# Patient Record
Sex: Female | Born: 1965 | Race: Black or African American | Hispanic: No | Marital: Married | Smoking: Former smoker
Health system: Southern US, Community
[De-identification: ages and names within clinical notes are randomized; demographics above are authoritative.]

## PROBLEM LIST (undated history)

## (undated) DIAGNOSIS — K219 Gastro-esophageal reflux disease without esophagitis: Secondary | ICD-10-CM

## (undated) DIAGNOSIS — H548 Legal blindness, as defined in USA: Secondary | ICD-10-CM

## (undated) DIAGNOSIS — C801 Malignant (primary) neoplasm, unspecified: Secondary | ICD-10-CM

## (undated) DIAGNOSIS — Q211 Atrial septal defect, unspecified: Secondary | ICD-10-CM

## (undated) DIAGNOSIS — M5126 Other intervertebral disc displacement, lumbar region: Secondary | ICD-10-CM

## (undated) DIAGNOSIS — M5430 Sciatica, unspecified side: Secondary | ICD-10-CM

## (undated) DIAGNOSIS — R112 Nausea with vomiting, unspecified: Secondary | ICD-10-CM

## (undated) DIAGNOSIS — F419 Anxiety disorder, unspecified: Secondary | ICD-10-CM

## (undated) DIAGNOSIS — G8929 Other chronic pain: Secondary | ICD-10-CM

## (undated) DIAGNOSIS — G43909 Migraine, unspecified, not intractable, without status migrainosus: Secondary | ICD-10-CM

## (undated) DIAGNOSIS — H269 Unspecified cataract: Secondary | ICD-10-CM

## (undated) DIAGNOSIS — R011 Cardiac murmur, unspecified: Secondary | ICD-10-CM

## (undated) DIAGNOSIS — E785 Hyperlipidemia, unspecified: Secondary | ICD-10-CM

## (undated) DIAGNOSIS — Z8719 Personal history of other diseases of the digestive system: Secondary | ICD-10-CM

## (undated) DIAGNOSIS — R519 Headache, unspecified: Secondary | ICD-10-CM

## (undated) DIAGNOSIS — R0600 Dyspnea, unspecified: Secondary | ICD-10-CM

## (undated) DIAGNOSIS — M1990 Unspecified osteoarthritis, unspecified site: Secondary | ICD-10-CM

## (undated) DIAGNOSIS — N189 Chronic kidney disease, unspecified: Secondary | ICD-10-CM

## (undated) DIAGNOSIS — Z9889 Other specified postprocedural states: Secondary | ICD-10-CM

## (undated) DIAGNOSIS — R51 Headache: Secondary | ICD-10-CM

## (undated) HISTORY — PX: CARDIAC CATHETERIZATION: SHX172

## (undated) HISTORY — PX: CATARACT EXTRACTION: SUR2

## (undated) HISTORY — PX: TRANSTHORACIC ECHOCARDIOGRAM: SHX275

## (undated) HISTORY — PX: BUNIONECTOMY: SHX129

## (undated) HISTORY — PX: CLEFT PALATE REPAIR: SUR1165

## (undated) HISTORY — DX: Unspecified cataract: H26.9

## (undated) HISTORY — PX: BREAST SURGERY: SHX581

## (undated) HISTORY — PX: TUBAL LIGATION: SHX77

## (undated) HISTORY — PX: CARDIAC SURGERY: SHX584

## (undated) HISTORY — PX: ABLATION: SHX5711

## (undated) SURGERY — Surgical Case
Anesthesia: *Unknown

---

## 2005-11-24 ENCOUNTER — Other Ambulatory Visit: Admission: RE | Admit: 2005-11-24 | Discharge: 2005-11-24 | Payer: Self-pay | Admitting: Family Medicine

## 2006-10-11 ENCOUNTER — Encounter: Admission: RE | Admit: 2006-10-11 | Discharge: 2006-10-11 | Payer: Self-pay | Admitting: Emergency Medicine

## 2007-12-05 ENCOUNTER — Encounter: Admission: RE | Admit: 2007-12-05 | Discharge: 2007-12-05 | Payer: Self-pay | Admitting: Orthopedic Surgery

## 2009-06-04 ENCOUNTER — Emergency Department (HOSPITAL_COMMUNITY): Admission: EM | Admit: 2009-06-04 | Discharge: 2009-06-04 | Payer: Self-pay | Admitting: Emergency Medicine

## 2010-06-27 ENCOUNTER — Encounter: Payer: Self-pay | Admitting: Emergency Medicine

## 2010-09-06 LAB — COMPREHENSIVE METABOLIC PANEL
ALT: 28 U/L (ref 0–35)
AST: 22 U/L (ref 0–37)
Albumin: 3.9 g/dL (ref 3.5–5.2)
CO2: 29 mEq/L (ref 19–32)
Calcium: 8.5 mg/dL (ref 8.4–10.5)
Chloride: 102 mEq/L (ref 96–112)
GFR calc Af Amer: >60 mL/min (ref >60)
GFR calc non Af Amer: >60 mL/min (ref >60)
Sodium: 138 mEq/L (ref 135–145)
Total Bilirubin: 0.6 mg/dL (ref 0.3–1.2)

## 2010-09-06 LAB — PROTIME-INR: Prothrombin Time: 12.5 seconds (ref 11.6–15.2)

## 2010-09-06 LAB — CBC
HCT: 37.4 % (ref 36.0–46.0)
MCV: 94.6 fL (ref 78.0–100.0)
RBC: 3.96 MIL/uL (ref 3.87–5.11)
WBC: 6.4 10*3/uL (ref 4.0–10.5)

## 2010-09-06 LAB — DIFFERENTIAL
Eosinophils Absolute: 0.1 10*3/uL (ref 0.0–0.7)
Eosinophils Relative: 1 % (ref 0–5)
Lymphs Abs: 2.3 10*3/uL (ref 0.7–4.0)
Monocytes Absolute: 0.3 10*3/uL (ref 0.1–1.0)

## 2011-01-21 ENCOUNTER — Emergency Department (HOSPITAL_COMMUNITY)
Admission: EM | Admit: 2011-01-21 | Discharge: 2011-01-21 | Disposition: A | Discharge status: Home / Self Care | Payer: Self-pay | Attending: Emergency Medicine | Admitting: Emergency Medicine

## 2011-01-21 DIAGNOSIS — H6690 Otitis media, unspecified, unspecified ear: Secondary | ICD-10-CM | POA: Insufficient documentation

## 2011-01-21 DIAGNOSIS — H9209 Otalgia, unspecified ear: Secondary | ICD-10-CM | POA: Insufficient documentation

## 2011-01-21 DIAGNOSIS — H9210 Otorrhea, unspecified ear: Secondary | ICD-10-CM | POA: Insufficient documentation

## 2011-01-21 DIAGNOSIS — H60399 Other infective otitis externa, unspecified ear: Secondary | ICD-10-CM | POA: Insufficient documentation

## 2012-06-05 ENCOUNTER — Other Ambulatory Visit: Payer: Self-pay | Admitting: *Deleted

## 2012-06-05 DIAGNOSIS — Z1231 Encounter for screening mammogram for malignant neoplasm of breast: Secondary | ICD-10-CM

## 2012-06-18 ENCOUNTER — Ambulatory Visit
Admission: RE | Admit: 2012-06-18 | Discharge: 2012-06-18 | Disposition: A | Discharge status: Home / Self Care | Payer: BC Managed Care – PPO | Source: Ambulatory Visit | Attending: *Deleted | Admitting: *Deleted

## 2012-06-18 DIAGNOSIS — Z1231 Encounter for screening mammogram for malignant neoplasm of breast: Secondary | ICD-10-CM

## 2012-10-01 ENCOUNTER — Other Ambulatory Visit: Payer: Self-pay | Admitting: Occupational Medicine

## 2012-10-01 ENCOUNTER — Ambulatory Visit: Payer: Self-pay

## 2012-10-01 DIAGNOSIS — M549 Dorsalgia, unspecified: Secondary | ICD-10-CM

## 2014-07-06 ENCOUNTER — Emergency Department (HOSPITAL_COMMUNITY): Payer: PRIVATE HEALTH INSURANCE

## 2014-07-06 ENCOUNTER — Encounter (HOSPITAL_COMMUNITY): Payer: Self-pay

## 2014-07-06 ENCOUNTER — Emergency Department (HOSPITAL_COMMUNITY)
Admission: EM | Admit: 2014-07-06 | Discharge: 2014-07-06 | Disposition: A | Discharge status: Home / Self Care | Payer: PRIVATE HEALTH INSURANCE | Attending: Emergency Medicine | Admitting: Emergency Medicine

## 2014-07-06 DIAGNOSIS — Y998 Other external cause status: Secondary | ICD-10-CM | POA: Insufficient documentation

## 2014-07-06 DIAGNOSIS — W1839XA Other fall on same level, initial encounter: Secondary | ICD-10-CM | POA: Diagnosis not present

## 2014-07-06 DIAGNOSIS — Y9289 Other specified places as the place of occurrence of the external cause: Secondary | ICD-10-CM | POA: Diagnosis not present

## 2014-07-06 DIAGNOSIS — Y9389 Activity, other specified: Secondary | ICD-10-CM | POA: Diagnosis not present

## 2014-07-06 DIAGNOSIS — Z720 Tobacco use: Secondary | ICD-10-CM | POA: Insufficient documentation

## 2014-07-06 DIAGNOSIS — S8992XA Unspecified injury of left lower leg, initial encounter: Secondary | ICD-10-CM | POA: Diagnosis not present

## 2014-07-06 MED ORDER — HYDROCODONE-ACETAMINOPHEN 5-325 MG PO TABS: 1.0000 | ORAL_TABLET | Freq: Four times a day (QID) | ORAL | Status: DC | PRN

## 2014-07-06 MED ORDER — NAPROXEN 500 MG PO TABS: 500.0000 mg | ORAL_TABLET | Freq: Two times a day (BID) | ORAL | Status: DC

## 2014-07-06 MED ORDER — ONDANSETRON HCL 4 MG PO TABS: 4.0000 mg | ORAL_TABLET | Freq: Four times a day (QID) | ORAL | Status: DC

## 2014-07-06 NOTE — ED Provider Notes (Signed)
CSN: [id number]     Arrival date & time 07/06/14  6010 History   First MD Initiated Contact with Patient 07/06/14 1007     Chief Complaint  Patient presents with  . Knee Pain     (Consider location/radiation/quality/duration/timing/severity/associated sxs/prior Treatment) HPI Comments: 49 y/o F presenting to the ED for evaluation of left knee pain. She slipped on a patch of ice in her driveway on Tuesday and felt a "pop" in the knee which caused her to fall to the ground. She did not hit her head and denies LOC. She was able to ambulate immediately following the fall with pain mainly in the medial aspect of the L knee. She has tried ice and Ibuprofen at home with minimal relief of her symptoms. Pt denies numbness, tingling, pain in the ankle/hip.    Patient is a 49 y.o. female presenting with knee pain. The history is provided by the patient.  Knee Pain   History reviewed. No pertinent past medical history. Past Surgical History  Procedure Laterality Date  . Breast surgery    . Cataract extraction    . Bunionectomy    . Cleft palate repair    . Cardiac surgery    . Ablation     History reviewed. No pertinent family history. History  Substance Use Topics  . Smoking status: Current Every Day Smoker  . Smokeless tobacco: Not on file  . Alcohol Use: No   OB History    No data available     Review of Systems    Allergies  Tramadol and Zithromax  Home Medications   Prior to Admission medications   Not on File   BP 132/75 mmHg  Pulse 104  Temp(Src) 98.3 F (36.8 C) (Oral)  Resp 20  SpO2 99% Physical Exam  ED Course  Procedures (including critical care time) Labs Review Labs Reviewed - No data to display  Imaging Review Dg Knee Complete 4 Views Left  07/06/2014   CLINICAL DATA:  Pain in left knee 6 days after fall; pt states that she fell due to ice on the ground, fell onto her left knee and felt the knee popped; no previous injury; pain on both lateral and  medial sides of the knee, pain radiates to hip and ankle sometime but not much per pt  EXAM: LEFT KNEE - COMPLETE 4+ VIEW  COMPARISON:  None.  FINDINGS: No fracture or dislocation.  There are no significant arthropathic changes.  No joint effusion.  Soft tissues are unremarkable.  IMPRESSION: Negative.   Electronically Signed   By: Lajean Manes M.D.   On: 07/06/2014 10:53     EKG Interpretation None      MDM   Final diagnoses:  Knee injury, left, initial encounter    4:55 PM BP 132/75 mmHg  Pulse 101  Temp(Src) 98.3 F (36.8 C) (Oral)  Resp 20  SpO2 100%  Patient X-Ray negative for obvious fracture or dislocation. Pain managed in ED. Pt advised to follow up with orthopedics if symptoms persist for possibility of missed fracture diagnosis. Patient given brace while in ED, conservative therapy recommended and discussed. Patient will be dc home & is agreeable with above plan.     Margarita Mail, PA-C 07/06/14 [place], MD 07/22/14 [place], MD 07/22/14 [phone]

## 2014-07-06 NOTE — Discharge Instructions (Signed)

## 2014-07-06 NOTE — ED Notes (Signed)
Per pt, was walking on Tuesday.  Felt pop in left knee.  Fell landing on same knee.  Has used ice and ibuprofen since.  Knee continues to hurt with ambulation and any other movement.

## 2014-07-07 ENCOUNTER — Encounter (HOSPITAL_BASED_OUTPATIENT_CLINIC_OR_DEPARTMENT_OTHER): Payer: Self-pay | Admitting: Emergency Medicine

## 2014-10-16 ENCOUNTER — Encounter (HOSPITAL_COMMUNITY): Payer: Self-pay | Admitting: Emergency Medicine

## 2014-10-16 ENCOUNTER — Emergency Department (INDEPENDENT_AMBULATORY_CARE_PROVIDER_SITE_OTHER)
Admission: EM | Admit: 2014-10-16 | Discharge: 2014-10-16 | Disposition: A | Discharge status: Home / Self Care | Payer: PRIVATE HEALTH INSURANCE | Source: Home / Self Care | Attending: Family Medicine | Admitting: Family Medicine

## 2014-10-16 DIAGNOSIS — F322 Major depressive disorder, single episode, severe without psychotic features: Secondary | ICD-10-CM

## 2014-10-16 MED ORDER — SERTRALINE HCL 25 MG PO TABS: 25.0000 mg | ORAL_TABLET | Freq: Every day | ORAL | Status: DC

## 2014-10-16 NOTE — ED Provider Notes (Signed)
Brandy [name] is a 49 y.o. female who presents to Urgent Care today for depression and anxiety. Patient has had worsening depression and anxiety over the past few months. She is reached a breaking point. She notes anxiety and feeling down and flat and worthless. She additionally notes low energy. This has never happened to her before. She denies any family history of bipolar disorder. She denies any SI or HI. She does not have a true primary care provider. No fevers or chills vomiting or diarrhea.   History reviewed. No pertinent past medical history. Past Surgical History  Procedure Laterality Date  . Breast surgery    . Cataract extraction    . Bunionectomy    . Cleft palate repair    . Cardiac surgery    . Ablation     History  Substance Use Topics  . Smoking status: Current Every Day Smoker  . Smokeless tobacco: Not on file  . Alcohol Use: No   ROS as above Medications: No current facility-administered medications for this encounter.   Current Outpatient Prescriptions  Medication Sig Dispense Refill  . HYDROcodone-acetaminophen (NORCO) 5-325 MG per tablet Take 1-2 tablets by mouth every 6 (six) hours as needed for moderate pain. 20 tablet 0  . naproxen (NAPROSYN) 500 MG tablet Take 1 tablet (500 mg total) by mouth 2 (two) times daily with a meal. 30 tablet 0  . ondansetron (ZOFRAN) 4 MG tablet Take 1 tablet (4 mg total) by mouth every 6 (six) hours. 12 tablet 0  . sertraline (ZOLOFT) 25 MG tablet Take 1 tablet (25 mg total) by mouth daily. 30 tablet 0   Allergies  Allergen Reactions  . Tramadol Other (See Comments)    Altered mental status  . Zithromax [Azithromycin]      Exam:  BP 136/76 mmHg  Pulse 66  Temp(Src) 97.6 F (36.4 C) (Oral)  Resp 16  SpO2 100% Gen: Well NAD Psych: Tearful affect. Normal thought process and speech. No SI or HI. No delusions or hallucinations expressed. PHQ 9 24. No suicidal ideation.  No results found for this or any previous  visit (from the past 24 hour(s)). No results found.  Assessment and Plan: 49 y.o. female with depression and anxiety. I scheduled patient with psychiatry for an appointment on June 7. In the meantime we'll start Zoloft 25 mg and increase to 50 mg. Follow-up with me in one week. Discussed crisis line.  Discussed warning signs or symptoms. Please see discharge instructions. Patient expresses understanding.     Gregor Hams, MD 10/16/14 [phone]

## 2014-10-16 NOTE — Discharge Instructions (Signed)
Thank you for coming in today. Start taking Zoloft 1 tablet daily. After few days increase to 2 tablets daily. Return on Wednesday Thursday or Friday for recheck. Come back sooner or go to [facility] behavioral health on [address] or call [phone] if you get worse.    Major Depressive Disorder Major depressive disorder is a mental illness. It also may be called clinical depression or unipolar depression. Major depressive disorder usually causes feelings of sadness, hopelessness, or helplessness. Some people with this disorder do not feel particularly sad but lose interest in doing things they used to enjoy (anhedonia). Major depressive disorder also can cause physical symptoms. It can interfere with work, school, relationships, and other normal everyday activities. The disorder varies in severity but is longer lasting and more serious than the sadness we all feel from time to time in our lives. Major depressive disorder often is triggered by stressful life events or major life changes. Examples of these triggers include divorce, loss of your job or home, a move, and the death of a family member or close friend. Sometimes this disorder occurs for no obvious reason at all. People who have family members with major depressive disorder or bipolar disorder are at higher risk for developing this disorder, with or without life stressors. Major depressive disorder can occur at any age. It may occur just once in your life (single episode major depressive disorder). It may occur multiple times (recurrent major depressive disorder). SYMPTOMS People with major depressive disorder have either anhedonia or depressed mood on nearly a daily basis for at least 2 weeks or longer. Symptoms of depressed mood include:  Feelings of sadness (blue or down in the dumps) or emptiness.  Feelings of hopelessness or helplessness.  Tearfulness or episodes of crying (may be observed by others).  Irritability  (children and adolescents). In addition to depressed mood or anhedonia or both, people with this disorder have at least four of the following symptoms:  Difficulty sleeping or sleeping too much.   Significant change (increase or decrease) in appetite or weight.   Lack of energy or motivation.  Feelings of guilt and worthlessness.   Difficulty concentrating, remembering, or making decisions.  Unusually slow movement (psychomotor retardation) or restlessness (as observed by others).   Recurrent wishes for death, recurrent thoughts of self-harm (suicide), or a suicide attempt. People with major depressive disorder commonly have persistent negative thoughts about themselves, other people, and the world. People with severe major depressive disorder may experiencedistorted beliefs or perceptions about the world (psychotic delusions). They also may see or hear things that are not real (psychotic hallucinations). DIAGNOSIS Major depressive disorder is diagnosed through an assessment by your health care provider. Your health care provider will ask aboutaspects of your daily life, such as mood,sleep, and appetite, to see if you have the diagnostic symptoms of major depressive disorder. Your health care provider may ask about your medical history and use of alcohol or drugs, including prescription medicines. Your health care provider also may do a physical exam and blood work. This is because certain medical conditions and the use of certain substances can cause major depressive disorder-like symptoms (secondary depression). Your health care provider also may refer you to a mental health specialist for further evaluation and treatment. TREATMENT It is important to recognize the symptoms of major depressive disorder and seek treatment. The following treatments can be prescribed for this disorder:   Medicine. Antidepressant medicines usually are prescribed. Antidepressant medicines are thought to  correct chemical imbalances in the brain that are commonly associated with major depressive disorder. Other types of medicine may be added if the symptoms do not respond to antidepressant medicines alone or if psychotic delusions or hallucinations occur.  Talk therapy. Talk therapy can be helpful in treating major depressive disorder by providing support, education, and guidance. Certain types of talk therapy also can help with negative thinking (cognitive behavioral therapy) and with relationship issues that trigger this disorder (interpersonal therapy). A mental health specialist can help determine which treatment is best for you. Most people with major depressive disorder do well with a combination of medicine and talk therapy. Treatments involving electrical stimulation of the brain can be used in situations with extremely severe symptoms or when medicine and talk therapy do not work over time. These treatments include electroconvulsive therapy, transcranial magnetic stimulation, and vagal nerve stimulation. Document Released: 09/17/2012 Document Revised: 10/07/2013 Document Reviewed: 09/17/2012 [facility] Patient Information 2015 [place], [place]. This information is not intended to replace advice given to you by your health care provider. Make sure you discuss any questions you have with your health care provider.

## 2014-10-16 NOTE — ED Notes (Signed)
C/o depression States she has been depress for months No tx tried Patient did cry trying to explain problems

## 2014-10-22 ENCOUNTER — Encounter (HOSPITAL_COMMUNITY): Payer: Self-pay

## 2014-10-22 ENCOUNTER — Emergency Department (INDEPENDENT_AMBULATORY_CARE_PROVIDER_SITE_OTHER)
Admission: EM | Admit: 2014-10-22 | Discharge: 2014-10-22 | Disposition: A | Discharge status: Home / Self Care | Payer: PRIVATE HEALTH INSURANCE | Source: Home / Self Care | Attending: Family Medicine | Admitting: Family Medicine

## 2014-10-22 DIAGNOSIS — F322 Major depressive disorder, single episode, severe without psychotic features: Secondary | ICD-10-CM | POA: Diagnosis not present

## 2014-10-22 MED ORDER — SERTRALINE HCL 100 MG PO TABS: 100.0000 mg | ORAL_TABLET | Freq: Every day | ORAL | Status: DC
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
## 2014-10-22 NOTE — Discharge Instructions (Signed)
Thank you for coming in today. Start taking 100mg  zoloft daily.  Follow up with your doctor or myself on or before Wednesday May 25th.  Return sooner if needed.  Go to the ER or call 911 if you feel like hurting yourself or others.    Major Depressive Disorder Major depressive disorder is a mental illness. It also may be called clinical depression or unipolar depression. Major depressive disorder usually causes feelings of sadness, hopelessness, or helplessness. Some people with this disorder do not feel particularly sad but lose interest in doing things they used to enjoy (anhedonia). Major depressive disorder also can cause physical symptoms. It can interfere with work, school, relationships, and other normal everyday activities. The disorder varies in severity but is longer lasting and more serious than the sadness we all feel from time to time in our lives. Major depressive disorder often is triggered by stressful life events or major life changes. Examples of these triggers include divorce, loss of your job or home, a move, and the death of a family member or close friend. Sometimes this disorder occurs for no obvious reason at all. People who have family members with major depressive disorder or bipolar disorder are at higher risk for developing this disorder, with or without life stressors. Major depressive disorder can occur at any age. It may occur just once in your life (single episode major depressive disorder). It may occur multiple times (recurrent major depressive disorder). SYMPTOMS People with major depressive disorder have either anhedonia or depressed mood on nearly a daily basis for at least 2 weeks or longer. Symptoms of depressed mood include:  Feelings of sadness (blue or down in the dumps) or emptiness.  Feelings of hopelessness or helplessness.  Tearfulness or episodes of crying (may be observed by others).  Irritability (children and adolescents). In addition to depressed  mood or anhedonia or both, people with this disorder have at least four of the following symptoms:  Difficulty sleeping or sleeping too much.   Significant change (increase or decrease) in appetite or weight.   Lack of energy or motivation.  Feelings of guilt and worthlessness.   Difficulty concentrating, remembering, or making decisions.  Unusually slow movement (psychomotor retardation) or restlessness (as observed by others).   Recurrent wishes for death, recurrent thoughts of self-harm (suicide), or a suicide attempt. People with major depressive disorder commonly have persistent negative thoughts about themselves, other people, and the world. People with severe major depressive disorder may experiencedistorted beliefs or perceptions about the world (psychotic delusions). They also may see or hear things that are not real (psychotic hallucinations). DIAGNOSIS Major depressive disorder is diagnosed through an assessment by your health care provider. Your health care provider will ask aboutaspects of your daily life, such as mood,sleep, and appetite, to see if you have the diagnostic symptoms of major depressive disorder. Your health care provider may ask about your medical history and use of alcohol or drugs, including prescription medicines. Your health care provider also may do a physical exam and blood work. This is because certain medical conditions and the use of certain substances can cause major depressive disorder-like symptoms (secondary depression). Your health care provider also may refer you to a mental health specialist for further evaluation and treatment. TREATMENT It is important to recognize the symptoms of major depressive disorder and seek treatment. The following treatments can be prescribed for this disorder:   Medicine. Antidepressant medicines usually are prescribed. Antidepressant medicines are thought to correct chemical imbalances in the  brain that are  commonly associated with major depressive disorder. Other types of medicine may be added if the symptoms do not respond to antidepressant medicines alone or if psychotic delusions or hallucinations occur.  Talk therapy. Talk therapy can be helpful in treating major depressive disorder by providing support, education, and guidance. Certain types of talk therapy also can help with negative thinking (cognitive behavioral therapy) and with relationship issues that trigger this disorder (interpersonal therapy). A mental health specialist can help determine which treatment is best for you. Most people with major depressive disorder do well with a combination of medicine and talk therapy. Treatments involving electrical stimulation of the brain can be used in situations with extremely severe symptoms or when medicine and talk therapy do not work over time. These treatments include electroconvulsive therapy, transcranial magnetic stimulation, and vagal nerve stimulation. Document Released: 09/17/2012 Document Revised: 10/07/2013 Document Reviewed: 09/17/2012 [facility] Patient Information 2015 [place], [place]. This information is not intended to replace advice given to you by your health care provider. Make sure you discuss any questions you have with your health care provider.

## 2014-10-31 ENCOUNTER — Telehealth (HOSPITAL_COMMUNITY): Payer: Self-pay | Admitting: Family Medicine

## 2014-10-31 NOTE — ED Notes (Signed)
Short Term Disability Form filled out.  Left at front desk.  I left a message for the patient asking her to pick up the form.   Gregor Hams, MD 10/31/14 [phone]

## 2014-11-11 ENCOUNTER — Telehealth (HOSPITAL_COMMUNITY): Payer: Self-pay

## 2014-11-11 ENCOUNTER — Ambulatory Visit (HOSPITAL_COMMUNITY): Payer: Self-pay | Admitting: Psychiatry

## 2014-11-11 NOTE — Telephone Encounter (Signed)
Patient came in late, was not able to be seen.  Told patient it would be August before she can get in.  She stated "she was not waiting til august to be seen" and left.

## 2014-11-20 ENCOUNTER — Encounter (HOSPITAL_COMMUNITY): Payer: Self-pay | Admitting: Emergency Medicine

## 2014-11-20 ENCOUNTER — Emergency Department (INDEPENDENT_AMBULATORY_CARE_PROVIDER_SITE_OTHER)
Admission: EM | Admit: 2014-11-20 | Discharge: 2014-11-20 | Disposition: A | Discharge status: Home / Self Care | Payer: Self-pay | Source: Home / Self Care

## 2014-11-20 DIAGNOSIS — F329 Major depressive disorder, single episode, unspecified: Secondary | ICD-10-CM

## 2014-11-20 DIAGNOSIS — F32A Depression, unspecified: Secondary | ICD-10-CM

## 2014-11-20 NOTE — ED Notes (Signed)
Pt is here to follow up on her depression and talk about depression medications

## 2014-11-20 NOTE — ED Provider Notes (Signed)
CSN: [id number]     Arrival date & time 11/20/14  1737 History   None    Chief Complaint  Patient presents with  . Follow-up   (Consider location/radiation/quality/duration/timing/severity/associated sxs/prior Treatment) HPI Comments: Patient presents today to obtain office notes from Dr. Clovis Riley 2 past office visit. Her employer did not approve FMLA because of notes. Her depression is better, but she would like to come off Zoloft and do "something else". She has no suicidal tendencies or reports no harm to others. She was late to Psych appt and they would not see her. She is going to find somewhere else to go.   The history is provided by the patient.    History reviewed. No pertinent past medical history. Past Surgical History  Procedure Laterality Date  . Breast surgery    . Cataract extraction    . Bunionectomy    . Cleft palate repair    . Cardiac surgery    . Ablation     History reviewed. No pertinent family history. History  Substance Use Topics  . Smoking status: Current Every Day Smoker  . Smokeless tobacco: Not on file  . Alcohol Use: No   OB History    No data available     Review of Systems  All other systems reviewed and are negative.   Allergies  Tramadol and Zithromax  Home Medications   Prior to Admission medications   Medication Sig Start Date End Date Taking? Authorizing Provider  sertraline (ZOLOFT) 100 MG tablet Take 1 tablet (100 mg total) by mouth daily. Patient taking differently: Take 100 mg by mouth 2 (two) times daily.  10/22/14   Gregor Hams, MD   BP 123/80 mmHg  Pulse 99  Temp(Src) 98.1 F (36.7 C) (Oral)  Resp 16  SpO2 97% Physical Exam  Constitutional: She appears well-developed and well-nourished.  Smiling today  Psychiatric: Her behavior is normal.  Nursing note and vitals reviewed.   ED Course  Procedures (including critical care time) Labs Review Labs Reviewed - No data to display  Imaging Review No results  found.   MDM   1. Depression    Improved yet wants to try something different. Explained she will need a regular PCP or psych to do this as she must be tapered off Zoloft and this needs to be followed. Otherwise I did provide her with Dr. Clovis Riley' last office notes. In my basic assesment she appears happy and not a threat to herself or others.     Bjorn Pippin, PA-C 11/20/14 [phone]

## 2014-11-20 NOTE — Discharge Instructions (Signed)

## 2015-01-07 DIAGNOSIS — F419 Anxiety disorder, unspecified: Secondary | ICD-10-CM | POA: Insufficient documentation

## 2015-02-13 ENCOUNTER — Encounter (HOSPITAL_COMMUNITY): Payer: Self-pay | Admitting: Emergency Medicine

## 2015-02-13 ENCOUNTER — Emergency Department (INDEPENDENT_AMBULATORY_CARE_PROVIDER_SITE_OTHER)
Admission: EM | Admit: 2015-02-13 | Discharge: 2015-02-13 | Disposition: A | Discharge status: Home / Self Care | Payer: Self-pay | Source: Home / Self Care | Attending: Family Medicine | Admitting: Family Medicine

## 2015-02-13 DIAGNOSIS — M62838 Other muscle spasm: Secondary | ICD-10-CM

## 2015-02-13 DIAGNOSIS — M5431 Sciatica, right side: Secondary | ICD-10-CM

## 2015-02-13 MED ORDER — DEXAMETHASONE 4 MG PO TABS
ORAL_TABLET | ORAL | Status: AC
  Filled 2015-02-13: qty 2

## 2015-02-13 MED ORDER — DICLOFENAC SODIUM 75 MG PO TBEC: 75.0000 mg | DELAYED_RELEASE_TABLET | Freq: Two times a day (BID) | ORAL | Status: DC

## 2015-02-13 MED ORDER — METHOCARBAMOL 500 MG PO TABS: 500.0000 mg | ORAL_TABLET | Freq: Four times a day (QID) | ORAL | Status: DC | PRN

## 2015-02-13 MED ORDER — DEXAMETHASONE 2 MG PO TABS
ORAL_TABLET | ORAL | Status: AC
  Filled 2015-02-13: qty 1

## 2015-02-13 MED ORDER — DEXAMETHASONE 4 MG PO TABS
10.0000 mg | ORAL_TABLET | Freq: Once | ORAL | Status: AC
  Administered 2015-02-13: 10 mg via ORAL

## 2015-02-13 MED ORDER — METHYLPREDNISOLONE 4 MG PO TBPK: ORAL_TABLET | ORAL | Status: DC

## 2015-02-13 NOTE — ED Notes (Signed)
C/o sciatic pain flare up on right side onset 2-3 weeks after sustaining multiple falls Pain increases w/activity Alert... No acute distress.

## 2015-02-13 NOTE — Discharge Instructions (Signed)
You have sciatica You were given a dose of steroid to help with your symptoms Please continue the steroids and start the exercises Please follow up with orthopedic surgery as needed Please start the muscle relaxer and voltaren for additional relief.   Sciatica with Rehab The sciatic nerve runs from the back down the leg and is responsible for sensation and control of the muscles in the back (posterior) side of the thigh, lower leg, and foot. Sciatica is a condition that is characterized by inflammation of this nerve.  SYMPTOMS   Signs of nerve damage, including numbness and/or weakness along the posterior side of the lower extremity.  Pain in the back of the thigh that may also travel down the leg.  Pain that worsens when sitting for long periods of time.  Occasionally, pain in the back or buttock. CAUSES  Inflammation of the sciatic nerve is the cause of sciatica. The inflammation is due to something irritating the nerve. Common sources of irritation include:  Sitting for long periods of time.  Direct trauma to the nerve.  Arthritis of the spine.  Herniated or ruptured disk.  Slipping of the vertebrae (spondylolisthesis).  Pressure from soft tissues, such as muscles or ligament-like tissue (fascia). RISK INCREASES WITH:  Sports that place pressure or stress on the spine (football or weightlifting).  Poor strength and flexibility.  Failure to warm up properly before activity.  Family history of low back pain or disk disorders.  Previous back injury or surgery.  Poor body mechanics, especially when lifting, or poor posture. PREVENTION   Warm up and stretch properly before activity.  Maintain physical fitness:  Strength, flexibility, and endurance.  Cardiovascular fitness.  Learn and use proper technique, especially with posture and lifting. When possible, have coach correct improper technique.  Avoid activities that place stress on the spine. PROGNOSIS If  treated properly, then sciatica usually resolves within 6 weeks. However, occasionally surgery is necessary.  RELATED COMPLICATIONS   Permanent nerve damage, including pain, numbness, tingle, or weakness.  Chronic back pain.  Risks of surgery: infection, bleeding, nerve damage, or damage to surrounding tissues. TREATMENT Treatment initially involves resting from any activities that aggravate your symptoms. The use of ice and medication may help reduce pain and inflammation. The use of strengthening and stretching exercises may help reduce pain with activity. These exercises may be performed at home or with referral to a therapist. A therapist may recommend further treatments, such as transcutaneous electronic nerve stimulation (TENS) or ultrasound. Your caregiver may recommend corticosteroid injections to help reduce inflammation of the sciatic nerve. If symptoms persist despite non-surgical (conservative) treatment, then surgery may be recommended. MEDICATION  If pain medication is necessary, then nonsteroidal anti-inflammatory medications, such as aspirin and ibuprofen, or other minor pain relievers, such as acetaminophen, are often recommended.  Do not take pain medication for 7 days before surgery.  Prescription pain relievers may be given if deemed necessary by your caregiver. Use only as directed and only as much as you need.  Ointments applied to the skin may be helpful.  Corticosteroid injections may be given by your caregiver. These injections should be reserved for the most serious cases, because they may only be given a certain number of times. HEAT AND COLD  Cold treatment (icing) relieves pain and reduces inflammation. Cold treatment should be applied for 10 to 15 minutes every 2 to 3 hours for inflammation and pain and immediately after any activity that aggravates your symptoms. Use ice packs or massage  the area with a piece of ice (ice massage).  Heat treatment may be used  prior to performing the stretching and strengthening activities prescribed by your caregiver, physical therapist, or athletic trainer. Use a heat pack or soak the injury in warm water. SEEK MEDICAL CARE IF:  Treatment seems to offer no benefit, or the condition worsens.  Any medications produce adverse side effects. EXERCISES  RANGE OF MOTION (ROM) AND STRETCHING EXERCISES - Sciatica Most people with sciatic will find that their symptoms worsen with either excessive bending forward (flexion) or arching at the low back (extension). The exercises which will help resolve your symptoms will focus on the opposite motion. Your physician, physical therapist or athletic trainer will help you determine which exercises will be most helpful to resolve your low back pain. Do not complete any exercises without first consulting with your clinician. Discontinue any exercises which worsen your symptoms until you speak to your clinician. If you have pain, numbness or tingling which travels down into your buttocks, leg or foot, the goal of the therapy is for these symptoms to move closer to your back and eventually resolve. Occasionally, these leg symptoms will get better, but your low back pain may worsen; this is typically an indication of progress in your rehabilitation. Be certain to be very alert to any changes in your symptoms and the activities in which you participated in the 24 hours prior to the change. Sharing this information with your clinician will allow him/her to most efficiently treat your condition. These exercises may help you when beginning to rehabilitate your injury. Your symptoms may resolve with or without further involvement from your physician, physical therapist or athletic trainer. While completing these exercises, remember:   Restoring tissue flexibility helps normal motion to return to the joints. This allows healthier, less painful movement and activity.  An effective stretch should be held  for at least 30 seconds.  A stretch should never be painful. You should only feel a gentle lengthening or release in the stretched tissue. FLEXION RANGE OF MOTION AND STRETCHING EXERCISES: STRETCH - Flexion, Single Knee to Chest   Lie on a firm bed or floor with both legs extended in front of you.  Keeping one leg in contact with the floor, bring your opposite knee to your chest. Hold your leg in place by either grabbing behind your thigh or at your knee.  Pull until you feel a gentle stretch in your low back. Hold __________ seconds.  Slowly release your grasp and repeat the exercise with the opposite side. Repeat __________ times. Complete this exercise __________ times per day.  STRETCH - Flexion, Double Knee to Chest  Lie on a firm bed or floor with both legs extended in front of you.  Keeping one leg in contact with the floor, bring your opposite knee to your chest.  Tense your stomach muscles to support your back and then lift your other knee to your chest. Hold your legs in place by either grabbing behind your thighs or at your knees.  Pull both knees toward your chest until you feel a gentle stretch in your low back. Hold __________ seconds.  Tense your stomach muscles and slowly return one leg at a time to the floor. Repeat __________ times. Complete this exercise __________ times per day.  STRETCH - Low Trunk Rotation   Lie on a firm bed or floor. Keeping your legs in front of you, bend your knees so they are both pointed toward the ceiling  and your feet are flat on the floor.  Extend your arms out to the side. This will stabilize your upper body by keeping your shoulders in contact with the floor.  Gently and slowly drop both knees together to one side until you feel a gentle stretch in your low back. Hold for __________ seconds.  Tense your stomach muscles to support your low back as you bring your knees back to the starting position. Repeat the exercise to the other  side. Repeat __________ times. Complete this exercise __________ times per day  EXTENSION RANGE OF MOTION AND FLEXIBILITY EXERCISES: STRETCH - Extension, Prone on Elbows  Lie on your stomach on the floor, a bed will be too soft. Place your palms about shoulder width apart and at the height of your head.  Place your elbows under your shoulders. If this is too painful, stack pillows under your chest.  Allow your body to relax so that your hips drop lower and make contact more completely with the floor.  Hold this position for __________ seconds.  Slowly return to lying flat on the floor. Repeat __________ times. Complete this exercise __________ times per day.  RANGE OF MOTION - Extension, Prone Press Ups  Lie on your stomach on the floor, a bed will be too soft. Place your palms about shoulder width apart and at the height of your head.  Keeping your back as relaxed as possible, slowly straighten your elbows while keeping your hips on the floor. You may adjust the placement of your hands to maximize your comfort. As you gain motion, your hands will come more underneath your shoulders.  Hold this position __________ seconds.  Slowly return to lying flat on the floor. Repeat __________ times. Complete this exercise __________ times per day.  STRENGTHENING EXERCISES - Sciatica  These exercises may help you when beginning to rehabilitate your injury. These exercises should be done near your "sweet spot." This is the neutral, low-back arch, somewhere between fully rounded and fully arched, that is your least painful position. When performed in this safe range of motion, these exercises can be used for people who have either a flexion or extension based injury. These exercises may resolve your symptoms with or without further involvement from your physician, physical therapist or athletic trainer. While completing these exercises, remember:   Muscles can gain both the endurance and the strength  needed for everyday activities through controlled exercises.  Complete these exercises as instructed by your physician, physical therapist or athletic trainer. Progress with the resistance and repetition exercises only as your caregiver advises.  You may experience muscle soreness or fatigue, but the pain or discomfort you are trying to eliminate should never worsen during these exercises. If this pain does worsen, stop and make certain you are following the directions exactly. If the pain is still present after adjustments, discontinue the exercise until you can discuss the trouble with your clinician. STRENGTHENING - Deep Abdominals, Pelvic Tilt   Lie on a firm bed or floor. Keeping your legs in front of you, bend your knees so they are both pointed toward the ceiling and your feet are flat on the floor.  Tense your lower abdominal muscles to press your low back into the floor. This motion will rotate your pelvis so that your tail bone is scooping upwards rather than pointing at your feet or into the floor.  With a gentle tension and even breathing, hold this position for __________ seconds. Repeat __________ times. Complete this exercise  __________ times per day.  STRENGTHENING - Abdominals, Crunches   Lie on a firm bed or floor. Keeping your legs in front of you, bend your knees so they are both pointed toward the ceiling and your feet are flat on the floor. Cross your arms over your chest.  Slightly tip your chin down without bending your neck.  Tense your abdominals and slowly lift your trunk high enough to just clear your shoulder blades. Lifting higher can put excessive stress on the low back and does not further strengthen your abdominal muscles.  Control your return to the starting position. Repeat __________ times. Complete this exercise __________ times per day.  STRENGTHENING - Quadruped, Opposite UE/LE Lift  Assume a hands and knees position on a firm surface. Keep your hands  under your shoulders and your knees under your hips. You may place padding under your knees for comfort.  Find your neutral spine and gently tense your abdominal muscles so that you can maintain this position. Your shoulders and hips should form a rectangle that is parallel with the floor and is not twisted.  Keeping your trunk steady, lift your right hand no higher than your shoulder and then your left leg no higher than your hip. Make sure you are not holding your breath. Hold this position __________ seconds.  Continuing to keep your abdominal muscles tense and your back steady, slowly return to your starting position. Repeat with the opposite arm and leg. Repeat __________ times. Complete this exercise __________ times per day.  STRENGTHENING - Abdominals and Quadriceps, Straight Leg Raise   Lie on a firm bed or floor with both legs extended in front of you.  Keeping one leg in contact with the floor, bend the other knee so that your foot can rest flat on the floor.  Find your neutral spine, and tense your abdominal muscles to maintain your spinal position throughout the exercise.  Slowly lift your straight leg off the floor about 6 inches for a count of 15, making sure to not hold your breath.  Still keeping your neutral spine, slowly lower your leg all the way to the floor. Repeat this exercise with each leg __________ times. Complete this exercise __________ times per day. POSTURE AND BODY MECHANICS CONSIDERATIONS - Sciatica Keeping correct posture when sitting, standing or completing your activities will reduce the stress put on different body tissues, allowing injured tissues a chance to heal and limiting painful experiences. The following are general guidelines for improved posture. Your physician or physical therapist will provide you with any instructions specific to your needs. While reading these guidelines, remember:  The exercises prescribed by your provider will help you have  the flexibility and strength to maintain correct postures.  The correct posture provides the optimal environment for your joints to work. All of your joints have less wear and tear when properly supported by a spine with good posture. This means you will experience a healthier, less painful body.  Correct posture must be practiced with all of your activities, especially prolonged sitting and standing. Correct posture is as important when doing repetitive low-stress activities (typing) as it is when doing a single heavy-load activity (lifting). RESTING POSITIONS Consider which positions are most painful for you when choosing a resting position. If you have pain with flexion-based activities (sitting, bending, stooping, squatting), choose a position that allows you to rest in a less flexed posture. You would want to avoid curling into a fetal position on your side. If your  pain worsens with extension-based activities (prolonged standing, working overhead), avoid resting in an extended position such as sleeping on your stomach. Most people will find more comfort when they rest with their spine in a more neutral position, neither too rounded nor too arched. Lying on a non-sagging bed on your side with a pillow between your knees, or on your back with a pillow under your knees will often provide some relief. Keep in mind, being in any one position for a prolonged period of time, no matter how correct your posture, can still lead to stiffness. PROPER SITTING POSTURE In order to minimize stress and discomfort on your spine, you must sit with correct posture Sitting with good posture should be effortless for a healthy body. Returning to good posture is a gradual process. Many people can work toward this most comfortably by using various supports until they have the flexibility and strength to maintain this posture on their own. When sitting with proper posture, your ears will fall over your shoulders and your  shoulders will fall over your hips. You should use the back of the chair to support your upper back. Your low back will be in a neutral position, just slightly arched. You may place a small pillow or folded towel at the base of your low back for support.  When working at a desk, create an environment that supports good, upright posture. Without extra support, muscles fatigue and lead to excessive strain on joints and other tissues. Keep these recommendations in mind: CHAIR:   A chair should be able to slide under your desk when your back makes contact with the back of the chair. This allows you to work closely.  The chair's height should allow your eyes to be level with the upper part of your monitor and your hands to be slightly lower than your elbows. BODY POSITION  Your feet should make contact with the floor. If this is not possible, use a foot rest.  Keep your ears over your shoulders. This will reduce stress on your neck and low back. INCORRECT SITTING POSTURES   If you are feeling tired and unable to assume a healthy sitting posture, do not slouch or slump. This puts excessive strain on your back tissues, causing more damage and pain. Healthier options include:  Using more support, like a lumbar pillow.  Switching tasks to something that requires you to be upright or walking.  Talking a brief walk.  Lying down to rest in a neutral-spine position. PROLONGED STANDING WHILE SLIGHTLY LEANING FORWARD  When completing a task that requires you to lean forward while standing in one place for a long time, place either foot up on a stationary 2-4 inch high object to help maintain the best posture. When both feet are on the ground, the low back tends to lose its slight inward curve. If this curve flattens (or becomes too large), then the back and your other joints will experience too much stress, fatigue more quickly and can cause pain.  CORRECT STANDING POSTURES Proper standing posture should  be assumed with all daily activities, even if they only take a few moments, like when brushing your teeth. As in sitting, your ears should fall over your shoulders and your shoulders should fall over your hips. You should keep a slight tension in your abdominal muscles to brace your spine. Your tailbone should point down to the ground, not behind your body, resulting in an over-extended swayback posture.  INCORRECT STANDING POSTURES  Common  incorrect standing postures include a forward head, locked knees and/or an excessive swayback. WALKING Walk with an upright posture. Your ears, shoulders and hips should all line-up. PROLONGED ACTIVITY IN A FLEXED POSITION When completing a task that requires you to bend forward at your waist or lean over a low surface, try to find a way to stabilize 3 of 4 of your limbs. You can place a hand or elbow on your thigh or rest a knee on the surface you are reaching across. This will provide you more stability so that your muscles do not fatigue as quickly. By keeping your knees relaxed, or slightly bent, you will also reduce stress across your low back. CORRECT LIFTING TECHNIQUES DO :   Assume a wide stance. This will provide you more stability and the opportunity to get as close as possible to the object which you are lifting.  Tense your abdominals to brace your spine; then bend at the knees and hips. Keeping your back locked in a neutral-spine position, lift using your leg muscles. Lift with your legs, keeping your back straight.  Test the weight of unknown objects before attempting to lift them.  Try to keep your elbows locked down at your sides in order get the best strength from your shoulders when carrying an object.  Always ask for help when lifting heavy or awkward objects. INCORRECT LIFTING TECHNIQUES DO NOT:   Lock your knees when lifting, even if it is a small object.  Bend and twist. Pivot at your feet or move your feet when needing to change  directions.  Assume that you cannot safely pick up a paperclip without proper posture. Document Released: 05/23/2005 Document Revised: 10/07/2013 Document Reviewed: 09/04/2008 [facility] Patient Information 2015 [place], [place]. This information is not intended to replace advice given to you by your health care provider. Make sure you discuss any questions you have with your health care provider.

## 2015-02-13 NOTE — ED Provider Notes (Signed)
CSN: [id number]     Arrival date & time 02/13/15  1543 History   First MD Initiated Contact with Patient 02/13/15 1610     Chief Complaint  Patient presents with  . Back Pain   (Consider location/radiation/quality/duration/timing/severity/associated sxs/prior Treatment) HPI  Right lower back pain. Started approximately 3 weeks ago as more of a dull ache after patient tried cleaning out her chest freezer and had to throw away a large amount of bad food. She states that she had a hard time lifting the bag of food and had the chest. Overall her. She is used extra strength Tylenol without significant improvement. There some radiation down the right leg but has been fairly manageable until today when patient states that she was picking up a heavy child/family member when she felt her lower back become acutely more painful with radiation down the right leg. Denies any saddle anesthesia, loss of bowel or bladder function, fevers, nausea, vomiting, dull pain, constipation. Pain is constant and worse with certain movements. Heat without improvement.  History reviewed. No pertinent past medical history. Past Surgical History  Procedure Laterality Date  . Breast surgery    . Cataract extraction    . Bunionectomy    . Cleft palate repair    . Cardiac surgery    . Ablation     No family history on file. Social History  Substance Use Topics  . Smoking status: Current Every Day Smoker  . Smokeless tobacco: None  . Alcohol Use: No   OB History    No data available     Review of Systems Per HPI with all other pertinent systems negative.   Allergies  Tramadol and Zithromax  Home Medications   Prior to Admission medications   Medication Sig Start Date End Date Taking? Authorizing Provider  diclofenac (VOLTAREN) 75 MG EC tablet Take 1 tablet (75 mg total) by mouth 2 (two) times daily. 02/13/15   Waldemar Dickens, MD  methocarbamol (ROBAXIN) 500 MG tablet Take 1-2 tablets (500-1,000 mg total) by  mouth every 6 (six) hours as needed for muscle spasms. 02/13/15   Waldemar Dickens, MD  methylPREDNISolone (MEDROL DOSEPAK) 4 MG TBPK tablet Take as prescribed 02/13/15   Waldemar Dickens, MD  sertraline (ZOLOFT) 100 MG tablet Take 1 tablet (100 mg total) by mouth daily. Patient taking differently: Take 100 mg by mouth 2 (two) times daily.  10/22/14   Gregor Hams, MD   Meds Ordered and Administered this Visit   Medications  dexamethasone (DECADRON) tablet 10 mg (not administered)    BP 124/80 mmHg  Pulse 89  Temp(Src) 98.1 F (36.7 C) (Oral)  Resp 16  SpO2 97% No data found.   Physical Exam Physical Exam  Constitutional: oriented to person, place, and time. appears well-developed and well-nourished. No distress.  HENT:  Head: Normocephalic and atraumatic.  Eyes: EOMI. PERRL.  Neck: Normal range of motion.  Cardiovascular: RRR, no m/r/g, 2+ distal pulses,  Pulmonary/Chest: Effort normal and breath sounds normal. No respiratory distress.  Abdominal: Soft. Bowel sounds are normal. NonTTP, no distension.  Musculoskeletal: Patient with limited range of motion of back due to complaints of pain. Mild soft tissue tenderness in the right lower act to upper gluteal region.  Neurological: alert and oriented to person, place, and time.  Skin: Skin is warm. No rash noted. non diaphoretic.  Psychiatric: normal mood and affect. behavior is normal. Judgment and thought content normal.   ED Course  Procedures (including critical  care time)  Labs Review Labs Reviewed - No data to display  Imaging Review No results found.   Visual Acuity Review  Right Eye Distance:   Left Eye Distance:   Bilateral Distance:    Right Eye Near:   Left Eye Near:    Bilateral Near:         MDM   1. Sciatica, right   2. Muscle spasm    Decadron 10 mg by mouth given in clinic. Start Voltaren after finishing steroids. Medrol Dosepak prescribed. Robaxin for additional muscle strain relief. Robaxin for  muscle strain. Start sciatica exercises. Go to ED if gets worse.    Waldemar Dickens, MD 02/13/15 [phone]

## 2015-02-22 ENCOUNTER — Encounter (HOSPITAL_COMMUNITY): Payer: Self-pay | Admitting: Emergency Medicine

## 2015-02-22 ENCOUNTER — Emergency Department (HOSPITAL_COMMUNITY)
Admission: EM | Admit: 2015-02-22 | Discharge: 2015-02-22 | Disposition: A | Discharge status: Home / Self Care | Payer: Self-pay | Attending: Emergency Medicine | Admitting: Emergency Medicine

## 2015-02-22 DIAGNOSIS — Z79899 Other long term (current) drug therapy: Secondary | ICD-10-CM | POA: Insufficient documentation

## 2015-02-22 DIAGNOSIS — H9190 Unspecified hearing loss, unspecified ear: Secondary | ICD-10-CM | POA: Insufficient documentation

## 2015-02-22 DIAGNOSIS — R42 Dizziness and giddiness: Secondary | ICD-10-CM

## 2015-02-22 DIAGNOSIS — N390 Urinary tract infection, site not specified: Secondary | ICD-10-CM

## 2015-02-22 DIAGNOSIS — H5441 Blindness, right eye, normal vision left eye: Secondary | ICD-10-CM | POA: Insufficient documentation

## 2015-02-22 DIAGNOSIS — Z720 Tobacco use: Secondary | ICD-10-CM | POA: Insufficient documentation

## 2015-02-22 LAB — CBC
HEMATOCRIT: 40.5 % (ref 36.0–46.0)
HEMOGLOBIN: 13.3 g/dL (ref 12.0–15.0)
MCH: 31.2 pg (ref 26.0–34.0)
MCHC: 32.8 g/dL (ref 30.0–36.0)
MCV: 95.1 fL (ref 78.0–100.0)
Platelets: 307 10*3/uL (ref 150–400)
RBC: 4.26 MIL/uL (ref 3.87–5.11)
RDW: 14.6 % (ref 11.5–15.5)
WBC: 8.3 10*3/uL (ref 4.0–10.5)

## 2015-02-22 LAB — CBG MONITORING, ED: GLUCOSE-CAPILLARY: 107 mg/dL — AB (ref 65–99)

## 2015-02-22 LAB — COMPREHENSIVE METABOLIC PANEL
ALBUMIN: 4 g/dL (ref 3.5–5.0)
ALT: 94 U/L — ABNORMAL HIGH (ref 14–54)
ANION GAP: 7 (ref 5–15)
AST: 49 U/L — ABNORMAL HIGH (ref 15–41)
Alkaline Phosphatase: 77 U/L (ref 38–126)
BILIRUBIN TOTAL: 0.6 mg/dL (ref 0.3–1.2)
BUN: 18 mg/dL (ref 6–20)
CO2: 28 mmol/L (ref 22–32)
Calcium: 9 mg/dL (ref 8.9–10.3)
Chloride: 102 mmol/L (ref 101–111)
Creatinine, Ser: 0.91 mg/dL (ref 0.44–1.00)
GFR calc Af Amer: >60 mL/min (ref >60)
GFR calc non Af Amer: >60 mL/min (ref >60)
GLUCOSE: 104 mg/dL — AB (ref 65–99)
POTASSIUM: 4.6 mmol/L (ref 3.5–5.1)
SODIUM: 137 mmol/L (ref 135–145)
TOTAL PROTEIN: 7.6 g/dL (ref 6.5–8.1)

## 2015-02-22 LAB — URINE MICROSCOPIC-ADD ON

## 2015-02-22 LAB — URINALYSIS, ROUTINE W REFLEX MICROSCOPIC
Bilirubin Urine: NEGATIVE
Glucose, UA: NEGATIVE mg/dL
Ketones, ur: NEGATIVE mg/dL
NITRITE: NEGATIVE
Protein, ur: NEGATIVE mg/dL
SPECIFIC GRAVITY, URINE: 1.023 (ref 1.005–1.030)
UROBILINOGEN UA: 1 mg/dL (ref 0.0–1.0)
pH: 6 (ref 5.0–8.0)

## 2015-02-22 LAB — LIPASE, BLOOD: Lipase: 33 U/L (ref 22–51)

## 2015-02-22 LAB — TROPONIN I

## 2015-02-22 MED ORDER — CEPHALEXIN 500 MG PO CAPS: 500.0000 mg | ORAL_CAPSULE | Freq: Four times a day (QID) | ORAL | Status: DC

## 2015-02-22 MED ORDER — SODIUM CHLORIDE 0.9 % IV BOLUS (SEPSIS)
1000.0000 mL | Freq: Once | INTRAVENOUS | Status: AC
  Administered 2015-02-22: 1000 mL via INTRAVENOUS

## 2015-02-22 MED ORDER — MECLIZINE HCL 25 MG PO TABS
25.0000 mg | ORAL_TABLET | Freq: Once | ORAL | Status: AC
  Administered 2015-02-22: 25 mg via ORAL
  Filled 2015-02-22: qty 1

## 2015-02-22 MED ORDER — ONDANSETRON HCL 4 MG/2ML IJ SOLN
4.0000 mg | Freq: Once | INTRAMUSCULAR | Status: AC
  Administered 2015-02-22: 4 mg via INTRAVENOUS
  Filled 2015-02-22: qty 2

## 2015-02-22 MED ORDER — MECLIZINE HCL 50 MG PO TABS: 50.0000 mg | ORAL_TABLET | Freq: Three times a day (TID) | ORAL | Status: DC | PRN

## 2015-02-22 MED ORDER — DEXTROSE 5 % IV SOLN
1.0000 g | Freq: Once | INTRAVENOUS | Status: AC
  Administered 2015-02-22: 1 g via INTRAVENOUS
  Filled 2015-02-22: qty 10

## 2015-02-22 NOTE — Discharge Instructions (Signed)
Please take the medications exactly as prescribed including the antibiotics 4 times a day. This is very important as you have a urinary tract infection. He should be seen immediately by your doctor if your symptoms worsen, if you cannot be seen return to the emergency department.  Meclizine for dizziness  Drink plenty of fluids  Please obtain all of your results from medical records or have your doctors office obtain the results - share them with your doctor - you should be seen at your doctors office in the next 2 days. Call today to arrange your follow up. Take the medications as prescribed. Please review all of the medicines and only take them if you do not have an allergy to them. Please be aware that if you are taking birth control pills, taking other prescriptions, ESPECIALLY ANTIBIOTICS may make the birth control ineffective - if this is the case, either do not engage in sexual activity or use alternative methods of birth control such as condoms until you have finished the medicine and your family doctor says it is OK to restart them. If you are on a blood thinner such as COUMADIN, be aware that any other medicine that you take may cause the coumadin to either work too much, or not enough - you should have your coumadin level rechecked in next 7 days if this is the case.  ?  It is also a possibility that you have an allergic reaction to any of the medicines that you have been prescribed - Everybody reacts differently to medications and while MOST people have no trouble with most medicines, you may have a reaction such as nausea, vomiting, rash, swelling, shortness of breath. If this is the case, please stop taking the medicine immediately and contact your physician.  ?  You should return to the ER if you develop severe or worsening symptoms.

## 2015-02-22 NOTE — ED Notes (Addendum)
Pt from home c/o nausea, vomiting, and dizziness x 2 days. 4 episodes of vomiting in last 24 hours.Pt denies headache currently. Pt difficult to obtain information from. Pt seems annoyed at this RN asking questions. This RN offered a wheelchair from lobby to acute room however, patient states " I'm walking now, just let me get where I'm going". Pt ambulates with steady gait.

## 2015-02-22 NOTE — ED Notes (Signed)
While doing vitals pt stated she felt light headed

## 2015-02-22 NOTE — ED Provider Notes (Signed)
CSN: [id number]     Arrival date & time 02/22/15  2016 History   First MD Initiated Contact with Patient 02/22/15 2018     Chief Complaint  Patient presents with  . Emesis  . 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  She reports this gets worse with standing, walking, turning her head. This gets better when she was perfectly still but she has a constant feeling of pressure in her epigastrium. She has vomited multiple times. She has not had any medication prior to arrival. She was recently seen for sciatic pain and given Decadron which improved her pain and she no longer has sciatica.  Patient is a 49 y.o. female presenting with vomiting and dizziness. The history is provided by the patient.  Emesis Dizziness Associated symptoms: vomiting     History reviewed. No pertinent past medical history. Past Surgical History  Procedure Laterality Date  . Breast surgery    . Cataract extraction    . Bunionectomy    . Cleft palate repair    . Cardiac surgery    . Ablation     No family history on file. Social History  Substance Use Topics  . Smoking status: Current Every Day Smoker    Types: Cigarettes  . Smokeless tobacco: None  . Alcohol Use: No   OB History    No data available     Review of Systems  Gastrointestinal: Positive for vomiting.  Neurological: Positive for dizziness.  All other systems reviewed and are negative.     Allergies  Tramadol and Zithromax  Home Medications   Prior to Admission medications   Medication Sig Start Date End Date Taking? Authorizing Provider  amitriptyline (ELAVIL) 75 MG tablet TK 1 T PO  QD 01/27/15  Yes Historical Provider, MD  diclofenac (VOLTAREN) 75 MG EC tablet Take  1 tablet (75 mg total) by mouth 2 (two) times daily. 02/13/15  Yes Waldemar Dickens, MD  HYDROcodone-acetaminophen ([place]) 7.5-325 MG per tablet TK 1 T PO Q 6 H 01/23/15  Yes Historical Provider, MD  methocarbamol (ROBAXIN) 500 MG tablet Take 1-2 tablets (500-1,000 mg total) by mouth every 6 (six) hours as needed for muscle spasms. 02/13/15  Yes Waldemar Dickens, MD  ondansetron (ZOFRAN-ODT) 8 MG disintegrating tablet DIS ONE T PO  Q 8 H PRN 01/23/15  Yes Historical Provider, MD  PARoxetine (PAXIL) 40 MG tablet TK 1 T PO QD 01/23/15  Yes Historical Provider, MD  cephALEXin (KEFLEX) 500 MG capsule Take 1 capsule (500 mg total) by mouth 4 (four) times daily. 02/22/15   Noemi Chapel, MD  meclizine (ANTIVERT) 50 MG tablet Take 1 tablet (50 mg total) by mouth 3 (three) times daily as needed. 02/22/15   Noemi Chapel, MD  methylPREDNISolone (MEDROL DOSEPAK) 4 MG TBPK tablet Take as prescribed Patient not taking: Reported on 02/22/2015 02/13/15   Waldemar Dickens, MD  sertraline (ZOLOFT) 100 MG tablet Take 1 tablet (100 mg total) by mouth daily. Patient not taking: Reported on 02/22/2015 10/22/14   Gregor Hams, MD   BP 137/75 mmHg  Pulse 84  Temp(Src) 97.5 F (36.4 C) (Oral)  Resp 20  SpO2 100% Physical Exam  Constitutional: She appears well-developed and well-nourished. No  distress.  HENT:  Head: Normocephalic and atraumatic.  Mouth/Throat: Oropharynx is clear and moist. No oropharyngeal exudate.  Eyes: EOM are normal. Right eye exhibits no discharge. Left eye exhibits no discharge. No scleral icterus.  Right eye cornea is hazy, left eye has normal-appearing pupil and visual acuity  Neck: Normal range of motion. Neck supple. No JVD present. No thyromegaly present.  Cardiovascular: Normal rate, regular rhythm, normal heart sounds and intact distal pulses.  Exam reveals no gallop and no friction rub.   No murmur heard. Pulmonary/Chest: Effort normal and breath sounds normal. No respiratory distress. She has no  wheezes. She has no rales.  Abdominal: Soft. Bowel sounds are normal. She exhibits no distension and no mass. There is no tenderness.  Musculoskeletal: Normal range of motion. She exhibits no edema or tenderness.  Lymphadenopathy:    She has no cervical adenopathy.  Neurological: She is alert. Coordination normal.  Skin: Skin is warm and dry. No rash noted. No erythema.  Psychiatric: She has a normal mood and affect. Her behavior is normal.  Nursing note and vitals reviewed.   ED Course  Procedures (including critical care time) Labs Review Labs Reviewed  COMPREHENSIVE METABOLIC PANEL - Abnormal; Notable for the following:    Glucose, Bld 104 (*)    AST 49 (*)    ALT 94 (*)    All other components within normal limits  URINALYSIS, ROUTINE W REFLEX MICROSCOPIC (NOT AT [facility]) - Abnormal; Notable for the following:    APPearance TURBID (*)    Hgb urine dipstick MODERATE (*)    Leukocytes, UA MODERATE (*)    All other components within normal limits  URINE MICROSCOPIC-ADD ON - Abnormal; Notable for the following:    Squamous Epithelial / LPF MANY (*)    Bacteria, UA MANY (*)    All other components within normal limits  CBG MONITORING, ED - Abnormal; Notable for the following:    Glucose-Capillary 107 (*)    All other components within normal limits  LIPASE, BLOOD  CBC  TROPONIN I    Imaging Review No results found. I have personally reviewed and evaluated these images and lab results as part of my medical decision-making.   EKG Interpretation   Date/Time:  Sunday February 22 2015 20:26:14 EDT Ventricular Rate:  88 PR Interval:  141 QRS Duration: 78 QT Interval:  388 QTC Calculation: 469 R Axis:   81 Text Interpretation:  Sinus rhythm ST elev, probable normal early repol  pattern no reciprocal changes ECG OTHERWISE WITHIN NORMAL LIMITS Since  last tracing rate faster Confirmed by MILLER  MD, BRIAN ([id number]) on  02/22/2015 8:35:29 PM      MDM   Final diagnoses:   UTI (lower urinary tract infection)  Vertigo    The patient has a normal neurologic exam, she has no inducible nystagmus but is very uncomfortable with any position changes. She is more comfortable sitting up with her head turned to the left. She has no cardiac findings on exam but does have an abnormal EKG with some nonspecific ST segment changes. We'll obtain a troponin, give her Zofran, meclizine.  Labs show UTI - meds improved sx, given Rocephin - VS normal, can go home - pt agreeable.  Meds given in ED:  Medications  sodium chloride 0.9 % bolus 1,000 mL (0 mLs Intravenous Stopped 02/22/15 2236)  ondansetron (ZOFRAN) injection 4 mg (4 mg Intravenous Given 02/22/15 2106)  meclizine (ANTIVERT) tablet 25 mg (25 mg Oral Given 02/22/15 2106)  cefTRIAXone (ROCEPHIN) 1 g in dextrose 5 % 50 mL IVPB (0 g Intravenous Stopped 02/22/15 2236)    New Prescriptions   CEPHALEXIN (KEFLEX) 500 MG CAPSULE    Take 1 capsule (500 mg total) by mouth 4 (four) times daily.   MECLIZINE (ANTIVERT) 50 MG TABLET    Take 1 tablet (50 mg total) by mouth 3 (three) times daily as needed.      Noemi Chapel, MD 02/22/15 [phone]

## 2015-02-22 NOTE — ED Notes (Addendum)
Pt is aware of urine sample 

## 2015-06-08 ENCOUNTER — Encounter (HOSPITAL_COMMUNITY): Payer: Self-pay

## 2015-06-08 ENCOUNTER — Emergency Department (HOSPITAL_COMMUNITY)
Admission: EM | Admit: 2015-06-08 | Discharge: 2015-06-08 | Disposition: A | Discharge status: Home / Self Care | Payer: Self-pay | Attending: Emergency Medicine | Admitting: Emergency Medicine

## 2015-06-08 DIAGNOSIS — Z791 Long term (current) use of non-steroidal anti-inflammatories (NSAID): Secondary | ICD-10-CM | POA: Insufficient documentation

## 2015-06-08 DIAGNOSIS — M5442 Lumbago with sciatica, left side: Secondary | ICD-10-CM | POA: Insufficient documentation

## 2015-06-08 DIAGNOSIS — Z79899 Other long term (current) drug therapy: Secondary | ICD-10-CM | POA: Insufficient documentation

## 2015-06-08 DIAGNOSIS — Z792 Long term (current) use of antibiotics: Secondary | ICD-10-CM | POA: Insufficient documentation

## 2015-06-08 DIAGNOSIS — F17210 Nicotine dependence, cigarettes, uncomplicated: Secondary | ICD-10-CM | POA: Insufficient documentation

## 2015-06-08 DIAGNOSIS — M79605 Pain in left leg: Secondary | ICD-10-CM | POA: Insufficient documentation

## 2015-06-08 MED ORDER — METHYLPREDNISOLONE 4 MG PO TBPK
ORAL_TABLET | ORAL | Status: DC
Start: 2015-06-08 — End: 2016-05-17

## 2015-06-08 MED ORDER — METHOCARBAMOL 500 MG PO TABS: 500.0000 mg | ORAL_TABLET | Freq: Two times a day (BID) | ORAL | Status: DC

## 2015-06-08 MED ORDER — HYDROCODONE-ACETAMINOPHEN 5-325 MG PO TABS
1.0000 | ORAL_TABLET | Freq: Once | ORAL | Status: AC
  Administered 2015-06-08: 1 via ORAL
  Filled 2015-06-08: qty 1

## 2015-06-08 MED ORDER — KETOROLAC TROMETHAMINE 30 MG/ML IJ SOLN
30.0000 mg | Freq: Once | INTRAMUSCULAR | Status: AC
  Administered 2015-06-08: 30 mg via INTRAMUSCULAR
  Filled 2015-06-08: qty 1

## 2015-06-08 NOTE — ED Provider Notes (Signed)
CSN: [id number]     Arrival date & time 06/08/15  1048 History   First MD Initiated Contact with Patient 06/08/15 1142     Chief Complaint  Patient presents with  . Back Pain     (Consider location/radiation/quality/duration/timing/severity/associated sxs/prior Treatment) Patient is a 50 y.o. female presenting with back pain. The history is provided by the patient and medical records. No language interpreter was used.  Back Pain Associated symptoms: no chest pain and no fever    Back Pain: Patient presents for presents evaluation of low back problems.  Symptoms have been present for 4 weeks and are described as c/w prior sciatica/lbp flares - left sided throbbing, stabbing pain with shoots of sharp pain down left LE to calf.  Initial inciting event: none.Treatments so far initiated by patient: Tylenol with no relief. Previous workup: Followed by PCP. Previous treatments: Patient states improvement with Toradol shot and prednisone taper in past. Last steroids were in September 2016. Denies saddle anesthesia, fever, b/b incontinence. No sxs out of the ordinary from previous.   History reviewed. No pertinent past medical history. Past Surgical History  Procedure Laterality Date  . Breast surgery    . Cataract extraction    . Bunionectomy    . Cleft palate repair    . Cardiac surgery    . Ablation     No family history on file. Social History  Substance Use Topics  . Smoking status: Current Every Day Smoker    Types: Cigarettes  . Smokeless tobacco: None  . Alcohol Use: No   OB History    No data available     Review of Systems  Constitutional: Negative for fever and chills.  HENT: Negative for congestion.   Respiratory: Negative for shortness of breath.   Cardiovascular: Negative for chest pain.  Musculoskeletal: Positive for back pain and arthralgias. Negative for joint swelling, neck pain and neck stiffness.  Skin: Negative for rash.      Allergies  Tramadol and  Zithromax  Home Medications   Prior to Admission medications   Medication Sig Start Date End Date Taking? Authorizing Provider  amitriptyline (ELAVIL) 75 MG tablet TK 1 T PO  QD 01/27/15   Historical Provider, MD  cephALEXin (KEFLEX) 500 MG capsule Take 1 capsule (500 mg total) by mouth 4 (four) times daily. 02/22/15   Noemi Chapel, MD  diclofenac (VOLTAREN) 75 MG EC tablet Take 1 tablet (75 mg total) by mouth 2 (two) times daily. 02/13/15   Waldemar Dickens, MD  HYDROcodone-acetaminophen ([place]) 7.5-325 MG per tablet TK 1 T PO Q 6 H 01/23/15   Historical Provider, MD  meclizine (ANTIVERT) 50 MG tablet Take 1 tablet (50 mg total) by mouth 3 (three) times daily as needed. 02/22/15   Noemi Chapel, MD  methocarbamol (ROBAXIN) 500 MG tablet Take 1 tablet (500 mg total) by mouth 2 (two) times daily. 06/08/15   Jaime Pilcher Ward, PA-C  methylPREDNISolone (MEDROL DOSEPAK) 4 MG TBPK tablet Take as prescribed. 06/08/15   Ozella Almond Ward, PA-C  ondansetron (ZOFRAN-ODT) 8 MG disintegrating tablet DIS ONE T PO  Q 8 H PRN 01/23/15   Historical Provider, MD  PARoxetine (PAXIL) 40 MG tablet TK 1 T PO QD 01/23/15   Historical Provider, MD  sertraline (ZOLOFT) 100 MG tablet Take 1 tablet (100 mg total) by mouth daily. Patient not taking: Reported on 02/22/2015 10/22/14   Gregor Hams, MD   BP 147/81 mmHg  Pulse 116  Temp(Src) 98.3 F (  36.8 C) (Oral)  Resp 18  SpO2 99% Physical Exam  Constitutional: She is oriented to person, place, and time. She appears well-developed and well-nourished. No distress.  Alert and in no acute distress  HENT:  Head: Normocephalic and atraumatic.  Neck:  Full ROM without pain No midline tenderness No tenderness of paraspinal musculature  Cardiovascular: Normal rate, regular rhythm and normal heart sounds.  Exam reveals no gallop and no friction rub.   No murmur heard. Pulmonary/Chest: Effort normal and breath sounds normal. No respiratory distress. She has no wheezes. She has no  rales.  Abdominal: Soft. Bowel sounds are normal. She exhibits no mass. There is no rebound and no guarding.  Abdomen soft, non-tender, non-distended Bowel sounds positive in all four quadrants  Musculoskeletal: She exhibits no edema.       Back:  Patient is able to ambulate without difficulty.  No noted deformities or signs of inflammation. Curvature of cervical, thoracic, and lumbar spine within normal limits. No midline tenderness; tenderness to palpation as depicted in image. Decreased ROM secondary to pain. Increased pain with lumbar flexion and extension. Straight leg raises are positive left for radicular symptoms.  5/5 muscle strength of bilateral LE's   Neurological: She is alert and oriented to person, place, and time. She has normal reflexes.  Bilateral lower extremities neurovascularly intact.  Skin: Skin is warm and dry. No rash noted. No erythema.  Psychiatric: She has a normal mood and affect. Her behavior is normal. Judgment and thought content normal.  Nursing note and vitals reviewed.   ED Course  Procedures (including critical care time) Labs Review Labs Reviewed - No data to display  Imaging Review No results found. I have personally reviewed and evaluated these images and lab results as part of my medical decision-making.   EKG Interpretation None      MDM   Final diagnoses:  Left-sided low back pain with left-sided sciatica   [facility] presents with low back pain c/w prior sciatica flares.   Therapeutics: Toradol 30mg  IM, norco 5 mg  A&P: Low back pain with sciatica   - Prednisone, robaxin  - Symptomatic care  - PCP follow up  [facility] Ward, PA-C 06/08/15 [facility] [place], MD 06/08/15 2318

## 2015-06-08 NOTE — ED Notes (Signed)
Pt presents with c/o back pain that she reports has been going on for approx 4 weeks. Pt reports that her sciatica is acting up, hx of same.

## 2015-06-08 NOTE — Discharge Instructions (Signed)
Back Pain:  Your back pain should be treated with medicines such as ibuprofen or aleve and this back pain should get better over the next 2 weeks.  However if you develop severe or worsening pain, low back pain with fever, numbness, weakness or inability to walk or urinate, you should return to the ER immediately.  Please follow up with your doctor this week for a recheck if still having symptoms.  Low back pain is discomfort in the lower back that may be due to injuries to muscles and ligaments around the spine. Occasionally, it may be caused by a a problem to a part of the spine called a disc. The pain may last several days or a week;  However, most patients get completely well in 4 weeks.  COLD THERAPY DIRECTIONS:  Ice or gel packs can be used to reduce both pain and swelling. Ice is the most helpful within the first 24 to 48 hours after an injury or flareup from overusing a muscle or joint.  Ice is effective, has very few side effects, and is safe for most people to use.   If you expose your skin to cold temperatures for too long or without the proper protection, you can damage your skin or nerves. Watch for signs of skin damage due to cold.   HOME CARE INSTRUCTIONS  Follow these tips to use ice and cold packs safely.  Place a dry or damp towel between the ice and skin. A damp towel will cool the skin more quickly, so you may need to shorten the time that the ice is used.  For a more rapid response, add gentle compression to the ice.  Ice for no more than 10 to 20 minutes at a time. The bonier the area you are icing, the less time it will take to get the benefits of ice.  Check your skin after 5 minutes to make sure there are no signs of a poor response to cold or skin damage.  Rest 20 minutes or more in between uses.  Once your skin is numb, you can end your treatment. You can test numbness by very lightly touching your skin. The touch should be so light that you do not see the skin dimple  from the pressure of your fingertip. When using ice, most people will feel these normal sensations in this order: cold, burning, aching, and numbness.  Do not use ice on someone who cannot communicate their responses to pain, such as small children or people with dementia.

## 2015-08-17 ENCOUNTER — Emergency Department (INDEPENDENT_AMBULATORY_CARE_PROVIDER_SITE_OTHER)
Admission: EM | Admit: 2015-08-17 | Discharge: 2015-08-17 | Disposition: A | Discharge status: Home / Self Care | Payer: Self-pay | Source: Home / Self Care | Attending: Emergency Medicine | Admitting: Emergency Medicine

## 2015-08-17 ENCOUNTER — Encounter (HOSPITAL_COMMUNITY): Payer: Self-pay | Admitting: *Deleted

## 2015-08-17 DIAGNOSIS — H66012 Acute suppurative otitis media with spontaneous rupture of ear drum, left ear: Secondary | ICD-10-CM

## 2015-08-17 MED ORDER — IBUPROFEN 800 MG PO TABS
800.0000 mg | ORAL_TABLET | Freq: Once | ORAL | Status: DC
Start: 2015-08-17 — End: 2015-08-17

## 2015-08-17 MED ORDER — AMOXICILLIN-POT CLAVULANATE 875-125 MG PO TABS: 1.0000 | ORAL_TABLET | Freq: Two times a day (BID) | ORAL | Status: DC

## 2015-08-17 MED ORDER — OXYCODONE-ACETAMINOPHEN 5-325 MG PO TABS: 1.0000 | ORAL_TABLET | ORAL | Status: DC | PRN

## 2015-08-17 MED ORDER — IBUPROFEN 800 MG PO TABS
ORAL_TABLET | ORAL | Status: AC
  Filled 2015-08-17: qty 1

## 2015-08-17 NOTE — ED Notes (Signed)
Pt  Reports  Symptoms  Of  l  Earache    Which  Started  Today          she  Reports  The  Symptoms  Not  releived  Bi  ibuprophen       She  Reports a  History    Of  Ear infections  In the  Past

## 2015-08-17 NOTE — Discharge Instructions (Signed)
Otitis Media, Adult Otitis media is redness, soreness, and puffiness (swelling) in the space just behind your eardrum (middle ear). It may be caused by allergies or infection. It often happens along with a cold. HOME CARE  Take your medicine as told. Finish it even if you start to feel better.  Only take over-the-counter or prescription medicines for pain, discomfort, or fever as told by your doctor.  Follow up with your doctor as told. GET HELP IF:  You have otitis media only in one ear, or bleeding from your nose, or both.  You notice a lump on your neck.  You are not getting better in 3-5 days.  You feel worse instead of better. GET HELP RIGHT AWAY IF:   You have pain that is not helped with medicine.  You have puffiness, redness, or pain around your ear.  You get a stiff neck.  You cannot move part of your face (paralysis).  You notice that the bone behind your ear hurts when you touch it. MAKE SURE YOU:   Understand these instructions.  Will watch your condition.  Will get help right away if you are not doing well or get worse.   This information is not intended to replace advice given to you by your health care provider. Make sure you discuss any questions you have with your health care provider.   Document Released: 11/09/2007 Document Revised: 06/13/2014 Document Reviewed: 12/18/2012 Elsevier Interactive Patient Education 2016 [employer] Eardrum Perforation The eardrum is a thin, round tissue inside the ear. It allows you to hear. The eardrum can get torn (perforated). Eardrums often heal on their own. There is often little or no long-term hearing loss. HOME CARE  Keep your ear dry while it heals. Do not let your head go under water. Do not swim or dive until your doctor says it is okay.  Before you take a bath or shower, do one of these things to keep water out of your ear:  Put a waterproof earplug in your ear.  Put petroleum jelly all over a cotton  ball. Put the cotton ball in your ear.  Take medicines only as told by your doctor.  Avoid blowing your nose if you can. If you blow your nose, do it gently.  Continue your normal activities after your eardrum heals. Your doctor will tell you when your eardrum has healed.  Talk to your doctor before you fly on an airplane.  Keep all doctor follow-up visits as told by your doctor. This is important. GET HELP IF:  You have a fever. GET HELP RIGHT AWAY IF:  You have blood or yellowish-white fluid (pus) coming from your ear.  You feel dizzy or off balance.  You feel sick to your stomach (nauseous), or you throw up (vomit).  You have more pain.   This information is not intended to replace advice given to you by your health care provider. Make sure you discuss any questions you have with your health care provider.   Document Released: 11/10/2009 Document Revised: 06/13/2014 Document Reviewed: 12/30/2013 Elsevier Interactive Patient Education [employer].

## 2015-08-17 NOTE — ED Provider Notes (Signed)
CSN: [id number]     Arrival date & time 08/17/15  63 History   First MD Initiated Contact with Patient 08/17/15 1742     Chief Complaint  Patient presents with  . Otalgia   (Consider location/radiation/quality/duration/timing/severity/associated sxs/prior Treatment) HPI History obtained from patient:   LOCATION:left ear SEVERITY:8 DURATION:1000 CONTEXT:sudden onset this morning.  QUALITY:like similar ear aches MODIFYING FACTORS:none ASSOCIATED SYMPTOMS:while sitting in exam states she thinks her ear drum popped.  TIMING:constant OCCUPATION:  History reviewed. No pertinent past medical history. Past Surgical History  Procedure Laterality Date  . Breast surgery    . Cataract extraction    . Bunionectomy    . Cleft palate repair    . Cardiac surgery    . Ablation     History reviewed. No pertinent family history. Social History  Substance Use Topics  . Smoking status: Current Every Day Smoker    Types: Cigarettes  . Smokeless tobacco: None  . Alcohol Use: No   OB History    No data available     Review of Systems Left ear pain Allergies  Tramadol and Zithromax  Home Medications   Prior to Admission medications   Medication Sig Start Date End Date Taking? Authorizing Provider  amitriptyline (ELAVIL) 75 MG tablet TK 1 T PO  QD 01/27/15   Historical Provider, MD  amoxicillin-clavulanate (AUGMENTIN) 875-125 MG tablet Take 1 tablet by mouth every 12 (twelve) hours. 08/17/15   Konrad Felix, PA  cephALEXin (KEFLEX) 500 MG capsule Take 1 capsule (500 mg total) by mouth 4 (four) times daily. 02/22/15   Noemi Chapel, MD  diclofenac (VOLTAREN) 75 MG EC tablet Take 1 tablet (75 mg total) by mouth 2 (two) times daily. 02/13/15   Waldemar Dickens, MD  HYDROcodone-acetaminophen (NORCO) 7.5-325 MG per tablet TK 1 T PO Q 6 H 01/23/15   Historical Provider, MD  meclizine (ANTIVERT) 50 MG tablet Take 1 tablet (50 mg total) by mouth 3 (three) times daily as needed. 02/22/15   Noemi Chapel, MD  methocarbamol (ROBAXIN) 500 MG tablet Take 1 tablet (500 mg total) by mouth 2 (two) times daily. 06/08/15   Jaime Pilcher Ward, PA-C  methylPREDNISolone (MEDROL DOSEPAK) 4 MG TBPK tablet Take as prescribed. 06/08/15   Ozella Almond Ward, PA-C  ondansetron (ZOFRAN-ODT) 8 MG disintegrating tablet DIS ONE T PO  Q 8 H PRN 01/23/15   Historical Provider, MD  oxyCODONE-acetaminophen (PERCOCET/ROXICET) 5-325 MG tablet Take 1 tablet by mouth every 4 (four) hours as needed for severe pain. 08/17/15   Konrad Felix, PA  PARoxetine (PAXIL) 40 MG tablet TK 1 T PO QD 01/23/15   Historical Provider, MD  sertraline (ZOLOFT) 100 MG tablet Take 1 tablet (100 mg total) by mouth daily. Patient not taking: Reported on 02/22/2015 10/22/14   Gregor Hams, MD   Meds Ordered and Administered this Visit   Medications  ibuprofen (ADVIL,MOTRIN) tablet 800 mg (not administered)    BP 130/80 mmHg  Pulse 78  Temp(Src) 98.6 F (37 C) (Oral)  Resp 18  SpO2 100% No data found.   Physical Exam NURSES NOTES AND VITAL SIGNS REVIEWED. CONSTITUTIONAL: Well developed, well nourished,  acute distress HEENT: normocephalic, atraumatic,Left TM is perforated, red, bulging with poor light reflex and no motion, No bloody drainage in canal.  right TM is  normal EYES: Conjunctiva normal NECK:normal ROM, supple, no adenopathy PULMONARY:No respiratory distress, normal effort, Lungs: CTAb/l, no wheezes, or increased work of breathing CARDIOVASCULAR: RRR, no murmur  ABDOMEN: soft, ND, NT, +'ve BS MUSCULOSKELETAL: Normal ROM of all extremities,  SKIN: warm and dry without rash PSYCHIATRIC: Mood and affect, behavior are normal  ED Course  Procedures (including critical care time)  Labs Review Labs Reviewed - No data to display  Imaging Review No results found.   Visual Acuity Review  Right Eye Distance:   Left Eye Distance:   Bilateral Distance:    Right Eye Near:   Left Eye Near:    Bilateral Near:         RX augmentin, percocet MDM   1. Acute suppurative otitis media of left ear with spontaneous rupture of tympanic membrane, recurrence not specified     Patient is reassured that there are no issues that require transfer to higher level of care at this time or additional tests. Patient is advised to continue home symptomatic treatment. Patient is advised that if there are new or worsening symptoms to attend the emergency department, contact primary care provider, or return to UC. Instructions of care provided discharged home in stable condition. Return to work/school note provided.   THIS NOTE WAS GENERATED USING A VOICE RECOGNITION SOFTWARE PROGRAM. ALL REASONABLE EFFORTS  WERE MADE TO PROOFREAD THIS DOCUMENT FOR ACCURACY.  I have verbally reviewed the discharge instructions with the patient. A printed AVS was given to the patient.  All questions were answered prior to discharge.      Konrad Felix, [place] 08/17/15 1827

## 2016-02-27 ENCOUNTER — Other Ambulatory Visit: Payer: Self-pay | Admitting: Specialist

## 2016-02-27 DIAGNOSIS — M545 Low back pain: Secondary | ICD-10-CM

## 2016-03-04 ENCOUNTER — Other Ambulatory Visit: Payer: Self-pay | Admitting: Physician Assistant

## 2016-03-04 DIAGNOSIS — N63 Unspecified lump in unspecified breast: Secondary | ICD-10-CM

## 2016-03-06 ENCOUNTER — Ambulatory Visit
Admission: RE | Admit: 2016-03-06 | Discharge: 2016-03-06 | Disposition: A | Discharge status: Home / Self Care | Payer: 59 | Source: Ambulatory Visit | Attending: Specialist | Admitting: Specialist

## 2016-03-06 DIAGNOSIS — M545 Low back pain: Secondary | ICD-10-CM

## 2016-03-14 ENCOUNTER — Ambulatory Visit (INDEPENDENT_AMBULATORY_CARE_PROVIDER_SITE_OTHER): Payer: PRIVATE HEALTH INSURANCE | Admitting: Specialist

## 2016-03-14 DIAGNOSIS — M545 Low back pain: Secondary | ICD-10-CM

## 2016-03-29 ENCOUNTER — Ambulatory Visit (INDEPENDENT_AMBULATORY_CARE_PROVIDER_SITE_OTHER): Payer: PRIVATE HEALTH INSURANCE | Admitting: Physical Medicine and Rehabilitation

## 2016-03-29 ENCOUNTER — Encounter (INDEPENDENT_AMBULATORY_CARE_PROVIDER_SITE_OTHER): Payer: Self-pay | Admitting: Physical Medicine and Rehabilitation

## 2016-03-29 VITALS — BP 118/80 | HR 100 | Temp 98.0°F

## 2016-03-29 DIAGNOSIS — M5416 Radiculopathy, lumbar region: Secondary | ICD-10-CM | POA: Diagnosis not present

## 2016-03-29 MED ORDER — LIDOCAINE HCL (PF) 1 % IJ SOLN
0.3300 mL | Freq: Once | INTRAMUSCULAR | Status: AC
  Administered 2016-03-29: 0.3 mL

## 2016-03-29 MED ORDER — BUPIVACAINE HCL 0.25 % IJ SOLN
2.0000 mL | Freq: Once | INTRAMUSCULAR | Status: AC
  Administered 2016-03-29: 2 mL

## 2016-03-29 MED ORDER — METHYLPREDNISOLONE ACETATE 80 MG/ML IJ SUSP
80.0000 mg | Freq: Once | INTRAMUSCULAR | Status: AC
  Administered 2016-03-29: 80 mg

## 2016-03-29 NOTE — Procedures (Signed)
Lumbosacral Transforaminal Epidural Steroid Injection - Infraneural Approach with Fluoroscopic Guidance  Patient: Brandy [name]      Date of Birth: 20-Sep-1965 MRN: [id number] PCP: [place]      Visit Date: 03/29/2016   Universal Protocol:    Date/Time: 10/24/172:00 PM  Consent Given By: the patient  Position: PRONE   Additional Comments: Vital signs were monitored before and after the procedure. Patient was prepped and draped in the usual sterile fashion. The correct patient, procedure, and site was verified.   Injection Procedure Details:  Procedure Site One Meds Administered:  Meds ordered this encounter  Medications  . lidocaine (PF) (XYLOCAINE) 1 % injection 0.3 mL  . bupivacaine (MARCAINE) 0.25 % (with pres) injection 2 mL  . methylPREDNISolone acetate (DEPO-MEDROL) injection 80 mg     Laterality: Left  Location/Site:  Lumbar Spine - L4-5  Needle size: 22 G  Needle type: Spinal  Needle Placement: Transforaminal  Findings:  -Contrast Used: 2 mL iohexol 180 mg iodine/mL   -Comments: Excellent flow of contrast along the nerve and into the epidural space.  Procedure Details: After squaring off the end-plates of the desired vertebral level to get a true AP view, the C-arm was obliqued to the painful side so that the superior articulating process is positioned about 1/3 the length of the inferior endplate.  The needle was aimed toward the junction of the superior articular process and the transverse process of the inferior vertebrae. The needle's initial entry is in the lower third of the foramen through Kambin's triangle. The soft tissues overlying this target were infiltrated with 2-3 ml. of 1% Lidocaine without Epinephrine.  The spinal needle was then inserted and advanced toward the target using a "trajectory" view along the fluoroscope beam.  Under AP and lateral visualization, the needle was advanced so it did not puncture dura and did not  traverse medially beyond the 6 o'clock position of the pedicle. Bi-planar projections were used to confirm position. Aspiration was confirmed to be negative for CSF and/or blood. A 1-2 ml. volume of Isovue-250 was injected and flow of contrast was noted at each level. Radiographs were obtained for documentation purposes.   After attaining the desired flow of contrast documented above, a 0.5 to 1.0 ml test dose of 0.25% Marcaine was injected into each respective transforaminal space.  The patient was observed for 90 seconds post injection.  After no sensory deficits were reported, and normal lower extremity motor function was noted,   the above injectate was administered so that equal amounts of the injectate were placed at each foramen (level) into the transforaminal epidural space.   Additional Comments:  The patient tolerated the procedure well Dressing: Band-Aid    Post-procedure details: Patient was observed during the procedure. Post-procedure instructions were reviewed.  Patient left the clinic in stable condition.

## 2016-03-29 NOTE — Progress Notes (Signed)
Office Visit Note  Patient: Brandy [name]           Date of Birth: [birth year]           MRN: [id number] Visit Date: 03/29/2016              Requested by: Georga Hacking [facility] [address] [place], [place] [address] PCP: [place]   Assessment & Plan: Visit Diagnoses:  1. Lumbar radiculopathy     Follow-Up Instructions: f/u Nitka as scheduled  Orders:  Orders Placed This Encounter  Procedures  . Epidural Steroid injection    Meds ordered this encounter  Medications  . lidocaine (PF) (XYLOCAINE) 1 % injection 0.3 mL  . bupivacaine (MARCAINE) 0.25 % (with pres) injection 2 mL  . methylPREDNISolone acetate (DEPO-MEDROL) injection 80 mg      Procedures: Lumbosacral Transforaminal Epidural Steroid Injection - Infraneural Approach with Fluoroscopic Guidance  Patient: Brandy [name]      Date of Birth: 1965/07/02 MRN: [id number] PCP: [place]      Visit Date: 03/29/2016   Universal Protocol:    Date/Time: 10/24/172:00 PM  Consent Given By: the patient  Position: PRONE   Additional Comments: Vital signs were monitored before and after the procedure. Patient was prepped and draped in the usual sterile fashion. The correct patient, procedure, and site was verified.   Injection Procedure Details:  Procedure Site One Meds Administered:  Meds ordered this encounter  Medications  . lidocaine (PF) (XYLOCAINE) 1 % injection 0.3 mL  . bupivacaine (MARCAINE) 0.25 % (with pres) injection 2 mL  . methylPREDNISolone acetate (DEPO-MEDROL) injection 80 mg     Laterality: Left  Location/Site:  Lumbar Spine - L4-5  Needle size: 22 G  Needle type: Spinal  Needle Placement: Transforaminal  Findings:  -Contrast Used: 2 mL iohexol 180 mg iodine/mL   -Comments: Excellent flow of contrast along the nerve and into the epidural space.  Procedure Details: After squaring off the end-plates of the desired  vertebral level to get a true AP view, the C-arm was obliqued to the painful side so that the superior articulating process is positioned about 1/3 the length of the inferior endplate.  The needle was aimed toward the junction of the superior articular process and the transverse process of the inferior vertebrae. The needle's initial entry is in the lower third of the foramen through Kambin's triangle. The soft tissues overlying this target were infiltrated with 2-3 ml. of 1% Lidocaine without Epinephrine.  The spinal needle was then inserted and advanced toward the target using a "trajectory" view along the fluoroscope beam.  Under AP and lateral visualization, the needle was advanced so it did not puncture dura and did not traverse medially beyond the 6 o'clock position of the pedicle. Bi-planar projections were used to confirm position. Aspiration was confirmed to be negative for CSF and/or blood. A 1-2 ml. volume of Isovue-250 was injected and flow of contrast was noted at each level. Radiographs were obtained for documentation purposes.   After attaining the desired flow of contrast documented above, a 0.5 to 1.0 ml test dose of 0.25% Marcaine was injected into each respective transforaminal space.  The patient was observed for 90 seconds post injection.  After no sensory deficits were reported, and normal lower extremity motor function was noted,   the above injectate was administered so that equal amounts of the injectate were placed at each foramen (level) into the transforaminal  epidural space.   Additional Comments:  The patient tolerated the procedure well Dressing: Band-Aid    Post-procedure details: Patient was observed during the procedure. Post-procedure instructions were reviewed.  Patient left the clinic in stable condition.   Clinical Data: No additional findings.   Subjective: Chief Complaint  Patient presents with  . Lower Back - Pain    Pain for several years but  worse recently. Both sides but left side worse. Down both legs to calf. Occ tingling. Denies numbness or weakness. Pain worse with walking and standing.  No blood thinners or dye allergy and has driver with her today    Review of Systems   Objective: Vital Signs: BP 118/80 (BP Location: Right Arm, Patient Position: Sitting, Cuff Size: Normal)   Pulse 100   Temp 98 F (36.7 C) (Oral)   SpO2 98%    Physical Exam  Ortho Exam General appearance: NAD, conversant  Psych: Appropriate affect, alert and oriented to person, place and time  Eyes: anicteric sclerae, moist conjunctivae; no lid-lag; PERRLA Lungs: normal respiratory effort and no intercostal retractions, no wheezing CVA: normal pulses Extremities: No peripheral edema  Skin: Normal temperature, turgor and texture; no rash, ulcers or subcutaneous nodules MSK:/Neuro:   On manual muscle testing there is 5/5 strength in all muscle groups of the lower extremities bilaterally without deficits. There is no clonus bilaterally.    Specialty Comments:  No specialty comments available. Imaging: No results found.   PMFS History: There are no active problems to display for this patient.  History reviewed. No pertinent past medical history.  History reviewed. No pertinent family history. Past Surgical History:  Procedure Laterality Date  . ABLATION    . BREAST SURGERY    . BUNIONECTOMY    . CARDIAC SURGERY    . CATARACT EXTRACTION    . CLEFT PALATE REPAIR     Social History   Occupational History  . Not on file.   Social History Main Topics  . Smoking status: Current Every Day Smoker    Types: Cigarettes  . Smokeless tobacco: Never Used  . Alcohol use No  . Drug use: No  . Sexual activity: Not on file

## 2016-03-31 ENCOUNTER — Encounter: Payer: Self-pay | Admitting: Physician Assistant

## 2016-04-15 ENCOUNTER — Ambulatory Visit (INDEPENDENT_AMBULATORY_CARE_PROVIDER_SITE_OTHER): Payer: PRIVATE HEALTH INSURANCE | Admitting: Specialist

## 2016-04-21 ENCOUNTER — Ambulatory Visit (INDEPENDENT_AMBULATORY_CARE_PROVIDER_SITE_OTHER): Payer: PRIVATE HEALTH INSURANCE | Admitting: Specialist

## 2016-04-21 ENCOUNTER — Encounter (INDEPENDENT_AMBULATORY_CARE_PROVIDER_SITE_OTHER): Payer: Self-pay | Admitting: Specialist

## 2016-04-21 VITALS — BP 134/76 | HR 92 | Ht 67.0 in | Wt 188.0 lb

## 2016-04-21 DIAGNOSIS — M48062 Spinal stenosis, lumbar region with neurogenic claudication: Secondary | ICD-10-CM

## 2016-04-21 DIAGNOSIS — M5116 Intervertebral disc disorders with radiculopathy, lumbar region: Secondary | ICD-10-CM

## 2016-04-21 MED ORDER — TRAMADOL HCL 50 MG PO TABS: 50.0000 mg | ORAL_TABLET | Freq: Four times a day (QID) | ORAL | 1 refills | Status: DC | PRN

## 2016-04-21 MED ORDER — HYDROCODONE-ACETAMINOPHEN 7.5-325 MG PO TABS: 1.0000 | ORAL_TABLET | Freq: Four times a day (QID) | ORAL | 0 refills | Status: DC | PRN

## 2016-04-21 NOTE — Progress Notes (Signed)
Office Visit Note   Patient: Brandy [name]           Date of Birth: 19-Aug-1965           MRN: [id number] Visit Date: 04/21/2016              Requested by: Georga Hacking [facility] [address] [place], [place] [address] PCP: [place]   Assessment & Plan: Visit Diagnoses:  1. Spinal stenosis of lumbar region with neurogenic claudication   2. Lumbar disc herniation with radiculopathy     Plan: Avoid bending, stooping and avoid lifting weights greater than 10 lbs. Avoid prolong standing and walking. Surgery scheduling secretary Kandice Hams, will call you in the next week to schedule for surgery.  Surgery recommended is a lumbar laminectomy central and left  L5-S1  Take hydrocodone for for pain. Risk of surgery includes risk of infection 1 in 300 patients, bleeding 1 in 1000 chance you would need a transfusion.   Risk to the nerves is one in 10,000.  Expect improved walking and standing tolerance. Expect relief of leg pain but numbness may persist depending on the length and degree of pressure that has been present.    Follow-Up Instructions: No Follow-up on file.   Orders:  No orders of the defined types were placed in this encounter.  Meds ordered this encounter  Medications  . HYDROcodone-acetaminophen (NORCO) 7.5-325 MG tablet    Sig: Take 1 tablet by mouth every 6 (six) hours as needed for moderate pain. Take in the evening when not working or driving.    Dispense:  40 tablet    Refill:  0  . traMADol (ULTRAM) 50 MG tablet    Sig: Take 1 tablet (50 mg total) by mouth every 6 (six) hours as needed for moderate pain.    Dispense:  40 tablet    Refill:  1    Take tramadol during the day time.      Procedures: No procedures performed   Clinical Data: No additional findings.   Subjective: Chief Complaint  Patient presents with  . Lower Back - Follow-up, Pain    Patient returns today to follow up with transforaminal esi  injection done with Dr. Ernestina Patches on 03/29/2016. States the injection was no help at all. Still low back pain radiating into bilateral legs.Mostly lower back pain, having spasms in her back and left calf.Arches of the feet with drawing up of the toes, lasting 2 days. She is staying hydrated and having enough potassium. She is avoiding salt not good for her and there is a family history of HTN. Complains of the left shoulder being painful and neck stiffness and pain with turning her head to the left side. Has some pain with walking, and the left leg wanted to give out. C/O pain in her tailbone, feels like bone on bone. Bending increased her pain, occasionally the pain will radiate up to the T-L junction.     Review of Systems  HENT: Negative.   Eyes: Positive for visual disturbance.  Respiratory: Negative.   Cardiovascular: Negative.   Gastrointestinal: Negative.   Endocrine: Positive for cold intolerance and heat intolerance. Negative for polyuria.  Genitourinary: Negative for difficulty urinating, dyspareunia, dysuria, enuresis and flank pain.  Musculoskeletal: Positive for back pain and gait problem.  Skin: Negative.   Allergic/Immunologic: Negative for environmental allergies, food allergies and immunocompromised state.  Neurological: Positive for weakness, light-headedness and headaches. Negative for dizziness, tremors, seizures,  syncope, speech difficulty and numbness.  Hematological: Negative.   Psychiatric/Behavioral: Negative.      Objective: Vital Signs: BP 134/76   Pulse 92   Ht 5\' 7"  (1.702 m)   Wt 188 lb (85.3 kg)   BMI 29.44 kg/m   Physical Exam  Constitutional: She is oriented to person, place, and time. She appears well-developed and well-nourished.  HENT:  Head: Normocephalic and atraumatic.  Eyes: EOM are normal. Scleral icterus is present.  Neck: Normal range of motion. Neck supple. No JVD present.  Pulmonary/Chest: Effort normal and breath sounds normal.    Abdominal: Soft. Bowel sounds are normal.  Musculoskeletal: She exhibits tenderness. She exhibits no edema.  Neurological: She is oriented to person, place, and time.  Skin: Skin is warm and dry. No rash noted. No erythema. No pallor.  Psychiatric: She has a normal mood and affect. Her behavior is normal. Judgment and thought content normal.    Back Exam   Tenderness  The patient is experiencing tenderness in the lumbar.  Range of Motion  Extension: abnormal  Flexion: abnormal   Muscle Strength  Right Quadriceps:  5/5  Left Quadriceps:  5/5  Right Hamstrings:  5/5  Left Hamstrings:  5/5   Tests  Straight leg raise right: negative Straight leg raise left: positive at 80 deg  Reflexes  Patellar: Hyporeflexic Achilles: Hyporeflexic Babinski's sign: normal   Other  Toe Walk: abnormal Heel Walk: normal Sensation: normal Gait: normal       Specialty Comments:  No specialty comments available.  Imaging: No results found.   PMFS History: There are no active problems to display for this patient.  No past medical history on file.  No family history on file.  Past Surgical History:  Procedure Laterality Date  . ABLATION    . BREAST SURGERY    . BUNIONECTOMY    . CARDIAC SURGERY    . CATARACT EXTRACTION    . CLEFT PALATE REPAIR     Social History   Occupational History  . Not on file.   Social History Main Topics  . Smoking status: Current Every Day Smoker    Types: Cigarettes  . Smokeless tobacco: Never Used  . Alcohol use No  . Drug use: No  . Sexual activity: Not on file

## 2016-04-21 NOTE — Patient Instructions (Signed)
Avoid bending, stooping and avoid lifting weights greater than 10 lbs. Avoid prolong standing and walking. Surgery scheduling secretary Kandice Hams, will call you in the next week to schedule for surgery.  Surgery recommended is a lumbar laminectomy central and left  L5-S1  Take hydrocodone for for pain. Risk of surgery includes risk of infection 1 in 200 patients, bleeding 1/2% chance you would need a transfusion.   Risk to the nerves is one in 10,000.  Expect improved walking and standing tolerance. Expect relief of leg pain but numbness may persist depending on the length and degree of pressure that has been present.

## 2016-05-16 ENCOUNTER — Telehealth (INDEPENDENT_AMBULATORY_CARE_PROVIDER_SITE_OTHER): Payer: Self-pay | Admitting: Specialist

## 2016-05-16 ENCOUNTER — Telehealth (INDEPENDENT_AMBULATORY_CARE_PROVIDER_SITE_OTHER): Payer: Self-pay | Admitting: *Deleted

## 2016-05-16 ENCOUNTER — Other Ambulatory Visit (INDEPENDENT_AMBULATORY_CARE_PROVIDER_SITE_OTHER): Payer: Self-pay | Admitting: Specialist

## 2016-05-16 NOTE — Telephone Encounter (Signed)
Noted. Message sent to Dr. Louanne Skye waiting on reply. Thanks.

## 2016-05-16 NOTE — Telephone Encounter (Signed)
Pt called back bc she needs to come out of work today and would like a call back about this note after its finished

## 2016-05-16 NOTE — Telephone Encounter (Signed)
Patient says she needs to come out of work, she is requesting a note from dr.nitka [phone]

## 2016-05-16 NOTE — Telephone Encounter (Signed)
Letter printed and signed for the patient to pick up,or we can send.jen

## 2016-05-16 NOTE — Telephone Encounter (Signed)
Please advise on out of work note.  Thanks.

## 2016-05-17 NOTE — Telephone Encounter (Signed)
I called and advised patient that her letter was ready for pick up.

## 2016-05-18 ENCOUNTER — Telehealth (INDEPENDENT_AMBULATORY_CARE_PROVIDER_SITE_OTHER): Payer: Self-pay | Admitting: Specialist

## 2016-05-18 NOTE — Telephone Encounter (Signed)
See message below, please advise  Thanks. Nira Conn

## 2016-05-18 NOTE — Telephone Encounter (Signed)
Noted, Thanks

## 2016-05-18 NOTE — Telephone Encounter (Signed)
I called and spoke with Brandy [name], she is experiencing some hip discomfort and it is on the same left side as her pain in her thigh and calf, worse with standing and walking. The pain in the arm is mostly shoulder pain that is worse with overhead use of her arm. No numbness or paresthesias in the arms. I explained to her that we did not Note any groin pain at her recent appointment. If she is still having pain in the shoulder I can look at the shoulder on the morning of the surgery 05-23-2016 and see if maybe a cortisone injection performed while she was under anesthesia would be a consideration. She admitts that some of this may relate to stress. I'm inclined to think that stress may influence some of the pain but likely she has a bursitis and tendonitis of the shoulder. Recommend avoiding overhead use of the shoulder and overhead lifting. Recommended tylenol 2 tablets up to tid. Reassured her of the findings and how they relate to her condition.

## 2016-05-19 ENCOUNTER — Telehealth (INDEPENDENT_AMBULATORY_CARE_PROVIDER_SITE_OTHER): Payer: Self-pay

## 2016-05-19 NOTE — Telephone Encounter (Signed)
Brandy [name] from [facility] needs orders for SU. Pre-Admission  appt is tomorrow. Needs orders in Epic. Needs this done ASAP please. Thank you.

## 2016-05-19 NOTE — Pre-Procedure Instructions (Signed)
[name]  05/19/2016      [facility] [place], [place] - [address] AT [address] [place] [address] Phone: [phone] Fax: [phone]    Your procedure is scheduled on December 18  Report to Edie at [place].M.  Call this number if you have problems the morning of surgery:  [phone]   Remember:  Do not eat food or drink liquids after midnight.   Take these medicines the morning of surgery with A SIP OF WATER acetaminophen (TYLENOL if needed, HYDROcodone-acetaminophen (NORCO) if neede,d  methocarbamol (ROBAXIN), ondansetron (ZOFRAN-ODT), sertraline (ZOLOFT)  7 days prior to surgery STOP taking any Aspirin, Aleve, Naproxen, Ibuprofen, Motrin, Advil, Goody's, BC's, all herbal medications, fish oil, and all vitamins    Do not wear jewelry, make-up or nail polish.  Do not wear lotions, powders, or perfumes, or deoderant.  Do not shave 48 hours prior to surgery.    Do not bring valuables to the hospital.  [facility] is not responsible for any belongings or valuables.  Contacts, dentures or bridgework may not be worn into surgery.  Leave your suitcase in the car.  After surgery it may be brought to your room.  For patients admitted to the hospital, discharge time will be determined by your treatment team.  Patients discharged the day of surgery will not be allowed to drive home.    Special instructions:   [facility]- Preparing For Surgery  Before surgery, you can play an important role. Because skin is not sterile, your skin needs to be as free of germs as possible. You can reduce the number of germs on your skin by washing with CHG (chlorahexidine gluconate) Soap before surgery.  CHG is an antiseptic cleaner which kills germs and bonds with the skin to continue killing germs even after washing.  Please do not use if you have an allergy to CHG or antibacterial soaps.  If your skin becomes reddened/irritated stop using the CHG.  Do not shave (including legs and underarms) for at least 48 hours prior to first CHG shower. It is OK to shave your face.  Please follow these instructions carefully.   1. Shower the NIGHT BEFORE SURGERY and the MORNING OF SURGERY with CHG.   2. If you chose to wash your hair, wash your hair first as usual with your normal shampoo.  3. After you shampoo, rinse your hair and body thoroughly to remove the shampoo.  4. Use CHG as you would any other liquid soap. You can apply CHG directly to the skin and wash gently with a scrungie or a clean washcloth.   5. Apply the CHG Soap to your body ONLY FROM THE NECK DOWN.  Do not use on open wounds or open sores. Avoid contact with your eyes, ears, mouth and genitals (private parts). Wash genitals (private parts) with your normal soap.  6. Wash thoroughly, paying special attention to the area where your surgery will be performed.  7. Thoroughly rinse your body with warm water from the neck down.  8. DO NOT shower/wash with your normal soap after using and rinsing off the CHG Soap.  9. Pat yourself dry with a CLEAN TOWEL.   10. Wear CLEAN PAJAMAS   11. Place CLEAN SHEETS on your bed the night of your first shower and DO NOT SLEEP WITH PETS.    Day of Surgery: Do not apply  any deodorants/lotions. Please wear clean clothes to the hospital/surgery center.      Please read over the following fact sheets that you were given.

## 2016-05-19 NOTE — Telephone Encounter (Signed)
Spoke with Benjiman Core he completed orders.

## 2016-05-20 ENCOUNTER — Other Ambulatory Visit (INDEPENDENT_AMBULATORY_CARE_PROVIDER_SITE_OTHER): Payer: Self-pay | Admitting: Specialist

## 2016-05-20 ENCOUNTER — Other Ambulatory Visit: Payer: Self-pay

## 2016-05-20 ENCOUNTER — Encounter (HOSPITAL_COMMUNITY)
Admission: RE | Admit: 2016-05-20 | Discharge: 2016-05-20 | Disposition: A | Discharge status: Home / Self Care | Payer: 59 | Source: Ambulatory Visit | Attending: Specialist | Admitting: Specialist

## 2016-05-20 ENCOUNTER — Encounter (HOSPITAL_COMMUNITY): Payer: Self-pay | Admitting: *Deleted

## 2016-05-20 ENCOUNTER — Telehealth (INDEPENDENT_AMBULATORY_CARE_PROVIDER_SITE_OTHER): Payer: Self-pay

## 2016-05-20 DIAGNOSIS — K219 Gastro-esophageal reflux disease without esophagitis: Secondary | ICD-10-CM | POA: Diagnosis not present

## 2016-05-20 DIAGNOSIS — Z01812 Encounter for preprocedural laboratory examination: Secondary | ICD-10-CM

## 2016-05-20 DIAGNOSIS — M4807 Spinal stenosis, lumbosacral region: Secondary | ICD-10-CM | POA: Diagnosis not present

## 2016-05-20 DIAGNOSIS — M48062 Spinal stenosis, lumbar region with neurogenic claudication: Secondary | ICD-10-CM

## 2016-05-20 DIAGNOSIS — F17210 Nicotine dependence, cigarettes, uncomplicated: Secondary | ICD-10-CM

## 2016-05-20 DIAGNOSIS — G8918 Other acute postprocedural pain: Secondary | ICD-10-CM | POA: Diagnosis not present

## 2016-05-20 DIAGNOSIS — H5461 Unqualified visual loss, right eye, normal vision left eye: Secondary | ICD-10-CM | POA: Diagnosis not present

## 2016-05-20 DIAGNOSIS — Z9889 Other specified postprocedural states: Secondary | ICD-10-CM | POA: Diagnosis not present

## 2016-05-20 DIAGNOSIS — M1990 Unspecified osteoarthritis, unspecified site: Secondary | ICD-10-CM | POA: Diagnosis not present

## 2016-05-20 DIAGNOSIS — M7592 Shoulder lesion, unspecified, left shoulder: Secondary | ICD-10-CM | POA: Diagnosis present

## 2016-05-20 DIAGNOSIS — Q211 Atrial septal defect: Secondary | ICD-10-CM | POA: Diagnosis not present

## 2016-05-20 HISTORY — DX: Atrial septal defect: Q21.1

## 2016-05-20 HISTORY — DX: Cardiac murmur, unspecified: R01.1

## 2016-05-20 HISTORY — DX: Headache, unspecified: R51.9

## 2016-05-20 HISTORY — DX: Unspecified osteoarthritis, unspecified site: M19.90

## 2016-05-20 HISTORY — DX: Headache: R51

## 2016-05-20 HISTORY — DX: Gastro-esophageal reflux disease without esophagitis: K21.9

## 2016-05-20 HISTORY — DX: Legal blindness, as defined in USA: H54.8

## 2016-05-20 HISTORY — DX: Atrial septal defect, unspecified: Q21.10

## 2016-05-20 LAB — URINALYSIS, ROUTINE W REFLEX MICROSCOPIC
BILIRUBIN URINE: NEGATIVE
GLUCOSE, UA: NEGATIVE mg/dL
Ketones, ur: NEGATIVE mg/dL
NITRITE: NEGATIVE
PROTEIN: NEGATIVE mg/dL
Specific Gravity, Urine: 1.014 (ref 1.005–1.030)
pH: 5 (ref 5.0–8.0)

## 2016-05-20 LAB — CBC
HCT: 40.7 % (ref 36.0–46.0)
Hemoglobin: 13.3 g/dL (ref 12.0–15.0)
MCH: 30.5 pg (ref 26.0–34.0)
MCHC: 32.7 g/dL (ref 30.0–36.0)
MCV: 93.3 fL (ref 78.0–100.0)
Platelets: 348 10*3/uL (ref 150–400)
RBC: 4.36 MIL/uL (ref 3.87–5.11)
RDW: 13.9 % (ref 11.5–15.5)
WBC: 6.6 10*3/uL (ref 4.0–10.5)

## 2016-05-20 LAB — COMPREHENSIVE METABOLIC PANEL
ALK PHOS: 68 U/L (ref 38–126)
ALT: 56 U/L — AB (ref 14–54)
AST: 41 U/L (ref 15–41)
Albumin: 3.8 g/dL (ref 3.5–5.0)
Anion gap: 10 (ref 5–15)
BILIRUBIN TOTAL: 0.5 mg/dL (ref 0.3–1.2)
BUN: 10 mg/dL (ref 6–20)
CO2: 29 mmol/L (ref 22–32)
Calcium: 9.8 mg/dL (ref 8.9–10.3)
Chloride: 101 mmol/L (ref 101–111)
Creatinine, Ser: 0.91 mg/dL (ref 0.44–1.00)
Glucose, Bld: 98 mg/dL (ref 65–99)
Potassium: 3.5 mmol/L (ref 3.5–5.1)
Sodium: 140 mmol/L (ref 135–145)
Total Protein: 7.1 g/dL (ref 6.5–8.1)

## 2016-05-20 LAB — SURGICAL PCR SCREEN
MRSA, PCR: NEGATIVE
Staphylococcus aureus: NEGATIVE

## 2016-05-20 LAB — APTT: aPTT: 33 seconds (ref 24–36)

## 2016-05-20 LAB — PROTIME-INR
INR: 0.99
Prothrombin Time: 13.1 seconds (ref 11.4–15.2)

## 2016-05-20 NOTE — Progress Notes (Signed)
Message left with Sherrie at Dr. Otho Ket office regarding pt u/a.

## 2016-05-20 NOTE — Telephone Encounter (Signed)
Nurse from short stay left message asking you to review U/A to make sure okay for surgery Monday.

## 2016-05-20 NOTE — Progress Notes (Signed)
Lab reviewed, Hgb pos in urine with microhematuria, trace leukocytes, neg for WBC creatinine and BUN normal no anemia, so not really a contraindication to surgery. May need a renal u/s if follow up UA confirms persistent hematuria.

## 2016-05-20 NOTE — Progress Notes (Addendum)
PCP is [facility] States she saw a heart Dr - Dr Aline Brochure from [place] last saw in 2006 States she had a hole in her heart repaired. Denies any chest pain. Denies ever having a card cath or echo. States she has a stress test in 2006 that was normal. Request sent for records to [facility]. Ebony Hail, [place] called and informed of pt history.

## 2016-05-20 NOTE — Telephone Encounter (Signed)
UA reviewed and noted, will need follow up UA to check for persisting microhematuria, kidney function is normal, no anemia, not a contraindication for surgery but does need follow up. jen

## 2016-05-20 NOTE — Progress Notes (Signed)
Anesthesia Note: Patient is a 50 year old scheduled for left L5-S1 lateral recess decompression with central and right decompression via left side on 05/23/16 (first case) on Dr. Louanne Skye.  History includes smoking, arthritis, GERD, murmur (for ASD, now s/p closure with Amplatzer device repair 10/05/04 Dr. Myriam Jacobson at [facility]), headache, legally blind right eye, cleft lip and palate repair. 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 She saw patient recently and signed a note of medical clearance.   BP (!) 151/78   Pulse 88   Temp 36.7 C (Oral)   Resp 18   Ht 5\' 7"  (1.702 m)   Wt 188 lb 12.8 oz (85.6 kg)   SpO2 100%   BMI 29.57 kg/m  On exam, she is a pleasant black female in NAD. Wears glasses. Evidence of cleft lip repair. History of cleft palate repair (states still with small opening below right nares region). Heart RRR. No murmur noted. Lungs clear. No carotid bruits. Poor dentition. Has some broken teeth. Denied loose teeth. No ankle edema. She denied chest pain, SOB, edema, arrhythmia, CHF, MI. She is able to clean her house without CV symptoms. Activity is limited due to back pain. 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 Louanne Skye took her out due to severe low back pain.  05/20/16 EKG: NSR.  12/15/05 Echo [facility]): Interpretation: Normal left ventricular function with mild LVH. LVEF > 55%. Diastolic function class: Relaxation abnormality grade 1 corresponds to E/A reversal. Trivial MR. Trivial PR. Trivial TR. No valvular stenosis.  10/05/04 Cardiac cath [facility]): Left main is listed as normal on diagnostic summary, but < 25% on the report. LAD, LCX, and RCA systems documented as normal. No pulmonary hypertension. 18 -mm secundum ASD. Left dominance. Normal LA size. Mildly enlarged RV. Successful ASD occluder deployment. (By notes, follow-up echo showed enlarged RV, but proper positioning of the ASD  device.)  10/01/04 Cardiac MRI (PRE-ASD closure; [facility]): Summary: 1. The left ventricle is normal in size and wall thickness. Global and regional left ventricular systolic function is normal. Visual EF is 60%. 2. The right ventricle is moderately enlarged with normal systolic function. The right atrium is moderately enlarged. 3. Mild to moderate tricuspid regurgitation is present. 4. The anterior leaflet of the mitral valve appears mildly thickened without clinically significant mitral regurgitation. 5. A trivial pericardial effusion is present. 6. Ostium secundum ASD is identified and measures 2.4 x 1.5 cm. The flow across the ASD is calculated as 101 cm/s peak velocity and 6.7 L/m. 7. The Qp/Qs measures 1.9. Normal wall thickness. Normal cavity size. No AI. No AS. RV moderately enlarged cavity. Mild thickening of MV with trivial MR. No MS. Normal left atrium. Moderately enlarged right atrium. Trivial pericardial effusion. Fully mobile pulmonic valve. No PI or PS. Severe calcification of TV with mil TR. No TS.   Preoperative labs noted.  Patient with history of ASD repair. No issues since. She has medical clearance. Discussed with Dr. Therisa Doyne. If no acute changes then it is anticipated that she can proceed as planned.  George Hugh [facility] Short Stay Center/Anesthesiology Phone [phone] 05/20/2016 5:34 PM

## 2016-05-22 NOTE — Anesthesia Preprocedure Evaluation (Signed)
Anesthesia Evaluation  Patient identified by MRN, date of birth, ID band Patient awake    Reviewed: Allergy & Precautions, NPO status , Patient's Chart, lab work & pertinent test results  Airway Mallampati: II  TM Distance: >3 FB Neck ROM: Full    Dental no notable dental hx.    Pulmonary Current Smoker,    Pulmonary exam normal breath sounds clear to auscultation       Cardiovascular + CAD  Normal cardiovascular exam+ Valvular Problems/Murmurs  Rhythm:Regular Rate:Normal  Non obstructive left main CAD   Neuro/Psych  Headaches, negative psych ROS   GI/Hepatic Neg liver ROS, GERD  ,  Endo/Other  negative endocrine ROS  Renal/GU negative Renal ROS     Musculoskeletal  (+) Arthritis ,   Abdominal   Peds  Hematology negative hematology ROS (+)   Anesthesia Other Findings   Reproductive/Obstetrics negative OB ROS                             Anesthesia Physical Anesthesia Plan  ASA: II  Anesthesia Plan: General   Post-op Pain Management:    Induction: Intravenous  Airway Management Planned: Oral ETT  Additional Equipment:   Intra-op Plan:   Post-operative Plan: Extubation in OR  Informed Consent: I have reviewed the patients History and Physical, chart, labs and discussed the procedure including the risks, benefits and alternatives for the proposed anesthesia with the patient or authorized representative who has indicated his/her understanding and acceptance.   Dental advisory given  Plan Discussed with: CRNA  Anesthesia Plan Comments:         Anesthesia Quick Evaluation

## 2016-05-23 ENCOUNTER — Encounter (HOSPITAL_COMMUNITY): Payer: Self-pay | Admitting: *Deleted

## 2016-05-23 ENCOUNTER — Encounter (HOSPITAL_COMMUNITY)
Admission: AD | Disposition: A | Discharge status: Home / Self Care | Payer: Self-pay | Source: Ambulatory Visit | Attending: Specialist

## 2016-05-23 ENCOUNTER — Ambulatory Visit (HOSPITAL_COMMUNITY): Payer: 59

## 2016-05-23 ENCOUNTER — Ambulatory Visit (HOSPITAL_COMMUNITY): Payer: 59 | Admitting: Vascular Surgery

## 2016-05-23 ENCOUNTER — Ambulatory Visit (HOSPITAL_COMMUNITY): Payer: 59 | Admitting: Anesthesiology

## 2016-05-23 ENCOUNTER — Observation Stay (HOSPITAL_COMMUNITY)
Admission: AD | Admit: 2016-05-23 | Discharge: 2016-05-24 | Disposition: A | Discharge status: Home / Self Care | Payer: 59 | Source: Ambulatory Visit | Attending: Specialist | Admitting: Specialist

## 2016-05-23 DIAGNOSIS — Q211 Atrial septal defect: Secondary | ICD-10-CM | POA: Insufficient documentation

## 2016-05-23 DIAGNOSIS — H5461 Unqualified visual loss, right eye, normal vision left eye: Secondary | ICD-10-CM | POA: Insufficient documentation

## 2016-05-23 DIAGNOSIS — M7592 Shoulder lesion, unspecified, left shoulder: Secondary | ICD-10-CM | POA: Insufficient documentation

## 2016-05-23 DIAGNOSIS — G8918 Other acute postprocedural pain: Secondary | ICD-10-CM | POA: Insufficient documentation

## 2016-05-23 DIAGNOSIS — M1990 Unspecified osteoarthritis, unspecified site: Secondary | ICD-10-CM | POA: Insufficient documentation

## 2016-05-23 DIAGNOSIS — M778 Other enthesopathies, not elsewhere classified: Secondary | ICD-10-CM | POA: Diagnosis present

## 2016-05-23 DIAGNOSIS — M7582 Other shoulder lesions, left shoulder: Secondary | ICD-10-CM | POA: Diagnosis present

## 2016-05-23 DIAGNOSIS — M48062 Spinal stenosis, lumbar region with neurogenic claudication: Principal | ICD-10-CM

## 2016-05-23 DIAGNOSIS — M4807 Spinal stenosis, lumbosacral region: Secondary | ICD-10-CM | POA: Insufficient documentation

## 2016-05-23 DIAGNOSIS — K219 Gastro-esophageal reflux disease without esophagitis: Secondary | ICD-10-CM | POA: Insufficient documentation

## 2016-05-23 DIAGNOSIS — Z9889 Other specified postprocedural states: Secondary | ICD-10-CM | POA: Insufficient documentation

## 2016-05-23 DIAGNOSIS — Z419 Encounter for procedure for purposes other than remedying health state, unspecified: Secondary | ICD-10-CM

## 2016-05-23 DIAGNOSIS — M961 Postlaminectomy syndrome, not elsewhere classified: Secondary | ICD-10-CM | POA: Diagnosis present

## 2016-05-23 HISTORY — PX: SHOULDER INJECTION: SHX5048

## 2016-05-23 HISTORY — PX: LUMBAR LAMINECTOMY/DECOMPRESSION MICRODISCECTOMY: SHX5026

## 2016-05-23 SURGERY — LUMBAR LAMINECTOMY/DECOMPRESSION MICRODISCECTOMY
Anesthesia: General | Site: Spine Lumbar | Laterality: Left

## 2016-05-23 MED ORDER — MORPHINE SULFATE (PF) 2 MG/ML IV SOLN
1.0000 mg | INTRAVENOUS | Status: DC | PRN
  Administered 2016-05-24: 1 mg via INTRAVENOUS
  Filled 2016-05-23 (×2): qty 1

## 2016-05-23 MED ORDER — ONDANSETRON HCL 4 MG/2ML IJ SOLN
4.0000 mg | INTRAMUSCULAR | Status: DC | PRN
  Administered 2016-05-23: 4 mg via INTRAVENOUS
  Filled 2016-05-23: qty 2

## 2016-05-23 MED ORDER — METHOCARBAMOL 500 MG PO TABS
ORAL_TABLET | ORAL | Status: AC
  Filled 2016-05-23: qty 1

## 2016-05-23 MED ORDER — METHYLPREDNISOLONE ACETATE 40 MG/ML IJ SUSP
INTRAMUSCULAR | Status: AC
  Filled 2016-05-23: qty 1

## 2016-05-23 MED ORDER — CEFAZOLIN SODIUM-DEXTROSE 2-4 GM/100ML-% IV SOLN
2.0000 g | INTRAVENOUS | Status: AC
  Administered 2016-05-23: 2 g via INTRAVENOUS
  Filled 2016-05-23: qty 100

## 2016-05-23 MED ORDER — MIDAZOLAM HCL 2 MG/2ML IJ SOLN
INTRAMUSCULAR | Status: DC | PRN
  Administered 2016-05-23: 2 mg via INTRAVENOUS

## 2016-05-23 MED ORDER — METHYLPREDNISOLONE ACETATE 40 MG/ML IJ SUSP
INTRAMUSCULAR | Status: DC | PRN
  Administered 2016-05-23: 40 mg via INTRA_ARTICULAR

## 2016-05-23 MED ORDER — PHENYLEPHRINE 40 MCG/ML (10ML) SYRINGE FOR IV PUSH (FOR BLOOD PRESSURE SUPPORT)
PREFILLED_SYRINGE | INTRAVENOUS | Status: AC
  Filled 2016-05-23: qty 10

## 2016-05-23 MED ORDER — METHOCARBAMOL 500 MG PO TABS: 500.0000 mg | ORAL_TABLET | Freq: Three times a day (TID) | ORAL | 0 refills | Status: DC

## 2016-05-23 MED ORDER — SODIUM CHLORIDE 0.9% FLUSH: 3.0000 mL | INTRAVENOUS | Status: DC | PRN

## 2016-05-23 MED ORDER — SODIUM CHLORIDE 0.9 % IV SOLN
INTRAVENOUS | Status: DC | PRN
  Administered 2016-05-23: 40 ug/min via INTRAVENOUS

## 2016-05-23 MED ORDER — SUCCINYLCHOLINE CHLORIDE 200 MG/10ML IV SOSY
PREFILLED_SYRINGE | INTRAVENOUS | Status: AC
  Filled 2016-05-23: qty 10

## 2016-05-23 MED ORDER — PHENOL 1.4 % MT LIQD: 1.0000 | OROMUCOSAL | Status: DC | PRN

## 2016-05-23 MED ORDER — EPHEDRINE SULFATE 50 MG/ML IJ SOLN
INTRAMUSCULAR | Status: DC | PRN
  Administered 2016-05-23: 5 mg via INTRAVENOUS

## 2016-05-23 MED ORDER — PROMETHAZINE HCL 25 MG/ML IJ SOLN: 6.2500 mg | INTRAMUSCULAR | Status: DC | PRN

## 2016-05-23 MED ORDER — PHENYLEPHRINE HCL 10 MG/ML IJ SOLN
INTRAMUSCULAR | Status: DC | PRN
  Administered 2016-05-23 (×3): 40 ug via INTRAVENOUS
  Administered 2016-05-23 (×2): 80 ug via INTRAVENOUS

## 2016-05-23 MED ORDER — CEFAZOLIN IN D5W 1 GM/50ML IV SOLN
1.0000 g | Freq: Three times a day (TID) | INTRAVENOUS | Status: AC
  Administered 2016-05-23 (×2): 1 g via INTRAVENOUS
  Filled 2016-05-23 (×2): qty 50

## 2016-05-23 MED ORDER — HYDROMORPHONE HCL 1 MG/ML IJ SOLN
0.2500 mg | INTRAMUSCULAR | Status: DC | PRN
  Administered 2016-05-23 (×2): 0.5 mg via INTRAVENOUS

## 2016-05-23 MED ORDER — ONDANSETRON 8 MG PO TBDP
8.0000 mg | ORAL_TABLET | Freq: Three times a day (TID) | ORAL | Status: DC | PRN
  Filled 2016-05-23: qty 1

## 2016-05-23 MED ORDER — MECLIZINE HCL 25 MG PO TABS
50.0000 mg | ORAL_TABLET | Freq: Three times a day (TID) | ORAL | Status: DC | PRN
  Filled 2016-05-23: qty 2

## 2016-05-23 MED ORDER — BISACODYL 5 MG PO TBEC
5.0000 mg | DELAYED_RELEASE_TABLET | Freq: Every day | ORAL | Status: DC | PRN
  Administered 2016-05-24: 5 mg via ORAL
  Filled 2016-05-23: qty 1

## 2016-05-23 MED ORDER — SODIUM CHLORIDE 0.9 % IV SOLN: INTRAVENOUS | Status: DC

## 2016-05-23 MED ORDER — BUPIVACAINE LIPOSOME 1.3 % IJ SUSP
INTRAMUSCULAR | Status: DC | PRN
Start: 2016-05-23 — End: 2016-05-23
  Administered 2016-05-23: 20 mL

## 2016-05-23 MED ORDER — PANTOPRAZOLE SODIUM 40 MG PO TBEC
40.0000 mg | DELAYED_RELEASE_TABLET | Freq: Every day | ORAL | Status: DC
  Administered 2016-05-23: 40 mg via ORAL
  Filled 2016-05-23: qty 1

## 2016-05-23 MED ORDER — ROCURONIUM BROMIDE 100 MG/10ML IV SOLN
INTRAVENOUS | Status: DC | PRN
  Administered 2016-05-23: 10 mg via INTRAVENOUS
  Administered 2016-05-23 (×2): 50 mg via INTRAVENOUS
  Administered 2016-05-23: 10 mg via INTRAVENOUS

## 2016-05-23 MED ORDER — PANTOPRAZOLE SODIUM 40 MG IV SOLR: 40.0000 mg | Freq: Every day | INTRAVENOUS | Status: DC

## 2016-05-23 MED ORDER — BUPIVACAINE HCL 0.5 % IJ SOLN
INTRAMUSCULAR | Status: DC | PRN
  Administered 2016-05-23: 25 mL via INTRA_ARTICULAR

## 2016-05-23 MED ORDER — FENTANYL CITRATE (PF) 100 MCG/2ML IJ SOLN
INTRAMUSCULAR | Status: DC | PRN
  Administered 2016-05-23 (×4): 50 ug via INTRAVENOUS

## 2016-05-23 MED ORDER — KETOROLAC TROMETHAMINE 30 MG/ML IJ SOLN
30.0000 mg | Freq: Once | INTRAMUSCULAR | Status: AC
  Administered 2016-05-23: 30 mg via INTRAVENOUS

## 2016-05-23 MED ORDER — MEPERIDINE HCL 25 MG/ML IJ SOLN: 6.2500 mg | INTRAMUSCULAR | Status: DC | PRN

## 2016-05-23 MED ORDER — DOCUSATE SODIUM 100 MG PO CAPS
100.0000 mg | ORAL_CAPSULE | Freq: Two times a day (BID) | ORAL | Status: DC
  Administered 2016-05-24: 100 mg via ORAL
  Filled 2016-05-23 (×2): qty 1

## 2016-05-23 MED ORDER — THROMBIN 20000 UNITS EX SOLR
CUTANEOUS | Status: DC | PRN
  Administered 2016-05-23: 20 mL via TOPICAL

## 2016-05-23 MED ORDER — KETOROLAC TROMETHAMINE 30 MG/ML IJ SOLN
INTRAMUSCULAR | Status: AC
Start: 2016-05-23 — End: 2016-05-23
  Filled 2016-05-23: qty 1

## 2016-05-23 MED ORDER — 0.9 % SODIUM CHLORIDE (POUR BTL) OPTIME
TOPICAL | Status: DC | PRN
  Administered 2016-05-23: 1000 mL

## 2016-05-23 MED ORDER — FENTANYL CITRATE (PF) 100 MCG/2ML IJ SOLN
INTRAMUSCULAR | Status: AC
  Filled 2016-05-23: qty 4

## 2016-05-23 MED ORDER — THROMBIN 20000 UNITS EX SOLR
CUTANEOUS | Status: AC
  Filled 2016-05-23: qty 20000

## 2016-05-23 MED ORDER — HYDROMORPHONE HCL 1 MG/ML IJ SOLN
INTRAMUSCULAR | Status: AC
  Filled 2016-05-23: qty 0.5

## 2016-05-23 MED ORDER — ACETAMINOPHEN 325 MG PO TABS: 650.0000 mg | ORAL_TABLET | ORAL | Status: DC | PRN

## 2016-05-23 MED ORDER — ONDANSETRON HCL 4 MG/2ML IJ SOLN
INTRAMUSCULAR | Status: AC
  Filled 2016-05-23: qty 2

## 2016-05-23 MED ORDER — LIDOCAINE 2% (20 MG/ML) 5 ML SYRINGE
INTRAMUSCULAR | Status: AC
  Filled 2016-05-23: qty 5

## 2016-05-23 MED ORDER — HYDROCODONE-ACETAMINOPHEN 7.5-325 MG PO TABS: 1.0000 | ORAL_TABLET | ORAL | 0 refills | Status: DC | PRN

## 2016-05-23 MED ORDER — AMITRIPTYLINE HCL 25 MG PO TABS
75.0000 mg | ORAL_TABLET | Freq: Every day | ORAL | Status: DC
  Administered 2016-05-23: 75 mg via ORAL
  Filled 2016-05-23: qty 1

## 2016-05-23 MED ORDER — LIDOCAINE 2% (20 MG/ML) 5 ML SYRINGE
INTRAMUSCULAR | Status: DC | PRN
  Administered 2016-05-23: 100 mg via INTRAVENOUS

## 2016-05-23 MED ORDER — OXYCODONE-ACETAMINOPHEN 5-325 MG PO TABS
1.0000 | ORAL_TABLET | ORAL | Status: DC | PRN
  Administered 2016-05-23 – 2016-05-24 (×3): 2 via ORAL
  Administered 2016-05-24: 1 via ORAL
  Filled 2016-05-23 (×2): qty 2
  Filled 2016-05-23: qty 1
  Filled 2016-05-23: qty 2

## 2016-05-23 MED ORDER — PROPOFOL 10 MG/ML IV BOLUS
INTRAVENOUS | Status: AC
  Filled 2016-05-23: qty 40

## 2016-05-23 MED ORDER — ALUM & MAG HYDROXIDE-SIMETH 200-200-20 MG/5ML PO SUSP: 30.0000 mL | Freq: Four times a day (QID) | ORAL | Status: DC | PRN

## 2016-05-23 MED ORDER — ONDANSETRON HCL 4 MG/2ML IJ SOLN
INTRAMUSCULAR | Status: DC | PRN
  Administered 2016-05-23: 4 mg via INTRAVENOUS

## 2016-05-23 MED ORDER — HYDROCODONE-ACETAMINOPHEN 5-325 MG PO TABS
1.0000 | ORAL_TABLET | ORAL | Status: DC | PRN
  Administered 2016-05-23 – 2016-05-24 (×2): 2 via ORAL
  Filled 2016-05-23 (×3): qty 2

## 2016-05-23 MED ORDER — FLEET ENEMA 7-19 GM/118ML RE ENEM: 1.0000 | ENEMA | Freq: Once | RECTAL | Status: DC | PRN

## 2016-05-23 MED ORDER — SODIUM CHLORIDE 0.9% FLUSH
3.0000 mL | Freq: Two times a day (BID) | INTRAVENOUS | Status: DC
  Administered 2016-05-23: 3 mL via INTRAVENOUS

## 2016-05-23 MED ORDER — DEXAMETHASONE SODIUM PHOSPHATE 10 MG/ML IJ SOLN
INTRAMUSCULAR | Status: DC | PRN
  Administered 2016-05-23: 10 mg via INTRAVENOUS

## 2016-05-23 MED ORDER — ZOLPIDEM TARTRATE 5 MG PO TABS: 5.0000 mg | ORAL_TABLET | Freq: Every evening | ORAL | Status: DC | PRN

## 2016-05-23 MED ORDER — METHOCARBAMOL 500 MG PO TABS
500.0000 mg | ORAL_TABLET | Freq: Four times a day (QID) | ORAL | Status: DC | PRN
  Administered 2016-05-23 – 2016-05-24 (×3): 500 mg via ORAL
  Filled 2016-05-23 (×2): qty 1

## 2016-05-23 MED ORDER — BUPIVACAINE LIPOSOME 1.3 % IJ SUSP
20.0000 mL | INTRAMUSCULAR | Status: DC
  Filled 2016-05-23: qty 20

## 2016-05-23 MED ORDER — SODIUM CHLORIDE 0.9 % IV SOLN: 250.0000 mL | INTRAVENOUS | Status: DC

## 2016-05-23 MED ORDER — LACTATED RINGERS IV SOLN
INTRAVENOUS | Status: DC | PRN
  Administered 2016-05-23 (×3): via INTRAVENOUS

## 2016-05-23 MED ORDER — SUGAMMADEX SODIUM 200 MG/2ML IV SOLN
INTRAVENOUS | Status: DC | PRN
  Administered 2016-05-23 (×2): 170 mg via INTRAVENOUS

## 2016-05-23 MED ORDER — MIDAZOLAM HCL 2 MG/2ML IJ SOLN
INTRAMUSCULAR | Status: AC
  Filled 2016-05-23: qty 2

## 2016-05-23 MED ORDER — MENTHOL 3 MG MT LOZG: 1.0000 | LOZENGE | OROMUCOSAL | Status: DC | PRN

## 2016-05-23 MED ORDER — ACETAMINOPHEN 650 MG RE SUPP: 650.0000 mg | RECTAL | Status: DC | PRN

## 2016-05-23 MED ORDER — BUPIVACAINE HCL (PF) 0.5 % IJ SOLN
INTRAMUSCULAR | Status: AC
  Filled 2016-05-23: qty 30

## 2016-05-23 MED ORDER — ROCURONIUM BROMIDE 50 MG/5ML IV SOSY
PREFILLED_SYRINGE | INTRAVENOUS | Status: AC
  Filled 2016-05-23: qty 5

## 2016-05-23 MED ORDER — ARTIFICIAL TEARS OP OINT
TOPICAL_OINTMENT | OPHTHALMIC | Status: DC | PRN
  Administered 2016-05-23: 1 via OPHTHALMIC

## 2016-05-23 MED ORDER — SUGAMMADEX SODIUM 200 MG/2ML IV SOLN
INTRAVENOUS | Status: AC
  Filled 2016-05-23: qty 2

## 2016-05-23 MED ORDER — EPHEDRINE 5 MG/ML INJ
INTRAVENOUS | Status: AC
  Filled 2016-05-23: qty 10

## 2016-05-23 MED ORDER — DEXAMETHASONE SODIUM PHOSPHATE 10 MG/ML IJ SOLN
INTRAMUSCULAR | Status: AC
  Filled 2016-05-23: qty 1

## 2016-05-23 MED ORDER — PROPOFOL 10 MG/ML IV BOLUS
INTRAVENOUS | Status: DC | PRN
  Administered 2016-05-23: 150 mg via INTRAVENOUS
  Administered 2016-05-23: 50 mg via INTRAVENOUS

## 2016-05-23 MED ORDER — POLYETHYLENE GLYCOL 3350 17 G PO PACK
17.0000 g | PACK | Freq: Every day | ORAL | Status: DC | PRN
  Filled 2016-05-23: qty 1

## 2016-05-23 MED ORDER — CHLORHEXIDINE GLUCONATE 4 % EX LIQD: 60.0000 mL | Freq: Once | CUTANEOUS | Status: DC

## 2016-05-23 MED ORDER — METHOCARBAMOL 1000 MG/10ML IJ SOLN
500.0000 mg | Freq: Four times a day (QID) | INTRAVENOUS | Status: DC | PRN
  Filled 2016-05-23: qty 5

## 2016-05-23 SURGICAL SUPPLY — 52 items
BUR RND FLUTED 2.5 (BURR) IMPLANT
BUR SABER RD CUTTING 3.0 (BURR) ×8 IMPLANT
BUR SABER RD CUTTING 3.0MM (BURR) ×2
CANISTER SUCTION 2500CC (MISCELLANEOUS) ×10 IMPLANT
COVER SURGICAL LIGHT HANDLE (MISCELLANEOUS) ×10 IMPLANT
DECANTER SPIKE VIAL GLASS SM (MISCELLANEOUS) ×5 IMPLANT
DERMABOND ADVANCED (GAUZE/BANDAGES/DRESSINGS) ×4
DERMABOND ADVANCED .7 DNX12 (GAUZE/BANDAGES/DRESSINGS) ×6 IMPLANT
DRAPE INCISE IOBAN 66X45 STRL (DRAPES) IMPLANT
DRAPE MICROSCOPE LEICA (MISCELLANEOUS) ×10 IMPLANT
DRAPE PROXIMA HALF (DRAPES) IMPLANT
DRAPE SURG 17X23 STRL (DRAPES) ×40 IMPLANT
DRSG MEPILEX BORDER 4X4 (GAUZE/BANDAGES/DRESSINGS) ×10 IMPLANT
DRSG MEPILEX BORDER 4X8 (GAUZE/BANDAGES/DRESSINGS) IMPLANT
DURAPREP 26ML APPLICATOR (WOUND CARE) ×10 IMPLANT
ELECT CAUTERY BLADE 6.4 (BLADE) ×10 IMPLANT
ELECT REM PT RETURN 9FT ADLT (ELECTROSURGICAL) ×10
ELECTRODE REM PT RTRN 9FT ADLT (ELECTROSURGICAL) ×6 IMPLANT
EVACUATOR 1/8 PVC DRAIN (DRAIN) IMPLANT
GLOVE BIOGEL PI IND STRL 8 (GLOVE) ×6 IMPLANT
GLOVE BIOGEL PI INDICATOR 8 (GLOVE) ×4
GLOVE ECLIPSE 9.0 STRL (GLOVE) ×10 IMPLANT
GLOVE ORTHO TXT STRL SZ7.5 (GLOVE) ×5 IMPLANT
GLOVE SURG 8.5 LATEX PF (GLOVE) ×10 IMPLANT
GOWN STRL REUS W/ TWL LRG LVL3 (GOWN DISPOSABLE) ×6 IMPLANT
GOWN STRL REUS W/TWL 2XL LVL3 (GOWN DISPOSABLE) ×20 IMPLANT
GOWN STRL REUS W/TWL LRG LVL3 (GOWN DISPOSABLE) ×4
KIT BASIN OR (CUSTOM PROCEDURE TRAY) ×10 IMPLANT
KIT ROOM TURNOVER OR (KITS) ×5 IMPLANT
NEEDLE 22X1 1/2 (OR ONLY) (NEEDLE) ×5 IMPLANT
NEEDLE SPNL 18GX3.5 QUINCKE PK (NEEDLE) ×10 IMPLANT
NS IRRIG 1000ML POUR BTL (IV SOLUTION) ×10 IMPLANT
PACK LAMINECTOMY ORTHO (CUSTOM PROCEDURE TRAY) ×10 IMPLANT
PAD ARMBOARD 7.5X6 YLW CONV (MISCELLANEOUS) ×10 IMPLANT
PATTIES SURGICAL .5 X.5 (GAUZE/BANDAGES/DRESSINGS) IMPLANT
PATTIES SURGICAL .75X.75 (GAUZE/BANDAGES/DRESSINGS) ×5 IMPLANT
PATTIES SURGICAL 1X1 (DISPOSABLE) IMPLANT
SPONGE LAP 4X18 X RAY DECT (DISPOSABLE) IMPLANT
SPONGE SURGIFOAM ABS GEL 100 (HEMOSTASIS) ×10 IMPLANT
SUT VIC AB 0 CT1 27 (SUTURE) ×4
SUT VIC AB 0 CT1 27XBRD ANBCTR (SUTURE) ×6 IMPLANT
SUT VIC AB 1 CT1 27 (SUTURE) ×4
SUT VIC AB 1 CT1 27XBRD ANBCTR (SUTURE) ×6 IMPLANT
SUT VIC AB 2-0 CT1 27 (SUTURE) ×4
SUT VIC AB 2-0 CT1 TAPERPNT 27 (SUTURE) ×6 IMPLANT
SUT VIC AB 3-0 X1 27 (SUTURE) ×10 IMPLANT
SUT VICRYL 0 UR6 27IN ABS (SUTURE) ×5 IMPLANT
SYR CONTROL 10ML LL (SYRINGE) ×10 IMPLANT
TOWEL OR 17X24 6PK STRL BLUE (TOWEL DISPOSABLE) ×10 IMPLANT
TOWEL OR 17X26 10 PK STRL BLUE (TOWEL DISPOSABLE) ×10 IMPLANT
TRAY FOLEY CATH 16FRSI W/METER (SET/KITS/TRAYS/PACK) ×5 IMPLANT
WATER STERILE IRR 1000ML POUR (IV SOLUTION) IMPLANT

## 2016-05-23 NOTE — Anesthesia Postprocedure Evaluation (Signed)
Anesthesia Post Note  Patient: Adan Deckard  Procedure(s) Performed: Procedure(s) (LRB): LEFT L4-L5 LATERAL RECESS DECOMPRESSION WITH CENTRAL AND RIGHT DECOMPRESSION VIA LEFT SIDE (Left) SHOULDER INJECTION (Left) LUMBAR LAMINECTOMY/DECOMPRESSION MICRODISCECTOMY Lumbar five - Sacral One 1 LEVEL (Left)  Patient location during evaluation: PACU Anesthesia Type: General Level of consciousness: sedated and patient cooperative Pain management: pain level controlled Vital Signs Assessment: post-procedure vital signs reviewed and stable Respiratory status: spontaneous breathing Cardiovascular status: stable Anesthetic complications: no       Last Vitals:  Vitals:   05/23/16 1330 05/23/16 1350  BP: 128/66 134/61  Pulse: 81 85  Resp: 13 16  Temp: 36.6 C 36.3 C    Last Pain:  Vitals:   05/23/16 1350  TempSrc: Oral  PainSc:                  Nolon Nations

## 2016-05-23 NOTE — Discharge Instructions (Signed)
° ° °  No lifting greater than 10 lbs. °Avoid bending, stooping and twisting. °Walk in house for first week them may start to get out slowly increasing distance up to one quarter mile by 3 weeks post op. °Keep incision dry for 3 days, may use tegaderm or similar water impervious dressing. ° °

## 2016-05-23 NOTE — H&P (Signed)
Brandy [name] is an 50 y.o. female.   Chief Complaint: low back pain and left le radiculopathy  HPI:  Patient with hx of L5-S1 stenosis and above complaint presents today.  Progressively worsening symptoms.  Failed conservative treatment.    Past Medical History:  Diagnosis Date  . Arthritis   . ASD (atrial septal defect)    s/p closure with Amplatzer device 10/05/04 (Dr. Myriam Jacobson, [facility]) 10/05/04  . GERD (gastroesophageal reflux disease)   . Headache   . Heart murmur    no longer heard  . Legally blind in right eye, as defined in [place]     Past Surgical History:  Procedure Laterality Date  . ABLATION    . BREAST SURGERY    . BUNIONECTOMY    . CARDIAC CATHETERIZATION     10/05/04 [facility]): LM < 25%, otherwise normal coronaries. No pulmonary HTN, Mildly enlarged RV. Secundum ASD s/p closure.  Marland Kitchen CARDIAC SURGERY    . CATARACT EXTRACTION    . CLEFT PALATE REPAIR     s/p cleft lip and palate repair  . TRANSTHORACIC ECHOCARDIOGRAM     12/15/05 [facility]): Mild LVH, EF > [id number], grade 1 diastolic dysfunction, Trivial MR/PR/TR.    Family History  Problem Relation Age of Onset  . Diabetes Mother   . High blood pressure Mother   . Cancer Father    Social History:  reports that she has been smoking Cigarettes.  She has been smoking about 0.50 packs per day. She has never used smokeless tobacco. She reports that she does not drink alcohol or use drugs.  Allergies:  Allergies  Allergen Reactions  . Zithromax [Azithromycin] Shortness Of Breath and Itching    TOTAL BODY ITCHING [EVEN SOLES OF FEET] WHEEZING   . Tramadol Other (See Comments)    Has taken recently without any side effects.    Medications Prior to Admission  Medication Sig Dispense Refill  . acetaminophen (TYLENOL) 500 MG tablet Take 1,000 mg by mouth every 6 (six) hours as needed (for pain.).    Marland Kitchen amitriptyline (ELAVIL) 75 MG tablet TAKE 1 TABLET (75 MG) BY MOUTH AT BEDTIME  6  . diclofenac (VOLTAREN) 75 MG EC  tablet Take 1 tablet (75 mg total) by mouth 2 (two) times daily. 60 tablet 0  . HYDROcodone-acetaminophen (NORCO) 7.5-325 MG tablet Take 1 tablet by mouth every 6 (six) hours as needed for moderate pain. Take in the evening when not working or driving. 40 tablet 0  . methocarbamol (ROBAXIN) 500 MG tablet Take 1 tablet (500 mg total) by mouth 2 (two) times daily. (Patient taking differently: Take 750 mg by mouth 2 (two) times daily as needed for muscle spasms. ) 20 tablet 0  . traMADol (ULTRAM) 50 MG tablet Take 1 tablet (50 mg total) by mouth every 6 (six) hours as needed for moderate pain. 40 tablet 1  . meclizine (ANTIVERT) 50 MG tablet Take 1 tablet (50 mg total) by mouth 3 (three) times daily as needed. (Patient taking differently: Take 50 mg by mouth 3 (three) times daily as needed (for vertigo/dizziness). ) 15 tablet 0  . ondansetron (ZOFRAN-ODT) 8 MG disintegrating tablet DISSOLVE 1 TABLET (8 MG) SUBLINGUAL EVERY 8 HOURS AS NEEDED FOR NAUSEA/VOMITING.  0  . sertraline (ZOLOFT) 100 MG tablet Take 1 tablet (100 mg total) by mouth daily. (Patient not taking: Reported on 04/21/2016) 30 tablet 0    No results found for this or any previous visit (from the past 48 hour(s)).  No results found.  Review of Systems  Constitutional: Negative.   HENT: Negative.   Respiratory: Negative.   Cardiovascular: Negative.   Gastrointestinal: Negative.   Genitourinary: Negative.   Musculoskeletal: Positive for back pain.  Skin: Negative.   Neurological: Positive for tingling.  Endo/Heme/Allergies: Negative.   Psychiatric/Behavioral: Negative.     Blood pressure 118/65, pulse 79, temperature 97.8 F (36.6 C), temperature source Oral, resp. rate 18, weight 188 lb (85.3 kg), SpO2 98 %. Physical Exam  Constitutional: She is oriented to person, place, and time. She appears well-developed. No distress.  Eyes: EOM are normal. Pupils are equal, round, and reactive to light.  Neck: Normal range of motion.   Respiratory: No respiratory distress.  GI: She exhibits no distension.  Musculoskeletal: She exhibits tenderness.  Neurological: She is alert and oriented to person, place, and time.  Skin: Skin is warm and dry.  Psychiatric: She has a normal mood and affect.     Assessment/Plan  L5-S1 stenosis.   Will proceed with LEFT L5-S1 LATERAL RECESS DECOMPRESSION WITH CENTRAL AND RIGHT DECOMPRESSION VIA LEFT SIDE as scheduled.  Surgical procedure along with risks and complications discussed.  All questions answered and wishes to proceed.   Benjiman Core, PA-C 05/23/2016, 7:10 AM Patient examined and lab reviewed with Ricard Dillon, PA-C.

## 2016-05-23 NOTE — Progress Notes (Signed)
Brandy [name] is complaining of persistent left shoulder pain with lying on the left side and with over head use of the left arm, lifting and while clothing. Her exam shows postive impingement left shoulder. No significant weakness with ROM of the neck pain free and without stiffness. Findings are consistent with an impingement of the left shoulder with bursitis and tendonitis. Plan to perform a subacromial space injection with marcaine and depomedrol 5:1 cc while she is  Under anesthesia.

## 2016-05-23 NOTE — Anesthesia Procedure Notes (Signed)
Procedure Name: Intubation Date/Time: 05/23/2016 7:39 AM Performed by: Trixie Deis A Pre-anesthesia Checklist: Patient identified, Emergency Drugs available, Suction available and Patient being monitored Patient Re-evaluated:Patient Re-evaluated prior to inductionOxygen Delivery Method: Circle System Utilized Preoxygenation: Pre-oxygenation with 100% oxygen Intubation Type: IV induction Ventilation: Mask ventilation without difficulty Laryngoscope Size: Mac and 3 Grade View: Grade I Tube type: Oral Tube size: 7.5 mm Number of attempts: 1 Airway Equipment and Method: Stylet and Oral airway Placement Confirmation: ETT inserted through vocal cords under direct vision,  positive ETCO2 and breath sounds checked- equal and bilateral Secured at: 22 cm Tube secured with: Tape Dental Injury: Teeth and Oropharynx as per pre-operative assessment

## 2016-05-23 NOTE — Brief Op Note (Addendum)
05/23/2016  7:43 AM  PATIENT:  Brandy [name]  50 y.o. female  PRE-OPERATIVE DIAGNOSIS:  left and right L4-5 lateral recess stenosis and left greater then right L5-S1 lumbar spinal stenosis. Left shoulder rotator cuff tendonitis.   POST-OPERATIVE DIAGNOSIS:  Same.  PROCEDURE:  Procedure(s): LEFT L5-S1 LATERAL RECESS DECOMPRESSION WITH CENTRAL AND RIGHT DECOMPRESSION VIA LEFT SIDE (N/A) Initially Left shoulder subacromial injection with marcaine and depomedrol.Incorrect Left L4-5 lateral recess decompression with central decompression and right sided decompression from the left side.  SURGEON:  Surgeon(s) and Role:    * [name], MD - Primary  PHYSICIAN ASSISTANT: Benjiman Core, PA-C  ANESTHESIA:   general, supplemented with local anesthesia marcaine 0.5% 5CC left shoulder and marcaine 0.5% 1:1 exparel 1.3% 25CC Dr. Lissa Hoard  EBL: <50cc  BLOOD ADMINISTERED:none   COMPLICATION: Incorrect left and central L4-5 level performed, then correct left and central with right side via the left L5-S1 level was carried out after discussion with this patient's spouse Mr. Cathlean Sauer. Mr. Davis Gourd was made aware of the incorrect level surgery and agreed with performing the correct level surgery.   DRAINS:Foley placed intraoperatively, discontinued at the end of the case.  LOCAL MEDICATIONS USED:  MARCAINE0.5% 5 CC left should, Marcaine 0.5% 1:1 exparel 1.3% 25 CC lumbar incision,  Amount: 30 ml  SPECIMEN:  No Specimen  DISPOSITION OF SPECIMEN:  N/A  COUNTS:  YES  DICTATION: .Dragon Dictation  PLAN OF CARE: Admit for overnight observation  PATIENT DISPOSITION:  PACU - hemodynamically stable.   Delay start of Pharmacological VTE agent (>24hrs) due to surgical blood loss or risk of bleeding: yes

## 2016-05-23 NOTE — Transfer of Care (Signed)
Immediate Anesthesia Transfer of Care Note  Patient: Brandy [name]  Procedure(s) Performed: Procedure(s) with comments: LEFT L4-L5 LATERAL RECESS DECOMPRESSION WITH CENTRAL AND RIGHT DECOMPRESSION VIA LEFT SIDE (Left) SHOULDER INJECTION (Left) - band-aid per pa-c LUMBAR LAMINECTOMY/DECOMPRESSION MICRODISCECTOMY Lumbar five - Sacral One 1 LEVEL (Left)  Patient Location: PACU  Anesthesia Type:General  Level of Consciousness: awake, alert  and oriented  Airway & Oxygen Therapy: Patient Spontanous Breathing and Patient connected to nasal cannula oxygen  Post-op Assessment: Report given to RN, Post -op Vital signs reviewed and stable and Patient moving all extremities  Post vital signs: Reviewed and stable  Last Vitals:  Vitals:   05/23/16 0633  BP: 118/65  Pulse: 79  Resp: 18  Temp: 36.6 C    Last Pain:  Vitals:   05/23/16 0633  TempSrc: Oral      Patients Stated Pain Goal: 2 ([id number])  Complications: No apparent anesthesia complications

## 2016-05-23 NOTE — Op Note (Addendum)
05/23/2016  9:16 AM  PATIENT:  Brandy [name]  50 y.o. female  MRN: [id number]  OPERATIVE REPORT  PRE-OPERATIVE DIAGNOSIS:Left greater then right L5-S1 lumbar spinal stenosis. Left and right L4-5 lateral recess stenosis and left greater then right L5-S1 lumbar spinal stenosis. Left shoulder rotator cuff tendonitis.  POST-OPERATIVE DIAGNOSIS:Left greater then right L5-S1 lumbar spinal stenosis. Left and right L4-5 lateral recess stenosis and left greater than right L5-S1 lumbar spinal stenosis.Left shoulder rotator cuff tenodonitis.  PROCEDURE:  Procedure(s): LEFT L5-S1 LATERAL RECESS DECOMPRESSION WITH CENTRAL AND RIGHT DECOMPRESSION VIA LEFT SIDE SHOULDER INJECTION Initially Left shoulder subacromial injection with marcaine and depomedrol. Incorrect Left L4-5 lateral recess decompression with central decompression and right sided decompression from the left side.     SURGEON:  [name], MD     ASSISTANT:  Benjiman Core, PA-C  (Present throughout the entire procedure and necessary for completion of procedure in a timely manner)     ANESTHESIA:  General,supplemented with local anesthesia marcaine 0.5% 5CC left shoulder and marcaine 0.5% 1:1 exparel 1.3% 25CC Dr. Lissa Hoard  EBL: [id number]    COMPLICATIONS:  Incorrect left and central L4-5 level performed, then correct left and central with right side via the left L5-S1 level was carried out after discussion with this patient's spouse Mr. Cathlean Sauer. Mr. Davis Gourd was made aware of the incorrect level surgery and agreed with performing the correct level surgery.   DRAINS: Foley placed intraoperatively, discontinued at the end of the case.      PROCEDURE:The patient was met in the holding area, and the appropriate left Lumbar level L4-5 identified and marked with "x" and my initials. The appropriate left shoulder marked with "x" and my initials.The patient was then transported to OR and was placed under general  anesthesia without difficulty. The patient received appropriate preoperative antibiotic prophylaxis.The patient after intubation atraumatically was transferred to the operating room table, prone position, Wilson frame, sliding OR table. All pressure points were well padded. The arms in 90-90 well-padded at the elbows.The left shoulder identified and prepped with iodoform and using gloves a 22G needle inserted sterilely via a posterolateral subacromial approach and 1 1/2 inch needle place, a total of 4 cc of marcaine 0.5% and 1 cc of Depomedrol 21m/cc injected into the left shoulder subacromial bursa and subacromial space.  Standard prep with DuraPrep solution lower dorsal spine to the mid sacral segment. Draped in the usual manner iodine Vi-Drape was used. Time-out procedure was called and correct.The skin at the expected L4-5 level was then infiltrated with  local anesthesia marcaine 0.5% 1:1 exparel 1.3% total 10CC . An incision approximately an inch inch and a half in length was then made through skin and subcutaneous layers in line with the left side of the expected midline just superior to the spinal needle entry point. An incision made into the left lumbosacral fascia approximately an inch in length .  Cobb elevator was then introduced into the incision site and used to carefully form subperiosteal movement of the left paralumbar muscles off of the posterior lamina of the expected L4-5 level. The depth measured off of the elevator at about 50 mm and 50 mm retractors and placed on the scaffolding for the Boss Mccollough retractor and guided down to and docking on the posterior aspect of the lamina at the expected L4-5 level. This was sterilely attached to the articulating arm and it's up right which had been attached the OR table sterilely. Lateral radiograph identified a  Penfield #4 at the appropriate level L4-5. The operating room microscope sterilely draped brought into the field. Under the operating room  microscope, the left L4-5 interspace carefully debrided the small amount of muscle attachment here and high-speed bur used to drill the medial aspect of the inferior articular process of L4 approximately 10%.Using the 3 mm Kerrison to debrided the ligamentum flavum attachment. Foraminotomy was then performed over the L5 nerve root. The medial 10% superior articular process of L5 and then resected using an osteotome and 2 mm Kerrison. This allowed for identification of the thecal sac. Penfield 4 was then used to carefully mobilize the thecal sac medially and the L5 nerve root identified within the lateral recess flattened over the posterior aspect of the L4-5 disc. The ligamentum flavum then removed off the ventral aspect of the inferior left L4 lamina. Carefully the lateral aspect of the L5 nerve root was identified and a Penfield 4 was used to mobilize the nerve medially such that the left lateral recess and left L5 nerve root was visible with microscope. Using a Penfield 4 for dissection the medial facet bone attached ligamentum flavum was then removed. Then the thecal sac was retracted using a Derricho retractor. Further foraminotomies was performed over the leftL4 nerve root the nerve root was noted to be decompressed. The L5 nerve root able to be retracted along the medial aspect of the L5 pedicle. Ligamentum flavum was further debrided superiorly to the level L4-5 disc. Had a moderate amount of further resection of the L5 lamina inferiorly was performed.  Ligamentum flavum was debrided and lateral recess along the medial aspect L4-5 facet no further decompression was necessary. Ball tip nerve probe was then able to carefully palpate the neuroforamen for L4 and L5 finding these to be well decompressed. Attention then turned to the central and right L4-5 level which was easily visualized with the microscope. Soft tissues debrided about the posterior aspect of the left medial L4-5  Interspace laminotomy.  High-speed bur and then used to carefully drill ventral left spinous process left side L4 lamina. The superior margin of the L4 lamina then carefully debrided with 3 mm kerrison then used to enter the spinal canal over the superior aspect of the thecal sac resecting bone over the superior aspect and freeing up the attachment of ligamentum flavum here. Ligamentum flavum then debrided with the 2 mm and 3 mm Kerrisons we decompressed the central L4-5 level no significant bleeding encountered.  Irrigation carried careful examination demonstrated no active bleeding present. Retractors were then carefully removed Bleeding was then controlled using thrombin-soaked Gelfoam small cottonoids. Small amount of bleeding within the soft tissue mass the laminotomy area was controlled using bipolar electrocautery. Irrigation was carried out using copious amounts of irrigant solution. All Gelfoam were then removed. No significant active bleeding present at the time of removal. All instruments sponge counts were correct traction system was then carefully removed carefully rotating retractors with this withdrawal and only bipolar electrocautery of any small bleeders. Lumbodorsal fascia was then carefully approximated with interrupted 0 Vicryl sutures, UR 6 needle deep subcutaneous layers were approximated with interrupted 0 Vicryl sutures on UR 6 the appear subcutaneous layers approximated with interrupted 2-0 Vicryl sutures and the skin closed with a running subcutaneous stitch of 4-0 Vicryl. Dermabond was applied allowed to dry and then Mepilex bandage applied. While this patient was undergoing closure of her incision site I had the left the room to perform paperwork and filling out the patient's documentation. At  that time I realized that patient had been permitted for a left sided L5-S1 lateral recess decompression with central and right-sided decompression via the left L5-S1 level.  Review the patient's documentation office  note indicated that this was correct. I spoke with Mr. Cathlean Sauer patient's spouse as she was still under general anesthetic. Incision had been closed and Dermabond applied which was dried with a bandage in place. With discussion with Mr. Cathlean Sauer I informed him of the incorrect surgery level performed and indicated that I would go ahead and perform surgery at the correct level. He indicated that he wished to for [place] to do so. Surgery then was performed of the correct left L5-S1 level.   Patient was present on the OR table in the prone position on the Wilson frame sliding OR table. Discussion regarding current antibiotics noted it did not need to be repeated until another 2 hours. Sterile OR instrumentation was obtained and the patient's lumbar area reprepped performed with iodoform after removal of the Dermabond from the skin incision site using a combination of alcohol and acetone and Hollister adhesive remover. A Foley catheter was placed as determined to be appropriate anesthesia. This was later removed at the end of the procedure. Foley catheter was placed in sterile condition the patient was in a prone position. Standard prep with DuraPrep solution and draped in the usual manner iodine Vi-Drape was placed. Standard timeout protocol was carried out and correct. The incision then made in the midline through the old incision scar dividing the subcutaneous sutures and removing sutures with hemostat. The incision was carried inferiorly and additional quarter inch. All subcutaneous sutures and sutures of the deep fascial layer were removed. Irrigation was then carried out and cerebellar retractor placed. Cobb elevators then used to elevate the left paralumbar muscles off of the L5 and S1 levels. Sharp dissection was used to expose the left L5-S1 level and boss McCollough retractors inserted using 60 mm retractor and 2 medial posts. A 3 mm round cutting bur then used to thin the left medial take  another process of L5 and the inferior lamina of L5. 3 and 4 mm Kerrisons used to resect the thinned portion of the lamina and medial inferior articular process bone leaving the ligamentum flavum intact. The operating room microscope sterilely draped and brought into the field. The laminotomy on the left side at L5-S1 was carried superiorly to the attachment of the ligamentum flavum to the inferior and deep aspect of the left L5 lamina. Freeing the left ligamentum flavum off the L5 lamina. Pituitary rongeur is used to thin the superficial portions of ligamentum flavum and then a millimeter Kerrison used to resect the mental flava loss of the left aspect of the inferior articular process and supine of L5 and superior articular process of S1. Ligamentum flavum was then resected along the left side. Osteotome was used to resect a small portion of the medial aspect of the inferior articular process of L5 and the the superior articular process of S1. Lateral recess was decompressed and the thecal sac then retracted medially with a Derricho retractor. Penfield 4 was then placed and intraoperative lateral radiograph demonstrated the L4 at the left L5-S1 level. Just at the level of the S1 pedicle. Decompression of the left lateral recess at L5-S1 was carried out laterally using Kerrisons and a high-speed bur in order to thin the facet to allow resection then to the medial border of the S1 pedicle and the entry point of the  S1 nerve root. Ligamentum flavum then was reflected off the medial aspect of the L5-S1 facet was resected superiorly until a probe could be passed out the L5 and S1 neuroforamen demonstrating no compression in these areas. High-speed bur was then used to drill the ventral and medial aspect of the left L5 lamina and the base of the spinous process inferiorly. A D'Errico retractor used to retract the thecal sac by resecting the ligamentum flavum centrally for the ventral aspect of the L5 lamina centrally  and to the right side. The ligamentum flavum attachment to the superior aspect of S1 lamina was similarly resected the midline and then to the right side compressing the central portions of the canal of the ligamentum flavum here. Continued resection of ligamentum flavum was carried out to the right side and the medial aspect of the L5-S1 facet turning and rotating the table to allow for adequate exposure of the area using the operating room microscope. With this then the central and right side of the canal had been decompressed to compressed as well as left side a Woodson nerve probe was able to be passed out both S1 neuroforamen and both L5 neuroforamen demonstrating their patency. Irrigation was carried out there is no sign of further compression occurring here. There was no active bleeding present and used the L4-5 or L5-S1 level.Retractors were then carefully removed Bleeding was then controlled using thrombin-soaked Gelfoam small cottonoids. Small amount of bleeding within the soft tissue mass the laminotomy area was controlled using bipolar electrocautery. Irrigation was carried out using copious amounts of irrigant solution. All Gelfoam were then removed. No significant active bleeding present at the time of removal. All instruments sponge counts were then McCollough retraction system was then carefully removed carefully rotating retractors with this withdrawal and only bipolar electrocautery of any small bleeders. Lumbodorsal fascia was then carefully approximated with interrupted 0 Vicryl sutures, UR 6 needle deep subcutaneous layers were approximated with interrupted 0 Vicryl sutures on UR 6 the appear subcutaneous layers approximated with interrupted 2-0 Vicryl sutures and the skin closed with a running subcutaneous stitch of 4-0 Vicryl. Dermabond was applied allowed to dry and then Mepilex bandage applied. Foley catheter was then removed after the patient was returned to a supine position. Patient was  then reactivated extubated and returned to recovery room in satisfactory condition all instrument and sponge counts were correct.   Benjiman Core, PA-C perform the duties of assistant surgeon during this case. he was present from the beginning of the case to the end of the case assisting in transfer the patient from his stretcher to the OR table and back to the stretcher at the end of the case. He preformed the left sided shoulder subacromial space injection. He assisted in careful retraction and suction of the laminectomy site delicate neural structures operating under the operating room microscope. He performed closure of the incision from the fascia to the skin applying the dressing.        [name]  05/23/2016, 9:16 AM

## 2016-05-23 NOTE — Interval H&P Note (Signed)
History and Physical Interval Note:  05/23/2016 7:25 AM  Brandy [name]  has presented today for surgery, with the diagnosis of left and right L5-S1 lateral recess stenosis  The various methods of treatment have been discussed with the patient and family. After consideration of risks, benefits and other options for treatment, the patient has consented to  Procedure(s): LEFT L5-S1 LATERAL RECESS DECOMPRESSION WITH CENTRAL AND RIGHT DECOMPRESSION VIA LEFT SIDE (N/A) as a surgical intervention .  The patient's history has been reviewed, patient examined, no change in status, stable for surgery.  I have reviewed the patient's chart and labs.  Questions were answered to the patient's satisfaction.     [name]

## 2016-05-24 ENCOUNTER — Encounter (HOSPITAL_COMMUNITY): Payer: Self-pay | Admitting: Specialist

## 2016-05-24 DIAGNOSIS — M48062 Spinal stenosis, lumbar region with neurogenic claudication: Secondary | ICD-10-CM | POA: Diagnosis not present

## 2016-05-24 DIAGNOSIS — M7582 Other shoulder lesions, left shoulder: Secondary | ICD-10-CM | POA: Diagnosis not present

## 2016-05-24 LAB — CBC
HCT: 34.9 % — ABNORMAL LOW (ref 36.0–46.0)
HEMOGLOBIN: 11.4 g/dL — AB (ref 12.0–15.0)
MCH: 31.3 pg (ref 26.0–34.0)
MCHC: 32.7 g/dL (ref 30.0–36.0)
MCV: 95.9 fL (ref 78.0–100.0)
Platelets: 303 10*3/uL (ref 150–400)
RBC: 3.64 MIL/uL — AB (ref 3.87–5.11)
RDW: 14.6 % (ref 11.5–15.5)
WBC: 15.6 10*3/uL — ABNORMAL HIGH (ref 4.0–10.5)

## 2016-05-24 LAB — BASIC METABOLIC PANEL
Anion gap: 8 (ref 5–15)
BUN: 12 mg/dL (ref 6–20)
CHLORIDE: 103 mmol/L (ref 101–111)
CO2: 28 mmol/L (ref 22–32)
Calcium: 8.8 mg/dL — ABNORMAL LOW (ref 8.9–10.3)
Creatinine, Ser: 0.98 mg/dL (ref 0.44–1.00)
GFR calc Af Amer: >60 mL/min (ref >60)
GFR calc non Af Amer: >60 mL/min (ref >60)
Glucose, Bld: 119 mg/dL — ABNORMAL HIGH (ref 65–99)
POTASSIUM: 4.5 mmol/L (ref 3.5–5.1)
SODIUM: 139 mmol/L (ref 135–145)

## 2016-05-24 NOTE — Evaluation (Signed)
Occupational Therapy Evaluation/Discharge Patient Details Name: Brandy [name] MRN: [id number] DOB: 1965-09-10 Today's Date: 05/24/2016    History of Present Illness Admitted with back pain/sciatica; now s/p L4-5 lateral recess decompression, L5S1 laminectomy, L shoulder injection;  has a past medical history of Arthritis; ASD (atrial septal defect); Heart murmur; and Legally blind in right eye;    Clinical Impression   PTA, pt wad independent with ADL and functional mobility and was working as a Marine scientist. Pt currently requires supervision with LB ADL and tub transfers. All pt and family education complete concerning safe use of DME for tub transfers, back precautions, dressing techniques, fall prevention, and log roll for bed mobility. Pt demonstrates a good understanding of precautions. Pt ready for D/C home with 24 hour assistance/supervision from an OT standpoint. She reports understanding and no further questions/concerns. No further acute OT needs identified. OT will sign off.    Follow Up Recommendations  No OT follow up;Supervision/Assistance - 24 hour    Equipment Recommendations  None recommended by OT       Precautions / Restrictions Precautions Precautions: Back Precaution Booklet Issued: No Precaution Comments: Handout in room; reviewed  Restrictions Weight Bearing Restrictions: No      Mobility Bed Mobility Overal bed mobility: Modified Independent             General bed mobility comments: Correct log roll technique  Transfers Overall transfer level: Needs assistance Equipment used: Rolling walker (2 wheeled) Transfers: Sit to/from Stand Sit to Stand: Supervision         General transfer comment: Cues needed only for hand placement    Balance Overall balance assessment: No apparent balance deficits (not formally assessed)                                          ADL Overall ADL's : Needs assistance/impaired     Grooming:  Supervision/safety;Standing   Upper Body Bathing: Set up;Sitting   Lower Body Bathing: Supervison/ safety;Sit to/from stand   Upper Body Dressing : Set up;Sitting   Lower Body Dressing: Supervision/safety;Sit to/from stand   Toilet Transfer: Supervision/safety;Ambulation;RW   Toileting- Clothing Manipulation and Hygiene: Supervision/safety;Sit to/from stand   Tub/ Banker: Tub transfer;3 in 1;Min guard;Rolling walker   Functional mobility during ADLs: Rolling walker;Supervision/safety General ADL Comments: Educated pt on back precautions during ADL, grooming techniques, dressing techniques, bathing techniques, and safe tub transfers with 3-in-1.      Vision Vision Assessment?: No apparent visual deficits   Perception     Praxis      Pertinent Vitals/Pain Pain Assessment: 0-10 Pain Score: 4  Pain Location: back Pain Descriptors / Indicators: Operative site guarding Pain Intervention(s): Limited activity within patient's tolerance;Monitored during session;Premedicated before session;Repositioned     Hand Dominance Right   Extremity/Trunk Assessment Upper Extremity Assessment Upper Extremity Assessment: Overall WFL for tasks assessed;LUE deficits/detail LUE Deficits / Details: Pt with WFL AROM but painful throughout movement. Difficulty with reaching at times.   Lower Extremity Assessment Lower Extremity Assessment: Defer to PT evaluation       Communication Communication Communication: No difficulties   Cognition Arousal/Alertness: Awake/alert Behavior During Therapy: WFL for tasks assessed/performed Overall Cognitive Status: Within Functional Limits for tasks assessed                     General Comments       Exercises  Shoulder Instructions      Home Living Family/patient expects to be discharged to:: Private residence Living Arrangements: Spouse/significant other Available Help at Discharge: Family Type of Home: House Home  Access: Stairs to enter [employer] of Steps: 5 Entrance Stairs-Rails: Right;Left Home Layout: Two level;Able to live on main level with bedroom/bathroom     Bathroom Shower/Tub: Tub/shower unit Shower/tub characteristics: Architectural technologist: Standard Bathroom Accessibility: Yes How Accessible: Accessible via walker Home Equipment: [place] - 2 wheels;Bedside commode;Wheelchair - manual;Grab bars - tub/shower          Prior Functioning/Environment Level of Independence: Independent        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        OT Problem List: Decreased strength;Decreased range of motion;Decreased activity tolerance;Impaired balance (sitting and/or standing);Decreased safety awareness;Decreased knowledge of use of DME or AE;Decreased knowledge of precautions   OT Treatment/Interventions:      OT Goals(Current goals can be found in the care plan section) Acute Rehab OT Goals Patient Stated Goal: back to work OT Goal Formulation: With patient/family Time For Goal Achievement: 05/31/16 Potential to Achieve Goals: Good  OT Frequency:     Barriers to D/C:            Co-evaluation              End of Session Equipment Utilized During Treatment: Gait belt;Rolling walker  Activity Tolerance: Patient tolerated treatment well Patient left: in chair;with family/visitor present;with call bell/phone within reach   Time: 1142-1205 OT Time Calculation (min): 23 min Charges:  OT General Charges $OT Visit: 1 Procedure OT Evaluation $OT Eval Moderate Complexity: 1 Procedure OT Treatments $Self Care/Home Management : 8-22 mins G-Codes: OT G-codes **NOT FOR INPATIENT CLASS** Functional Assessment Tool Used: clinical judgement Functional Limitation: Self care Self Care Current Status [id number]): At least 1 percent but less than 20 percent impaired, limited or restricted Self Care Goal Status [id number]): At least 1 percent but less than 20 percent impaired, limited or  restricted Self Care Discharge Status [phone]): At least 1 percent but less than 20 percent impaired, limited or restricted  Norman Herrlich, OTR/L [phone] 05/24/2016, 2:27 PM

## 2016-05-24 NOTE — Progress Notes (Signed)
Pt ready for discharge. Education/instructions reviewed and all questions/concerns addressed, IV removed and belongings gathered. Pt transported out via wheelchair to husband's vehicle.

## 2016-05-24 NOTE — Progress Notes (Signed)
     Subjective: 1 Day Post-Op Procedure(s) (LRB): LEFT L4-L5 LATERAL RECESS DECOMPRESSION WITH CENTRAL AND RIGHT DECOMPRESSION VIA LEFT SIDE (Left) SHOULDER INJECTION (Left) LUMBAR LAMINECTOMY/DECOMPRESSION MICRODISCECTOMY Lumbar five - Sacral One 1 LEVEL (Left) Awake,alert and out of bed standing at the bathroom door. I'm ready to go. No leg numbness or weakness. PT/OT recommend for eventual Out pt PT.  Patient reports pain as moderate.    Objective:   VITALS:  Temp:  [97.4 F (36.3 C)-98.6 F (37 C)] 98.3 F (36.8 C) (12/19 0649) Pulse Rate:  [78-107] 98 (12/19 0649) Resp:  [13-17] 16 (12/19 0649) BP: (120-134)/(53-69) 131/69 (12/19 0649) SpO2:  [96 %-100 %] 97 % (12/19 0649)  Neurologically intact ABD soft Neurovascular intact Sensation intact distally Intact pulses distally Dorsiflexion/Plantar flexion intact Incision: no drainage   LABS  Recent Labs  05/24/16 0527  HGB 11.4*  WBC 15.6*  PLT 303    Recent Labs  05/24/16 0527  NA 139  K 4.5  CL 103  CO2 28  BUN 12  CREATININE 0.98  GLUCOSE 119*   No results for input(s): LABPT, INR in the last 72 hours.   Assessment/Plan: 1 Day Post-Op Procedure(s) (LRB): LEFT L4-L5 LATERAL RECESS DECOMPRESSION WITH CENTRAL AND RIGHT DECOMPRESSION VIA LEFT SIDE (Left) SHOULDER INJECTION (Left) LUMBAR LAMINECTOMY/DECOMPRESSION MICRODISCECTOMY Lumbar five - Sacral One 1 LEVEL (Left)  Advance diet Up with therapy Discharge home with home health  [place] to discharge home today.  Brandy [name] 05/24/2016, 1:11 PM Patient ID: Brandy [name], female   DOB: 07-Aug-1965, 50 y.o.   MRN: [id number]

## 2016-05-24 NOTE — Progress Notes (Signed)
     Subjective: 1 Day Post-Op Procedure(s) (LRB): LEFT L4-L5 LATERAL RECESS DECOMPRESSION WITH CENTRAL AND RIGHT DECOMPRESSION VIA LEFT SIDE (Left) SHOULDER INJECTION (Left) LUMBAR LAMINECTOMY/DECOMPRESSION MICRODISCECTOMY Lumbar five - Sacral One 1 LEVEL (Left)Awake, alert and oriented x 4, saw patient last evening and discuss wrong level surgery with her and she understands that I performed surgery at the L4-5 level and then did surgery at the planned level L5-S1 after discussion with her spouse. She understands the event. Today she has not been seen by PT as yet, complains of some mild discomfort left lateral ankle, S1 pattern. Has been up and to the bathroom. Tolerating po diet. No numbness or weakness.  Patient reports pain as moderate.    Objective:   VITALS:  Temp:  [97 F (36.1 C)-98.6 F (37 C)] 98.3 F (36.8 C) (12/19 0649) Pulse Rate:  [78-107] 98 (12/19 0649) Resp:  [10-25] 16 (12/19 0649) BP: (120-144)/(53-78) 131/69 (12/19 0649) SpO2:  [96 %-100 %] 97 % (12/19 0649)  Neurologically intact ABD soft Neurovascular intact Sensation intact distally Intact pulses distally Dorsiflexion/Plantar flexion intact Incision: scant drainage and new dressing applied. IV still in place. Lab reviewed and stable.   LABS  Recent Labs  05/24/16 0527  HGB 11.4*  WBC 15.6*  PLT 303    Recent Labs  05/24/16 0527  NA 139  K 4.5  CL 103  CO2 28  BUN 12  CREATININE 0.98  GLUCOSE 119*   No results for input(s): LABPT, INR in the last 72 hours.   Assessment/Plan: 1 Day Post-Op Procedure(s) (LRB): LEFT L4-L5 LATERAL RECESS DECOMPRESSION WITH CENTRAL AND RIGHT DECOMPRESSION VIA LEFT SIDE (Left) SHOULDER INJECTION (Left) LUMBAR LAMINECTOMY/DECOMPRESSION MICRODISCECTOMY Lumbar five - Sacral One 1 LEVEL (Left)  Advance diet Up with therapy Discharge home with home health  I will return after she has therapy and evaluate her situation, should be  Able to be discharged  today post therapy. Will check her at about noon.  Brandy [name] 05/24/2016, 10:19 AM Patient ID: Brandy [name], female   DOB: May 08, 1966, 50 y.o.   MRN: [id number]

## 2016-05-24 NOTE — Evaluation (Signed)
Physical Therapy Evaluation Patient Details Name: Brandy [name] MRN: [id number] DOB: 08/26/1965 Today's Date: 05/24/2016   History of Present Illness  Admitted with back pain/sciatica; now s/p L4-5 lateral recess decompression, L5S1 laminectomy, L shoulder injection;  has a past medical history of Arthritis; ASD (atrial septal defect); Heart murmur; and Legally blind in right eye;   Clinical Impression   Patient evaluated by Physical Therapy with no further acute PT needs identified. All education has been completed and the patient has no further questions. Overall managing quite well; OK for dc home from PT standpoint;  See below for any follow-up Physical Therapy or equipment needs. PT is signing off. Thank you for this referral.     Follow Up Recommendations Outpatient PT  The potential need for Outpatient PT can be addressed at Ortho follow-up appointments.     Equipment Recommendations  None recommended by PT    Recommendations for Other Services       Precautions / Restrictions Precautions Precautions: Back      Mobility  Bed Mobility Overal bed mobility: Modified Independent             General bed mobility comments: Correct log roll technique  Transfers Overall transfer level: Needs assistance Equipment used: Rolling walker (2 wheeled) Transfers: Sit to/from Stand Sit to Stand: Supervision         General transfer comment: Cues needed only for hand placement  Ambulation/Gait Ambulation/Gait assistance: Supervision;Modified independent (Device/Increase time) Ambulation Distance (Feet): 550 Feet Assistive device: Rolling walker (2 wheeled) Gait Pattern/deviations: Step-through pattern     General Gait Details: Cues to self-monitor for activity tolerance  Stairs Stairs: Yes Stairs assistance: Modified independent (Device/Increase time) Stair Management: One rail Left;Step to pattern;Sideways Number of Stairs: 5 General stair comments: no  difficulty  Wheelchair Mobility    Modified Rankin (Stroke Patients Only)       Balance                                             Pertinent Vitals/Pain Pain Assessment: 0-10 Pain Score: 3  Pain Location: back Pain Descriptors / Indicators: Operative site guarding Pain Intervention(s): Premedicated before session    Home Living Family/patient expects to be discharged to:: Private residence Living Arrangements: Spouse/significant other Available Help at Discharge: Family Type of Home: House Home Access: Stairs to enter Entrance Stairs-Rails: Psychiatric nurse of Steps: 5 Home Layout: Two level;Able to live on main level with bedroom/bathroom Home Equipment: Gilford Rile - 2 wheels;Bedside commode;Wheelchair - manual      Prior Function Level of Independence: Independent         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        Extremity/Trunk Assessment   Upper Extremity Assessment Upper Extremity Assessment: Overall WFL for tasks assessed    Lower Extremity Assessment Lower Extremity Assessment: Overall WFL for tasks assessed;Generalized weakness (slow moving secondary to anticipation of pain)       Communication   Communication: No difficulties  Cognition Arousal/Alertness: Awake/alert Behavior During Therapy: WFL for tasks assessed/performed Overall Cognitive Status: Within Functional Limits for tasks assessed                      General Comments General comments (skin integrity, edema, etc.): Reviewed precatuions    Exercises     Assessment/Plan    PT  Assessment All further PT needs can be met in the next venue of care  PT Problem List Decreased strength;Pain;Decreased mobility          PT Treatment Interventions      PT Goals (Current goals can be found in the Care Plan section)  Acute Rehab PT Goals Patient Stated Goal: back to work PT Goal Formulation: All assessment and education complete, DC  therapy    Frequency     Barriers to discharge        Co-evaluation               End of Session   Activity Tolerance: Patient tolerated treatment well Patient left: in chair;with call bell/phone within reach Nurse Communication: Mobility status         Time: 3267-1245 PT Time Calculation (min) (ACUTE ONLY): 23 min   Charges:   PT Evaluation $PT Eval Low Complexity: 1 Procedure PT Treatments $Gait Training: 8-22 mins   PT G CodesColletta Maryland 05/24/2016, 11:26 AM  Roney Marion, [place] Pager [phone] Office [phone]

## 2016-06-01 NOTE — Progress Notes (Signed)
Physical Therapy Note  (Late entry for G Code correction)   2016-06-20 1300  PT G-Codes **NOT FOR INPATIENT CLASS**  Functional Assessment Tool Used Clinical Judgement  Functional Limitation Mobility: Walking and moving around  Mobility: Walking and Moving Around Current Status [id number]) CI  Mobility: Walking and Moving Around Goal Status [id number]) CH  Mobility: Walking and Moving Around Discharge Status [id number]) CI   Roney Marion, [place] Pager [phone] Office [phone]

## 2016-06-02 NOTE — Discharge Summary (Signed)
Physician Discharge Summary      Patient ID: Brandy [name] MRN: [id number] DOB/AGE: [birth year] 52 y.o.  Admit date: 05/23/2016 Discharge date: 05/24/2016  Admission Diagnoses:  Principal Problem:   Spinal stenosis, lumbar region with neurogenic claudication Active Problems:   Left shoulder tendonitis   Post-op pain   Discharge Diagnoses:  Same  Past Medical History:  Diagnosis Date  . Arthritis   . ASD (atrial septal defect)    s/p closure with Amplatzer device 10/05/04 (Dr. Myriam Jacobson, [facility]) 10/05/04  . GERD (gastroesophageal reflux disease)   . Headache   . Heart murmur    no longer heard  . Legally blind in right eye, as defined in [place]     Surgeries: Procedure(s): LEFT L4-L5 LATERAL RECESS DECOMPRESSION WITH CENTRAL AND RIGHT DECOMPRESSION VIA LEFT SIDE SHOULDER INJECTION LUMBAR LAMINECTOMY/DECOMPRESSION MICRODISCECTOMY Lumbar five - Sacral One 1 LEVEL on 05/23/2016   Consultants:   Discharged Condition: Improved  Hospital Course: Brandy [name] is an 50 y.o. female who was admitted 05/23/2016 with a chief complaint of No chief complaint on file. , and found to have a diagnosis of Spinal stenosis, lumbar region with neurogenic claudication.  She was brought to the operating room on 05/23/2016 and underwent the above named procedures.    She was given perioperative antibiotics:  Anti-infectives    Start     Dose/Rate Route Frequency Ordered Stop   05/23/16 1530  ceFAZolin (ANCEF) IVPB 1 g/50 mL premix     1 g 100 mL/hr over 30 Minutes Intravenous Every 8 hours 05/23/16 1347 05/23/16 2214   05/23/16 0532  ceFAZolin (ANCEF) IVPB 2g/100 mL premix     2 g 200 mL/hr over 30 Minutes Intravenous On call to O.R. 05/23/16 0532 05/23/16 0732    As noted in this patients Op note she underwent surgery at both the left L4-5 and L5-S1 level. The surgery at the L4-5 level was unplanned and the decision to proceed with performing the planned surgery at the left  L5-S1 level was discussed with her spouse, Mr. Cathlean Sauer while she was under anesthesia. Following the surgery she was recovered uneventfully in the PACU and transferred to [facility] floor [place], There she recovered. Foley catheter was placed at the end of the procedure. This was discontinued and on POD#1 she was awake alert and oriented x4. The surgery performed at the unplanned level was discussed with Brandy [name] and she is aware that this was an error In her surgery. I expect no long term consequence of the surgery. On POD#1 the surgery on the unplanned L4-5 level was again discussed. The dressing changed and incision showed no sign of drainage erythrema of fluctuance. There was no sign of infection. A new dressing was applied and after PT the patient reevaluated. Her LE motor was normal, she had some complaints of low back and left buttock pain, improved with oral narcotics. Her vital signs remained normal and she was tolerating po nourishment and narcotic for pain relief. She was voiding without difficulty. She was discharged home on POD#1.  She was given sequential compression devices and early ambulation for DVT prophylaxis.  She benefited maximally from their hospital stay and there were no complications.    Recent vital signs:  Vitals:   05/24/16 0001 05/24/16 0649  BP: (!) 121/53 131/69  Pulse: (!) 107 98  Resp: 16 16  Temp: 98.6 F (37 C) 98.3 F (36.8 C)    Recent laboratory studies:  Results for orders placed  or performed during the hospital encounter of 05/23/16  CBC  Result Value Ref Range   WBC 15.6 (H) 4.0 - 10.5 K/uL   RBC 3.64 (L) 3.87 - 5.11 MIL/uL   Hemoglobin 11.4 (L) 12.0 - 15.0 g/dL   HCT 34.9 (L) 36.0 - 46.0 %   MCV 95.9 78.0 - 100.0 fL   MCH 31.3 26.0 - 34.0 pg   MCHC 32.7 30.0 - 36.0 g/dL   RDW 14.6 11.5 - 15.5 %   Platelets 303 150 - 400 K/uL  Basic Metabolic Panel  Result Value Ref Range   Sodium 139 135 - 145 mmol/L   Potassium 4.5 3.5 -  5.1 mmol/L   Chloride 103 101 - 111 mmol/L   CO2 28 22 - 32 mmol/L   Glucose, Bld 119 (H) 65 - 99 mg/dL   BUN 12 6 - 20 mg/dL   Creatinine, Ser 0.98 0.44 - 1.00 mg/dL   Calcium 8.8 (L) 8.9 - 10.3 mg/dL   GFR calc non Af Amer >60 >60 mL/min   GFR calc Af Amer >60 >60 mL/min   Anion gap 8 5 - 15    Discharge Medications:   Allergies as of 05/24/2016      Reactions   Zithromax [azithromycin] Shortness Of Breath, Itching   TOTAL BODY ITCHING [EVEN SOLES OF FEET] WHEEZING   Tramadol Other (See Comments)   Has taken recently without any side effects.      Medication List    STOP taking these medications   ondansetron 8 MG disintegrating tablet Commonly known as:  ZOFRAN-ODT   traMADol 50 MG tablet Commonly known as:  ULTRAM     TAKE these medications   acetaminophen 500 MG tablet Commonly known as:  TYLENOL Take 1,000 mg by mouth every 6 (six) hours as needed (for pain.).   amitriptyline 75 MG tablet Commonly known as:  ELAVIL TAKE 1 TABLET (75 MG) BY MOUTH AT BEDTIME   diclofenac 75 MG EC tablet Commonly known as:  VOLTAREN Take 1 tablet (75 mg total) by mouth 2 (two) times daily.   HYDROcodone-acetaminophen 7.5-325 MG tablet Commonly known as:  NORCO Take 1-2 tablets by mouth every 4 (four) hours as needed for moderate pain. Take in the evening when not working or driving. What changed:  how much to take  when to take this   meclizine 50 MG tablet Commonly known as:  ANTIVERT Take 1 tablet (50 mg total) by mouth 3 (three) times daily as needed. What changed:  reasons to take this   methocarbamol 500 MG tablet Commonly known as:  ROBAXIN Take 1 tablet (500 mg total) by mouth 3 (three) times daily. What changed:  when to take this   sertraline 100 MG tablet Commonly known as:  ZOLOFT Take 1 tablet (100 mg total) by mouth daily.       Diagnostic Studies: Dg Lumbar Spine 2-3 Views  Result Date: 05/23/2016 CLINICAL DATA:  Intraoperative localization  for L5-S1 decompression. EXAM: LUMBAR SPINE - 2-3 VIEW COMPARISON:  Lumbar spine MRI on 03/06/2016 FINDINGS: Intraoperative crosstable lateral 1 shows a probe overlying the posterior interspinous space at L4-5. Intraoperative cross-table lateral 2 shows posterior retractors with a probe overlying the spinal canal at the level L4-5. IMPRESSION: Intraoperative localization of L4-5. These results were called by telephone at the time of interpretation on 05/23/2016 at 10:38 am to the [place], who verbally acknowledged these results and confirmed that Dr. Louanne Skye was aware these results. Electronically Signed  By: Earle Gell M.D.   On: 05/23/2016 10:39   Dg Lumbar Spine 1 View  Result Date: 05/23/2016 CLINICAL DATA:  Left L4-5 decompression. EXAM: LUMBAR SPINE - 1 VIEW COMPARISON:  05/23/2016 intraoperative lumbar spine radiographs . FINDINGS: There is a posterior approach surgical marking device with the tip overlying the spinal canal at the level of the L5-S1 disc. Additional surgical instruments overlie the posterior lower back soft tissues at the L4-S1 levels. IMPRESSION: Posterior approach surgical marking device position as described. Electronically Signed   By: Ilona Sorrel M.D.   On: 05/23/2016 10:57    Disposition: 01-Home or Self Care  Discharge Instructions    Call MD / Call 911    Complete by:  As directed    If you experience chest pain or shortness of breath, CALL 911 and be transported to the hospital emergency room.  If you develope a fever above 101 F, pus (white drainage) or increased drainage or redness at the wound, or calf pain, call your surgeon's office.   Constipation Prevention    Complete by:  As directed    Drink plenty of fluids.  Prune juice may be helpful.  You may use a stool softener, such as Colace (over the counter) 100 mg twice a day.  Use MiraLax (over the counter) for constipation as needed.   Diet - low sodium heart healthy    Complete by:  As directed     Discharge instructions    Complete by:  As directed    No lifting greater than 10 lbs. Avoid bending, stooping and twisting. Walk in house for first week them may start to get out slowly increasing distance up to one quarter mile by 3 weeks post op. Keep incision dry for 3 days, may use tegaderm or similar water impervious dressing.   Driving restrictions    Complete by:  As directed    No driving for 3 weeks   Increase activity slowly as tolerated    Complete by:  As directed    Lifting restrictions    Complete by:  As directed    No lifting for 8 weeks      Follow-up Information    [name], MD In 2 weeks.   Specialty:  Orthopedic Surgery Why:  For wound re-check Contact information: [place] [address] [phone]            Signed: [name] 06/02/2016, 10:02 AM

## 2016-06-03 ENCOUNTER — Other Ambulatory Visit: Payer: Self-pay | Admitting: Physician Assistant

## 2016-06-03 DIAGNOSIS — N63 Unspecified lump in unspecified breast: Secondary | ICD-10-CM

## 2016-06-09 ENCOUNTER — Encounter (INDEPENDENT_AMBULATORY_CARE_PROVIDER_SITE_OTHER): Payer: Self-pay | Admitting: Specialist

## 2016-06-09 ENCOUNTER — Ambulatory Visit (INDEPENDENT_AMBULATORY_CARE_PROVIDER_SITE_OTHER): Payer: Self-pay | Admitting: Specialist

## 2016-06-09 ENCOUNTER — Ambulatory Visit (INDEPENDENT_AMBULATORY_CARE_PROVIDER_SITE_OTHER): Payer: PRIVATE HEALTH INSURANCE

## 2016-06-09 VITALS — BP 129/77 | HR 85 | Ht 67.0 in | Wt 188.0 lb

## 2016-06-09 DIAGNOSIS — M48062 Spinal stenosis, lumbar region with neurogenic claudication: Secondary | ICD-10-CM | POA: Diagnosis not present

## 2016-06-09 DIAGNOSIS — M5116 Intervertebral disc disorders with radiculopathy, lumbar region: Secondary | ICD-10-CM

## 2016-06-09 MED ORDER — OXYCODONE-ACETAMINOPHEN 5-325 MG PO TABS: 1.0000 | ORAL_TABLET | Freq: Four times a day (QID) | ORAL | 0 refills | Status: DC | PRN

## 2016-06-09 MED ORDER — METHOCARBAMOL 500 MG PO TABS: 500.0000 mg | ORAL_TABLET | Freq: Three times a day (TID) | ORAL | 0 refills | Status: DC | PRN

## 2016-06-09 NOTE — Patient Instructions (Signed)
You  should avoid twisting, bending, lifting.  No strenuous activity.

## 2016-06-09 NOTE — Progress Notes (Signed)
Post-Op Visit Note   Patient: Brandy [name]           Date of Birth: [birth year]           MRN: [id number] Visit Date: 06/09/2016 PCP: [place]:  Chief Complaint:  [name] is 2 weeks and 3 days out from LEFT L5-S1 LATERAL RECESS DECOMPRESSION WITH CENTRAL AND RIGHT DECOMPRESSION VIA LEFT SIDE SHOULDER INJECTIONInitially Left shoulder subacromial injection with marcaine and depomedrol. Incorrect Left L4-5 lateral recess decompression with central decompression and right sided decompression from the left side.  She states that she is still having pain in the same place. Preoperative pain is better. Needs  refill of pain medication and muscle relaxer.   Visit Diagnoses:  1. Spinal stenosis of lumbar region with neurogenic claudication   2. Lumbar disc herniation with radiculopathy     Plan: Advised patient that she should avoid bending, twisting, lifting. Gradually increase walking distances over the next few weeks. No aggressive activity. Refilled Percocet and Robaxin. Follow-up 4 weeks for recheck.  Follow-Up Instructions: Return in about 4 weeks (around 07/07/2016).   Orders:  Orders Placed This Encounter  Procedures  . XR Lumbar Spine 2-3 Views   Meds ordered this encounter  Medications  . oxyCODONE-acetaminophen (ROXICET) 5-325 MG tablet    Sig: Take 1 tablet by mouth every 6 (six) hours as needed for severe pain.    Dispense:  60 tablet    Refill:  0  . methocarbamol (ROBAXIN) 500 MG tablet    Sig: Take 1 tablet (500 mg total) by mouth every 8 (eight) hours as needed for muscle spasms.    Dispense:  60 tablet    Refill:  0     PMFS History: Patient Active Problem List   Diagnosis Date Noted  . 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 Left shoulder tendonitis 05/23/2016    Class: Acute  . Post-op pain 05/23/2016   Past Medical History:  Diagnosis Date  .  Arthritis   . ASD (atrial septal defect)    s/p closure with Amplatzer device 10/05/04 (Dr. Myriam [facility]son, [facility]) 10/05/04  . GERD (gastroesophageal reflux disease)   . Headache   . Heart murmur    no longer heard  . Legally blind in right eye, as defined in [place]     Family History  Problem Relation Age of Onset  . Diabetes Mother   . High blood pressure Mother   . Cancer Father     Past Surgical History:  Procedure Laterality Date  . ABLATION    . BREAST SURGERY    . BUNIONECTOMY    . CARDIAC CATHETERIZATION     10/05/04 [facility]): LM < 25%, otherwise normal coronaries. No pulmonary HTN, Mildly enlarged RV. Secundum ASD s/p closure.  Marland Kitchen CARDIAC SURGERY    . CATARACT EXTRACTION    . CLEFT PALATE REPAIR     s/p cleft lip and palate repair  . LUMBAR LAMINECTOMY/DECOMPRESSION MICRODISCECTOMY Left 05/23/2016   Procedure: LEFT L4-L5 LATERAL RECESS DECOMPRESSION WITH CENTRAL AND RIGHT DECOMPRESSION VIA LEFT SIDE;  Surgeon: [name], MD;  Location: [place];  Service: Orthopedics;  Laterality: Left;  . LUMBAR LAMINECTOMY/DECOMPRESSION MICRODISCECTOMY Left 05/23/2016   Procedure: LUMBAR LAMINECTOMY/DECOMPRESSION MICRODISCECTOMY Lumbar five - Sacral One 1 LEVEL;  Surgeon: [name], MD;  Location: [place];  Service: Orthopedics;  Laterality: Left;  . SHOULDER INJECTION Left  05/23/2016   Procedure: SHOULDER INJECTION;  Surgeon: [name], MD;  Location: [place];  Service: Orthopedics;  Laterality: Left;  band-aid per pa-c  . TRANSTHORACIC ECHOCARDIOGRAM     12/15/05 [facility]): Mild LVH, EF > [id number], grade 1 diastolic dysfunction, Trivial MR/PR/TR.   Social History   Occupational History  . Not on file.   Social History Main Topics  . Smoking status: Current Every Day Smoker    Packs/day: 0.50    Types: Cigarettes  . Smokeless tobacco: Never Used  . Alcohol use No  . Drug use: No  . Sexual activity: Not on file

## 2016-06-14 ENCOUNTER — Other Ambulatory Visit: Payer: 59

## 2016-06-24 ENCOUNTER — Inpatient Hospital Stay: Admission: RE | Admit: 2016-06-24 | Payer: 59 | Source: Ambulatory Visit

## 2016-07-18 ENCOUNTER — Encounter (INDEPENDENT_AMBULATORY_CARE_PROVIDER_SITE_OTHER): Payer: Self-pay | Admitting: Specialist

## 2016-07-18 ENCOUNTER — Ambulatory Visit (INDEPENDENT_AMBULATORY_CARE_PROVIDER_SITE_OTHER): Payer: Self-pay

## 2016-07-18 ENCOUNTER — Encounter (INDEPENDENT_AMBULATORY_CARE_PROVIDER_SITE_OTHER): Payer: Self-pay

## 2016-07-18 ENCOUNTER — Ambulatory Visit (INDEPENDENT_AMBULATORY_CARE_PROVIDER_SITE_OTHER): Payer: Self-pay | Admitting: Specialist

## 2016-07-18 VITALS — BP 117/69 | HR 101 | Ht 67.0 in | Wt 188.0 lb

## 2016-07-18 DIAGNOSIS — G8929 Other chronic pain: Secondary | ICD-10-CM

## 2016-07-18 DIAGNOSIS — M25512 Pain in left shoulder: Secondary | ICD-10-CM

## 2016-07-18 DIAGNOSIS — M545 Low back pain: Secondary | ICD-10-CM

## 2016-07-18 MED ORDER — METHYLPREDNISOLONE 2 MG PO TABS: ORAL_TABLET | ORAL | 0 refills | Status: DC

## 2016-07-18 MED ORDER — HYDROCODONE-ACETAMINOPHEN 5-325 MG PO TABS: 1.0000 | ORAL_TABLET | Freq: Four times a day (QID) | ORAL | 0 refills | Status: DC | PRN

## 2016-07-18 NOTE — Progress Notes (Signed)
Office Visit Note   Patient: Brandy [name]           Date of Birth: 29-Apr-1966           MRN: [id number] Visit Date: 07/18/2016              Requested by: Georga Hacking Ur[facility] [address] [place], [place] [address] PCP: [place]   Assessment & Plan: Visit Diagnoses:  1. Chronic midline low back pain, with sciatica presence unspecified   2. Chronic left shoulder pain     Plan: For ongoing shoulder pain with failed conservative treatment we'll schedule MRI to rule out rotator cuff tear. Follow-up the office after completion to discuss results for back pain patient was given a Medrol Dosepak 6 day taper to be taken as directed and also did refill Norco. We'll see how her back is doing when she returns to review left shoulder MRI. Still avoid twisting and bending heavy lifting. Given a note to continue out of work until at least 12 weeks postop.  Follow-Up Instructions: No Follow-up on file.   Orders:  Orders Placed This Encounter  Procedures  . XR Lumbar Spine 2-3 Views  . XR Shoulder Left  . MR Shoulder Left w/o contrast   No orders of the defined types were placed in this encounter.     Procedures: No procedures performed   Clinical Data: No additional findings.   Subjective: Chief Complaint  Patient presents with  . Lower Back - Routine Post Op    Brandy [name] is 8 weeks post op of Left L4-L5 Lateral recess decompression with central and right decompression, lumbar laminectomy /decompression microdiscectomy L5-S1 on hte left.  She states that she feels like she is bak to where she started at.  She has not had any new injury to er back.   She had an injection at the time of surgery in her Left shoulder.  She states that it has worn off now and the pain is returning.  She is not complaining of any lower extremity radicular symptoms who states that is much better since her surgery. Complain of more pain in the low back with  movement. No injury. Left shoulder continues to be bothered with overhead reaching and going behind her back. She's been treating this conservatively and we have previously performed a subacromial Marcaine/Depo-Medrol injection while under anesthesia for her back surgery.  Review of Systems  Constitutional: Negative.   HENT: Negative.   Respiratory: Negative.   Musculoskeletal: Positive for back pain.  Hematological: Negative.   Psychiatric/Behavioral: Negative.      Objective: Vital Signs: BP 117/69 (BP Location: Left Arm, Patient Position: Sitting)   Pulse (!) 101   Ht 5\' 7"  (1.702 m)   Wt 188 lb (85.3 kg)   BMI 29.44 kg/m   Physical Exam  Constitutional: She is oriented to person, place, and time. No distress.  HENT:  Head: Normocephalic and atraumatic.  Eyes: Pupils are equal, round, and reactive to light.  Neck: Normal range of motion.  Pulmonary/Chest: No respiratory distress.  Abdominal: She exhibits no distension.  Musculoskeletal:  Exam left shoulder she has some limitation in range of motion with flexion/abduction. Passive flexion to about 130 140 with marked discomfort. Positive impingement test. Negative drop arm but she does have pain and weakness with supraspinatus resistance. Lumbar paraspinal tenderness. Negative logroll. Negative straight leg raise.  mild tenderness over the left hip greater trochanter bursa. Neurovascularly intact. No  focal motor deficits.  Neurological: She is alert and oriented to person, place, and time.  Skin: Skin is warm and dry.  Psychiatric: She has a normal mood and affect.    Ortho Exam  Specialty Comments:  No specialty comments available.  Imaging: No results found.   PMFS History: Patient Active Problem List   Diagnosis Date Noted  . 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 Left shoulder tendonitis 05/23/2016    Class: Acute  . Post-op pain 05/23/2016   Past Medical  History:  Diagnosis Date  . Arthritis   . ASD (atrial septal defect)    s/p closure with Amplatzer device 10/05/04 (Dr. Myriam Jacobson, [facility]) 10/05/04  . GERD (gastroesophageal reflux disease)   . Headache   . Heart murmur    no longer heard  . Legally blind in right eye, as defined in [place]     Family History  Problem Relation Age of Onset  . Diabetes Mother   . High blood pressure Mother   . Cancer Father     Past Surgical History:  Procedure Laterality Date  . ABLATION    . BREAST SURGERY    . BUNIONECTOMY    . CARDIAC CATHETERIZATION     10/05/04 [facility]): LM < 25%, otherwise normal coronaries. No pulmonary HTN, Mildly enlarged RV. Secundum ASD s/p closure.  Marland Kitchen CARDIAC SURGERY    . CATARACT EXTRACTION    . CLEFT PALATE REPAIR     s/p cleft lip and palate repair  . LUMBAR LAMINECTOMY/DECOMPRESSION MICRODISCECTOMY Left 05/23/2016   Procedure: LEFT L4-L5 LATERAL RECESS DECOMPRESSION WITH CENTRAL AND RIGHT DECOMPRESSION VIA LEFT SIDE;  Surgeon: [name], MD;  Location: [place];  Service: Orthopedics;  Laterality: Left;  . LUMBAR LAMINECTOMY/DECOMPRESSION MICRODISCECTOMY Left 05/23/2016   Procedure: LUMBAR LAMINECTOMY/DECOMPRESSION MICRODISCECTOMY Lumbar five - Sacral One 1 LEVEL;  Surgeon: [name], MD;  Location: [place];  Service: Orthopedics;  Laterality: Left;  . SHOULDER INJECTION Left 05/23/2016   Procedure: SHOULDER INJECTION;  Surgeon: [name], MD;  Location: [place];  Service: Orthopedics;  Laterality: Left;  band-aid per pa-c  . TRANSTHORACIC ECHOCARDIOGRAM     12/15/05 [facility]): Mild LVH, EF > XX123456, grade 1 diastolic dysfunction, Trivial MR/PR/TR.   Social History   Occupational History  . Not on file.   Social History Main Topics  . Smoking status: Current Every Day Smoker    Packs/day: 0.50    Types: Cigarettes  . Smokeless tobacco: Never Used  . Alcohol use No  . Drug use: No  . Sexual activity: Not on file

## 2016-07-26 ENCOUNTER — Ambulatory Visit
Admission: RE | Admit: 2016-07-26 | Discharge: 2016-07-26 | Disposition: A | Discharge status: Home / Self Care | Payer: 59 | Source: Ambulatory Visit | Attending: Surgery | Admitting: Surgery

## 2016-07-26 DIAGNOSIS — M25512 Pain in left shoulder: Principal | ICD-10-CM

## 2016-07-26 DIAGNOSIS — G8929 Other chronic pain: Secondary | ICD-10-CM

## 2016-08-08 ENCOUNTER — Encounter (INDEPENDENT_AMBULATORY_CARE_PROVIDER_SITE_OTHER): Payer: Self-pay | Admitting: Orthopedic Surgery

## 2016-08-08 ENCOUNTER — Ambulatory Visit (INDEPENDENT_AMBULATORY_CARE_PROVIDER_SITE_OTHER): Payer: PRIVATE HEALTH INSURANCE | Admitting: Orthopedic Surgery

## 2016-08-08 DIAGNOSIS — M25512 Pain in left shoulder: Secondary | ICD-10-CM

## 2016-08-08 NOTE — Progress Notes (Signed)
Office Visit Note   Patient: Brandy [name]           Date of Birth: 06-Feb-1966           MRN: [id number] Visit Date: 08/08/2016 Requested by: Georga Hacking Urgent [address] [place], [place] [address] PCP: [place]  Subjective: Chief Complaint  Patient presents with  . Left Shoulder - Pain, Follow-up    HPI: Brandy [name] is a 51 year old female with left shoulder pain.  She's here to review the MRI scan.  I did review the MRI scan with her and it does show some bursitis but no rotator cuff tear no labral pathology.  She still is having some pain which she localizes to the deltoid region.  She is a Marine scientist at [employer].  She has had an injection which helped her previously.              ROS: All systems reviewed are negative as they relate to the chief complaint within the history of present illness.  Patient denies  fevers or chills.   Assessment & Plan: Visit Diagnoses:  1. Left shoulder pain, unspecified chronicity     Plan: Impression is left shoulder bursitis with normal MRI scan which does not show any definitively operative pathology.  Plan is injection today.  Below shoulder level rotator cuff strengthening.  Follow-up with me as needed  Follow-Up Instructions: No Follow-up on file.   Orders:  No orders of the defined types were placed in this encounter.  No orders of the defined types were placed in this encounter.     Procedures: Large Joint Inj Date/Time: 08/10/2016 8:23 AM Performed by: Meredith Pel Authorized by: Meredith Pel   Consent Given by:  Patient Site marked: the procedure site was marked   Timeout: prior to procedure the correct patient, procedure, and site was verified   Indications:  Pain and diagnostic evaluation Location:  Shoulder Site:  L subacromial bursa Prep: patient was prepped and draped in usual sterile fashion   Needle Size:  18 G Needle Length:  1.5 inches Approach:  Posterior Ultrasound  Guidance: No   Fluoroscopic Guidance: No   Arthrogram: No   Medications:  5 mL lidocaine 1 %; 9 mL bupivacaine 0.5 %; 40 mg methylPREDNISolone acetate 40 MG/ML Aspiration Attempted: No   Patient tolerance:  Patient tolerated the procedure well with no immediate complications     Clinical Data: No additional findings.  Objective: Vital Signs: There were no vitals taken for this visit.  Physical Exam:   Constitutional: Patient appears well-developed HEENT:  Head: Normocephalic Eyes:EOM are normal Neck: Normal range of motion Cardiovascular: Normal rate Pulmonary/chest: Effort normal Neurologic: Patient is alert Skin: Skin is warm Psychiatric: Patient has normal mood and affect    Ortho Exam: Left shoulder exam demonstrates full active and passive range of motion with positive impingement signs no definitive acromioclavicular joint tenderness left versus right.  Not much in the way of asymmetric pain with crossarm adduction.  No other masses lymph adenopathy or skin changes noted in the left shoulder girdle region.  Neck range of motion is full.  Motor sensory function to the hand is intact.  O'Brien's testing negative impingement signs positive on the left no restriction of external rotation at 15 of abduction and left versus right  Specialty Comments:  No specialty comments available.  Imaging: No results found.   PMFS History: Patient Active Problem List   Diagnosis Date Noted  .  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 Left shoulder tendonitis 05/23/2016    Class: Acute  . Post-op pain 05/23/2016   Past Medical History:  Diagnosis Date  . Arthritis   . ASD (atrial septal defect)    s/p closure with Amplatzer device 10/05/04 (Dr. Myriam Jacobson, [facility]) 10/05/04  . GERD (gastroesophageal reflux disease)   . Headache   . Heart murmur    no longer heard  . Legally blind in right eye, as defined in [place]       Family History  Problem Relation Age of Onset  . Diabetes Mother   . High blood pressure Mother   . Cancer Father     Past Surgical History:  Procedure Laterality Date  . ABLATION    . BREAST SURGERY    . BUNIONECTOMY    . CARDIAC CATHETERIZATION     10/05/04 [facility]): LM < 25%, otherwise normal coronaries. No pulmonary HTN, Mildly enlarged RV. Secundum ASD s/p closure.  Marland Kitchen CARDIAC SURGERY    . CATARACT EXTRACTION    . CLEFT PALATE REPAIR     s/p cleft lip and palate repair  . LUMBAR LAMINECTOMY/DECOMPRESSION MICRODISCECTOMY Left 05/23/2016   Procedure: LEFT L4-L5 LATERAL RECESS DECOMPRESSION WITH CENTRAL AND RIGHT DECOMPRESSION VIA LEFT SIDE;  Surgeon: [name], MD;  Location: [place];  Service: Orthopedics;  Laterality: Left;  . LUMBAR LAMINECTOMY/DECOMPRESSION MICRODISCECTOMY Left 05/23/2016   Procedure: LUMBAR LAMINECTOMY/DECOMPRESSION MICRODISCECTOMY Lumbar five - Sacral One 1 LEVEL;  Surgeon: [name], MD;  Location: [place];  Service: Orthopedics;  Laterality: Left;  . SHOULDER INJECTION Left 05/23/2016   Procedure: SHOULDER INJECTION;  Surgeon: [name], MD;  Location: [place];  Service: Orthopedics;  Laterality: Left;  band-aid per pa-c  . TRANSTHORACIC ECHOCARDIOGRAM     12/15/05 [name]): Mild LVH, EF > XX123456, grade 1 diastolic dysfunction, Trivial MR/PR/TR.   Social History   Occupational History  . Not on file.   Social History Main Topics  . Smoking status: Current Every Day Smoker    Packs/day: 0.50    Types: Cigarettes  . Smokeless tobacco: Never Used  . Alcohol use No  . Drug use: No  . Sexual activity: Not on file

## 2016-08-10 DIAGNOSIS — M25512 Pain in left shoulder: Secondary | ICD-10-CM

## 2016-08-10 MED ORDER — BUPIVACAINE HCL 0.5 % IJ SOLN
9.0000 mL | INTRAMUSCULAR | Status: AC | PRN
  Administered 2016-08-10: 9 mL via INTRA_ARTICULAR

## 2016-08-10 MED ORDER — METHYLPREDNISOLONE ACETATE 40 MG/ML IJ SUSP
40.0000 mg | INTRAMUSCULAR | Status: AC | PRN
  Administered 2016-08-10: 40 mg via INTRA_ARTICULAR

## 2016-08-10 MED ORDER — LIDOCAINE HCL 1 % IJ SOLN
5.0000 mL | INTRAMUSCULAR | Status: AC | PRN
  Administered 2016-08-10: 5 mL

## 2016-08-25 ENCOUNTER — Encounter (INDEPENDENT_AMBULATORY_CARE_PROVIDER_SITE_OTHER): Payer: Self-pay | Admitting: Specialist

## 2016-08-25 ENCOUNTER — Encounter (INDEPENDENT_AMBULATORY_CARE_PROVIDER_SITE_OTHER): Payer: Self-pay

## 2016-08-25 ENCOUNTER — Ambulatory Visit (INDEPENDENT_AMBULATORY_CARE_PROVIDER_SITE_OTHER): Payer: Self-pay | Admitting: Specialist

## 2016-08-25 ENCOUNTER — Ambulatory Visit (INDEPENDENT_AMBULATORY_CARE_PROVIDER_SITE_OTHER): Payer: PRIVATE HEALTH INSURANCE

## 2016-08-25 VITALS — BP 129/72 | HR 102 | Ht 67.0 in | Wt 188.0 lb

## 2016-08-25 DIAGNOSIS — M5442 Lumbago with sciatica, left side: Secondary | ICD-10-CM

## 2016-08-25 DIAGNOSIS — G8929 Other chronic pain: Secondary | ICD-10-CM

## 2016-08-25 NOTE — Progress Notes (Signed)
Office Visit Note   Patient: Brandy [name]           Date of Birth: [birth year]           MRN: [id number] Visit Date: 08/25/2016              Requested by: Georga Hacking Urgent [place], [place] [address] PCP: [place]   Assessment & Plan: Visit Diagnoses:  1. Chronic bilateral low back pain with left-sided sciatica     Plan: Patient follow up in 4 weeks for recheck. If she still continues to be symptomatic we did discuss possibly repeating her MRI lumbar spine. Gradually increase walking distances. Nothing too aggressive. All questions answered.  Follow-Up Instructions: Return in about 4 weeks (around 09/22/2016).   Orders:  Orders Placed This Encounter  Procedures  . XR Lumbar Spine 2-3 Views   No orders of the defined types were placed in this encounter.     Procedures: No procedures performed   Clinical Data: No additional findings.   Subjective: Chief Complaint  Patient presents with  . Lower Back - Pain, Follow-up  . Left Shoulder - Follow-up    Ms. [name] is here for follow up on her back and left shoulder.  She states that back is still hurting her and she occasionally has pain down her legs.  She is still taking the pain medication.  She did she Dr. Marlou Sa for her shoulder but feels like it did no good.  Still quite of central low back pain. Occasional radiation down her left leg although she does states that her left leg is improved from preop. She continues to have left shoulder pain with overhead activity and reaching behind. She did not have any relief with recent shoulder injection done by Dr. Marlou Sa.  Review of Systems  Constitutional: Negative.   Respiratory: Positive for wheezing.   Genitourinary: Negative.   Musculoskeletal: Positive for back pain.  Psychiatric/Behavioral: Negative.      Objective: Vital Signs: BP 129/72 (BP Location: Left Arm, Patient Position: Sitting)   Pulse (!) 102   Ht  5\' 7"  (1.702 m)   Wt 188 lb (85.3 kg)   BMI 29.44 kg/m   Physical Exam  Constitutional: No distress.  HENT:  Head: Normocephalic and atraumatic.  Musculoskeletal:  Negative logroll bilateral hips. Negative straight leg raise.Marland Kitchen Neurovascularly intact. Tender over the left hip greater trochanter bursa.  Neurological: She is alert.  Skin: Skin is warm and dry.    Ortho Exam  Specialty Comments:  No specialty comments available.  Imaging: Xr Lumbar Spine 2-3 Views  Result Date: 08/25/2016 X-rays lumbar spine shows multilevel spondylosis. No change from previous x-ray.    PMFS History: Patient Active Problem List   Diagnosis Date Noted  . 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 Left shoulder tendonitis 05/23/2016    Class: Acute  . Post-op pain 05/23/2016   Past Medical History:  Diagnosis Date  . Arthritis   . ASD (atrial septal defect)    s/p closure with Amplatzer device 10/05/04 (Dr. Myriam Jacobson, [facility]) 10/05/04  . GERD (gastroesophageal reflux disease)   . Headache   . Heart murmur    no longer heard  . Legally blind in right eye, as defined in [place]     Family History  Problem Relation Age of Onset  . Diabetes Mother   . High blood pressure Mother   .  Cancer Father     Past Surgical History:  Procedure Laterality Date  . ABLATION    . BREAST SURGERY    . BUNIONECTOMY    . CARDIAC CATHETERIZATION     10/05/04 [facility]): LM < 25%, otherwise normal coronaries. No pulmonary HTN, Mildly enlarged RV. Secundum ASD s/p closure.  Marland Kitchen CARDIAC SURGERY    . CATARACT EXTRACTION    . CLEFT PALATE REPAIR     s/p cleft lip and palate repair  . LUMBAR LAMINECTOMY/DECOMPRESSION MICRODISCECTOMY Left 05/23/2016   Procedure: LEFT L4-L5 LATERAL RECESS DECOMPRESSION WITH CENTRAL AND RIGHT DECOMPRESSION VIA LEFT SIDE;  Surgeon: [name], MD;  Location: [place];  Service: Orthopedics;  Laterality: Left;  . LUMBAR  LAMINECTOMY/DECOMPRESSION MICRODISCECTOMY Left 05/23/2016   Procedure: LUMBAR LAMINECTOMY/DECOMPRESSION MICRODISCECTOMY Lumbar five - Sacral One 1 LEVEL;  Surgeon: [name], MD;  Location: [place];  Service: Orthopedics;  Laterality: Left;  . SHOULDER INJECTION Left 05/23/2016   Procedure: SHOULDER INJECTION;  Surgeon: [name], MD;  Location: [place];  Service: Orthopedics;  Laterality: Left;  band-aid per pa-c  . TRANSTHORACIC ECHOCARDIOGRAM     12/15/05 [facility]): Mild LVH, EF > 25%, grade 1 diastolic dysfunction, Trivial MR/PR/TR.   Social History   Occupational History  . Not on file.   Social History Main Topics  . Smoking status: Current Every Day Smoker    Packs/day: 0.50    Types: Cigarettes  . Smokeless tobacco: Never Used  . Alcohol use No  . Drug use: No  . Sexual activity: Not on file

## 2016-09-09 ENCOUNTER — Telehealth (INDEPENDENT_AMBULATORY_CARE_PROVIDER_SITE_OTHER): Payer: Self-pay | Admitting: Specialist

## 2016-09-09 NOTE — Telephone Encounter (Signed)
Patient called needing Rx refilled for pain medicine and muscle relaxer. Patient said the pain medicine does not need to be as strong as vicodin. Patient also need a refill on her muscle relaxer.

## 2016-09-09 NOTE — Telephone Encounter (Signed)
Patient called needing Rx refilled for pain medicine and muscle relaxer. Patient said the pain medicine does not need to be as strong as vicodin. Patient also need a refill on her muscle relaxer. The number to contact patient is [phone]

## 2016-09-12 ENCOUNTER — Other Ambulatory Visit (INDEPENDENT_AMBULATORY_CARE_PROVIDER_SITE_OTHER): Payer: Self-pay | Admitting: Specialist

## 2016-09-12 MED ORDER — METHOCARBAMOL 500 MG PO TABS: 500.0000 mg | ORAL_TABLET | Freq: Three times a day (TID) | ORAL | 0 refills | Status: DC

## 2016-09-12 MED ORDER — TRAMADOL-ACETAMINOPHEN 37.5-325 MG PO TABS: 1.0000 | ORAL_TABLET | Freq: Four times a day (QID) | ORAL | 0 refills | Status: DC | PRN

## 2016-09-14 ENCOUNTER — Other Ambulatory Visit (INDEPENDENT_AMBULATORY_CARE_PROVIDER_SITE_OTHER): Payer: Self-pay | Admitting: Specialist

## 2016-09-14 NOTE — Telephone Encounter (Signed)
She was seen with her husband and rx for muscle relaxer and for discomfort prescribed. She is well within the post operative period, tramadol and methocarbamo l. jen

## 2016-10-05 ENCOUNTER — Ambulatory Visit (INDEPENDENT_AMBULATORY_CARE_PROVIDER_SITE_OTHER): Payer: PRIVATE HEALTH INSURANCE | Admitting: Specialist

## 2016-10-05 ENCOUNTER — Encounter (INDEPENDENT_AMBULATORY_CARE_PROVIDER_SITE_OTHER): Payer: Self-pay | Admitting: Specialist

## 2016-10-05 VITALS — BP 133/81 | HR 94 | Ht 67.0 in | Wt 188.0 lb

## 2016-10-05 DIAGNOSIS — M5136 Other intervertebral disc degeneration, lumbar region: Secondary | ICD-10-CM

## 2016-10-05 DIAGNOSIS — M7542 Impingement syndrome of left shoulder: Secondary | ICD-10-CM | POA: Diagnosis not present

## 2016-10-05 DIAGNOSIS — M47816 Spondylosis without myelopathy or radiculopathy, lumbar region: Secondary | ICD-10-CM | POA: Diagnosis not present

## 2016-10-05 DIAGNOSIS — M7062 Trochanteric bursitis, left hip: Secondary | ICD-10-CM | POA: Diagnosis not present

## 2016-10-05 MED ORDER — HYDROCODONE-ACETAMINOPHEN 5-325 MG PO TABS: 1.0000 | ORAL_TABLET | Freq: Four times a day (QID) | ORAL | 0 refills | Status: DC | PRN

## 2016-10-05 MED ORDER — NAPROXEN 500 MG PO TABS: 500.0000 mg | ORAL_TABLET | Freq: Two times a day (BID) | ORAL | 3 refills | Status: DC

## 2016-10-05 NOTE — Progress Notes (Addendum)
Office Visit Note   Patient: Brandy [name]           Date of Birth: February 22, 1966           MRN: [id number] Visit Date: 10/05/2016              Requested by: [facility], [place] [address] PCP: [place]   Assessment & Plan: Visit Diagnoses:  1. Degenerative disc disease, lumbar   2. Spondylosis of lumbar spine   3. Impingement syndrome of left shoulder   4. Trochanteric bursitis, left hip   51 year old female with lumbar spinal stenosis, now 5 months following decompressive laminectomies bilat at L4-5 and left L5-S1 for subarticular spinal stenosis. Reportedly did well until 2 weeks ago when following walking had increasing symptoms of left buttock and left leg posterior pain, pain pattern is both mechanical discogenic and also with sciatica implying recurring symptoms into her left leg. No significant therapy, clinically no focal neurodeficit or neurotension sign. Will try PT, Also complaints of persistent left shoulder pain,difficulty raising and lying on the left shoulder, had intraop injection with good relief 05/2016. MRI with bursitis and Type II acromion process. Dr. Marlou Sa injected again with no improvement. Clinically impingement with bursitis and tendonitis no RCT, will hold on third injection and go with PT for ROM, stretching and strengthening, iontophoresis. Cinically left hip trochanteric bursitis will also recommend PT with ober stretching exercises of the left leg.  Plan: Avoid frequent bending and stooping  No lifting greater than 10 lbs. May use ice or moist heat for pain. Weight loss is of benefit. Handicap license is approved. Start naprosyn Therapy for the left shoulder and left hip and lumbar spine.  Follow-Up Instructions: Return in about 4 weeks (around 11/02/2016).   Orders:  Orders Placed This Encounter  Procedures  . Ambulatory referral to Physical Therapy   Meds ordered this encounter    Medications  . HYDROcodone-acetaminophen (NORCO/VICODIN) 5-325 MG tablet    Sig: Take 1 tablet by mouth every 6 (six) hours as needed for moderate pain.    Dispense:  50 tablet    Refill:  0      Procedures: No procedures performed   Clinical Data: No additional findings.   Subjective: Chief Complaint  Patient presents with  . Lower Back - Follow-up    51 year old female with lumbar laminectomies in 05/2016 for subarticular lateral recess stenosis bilat L4-5 and Left L5-S1. She notes intitial improvement in pain pattern post op but Over the last month there has been a worsening of pain in the back and into the lower extremities, left leg greater than the right. She has been having sciatica for up to 18 years, awakening at night sometimes. Feels like the old chronic pain is recurring with a vengenance. [facility] for primary care concerns Dr. Jerilee Field. Has been taking ultracet and arthritis strength tylenol for pain. Bowel and bladder function is normal. Taking the ultracet as often as 4-6 times per day. Weight is gaining, she is putting on more weight. Walking is beginning to be associated with pain. She walked a mile with her son a couple weeks ago and afterwards the pain came on, the next day with pain also into the left leg, like pain she had previous to her surgery. Bending and stooping hurting, pain with prolong sitting. Trouble with lifting, able to do 6 lb weights but is  limited with lifting with the left shoulder.     Review of Systems  Constitutional: Negative.   HENT: Negative.   Eyes: Negative.   Respiratory: Negative.   Cardiovascular: Negative.   Gastrointestinal: Negative.   Endocrine: Negative.   Genitourinary: Negative.   Musculoskeletal: Negative.   Skin: Negative.   Allergic/Immunologic: Negative.   Neurological: Negative.   Hematological: Negative.   Psychiatric/Behavioral: Negative.      Objective: Vital Signs: BP 133/81  (BP Location: Right Arm, Patient Position: Sitting)   Pulse 94   Ht 5\' 7"  (1.702 m)   Wt 188 lb (85.3 kg)   BMI 29.44 kg/m   Physical Exam  Constitutional: She is oriented to person, place, and time. She appears well-developed and well-nourished.  HENT:  Head: Normocephalic and atraumatic.  Eyes: EOM are normal. Pupils are equal, round, and reactive to light.  Neck: Normal range of motion. Neck supple.  Pulmonary/Chest: Effort normal and breath sounds normal.  Abdominal: Soft. Bowel sounds are normal.  Neurological: She is alert and oriented to person, place, and time.  Skin: Skin is warm and dry.  Psychiatric: She has a normal mood and affect. Her behavior is normal. Judgment and thought content normal.    Left Hip Exam   Tenderness  The patient is experiencing tenderness in the greater trochanter.  Range of Motion  Extension: normal  Flexion: normal  Internal Rotation: normal  External Rotation: normal  Abduction: abnormal  Adduction: abnormal   Muscle Strength  Abduction: 4/5  Adduction: 5/5  Flexion: 5/5   Tests  Ober: positive  Other  Sensation: normal Pulse: present   Back Exam   Tenderness  The patient is experiencing tenderness in the lumbar.  Range of Motion  Extension: abnormal  Flexion: abnormal  Lateral Bend Right: abnormal  Lateral Bend Left: abnormal  Rotation Right: abnormal   Muscle Strength  Right Quadriceps:  5/5  Left Quadriceps:  5/5  Right Hamstrings:  5/5  Left Hamstrings:  5/5   Tests  Straight leg raise right: negative Straight leg raise left: negative  Reflexes  Patellar: 1/4 Achilles: 1/4 Babinski's sign: normal   Other  Toe Walk: normal Heel Walk: normal Sensation: decreased Gait: normal  Erythema: no back redness Scars: absent   Left Shoulder Exam  Left shoulder exam is normal.  Tenderness  The patient is experiencing tenderness in the acromion.  Muscle Strength  Abduction: 4/5  Internal Rotation:  4/5  External Rotation: 4/5  Supraspinatus: 4/5  Subscapularis: 4/5  Biceps: 4/5   Tests  Apprehension: positive Impingement: positive  Other  Erythema: absent Scars: absent Sensation: normal Pulse: present       Specialty Comments:  No specialty comments available.  Imaging: No results found.   PMFS History: Patient Active Problem List   Diagnosis Date Noted  . Spinal stenosis, 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    Priority: High    Class: Chronic  . Left shoulder tendonitis 05/23/2016    Priority: High    Class: Acute  . Post-op pain 05/23/2016   Past Medical History:  Diagnosis Date  . Arthritis   . ASD (atrial septal defect)    s/p closure with Amplatzer device 10/05/04 (Dr. Myriam Jacobson, [facility]) 10/05/04  . GERD (gastroesophageal reflux disease)   . Headache   . Heart murmur    no longer heard  . Legally blind in right eye, as defined in [place]     Family History  Problem Relation Age of  Onset  . Diabetes Mother   . High blood pressure Mother   . Cancer Father     Past Surgical History:  Procedure Laterality Date  . ABLATION    . BREAST SURGERY    . BUNIONECTOMY    . CARDIAC CATHETERIZATION     10/05/04 [facility]): LM < 25%, otherwise normal coronaries. No pulmonary HTN, Mildly enlarged RV. Secundum ASD s/p closure.  Marland Kitchen CARDIAC SURGERY    . CATARACT EXTRACTION    . CLEFT PALATE REPAIR     s/p cleft lip and palate repair  . LUMBAR LAMINECTOMY/DECOMPRESSION MICRODISCECTOMY Left 05/23/2016   Procedure: LEFT L4-L5 LATERAL RECESS DECOMPRESSION WITH CENTRAL AND RIGHT DECOMPRESSION VIA LEFT SIDE;  Surgeon: [name], MD;  Location: [place];  Service: Orthopedics;  Laterality: Left;  . LUMBAR LAMINECTOMY/DECOMPRESSION MICRODISCECTOMY Left 05/23/2016   Procedure: LUMBAR LAMINECTOMY/DECOMPRESSION MICRODISCECTOMY Lumbar five - Sacral One 1 LEVEL;  Surgeon: [name], MD;  Location: [place];  Service: Orthopedics;  Laterality: Left;  .  SHOULDER INJECTION Left 05/23/2016   Procedure: SHOULDER INJECTION;  Surgeon: [name], MD;  Location: [place];  Service: Orthopedics;  Laterality: Left;  band-aid per pa-c  . TRANSTHORACIC ECHOCARDIOGRAM     12/15/05 [facility]): Mild LVH, EF > 42%, grade 1 diastolic dysfunction, Trivial MR/PR/TR.   Social History   Occupational History  . Not on file.   Social History Main Topics  . Smoking status: Current Every Day Smoker    Packs/day: 0.50    Types: Cigarettes  . Smokeless tobacco: Never Used  . Alcohol use No  . Drug use: No  . Sexual activity: Not on file

## 2016-10-05 NOTE — Patient Instructions (Addendum)
Avoid frequent bending and stooping  No lifting greater than 10 lbs. May use ice or moist heat for pain. Weight loss is of benefit. Handicap license is approved. Start naprosyn Therapy for the left shoulder and left hip and lumbar spine.

## 2016-11-14 ENCOUNTER — Telehealth (INDEPENDENT_AMBULATORY_CARE_PROVIDER_SITE_OTHER): Payer: Self-pay | Admitting: Specialist

## 2016-11-14 ENCOUNTER — Other Ambulatory Visit (INDEPENDENT_AMBULATORY_CARE_PROVIDER_SITE_OTHER): Payer: Self-pay | Admitting: Radiology

## 2016-11-14 DIAGNOSIS — M7542 Impingement syndrome of left shoulder: Secondary | ICD-10-CM

## 2016-11-14 DIAGNOSIS — M47816 Spondylosis without myelopathy or radiculopathy, lumbar region: Secondary | ICD-10-CM

## 2016-11-14 DIAGNOSIS — M7062 Trochanteric bursitis, left hip: Secondary | ICD-10-CM

## 2016-11-14 MED ORDER — HYDROCODONE-ACETAMINOPHEN 5-325 MG PO TABS: 1.0000 | ORAL_TABLET | Freq: Four times a day (QID) | ORAL | 0 refills | Status: DC | PRN

## 2016-11-14 NOTE — Telephone Encounter (Signed)
Patient called asked if she can get a referral sent back over for (PT) and also need something else prescribed for pain. The Naproxen is making her stomach upset causing acid reflux. Patient said she is almost out of vicodin. The number to contact patient is  [phone]

## 2016-11-14 NOTE — Telephone Encounter (Signed)
Patient called asked if she can get a referral sent back over for (PT) and also need something else prescribed for pain. The Naproxen is making her stomach upset causing acid reflux. Patient said she is almost out of vicodin.

## 2016-11-15 NOTE — Telephone Encounter (Signed)
Patient is aware that her rx is ready for pick up at the front desk 

## 2016-11-16 ENCOUNTER — Telehealth (INDEPENDENT_AMBULATORY_CARE_PROVIDER_SITE_OTHER): Payer: Self-pay | Admitting: Radiology

## 2016-11-16 NOTE — Telephone Encounter (Signed)
Patient called and LM triage asking if we have gotten the precert for her medication.  The pharmacy was supposed to fax [place] something. Pls call pt to advise.

## 2016-11-23 NOTE — Telephone Encounter (Signed)
Rx given on 6/11 jen

## 2016-12-29 ENCOUNTER — Ambulatory Visit (INDEPENDENT_AMBULATORY_CARE_PROVIDER_SITE_OTHER): Payer: PRIVATE HEALTH INSURANCE | Admitting: Specialist

## 2016-12-29 ENCOUNTER — Encounter (INDEPENDENT_AMBULATORY_CARE_PROVIDER_SITE_OTHER): Payer: Self-pay | Admitting: Specialist

## 2016-12-29 ENCOUNTER — Ambulatory Visit (INDEPENDENT_AMBULATORY_CARE_PROVIDER_SITE_OTHER): Payer: Self-pay

## 2016-12-29 VITALS — BP 135/87 | HR 88 | Wt 203.0 lb

## 2016-12-29 DIAGNOSIS — M5416 Radiculopathy, lumbar region: Secondary | ICD-10-CM

## 2016-12-29 DIAGNOSIS — M2550 Pain in unspecified joint: Secondary | ICD-10-CM | POA: Diagnosis not present

## 2016-12-29 LAB — CBC WITH DIFFERENTIAL/PLATELET
BASOS ABS: 0 {cells}/uL (ref 0–200)
Basophils Relative: 0 %
Eosinophils Absolute: 144 cells/uL (ref 15–500)
Eosinophils Relative: 2 %
HCT: 40.6 % (ref 35.0–45.0)
Hemoglobin: 13.2 g/dL (ref 11.7–15.5)
LYMPHS PCT: 58 %
Lymphs Abs: 4176 cells/uL — ABNORMAL HIGH (ref 850–3900)
MCH: 31.3 pg (ref 27.0–33.0)
MCHC: 32.5 g/dL (ref 32.0–36.0)
MCV: 96.2 fL (ref 80.0–100.0)
MPV: 9.5 fL (ref 7.5–12.5)
Monocytes Absolute: 288 cells/uL (ref 200–950)
Monocytes Relative: 4 %
NEUTROS PCT: 36 %
Neutro Abs: 2592 cells/uL (ref 1500–7800)
Platelets: 348 10*3/uL (ref 140–400)
RBC: 4.22 MIL/uL (ref 3.80–5.10)
RDW: 14 % (ref 11.0–15.0)
WBC: 7.2 10*3/uL (ref 3.8–10.8)

## 2016-12-30 LAB — SEDIMENTATION RATE: Sed Rate: 6 mm/hr (ref 0–30)

## 2016-12-30 LAB — URIC ACID: Uric Acid, Serum: 6.5 mg/dL (ref 2.5–7.0)

## 2016-12-30 LAB — ANA: Anti Nuclear Antibody(ANA): NEGATIVE

## 2016-12-30 LAB — RHEUMATOID FACTOR

## 2017-01-03 ENCOUNTER — Telehealth (INDEPENDENT_AMBULATORY_CARE_PROVIDER_SITE_OTHER): Payer: Self-pay | Admitting: Specialist

## 2017-01-03 NOTE — Telephone Encounter (Signed)
Patient called advised she is having her MRI 01/13/17. Patient said she will need an appointment for her MRI review after that.  Patient also advised she is having a lot of pressure in her lower back. Patient said it feels like there is compression in her back. The number to contact patient is [phone]

## 2017-01-04 NOTE — Telephone Encounter (Signed)
I put patient on a call list to sched appt if something opens up sooner

## 2017-01-12 ENCOUNTER — Ambulatory Visit (INDEPENDENT_AMBULATORY_CARE_PROVIDER_SITE_OTHER): Payer: PRIVATE HEALTH INSURANCE | Admitting: Specialist

## 2017-01-13 ENCOUNTER — Ambulatory Visit
Admission: RE | Admit: 2017-01-13 | Discharge: 2017-01-13 | Disposition: A | Discharge status: Home / Self Care | Payer: 59 | Source: Ambulatory Visit | Attending: Surgery | Admitting: Surgery

## 2017-01-13 DIAGNOSIS — M5416 Radiculopathy, lumbar region: Secondary | ICD-10-CM

## 2017-01-13 MED ORDER — GADOBENATE DIMEGLUMINE 529 MG/ML IV SOLN
20.0000 mL | Freq: Once | INTRAVENOUS | Status: AC | PRN
  Administered 2017-01-13: 17 mL via INTRAVENOUS

## 2017-01-17 ENCOUNTER — Ambulatory Visit (INDEPENDENT_AMBULATORY_CARE_PROVIDER_SITE_OTHER): Payer: PRIVATE HEALTH INSURANCE | Admitting: Surgery

## 2017-01-17 ENCOUNTER — Encounter (INDEPENDENT_AMBULATORY_CARE_PROVIDER_SITE_OTHER): Payer: Self-pay | Admitting: Surgery

## 2017-01-17 DIAGNOSIS — M5136 Other intervertebral disc degeneration, lumbar region: Secondary | ICD-10-CM

## 2017-01-17 MED ORDER — HYDROCODONE-ACETAMINOPHEN 5-325 MG PO TABS: 1.0000 | ORAL_TABLET | Freq: Two times a day (BID) | ORAL | 0 refills | Status: DC | PRN

## 2017-01-31 ENCOUNTER — Ambulatory Visit (INDEPENDENT_AMBULATORY_CARE_PROVIDER_SITE_OTHER): Payer: PRIVATE HEALTH INSURANCE | Admitting: Specialist

## 2017-01-31 ENCOUNTER — Encounter (INDEPENDENT_AMBULATORY_CARE_PROVIDER_SITE_OTHER): Payer: Self-pay | Admitting: Specialist

## 2017-01-31 VITALS — BP 133/77 | HR 88 | Ht 67.0 in | Wt 203.0 lb

## 2017-01-31 DIAGNOSIS — M961 Postlaminectomy syndrome, not elsewhere classified: Secondary | ICD-10-CM

## 2017-01-31 NOTE — Patient Instructions (Signed)
Avoid frequent bending and stooping  No lifting greater than 10 lbs. May use ice or moist heat for pain. Weight loss is of benefit. Handicap license is approved. I am referring you to Dr. Ernestina Patches for consideration of a indwelling spinal cord stimulator. Need to avoid use of narcotics as much as posssible. I think the only further surgical solution for your spine would be 4 level lumbar fusion with likelihood that you would be  Permanently unable to return to work.

## 2017-01-31 NOTE — Progress Notes (Signed)
Office Visit Note   Patient: Brandy [name]           Date of Birth: 03/02/1966           MRN: [id number] Visit Date: 01/31/2017              Requested by: [facility] [address] [place], [place] [address] PCP: [facility]   Assessment & Plan: Visit Diagnoses:  1. Lumbar post-laminectomy syndrome     Plan: Avoid frequent bending and stooping  No lifting greater than 10 lbs. May use ice or moist heat for pain. Weight loss is of benefit. Handicap license is approved. I am referring you to Dr. Ernestina Patches for consideration of a indwelling spinal cord stimulator. Need to avoid use of narcotics as much as posssible. I think the only further surgical solution for your spine would be 4 level lumbar fusion with likelihood that you would be  Permanently unable to return to work.  Follow-Up Instructions: Return in about 4 weeks (around 02/28/2017).   Orders:  No orders of the defined types were placed in this encounter.  No orders of the defined types were placed in this encounter.     Procedures: No procedures performed   Clinical Data: No additional findings.   Subjective: Chief Complaint  Patient presents with  . Lower Back - Follow-up    HPI  Review of Systems   Objective: Vital Signs: BP 133/77 (BP Location: Left Arm, Patient Position: Sitting)   Pulse 88   Ht 5\' 7"  (1.702 m)   Wt 203 lb (92.1 kg)   BMI 31.79 kg/m   Physical Exam  Ortho Exam  Specialty Comments:  No specialty comments available.  Imaging: No results found.   PMFS History: Patient Active Problem List   Diagnosis Date Noted  . Spinal stenosis, 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    Priority: High    Class: Chronic  . Left shoulder tendonitis 05/23/2016    Priority: High    Class: Acute  . Post-op pain 05/23/2016   Past Medical History:  Diagnosis Date  . Arthritis   . ASD (atrial septal defect)    s/p closure  with Amplatzer device 10/05/04 (Dr. Myriam Jacobson, [facility]) 10/05/04  . GERD (gastroesophageal reflux disease)   . Headache   . Heart murmur    no longer heard  . Legally blind in right eye, as defined in [place]     Family History  Problem Relation Age of Onset  . Diabetes Mother   . High blood pressure Mother   . Cancer Father     Past Surgical History:  Procedure Laterality Date  . ABLATION    . BREAST SURGERY    . BUNIONECTOMY    . CARDIAC CATHETERIZATION     10/05/04 [facility]): LM < 25%, otherwise normal coronaries. No pulmonary HTN, Mildly enlarged RV. Secundum ASD s/p closure.  Marland Kitchen CARDIAC SURGERY    . CATARACT EXTRACTION    . CLEFT PALATE REPAIR     s/p cleft lip and palate repair  . LUMBAR LAMINECTOMY/DECOMPRESSION MICRODISCECTOMY Left 05/23/2016   Procedure: LEFT L4-L5 LATERAL RECESS DECOMPRESSION WITH CENTRAL AND RIGHT DECOMPRESSION VIA LEFT SIDE;  Surgeon: [name], MD;  Location: [place];  Service: Orthopedics;  Laterality: Left;  . LUMBAR LAMINECTOMY/DECOMPRESSION MICRODISCECTOMY Left 05/23/2016   Procedure: LUMBAR LAMINECTOMY/DECOMPRESSION MICRODISCECTOMY Lumbar five - Sacral One 1 LEVEL;  Surgeon: [name], MD;  Location: [place];  Service: Orthopedics;  Laterality: Left;  . SHOULDER INJECTION Left 05/23/2016   Procedure: SHOULDER INJECTION;  Surgeon: [name], MD;  Location: [place];  Service: Orthopedics;  Laterality: Left;  band-aid per pa-c  . TRANSTHORACIC ECHOCARDIOGRAM     12/15/05 [facility]): Mild LVH, EF > 11%, grade 1 diastolic dysfunction, Trivial MR/PR/TR.   Social History   Occupational History  . Not on file.   Social History Main Topics  . Smoking status: Current Every Day Smoker    Packs/day: 0.50    Types: Cigarettes  . Smokeless tobacco: Never Used  . Alcohol use No  . Drug use: No  . Sexual activity: Not on file

## 2017-01-31 NOTE — Progress Notes (Signed)
Office Visit Note   Patient: Brandy [name]           Date of Birth: [birth year]           MRN: [id number] Visit Date: 12/29/2016              Requested by: [facility] [address] [place], [place] [address] PCP: [facility]   Assessment & Plan: Visit Diagnoses:  1. Radiculopathy, lumbar region   2. Polyarthralgia     Plan: With patient's ongoing lumbar spine issues we will repeat MRI to see if there are any progressive changes. With question of polyarthralgia we'll also get a CBC and arthritis panel. Follow-up in the office after completion to discuss results.  Follow-Up Instructions: Return in about 2 weeks (around 01/12/2017) for Review lumbar MRI and labs.   Orders:  Orders Placed This Encounter  Procedures  . MR Lumbar Spine W Wo Contrast  . CBC with Differential  . Antinuclear Antib (ANA)  . Rheumatoid Factor  . Uric acid  . Sed Rate (ESR)   No orders of the defined types were placed in this encounter.     Procedures: No procedures performed   Clinical Data: No additional findings.   Subjective: Chief Complaint  Patient presents with  . Lower Back - Pain  . Left Shoulder - Pain    HPI Patient seems to complain of low back and bilateral leg pain.  Patient is status post left L4-5 lateral recess decompression with central and right decompression via left side. Left lumbar laminectomy/decompression microdiscectomy L5-S1. At the time of her surgery patient also had left shoulder injected. Continues to have pain in the left shoulder. Has also had some soreness in her knees at times.     . Review of Systems No cardiac pulmonary GI GU issues.  Objective: Vital Signs: BP 135/87   Pulse 88   Wt 203 lb (92.1 kg)   BMI 31.79 kg/m   Physical Exam  Constitutional: She is oriented to person, place, and time. She appears well-developed. No distress.  Neck: Normal range of motion.  Pulmonary/Chest: No respiratory  distress.  Musculoskeletal:  Exam left shoulder she does have some pain with impingement testing. Negative drop arm. Negative logroll bilateral hips. Bilateral knees are mildly tender. Lumbar paraspinal tenderness. Calves nontender.  Neurological: She is alert and oriented to person, place, and time.  Skin: Skin is warm and dry.  Psychiatric: She has a normal mood and affect.    Ortho Exam  Specialty Comments:  No specialty comments available.  Imaging: No results found.   PMFS History: Patient Active Problem List   Diagnosis Date Noted  . 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 Left shoulder tendonitis 05/23/2016    Class: Acute  . Post-op pain 05/23/2016   Past Medical History:  Diagnosis Date  . Arthritis   . ASD (atrial septal defect)    s/p closure with Amplatzer device 10/05/04 (Dr. Myriam Jacobson, [facility]) 10/05/04  . GERD (gastroesophageal reflux disease)   . Headache   . Heart murmur    no longer heard  . Legally blind in right eye, as defined in [place]     Family History  Problem Relation Age of Onset  . Diabetes Mother   . High blood pressure Mother   . Cancer Father     Past Surgical History:  Procedure Laterality Date  . ABLATION    .  BREAST SURGERY    . BUNIONECTOMY    . CARDIAC CATHETERIZATION     10/05/04 [facility]): LM < 25%, otherwise normal coronaries. No pulmonary HTN, Mildly enlarged RV. Secundum ASD s/p closure.  Marland Kitchen CARDIAC SURGERY    . CATARACT EXTRACTION    . CLEFT PALATE REPAIR     s/p cleft lip and palate repair  . LUMBAR LAMINECTOMY/DECOMPRESSION MICRODISCECTOMY Left 05/23/2016   Procedure: LEFT L4-L5 LATERAL RECESS DECOMPRESSION WITH CENTRAL AND RIGHT DECOMPRESSION VIA LEFT SIDE;  Surgeon: [name], MD;  Location: [place];  Service: Orthopedics;  Laterality: Left;  . LUMBAR LAMINECTOMY/DECOMPRESSION MICRODISCECTOMY Left 05/23/2016   Procedure: LUMBAR LAMINECTOMY/DECOMPRESSION  MICRODISCECTOMY Lumbar five - Sacral One 1 LEVEL;  Surgeon: [name], MD;  Location: [place];  Service: Orthopedics;  Laterality: Left;  . SHOULDER INJECTION Left 05/23/2016   Procedure: SHOULDER INJECTION;  Surgeon: [name], MD;  Location: [place];  Service: Orthopedics;  Laterality: Left;  band-aid per pa-c  . TRANSTHORACIC ECHOCARDIOGRAM     12/15/05 [facility]): Mild LVH, EF > 60%, grade 1 diastolic dysfunction, Trivial MR/PR/TR.   Social History   Occupational History  . Not on file.   Social History Main Topics  . Smoking status: Current Every Day Smoker    Packs/day: 0.50    Types: Cigarettes  . Smokeless tobacco: Never Used  . Alcohol use No  . Drug use: No  . Sexual activity: Not on file

## 2017-01-31 NOTE — Progress Notes (Signed)
Office Visit Note   Patient: Brandy [name]           Date of Birth: 07-08-1965           MRN: [id number] Visit Date: 01/17/2017              Requested by: [facility] [address] [place],  [id number] PCP: [facility]   Assessment & Plan: Visit Diagnoses:  1. Degenerative disc disease, lumbar     Plan: Today I did review lumbar MRI results with patient. Recommend follow-up with Dr. Louanne Skye in a couple of weeks for him also to review the study and to discuss further treatment options. Patient voices understanding. All questions answered.  Follow-Up Instructions: Return in about 2 weeks (around 01/31/2017) for Dr Louanne Skye 830 appointment.   Orders:  No orders of the defined types were placed in this encounter.  Meds ordered this encounter  Medications  . HYDROcodone-acetaminophen (NORCO/VICODIN) 5-325 MG tablet    Sig: Take 1 tablet by mouth every 12 (twelve) hours as needed for moderate pain.    Dispense:  50 tablet    Refill:  0      Procedures: No procedures performed   Clinical Data: No additional findings.   Subjective: Chief Complaint  Patient presents with  . Lower Back - Pain    HPI Patient returns for review of lumbar spine MRI that was performed 01/13/2017. Report read status post left L4-5 and bilateral L5-S1 laminectomies. Grade 1 L2-3 anterolisthesis, grade 1 L4-5 retrolisthesis, grade 1 L5 retrolisthesis. No canal stenosis. Mild left L4-5 and L5-S1 neural foraminal narrowing. L4-5 small left subarticular disc protrusion and annular fissure persists. L3-4 small broad-based right extraforaminal disc protrusion unchanged. Moderate to severe facet arthropathy without canal stenosis or neural foraminal narrowing. L2-3 unroofing of the disc without disc bulge. 4 mm right facet synovial cyst decreased from 9 mm, decreased fluid component. Moderate to severe right, moderate left facet arthropathy and ligamentum flavum  redundancy without canal stenosis or neural foraminal narrowing. States that her lumbar symptoms are unchanged. She is a bilateral leg pain that burns down into her thighs and calves. Patient is currently out of work. Review of Systems No current cardiac pulmonary GI GU issues.  Objective: Vital Signs: There were no vitals taken for this visit.  Physical Exam  Constitutional: She is oriented to person, place, and time. No distress.  HENT:  Head: Normocephalic and atraumatic.  Neck: Normal range of motion.  Pulmonary/Chest: No respiratory distress.  Musculoskeletal:  Gait is somewhat antalgic. Pain with lumbar flexion and extension. Lumbar paraspinal tenderness. She has trace bilateral hip flexor weakness with resistance. Positive Left greater than right straight leg raise test. Negative logroll bilateral hips. Bilateral calves are nontender.  Neurological: She is alert and oriented to person, place, and time.  Skin: Skin is warm and dry.  Psychiatric: She has a normal mood and affect.    Ortho Exam  Specialty Comments:  No specialty comments available.  Imaging: No results found.   PMFS History: Patient Active Problem List   Diagnosis Date Noted  . 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 Left shoulder tendonitis 05/23/2016    Class: Acute  . Post-op pain 05/23/2016   Past Medical History:  Diagnosis Date  . Arthritis   . ASD (atrial septal defect)    s/p closure with Amplatzer device 10/05/04 (Dr. Myriam Jacobson, [facility]) 10/05/04  .  GERD (gastroesophageal reflux disease)   . Headache   . Heart murmur    no longer heard  . Legally blind in right eye, as defined in [place]     Family History  Problem Relation Age of Onset  . Diabetes Mother   . High blood pressure Mother   . Cancer Father     Past Surgical History:  Procedure Laterality Date  . ABLATION    . BREAST SURGERY    . BUNIONECTOMY    . CARDIAC  CATHETERIZATION     10/05/04 [facility]): LM < 25%, otherwise normal coronaries. No pulmonary HTN, Mildly enlarged RV. Secundum ASD s/p closure.  Marland Kitchen CARDIAC SURGERY    . CATARACT EXTRACTION    . CLEFT PALATE REPAIR     s/p cleft lip and palate repair  . LUMBAR LAMINECTOMY/DECOMPRESSION MICRODISCECTOMY Left 05/23/2016   Procedure: LEFT L4-L5 LATERAL RECESS DECOMPRESSION WITH CENTRAL AND RIGHT DECOMPRESSION VIA LEFT SIDE;  Surgeon: [name], MD;  Location: [place];  Service: Orthopedics;  Laterality: Left;  . LUMBAR LAMINECTOMY/DECOMPRESSION MICRODISCECTOMY Left 05/23/2016   Procedure: LUMBAR LAMINECTOMY/DECOMPRESSION MICRODISCECTOMY Lumbar five - Sacral One 1 LEVEL;  Surgeon: [name], MD;  Location: [place];  Service: Orthopedics;  Laterality: Left;  . SHOULDER INJECTION Left 05/23/2016   Procedure: SHOULDER INJECTION;  Surgeon: [name], MD;  Location: [place];  Service: Orthopedics;  Laterality: Left;  band-aid per pa-c  . TRANSTHORACIC ECHOCARDIOGRAM     12/15/05 [facility]): Mild LVH, EF > 62%, grade 1 diastolic dysfunction, Trivial MR/PR/TR.   Social History   Occupational History  . Not on file.   Social History Main Topics  . Smoking status: Current Every Day Smoker    Packs/day: 0.50    Types: Cigarettes  . Smokeless tobacco: Never Used  . Alcohol use No  . Drug use: No  . Sexual activity: Not on file

## 2017-02-01 ENCOUNTER — Ambulatory Visit (INDEPENDENT_AMBULATORY_CARE_PROVIDER_SITE_OTHER): Payer: PRIVATE HEALTH INSURANCE | Admitting: Specialist

## 2017-02-16 ENCOUNTER — Ambulatory Visit (INDEPENDENT_AMBULATORY_CARE_PROVIDER_SITE_OTHER): Payer: PRIVATE HEALTH INSURANCE | Admitting: Physical Medicine and Rehabilitation

## 2017-02-16 ENCOUNTER — Encounter (INDEPENDENT_AMBULATORY_CARE_PROVIDER_SITE_OTHER): Payer: Self-pay | Admitting: Physical Medicine and Rehabilitation

## 2017-02-16 VITALS — BP 134/77 | HR 92

## 2017-02-16 DIAGNOSIS — G894 Chronic pain syndrome: Secondary | ICD-10-CM

## 2017-02-16 DIAGNOSIS — M5416 Radiculopathy, lumbar region: Secondary | ICD-10-CM | POA: Diagnosis not present

## 2017-02-16 DIAGNOSIS — M961 Postlaminectomy syndrome, not elsewhere classified: Secondary | ICD-10-CM

## 2017-02-16 NOTE — Progress Notes (Signed)
Lauree Yurick - 51 y.o. female MRN [id number]  Date of birth: October 01, 1965  Office Visit Note: Visit Date: 02/16/2017 PCP: [facility] Referred by: [facility]  Subjective: Chief Complaint  Patient presents with  . Lower Back - Pain  . Left Leg - Pain  . Right Leg - Pain   HPI: Mrs. Brandy [name] is a pleasant 51 year old female with chronic worsening severe recalcitrant lower back pain with bilateral radicular leg pain and burning pain. She comes in today at the request of Dr. Louanne Skye for evaluation for possible spinal cord stimulator trial and ultimate placement. I have actually seen her on one or 2 occasions for epidural injections which were not very successful. She went on to have laminectomy decompression at L4-5 and L5-S1. This was completed in December 2017. She reports that for 2 weeks she had really good relief of symptoms in his symptoms returned after that. She has had physical therapy and medication management including opioids. She is currently taking muscle relaxers as well as hydrocodone. She has had injections. She does have new MRI which is reviewed below. This shows degenerative facet arthropathy above the fusion but without specific nerve compression. Dr. Louanne Skye feels any attempt at surgery would be a very large fusion surgery at multiple levels. She actually has some understanding of spinal cord stimulators. She reports that her husband actually did have a trial and implantation but it didn't seem to work out too well for him as he had multiple problems with how to control the device and issues with that. She is unsure which company of stimulator he had.    Review of Systems  Constitutional: Negative for chills, fever, malaise/fatigue and weight loss.  HENT: Negative for hearing loss and sinus pain.   Eyes: Negative for blurred vision, double vision and photophobia.  Respiratory: Negative for cough and shortness of breath.   Cardiovascular:  Negative for chest pain, palpitations and leg swelling.  Gastrointestinal: Negative for abdominal pain, nausea and vomiting.  Genitourinary: Negative for flank pain.  Musculoskeletal: Positive for back pain and joint pain. Negative for myalgias.       Bilateral leg pain  Skin: Negative for itching and rash.  Neurological: Negative for tremors, focal weakness and weakness.  Endo/Heme/Allergies: Negative.   Psychiatric/Behavioral: Negative for depression.  All other systems reviewed and are negative.  Otherwise per HPI.  Assessment & Plan: Visit Diagnoses:  1. Post laminectomy syndrome   2. Lumbar radiculopathy   3. Chronic pain syndrome     Plan: Findings:  Chronic worsening severe recalcitrant low back pain with bilateral radicular leg pain and mostly in L5 and S1 distribution. This is despite L4-5 and L5-S1 laminectomy decompression. Fairly new MRI was reviewed with the patient and she is been followed by Dr. Louanne Skye. She has failed care with opioid management as well as therapy and time. I do think she would be a good candidate for spinal cord stimulator trial. We'll go ahead and get psychological evaluation started with Dr. Ilean Skill. After that we would seek approval for stimulator trial. This does represent essentially last resort procedure for the patient verses longstem multilevel fusion.    Meds & Orders: No orders of the defined types were placed in this encounter.   Orders Placed This Encounter  Procedures  . Ambulatory referral to Neuropsychology    Follow-up: Return if symptoms worsen or fail to improve.   Procedures: No procedures performed  No notes on file   Clinical  History: MRI LUMBAR SPINE WITHOUT AND WITH CONTRAST  TECHNIQUE: Multiplanar and multiecho pulse sequences of the lumbar spine were obtained without and with intravenous contrast.  CONTRAST:  43mL MULTIHANCE GADOBENATE DIMEGLUMINE 529 MG/ML IV SOLN  COMPARISON:  MRI of the lumbar spine  March 06, 2016 and lumbar spine radiographs August 25, 2016  FINDINGS: SEGMENTATION: For the purposes of this report, the last well-formed intervertebral disc will be reported as L5-S1.  ALIGNMENT: Straightened lumbar lordosis. Minimal grade 1 L2-3 anterolisthesis. Minimal grade 1 L4-5 retrolisthesis. Grade 1 L5-S1 retrolisthesis.  VERTEBRAE:Vertebral bodies are intact. Moderate to severe L5-S1 disc height loss, progressed from prior MRI. Mild desiccation lower lumbar discs. Mild chronic discogenic endplate changes X4-J2. Similar low T1, bright STIR signal single segment upper coccyx, without definite enhancement though, not tailored for evaluation. No abnormal or acute lumbar spine bone marrow signal or enhancement. No abnormal disc enhancement.  CONUS MEDULLARIS: Conus medullaris terminates at L1-2 and demonstrates normal morphology and signal characteristics. Cauda equina is normal. No abnormal cord, leptomeningeal or epidural enhancement.  PARASPINAL AND SOFT TISSUES: Included prevertebral and paraspinal soft tissues are nonacute. Moderate LEFT sacral paraspinal denervation/atrophy.  DISC LEVELS:  T12-L1, L1-2: No disc bulge, canal stenosis nor neural foraminal narrowing. Mild to moderate facet arthropathy.  L2-3: Anterolisthesis. Unroofing of the disc without disc bulge. 4 mm RIGHT facet synovial cyst decreased from 9 mm, decreased fluid component. Moderate to severe RIGHT, moderate LEFT facet arthropathy and ligamentum flavum redundancy without canal stenosis or neural foraminal narrowing.  L3-4: Small broad-based RIGHT extraforaminal disc protrusion unchanged. Moderate to severe facet arthropathy without canal stenosis or neural foraminal narrowing.  L4-5: Retrolisthesis. LEFT hemilaminectomy. Homogeneous enhancing granulation tissue within the surgical bed, no fluid collection. Small LEFT subarticular disc protrusion and annular fissure persists. No  canal stenosis. Mild LEFT neural foraminal narrowing.  L5-S1: Bilateral L5-S1 laminectomies. Homogeneous enhancing granulation tissue within the surgical bed, no focal fluid collection. Mild facet arthropathy. No canal stenosis. Mild LEFT neural foraminal narrowing.  IMPRESSION: 1. Status post LEFT L4-5 and bilateral L5-S1 laminectomies. 2. Grade 1 L2-3 anterolisthesis, grade 1 L4-5 retrolisthesis, grade 1 L5-S1 retrolisthesis. 3. No canal stenosis. Mild LEFT L4-5 and L5-S1 neural foraminal narrowing. 4. Similar abnormal upper coccyx bone marrow signal, nonspecific and incompletely characterized.   Electronically Signed   By: Elon Alas M.D.   On: 01/14/2017 00:23  She reports that she has been smoking Cigarettes.  She has been smoking about 0.50 packs per day. She has never used smokeless tobacco.   Recent Labs  12/29/16 1439  LABURIC 6.5    Objective:  VS:  HT:    WT:   BMI:     BP:134/77  HR:92bpm  TEMP: ( )  RESP:  Physical Exam  Constitutional: She is oriented to person, place, and time. She appears well-developed and well-nourished. No distress.  HENT:  Head: Normocephalic and atraumatic.  Nose: Nose normal.  Mouth/Throat: Oropharynx is clear and moist.  Eyes: Pupils are equal, round, and reactive to light. Conjunctivae are normal.  exophthalmos  disconjugate gaze patient is wearing glasses  Neck: Normal range of motion. Neck supple.  Cardiovascular: Regular rhythm and intact distal pulses.   Pulmonary/Chest: Effort normal. No respiratory distress.  Abdominal: She exhibits no distension. There is no guarding.  Musculoskeletal:  Patient ambulates without aid. She has good distal strength bilaterally she is tight hamstrings bilaterally. Equivocal slump test bilaterally. She has no pain with hip rotation. She has good distal strength with intact  reflexes at the gastrocnemius bilaterally someone less reflexes at the patella bilaterally.  Neurological: She  is alert and oriented to person, place, and time. She exhibits normal muscle tone. Coordination normal.  Skin: Skin is warm. No rash noted. No erythema.  Psychiatric: She has a normal mood and affect. Her behavior is normal.  Nursing note and vitals reviewed.   Ortho Exam Imaging: No results found.  Past Medical/Family/Surgical/Social History: Medications & Allergies reviewed per EMR Patient Active Problem List   Diagnosis Date Noted  . 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 Left shoulder tendonitis 05/23/2016    Class: Acute  . Post-op pain 05/23/2016   Past Medical History:  Diagnosis Date  . Arthritis   . ASD (atrial septal defect)    s/p closure with Amplatzer device 10/05/04 (Dr. Myriam Jacobson, [facility]) 10/05/04  . GERD (gastroesophageal reflux disease)   . Headache   . Heart murmur    no longer heard  . Legally blind in right eye, as defined in [place]    Family History  Problem Relation Age of Onset  . Diabetes Mother   . High blood pressure Mother   . Cancer Father    Past Surgical History:  Procedure Laterality Date  . ABLATION    . BREAST SURGERY    . BUNIONECTOMY    . CARDIAC CATHETERIZATION     10/05/04 [facility]): LM < 25%, otherwise normal coronaries. No pulmonary HTN, Mildly enlarged RV. Secundum ASD s/p closure.  Marland Kitchen CARDIAC SURGERY    . CATARACT EXTRACTION    . CLEFT PALATE REPAIR     s/p cleft lip and palate repair  . LUMBAR LAMINECTOMY/DECOMPRESSION MICRODISCECTOMY Left 05/23/2016   Procedure: LEFT L4-L5 LATERAL RECESS DECOMPRESSION WITH CENTRAL AND RIGHT DECOMPRESSION VIA LEFT SIDE;  Surgeon: [name], MD;  Location: [place];  Service: Orthopedics;  Laterality: Left;  . LUMBAR LAMINECTOMY/DECOMPRESSION MICRODISCECTOMY Left 05/23/2016   Procedure: LUMBAR LAMINECTOMY/DECOMPRESSION MICRODISCECTOMY Lumbar five - Sacral One 1 LEVEL;  Surgeon: [name], MD;  Location: [place];  Service: Orthopedics;   Laterality: Left;  . SHOULDER INJECTION Left 05/23/2016   Procedure: SHOULDER INJECTION;  Surgeon: [name], MD;  Location: [place];  Service: Orthopedics;  Laterality: Left;  band-aid per pa-c  . TRANSTHORACIC ECHOCARDIOGRAM     12/15/05 [facility]): Mild LVH, EF > 24%, grade 1 diastolic dysfunction, Trivial MR/PR/TR.   Social History   Occupational History  . Not on file.   Social History Main Topics  . Smoking status: Current Every Day Smoker    Packs/day: 0.50    Types: Cigarettes  . Smokeless tobacco: Never Used  . Alcohol use No  . Drug use: No  . Sexual activity: Not on file

## 2017-02-16 NOTE — Progress Notes (Deleted)
Lower back pain with bilateral leg pain and burning. Pain radiates to behind the knee on both sides. Pain is constant.

## 2017-03-02 ENCOUNTER — Other Ambulatory Visit (INDEPENDENT_AMBULATORY_CARE_PROVIDER_SITE_OTHER): Payer: Self-pay

## 2017-03-02 ENCOUNTER — Telehealth (INDEPENDENT_AMBULATORY_CARE_PROVIDER_SITE_OTHER): Payer: Self-pay | Admitting: Specialist

## 2017-03-02 ENCOUNTER — Ambulatory Visit (INDEPENDENT_AMBULATORY_CARE_PROVIDER_SITE_OTHER): Payer: PRIVATE HEALTH INSURANCE | Admitting: Surgery

## 2017-03-02 MED ORDER — TRAMADOL HCL 50 MG PO TABS: 50.0000 mg | ORAL_TABLET | Freq: Two times a day (BID) | ORAL | 0 refills | Status: DC | PRN

## 2017-03-02 NOTE — Telephone Encounter (Signed)
Patient called asked if she can get something mild for pain. The number to contact patient is [phone]

## 2017-03-02 NOTE — Telephone Encounter (Signed)
Patient called asked if she can get something mild for pain. The number to contact patient is [phone]

## 2017-03-06 ENCOUNTER — Other Ambulatory Visit (INDEPENDENT_AMBULATORY_CARE_PROVIDER_SITE_OTHER): Payer: Self-pay | Admitting: Specialist

## 2017-03-06 MED ORDER — TRAMADOL HCL 50 MG PO TABS: 50.0000 mg | ORAL_TABLET | Freq: Two times a day (BID) | ORAL | 0 refills | Status: DC | PRN

## 2017-03-06 NOTE — Telephone Encounter (Signed)
Rx for tramadol printed and signed and may be sent to her pharmacy.

## 2017-03-07 NOTE — Telephone Encounter (Signed)
I called pt to confirm her pharmacy and she advised that Jeneen Rinks had given her an rx last week when her husband was in the office seeing Jeneen Rinks.

## 2017-03-09 ENCOUNTER — Ambulatory Visit (INDEPENDENT_AMBULATORY_CARE_PROVIDER_SITE_OTHER): Payer: PRIVATE HEALTH INSURANCE | Admitting: Surgery

## 2017-04-14 ENCOUNTER — Other Ambulatory Visit (INDEPENDENT_AMBULATORY_CARE_PROVIDER_SITE_OTHER): Payer: Self-pay | Admitting: Specialist

## 2017-04-14 NOTE — Telephone Encounter (Signed)
[name],Brandy [name] Aug 16, 1965   Going out of town next week need med refill.Pt stated tramadol is not working.pt would like to discuss other medications for her pain.

## 2017-04-18 NOTE — Telephone Encounter (Signed)
I will not prescribe a stronger narcotic unless there is a problem that is surgically correctable and we can expect a  Resolution of the pain and need for pain medication. Current findings do not warrant stronger narcotic. jen

## 2017-04-18 NOTE — Telephone Encounter (Signed)
Going out of town next week need med refill.Pt stated tramadol is not working.pt would like to discuss other medications for her pain.

## 2017-04-20 MED ORDER — TRAMADOL HCL 50 MG PO TABS: 50.0000 mg | ORAL_TABLET | Freq: Two times a day (BID) | ORAL | 0 refills | Status: DC | PRN

## 2017-04-21 ENCOUNTER — Encounter: Payer: 59 | Attending: Psychology | Admitting: Psychology

## 2017-04-21 NOTE — Telephone Encounter (Signed)
Called to [facility] 

## 2017-06-06 HISTORY — PX: EYE SURGERY: SHX253

## 2017-06-19 ENCOUNTER — Encounter: Payer: Self-pay | Admitting: Physician Assistant

## 2017-06-27 ENCOUNTER — Other Ambulatory Visit (INDEPENDENT_AMBULATORY_CARE_PROVIDER_SITE_OTHER): Payer: BLUE CROSS/BLUE SHIELD

## 2017-06-27 ENCOUNTER — Ambulatory Visit: Payer: BLUE CROSS/BLUE SHIELD | Admitting: Physician Assistant

## 2017-06-27 ENCOUNTER — Encounter: Payer: Self-pay | Admitting: Physician Assistant

## 2017-06-27 ENCOUNTER — Encounter (INDEPENDENT_AMBULATORY_CARE_PROVIDER_SITE_OTHER): Payer: Self-pay

## 2017-06-27 VITALS — BP 116/78 | HR 64 | Ht 67.0 in | Wt 205.0 lb

## 2017-06-27 DIAGNOSIS — R945 Abnormal results of liver function studies: Secondary | ICD-10-CM | POA: Diagnosis not present

## 2017-06-27 DIAGNOSIS — R7989 Other specified abnormal findings of blood chemistry: Secondary | ICD-10-CM

## 2017-06-27 DIAGNOSIS — Z1211 Encounter for screening for malignant neoplasm of colon: Secondary | ICD-10-CM

## 2017-06-27 LAB — PROTIME-INR
INR: 0.9 ratio (ref 0.8–1.0)
Prothrombin Time: 10.2 s (ref 9.6–13.1)

## 2017-06-27 LAB — FERRITIN: Ferritin: 46.6 ng/mL (ref 10.0–291.0)

## 2017-06-27 LAB — SEDIMENTATION RATE: SED RATE: 9 mm/h (ref 0–30)

## 2017-06-27 NOTE — Progress Notes (Signed)
Subjective:    Patient ID: Murvin Donning, female    DOB: 11/07/1965, 52 y.o.   MRN: [id number]  HPI Analisia is a pleasant 52 year old African-American female, new to GI today referred by [facility] urgent care/ Scott Long   PA-C for evaluation of elevated liver function tests. Patient has not had any prior GI evaluation, and says she has never been told that she had elevated liver tests in the past or any liver issues. She does have history of spinal stenosis for which she is currently disabled/retired, she does require chronic narcotics. She also has history of an atrial septal defect, and is legally blind in the right eye. Patient had labs done in January 2019 with finding of an AST 38, ALT of 47. CBC was unremarkable as well remainder of chemistries. I reviewed her previous labs and in December 2017 ALT 56 AST of 41, in 2015 ALT 94 AST of 49 and LFTs in 2010 were within normal limits. She's not had any abdominal imaging. Patient has no prior history of hepatitis, she had previously been employed as a Marine scientist and believes that she was immunized for hepatitis B. There is no family history of liver disease that she is aware of, she does have family history of autoimmune disease with lupus in a sister, and aunt and a cousin. Patient does not drink alcohol. She has no complaints of abdominal discomfort, no nausea or vomiting. Appetite has been fine, weight has been stable, no issues with fluid retention. Patient has not had any prior colon cancer screening.  Review of Systems;Pertinent positive and negative review of systems were noted in the above HPI section.  All other review of systems was otherwise negative.  Outpatient Encounter Medications as of 06/27/2017  Medication Sig  . acetaminophen (TYLENOL) 500 MG tablet Take 1,000 mg by mouth every 6 (six) hours as needed (for pain.).  Marland Kitchen amitriptyline (ELAVIL) 75 MG tablet TAKE 1 TABLET (75 MG) BY MOUTH AT BEDTIME  . HYDROcodone-acetaminophen  (NORCO) 7.5-325 MG tablet Take 1 tablet by mouth 6 (six) times daily.  . meclizine (ANTIVERT) 50 MG tablet Take 1 tablet (50 mg total) by mouth 3 (three) times daily as needed.  . methocarbamol (ROBAXIN) 500 MG tablet Take 1 tablet (500 mg total) by mouth 3 (three) times daily.  . naproxen (NAPROSYN) 500 MG tablet Take 1 tablet (500 mg total) by mouth 2 (two) times daily with a meal.  . traMADol-acetaminophen (ULTRACET) 37.5-325 MG tablet Take 1 tablet by mouth every 6 (six) hours as needed.  . [DISCONTINUED] HYDROcodone-acetaminophen (NORCO/VICODIN) 5-325 MG tablet Take 1 tablet by mouth every 6 (six) hours as needed for moderate pain.  . [DISCONTINUED] HYDROcodone-acetaminophen (NORCO/VICODIN) 5-325 MG tablet Take 1 tablet by mouth every 12 (twelve) hours as needed for moderate pain.  . [DISCONTINUED] traMADol (ULTRAM) 50 MG tablet Take 1 tablet (50 mg total) every 12 (twelve) hours as needed by mouth.   No facility-administered encounter medications on file as of 06/27/2017.    Allergies  Allergen Reactions  . Zithromax [Azithromycin] Shortness Of Breath and Itching    TOTAL BODY ITCHING [EVEN SOLES OF FEET] WHEEZING   . Tramadol Other (See Comments)    Has taken recently without any side effects.   Patient Active Problem List   Diagnosis Date Noted  . 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 Left shoulder tendonitis 05/23/2016    Class: Acute  . Post-op pain 05/23/2016  Social History   Socioeconomic History  . Marital status: Married    Spouse name: Not on file  . Number of children: 3  . Years of education: Not on file  . Highest education level: Not on file  Social Needs  . Financial resource strain: Not on file  . Food insecurity - worry: Not on file  . Food insecurity - inability: Not on file  . Transportation needs - medical: Not on file  . Transportation needs - non-medical: Not on file  Occupational History  .  Not on file  Tobacco Use  . Smoking status: Current Every Day Smoker    Packs/day: 0.50    Types: Cigarettes  . Smokeless tobacco: Never Used  Substance and Sexual Activity  . Alcohol use: No  . Drug use: No  . Sexual activity: Not on file  Other Topics Concern  . Not on file  Social History Narrative  . Not on file    Ms. [name]'s family history includes Cancer in her father; Diabetes in her mother; High blood pressure in her mother.      Objective:    Vitals:   06/27/17 0939  BP: 116/78  Pulse: 64    Physical Exam; well-developed African-American female in no acute distress, pleasant blood pressure 116/78 pulse 64, Height 5 foot 7, weight 205, BMI 32.1. HEENT; nontraumatic normocephalic, Patient has blindness in the right eye, Cardiovascular; regular rate and rhythm with S1-S2 no murmur or gallop, Pulmonary ;clear bilaterally, Abdomen ;soft, nontender nondistended bowel sounds are active there is no palpable mass or hepatosplenomegaly, no fluid wave it'll exam not done, Extremities ;no clubbing cyanosis or edema skin warm and dry, Neuropsych ;mood and affect appropriate        Assessment & Plan:   #69 52 year old African-American female with mild transaminitis. By review of prior labs she has had persistent mild transaminitis since at least 2015. Etiology is not clear. Will rule out nonalcoholic fatty liver disease/[place], versus other underlying chronic mild hepatitis/hepatopathy. Will rule out autoimmune disease , infectious etiologies and genetic/inheritable forms of liver disease  #2 colon cancer screening-patient has not had prior colonoscopy. She's average risk. She is reluctant to proceed with colonoscopy, but agreeable to proceed with Cologuard stool DNA testing #3 spinal stenosis #4 chronic narcotic use #5 likely blindness right eye   #6 history of atrial septal defect  Plan Will schedule for upper abdominal ultrasound Check viral hepatitis serologies,  ferritin, ANA, AMA, anti-smooth muscle antibody, ceruloplasmin, alpha 1 antitrypsin level. We will order colon guard stool DNA test. Will schedule for follow-up office visit with me in one month. Patient will also  be established with Dr. Silverio Decamp.   Edmund Holcomb Genia Harold PA-C 06/27/2017   Cc:  Nicki Reaper Long PA-C

## 2017-06-27 NOTE — Patient Instructions (Signed)
Please go to the basement level to have your labs drawn.  You have been scheduled for an abdominal ultrasound at [facility] (1st floor of hospital) on Thursday 06-29-2017 at 10:30 am. Please arrive at 10:15 am to your appointment for registration. Make certain not to have anything to eat or drink 6 hours prior to your appointment. Should you need to reschedule your appointment, please contact radiology at [phone]. This test typically takes about 30 minutes to perform.  If you are age 38 or younger, your body mass index should be between 19-25. Your Body mass index is 32.11 kg/m. If this is out of the aformentioned range listed, please consider follow up with your Primary Care Provider.   Your provider has ordered Cologuard testing as an option for colon cancer screening. This is performed by [employer] and may be out of network with your insurance. PRIOR to completing the test, it is YOUR responsibility to contact your insurance about covered benefits for this test. Your out of pocket expense could be anywhere from $0.00 to $649.00.   When you call to check coverage with your insurer, please provide the following information:   -The ONLY provider of Cologuard is [place] code for Cologuard is [phone].  [employer] NPI # [id number]  -[employer] Tax ID # [id number]   We have already sent your demographic and insurance information to [employer] (phone number [phone]) and they should contact you within the next week regarding your test. If you have not heard from them within the next week, please call our office at [phone].

## 2017-06-29 ENCOUNTER — Ambulatory Visit (HOSPITAL_COMMUNITY): Payer: BLUE CROSS/BLUE SHIELD

## 2017-06-30 ENCOUNTER — Ambulatory Visit (HOSPITAL_COMMUNITY)
Admission: RE | Admit: 2017-06-30 | Discharge: 2017-06-30 | Disposition: A | Discharge status: Home / Self Care | Payer: BLUE CROSS/BLUE SHIELD | Source: Ambulatory Visit | Attending: Physician Assistant | Admitting: Physician Assistant

## 2017-06-30 DIAGNOSIS — R945 Abnormal results of liver function studies: Secondary | ICD-10-CM | POA: Insufficient documentation

## 2017-06-30 DIAGNOSIS — Z1211 Encounter for screening for malignant neoplasm of colon: Secondary | ICD-10-CM | POA: Diagnosis present

## 2017-06-30 DIAGNOSIS — R7989 Other specified abnormal findings of blood chemistry: Secondary | ICD-10-CM

## 2017-06-30 LAB — HEPATITIS B SURFACE ANTIBODY,QUALITATIVE: HEP B S AB: NONREACTIVE

## 2017-06-30 LAB — HEPATITIS A ANTIBODY, TOTAL: Hepatitis A AB,Total: NONREACTIVE

## 2017-06-30 LAB — HEPATITIS C ANTIBODY
Hepatitis C Ab: NONREACTIVE
SIGNAL TO CUT-OFF: 0.02 (ref <1.00)

## 2017-06-30 LAB — MITOCHONDRIAL ANTIBODIES

## 2017-06-30 LAB — CERULOPLASMIN: CERULOPLASMIN: 30 mg/dL (ref 18–53)

## 2017-06-30 LAB — ANTI-SMOOTH MUSCLE ANTIBODY, IGG

## 2017-06-30 LAB — HEPATITIS B SURFACE ANTIGEN: Hepatitis B Surface Ag: NONREACTIVE

## 2017-06-30 LAB — ALPHA-1-ANTITRYPSIN: A1 ANTITRYPSIN SER: 124 mg/dL (ref 83–199)

## 2017-06-30 LAB — ANA: Anti Nuclear Antibody(ANA): NEGATIVE

## 2017-06-30 NOTE — Progress Notes (Signed)
Reviewed and agree with documentation and assessment and plan. K. Veena Oluwaseun Cremer , MD   

## 2017-07-10 ENCOUNTER — Ambulatory Visit: Payer: BLUE CROSS/BLUE SHIELD | Admitting: Skilled Nursing Facility1

## 2017-07-17 ENCOUNTER — Other Ambulatory Visit: Payer: Self-pay

## 2017-07-17 LAB — COLOGUARD: COLOGUARD: POSITIVE

## 2017-07-20 ENCOUNTER — Other Ambulatory Visit: Payer: Self-pay

## 2017-07-20 ENCOUNTER — Encounter: Payer: Self-pay | Admitting: Gastroenterology

## 2017-07-20 ENCOUNTER — Ambulatory Visit (AMBULATORY_SURGERY_CENTER): Payer: Self-pay | Admitting: *Deleted

## 2017-07-20 VITALS — Ht 67.0 in | Wt 206.8 lb

## 2017-07-20 DIAGNOSIS — R195 Other fecal abnormalities: Secondary | ICD-10-CM

## 2017-07-20 NOTE — Progress Notes (Signed)
No egg or soy allergy known to patient  No issues with past sedation with any surgeries  or procedures, no intubation problems  No diet pills per patient No home 02 use per patient  No blood thinners per patient  Pt denies issues with constipation  No A fib or A flutter  EMMI video sent to pt's e mail colo

## 2017-07-28 ENCOUNTER — Ambulatory Visit (AMBULATORY_SURGERY_CENTER): Payer: BLUE CROSS/BLUE SHIELD | Admitting: Gastroenterology

## 2017-07-28 ENCOUNTER — Encounter: Payer: Self-pay | Admitting: Gastroenterology

## 2017-07-28 ENCOUNTER — Other Ambulatory Visit: Payer: Self-pay

## 2017-07-28 VITALS — BP 159/92 | HR 87 | Temp 98.4°F | Resp 23 | Ht 67.0 in | Wt 206.0 lb

## 2017-07-28 DIAGNOSIS — Z538 Procedure and treatment not carried out for other reasons: Secondary | ICD-10-CM | POA: Diagnosis not present

## 2017-07-28 DIAGNOSIS — R195 Other fecal abnormalities: Secondary | ICD-10-CM

## 2017-07-28 MED ORDER — FLEET ENEMA 7-19 GM/118ML RE ENEM
1.0000 | ENEMA | Freq: Once | RECTAL | Status: AC
  Administered 2017-07-28: 1 via RECTAL

## 2017-07-28 MED ORDER — SODIUM CHLORIDE 0.9 % IV SOLN: 500.0000 mL | Freq: Once | INTRAVENOUS | Status: DC

## 2017-07-28 NOTE — Patient Instructions (Signed)
YOU HAD AN ENDOSCOPIC PROCEDURE TODAY AT THE [facility]:   Refer to the procedure report that was given to you for any specific questions about what was found during the examination.  If the procedure report does not answer your questions, please call your gastroenterologist to clarify.  If you requested that your care partner not be given the details of your procedure findings, then the procedure report has been included in a sealed envelope for you to review at your convenience later.  YOU SHOULD EXPECT: Some feelings of bloating in the abdomen. Passage of more gas than usual.  Walking can help get rid of the air that was put into your GI tract during the procedure and reduce the bloating. If you had a lower endoscopy (such as a colonoscopy or flexible sigmoidoscopy) you may notice spotting of blood in your stool or on the toilet paper. If you underwent a bowel prep for your procedure, you may not have a normal bowel movement for a few days.  Please Note:  You might notice some irritation and congestion in your nose or some drainage.  This is from the oxygen used during your procedure.  There is no need for concern and it should clear up in a day or so.  SYMPTOMS TO REPORT IMMEDIATELY:   Following lower endoscopy (colonoscopy or flexible sigmoidoscopy):  Excessive amounts of blood in the stool  Significant tenderness or worsening of abdominal pains  Swelling of the abdomen that is new, acute  Fever of 100F or higher  For urgent or emergent issues, a gastroenterologist can be reached at any hour by calling [phone].   DIET:  We do recommend a small meal at first, but then you may proceed to your regular diet.  Drink plenty of fluids but you should avoid alcoholic beverages for 24 hours.  ACTIVITY:  You should plan to take it easy for the rest of today and you should NOT DRIVE or use heavy machinery until tomorrow (because of the sedation medicines used during the test).     FOLLOW UP: Our staff will call the number listed on your records the next business day following your procedure to check on you and address any questions or concerns that you may have regarding the information given to you following your procedure. If we do not reach you, we will leave a message.  However, if you are feeling well and you are not experiencing any problems, there is no need to return our call.  We will assume that you have returned to your regular daily activities without incident.  If any biopsies were taken you will be contacted by phone or by letter within the next 1-3 weeks.  Please call us at [phone] if you have not heard about the biopsies in 3 weeks.    SIGNATURES/CONFIDENTIALITY: You and/or your care partner have signed paperwork which will be entered into your electronic medical record.  These signatures attest to the fact that that the information above on your After Visit Summary has been reviewed and is understood.  Full responsibility of the confidentiality of this discharge information lies with you and/or your care-partner. 

## 2017-07-28 NOTE — Progress Notes (Signed)
Pt's states no medical or surgical changes since previsit or office visit. 

## 2017-07-28 NOTE — Op Note (Signed)
[facility] Patient Name: Brandy [name] Procedure Date: 07/28/2017 8:36 AM MRN: [id number] Endoscopist: Mauri Pole , MD Age: 52 Referring MD:  Date of Birth: 10/18/1965 Gender: Female Account #: [id number] Procedure:                Colonoscopy Indications:              Positive Cologuard test Medicines:                Monitored Anesthesia Care Procedure:                Pre-Anesthesia Assessment:                           - Prior to the procedure, a History and Physical                            was performed, and patient medications and                            allergies were reviewed. The patient's tolerance of                            previous anesthesia was also reviewed. The risks                            and benefits of the procedure and the sedation                            options and risks were discussed with the patient.                            All questions were answered, and informed consent                            was obtained. Prior Anticoagulants: The patient has                            taken no previous anticoagulant or antiplatelet                            agents. ASA Grade Assessment: II - A patient with                            mild systemic disease. After reviewing the risks                            and benefits, the patient was deemed in                            satisfactory condition to undergo the procedure.                           After obtaining informed consent, the colonoscope  was passed under direct vision. Throughout the                            procedure, the patient's blood pressure, pulse, and                            oxygen saturations were monitored continuously. The                            Model PCF-H190DL [id number]) scope was introduced                            through the anus and advanced to the the cecum,                            identified by appendiceal  orifice and ileocecal                            valve. The colonoscopy was technically difficult                            and complex due to poor bowel prep with stool                            present. Successful completion of the procedure was                            aided by lavage. The patient tolerated the                            procedure well. The quality of the bowel                            preparation was poor. The ileocecal valve,                            appendiceal orifice, and rectum were photographed. Scope In: 8:44:48 AM Scope Out: 9:02:01 AM Scope Withdrawal Time: 0 hours 5 minutes 59 seconds  Total Procedure Duration: 0 hours 17 minutes 13 seconds  Findings:                 The perianal exam findings include weak anal                            sphincter with decreased tone.                           A large amount of semi-solid stool was found in the                            entire colon, making visualization difficult.                           Retroflexion not performed due to poor prep and  narrow rectal vault Complications:            No immediate complications. Estimated Blood Loss:     Estimated blood loss: none. Impression:               - Preparation of the colon was poor.                           - Weak anal sphincter with decreased tone found on                            perianal exam.                           - Stool in the entire examined colon.                           - No specimens collected. Recommendation:           - Patient has a contact number available for                            emergencies. The signs and symptoms of potential                            delayed complications were discussed with the                            patient. Return to normal activities tomorrow.                            Written discharge instructions were provided to the                            patient.                            - Resume previous diet.                           - Continue present medications.                           - Repeat colonoscopy at the next available                            appointment because the bowel preparation was poor.                           - For future colonoscopy the patient will require                            an extended preparation. If there are any                            questions, please contact the gastroenterologist. Mauri Pole, MD 07/28/2017 9:08:29 AM This report has been signed electronically.

## 2017-07-28 NOTE — Progress Notes (Signed)
To PACU, VSS. Report to [email or web address] 

## 2017-07-31 ENCOUNTER — Telehealth: Payer: Self-pay | Admitting: *Deleted

## 2017-07-31 NOTE — Telephone Encounter (Signed)
Left message on f/u call 

## 2017-07-31 NOTE — Telephone Encounter (Signed)
  Follow up Call-  Call back number 07/28/2017  Post procedure Call Back phone  # [phone]  Permission to leave phone message Yes  Some recent data might be hidden     No answer at # given.  LM on VM.

## 2017-08-02 ENCOUNTER — Other Ambulatory Visit: Payer: Self-pay

## 2017-08-02 ENCOUNTER — Ambulatory Visit (AMBULATORY_SURGERY_CENTER): Payer: Self-pay | Admitting: *Deleted

## 2017-08-02 VITALS — Ht 67.0 in | Wt 204.0 lb

## 2017-08-02 DIAGNOSIS — J0140 Acute pansinusitis, unspecified: Secondary | ICD-10-CM

## 2017-08-02 DIAGNOSIS — M545 Low back pain, unspecified: Secondary | ICD-10-CM | POA: Insufficient documentation

## 2017-08-02 DIAGNOSIS — G43909 Migraine, unspecified, not intractable, without status migrainosus: Secondary | ICD-10-CM | POA: Insufficient documentation

## 2017-08-02 DIAGNOSIS — G629 Polyneuropathy, unspecified: Secondary | ICD-10-CM | POA: Insufficient documentation

## 2017-08-02 DIAGNOSIS — G4700 Insomnia, unspecified: Secondary | ICD-10-CM | POA: Insufficient documentation

## 2017-08-02 DIAGNOSIS — R195 Other fecal abnormalities: Secondary | ICD-10-CM

## 2017-08-02 HISTORY — DX: Acute pansinusitis, unspecified: J01.40

## 2017-08-02 MED ORDER — NA SULFATE-K SULFATE-MG SULF 17.5-3.13-1.6 GM/177ML PO SOLN: ORAL | 0 refills | Status: DC

## 2017-08-02 NOTE — Progress Notes (Signed)
Patient denies any allergies to eggs or soy. Patient denies any problems with anesthesia/sedation. Patient denies any oxygen use at home. Patient denies taking any diet/weight loss medications or blood thinners. EMMI education declined by patient. Suprep sample given to pt.  Encouraged patient to follow the instructions!

## 2017-08-15 ENCOUNTER — Encounter: Payer: Self-pay | Admitting: Gastroenterology

## 2017-08-15 ENCOUNTER — Ambulatory Visit (AMBULATORY_SURGERY_CENTER): Payer: BLUE CROSS/BLUE SHIELD | Admitting: Gastroenterology

## 2017-08-15 VITALS — BP 116/80 | HR 85 | Temp 97.3°F | Resp 10 | Ht 67.0 in | Wt 204.0 lb

## 2017-08-15 DIAGNOSIS — R195 Other fecal abnormalities: Secondary | ICD-10-CM | POA: Diagnosis present

## 2017-08-15 DIAGNOSIS — D125 Benign neoplasm of sigmoid colon: Secondary | ICD-10-CM | POA: Diagnosis not present

## 2017-08-15 MED ORDER — SODIUM CHLORIDE 0.9 % IV SOLN: 500.0000 mL | INTRAVENOUS | Status: DC

## 2017-08-15 NOTE — Op Note (Signed)
[facility] Patient Name: Brandy [name] Procedure Date: 08/15/2017 3:33 PM MRN: [id number] Endoscopist: Mauri Pole , MD Age: 52 Referring MD:  Date of Birth: 03/31/1966 Gender: Female Account #: [id number] Procedure:                Colonoscopy Indications:              Positive Cologuard test Medicines:                Monitored Anesthesia Care Procedure:                Pre-Anesthesia Assessment:                           - Prior to the procedure, a History and Physical                            was performed, and patient medications and                            allergies were reviewed. The patient's tolerance of                            previous anesthesia was also reviewed. The risks                            and benefits of the procedure and the sedation                            options and risks were discussed with the patient.                            All questions were answered, and informed consent                            was obtained. Prior Anticoagulants: The patient has                            taken no previous anticoagulant or antiplatelet                            agents. ASA Grade Assessment: III - A patient with                            severe systemic disease. After reviewing the risks                            and benefits, the patient was deemed in                            satisfactory condition to undergo the procedure.                           After obtaining informed consent, the colonoscope  was passed under direct vision. Throughout the                            procedure, the patient's blood pressure, pulse, and                            oxygen saturations were monitored continuously. The                            Colonoscope was introduced through the anus and                            advanced to the the cecum, identified by                            appendiceal orifice and ileocecal  valve. The                            colonoscopy was performed without difficulty. The                            patient tolerated the procedure well. The quality                            of the bowel preparation was excellent. The                            ileocecal valve, appendiceal orifice, and rectum                            were photographed. Scope In: 3:44:54 PM Scope Out: 4:00:24 PM Scope Withdrawal Time: 0 hours 10 minutes 11 seconds  Total Procedure Duration: 0 hours 15 minutes 30 seconds  Findings:                 The perianal and digital rectal examinations were                            normal.                           A 2 mm polyp was found in the sigmoid colon. The                            polyp was sessile. The polyp was removed with a                            cold biopsy forceps. Resection and retrieval were                            complete.                           Scattered small and large-mouthed diverticula were  found in the sigmoid colon, descending colon,                            transverse colon and ascending colon.                           Non-bleeding internal hemorrhoids were found during                            retroflexion. The hemorrhoids were small. Complications:            No immediate complications. Estimated Blood Loss:     Estimated blood loss was minimal. Impression:               - One 2 mm polyp in the sigmoid colon, removed with                            a cold biopsy forceps. Resected and retrieved.                           - Moderate diverticulosis in the sigmoid colon, in                            the descending colon, in the transverse colon and                            in the ascending colon.                           - Non-bleeding internal hemorrhoids. Recommendation:           - Patient has a contact number available for                            emergencies. The signs and symptoms of  potential                            delayed complications were discussed with the                            patient. Return to normal activities tomorrow.                            Written discharge instructions were provided to the                            patient.                           - Resume previous diet.                           - Continue present medications.                           - Await pathology results.                           -  Repeat colonoscopy in 5-10 years for surveillance                            based on pathology results. Mauri Pole, MD 08/15/2017 4:08:08 PM This report has been signed electronically.

## 2017-08-15 NOTE — Progress Notes (Signed)
To Pacu, VSS. Report to [email or web address] 

## 2017-08-15 NOTE — Patient Instructions (Signed)
YOU HAD AN ENDOSCOPIC PROCEDURE TODAY AT THE [facility]:   Refer to the procedure report that was given to you for any specific questions about what was found during the examination.  If the procedure report does not answer your questions, please call your gastroenterologist to clarify.  If you requested that your care partner not be given the details of your procedure findings, then the procedure report has been included in a sealed envelope for you to review at your convenience later.  YOU SHOULD EXPECT: Some feelings of bloating in the abdomen. Passage of more gas than usual.  Walking can help get rid of the air that was put into your GI tract during the procedure and reduce the bloating. If you had a lower endoscopy (such as a colonoscopy or flexible sigmoidoscopy) you may notice spotting of blood in your stool or on the toilet paper. If you underwent a bowel prep for your procedure, you may not have a normal bowel movement for a few days.  Please Note:  You might notice some irritation and congestion in your nose or some drainage.  This is from the oxygen used during your procedure.  There is no need for concern and it should clear up in a day or so.  SYMPTOMS TO REPORT IMMEDIATELY:   Following lower endoscopy (colonoscopy or flexible sigmoidoscopy):  Excessive amounts of blood in the stool  Significant tenderness or worsening of abdominal pains  Swelling of the abdomen that is new, acute  Fever of 100F or higher   For urgent or emergent issues, a gastroenterologist can be reached at any hour by calling [phone].   DIET:  We do recommend a small meal at first, but then you may proceed to your regular diet.  Drink plenty of fluids but you should avoid alcoholic beverages for 24 hours.  ACTIVITY:  You should plan to take it easy for the rest of today and you should NOT DRIVE or use heavy machinery until tomorrow (because of the sedation medicines used during the test).     FOLLOW UP: Our staff will call the number listed on your records the next business day following your procedure to check on you and address any questions or concerns that you may have regarding the information given to you following your procedure. If we do not reach you, we will leave a message.  However, if you are feeling well and you are not experiencing any problems, there is no need to return our call.  We will assume that you have returned to your regular daily activities without incident.  If any biopsies were taken you will be contacted by phone or by letter within the next 1-3 weeks.  Please call us at [phone] if you have not heard about the biopsies in 3 weeks.    SIGNATURES/CONFIDENTIALITY: You and/or your care partner have signed paperwork which will be entered into your electronic medical record.  These signatures attest to the fact that that the information above on your After Visit Summary has been reviewed and is understood.  Full responsibility of the confidentiality of this discharge information lies with you and/or your care-partner.  Read all handouts given to you by your recovery room nurse. 

## 2017-08-15 NOTE — Progress Notes (Signed)
Pt's states no medical or surgical changes since previsit or office visit. 

## 2017-08-15 NOTE — Progress Notes (Signed)
Called to room to assist during endoscopic procedure.  Patient ID and intended procedure confirmed with present staff. Received instructions for my participation in the procedure from the performing physician.  

## 2017-08-16 ENCOUNTER — Telehealth: Payer: Self-pay | Admitting: *Deleted

## 2017-08-16 ENCOUNTER — Telehealth: Payer: Self-pay

## 2017-08-16 NOTE — Telephone Encounter (Signed)
  Follow up Call-  Call back number 08/15/2017 07/28/2017  Post procedure Call Back phone  # [phone]  Permission to leave phone message Yes Yes  Some recent data might be hidden     Patient questions:  Do you have a fever, pain , or abdominal swelling? No. Pain Score  0 *  Have you tolerated food without any problems? Yes.    Have you been able to return to your normal activities? Yes.    Do you have any questions about your discharge instructions: Diet   No. Medications  No. Follow up visit  No.  Do you have questions or concerns about your Care? No.  Actions: * If pain score is 4 or above: No action needed, pain <4.

## 2017-08-16 NOTE — Telephone Encounter (Signed)
  Follow up Call-  Call back number 08/15/2017 07/28/2017  Post procedure Call Back phone  # [phone]  Permission to leave phone message Yes Yes  Some recent data might be hidden     Left message

## 2017-08-27 ENCOUNTER — Encounter: Payer: Self-pay | Admitting: Gastroenterology

## 2018-05-06 ENCOUNTER — Encounter (HOSPITAL_COMMUNITY): Payer: Self-pay

## 2018-05-06 ENCOUNTER — Ambulatory Visit (HOSPITAL_COMMUNITY)
Admission: EM | Admit: 2018-05-06 | Discharge: 2018-05-06 | Disposition: A | Discharge status: Home / Self Care | Payer: BLUE CROSS/BLUE SHIELD | Attending: Family Medicine | Admitting: Family Medicine

## 2018-05-06 DIAGNOSIS — H6612 Chronic tubotympanic suppurative otitis media, left ear: Secondary | ICD-10-CM | POA: Diagnosis not present

## 2018-05-06 MED ORDER — SULFAMETHOXAZOLE-TRIMETHOPRIM 800-160 MG PO TABS: 1.0000 | ORAL_TABLET | Freq: Two times a day (BID) | ORAL | 0 refills | Status: AC

## 2018-05-06 MED ORDER — PREDNISONE 50 MG PO TABS: ORAL_TABLET | ORAL | 0 refills | Status: DC

## 2018-05-06 NOTE — ED Provider Notes (Signed)
[place]    CSN: [id number] Arrival date & time: 05/06/18  1602     History   Chief Complaint Chief Complaint  Patient presents with  . Otalgia    Left    HPI Brandy [name] is a 52 y.o. female.   This is an established urgent care 52 year old woman with left ear pain.  He is had several days of increasing sinus congestion and pressure on that left ear, and then felt a pop yesterday with subsequent bloody purulent drainage.  Patient was born with a cleft palate.  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  She smokes and has had quite a bit of coughing and congestion as well.  She was with her grandchildren recently and they both had colds.     Past Medical History:  Diagnosis Date  . Arthritis   . ASD (atrial septal defect)    s/p closure with Amplatzer device 10/05/04 (Dr. Myriam Jacobson, [facility]) 10/05/04  . Cataract   . GERD (gastroesophageal reflux disease)   . Headache   . Heart murmur    no longer heard  . Legally blind in right eye, as defined in [place]     Patient Active Problem List   Diagnosis Date Noted  . Acute pansinusitis 08/02/2017  . Insomnia 08/02/2017  . Low back pain 08/02/2017  . Migraine 08/02/2017  . Neuropathy 08/02/2017  . 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 Left shoulder tendonitis 05/23/2016    Class: Acute  . Post-op pain 05/23/2016  . Anxiety disorder 01/07/2015    Past Surgical History:  Procedure Laterality Date  . ABLATION    . BREAST SURGERY    . BUNIONECTOMY    . CARDIAC CATHETERIZATION     10/05/04 [facility]): LM < 25%, otherwise normal coronaries. No pulmonary HTN, Mildly enlarged RV. Secundum ASD s/p closure.  Marland Kitchen CARDIAC SURGERY    . CATARACT EXTRACTION    . CLEFT PALATE REPAIR     s/p cleft lip and palate repair  . LUMBAR LAMINECTOMY/DECOMPRESSION MICRODISCECTOMY Left 05/23/2016   Procedure: LEFT L4-L5 LATERAL RECESS  DECOMPRESSION WITH CENTRAL AND RIGHT DECOMPRESSION VIA LEFT SIDE;  Surgeon: [name], MD;  Location: [place];  Service: Orthopedics;  Laterality: Left;  . LUMBAR LAMINECTOMY/DECOMPRESSION MICRODISCECTOMY Left 05/23/2016   Procedure: LUMBAR LAMINECTOMY/DECOMPRESSION MICRODISCECTOMY Lumbar five - Sacral One 1 LEVEL;  Surgeon: [name], MD;  Location: [place];  Service: Orthopedics;  Laterality: Left;  . SHOULDER INJECTION Left 05/23/2016   Procedure: SHOULDER INJECTION;  Surgeon: [name], MD;  Location: [place];  Service: Orthopedics;  Laterality: Left;  band-aid per pa-c  . TRANSTHORACIC ECHOCARDIOGRAM     12/15/05 [facility]): Mild LVH, EF > 46%, grade 1 diastolic dysfunction, Trivial MR/PR/TR.    OB History   None      Home Medications    Prior to Admission medications   Medication Sig Start Date End Date Taking? Authorizing Provider  amitriptyline (ELAVIL) 75 MG tablet TAKE 1 TABLET (75 MG) BY MOUTH AT BEDTIME 01/27/15   [provider]  baclofen (LIORESAL) 10 MG tablet TK 1 T PO TID PRN 08/04/17   [provider]  gabapentin (NEURONTIN) 300 MG capsule gabapentin 300 mg capsule    [provider]  oxyCODONE (OXY IR/ROXICODONE) 5 MG immediate release tablet TK 1 T PO TID PRN 07/12/17   [provider]  predniSONE (DELTASONE) 50 MG tablet 1 daily with food 05/06/18   Robyn Haber, MD  sulfamethoxazole-trimethoprim (BACTRIM DS,SEPTRA DS) 800-160 MG tablet Take 1 tablet by mouth 2 (two) times daily for 7 days. 05/06/18 05/13/18  Robyn Haber, MD    Family History Family History  Problem Relation Age of Onset  . Diabetes Mother   . High blood pressure Mother   . Cancer Father        Lung  . Colon cancer Neg Hx   . Colon polyps Neg Hx   . Esophageal cancer Neg Hx   . Rectal cancer Neg Hx   . Stomach cancer Neg Hx     Social History Social History   Tobacco Use  . Smoking status: Current Every Day Smoker    Packs/day: 0.50    Types:  Cigarettes  . Smokeless tobacco: Never Used  Substance Use Topics  . Alcohol use: No  . Drug use: No     Allergies   Zithromax [azithromycin]; Psyllium; and Tramadol   Review of Systems Review of Systems  HENT: Positive for ear discharge and ear pain.   Respiratory: Positive for cough.      Physical Exam Triage Vital Signs ED Triage Vitals  Enc Vitals Group     BP 05/06/18 1654 128/68     Pulse Rate 05/06/18 1654 (!) 103     Resp 05/06/18 1654 20     Temp 05/06/18 1654 97.7 F (36.5 C)     Temp Source 05/06/18 1654 Oral     SpO2 05/06/18 1654 100 %     Weight --      Height --      Head Circumference --      Peak Flow --      Pain Score 05/06/18 1653 7     Pain Loc --      Pain Edu? --      Excl. in [place]? --    No data found.  Updated Vital Signs BP 128/68 (BP Location: Right Arm)   Pulse (!) 103   Temp 97.7 F (36.5 C) (Oral)   Resp 20   SpO2 100%    Physical Exam  Constitutional: She is oriented to person, place, and time. She appears well-developed and well-nourished.  HENT:  Drainage from left ear with obvious scarring on the left TM, some scarring on the right TM with dry canal on that side.  Eyes:  Right exotropia, left exophthalmos, scar right cornea  Neck: Normal range of motion. Neck supple.  Pulmonary/Chest: Effort normal. She has wheezes.  Musculoskeletal: Normal range of motion.  Neurological: She is alert and oriented to person, place, and time.  Skin: Skin is warm and dry.  Nursing note and vitals reviewed.    UC Treatments / Results  Labs (all labs ordered are listed, but only abnormal results are displayed) Labs Reviewed - No data to display  EKG None  Radiology No results found.  Procedures Procedures (including critical care time)  Medications Ordered in UC Medications - No data to display  Initial Impression / Assessment and Plan / UC Course  I have reviewed the triage vital signs and the nursing notes.  Pertinent  labs & imaging results that were available during my care of the patient were reviewed by me and considered in my medical decision making (see chart for details).    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 Provider   sulfamethoxazole-trimethoprim (BACTRIM DS,SEPTRA DS) 800-160 MG tablet Take 1 tablet by mouth 2 (two) times daily for 7 days. 14 tablet Robyn Haber, MD   predniSONE (DELTASONE) 50 MG tablet 1 daily with food 3 tablet Robyn Haber, MD     Controlled Substance Prescriptions [place] Controlled Substance Registry consulted? Not Applicable   Robyn Haber, MD 05/06/18 [phone]

## 2018-05-06 NOTE — ED Triage Notes (Signed)
Pt presents with left ear pain; possible ear infection.  Pt states she gets ear infections frequently.

## 2018-08-02 ENCOUNTER — Other Ambulatory Visit: Payer: Self-pay | Admitting: Student

## 2018-08-02 ENCOUNTER — Other Ambulatory Visit (HOSPITAL_COMMUNITY): Payer: Self-pay | Admitting: Student

## 2018-08-02 DIAGNOSIS — M5442 Lumbago with sciatica, left side: Principal | ICD-10-CM

## 2018-08-02 DIAGNOSIS — G8929 Other chronic pain: Secondary | ICD-10-CM

## 2018-08-02 DIAGNOSIS — M5441 Lumbago with sciatica, right side: Principal | ICD-10-CM

## 2018-08-22 ENCOUNTER — Ambulatory Visit (HOSPITAL_COMMUNITY)
Admission: RE | Admit: 2018-08-22 | Discharge: 2018-08-22 | Disposition: A | Discharge status: Home / Self Care | Payer: BLUE CROSS/BLUE SHIELD | Source: Ambulatory Visit | Attending: Student | Admitting: Student

## 2018-08-22 ENCOUNTER — Other Ambulatory Visit: Payer: Self-pay

## 2018-08-22 DIAGNOSIS — M5441 Lumbago with sciatica, right side: Secondary | ICD-10-CM | POA: Insufficient documentation

## 2018-08-22 DIAGNOSIS — G8929 Other chronic pain: Secondary | ICD-10-CM | POA: Insufficient documentation

## 2018-08-22 DIAGNOSIS — M5442 Lumbago with sciatica, left side: Secondary | ICD-10-CM | POA: Diagnosis not present

## 2018-09-05 ENCOUNTER — Other Ambulatory Visit: Payer: Self-pay | Admitting: Student

## 2018-09-05 DIAGNOSIS — M5416 Radiculopathy, lumbar region: Secondary | ICD-10-CM

## 2018-11-20 ENCOUNTER — Other Ambulatory Visit: Payer: Self-pay

## 2018-11-20 ENCOUNTER — Ambulatory Visit
Admission: RE | Admit: 2018-11-20 | Discharge: 2018-11-20 | Disposition: A | Discharge status: Home / Self Care | Payer: BC Managed Care – PPO | Source: Ambulatory Visit | Attending: Student | Admitting: Student

## 2018-11-20 DIAGNOSIS — M5416 Radiculopathy, lumbar region: Secondary | ICD-10-CM

## 2018-11-20 MED ORDER — METHYLPREDNISOLONE ACETATE 40 MG/ML INJ SUSP (RADIOLOG
120.0000 mg | Freq: Once | INTRAMUSCULAR | Status: AC
  Administered 2018-11-20: 08:00:00 120 mg via EPIDURAL

## 2018-11-20 MED ORDER — IOPAMIDOL (ISOVUE-M 200) INJECTION 41%
1.0000 mL | Freq: Once | INTRAMUSCULAR | Status: AC
  Administered 2018-11-20: 08:00:00 1 mL via EPIDURAL

## 2018-11-20 NOTE — Discharge Instructions (Signed)

## 2019-01-22 ENCOUNTER — Other Ambulatory Visit: Payer: Self-pay

## 2019-01-22 DIAGNOSIS — Z20822 Contact with and (suspected) exposure to covid-19: Secondary | ICD-10-CM

## 2019-01-23 LAB — NOVEL CORONAVIRUS, NAA: SARS-CoV-2, NAA: NOT DETECTED

## 2019-01-24 DIAGNOSIS — R29898 Other symptoms and signs involving the musculoskeletal system: Secondary | ICD-10-CM | POA: Insufficient documentation

## 2019-02-14 DIAGNOSIS — H43812 Vitreous degeneration, left eye: Secondary | ICD-10-CM | POA: Insufficient documentation

## 2019-02-14 DIAGNOSIS — H43392 Other vitreous opacities, left eye: Secondary | ICD-10-CM | POA: Insufficient documentation

## 2019-02-14 DIAGNOSIS — H0520 Unspecified exophthalmos: Secondary | ICD-10-CM | POA: Insufficient documentation

## 2019-02-14 DIAGNOSIS — H31002 Unspecified chorioretinal scars, left eye: Secondary | ICD-10-CM | POA: Insufficient documentation

## 2019-02-18 ENCOUNTER — Telehealth: Payer: Self-pay | Admitting: Hematology

## 2019-02-18 ENCOUNTER — Emergency Department (HOSPITAL_COMMUNITY): Payer: BC Managed Care – PPO

## 2019-02-18 ENCOUNTER — Encounter (HOSPITAL_COMMUNITY): Payer: Self-pay

## 2019-02-18 ENCOUNTER — Emergency Department (HOSPITAL_COMMUNITY)
Admission: EM | Admit: 2019-02-18 | Discharge: 2019-02-18 | Disposition: A | Discharge status: Home / Self Care | Payer: BC Managed Care – PPO | Attending: Emergency Medicine | Admitting: Emergency Medicine

## 2019-02-18 ENCOUNTER — Other Ambulatory Visit: Payer: Self-pay

## 2019-02-18 DIAGNOSIS — Z79899 Other long term (current) drug therapy: Secondary | ICD-10-CM | POA: Diagnosis not present

## 2019-02-18 DIAGNOSIS — N6310 Unspecified lump in the right breast, unspecified quadrant: Secondary | ICD-10-CM | POA: Insufficient documentation

## 2019-02-18 DIAGNOSIS — F17210 Nicotine dependence, cigarettes, uncomplicated: Secondary | ICD-10-CM | POA: Insufficient documentation

## 2019-02-18 DIAGNOSIS — R918 Other nonspecific abnormal finding of lung field: Secondary | ICD-10-CM | POA: Diagnosis not present

## 2019-02-18 DIAGNOSIS — R109 Unspecified abdominal pain: Secondary | ICD-10-CM

## 2019-02-18 DIAGNOSIS — R1011 Right upper quadrant pain: Secondary | ICD-10-CM | POA: Diagnosis not present

## 2019-02-18 HISTORY — DX: Other intervertebral disc displacement, lumbar region: M51.26

## 2019-02-18 HISTORY — DX: Sciatica, unspecified side: M54.30

## 2019-02-18 LAB — URINALYSIS, ROUTINE W REFLEX MICROSCOPIC
Bilirubin Urine: NEGATIVE
Glucose, UA: NEGATIVE mg/dL
Ketones, ur: NEGATIVE mg/dL
Nitrite: NEGATIVE
Protein, ur: NEGATIVE mg/dL
Specific Gravity, Urine: 1.018 (ref 1.005–1.030)
pH: 5 (ref 5.0–8.0)

## 2019-02-18 LAB — CBC WITH DIFFERENTIAL/PLATELET
Abs Immature Granulocytes: 0.03 10*3/uL (ref 0.00–0.07)
Basophils Absolute: 0 10*3/uL (ref 0.0–0.1)
Basophils Relative: 0 %
Eosinophils Absolute: 0.2 10*3/uL (ref 0.0–0.5)
Eosinophils Relative: 2 %
HCT: 38.8 % (ref 36.0–46.0)
Hemoglobin: 12.1 g/dL (ref 12.0–15.0)
Immature Granulocytes: 0 %
Lymphocytes Relative: 29 %
Lymphs Abs: 2.6 10*3/uL (ref 0.7–4.0)
MCH: 30.7 pg (ref 26.0–34.0)
MCHC: 31.2 g/dL (ref 30.0–36.0)
MCV: 98.5 fL (ref 80.0–100.0)
Monocytes Absolute: 0.6 10*3/uL (ref 0.1–1.0)
Monocytes Relative: 7 %
Neutro Abs: 5.6 10*3/uL (ref 1.7–7.7)
Neutrophils Relative %: 62 %
Platelets: 377 10*3/uL (ref 150–400)
RBC: 3.94 MIL/uL (ref 3.87–5.11)
RDW: 14.6 % (ref 11.5–15.5)
WBC: 9.1 10*3/uL (ref 4.0–10.5)
nRBC: 0 % (ref 0.0–0.2)

## 2019-02-18 LAB — COMPREHENSIVE METABOLIC PANEL
ALT: 53 U/L — ABNORMAL HIGH (ref 0–44)
AST: 49 U/L — ABNORMAL HIGH (ref 15–41)
Albumin: 3.6 g/dL (ref 3.5–5.0)
Alkaline Phosphatase: 99 U/L (ref 38–126)
Anion gap: 8 (ref 5–15)
BUN: 8 mg/dL (ref 6–20)
CO2: 28 mmol/L (ref 22–32)
Calcium: 8.7 mg/dL — ABNORMAL LOW (ref 8.9–10.3)
Chloride: 104 mmol/L (ref 98–111)
Creatinine, Ser: 0.92 mg/dL (ref 0.44–1.00)
GFR calc Af Amer: >60 mL/min (ref >60)
GFR calc non Af Amer: >60 mL/min (ref >60)
Glucose, Bld: 121 mg/dL — ABNORMAL HIGH (ref 70–99)
Potassium: 3.9 mmol/L (ref 3.5–5.1)
Sodium: 140 mmol/L (ref 135–145)
Total Bilirubin: 0.4 mg/dL (ref 0.3–1.2)
Total Protein: 7.2 g/dL (ref 6.5–8.1)

## 2019-02-18 LAB — LIPASE, BLOOD: Lipase: 46 U/L (ref 11–51)

## 2019-02-18 MED ORDER — HYDROCODONE-ACETAMINOPHEN 5-325 MG PO TABS
1.0000 | ORAL_TABLET | Freq: Once | ORAL | Status: AC
  Administered 2019-02-18: 1 via ORAL
  Filled 2019-02-18: qty 1

## 2019-02-18 MED ORDER — HYDROMORPHONE HCL 1 MG/ML IJ SOLN
1.0000 mg | Freq: Once | INTRAMUSCULAR | Status: AC
  Administered 2019-02-18: 1 mg via INTRAVENOUS
  Filled 2019-02-18: qty 1

## 2019-02-18 MED ORDER — TRAMADOL HCL 50 MG PO TABS: 50.0000 mg | ORAL_TABLET | Freq: Once | ORAL | Status: DC

## 2019-02-18 MED ORDER — METOCLOPRAMIDE HCL 5 MG/ML IJ SOLN
10.0000 mg | Freq: Once | INTRAMUSCULAR | Status: AC
  Administered 2019-02-18: 10 mg via INTRAVENOUS
  Filled 2019-02-18: qty 2

## 2019-02-18 MED ORDER — CYCLOBENZAPRINE HCL 10 MG PO TABS: 10.0000 mg | ORAL_TABLET | Freq: Two times a day (BID) | ORAL | 0 refills | Status: DC | PRN

## 2019-02-18 NOTE — ED Notes (Signed)
Ultrasound called to make this nurse aware she is in another department and the oncoming day shift ultrasound tech will do this patients [place].

## 2019-02-18 NOTE — ED Notes (Signed)
Patient ambulated to restroom to provide UA with no assistance needed.

## 2019-02-18 NOTE — ED Triage Notes (Signed)
Patient arrives by [facility] with complaints right flank pain. Patient states she was turning in bed and experienced the pain while turning. Slight nausea-no emesis. Pain on palpation and pain increases with deep breath. Denies urinary symptoms.

## 2019-02-18 NOTE — ED Notes (Signed)
This RN encouraged patient to void. Pure wick in place. Repositioned patient.

## 2019-02-18 NOTE — ED Notes (Signed)
Ultrasound at bedside

## 2019-02-18 NOTE — ED Notes (Signed)
Significant other at bedside.

## 2019-02-18 NOTE — ED Provider Notes (Signed)
[facility] DEPT Provider Note   CSN: [id number] Arrival date & time: 02/18/19  [id number]     History   Chief Complaint Chief Complaint  Patient presents with  . Flank Pain    HPI Brandy [name] is a 53 y.o. female.     53 year old female presents to the emergency department for evaluation of right flank pain and right upper quadrant pain.  It began this evening while she was turning in the bed.  Pain is constant and continues to be aggravated with movement as well as deep breathing.  She has not had any improvement to her symptoms, but did not take any pain medication prior to arrival.  Does have chronic back pain with radiculopathy, but states that this pain feels different.  Had nausea with some small amount of emesis prior to arrival.  No fevers, urinary symptoms, bowel changes.  No hx of abdominal surgeries.  The history is provided by the patient. No language interpreter was used.  Flank Pain Associated symptoms include abdominal pain.    Past Medical History:  Diagnosis Date  . Arthritis   . ASD (atrial septal defect)    s/p closure with Amplatzer device 10/05/04 (Dr. Myriam Jacobson, [facility]) 10/05/04  . Cataract   . GERD (gastroesophageal reflux disease)   . Headache   . Heart murmur    no longer heard  . Legally blind in right eye, as defined in [place]   . Lumbar herniated disc   . Sciatica     Patient Active Problem List   Diagnosis Date Noted  . Acute pansinusitis 08/02/2017  . Insomnia 08/02/2017  . Low back pain 08/02/2017  . Migraine 08/02/2017  . Neuropathy 08/02/2017  . 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 Left shoulder tendonitis 05/23/2016    Class: Acute  . Post-op pain 05/23/2016  . Anxiety disorder 01/07/2015    Past Surgical History:  Procedure Laterality Date  . ABLATION    . BREAST SURGERY    . BUNIONECTOMY    . CARDIAC CATHETERIZATION     10/05/04  [facility]): LM < 25%, otherwise normal coronaries. No pulmonary HTN, Mildly enlarged RV. Secundum ASD s/p closure.  Marland Kitchen CARDIAC SURGERY    . CATARACT EXTRACTION    . CLEFT PALATE REPAIR     s/p cleft lip and palate repair  . LUMBAR LAMINECTOMY/DECOMPRESSION MICRODISCECTOMY Left 05/23/2016   Procedure: LEFT L4-L5 LATERAL RECESS DECOMPRESSION WITH CENTRAL AND RIGHT DECOMPRESSION VIA LEFT SIDE;  Surgeon: [name], MD;  Location: [place];  Service: Orthopedics;  Laterality: Left;  . LUMBAR LAMINECTOMY/DECOMPRESSION MICRODISCECTOMY Left 05/23/2016   Procedure: LUMBAR LAMINECTOMY/DECOMPRESSION MICRODISCECTOMY Lumbar five - Sacral One 1 LEVEL;  Surgeon: [name], MD;  Location: [place];  Service: Orthopedics;  Laterality: Left;  . SHOULDER INJECTION Left 05/23/2016   Procedure: SHOULDER INJECTION;  Surgeon: [name], MD;  Location: [place];  Service: Orthopedics;  Laterality: Left;  band-aid per pa-c  . TRANSTHORACIC ECHOCARDIOGRAM     12/15/05 [facility]): Mild LVH, EF > [id number], grade 1 diastolic dysfunction, Trivial MR/PR/TR.     OB History   No obstetric history on file.      Home Medications    Prior to Admission medications   Medication Sig Start Date End Date Taking? Authorizing Provider  amitriptyline (ELAVIL) 75 MG tablet TAKE 1 TABLET (75 MG) BY MOUTH AT BEDTIME 01/27/15   [provider]  baclofen (LIORESAL)  10 MG tablet TK 1 T PO TID PRN 08/04/17   [provider]  gabapentin (NEURONTIN) 300 MG capsule gabapentin 300 mg capsule    [provider]  oxyCODONE (OXY IR/ROXICODONE) 5 MG immediate release tablet TK 1 T PO TID PRN 07/12/17   [provider]  predniSONE (DELTASONE) 50 MG tablet 1 daily with food 05/06/18   Robyn Haber, MD    Family History Family History  Problem Relation Age of Onset  . Diabetes Mother   . High blood pressure Mother   . Cancer Father        Lung  . Colon cancer Neg Hx   . Colon polyps Neg Hx   . Esophageal cancer Neg  Hx   . Rectal cancer Neg Hx   . Stomach cancer Neg Hx     Social History Social History   Tobacco Use  . Smoking status: Current Every Day Smoker    Packs/day: 0.50    Types: Cigarettes  . Smokeless tobacco: Never Used  Substance Use Topics  . Alcohol use: No  . Drug use: No     Allergies   Zithromax [azithromycin] and Psyllium   Review of Systems Review of Systems  Gastrointestinal: Positive for abdominal pain.  Genitourinary: Positive for flank pain.  Ten systems reviewed and are negative for acute change, except as noted in the HPI.    Physical Exam Updated Vital Signs BP (!) 143/80   Pulse 96   Temp 98.3 F (36.8 C) (Oral)   Resp 16   Ht 5\' 7"  (1.702 m)   Wt 99.8 kg   SpO2 99% Comment: Simultaneous filing. User may not have seen previous data.  BMI 34.46 kg/m   Physical Exam Vitals signs and nursing note reviewed.  Constitutional:      General: She is not in acute distress.    Appearance: She is well-developed. She is not diaphoretic.     Comments: Patient in NAD  HENT:     Head: Normocephalic and atraumatic.  Eyes:     General: No scleral icterus.    Conjunctiva/sclera: Conjunctivae normal.  Neck:     Musculoskeletal: Normal range of motion.  Cardiovascular:     Rate and Rhythm: Normal rate and regular rhythm.     Pulses: Normal pulses.  Pulmonary:     Effort: Pulmonary effort is normal. No respiratory distress.     Comments: Respirations even and unlabored Abdominal:     Palpations: Abdomen is soft.     Tenderness: There is abdominal tenderness.     Comments: Abdomen soft, obese with focal RUQ TTP. Negative Murphy's sign. No peritoneal signs.  Musculoskeletal: Normal range of motion.  Skin:    General: Skin is warm and dry.     Coloration: Skin is not pale.     Findings: No erythema or rash.  Neurological:     Mental Status: She is alert and oriented to person, place, and time.     Coordination: Coordination normal.  Psychiatric:         Behavior: Behavior normal.      ED Treatments / Results  Labs (all labs ordered are listed, but only abnormal results are displayed) Labs Reviewed  COMPREHENSIVE METABOLIC PANEL - Abnormal; Notable for the following components:      Result Value   Glucose, Bld 121 (*)    Calcium 8.7 (*)    AST 49 (*)    ALT 53 (*)    All other components within  normal limits  CBC WITH DIFFERENTIAL/PLATELET  LIPASE, BLOOD  URINALYSIS, ROUTINE W REFLEX MICROSCOPIC    EKG None  Radiology No results found.  Procedures Procedures (including critical care time)  Medications Ordered in ED Medications  metoCLOPramide (REGLAN) injection 10 mg (10 mg Intravenous Given 02/18/19 0556)  HYDROmorphone (DILAUDID) injection 1 mg (1 mg Intravenous Given 02/18/19 0558)     Initial Impression / Assessment and Plan / ED Course  I have reviewed the triage vital signs and the nursing notes.  Pertinent labs & imaging results that were available during my care of the patient were reviewed by me and considered in my medical decision making (see chart for details).        53 year old female presents to the emergency department for evaluation of right flank and right upper quadrant abdominal pain.  Noted to have focal tenderness on exam with negative Murphy sign.  Her LFTs are mildly elevated, but with no change to her alkaline phosphatase or total bilirubin.  No fever or leukocytosis.  Pending ultrasound to assess gallbladder.  Urinalysis also ordered to rule out infection.  Kidney stone remains on the differential, but felt less likely.  If no concern for biliary colic, hematuria, or UTI symptoms likely secondary to musculoskeletal etiology.  Patient signed out to [place] Robinson, PA-C at shift change pending remaining work-up.   Final Clinical Impressions(s) / ED Diagnoses   Final diagnoses:  Right upper quadrant abdominal pain    ED Discharge Orders    None       Antonietta Breach, PA-C 02/18/19 [id number]     Veryl Speak, MD 02/18/19 [phone]

## 2019-02-18 NOTE — Discharge Instructions (Signed)
Can take the Flexeril at bedtime as needed for pain.  Continue taking her prescribed pain medications as directed. Call your primary care provider today to discuss your CT findings and need for oncology referral. You have been provided a referral to the [facility]. Return to the emergency department for new or concerning symptoms.

## 2019-02-18 NOTE — ED Provider Notes (Addendum)
Care assumed at shift change from [place], [place], pending RUQ and U/A. See her note for full HPI and workup. Briefly, pt presenting with right flank pain after rolling/twisting in bed.  Pain is reproducible with movement and palpation.  Labs thus far unremarkable.  She had some right upper quadrant tenderness on exam and therefore ultrasound was ordered.  Plan to follow-up ultrasound and urine results.  If UA with blood, consider CT scan to look for stone.  If all negative, recommend discharge with symptomatic management for likely musculoskeletal pain.  Physical Exam  BP 112/66   Pulse (!) 103   Temp 98.3 F (36.8 C) (Oral)   Resp 17   Ht 5\' 7"  ([id number] m)   Wt 99.8 kg   SpO2 100%   BMI 34.46 kg/m   Physical Exam Vitals signs and nursing note reviewed.  Constitutional:      General: She is not in acute distress.    Appearance: She is well-developed.  HENT:     Head: Normocephalic and atraumatic.  Eyes:     Conjunctiva/sclera: Conjunctivae normal.  Cardiovascular:     Rate and Rhythm: Normal rate.  Pulmonary:     Effort: Pulmonary effort is normal.  Abdominal:     Palpations: Abdomen is soft.  Skin:    General: Skin is warm.  Neurological:     Mental Status: She is alert.  Psychiatric:        Behavior: Behavior normal.    Results for orders placed or performed during the hospital encounter of 02/18/19  Urinalysis, Routine w reflex microscopic  Result Value Ref Range   Color, Urine YELLOW YELLOW   APPearance CLOUDY (A) CLEAR   Specific Gravity, Urine 1.018 1.005 - 1.030   pH 5.0 5.0 - 8.0   Glucose, UA NEGATIVE NEGATIVE mg/dL   Hgb urine dipstick SMALL (A) NEGATIVE   Bilirubin Urine NEGATIVE NEGATIVE   Ketones, ur NEGATIVE NEGATIVE mg/dL   Protein, ur NEGATIVE NEGATIVE mg/dL   Nitrite NEGATIVE NEGATIVE   Leukocytes,Ua TRACE (A) NEGATIVE   RBC / HPF 0-5 0 - 5 RBC/hpf   WBC, UA 0-5 0 - 5 WBC/hpf   Bacteria, UA FEW (A) NONE SEEN   Squamous Epithelial / LPF 11-20 0 - 5    Mucus PRESENT   CBC with Differential  Result Value Ref Range   WBC 9.1 4.0 - 10.5 K/uL   RBC 3.94 3.87 - 5.11 MIL/uL   Hemoglobin 12.1 12.0 - 15.0 g/dL   HCT 38.8 36.0 - 46.0 %   MCV 98.5 80.0 - 100.0 fL   MCH 30.7 26.0 - 34.0 pg   MCHC 31.2 30.0 - 36.0 g/dL   RDW 14.6 11.5 - 15.5 %   Platelets 377 150 - 400 K/uL   nRBC 0.0 0.0 - 0.2 %   Neutrophils Relative % 62 %   Neutro Abs 5.6 1.7 - 7.7 K/uL   Lymphocytes Relative 29 %   Lymphs Abs 2.6 0.7 - 4.0 K/uL   Monocytes Relative 7 %   Monocytes Absolute 0.6 0.1 - 1.0 K/uL   Eosinophils Relative 2 %   Eosinophils Absolute 0.2 0.0 - 0.5 K/uL   Basophils Relative 0 %   Basophils Absolute 0.0 0.0 - 0.1 K/uL   Immature Granulocytes 0 %   Abs Immature Granulocytes 0.03 0.00 - 0.07 K/uL  Comprehensive metabolic panel  Result Value Ref Range   Sodium 140 135 - 145 mmol/L   Potassium 3.9 3.5 - 5.1 mmol/L  Chloride 104 98 - 111 mmol/L   CO2 28 22 - 32 mmol/L   Glucose, Bld 121 (H) 70 - 99 mg/dL   BUN 8 6 - 20 mg/dL   Creatinine, Ser 0.92 0.44 - 1.00 mg/dL   Calcium 8.7 (L) 8.9 - 10.3 mg/dL   Total Protein 7.2 6.5 - 8.1 g/dL   Albumin 3.6 3.5 - 5.0 g/dL   AST 49 (H) 15 - 41 U/L   ALT 53 (H) 0 - 44 U/L   Alkaline Phosphatase 99 38 - 126 U/L   Total Bilirubin 0.4 0.3 - 1.2 mg/dL   GFR calc non Af Amer >60 >60 mL/min   GFR calc Af Amer >60 >60 mL/min   Anion gap 8 5 - 15  Lipase, blood  Result Value Ref Range   Lipase 46 11 - 51 U/L   US Abdomen Limited  Result Date: 02/18/2019 CLINICAL DATA:  Acute right upper quadrant abdominal pain. EXAM: ULTRASOUND ABDOMEN LIMITED RIGHT UPPER QUADRANT COMPARISON:  Ultrasound of June 30, 2017. FINDINGS: Gallbladder: No gallstones or wall thickening visualized. No sonographic Murphy sign noted by sonographer. Common bile duct: Diameter: 3 mm which is within normal limits. Liver: No focal lesion identified. Increased echogenicity of hepatic parenchyma is noted. Portal vein is patent on color  Doppler imaging with normal direction of blood flow towards the liver. Other: None. IMPRESSION: Probable hepatic steatosis. No other abnormality seen in the right upper quadrant of the abdomen. Electronically Signed   By: Marijo Conception M.D.   On: 02/18/2019 07:26   Ct Renal Stone Study  Result Date: 02/18/2019 CLINICAL DATA:  Right flank pain and right upper quadrant pain beginning last night. EXAM: CT ABDOMEN AND PELVIS WITHOUT CONTRAST TECHNIQUE: Multidetector CT imaging of the abdomen and pelvis was performed following the standard protocol without IV contrast. COMPARISON:  None. FINDINGS: Lower chest: Multiple small pulmonary nodules are seen in both lung bases, largest measuring 10 mm, consistent with pulmonary metastases. Mild infiltrate or atelectasis also seen in both lung bases. No evidence of pleural effusion. A spiculated soft tissue mass is seen in the inferior right breast measuring 5.0 cm, highly suspicious for primary breast carcinoma. Hepatobiliary: No mass visualized on this unenhanced exam. Gallbladder is unremarkable. No evidence of biliary ductal dilatation. Pancreas: No mass or inflammatory process visualized on this unenhanced exam. Spleen:  Within normal limits in size. Adrenals/Urinary tract: Normal appearance of both adrenal glands. No evidence of urolithiasis or hydronephrosis. Unremarkable unopacified urinary bladder. Stomach/Bowel: No evidence of obstruction, inflammatory process, or abnormal fluid collections. Vascular/Lymphatic: No pathologically enlarged lymph nodes identified. No evidence of abdominal aortic aneurysm. Reproductive: Left-sided uterine fibroid seen measuring 4.5 cm. Adnexal regions are unremarkable. Other:  None. Musculoskeletal:  No suspicious bone lesions identified. IMPRESSION: 1. 5cm spiculated soft tissue mass in inferior right breast, highly suspicious for primary breast carcinoma. 2. No acute findings or metastatic disease within the abdomen or pelvis. 3.  Multiple small pulmonary nodules in both lung bases, consistent with pulmonary metastases. 4. 4.5 cm uterine fibroid. Electronically Signed   By: Marlaine Hind M.D.   On: 02/18/2019 13:43    ED Course/Procedures   Clinical Course as of Feb 17 1425  Mon Feb 18, 2019  1134 UA with small amount of blood.  She states her pain has improved h.  HAd shared decision making with patient regarding CT stone study versus discharge with symptomatic management.  She elects for CT.  Orders placed.   [JR]  1400 Discussed CT findings with oncologist on-call Dr. Irene Limbo to close the loop.  Appreciate consult.  He discussed anticipated work-up eluding diagnostic mammogram and CT imaging.  He recommends patient contact her PCP for referral due to insurance constraints frequently require primary referral.  We will also provide referral from the ED if her insurance allows this referral.   [JR]  1419 Discussed results and oncology consult with patient.  She verbalized understanding and understands the need for close outpatient follow-up.  She is instructed to call her PCP today as well as to discuss with her insurance company their requirements for referrals to oncology.   [JR]    Clinical Course User Index [JR] Robinson, [place] N, PA-C    Procedures  MDM  Work-up is overall unremarkable for acute cause of patient's symptoms, likely musculoskeletal in nature per previous provider.  Will discharge with symptomatic management.   I discussed incidental findings of likely primary breast cancer with metastasis to the lungs with patient as well as with on-call Oncologist.  Appreciate consult.  Patient was instructed of anticipated outpatient work-up per Dr.Kale.  She was instructed to call her PCP for referral though referral provided for insurance allows the referral.  Answered all questions to the best my ability.  Patient is agreeable to plan and safe for discharge.       Robinson, [place] N, PA-C 02/18/19 1428     Robinson, [place] N, [place] 02/18/19 1428    Veryl Speak, MD 02/18/19 [id number]

## 2019-02-18 NOTE — Telephone Encounter (Signed)
I cld Ms. [place] to schedule an appt for her to see Dr. Irene Limbo, but wasn't able to reach her. I lft a vm for her to return my call.

## 2019-02-18 NOTE — ED Notes (Signed)
Patient states she will not be able to get a urine sample until she gets something for pain

## 2019-02-18 NOTE — ED Notes (Signed)
External urinary cath in place due to patient stating he inability to ambulate to move at this time.

## 2019-02-18 NOTE — ED Notes (Signed)
Patient states she awoke this am with right mid back pain with some radiation to back-states pain severe with coughing, deep breath and moving.

## 2019-02-20 ENCOUNTER — Telehealth: Payer: Self-pay | Admitting: Hematology

## 2019-02-20 NOTE — Telephone Encounter (Signed)
I cld and lft Brandy [name] a vm to schedule an appt w/Dr. Irene Limbo

## 2019-02-22 ENCOUNTER — Other Ambulatory Visit: Payer: Self-pay

## 2019-02-22 ENCOUNTER — Encounter: Payer: Self-pay | Admitting: Hematology

## 2019-02-22 ENCOUNTER — Inpatient Hospital Stay: Payer: BC Managed Care – PPO

## 2019-02-22 ENCOUNTER — Telehealth: Payer: Self-pay | Admitting: Hematology

## 2019-02-22 ENCOUNTER — Inpatient Hospital Stay: Payer: BC Managed Care – PPO | Attending: Hematology | Admitting: Hematology

## 2019-02-22 VITALS — BP 129/70 | HR 106 | Temp 98.5°F | Resp 18 | Ht 67.0 in | Wt 223.4 lb

## 2019-02-22 DIAGNOSIS — Z6834 Body mass index (BMI) 34.0-34.9, adult: Secondary | ICD-10-CM | POA: Diagnosis not present

## 2019-02-22 DIAGNOSIS — C50911 Malignant neoplasm of unspecified site of right female breast: Secondary | ICD-10-CM

## 2019-02-22 DIAGNOSIS — R7989 Other specified abnormal findings of blood chemistry: Secondary | ICD-10-CM

## 2019-02-22 DIAGNOSIS — F17210 Nicotine dependence, cigarettes, uncomplicated: Secondary | ICD-10-CM | POA: Diagnosis not present

## 2019-02-22 DIAGNOSIS — Z881 Allergy status to other antibiotic agents status: Secondary | ICD-10-CM

## 2019-02-22 DIAGNOSIS — R600 Localized edema: Secondary | ICD-10-CM

## 2019-02-22 DIAGNOSIS — Z8774 Personal history of (corrected) congenital malformations of heart and circulatory system: Secondary | ICD-10-CM

## 2019-02-22 DIAGNOSIS — R1011 Right upper quadrant pain: Secondary | ICD-10-CM

## 2019-02-22 DIAGNOSIS — R918 Other nonspecific abnormal finding of lung field: Secondary | ICD-10-CM | POA: Diagnosis not present

## 2019-02-22 DIAGNOSIS — N6310 Unspecified lump in the right breast, unspecified quadrant: Secondary | ICD-10-CM | POA: Diagnosis not present

## 2019-02-22 DIAGNOSIS — M25551 Pain in right hip: Secondary | ICD-10-CM | POA: Diagnosis not present

## 2019-02-22 DIAGNOSIS — Z833 Family history of diabetes mellitus: Secondary | ICD-10-CM

## 2019-02-22 DIAGNOSIS — M25552 Pain in left hip: Secondary | ICD-10-CM | POA: Diagnosis not present

## 2019-02-22 DIAGNOSIS — R300 Dysuria: Secondary | ICD-10-CM

## 2019-02-22 DIAGNOSIS — Z8249 Family history of ischemic heart disease and other diseases of the circulatory system: Secondary | ICD-10-CM

## 2019-02-22 DIAGNOSIS — Z801 Family history of malignant neoplasm of trachea, bronchus and lung: Secondary | ICD-10-CM

## 2019-02-22 DIAGNOSIS — R945 Abnormal results of liver function studies: Secondary | ICD-10-CM

## 2019-02-22 DIAGNOSIS — Z79899 Other long term (current) drug therapy: Secondary | ICD-10-CM | POA: Diagnosis not present

## 2019-02-22 DIAGNOSIS — C7800 Secondary malignant neoplasm of unspecified lung: Secondary | ICD-10-CM

## 2019-02-22 LAB — URINALYSIS, COMPLETE (UACMP) WITH MICROSCOPIC
Bacteria, UA: NONE SEEN
Bilirubin Urine: NEGATIVE
Glucose, UA: NEGATIVE mg/dL
Ketones, ur: NEGATIVE mg/dL
Nitrite: NEGATIVE
Protein, ur: NEGATIVE mg/dL
Specific Gravity, Urine: 1.01 (ref 1.005–1.030)
pH: 5 (ref 5.0–8.0)

## 2019-02-22 LAB — CMP (CANCER CENTER ONLY)
ALT: 39 U/L (ref 0–44)
AST: 39 U/L (ref 15–41)
Albumin: 3.9 g/dL (ref 3.5–5.0)
Alkaline Phosphatase: 118 U/L (ref 38–126)
Anion gap: 11 (ref 5–15)
BUN: 8 mg/dL (ref 6–20)
CO2: 28 mmol/L (ref 22–32)
Calcium: 9.5 mg/dL (ref 8.9–10.3)
Chloride: 103 mmol/L (ref 98–111)
Creatinine: 1.06 mg/dL — ABNORMAL HIGH (ref 0.44–1.00)
GFR, Est AFR Am: >60 mL/min (ref >60)
GFR, Estimated: 59 mL/min — ABNORMAL LOW (ref >60)
Glucose, Bld: 91 mg/dL (ref 70–99)
Potassium: 3.9 mmol/L (ref 3.5–5.1)
Sodium: 142 mmol/L (ref 135–145)
Total Bilirubin: 0.4 mg/dL (ref 0.3–1.2)
Total Protein: 7.4 g/dL (ref 6.5–8.1)

## 2019-02-22 NOTE — Progress Notes (Signed)
HEMATOLOGY/ONCOLOGY CONSULTATION NOTE  Date of Service: 02/22/2019  Patient Care Team: [facility], [facility] as PCP - General Burr Medico  CHIEF COMPLAINTS/PURPOSE OF CONSULTATION:  Suspected breast cancer  HISTORY OF PRESENTING ILLNESS:   Chirsty [name] is a wonderful 53 y.o. female who has been referred to [place] by [facility], [facility] for evaluation and management of her suspected breast cancer.   The pt reports that she was having increasing low back pain on her right side that would not resolve, so she went to the ED. This pain has not resolved. She notes pain in her hips and knees.   Of note prior to the patient's visit today, pt has had an abdominal limited right upper ultrasound completed on 02/18/2019 with results revealing "Probable hepatic steatosis. No other abnormality seen in the right upper quadrant of the abdomen."  Pt had abdomen and pelvis CT completed on 02/18/2019 with results revealing "5cm spiculated soft tissue mass in inferior right breast, highly suspicious for primary breast carcinoma. No acute findings or metastatic disease within the abdomen or pelvis. Multiple small pulmonary nodules in both lung bases, consistent with pulmonary metastases. 4.5 cm uterine fibroid."  Most recent lab results (02/18/2019) of CBC is as follows: all values are WNL except for glucose at 121, calcium at 8.7, AST at 49, and ALT at 53.  On review of systems, pt reports pedal edema and denies chest pain, shortness of breath, weight loss, and any other symptoms.  Her last known mammography was on 06/18/2012.   On Social Hx the pt reports that she is a smoker and she smokes about half a pack per day. She does not drink alcohol. She has three children: age 30, 50, and 70.  On Family Hx the pt reports lung cancer. Maternal Cousin passed from stomach cancer at the age of 16.     MEDICAL HISTORY:  Past Medical History:  Diagnosis Date  . Arthritis   . ASD (atrial  septal defect)    s/p closure with Amplatzer device 10/05/04 (Dr. Myriam Jacobson, [facility]) 10/05/04  . Cataract   . GERD (gastroesophageal reflux disease)   . Headache   . Heart murmur    no longer heard  . Legally blind in right eye, as defined in [place]   . Lumbar herniated disc   . Sciatica   Uterine Fibroids  SURGICAL HISTORY: Past Surgical History:  Procedure Laterality Date  . ABLATION    . BREAST SURGERY    . BUNIONECTOMY    . CARDIAC CATHETERIZATION     10/05/04 [facility]): LM < 25%, otherwise normal coronaries. No pulmonary HTN, Mildly enlarged RV. Secundum ASD s/p closure.  Marland Kitchen CARDIAC SURGERY    . CATARACT EXTRACTION    . CLEFT PALATE REPAIR     s/p cleft lip and palate repair  . LUMBAR LAMINECTOMY/DECOMPRESSION MICRODISCECTOMY Left 05/23/2016   Procedure: LEFT L4-L5 LATERAL RECESS DECOMPRESSION WITH CENTRAL AND RIGHT DECOMPRESSION VIA LEFT SIDE;  Surgeon: [name], MD;  Location: [place];  Service: Orthopedics;  Laterality: Left;  . LUMBAR LAMINECTOMY/DECOMPRESSION MICRODISCECTOMY Left 05/23/2016   Procedure: LUMBAR LAMINECTOMY/DECOMPRESSION MICRODISCECTOMY Lumbar five - Sacral One 1 LEVEL;  Surgeon: [name], MD;  Location: [place];  Service: Orthopedics;  Laterality: Left;  . SHOULDER INJECTION Left 05/23/2016   Procedure: SHOULDER INJECTION;  Surgeon: [name], MD;  Location: [place];  Service: Orthopedics;  Laterality: Left;  band-aid per pa-c  . TRANSTHORACIC ECHOCARDIOGRAM  12/15/05 [facility]): Mild LVH, EF > [id number], grade 1 diastolic dysfunction, Trivial MR/PR/TR.  Endometrial ablation 2003 Breast Reduction, bilateral 2011  SOCIAL HISTORY: Social History   Socioeconomic History  . Marital status: Married    Spouse name: Not on file  . Number of children: 3  . Years of education: Not on file  . Highest education level: Not on file  Occupational History  . Not on file  Social Needs  . Financial resource strain: Not on file  . Food insecurity    Worry: Not on  file    Inability: Not on file  . Transportation needs    Medical: Not on file    Non-medical: Not on file  Tobacco Use  . Smoking status: Current Every Day Smoker    Packs/day: 0.50    Types: Cigarettes  . Smokeless tobacco: Never Used  Substance and Sexual Activity  . Alcohol use: No  . Drug use: No  . Sexual activity: Not on file  Lifestyle  . Physical activity    Days per week: Not on file    Minutes per session: Not on file  . Stress: Not on file  Relationships  . Social Herbalist on phone: Not on file    Gets together: Not on file    Attends religious service: Not on file    Active member of club or organization: Not on file    Attends meetings of clubs or organizations: Not on file    Relationship status: Not on file  . Intimate partner violence    Fear of current or ex partner: Not on file    Emotionally abused: Not on file    Physically abused: Not on file    Forced sexual activity: Not on file  Other Topics Concern  . Not on file  Social History Narrative  . Not on file    FAMILY HISTORY: Family History  Problem Relation Age of Onset  . Diabetes Mother   . High blood pressure Mother   . Cancer Father        Lung  . Colon cancer Neg Hx   . Colon polyps Neg Hx   . Esophageal cancer Neg Hx   . Rectal cancer Neg Hx   . Stomach cancer Neg Hx     ALLERGIES:  is allergic to zithromax [azithromycin] and psyllium.  MEDICATIONS:  Current Outpatient Medications  Medication Sig Dispense Refill  . ALPRAZolam (XANAX) 1 MG tablet Take 1 mg by mouth daily.    Marland Kitchen amitriptyline (ELAVIL) 75 MG tablet Take 75 mg by mouth at bedtime.   6  . cyclobenzaprine (FLEXERIL) 10 MG tablet Take 1 tablet (10 mg total) by mouth 2 (two) times daily as needed for muscle spasms. 14 tablet 0  . HYDROcodone-Acetaminophen 7.5-300 MG TABS Take 1 tablet by mouth 3 (three) times daily as needed for pain.    Marland Kitchen PARoxetine (PAXIL) 10 MG tablet Take 10 mg by mouth daily.      Current Facility-Administered Medications  Medication Dose Route Frequency Provider Last Rate Last Dose  . 0.9 %  sodium chloride infusion  500 mL Intravenous Continuous Nandigam, Venia Minks, MD        REVIEW OF SYSTEMS:    10 Point review of Systems was done is negative except as noted above.  PHYSICAL EXAMINATION: ECOG PERFORMANCE STATUS: 1 - Symptomatic but completely ambulatory  Vitals:   02/22/19 1310  BP: 129/70  Pulse: (!) 106  Resp:  18  Temp: 98.5 F (36.9 C)  SpO2: 98%   Filed Weights   02/22/19 1310  Weight: 223 lb 6.4 oz (101.3 kg)   Body mass index is 34.99 kg/m.  GENERAL:alert, in no acute distress and comfortable SKIN: no acute rashes, no significant lesions EYES: conjunctiva are pink and non-injected, sclera anicteric OROPHARYNX: MMM, no exudates, no oropharyngeal erythema or ulceration NECK: supple, no JVD LYMPH:  no palpable lymphadenopathy in the cervical or inguinal regions. 2 palpable lymph nodes in the left axilla.  LUNGS: clear to auscultation b/l with normal respiratory effort HEART: regular rate & rhythm ABDOMEN:  normoactive bowel sounds, not distended. Abdominal tenderness in the LLQ Extremity: no pedal edema PSYCH: alert & oriented x 3 with fluent speech NEURO: no focal motor/sensory deficits BREAST: right breast tenderness to palpation in the LOQ. 4-5cm right breast mass inferior aspect of the breast  LABORATORY DATA:  I have reviewed the data as listed  . CBC Latest Ref Rng & Units 02/18/2019 12/29/2016 05/24/2016  WBC 4.0 - 10.5 K/uL 9.1 7.2 15.6(H)  Hemoglobin 12.0 - 15.0 g/dL 12.1 13.2 11.4(L)  Hematocrit 36.0 - 46.0 % 38.8 40.6 34.9(L)  Platelets 150 - 400 K/uL 377 348 303    . CMP Latest Ref Rng & Units 02/22/2019 02/18/2019 05/24/2016  Glucose 70 - 99 mg/dL 91 121(H) 119(H)  BUN 6 - 20 mg/dL 8 8 12   Creatinine 0.44 - 1.00 mg/dL 1.06(H) 0.92 0.98  Sodium 135 - 145 mmol/L 142 140 139  Potassium 3.5 - 5.1 mmol/L 3.9 3.9 4.5   Chloride 98 - 111 mmol/L 103 104 103  CO2 22 - 32 mmol/L 28 28 28   Calcium 8.9 - 10.3 mg/dL 9.5 8.7(L) 8.8(L)  Total Protein 6.5 - 8.1 g/dL 7.4 7.2 -  Total Bilirubin 0.3 - 1.2 mg/dL 0.4 0.4 -  Alkaline Phos 38 - 126 U/L 118 99 -  AST 15 - 41 U/L 39 49(H) -  ALT 0 - 44 U/L 39 53(H) -     RADIOGRAPHIC STUDIES: I have personally reviewed the radiological images as listed and agreed with the findings in the report. US Abdomen Limited  Result Date: 02/18/2019 CLINICAL DATA:  Acute right upper quadrant abdominal pain. EXAM: ULTRASOUND ABDOMEN LIMITED RIGHT UPPER QUADRANT COMPARISON:  Ultrasound of June 30, 2017. FINDINGS: Gallbladder: No gallstones or wall thickening visualized. No sonographic Murphy sign noted by sonographer. Common bile duct: Diameter: 3 mm which is within normal limits. Liver: No focal lesion identified. Increased echogenicity of hepatic parenchyma is noted. Portal vein is patent on color Doppler imaging with normal direction of blood flow towards the liver. Other: None. IMPRESSION: Probable hepatic steatosis. No other abnormality seen in the right upper quadrant of the abdomen. Electronically Signed   By: Marijo Conception M.D.   On: 02/18/2019 07:26   Ct Renal Stone Study  Result Date: 02/18/2019 CLINICAL DATA:  Right flank pain and right upper quadrant pain beginning last night. EXAM: CT ABDOMEN AND PELVIS WITHOUT CONTRAST TECHNIQUE: Multidetector CT imaging of the abdomen and pelvis was performed following the standard protocol without IV contrast. COMPARISON:  None. FINDINGS: Lower chest: Multiple small pulmonary nodules are seen in both lung bases, largest measuring 10 mm, consistent with pulmonary metastases. Mild infiltrate or atelectasis also seen in both lung bases. No evidence of pleural effusion. A spiculated soft tissue mass is seen in the inferior right breast measuring 5.0 cm, highly suspicious for primary breast carcinoma. Hepatobiliary: No mass visualized on this  unenhanced exam. Gallbladder is unremarkable. No evidence of biliary ductal dilatation. Pancreas: No mass or inflammatory process visualized on this unenhanced exam. Spleen:  Within normal limits in size. Adrenals/Urinary tract: Normal appearance of both adrenal glands. No evidence of urolithiasis or hydronephrosis. Unremarkable unopacified urinary bladder. Stomach/Bowel: No evidence of obstruction, inflammatory process, or abnormal fluid collections. Vascular/Lymphatic: No pathologically enlarged lymph nodes identified. No evidence of abdominal aortic aneurysm. Reproductive: Left-sided uterine fibroid seen measuring 4.5 cm. Adnexal regions are unremarkable. Other:  None. Musculoskeletal:  No suspicious bone lesions identified. IMPRESSION: 1. 5cm spiculated soft tissue mass in inferior right breast, highly suspicious for primary breast carcinoma. 2. No acute findings or metastatic disease within the abdomen or pelvis. 3. Multiple small pulmonary nodules in both lung bases, consistent with pulmonary metastases. 4. 4.5 cm uterine fibroid. Electronically Signed   By: Marlaine Hind M.D.   On: 02/18/2019 13:43    ASSESSMENT & PLAN:  Brandy [name] is a 53 y.o. female with:  1. Suspected metastatic breast cancer 2. Likely Pulmonary metastases PLAN: -Discussed patient's most recent labs from 02/18/2019, all values are WNL except for glucose at 121, calcium at 8.7, AST at 49, and ALT at 53. -discussed her recent CT renal protocol and findings noted on that. - Order for C/A/P CT, Mammography with Ultrasound and Biopsy, Bone Scan, and Brain MRI to further diagnose  FOLLOW UP: -Labs today -CT neck/chest/abd/pelvis w/ contrast in 3-4 days -Bone scan in 3-4 days -MRI brain in 3-4 days -Mammogram and [place] axillary ASAP in 3-4 days -RTC with Dr Irene Limbo in 2 weeks   All of the patients questions were answered with apparent satisfaction. The patient knows to call the clinic with any problems, questions or concerns.   I spent 40 minutes counseling the patient face to face. The total time spent in the appointment was 60 minutes and more than 50% was on counseling and direct patient cares.    Sullivan Lone MD MS AAHIVMS [facility] Hematology/Oncology Physician [facility]  (Office):       [phone] (Work cell):  [phone] (Fax):           [phone]  02/22/2019 12:43 PM  I, Jacqualyn Posey, am acting as a Education administrator for Dr. Sullivan Lone.   .I have reviewed the above documentation for accuracy and completeness, and I agree with the above. Brunetta Genera MD

## 2019-02-22 NOTE — Telephone Encounter (Signed)
Gave avs and calendar also contrast

## 2019-02-23 LAB — CANCER ANTIGEN 15-3: CA 15-3: 25.9 U/mL — ABNORMAL HIGH (ref 0.0–25.0)

## 2019-02-23 LAB — CANCER ANTIGEN 27.29: CA 27.29: 42.6 U/mL — ABNORMAL HIGH (ref 0.0–38.6)

## 2019-02-23 LAB — URINE CULTURE: Culture: <10000 — AB

## 2019-02-26 LAB — VITAMIN D 25 HYDROXY (VIT D DEFICIENCY, FRACTURES): Vit D, 25-Hydroxy: 23.4 ng/mL — ABNORMAL LOW (ref 30.0–100.0)

## 2019-02-28 ENCOUNTER — Encounter (HOSPITAL_COMMUNITY): Payer: Self-pay

## 2019-02-28 ENCOUNTER — Ambulatory Visit (HOSPITAL_COMMUNITY)
Admission: RE | Admit: 2019-02-28 | Discharge: 2019-02-28 | Disposition: A | Discharge status: Home / Self Care | Payer: BC Managed Care – PPO | Source: Ambulatory Visit | Attending: Hematology | Admitting: Hematology

## 2019-02-28 ENCOUNTER — Other Ambulatory Visit: Payer: Self-pay

## 2019-02-28 DIAGNOSIS — C50911 Malignant neoplasm of unspecified site of right female breast: Secondary | ICD-10-CM | POA: Diagnosis not present

## 2019-02-28 DIAGNOSIS — C7800 Secondary malignant neoplasm of unspecified lung: Secondary | ICD-10-CM | POA: Insufficient documentation

## 2019-02-28 DIAGNOSIS — R945 Abnormal results of liver function studies: Secondary | ICD-10-CM | POA: Insufficient documentation

## 2019-02-28 DIAGNOSIS — R7989 Other specified abnormal findings of blood chemistry: Secondary | ICD-10-CM

## 2019-02-28 MED ORDER — IOHEXOL 300 MG/ML  SOLN
100.0000 mL | Freq: Once | INTRAMUSCULAR | Status: AC | PRN
  Administered 2019-02-28: 100 mL via INTRAVENOUS

## 2019-02-28 MED ORDER — SODIUM CHLORIDE (PF) 0.9 % IJ SOLN
INTRAMUSCULAR | Status: AC
  Filled 2019-02-28: qty 50

## 2019-03-04 ENCOUNTER — Other Ambulatory Visit: Payer: Self-pay

## 2019-03-04 ENCOUNTER — Ambulatory Visit (HOSPITAL_COMMUNITY)
Admission: RE | Admit: 2019-03-04 | Discharge: 2019-03-04 | Disposition: A | Discharge status: Home / Self Care | Payer: BC Managed Care – PPO | Source: Ambulatory Visit | Attending: Hematology | Admitting: Hematology

## 2019-03-04 DIAGNOSIS — C50911 Malignant neoplasm of unspecified site of right female breast: Secondary | ICD-10-CM | POA: Insufficient documentation

## 2019-03-04 DIAGNOSIS — C7800 Secondary malignant neoplasm of unspecified lung: Secondary | ICD-10-CM | POA: Insufficient documentation

## 2019-03-04 MED ORDER — GADOBUTROL 1 MMOL/ML IV SOLN
10.0000 mL | Freq: Once | INTRAVENOUS | Status: AC | PRN
  Administered 2019-03-04: 09:00:00 10 mL via INTRAVENOUS

## 2019-03-07 ENCOUNTER — Other Ambulatory Visit: Payer: Self-pay | Admitting: Radiology

## 2019-03-07 NOTE — Progress Notes (Signed)
HEMATOLOGY/ONCOLOGY CLINIC NOTE  Date of Service: 03/07/2019  Patient Care Team: [facility] as PCP - General Everardo Beals, NP as Nurse Practitioner [facility]  CHIEF COMPLAINTS/PURPOSE OF CONSULTATION:  Suspected breast cancer   HISTORY OF PRESENTING ILLNESS:  Brandy [name] is a wonderful 53 y.o. female who has been referred to [place] by [facility] for evaluation and management of her suspected breast cancer.   The pt reports that she was having increasing low back pain on her right side that would not resolve, so she went to the ED. This pain has not resolved. She notes pain in her hips and knees.   Of note prior to the patient's visit today, pt has had an abdominal limited right upper ultrasound completed on 02/18/2019 with results revealing "Probable hepatic steatosis. No other abnormality seen in the right upper quadrant of the abdomen."  Pt had abdomen and pelvis CT completed on 02/18/2019 with results revealing "5cm spiculated soft tissue mass in inferior right breast, highly suspicious for primary breast carcinoma. No acute findings or metastatic disease within the abdomen or pelvis. Multiple small pulmonary nodules in both lung bases, consistent with pulmonary metastases. 4.5 cm uterine fibroid."  Most recent lab results (02/18/2019) of CBC is as follows: all values are WNL except for glucose at 121, calcium at 8.7, AST at 49, and ALT at 53.  On review of systems, pt reports pedal edema and denies chest pain, shortness of breath, weight loss, and any other symptoms.  Her last known mammography was on 06/18/2012.   On Social Hx the pt reports that she is a smoker and she smokes about half a pack per day. She does not drink alcohol. She has three children: age 86, 14, and 28.  On Family Hx the pt reports lung cancer. Maternal Cousin passed from stomach cancer at the age of 52.    INTERVAL HISTORY: Brandy [name] is a 53 y.o.  female here for evaluation and management of likely metastatic breast cancer. The patient's last visit with [place] was on 02/22/2019. The pt reports that she is doing well overall.  The pt reports she had the biopsyyesterday --results not available at this time  Of note since the patient's last visit, pt has had neck CT completed on 02/28/2019 with results revealing "negative for mass or adenopathy in the neck."  Pt had C/A/P CT completed on 02/28/2019 with results revealing "Irregular solid 5.0 cm right breast mass, suspicious for primary right breast malignancy. Innumerable solid pulmonary nodules scattered throughout both lungs, compatible with pulmonary metastases. No evidence of metastatic disease in the abdomen, pelvis or skeleton. Mildly enlarged and probably myomatous uterus. Simple 1.4 cm left adnexal cyst requires no follow-up. This recommendation follows ACR consensus guidelines: White Paper of the ACR Incidental Findings Committee II on Adnexal Findings. J Am Coll Radiol [phone]. Aortic Atherosclerosis (ICD10-I70.0)."  Pt had head MRI completed on 03/04/2019 with results revealing "Negative for metastatic disease.  No acute abnormality in the brain."   Lab results today (01/23/19) of CBC w/diff and CMP reviewed   MEDICAL HISTORY:  Past Medical History:  Diagnosis Date   Arthritis    ASD (atrial septal defect)    s/p closure with Amplatzer device 10/05/04 (Dr. Myriam Jacobson, [facility]) 10/05/04   Cataract    GERD (gastroesophageal reflux disease)    Headache    Heart murmur    no longer heard   Legally blind in right eye, as defined  in [place]    Lumbar herniated disc    Sciatica   Uterine Fibroids  SURGICAL HISTORY: Past Surgical History:  Procedure Laterality Date   ABLATION     BREAST SURGERY Bilateral 2011   Breast Reduction Surgery   BUNIONECTOMY     CARDIAC CATHETERIZATION     10/05/04 [facility]): LM < 25%, otherwise normal coronaries. No pulmonary HTN,  Mildly enlarged RV. Secundum ASD s/p closure.   CARDIAC SURGERY     CATARACT EXTRACTION     CLEFT PALATE REPAIR     s/p cleft lip and palate repair   LUMBAR LAMINECTOMY/DECOMPRESSION MICRODISCECTOMY Left 05/23/2016   Procedure: LEFT L4-L5 LATERAL RECESS DECOMPRESSION WITH CENTRAL AND RIGHT DECOMPRESSION VIA LEFT SIDE;  Surgeon: [name], MD;  Location: [place];  Service: Orthopedics;  Laterality: Left;   LUMBAR LAMINECTOMY/DECOMPRESSION MICRODISCECTOMY Left 05/23/2016   Procedure: LUMBAR LAMINECTOMY/DECOMPRESSION MICRODISCECTOMY Lumbar five - Sacral One 1 LEVEL;  Surgeon: [name], MD;  Location: [place];  Service: Orthopedics;  Laterality: Left;   SHOULDER INJECTION Left 05/23/2016   Procedure: SHOULDER INJECTION;  Surgeon: [name], MD;  Location: [place];  Service: Orthopedics;  Laterality: Left;  band-aid per pa-c   TRANSTHORACIC ECHOCARDIOGRAM     12/15/05 [facility]): Mild LVH, EF > [id number], grade 1 diastolic dysfunction, Trivial MR/PR/TR.  Endometrial ablation 2003 Breast Reduction, bilateral 2011  SOCIAL HISTORY: Social History   Socioeconomic History   Marital status: Married    Spouse name: Not on file   Number of children: 3   Years of education: Not on file   Highest education level: Not on file  Occupational History   Not on file  Social Needs   Financial resource strain: Not on file   Food insecurity    Worry: Not on file    Inability: Not on file   Transportation needs    Medical: Not on file    Non-medical: Not on file  Tobacco Use   Smoking status: Current Every Day Smoker    Packs/day: 0.50    Types: Cigarettes   Smokeless tobacco: Never Used  Substance and Sexual Activity   Alcohol use: No   Drug use: No   Sexual activity: Not on file  Lifestyle   Physical activity    Days per week: Not on file    Minutes per session: Not on file   Stress: Not on file  Relationships   Social connections    Talks on phone: Not on file    Gets  together: Not on file    Attends religious service: Not on file    Active member of club or organization: Not on file    Attends meetings of clubs or organizations: Not on file    Relationship status: Not on file   Intimate partner violence    Fear of current or ex partner: Not on file    Emotionally abused: Not on file    Physically abused: Not on file    Forced sexual activity: Not on file  Other Topics Concern   Not on file  Social History Narrative   Not on file    FAMILY HISTORY: Family History  Problem Relation Age of Onset   Diabetes Mother    High blood pressure Mother    Cancer Father        Lung   Colon cancer Neg Hx    Colon polyps Neg Hx    Esophageal cancer Neg Hx    Rectal  cancer Neg Hx    Stomach cancer Neg Hx     ALLERGIES:  is allergic to zithromax [azithromycin] and psyllium.  MEDICATIONS:  Current Outpatient Medications  Medication Sig Dispense Refill   ALPRAZolam (XANAX) 1 MG tablet Take 1 mg by mouth daily.     amitriptyline (ELAVIL) 75 MG tablet Take 75 mg by mouth at bedtime.   6   cyclobenzaprine (FLEXERIL) 10 MG tablet Take 1 tablet (10 mg total) by mouth 2 (two) times daily as needed for muscle spasms. 14 tablet 0   HYDROcodone-Acetaminophen 7.5-300 MG TABS Take 1 tablet by mouth 3 (three) times daily as needed for pain.     PARoxetine (PAXIL) 10 MG tablet Take 10 mg by mouth daily.     Current Facility-Administered Medications  Medication Dose Route Frequency Provider Last Rate Last Dose   0.9 %  sodium chloride infusion  500 mL Intravenous Continuous Nandigam, Venia Minks, MD        REVIEW OF SYSTEMS:   A 10+ POINT REVIEW OF SYSTEMS WAS OBTAINED including neurology, dermatology, psychiatry, cardiac, respiratory, lymph, extremities, GI, GU, Musculoskeletal, constitutional, breasts, reproductive, HEENT.  All pertinent positives are noted in the HPI.  All others are negative.    PHYSICAL EXAMINATION: ECOG FS:2 - Symptomatic,  <50% confined to bed  There were no vitals filed for this visit. Wt Readings from Last 3 Encounters:  02/22/19 223 lb 6.4 oz (101.3 kg)  02/18/19 220 lb (99.8 kg)  08/15/17 204 lb (92.5 kg)   There is no height or weight on file to calculate BMI.    GENERAL:alert, in no acute distress and comfortable SKIN: no acute rashes, no significant lesions EYES: conjunctiva are pink and non-injected, sclera anicteric OROPHARYNX: MMM, no exudates, no oropharyngeal erythema or ulceration NECK: supple, no JVD LYMPH:  no palpable lymphadenopathy in the cervical, axillary or inguinal regions LUNGS: clear to auscultation b/l with normal respiratory effort HEART: regular rate & rhythm ABDOMEN:  normoactive bowel sounds , non tender, not distended. Extremity: no pedal edema PSYCH: alert & oriented x 3 with fluent speech NEURO: no focal motor/sensory deficits   LABORATORY DATA:  I have reviewed the data as listed  . CBC Latest Ref Rng & Units 02/18/2019 12/29/2016 05/24/2016  WBC 4.0 - 10.5 K/uL 9.1 7.2 15.6(H)  Hemoglobin 12.0 - 15.0 g/dL 12.1 13.2 11.4(L)  Hematocrit 36.0 - 46.0 % 38.8 40.6 34.9(L)  Platelets 150 - 400 K/uL 377 348 303    . CMP Latest Ref Rng & Units 02/22/2019 02/18/2019 05/24/2016  Glucose 70 - 99 mg/dL 91 121(H) 119(H)  BUN 6 - 20 mg/dL 8 8 12   Creatinine 0.44 - 1.00 mg/dL 1.06(H) 0.92 0.98  Sodium 135 - 145 mmol/L 142 140 139  Potassium 3.5 - 5.1 mmol/L 3.9 3.9 4.5  Chloride 98 - 111 mmol/L 103 104 103  CO2 22 - 32 mmol/L 28 28 28   Calcium 8.9 - 10.3 mg/dL 9.5 8.7(L) 8.8(L)  Total Protein 6.5 - 8.1 g/dL 7.4 7.2 -  Total Bilirubin 0.3 - 1.2 mg/dL 0.4 0.4 -  Alkaline Phos 38 - 126 U/L 118 99 -  AST 15 - 41 U/L 39 49(H) -  ALT 0 - 44 U/L 39 53(H) -     RADIOGRAPHIC STUDIES: I have personally reviewed the radiological images as listed and agreed with the findings in the report. Ct Soft Tissue Neck W Contrast  Result Date: 02/28/2019 CLINICAL DATA:  Stage IV breast  cancer.  Staging. EXAM:  CT NECK WITH CONTRAST TECHNIQUE: Multidetector CT imaging of the neck was performed using the standard protocol following the bolus administration of intravenous contrast. CONTRAST:  132mL OMNIPAQUE IOHEXOL 300 MG/ML  SOLN COMPARISON:  None. FINDINGS: Pharynx and larynx: Negative for pharyngeal mass. Airway intact. Prominent fat at the base of the epiglottis in the pre epiglottic space with fatty density. Vocal cords normal. Salivary glands: No inflammation, mass, or stone. Thyroid: Negative Lymph nodes: No enlarged or pathologic lymph nodes in the neck. Vascular: Normal vascular enhancement. Limited intracranial: Negative Visualized orbits: Right eye prosthesis. Left ocular surgery and lens replacement. Mastoids and visualized paranasal sinuses: Mild mucosal edema left maxillary sinus. Skeleton: Cervical spondylosis.  No acute skeletal abnormality. Upper chest: Reported separately Other: Extensive dermal calcification IMPRESSION: Negative for mass or adenopathy in the neck. Electronically Signed   By: Franchot Gallo M.D.   On: 02/28/2019 12:55   Ct Chest W Contrast  Result Date: 02/28/2019 CLINICAL DATA:  Clinical stage IV right breast cancer. Initial staging. EXAM: CT CHEST, ABDOMEN, AND PELVIS WITH CONTRAST TECHNIQUE: Multidetector CT imaging of the chest, abdomen and pelvis was performed following the standard protocol during bolus administration of intravenous contrast. CONTRAST:  158mL OMNIPAQUE IOHEXOL 300 MG/ML  SOLN COMPARISON:  02/18/2019 unenhanced CT abdomen/pelvis. FINDINGS: CT CHEST FINDINGS Cardiovascular: Normal heart size. No significant pericardial effusion/thickening. Atrial septal closure device in place. Atherosclerotic nonaneurysmal thoracic aorta. Top-normal caliber main pulmonary artery (3.0 cm diameter). No central pulmonary emboli. Mediastinum/Nodes: No discrete thyroid nodules. Unremarkable esophagus. No pathologically enlarged axillary, mediastinal or hilar  lymph nodes. Lungs/Pleura: No pneumothorax. No pleural effusion. No acute consolidative airspace disease or lung masses. There are innumerable (> 30) solid pulmonary nodules scattered throughout both lungs involving all lung lobes. Representative apical right upper lobe 1.6 cm (series 5/image 20), paramediastinal 1.4 cm right upper lobe (series 5/image 39) and left lower lobe 0.9 cm (series 5/image 74) solid pulmonary nodules. Musculoskeletal: No aggressive appearing focal osseous lesions. Mild thoracic spondylosis. Irregular solid 5.0 x 3.8 cm right breast mass (series 3/image 26). CT ABDOMEN PELVIS FINDINGS Hepatobiliary: Normal liver with no liver mass. Normal gallbladder with no radiopaque cholelithiasis. No biliary ductal dilatation. Pancreas: Normal, with no mass or duct dilation. Spleen: Normal size. No mass. Adrenals/Urinary Tract: Normal adrenals. Normal kidneys with no hydronephrosis and no renal mass. Normal bladder. Stomach/Bowel: Normal non-distended stomach. Normal caliber small bowel with no small bowel wall thickening. Normal appendix. Normal large bowel with no diverticulosis, large bowel wall thickening or pericolonic fat stranding. Vascular/Lymphatic: Atherosclerotic nonaneurysmal abdominal aorta. Patent portal, splenic, hepatic and renal veins. No pathologically enlarged lymph nodes in the abdomen or pelvis. Reproductive: Mildly enlarged uterus with probable 4.4 cm left uterine body fibroid. Simple 1.4 cm left adnexal cyst (series 3/image 104). No additional adnexal lesions. Other: No pneumoperitoneum, ascites or focal fluid collection. Musculoskeletal: No aggressive appearing focal osseous lesions. Moderate lumbar spondylosis. IMPRESSION: 1. Irregular solid 5.0 cm right breast mass, suspicious for primary right breast malignancy. 2. Innumerable solid pulmonary nodules scattered throughout both lungs, compatible with pulmonary metastases. 3. No evidence of metastatic disease in the abdomen,  pelvis or skeleton. 4. Mildly enlarged and probably myomatous uterus. Simple 1.4 cm left adnexal cyst requires no follow-up. This recommendation follows ACR consensus guidelines: White Paper of the ACR Incidental Findings Committee II on Adnexal Findings. J Am Coll Radiol 2013:10:675-681. 5.  Aortic Atherosclerosis (ICD10-I70.0). Electronically Signed   By: Ilona Sorrel M.D.   On: 02/28/2019 09:27   Mr Brain  W Wo Contrast  Result Date: 03/04/2019 CLINICAL DATA:  Breast cancer staging for metastatic disease. EXAM: MRI HEAD WITHOUT AND WITH CONTRAST TECHNIQUE: Multiplanar, multiecho pulse sequences of the brain and surrounding structures were obtained without and with intravenous contrast. CONTRAST:  55mL GADAVIST GADOBUTROL 1 MMOL/ML IV SOLN COMPARISON:  CT head 06/04/2009 FINDINGS: Brain: Ventricle size and cerebral volume normal. Negative for acute infarct, hemorrhage, mass. No edema No enhancing lesions postcontrast infusion. Vascular: Normal arterial flow voids. Skull and upper cervical spine: No focal skull or bony lesion. Sinuses/Orbits: Right eye prosthesis.  No orbital mass. Mild mucosal edema left maxillary sinus. Other: None IMPRESSION: Negative for metastatic disease.  No acute abnormality in the brain. Electronically Signed   By: Franchot Gallo M.D.   On: 03/04/2019 11:28   Ct Abdomen Pelvis W Contrast  Result Date: 02/28/2019 CLINICAL DATA:  Clinical stage IV right breast cancer. Initial staging. EXAM: CT CHEST, ABDOMEN, AND PELVIS WITH CONTRAST TECHNIQUE: Multidetector CT imaging of the chest, abdomen and pelvis was performed following the standard protocol during bolus administration of intravenous contrast. CONTRAST:  16mL OMNIPAQUE IOHEXOL 300 MG/ML  SOLN COMPARISON:  02/18/2019 unenhanced CT abdomen/pelvis. FINDINGS: CT CHEST FINDINGS Cardiovascular: Normal heart size. No significant pericardial effusion/thickening. Atrial septal closure device in place. Atherosclerotic nonaneurysmal  thoracic aorta. Top-normal caliber main pulmonary artery (3.0 cm diameter). No central pulmonary emboli. Mediastinum/Nodes: No discrete thyroid nodules. Unremarkable esophagus. No pathologically enlarged axillary, mediastinal or hilar lymph nodes. Lungs/Pleura: No pneumothorax. No pleural effusion. No acute consolidative airspace disease or lung masses. There are innumerable (> 30) solid pulmonary nodules scattered throughout both lungs involving all lung lobes. Representative apical right upper lobe 1.6 cm (series 5/image 20), paramediastinal 1.4 cm right upper lobe (series 5/image 39) and left lower lobe 0.9 cm (series 5/image 74) solid pulmonary nodules. Musculoskeletal: No aggressive appearing focal osseous lesions. Mild thoracic spondylosis. Irregular solid 5.0 x 3.8 cm right breast mass (series 3/image 26). CT ABDOMEN PELVIS FINDINGS Hepatobiliary: Normal liver with no liver mass. Normal gallbladder with no radiopaque cholelithiasis. No biliary ductal dilatation. Pancreas: Normal, with no mass or duct dilation. Spleen: Normal size. No mass. Adrenals/Urinary Tract: Normal adrenals. Normal kidneys with no hydronephrosis and no renal mass. Normal bladder. Stomach/Bowel: Normal non-distended stomach. Normal caliber small bowel with no small bowel wall thickening. Normal appendix. Normal large bowel with no diverticulosis, large bowel wall thickening or pericolonic fat stranding. Vascular/Lymphatic: Atherosclerotic nonaneurysmal abdominal aorta. Patent portal, splenic, hepatic and renal veins. No pathologically enlarged lymph nodes in the abdomen or pelvis. Reproductive: Mildly enlarged uterus with probable 4.4 cm left uterine body fibroid. Simple 1.4 cm left adnexal cyst (series 3/image 104). No additional adnexal lesions. Other: No pneumoperitoneum, ascites or focal fluid collection. Musculoskeletal: No aggressive appearing focal osseous lesions. Moderate lumbar spondylosis. IMPRESSION: 1. Irregular solid 5.0 cm  right breast mass, suspicious for primary right breast malignancy. 2. Innumerable solid pulmonary nodules scattered throughout both lungs, compatible with pulmonary metastases. 3. No evidence of metastatic disease in the abdomen, pelvis or skeleton. 4. Mildly enlarged and probably myomatous uterus. Simple 1.4 cm left adnexal cyst requires no follow-up. This recommendation follows ACR consensus guidelines: White Paper of the ACR Incidental Findings Committee II on Adnexal Findings. J Am Coll Radiol 2013:10:675-681. 5.  Aortic Atherosclerosis (ICD10-I70.0). Electronically Signed   By: Ilona Sorrel M.D.   On: 02/28/2019 09:27   US Abdomen Limited  Result Date: 02/18/2019 CLINICAL DATA:  Acute right upper quadrant abdominal pain. EXAM: ULTRASOUND ABDOMEN LIMITED  RIGHT UPPER QUADRANT COMPARISON:  Ultrasound of June 30, 2017. FINDINGS: Gallbladder: No gallstones or wall thickening visualized. No sonographic Murphy sign noted by sonographer. Common bile duct: Diameter: 3 mm which is within normal limits. Liver: No focal lesion identified. Increased echogenicity of hepatic parenchyma is noted. Portal vein is patent on color Doppler imaging with normal direction of blood flow towards the liver. Other: None. IMPRESSION: Probable hepatic steatosis. No other abnormality seen in the right upper quadrant of the abdomen. Electronically Signed   By: Marijo Conception M.D.   On: 02/18/2019 07:26   Ct Renal Stone Study  Result Date: 02/18/2019 CLINICAL DATA:  Right flank pain and right upper quadrant pain beginning last night. EXAM: CT ABDOMEN AND PELVIS WITHOUT CONTRAST TECHNIQUE: Multidetector CT imaging of the abdomen and pelvis was performed following the standard protocol without IV contrast. COMPARISON:  None. FINDINGS: Lower chest: Multiple small pulmonary nodules are seen in both lung bases, largest measuring 10 mm, consistent with pulmonary metastases. Mild infiltrate or atelectasis also seen in both lung bases. No  evidence of pleural effusion. A spiculated soft tissue mass is seen in the inferior right breast measuring 5.0 cm, highly suspicious for primary breast carcinoma. Hepatobiliary: No mass visualized on this unenhanced exam. Gallbladder is unremarkable. No evidence of biliary ductal dilatation. Pancreas: No mass or inflammatory process visualized on this unenhanced exam. Spleen:  Within normal limits in size. Adrenals/Urinary tract: Normal appearance of both adrenal glands. No evidence of urolithiasis or hydronephrosis. Unremarkable unopacified urinary bladder. Stomach/Bowel: No evidence of obstruction, inflammatory process, or abnormal fluid collections. Vascular/Lymphatic: No pathologically enlarged lymph nodes identified. No evidence of abdominal aortic aneurysm. Reproductive: Left-sided uterine fibroid seen measuring 4.5 cm. Adnexal regions are unremarkable. Other:  None. Musculoskeletal:  No suspicious bone lesions identified. IMPRESSION: 1. 5cm spiculated soft tissue mass in inferior right breast, highly suspicious for primary breast carcinoma. 2. No acute findings or metastatic disease within the abdomen or pelvis. 3. Multiple small pulmonary nodules in both lung bases, consistent with pulmonary metastases. 4. 4.5 cm uterine fibroid. Electronically Signed   By: Marlaine Hind M.D.   On: 02/18/2019 13:43    ASSESSMENT & PLAN:   Brandy [name] is a 53 y.o. female with:  1. Suspected metastatic breast cancer  02/28/2019 neck CTwith results revealing "negative for mass or adenopathy in the neck."  03/04/2019 head MRI with results revealing "Negative for metastatic disease.  No acute abnormality in the brain."  2. Likely Pulmonary metastases  02/18/2019 chest and abdomen with results revealing "5cm spiculated soft tissue mass in inferior right breast, highly suspicious for primary breast carcinoma. No acute findings or metastatic disease within the abdomen or pelvis. Multiple small pulmonary nodules  in both lung bases, consistent with pulmonary metastases. 4.5 cm uterine fibroid."  02/28/2019 C/A/P CT with results revealing "Irregular solid 5.0 cm right breast mass, suspicious for primary right breast malignancy. Innumerable solid pulmonary nodules scattered throughout both lungs, compatible with pulmonary metastases. No evidence of metastatic disease in the abdomen, pelvis or skeleton. Mildly enlarged and probably myomatous uterus. Simple 1.4 cm left adnexal cyst requires no follow-up. This recommendation follows ACR consensus guidelines: White Paper of the ACR Incidental Findings Committee II on Adnexal Findings. J Am Coll Radiol [phone]. Aortic Atherosclerosis (ICD10-I70.0)."   PLAN: -Discussed pt labwork today, 01/23/19; -Discussed 02/28/2019 neck CTwith results revealing "negative for mass or adenopathy in the neck." -Discussed 02/28/2019 C/A/P CT with results revealing "Irregular solid 5.0 cm right breast mass, suspicious for  primary right breast malignancy. Innumerable solid pulmonary nodules scattered throughout both lungs, compatible with pulmonary metastases. No evidence of metastatic disease in the abdomen, pelvis or skeleton. Mildly enlarged and probably myomatous uterus. Simple 1.4 cm left adnexal cyst requires no follow-up. This recommendation follows ACR consensus guidelines: White Paper of the ACR Incidental Findings Committee II on Adnexal Findings. J Am Coll Radiol [phone]. Aortic Atherosclerosis (ICD10-I70.0)." -Discussed 03/04/2019 head MRI with results revealing "Negative for metastatic disease.  No acute abnormality in the brain."  -patient has had a breast mass Biopsy yesterday -- results will take 3-5 days to become available.   FOLLOW UP: Labs today Whole body bone scan in 3-4 days  RTC with Dr Irene Limbo with labs in 1 week  The total time spent in the appt was 25 minutes and more than 50% was on counseling and direct patient cares.  All of the patient's  questions were answered with apparent satisfaction. The patient knows to call the clinic with any problems, questions or concerns.     Sullivan Lone MD MS AAHIVMS [facility] Hematology/Oncology Physician [facility]  (Office):       [phone] (Work cell):  [phone] (Fax):           [phone]  03/07/2019 9:47 PM  I, Jacqualyn Posey, am acting as a Education administrator for Dr. Sullivan Lone.   .I have reviewed the above documentation for accuracy and completeness, and I agree with the above. Brunetta Genera MD

## 2019-03-08 ENCOUNTER — Telehealth: Payer: Self-pay | Admitting: Hematology

## 2019-03-08 ENCOUNTER — Other Ambulatory Visit: Payer: Self-pay

## 2019-03-08 ENCOUNTER — Inpatient Hospital Stay: Payer: BC Managed Care – PPO | Attending: Hematology | Admitting: Hematology

## 2019-03-08 ENCOUNTER — Telehealth: Payer: Self-pay | Admitting: *Deleted

## 2019-03-08 ENCOUNTER — Inpatient Hospital Stay: Payer: BC Managed Care – PPO

## 2019-03-08 VITALS — BP 190/101 | HR 103 | Temp 98.5°F | Resp 19 | Ht 67.0 in | Wt 228.8 lb

## 2019-03-08 DIAGNOSIS — R1011 Right upper quadrant pain: Secondary | ICD-10-CM | POA: Insufficient documentation

## 2019-03-08 DIAGNOSIS — Z8774 Personal history of (corrected) congenital malformations of heart and circulatory system: Secondary | ICD-10-CM | POA: Insufficient documentation

## 2019-03-08 DIAGNOSIS — I700 Atherosclerosis of aorta: Secondary | ICD-10-CM | POA: Insufficient documentation

## 2019-03-08 DIAGNOSIS — M25551 Pain in right hip: Secondary | ICD-10-CM | POA: Insufficient documentation

## 2019-03-08 DIAGNOSIS — F17210 Nicotine dependence, cigarettes, uncomplicated: Secondary | ICD-10-CM | POA: Insufficient documentation

## 2019-03-08 DIAGNOSIS — D259 Leiomyoma of uterus, unspecified: Secondary | ICD-10-CM | POA: Diagnosis not present

## 2019-03-08 DIAGNOSIS — C7800 Secondary malignant neoplasm of unspecified lung: Secondary | ICD-10-CM

## 2019-03-08 DIAGNOSIS — C50911 Malignant neoplasm of unspecified site of right female breast: Secondary | ICD-10-CM

## 2019-03-08 DIAGNOSIS — Z881 Allergy status to other antibiotic agents status: Secondary | ICD-10-CM | POA: Diagnosis not present

## 2019-03-08 DIAGNOSIS — C7951 Secondary malignant neoplasm of bone: Secondary | ICD-10-CM | POA: Diagnosis present

## 2019-03-08 DIAGNOSIS — Z833 Family history of diabetes mellitus: Secondary | ICD-10-CM | POA: Diagnosis not present

## 2019-03-08 DIAGNOSIS — Z801 Family history of malignant neoplasm of trachea, bronchus and lung: Secondary | ICD-10-CM | POA: Insufficient documentation

## 2019-03-08 DIAGNOSIS — Z79899 Other long term (current) drug therapy: Secondary | ICD-10-CM | POA: Insufficient documentation

## 2019-03-08 DIAGNOSIS — M25552 Pain in left hip: Secondary | ICD-10-CM | POA: Diagnosis not present

## 2019-03-08 DIAGNOSIS — R918 Other nonspecific abnormal finding of lung field: Secondary | ICD-10-CM | POA: Diagnosis present

## 2019-03-08 DIAGNOSIS — M47814 Spondylosis without myelopathy or radiculopathy, thoracic region: Secondary | ICD-10-CM | POA: Diagnosis not present

## 2019-03-08 DIAGNOSIS — N852 Hypertrophy of uterus: Secondary | ICD-10-CM | POA: Insufficient documentation

## 2019-03-08 DIAGNOSIS — C50511 Malignant neoplasm of lower-outer quadrant of right female breast: Secondary | ICD-10-CM | POA: Insufficient documentation

## 2019-03-08 DIAGNOSIS — Z8249 Family history of ischemic heart disease and other diseases of the circulatory system: Secondary | ICD-10-CM | POA: Diagnosis not present

## 2019-03-08 DIAGNOSIS — R600 Localized edema: Secondary | ICD-10-CM | POA: Insufficient documentation

## 2019-03-08 MED ORDER — ERGOCALCIFEROL 1.25 MG (50000 UT) PO CAPS: 50000.0000 [IU] | ORAL_CAPSULE | ORAL | 3 refills | Status: DC

## 2019-03-08 MED ORDER — HYDROCODONE-ACETAMINOPHEN 7.5-300 MG PO TABS: 1.0000 | ORAL_TABLET | Freq: Three times a day (TID) | ORAL | 0 refills | Status: DC | PRN

## 2019-03-08 NOTE — Telephone Encounter (Signed)
Scheduled per 10/02 los, patient received after visit summary and checked patient in for labs.

## 2019-03-08 NOTE — Telephone Encounter (Signed)
Signed orders for breast biopsy requested  - faxed to [facility] [phone]. Fax confirmation received

## 2019-03-09 LAB — FOLLICLE STIMULATING HORMONE: FSH: 65.5 m[IU]/mL

## 2019-03-09 LAB — LUTEINIZING HORMONE: LH: 46.6 m[IU]/mL

## 2019-03-09 LAB — PROGESTERONE: Progesterone: <0.1 ng/mL

## 2019-03-13 LAB — ESTROGENS, TOTAL: Estrogen: 82 pg/mL

## 2019-03-15 ENCOUNTER — Ambulatory Visit: Payer: BC Managed Care – PPO | Admitting: Hematology

## 2019-03-18 ENCOUNTER — Telehealth: Payer: Self-pay | Admitting: Hematology

## 2019-03-18 ENCOUNTER — Encounter: Payer: Self-pay | Admitting: Hematology

## 2019-03-18 ENCOUNTER — Inpatient Hospital Stay: Payer: BC Managed Care – PPO | Admitting: Hematology

## 2019-03-18 NOTE — Telephone Encounter (Signed)
Returned patient's phone call regarding rescheduling 10/12 missed appointment, per patient's request appointment has moved to 10/16.

## 2019-03-19 ENCOUNTER — Encounter
Admission: RE | Admit: 2019-03-19 | Discharge: 2019-03-19 | Disposition: A | Discharge status: Home / Self Care | Payer: BC Managed Care – PPO | Source: Ambulatory Visit | Attending: Hematology | Admitting: Hematology

## 2019-03-19 ENCOUNTER — Other Ambulatory Visit: Payer: Self-pay

## 2019-03-19 DIAGNOSIS — C7800 Secondary malignant neoplasm of unspecified lung: Secondary | ICD-10-CM | POA: Insufficient documentation

## 2019-03-19 DIAGNOSIS — C50911 Malignant neoplasm of unspecified site of right female breast: Secondary | ICD-10-CM

## 2019-03-19 MED ORDER — TECHNETIUM TC 99M MEDRONATE IV KIT
22.1400 | PACK | Freq: Once | INTRAVENOUS | Status: AC | PRN
  Administered 2019-03-19: 10:00:00 22.14 via INTRAVENOUS

## 2019-03-21 NOTE — Progress Notes (Signed)
HEMATOLOGY/ONCOLOGY CLINIC NOTE  Date of Service: 03/22/2019  Patient Care Team: [facility] as PCP - General Everardo Beals, NP as Nurse Practitioner [facility]  CHIEF COMPLAINTS/PURPOSE OF CONSULTATION:  mx of newly diagnosed metastatic breast cancer   HISTORY OF PRESENTING ILLNESS:  Brandy [name] is a wonderful 53 y.o. female who has been referred to [place] by [facility] for evaluation and management of her suspected breast cancer.   The pt reports that she was having increasing low back pain on her right side that would not resolve, so she went to the ED. This pain has not resolved. She notes pain in her hips and knees.   Of note prior to the patient's visit today, pt has had an abdominal limited right upper ultrasound completed on 02/18/2019 with results revealing "Probable hepatic steatosis. No other abnormality seen in the right upper quadrant of the abdomen."  Pt had abdomen and pelvis CT completed on 02/18/2019 with results revealing "5cm spiculated soft tissue mass in inferior right breast, highly suspicious for primary breast carcinoma. No acute findings or metastatic disease within the abdomen or pelvis. Multiple small pulmonary nodules in both lung bases, consistent with pulmonary metastases. 4.5 cm uterine fibroid."  Most recent lab results (02/18/2019) of CBC is as follows: all values are WNL except for glucose at 121, calcium at 8.7, AST at 49, and ALT at 53.  On review of systems, pt reports pedal edema and denies chest pain, shortness of breath, weight loss, and any other symptoms.  Her last known mammography was on 06/18/2012.   On Social Hx the pt reports that she is a smoker and she smokes about half a pack per day. She does not drink alcohol. She has three children: age 24, 66, and 59.  On Family Hx the pt reports lung cancer. Maternal Cousin passed from stomach cancer at the age of 62.    INTERVAL HISTORY:   Brandy [name] is a 53 y.o. female here for evaluation and management of likely metastatic breast cancer. The patient's last visit with [place] was on 03/08/2019. The pt reports that she is doing well overall. The pt is on the phone with her mother Ivor Costa and sister Gabriel Cirri.   The pt reports that she is post menopausal, with last period was over 17 years ago. She has not been on any hormonal therapy.  She is taking Vitamin D  She uses the [employer] on [address] results today (02/22/2019) of CBC w/diff and CMP is as follows: all values are WNL except for Creatinine at 1.06, GFR, Est Non Af Am at 58 02/22/2019 CA 27.29 at 42.6.  Of note since the patient's last visit, pt has had Ultrasound guided biopsy with clip placement of mass in the right breast lower outer quadrant  completed on 03/07/2019 with results revealing "Ultrasound guided biopsy of the 7 mm mass in the right breast at 7 o'clock posterior depth 5.5 cm from nipple was successful with no apparent post procedure complications.   Of note since the patient's last visit, pt has had [place] completed on 03/19/2019 with results revealing "1. No scintigraphic evidence skeletal metastasis. 2. 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   On review of systems, pt reports burning sensation in right breast around areola   and denies back pain, abdominal pain and any other symptoms.    MEDICAL HISTORY:  Past Medical History:  Diagnosis Date  . Arthritis   . ASD (atrial septal defect)    s/p closure with Amplatzer device 10/05/04 (Dr. Myriam Jacobson, [facility]) 10/05/04  . Cataract   . GERD (gastroesophageal reflux disease)   . Headache   . Heart murmur    no longer heard  . Legally blind in right eye, as defined in [place]   . Lumbar herniated disc   . Sciatica   Uterine Fibroids  SURGICAL HISTORY: Past Surgical History:  Procedure Laterality Date   . ABLATION    . BREAST SURGERY Bilateral 2011   Breast Reduction Surgery  . BUNIONECTOMY    . CARDIAC CATHETERIZATION     10/05/04 [facility]): LM < 25%, otherwise normal coronaries. No pulmonary HTN, Mildly enlarged RV. Secundum ASD s/p closure.  Marland Kitchen CARDIAC SURGERY    . CATARACT EXTRACTION    . CLEFT PALATE REPAIR     s/p cleft lip and palate repair  . LUMBAR LAMINECTOMY/DECOMPRESSION MICRODISCECTOMY Left 05/23/2016   Procedure: LEFT L4-L5 LATERAL RECESS DECOMPRESSION WITH CENTRAL AND RIGHT DECOMPRESSION VIA LEFT SIDE;  Surgeon: [name], MD;  Location: [place];  Service: Orthopedics;  Laterality: Left;  . LUMBAR LAMINECTOMY/DECOMPRESSION MICRODISCECTOMY Left 05/23/2016   Procedure: LUMBAR LAMINECTOMY/DECOMPRESSION MICRODISCECTOMY Lumbar five - Sacral One 1 LEVEL;  Surgeon: [name], MD;  Location: [place];  Service: Orthopedics;  Laterality: Left;  . SHOULDER INJECTION Left 05/23/2016   Procedure: SHOULDER INJECTION;  Surgeon: [name], MD;  Location: [place];  Service: Orthopedics;  Laterality: Left;  band-aid per pa-c  . TRANSTHORACIC ECHOCARDIOGRAM     12/15/05 [facility]): Mild LVH, EF > 59%, grade 1 diastolic dysfunction, Trivial MR/PR/TR.  Endometrial ablation 2003 Breast Reduction, bilateral 2011  SOCIAL HISTORY: Social History   Socioeconomic History  . Marital status: Married    Spouse name: Not on file  . Number of children: 3  . Years of education: Not on file  . Highest education level: Not on file  Occupational History  . Not on file  Social Needs  . Financial resource strain: Not on file  . Food insecurity    Worry: Not on file    Inability: Not on file  . Transportation needs    Medical: Not on file    Non-medical: Not on file  Tobacco Use  . Smoking status: Current Every Day Smoker    Packs/day: 0.50    Types: Cigarettes  . Smokeless tobacco: Never Used  Substance and Sexual Activity  . Alcohol use: No  . Drug use: No  . Sexual activity: Not on file   Lifestyle  . Physical activity    Days per week: Not on file    Minutes per session: Not on file  . Stress: Not on file  Relationships  . Social Herbalist on phone: Not on file    Gets together: Not on file    Attends religious service: Not on file    Active member of club or organization: Not on file    Attends meetings of clubs or organizations: Not on file    Relationship status: Not on file  . Intimate partner violence    Fear of current or ex partner: Not on file    Emotionally abused: Not on file    Physically abused: Not on file    Forced sexual activity: Not on file  Other Topics Concern  . Not on file  Social History Narrative  .  Not on file    FAMILY HISTORY: Family History  Problem Relation Age of Onset  . Diabetes Mother   . High blood pressure Mother   . Cancer Father        Lung  . Colon cancer Neg Hx   . Colon polyps Neg Hx   . Esophageal cancer Neg Hx   . Rectal cancer Neg Hx   . Stomach cancer Neg Hx     ALLERGIES:  is allergic to zithromax [azithromycin] and psyllium.  MEDICATIONS:  Current Outpatient Medications  Medication Sig Dispense Refill  . ALPRAZolam (XANAX) 1 MG tablet Take 1 mg by mouth daily.    Marland Kitchen amitriptyline (ELAVIL) 75 MG tablet Take 75 mg by mouth at bedtime.   6  . cyclobenzaprine (FLEXERIL) 10 MG tablet Take 1 tablet (10 mg total) by mouth 2 (two) times daily as needed for muscle spasms. 14 tablet 0  . ergocalciferol (VITAMIN D2) 1.25 MG (50000 UT) capsule Take 1 capsule (50,000 Units total) by mouth once a week. 12 capsule 3  . HYDROcodone-Acetaminophen 7.5-300 MG TABS Take 1 tablet by mouth 3 (three) times daily as needed. 60 tablet 0  . PARoxetine (PAXIL) 10 MG tablet Take 10 mg by mouth daily.     Current Facility-Administered Medications  Medication Dose Route Frequency Provider Last Rate Last Dose  . 0.9 %  sodium chloride infusion  500 mL Intravenous Continuous Nandigam, Venia Minks, MD        REVIEW OF  SYSTEMS:   A 10+ POINT REVIEW OF SYSTEMS WAS OBTAINED including neurology, dermatology, psychiatry, cardiac, respiratory, lymph, extremities, GI, GU, Musculoskeletal, constitutional, breasts, reproductive, HEENT.  All pertinent positives are noted in the HPI.  All others are negative.    PHYSICAL EXAMINATION: ECOG FS:1 - Symptomatic but completely ambulatory  There were no vitals filed for this visit. Wt Readings from Last 3 Encounters:  03/08/19 228 lb 12.8 oz (103.8 kg)  02/22/19 223 lb 6.4 oz (101.3 kg)  02/18/19 220 lb (99.8 kg)   There is no height or weight on file to calculate BMI.    GENERAL:alert, in no acute distress and comfortable SKIN: no acute rashes, no significant lesions EYES: conjunctiva are pink and non-injected, sclera anicteric OROPHARYNX: MMM, no exudates, no oropharyngeal erythema or ulceration NECK: supple, no JVD LYMPH:  no palpable lymphadenopathy in the cervical, axillary or inguinal regions LUNGS: clear to auscultation b/l with normal respiratory effort HEART: regular rate & rhythm ABDOMEN:  normoactive bowel sounds , non tender, not distended. Extremity: no pedal edema PSYCH: alert & oriented x 3 with fluent speech NEURO: no focal motor/sensory deficits    LABORATORY DATA:  I have reviewed the data as listed  . CBC Latest Ref Rng & Units 02/18/2019 12/29/2016 05/24/2016  WBC 4.0 - 10.5 K/uL 9.1 7.2 15.6(H)  Hemoglobin 12.0 - 15.0 g/dL 12.1 13.2 11.4(L)  Hematocrit 36.0 - 46.0 % 38.8 40.6 34.9(L)  Platelets 150 - 400 K/uL 377 348 303    . CMP Latest Ref Rng & Units 02/22/2019 02/18/2019 05/24/2016  Glucose 70 - 99 mg/dL 91 121(H) 119(H)  BUN 6 - 20 mg/dL _0 Creatinine 0.44 - 1.00 mg/dL 1.06(H) 0.92 0.98  Sodium 135 - 145 mmol/L 142 140 139  Potassium 3.5 - 5.1 mmol/L 3.9 3.9 4.5  Chloride 98 - 111 mmol/L 103 104 103  CO2 22 - 32 mmol/L _1 Calcium 8.9 - 10.3 mg/dL 9.5 8.7(L) 8.8(L)  Total Protein  6.5 - 8.1 g/dL 7.4 7.2 -  Total  Bilirubin 0.3 - 1.2 mg/dL 0.4 0.4 -  Alkaline Phos 38 - 126 U/L 118 99 -  AST 15 - 41 U/L 39 49(H) -  ALT 0 - 44 U/L 39 53(H) -   Component     Latest Ref Rng & Units 03/08/2019 03/22/2019  FSH     mIU/mL 65.5   LH     mIU/mL 46.6   Estrogen     pg/mL 82   Progesterone     ng/mL <0.1   Estradiol     pg/mL  <5.0      RADIOGRAPHIC STUDIES: I have personally reviewed the radiological images as listed and agreed with the findings in the report. Ct Soft Tissue Neck W Contrast  Result Date: 02/28/2019 CLINICAL DATA:  Stage IV breast cancer.  Staging. EXAM: CT NECK WITH CONTRAST TECHNIQUE: Multidetector CT imaging of the neck was performed using the standard protocol following the bolus administration of intravenous contrast. CONTRAST:  13m OMNIPAQUE IOHEXOL 300 MG/ML  SOLN COMPARISON:  None. FINDINGS: Pharynx and larynx: Negative for pharyngeal mass. Airway intact. Prominent fat at the base of the epiglottis in the pre epiglottic space with fatty density. Vocal cords normal. Salivary glands: No inflammation, mass, or stone. Thyroid: Negative Lymph nodes: No enlarged or pathologic lymph nodes in the neck. Vascular: Normal vascular enhancement. Limited intracranial: Negative Visualized orbits: Right eye prosthesis. Left ocular surgery and lens replacement. Mastoids and visualized paranasal sinuses: Mild mucosal edema left maxillary sinus. Skeleton: Cervical spondylosis.  No acute skeletal abnormality. Upper chest: Reported separately Other: Extensive dermal calcification IMPRESSION: Negative for mass or adenopathy in the neck. Electronically Signed   By: CFranchot GalloM.D.   On: 02/28/2019 12:55   Ct Chest W Contrast  Result Date: 02/28/2019 CLINICAL DATA:  Clinical stage IV right breast cancer. Initial staging. EXAM: CT CHEST, ABDOMEN, AND PELVIS WITH CONTRAST TECHNIQUE: Multidetector CT imaging of the chest, abdomen and pelvis was performed following the standard protocol during bolus  administration of intravenous contrast. CONTRAST:  1079mOMNIPAQUE IOHEXOL 300 MG/ML  SOLN COMPARISON:  02/18/2019 unenhanced CT abdomen/pelvis. FINDINGS: CT CHEST FINDINGS Cardiovascular: Normal heart size. No significant pericardial effusion/thickening. Atrial septal closure device in place. Atherosclerotic nonaneurysmal thoracic aorta. Top-normal caliber main pulmonary artery (3.0 cm diameter). No central pulmonary emboli. Mediastinum/Nodes: No discrete thyroid nodules. Unremarkable esophagus. No pathologically enlarged axillary, mediastinal or hilar lymph nodes. Lungs/Pleura: No pneumothorax. No pleural effusion. No acute consolidative airspace disease or lung masses. There are innumerable (> 30) solid pulmonary nodules scattered throughout both lungs involving all lung lobes. Representative apical right upper lobe 1.6 cm (series 5/image 20), paramediastinal 1.4 cm right upper lobe (series 5/image 39) and left lower lobe 0.9 cm (series 5/image 74) solid pulmonary nodules. Musculoskeletal: No aggressive appearing focal osseous lesions. Mild thoracic spondylosis. Irregular solid 5.0 x 3.8 cm right breast mass (series 3/image 26). CT ABDOMEN PELVIS FINDINGS Hepatobiliary: Normal liver with no liver mass. Normal gallbladder with no radiopaque cholelithiasis. No biliary ductal dilatation. Pancreas: Normal, with no mass or duct dilation. Spleen: Normal size. No mass. Adrenals/Urinary Tract: Normal adrenals. Normal kidneys with no hydronephrosis and no renal mass. Normal bladder. Stomach/Bowel: Normal non-distended stomach. Normal caliber small bowel with no small bowel wall thickening. Normal appendix. Normal large bowel with no diverticulosis, large bowel wall thickening or pericolonic fat stranding. Vascular/Lymphatic: Atherosclerotic nonaneurysmal abdominal aorta. Patent portal, splenic, hepatic and renal veins. No pathologically enlarged lymph nodes in the  abdomen or pelvis. Reproductive: Mildly enlarged uterus  with probable 4.4 cm left uterine body fibroid. Simple 1.4 cm left adnexal cyst (series 3/image 104). No additional adnexal lesions. Other: No pneumoperitoneum, ascites or focal fluid collection. Musculoskeletal: No aggressive appearing focal osseous lesions. Moderate lumbar spondylosis. IMPRESSION: 1. Irregular solid 5.0 cm right breast mass, suspicious for primary right breast malignancy. 2. Innumerable solid pulmonary nodules scattered throughout both lungs, compatible with pulmonary metastases. 3. No evidence of metastatic disease in the abdomen, pelvis or skeleton. 4. Mildly enlarged and probably myomatous uterus. Simple 1.4 cm left adnexal cyst requires no follow-up. This recommendation follows ACR consensus guidelines: White Paper of the ACR Incidental Findings Committee II on Adnexal Findings. J Am Coll Radiol 2013:10:675-681. 5.  Aortic Atherosclerosis (ICD10-I70.0). Electronically Signed   By: Ilona Sorrel M.D.   On: 02/28/2019 09:27   Mr Jeri Cos DH Contrast  Result Date: 03/04/2019 CLINICAL DATA:  Breast cancer staging for metastatic disease. EXAM: MRI HEAD WITHOUT AND WITH CONTRAST TECHNIQUE: Multiplanar, multiecho pulse sequences of the brain and surrounding structures were obtained without and with intravenous contrast. CONTRAST:  20m GADAVIST GADOBUTROL 1 MMOL/ML IV SOLN COMPARISON:  CT head 06/04/2009 FINDINGS: Brain: Ventricle size and cerebral volume normal. Negative for acute infarct, hemorrhage, mass. No edema No enhancing lesions postcontrast infusion. Vascular: Normal arterial flow voids. Skull and upper cervical spine: No focal skull or bony lesion. Sinuses/Orbits: Right eye prosthesis.  No orbital mass. Mild mucosal edema left maxillary sinus. Other: None IMPRESSION: Negative for metastatic disease.  No acute abnormality in the brain. Electronically Signed   By: CFranchot GalloM.D.   On: 03/04/2019 11:28   Nm Bone Scan Whole Body  Result Date: 03/20/2019 CLINICAL DATA:  Breast  cancer, initial staging, question bone metastasis. EXAM: NUCLEAR MEDICINE WHOLE BODY BONE SCAN TECHNIQUE: Whole body anterior and posterior images were obtained approximately 3 hours after intravenous injection of radiopharmaceutical. RADIOPHARMACEUTICALS:  22.4 mCi Technetium-937mDP IV COMPARISON:  CT 02/28/2019 FINDINGS: Fairly intense uptake posteriorly in the lumbar spine from L2-L4. This corresponds to a bony bridging over the posterior elements/facet joints on comparison CT. Findings consistent with degenerative bone disease. No additional foci of uptake to suggest skeletal metastasis. IMPRESSION: 1. No scintigraphic evidence skeletal metastasis. 2. 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 Electronically Signed   By: StSuzy Bouchard.D.   On: 03/20/2019 08:40   Ct Abdomen Pelvis W Contrast  Result Date: 02/28/2019 CLINICAL DATA:  Clinical stage IV right breast cancer. Initial staging. EXAM: CT CHEST, ABDOMEN, AND PELVIS WITH CONTRAST TECHNIQUE: Multidetector CT imaging of the chest, abdomen and pelvis was performed following the standard protocol during bolus administration of intravenous contrast. CONTRAST:  10037mMNIPAQUE IOHEXOL 300 MG/ML  SOLN COMPARISON:  02/18/2019 unenhanced CT abdomen/pelvis. FINDINGS: CT CHEST FINDINGS Cardiovascular: Normal heart size. No significant pericardial effusion/thickening. Atrial septal closure device in place. Atherosclerotic nonaneurysmal thoracic aorta. Top-normal caliber main pulmonary artery (3.0 cm diameter). No central pulmonary emboli. Mediastinum/Nodes: No discrete thyroid nodules. Unremarkable esophagus. No pathologically enlarged axillary, mediastinal or hilar lymph nodes. Lungs/Pleura: No pneumothorax. No pleural effusion. No acute consolidative airspace disease or lung masses. There are innumerable (> 30) solid pulmonary nodules scattered throughout both lungs involving all lung lobes. Representative apical  right upper lobe 1.6 cm (series 5/image 20), paramediastinal 1.4 cm right upper lobe (series 5/image 39) and left lower lobe 0.9 cm (series 5/image 74) solid pulmonary nodules. Musculoskeletal: No aggressive appearing focal  osseous lesions. Mild thoracic spondylosis. Irregular solid 5.0 x 3.8 cm right breast mass (series 3/image 26). CT ABDOMEN PELVIS FINDINGS Hepatobiliary: Normal liver with no liver mass. Normal gallbladder with no radiopaque cholelithiasis. No biliary ductal dilatation. Pancreas: Normal, with no mass or duct dilation. Spleen: Normal size. No mass. Adrenals/Urinary Tract: Normal adrenals. Normal kidneys with no hydronephrosis and no renal mass. Normal bladder. Stomach/Bowel: Normal non-distended stomach. Normal caliber small bowel with no small bowel wall thickening. Normal appendix. Normal large bowel with no diverticulosis, large bowel wall thickening or pericolonic fat stranding. Vascular/Lymphatic: Atherosclerotic nonaneurysmal abdominal aorta. Patent portal, splenic, hepatic and renal veins. No pathologically enlarged lymph nodes in the abdomen or pelvis. Reproductive: Mildly enlarged uterus with probable 4.4 cm left uterine body fibroid. Simple 1.4 cm left adnexal cyst (series 3/image 104). No additional adnexal lesions. Other: No pneumoperitoneum, ascites or focal fluid collection. Musculoskeletal: No aggressive appearing focal osseous lesions. Moderate lumbar spondylosis. IMPRESSION: 1. Irregular solid 5.0 cm right breast mass, suspicious for primary right breast malignancy. 2. Innumerable solid pulmonary nodules scattered throughout both lungs, compatible with pulmonary metastases. 3. No evidence of metastatic disease in the abdomen, pelvis or skeleton. 4. Mildly enlarged and probably myomatous uterus. Simple 1.4 cm left adnexal cyst requires no follow-up. This recommendation follows ACR consensus guidelines: White Paper of the ACR Incidental Findings Committee II on Adnexal Findings. J  Am Coll Radiol 2013:10:675-681. 5.  Aortic Atherosclerosis (ICD10-I70.0). Electronically Signed   By: Ilona Sorrel M.D.   On: 02/28/2019 09:27    ASSESSMENT & PLAN:   Anda Sobotta is a 53 y.o. female with:  1. Newly diagnosed metastatic breast cancer ER+/PRneg/Her2 neg  02/28/2019 neck CTwith results revealing "negative for mass or adenopathy in the neck."  03/04/2019 head MRI with results revealing "Negative for metastatic disease.  No acute abnormality in the brain."  2. Likely Pulmonary metastases  02/18/2019 chest and abdomen with results revealing "5cm spiculated soft tissue mass in inferior right breast, highly suspicious for primary breast carcinoma. No acute findings or metastatic disease within the abdomen or pelvis. Multiple small pulmonary nodules in both lung bases, consistent with pulmonary metastases. 4.5 cm uterine fibroid."  02/28/2019 C/A/P CT with results revealing "Irregular solid 5.0 cm right breast mass, suspicious for primary right breast malignancy. Innumerable solid pulmonary nodules scattered throughout both lungs, compatible with pulmonary metastases. No evidence of metastatic disease in the abdomen, pelvis or skeleton. Mildly enlarged and probably myomatous uterus. Simple 1.4 cm left adnexal cyst requires no follow-up. This recommendation follows ACR consensus guidelines: White Paper of the ACR Incidental Findings Committee II on Adnexal Findings. J Am Coll Radiol [phone]. Aortic Atherosclerosis (ICD10-I70.0)."   PLAN:  -Discussed pt labwork today, 02/22/2019; all values are WNL except for Creatinine at 1.06, GFR, Est Non Af Am at 58 09/18/2020CA 27.29 at 42.6  -Discussed that pathology confirmed Invasive ductal carcenoma of the breast. No enlarged lymphs but there are nodules on the lung which are consistent with pulmonary mets -Biopsy not recommend at this time due to the small size of nodules. Suggested to monitor the nodules in the lungs with rx.   -Advised that treatment will be to treat and control stage 4 disease. Hormonal treatment advised.   -Advised that the whole body CT scan did not show any spreading to the bone.  -Advised that she has Estrogen positive receptors, progesterone and HER2 receptors negative. Explained that these receptors show what causes the cancer cells to grow and spread. Advised that there are  medication to block the receptors.  -Discussed the burning sensation in right breast . Previous surgery can cause changes in nervers and sensation.  -Advised that cymbalta and neurontin can be prescribed for nerve pain, pt wants to try neurontin first.  -Recommended sports bra to support breast  -Will schedule bone density test to make sure to optimize bone health -Discussed stage 4 hormone positive cancer. Review active issues No pre   -Advised that [place], LH and progesterone  levels are post menopausal. Estradiol <5 consistent with menopause -Advised that since there are no major organs involved there is no need for Chemotherapy -Discussed that treatment will be with the use of oral medication Aromatase inhibitor (Letrozole) + CDK4/6 inhibitor (Verzenio).   -Will see if pharmacist is available to day to speak with pt today. -Discussed the option for genetic testing and its benefits. Pt would like to schedule genetic testing. (will give referal on next f/u) -Advised that initial labs every couple of weeks for the first few weeks to make sure she is tolerating treatment. Then every two months    FOLLOW UP: Lab today DEXA scan in 2 weeks RTC with Dr Irene Limbo in 3 weeks with labs   The total time spent in the appt was 45 minutes and more than 50% was on counseling and direct patient cares.  All of the patient's questions were answered with apparent satisfaction. The patient knows to call the clinic with any problems, questions or concerns.      Sullivan Lone MD MS AAHIVMS [facility] Hematology/Oncology Physician [facility]  (Office):       [phone] (Work cell):  [phone] (Fax):           [phone]  03/21/2019 9:14 PM  I, Scot Dock, am acting as a scribe for Dr. Sullivan Lone.   .I have reviewed the above documentation for accuracy and completeness, and I agree with the above. Brunetta Genera MD

## 2019-03-22 ENCOUNTER — Inpatient Hospital Stay (HOSPITAL_BASED_OUTPATIENT_CLINIC_OR_DEPARTMENT_OTHER): Payer: BC Managed Care – PPO | Admitting: Hematology

## 2019-03-22 ENCOUNTER — Telehealth: Payer: Self-pay | Admitting: Hematology

## 2019-03-22 ENCOUNTER — Other Ambulatory Visit: Payer: Self-pay

## 2019-03-22 ENCOUNTER — Inpatient Hospital Stay: Payer: BC Managed Care – PPO

## 2019-03-22 VITALS — BP 145/70 | HR 113 | Temp 98.3°F | Resp 18 | Ht 67.0 in | Wt 223.5 lb

## 2019-03-22 DIAGNOSIS — C50911 Malignant neoplasm of unspecified site of right female breast: Secondary | ICD-10-CM

## 2019-03-22 DIAGNOSIS — C50511 Malignant neoplasm of lower-outer quadrant of right female breast: Secondary | ICD-10-CM | POA: Diagnosis not present

## 2019-03-22 DIAGNOSIS — C7800 Secondary malignant neoplasm of unspecified lung: Secondary | ICD-10-CM

## 2019-03-22 MED ORDER — LETROZOLE 2.5 MG PO TABS: 2.5000 mg | ORAL_TABLET | Freq: Every day | ORAL | 5 refills | Status: DC

## 2019-03-22 MED ORDER — ABEMACICLIB 100 MG PO TABS: 100.0000 mg | ORAL_TABLET | Freq: Two times a day (BID) | ORAL | 2 refills | Status: DC

## 2019-03-22 NOTE — Telephone Encounter (Signed)
Scheduled per 10/16 los, patient received after visit summary and calender. Checked patient in for labs.

## 2019-03-23 LAB — ESTRADIOL: Estradiol: <5 pg/mL

## 2019-03-25 ENCOUNTER — Telehealth: Payer: Self-pay | Admitting: Pharmacist

## 2019-03-25 ENCOUNTER — Telehealth: Payer: Self-pay

## 2019-03-25 NOTE — Telephone Encounter (Signed)
Oral Oncology Pharmacist Encounter  Received new prescription for Verzenio (abemaciclib) for the treatment of HR-positive, HER2-negative metastatic breast cancer in conjunction with letrozole, planned duration until disease progression or unacceptable toxicity.  Patient newly diagnosed with breast cancer. Surgical pathology from 03/07/19 showed ER positivity (95%), PR negative, HER2+ negative with a Ki67 of 25%. CT Chest from 02/28/19 showed innumerable solid pulmonary nodules scattered throughout both lungs, compatible with pulmonary metastases.   Plan is for patient to start Verzenio (abemaciclib) 100 mg (1 tablet) by mouth 2 times daily in combination with letrozole 2.5 mg by mouth daily. Patient's Verzenio dose may increase to 150 mg by mouth 2 times daily based on toleration.   CBC w/ diff from 02/18/19 and CMP from 02/22/19 assessed:  CMP stable - Scr slightly increased from 0.92 to 1.06 (CrCl ~97 mL/min) - no dose adjustments necessary.   CBC stable  Current medication list in Epic reviewed, no DDIs with Verzenio identified.  Prescription has been e-scribed to the [facility] for benefits analysis and approval.  Oral Oncology Clinic will continue to follow for insurance authorization, copayment issues, initial counseling and start date.  Leron Croak, PharmD PGY2 Hematology/Oncology Pharmacy Resident 03/25/2019 12:26 PM Oral Oncology Clinic [phone]

## 2019-03-25 NOTE — Telephone Encounter (Signed)
Oral Oncology Patient Advocate Encounter  Received notification from [facility] that prior authorization for Verzenio is required.  PA submitted on CoverMyMeds Key [id number] Status is pending  Oral Oncology Clinic will continue to follow.  [place] Patient [place] Phone [phone] Fax [phone] 03/25/2019    1:58 PM

## 2019-03-25 NOTE — Telephone Encounter (Signed)
Oral Oncology Patient Advocate Encounter  Prior Authorization for Brandy [name] has been approved.    PA# [id number] Effective dates: 03/25/19 through 03/23/20  Oral Oncology Clinic will continue to follow.   [place] Patient Brandy [name] Phone [phone] Fax [phone] 03/25/2019    3:53 PM

## 2019-03-26 ENCOUNTER — Other Ambulatory Visit: Payer: Self-pay | Admitting: Hematology

## 2019-03-26 MED ORDER — ONDANSETRON HCL 8 MG PO TABS: 8.0000 mg | ORAL_TABLET | Freq: Three times a day (TID) | ORAL | 3 refills | Status: DC | PRN

## 2019-03-26 NOTE — Telephone Encounter (Signed)
Oral Chemotherapy Pharmacist Encounter  I spoke with patient for overview of: Verzenio for the treatment of HER2-negative, hormone-receptor positive metastatic breast cancer, in combination with letrozole, planned duration until disease progression or unacceptable toxicity.   Counseled patient on administration, dosing, side effects, monitoring, drug-food interactions, safe handling, storage, and disposal.  Patient will take Verzenio 143m tablets, 1 tablet by mouth twice daily without regard to food.  Patient knows to avoid grapefruit and grapefruit juice.  Patient is taking letrozole 2.5 mg once daily.  Verzenio start date: 03/26/19  Adverse effects include but are not limited to: diarrhea, fatigue, nausea, abdominal pain, hair loss, decreased blood counts, and increased liver function tests, and joint pains. Severe, life-threatening, and/or fatal interstitial lung disease (ILD) and/or pneumonitis may occur with CDK 4/6 inhibitors.  Have sent message to MD regarding antiemetic prescription, so patient can have an anti-emetic on hand if nausea develops.   Patient will obtain anti diarrheal and alert the office of 4 or more loose stools above baseline.  Reviewed with patient importance of keeping a medication schedule and plan for any missed doses.  Medication reconciliation performed and medication/allergy list updated.  Insurance authorization for [employer] been obtained. Test claim at the pharmacy revealed copayment $0 for 1st fill of Verzenio. [facility]  Patient informed the pharmacy will reach out 5-7 days prior to needing next fill of Verzenio to coordinate continued medication acquisition to prevent break in therapy.  All questions answered.  Ms. WDavis Gourdvoiced understanding and appreciation.   Patient knows to call the office with questions or concerns.  RLeron Croak PharmD PGY2 Hematology/Oncology  Pharmacy Resident 03/26/2019 12:41 PM Oral Oncology Clinic [phone]

## 2019-04-01 ENCOUNTER — Encounter: Payer: Self-pay | Admitting: Hematology

## 2019-04-01 ENCOUNTER — Telehealth: Payer: Self-pay | Admitting: Hematology

## 2019-04-01 NOTE — Telephone Encounter (Signed)
[place] CME. appointments moved from 11/6 to 11/10. Left message. Schedule mailed.

## 2019-04-09 ENCOUNTER — Ambulatory Visit
Admission: RE | Admit: 2019-04-09 | Discharge: 2019-04-09 | Disposition: A | Discharge status: Home / Self Care | Payer: BC Managed Care – PPO | Source: Ambulatory Visit | Attending: Hematology | Admitting: Hematology

## 2019-04-09 DIAGNOSIS — C50911 Malignant neoplasm of unspecified site of right female breast: Secondary | ICD-10-CM | POA: Diagnosis present

## 2019-04-09 DIAGNOSIS — C7800 Secondary malignant neoplasm of unspecified lung: Secondary | ICD-10-CM | POA: Diagnosis present

## 2019-04-12 ENCOUNTER — Ambulatory Visit: Payer: BC Managed Care – PPO | Admitting: Hematology

## 2019-04-12 ENCOUNTER — Other Ambulatory Visit: Payer: BC Managed Care – PPO

## 2019-04-16 ENCOUNTER — Telehealth: Payer: Self-pay | Admitting: Hematology

## 2019-04-16 ENCOUNTER — Inpatient Hospital Stay (HOSPITAL_BASED_OUTPATIENT_CLINIC_OR_DEPARTMENT_OTHER): Payer: BC Managed Care – PPO | Admitting: Hematology

## 2019-04-16 ENCOUNTER — Inpatient Hospital Stay: Payer: BC Managed Care – PPO | Attending: Hematology

## 2019-04-16 ENCOUNTER — Other Ambulatory Visit: Payer: Self-pay

## 2019-04-16 VITALS — BP 166/82 | HR 98 | Temp 98.7°F | Resp 18 | Ht 67.0 in | Wt 222.5 lb

## 2019-04-16 DIAGNOSIS — Z6834 Body mass index (BMI) 34.0-34.9, adult: Secondary | ICD-10-CM | POA: Diagnosis not present

## 2019-04-16 DIAGNOSIS — Z79899 Other long term (current) drug therapy: Secondary | ICD-10-CM | POA: Insufficient documentation

## 2019-04-16 DIAGNOSIS — C7800 Secondary malignant neoplasm of unspecified lung: Secondary | ICD-10-CM | POA: Diagnosis not present

## 2019-04-16 DIAGNOSIS — C50911 Malignant neoplasm of unspecified site of right female breast: Secondary | ICD-10-CM

## 2019-04-16 DIAGNOSIS — C7801 Secondary malignant neoplasm of right lung: Secondary | ICD-10-CM | POA: Insufficient documentation

## 2019-04-16 DIAGNOSIS — N838 Other noninflammatory disorders of ovary, fallopian tube and broad ligament: Secondary | ICD-10-CM | POA: Diagnosis not present

## 2019-04-16 DIAGNOSIS — C7802 Secondary malignant neoplasm of left lung: Secondary | ICD-10-CM | POA: Diagnosis present

## 2019-04-16 DIAGNOSIS — F17210 Nicotine dependence, cigarettes, uncomplicated: Secondary | ICD-10-CM | POA: Insufficient documentation

## 2019-04-16 DIAGNOSIS — Z170 Estrogen receptor positive status [ER+]: Secondary | ICD-10-CM | POA: Diagnosis not present

## 2019-04-16 DIAGNOSIS — D709 Neutropenia, unspecified: Secondary | ICD-10-CM | POA: Insufficient documentation

## 2019-04-16 DIAGNOSIS — I700 Atherosclerosis of aorta: Secondary | ICD-10-CM | POA: Insufficient documentation

## 2019-04-16 LAB — CBC WITH DIFFERENTIAL/PLATELET
Abs Immature Granulocytes: 0.01 10*3/uL (ref 0.00–0.07)
Basophils Absolute: 0 10*3/uL (ref 0.0–0.1)
Basophils Relative: 1 %
Eosinophils Absolute: 0.2 10*3/uL (ref 0.0–0.5)
Eosinophils Relative: 4 %
HCT: 35.6 % — ABNORMAL LOW (ref 36.0–46.0)
Hemoglobin: 11.6 g/dL — ABNORMAL LOW (ref 12.0–15.0)
Immature Granulocytes: 0 %
Lymphocytes Relative: 63 %
Lymphs Abs: 2.5 10*3/uL (ref 0.7–4.0)
MCH: 31.4 pg (ref 26.0–34.0)
MCHC: 32.6 g/dL (ref 30.0–36.0)
MCV: 96.2 fL (ref 80.0–100.0)
Monocytes Absolute: 0.2 10*3/uL (ref 0.1–1.0)
Monocytes Relative: 4 %
Neutro Abs: 1.1 10*3/uL — ABNORMAL LOW (ref 1.7–7.7)
Neutrophils Relative %: 28 %
Platelets: 240 10*3/uL (ref 150–400)
RBC: 3.7 MIL/uL — ABNORMAL LOW (ref 3.87–5.11)
RDW: 13.9 % (ref 11.5–15.5)
WBC: 4 10*3/uL (ref 4.0–10.5)
nRBC: 0 % (ref 0.0–0.2)

## 2019-04-16 LAB — CMP (CANCER CENTER ONLY)
ALT: 95 U/L — ABNORMAL HIGH (ref 0–44)
AST: 57 U/L — ABNORMAL HIGH (ref 15–41)
Albumin: 3.9 g/dL (ref 3.5–5.0)
Alkaline Phosphatase: 129 U/L — ABNORMAL HIGH (ref 38–126)
Anion gap: 8 (ref 5–15)
BUN: 8 mg/dL (ref 6–20)
CO2: 30 mmol/L (ref 22–32)
Calcium: 9.3 mg/dL (ref 8.9–10.3)
Chloride: 104 mmol/L (ref 98–111)
Creatinine: 1.21 mg/dL — ABNORMAL HIGH (ref 0.44–1.00)
GFR, Est AFR Am: 59 mL/min — ABNORMAL LOW (ref >60)
GFR, Estimated: 51 mL/min — ABNORMAL LOW (ref >60)
Glucose, Bld: 101 mg/dL — ABNORMAL HIGH (ref 70–99)
Potassium: 4.2 mmol/L (ref 3.5–5.1)
Sodium: 142 mmol/L (ref 135–145)
Total Bilirubin: 0.5 mg/dL (ref 0.3–1.2)
Total Protein: 7.6 g/dL (ref 6.5–8.1)

## 2019-04-16 LAB — VITAMIN D 25 HYDROXY (VIT D DEFICIENCY, FRACTURES): Vit D, 25-Hydroxy: 27.77 ng/mL — ABNORMAL LOW (ref 30–100)

## 2019-04-16 NOTE — Progress Notes (Signed)
HEMATOLOGY/ONCOLOGY CLINIC NOTE  Date of Service: 04/16/2019  Patient Care Team: [facility] as PCP - General Everardo Beals, NP as Nurse Practitioner [facility]  CHIEF COMPLAINTS/PURPOSE OF CONSULTATION:  mx of newly diagnosed metastatic breast cancer   HISTORY OF PRESENTING ILLNESS:  Brandy [name] is a wonderful 53 y.o. female who has been referred to [place] by [facility] for evaluation and management of her suspected breast cancer.   The pt reports that she was having increasing low back pain on her right side that would not resolve, so she went to the ED. This pain has not resolved. She notes pain in her hips and knees.   Of note prior to the patient's visit today, pt has had an abdominal limited right upper ultrasound completed on 02/18/2019 with results revealing "Probable hepatic steatosis. No other abnormality seen in the right upper quadrant of the abdomen."  Pt had abdomen and pelvis CT completed on 02/18/2019 with results revealing "5cm spiculated soft tissue mass in inferior right breast, highly suspicious for primary breast carcinoma. No acute findings or metastatic disease within the abdomen or pelvis. Multiple small pulmonary nodules in both lung bases, consistent with pulmonary metastases. 4.5 cm uterine fibroid."  Most recent lab results (02/18/2019) of CBC is as follows: all values are WNL except for glucose at 121, calcium at 8.7, AST at 49, and ALT at 53.  On review of systems, pt reports pedal edema and denies chest pain, shortness of breath, weight loss, and any other symptoms.  Her last known mammography was on 06/18/2012.   On Social Hx the pt reports that she is a smoker and she smokes about half a pack per day. She does not drink alcohol. She has three children: age 54, 77, and 43.  On Family Hx the pt reports lung cancer. Maternal Cousin passed from stomach cancer at the age of 50.    INTERVAL  HISTORY:  Brandy [name] is a 53 y.o. female here for evaluation and management of likely metastatic breast cancer. The patient's last visit with [place] was on 03/22/2019. The pt reports that she is doing well overall.  The pt reports that she has had increased dry coughing, with no phlegm and SOB. She doesn't experience it everyday but when she does it is very bothersome. She has been trying to use [employer] to help suppress her cough but it returns as soon as she finishes the candy. Pt denies any current acid reflux symptoms. She used Nyquil and Mucinex a few weeks ago which did temporarily improve her symptoms. Her coughing and SOB is not worsened by movement and is not worse in the morning. She is having soft stools that are not runny due to Verzenio. She is currently moving her bowels 1-2 times per day. Pt did notice severe stomach cramping on Friday but attributes it to eating a large meal after not eating for the majority of the day. Pt has been eating well and is trying to remain as active as possible. Pt is no longer taking Chantix but was advised that increased coughing is normal when quitting smoking. She continues to cut down on her cigarette smoking, although complete cessation as not been reached. Pt denies drinking any alcohol as she does not like the taste. Pt continues take Ergocalciferol once a week. She is no longer taking Paxil but is still taking Elavil at night. She is trying to cut down on Xanax. She does have a burning  sensation in her right breast occasionally but takes Neurontin as needed when that occurs. Pt is interested in going to church and helping with the outreach programs but knows that chemotherapy does impair her immune system.   Of note since the patient's last visit, pt has had Bone Density ([id number]) completed on 04/09/2019 with results revealing "The BMD measured at Femur Neck Right is 1.153 g/cm2 with a T-score of 0.8. This patient is considered normal according to  [facility]) criteria. Lumbar spine was not utilized due to advanced degenerative changes. The scan quality is good. Femur Neck Right 04/09/2019 54.7 Normal 0.8 1.153 g/cm2. Left Forearm Radius 33% 04/09/2019 54.7 Normal 0.8 0.949 g/cm2."  Lab results today (04/16/19) of CBC w/diff and CMP is as follows: all values are WNL except for RBC at 3.70, Hgb at 11.6, HCT at 35.6, Neutro Abs at 1.1K, WBC Morphology shows "Variant Lymph", Glucose at 101, Creatinine at 1.21, AST at 57, ALT at 95, Alkaline Phosphatase at 129, GFR Est Afr Am at 59. 04/16/2019 Vitamin D 25-Hydroxy is in progress   On review of systems, pt reports dry cough, SOB, earache, burning sensation in right breast, soft stools and denies fevers, runny nose, chest pain, chest tightness, wheezing, nausea, headaches, skin rashes, acid reflux symptoms, dysuria, urinary issues, diarrhea and any other symptoms.    MEDICAL HISTORY:  Past Medical History:  Diagnosis Date   Arthritis    ASD (atrial septal defect)    s/p closure with Amplatzer device 10/05/04 (Dr. Myriam Jacobson[facility]) 10/05/04   Cataract    GERD (gastroesophageal reflux disease)    Headache    Heart murmur    no longer heard   Legally blind in right eye, as defined in [place]    Lumbar herniated disc    Sciatica   Uterine Fibroids  SURGICAL HISTORY: Past Surgical History:  Procedure Laterality Date   ABLATION     BREAST SURGERY Bilateral 2011   Breast Reduction Surgery   BUNIONECTOMY     CARDIAC CATHETERIZATION     10/05/04 [facility]): LM < 25%, otherwise normal coronaries. No pulmonary HTN, Mildly enlarged RV. Secundum ASD s/p closure.   CARDIAC SURGERY     CATARACT EXTRACTION     CLEFT PALATE REPAIR     s/p cleft lip and palate repair   LUMBAR LAMINECTOMY/DECOMPRESSION MICRODISCECTOMY Left 05/23/2016   Procedure: LEFT L4-L5 LATERAL RECESS DECOMPRESSION WITH CENTRAL AND RIGHT DECOMPRESSION VIA LEFT SIDE;  Surgeon: [name], MD;   Location: [place];  Service: Orthopedics;  Laterality: Left;   LUMBAR LAMINECTOMY/DECOMPRESSION MICRODISCECTOMY Left 05/23/2016   Procedure: LUMBAR LAMINECTOMY/DECOMPRESSION MICRODISCECTOMY Lumbar five - Sacral One 1 LEVEL;  Surgeon: [name], MD;  Location: [place];  Service: Orthopedics;  Laterality: Left;   SHOULDER INJECTION Left 05/23/2016   Procedure: SHOULDER INJECTION;  Surgeon: [name], MD;  Location: [place];  Service: Orthopedics;  Laterality: Left;  band-aid per pa-c   TRANSTHORACIC ECHOCARDIOGRAM     12/15/05 [facility]): Mild LVH, EF > 57%, grade 1 diastolic dysfunction, Trivial MR/PR/TR.  Endometrial ablation 2003 Breast Reduction, bilateral 2011  SOCIAL HISTORY: Social History   Socioeconomic History   Marital status: Married    Spouse name: Not on file   Number of children: 3   Years of education: Not on file   Highest education level: Not on file  Occupational History   Not on file  Social Needs   Financial resource strain: Not on file  Food insecurity    Worry: Not on file    Inability: Not on file   Transportation needs    Medical: Not on file    Non-medical: Not on file  Tobacco Use   Smoking status: Current Every Day Smoker    Packs/day: 0.50    Types: Cigarettes   Smokeless tobacco: Never Used  Substance and Sexual Activity   Alcohol use: No   Drug use: No   Sexual activity: Not on file  Lifestyle   Physical activity    Days per week: Not on file    Minutes per session: Not on file   Stress: Not on file  Relationships   Social connections    Talks on phone: Not on file    Gets together: Not on file    Attends religious service: Not on file    Active member of club or organization: Not on file    Attends meetings of clubs or organizations: Not on file    Relationship status: Not on file   Intimate partner violence    Fear of current or ex partner: Not on file    Emotionally abused: Not on file    Physically abused: Not  on file    Forced sexual activity: Not on file  Other Topics Concern   Not on file  Social History Narrative   Not on file    FAMILY HISTORY: Family History  Problem Relation Age of Onset   Diabetes Mother    High blood pressure Mother    Cancer Father        Lung   Colon cancer Neg Hx    Colon polyps Neg Hx    Esophageal cancer Neg Hx    Rectal cancer Neg Hx    Stomach cancer Neg Hx     ALLERGIES:  is allergic to zithromax [azithromycin] and psyllium.  MEDICATIONS:  Current Outpatient Medications  Medication Sig Dispense Refill   abemaciclib (VERZENIO) 100 MG tablet Take 1 tablet (100 mg total) by mouth 2 (two) times daily. Swallow tablets whole. Do not chew, crush, or split tablets before swallowing. 60 tablet 2   ALPRAZolam (XANAX) 1 MG tablet Take 1 mg by mouth daily.     amitriptyline (ELAVIL) 75 MG tablet Take 75 mg by mouth at bedtime.   6   cyclobenzaprine (FLEXERIL) 10 MG tablet Take 1 tablet (10 mg total) by mouth 2 (two) times daily as needed for muscle spasms. 14 tablet 0   ergocalciferol (VITAMIN D2) 1.25 MG (50000 UT) capsule Take 1 capsule (50,000 Units total) by mouth once a week. 12 capsule 3   HYDROcodone-Acetaminophen 7.5-300 MG TABS Take 1 tablet by mouth 3 (three) times daily as needed. 60 tablet 0   letrozole (FEMARA) 2.5 MG tablet Take 1 tablet (2.5 mg total) by mouth daily. 30 tablet 5   ondansetron (ZOFRAN) 8 MG tablet Take 1 tablet (8 mg total) by mouth every 8 (eight) hours as needed for nausea or vomiting. 30 tablet 3   PARoxetine (PAXIL) 10 MG tablet Take 10 mg by mouth daily.     Current Facility-Administered Medications  Medication Dose Route Frequency Provider Last Rate Last Dose   0.9 %  sodium chloride infusion  500 mL Intravenous Continuous Nandigam, Venia Minks, MD        REVIEW OF SYSTEMS:   A 10+ POINT REVIEW OF SYSTEMS WAS OBTAINED including neurology, dermatology, psychiatry, cardiac, respiratory, lymph, extremities,  GI, GU, Musculoskeletal, constitutional, breasts, reproductive, HEENT.  All pertinent positives are noted in the HPI.  All others are negative.    PHYSICAL EXAMINATION: ECOG FS:1 - Symptomatic but completely ambulatory  Vitals:   04/16/19 1457  BP: (!) 166/82  Pulse: 98  Resp: 18  Temp: 98.7 F (37.1 C)  SpO2: 100%   Wt Readings from Last 3 Encounters:  04/16/19 222 lb 8 oz (100.9 kg)  03/22/19 223 lb 8 oz (101.4 kg)  03/08/19 228 lb 12.8 oz (103.8 kg)   Body mass index is 34.85 kg/m.     GENERAL:alert, in no acute distress and comfortable SKIN: no acute rashes, no significant lesions EYES: conjunctiva are pink and non-injected, sclera anicteric OROPHARYNX: MMM, no exudates, no oropharyngeal erythema or ulceration NECK: supple, no JVD LYMPH:  no palpable lymphadenopathy in the cervical, axillary or inguinal regions LUNGS: clear to auscultation b/l with normal respiratory effort HEART: regular rate & rhythm ABDOMEN:  normoactive bowel sounds , non tender, not distended. No palpable hepatosplenomegaly.  Extremity: no pedal edema PSYCH: alert & oriented x 3 with fluent speech NEURO: no focal motor/sensory deficits  LABORATORY DATA:  I have reviewed the data as listed  . CBC Latest Ref Rng & Units 04/16/2019 02/18/2019 12/29/2016  WBC 4.0 - 10.5 K/uL 4.0 9.1 7.2  Hemoglobin 12.0 - 15.0 g/dL 11.6(L) 12.1 13.2  Hematocrit 36.0 - 46.0 % 35.6(L) 38.8 40.6  Platelets 150 - 400 K/uL 240 377 348    . CMP Latest Ref Rng & Units 04/16/2019 02/22/2019 02/18/2019  Glucose 70 - 99 mg/dL 101(H) 91 121(H)  BUN 6 - 20 mg/dL _0 Creatinine 0.44 - 1.00 mg/dL 1.21(H) 1.06(H) 0.92  Sodium 135 - 145 mmol/L 142 142 140  Potassium 3.5 - 5.1 mmol/L 4.2 3.9 3.9  Chloride 98 - 111 mmol/L 104 103 104  CO2 22 - 32 mmol/L _1 Calcium 8.9 - 10.3 mg/dL 9.3 9.5 8.7(L)  Total Protein 6.5 - 8.1 g/dL 7.6 7.4 7.2  Total Bilirubin 0.3 - 1.2 mg/dL 0.5 0.4 0.4  Alkaline Phos 38 - 126 U/L 129(H)  118 99  AST 15 - 41 U/L 57(H) 39 49(H)  ALT 0 - 44 U/L 95(H) 39 53(H)   Component     Latest Ref Rng & Units 03/08/2019 03/22/2019  FSH     mIU/mL 65.5   LH     mIU/mL 46.6   Estrogen     pg/mL 82   Progesterone     ng/mL <0.1   Estradiol     pg/mL  <5.0      RADIOGRAPHIC STUDIES: I have personally reviewed the radiological images as listed and agreed with the findings in the report. Nm Bone Scan Whole Body  Result Date: 03/20/2019 CLINICAL DATA:  Breast cancer, initial staging, question bone metastasis. EXAM: NUCLEAR MEDICINE WHOLE BODY BONE SCAN TECHNIQUE: Whole body anterior and posterior images were obtained approximately 3 hours after intravenous injection of radiopharmaceutical. RADIOPHARMACEUTICALS:  22.4 mCi Technetium-3mMDP IV COMPARISON:  CT 02/28/2019 FINDINGS: Fairly intense uptake posteriorly in the lumbar spine from L2-L4. This corresponds to a bony bridging over the posterior elements/facet joints on comparison CT. Findings consistent with degenerative bone disease. No additional foci of uptake to suggest skeletal metastasis. IMPRESSION: 1. No scintigraphic evidence skeletal metastasis. 2. 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 Electronically Signed   By: SSuzy BouchardM.D.   On: 03/20/2019 08:40   Dg Bone Density  Result Date:  04/09/2019 EXAM: DUAL X-RAY ABSORPTIOMETRY (DXA) FOR BONE MINERAL DENSITY IMPRESSION: Patient:             Brandy [name], Brandy [name] Birth Date:          [birth year]    [age] Height / Weight:     67.0 in.    222.0 lbs. Sex / Ethnic:        Female    Black Facility ID: Referring Physician: Dr. Irene Limbo Measured:            04/09/2019 9:31:23 AM (14.10) Analyzed:            04/09/2019 9:31:54 AM (14.10) Left Forearm Bone Density BMD     Young-Adult Age-Matched Region       (g/cm2) T-score     Z-score     WHO Classification Radius UD    0.582   2.7         2.4          - Ulna UD      0.420   -           -             - Radius 33%   0.949   0.8         0.6         Normal Ulna 33%     1.009   -           -            - Both UD      0.517   -           -            - Both 33%     0.975   -           -            - Radius Total 0.767   1.5         1.2          - Ulna Total   0.723   -           -            - Both Total   0.750   -           -            - Patient:             Brandy [name], Brandy [name] Birth Date:          April 04, 1966    [age] Height / Weight:     67.0 in.    222.0 lbs. Sex / Ethnic:        Female    Black Facility ID: Referring Physician: Dr. Irene Limbo Measured:            04/09/2019 9:27:06 AM (14.10) Analyzed:            04/09/2019 9:28:20 AM (14.10) DualFemur Bone Density BMD     Young-Adult Age-Matched Region       (g/cm2) T-score     Z-score     WHO Classification Neck Left       1.193   1.1         1.2         Normal Right      1.153   0.8         0.9  Normal Mean       1.173   1.0         1.1         Normal Difference 0.040   0.3         0.3          - Total Left       1.207   1.6         1.2         Normal Right      1.228   1.7         1.4         Normal Mean       1.217   1.7         1.3         Normal Difference 0.021   0.2         0.2          - Hip Axis Length Comparison (mm) HAL chart results unavailable Technologist:VLM Your patient Vida Nicol completed a BMD test on 04/09/2019 using the [place] (analysis version: 14.10) manufactured by [employer]. The following summarizes the results of our evaluation. PATIENT BIOGRAPHICAL: Name: Brandy [name], Brandy [name] Patient ID: [id number] Birth Date: August 05, 1965 Height: 67.0 in. Gender: Female Exam Date: 04/09/2019 Weight: 222.0 lbs. Indications: High Risk Meds, History of Breast Cancer, History of Spinal Surgery, Tobacco User Fractures: Treatments: Letrozole, Vitamin D ASSESSMENT: The BMD measured at Femur Neck Right is 1.153 g/cm2 with a T-score of 0.8. This patient is considered normal according to [facility])  criteria. Lumbar spine was not utilized due to advanced degenerative changes. The scan quality is good. Site Region Measured Measured WHO Young Adult BMD Date       Age      Classification T-score DualFemur Neck Right 04/09/2019 54.7 Normal 0.8 1.153 g/cm2 Left Forearm Radius 33% 04/09/2019 54.7 Normal 0.8 0.949 g/cm2 World Health Organization [facility]) criteria for post-menopausal, Caucasian Women: Normal:       T-score at or above -1 SD Osteopenia:   T-score between -1 and -2.5 SD Osteoporosis: T-score at or below -2.5 SD RECOMMENDATIONS: 1. All patients should optimize calcium and vitamin D intake. 2. Consider FDA-approved medical therapies in postmenopausal women and men aged 35 years and older, based on the following: a. A hip or vertebral(clinical or morphometric) fracture b. T-score < -2.5 at the femoral neck or spine after appropriate evaluation to exclude secondary causes c. Low bone mass (T-score between -1.0 and -2.5 at the femoral neck or spine) and a 10-year probability of a hip fracture > 3% or a 10-year probability of a major osteoporosis-related fracture > 20% based on the [place]-adapted WHO algorithm d. Clinician judgment and/or patient preferences may indicate treatment for people with 10-year fracture probabilities above or below these levels FOLLOW-UP: People with diagnosed cases of osteoporosis or at high risk for fracture should have regular bone mineral density tests. For patients eligible for Medicare, routine testing is allowed once every 2 years. The testing frequency can be increased to one year for patients who have rapidly progressing disease, those who are receiving or discontinuing medical therapy to restore bone mass, or have additional risk factors. I have reviewed this report, and agree with the above findings. [facility] Electronically Signed   By: Lowella Grip III M.D.   On: 04/09/2019 09:33    ASSESSMENT & PLAN:   Brandy [name] is a 53 y.o. female with:  1.  Newly diagnosed metastatic breast cancer ER+/PRneg/Her2 neg  02/28/2019 neck CT with results revealing "negative for mass or adenopathy in the neck."  03/04/2019 head MRI with results revealing "Negative for metastatic disease.  No acute abnormality in the brain."  2. Likely Pulmonary metastases  02/18/2019 chest and abdomen with results revealing "5cm spiculated soft tissue mass in inferior right breast, highly suspicious for primary breast carcinoma. No acute findings or metastatic disease within the abdomen or pelvis. Multiple small pulmonary nodules in both lung bases, consistent with pulmonary metastases. 4.5 cm uterine fibroid."  02/28/2019 C/A/P CT with results revealing "Irregular solid 5.0 cm right breast mass, suspicious for primary right breast malignancy. Innumerable solid pulmonary nodules scattered throughout both lungs, compatible with pulmonary metastases. No evidence of metastatic disease in the abdomen, pelvis or skeleton. Mildly enlarged and probably myomatous uterus. Simple 1.4 cm left adnexal cyst requires no follow-up. This recommendation follows ACR consensus guidelines: White Paper of the ACR Incidental Findings Committee II on Adnexal Findings. J Am Coll Radiol [phone]. Aortic Atherosclerosis (ICD10-I70.0)."  NUCLEAR MEDICINE WHOLE BODY BONE SCAN completed on 03/19/2019 with results revealing "1. No scintigraphic evidence skeletal metastasis. 2. Degenerative bone disease in the posterior elements of the upper and mid lumbar spine."   PLAN: -Discussed pt labwork today, 04/16/19; mild neutropenia, PLTs nml, liver enzymes are up and down -Discussed 04/16/2019 Vitamin D 25-Hydroxy is in progress  -Discussed 04/09/2019 Bone Density ([id number]) which revealed "The BMD measured at Femur Neck Right is 1.153 g/cm2 with a T-score of 0.8. This patient is considered normal according to [facility]) criteria. Lumbar spine was not utilized due to advanced  degenerative changes. The scan quality is good. Femur Neck Right 04/09/2019 54.7 Normal 0.8 1.153 g/cm2. Left Forearm Radius 33% 04/09/2019 54.7 Normal 0.8 0.949 g/cm2." -If WBC continues to decline, will consider adjusting Verzenio dose  -Will rpt Bone Density Study every 1-2 years  -Recommended that the pt continue to eat well and drink at least 48-64 oz of water each day  -Recommended pt increase intake of fresh fruits and veggies, minimize processed foods, sodas, red meat -Recommend steam inhaler for pt's cough, could Rx Tessalon Perles if needed -Discussed proper pandemic safety precautions  -No unreasonable for pt to take OTC multivitamin daily  -Advised pt to avoid OTC pain medication -Continue Ergocalciferol weekly  -Continue Neurontin as needed -The pt has no prohibitive toxicities from continuing 2.5 mg Letrozole and 100 mg Verzenio BID at this time  -Advised that we will see back every 3 of weeks to make sure she is tolerating treatment. Will move to every two months if next labs stable.  -Will see back in 3 weeks with labs    FOLLOW UP: RTC with Dr Irene Limbo with labs in about 3 weeks   The total time spent in the appt was 40 minutes and more than 50% was on counseling and direct patient cares.  All of the patient's questions were answered with apparent satisfaction. The patient knows to call the clinic with any problems, questions or concerns.   Sullivan Lone MD [place] AAHIVMS [facility] Hematology/Oncology Physician [facility]  (Office):       [phone] (Work cell):  [phone] (Fax):           [phone]  04/16/2019 4:34 PM  I, Yevette Edwards, am acting as a scribe for Dr. Sullivan Lone.   .I have reviewed the above documentation for accuracy and completeness, and I agree with the above. Suzan Slick  Juleen China MD

## 2019-04-16 NOTE — Telephone Encounter (Signed)
Gave avs and calendar ° °

## 2019-04-20 ENCOUNTER — Encounter: Payer: Self-pay | Admitting: Hematology

## 2019-04-22 ENCOUNTER — Other Ambulatory Visit: Payer: Self-pay | Admitting: Hematology

## 2019-04-22 ENCOUNTER — Encounter: Payer: Self-pay | Admitting: Hematology

## 2019-04-22 DIAGNOSIS — C50911 Malignant neoplasm of unspecified site of right female breast: Secondary | ICD-10-CM

## 2019-04-22 DIAGNOSIS — C7800 Secondary malignant neoplasm of unspecified lung: Secondary | ICD-10-CM

## 2019-04-22 MED ORDER — BENZONATATE 100 MG PO CAPS: 100.0000 mg | ORAL_CAPSULE | Freq: Three times a day (TID) | ORAL | 0 refills | Status: DC | PRN

## 2019-04-22 NOTE — Progress Notes (Signed)
Tessalon perles prescription

## 2019-05-01 ENCOUNTER — Telehealth: Payer: Self-pay | Admitting: Pharmacist

## 2019-05-01 NOTE — Telephone Encounter (Signed)
Oral Chemotherapy Pharmacist Encounter   Spoke with patient today to follow up regarding patient's oral chemotherapy medication: Verzenio (abemaciclib)  Original Start date of oral chemotherapy: 03/26/19  Pt reports 0 tablets/doses of Verzenio missed in the last month.   Pt reports the following side effects: patient experienced some diarrhea during the beginning of taking Verzenio, but was able to treat it with loperamide and has had no issues since then. Patient denies any other issues experienced in the last month.   Pertinent labs reviewed: CBC w/ Diff and CMP from 04/16/19 reviewed - noted increase in Scr to 1.21 and slightly elevated LFTs, but no dose adjustments are needed at this time.  Patient knows to call the office with questions or concerns.  Leron Croak, PharmD, [place] PGY2 Hematology/Oncology Pharmacy Resident 05/01/2019 4:22 PM Oral Oncology Clinic [phone]

## 2019-05-06 NOTE — Progress Notes (Signed)
HEMATOLOGY/ONCOLOGY CLINIC NOTE  Date of Service: 05/07/2019  Patient Care Team: [facility] as PCP - General Everardo Beals, NP as Nurse Practitioner [facility]  CHIEF COMPLAINTS/PURPOSE OF CONSULTATION:  mx of newly diagnosed metastatic breast cancer   HISTORY OF PRESENTING ILLNESS:  Brandy [name] is a wonderful 53 y.o. female who has been referred to [place] by [facility] for evaluation and management of her suspected breast cancer.   The pt reports that she was having increasing low back pain on her right side that would not resolve, so she went to the ED. This pain has not resolved. She notes pain in her hips and knees.   Of note prior to the patient's visit today, pt has had an abdominal limited right upper ultrasound completed on 02/18/2019 with results revealing "Probable hepatic steatosis. No other abnormality seen in the right upper quadrant of the abdomen."  Pt had abdomen and pelvis CT completed on 02/18/2019 with results revealing "5cm spiculated soft tissue mass in inferior right breast, highly suspicious for primary breast carcinoma. No acute findings or metastatic disease within the abdomen or pelvis. Multiple small pulmonary nodules in both lung bases, consistent with pulmonary metastases. 4.5 cm uterine fibroid."  Most recent lab results (02/18/2019) of CBC is as follows: all values are WNL except for glucose at 121, calcium at 8.7, AST at 49, and ALT at 53.  On review of systems, pt reports pedal edema and denies chest pain, shortness of breath, weight loss, and any other symptoms.  Her last known mammography was on 06/18/2012.   On Social Hx the pt reports that she is a smoker and she smokes about half a pack per day. She does not drink alcohol. She has three children: age 55, 31, and 80.  On Family Hx the pt reports lung cancer. Maternal Cousin passed from stomach cancer at the age of 34.    INTERVAL  HISTORY:  Brandy [name] is a 53 y.o. female here for evaluation and management of likely metastatic breast cancer. We are joined today by her husband, via phone. The patient's last visit with [place] was on 04/16/2019. The pt reports that she is doing well overall.  The pt reports that she experienced severe diarrhea from Thursday to Sunday, even after taking Imodium. Her husband also had diarrhea on Thursday but pt notes that they ate different food and when he stopped having diarrhea she continued for several days. Pt denies any bloody stools or mucous in her stools. She took Xanax to help with her panic attacks on Friday and Saturday. She is currently experiencing a headache and nausea. Pt feels like she has not changed her habits much within the past week but notes that she has been getting sick whether she takes Verzino or not. She is not sure if she has been drinking enough water and notes that she could be dehydrated. Pt does not currently drink any alcohol. Pt notes that she has low appetite and usually eats once per day. Last year they found that she had Diverticulosis during a Colonoscopy.   Her cough has improved and she continues to take Vitamin D. Pt has discontinued taking Paxil and only had to take Xanax during her attack last week. She also had to take Zofran once to assist with her nausea. Pt had her annual flu shot on 04/20/2019. She believes that she last had her pneumonia vaccines about 5-6 years ago at [employer] where  she used to work. Pt has been dealing with frustration and stress due to family tension.   Lab results today (05/07/19) of CBC w/diff and CMP is as follows: all values are WNL except for RBC at 3.65, Hgb at 11.5, HCT at 34.3, Neutro Abs at 1.1K, Glucose at 129, Creatinine at 1.14, AST at 72, ALT at 79, Alkaline Phosphatase at 133.  On review of systems, pt reports nausea, headache, diarrhea, stress, left flank pain and denies abdominal pain, body aches/pain,  change in urine color, dysuria, difficulty urinating, bloody stools/mucous in stools and any other symptoms.    MEDICAL HISTORY:  Past Medical History:  Diagnosis Date   Arthritis    ASD (atrial septal defect)    s/p closure with Amplatzer device 10/05/04 (Dr. Myriam Jacobson, [facility]) 10/05/04   Cataract    GERD (gastroesophageal reflux disease)    Headache    Heart murmur    no longer heard   Legally blind in right eye, as defined in [place]    Lumbar herniated disc    Sciatica   Uterine Fibroids  SURGICAL HISTORY: Past Surgical History:  Procedure Laterality Date   ABLATION     BREAST SURGERY Bilateral 2011   Breast Reduction Surgery   BUNIONECTOMY     CARDIAC CATHETERIZATION     10/05/04 [facility]): LM < 25%, otherwise normal coronaries. No pulmonary HTN, Mildly enlarged RV. Secundum ASD s/p closure.   CARDIAC SURGERY     CATARACT EXTRACTION     CLEFT PALATE REPAIR     s/p cleft lip and palate repair   LUMBAR LAMINECTOMY/DECOMPRESSION MICRODISCECTOMY Left 05/23/2016   Procedure: LEFT L4-L5 LATERAL RECESS DECOMPRESSION WITH CENTRAL AND RIGHT DECOMPRESSION VIA LEFT SIDE;  Surgeon: [name], MD;  Location: [place];  Service: Orthopedics;  Laterality: Left;   LUMBAR LAMINECTOMY/DECOMPRESSION MICRODISCECTOMY Left 05/23/2016   Procedure: LUMBAR LAMINECTOMY/DECOMPRESSION MICRODISCECTOMY Lumbar five - Sacral One 1 LEVEL;  Surgeon: [name], MD;  Location: [place];  Service: Orthopedics;  Laterality: Left;   SHOULDER INJECTION Left 05/23/2016   Procedure: SHOULDER INJECTION;  Surgeon: [name], MD;  Location: [place];  Service: Orthopedics;  Laterality: Left;  band-aid per pa-c   TRANSTHORACIC ECHOCARDIOGRAM     12/15/05 [facility]): Mild LVH, EF > 29%, grade 1 diastolic dysfunction, Trivial MR/PR/TR.  Endometrial ablation 2003 Breast Reduction, bilateral 2011  SOCIAL HISTORY: Social History   Socioeconomic History   Marital status: Married    Spouse name: Not  on file   Number of children: 3   Years of education: Not on file   Highest education level: Not on file  Occupational History   Not on file  Social Needs   Financial resource strain: Not on file   Food insecurity    Worry: Not on file    Inability: Not on file   Transportation needs    Medical: Not on file    Non-medical: Not on file  Tobacco Use   Smoking status: Current Every Day Smoker    Packs/day: 0.50    Types: Cigarettes   Smokeless tobacco: Never Used  Substance and Sexual Activity   Alcohol use: No   Drug use: No   Sexual activity: Not on file  Lifestyle   Physical activity    Days per week: Not on file    Minutes per session: Not on file   Stress: Not on file  Relationships   Social connections    Talks on phone: Not on  file    Gets together: Not on file    Attends religious service: Not on file    Active member of club or organization: Not on file    Attends meetings of clubs or organizations: Not on file    Relationship status: Not on file   Intimate partner violence    Fear of current or ex partner: Not on file    Emotionally abused: Not on file    Physically abused: Not on file    Forced sexual activity: Not on file  Other Topics Concern   Not on file  Social History Narrative   Not on file    FAMILY HISTORY: Family History  Problem Relation Age of Onset   Diabetes Mother    High blood pressure Mother    Cancer Father        Lung   Colon cancer Neg Hx    Colon polyps Neg Hx    Esophageal cancer Neg Hx    Rectal cancer Neg Hx    Stomach cancer Neg Hx     ALLERGIES:  is allergic to zithromax [azithromycin] and psyllium.  MEDICATIONS:  Current Outpatient Medications  Medication Sig Dispense Refill   abemaciclib (VERZENIO) 100 MG tablet Take 1 tablet (100 mg total) by mouth 2 (two) times daily. Swallow tablets whole. Do not chew, crush, or split tablets before swallowing. 60 tablet 2   ALPRAZolam (XANAX) 1 MG  tablet Take 1 mg by mouth daily.     amitriptyline (ELAVIL) 75 MG tablet Take 75 mg by mouth at bedtime.   6   benzonatate (TESSALON) 100 MG capsule Take 1 capsule (100 mg total) by mouth 3 (three) times daily as needed for cough. 60 capsule 0   cyclobenzaprine (FLEXERIL) 10 MG tablet Take 1 tablet (10 mg total) by mouth 2 (two) times daily as needed for muscle spasms. 14 tablet 0   ergocalciferol (VITAMIN D2) 1.25 MG (50000 UT) capsule Take 1 capsule (50,000 Units total) by mouth once a week. 12 capsule 3   HYDROcodone-Acetaminophen 7.5-300 MG TABS Take 1 tablet by mouth 3 (three) times daily as needed. 60 tablet 0   letrozole (FEMARA) 2.5 MG tablet Take 1 tablet (2.5 mg total) by mouth daily. 30 tablet 5   ondansetron (ZOFRAN) 8 MG tablet Take 1 tablet (8 mg total) by mouth every 8 (eight) hours as needed for nausea or vomiting. 30 tablet 3   PARoxetine (PAXIL) 10 MG tablet Take 10 mg by mouth daily.     Current Facility-Administered Medications  Medication Dose Route Frequency Provider Last Rate Last Dose   0.9 %  sodium chloride infusion  500 mL Intravenous Continuous Nandigam, Venia Minks, MD        REVIEW OF SYSTEMS:   A 10+ POINT REVIEW OF SYSTEMS WAS OBTAINED including neurology, dermatology, psychiatry, cardiac, respiratory, lymph, extremities, GI, GU, Musculoskeletal, constitutional, breasts, reproductive, HEENT.  All pertinent positives are noted in the HPI.  All others are negative.    PHYSICAL EXAMINATION: ECOG FS:1 - Symptomatic but completely ambulatory  Vitals:   05/07/19 1014  BP: (!) 169/78  Pulse: 98  Resp: 18  Temp: 97.8 F (36.6 C)  SpO2: 98%   Wt Readings from Last 3 Encounters:  05/07/19 223 lb 4.8 oz (101.3 kg)  04/16/19 222 lb 8 oz (100.9 kg)  03/22/19 223 lb 8 oz (101.4 kg)   Body mass index is 34.97 kg/m.    GENERAL:alert, in no acute distress and comfortable SKIN:  no acute rashes, no significant lesions EYES: conjunctiva are pink and  non-injected, sclera anicteric OROPHARYNX: MMM, no exudates, no oropharyngeal erythema or ulceration NECK: supple, no JVD LYMPH:  no palpable lymphadenopathy in the cervical, axillary or inguinal regions LUNGS: clear to auscultation b/l with normal respiratory effort HEART: regular rate & rhythm ABDOMEN:  normoactive bowel sounds, not distended. No palpable hepatosplenomegaly. Tenderness to palpation of the left flank. Extremity: no pedal edema PSYCH: alert & oriented x 3 with fluent speech NEURO: no focal motor/sensory deficits  LABORATORY DATA:  I have reviewed the data as listed  . CBC Latest Ref Rng & Units 05/07/2019 04/16/2019 02/18/2019  WBC 4.0 - 10.5 K/uL 4.8 4.0 9.1  Hemoglobin 12.0 - 15.0 g/dL 11.5(L) 11.6(L) 12.1  Hematocrit 36.0 - 46.0 % 34.3(L) 35.6(L) 38.8  Platelets 150 - 400 K/uL 339 240 377    . CMP Latest Ref Rng & Units 05/07/2019 04/16/2019 02/22/2019  Glucose 70 - 99 mg/dL 129(H) 101(H) 91  BUN 6 - 20 mg/dL 9 8 8   Creatinine 0.44 - 1.00 mg/dL 1.14(H) 1.21(H) 1.06(H)  Sodium 135 - 145 mmol/L 142 142 142  Potassium 3.5 - 5.1 mmol/L 3.8 4.2 3.9  Chloride 98 - 111 mmol/L 104 104 103  CO2 22 - 32 mmol/L 25 30 28   Calcium 8.9 - 10.3 mg/dL 9.4 9.3 9.5  Total Protein 6.5 - 8.1 g/dL 7.5 7.6 7.4  Total Bilirubin 0.3 - 1.2 mg/dL 0.3 0.5 0.4  Alkaline Phos 38 - 126 U/L 133(H) 129(H) 118  AST 15 - 41 U/L 72(H) 57(H) 39  ALT 0 - 44 U/L 79(H) 95(H) 39   Component     Latest Ref Rng & Units 03/08/2019 03/22/2019  FSH     mIU/mL 65.5   LH     mIU/mL 46.6   Estrogen     pg/mL 82   Progesterone     ng/mL <0.1   Estradiol     pg/mL  <5.0      RADIOGRAPHIC STUDIES: I have personally reviewed the radiological images as listed and agreed with the findings in the report. Dg Bone Density  Result Date: 04/09/2019 EXAM: DUAL X-RAY ABSORPTIOMETRY (DXA) FOR BONE MINERAL DENSITY IMPRESSION: Patient:             Stephnie, Parlier Birth Date:          17-Oct-1965    [age] Height / Weight:     67.0 in.    222.0 lbs. Sex / Ethnic:        Female    Black Facility ID: Referring Physician: Dr. Irene Limbo Measured:            04/09/2019 9:31:23 AM (14.10) Analyzed:            04/09/2019 9:31:54 AM (14.10) Left Forearm Bone Density BMD     Young-Adult Age-Matched Region       (g/cm2) T-score     Z-score     WHO Classification Radius UD    0.582   2.7         2.4          - Ulna UD      0.420   -           -            - Radius 33%   0.949   0.8         0.6         Normal Ulna  33%     1.009   -           -            - Both UD      0.517   -           -            - Both 33%     0.975   -           -            - Radius Total 0.767   1.5         1.2          - Ulna Total   0.723   -           -            - Both Total   0.750   -           -            - Patient:             Brandy [name], [place]oston Birth Date:          1966-02-26    [age] Height / Weight:     67.0 in.    222.0 lbs. Sex / Ethnic:        Female    Black Facility ID: Referring Physician: Dr. Irene Limbo Measured:            04/09/2019 9:27:06 AM (14.10) Analyzed:            04/09/2019 9:28:20 AM (14.10) DualFemur Bone Density BMD     Young-Adult Age-Matched Region       (g/cm2) T-score     Z-score     WHO Classification Neck Left       1.193   1.1         1.2         Normal Right      1.153   0.8         0.9         Normal Mean       1.173   1.0         1.1         Normal Difference 0.040   0.3         0.3          - Total Left       1.207   1.6         1.2         Normal Right      1.228   1.7         1.4         Normal Mean       1.217   1.7         1.3         Normal Difference 0.021   0.2         0.2          - Hip Axis Length Comparison (mm) HAL chart results unavailable Technologist:VLM Your patient Kimberlly Norgard completed a BMD test on 04/09/2019 using the [place] (analysis version: 14.10) manufactured by [employer]. The following summarizes the results of our evaluation. PATIENT BIOGRAPHICAL: Name:  Brandy [name], Brandy [name] Patient ID: [id number] Birth Date: [birth year] Height: 67.0 in. Gender: Female Exam Date: 04/09/2019 Weight: 222.0  lbs. Indications: High Risk Meds, History of Breast Cancer, History of Spinal Surgery, Tobacco User Fractures: Treatments: Letrozole, Vitamin D ASSESSMENT: The BMD measured at Femur Neck Right is 1.153 g/cm2 with a T-score of 0.8. This patient is considered normal according to [facility]) criteria. Lumbar spine was not utilized due to advanced degenerative changes. The scan quality is good. Site Region Measured Measured WHO Young Adult BMD Date       Age      Classification T-score DualFemur Neck Right 04/09/2019 54.7 Normal 0.8 1.153 g/cm2 Left Forearm Radius 33% 04/09/2019 54.7 Normal 0.8 0.949 g/cm2 World Health Organization [facility]) criteria for post-menopausal, Caucasian Women: Normal:       T-score at or above -1 SD Osteopenia:   T-score between -1 and -2.5 SD Osteoporosis: T-score at or below -2.5 SD RECOMMENDATIONS: 1. All patients should optimize calcium and vitamin D intake. 2. Consider FDA-approved medical therapies in postmenopausal women and men aged 50 years and older, based on the following: a. A hip or vertebral(clinical or morphometric) fracture b. T-score < -2.5 at the femoral neck or spine after appropriate evaluation to exclude secondary causes c. Low bone mass (T-score between -1.0 and -2.5 at the femoral neck or spine) and a 10-year probability of a hip fracture > 3% or a 10-year probability of a major osteoporosis-related fracture > 20% based on the [place]-adapted WHO algorithm d. Clinician judgment and/or patient preferences may indicate treatment for people with 10-year fracture probabilities above or below these levels FOLLOW-UP: People with diagnosed cases of osteoporosis or at high risk for fracture should have regular bone mineral density tests. For patients eligible for Medicare, routine testing is allowed once every 2 years. The testing  frequency can be increased to one year for patients who have rapidly progressing disease, those who are receiving or discontinuing medical therapy to restore bone mass, or have additional risk factors. I have reviewed this report, and agree with the above findings. [facility] Electronically Signed   By: Lowella Grip III M.D.   On: 04/09/2019 09:33    ASSESSMENT & PLAN:   Icelynn Onken is a 53 y.o. female with:  1. Newly diagnosed metastatic breast cancer ER+/PRneg/Her2 neg  02/28/2019 neck CT with results revealing "negative for mass or adenopathy in the neck."  03/04/2019 head MRI with results revealing "Negative for metastatic disease.  No acute abnormality in the brain."  2. Likely Pulmonary metastases  02/18/2019 chest and abdomen with results revealing "5cm spiculated soft tissue mass in inferior right breast, highly suspicious for primary breast carcinoma. No acute findings or metastatic disease within the abdomen or pelvis. Multiple small pulmonary nodules in both lung bases, consistent with pulmonary metastases. 4.5 cm uterine fibroid."  02/28/2019 C/A/P CT with results revealing "Irregular solid 5.0 cm right breast mass, suspicious for primary right breast malignancy. Innumerable solid pulmonary nodules scattered throughout both lungs, compatible with pulmonary metastases. No evidence of metastatic disease in the abdomen, pelvis or skeleton. Mildly enlarged and probably myomatous uterus. Simple 1.4 cm left adnexal cyst requires no follow-up. This recommendation follows ACR consensus guidelines: White Paper of the ACR Incidental Findings Committee II on Adnexal Findings. J Am Coll Radiol [phone]. Aortic Atherosclerosis (ICD10-I70.0)."  NUCLEAR MEDICINE WHOLE BODY BONE SCAN completed on 03/19/2019 with results revealing "1. No scintigraphic evidence skeletal metastasis. 2. Degenerative bone disease in the posterior elements of the upper and mid lumbar spine."    04/09/2019 Bone Density ([id number]) which revealed "The BMD measured at Femur  Neck Right is 1.153 g/cm2 with a T-score of 0.8. This patient is considered normal according to [place] [facility]) criteria. Lumbar spine was not utilized due to advanced degenerative changes. The scan quality is good. Femur Neck Right 04/09/2019 54.7 Normal 0.8 1.153 g/cm2. Left Forearm Radius 33% 04/09/2019 54.7 Normal 0.8 0.949 g/cm2."  3. Abnormal Liver function test -- previous? Fatty liver PLAN: -Discussed pt labwork today, 05/07/19; blood counts look normal, liver enzymes are elevated -Discussed that 04/16/2019 Vitamin D levels were low at 27.77 -Continue Ergocalciferol weekly  -Continue Neurontin as needed -Will rpt Bone Density Study every 1-2 years   -Advised pt to avoid OTC pain medication -Recommended that the pt continue to eat well, drink at least 48-64 oz of water each day, and walk 20-30 minutes each day.  -Advised pt that Verzino could worsen diarrhea triggered by other sources -Advised pt to hold the Verzino for 3-5 days, until her stomach feels better -The pt has no prohibitive toxicities from continuing 2.5 mg Letrozole at this time   -Will continue to watch her liver enzymes -Advised pt that elevated liver enzymes could be caused by Fatty Liver, Hepatitis or medications -Pt has had her annual flu vaccine -Will give pt Prevnar with next visit, Pneumovax 3 months after  -Will see back in 3 weeks with labs   FOLLOW UP: RTC with Dr Irene Limbo with labs in 3 weeks  The total time spent in the appt was 25 minutes and more than 50% was on counseling and direct patient cares.  All of the patient's questions were answered with apparent satisfaction. The patient knows to call the clinic with any problems, questions or concerns.   Sullivan Lone MD [place] AAHIVMS [facility] Hematology/Oncology Physician [facility]  (Office):       [phone] (Work cell):  [phone] (Fax):            [phone]  05/07/2019 10:16 AM  I, Yevette Edwards, am acting as a scribe for Dr. Sullivan Lone.   .I have reviewed the above documentation for accuracy and completeness, and I agree with the above. Brunetta Genera MD

## 2019-05-07 ENCOUNTER — Inpatient Hospital Stay (HOSPITAL_BASED_OUTPATIENT_CLINIC_OR_DEPARTMENT_OTHER): Payer: BC Managed Care – PPO | Admitting: Hematology

## 2019-05-07 ENCOUNTER — Telehealth: Payer: Self-pay | Admitting: Hematology

## 2019-05-07 ENCOUNTER — Inpatient Hospital Stay: Payer: BC Managed Care – PPO | Attending: Hematology

## 2019-05-07 ENCOUNTER — Other Ambulatory Visit: Payer: Self-pay

## 2019-05-07 VITALS — BP 169/78 | HR 98 | Temp 97.8°F | Resp 18 | Ht 67.0 in | Wt 223.3 lb

## 2019-05-07 DIAGNOSIS — D702 Other drug-induced agranulocytosis: Secondary | ICD-10-CM

## 2019-05-07 DIAGNOSIS — Z79899 Other long term (current) drug therapy: Secondary | ICD-10-CM | POA: Diagnosis not present

## 2019-05-07 DIAGNOSIS — Z833 Family history of diabetes mellitus: Secondary | ICD-10-CM | POA: Insufficient documentation

## 2019-05-07 DIAGNOSIS — R7989 Other specified abnormal findings of blood chemistry: Secondary | ICD-10-CM

## 2019-05-07 DIAGNOSIS — C7800 Secondary malignant neoplasm of unspecified lung: Secondary | ICD-10-CM

## 2019-05-07 DIAGNOSIS — Z8249 Family history of ischemic heart disease and other diseases of the circulatory system: Secondary | ICD-10-CM | POA: Insufficient documentation

## 2019-05-07 DIAGNOSIS — R945 Abnormal results of liver function studies: Secondary | ICD-10-CM | POA: Diagnosis not present

## 2019-05-07 DIAGNOSIS — R748 Abnormal levels of other serum enzymes: Secondary | ICD-10-CM | POA: Diagnosis not present

## 2019-05-07 DIAGNOSIS — R918 Other nonspecific abnormal finding of lung field: Secondary | ICD-10-CM | POA: Insufficient documentation

## 2019-05-07 DIAGNOSIS — C50911 Malignant neoplasm of unspecified site of right female breast: Secondary | ICD-10-CM

## 2019-05-07 DIAGNOSIS — I700 Atherosclerosis of aorta: Secondary | ICD-10-CM | POA: Diagnosis not present

## 2019-05-07 DIAGNOSIS — F17210 Nicotine dependence, cigarettes, uncomplicated: Secondary | ICD-10-CM | POA: Diagnosis not present

## 2019-05-07 DIAGNOSIS — Z801 Family history of malignant neoplasm of trachea, bronchus and lung: Secondary | ICD-10-CM | POA: Diagnosis not present

## 2019-05-07 LAB — CBC WITH DIFFERENTIAL/PLATELET
Abs Immature Granulocytes: 0.01 10*3/uL (ref 0.00–0.07)
Basophils Absolute: 0 10*3/uL (ref 0.0–0.1)
Basophils Relative: 1 %
Eosinophils Absolute: 0.1 10*3/uL (ref 0.0–0.5)
Eosinophils Relative: 1 %
HCT: 34.3 % — ABNORMAL LOW (ref 36.0–46.0)
Hemoglobin: 11.5 g/dL — ABNORMAL LOW (ref 12.0–15.0)
Immature Granulocytes: 0 %
Lymphocytes Relative: 70 %
Lymphs Abs: 3.4 10*3/uL (ref 0.7–4.0)
MCH: 31.5 pg (ref 26.0–34.0)
MCHC: 33.5 g/dL (ref 30.0–36.0)
MCV: 94 fL (ref 80.0–100.0)
Monocytes Absolute: 0.3 10*3/uL (ref 0.1–1.0)
Monocytes Relative: 6 %
Neutro Abs: 1.1 10*3/uL — ABNORMAL LOW (ref 1.7–7.7)
Neutrophils Relative %: 22 %
Platelets: 339 10*3/uL (ref 150–400)
RBC: 3.65 MIL/uL — ABNORMAL LOW (ref 3.87–5.11)
RDW: 14.5 % (ref 11.5–15.5)
WBC: 4.8 10*3/uL (ref 4.0–10.5)
nRBC: 0 % (ref 0.0–0.2)

## 2019-05-07 LAB — CMP (CANCER CENTER ONLY)
ALT: 79 U/L — ABNORMAL HIGH (ref 0–44)
AST: 72 U/L — ABNORMAL HIGH (ref 15–41)
Albumin: 3.6 g/dL (ref 3.5–5.0)
Alkaline Phosphatase: 133 U/L — ABNORMAL HIGH (ref 38–126)
Anion gap: 13 (ref 5–15)
BUN: 9 mg/dL (ref 6–20)
CO2: 25 mmol/L (ref 22–32)
Calcium: 9.4 mg/dL (ref 8.9–10.3)
Chloride: 104 mmol/L (ref 98–111)
Creatinine: 1.14 mg/dL — ABNORMAL HIGH (ref 0.44–1.00)
GFR, Est AFR Am: >60 mL/min (ref >60)
GFR, Estimated: 54 mL/min — ABNORMAL LOW (ref >60)
Glucose, Bld: 129 mg/dL — ABNORMAL HIGH (ref 70–99)
Potassium: 3.8 mmol/L (ref 3.5–5.1)
Sodium: 142 mmol/L (ref 135–145)
Total Bilirubin: 0.3 mg/dL (ref 0.3–1.2)
Total Protein: 7.5 g/dL (ref 6.5–8.1)

## 2019-05-07 NOTE — Telephone Encounter (Signed)
Gave avs and calendar ° °

## 2019-05-23 ENCOUNTER — Encounter: Payer: Self-pay | Admitting: Hematology

## 2019-05-28 ENCOUNTER — Other Ambulatory Visit: Payer: Self-pay | Admitting: *Deleted

## 2019-05-28 DIAGNOSIS — C50911 Malignant neoplasm of unspecified site of right female breast: Secondary | ICD-10-CM

## 2019-05-29 ENCOUNTER — Other Ambulatory Visit: Payer: Self-pay

## 2019-05-29 ENCOUNTER — Encounter: Payer: Self-pay | Admitting: Hematology

## 2019-05-29 ENCOUNTER — Inpatient Hospital Stay (HOSPITAL_BASED_OUTPATIENT_CLINIC_OR_DEPARTMENT_OTHER): Payer: BC Managed Care – PPO | Admitting: Hematology

## 2019-05-29 ENCOUNTER — Inpatient Hospital Stay: Payer: BC Managed Care – PPO

## 2019-05-29 ENCOUNTER — Telehealth: Payer: Self-pay | Admitting: Hematology

## 2019-05-29 VITALS — BP 150/65 | HR 102 | Temp 98.0°F | Resp 18 | Ht 67.0 in | Wt 227.2 lb

## 2019-05-29 DIAGNOSIS — C50911 Malignant neoplasm of unspecified site of right female breast: Secondary | ICD-10-CM | POA: Diagnosis not present

## 2019-05-29 DIAGNOSIS — R945 Abnormal results of liver function studies: Secondary | ICD-10-CM

## 2019-05-29 DIAGNOSIS — Z23 Encounter for immunization: Secondary | ICD-10-CM | POA: Diagnosis not present

## 2019-05-29 DIAGNOSIS — R7989 Other specified abnormal findings of blood chemistry: Secondary | ICD-10-CM

## 2019-05-29 LAB — CMP (CANCER CENTER ONLY)
ALT: 83 U/L — ABNORMAL HIGH (ref 0–44)
AST: 71 U/L — ABNORMAL HIGH (ref 15–41)
Albumin: 3.6 g/dL (ref 3.5–5.0)
Alkaline Phosphatase: 148 U/L — ABNORMAL HIGH (ref 38–126)
Anion gap: 12 (ref 5–15)
BUN: 16 mg/dL (ref 6–20)
CO2: 26 mmol/L (ref 22–32)
Calcium: 9.2 mg/dL (ref 8.9–10.3)
Chloride: 103 mmol/L (ref 98–111)
Creatinine: 1.24 mg/dL — ABNORMAL HIGH (ref 0.44–1.00)
GFR, Est AFR Am: 57 mL/min — ABNORMAL LOW (ref >60)
GFR, Estimated: 49 mL/min — ABNORMAL LOW (ref >60)
Glucose, Bld: 110 mg/dL — ABNORMAL HIGH (ref 70–99)
Potassium: 4 mmol/L (ref 3.5–5.1)
Sodium: 141 mmol/L (ref 135–145)
Total Bilirubin: 0.4 mg/dL (ref 0.3–1.2)
Total Protein: 7.4 g/dL (ref 6.5–8.1)

## 2019-05-29 LAB — CBC WITH DIFFERENTIAL (CANCER CENTER ONLY)
Abs Immature Granulocytes: 0.01 10*3/uL (ref 0.00–0.07)
Basophils Absolute: 0.1 10*3/uL (ref 0.0–0.1)
Basophils Relative: 1 %
Eosinophils Absolute: 0.1 10*3/uL (ref 0.0–0.5)
Eosinophils Relative: 3 %
HCT: 34.6 % — ABNORMAL LOW (ref 36.0–46.0)
Hemoglobin: 11.4 g/dL — ABNORMAL LOW (ref 12.0–15.0)
Immature Granulocytes: 0 %
Lymphocytes Relative: 61 %
Lymphs Abs: 3 10*3/uL (ref 0.7–4.0)
MCH: 32 pg (ref 26.0–34.0)
MCHC: 32.9 g/dL (ref 30.0–36.0)
MCV: 97.2 fL (ref 80.0–100.0)
Monocytes Absolute: 0.3 10*3/uL (ref 0.1–1.0)
Monocytes Relative: 6 %
Neutro Abs: 1.4 10*3/uL — ABNORMAL LOW (ref 1.7–7.7)
Neutrophils Relative %: 29 %
Platelet Count: 271 10*3/uL (ref 150–400)
RBC: 3.56 MIL/uL — ABNORMAL LOW (ref 3.87–5.11)
RDW: 15.9 % — ABNORMAL HIGH (ref 11.5–15.5)
WBC Count: 4.9 10*3/uL (ref 4.0–10.5)
nRBC: 0 % (ref 0.0–0.2)

## 2019-05-29 MED ORDER — HYDROCODONE-ACETAMINOPHEN 7.5-300 MG PO TABS: 1.0000 | ORAL_TABLET | Freq: Three times a day (TID) | ORAL | 0 refills | Status: DC | PRN

## 2019-05-29 MED ORDER — PNEUMOCOCCAL 13-VAL CONJ VACC IM SUSP
0.5000 mL | Freq: Once | INTRAMUSCULAR | Status: AC
  Administered 2019-05-29: 10:00:00 0.5 mL via INTRAMUSCULAR
  Filled 2019-05-29: qty 0.5

## 2019-05-29 NOTE — Telephone Encounter (Signed)
Scheduled per los. Patient declined printout  

## 2019-05-29 NOTE — Progress Notes (Signed)
HEMATOLOGY/ONCOLOGY CLINIC NOTE  Date of Service: 05/29/2019  Patient Care Team: [facility] as PCP - General Everardo Beals, NP as Nurse Practitioner [facility]  CHIEF COMPLAINTS/PURPOSE OF CONSULTATION:  mx of recenty diagnosed metastatic breast cancer   HISTORY OF PRESENTING ILLNESS:  Brandy [name] is a wonderful 53 y.o. female who has been referred to [place] by [facility] for evaluation and management of her suspected breast cancer.   The pt reports that she was having increasing low back pain on her right side that would not resolve, so she went to the ED. This pain has not resolved. She notes pain in her hips and knees.   Of note prior to the patient's visit today, pt has had an abdominal limited right upper ultrasound completed on 02/18/2019 with results revealing "Probable hepatic steatosis. No other abnormality seen in the right upper quadrant of the abdomen."  Pt had abdomen and pelvis CT completed on 02/18/2019 with results revealing "5cm spiculated soft tissue mass in inferior right breast, highly suspicious for primary breast carcinoma. No acute findings or metastatic disease within the abdomen or pelvis. Multiple small pulmonary nodules in both lung bases, consistent with pulmonary metastases. 4.5 cm uterine fibroid."  Most recent lab results (02/18/2019) of CBC is as follows: all values are WNL except for glucose at 121, calcium at 8.7, AST at 49, and ALT at 53.  On review of systems, pt reports pedal edema and denies chest pain, shortness of breath, weight loss, and any other symptoms.  Her last known mammography was on 06/18/2012.   On Social Hx the pt reports that she is a smoker and she smokes about half a pack per day. She does not drink alcohol. She has three children: age 16, 26, and 36.  On Family Hx the pt reports lung cancer. Maternal Cousin passed from stomach cancer at the age of 60.    INTERVAL  HISTORY:  Brandy [name] is a 54 y.o. female here for evaluation and management of likely metastatic breast cancer. We are joined today by her husband. The patient's last visit with [place] was on 05/07/2019. The pt reports that she is doing well overall.  The pt reports she has been tolerating the Verzenio.   She is taking imodium at least twice a week for diarrhea . It starts off soft and then progresses into loose stools. She experiences abdominal pain when she has episodes of diarrhea.   She is experiencing lightheadedness and dizziness when she stands up.  She has always experienced body aches, but she has noticed that it has worsened in her lower back and sides. Due to the pain, she is now using a walking stick/cane.    She has increased joint pain in her knees, elbow, and back pain. She is taking the vitamin D that was prescribed and she is taking a multivitamin  She also has some breast tenderness on the right side.   She has not smoked cigarettes in two months.    Lab results today (05/29/19) of CBC w/diff and CMP is as follows: all values are WNL except for RBC at 3.56, Hemoglobin at 11.4, hematocrit at 34.6, RDW at 15.9, Neutro Abs at 1.4, Glucose, Bld at 110, Cratinine at 1.24, AST at 71, ALT at 83, Alkaline Phosphatase at 148, GFR Est Non AF Am at 49, GFR Est AFR Am at 57.  On review of systems, pt reports hot flashes, muscle pain,  and denies abdominal  pain, new lumps or bumps, leg swelling and any other symptoms.   MEDICAL HISTORY:  Past Medical History:  Diagnosis Date  . Arthritis   . ASD (atrial septal defect)    s/p closure with Amplatzer device 10/05/04 (Dr. Myriam Jacobson, [facility]) 10/05/04  . Cataract   . GERD (gastroesophageal reflux disease)   . Headache   . Heart murmur    no longer heard  . Legally blind in right eye, as defined in [place]   . Lumbar herniated disc   . Sciatica   Uterine Fibroids  SURGICAL HISTORY: Past Surgical History:  Procedure  Laterality Date  . ABLATION    . BREAST SURGERY Bilateral 2011   Breast Reduction Surgery  . BUNIONECTOMY    . CARDIAC CATHETERIZATION     10/05/04 [facility]): LM < 25%, otherwise normal coronaries. No pulmonary HTN, Mildly enlarged RV. Secundum ASD s/p closure.  Marland Kitchen CARDIAC SURGERY    . CATARACT EXTRACTION    . CLEFT PALATE REPAIR     s/p cleft lip and palate repair  . LUMBAR LAMINECTOMY/DECOMPRESSION MICRODISCECTOMY Left 05/23/2016   Procedure: LEFT L4-L5 LATERAL RECESS DECOMPRESSION WITH CENTRAL AND RIGHT DECOMPRESSION VIA LEFT SIDE;  Surgeon: [name], MD;  Location: [place];  Service: Orthopedics;  Laterality: Left;  . LUMBAR LAMINECTOMY/DECOMPRESSION MICRODISCECTOMY Left 05/23/2016   Procedure: LUMBAR LAMINECTOMY/DECOMPRESSION MICRODISCECTOMY Lumbar five - Sacral One 1 LEVEL;  Surgeon: [name], MD;  Location: [place];  Service: Orthopedics;  Laterality: Left;  . SHOULDER INJECTION Left 05/23/2016   Procedure: SHOULDER INJECTION;  Surgeon: [name], MD;  Location: [place];  Service: Orthopedics;  Laterality: Left;  band-aid per pa-c  . TRANSTHORACIC ECHOCARDIOGRAM     12/15/05 [facility]): Mild LVH, EF > 63%, grade 1 diastolic dysfunction, Trivial MR/PR/TR.  Endometrial ablation 2003 Breast Reduction, bilateral 2011  SOCIAL HISTORY: Social History   Socioeconomic History  . Marital status: Married    Spouse name: Not on file  . Number of children: 3  . Years of education: Not on file  . Highest education level: Not on file  Occupational History  . Not on file  Tobacco Use  . Smoking status: Current Every Day Smoker    Packs/day: 0.50    Types: Cigarettes  . Smokeless tobacco: Never Used  Substance and Sexual Activity  . Alcohol use: No  . Drug use: No  . Sexual activity: Not on file  Other Topics Concern  . Not on file  Social History Narrative  . Not on file   Social Determinants of Health   Financial Resource Strain:   . Difficulty of Paying Living Expenses: Not  on file  Food Insecurity:   . Worried About Charity fundraiser in the Last Year: Not on file  . Ran Out of Food in the Last Year: Not on file  Transportation Needs:   . Lack of Transportation (Medical): Not on file  . Lack of Transportation (Non-Medical): Not on file  Physical Activity:   . Days of Exercise per Week: Not on file  . Minutes of Exercise per Session: Not on file  Stress:   . Feeling of Stress : Not on file  Social Connections:   . Frequency of Communication with Friends and Family: Not on file  . Frequency of Social Gatherings with Friends and Family: Not on file  . Attends Religious Services: Not on file  . Active Member of Clubs or Organizations: Not on file  . Attends  Club or Organization Meetings: Not on file  . Marital Status: Not on file  Intimate Partner Violence:   . Fear of Current or Ex-Partner: Not on file  . Emotionally Abused: Not on file  . Physically Abused: Not on file  . Sexually Abused: Not on file    FAMILY HISTORY: Family History  Problem Relation Age of Onset  . Diabetes Mother   . High blood pressure Mother   . Cancer Father        Lung  . Colon cancer Neg Hx   . Colon polyps Neg Hx   . Esophageal cancer Neg Hx   . Rectal cancer Neg Hx   . Stomach cancer Neg Hx     ALLERGIES:  is allergic to zithromax [azithromycin] and psyllium.  MEDICATIONS:  Current Outpatient Medications  Medication Sig Dispense Refill  . abemaciclib (VERZENIO) 100 MG tablet Take 1 tablet (100 mg total) by mouth 2 (two) times daily. Swallow tablets whole. Do not chew, crush, or split tablets before swallowing. 60 tablet 2  . ALPRAZolam (XANAX) 1 MG tablet Take 1 mg by mouth daily.    Marland Kitchen amitriptyline (ELAVIL) 75 MG tablet Take 75 mg by mouth at bedtime.   6  . benzonatate (TESSALON) 100 MG capsule Take 1 capsule (100 mg total) by mouth 3 (three) times daily as needed for cough. 60 capsule 0  . cyclobenzaprine (FLEXERIL) 10 MG tablet Take 1 tablet (10 mg total)  by mouth 2 (two) times daily as needed for muscle spasms. 14 tablet 0  . ergocalciferol (VITAMIN D2) 1.25 MG (50000 UT) capsule Take 1 capsule (50,000 Units total) by mouth once a week. 12 capsule 3  . HYDROcodone-Acetaminophen 7.5-300 MG TABS Take 1 tablet by mouth 3 (three) times daily as needed. 60 tablet 0  . letrozole (FEMARA) 2.5 MG tablet Take 1 tablet (2.5 mg total) by mouth daily. 30 tablet 5  . ondansetron (ZOFRAN) 8 MG tablet Take 1 tablet (8 mg total) by mouth every 8 (eight) hours as needed for nausea or vomiting. 30 tablet 3  . PARoxetine (PAXIL) 10 MG tablet Take 10 mg by mouth daily.     Current Facility-Administered Medications  Medication Dose Route Frequency Provider Last Rate Last Admin  . 0.9 %  sodium chloride infusion  500 mL Intravenous Continuous Nandigam, Venia Minks, MD        REVIEW OF SYSTEMS:   A 10+ POINT REVIEW OF SYSTEMS WAS OBTAINED including neurology, dermatology, psychiatry, cardiac, respiratory, lymph, extremities, GI, GU, Musculoskeletal, constitutional, breasts, reproductive, HEENT.  All pertinent positives are noted in the HPI.  All others are negative.     PHYSICAL EXAMINATION: ECOG FS:1 - Symptomatic but completely ambulatory  Vitals:   05/29/19 0902  BP: (!) 150/65  Pulse: (!) 102  Resp: 18  Temp: 98 F (36.7 C)  SpO2: 100%   Wt Readings from Last 3 Encounters:  05/29/19 227 lb 3.2 oz (103.1 kg)  05/07/19 223 lb 4.8 oz (101.3 kg)  04/16/19 222 lb 8 oz (100.9 kg)   Body mass index is 35.58 kg/m.    GENERAL:alert, in no acute distress and comfortable SKIN: no acute rashes, no significant lesions EYES: conjunctiva are pink and non-injected, sclera anicteric OROPHARYNX: MMM, no exudates, no oropharyngeal erythema or ulceration NECK: supple, no JVD LYMPH:  no palpable lymphadenopathy in the cervical, axillary or inguinal regions LUNGS: clear to auscultation b/l with normal respiratory effort HEART: regular rate & rhythm ABDOMEN:   normoactive bowel  sounds , non tender, not distended. Extremity: no pedal edema PSYCH: alert & oriented x 3 with fluent speech NEURO: no focal motor/sensory deficits   LABORATORY DATA:  I have reviewed the data as listed  . CBC Latest Ref Rng & Units 05/29/2019 05/07/2019 04/16/2019  WBC 4.0 - 10.5 K/uL 4.9 4.8 4.0  Hemoglobin 12.0 - 15.0 g/dL 11.4(L) 11.5(L) 11.6(L)  Hematocrit 36.0 - 46.0 % 34.6(L) 34.3(L) 35.6(L)  Platelets 150 - 400 K/uL 271 339 240    . CMP Latest Ref Rng & Units 05/29/2019 05/07/2019 04/16/2019  Glucose 70 - 99 mg/dL 110(H) 129(H) 101(H)  BUN 6 - 20 mg/dL 16 9 8   Creatinine 0.44 - 1.00 mg/dL 1.24(H) 1.14(H) 1.21(H)  Sodium 135 - 145 mmol/L 141 142 142  Potassium 3.5 - 5.1 mmol/L 4.0 3.8 4.2  Chloride 98 - 111 mmol/L 103 104 104  CO2 22 - 32 mmol/L 26 25 30   Calcium 8.9 - 10.3 mg/dL 9.2 9.4 9.3  Total Protein 6.5 - 8.1 g/dL 7.4 7.5 7.6  Total Bilirubin 0.3 - 1.2 mg/dL 0.4 0.3 0.5  Alkaline Phos 38 - 126 U/L 148(H) 133(H) 129(H)  AST 15 - 41 U/L 71(H) 72(H) 57(H)  ALT 0 - 44 U/L 83(H) 79(H) 95(H)   Component     Latest Ref Rng & Units 03/08/2019 03/22/2019  FSH     mIU/mL 65.5   LH     mIU/mL 46.6   Estrogen     pg/mL 82   Progesterone     ng/mL <0.1   Estradiol     pg/mL  <5.0      RADIOGRAPHIC STUDIES: I have personally reviewed the radiological images as listed and agreed with the findings in the report. No results found.  ASSESSMENT & PLAN:   Brandy [name] is a 53 y.o. female with:  1. Newly diagnosed metastatic breast cancer ER+/PRneg/Her2 neg  02/28/2019 neck CT with results revealing "negative for mass or adenopathy in the neck."  03/04/2019 head MRI with results revealing "Negative for metastatic disease.  No acute abnormality in the brain."  2. Likely Pulmonary metastases  02/18/2019 chest and abdomen with results revealing "5cm spiculated soft tissue mass in inferior right breast, highly suspicious for primary breast  carcinoma. No acute findings or metastatic disease within the abdomen or pelvis. Multiple small pulmonary nodules in both lung bases, consistent with pulmonary metastases. 4.5 cm uterine fibroid."  02/28/2019 C/A/P CT with results revealing "Irregular solid 5.0 cm right breast mass, suspicious for primary right breast malignancy. Innumerable solid pulmonary nodules scattered throughout both lungs, compatible with pulmonary metastases. No evidence of metastatic disease in the abdomen, pelvis or skeleton. Mildly enlarged and probably myomatous uterus. Simple 1.4 cm left adnexal cyst requires no follow-up. This recommendation follows ACR consensus guidelines: White Paper of the ACR Incidental Findings Committee II on Adnexal Findings. J Am Coll Radiol [phone]. Aortic Atherosclerosis (ICD10-I70.0)."  NUCLEAR MEDICINE WHOLE BODY BONE SCAN completed on 03/19/2019 with results revealing "1. No scintigraphic evidence skeletal metastasis. 2. Degenerative bone disease in the posterior elements of the upper and mid lumbar spine."   04/09/2019 Bone Density ([id number]) which revealed "The BMD measured at Femur Neck Right is 1.153 g/cm2 with a T-score of 0.8. This patient is considered normal according to [facility]) criteria. Lumbar spine was not utilized due to advanced degenerative changes. The scan quality is good. Femur Neck Right 04/09/2019 54.7 Normal 0.8 1.153 g/cm2. Left Forearm Radius 33% 04/09/2019 54.7 Normal 0.8 0.949  g/cm2."  3. Abnormal Liver function test -- previous? Fatty liver  PLAN: -Discussed pt labwork today, 05/29/19;  all values are WNL except for RBC at 3.56, Hemoglobin at 11.4, hematocrit at 34.6, RDW at 15.9, Neutro Abs at 1.4, Glucose, Bld at 110, Creatiinine at 1.24, AST at 71, ALT at 83, Alkaline Phosphatase at 148, GFR Est Non AF Am at 49, GFR Est AFR Am at 57. -Discussed that Verzenio can cause hot flashes -Advised that staying active will help with her  joint pain.  -Advised that tiredness, fatigue, and bowel changes can all result from quitting nicotine. -05/29/19 wbc at 4.9 -05/29/19 Potassium at 4.o -05/29/19 Creatinine at 1.24  -Recommended to limit alcohol consumption for liver concerns due to fatty liver and to stay close to recommended body weight to help with joint pain. -Recommended steam for help loosen mucus and secretions and recommended the use of a humidifier for mouth dryness at night.  -Suggested to eat more leafy greens and eating less processed foods.  -Will refill Hydrocodone    -Will see back in one month and repeat scans in 2-3 months. -Injection appointment today for Prevnar   -Will continue letrozole and will continue the current dosage of Verzenio -Grade one diarrhea related to Verzenio, continue imodium prn -Will continue calcium and vitiamin D replacement as needed   FOLLOW UP: Return to clinic with Dr. Irene Limbo with labs in 4 weeks Pneumovax in 2 months Prevnar today  The total time spent in the appt was 25 minutes and more than 50% was on counseling and direct patient cares.  All of the patient's questions were answered with apparent satisfaction. The patient knows to call the clinic with any problems, questions or concerns.   Sullivan Lone MD MS AAHIVMS [facility] Hematology/Oncology Physician [facility]  (Office):       [phone] (Work cell):  [phone] (Fax):           [phone]  05/29/2019 5:44 AM  I, Scot Dock, am acting as a scribe for Dr. Sullivan Lone.   .I have reviewed the above documentation for accuracy and completeness, and I agree with the above. Brunetta Genera MD

## 2019-05-29 NOTE — Patient Instructions (Signed)
Pneumococcal Conjugate Vaccine (PCV13): What You Need to Know 1. Why get vaccinated? Pneumococcal conjugate vaccine (PCV13) can prevent pneumococcal disease. Pneumococcal disease refers to any illness caused by pneumococcal bacteria. These bacteria can cause many types of illnesses, including pneumonia, which is an infection of the lungs. Pneumococcal bacteria are one of the most common causes of pneumonia. Besides pneumonia, pneumococcal bacteria can also cause:  Ear infections  Sinus infections  Meningitis (infection of the tissue covering the brain and spinal cord)  Bacteremia (bloodstream infection) Anyone can get pneumococcal disease, but children under 2 years of age, people with certain medical conditions, adults 65 years or older, and cigarette smokers are at the highest risk. Most pneumococcal infections are mild. However, some can result in long-term problems, such as brain damage or hearing loss. Meningitis, bacteremia, and pneumonia caused by pneumococcal disease can be fatal. 2. PCV13 PCV13 protects against 13 types of bacteria that cause pneumococcal disease. Infants and young children usually need 4 doses of pneumococcal conjugate vaccine, at 2, 4, 6, and 12-15 months of age. In some cases, a child might need fewer than 4 doses to complete PCV13 vaccination. A dose of PCV23 vaccine is also recommended for anyone 2 years or older with certain medical conditions if they did not already receive PCV13. This vaccine may be given to adults 65 years or older based on discussions between the patient and health care provider. 3. Talk with your health care provider Tell your vaccine provider if the person getting the vaccine:  Has had an allergic reaction after a previous dose of PCV13, to an earlier pneumococcal conjugate vaccine known as PCV7, or to any vaccine containing diphtheria toxoid (for example, DTaP), or has any severe, life-threatening allergies.  In some cases, your health  care provider may decide to postpone PCV13 vaccination to a future visit. People with minor illnesses, such as a cold, may be vaccinated. People who are moderately or severely ill should usually wait until they recover before getting PCV13. Your health care provider can give you more information. 4. Risks of a vaccine reaction  Redness, swelling, pain, or tenderness where the shot is given, and fever, loss of appetite, fussiness (irritability), feeling tired, headache, and chills can happen after PCV13. Young children may be at increased risk for seizures caused by fever after PCV13 if it is administered at the same time as inactivated influenza vaccine. Ask your health care provider for more information. People sometimes faint after medical procedures, including vaccination. Tell your provider if you feel dizzy or have vision changes or ringing in the ears. As with any medicine, there is a very remote chance of a vaccine causing a severe allergic reaction, other serious injury, or death. 5. What if there is a serious problem? An allergic reaction could occur after the vaccinated person leaves the clinic. If you see signs of a severe allergic reaction (hives, swelling of the face and throat, difficulty breathing, a fast heartbeat, dizziness, or weakness), call 9-1-1 and get the person to the nearest hospital. For other signs that concern you, call your health care provider. Adverse reactions should be reported to the Vaccine Adverse Event Reporting System (VAERS). Your health care provider will usually file this report, or you can do it yourself. Visit the VAERS website at www.vaers.hhs.gov or call 1-800-822-7967. VAERS is only for reporting reactions, and VAERS staff do not give medical advice. 6. The National Vaccine Injury Compensation Program The National Vaccine Injury Compensation Program (VICP) is a federal program   that was created to compensate people who may have been injured by certain  vaccines. Visit the VICP website at GoldCloset.com.ee or call 502-091-5138 to learn about the program and about filing a claim. There is a time limit to file a claim for compensation. 7. How can I learn more?  Ask your health care provider.  Call your local or state health department.  Contact the Centers for Disease Control and Prevention (CDC): ? Call 249 778 4202 (1-800-CDC-INFO) or ? Visit CDC's website at http://hunter.com/ Vaccine Information Statement PCV13 Vaccine (04/04/2018) This information is not intended to replace advice given to you by your health care provider. Make sure you discuss any questions you have with your health care provider. Document Released: 03/20/2006 Document Revised: 09/11/2018 Document Reviewed: 01/02/2018 Elsevier Patient Education  2020 [employer].

## 2019-06-05 ENCOUNTER — Telehealth: Payer: Self-pay | Admitting: *Deleted

## 2019-06-05 ENCOUNTER — Encounter: Payer: Self-pay | Admitting: Hematology

## 2019-06-05 ENCOUNTER — Other Ambulatory Visit: Payer: Self-pay | Admitting: Medical

## 2019-06-05 DIAGNOSIS — R05 Cough: Secondary | ICD-10-CM

## 2019-06-05 DIAGNOSIS — R059 Cough, unspecified: Secondary | ICD-10-CM

## 2019-06-05 NOTE — Telephone Encounter (Signed)
Contacted patient in response to MyChart question. Patient states coughing kept her awake last night. States she is having coughing spells off and on. States she is coughing up "a lot of phlegm" at times. Nyquil provides some relief.  Asked if she had contacted PCP, she stated she wanted to call Dr.Kale's office first because her PCP office always tells her to call oncologist if symptoms are related to lungs.  Information given to Sandi Mealy, PA in [facility]. Appt made for [facility] 12/31 at 1:30pm with CXR prior to appt. Contacted patient with times of appts. She verbalized understanding.

## 2019-06-06 ENCOUNTER — Inpatient Hospital Stay (HOSPITAL_BASED_OUTPATIENT_CLINIC_OR_DEPARTMENT_OTHER): Payer: BC Managed Care – PPO | Admitting: Medical

## 2019-06-06 ENCOUNTER — Ambulatory Visit (HOSPITAL_COMMUNITY)
Admission: RE | Admit: 2019-06-06 | Discharge: 2019-06-06 | Disposition: A | Discharge status: Home / Self Care | Payer: BC Managed Care – PPO | Source: Ambulatory Visit | Attending: Medical | Admitting: Medical

## 2019-06-06 ENCOUNTER — Other Ambulatory Visit: Payer: Self-pay

## 2019-06-06 VITALS — BP 166/78 | HR 109 | Temp 98.0°F | Resp 20 | Ht 67.0 in | Wt 226.7 lb

## 2019-06-06 DIAGNOSIS — C50919 Malignant neoplasm of unspecified site of unspecified female breast: Secondary | ICD-10-CM

## 2019-06-06 DIAGNOSIS — R05 Cough: Secondary | ICD-10-CM | POA: Insufficient documentation

## 2019-06-06 DIAGNOSIS — R059 Cough, unspecified: Secondary | ICD-10-CM

## 2019-06-06 DIAGNOSIS — C50911 Malignant neoplasm of unspecified site of right female breast: Secondary | ICD-10-CM | POA: Diagnosis not present

## 2019-06-06 MED ORDER — HYDROCOD POLST-CPM POLST ER 10-8 MG/5ML PO SUER: 5.0000 mL | Freq: Every evening | ORAL | 0 refills | Status: DC | PRN

## 2019-06-06 MED ORDER — SULFAMETHOXAZOLE-TRIMETHOPRIM 800-160 MG PO TABS: 1.0000 | ORAL_TABLET | Freq: Two times a day (BID) | ORAL | 0 refills | Status: DC

## 2019-06-06 MED ORDER — ALBUTEROL SULFATE HFA 108 (90 BASE) MCG/ACT IN AERS: 2.0000 | INHALATION_SPRAY | Freq: Four times a day (QID) | RESPIRATORY_TRACT | 5 refills | Status: DC | PRN

## 2019-06-06 MED ORDER — FLOVENT HFA 110 MCG/ACT IN AERO: 2.0000 | INHALATION_SPRAY | Freq: Two times a day (BID) | RESPIRATORY_TRACT | 5 refills | Status: DC

## 2019-06-06 NOTE — Progress Notes (Signed)
These results were reviewed with Dr. Benay Spice and with the patient.

## 2019-06-06 NOTE — Patient Instructions (Signed)

## 2019-06-08 NOTE — Progress Notes (Signed)
[facility] Progress Note   Brandy [name] [id number] 11-08-1965 54 y.o.  Brandy [name] is managed by Dr. Sullivan Lone  Actively treated with chemotherapy/immunotherapy/hormonal therapy: yes  Current therapy: Verzenio and letrozole  Next scheduled appointment with provider: 06/26/2019  Assessment: Plan:    Cough - Plan: omeprazole (PRILOSEC) 10 MG capsule, sulfamethoxazole-trimethoprim (BACTRIM DS) 800-160 MG tablet, chlorpheniramine-HYDROcodone (TUSSIONEX) 10-8 MG/5ML SUER, albuterol (VENTOLIN HFA) 108 (90 Base) MCG/ACT inhaler, fluticasone (FLOVENT HFA) 110 MCG/ACT inhaler  Metastatic breast cancer (HCC)   Cough: Chest x-ray was completed and returned showing: FINDINGS: No consolidative process, pneumothorax or effusion is identified. Multiple small pulmonary nodules seen on prior CT scan are difficult to visualize on this study.  Heart size is normal.  Atrial septal closure device is noted.  IMPRESSION: No acute disease.  Multiple small pulmonary nodule seen on prior CT scan are difficult to visualize on this study.  She was given a prescription for Bactrim DS p.o. twice daily x7 days, Tussionex, and albuterol inhaler and a Flovent inhaler.  She will also continue on Prilosec 10 mg once daily.   Metastatic breast cancer: The patient continues to be followed by Dr. Sullivan Lone and is being treated with Verzenio and letrozole.  She will return as scheduled on 06/26/2019.   Please see After Visit Summary for patient specific instructions.  Future Appointments  Date Time Provider [place]  06/26/2019  8:45 AM [facility] ONLY [facility] None  06/26/2019  9:20 AM Brunetta Genera, MD [facility] None  07/29/2019  9:30 AM [facility] [place] FLUSH [facility] None    No orders of the defined types were placed in this encounter.      Subjective:   Patient ID:  Brandy [name] is a 54 y.o. (DOB 02-Mar-1966) female.  Chief Complaint:    Chief Complaint  Patient presents with  . Cough    HPI Brandy [name]  Is a 54 y.o. female with a diagnosis of a metastatic breast cancer. The patient continues to be followed by Dr. Sullivan Lone and is being treated with Verzenio and letrozole.  She presents to the clinic today with increasing cough over the last few days.  Her cough is waking her up at night.  She has been using Tessalon Perles and NyQuil without good relief.  She has been producing thick mucus and notes that her throat is irritated.  She describes the mucus as being yellow to gray and thick.  She reports having some postnasal drainage.  She denies nausea, vomiting, diarrhea, fevers, chills, sweats, chest pains, or shortness of breath.  Medications: I have reviewed the patient's current medications.  Allergies:  Allergies  Allergen Reactions  . Zithromax [Azithromycin] Shortness Of Breath and Itching    TOTAL BODY ITCHING [EVEN SOLES OF FEET] WHEEZING   . Psyllium Nausea And Vomiting    Past Medical History:  Diagnosis Date  . Arthritis   . ASD (atrial septal defect)    s/p closure with Amplatzer device 10/05/04 (Dr. Myriam Jacobson, [facility]) 10/05/04  . Cataract   . GERD (gastroesophageal reflux disease)   . Headache   . Heart murmur    no longer heard  . Legally blind in right eye, as defined in [place]   . Lumbar herniated disc   . Sciatica     Past Surgical History:  Procedure Laterality Date  . ABLATION    . BREAST SURGERY Bilateral 2011   Breast Reduction Surgery  . BUNIONECTOMY    . CARDIAC  CATHETERIZATION     10/05/04 [facility]): LM < 25%, otherwise normal coronaries. No pulmonary HTN, Mildly enlarged RV. Secundum ASD s/p closure.  Marland Kitchen CARDIAC SURGERY    . CATARACT EXTRACTION    . CLEFT PALATE REPAIR     s/p cleft lip and palate repair  . LUMBAR LAMINECTOMY/DECOMPRESSION MICRODISCECTOMY Left 05/23/2016   Procedure: LEFT L4-L5 LATERAL RECESS DECOMPRESSION WITH CENTRAL AND RIGHT DECOMPRESSION VIA LEFT  SIDE;  Surgeon: [name], MD;  Location: [place];  Service: Orthopedics;  Laterality: Left;  . LUMBAR LAMINECTOMY/DECOMPRESSION MICRODISCECTOMY Left 05/23/2016   Procedure: LUMBAR LAMINECTOMY/DECOMPRESSION MICRODISCECTOMY Lumbar five - Sacral One 1 LEVEL;  Surgeon: [name], MD;  Location: [place];  Service: Orthopedics;  Laterality: Left;  . SHOULDER INJECTION Left 05/23/2016   Procedure: SHOULDER INJECTION;  Surgeon: [name], MD;  Location: [place];  Service: Orthopedics;  Laterality: Left;  band-aid per pa-c  . TRANSTHORACIC ECHOCARDIOGRAM     12/15/05 [facility]): Mild LVH, EF > [id number], grade 1 diastolic dysfunction, Trivial MR/PR/TR.    Family History  Problem Relation Age of Onset  . Diabetes Mother   . High blood pressure Mother   . Cancer Father        Lung  . Colon cancer Neg Hx   . Colon polyps Neg Hx   . Esophageal cancer Neg Hx   . Rectal cancer Neg Hx   . Stomach cancer Neg Hx     Social History   Socioeconomic History  . Marital status: Married    Spouse name: Not on file  . Number of children: 3  . Years of education: Not on file  . Highest education level: Not on file  Occupational History  . Not on file  Tobacco Use  . Smoking status: Current Every Day Smoker    Packs/day: 0.50    Types: Cigarettes  . Smokeless tobacco: Never Used  Substance and Sexual Activity  . Alcohol use: No  . Drug use: No  . Sexual activity: Not on file  Other Topics Concern  . Not on file  Social History Narrative  . Not on file   Social Determinants of Health   Financial Resource Strain:   . Difficulty of Paying Living Expenses: Not on file  Food Insecurity:   . Worried About Charity fundraiser in the Last Year: Not on file  . Ran Out of Food in the Last Year: Not on file  Transportation Needs:   . Lack of Transportation (Medical): Not on file  . Lack of Transportation (Non-Medical): Not on file  Physical Activity:   . Days of Exercise per Week: Not on file  .  Minutes of Exercise per Session: Not on file  Stress:   . Feeling of Stress : Not on file  Social Connections:   . Frequency of Communication with Friends and Family: Not on file  . Frequency of Social Gatherings with Friends and Family: Not on file  . Attends Religious Services: Not on file  . Active Member of Clubs or Organizations: Not on file  . Attends Archivist Meetings: Not on file  . Marital Status: Not on file  Intimate Partner Violence:   . Fear of Current or Ex-Partner: Not on file  . Emotionally Abused: Not on file  . Physically Abused: Not on file  . Sexually Abused: Not on file    Past Medical History, Surgical history, Social history, and Family history were reviewed and updated as appropriate.  Please see review of systems for further details on the patient's review from today.   Review of Systems:  Review of Systems  Constitutional: Negative for chills, diaphoresis and fever.  HENT: Positive for congestion, postnasal drip and sore throat. Negative for trouble swallowing.   Respiratory: Positive for cough. Negative for choking, chest tightness, shortness of breath, wheezing and stridor.   Cardiovascular: Negative for chest pain and palpitations.  Psychiatric/Behavioral: Positive for sleep disturbance.    Objective:   Physical Exam:  BP (!) 166/78 (BP Location: Right Arm, Patient Position: Sitting) Comment: Liza RN was notified  Pulse (!) 109 Comment: Audiological scientist was notified  Temp 98 F (36.7 C) (Temporal)   Resp 20   Ht 5\' 7"  (1.702 m)   Wt 226 lb 11.2 oz (102.8 kg)   SpO2 97%   BMI 35.51 kg/m  ECOG: 0  Physical Exam Constitutional:      General: She is not in acute distress.    Appearance: She is not ill-appearing, toxic-appearing or diaphoretic.  Cardiovascular:     Rate and Rhythm: Normal rate and regular rhythm.     Heart sounds: No murmur. No gallop.   Pulmonary:     Effort: Pulmonary effort is normal. No respiratory distress.      Breath sounds: Normal breath sounds. No wheezing, rhonchi or rales.  Skin:    General: Skin is warm and dry.  Neurological:     Gait: Gait normal.  Psychiatric:        Mood and Affect: Mood normal.        Behavior: Behavior normal.        Thought Content: Thought content normal.        Judgment: Judgment normal.     Lab Review:     Component Value Date/Time   NA 141 05/29/2019 0823   K 4.0 05/29/2019 0823   CL 103 05/29/2019 0823   CO2 26 05/29/2019 0823   GLUCOSE 110 (H) 05/29/2019 0823   BUN 16 05/29/2019 0823   CREATININE 1.24 (H) 05/29/2019 0823   CALCIUM 9.2 05/29/2019 0823   PROT 7.4 05/29/2019 0823   ALBUMIN 3.6 05/29/2019 0823   AST 71 (H) 05/29/2019 0823   ALT 83 (H) 05/29/2019 0823   ALKPHOS 148 (H) 05/29/2019 0823   BILITOT 0.4 05/29/2019 0823   GFRNONAA 49 (L) 05/29/2019 0823   GFRAA 57 (L) 05/29/2019 0823       Component Value Date/Time   WBC 4.9 05/29/2019 0823   WBC 4.8 05/07/2019 0902   RBC 3.56 (L) 05/29/2019 0823   HGB 11.4 (L) 05/29/2019 0823   HCT 34.6 (L) 05/29/2019 0823   PLT 271 05/29/2019 0823   MCV 97.2 05/29/2019 0823   MCH 32.0 05/29/2019 0823   MCHC 32.9 05/29/2019 0823   RDW 15.9 (H) 05/29/2019 0823   LYMPHSABS 3.0 05/29/2019 0823   MONOABS 0.3 05/29/2019 0823   EOSABS 0.1 05/29/2019 0823   BASOSABS 0.1 05/29/2019 0823   -------------------------------  Imaging from last 24 hours (if applicable):  Radiology interpretation: DG Chest 2 View  Result Date: 06/06/2019 CLINICAL DATA:  History of metastatic breast cancer. Cough for 1 week. The patient is currently undergoing chemotherapy. EXAM: CHEST - 2 VIEW COMPARISON:  CT chest, abdomen and pelvis 02/28/2019. FINDINGS: No consolidative process, pneumothorax or effusion is identified. Multiple small pulmonary nodules seen on prior CT scan are difficult to visualize on this study. Heart size is normal. Atrial septal closure device is noted. IMPRESSION: No acute  disease. Multiple small  pulmonary nodule seen on prior CT scan are difficult to visualize on this study. Electronically Signed   By: Inge Rise M.D.   On: 06/06/2019 13:52

## 2019-06-25 ENCOUNTER — Other Ambulatory Visit: Payer: Self-pay | Admitting: Hematology

## 2019-06-25 NOTE — Progress Notes (Signed)
HEMATOLOGY/ONCOLOGY CLINIC NOTE  Date of Service: 06/26/2019  Patient Care Team: [facility] as PCP - General Everardo Beals, NP as Nurse Practitioner [facility]  CHIEF COMPLAINTS/PURPOSE OF CONSULTATION:  -f/u for metastatic breast cancer   HISTORY OF PRESENTING ILLNESS:  Brandy [name] is a wonderful 54 y.o. female who has been referred to [place] by [facility] for evaluation and management of her suspected breast cancer.   The pt reports that she was having increasing low back pain on her right side that would not resolve, so she went to the ED. This pain has not resolved. She notes pain in her hips and knees.   Of note prior to the patient's visit today, pt has had an abdominal limited right upper ultrasound completed on 02/18/2019 with results revealing "Probable hepatic steatosis. No other abnormality seen in the right upper quadrant of the abdomen."  Pt had abdomen and pelvis CT completed on 02/18/2019 with results revealing "5cm spiculated soft tissue mass in inferior right breast, highly suspicious for primary breast carcinoma. No acute findings or metastatic disease within the abdomen or pelvis. Multiple small pulmonary nodules in both lung bases, consistent with pulmonary metastases. 4.5 cm uterine fibroid."  Most recent lab results (02/18/2019) of CBC is as follows: all values are WNL except for glucose at 121, calcium at 8.7, AST at 49, and ALT at 53.  On review of systems, pt reports pedal edema and denies chest pain, shortness of breath, weight loss, and any other symptoms.  Her last known mammography was on 06/18/2012.   On Social Hx the pt reports that she is a smoker and she smokes about half a pack per day. She does not drink alcohol. She has three children: age 75, 16, and 55.  On Family Hx the pt reports lung cancer. Maternal Cousin passed from stomach cancer at the age of 35.    INTERVAL HISTORY:  Brandy [name]  is a 54 y.o. female here for evaluation and management of likely metastatic breast cancer. The patient's last visit with [place] was on 05/29/2019. The pt reports that she is doing well overall.  The pt reports that she has seen Sandi Mealy PA-C in the interim for cough with thick sputum. She was given antibiotics and inhaler. Her cough has since disappeared.   Pt was having headaches with nausea last week and vomited after eating. She has a history of migraines but they have not been frequent recently. Her last migraine lasted about 5 days. Pt had diarrhea on Sunday. She is using Imodium as needed and only has diarrhea about once per month at this time. Pt believes that the lump in her right breast has decreased in size. She denies any pain or discomfort in her breast. She has been tired the last few weeks and has not had the energy to workout.   Lab results today (06/26/19) of CBC w/diff and CMP is as follows: all values are WNL except for RBC at 3.53, Hgb at 11.5, HCT at 35.2, RDW at 16.2, Neutro Abs at 1.4K, Glucose at 130, Creatinine at 1.20, AST at 97, ALT at 151, ALP at 162, GFR Est Af Am at 51.  06/26/2019 Vitamin D 25-hydroxy at 30.51  On review of systems, pt reports nausea, vomiting, migraines, improving diarrhea, fatigue and denies leg swelling, abdominal pain, cough, breast pain and any other symptoms.   MEDICAL HISTORY:  Past Medical History:  Diagnosis Date  . Arthritis   .  ASD (atrial septal defect)    s/p closure with Amplatzer device 10/05/04 (Dr. Myriam Jacobson, [facility]) 10/05/04  . Cataract   . GERD (gastroesophageal reflux disease)   . Headache   . Heart murmur    no longer heard  . Legally blind in right eye, as defined in [place]   . Lumbar herniated disc   . Sciatica   Uterine Fibroids  SURGICAL HISTORY: Past Surgical History:  Procedure Laterality Date  . ABLATION    . BREAST SURGERY Bilateral 2011   Breast Reduction Surgery  . BUNIONECTOMY    . CARDIAC CATHETERIZATION      10/05/04 [facility]): LM < 25%, otherwise normal coronaries. No pulmonary HTN, Mildly enlarged RV. Secundum ASD s/p closure.  Marland Kitchen CARDIAC SURGERY    . CATARACT EXTRACTION    . CLEFT PALATE REPAIR     s/p cleft lip and palate repair  . LUMBAR LAMINECTOMY/DECOMPRESSION MICRODISCECTOMY Left 05/23/2016   Procedure: LEFT L4-L5 LATERAL RECESS DECOMPRESSION WITH CENTRAL AND RIGHT DECOMPRESSION VIA LEFT SIDE;  Surgeon: [name], MD;  Location: [place];  Service: Orthopedics;  Laterality: Left;  . LUMBAR LAMINECTOMY/DECOMPRESSION MICRODISCECTOMY Left 05/23/2016   Procedure: LUMBAR LAMINECTOMY/DECOMPRESSION MICRODISCECTOMY Lumbar five - Sacral One 1 LEVEL;  Surgeon: [name], MD;  Location: [place];  Service: Orthopedics;  Laterality: Left;  . SHOULDER INJECTION Left 05/23/2016   Procedure: SHOULDER INJECTION;  Surgeon: [name], MD;  Location: [place];  Service: Orthopedics;  Laterality: Left;  band-aid per pa-c  . TRANSTHORACIC ECHOCARDIOGRAM     12/15/05 [facility]): Mild LVH, EF > 40%, grade 1 diastolic dysfunction, Trivial MR/PR/TR.  Endometrial ablation 2003 Breast Reduction, bilateral 2011  SOCIAL HISTORY: Social History   Socioeconomic History  . Marital status: Married    Spouse name: Not on file  . Number of children: 3  . Years of education: Not on file  . Highest education level: Not on file  Occupational History  . Not on file  Tobacco Use  . Smoking status: Current Every Day Smoker    Packs/day: 0.50    Types: Cigarettes  . Smokeless tobacco: Never Used  Substance and Sexual Activity  . Alcohol use: No  . Drug use: No  . Sexual activity: Not on file  Other Topics Concern  . Not on file  Social History Narrative  . Not on file   Social Determinants of Health   Financial Resource Strain:   . Difficulty of Paying Living Expenses: Not on file  Food Insecurity:   . Worried About Charity fundraiser in the Last Year: Not on file  . Ran Out of Food in the Last Year: Not on  file  Transportation Needs:   . Lack of Transportation (Medical): Not on file  . Lack of Transportation (Non-Medical): Not on file  Physical Activity:   . Days of Exercise per Week: Not on file  . Minutes of Exercise per Session: Not on file  Stress:   . Feeling of Stress : Not on file  Social Connections:   . Frequency of Communication with Friends and Family: Not on file  . Frequency of Social Gatherings with Friends and Family: Not on file  . Attends Religious Services: Not on file  . Active Member of Clubs or Organizations: Not on file  . Attends Archivist Meetings: Not on file  . Marital Status: Not on file  Intimate Partner Violence:   . Fear of Current or Ex-Partner: Not on  file  . Emotionally Abused: Not on file  . Physically Abused: Not on file  . Sexually Abused: Not on file    FAMILY HISTORY: Family History  Problem Relation Age of Onset  . Diabetes Mother   . High blood pressure Mother   . Cancer Father        Lung  . Colon cancer Neg Hx   . Colon polyps Neg Hx   . Esophageal cancer Neg Hx   . Rectal cancer Neg Hx   . Stomach cancer Neg Hx     ALLERGIES:  is allergic to zithromax [azithromycin] and psyllium.  MEDICATIONS:  Current Outpatient Medications  Medication Sig Dispense Refill  . albuterol (VENTOLIN HFA) 108 (90 Base) MCG/ACT inhaler Inhale 2 puffs into the lungs every 6 (six) hours as needed for wheezing or shortness of breath. 18 g 5  . ALPRAZolam (XANAX) 1 MG tablet Take 1 mg by mouth daily.    Marland Kitchen amitriptyline (ELAVIL) 75 MG tablet Take 75 mg by mouth at bedtime.   6  . benzonatate (TESSALON) 100 MG capsule Take 1 capsule (100 mg total) by mouth 3 (three) times daily as needed for cough. 60 capsule 0  . chlorpheniramine-HYDROcodone (TUSSIONEX) 10-8 MG/5ML SUER Take 5 mLs by mouth at bedtime as needed for cough. 140 mL 0  . cyclobenzaprine (FLEXERIL) 10 MG tablet Take 1 tablet (10 mg total) by mouth 2 (two) times daily as needed for  muscle spasms. 14 tablet 0  . ergocalciferol (VITAMIN D2) 1.25 MG (50000 UT) capsule Take 1 capsule (50,000 Units total) by mouth once a week. 12 capsule 3  . fluticasone (FLOVENT HFA) 110 MCG/ACT inhaler Inhale 2 puffs into the lungs 2 (two) times daily. 1 Inhaler 5  . HYDROcodone-Acetaminophen 7.5-300 MG TABS Take 1 tablet by mouth 3 (three) times daily as needed. 60 tablet 0  . letrozole (FEMARA) 2.5 MG tablet Take 1 tablet (2.5 mg total) by mouth daily. 30 tablet 5  . omeprazole (PRILOSEC) 10 MG capsule Take by mouth.    . ondansetron (ZOFRAN) 8 MG tablet Take 1 tablet (8 mg total) by mouth every 8 (eight) hours as needed for nausea or vomiting. 30 tablet 3  . VERZENIO 100 MG tablet TAKE 1 TABLET (100 MG TOTAL) BY MOUTH 2 (TWO) TIMES DAILY. SWALLOW TABLETS WHOLE. DO NOT CHEW, CRUSH, OR SPLIT TABLETS BEFORE SWALLOWING. 28 tablet 2  . sulfamethoxazole-trimethoprim (BACTRIM DS) 800-160 MG tablet Take 1 tablet by mouth 2 (two) times daily. 14 tablet 0   Current Facility-Administered Medications  Medication Dose Route Frequency Provider Last Rate Last Admin  . 0.9 %  sodium chloride infusion  500 mL Intravenous Continuous Nandigam, Venia Minks, MD        REVIEW OF SYSTEMS:   A 10+ POINT REVIEW OF SYSTEMS WAS OBTAINED including neurology, dermatology, psychiatry, cardiac, respiratory, lymph, extremities, GI, GU, Musculoskeletal, constitutional, breasts, reproductive, HEENT.  All pertinent positives are noted in the HPI.  All others are negative.    PHYSICAL EXAMINATION: ECOG FS:1 - Symptomatic but completely ambulatory  Vitals:   06/26/19 0922  BP: (!) 153/73  Pulse: (!) 101  Resp: 17  Temp: 97.7 F (36.5 C)  SpO2: 98%   Wt Readings from Last 3 Encounters:  06/26/19 227 lb 12.8 oz (103.3 kg)  06/06/19 226 lb 11.2 oz (102.8 kg)  05/29/19 227 lb 3.2 oz (103.1 kg)   Body mass index is 35.68 kg/m.    GENERAL:alert, in no acute distress and  comfortable SKIN: no acute rashes, no  significant lesions EYES: conjunctiva are pink and non-injected, sclera anicteric OROPHARYNX: MMM, no exudates, no oropharyngeal erythema or ulceration NECK: supple, no JVD LYMPH:  no palpable lymphadenopathy in the cervical, axillary or inguinal regions LUNGS: clear to auscultation b/l with normal respiratory effort HEART: regular rate & rhythm ABDOMEN:  normoactive bowel sounds , non tender, not distended. No palpable hepatosplenomegaly.  Extremity: no pedal edema PSYCH: alert & oriented x 3 with fluent speech NEURO: no focal motor/sensory deficits  LABORATORY DATA:  I have reviewed the data as listed  . CBC Latest Ref Rng & Units 06/26/2019 05/29/2019 05/07/2019  WBC 4.0 - 10.5 K/uL 4.9 4.9 4.8  Hemoglobin 12.0 - 15.0 g/dL 11.5(L) 11.4(L) 11.5(L)  Hematocrit 36.0 - 46.0 % 35.2(L) 34.6(L) 34.3(L)  Platelets 150 - 400 K/uL 266 271 339    . CMP Latest Ref Rng & Units 06/26/2019 05/29/2019 05/07/2019  Glucose 70 - 99 mg/dL 130(H) 110(H) 129(H)  BUN 6 - 20 mg/dL 10 16 9   Creatinine 0.44 - 1.00 mg/dL 1.20(H) 1.24(H) 1.14(H)  Sodium 135 - 145 mmol/L 139 141 142  Potassium 3.5 - 5.1 mmol/L 4.3 4.0 3.8  Chloride 98 - 111 mmol/L 102 103 104  CO2 22 - 32 mmol/L 27 26 25   Calcium 8.9 - 10.3 mg/dL 9.1 9.2 9.4  Total Protein 6.5 - 8.1 g/dL 7.7 7.4 7.5  Total Bilirubin 0.3 - 1.2 mg/dL 0.5 0.4 0.3  Alkaline Phos 38 - 126 U/L 162(H) 148(H) 133(H)  AST 15 - 41 U/L 97(H) 71(H) 72(H)  ALT 0 - 44 U/L 151(H) 83(H) 79(H)   Component     Latest Ref Rng & Units 03/08/2019 03/22/2019  FSH     mIU/mL 65.5   LH     mIU/mL 46.6   Estrogen     pg/mL 82   Progesterone     ng/mL <0.1   Estradiol     pg/mL  <5.0      RADIOGRAPHIC STUDIES: I have personally reviewed the radiological images as listed and agreed with the findings in the report. DG Chest 2 View  Result Date: 06/06/2019 CLINICAL DATA:  History of metastatic breast cancer. Cough for 1 week. The patient is currently undergoing  chemotherapy. EXAM: CHEST - 2 VIEW COMPARISON:  CT chest, abdomen and pelvis 02/28/2019. FINDINGS: No consolidative process, pneumothorax or effusion is identified. Multiple small pulmonary nodules seen on prior CT scan are difficult to visualize on this study. Heart size is normal. Atrial septal closure device is noted. IMPRESSION: No acute disease. Multiple small pulmonary nodule seen on prior CT scan are difficult to visualize on this study. Electronically Signed   By: Inge Rise M.D.   On: 06/06/2019 13:52    ASSESSMENT & PLAN:   Brandy [name] is a 54 y.o. female with:  1. Metastatic breast cancer ER+/PRneg/Her2 neg  02/28/2019 neck CT with results revealing "negative for mass or adenopathy in the neck."  03/04/2019 head MRI with results revealing "Negative for metastatic disease.  No acute abnormality in the brain."  2. Likely Pulmonary metastases  02/18/2019 chest and abdomen with results revealing "5cm spiculated soft tissue mass in inferior right breast, highly suspicious for primary breast carcinoma. No acute findings or metastatic disease within the abdomen or pelvis. Multiple small pulmonary nodules in both lung bases, consistent with pulmonary metastases. 4.5 cm uterine fibroid."  02/28/2019 C/A/P CT with results revealing "Irregular solid 5.0 cm right breast mass, suspicious for primary right  breast malignancy. Innumerable solid pulmonary nodules scattered throughout both lungs, compatible with pulmonary metastases. No evidence of metastatic disease in the abdomen, pelvis or skeleton. Mildly enlarged and probably myomatous uterus. Simple 1.4 cm left adnexal cyst requires no follow-up. This recommendation follows ACR consensus guidelines: White Paper of the ACR Incidental Findings Committee II on Adnexal Findings. J Am Coll Radiol [phone]. Aortic Atherosclerosis (ICD10-I70.0)."  NUCLEAR MEDICINE WHOLE BODY BONE SCAN completed on 03/19/2019 with results revealing "1.  No scintigraphic evidence skeletal metastasis. 2. Degenerative bone disease in the posterior elements of the upper and mid lumbar spine."   04/09/2019 Bone Density ([id number]) which revealed "The BMD measured at Femur Neck Right is 1.153 g/cm2 with a T-score of 0.8. This patient is considered normal according to [facility]) criteria. Lumbar spine was not utilized due to advanced degenerative changes. The scan quality is good. Femur Neck Right 04/09/2019 54.7 Normal 0.8 1.153 g/cm2. Left Forearm Radius 33% 04/09/2019 54.7 Normal 0.8 0.949 g/cm2."  3. Abnormal Liver function test -- previous? Fatty liver  PLAN: -Discussed pt labwork today, 06/26/19; blood counts look good, stable mild neutropenia, elevated liver enzymes -Discussed 06/26/2019 Vitamin D 25-hydroxy is WNL at 30.51 -Continue Imodium prn -Continue Calcium and VitaminD replacement as needed  -Advised pt that Letrozole and Verzenio can cause liver enzyme abnormalities  -Will consider holding Verzenio and continuing Letrozole, or switching both medications depending on Liver US and subsequent labs -Will get Abd [place] in 10 days to assess liver function abnormalities   -Will get labs in 10 days -Will see back in 2 weeks via phone   FOLLOW UP: Labs and [place] abd in 10 days Phone visit in 14 days   The total time spent in the appt was 20 minutes and more than 50% was on counseling and direct patient cares.  All of the patient's questions were answered with apparent satisfaction. The patient knows to call the clinic with any problems, questions or concerns.    Sullivan Lone MD [place] AAHIVMS [facility] Hematology/Oncology Physician [facility]  (Office):       [phone] (Work cell):  [phone] (Fax):           [phone]  06/26/2019 1:52 PM  I, Yevette Edwards, am acting as a scribe for Dr. Sullivan Lone.   .I have reviewed the above documentation for accuracy and completeness, and I agree with the  above. Brunetta Genera MD

## 2019-06-26 ENCOUNTER — Inpatient Hospital Stay: Payer: BC Managed Care – PPO

## 2019-06-26 ENCOUNTER — Other Ambulatory Visit: Payer: Self-pay

## 2019-06-26 ENCOUNTER — Inpatient Hospital Stay: Payer: BC Managed Care – PPO | Attending: Hematology | Admitting: Hematology

## 2019-06-26 VITALS — BP 153/73 | HR 101 | Temp 97.7°F | Resp 17 | Ht 67.0 in | Wt 227.8 lb

## 2019-06-26 DIAGNOSIS — R911 Solitary pulmonary nodule: Secondary | ICD-10-CM | POA: Diagnosis not present

## 2019-06-26 DIAGNOSIS — C50919 Malignant neoplasm of unspecified site of unspecified female breast: Secondary | ICD-10-CM

## 2019-06-26 DIAGNOSIS — R945 Abnormal results of liver function studies: Secondary | ICD-10-CM

## 2019-06-26 DIAGNOSIS — R112 Nausea with vomiting, unspecified: Secondary | ICD-10-CM | POA: Insufficient documentation

## 2019-06-26 DIAGNOSIS — R519 Headache, unspecified: Secondary | ICD-10-CM | POA: Insufficient documentation

## 2019-06-26 DIAGNOSIS — F17210 Nicotine dependence, cigarettes, uncomplicated: Secondary | ICD-10-CM | POA: Insufficient documentation

## 2019-06-26 DIAGNOSIS — M545 Low back pain: Secondary | ICD-10-CM | POA: Diagnosis not present

## 2019-06-26 DIAGNOSIS — M25551 Pain in right hip: Secondary | ICD-10-CM | POA: Insufficient documentation

## 2019-06-26 DIAGNOSIS — M25552 Pain in left hip: Secondary | ICD-10-CM | POA: Diagnosis not present

## 2019-06-26 DIAGNOSIS — C50911 Malignant neoplasm of unspecified site of right female breast: Secondary | ICD-10-CM | POA: Insufficient documentation

## 2019-06-26 DIAGNOSIS — D702 Other drug-induced agranulocytosis: Secondary | ICD-10-CM | POA: Diagnosis not present

## 2019-06-26 DIAGNOSIS — Z79899 Other long term (current) drug therapy: Secondary | ICD-10-CM | POA: Diagnosis not present

## 2019-06-26 DIAGNOSIS — Z801 Family history of malignant neoplasm of trachea, bronchus and lung: Secondary | ICD-10-CM | POA: Insufficient documentation

## 2019-06-26 DIAGNOSIS — R7989 Other specified abnormal findings of blood chemistry: Secondary | ICD-10-CM

## 2019-06-26 DIAGNOSIS — Z833 Family history of diabetes mellitus: Secondary | ICD-10-CM | POA: Insufficient documentation

## 2019-06-26 DIAGNOSIS — R197 Diarrhea, unspecified: Secondary | ICD-10-CM | POA: Insufficient documentation

## 2019-06-26 DIAGNOSIS — Z8249 Family history of ischemic heart disease and other diseases of the circulatory system: Secondary | ICD-10-CM | POA: Insufficient documentation

## 2019-06-26 LAB — CMP (CANCER CENTER ONLY)
ALT: 151 U/L — ABNORMAL HIGH (ref 0–44)
AST: 97 U/L — ABNORMAL HIGH (ref 15–41)
Albumin: 3.5 g/dL (ref 3.5–5.0)
Alkaline Phosphatase: 162 U/L — ABNORMAL HIGH (ref 38–126)
Anion gap: 10 (ref 5–15)
BUN: 10 mg/dL (ref 6–20)
CO2: 27 mmol/L (ref 22–32)
Calcium: 9.1 mg/dL (ref 8.9–10.3)
Chloride: 102 mmol/L (ref 98–111)
Creatinine: 1.2 mg/dL — ABNORMAL HIGH (ref 0.44–1.00)
GFR, Est AFR Am: 59 mL/min — ABNORMAL LOW (ref >60)
GFR, Estimated: 51 mL/min — ABNORMAL LOW (ref >60)
Glucose, Bld: 130 mg/dL — ABNORMAL HIGH (ref 70–99)
Potassium: 4.3 mmol/L (ref 3.5–5.1)
Sodium: 139 mmol/L (ref 135–145)
Total Bilirubin: 0.5 mg/dL (ref 0.3–1.2)
Total Protein: 7.7 g/dL (ref 6.5–8.1)

## 2019-06-26 LAB — CBC WITH DIFFERENTIAL/PLATELET
Abs Immature Granulocytes: 0.01 10*3/uL (ref 0.00–0.07)
Basophils Absolute: 0 10*3/uL (ref 0.0–0.1)
Basophils Relative: 1 %
Eosinophils Absolute: 0.1 10*3/uL (ref 0.0–0.5)
Eosinophils Relative: 2 %
HCT: 35.2 % — ABNORMAL LOW (ref 36.0–46.0)
Hemoglobin: 11.5 g/dL — ABNORMAL LOW (ref 12.0–15.0)
Immature Granulocytes: 0 %
Lymphocytes Relative: 63 %
Lymphs Abs: 3.1 10*3/uL (ref 0.7–4.0)
MCH: 32.6 pg (ref 26.0–34.0)
MCHC: 32.7 g/dL (ref 30.0–36.0)
MCV: 99.7 fL (ref 80.0–100.0)
Monocytes Absolute: 0.2 10*3/uL (ref 0.1–1.0)
Monocytes Relative: 5 %
Neutro Abs: 1.4 10*3/uL — ABNORMAL LOW (ref 1.7–7.7)
Neutrophils Relative %: 29 %
Platelets: 266 10*3/uL (ref 150–400)
RBC: 3.53 MIL/uL — ABNORMAL LOW (ref 3.87–5.11)
RDW: 16.2 % — ABNORMAL HIGH (ref 11.5–15.5)
WBC: 4.9 10*3/uL (ref 4.0–10.5)
nRBC: 0 % (ref 0.0–0.2)

## 2019-06-26 LAB — VITAMIN D 25 HYDROXY (VIT D DEFICIENCY, FRACTURES): Vit D, 25-Hydroxy: 30.51 ng/mL (ref 30–100)

## 2019-06-27 ENCOUNTER — Telehealth: Payer: Self-pay | Admitting: Hematology

## 2019-06-27 NOTE — Telephone Encounter (Signed)
Scheduled appt per 1/20 los.  Spoke with pt and she is aware of her appt date and time

## 2019-06-29 ENCOUNTER — Ambulatory Visit: Payer: BC Managed Care – PPO | Attending: Internal Medicine

## 2019-06-29 DIAGNOSIS — Z23 Encounter for immunization: Secondary | ICD-10-CM | POA: Insufficient documentation

## 2019-07-01 ENCOUNTER — Encounter: Payer: Self-pay | Admitting: Hematology

## 2019-07-03 ENCOUNTER — Other Ambulatory Visit: Payer: Self-pay

## 2019-07-03 ENCOUNTER — Ambulatory Visit (HOSPITAL_COMMUNITY)
Admission: RE | Admit: 2019-07-03 | Discharge: 2019-07-03 | Disposition: A | Discharge status: Home / Self Care | Payer: BC Managed Care – PPO | Source: Ambulatory Visit | Attending: Hematology | Admitting: Hematology

## 2019-07-03 DIAGNOSIS — R945 Abnormal results of liver function studies: Secondary | ICD-10-CM | POA: Diagnosis present

## 2019-07-03 DIAGNOSIS — R7989 Other specified abnormal findings of blood chemistry: Secondary | ICD-10-CM

## 2019-07-03 DIAGNOSIS — C50919 Malignant neoplasm of unspecified site of unspecified female breast: Secondary | ICD-10-CM | POA: Diagnosis not present

## 2019-07-05 ENCOUNTER — Other Ambulatory Visit: Payer: Self-pay

## 2019-07-05 ENCOUNTER — Inpatient Hospital Stay: Payer: BC Managed Care – PPO

## 2019-07-05 DIAGNOSIS — C50911 Malignant neoplasm of unspecified site of right female breast: Secondary | ICD-10-CM | POA: Diagnosis not present

## 2019-07-05 DIAGNOSIS — C50919 Malignant neoplasm of unspecified site of unspecified female breast: Secondary | ICD-10-CM

## 2019-07-05 DIAGNOSIS — R945 Abnormal results of liver function studies: Secondary | ICD-10-CM

## 2019-07-05 DIAGNOSIS — R7989 Other specified abnormal findings of blood chemistry: Secondary | ICD-10-CM

## 2019-07-05 LAB — CMP (CANCER CENTER ONLY)
ALT: 93 U/L — ABNORMAL HIGH (ref 0–44)
AST: 54 U/L — ABNORMAL HIGH (ref 15–41)
Albumin: 3.6 g/dL (ref 3.5–5.0)
Alkaline Phosphatase: 149 U/L — ABNORMAL HIGH (ref 38–126)
Anion gap: 9 (ref 5–15)
BUN: 14 mg/dL (ref 6–20)
CO2: 28 mmol/L (ref 22–32)
Calcium: 9 mg/dL (ref 8.9–10.3)
Chloride: 102 mmol/L (ref 98–111)
Creatinine: 1.34 mg/dL — ABNORMAL HIGH (ref 0.44–1.00)
GFR, Est AFR Am: 52 mL/min — ABNORMAL LOW (ref >60)
GFR, Estimated: 45 mL/min — ABNORMAL LOW (ref >60)
Glucose, Bld: 120 mg/dL — ABNORMAL HIGH (ref 70–99)
Potassium: 3.9 mmol/L (ref 3.5–5.1)
Sodium: 139 mmol/L (ref 135–145)
Total Bilirubin: 0.7 mg/dL (ref 0.3–1.2)
Total Protein: 7.5 g/dL (ref 6.5–8.1)

## 2019-07-05 LAB — GAMMA GT: GGT: 340 U/L — ABNORMAL HIGH (ref 7–50)

## 2019-07-10 ENCOUNTER — Inpatient Hospital Stay: Payer: BC Managed Care – PPO | Attending: Hematology | Admitting: Hematology

## 2019-07-10 DIAGNOSIS — R197 Diarrhea, unspecified: Secondary | ICD-10-CM | POA: Diagnosis not present

## 2019-07-10 DIAGNOSIS — R05 Cough: Secondary | ICD-10-CM | POA: Diagnosis not present

## 2019-07-10 DIAGNOSIS — Z8249 Family history of ischemic heart disease and other diseases of the circulatory system: Secondary | ICD-10-CM | POA: Insufficient documentation

## 2019-07-10 DIAGNOSIS — Z79811 Long term (current) use of aromatase inhibitors: Secondary | ICD-10-CM | POA: Insufficient documentation

## 2019-07-10 DIAGNOSIS — I700 Atherosclerosis of aorta: Secondary | ICD-10-CM | POA: Insufficient documentation

## 2019-07-10 DIAGNOSIS — Z801 Family history of malignant neoplasm of trachea, bronchus and lung: Secondary | ICD-10-CM | POA: Diagnosis not present

## 2019-07-10 DIAGNOSIS — Z23 Encounter for immunization: Secondary | ICD-10-CM | POA: Insufficient documentation

## 2019-07-10 DIAGNOSIS — G479 Sleep disorder, unspecified: Secondary | ICD-10-CM | POA: Insufficient documentation

## 2019-07-10 DIAGNOSIS — N6310 Unspecified lump in the right breast, unspecified quadrant: Secondary | ICD-10-CM | POA: Insufficient documentation

## 2019-07-10 DIAGNOSIS — C7802 Secondary malignant neoplasm of left lung: Secondary | ICD-10-CM | POA: Diagnosis present

## 2019-07-10 DIAGNOSIS — Z79899 Other long term (current) drug therapy: Secondary | ICD-10-CM | POA: Insufficient documentation

## 2019-07-10 DIAGNOSIS — Z833 Family history of diabetes mellitus: Secondary | ICD-10-CM | POA: Diagnosis not present

## 2019-07-10 DIAGNOSIS — M25552 Pain in left hip: Secondary | ICD-10-CM | POA: Diagnosis not present

## 2019-07-10 DIAGNOSIS — C50919 Malignant neoplasm of unspecified site of unspecified female breast: Secondary | ICD-10-CM

## 2019-07-10 DIAGNOSIS — Z881 Allergy status to other antibiotic agents status: Secondary | ICD-10-CM | POA: Diagnosis not present

## 2019-07-10 DIAGNOSIS — R0602 Shortness of breath: Secondary | ICD-10-CM | POA: Insufficient documentation

## 2019-07-10 DIAGNOSIS — C7801 Secondary malignant neoplasm of right lung: Secondary | ICD-10-CM | POA: Insufficient documentation

## 2019-07-10 DIAGNOSIS — F17210 Nicotine dependence, cigarettes, uncomplicated: Secondary | ICD-10-CM | POA: Diagnosis not present

## 2019-07-10 DIAGNOSIS — Z8774 Personal history of (corrected) congenital malformations of heart and circulatory system: Secondary | ICD-10-CM | POA: Insufficient documentation

## 2019-07-10 DIAGNOSIS — R945 Abnormal results of liver function studies: Secondary | ICD-10-CM

## 2019-07-10 DIAGNOSIS — M25473 Effusion, unspecified ankle: Secondary | ICD-10-CM | POA: Diagnosis not present

## 2019-07-10 DIAGNOSIS — K760 Fatty (change of) liver, not elsewhere classified: Secondary | ICD-10-CM | POA: Diagnosis not present

## 2019-07-10 DIAGNOSIS — R600 Localized edema: Secondary | ICD-10-CM | POA: Insufficient documentation

## 2019-07-10 DIAGNOSIS — M25551 Pain in right hip: Secondary | ICD-10-CM | POA: Diagnosis not present

## 2019-07-10 DIAGNOSIS — R7989 Other specified abnormal findings of blood chemistry: Secondary | ICD-10-CM

## 2019-07-10 NOTE — Progress Notes (Signed)
HEMATOLOGY/ONCOLOGY CLINIC NOTE  Date of Service: 07/10/2019  Patient Care Team: [facility] as PCP - General Everardo Beals, NP as Nurse Practitioner [facility]  CHIEF COMPLAINTS/PURPOSE OF CONSULTATION:  -f/u for metastatic breast cancer   HISTORY OF PRESENTING ILLNESS:  Brandy [name] is a wonderful 54 y.o. female who has been referred to [place] by [facility] for evaluation and management of her suspected breast cancer.   The pt reports that she was having increasing low back pain on her right side that would not resolve, so she went to the ED. This pain has not resolved. She notes pain in her hips and knees.   Of note prior to the patient's visit today, pt has had an abdominal limited right upper ultrasound completed on 02/18/2019 with results revealing "Probable hepatic steatosis. No other abnormality seen in the right upper quadrant of the abdomen."  Pt had abdomen and pelvis CT completed on 02/18/2019 with results revealing "5cm spiculated soft tissue mass in inferior right breast, highly suspicious for primary breast carcinoma. No acute findings or metastatic disease within the abdomen or pelvis. Multiple small pulmonary nodules in both lung bases, consistent with pulmonary metastases. 4.5 cm uterine fibroid."  Most recent lab results (02/18/2019) of CBC is as follows: all values are WNL except for glucose at 121, calcium at 8.7, AST at 49, and ALT at 53.  On review of systems, pt reports pedal edema and denies chest pain, shortness of breath, weight loss, and any other symptoms.  Her last known mammography was on 06/18/2012.   On Social Hx the pt reports that she is a smoker and she smokes about half a pack per day. She does not drink alcohol. She has three children: age 76, 7, and 58.  On Family Hx the pt reports lung cancer. Maternal Cousin passed from stomach cancer at the age of 17.    INTERVAL HISTORY:  I connected with   Murvin Donning on 07/10/19 by telephone and verified that I am speaking with the correct person using two identifiers.   I discussed the limitations of evaluation and management by telemedicine. The patient expressed understanding and agreed to proceed.  Other persons participating in the visit and their role in the encounter:     -Yevette Edwards, Medical Scribe  Patient's location: Home Provider's location: [place] [facility] is a 54 y.o. female here for evaluation and management of likely metastatic breast cancer. The patient's last visit with [place] was on 06/26/2019. The pt reports that she is doing well overall.  The pt reports that two days last week she had diarrhea even after taking Imodium. Pt has continued her smoking cessation for about 4 months. She has noticed some increase SOB when climbing stairs. Pt is taking Flovent regularly and is using her rescue inhaler about twice per week. She still has a cough but it appears to be due to sinuses. She has been taking OTC antihistamines to treat. Pt is having some difficulty sleeping but notes that her sleep patterns have been off for some time. She has also noticed some ankle swelling but wears compression socks and elevates her feet. Pt held Verzinio for five days after our last visit.    Of note since the patient's last visit, pt has had [place] Abd ([id number]) completed on 07/03/2019 with results revealing "1. No acute findings.  Normal gallbladder.  No bile duct dilation. 2. Significant increased liver parenchymal echogenicity consistent  with extensive hepatic steatosis."  Lab results (07/05/19) of CMP is as follows: all values are WNL except for Glucose at 120, Creatinine at 1.34, AST at 54, ALT at 93, ALP at 149, GFR Est Af Am at 52.  On review of systems, pt reports diarrhea, improving cough, SOB, ankle swelling, sleeplessness and denies breast discomfort, and any other symptoms.    MEDICAL HISTORY:  Past Medical  History:  Diagnosis Date  . Arthritis   . ASD (atrial septal defect)    s/p closure with Amplatzer device 10/05/04 (Dr. Myriam Jacobson, [facility]) 10/05/04  . Cataract   . GERD (gastroesophageal reflux disease)   . Headache   . Heart murmur    no longer heard  . Legally blind in right eye, as defined in [place]   . Lumbar herniated disc   . Sciatica   Uterine Fibroids  SURGICAL HISTORY: Past Surgical History:  Procedure Laterality Date  . ABLATION    . BREAST SURGERY Bilateral 2011   Breast Reduction Surgery  . BUNIONECTOMY    . CARDIAC CATHETERIZATION     10/05/04 [facility]): LM < 25%, otherwise normal coronaries. No pulmonary HTN, Mildly enlarged RV. Secundum ASD s/p closure.  Marland Kitchen CARDIAC SURGERY    . CATARACT EXTRACTION    . CLEFT PALATE REPAIR     s/p cleft lip and palate repair  . LUMBAR LAMINECTOMY/DECOMPRESSION MICRODISCECTOMY Left 05/23/2016   Procedure: LEFT L4-L5 LATERAL RECESS DECOMPRESSION WITH CENTRAL AND RIGHT DECOMPRESSION VIA LEFT SIDE;  Surgeon: [name], MD;  Location: [place];  Service: Orthopedics;  Laterality: Left;  . LUMBAR LAMINECTOMY/DECOMPRESSION MICRODISCECTOMY Left 05/23/2016   Procedure: LUMBAR LAMINECTOMY/DECOMPRESSION MICRODISCECTOMY Lumbar five - Sacral One 1 LEVEL;  Surgeon: [name], MD;  Location: [place];  Service: Orthopedics;  Laterality: Left;  . SHOULDER INJECTION Left 05/23/2016   Procedure: SHOULDER INJECTION;  Surgeon: [name], MD;  Location: [place];  Service: Orthopedics;  Laterality: Left;  band-aid per pa-c  . TRANSTHORACIC ECHOCARDIOGRAM     12/15/05 [facility]): Mild LVH, EF > 44%, grade 1 diastolic dysfunction, Trivial MR/PR/TR.  Endometrial ablation 2003 Breast Reduction, bilateral 2011  SOCIAL HISTORY: Social History   Socioeconomic History  . Marital status: Married    Spouse name: Not on file  . Number of children: 3  . Years of education: Not on file  . Highest education level: Not on file  Occupational History  . Not on  file  Tobacco Use  . Smoking status: Current Every Day Smoker    Packs/day: 0.50    Types: Cigarettes  . Smokeless tobacco: Never Used  Substance and Sexual Activity  . Alcohol use: No  . Drug use: No  . Sexual activity: Not on file  Other Topics Concern  . Not on file  Social History Narrative  . Not on file   Social Determinants of Health   Financial Resource Strain:   . Difficulty of Paying Living Expenses: Not on file  Food Insecurity:   . Worried About Charity fundraiser in the Last Year: Not on file  . Ran Out of Food in the Last Year: Not on file  Transportation Needs:   . Lack of Transportation (Medical): Not on file  . Lack of Transportation (Non-Medical): Not on file  Physical Activity:   . Days of Exercise per Week: Not on file  . Minutes of Exercise per Session: Not on file  Stress:   . Feeling of Stress : Not on  file  Social Connections:   . Frequency of Communication with Friends and Family: Not on file  . Frequency of Social Gatherings with Friends and Family: Not on file  . Attends Religious Services: Not on file  . Active Member of Clubs or Organizations: Not on file  . Attends Archivist Meetings: Not on file  . Marital Status: Not on file  Intimate Partner Violence:   . Fear of Current or Ex-Partner: Not on file  . Emotionally Abused: Not on file  . Physically Abused: Not on file  . Sexually Abused: Not on file    FAMILY HISTORY: Family History  Problem Relation Age of Onset  . Diabetes Mother   . High blood pressure Mother   . Cancer Father        Lung  . Colon cancer Neg Hx   . Colon polyps Neg Hx   . Esophageal cancer Neg Hx   . Rectal cancer Neg Hx   . Stomach cancer Neg Hx     ALLERGIES:  is allergic to zithromax [azithromycin] and psyllium.  MEDICATIONS:  Current Outpatient Medications  Medication Sig Dispense Refill  . albuterol (VENTOLIN HFA) 108 (90 Base) MCG/ACT inhaler Inhale 2 puffs into the lungs every 6 (six)  hours as needed for wheezing or shortness of breath. 18 g 5  . ALPRAZolam (XANAX) 1 MG tablet Take 1 mg by mouth daily.    Marland Kitchen amitriptyline (ELAVIL) 75 MG tablet Take 75 mg by mouth at bedtime.   6  . benzonatate (TESSALON) 100 MG capsule Take 1 capsule (100 mg total) by mouth 3 (three) times daily as needed for cough. 60 capsule 0  . chlorpheniramine-HYDROcodone (TUSSIONEX) 10-8 MG/5ML SUER Take 5 mLs by mouth at bedtime as needed for cough. 140 mL 0  . cyclobenzaprine (FLEXERIL) 10 MG tablet Take 1 tablet (10 mg total) by mouth 2 (two) times daily as needed for muscle spasms. 14 tablet 0  . ergocalciferol (VITAMIN D2) 1.25 MG (50000 UT) capsule Take 1 capsule (50,000 Units total) by mouth once a week. 12 capsule 3  . fluticasone (FLOVENT HFA) 110 MCG/ACT inhaler Inhale 2 puffs into the lungs 2 (two) times daily. 1 Inhaler 5  . HYDROcodone-Acetaminophen 7.5-300 MG TABS Take 1 tablet by mouth 3 (three) times daily as needed. 60 tablet 0  . letrozole (FEMARA) 2.5 MG tablet Take 1 tablet (2.5 mg total) by mouth daily. 30 tablet 5  . omeprazole (PRILOSEC) 10 MG capsule Take by mouth.    . ondansetron (ZOFRAN) 8 MG tablet Take 1 tablet (8 mg total) by mouth every 8 (eight) hours as needed for nausea or vomiting. 30 tablet 3  . sulfamethoxazole-trimethoprim (BACTRIM DS) 800-160 MG tablet Take 1 tablet by mouth 2 (two) times daily. 14 tablet 0  . VERZENIO 100 MG tablet TAKE 1 TABLET (100 MG TOTAL) BY MOUTH 2 (TWO) TIMES DAILY. SWALLOW TABLETS WHOLE. DO NOT CHEW, CRUSH, OR SPLIT TABLETS BEFORE SWALLOWING. 28 tablet 2   Current Facility-Administered Medications  Medication Dose Route Frequency Provider Last Rate Last Admin  . 0.9 %  sodium chloride infusion  500 mL Intravenous Continuous Nandigam, Venia Minks, MD        REVIEW OF SYSTEMS:   A 10+ POINT REVIEW OF SYSTEMS WAS OBTAINED including neurology, dermatology, psychiatry, cardiac, respiratory, lymph, extremities, GI, GU, Musculoskeletal,  constitutional, breasts, reproductive, HEENT.  All pertinent positives are noted in the HPI.  All others are negative.    PHYSICAL EXAMINATION: ECOG  FS:1 - Symptomatic but completely ambulatory  There were no vitals filed for this visit. Wt Readings from Last 3 Encounters:  06/26/19 227 lb 12.8 oz (103.3 kg)  06/06/19 226 lb 11.2 oz (102.8 kg)  05/29/19 227 lb 3.2 oz (103.1 kg)   There is no height or weight on file to calculate BMI.    Telehealth  LABORATORY DATA:  I have reviewed the data as listed  . CBC Latest Ref Rng & Units 06/26/2019 05/29/2019 05/07/2019  WBC 4.0 - 10.5 K/uL 4.9 4.9 4.8  Hemoglobin 12.0 - 15.0 g/dL 11.5(L) 11.4(L) 11.5(L)  Hematocrit 36.0 - 46.0 % 35.2(L) 34.6(L) 34.3(L)  Platelets 150 - 400 K/uL 266 271 339    . CMP Latest Ref Rng & Units 07/05/2019 06/26/2019 05/29/2019  Glucose 70 - 99 mg/dL 120(H) 130(H) 110(H)  BUN 6 - 20 mg/dL 14 10 16   Creatinine 0.44 - 1.00 mg/dL 1.34(H) 1.20(H) 1.24(H)  Sodium 135 - 145 mmol/L 139 139 141  Potassium 3.5 - 5.1 mmol/L 3.9 4.3 4.0  Chloride 98 - 111 mmol/L 102 102 103  CO2 22 - 32 mmol/L 28 27 26   Calcium 8.9 - 10.3 mg/dL 9.0 9.1 9.2  Total Protein 6.5 - 8.1 g/dL 7.5 7.7 7.4  Total Bilirubin 0.3 - 1.2 mg/dL 0.7 0.5 0.4  Alkaline Phos 38 - 126 U/L 149(H) 162(H) 148(H)  AST 15 - 41 U/L 54(H) 97(H) 71(H)  ALT 0 - 44 U/L 93(H) 151(H) 83(H)   Component     Latest Ref Rng & Units 03/08/2019 03/22/2019  FSH     mIU/mL 65.5   LH     mIU/mL 46.6   Estrogen     pg/mL 82   Progesterone     ng/mL <0.1   Estradiol     pg/mL  <5.0      RADIOGRAPHIC STUDIES: I have personally reviewed the radiological images as listed and agreed with the findings in the report. US Abdomen Limited  Result Date: 07/03/2019 CLINICAL DATA:  Abnormal liver function tests. History of breast carcinoma. EXAM: ULTRASOUND ABDOMEN LIMITED RIGHT UPPER QUADRANT COMPARISON:  None. FINDINGS: Gallbladder: No gallstones or wall thickening  visualized. No sonographic Murphy sign noted by sonographer. Common bile duct: Diameter: 4 mm Liver: Increased liver parenchymal echogenicity with decreased through transmission of the sound beam. No liver mass or focal lesion. Normal directional flow in the portal and hepatic veins. Other: None. IMPRESSION: 1. No acute findings.  Normal gallbladder.  No bile duct dilation. 2. Significant increased liver parenchymal echogenicity consistent with extensive hepatic steatosis. Electronically Signed   By: Lajean Manes M.D.   On: 07/03/2019 14:51    ASSESSMENT & PLAN:   Shanel Prazak is a 54 y.o. female with:  1. Metastatic breast cancer ER+/PRneg/Her2 neg  02/28/2019 neck CT with results revealing "negative for mass or adenopathy in the neck."  03/04/2019 head MRI with results revealing "Negative for metastatic disease.  No acute abnormality in the brain."  2. Likely Pulmonary metastases  02/18/2019 chest and abdomen with results revealing "5cm spiculated soft tissue mass in inferior right breast, highly suspicious for primary breast carcinoma. No acute findings or metastatic disease within the abdomen or pelvis. Multiple small pulmonary nodules in both lung bases, consistent with pulmonary metastases. 4.5 cm uterine fibroid."  02/28/2019 C/A/P CT with results revealing "Irregular solid 5.0 cm right breast mass, suspicious for primary right breast malignancy. Innumerable solid pulmonary nodules scattered throughout both lungs, compatible with pulmonary metastases. No evidence of  metastatic disease in the abdomen, pelvis or skeleton. Mildly enlarged and probably myomatous uterus. Simple 1.4 cm left adnexal cyst requires no follow-up. This recommendation follows ACR consensus guidelines: White Paper of the ACR Incidental Findings Committee II on Adnexal Findings. J Am Coll Radiol [id number]. Aortic Atherosclerosis (ICD10-I70.0)."  NUCLEAR MEDICINE WHOLE BODY BONE SCAN completed on 03/19/2019  with results revealing "1. No scintigraphic evidence skeletal metastasis. 2. Degenerative bone disease in the posterior elements of the upper and mid lumbar spine."   04/09/2019 Bone Density ([id number]) which revealed "The BMD measured at Femur Neck Right is 1.153 g/cm2 with a T-score of 0.8. This patient is considered normal according to [facility]) criteria. Lumbar spine was not utilized due to advanced degenerative changes. The scan quality is good. Femur Neck Right 04/09/2019 54.7 Normal 0.8 1.153 g/cm2. Left Forearm Radius 33% 04/09/2019 54.7 Normal 0.8 0.949 g/cm2."  3. Abnormal Liver function test -- previous? Fatty liver  PLAN: -Discussed pt labwork, 07/05/19; liver enzymes have improved significantly -Discussed 07/03/2019 [place] Abd ([id number]) "1. No acute findings. Normal gallbladder. No bile duct dilation. 2. Significant increased liver parenchymal echogenicity consistent with extensive hepatic steatosis." -Pt appears to be having baseline inflammation from fatty liver - not necessarily due to medication -Will continue watching liver function with labs -Recommend pt continue to wear compression socks and elevate legs  -Advised pt to continue Verzino as prescribed and letrozole -Continue Imodium prn -Continue Calcium and Vitamin D replacement as needed  -Will see back in 1 month with labs   FOLLOW UP: RTC with Dr Irene Limbo with labs in 1 month   The total time spent in the appt was 15 minutes and more than 50% was on counseling and direct patient cares.  All of the patient's questions were answered with apparent satisfaction. The patient knows to call the clinic with any problems, questions or concerns.    Sullivan Lone MD [place] AAHIVMS [facility] Hematology/Oncology Physician [facility]  (Office):       [phone] (Work cell):  [phone] (Fax):           [phone]  07/10/2019 3:37 PM  I, Yevette Edwards, am acting as a scribe for Dr. Sullivan Lone.   .I have reviewed the above documentation for accuracy and completeness, and I agree with the above. Brunetta Genera MD

## 2019-07-15 ENCOUNTER — Telehealth: Payer: Self-pay | Admitting: Hematology

## 2019-07-15 NOTE — Telephone Encounter (Signed)
Scheduled per 02/03 los, patient has been called and voicemail was left. °

## 2019-07-27 ENCOUNTER — Ambulatory Visit: Payer: BC Managed Care – PPO

## 2019-07-29 ENCOUNTER — Other Ambulatory Visit: Payer: Self-pay

## 2019-07-29 ENCOUNTER — Inpatient Hospital Stay: Payer: BC Managed Care – PPO

## 2019-07-29 ENCOUNTER — Other Ambulatory Visit: Payer: Self-pay | Admitting: Hematology

## 2019-07-29 DIAGNOSIS — Z23 Encounter for immunization: Secondary | ICD-10-CM

## 2019-07-29 DIAGNOSIS — C7802 Secondary malignant neoplasm of left lung: Secondary | ICD-10-CM | POA: Diagnosis not present

## 2019-07-29 MED ORDER — PNEUMOCOCCAL VAC POLYVALENT 25 MCG/0.5ML IJ INJ
0.5000 mL | INJECTION | Freq: Once | INTRAMUSCULAR | Status: AC
  Administered 2019-07-29: 0.5 mL via INTRAMUSCULAR
  Filled 2019-07-29: qty 0.5

## 2019-07-29 NOTE — Patient Instructions (Signed)
Pneumococcal Vaccine, Polyvalent solution for injection What is this medicine? PNEUMOCOCCAL VACCINE, POLYVALENT (NEU mo KOK al vak SEEN, pol ee VEY luhnt) is a vaccine to prevent pneumococcus bacteria infection. These bacteria are a major cause of ear infections, Strep throat infections, and serious pneumonia, meningitis, or blood infections worldwide. These vaccines help the body to produce antibodies (protective substances) that help your body defend against these bacteria. This vaccine is recommended for people 2 years of age and older with health problems. It is also recommended for all adults over 50 years old. This vaccine will not treat an infection. This medicine may be used for other purposes; ask your health care provider or pharmacist if you have questions. COMMON BRAND NAME(S): Pneumovax 23 What should I tell my health care provider before I take this medicine? They need to know if you have any of these conditions:  bleeding problems  bone marrow or organ transplant  cancer, Hodgkin's disease  fever  infection  immune system problems  low platelet count in the blood  seizures  an unusual or allergic reaction to pneumococcal vaccine, diphtheria toxoid, other vaccines, latex, other medicines, foods, dyes, or preservatives  pregnant or trying to get pregnant  breast-feeding How should I use this medicine? This vaccine is for injection into a muscle or under the skin. It is given by a health care professional. A copy of Vaccine Information Statements will be given before each vaccination. Read this sheet carefully each time. The sheet may change frequently. Talk to your pediatrician regarding the use of this medicine in children. While this drug may be prescribed for children as young as 2 years of age for selected conditions, precautions do apply. Overdosage: If you think you have taken too much of this medicine contact a poison control center or emergency room at  once. NOTE: This medicine is only for you. Do not share this medicine with others. What if I miss a dose? It is important not to miss your dose. Call your doctor or health care professional if you are unable to keep an appointment. What may interact with this medicine?  medicines for cancer chemotherapy  medicines that suppress your immune function  medicines that treat or prevent blood clots like warfarin, enoxaparin, and dalteparin  steroid medicines like prednisone or cortisone This list may not describe all possible interactions. Give your health care provider a list of all the medicines, herbs, non-prescription drugs, or dietary supplements you use. Also tell them if you smoke, drink alcohol, or use illegal drugs. Some items may interact with your medicine. What should I watch for while using this medicine? Mild fever and pain should go away in 3 days or less. Report any unusual symptoms to your doctor or health care professional. What side effects may I notice from receiving this medicine? Side effects that you should report to your doctor or health care professional as soon as possible:  allergic reactions like skin rash, itching or hives, swelling of the face, lips, or tongue  breathing problems  confused  fever over 102 degrees F  pain, tingling, numbness in the hands or feet  seizures  unusual bleeding or bruising  unusual muscle weakness Side effects that usually do not require medical attention (report to your doctor or health care professional if they continue or are bothersome):  aches and pains  diarrhea  fever of 102 degrees F or less  headache  irritable  loss of appetite  pain, tender at site where injected    trouble sleeping This list may not describe all possible side effects. Call your doctor for medical advice about side effects. You may report side effects to FDA at 1-800-FDA-1088. Where should I keep my medicine? This does not apply. This  vaccine is given in a clinic, pharmacy, doctor's office, or other health care setting and will not be stored at home. NOTE: This sheet is a summary. It may not cover all possible information. If you have questions about this medicine, talk to your doctor, pharmacist, or health care provider.  2020 Elsevier/Gold Standard (2007-12-28 14:32:37)  

## 2019-08-03 ENCOUNTER — Ambulatory Visit: Payer: BC Managed Care – PPO | Attending: Internal Medicine

## 2019-08-03 DIAGNOSIS — Z23 Encounter for immunization: Secondary | ICD-10-CM | POA: Insufficient documentation

## 2019-08-03 NOTE — Progress Notes (Signed)
   Covid-19 Vaccination Clinic  Name:  Brandy [name]    MRN: [id number] DOB: 04/19/1966  08/03/2019  Brandy [name] was observed post Covid-19 immunization for 15 minutes without incidence. She was provided with Vaccine Information Sheet and instruction to access the V-Safe system.   Brandy [name] was instructed to call 911 with any severe reactions post vaccine: Marland Kitchen Difficulty breathing  . Swelling of your face and throat  . A fast heartbeat  . A bad rash all over your body  . Dizziness and weakness    Immunizations Administered    Name Date Dose VIS Date Route   Moderna COVID-19 Vaccine 08/03/2019 10:04 AM 0.5 mL 05/07/2019 Intramuscular   Manufacturer: Moderna   Lot: [id number]   [id number]

## 2019-08-04 ENCOUNTER — Other Ambulatory Visit: Payer: Self-pay

## 2019-08-04 ENCOUNTER — Encounter (HOSPITAL_COMMUNITY): Payer: Self-pay

## 2019-08-04 ENCOUNTER — Encounter: Payer: Self-pay | Admitting: Family Medicine

## 2019-08-04 ENCOUNTER — Observation Stay (HOSPITAL_COMMUNITY)
Admission: EM | Admit: 2019-08-04 | Discharge: 2019-08-05 | Disposition: A | Discharge status: Home / Self Care | Payer: BC Managed Care – PPO | Attending: Internal Medicine | Admitting: Internal Medicine

## 2019-08-04 ENCOUNTER — Emergency Department (HOSPITAL_COMMUNITY): Payer: BC Managed Care – PPO

## 2019-08-04 DIAGNOSIS — R000 Tachycardia, unspecified: Secondary | ICD-10-CM | POA: Insufficient documentation

## 2019-08-04 DIAGNOSIS — K219 Gastro-esophageal reflux disease without esophagitis: Secondary | ICD-10-CM | POA: Insufficient documentation

## 2019-08-04 DIAGNOSIS — Z7951 Long term (current) use of inhaled steroids: Secondary | ICD-10-CM | POA: Insufficient documentation

## 2019-08-04 DIAGNOSIS — F419 Anxiety disorder, unspecified: Secondary | ICD-10-CM | POA: Diagnosis not present

## 2019-08-04 DIAGNOSIS — J189 Pneumonia, unspecified organism: Principal | ICD-10-CM | POA: Insufficient documentation

## 2019-08-04 DIAGNOSIS — Z8774 Personal history of (corrected) congenital malformations of heart and circulatory system: Secondary | ICD-10-CM | POA: Diagnosis not present

## 2019-08-04 DIAGNOSIS — H5440 Blindness, one eye, unspecified eye: Secondary | ICD-10-CM | POA: Insufficient documentation

## 2019-08-04 DIAGNOSIS — Z23 Encounter for immunization: Secondary | ICD-10-CM | POA: Diagnosis not present

## 2019-08-04 DIAGNOSIS — Z79811 Long term (current) use of aromatase inhibitors: Secondary | ICD-10-CM | POA: Diagnosis not present

## 2019-08-04 DIAGNOSIS — Z20822 Contact with and (suspected) exposure to covid-19: Secondary | ICD-10-CM | POA: Diagnosis not present

## 2019-08-04 DIAGNOSIS — Z79899 Other long term (current) drug therapy: Secondary | ICD-10-CM | POA: Insufficient documentation

## 2019-08-04 DIAGNOSIS — N182 Chronic kidney disease, stage 2 (mild): Secondary | ICD-10-CM | POA: Diagnosis not present

## 2019-08-04 DIAGNOSIS — C50919 Malignant neoplasm of unspecified site of unspecified female breast: Secondary | ICD-10-CM | POA: Insufficient documentation

## 2019-08-04 DIAGNOSIS — R7401 Elevation of levels of liver transaminase levels: Secondary | ICD-10-CM | POA: Insufficient documentation

## 2019-08-04 DIAGNOSIS — J984 Other disorders of lung: Secondary | ICD-10-CM | POA: Diagnosis present

## 2019-08-04 LAB — CBC WITH DIFFERENTIAL/PLATELET
Abs Immature Granulocytes: 0.01 10*3/uL (ref 0.00–0.07)
Basophils Absolute: 0 10*3/uL (ref 0.0–0.1)
Basophils Relative: 1 %
Eosinophils Absolute: 0 10*3/uL (ref 0.0–0.5)
Eosinophils Relative: 1 %
HCT: 36.7 % (ref 36.0–46.0)
Hemoglobin: 11.7 g/dL — ABNORMAL LOW (ref 12.0–15.0)
Immature Granulocytes: 0 %
Lymphocytes Relative: 35 %
Lymphs Abs: 1.7 10*3/uL (ref 0.7–4.0)
MCH: 33.4 pg (ref 26.0–34.0)
MCHC: 31.9 g/dL (ref 30.0–36.0)
MCV: 104.9 fL — ABNORMAL HIGH (ref 80.0–100.0)
Monocytes Absolute: 0.3 10*3/uL (ref 0.1–1.0)
Monocytes Relative: 6 %
Neutro Abs: 2.8 10*3/uL (ref 1.7–7.7)
Neutrophils Relative %: 57 %
Platelets: 273 10*3/uL (ref 150–400)
RBC: 3.5 MIL/uL — ABNORMAL LOW (ref 3.87–5.11)
RDW: 15.6 % — ABNORMAL HIGH (ref 11.5–15.5)
WBC: 4.9 10*3/uL (ref 4.0–10.5)
nRBC: 0 % (ref 0.0–0.2)

## 2019-08-04 LAB — COMPREHENSIVE METABOLIC PANEL
ALT: 123 U/L — ABNORMAL HIGH (ref 0–44)
AST: 72 U/L — ABNORMAL HIGH (ref 15–41)
Albumin: 3.9 g/dL (ref 3.5–5.0)
Alkaline Phosphatase: 171 U/L — ABNORMAL HIGH (ref 38–126)
Anion gap: 12 (ref 5–15)
BUN: 10 mg/dL (ref 6–20)
CO2: 29 mmol/L (ref 22–32)
Calcium: 9 mg/dL (ref 8.9–10.3)
Chloride: 94 mmol/L — ABNORMAL LOW (ref 98–111)
Creatinine, Ser: 1.28 mg/dL — ABNORMAL HIGH (ref 0.44–1.00)
GFR calc Af Amer: 54 mL/min — ABNORMAL LOW (ref >60)
GFR calc non Af Amer: 47 mL/min — ABNORMAL LOW (ref >60)
Glucose, Bld: 99 mg/dL (ref 70–99)
Potassium: 4 mmol/L (ref 3.5–5.1)
Sodium: 135 mmol/L (ref 135–145)
Total Bilirubin: 0.9 mg/dL (ref 0.3–1.2)
Total Protein: 8.3 g/dL — ABNORMAL HIGH (ref 6.5–8.1)

## 2019-08-04 LAB — FIBRINOGEN: Fibrinogen: 449 mg/dL (ref 210–475)

## 2019-08-04 LAB — TROPONIN I (HIGH SENSITIVITY)
Troponin I (High Sensitivity): 4 ng/L (ref <18)
Troponin I (High Sensitivity): 5 ng/L (ref <18)

## 2019-08-04 LAB — PROCALCITONIN: Procalcitonin: 0.12 ng/mL

## 2019-08-04 LAB — I-STAT BETA HCG BLOOD, ED (MC, WL, AP ONLY): I-stat hCG, quantitative: <5 m[IU]/mL (ref <5)

## 2019-08-04 LAB — RESPIRATORY PANEL BY RT PCR (FLU A&B, COVID)
Influenza A by PCR: NEGATIVE
Influenza B by PCR: NEGATIVE
SARS Coronavirus 2 by RT PCR: NEGATIVE

## 2019-08-04 LAB — LACTIC ACID, PLASMA: Lactic Acid, Venous: 1.3 mmol/L (ref 0.5–1.9)

## 2019-08-04 LAB — C-REACTIVE PROTEIN: CRP: 7.7 mg/dL — ABNORMAL HIGH (ref <1.0)

## 2019-08-04 LAB — POC SARS CORONAVIRUS 2 AG -  ED: SARS Coronavirus 2 Ag: NEGATIVE

## 2019-08-04 LAB — LACTATE DEHYDROGENASE: LDH: 222 U/L — ABNORMAL HIGH (ref 98–192)

## 2019-08-04 LAB — TRIGLYCERIDES: Triglycerides: 301 mg/dL — ABNORMAL HIGH (ref <150)

## 2019-08-04 LAB — D-DIMER, QUANTITATIVE: D-Dimer, Quant: 1.11 ug/mL-FEU — ABNORMAL HIGH (ref 0.00–0.50)

## 2019-08-04 LAB — FERRITIN: Ferritin: 163 ng/mL (ref 11–307)

## 2019-08-04 MED ORDER — ABEMACICLIB 100 MG PO TABS: 100.0000 mg | ORAL_TABLET | Freq: Two times a day (BID) | ORAL | Status: DC

## 2019-08-04 MED ORDER — CIPROFLOXACIN IN D5W 400 MG/200ML IV SOLN
400.0000 mg | Freq: Two times a day (BID) | INTRAVENOUS | Status: DC
  Administered 2019-08-04 – 2019-08-05 (×2): 400 mg via INTRAVENOUS
  Filled 2019-08-04 (×2): qty 200

## 2019-08-04 MED ORDER — BUDESONIDE 0.25 MG/2ML IN SUSP
0.2500 mg | Freq: Two times a day (BID) | RESPIRATORY_TRACT | Status: DC
  Administered 2019-08-04 – 2019-08-05 (×2): 0.25 mg via RESPIRATORY_TRACT
  Filled 2019-08-04 (×2): qty 2

## 2019-08-04 MED ORDER — ALPRAZOLAM 1 MG PO TABS: 1.0000 mg | ORAL_TABLET | Freq: Every day | ORAL | Status: DC | PRN

## 2019-08-04 MED ORDER — BENZONATATE 100 MG PO CAPS
100.0000 mg | ORAL_CAPSULE | Freq: Once | ORAL | Status: AC
  Administered 2019-08-04: 20:00:00 100 mg via ORAL
  Filled 2019-08-04: qty 1

## 2019-08-04 MED ORDER — ACETAMINOPHEN 325 MG PO TABS
650.0000 mg | ORAL_TABLET | Freq: Four times a day (QID) | ORAL | Status: DC | PRN
  Administered 2019-08-04 – 2019-08-05 (×2): 650 mg via ORAL
  Filled 2019-08-04 (×2): qty 2

## 2019-08-04 MED ORDER — ENOXAPARIN SODIUM 40 MG/0.4ML ~~LOC~~ SOLN: 40.0000 mg | SUBCUTANEOUS | Status: DC

## 2019-08-04 MED ORDER — POLYETHYLENE GLYCOL 3350 17 G PO PACK: 17.0000 g | PACK | Freq: Every day | ORAL | Status: DC | PRN

## 2019-08-04 MED ORDER — LETROZOLE 2.5 MG PO TABS
2.5000 mg | ORAL_TABLET | Freq: Every day | ORAL | Status: DC
  Administered 2019-08-05: 2.5 mg via ORAL
  Filled 2019-08-04: qty 1

## 2019-08-04 MED ORDER — ACETAMINOPHEN 500 MG PO TABS
1000.0000 mg | ORAL_TABLET | Freq: Once | ORAL | Status: AC
  Administered 2019-08-04: 1000 mg via ORAL
  Filled 2019-08-04: qty 2

## 2019-08-04 MED ORDER — SODIUM CHLORIDE (PF) 0.9 % IJ SOLN
INTRAMUSCULAR | Status: AC
  Filled 2019-08-04: qty 50

## 2019-08-04 MED ORDER — PANTOPRAZOLE SODIUM 40 MG PO TBEC
40.0000 mg | DELAYED_RELEASE_TABLET | Freq: Every day | ORAL | Status: DC
  Administered 2019-08-04 – 2019-08-05 (×2): 40 mg via ORAL
  Filled 2019-08-04 (×2): qty 1

## 2019-08-04 MED ORDER — ONDANSETRON HCL 4 MG/2ML IJ SOLN: 4.0000 mg | Freq: Four times a day (QID) | INTRAMUSCULAR | Status: DC | PRN

## 2019-08-04 MED ORDER — IPRATROPIUM-ALBUTEROL 0.5-2.5 (3) MG/3ML IN SOLN
3.0000 mL | RESPIRATORY_TRACT | Status: DC | PRN
  Administered 2019-08-04 – 2019-08-05 (×2): 3 mL via RESPIRATORY_TRACT
  Filled 2019-08-04 (×2): qty 3

## 2019-08-04 MED ORDER — SODIUM CHLORIDE 0.9 % IV SOLN
1.0000 g | Freq: Once | INTRAVENOUS | Status: AC
  Administered 2019-08-04: 18:00:00 1 g via INTRAVENOUS
  Filled 2019-08-04: qty 10

## 2019-08-04 MED ORDER — SODIUM CHLORIDE 0.9 % IV BOLUS
1000.0000 mL | Freq: Once | INTRAVENOUS | Status: AC
  Administered 2019-08-04: 1000 mL via INTRAVENOUS

## 2019-08-04 MED ORDER — FLUTICASONE PROPIONATE HFA 110 MCG/ACT IN AERO: 2.0000 | INHALATION_SPRAY | Freq: Two times a day (BID) | RESPIRATORY_TRACT | Status: DC

## 2019-08-04 MED ORDER — POTASSIUM CHLORIDE IN NACL 20-0.9 MEQ/L-% IV SOLN
INTRAVENOUS | Status: DC
  Filled 2019-08-04 (×2): qty 1000

## 2019-08-04 MED ORDER — IOHEXOL 350 MG/ML SOLN
80.0000 mL | Freq: Once | INTRAVENOUS | Status: AC | PRN
  Administered 2019-08-04: 17:00:00 80 mL via INTRAVENOUS

## 2019-08-04 MED ORDER — ONDANSETRON HCL 4 MG PO TABS: 4.0000 mg | ORAL_TABLET | Freq: Four times a day (QID) | ORAL | Status: DC | PRN

## 2019-08-04 MED ORDER — SODIUM CHLORIDE 0.9 % IV BOLUS
1000.0000 mL | Freq: Once | INTRAVENOUS | Status: AC
  Administered 2019-08-04: 18:00:00 1000 mL via INTRAVENOUS

## 2019-08-04 MED ORDER — ACETAMINOPHEN 650 MG RE SUPP: 650.0000 mg | Freq: Four times a day (QID) | RECTAL | Status: DC | PRN

## 2019-08-04 MED ORDER — DOXYCYCLINE HYCLATE 100 MG PO TABS
100.0000 mg | ORAL_TABLET | Freq: Once | ORAL | Status: AC
  Administered 2019-08-04: 18:00:00 100 mg via ORAL
  Filled 2019-08-04: qty 1

## 2019-08-04 MED ORDER — BENZONATATE 100 MG PO CAPS
100.0000 mg | ORAL_CAPSULE | Freq: Three times a day (TID) | ORAL | Status: DC
  Administered 2019-08-05 (×2): 100 mg via ORAL
  Filled 2019-08-04 (×2): qty 1

## 2019-08-04 NOTE — ED Provider Notes (Signed)
[facility] DEPT Provider Note   CSN: [id number] Arrival date & time: 08/04/19  1227     History Chief Complaint  Patient presents with  . Fever  . Cough  . Generalized Body Aches  . Headache    Brandy [name] is a 54 y.o. female.  Patient with history of breast cancer on oral chemotherapy sent from urgent care with a 5-day history of body aches, fever, cough, headache, shortness of breath.  Symptoms started about 5 days ago with body aches, headache, chills, subjective fever and shortness of breath with nonproductive cough.  Patient states she occasionally has coughed up some blood streaks.  Denies chest pain.  He had a fever at home as well as chills and body aches and headache.  She did receive her second Covid shot yesterday was feeling ill prior to this.  Her son has been sick as well.  Denies any recent out of the country travel.  She was referred from urgent care given her history immunosuppression and ongoing fever and tachycardia.  Her last dose of antipyretics was yesterday. No nausea, vomiting, diarrhea.  The history is provided by the patient.  Fever Associated symptoms: congestion, cough, headaches, myalgias, rhinorrhea and sore throat   Associated symptoms: no chest pain, no dysuria, no nausea, no rash and no vomiting   Cough Associated symptoms: fever, headaches, myalgias, rhinorrhea, shortness of breath and sore throat   Associated symptoms: no chest pain and no rash   Headache Associated symptoms: congestion, cough, fatigue, fever, myalgias, sore throat and weakness   Associated symptoms: no abdominal pain, no dizziness, no nausea and no vomiting        Past Medical History:  Diagnosis Date  . Arthritis   . ASD (atrial septal defect)    s/p closure with Amplatzer device 10/05/04 (Dr. Myriam Jacobson, [facility]) 10/05/04  . Cataract   . GERD (gastroesophageal reflux disease)   . Headache   . Heart murmur    no longer heard  .  Legally blind in right eye, as defined in [place]   . Lumbar herniated disc   . Sciatica     Patient Active Problem List   Diagnosis Date Noted  . Acute pansinusitis 08/02/2017  . Insomnia 08/02/2017  . Low back pain 08/02/2017  . Migraine 08/02/2017  . Neuropathy 08/02/2017  . 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 Left shoulder tendonitis 05/23/2016    Class: Acute  . Post-op pain 05/23/2016  . Anxiety disorder 01/07/2015    Past Surgical History:  Procedure Laterality Date  . ABLATION    . BREAST SURGERY Bilateral 2011   Breast Reduction Surgery  . BUNIONECTOMY    . CARDIAC CATHETERIZATION     10/05/04 [facility]): LM < 25%, otherwise normal coronaries. No pulmonary HTN, Mildly enlarged RV. Secundum ASD s/p closure.  Marland Kitchen CARDIAC SURGERY    . CATARACT EXTRACTION    . CLEFT PALATE REPAIR     s/p cleft lip and palate repair  . LUMBAR LAMINECTOMY/DECOMPRESSION MICRODISCECTOMY Left 05/23/2016   Procedure: LEFT L4-L5 LATERAL RECESS DECOMPRESSION WITH CENTRAL AND RIGHT DECOMPRESSION VIA LEFT SIDE;  Surgeon: [name], MD;  Location: [facility];  Service: Orthopedics;  Laterality: Left;  . LUMBAR LAMINECTOMY/DECOMPRESSION MICRODISCECTOMY Left 05/23/2016   Procedure: LUMBAR LAMINECTOMY/DECOMPRESSION MICRODISCECTOMY Lumbar five - Sacral One 1 LEVEL;  Surgeon: [name], MD;  Location: [place];  Service: Orthopedics;  Laterality: Left;  . SHOULDER  INJECTION Left 05/23/2016   Procedure: SHOULDER INJECTION;  Surgeon: [name], MD;  Location: [place];  Service: Orthopedics;  Laterality: Left;  band-aid per pa-c  . TRANSTHORACIC ECHOCARDIOGRAM     12/15/05 [facility]): Mild LVH, EF > [id number], grade 1 diastolic dysfunction, Trivial MR/PR/TR.     OB History   No obstetric history on file.     Family History  Problem Relation Age of Onset  . Diabetes Mother   . High blood pressure Mother   . Cancer Father        Lung  . Colon cancer Neg Hx   .  Colon polyps Neg Hx   . Esophageal cancer Neg Hx   . Rectal cancer Neg Hx   . Stomach cancer Neg Hx     Social History   Tobacco Use  . Smoking status: Current Every Day Smoker    Packs/day: 0.50    Types: Cigarettes  . Smokeless tobacco: Never Used  Substance Use Topics  . Alcohol use: No  . Drug use: No    Home Medications Prior to Admission medications   Medication Sig Start Date End Date Taking? Authorizing Provider  albuterol (VENTOLIN HFA) 108 (90 Base) MCG/ACT inhaler Inhale 2 puffs into the lungs every 6 (six) hours as needed for wheezing or shortness of breath. 06/06/19   Tanner, Lyndon Code., PA-C  ALPRAZolam [name]) 1 MG tablet Take 1 mg by mouth daily. 02/14/19   [provider]  amitriptyline (ELAVIL) 75 MG tablet Take 75 mg by mouth at bedtime.  01/27/15   [provider]  benzonatate (TESSALON) 100 MG capsule Take 1 capsule (100 mg total) by mouth 3 (three) times daily as needed for cough. 04/22/19   Brunetta Genera, MD  chlorpheniramine-HYDROcodone (TUSSIONEX) 10-8 MG/5ML SUER Take 5 mLs by mouth at bedtime as needed for cough. 06/06/19   Tanner, Lyndon Code., PA-C  cyclobenzaprine (FLEXERIL) 10 MG tablet Take 1 tablet (10 mg total) by mouth 2 (two) times daily as needed for muscle spasms. 02/18/19   Robinson, [place] N, PA-C  ergocalciferol (VITAMIN D2) 1.25 MG (50000 UT) capsule Take 1 capsule (50,000 Units total) by mouth once a week. 03/08/19   Brunetta Genera, MD  fluticasone (FLOVENT HFA) 110 MCG/ACT inhaler Inhale 2 puffs into the lungs 2 (two) times daily. 06/06/19   Tanner, Lyndon Code., PA-C  HYDROcodone-Acetaminophen 7.5-300 MG TABS Take 1 tablet by mouth 3 (three) times daily as needed. 05/29/19   Brunetta Genera, MD  letrozole [facility]) 2.5 MG tablet Take 1 tablet (2.5 mg total) by mouth daily. 03/22/19   Brunetta Genera, MD  omeprazole (PRILOSEC) 10 MG capsule Take by mouth.    [provider]  ondansetron (ZOFRAN) 8 MG tablet Take  1 tablet (8 mg total) by mouth every 8 (eight) hours as needed for nausea or vomiting. 03/26/19   Brunetta Genera, MD  sulfamethoxazole-trimethoprim (BACTRIM DS) 800-160 MG tablet Take 1 tablet by mouth 2 (two) times daily. 06/06/19   Tanner, Lucianne Lei E., PA-C  VERZENIO 100 MG tablet TAKE 1 TABLET (100 MG TOTAL) BY MOUTH 2 (TWO) TIMES DAILY. SWALLOW TABLETS WHOLE. DO NOT CHEW, CRUSH, OR SPLIT TABLETS BEFORE SWALLOWING. 06/25/19   Brunetta Genera, MD    Allergies    Zithromax [azithromycin] and Psyllium  Review of Systems   Review of Systems  Constitutional: Positive for activity change, appetite change, fatigue and fever.  HENT: Positive for congestion, rhinorrhea and sore throat.  Eyes: Negative for visual disturbance.  Respiratory: Positive for cough and shortness of breath.   Cardiovascular: Negative for chest pain.  Gastrointestinal: Negative for abdominal pain, nausea and vomiting.  Genitourinary: Negative for dysuria and hematuria.  Musculoskeletal: Positive for arthralgias and myalgias.  Skin: Negative for rash.  Neurological: Positive for weakness and headaches. Negative for dizziness and light-headedness.    all other systems are negative except as noted in the HPI and PMH.   Physical Exam Updated Vital Signs BP (!) 166/80 (BP Location: Right Arm)   Pulse (!) 125   Temp (!) 102.3 F (39.1 C) (Oral)   Resp 20   Ht 5\' 7"  (1.702 m)   Wt 103 kg   SpO2 95%   BMI 35.55 kg/m   Physical Exam Vitals and nursing note reviewed.  Constitutional:      General: She is not in acute distress.    Appearance: She is well-developed.     Comments: Ill-appearing but nontoxic, speaking in full sentences  HENT:     Head: Normocephalic and atraumatic.     Mouth/Throat:     Pharynx: No oropharyngeal exudate.  Eyes:     Conjunctiva/sclera: Conjunctivae normal.     Pupils: Pupils are equal, round, and reactive to light.     Comments: Right eye surgically absent  Neck:      Comments: No meningismus. Cardiovascular:     Rate and Rhythm: Regular rhythm. Tachycardia present.     Heart sounds: Normal heart sounds. No murmur.  Pulmonary:     Effort: Pulmonary effort is normal. No respiratory distress.     Breath sounds: Normal breath sounds. No wheezing.  Abdominal:     Palpations: Abdomen is soft.     Tenderness: There is no abdominal tenderness. There is no guarding or rebound.  Musculoskeletal:        General: No tenderness. Normal range of motion.     Cervical back: Normal range of motion and neck supple.  Skin:    General: Skin is warm.     Capillary Refill: Capillary refill takes less than 2 seconds.  Neurological:     General: No focal deficit present.     Mental Status: She is alert and oriented to person, place, and time. Mental status is at baseline.     Cranial Nerves: No cranial nerve deficit.     Motor: No abnormal muscle tone.     Coordination: Coordination normal.     Comments:  5/5 strength throughout. CN 2-12 intact.Equal grip strength.   Psychiatric:        Behavior: Behavior normal.     ED Results / Procedures / Treatments   Labs (all labs ordered are listed, but only abnormal results are displayed) Labs Reviewed  CBC WITH DIFFERENTIAL/PLATELET - Abnormal; Notable for the following components:      Result Value   RBC 3.50 (*)    Hemoglobin 11.7 (*)    MCV 104.9 (*)    RDW 15.6 (*)    All other components within normal limits  COMPREHENSIVE METABOLIC PANEL - Abnormal; Notable for the following components:   Chloride 94 (*)    Creatinine, Ser 1.28 (*)    Total Protein 8.3 (*)    AST 72 (*)    ALT 123 (*)    Alkaline Phosphatase 171 (*)    GFR calc non Af Amer 47 (*)    GFR calc Af Amer 54 (*)    All other components within normal limits  D-DIMER, QUANTITATIVE (NOT AT [facility]) - Abnormal; Notable for the following components:   D-Dimer, Quant 1.11 (*)    All other components within normal limits  LACTATE DEHYDROGENASE -  Abnormal; Notable for the following components:   LDH 222 (*)    All other components within normal limits  TRIGLYCERIDES - Abnormal; Notable for the following components:   Triglycerides 301 (*)    All other components within normal limits  C-REACTIVE PROTEIN - Abnormal; Notable for the following components:   CRP 7.7 (*)    All other components within normal limits  CULTURE, BLOOD (ROUTINE X 2)  CULTURE, BLOOD (ROUTINE X 2)  RESPIRATORY PANEL BY RT PCR (FLU A&B, COVID)  LACTIC ACID, PLASMA  PROCALCITONIN  FERRITIN  FIBRINOGEN  LACTIC ACID, PLASMA  POC SARS CORONAVIRUS 2 AG -  ED  I-STAT BETA HCG BLOOD, ED ([facility], [facility], [facility] ONLY)  TROPONIN I (HIGH SENSITIVITY)  TROPONIN I (HIGH SENSITIVITY)    EKG EKG Interpretation  Date/Time:  Sunday August 04 2019 12:46:13 EST Ventricular Rate:  125 PR Interval:    QRS Duration: 79 QT Interval:  305 QTC Calculation: 440 R Axis:   80 Text Interpretation: Rate faster Nonspecific ST abnormality Sinus tachycardia Ventricular premature complex Confirmed by Ezequiel Essex [phone]) on 08/04/2019 1:06:31 PM   Radiology CT Angio Chest PE W and/or Wo Contrast  Result Date: 08/04/2019 CLINICAL DATA:  PE suspected EXAM: CT ANGIOGRAPHY CHEST WITH CONTRAST TECHNIQUE: Multidetector CT imaging of the chest was performed using the standard protocol during bolus administration of intravenous contrast. Multiplanar CT image reconstructions and MIPs were obtained to evaluate the vascular anatomy. CONTRAST:  22mL OMNIPAQUE IOHEXOL 350 MG/ML SOLN COMPARISON:  02/28/2019 FINDINGS: Cardiovascular: Mild atheromatous plaque in the thoracic aorta. Signs of atrial septal closure. Heart size remains stable. No pericardial effusion. Study is motion limited with respect to lower lobe branches due to respiratory motion. No lobar or central pulmonary embolus. Study is also limited secondary to bolus timing. Mediastinum/Nodes: Normal thoracic inlet. No signs of axillary  lymphadenopathy. No mediastinal adenopathy. No signs of hilar lymphadenopathy. Esophagus is grossly normal. Lungs/Pleura: Signs of basilar atelectasis. Pulmonary nodule in the right upper lobe (image 22, series 6) this measures 9 x 9 mm on today's study, previously 15 mm in greatest axial dimension. Similar decrease in size of numerous pulmonary nodules that were seen on the prior examination with a centrally near complete resolution of left upper lobe nodules since the previous exam. Subtle scattered ground-glass attenuation is nonspecific, no consolidation. No signs of pleural effusion. Airways are patent. Upper Abdomen: Low-attenuation in hepatic parenchyma likely reflects steatosis. Lobular hepatic contours. No acute upper abdominal process. Musculoskeletal: Right breast mass is less discrete with signs of spiculation extending into breast fat. Area measures 3.0 x 2.1 cm, previously 4.0 x 3.2 cm. No signs of bone lesion or acute bone finding. Review of the MIP images confirms the above findings. IMPRESSION: 1. No lobar or central pulmonary embolus detected. Exam is limited secondary to respiratory motion. 2. Mild ground-glass attenuation may represent mild pneumonitis or areas of air trapping. 3. Signs of atrial septal closure. 4. Decrease in size and number of bilateral pulmonary nodules, marked response noted on today's exam with the only nodule remaining near a cm in the right upper lobe and the smaller nodules that were present on the previous examination throughout the chest no longer measurable though the lower lobes are limited by respiratory motion. 5. Decreased size of right breast mass. 6.  Probable hepatic steatosis. Aortic Atherosclerosis (ICD10-I70.0). Electronically Signed   By: Zetta Bills M.D.   On: 08/04/2019 17:34   DG Chest Portable 1 View  Result Date: 08/04/2019 CLINICAL DATA:  Shortness of breath, body aches EXAM: PORTABLE CHEST 1 VIEW COMPARISON:  06/06/2019 FINDINGS: The heart size  and mediastinal contours are within normal limits. Both lungs are clear. The visualized skeletal structures are unremarkable. IMPRESSION: No active disease. Electronically Signed   By: Kathreen Devoid   On: 08/04/2019 13:17    Procedures Procedures (including critical care time)  Medications Ordered in ED Medications - No data to display  ED Course  I have reviewed the triage vital signs and the nursing notes.  Pertinent labs & imaging results that were available during my care of the patient were reviewed by me and considered in my medical decision making (see chart for details).    MDM Rules/Calculators/A&P                     5 days of body aches, fever, headache, cough and shortness of breath.  Concern for Covid infection versus other viral infection versus pneumonia. No hypoxia or increased work of breathing   Chest x-ray is negative with infiltrate.  She is not wheezing.  Patient given antipyretics as well as IV fluid.  Nonischemic.  Troponin negative.  Initial Covid antigen is negative.  Patient persistently tachycardic.  She is ambulatory without desaturation.  Suspect this is a viral syndrome.  Her Covid testing is negative influenza testing is negative.  Imaging does not show any evidence of pulmonary embolism or pneumonia.  She does have groundglass opacities which may be areas of pneumonitis.  Will start antibiotics.  Persistent tachycardic and tachypnea. Patient prefers admission while cultures pending which is not unreasonable given her immunocompromised state.  D/w Dr. Claria Dice.     Anecia Serven was evaluated in Emergency Department on 08/04/2019 for the symptoms described in the history of present illness. She was evaluated in the context of the global COVID-19 pandemic, which necessitated consideration that the patient might be at risk for infection with the SARS-CoV-2 virus that causes COVID-19. Institutional protocols and algorithms that pertain to the evaluation  of patients at risk for COVID-19 are in a state of rapid change based on information released by regulatory bodies including the CDC and federal and state organizations. These policies and algorithms were followed during the patient's care in the ED.  Final Clinical Impression(s) / ED Diagnoses Final diagnoses:  Community acquired pneumonia, unspecified laterality  Tachycardia    Rx / DC Orders ED Discharge Orders    None       Hadyn Azer, Annie Main, MD 08/04/19 2343

## 2019-08-04 NOTE — ED Triage Notes (Signed)
Pt c/o body aches, fever, cough, headache x5 days. Pt denies SOB, denies chest pain. Pt last took tylenol for fever yesterday. Pt states she received her second COVID vaccine shot yesterday.

## 2019-08-04 NOTE — H&P (Addendum)
PCP:   [facility]   Chief Complaint: Cough   HPI: This is a 54 year old female who was diagnosed with breast cancer in September 2021.  She is on oral chemotherapy daily.  She takes letrozole daily and Verzenio twice daily.  She has never been on active chemotherapy. Since Tuesday the patient reports she has been getting significant chills and intractable coughing spells that would not let up.  Her entire body began to hurt including her head and diaphragm.  She has SOB with the coughing spell. Her cough is nonproductive. 1 day ago she had blood tinged sputum, this has not recurred. She states she has not been having fevers before today. She reports occasional wheezing. Patient coughs after eating or drinking. She reports having heartburn for which she takes OTC nexium. It corrects her GERD symptoms. She received her second covid shot yesterday but the symptoms were going on from Tuesday. She was doing okay but decided to go to urgent care today.  At the urgent care she was febrile to 103.  She was redirected to the ER.  In the ER she was tachycardic up to 120 and tachypneic.  She does not have oxygen need.  Her CT chest pneumonitis but no active pneumonia.  It is negative for pulmonary embolism.  History provided by the patient who is alert oriented.   Review of Systems:  The patient denies anorexia, fever, weight loss, cough, shortness of breath, vision loss, decreased hearing, hoarseness, chest pain, syncope, dyspnea on exertion, peripheral edema, balance deficits, hemoptysis, abdominal pain, melena, hematochezia, severe indigestion/heartburn, hematuria, incontinence, genital sores, muscle weakness, suspicious skin lesions, transient blindness, difficulty walking, depression, unusual weight change, abnormal bleeding, enlarged lymph nodes, angioedema, and breast masses.  Past Medical History: Past Medical History:  Diagnosis Date  . Arthritis   . ASD (atrial septal defect)     s/p closure with Amplatzer device 10/05/04 (Dr. Myriam Jacobson, [facility]) 10/05/04  . Cataract   . GERD (gastroesophageal reflux disease)   . Headache   . Heart murmur    no longer heard  . Legally blind in right eye, as defined in [place]   . Lumbar herniated disc   . Sciatica    Past Surgical History:  Procedure Laterality Date  . ABLATION    . BREAST SURGERY Bilateral 2011   Breast Reduction Surgery  . BUNIONECTOMY    . CARDIAC CATHETERIZATION     10/05/04 [facility]): LM < 25%, otherwise normal coronaries. No pulmonary HTN, Mildly enlarged RV. Secundum ASD s/p closure.  Marland Kitchen CARDIAC SURGERY    . CATARACT EXTRACTION    . CLEFT PALATE REPAIR     s/p cleft lip and palate repair  . LUMBAR LAMINECTOMY/DECOMPRESSION MICRODISCECTOMY Left 05/23/2016   Procedure: LEFT L4-L5 LATERAL RECESS DECOMPRESSION WITH CENTRAL AND RIGHT DECOMPRESSION VIA LEFT SIDE;  Surgeon: [name], MD;  Location: [place];  Service: Orthopedics;  Laterality: Left;  . LUMBAR LAMINECTOMY/DECOMPRESSION MICRODISCECTOMY Left 05/23/2016   Procedure: LUMBAR LAMINECTOMY/DECOMPRESSION MICRODISCECTOMY Lumbar five - Sacral One 1 LEVEL;  Surgeon: [name], MD;  Location: [place];  Service: Orthopedics;  Laterality: Left;  . SHOULDER INJECTION Left 05/23/2016   Procedure: SHOULDER INJECTION;  Surgeon: [name], MD;  Location: [place];  Service: Orthopedics;  Laterality: Left;  band-aid per pa-c  . TRANSTHORACIC ECHOCARDIOGRAM     12/15/05 [facility]): Mild LVH, EF > [id number], grade 1 diastolic dysfunction, Trivial MR/PR/TR.    Medications: Prior to Admission medications  Medication Sig Start Date End Date Taking? Authorizing Provider  albuterol (VENTOLIN HFA) 108 (90 Base) MCG/ACT inhaler Inhale 2 puffs into the lungs every 6 (six) hours as needed for wheezing or shortness of breath. 06/06/19   Tanner, Lyndon Code., PA-C  ALPRAZolam [name]) 1 MG tablet Take 1 mg by mouth daily. 02/14/19   [provider]  amitriptyline (ELAVIL) 75 MG  tablet Take 75 mg by mouth at bedtime.  01/27/15   [provider]  amoxicillin-clavulanate (AUGMENTIN) 875-125 MG tablet Take 1 tablet by mouth every 12 (twelve) hours. Take 1 tablet by mouth every 12 hours with meals.    [provider]  benzonatate (TESSALON) 100 MG capsule Take 1 capsule (100 mg total) by mouth 3 (three) times daily as needed for cough. 04/22/19   Brunetta Genera, MD  chlorpheniramine-HYDROcodone (TUSSIONEX) 10-8 MG/5ML SUER Take 5 mLs by mouth at bedtime as needed for cough. 06/06/19   Tanner, Lyndon Code., PA-C  cyclobenzaprine (FLEXERIL) 10 MG tablet Take 1 tablet (10 mg total) by mouth 2 (two) times daily as needed for muscle spasms. 02/18/19   Robinson, [place] N, PA-C  ergocalciferol (VITAMIN D2) 1.25 MG (50000 UT) capsule Take 1 capsule (50,000 Units total) by mouth once a week. 03/08/19   Brunetta Genera, MD  fluticasone (FLOVENT HFA) 110 MCG/ACT inhaler Inhale 2 puffs into the lungs 2 (two) times daily. 06/06/19   Tanner, Lyndon Code., PA-C  HYDROcodone-Acetaminophen 7.5-300 MG TABS Take 1 tablet by mouth 3 (three) times daily as needed. 05/29/19   Brunetta Genera, MD  letrozole [facility]) 2.5 MG tablet Take 1 tablet (2.5 mg total) by mouth daily. 03/22/19   Brunetta Genera, MD  omeprazole (PRILOSEC) 10 MG capsule Take by mouth.    [provider]  ondansetron (ZOFRAN) 8 MG tablet Take 1 tablet (8 mg total) by mouth every 8 (eight) hours as needed for nausea or vomiting. 03/26/19   Brunetta Genera, MD  sulfamethoxazole-trimethoprim (BACTRIM DS) 800-160 MG tablet Take 1 tablet by mouth 2 (two) times daily. 06/06/19   Tanner, Lucianne Lei E., PA-C  VERZENIO 100 MG tablet TAKE 1 TABLET (100 MG TOTAL) BY MOUTH 2 (TWO) TIMES DAILY. SWALLOW TABLETS WHOLE. DO NOT CHEW, CRUSH, OR SPLIT TABLETS BEFORE SWALLOWING. Patient taking differently: Take 100 mg by mouth 2 (two) times daily.  06/25/19   Brunetta Genera, MD    Allergies:   Allergies  Allergen  Reactions  . Zithromax [Azithromycin] Shortness Of Breath and Itching    TOTAL BODY ITCHING [EVEN SOLES OF FEET] WHEEZING   . Psyllium Nausea And Vomiting    Social History:  reports that she quit smoking cigarettes. She had been smoking about 0.50 packs per day. She has never used smokeless tobacco. She reports that she does not drink alcohol or use drugs.  Family History: Family History  Problem Relation Age of Onset  . Diabetes Mother   . High blood pressure Mother   . Cancer Father        Lung  . Colon cancer Neg Hx   . Colon polyps Neg Hx   . Esophageal cancer Neg Hx   . Rectal cancer Neg Hx   . Stomach cancer Neg Hx     Physical Exam: Vitals:   08/04/19 1615 08/04/19 1630 08/04/19 1645 08/04/19 1700  BP:    109/88  Pulse: (!) 113 (!) 114 (!) 113 (!) 114  Resp: (!) 23 (!) 31 (!) 22 19  Temp:  TempSrc:      SpO2: 96% 95% 96% 96%  Weight:      Height:        General:  Alert and oriented times three, well developed and nourished, no acute distress Eyes: PERRLA left eye, right eye opaque, pink conjunctiva left eye, no scleral icterus ENT: Moist oral mucosa, neck supple, no thyromegaly Lungs: clear to ascultation, no wheeze, no crackles, no use of accessory muscles Cardiovascular: regular rate and rhythm, no regurgitation, no gallops, no murmurs. No carotid bruits, no JVD Abdomen: soft, positive BS, non-tender, non-distended, no organomegaly, not an acute abdomen GU: not examined Neuro: CN II - XII grossly intact, sensation intact Musculoskeletal: strength 5/5 all extremities, no clubbing, cyanosis or edema Skin: no rash, no subcutaneous crepitation, no decubitus Psych: appropriate patient   Labs on Admission:  Recent Labs    08/04/19 1407  NA 135  K 4.0  CL 94*  CO2 29  GLUCOSE 99  BUN 10  CREATININE 1.28*  CALCIUM 9.0   Recent Labs    08/04/19 1407  AST 72*  ALT 123*  ALKPHOS 171*  BILITOT 0.9  PROT 8.3*  ALBUMIN 3.9   No results for  input(s): LIPASE, AMYLASE in the last 72 hours. Recent Labs    08/04/19 1407  WBC 4.9  NEUTROABS 2.8  HGB 11.7*  HCT 36.7  MCV 104.9*  PLT 273   No results for input(s): CKTOTAL, CKMB, CKMBINDEX, TROPONINI in the last 72 hours. Invalid input(s): POCBNP Recent Labs    08/04/19 1407  DDIMER 1.11*   No results for input(s): HGBA1C in the last 72 hours. Recent Labs    08/04/19 1407  TRIG 301*   No results for input(s): TSH, T4TOTAL, T3FREE, THYROIDAB in the last 72 hours.  Invalid input(s): FREET3 Recent Labs    08/04/19 1306  FERRITIN 163    Micro Results: Recent Results (from the past 240 hour(s))  Respiratory Panel by RT PCR (Flu A&B, Covid) - Nasopharyngeal Swab     Status: None   Collection Time: 08/04/19  3:35 PM   Specimen: Nasopharyngeal Swab  Result Value Ref Range Status   SARS Coronavirus 2 by RT PCR NEGATIVE NEGATIVE Final    Comment: (NOTE) SARS-CoV-2 target nucleic acids are NOT DETECTED. The SARS-CoV-2 RNA is generally detectable in upper respiratoy specimens during the acute phase of infection. The lowest concentration of SARS-CoV-2 viral copies this assay can detect is 131 copies/mL. A negative result does not preclude SARS-Cov-2 infection and should not be used as the sole basis for treatment or other patient management decisions. A negative result may occur with  improper specimen collection/handling, submission of specimen other than nasopharyngeal swab, presence of viral mutation(s) within the areas targeted by this assay, and inadequate number of viral copies (<131 copies/mL). A negative result must be combined with clinical observations, patient history, and epidemiological information. The expected result is Negative. Fact Sheet for Patients:  PinkCheek.be Fact Sheet for Healthcare Providers:  GravelBags.it This test is not yet ap proved or cleared by the [place] FDA and  has  been authorized for detection and/or diagnosis of SARS-CoV-2 by FDA under an Emergency Use Authorization (EUA). This EUA will remain  in effect (meaning this test can be used) for the duration of the COVID-19 declaration under Section 564(b)(1) of the Act, 21 U.S.C. section 360bbb-3(b)(1), unless the authorization is terminated or revoked sooner.    Influenza A by PCR NEGATIVE NEGATIVE Final   Influenza B by PCR  NEGATIVE NEGATIVE Final    Comment: (NOTE) The Xpert Xpress SARS-CoV-2/FLU/RSV assay is intended as an aid in  the diagnosis of influenza from Nasopharyngeal swab specimens and  should not be used as a sole basis for treatment. Nasal washings and  aspirates are unacceptable for Xpert Xpress SARS-CoV-2/FLU/RSV  testing. Fact Sheet for Patients: PinkCheek.be Fact Sheet for Healthcare Providers: GravelBags.it This test is not yet approved or cleared by the [place] FDA and  has been authorized for detection and/or diagnosis of SARS-CoV-2 by  FDA under an Emergency Use Authorization (EUA). This EUA will remain  in effect (meaning this test can be used) for the duration of the  Covid-19 declaration under Section 564(b)(1) of the Act, 21  U.S.C. section 360bbb-3(b)(1), unless the authorization is  terminated or revoked. Performed at [facility], [place] [address] [place], [place] [address]      Radiological Exams on Admission: CT Angio Chest PE W and/or Wo Contrast  Result Date: 08/04/2019 CLINICAL DATA:  PE suspected EXAM: CT ANGIOGRAPHY CHEST WITH CONTRAST TECHNIQUE: Multidetector CT imaging of the chest was performed using the standard protocol during bolus administration of intravenous contrast. Multiplanar CT image reconstructions and MIPs were obtained to evaluate the vascular anatomy. CONTRAST:  51mL OMNIPAQUE IOHEXOL 350 MG/ML SOLN COMPARISON:  02/28/2019 FINDINGS: Cardiovascular: Mild  atheromatous plaque in the thoracic aorta. Signs of atrial septal closure. Heart size remains stable. No pericardial effusion. Study is motion limited with respect to lower lobe branches due to respiratory motion. No lobar or central pulmonary embolus. Study is also limited secondary to bolus timing. Mediastinum/Nodes: Normal thoracic inlet. No signs of axillary lymphadenopathy. No mediastinal adenopathy. No signs of hilar lymphadenopathy. Esophagus is grossly normal. Lungs/Pleura: Signs of basilar atelectasis. Pulmonary nodule in the right upper lobe (image 22, series 6) this measures 9 x 9 mm on today's study, previously 15 mm in greatest axial dimension. Similar decrease in size of numerous pulmonary nodules that were seen on the prior examination with a centrally near complete resolution of left upper lobe nodules since the previous exam. Subtle scattered ground-glass attenuation is nonspecific, no consolidation. No signs of pleural effusion. Airways are patent. Upper Abdomen: Low-attenuation in hepatic parenchyma likely reflects steatosis. Lobular hepatic contours. No acute upper abdominal process. Musculoskeletal: Right breast mass is less discrete with signs of spiculation extending into breast fat. Area measures 3.0 x 2.1 cm, previously 4.0 x 3.2 cm. No signs of bone lesion or acute bone finding. Review of the MIP images confirms the above findings. IMPRESSION: 1. No lobar or central pulmonary embolus detected. Exam is limited secondary to respiratory motion. 2. Mild ground-glass attenuation may represent mild pneumonitis or areas of air trapping. 3. Signs of atrial septal closure. 4. Decrease in size and number of bilateral pulmonary nodules, marked response noted on today's exam with the only nodule remaining near a cm in the right upper lobe and the smaller nodules that were present on the previous examination throughout the chest no longer measurable though the lower lobes are limited by respiratory  motion. 5. Decreased size of right breast mass. 6. Probable hepatic steatosis. Aortic Atherosclerosis (ICD10-I70.0). Electronically Signed   By: Zetta Bills M.D.   On: 08/04/2019 17:34   DG Chest Portable 1 View  Result Date: 08/04/2019 CLINICAL DATA:  Shortness of breath, body aches EXAM: PORTABLE CHEST 1 VIEW COMPARISON:  06/06/2019 FINDINGS: The heart size and mediastinal contours are within normal limits. Both lungs are clear. The visualized skeletal structures  are unremarkable. IMPRESSION: No active disease. Electronically Signed   By: Kathreen Devoid   On: 08/04/2019 13:17    Assessment/Plan Present on Admission: . Pneumonitis -Bring in for overnight observation admit telemetry -Blood cultures and urine cultures collected -We will treat with empiric antibiotics even though not indicated for pneumonitis.  Will have speech evaluation to rule out aspiration as patient states she coughs after eating..  We will continue patient's Nexium -Tessalon Perles scheduled ordered, first dose now -Oxygen continue nebulizers as needed  . Breast cancer ([place]) -Stable, patient's home meds continued  . Anxiety disorder -Stable, home meds resumed  Chronic kidney disease Transaminitis -Essentially chronic, stable at baseline  Elany Felix 08/04/2019, 6:59 PM

## 2019-08-04 NOTE — Progress Notes (Signed)
Patient arrived to unit at 2130, able to ambulate from the stretcher to the bed with steady gait, A&Ox4, skin intact, vss, lungs diminished but clear, frequent dry, non-productive cough noted, becomes short of breath with exertion, RT notified for breathing TX, oriented to room, admission completed, evening medications given, ST on the monitor, call bell in reach, will continue to monitor.

## 2019-08-04 NOTE — Progress Notes (Signed)
MEDICATION RELATED CONSULT NOTE - INITIAL   Pharmacy Consult for abemaciclib [facility]) tablet 100 mg  Indication: oral chemo  Allergies  Allergen Reactions  . Zithromax [Azithromycin] Shortness Of Breath and Itching    TOTAL BODY ITCHING [EVEN SOLES OF FEET] WHEEZING   . Psyllium Nausea And Vomiting    Patient Measurements: Height: 5\' 7"  (170.2 cm) Weight: 227 lb (103 kg) IBW/kg (Calculated) : 61.6 Adjusted Body Weight:   Vital Signs: Temp: 98.8 F (37.1 C) (02/28 2143) Temp Source: Oral (02/28 2143) BP: 140/87 (02/28 2143) Pulse Rate: 107 (02/28 2143) Intake/Output from previous day: No intake/output data recorded. Intake/Output from this shift: No intake/output data recorded.  Labs: Recent Labs    08/04/19 1407  WBC 4.9  HGB 11.7*  HCT 36.7  PLT 273  CREATININE 1.28*  ALBUMIN 3.9  PROT 8.3*  AST 72*  ALT 123*  ALKPHOS 171*  BILITOT 0.9   Estimated Creatinine Clearance: 61.3 mL/min (A) (by C-G formula based on SCr of 1.28 mg/dL (H)).   Microbiology: Recent Results (from the past 720 hour(s))  Respiratory Panel by RT PCR (Flu A&B, Covid) - Nasopharyngeal Swab     Status: None   Collection Time: 08/04/19  3:35 PM   Specimen: Nasopharyngeal Swab  Result Value Ref Range Status   SARS Coronavirus 2 by RT PCR NEGATIVE NEGATIVE Final    Comment: (NOTE) SARS-CoV-2 target nucleic acids are NOT DETECTED. The SARS-CoV-2 RNA is generally detectable in upper respiratoy specimens during the acute phase of infection. The lowest concentration of SARS-CoV-2 viral copies this assay can detect is 131 copies/mL. A negative result does not preclude SARS-Cov-2 infection and should not be used as the sole basis for treatment or other patient management decisions. A negative result may occur with  improper specimen collection/handling, submission of specimen other than nasopharyngeal swab, presence of viral mutation(s) within the areas targeted by this assay, and  inadequate number of viral copies (<131 copies/mL). A negative result must be combined with clinical observations, patient history, and epidemiological information. The expected result is Negative. Fact Sheet for Patients:  PinkCheek.be Fact Sheet for Healthcare Providers:  GravelBags.it This test is not yet ap proved or cleared by the [place] FDA and  has been authorized for detection and/or diagnosis of SARS-CoV-2 by FDA under an Emergency Use Authorization (EUA). This EUA will remain  in effect (meaning this test can be used) for the duration of the COVID-19 declaration under Section 564(b)(1) of the Act, 21 U.S.C. section 360bbb-3(b)(1), unless the authorization is terminated or revoked sooner.    Influenza A by PCR NEGATIVE NEGATIVE Final   Influenza B by PCR NEGATIVE NEGATIVE Final    Comment: (NOTE) The Xpert Xpress SARS-CoV-2/FLU/RSV assay is intended as an aid in  the diagnosis of influenza from Nasopharyngeal swab specimens and  should not be used as a sole basis for treatment. Nasal washings and  aspirates are unacceptable for Xpert Xpress SARS-CoV-2/FLU/RSV  testing. Fact Sheet for Patients: PinkCheek.be Fact Sheet for Healthcare Providers: GravelBags.it This test is not yet approved or cleared by the [place] FDA and  has been authorized for detection and/or diagnosis of SARS-CoV-2 by  FDA under an Emergency Use Authorization (EUA). This EUA will remain  in effect (meaning this test can be used) for the duration of the  Covid-19 declaration under Section 564(b)(1) of the Act, 21  U.S.C. section 360bbb-3(b)(1), unless the authorization is  terminated or revoked. Performed at [facility], [place]  [address] [place], [place] [address]     Medical History: Past Medical History:  Diagnosis Date  . Arthritis   . ASD (atrial  septal defect)    s/p closure with Amplatzer device 10/05/04 (Dr. Myriam Jacobson, [facility]) 10/05/04  . Cataract   . GERD (gastroesophageal reflux disease)   . Headache   . Heart murmur    no longer heard  . Legally blind in right eye, as defined in [place]   . Lumbar herniated disc   . Sciatica     Medications:  Facility-Administered Medications Prior to Admission  Medication Dose Route Frequency Provider Last Rate Last Admin  . 0.9 %  sodium chloride infusion  500 mL Intravenous Continuous Nandigam, Kavitha V, MD       Medications Prior to Admission  Medication Sig Dispense Refill Last Dose  . albuterol (VENTOLIN HFA) 108 (90 Base) MCG/ACT inhaler Inhale 2 puffs into the lungs every 6 (six) hours as needed for wheezing or shortness of breath. 18 g 5   . ALPRAZolam (XANAX) 1 MG tablet Take 1 mg by mouth daily.     Marland Kitchen amitriptyline (ELAVIL) 75 MG tablet Take 75 mg by mouth at bedtime.   6   . amoxicillin-clavulanate (AUGMENTIN) 875-125 MG tablet Take 1 tablet by mouth every 12 (twelve) hours. Take 1 tablet by mouth every 12 hours with meals.     . benzonatate (TESSALON) 100 MG capsule Take 1 capsule (100 mg total) by mouth 3 (three) times daily as needed for cough. 60 capsule 0   . chlorpheniramine-HYDROcodone (TUSSIONEX) 10-8 MG/5ML SUER Take 5 mLs by mouth at bedtime as needed for cough. 140 mL 0   . cyclobenzaprine (FLEXERIL) 10 MG tablet Take 1 tablet (10 mg total) by mouth 2 (two) times daily as needed for muscle spasms. 14 tablet 0   . ergocalciferol (VITAMIN D2) 1.25 MG (50000 UT) capsule Take 1 capsule (50,000 Units total) by mouth once a week. 12 capsule 3   . fluticasone (FLOVENT HFA) 110 MCG/ACT inhaler Inhale 2 puffs into the lungs 2 (two) times daily. 1 Inhaler 5   . HYDROcodone-Acetaminophen 7.5-300 MG TABS Take 1 tablet by mouth 3 (three) times daily as needed. 60 tablet 0   . letrozole (FEMARA) 2.5 MG tablet Take 1 tablet (2.5 mg total) by mouth daily. 30 tablet 5   . omeprazole  (PRILOSEC) 10 MG capsule Take by mouth.     . ondansetron (ZOFRAN) 8 MG tablet Take 1 tablet (8 mg total) by mouth every 8 (eight) hours as needed for nausea or vomiting. 30 tablet 3   . sulfamethoxazole-trimethoprim (BACTRIM DS) 800-160 MG tablet Take 1 tablet by mouth 2 (two) times daily. 14 tablet 0   . VERZENIO 100 MG tablet TAKE 1 TABLET (100 MG TOTAL) BY MOUTH 2 (TWO) TIMES DAILY. SWALLOW TABLETS WHOLE. DO NOT CHEW, CRUSH, OR SPLIT TABLETS BEFORE SWALLOWING. (Patient taking differently: Take 100 mg by mouth 2 (two) times daily. ) 28 tablet 2    Scheduled:  . benzonatate  100 mg Oral Q8H  . budesonide (PULMICORT) nebulizer solution  0.25 mg Nebulization BID  . enoxaparin (LOVENOX) injection  40 mg Subcutaneous Q24H  . letrozole  2.5 mg Oral Daily  . pantoprazole  40 mg Oral Daily  . sodium chloride (PF)        Assessment: Patient taking abemaciclib (VERZENIO) tablet 100 mg as oral chemo prior to admission.  Patient with active infection with iv antibiotics.  Goal of Therapy:  Safe and effective use of abemaciclib  Plan:  Hold abemaciclib at this time per policy  Nani Skillern Crowford 08/04/2019,10:34 PM

## 2019-08-04 NOTE — Plan of Care (Signed)

## 2019-08-04 NOTE — ED Notes (Signed)
Patient ambulated in the room and her oxygen sat was 95-96 % on room air. Patient tolerated po fluids ok. No nausea or vomiting.

## 2019-08-05 ENCOUNTER — Other Ambulatory Visit (HOSPITAL_COMMUNITY): Payer: Self-pay

## 2019-08-05 ENCOUNTER — Encounter: Payer: Self-pay | Admitting: Hematology

## 2019-08-05 DIAGNOSIS — C50919 Malignant neoplasm of unspecified site of unspecified female breast: Secondary | ICD-10-CM

## 2019-08-05 DIAGNOSIS — R000 Tachycardia, unspecified: Secondary | ICD-10-CM

## 2019-08-05 DIAGNOSIS — J189 Pneumonia, unspecified organism: Secondary | ICD-10-CM

## 2019-08-05 DIAGNOSIS — R1310 Dysphagia, unspecified: Secondary | ICD-10-CM

## 2019-08-05 LAB — CBC
HCT: 30 % — ABNORMAL LOW (ref 36.0–46.0)
Hemoglobin: 9.9 g/dL — ABNORMAL LOW (ref 12.0–15.0)
MCH: 33.8 pg (ref 26.0–34.0)
MCHC: 33 g/dL (ref 30.0–36.0)
MCV: 102.4 fL — ABNORMAL HIGH (ref 80.0–100.0)
Platelets: 244 10*3/uL (ref 150–400)
RBC: 2.93 MIL/uL — ABNORMAL LOW (ref 3.87–5.11)
RDW: 15.6 % — ABNORMAL HIGH (ref 11.5–15.5)
WBC: 3.4 10*3/uL — ABNORMAL LOW (ref 4.0–10.5)
nRBC: 0 % (ref 0.0–0.2)

## 2019-08-05 LAB — BASIC METABOLIC PANEL
Anion gap: 8 (ref 5–15)
BUN: 10 mg/dL (ref 6–20)
CO2: 28 mmol/L (ref 22–32)
Calcium: 8.6 mg/dL — ABNORMAL LOW (ref 8.9–10.3)
Chloride: 103 mmol/L (ref 98–111)
Creatinine, Ser: 1.2 mg/dL — ABNORMAL HIGH (ref 0.44–1.00)
GFR calc Af Amer: 59 mL/min — ABNORMAL LOW (ref >60)
GFR calc non Af Amer: 51 mL/min — ABNORMAL LOW (ref >60)
Glucose, Bld: 136 mg/dL — ABNORMAL HIGH (ref 70–99)
Potassium: 3.8 mmol/L (ref 3.5–5.1)
Sodium: 139 mmol/L (ref 135–145)

## 2019-08-05 MED ORDER — MUCINEX 600 MG PO TB12: 600.0000 mg | ORAL_TABLET | Freq: Two times a day (BID) | ORAL | 2 refills | Status: AC

## 2019-08-05 MED ORDER — GUAIFENESIN-DM 100-10 MG/5ML PO SYRP
5.0000 mL | ORAL_SOLUTION | ORAL | Status: DC | PRN
  Administered 2019-08-05: 5 mL via ORAL
  Filled 2019-08-05: qty 10

## 2019-08-05 MED ORDER — GUAIFENESIN-DM 100-10 MG/5ML PO SYRP: 10.0000 mL | ORAL_SOLUTION | ORAL | 0 refills | Status: DC | PRN

## 2019-08-05 MED ORDER — SALINE SPRAY 0.65 % NA SOLN
1.0000 | NASAL | Status: DC | PRN
  Administered 2019-08-05: 1 via NASAL
  Filled 2019-08-05: qty 44

## 2019-08-05 MED ORDER — PHENOL 1.4 % MT LIQD
1.0000 | OROMUCOSAL | Status: DC | PRN
  Filled 2019-08-05: qty 177

## 2019-08-05 MED ORDER — HYDROCODONE-HOMATROPINE 5-1.5 MG/5ML PO SYRP: 5.0000 mL | ORAL_SOLUTION | Freq: Four times a day (QID) | ORAL | 0 refills | Status: DC | PRN

## 2019-08-05 NOTE — Progress Notes (Signed)
PT Cancellation Note / SCREEN  Patient Details Name: Brandy [name] MRN: [id number] DOB: 10-07-1965   Cancelled Treatment:    Reason Eval/Treat Not Completed: PT screened, no needs identified, will sign off Pt states she has been up in her room and able to ambulate to bathroom and wash up on her own.  Pt denies PT needs at this time.   Avalene Sealy,KATHrine E 08/05/2019, 2:02 PM Arlyce Dice, DPT Acute Rehabilitation Services Office: [phone]

## 2019-08-05 NOTE — Evaluation (Signed)
Clinical/Bedside Swallow Evaluation Patient Details  Name: Brandy [name] MRN: [id number] Date of Birth: 06-11-1965  Today's Date: 08/05/2019 Time: SLP Start Time (ACUTE ONLY): 1520 SLP Stop Time (ACUTE ONLY): 1535 SLP Time Calculation (min) (ACUTE ONLY): 15 min  Past Medical History:  Past Medical History:  Diagnosis Date  . Arthritis   . ASD (atrial septal defect)    s/p closure with Amplatzer device 10/05/04 (Dr. Myriam Jacobson, [facility]) 10/05/04  . Cataract   . GERD (gastroesophageal reflux disease)   . Headache   . Heart murmur    no longer heard  . Legally blind in right eye, as defined in [place]   . Lumbar herniated disc   . Sciatica    Past Surgical History:  Past Surgical History:  Procedure Laterality Date  . ABLATION    . BREAST SURGERY Bilateral 2011   Breast Reduction Surgery  . BUNIONECTOMY    . CARDIAC CATHETERIZATION     10/05/04 [facility]): LM < 25%, otherwise normal coronaries. No pulmonary HTN, Mildly enlarged RV. Secundum ASD s/p closure.  Marland Kitchen CARDIAC SURGERY    . CATARACT EXTRACTION    . CLEFT PALATE REPAIR     s/p cleft lip and palate repair  . LUMBAR LAMINECTOMY/DECOMPRESSION MICRODISCECTOMY Left 05/23/2016   Procedure: LEFT L4-L5 LATERAL RECESS DECOMPRESSION WITH CENTRAL AND RIGHT DECOMPRESSION VIA LEFT SIDE;  Surgeon: [name], MD;  Location: [place];  Service: Orthopedics;  Laterality: Left;  . LUMBAR LAMINECTOMY/DECOMPRESSION MICRODISCECTOMY Left 05/23/2016   Procedure: LUMBAR LAMINECTOMY/DECOMPRESSION MICRODISCECTOMY Lumbar five - Sacral One 1 LEVEL;  Surgeon: [name], MD;  Location: [place];  Service: Orthopedics;  Laterality: Left;  . SHOULDER INJECTION Left 05/23/2016   Procedure: SHOULDER INJECTION;  Surgeon: [name], MD;  Location: [place];  Service: Orthopedics;  Laterality: Left;  band-aid per pa-c  . TRANSTHORACIC ECHOCARDIOGRAM     12/15/05 [facility]): Mild LVH, EF > [id number], grade 1 diastolic dysfunction, Trivial MR/PR/TR.   HPI:  54 yo  female adm to [facility] with pneumonitis.  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  Pt complained of dysphagia - coughing/choking on liquids recently - over the last 2 weeks to one month.  MD ordered swallow evaluation.   Assessment / Plan / Recommendation Clinical Impression  Patient presents with clinical indications concerning for possible pharyngeal dysphagia.  She has h/o cleft lip/palate s/p repair however experiences nasal loss of air with attempts to contain air orally.  SlP questions if this leak may contribute to residuals in pharynx and/or aspiration.  Excessive coughing noted post-swallow of solids - after which pt states she has trouble with crumbly foods.    MBS unable to be completed today therefore pt tentatively scheduled as OP for Friday March 5th at 1300.  Order obtained and sent to rehab secretary for placement and pt/spouse advised.  Advised the pt consume cohesive foods if decreases coughing.  Pt informed using teach back.  SLP also advised if PPI is ordered for pt to take it. SLP Visit Diagnosis: Dysphagia, unspecified (R13.10);Dysphagia, pharyngeal phase (R13.13)    Aspiration Risk  Mild aspiration risk    Diet Recommendation Regular;Thin liquid   Liquid Administration via: Cup;Straw Medication Administration: Whole meds with liquid Supervision: Patient able to self feed Compensations: Slow rate;Small sips/bites Postural Changes: Seated upright at 90 degrees;Remain upright for at least 30 minutes after  po intake    Other  Recommendations Oral Care Recommendations: Oral care BID   Follow up Recommendations (OP MBS)      Frequency and Duration      n/a      Prognosis   n/a     Swallow Study   General Date of Onset: 08/05/19 HPI: 54 yo female adm to [facility] with pneumonitis.  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  Pt complained of dysphagia - coughing/choking on liquids recently - over the last 2 weeks to one month.  MD ordered swallow evaluation. Type of Study: Bedside Swallow Evaluation Previous Swallow Assessment: none in system Diet Prior to this Study: Regular;Thin liquids Temperature Spikes Noted: No Respiratory Status: Room air History of Recent Intubation: No Behavior/Cognition: Alert;Cooperative;Pleasant mood Oral Cavity Assessment: Other (comment)(note pt has cleft palate, reports she must use caution with secretions, etc) Oral Care Completed by SLP: No Oral Cavity - Dentition: Adequate natural dentition;Other (Comment)(some dentition missing) Vision: Functional for self-feeding Self-Feeding Abilities: Able to feed self Patient Positioning: Upright in bed Baseline Vocal Quality: Normal Volitional Cough: Strong Volitional Swallow: Able to elicit    Oral/Motor/Sensory Function Overall Oral Motor/Sensory Function: Moderate impairment Facial ROM: Reduced right Facial Symmetry: Abnormal symmetry right Facial Strength: Within Functional Limits Facial Sensation: Reduced right;Other (Comment)(h/o cleft palate and labia) Lingual ROM: Within Functional Limits Lingual Symmetry: Within Functional Limits Lingual Strength: Within Functional Limits Lingual Sensation: Within Functional Limits Velum: (pt is hypernasal, loss of air nasally with plosive sounds and attempts to contain air orally) Mandible: Within Functional Limits   Ice Chips Ice chips: Not tested   Thin Liquid Thin Liquid: Within functional limits Presentation: Cup Other Comments: pt able to swallow 3 ounces of water sequentially without cough immediate post-swallow    Nectar Thick Nectar Thick Liquid: Not tested   Honey Thick Honey Thick Liquid: Not tested   Puree Puree: Within functional  limits Presentation: Self Fed;Spoon   Solid     Solid: Impaired Presentation: Self Fed Pharyngeal Phase Impairments: Cough - Immediate Other Comments: pt reports frequent coughing/choking with particulate matter      Macario Golds 08/05/2019,4:13 PM  Kathleen Lime, MS [place] Office [phone]

## 2019-08-05 NOTE — Discharge Summary (Signed)
Physician Discharge Summary  [place] [id number] DOB: Oct 16, 1965 DOA: 08/04/2019  PCP: [facility]  Admit date: 08/04/2019 Discharge date: 08/05/2019  Admitted From: Home Disposition: Home Recommendations for Outpatient Follow-up:  1. Follow up with PCP in 1-2 weeks 2. Please obtain BMP/CBC in one week  Home Health: None Equipment/Devices none Discharge Condition: Stable and improved CODE STATUS: Full code Diet recommendation: Cardiac diet Brief/Interim Summary:54 year old female who was diagnosed with breast cancer in September 2021.  She is on oral chemotherapy daily.  She takes letrozole daily and Verzenio twice daily.  She has never been on active chemotherapy. Since Tuesday the patient reports she has been getting significant chills and intractable coughing spells that would not let up.  Her entire body began to hurt including her head and diaphragm.  She has SOB with the coughing spell. Her cough is nonproductive. 1 day ago she had blood tinged sputum, this has not recurred. She states she has not been having fevers before today. She reports occasional wheezing. Patient coughs after eating or drinking. She reports having heartburn for which she takes OTC nexium. It corrects her GERD symptoms. She received her second covid shot yesterday but the symptoms were going on from Tuesday. She was doing okay but decided to go to urgent care today.  At the urgent care she was febrile to 103.  She was redirected to the ER.  In the ER she was tachycardic up to 120 and tachypneic.  She does not have oxygen need.  Her CT chest pneumonitis but no active pneumonia.  It is negative for pulmonary embolism.  Discharge Diagnoses:  Active Problems:   Anxiety disorder   Pneumonitis   Breast cancer ([place])   #1 pneumonitis-CT findings consistent with groundglass appearance likely pneumonitis.  Covid negative.  Patient started on chemotherapy recently for breast cancer.  She  reports she coughs when she drinks and eats.  Speech therapy was consulted. I have given her a prescription for Hycodan cough syrup.  She also received her first dose of Covid vaccine. Follow up as an out patient for MBS to rule out pharyngeal dysphagia with history of cervical spondylosis and cleft palate repair  Which has been arranged prior to discharge.  #2 history of breast cancer follow-up with oncology  #3 CKD stage 2 stable  Estimated body mass index is 35.55 kg/m as calculated from the following:   Height as of this encounter: 5\' 7"  (1.702 m).   Weight as of this encounter: 103 kg.  Discharge Instructions   Allergies as of 08/05/2019      Reactions   Zithromax [azithromycin] Shortness Of Breath, Itching   TOTAL BODY ITCHING [EVEN SOLES OF FEET] WHEEZING   Psyllium Nausea And Vomiting      Medication List    STOP taking these medications   sulfamethoxazole-trimethoprim 800-160 MG tablet Commonly known as: BACTRIM DS     TAKE these medications   albuterol 108 (90 Base) MCG/ACT inhaler Commonly known as: VENTOLIN HFA Inhale 2 puffs into the lungs every 6 (six) hours as needed for wheezing or shortness of breath.   ALPRAZolam 1 MG tablet Commonly known as: XANAX Take 1 mg by mouth daily as needed for anxiety.   amitriptyline 75 MG tablet Commonly known as: ELAVIL Take 75 mg by mouth at bedtime.   benzonatate 100 MG capsule Commonly known as: TESSALON Take 1 capsule (100 mg total) by mouth 3 (three) times daily as needed for cough.   chlorpheniramine-HYDROcodone 10-8  MG/5ML Suer Commonly known as: TUSSIONEX Take 5 mLs by mouth at bedtime as needed for cough.   cyclobenzaprine 10 MG tablet Commonly known as: FLEXERIL Take 1 tablet (10 mg total) by mouth 2 (two) times daily as needed for muscle spasms.   ergocalciferol 1.25 MG (50000 UT) capsule Commonly known as: VITAMIN D2 Take 1 capsule (50,000 Units total) by mouth once a week.   Flovent HFA 110 MCG/ACT  inhaler Generic drug: fluticasone Inhale 2 puffs into the lungs 2 (two) times daily.   guaiFENesin-dextromethorphan 100-10 MG/5ML syrup Commonly known as: ROBITUSSIN DM Take 10 mLs by mouth every 4 (four) hours as needed for cough.   HYDROcodone-Acetaminophen 7.5-300 MG Tabs Take 1 tablet by mouth 3 (three) times daily as needed.   letrozole 2.5 MG tablet Commonly known as: FEMARA Take 1 tablet (2.5 mg total) by mouth daily.   Mucinex 600 MG 12 hr tablet Generic drug: guaiFENesin Take 1 tablet (600 mg total) by mouth 2 (two) times daily.   omeprazole 10 MG capsule Commonly known as: PRILOSEC Take 10 mg by mouth daily as needed (heartburn).   ondansetron 8 MG tablet Commonly known as: ZOFRAN Take 1 tablet (8 mg total) by mouth every 8 (eight) hours as needed for nausea or vomiting.   Verzenio 100 MG tablet Generic drug: abemaciclib TAKE 1 TABLET (100 MG TOTAL) BY MOUTH 2 (TWO) TIMES DAILY. SWALLOW TABLETS WHOLE. DO NOT CHEW, CRUSH, OR SPLIT TABLETS BEFORE SWALLOWING. What changed: See the new instructions.      Follow-up [facility] Follow up.   Contact information: [address] [place] [address] [phone]          Allergies  Allergen Reactions  . Zithromax [Azithromycin] Shortness Of Breath and Itching    TOTAL BODY ITCHING [EVEN SOLES OF FEET] WHEEZING   . Psyllium Nausea And Vomiting    Consultations: Speech therapy   Procedures/Studies: CT Angio Chest PE W and/or Wo Contrast  Result Date: 08/04/2019 CLINICAL DATA:  PE suspected EXAM: CT ANGIOGRAPHY CHEST WITH CONTRAST TECHNIQUE: Multidetector CT imaging of the chest was performed using the standard protocol during bolus administration of intravenous contrast. Multiplanar CT image reconstructions and MIPs were obtained to evaluate the vascular anatomy. CONTRAST:  71mL OMNIPAQUE IOHEXOL 350 MG/ML SOLN COMPARISON:  02/28/2019 FINDINGS: Cardiovascular: Mild  atheromatous plaque in the thoracic aorta. Signs of atrial septal closure. Heart size remains stable. No pericardial effusion. Study is motion limited with respect to lower lobe branches due to respiratory motion. No lobar or central pulmonary embolus. Study is also limited secondary to bolus timing. Mediastinum/Nodes: Normal thoracic inlet. No signs of axillary lymphadenopathy. No mediastinal adenopathy. No signs of hilar lymphadenopathy. Esophagus is grossly normal. Lungs/Pleura: Signs of basilar atelectasis. Pulmonary nodule in the right upper lobe (image 22, series 6) this measures 9 x 9 mm on today's study, previously 15 mm in greatest axial dimension. Similar decrease in size of numerous pulmonary nodules that were seen on the prior examination with a centrally near complete resolution of left upper lobe nodules since the previous exam. Subtle scattered ground-glass attenuation is nonspecific, no consolidation. No signs of pleural effusion. Airways are patent. Upper Abdomen: Low-attenuation in hepatic parenchyma likely reflects steatosis. Lobular hepatic contours. No acute upper abdominal process. Musculoskeletal: Right breast mass is less discrete with signs of spiculation extending into breast fat. Area measures 3.0 x 2.1 cm, previously 4.0 x 3.2 cm. No signs of bone lesion or  acute bone finding. Review of the MIP images confirms the above findings. IMPRESSION: 1. No lobar or central pulmonary embolus detected. Exam is limited secondary to respiratory motion. 2. Mild ground-glass attenuation may represent mild pneumonitis or areas of air trapping. 3. Signs of atrial septal closure. 4. Decrease in size and number of bilateral pulmonary nodules, marked response noted on today's exam with the only nodule remaining near a cm in the right upper lobe and the smaller nodules that were present on the previous examination throughout the chest no longer measurable though the lower lobes are limited by respiratory  motion. 5. Decreased size of right breast mass. 6. Probable hepatic steatosis. Aortic Atherosclerosis (ICD10-I70.0). Electronically Signed   By: Zetta Bills M.D.   On: 08/04/2019 17:34   DG Chest Portable 1 View  Result Date: 08/04/2019 CLINICAL DATA:  Shortness of breath, body aches EXAM: PORTABLE CHEST 1 VIEW COMPARISON:  06/06/2019 FINDINGS: The heart size and mediastinal contours are within normal limits. Both lungs are clear. The visualized skeletal structures are unremarkable. IMPRESSION: No active disease. Electronically Signed   By: Kathreen Devoid   On: 08/04/2019 13:17    (Echo, Carotid, EGD, Colonoscopy, ERCP)    Subjective: C/o cough with eating and drinking  Discharge Exam: Vitals:   08/05/19 0632 08/05/19 0812  BP: (!) 146/64   Pulse: (!) 101   Resp: 20   Temp: 98.2 F (36.8 C)   SpO2: 98% 93%   Vitals:   08/04/19 2237 08/05/19 0527 08/05/19 0632 08/05/19 0812  BP:   (!) 146/64   Pulse:   (!) 101   Resp:   20   Temp:   98.2 F (36.8 C)   TempSrc:   Oral   SpO2: 100% 100% 98% 93%  Weight:      Height:        General: Pt is alert, awake, not in acute distress Cardiovascular: RRR, S1/S2 +, no rubs, no gallops Respiratory: few scattered wheezes  bilaterally, no wheezing, no rhonchi Abdominal: Soft, NT, ND, bowel sounds + Extremities: no edema, no cyanosis    The results of significant diagnostics from this hospitalization (including imaging, microbiology, ancillary and laboratory) are listed below for reference.     Microbiology: Recent Results (from the past 240 hour(s))  Blood Culture (routine x 2)     Status: None (Preliminary result)   Collection Time: 08/04/19  2:07 PM   Specimen: BLOOD  Result Value Ref Range Status   Specimen Description   Final    BLOOD LEFT ANTECUBITAL Performed at [facility] [address] [place], [place] [address]    Special Requests   Final    BOTTLES DRAWN AEROBIC AND ANAEROBIC Blood Culture  results may not be optimal due to an excessive volume of blood received in culture bottles Performed at [facility] [address], [place], [place] [address]    Culture   Final    NO GROWTH < 24 HOURS Performed at [facility] [address], [place], [place] [address]    Report Status PENDING  Incomplete  Blood Culture (routine x 2)     Status: None (Preliminary result)   Collection Time: 08/04/19  2:10 PM   Specimen: BLOOD LEFT HAND  Result Value Ref Range Status   Specimen Description BLOOD LEFT HAND  Final   Special Requests   Final    BOTTLES DRAWN AEROBIC AND ANAEROBIC Blood Culture adequate volume   Culture  Final    NO GROWTH < 24 HOURS Performed at [facility], [place] [address], [place], [place] [address]    Report Status PENDING  Incomplete  Respiratory Panel by RT PCR (Flu A&B, Covid) - Nasopharyngeal Swab     Status: None   Collection Time: 08/04/19  3:35 PM   Specimen: Nasopharyngeal Swab  Result Value Ref Range Status   SARS Coronavirus 2 by RT PCR NEGATIVE NEGATIVE Final    Comment: (NOTE) SARS-CoV-2 target nucleic acids are NOT DETECTED. The SARS-CoV-2 RNA is generally detectable in upper respiratoy specimens during the acute phase of infection. The lowest concentration of SARS-CoV-2 viral copies this assay can detect is 131 copies/mL. A negative result does not preclude SARS-Cov-2 infection and should not be used as the sole basis for treatment or other patient management decisions. A negative result may occur with  improper specimen collection/handling, submission of specimen other than nasopharyngeal swab, presence of viral mutation(s) within the areas targeted by this assay, and inadequate number of viral copies (<131 copies/mL). A negative result must be combined with clinical observations, patient history, and epidemiological information. The expected result is Negative. Fact Sheet for Patients:   PinkCheek.be Fact Sheet for Healthcare Providers:  GravelBags.it This test is not yet ap proved or cleared by the [place] FDA and  has been authorized for detection and/or diagnosis of SARS-CoV-2 by FDA under an Emergency Use Authorization (EUA). This EUA will remain  in effect (meaning this test can be used) for the duration of the COVID-19 declaration under Section 564(b)(1) of the Act, 21 U.S.C. section 360bbb-3(b)(1), unless the authorization is terminated or revoked sooner.    Influenza A by PCR NEGATIVE NEGATIVE Final   Influenza B by PCR NEGATIVE NEGATIVE Final    Comment: (NOTE) The Xpert Xpress SARS-CoV-2/FLU/RSV assay is intended as an aid in  the diagnosis of influenza from Nasopharyngeal swab specimens and  should not be used as a sole basis for treatment. Nasal washings and  aspirates are unacceptable for Xpert Xpress SARS-CoV-2/FLU/RSV  testing. Fact Sheet for Patients: PinkCheek.be Fact Sheet for Healthcare Providers: GravelBags.it This test is not yet approved or cleared by the [place] FDA and  has been authorized for detection and/or diagnosis of SARS-CoV-2 by  FDA under an Emergency Use Authorization (EUA). This EUA will remain  in effect (meaning this test can be used) for the duration of the  Covid-19 declaration under Section 564(b)(1) of the Act, 21  U.S.C. section 360bbb-3(b)(1), unless the authorization is  terminated or revoked. Performed at [facility], [place] [address], [place], [place] [address]      Labs: BNP (last 3 results) No results for input(s): BNP in the last 8760 hours. Basic Metabolic Panel: Recent Labs  Lab 08/04/19 1407 08/05/19 0520  NA 135 139  K 4.0 3.8  CL 94* 103  CO2 29 28  GLUCOSE 99 136*  BUN 10 10  CREATININE 1.28* 1.20*  CALCIUM 9.0 8.6*   Liver Function Tests: Recent  Labs  Lab 08/04/19 1407  AST 72*  ALT 123*  ALKPHOS 171*  BILITOT 0.9  PROT 8.3*  ALBUMIN 3.9   No results for input(s): LIPASE, AMYLASE in the last 168 hours. No results for input(s): AMMONIA in the last 168 hours. CBC: Recent Labs  Lab 08/04/19 1407 08/05/19 0520  WBC 4.9 3.4*  NEUTROABS 2.8  --   HGB 11.7* 9.9*  HCT 36.7 30.0*  MCV 104.9* 102.4*  PLT 273  244   Cardiac Enzymes: No results for input(s): CKTOTAL, CKMB, CKMBINDEX, TROPONINI in the last 168 hours. BNP: Invalid input(s): POCBNP CBG: No results for input(s): GLUCAP in the last 168 hours. D-Dimer Recent Labs    08/04/19 1407  DDIMER 1.11*   Hgb A1c No results for input(s): HGBA1C in the last 72 hours. Lipid Profile Recent Labs    08/04/19 1407  TRIG 301*   Thyroid function studies No results for input(s): TSH, T4TOTAL, T3FREE, THYROIDAB in the last 72 hours.  Invalid input(s): FREET3 Anemia work up Recent Labs    08/04/19 1306  FERRITIN 163   Urinalysis    Component Value Date/Time   COLORURINE YELLOW 02/22/2019 1430   APPEARANCEUR HAZY (A) 02/22/2019 1430   LABSPEC 1.010 02/22/2019 1430   PHURINE 5.0 02/22/2019 1430   GLUCOSEU NEGATIVE 02/22/2019 1430   HGBUR SMALL (A) 02/22/2019 1430   BILIRUBINUR NEGATIVE 02/22/2019 1430   KETONESUR NEGATIVE 02/22/2019 1430   PROTEINUR NEGATIVE 02/22/2019 1430   UROBILINOGEN 1.0 02/22/2015 2105   NITRITE NEGATIVE 02/22/2019 1430   LEUKOCYTESUR TRACE (A) 02/22/2019 1430   Sepsis Labs Invalid input(s): PROCALCITONIN,  WBC,  LACTICIDVEN Microbiology Recent Results (from the past 240 hour(s))  Blood Culture (routine x 2)     Status: None (Preliminary result)   Collection Time: 08/04/19  2:07 PM   Specimen: BLOOD  Result Value Ref Range Status   Specimen Description   Final    BLOOD LEFT ANTECUBITAL Performed at [facility], [place] [address] [place], [place] [address]    Special Requests   Final    BOTTLES DRAWN AEROBIC  AND ANAEROBIC Blood Culture results may not be optimal due to an excessive volume of blood received in culture bottles Performed at [facility] [address] [place], [place] [address]    Culture   Final    NO GROWTH < 24 HOURS Performed at [facility] [address], [place], [place] [address]    Report Status PENDING  Incomplete  Blood Culture (routine x 2)     Status: None (Preliminary result)   Collection Time: 08/04/19  2:10 PM   Specimen: BLOOD LEFT HAND  Result Value Ref Range Status   Specimen Description BLOOD LEFT HAND  Final   Special Requests   Final    BOTTLES DRAWN AEROBIC AND ANAEROBIC Blood Culture adequate volume   Culture   Final    NO GROWTH < 24 HOURS Performed at [facility], [place] [address], [place], [place] [address]    Report Status PENDING  Incomplete  Respiratory Panel by RT PCR (Flu A&B, Covid) - Nasopharyngeal Swab     Status: None   Collection Time: 08/04/19  3:35 PM   Specimen: Nasopharyngeal Swab  Result Value Ref Range Status   SARS Coronavirus 2 by RT PCR NEGATIVE NEGATIVE Final    Comment: (NOTE) SARS-CoV-2 target nucleic acids are NOT DETECTED. The SARS-CoV-2 RNA is generally detectable in upper respiratoy specimens during the acute phase of infection. The lowest concentration of SARS-CoV-2 viral copies this assay can detect is 131 copies/mL. A negative result does not preclude SARS-Cov-2 infection and should not be used as the sole basis for treatment or other patient management decisions. A negative result may occur with  improper specimen collection/handling, submission of specimen other than nasopharyngeal swab, presence of viral mutation(s) within the areas targeted by this assay, and inadequate number of viral copies (<131 copies/mL). A negative result  must be combined with clinical observations, patient history, and epidemiological information. The expected result is Negative. Fact Sheet for  Patients:  PinkCheek.be Fact Sheet for Healthcare Providers:  GravelBags.it This test is not yet ap proved or cleared by the [place] FDA and  has been authorized for detection and/or diagnosis of SARS-CoV-2 by FDA under an Emergency Use Authorization (EUA). This EUA will remain  in effect (meaning this test can be used) for the duration of the COVID-19 declaration under Section 564(b)(1) of the Act, 21 U.S.C. section 360bbb-3(b)(1), unless the authorization is terminated or revoked sooner.    Influenza A by PCR NEGATIVE NEGATIVE Final   Influenza B by PCR NEGATIVE NEGATIVE Final    Comment: (NOTE) The Xpert Xpress SARS-CoV-2/FLU/RSV assay is intended as an aid in  the diagnosis of influenza from Nasopharyngeal swab specimens and  should not be used as a sole basis for treatment. Nasal washings and  aspirates are unacceptable for Xpert Xpress SARS-CoV-2/FLU/RSV  testing. Fact Sheet for Patients: PinkCheek.be Fact Sheet for Healthcare Providers: GravelBags.it This test is not yet approved or cleared by the [place] FDA and  has been authorized for detection and/or diagnosis of SARS-CoV-2 by  FDA under an Emergency Use Authorization (EUA). This EUA will remain  in effect (meaning this test can be used) for the duration of the  Covid-19 declaration under Section 564(b)(1) of the Act, 21  U.S.C. section 360bbb-3(b)(1), unless the authorization is  terminated or revoked. Performed at [facility], [place] [address], [place], [place] [address]      Time coordinating discharge:  37 minutes  SIGNED:   Georgette Shell, MD  [facility] 08/05/2019, 2:12 PM Pager   If 7PM-7AM, please contact night-coverage [email or web address] Password [email or web address]

## 2019-08-05 NOTE — Plan of Care (Signed)
Discharge instructions reviewed with patient, questions answered, verbalized understanding.  Patient transported to main entrance via wheelchair to be taken home by spouse.

## 2019-08-08 ENCOUNTER — Inpatient Hospital Stay: Payer: BC Managed Care – PPO

## 2019-08-08 ENCOUNTER — Inpatient Hospital Stay: Payer: BC Managed Care – PPO | Admitting: Hematology

## 2019-08-09 ENCOUNTER — Other Ambulatory Visit: Payer: Self-pay

## 2019-08-09 ENCOUNTER — Ambulatory Visit (HOSPITAL_COMMUNITY)
Admit: 2019-08-09 | Discharge: 2019-08-09 | Disposition: A | Discharge status: Home / Self Care | Payer: BC Managed Care – PPO | Attending: Internal Medicine | Admitting: Internal Medicine

## 2019-08-09 ENCOUNTER — Ambulatory Visit (HOSPITAL_COMMUNITY)
Admission: RE | Admit: 2019-08-09 | Discharge: 2019-08-09 | Disposition: A | Discharge status: Home / Self Care | Payer: BC Managed Care – PPO | Source: Ambulatory Visit | Attending: Internal Medicine | Admitting: Internal Medicine

## 2019-08-09 DIAGNOSIS — R1310 Dysphagia, unspecified: Secondary | ICD-10-CM | POA: Insufficient documentation

## 2019-08-09 LAB — CULTURE, BLOOD (ROUTINE X 2)
Culture: NO GROWTH
Culture: NO GROWTH
Special Requests: ADEQUATE

## 2019-08-13 ENCOUNTER — Telehealth: Payer: Self-pay | Admitting: Hematology

## 2019-08-13 NOTE — Telephone Encounter (Signed)
Rescheduled per 3/9 sch msg, pt req. Called and spoke with pt, confirmed 3/16 appt

## 2019-08-19 ENCOUNTER — Other Ambulatory Visit: Payer: Self-pay | Admitting: Hematology

## 2019-08-20 ENCOUNTER — Other Ambulatory Visit: Payer: Self-pay

## 2019-08-20 ENCOUNTER — Encounter: Payer: Self-pay | Admitting: Hematology

## 2019-08-20 ENCOUNTER — Inpatient Hospital Stay (HOSPITAL_BASED_OUTPATIENT_CLINIC_OR_DEPARTMENT_OTHER): Payer: BC Managed Care – PPO | Admitting: Hematology

## 2019-08-20 ENCOUNTER — Inpatient Hospital Stay: Payer: BC Managed Care – PPO | Attending: Hematology

## 2019-08-20 ENCOUNTER — Other Ambulatory Visit: Payer: Self-pay | Admitting: Hematology

## 2019-08-20 VITALS — BP 140/81 | HR 118 | Temp 97.9°F | Resp 18 | Ht 67.0 in | Wt 226.9 lb

## 2019-08-20 DIAGNOSIS — Z801 Family history of malignant neoplasm of trachea, bronchus and lung: Secondary | ICD-10-CM | POA: Insufficient documentation

## 2019-08-20 DIAGNOSIS — Z833 Family history of diabetes mellitus: Secondary | ICD-10-CM | POA: Insufficient documentation

## 2019-08-20 DIAGNOSIS — M25562 Pain in left knee: Secondary | ICD-10-CM | POA: Diagnosis not present

## 2019-08-20 DIAGNOSIS — C50911 Malignant neoplasm of unspecified site of right female breast: Secondary | ICD-10-CM | POA: Diagnosis not present

## 2019-08-20 DIAGNOSIS — C7801 Secondary malignant neoplasm of right lung: Secondary | ICD-10-CM | POA: Insufficient documentation

## 2019-08-20 DIAGNOSIS — Z8249 Family history of ischemic heart disease and other diseases of the circulatory system: Secondary | ICD-10-CM | POA: Diagnosis not present

## 2019-08-20 DIAGNOSIS — M25552 Pain in left hip: Secondary | ICD-10-CM | POA: Insufficient documentation

## 2019-08-20 DIAGNOSIS — R7989 Other specified abnormal findings of blood chemistry: Secondary | ICD-10-CM

## 2019-08-20 DIAGNOSIS — Z7289 Other problems related to lifestyle: Secondary | ICD-10-CM | POA: Diagnosis not present

## 2019-08-20 DIAGNOSIS — C7802 Secondary malignant neoplasm of left lung: Secondary | ICD-10-CM | POA: Diagnosis present

## 2019-08-20 DIAGNOSIS — M545 Low back pain: Secondary | ICD-10-CM | POA: Insufficient documentation

## 2019-08-20 DIAGNOSIS — Z800 Family history of malignant neoplasm of digestive organs: Secondary | ICD-10-CM | POA: Insufficient documentation

## 2019-08-20 DIAGNOSIS — M25561 Pain in right knee: Secondary | ICD-10-CM | POA: Diagnosis not present

## 2019-08-20 DIAGNOSIS — R945 Abnormal results of liver function studies: Secondary | ICD-10-CM

## 2019-08-20 DIAGNOSIS — K760 Fatty (change of) liver, not elsewhere classified: Secondary | ICD-10-CM | POA: Diagnosis not present

## 2019-08-20 DIAGNOSIS — C50919 Malignant neoplasm of unspecified site of unspecified female breast: Secondary | ICD-10-CM

## 2019-08-20 DIAGNOSIS — I700 Atherosclerosis of aorta: Secondary | ICD-10-CM | POA: Diagnosis not present

## 2019-08-20 DIAGNOSIS — M25551 Pain in right hip: Secondary | ICD-10-CM | POA: Insufficient documentation

## 2019-08-20 DIAGNOSIS — F17210 Nicotine dependence, cigarettes, uncomplicated: Secondary | ICD-10-CM | POA: Diagnosis not present

## 2019-08-20 LAB — CBC WITH DIFFERENTIAL/PLATELET
Abs Immature Granulocytes: 0.01 10*3/uL (ref 0.00–0.07)
Basophils Absolute: 0.1 10*3/uL (ref 0.0–0.1)
Basophils Relative: 1 %
Eosinophils Absolute: 0.1 10*3/uL (ref 0.0–0.5)
Eosinophils Relative: 1 %
HCT: 35.7 % — ABNORMAL LOW (ref 36.0–46.0)
Hemoglobin: 11.8 g/dL — ABNORMAL LOW (ref 12.0–15.0)
Immature Granulocytes: 0 %
Lymphocytes Relative: 54 %
Lymphs Abs: 2.7 10*3/uL (ref 0.7–4.0)
MCH: 33.8 pg (ref 26.0–34.0)
MCHC: 33.1 g/dL (ref 30.0–36.0)
MCV: 102.3 fL — ABNORMAL HIGH (ref 80.0–100.0)
Monocytes Absolute: 0.2 10*3/uL (ref 0.1–1.0)
Monocytes Relative: 5 %
Neutro Abs: 2 10*3/uL (ref 1.7–7.7)
Neutrophils Relative %: 39 %
Platelets: 291 10*3/uL (ref 150–400)
RBC: 3.49 MIL/uL — ABNORMAL LOW (ref 3.87–5.11)
RDW: 14.6 % (ref 11.5–15.5)
WBC: 5 10*3/uL (ref 4.0–10.5)
nRBC: 0 % (ref 0.0–0.2)

## 2019-08-20 LAB — CMP (CANCER CENTER ONLY)
ALT: 84 U/L — ABNORMAL HIGH (ref 0–44)
AST: 60 U/L — ABNORMAL HIGH (ref 15–41)
Albumin: 3.6 g/dL (ref 3.5–5.0)
Alkaline Phosphatase: 164 U/L — ABNORMAL HIGH (ref 38–126)
Anion gap: 11 (ref 5–15)
BUN: 12 mg/dL (ref 6–20)
CO2: 29 mmol/L (ref 22–32)
Calcium: 9.5 mg/dL (ref 8.9–10.3)
Chloride: 104 mmol/L (ref 98–111)
Creatinine: 1.35 mg/dL — ABNORMAL HIGH (ref 0.44–1.00)
GFR, Est AFR Am: 51 mL/min — ABNORMAL LOW (ref >60)
GFR, Estimated: 44 mL/min — ABNORMAL LOW (ref >60)
Glucose, Bld: 120 mg/dL — ABNORMAL HIGH (ref 70–99)
Potassium: 4.1 mmol/L (ref 3.5–5.1)
Sodium: 144 mmol/L (ref 135–145)
Total Bilirubin: 0.5 mg/dL (ref 0.3–1.2)
Total Protein: 8 g/dL (ref 6.5–8.1)

## 2019-08-20 LAB — MAGNESIUM: Magnesium: 1.3 mg/dL — CL (ref 1.7–2.4)

## 2019-08-20 NOTE — Progress Notes (Signed)
HEMATOLOGY/ONCOLOGY CLINIC NOTE  Date of Service: 08/20/2019  Patient Care Team: [facility] as PCP - General Everardo Beals, NP as Nurse Practitioner [facility]  CHIEF COMPLAINTS/PURPOSE OF CONSULTATION:  -f/u for metastatic breast cancer   HISTORY OF PRESENTING ILLNESS:  Brandy [name] is a wonderful 54 y.o. female who has been referred to [place] by [facility], [facility] for evaluation and management of her suspected breast cancer.   The pt reports that she was having increasing low back pain on her right side that would not resolve, so she went to the ED. This pain has not resolved. She notes pain in her hips and knees.   Of note prior to the patient's visit today, pt has had an abdominal limited right upper ultrasound completed on 02/18/2019 with results revealing "Probable hepatic steatosis. No other abnormality seen in the right upper quadrant of the abdomen."  Pt had abdomen and pelvis CT completed on 02/18/2019 with results revealing "5cm spiculated soft tissue mass in inferior right breast, highly suspicious for primary breast carcinoma. No acute findings or metastatic disease within the abdomen or pelvis. Multiple small pulmonary nodules in both lung bases, consistent with pulmonary metastases. 4.5 cm uterine fibroid."  Most recent lab results (02/18/2019) of CBC is as follows: all values are WNL except for glucose at 121, calcium at 8.7, AST at 49, and ALT at 53.  On review of systems, pt reports pedal edema and denies chest pain, shortness of breath, weight loss, and any other symptoms.  Her last known mammography was on 06/18/2012.   On Social Hx the pt reports that she is a smoker and she smokes about half a pack per day. She does not drink alcohol. She has three children: age 15, 58, and 29.  On Family Hx the pt reports lung cancer. Maternal Cousin passed from stomach cancer at the age of 40.    INTERVAL HISTORY:  Brandy [name]  is a 54 y.o. female here for evaluation and management of likely metastatic breast cancer. The patient's last visit with [place] was on 02/03/201. The pt reports that she is doing well overall.  The pt reports that her cough got worse over the course of a week while she was also having fevers. During this time pt also received her second COVID19 vaccine. When she went to Urgent Care her heart rate was elevated and her fever was at 103F. Her cough was so bad at this time that she couldn't talk and was SOB.  She was then referred to the ED for treatment and noted that Robotussin PM helped the most. Since discharge she has been taking hydrocodone cough syrup 5 mL at most once per day, but is only using it as needed. She is bringing up phlegm when she coughs and is also taking Mucinex. When she is able to drink more water the phlegm is thinner. She is no longer having fevers. She has continued using Flovent but has not used Albuterol for some time. While in the hospital she was given nebulizer treatments, which improved her cough.   Pt had not had a bowel movement over the last two weeks, but finally was able to go today. She denies any other concerns with Brandy [name] or Brandy [name] at this time. Pt has been having difficulty sleeping throughout the night due to increased stress and anxiety. She has recently seen a lot of death in her family and is worried about her health since her diagnosis. She  has been taking Elavil around 7 pm but is often unable to go to sleep until 3 am. Pt has chronic SOB when going up the stairs and chronic back pain.   Of note since the patient's last visit, pt has had CT Angio Chest ([id number]) completed on 08/04/2019 with results revealing "1. No lobar or central pulmonary embolus detected. Exam is limited secondary to respiratory motion. 2. Mild ground-glass attenuation may represent mild pneumonitis or areas of air trapping. 3. Signs of atrial septal closure. 4. Decrease in size and number  of bilateral pulmonary nodules, marked response noted on today's exam with the only nodule remaining near a cm in the right upper lobe and the smaller nodules that were present on the previous examination throughout the chest no longer measurable though the lower lobes are limited by respiratory motion. 5. Decreased size of right breast mass. 6. Probable hepatic steatosis."  Lab results today (08/20/19) of CBC w/diff and CMP is as follows: all values are WNL except for RBC at 3.49, Hgb at 11.8, HCT at 35.7, MCV at 102.3, Glucose at 120, Creatinine at 1.35, AST at 60, ALT at 84, Alkaline Phosphatase at 164, GFR Est Af Am at 51. 08/20/2019 Magnesium at 1.3 08/20/2019 CA 15.3 is in progress 08/20/2019 CA 27.29 is in progress  On review of systems, pt reports SOB, back pain, productive cough, constipation, stress, anxiety and denies fevers and any other symptoms.   MEDICAL HISTORY:  Past Medical History:  Diagnosis Date  . Arthritis   . ASD (atrial septal defect)    s/p closure with Amplatzer device 10/05/04 (Dr. Myriam Jacobson, [facility]) 10/05/04  . Cataract   . GERD (gastroesophageal reflux disease)   . Headache   . Heart murmur    no longer heard  . Legally blind in right eye, as defined in [place]   . Lumbar herniated disc   . Sciatica   Uterine Fibroids  SURGICAL HISTORY: Past Surgical History:  Procedure Laterality Date  . ABLATION    . BREAST SURGERY Bilateral 2011   Breast Reduction Surgery  . BUNIONECTOMY    . CARDIAC CATHETERIZATION     10/05/04 [facility]): LM < 25%, otherwise normal coronaries. No pulmonary HTN, Mildly enlarged RV. Secundum ASD s/p closure.  Marland Kitchen CARDIAC SURGERY    . CATARACT EXTRACTION    . CLEFT PALATE REPAIR     s/p cleft lip and palate repair  . LUMBAR LAMINECTOMY/DECOMPRESSION MICRODISCECTOMY Left 05/23/2016   Procedure: LEFT L4-L5 LATERAL RECESS DECOMPRESSION WITH CENTRAL AND RIGHT DECOMPRESSION VIA LEFT SIDE;  Surgeon: [name], MD;  Location: [place];   Service: Orthopedics;  Laterality: Left;  . LUMBAR LAMINECTOMY/DECOMPRESSION MICRODISCECTOMY Left 05/23/2016   Procedure: LUMBAR LAMINECTOMY/DECOMPRESSION MICRODISCECTOMY Lumbar five - Sacral One 1 LEVEL;  Surgeon: [name], MD;  Location: [place];  Service: Orthopedics;  Laterality: Left;  . SHOULDER INJECTION Left 05/23/2016   Procedure: SHOULDER INJECTION;  Surgeon: [name], MD;  Location: [place];  Service: Orthopedics;  Laterality: Left;  band-aid per pa-c  . TRANSTHORACIC ECHOCARDIOGRAM     12/15/05 [facility]): Mild LVH, EF > 46%, grade 1 diastolic dysfunction, Trivial MR/PR/TR.  Endometrial ablation 2003 Breast Reduction, bilateral 2011  SOCIAL HISTORY: Social History   Socioeconomic History  . Marital status: Married    Spouse name: Not on file  . Number of children: 3  . Years of education: Not on file  . Highest education level: Not on file  Occupational History  .  Not on file  Tobacco Use  . Smoking status: Current Every Day Smoker    Packs/day: 0.50    Types: Cigarettes  . Smokeless tobacco: Never Used  Substance and Sexual Activity  . Alcohol use: No  . Drug use: No  . Sexual activity: Not on file  Other Topics Concern  . Not on file  Social History Narrative  . Not on file   Social Determinants of Health   Financial Resource Strain:   . Difficulty of Paying Living Expenses:   Food Insecurity:   . Worried About Charity fundraiser in the Last Year:   . Arboriculturist in the Last Year:   Transportation Needs:   . Film/video editor (Medical):   Marland Kitchen Lack of Transportation (Non-Medical):   Physical Activity:   . Days of Exercise per Week:   . Minutes of Exercise per Session:   Stress:   . Feeling of Stress :   Social Connections:   . Frequency of Communication with Friends and Family:   . Frequency of Social Gatherings with Friends and Family:   . Attends Religious Services:   . Active Member of Clubs or Organizations:   . Attends Theatre manager Meetings:   Marland Kitchen Marital Status:   Intimate Partner Violence:   . Fear of Current or Ex-Partner:   . Emotionally Abused:   Marland Kitchen Physically Abused:   . Sexually Abused:     FAMILY HISTORY: Family History  Problem Relation Age of Onset  . Diabetes Mother   . High blood pressure Mother   . Cancer Father        Lung  . Colon cancer Neg Hx   . Colon polyps Neg Hx   . Esophageal cancer Neg Hx   . Rectal cancer Neg Hx   . Stomach cancer Neg Hx     ALLERGIES:  is allergic to zithromax [azithromycin] and psyllium.  MEDICATIONS:  Current Outpatient Medications  Medication Sig Dispense Refill  . albuterol (VENTOLIN HFA) 108 (90 Base) MCG/ACT inhaler Inhale 2 puffs into the lungs every 6 (six) hours as needed for wheezing or shortness of breath. 18 g 5  . ALPRAZolam (XANAX) 1 MG tablet Take 1 mg by mouth daily as needed for anxiety.     Marland Kitchen amitriptyline (ELAVIL) 75 MG tablet Take 75 mg by mouth at bedtime.   6  . benzonatate (TESSALON) 100 MG capsule Take 1 capsule (100 mg total) by mouth 3 (three) times daily as needed for cough. 60 capsule 0  . chlorpheniramine-HYDROcodone (TUSSIONEX) 10-8 MG/5ML SUER Take 5 mLs by mouth at bedtime as needed for cough. 140 mL 0  . cyclobenzaprine (FLEXERIL) 10 MG tablet Take 1 tablet (10 mg total) by mouth 2 (two) times daily as needed for muscle spasms. 14 tablet 0  . ergocalciferol (VITAMIN D2) 1.25 MG (50000 UT) capsule Take 1 capsule (50,000 Units total) by mouth once a week. 12 capsule 3  . fluticasone (FLOVENT HFA) 110 MCG/ACT inhaler Inhale 2 puffs into the lungs 2 (two) times daily. 1 Inhaler 5  . guaiFENesin (MUCINEX) 600 MG 12 hr tablet Take 1 tablet (600 mg total) by mouth 2 (two) times daily. 60 tablet 2  . guaiFENesin-dextromethorphan (ROBITUSSIN DM) 100-10 MG/5ML syrup Take 10 mLs by mouth every 4 (four) hours as needed for cough. 118 mL 0  . HYDROcodone-Acetaminophen 7.5-300 MG TABS Take 1 tablet by mouth 3 (three) times daily as  needed. 60 tablet 0  .  HYDROcodone-homatropine (HYCODAN) 5-1.5 MG/5ML syrup Take 5 mLs by mouth every 6 (six) hours as needed for cough. 120 mL 0  . letrozole (FEMARA) 2.5 MG tablet Take 1 tablet (2.5 mg total) by mouth daily. 30 tablet 5  . omeprazole (PRILOSEC) 10 MG capsule Take 10 mg by mouth daily as needed (heartburn).     . ondansetron (ZOFRAN) 8 MG tablet Take 1 tablet (8 mg total) by mouth every 8 (eight) hours as needed for nausea or vomiting. 30 tablet 3  . Brandy [name] 100 MG tablet TAKE 1 TABLET BY MOUTH 2 TIMES DAILY. SWALLOW TABLETS WHOLE. DO NOT CHEW, CRUSH, OR SPLIT TABLETS BEFORE SWALLOWING. 28 tablet 2   Current Facility-Administered Medications  Medication Dose Route Frequency Provider Last Rate Last Admin  . 0.9 %  sodium chloride infusion  500 mL Intravenous Continuous Nandigam, Venia Minks, MD        REVIEW OF SYSTEMS:   A 10+ POINT REVIEW OF SYSTEMS WAS OBTAINED including neurology, dermatology, psychiatry, cardiac, respiratory, lymph, extremities, GI, GU, Musculoskeletal, constitutional, breasts, reproductive, HEENT.  All pertinent positives are noted in the HPI.  All others are negative.    PHYSICAL EXAMINATION: ECOG FS:1 - Symptomatic but completely ambulatory  Vitals:   08/20/19 1442  BP: 140/81  Pulse: (!) 118  Resp: 18  Temp: 97.9 F (36.6 C)  SpO2: 100%   Wt Readings from Last 3 Encounters:  08/20/19 226 lb 14.4 oz (102.9 kg)  08/04/19 227 lb (103 kg)  06/26/19 227 lb 12.8 oz (103.3 kg)   Body mass index is 35.54 kg/m.    GENERAL:alert, in no acute distress and comfortable SKIN: no acute rashes, no significant lesions EYES: conjunctiva are pink and non-injected, sclera anicteric OROPHARYNX: MMM, no exudates, no oropharyngeal erythema or ulceration NECK: supple, no JVD LYMPH:  no palpable lymphadenopathy in the cervical, axillary or inguinal regions LUNGS: clear to auscultation b/l with normal respiratory effort HEART: regular rate & rhythm ABDOMEN:   normoactive bowel sounds , non tender, not distended. No palpable hepatosplenomegaly.  Extremity: no pedal edema PSYCH: alert & oriented x 3 with fluent speech NEURO: no focal motor/sensory deficits  LABORATORY DATA:  I have reviewed the data as listed  . CBC Latest Ref Rng & Units 08/20/2019 08/05/2019 08/04/2019  WBC 4.0 - 10.5 K/uL 5.0 3.4(L) 4.9  Hemoglobin 12.0 - 15.0 g/dL 11.8(L) 9.9(L) 11.7(L)  Hematocrit 36.0 - 46.0 % 35.7(L) 30.0(L) 36.7  Platelets 150 - 400 K/uL 291 244 273    . CMP Latest Ref Rng & Units 08/20/2019 08/05/2019 08/04/2019  Glucose 70 - 99 mg/dL 120(H) 136(H) 99  BUN 6 - 20 mg/dL _0 Creatinine 0.44 - 1.00 mg/dL 1.35(H) 1.20(H) 1.28(H)  Sodium 135 - 145 mmol/L 144 139 135  Potassium 3.5 - 5.1 mmol/L 4.1 3.8 4.0  Chloride 98 - 111 mmol/L 104 103 94(L)  CO2 22 - 32 mmol/L _1 Calcium 8.9 - 10.3 mg/dL 9.5 8.6(L) 9.0  Total Protein 6.5 - 8.1 g/dL 8.0 - 8.3(H)  Total Bilirubin 0.3 - 1.2 mg/dL 0.5 - 0.9  Alkaline Phos 38 - 126 U/L 164(H) - 171(H)  AST 15 - 41 U/L 60(H) - 72(H)  ALT 0 - 44 U/L 84(H) - 123(H)   Component     Latest Ref Rng & Units 03/08/2019 03/22/2019  FSH     mIU/mL 65.5   LH     mIU/mL 46.6   Estrogen     pg/mL 82  Progesterone     ng/mL <0.1   Estradiol     pg/mL  <5.0      RADIOGRAPHIC STUDIES: I have personally reviewed the radiological images as listed and agreed with the findings in the report. CT Angio Chest PE W and/or Wo Contrast  Result Date: 08/04/2019 CLINICAL DATA:  PE suspected EXAM: CT ANGIOGRAPHY CHEST WITH CONTRAST TECHNIQUE: Multidetector CT imaging of the chest was performed using the standard protocol during bolus administration of intravenous contrast. Multiplanar CT image reconstructions and MIPs were obtained to evaluate the vascular anatomy. CONTRAST:  68m OMNIPAQUE IOHEXOL 350 MG/ML SOLN COMPARISON:  02/28/2019 FINDINGS: Cardiovascular: Mild atheromatous plaque in the thoracic aorta. Signs of atrial  septal closure. Heart size remains stable. No pericardial effusion. Study is motion limited with respect to lower lobe branches due to respiratory motion. No lobar or central pulmonary embolus. Study is also limited secondary to bolus timing. Mediastinum/Nodes: Normal thoracic inlet. No signs of axillary lymphadenopathy. No mediastinal adenopathy. No signs of hilar lymphadenopathy. Esophagus is grossly normal. Lungs/Pleura: Signs of basilar atelectasis. Pulmonary nodule in the right upper lobe (image 22, series 6) this measures 9 x 9 mm on today's study, previously 15 mm in greatest axial dimension. Similar decrease in size of numerous pulmonary nodules that were seen on the prior examination with a centrally near complete resolution of left upper lobe nodules since the previous exam. Subtle scattered ground-glass attenuation is nonspecific, no consolidation. No signs of pleural effusion. Airways are patent. Upper Abdomen: Low-attenuation in hepatic parenchyma likely reflects steatosis. Lobular hepatic contours. No acute upper abdominal process. Musculoskeletal: Right breast mass is less discrete with signs of spiculation extending into breast fat. Area measures 3.0 x 2.1 cm, previously 4.0 x 3.2 cm. No signs of bone lesion or acute bone finding. Review of the MIP images confirms the above findings. IMPRESSION: 1. No lobar or central pulmonary embolus detected. Exam is limited secondary to respiratory motion. 2. Mild ground-glass attenuation may represent mild pneumonitis or areas of air trapping. 3. Signs of atrial septal closure. 4. Decrease in size and number of bilateral pulmonary nodules, marked response noted on today's exam with the only nodule remaining near a cm in the right upper lobe and the smaller nodules that were present on the previous examination throughout the chest no longer measurable though the lower lobes are limited by respiratory motion. 5. Decreased size of right breast mass. 6. Probable  hepatic steatosis. Aortic Atherosclerosis (ICD10-I70.0). Electronically Signed   By: GZetta BillsM.D.   On: 08/04/2019 17:34   DG Chest Portable 1 View  Result Date: 08/04/2019 CLINICAL DATA:  Shortness of breath, body aches EXAM: PORTABLE CHEST 1 VIEW COMPARISON:  06/06/2019 FINDINGS: The heart size and mediastinal contours are within normal limits. Both lungs are clear. The visualized skeletal structures are unremarkable. IMPRESSION: No active disease. Electronically Signed   By: HKathreen Devoid  On: 08/04/2019 13:17   DG SCarlena HurlSPEECH PATH  Result Date: 08/09/2019 Objective Swallowing Evaluation: Type of Study: MBS-Modified Barium Swallow Study  Patient Details Name: EShaquilla KehresMRN: [id number] of Birth: [id number]'s Date: 08/09/2019 Time: SLP Start Time (ACUTE ONLY): 1340 -SLP Stop Time (ACUTE ONLY): 1430 SLP Time Calculation (min) (ACUTE ONLY): 50 min Past Medical History: Past Medical History: Diagnosis Date . Arthritis  . ASD (atrial septal defect)   s/p closure with Amplatzer device [facility] . Cataract  . GERD (gastroesophageal reflux disease)  . Headache  .  Heart murmur   no longer heard . Legally blind in right eye, as defined in [place]  . Lumbar herniated disc  . Sciatica  Past Surgical History: Past Surgical History: Procedure Laterality Date . ABLATION   . BREAST SURGERY Bilateral 2011  Breast Reduction Surgery . BUNIONECTOMY   . CARDIAC CATHETERIZATION    10/05/04 [facility]): LM < 25%, otherwise normal coronaries. No pulmonary HTN, Mildly enlarged RV. Secundum ASD s/p closure. Marland Kitchen CARDIAC SURGERY   . CATARACT EXTRACTION   . CLEFT PALATE REPAIR    s/p cleft lip and palate repair . LUMBAR LAMINECTOMY/DECOMPRESSION MICRODISCECTOMY Left 05/23/2016  Procedure: LEFT L4-L5 LATERAL RECESS DECOMPRESSION WITH CENTRAL AND RIGHT DECOMPRESSION VIA LEFT SIDE;  Surgeon: [name], MD;  Location: [place];  Service: Orthopedics;  Laterality: Left; . LUMBAR  LAMINECTOMY/DECOMPRESSION MICRODISCECTOMY Left 05/23/2016  Procedure: LUMBAR LAMINECTOMY/DECOMPRESSION MICRODISCECTOMY Lumbar five - Sacral One 1 LEVEL;  Surgeon: [name], MD;  Location: [place];  Service: Orthopedics;  Laterality: Left; . SHOULDER INJECTION Left 05/23/2016  Procedure: SHOULDER INJECTION;  Surgeon: [name], MD;  Location: [place];  Service: Orthopedics;  Laterality: Left;  band-aid per pa-c . TRANSTHORACIC ECHOCARDIOGRAM    12/15/05 [facility]): Mild LVH, EF > 56%, grade 1 diastolic dysfunction, Trivial MR/PR/TR. HPI: 54 yo female adm to [facility] with pneumonitis.  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  Pt complained of dysphagia - coughing/choking on liquids recently - over the last 2 weeks to one month.  Pt's BSE in hospital revealed concerns for dysphagia.  MBS advised and set up as OP so pt could discharge home.  Pt reports taking her PPI daily BID since leaving hospital and states it has been helpful to decrease cough associated with po.  Pt does endorse h/o having "esophageal erosions" previously.   Pt continues to report congestion and dried secretions in her cleft region that she coughs and expectorates.  She also complains of halitosis regardless of time spent brushing her dentition.  Subjective: pt awake in chair Assessment / Plan / Recommendation CHL IP CLINICAL IMPRESSIONS 08/09/2019 Clinical Impression Pt presents with minimal oral variances in swallowing which may be self created to compensates for her cleft lip/palate.  No aspiration or penetration of any consistency tested was noted.  Pt with delay in oral transiting, piecemealing boluses *solids/puree* from anterior to posterior oral cavity to swallow requiring up to 3 repetitions to complete oral clearance.  Liquid swallow faciliates full clearance of anterior oral residuals.  Cues to  place solid/puree boluses to mid oral -tongue decreased delay in oral transiting thus compensating for anterior lingual delay.  Liquids prematurely spilled into pharynx with swallowing triggering at various times including at tongue base and pyriform sinus.  Pt takes small boluses and reports this to be her baseline her entire life.  She did sense pharyngeal retention after swallowing tablet but pharynx was clear.  Extension of chin upward with pill swallow precariously left airway open, although pt had airway closed before liquid/tablet reach larynx. Advised she take pills with pudding/puree if needed - start and follow with liquids to assure clearance.   Chin tuck posture tested with pt given her report of "choking" when drinking liquids at times.  Chin tuck did not result in aspiration or penetration either - tested to assure not negative responses.  Of note, pt did appear to have minimal  retrograde propulsion in esophagus without awareness.  And she did report sensation of residuals at pharynx x1 when pharynx was clear.  SLP provided pt with LPR (laryngopharyngeal reflux) screen with which she scored 30/45 - highly indicative of LPR.  Given her h/o known GERD, esophageal erosions, provided her with esophageal precautions.  All education completed using video loops and written instructions.  Thanks for this referral of this most kind patient.  Of note, pt coughed during and after MBS - no aspiration observed during MBS.   SLP Visit Diagnosis Dysphagia, oral phase (R13.11) Attention and concentration deficit following -- Frontal lobe and executive function deficit following -- Impact on safety and function Mild aspiration risk   CHL IP TREATMENT RECOMMENDATION 08/05/2019 Treatment Recommendations F/U MBS in --- days (Comment)   No flowsheet data found. CHL IP DIET RECOMMENDATION 08/09/2019 SLP Diet Recommendations Regular solids;Thin liquid Liquid Administration via Cup;Straw Medication Administration Whole meds with  liquid Compensations Slow rate;Small sips/bites Postural Changes Remain semi-upright after after feeds/meals (Comment);Seated upright at 90 degrees   CHL IP OTHER RECOMMENDATIONS 08/09/2019 Recommended Consults (No Data) Oral Care Recommendations -- Other Recommendations --   CHL IP FOLLOW UP RECOMMENDATIONS 08/05/2019 Follow up Recommendations (No Data)   No flowsheet data found.     CHL IP ORAL PHASE 08/09/2019 Oral Phase Impaired Oral - Pudding Teaspoon -- Oral - Pudding Cup -- Oral - Honey Teaspoon -- Oral - Honey Cup -- Oral - Nectar Teaspoon -- Oral - Nectar Cup Delayed oral transit Oral - Nectar Straw -- Oral - Thin Teaspoon -- Oral - Thin Cup WFL;Premature spillage Oral - Thin Straw WFL Oral - Puree Piecemeal swallowing;Delayed oral transit Oral - Mech Soft -- Oral - Regular Piecemeal swallowing;Delayed oral transit Oral - Multi-Consistency -- Oral - Pill WFL;Delayed oral transit Oral Phase - Comment pt with delay in oral transiting, piecemealing boluses *solids/puree* from anterior to posterior oral cavity to swallow requiring up to 3 repetitions to complete oral clearance, placement of  boluses to mid oral -tongue decreased delay in oral transiting thus compensation for anterior lingual delay, Liquids prematurely spilled into pharynx with swallowing triggering at various times including at tongue base and pyriform sinus  CHL IP PHARYNGEAL PHASE 08/09/2019 Pharyngeal Phase WFL;Impaired Pharyngeal- Pudding Teaspoon -- Pharyngeal -- Pharyngeal- Pudding Cup -- Pharyngeal -- Pharyngeal- Honey Teaspoon -- Pharyngeal -- Pharyngeal- Honey Cup -- Pharyngeal -- Pharyngeal- Nectar Teaspoon -- Pharyngeal -- Pharyngeal- Nectar Cup WFL Pharyngeal -- Pharyngeal- Nectar Straw -- Pharyngeal -- Pharyngeal- Thin Teaspoon -- Pharyngeal -- Pharyngeal- Thin Cup WFL Pharyngeal -- Pharyngeal- Thin Straw WFL Pharyngeal -- Pharyngeal- Puree WFL;Delayed swallow initiation-vallecula Pharyngeal -- Pharyngeal- Mechanical Soft -- Pharyngeal --  Pharyngeal- Regular WFL Pharyngeal -- Pharyngeal- Multi-consistency -- Pharyngeal -- Pharyngeal- Pill WFL Pharyngeal -- Pharyngeal Comment --  CHL IP CERVICAL ESOPHAGEAL PHASE 08/09/2019 Cervical Esophageal Phase WFL Appearance of backflow, retrograde propulsion without pt awareness, ? Consistent with dysmotility.  Tablet was not observed in esophagus after it was swallows Pudding Teaspoon -- Pudding Cup -- Honey Teaspoon -- Honey Cup -- Nectar Teaspoon -- Nectar Cup -- Nectar Straw -- Thin Teaspoon -- Thin Cup -- Thin Straw -- Puree -- Mechanical Soft -- Regular -- Multi-consistency -- Pill -- Cervical Esophageal Comment -- Kathleen Lime, MS [name] SLP Acute Rehab Services Office [phone] Macario Golds 08/09/2019, 3:11 PM               ASSESSMENT & PLAN:   Brandy [name] is a 54 y.o. female with:  1. Metastatic breast cancer ER+/PRneg/Her2 neg  02/28/2019 neck CT with results revealing "negative for mass or adenopathy in the neck."  03/04/2019 head MRI with results revealing "Negative for metastatic disease.  No acute abnormality in the brain."  2. Likely Pulmonary metastases  02/18/2019 chest and abdomen with results revealing "5cm spiculated soft tissue mass in inferior right breast, highly suspicious for primary breast carcinoma. No acute findings or metastatic disease within the abdomen or pelvis. Multiple small pulmonary nodules in both lung bases, consistent with pulmonary metastases. 4.5 cm uterine fibroid."  02/28/2019 C/A/P CT with results revealing "Irregular solid 5.0 cm right breast mass, suspicious for primary right breast malignancy. Innumerable solid pulmonary nodules scattered throughout both lungs, compatible with pulmonary metastases. No evidence of metastatic disease in the abdomen, pelvis or skeleton. Mildly enlarged and probably myomatous uterus. Simple 1.4 cm left adnexal cyst requires no follow-up. This recommendation follows ACR consensus guidelines: White Paper of the ACR  Incidental Findings Committee II on Adnexal Findings. J Am Coll Radiol [phone]. Aortic Atherosclerosis (ICD10-I70.0)."  NUCLEAR MEDICINE WHOLE BODY BONE SCAN completed on 03/19/2019 with results revealing "1. No scintigraphic evidence skeletal metastasis. 2. Degenerative bone disease in the posterior elements of the upper and mid lumbar spine."   04/09/2019 Bone Density ([id number]) which revealed "The BMD measured at Femur Neck Right is 1.153 g/cm2 with a T-score of 0.8. This patient is considered normal according to [facility]) criteria. Lumbar spine was not utilized due to advanced degenerative changes. The scan quality is good. Femur Neck Right 04/09/2019 54.7 Normal 0.8 1.153 g/cm2. Left Forearm Radius 33% 04/09/2019 54.7 Normal 0.8 0.949 g/cm2."  3. Abnormal Liver function test -- previous? Fatty liver 07/03/2019 [place] Abd ([id number]) revealed "1. No acute findings. Normal gallbladder. No bile duct dilation. 2. Significant increased liver parenchymal echogenicity consistent with extensive hepatic steatosis."  PLAN: -Discussed pt labwork today, 08/20/19; Hgb has improved, WBC and PLT are normal, liver enzymes have improved, other blood chemistries are steady -Discussed 08/20/2019 Magnesium is very low at 1.3 -Discussed 08/20/2019 CA 15.3 is in progress -Discussed 08/20/2019 CA 27.29 is in progress -Discussed 08/04/2019 CT Angio Chest ([id number]) which revealed "1. No lobar or central pulmonary embolus detected. Exam is limited secondary to respiratory motion. 2. Mild ground-glass attenuation may represent mild pneumonitis or areas of air trapping. 3. Signs of atrial septal closure. 4. Decrease in size and number of bilateral pulmonary nodules, marked response noted on today's exam with the only nodule remaining near a cm in the right upper lobe and the smaller nodules that were present on the previous examination throughout the chest no longer measurable though the  lower lobes are limited by respiratory motion. 5. Decreased size of right breast mass. 6. Probable hepatic steatosis." -Recommend pt eat dry fruit to improve Magnesium intake -Recommend pt use sterile-saline nebulizer or steam at home to open airways -Recommend pt use a hot-air humidifier at night  -Advised pt that hydrocodone cough syrup can contribute to constipation -Advised pt to use Miralax or Senna as needed for constipation -Continue Verzino and Letrozole as prescribed -Continue Calcium and Vitamin D replacement prn -Continue Imodium prn -Rx Magnesium supplement - continue tomonitor LFs -Will see back in 6 weeks with labs   FOLLOW UP: RTC with Dr Irene Limbo with labs in 6 weeks   . The total time spent in the appointment was 30 minutes and more than 50% was on counseling and direct patient cares.    All of the patient's questions  were answered with apparent satisfaction. The patient knows to call the clinic with any problems, questions or concerns.    Sullivan Lone MD [place] AAHIVMS [facility] Hematology/Oncology Physician [facility]  (Office):       [phone] (Work cell):  [phone] (Fax):           [phone]  08/20/2019 4:25 PM  I, Yevette Edwards, am acting as a scribe for Dr. Sullivan Lone.   .I have reviewed the above documentation for accuracy and completeness, and I agree with the above. Brunetta Genera MD

## 2019-08-21 LAB — CANCER ANTIGEN 27.29: CA 27.29: 59 U/mL — ABNORMAL HIGH (ref 0.0–38.6)

## 2019-08-21 LAB — CANCER ANTIGEN 15-3: CA 15-3: 37.9 U/mL — ABNORMAL HIGH (ref 0.0–25.0)

## 2019-08-21 MED ORDER — MAGNESIUM 500 MG PO TABS: 500.0000 mg | ORAL_TABLET | Freq: Two times a day (BID) | ORAL | 1 refills | Status: DC

## 2019-08-22 ENCOUNTER — Telehealth: Payer: Self-pay | Admitting: Hematology

## 2019-08-22 NOTE — Telephone Encounter (Signed)
Scheduled per 03/16 los, patient's voicemail is full.

## 2019-09-04 ENCOUNTER — Emergency Department (HOSPITAL_COMMUNITY): Payer: BC Managed Care – PPO

## 2019-09-04 ENCOUNTER — Encounter (HOSPITAL_COMMUNITY): Payer: Self-pay

## 2019-09-04 ENCOUNTER — Inpatient Hospital Stay (HOSPITAL_COMMUNITY)
Admission: EM | Admit: 2019-09-04 | Discharge: 2019-09-08 | DRG: 872 | Disposition: A | Discharge status: Home / Self Care | Payer: BC Managed Care – PPO | Attending: Internal Medicine | Admitting: Internal Medicine

## 2019-09-04 ENCOUNTER — Other Ambulatory Visit: Payer: Self-pay

## 2019-09-04 DIAGNOSIS — Z6836 Body mass index (BMI) 36.0-36.9, adult: Secondary | ICD-10-CM

## 2019-09-04 DIAGNOSIS — R112 Nausea with vomiting, unspecified: Secondary | ICD-10-CM

## 2019-09-04 DIAGNOSIS — K760 Fatty (change of) liver, not elsewhere classified: Secondary | ICD-10-CM | POA: Diagnosis present

## 2019-09-04 DIAGNOSIS — R197 Diarrhea, unspecified: Secondary | ICD-10-CM

## 2019-09-04 DIAGNOSIS — R1013 Epigastric pain: Secondary | ICD-10-CM

## 2019-09-04 DIAGNOSIS — E872 Acidosis, unspecified: Secondary | ICD-10-CM | POA: Diagnosis present

## 2019-09-04 DIAGNOSIS — R509 Fever, unspecified: Secondary | ICD-10-CM

## 2019-09-04 DIAGNOSIS — F32A Depression, unspecified: Secondary | ICD-10-CM | POA: Diagnosis present

## 2019-09-04 DIAGNOSIS — F329 Major depressive disorder, single episode, unspecified: Secondary | ICD-10-CM | POA: Diagnosis present

## 2019-09-04 DIAGNOSIS — Z87730 Personal history of (corrected) cleft lip and palate: Secondary | ICD-10-CM

## 2019-09-04 DIAGNOSIS — R7401 Elevation of levels of liver transaminase levels: Secondary | ICD-10-CM | POA: Diagnosis not present

## 2019-09-04 DIAGNOSIS — E669 Obesity, unspecified: Secondary | ICD-10-CM | POA: Diagnosis present

## 2019-09-04 DIAGNOSIS — G4700 Insomnia, unspecified: Secondary | ICD-10-CM | POA: Diagnosis present

## 2019-09-04 DIAGNOSIS — Z833 Family history of diabetes mellitus: Secondary | ICD-10-CM | POA: Diagnosis not present

## 2019-09-04 DIAGNOSIS — N183 Chronic kidney disease, stage 3 unspecified: Secondary | ICD-10-CM | POA: Diagnosis present

## 2019-09-04 DIAGNOSIS — Z79899 Other long term (current) drug therapy: Secondary | ICD-10-CM

## 2019-09-04 DIAGNOSIS — R1310 Dysphagia, unspecified: Secondary | ICD-10-CM | POA: Diagnosis present

## 2019-09-04 DIAGNOSIS — E785 Hyperlipidemia, unspecified: Secondary | ICD-10-CM | POA: Diagnosis present

## 2019-09-04 DIAGNOSIS — C50919 Malignant neoplasm of unspecified site of unspecified female breast: Secondary | ICD-10-CM | POA: Diagnosis present

## 2019-09-04 DIAGNOSIS — I10 Essential (primary) hypertension: Secondary | ICD-10-CM | POA: Diagnosis not present

## 2019-09-04 DIAGNOSIS — Z20822 Contact with and (suspected) exposure to covid-19: Secondary | ICD-10-CM | POA: Diagnosis present

## 2019-09-04 DIAGNOSIS — Z8774 Personal history of (corrected) congenital malformations of heart and circulatory system: Secondary | ICD-10-CM

## 2019-09-04 DIAGNOSIS — F419 Anxiety disorder, unspecified: Secondary | ICD-10-CM | POA: Diagnosis present

## 2019-09-04 DIAGNOSIS — N1831 Chronic kidney disease, stage 3a: Secondary | ICD-10-CM | POA: Diagnosis not present

## 2019-09-04 DIAGNOSIS — E876 Hypokalemia: Secondary | ICD-10-CM | POA: Diagnosis not present

## 2019-09-04 DIAGNOSIS — K219 Gastro-esophageal reflux disease without esophagitis: Secondary | ICD-10-CM | POA: Diagnosis present

## 2019-09-04 DIAGNOSIS — I130 Hypertensive heart and chronic kidney disease with heart failure and stage 1 through stage 4 chronic kidney disease, or unspecified chronic kidney disease: Secondary | ICD-10-CM | POA: Diagnosis present

## 2019-09-04 DIAGNOSIS — A419 Sepsis, unspecified organism: Principal | ICD-10-CM | POA: Diagnosis present

## 2019-09-04 DIAGNOSIS — H548 Legal blindness, as defined in USA: Secondary | ICD-10-CM | POA: Diagnosis present

## 2019-09-04 DIAGNOSIS — I5033 Acute on chronic diastolic (congestive) heart failure: Secondary | ICD-10-CM | POA: Diagnosis not present

## 2019-09-04 HISTORY — DX: Fatty (change of) liver, not elsewhere classified: K76.0

## 2019-09-04 LAB — RAPID URINE DRUG SCREEN, HOSP PERFORMED
Amphetamines: NOT DETECTED
Barbiturates: NOT DETECTED
Benzodiazepines: POSITIVE — AB
Cocaine: NOT DETECTED
Opiates: POSITIVE — AB
Tetrahydrocannabinol: NOT DETECTED

## 2019-09-04 LAB — COMPREHENSIVE METABOLIC PANEL
ALT: 108 U/L — ABNORMAL HIGH (ref 0–44)
AST: 120 U/L — ABNORMAL HIGH (ref 15–41)
Albumin: 3.7 g/dL (ref 3.5–5.0)
Alkaline Phosphatase: 146 U/L — ABNORMAL HIGH (ref 38–126)
Anion gap: 11 (ref 5–15)
BUN: 15 mg/dL (ref 6–20)
CO2: 25 mmol/L (ref 22–32)
Calcium: 9 mg/dL (ref 8.9–10.3)
Chloride: 104 mmol/L (ref 98–111)
Creatinine, Ser: 1.27 mg/dL — ABNORMAL HIGH (ref 0.44–1.00)
GFR calc Af Amer: 55 mL/min — ABNORMAL LOW (ref >60)
GFR calc non Af Amer: 47 mL/min — ABNORMAL LOW (ref >60)
Glucose, Bld: 128 mg/dL — ABNORMAL HIGH (ref 70–99)
Potassium: 4 mmol/L (ref 3.5–5.1)
Sodium: 140 mmol/L (ref 135–145)
Total Bilirubin: 0.9 mg/dL (ref 0.3–1.2)
Total Protein: 7.9 g/dL (ref 6.5–8.1)

## 2019-09-04 LAB — BRAIN NATRIURETIC PEPTIDE: B Natriuretic Peptide: 39.8 pg/mL (ref 0.0–100.0)

## 2019-09-04 LAB — CBC WITH DIFFERENTIAL/PLATELET
Abs Immature Granulocytes: 0.02 10*3/uL (ref 0.00–0.07)
Basophils Absolute: 0 10*3/uL (ref 0.0–0.1)
Basophils Relative: 1 %
Eosinophils Absolute: 0 10*3/uL (ref 0.0–0.5)
Eosinophils Relative: 1 %
HCT: 38.4 % (ref 36.0–46.0)
Hemoglobin: 12.3 g/dL (ref 12.0–15.0)
Immature Granulocytes: 0 %
Lymphocytes Relative: 18 %
Lymphs Abs: 1 10*3/uL (ref 0.7–4.0)
MCH: 33.3 pg (ref 26.0–34.0)
MCHC: 32 g/dL (ref 30.0–36.0)
MCV: 104.1 fL — ABNORMAL HIGH (ref 80.0–100.0)
Monocytes Absolute: 0.2 10*3/uL (ref 0.1–1.0)
Monocytes Relative: 4 %
Neutro Abs: 4.2 10*3/uL (ref 1.7–7.7)
Neutrophils Relative %: 76 %
Platelets: 280 10*3/uL (ref 150–400)
RBC: 3.69 MIL/uL — ABNORMAL LOW (ref 3.87–5.11)
RDW: 14.2 % (ref 11.5–15.5)
WBC: 5.4 10*3/uL (ref 4.0–10.5)
nRBC: 0 % (ref 0.0–0.2)

## 2019-09-04 LAB — LACTIC ACID, PLASMA
Lactic Acid, Venous: 2 mmol/L (ref 0.5–1.9)
Lactic Acid, Venous: 1.7 mmol/L (ref 0.5–1.9)
Lactic Acid, Venous: 1.7 mmol/L (ref 0.5–1.9)

## 2019-09-04 LAB — URINALYSIS, ROUTINE W REFLEX MICROSCOPIC
Bilirubin Urine: NEGATIVE
Glucose, UA: NEGATIVE mg/dL
Ketones, ur: NEGATIVE mg/dL
Nitrite: NEGATIVE
Protein, ur: NEGATIVE mg/dL
Specific Gravity, Urine: 1.041 — ABNORMAL HIGH (ref 1.005–1.030)
pH: 5 (ref 5.0–8.0)

## 2019-09-04 LAB — LIPASE, BLOOD: Lipase: 38 U/L (ref 11–51)

## 2019-09-04 LAB — POC SARS CORONAVIRUS 2 AG -  ED: SARS Coronavirus 2 Ag: NEGATIVE

## 2019-09-04 LAB — SARS CORONAVIRUS 2 (TAT 6-24 HRS): SARS Coronavirus 2: NEGATIVE

## 2019-09-04 LAB — TROPONIN I (HIGH SENSITIVITY): Troponin I (High Sensitivity): 7 ng/L (ref <18)

## 2019-09-04 MED ORDER — HYDROMORPHONE HCL 1 MG/ML IJ SOLN
0.5000 mg | Freq: Once | INTRAMUSCULAR | Status: AC
  Administered 2019-09-04: 0.5 mg via INTRAVENOUS
  Filled 2019-09-04: qty 1

## 2019-09-04 MED ORDER — PAROXETINE HCL 10 MG PO TABS
10.0000 mg | ORAL_TABLET | Freq: Every day | ORAL | Status: DC
  Administered 2019-09-05 – 2019-09-08 (×4): 10 mg via ORAL
  Filled 2019-09-04 (×4): qty 1

## 2019-09-04 MED ORDER — MORPHINE SULFATE (PF) 2 MG/ML IV SOLN
2.0000 mg | Freq: Once | INTRAVENOUS | Status: AC
  Administered 2019-09-04: 2 mg via INTRAVENOUS
  Filled 2019-09-04: qty 1

## 2019-09-04 MED ORDER — IOHEXOL 300 MG/ML  SOLN
100.0000 mL | Freq: Once | INTRAMUSCULAR | Status: AC | PRN
  Administered 2019-09-04: 100 mL via INTRAVENOUS

## 2019-09-04 MED ORDER — ALUM & MAG HYDROXIDE-SIMETH 200-200-20 MG/5ML PO SUSP
30.0000 mL | Freq: Four times a day (QID) | ORAL | Status: DC | PRN
  Administered 2019-09-04 – 2019-09-05 (×3): 30 mL via ORAL
  Filled 2019-09-04 (×3): qty 30

## 2019-09-04 MED ORDER — ENOXAPARIN SODIUM 40 MG/0.4ML ~~LOC~~ SOLN
40.0000 mg | SUBCUTANEOUS | Status: DC
  Administered 2019-09-04 – 2019-09-07 (×4): 40 mg via SUBCUTANEOUS
  Filled 2019-09-04 (×4): qty 0.4

## 2019-09-04 MED ORDER — METOPROLOL TARTRATE 5 MG/5ML IV SOLN
5.0000 mg | Freq: Four times a day (QID) | INTRAVENOUS | Status: DC
  Administered 2019-09-04 – 2019-09-07 (×11): 5 mg via INTRAVENOUS
  Filled 2019-09-04 (×11): qty 5

## 2019-09-04 MED ORDER — MORPHINE SULFATE (PF) 2 MG/ML IV SOLN
2.0000 mg | INTRAVENOUS | Status: DC | PRN
  Administered 2019-09-05: 2 mg via INTRAVENOUS
  Filled 2019-09-04: qty 1

## 2019-09-04 MED ORDER — PIPERACILLIN-TAZOBACTAM 3.375 G IVPB: 3.3750 g | Freq: Three times a day (TID) | INTRAVENOUS | Status: DC

## 2019-09-04 MED ORDER — ACETAMINOPHEN 500 MG PO TABS
1000.0000 mg | ORAL_TABLET | Freq: Once | ORAL | Status: AC
  Administered 2019-09-04: 1000 mg via ORAL
  Filled 2019-09-04: qty 2

## 2019-09-04 MED ORDER — METRONIDAZOLE IN NACL 5-0.79 MG/ML-% IV SOLN
500.0000 mg | Freq: Three times a day (TID) | INTRAVENOUS | Status: DC
  Administered 2019-09-05 (×2): 500 mg via INTRAVENOUS
  Filled 2019-09-04 (×2): qty 100

## 2019-09-04 MED ORDER — SODIUM CHLORIDE 0.9 % IV BOLUS
1000.0000 mL | Freq: Once | INTRAVENOUS | Status: AC
  Administered 2019-09-04: 1000 mL via INTRAVENOUS

## 2019-09-04 MED ORDER — SODIUM CHLORIDE 0.9% FLUSH
3.0000 mL | Freq: Two times a day (BID) | INTRAVENOUS | Status: DC
  Administered 2019-09-05 – 2019-09-08 (×5): 3 mL via INTRAVENOUS

## 2019-09-04 MED ORDER — ONDANSETRON HCL 4 MG PO TABS: 4.0000 mg | ORAL_TABLET | Freq: Four times a day (QID) | ORAL | Status: DC | PRN

## 2019-09-04 MED ORDER — ABEMACICLIB 100 MG PO TABS: 100.0000 mg | ORAL_TABLET | Freq: Two times a day (BID) | ORAL | Status: DC

## 2019-09-04 MED ORDER — PIPERACILLIN-TAZOBACTAM 3.375 G IVPB 30 MIN
3.3750 g | Freq: Once | INTRAVENOUS | Status: AC
  Administered 2019-09-04: 3.375 g via INTRAVENOUS
  Filled 2019-09-04: qty 50

## 2019-09-04 MED ORDER — METOCLOPRAMIDE HCL 5 MG/ML IJ SOLN
10.0000 mg | Freq: Once | INTRAMUSCULAR | Status: AC
  Administered 2019-09-04: 10 mg via INTRAVENOUS
  Filled 2019-09-04: qty 2

## 2019-09-04 MED ORDER — SODIUM CHLORIDE (PF) 0.9 % IJ SOLN
INTRAMUSCULAR | Status: AC
  Filled 2019-09-04: qty 50

## 2019-09-04 MED ORDER — SODIUM CHLORIDE 0.9 % IV SOLN: INTRAVENOUS | Status: DC

## 2019-09-04 MED ORDER — LETROZOLE 2.5 MG PO TABS
2.5000 mg | ORAL_TABLET | Freq: Every day | ORAL | Status: DC
  Administered 2019-09-05 – 2019-09-08 (×4): 2.5 mg via ORAL
  Filled 2019-09-04 (×4): qty 1

## 2019-09-04 MED ORDER — PANTOPRAZOLE SODIUM 40 MG PO TBEC
40.0000 mg | DELAYED_RELEASE_TABLET | Freq: Every day | ORAL | Status: DC
  Administered 2019-09-04 – 2019-09-05 (×2): 40 mg via ORAL
  Filled 2019-09-04 (×2): qty 1

## 2019-09-04 MED ORDER — ONDANSETRON HCL 4 MG/2ML IJ SOLN
4.0000 mg | Freq: Once | INTRAMUSCULAR | Status: AC
  Administered 2019-09-04: 4 mg via INTRAVENOUS
  Filled 2019-09-04: qty 2

## 2019-09-04 MED ORDER — SODIUM CHLORIDE 0.9 % IV SOLN
2.0000 g | INTRAVENOUS | Status: DC
  Administered 2019-09-04: 2 g via INTRAVENOUS
  Filled 2019-09-04: qty 2
  Filled 2019-09-04: qty 20

## 2019-09-04 MED ORDER — BACITRACIN-POLYMYXIN B 500-10000 UNIT/GM OP OINT
TOPICAL_OINTMENT | Freq: Every evening | OPHTHALMIC | Status: DC | PRN
  Administered 2019-09-05: 1 via OPHTHALMIC
  Filled 2019-09-04: qty 3.5

## 2019-09-04 MED ORDER — HYDROCODONE-ACETAMINOPHEN 5-325 MG PO TABS
1.0000 | ORAL_TABLET | Freq: Four times a day (QID) | ORAL | Status: DC | PRN
  Administered 2019-09-04 – 2019-09-05 (×2): 1 via ORAL
  Administered 2019-09-05: 2 via ORAL
  Filled 2019-09-04: qty 1
  Filled 2019-09-04: qty 2
  Filled 2019-09-04: qty 1

## 2019-09-04 MED ORDER — ONDANSETRON HCL 4 MG/2ML IJ SOLN
4.0000 mg | Freq: Four times a day (QID) | INTRAMUSCULAR | Status: DC | PRN
  Administered 2019-09-04 – 2019-09-05 (×3): 4 mg via INTRAVENOUS
  Filled 2019-09-04 (×4): qty 2

## 2019-09-04 NOTE — ED Provider Notes (Signed)
54 yo F received in signout from Dr. Tomi Bamberger.  Briefly the patient has a history of metastatic breast cancer.  On oral therapy.  Coming in with diarrhea.  Started last night.  Described as severe.  On my exam the patient looks well.  She is tachycardic into the 120s despite 2 L of IV fluids.  CT scan pending.  CT scan has returned without obvious pathology.  Patient's fever and tachycardia and profound diarrhea makes me wonder if she has infectious diarrhea.  We will send off stool studies.  With her persistent tachycardia will discuss with the hospitalist for possible admission.   Deno Etienne, DO 09/04/19 1143

## 2019-09-04 NOTE — ED Triage Notes (Signed)
Patient in from home with the complaints of upper abdominal pain & NVD since 2000, history of hiatal hernia and gerd, same episode of symptoms 2 weeks ago, takes meds for chemo.  EMS vitals 98%RA 129cbg 130hr   239ml fluid per

## 2019-09-04 NOTE — ED Notes (Signed)
Admitting provider at bedside.

## 2019-09-04 NOTE — ED Notes (Signed)
Pt assisted to [facility].  Pt became tachypneic, HR elevated to 133.  Pt assisted to perform peri care, get into new brief, back into bed.  Will continue to monitor.

## 2019-09-04 NOTE — Progress Notes (Signed)
Pharmacy Brief Note  Patient admitted for sepsis due to presumed abdominal process. Verzenio held due to active infection.   Ulice Dash, PharmD, BCPS

## 2019-09-04 NOTE — ED Notes (Signed)
Pt with c/o severe heartburn.  Pt tachypneic, rates pain 9/10.  Pt reports that this feels like her 'usual indigestion, just really bad." Admitting provider paged.

## 2019-09-04 NOTE — H&P (Addendum)
History and Physical  Brandy [name] [id number] DOB: 10/01/1965 DOA: 09/04/2019  Referring physician: Deno Etienne, ED Physician  PCP: [facility]  Outpatient Specialists: Sullivan Lone, Oncology Patient coming from: Home & is able to ambulate without assistance  Chief Complaint: N/V/D   HPI: Brandy [name] is a 54 y.o. female with medical history significant for obesity and breast cancer and was in her usual state of health until last night at approximately 7 PM when she started having severe sudden onset of diarrhea and nausea/vomiting.  Symptoms were relentless and continued through the night.  She could not take it anymore so she came into the emergency room in the early morning hours of 3/31.  Patient also reported fevers as high as 103, although in the emergency room, the highest documented temperature was 100.1  ED Course: In the emergency room, patient noted to have a normal white blood cell count and CT scan of abdomen pelvis noted no signs of colitis or obstruction.  Patient was markedly tachycardic as well as noted to have malignant elevated blood pressure with systolic as high as the [id number] to 190s.  Patient given some IV fluids, medication for pain and nausea but her symptoms still persisted.  Stool cultures have been ordered and are pending.  Hospitalist were called for further evaluation.  Review of Systems: Patient seen in the Emergency room. Pt complains of abdominal distention, nausea, vomiting and diarrhea.  She is some chronic lower back pain.  Patient tells me that she has had problems with right eye socket drainage for the last 3 weeks as well as issues of intermittent oropharyngeal dysphagia  Pt denies any headaches, vision changes, chest pain, palpitations, shortness of breath, wheeze, cough, abdominal pain, hematuria, dysuria, constipation, focal extremity numbness weakness or pain.  Review of systems are otherwise negative   Past Medical History:    Diagnosis Date  . Arthritis   . ASD (atrial septal defect)    s/p closure with Amplatzer device 10/05/04 (Dr. Myriam Jacobson, [facility]) 10/05/04  . Cataract   . GERD (gastroesophageal reflux disease)   . Headache   . Heart murmur    no longer heard  . Legally blind in right eye, as defined in [place]   . Lumbar herniated disc   . Sciatica    Past Surgical History:  Procedure Laterality Date  . ABLATION    . BREAST SURGERY Bilateral 2011   Breast Reduction Surgery  . BUNIONECTOMY    . CARDIAC CATHETERIZATION     10/05/04 [facility]): LM < 25%, otherwise normal coronaries. No pulmonary HTN, Mildly enlarged RV. Secundum ASD s/p closure.  Marland Kitchen CARDIAC SURGERY    . CATARACT EXTRACTION    . CLEFT PALATE REPAIR     s/p cleft lip and palate repair  . LUMBAR LAMINECTOMY/DECOMPRESSION MICRODISCECTOMY Left 05/23/2016   Procedure: LEFT L4-L5 LATERAL RECESS DECOMPRESSION WITH CENTRAL AND RIGHT DECOMPRESSION VIA LEFT SIDE;  Surgeon: [name], MD;  Location: [place];  Service: Orthopedics;  Laterality: Left;  . LUMBAR LAMINECTOMY/DECOMPRESSION MICRODISCECTOMY Left 05/23/2016   Procedure: LUMBAR LAMINECTOMY/DECOMPRESSION MICRODISCECTOMY Lumbar five - Sacral One 1 LEVEL;  Surgeon: [name], MD;  Location: [place];  Service: Orthopedics;  Laterality: Left;  . SHOULDER INJECTION Left 05/23/2016   Procedure: SHOULDER INJECTION;  Surgeon: [name], MD;  Location: [place];  Service: Orthopedics;  Laterality: Left;  band-aid per pa-c  . TRANSTHORACIC ECHOCARDIOGRAM     12/15/05 [facility]): Mild LVH, EF > 55%,  grade 1 diastolic dysfunction, Trivial MR/PR/TR.    Social History:  Quit smoking after diagnosed with breast cancer.  No alcohol or drug use.  Lives at home with husband.  Ambulates with assistance.   Allergies  Allergen Reactions  . Zithromax [Azithromycin] Shortness Of Breath and Itching    TOTAL BODY ITCHING [EVEN SOLES OF FEET] WHEEZING   . Psyllium Nausea And Vomiting    Family History   Problem Relation Age of Onset  . Diabetes Mother   . High blood pressure Mother   . Cancer Father        Lung  . Colon cancer Neg Hx   . Colon polyps Neg Hx   . Esophageal cancer Neg Hx   . Rectal cancer Neg Hx   . Stomach cancer Neg Hx      Prior to Admission medications   Medication Sig Start Date End Date Taking? Authorizing Provider  albuterol (VENTOLIN HFA) 108 (90 Base) MCG/ACT inhaler Inhale 2 puffs into the lungs every 6 (six) hours as needed for wheezing or shortness of breath. 06/06/19  Yes Tanner, Lyndon Code., PA-C  ALPRAZolam [name]) 1 MG tablet Take 1 mg by mouth daily as needed for anxiety.  02/14/19  Yes [provider]  amitriptyline (ELAVIL) 75 MG tablet Take 75 mg by mouth at bedtime.  01/27/15  Yes [provider]  atorvastatin (LIPITOR) 10 MG tablet Take 10 mg by mouth daily.   Yes [provider]  benzonatate (TESSALON) 100 MG capsule Take 1 capsule (100 mg total) by mouth 3 (three) times daily as needed for cough. 04/22/19  Yes Brunetta Genera, MD  ergocalciferol (VITAMIN D2) 1.25 MG (50000 UT) capsule Take 1 capsule (50,000 Units total) by mouth once a week. 03/08/19  Yes Brunetta Genera, MD  fluticasone (FLOVENT HFA) 110 MCG/ACT inhaler Inhale 2 puffs into the lungs 2 (two) times daily. 06/06/19  Yes Tanner, Lyndon Code., PA-C  guaiFENesin (MUCINEX) 600 MG 12 hr tablet Take 1 tablet (600 mg total) by mouth 2 (two) times daily. 08/05/19 08/04/20 Yes Georgette Shell, MD  guaiFENesin-dextromethorphan [facility] DM) 100-10 MG/5ML syrup Take 10 mLs by mouth every 4 (four) hours as needed for cough. 08/05/19  Yes Georgette Shell, MD  HYDROcodone-acetaminophen (NORCO/VICODIN) 5-325 MG tablet Take 1-2 tablets by mouth every 6 (six) hours as needed for moderate pain.   Yes [provider]  HYDROcodone-homatropine (HYCODAN) 5-1.5 MG/5ML syrup Take 5 mLs by mouth every 6 (six) hours as needed for cough. 08/05/19  Yes Georgette Shell, MD   letrozole [facility]) 2.5 MG tablet Take 1 tablet (2.5 mg total) by mouth daily. 03/22/19  Yes Brunetta Genera, MD  omeprazole (PRILOSEC) 10 MG capsule Take 20 mg by mouth daily as needed (heartburn).    Yes [provider]  ondansetron (ZOFRAN) 8 MG tablet Take 1 tablet (8 mg total) by mouth every 8 (eight) hours as needed for nausea or vomiting. 03/26/19  Yes Brunetta Genera, MD  PARoxetine (PAXIL) 10 MG tablet Take 10 mg by mouth daily.   Yes [provider]  VERZENIO 100 MG tablet TAKE 1 TABLET BY MOUTH 2 TIMES DAILY. SWALLOW TABLETS WHOLE. DO NOT CHEW, CRUSH, OR SPLIT TABLETS BEFORE SWALLOWING. 08/19/19  Yes Brunetta Genera, MD  Magnesium 500 MG TABS Take 1 tablet (500 mg total) by mouth in the morning and at bedtime. 08/21/19   Brunetta Genera, MD    Physical Exam: BP (!) 161/96  Pulse (!) 110   Temp 99.7 F (37.6 C) (Oral)   Resp (!) 28   SpO2 95%   General:  Alert & oriented x 3, mild distress secondary to diarrhea, nausea  Eyes: Left eye proptotic.  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 No acute discharge, but is red.  Spacer in place  ENT: Normocephalic, atraumatic, mucous membranes slightly dry  Neck: thick, narrow airway  Cardiovascular: Reg rhythm, tachycardic  Respiratory: clear to auscultation bilaterally  Abdomen: soft, non tender, distended, hypoactive bowel sounds  Skin: no skin breaks, tears, or lesions  Musculoskeletal: no clubbing, cyanosis, or edema  Psychiatric: appropriate, no evidence of psychosis  Neurologic: acute neuro deficits           Labs on Admission:  Basic Metabolic Panel: Recent Labs  Lab 09/04/19 0309  NA 140  K 4.0  CL 104  CO2 25  GLUCOSE 128*  BUN 15  CREATININE 1.27*  CALCIUM 9.0   Liver Function Tests: Recent Labs  Lab 09/04/19 0309  AST 120*  ALT 108*  ALKPHOS 146*  BILITOT 0.9  PROT 7.9  ALBUMIN 3.7   Recent Labs  Lab 09/04/19 0309  LIPASE 38   No results for input(s): AMMONIA in the last  168 hours. CBC: Recent Labs  Lab 09/04/19 0309  WBC 5.4  NEUTROABS 4.2  HGB 12.3  HCT 38.4  MCV 104.1*  PLT 280   Cardiac Enzymes: No results for input(s): CKTOTAL, CKMB, CKMBINDEX, TROPONINI in the last 168 hours.  BNP (last 3 results) Recent Labs    09/04/19 0309  BNP 39.8    ProBNP (last 3 results) No results for input(s): PROBNP in the last 8760 hours.  CBG: No results for input(s): GLUCAP in the last 168 hours.  Radiological Exams on Admission: CT ABDOMEN PELVIS W CONTRAST  Result Date: 09/04/2019 CLINICAL DATA:  Upper abdominal pain with nausea, vomiting, and diarrhea. EXAM: CT ABDOMEN AND PELVIS WITH CONTRAST TECHNIQUE: Multidetector CT imaging of the abdomen and pelvis was performed using the standard protocol following bolus administration of intravenous contrast. CONTRAST:  171mL OMNIPAQUE IOHEXOL 300 MG/ML  SOLN COMPARISON:  Abdominal ultrasound 07/03/2019 FINDINGS: Lower chest: History of breast cancer with postoperative changes seen inferiorly on the right where there was a previously a measured mass.Atrial septal occluder. Mild atelectatic type opacity at the bases. Hepatobiliary: Hepatic steatosis. No evidence of biliary obstruction or stone. Pancreas: Unremarkable. Spleen: Unremarkable. Adrenals/Urinary Tract: Negative adrenals. No hydronephrosis or stone. Unremarkable bladder. Stomach/Bowel: No obstruction or visible inflammation. Appendicolith. No appendicitis. Vascular/Lymphatic: No acute vascular abnormality. No mass or adenopathy. Reproductive:Fibroid appearance at the left body and fundus measuring 35 mm. Other: No ascites or pneumoperitoneum. Musculoskeletal: No acute abnormalities. 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 Prior low lumbar surgery. IMPRESSION: 1. No acute finding. 2. Hepatic steatosis. Electronically Signed   By: Monte Fantasia M.D.   On: 09/04/2019 07:57   DG Chest Portable 1 View  Result Date: 09/04/2019 CLINICAL DATA:   Fever and body aches EXAM: PORTABLE CHEST 1 VIEW COMPARISON:  08/04/2019 FINDINGS: Streaky density at the lung bases which is mild. Lung volumes are low. There is no edema, consolidation, effusion, or pneumothorax. Normal heart size and mediastinal contours. 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 IMPRESSION: Low volume chest with mild atelectatic opacity at the bases. Electronically Signed   By: Monte Fantasia M.D.   On: 09/04/2019 06:50    EKG: Independently reviewed.  Sinus tachycardia with right atrial enlargement, RSR' in V1 or V2,  probably normal variant, Prolonged QT interval.  This is similar to previous EKG although heart rate is now faster   Assessment/Plan Present on Admission: . Sepsis secondary to presumed infectious abdominal process/diarrhea causing N/V/D: Patient meets criteria for sepsis on admission given tachycardia & tachypnea, & lactic acidosis from GI/diarrhea source.  Blood, C.diff & stool cultures are pending.  Will start Zosyn.  Will hold on anti-diarrheal agents until C.Diff cultures back & are negative.  Could possibly indeed be C.Diff.  Received antibiotics briefly a month ago.  Is on PPI.   Marland Kitchen Anxiety disorder: prn benzo.  . Insomnia  . Breast cancer [facility]): continue daily chemo  . GERD (gastroesophageal reflux disease): holding any PPI  . Hyperlipidemia: holding statin for now, while transaminases up  . CKD (chronic kidney disease), stage III: appears to be at baseline.  . Depression: cont home meds  . Malignant hypertension: new finding.  Patient says no previous history of hypertension.  Would expect w/ N/V/D, low BP.  IV Lopressor for now.  BNP not elevated, but given transaminities, I am still suspicious of diastolic CHF.  Checking echo.  Echo in 2007 comments on diastolic CHF.  I question if a lot of her symptoms including malignant hypertension and some nausea and vomiting and tachycardia could be from withdrawals although I have reviewed her records closely and  there is no evidence of drug use, manipulative behavior.  She is on opiates, but appears to have always followed prescription counts and contract with her PCP.  Dysphagia: has had issues in past with swallowing.    . Transaminitis: Curious.  ?hepatic congestion from volume overload.  See above.  . Hepatic steatosis: noted on CT  Obesity: Meets criteria with BMI >30  R eye socket discharge: Patient had her right eye removed sometime ago.  She last saw her ophthalmologist approximately a month or so ago and at that time, a spacer was put in to replace the previous spacer which was too small.  Patient is still waiting for insurance approval for her prosthetic eye.  Patient feels like this current spacer is a little too big and seems to irritate her eye socket.  A few weeks back, she noted some drainage and redness in that eye socket.  In my exam, her eye does look irritated but there is no active drainage.  Looks to be irritant from eye spacer too large.  Have advised patient to remove spacer, keep area clean.  For now, triple antibiotic cream on eye.  Principal Problem:   Sepsis ([place]) Active Problems:   Anxiety disorder   Insomnia   Breast cancer (HCC)   Diarrhea   GERD (gastroesophageal reflux disease)   Hyperlipidemia   CKD (chronic kidney disease), stage III   Depression   Malignant hypertension   Transaminitis   Lactic acidosis   Hepatic steatosis   Nausea & vomiting Dysphagia  DVT prophylaxis: Lovenox  Code Status: Full Code as confirmed by patient   Family Communication: husband at bedside   Disposition Plan: Anticipate here in hospital for several days until sepsis, diarrhea & BP stabilized.  Consults called: None   Admission status: Given need for acute hospital services & expectation for being here past 2 midnights, will admit as inpatient     Annita Brod MD [facility] Pager [phone]  If 7PM-7AM, please contact  night-coverage [email or web address] Password [facility]  09/04/2019, 4:53 PM

## 2019-09-04 NOTE — ED Provider Notes (Signed)
Patient presents with low-grade fever, epigastric abdominal pain and lots of diarrhea with nausea vomiting.  She states when I looked at her at 630 that she "feels bad.  She states she still very nauseated despite getting Zofran.  She has been having some epigastric abdominal pain and diarrhea.  Her heart rate has been as high as 160 currently is 130 on the monitor.  Her blood pressure is 180/94.  She denies cough or chest pain.  Medical screening examination/treatment/procedure(s) were conducted as a shared visit with non-physician practitioner(s) and myself.  I personally evaluated the patient during the encounter.  EKG Interpretation  Date/Time:  Wednesday September 04 2019 04:22:23 EDT Ventricular Rate:  150 PR Interval:    QRS Duration: 86 QT Interval:  380 QTC Calculation: 601 R Axis:   77 Text Interpretation: Sinus tachycardia Consider right atrial enlargement RSR' in V1 or V2, probably normal variant Prolonged QT interval Since last tracing rate faster 04 Aug 2019 Confirmed by Rolland Porter [phone]) on 09/04/2019 4:30:13 AM   Rolland Porter, MD, Barbette Or, MD 09/04/19 [phone]

## 2019-09-04 NOTE — ED Provider Notes (Signed)
[facility] DEPT Provider Note   CSN: [id number] Arrival date & time: 09/04/19  0205     History Chief Complaint  Patient presents with  . Abdominal Pain    Brandy [name] is a 54 y.o. female.  Patient with history of migraine, GERD, ASD s/p repair (2006), recently diagnosed metastatic breast CA (currently on oral chemo), presents with severe epigastric abdominal pain that started last night around 8:00 pm. She states she had some mild, off-and-on symptoms during the day then ate hamburger helper, had a PEpsi and ate some cookies which she reports are all foods that have caused an exacerbation of her GERD symptoms. She currently takes Prilosec. Shortly after the epigastric pain, she developed nausea and had vomiting and explosive diarrhea, both non-bloody. After having multiple episodes emesis and diarrhea, with ongoing, now constant severe abdominal pain, EMS was called and she was transported to the hospital. She denies fever, SOB, cough, congestion.   The history is provided by the patient. No language interpreter was used.       Past Medical History:  Diagnosis Date  . Arthritis   . ASD (atrial septal defect)    s/p closure with Amplatzer device 10/05/04 (Dr. Myriam Jacobson, [facility]) 10/05/04  . Cataract   . GERD (gastroesophageal reflux disease)   . Headache   . Heart murmur    no longer heard  . Legally blind in right eye, as defined in [place]   . Lumbar herniated disc   . Sciatica     Patient Active Problem List   Diagnosis Date Noted  . Pneumonitis 08/04/2019  . Breast cancer ([place]) 08/04/2019  . Tachycardia   . Acute pansinusitis 08/02/2017  . Insomnia 08/02/2017  . Low back pain 08/02/2017  . Migraine 08/02/2017  . Neuropathy 08/02/2017  . 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 Left shoulder tendonitis 05/23/2016    Class: Acute  . Post-op pain 05/23/2016  . Anxiety disorder  01/07/2015    Past Surgical History:  Procedure Laterality Date  . ABLATION    . BREAST SURGERY Bilateral 2011   Breast Reduction Surgery  . BUNIONECTOMY    . CARDIAC CATHETERIZATION     10/05/04 [facility]): LM < 25%, otherwise normal coronaries. No pulmonary HTN, Mildly enlarged RV. Secundum ASD s/p closure.  Marland Kitchen CARDIAC SURGERY    . CATARACT EXTRACTION    . CLEFT PALATE REPAIR     s/p cleft lip and palate repair  . LUMBAR LAMINECTOMY/DECOMPRESSION MICRODISCECTOMY Left 05/23/2016   Procedure: LEFT L4-L5 LATERAL RECESS DECOMPRESSION WITH CENTRAL AND RIGHT DECOMPRESSION VIA LEFT SIDE;  Surgeon: [name], MD;  Location: [place];  Service: Orthopedics;  Laterality: Left;  . LUMBAR LAMINECTOMY/DECOMPRESSION MICRODISCECTOMY Left 05/23/2016   Procedure: LUMBAR LAMINECTOMY/DECOMPRESSION MICRODISCECTOMY Lumbar five - Sacral One 1 LEVEL;  Surgeon: [name], MD;  Location: [place];  Service: Orthopedics;  Laterality: Left;  . SHOULDER INJECTION Left 05/23/2016   Procedure: SHOULDER INJECTION;  Surgeon: [name], MD;  Location: [place];  Service: Orthopedics;  Laterality: Left;  band-aid per pa-c  . TRANSTHORACIC ECHOCARDIOGRAM     12/15/05 [facility]): Mild LVH, EF > [id number], grade 1 diastolic dysfunction, Trivial MR/PR/TR.     OB History   No obstetric history on file.     Family History  Problem Relation Age of Onset  . Diabetes Mother   . High blood pressure Mother   . Cancer Father  Lung  . Colon cancer Neg Hx   . Colon polyps Neg Hx   . Esophageal cancer Neg Hx   . Rectal cancer Neg Hx   . Stomach cancer Neg Hx     Social History   Tobacco Use  . Smoking status: Current Every Day Smoker    Packs/day: 0.50    Types: Cigarettes  . Smokeless tobacco: Never Used  Substance Use Topics  . Alcohol use: No  . Drug use: No    Home Medications Prior to Admission medications   Medication Sig Start Date End Date Taking? Authorizing Provider  albuterol (VENTOLIN HFA) 108 (90  Base) MCG/ACT inhaler Inhale 2 puffs into the lungs every 6 (six) hours as needed for wheezing or shortness of breath. 06/06/19   Tanner, Lyndon Code., PA-C  ALPRAZolam [name]) 1 MG tablet Take 1 mg by mouth daily as needed for anxiety.  02/14/19   [provider]  amitriptyline (ELAVIL) 75 MG tablet Take 75 mg by mouth at bedtime.  01/27/15   [provider]  benzonatate (TESSALON) 100 MG capsule Take 1 capsule (100 mg total) by mouth 3 (three) times daily as needed for cough. 04/22/19   Brunetta Genera, MD  chlorpheniramine-HYDROcodone (TUSSIONEX) 10-8 MG/5ML SUER Take 5 mLs by mouth at bedtime as needed for cough. 06/06/19   Tanner, Lyndon Code., PA-C  cyclobenzaprine (FLEXERIL) 10 MG tablet Take 1 tablet (10 mg total) by mouth 2 (two) times daily as needed for muscle spasms. 02/18/19   Robinson, [place] N, PA-C  ergocalciferol (VITAMIN D2) 1.25 MG (50000 UT) capsule Take 1 capsule (50,000 Units total) by mouth once a week. 03/08/19   Brunetta Genera, MD  fluticasone (FLOVENT HFA) 110 MCG/ACT inhaler Inhale 2 puffs into the lungs 2 (two) times daily. 06/06/19   Tanner, Lyndon Code., PA-C  guaiFENesin (MUCINEX) 600 MG 12 hr tablet Take 1 tablet (600 mg total) by mouth 2 (two) times daily. 08/05/19 08/04/20  Georgette Shell, MD  guaiFENesin-dextromethorphan (ROBITUSSIN DM) 100-10 MG/5ML syrup Take 10 mLs by mouth every 4 (four) hours as needed for cough. 08/05/19   Georgette Shell, MD  HYDROcodone-Acetaminophen 7.5-300 MG TABS Take 1 tablet by mouth 3 (three) times daily as needed. 05/29/19   Brunetta Genera, MD  HYDROcodone-homatropine [facility]) 5-1.5 MG/5ML syrup Take 5 mLs by mouth every 6 (six) hours as needed for cough. 08/05/19   Georgette Shell, MD  letrozole [facility]) 2.5 MG tablet Take 1 tablet (2.5 mg total) by mouth daily. 03/22/19   Brunetta Genera, MD  Magnesium 500 MG TABS Take 1 tablet (500 mg total) by mouth in the morning and at bedtime. 08/21/19   Brunetta Genera, MD  omeprazole (PRILOSEC) 10 MG capsule Take 10 mg by mouth daily as needed (heartburn).     [provider]  ondansetron (ZOFRAN) 8 MG tablet Take 1 tablet (8 mg total) by mouth every 8 (eight) hours as needed for nausea or vomiting. 03/26/19   Brunetta Genera, MD  VERZENIO 100 MG tablet TAKE 1 TABLET BY MOUTH 2 TIMES DAILY. SWALLOW TABLETS WHOLE. DO NOT CHEW, CRUSH, OR SPLIT TABLETS BEFORE SWALLOWING. 08/19/19   Brunetta Genera, MD    Allergies    Zithromax [azithromycin] and Psyllium  Review of Systems   Review of Systems  Constitutional: Negative for chills and fever.  HENT: Negative.   Respiratory: Negative.  Negative for shortness of breath.   Cardiovascular: Negative.  Negative for chest  pain.  Gastrointestinal: Positive for abdominal pain, diarrhea, nausea and vomiting.  Genitourinary: Negative for dysuria.  Musculoskeletal: Negative.   Skin: Negative.   Neurological: Negative.  Negative for weakness and light-headedness.    Physical Exam Updated Vital Signs BP (!) 185/96   Pulse (!) 142   Temp 100.1 F (37.8 C)   Resp (!) 30   SpO2 (!) 84%   Physical Exam Vitals and nursing note reviewed.  Constitutional:      Appearance: She is well-developed.     Comments: Uncomfortable appearing.  HENT:     Head: Normocephalic.  Cardiovascular:     Rate and Rhythm: Regular rhythm. Tachycardia present.  Pulmonary:     Effort: Pulmonary effort is normal.     Breath sounds: Normal breath sounds.  Abdominal:     General: Bowel sounds are normal. There is no distension.     Palpations: Abdomen is soft.     Tenderness: There is abdominal tenderness in the epigastric area. There is no guarding or rebound.  Musculoskeletal:        General: Normal range of motion.     Cervical back: Normal range of motion and neck supple.  Skin:    General: Skin is warm and dry.     Findings: No rash.  Neurological:     Mental Status: She is alert.     Cranial  Nerves: No cranial nerve deficit.     ED Results / Procedures / Treatments   Labs (all labs ordered are listed, but only abnormal results are displayed) Labs Reviewed  CBC WITH DIFFERENTIAL/PLATELET - Abnormal; Notable for the following components:      Result Value   RBC 3.69 (*)    MCV 104.1 (*)    All other components within normal limits  COMPREHENSIVE METABOLIC PANEL  LIPASE, BLOOD  URINALYSIS, ROUTINE W REFLEX MICROSCOPIC    EKG None  Radiology No results found.  Procedures Procedures (including critical care time)  Medications Ordered in ED Medications  acetaminophen (TYLENOL) tablet 1,000 mg (has no administration in time range)  sodium chloride 0.9 % bolus 1,000 mL (has no administration in time range)  HYDROmorphone (DILAUDID) injection 0.5 mg (0.5 mg Intravenous Given 09/04/19 0329)    ED Course  I have reviewed the triage vital signs and the nursing notes.  Pertinent labs & imaging results that were available during my care of the patient were reviewed by me and considered in my medical decision making (see chart for details).    MDM Rules/Calculators/A&P                      Patient to ED with epigastric pain, N, V, diarrhea since yesterday that she relates to h/o GERD after ingesting food/drink known to cause symptoms. No fever.   She is uncomfortable appearing, but symptoms are easily controlled with Zofran and low dose dilaudid. IVF's running. She is found to be significantly tachycardic. H/o tachycardia but documented rates significantly lower. No chest pain. SOB only with gagging prior to emesis. No hematemesis.   The patient is recently diagnosed with breast CA, on oral chemo. No history of atrial fibrillation. EKG shows sinus tachycardia. IVF's ordered.   6:20 - After she drank water with Tylenol dose, her epigastric pain returned immediately, she had an episode of diarrhea and onset of nausea with diaphoresis. HR had improved to 130 with fluids but  has increased again with onset of symptoms.   She has been seen by  Dr. Tomi Bamberger. Given recurrent pain, further diarrhea/nausea, will obtain CT abdomen for further evaluation. Do not feel there is presence of acute cardiac event, PE, dissection. Her symptoms have related to eating/drinking and are predominantly vomiting and diarrhea having had multiple episodes.   Patient care signed out to Brandy Salvage PA-C pending further hydration, re-eval of tachycardia, final labs, CT results and recheck of the patient to determine disposition.   Final Clinical Impression(s) / ED Diagnoses Final diagnoses:  None   1. Abdominal pain 2. Vomiting and diarrhea 3. Tachycardia   Rx / DC Orders ED Discharge Orders    None       Charlann Lange, PA-C 09/04/19 0710    Rolland Porter, MD 09/06/19 [id number]

## 2019-09-04 NOTE — Progress Notes (Signed)
Pharmacy Antibiotic Note  Brandy [name] is a 54 y.o. female admitted on 09/04/2019 with sepsis and diarrhea.  Pharmacy has been consulted for Zosyn dosing.  Plan:  Zosyn 3.375 g IV given once over 30 minutes, then every 8 hrs by 4-hr infusion     Temp (24hrs), Avg:99.6 F (37.6 C), Min:99.1 F (37.3 C), Max:100.1 F (37.8 C)  Recent Labs  Lab 09/04/19 0309 09/04/19 0520 09/04/19 0755 09/04/19 1634  WBC 5.4  --   --   --   CREATININE 1.27*  --   --   --   LATICACIDVEN  --  2.0* 1.7 1.7    CrCl cannot be calculated (Unknown ideal weight.).    Allergies  Allergen Reactions  . Zithromax [Azithromycin] Shortness Of Breath and Itching    TOTAL BODY ITCHING [EVEN SOLES OF FEET] WHEEZING   . Psyllium Nausea And Vomiting    Thank you for allowing pharmacy to be a part of this patient's care.  Ciena Sampley A 09/04/2019 5:10 PM

## 2019-09-05 ENCOUNTER — Inpatient Hospital Stay (HOSPITAL_COMMUNITY): Payer: BC Managed Care – PPO

## 2019-09-05 DIAGNOSIS — A419 Sepsis, unspecified organism: Principal | ICD-10-CM

## 2019-09-05 DIAGNOSIS — I5033 Acute on chronic diastolic (congestive) heart failure: Secondary | ICD-10-CM

## 2019-09-05 LAB — CBC
HCT: 33.6 % — ABNORMAL LOW (ref 36.0–46.0)
Hemoglobin: 10.6 g/dL — ABNORMAL LOW (ref 12.0–15.0)
MCH: 33.9 pg (ref 26.0–34.0)
MCHC: 31.5 g/dL (ref 30.0–36.0)
MCV: 107.3 fL — ABNORMAL HIGH (ref 80.0–100.0)
Platelets: 200 10*3/uL (ref 150–400)
RBC: 3.13 MIL/uL — ABNORMAL LOW (ref 3.87–5.11)
RDW: 14.6 % (ref 11.5–15.5)
WBC: 3.1 10*3/uL — ABNORMAL LOW (ref 4.0–10.5)
nRBC: 0 % (ref 0.0–0.2)

## 2019-09-05 LAB — ECHOCARDIOGRAM COMPLETE
Height: 67 in
Weight: 3689.62 oz

## 2019-09-05 LAB — COMPREHENSIVE METABOLIC PANEL
ALT: 61 U/L — ABNORMAL HIGH (ref 0–44)
AST: 46 U/L — ABNORMAL HIGH (ref 15–41)
Albumin: 2.8 g/dL — ABNORMAL LOW (ref 3.5–5.0)
Alkaline Phosphatase: 101 U/L (ref 38–126)
Anion gap: 7 (ref 5–15)
BUN: 12 mg/dL (ref 6–20)
CO2: 24 mmol/L (ref 22–32)
Calcium: 8 mg/dL — ABNORMAL LOW (ref 8.9–10.3)
Chloride: 106 mmol/L (ref 98–111)
Creatinine, Ser: 1.07 mg/dL — ABNORMAL HIGH (ref 0.44–1.00)
GFR calc Af Amer: >60 mL/min (ref >60)
GFR calc non Af Amer: 58 mL/min — ABNORMAL LOW (ref >60)
Glucose, Bld: 118 mg/dL — ABNORMAL HIGH (ref 70–99)
Potassium: 3.6 mmol/L (ref 3.5–5.1)
Sodium: 137 mmol/L (ref 135–145)
Total Bilirubin: 0.7 mg/dL (ref 0.3–1.2)
Total Protein: 6.3 g/dL — ABNORMAL LOW (ref 6.5–8.1)

## 2019-09-05 LAB — URINE CULTURE

## 2019-09-05 LAB — HIV ANTIBODY (ROUTINE TESTING W REFLEX): HIV Screen 4th Generation wRfx: NONREACTIVE

## 2019-09-05 LAB — LIPASE, BLOOD: Lipase: 36 U/L (ref 11–51)

## 2019-09-05 LAB — PROCALCITONIN: Procalcitonin: 0.32 ng/mL

## 2019-09-05 MED ORDER — SIMETHICONE 80 MG PO CHEW
80.0000 mg | CHEWABLE_TABLET | Freq: Once | ORAL | Status: AC
  Administered 2019-09-05: 80 mg via ORAL
  Filled 2019-09-05: qty 1

## 2019-09-05 MED ORDER — SUCRALFATE 1 GM/10ML PO SUSP
1.0000 g | Freq: Three times a day (TID) | ORAL | Status: DC
  Administered 2019-09-05 – 2019-09-08 (×8): 1 g via ORAL
  Filled 2019-09-05 (×9): qty 10

## 2019-09-05 MED ORDER — SCOPOLAMINE 1 MG/3DAYS TD PT72
1.0000 | MEDICATED_PATCH | TRANSDERMAL | Status: DC
  Administered 2019-09-05: 1.5 mg via TRANSDERMAL
  Filled 2019-09-05: qty 1

## 2019-09-05 MED ORDER — PANTOPRAZOLE SODIUM 40 MG PO TBEC
40.0000 mg | DELAYED_RELEASE_TABLET | Freq: Two times a day (BID) | ORAL | Status: DC
  Administered 2019-09-05 – 2019-09-08 (×6): 40 mg via ORAL
  Filled 2019-09-05 (×6): qty 1

## 2019-09-05 MED ORDER — SIMETHICONE 80 MG PO CHEW
80.0000 mg | CHEWABLE_TABLET | Freq: Four times a day (QID) | ORAL | Status: DC | PRN
  Administered 2019-09-05: 80 mg via ORAL
  Filled 2019-09-05: qty 1

## 2019-09-05 NOTE — Progress Notes (Signed)
MEWS score now green.  

## 2019-09-05 NOTE — Progress Notes (Signed)
  Echocardiogram 2D Echocardiogram has been performed.  Brandy [name] 09/05/2019, 3:24 PM

## 2019-09-05 NOTE — Progress Notes (Addendum)
Landis Gandy, MD paged d/t pt unable to provide a stool sample for c.diff testing. Pt concerned she has a GI bleed and is requesting to use a hemocult card. Pt aslo c/o continued indigestion.   MD returned paged and advised to remove enteric precautions since pt is not having loose stools. A hemocult can be collected if the pt is able to have a BM; pt advised not to digitally remove stool. Verbal orders given for Simethicone 80 mg PO q-6 PRN for indigestion.

## 2019-09-05 NOTE — Progress Notes (Signed)
PROGRESS NOTE    Brandy [name]  [id number] DOB: June 04, 1966 DOA: 09/04/2019 PCP: [facility]   Brief Narrative: 54 year old female with history of recently diagnosed metastatic breast cancer on oral chemo, migraine, GERD, hiatal hernia, ASD status post repair in 2006 admitted with severe abdominal pain nausea vomiting and diarrhea after eating hamburger helper and cookies and Pepsi.  She reported a fever of 103 at home however in the hospital her T-max is 100.1.  She has had no diarrhea since yesterday no vomiting since yesterday and her abdominal pain has resolved. Her main complaint today is severe GERD. She is tachycardic in the low 100s.  Looking back in her chart from previous admission and prior to that she has been tachycardic in the low 100s.  She has never had cardiac work-up in the past.  She does not check her vital signs at home.  Assessment & Plan:   Principal Problem:   Sepsis ([place]) Active Problems:   Anxiety disorder   Insomnia   Breast cancer ([place])   Diarrhea   GERD (gastroesophageal reflux disease)   Hyperlipidemia   CKD (chronic kidney disease), stage III   Depression   Malignant hypertension   Transaminitis   Lactic acidosis   Hepatic steatosis   Nausea & vomiting   Obesity (BMI 30-39.9)    #1 severe GERD patient admitted with abdominal pain nausea vomiting and diarrhea after eating hamburger helper and drinking Pepsi.  She does not have a history of peptic ulcer disease however she has a history of GERD and hiatal hernia.  Since she has not had any more episodes of diarrhea they were unable to collect her stool for C. Difficile.   She has been afebrile with a normal white count(keeping in mind she could be immunosuppressant due to chemo) and mildly elevated lactic acid. Procalcitonin 0.32 Lactic acid 1.7 from 2 Patient prefers to hold off on antibiotics and observe if possible.  Will hold her antibiotics and observe off  antibiotics. She reported her stool was dark I will check FOBT. Increase Protonix to twice a day Start simethicone and sucralfate continue Maalox Keep her on clear liquids for now. CT scan of the abdomen and pelvis done in the ER shows no acute findings other than hepatic steatosis. Urine culture multiple species present will repeat with clean-catch urine.  #2 sinus tachycardia likely exacerbated by #1.  However will check TSH and echocardiogram which already has been ordered.  #3  Metastatic breast cancer on chemotherapy oral  #4 CKD stage III stable  #5 anxiety disorder/depression continue home meds  #6 hyperlipidemia holding statins due to elevated AST ALT  #7 chronic dysphagia she has had multiple work-up as an outpatient.  #8 hypertension blood pressure 140/65 no history of hypertension prior to admission to hospital likely related to #1.  #9 right eye socket discharge I covered with dressing and spacer removed for the time being.  #10 elevated LFTs alkaline phosphatase 146, AST 120, ALT 108 on admission.  Alkaline phosphatase down to 101, AST 46, ALT 61.  ?  Due to fatty liver  Estimated body mass index is 36.12 kg/m as calculated from the following:   Height as of this encounter: 5\' 7"  (1.702 m).   Weight as of this encounter: 104.6 kg.  DVT prophylaxis: Lovenox Code Status: Full code Family Communication: Discussed with patient disposition Plan: Patient came from home Plan will be to discharge her home Barriers to discharge patient admitted with nausea vomiting and  diarrhea has not had any p.o. intake yet still on clear liquids. Echo pending PT pending  Consultants:   None  Procedures: None Antimicrobials: Anti-infectives (From admission, onward)   Start     Dose/Rate Route Frequency Ordered Stop   09/04/19 2300  piperacillin-tazobactam (ZOSYN) IVPB 3.375 g  Status:  Discontinued     3.375 g 12.5 mL/hr over 240 Minutes Intravenous Every 8 hours 09/04/19 1709  09/04/19 1917   09/04/19 2200  cefTRIAXone (ROCEPHIN) 2 g in sodium chloride 0.9 % 100 mL IVPB  Status:  Discontinued     2 g 200 mL/hr over 30 Minutes Intravenous Every 24 hours 09/04/19 1759 09/05/19 1322   09/04/19 2200  metroNIDAZOLE (FLAGYL) IVPB 500 mg  Status:  Discontinued     500 mg 100 mL/hr over 60 Minutes Intravenous Every 8 hours 09/04/19 1759 09/05/19 1322   09/04/19 1715  piperacillin-tazobactam (ZOSYN) IVPB 3.375 g     3.375 g 100 mL/hr over 30 Minutes Intravenous  Once 09/04/19 1709 09/04/19 1854     Subjective: Patient denies any vomiting since yesterday no diarrhea since yesterday however remains nauseated  Objective: Vitals:   09/05/19 0000 09/05/19 0454 09/05/19 1014 09/05/19 1236  BP: 134/69 137/70  140/65  Pulse: 99 94  97  Resp:  17    Temp: 98.3 F (36.8 C) 98.1 F (36.7 C) 98.3 F (36.8 C)   TempSrc: Oral Oral Oral   SpO2: 95% 92%    Weight:      Height:        Intake/Output Summary (Last 24 hours) at 09/05/2019 1305 Last data filed at 09/05/2019 1000 Gross per 24 hour  Intake 2377.79 ml  Output --  Net 2377.79 ml   Filed Weights   09/04/19 1757  Weight: 104.6 kg    Examination: Right thigh covered with dressing General exam: Appears calm and comfortable  Respiratory system: Clear to auscultation. Respiratory effort normal. Cardiovascular system: S1 & S2 heard, RRR. No JVD, murmurs, rubs, gallops or clicks. No pedal edema. Gastrointestinal system: Abdomen is distended, soft and nontender. No organomegaly or masses felt. Normal bowel sounds heard. Central nervous system: Alert and oriented. No focal neurological deficits. Extremities: Symmetric 5 x 5 power. Skin: No rashes, lesions or ulcers Psychiatry: Judgement and insight appear normal. Mood & affect appropriate.     Data Reviewed: I have personally reviewed following labs and imaging studies  CBC: Recent Labs  Lab 09/04/19 0309 09/05/19 0437  WBC 5.4 3.1*  NEUTROABS 4.2  --    HGB 12.3 10.6*  HCT 38.4 33.6*  MCV 104.1* 107.3*  PLT 280 [id number]   Basic Metabolic Panel: Recent Labs  Lab 09/04/19 0309 09/05/19 0437  NA 140 137  K 4.0 3.6  CL 104 106  CO2 25 24  GLUCOSE 128* 118*  BUN 15 12  CREATININE 1.27* 1.07*  CALCIUM 9.0 8.0*   GFR: Estimated Creatinine Clearance: 73.9 mL/min (A) (by C-G formula based on SCr of 1.07 mg/dL (H)). Liver Function Tests: Recent Labs  Lab 09/04/19 0309 09/05/19 0437  AST 120* 46*  ALT 108* 61*  ALKPHOS 146* 101  BILITOT 0.9 0.7  PROT 7.9 6.3*  ALBUMIN 3.7 2.8*   Recent Labs  Lab 09/04/19 0309  LIPASE 38   No results for input(s): AMMONIA in the last 168 hours. Coagulation Profile: No results for input(s): INR, PROTIME in the last 168 hours. Cardiac Enzymes: No results for input(s): CKTOTAL, CKMB, CKMBINDEX, TROPONINI in the last 168 hours.  BNP (last 3 results) No results for input(s): PROBNP in the last 8760 hours. HbA1C: No results for input(s): HGBA1C in the last 72 hours. CBG: No results for input(s): GLUCAP in the last 168 hours. Lipid Profile: No results for input(s): CHOL, HDL, LDLCALC, TRIG, CHOLHDL, LDLDIRECT in the last 72 hours. Thyroid Function Tests: No results for input(s): TSH, T4TOTAL, FREET4, T3FREE, THYROIDAB in the last 72 hours. Anemia Panel: No results for input(s): VITAMINB12, FOLATE, FERRITIN, TIBC, IRON, RETICCTPCT in the last 72 hours. Sepsis Labs: Recent Labs  Lab 09/04/19 0520 09/04/19 0755 09/04/19 1634 09/05/19 0437  PROCALCITON  --   --   --  0.32  LATICACIDVEN 2.0* 1.7 1.7  --     Recent Results (from the past 240 hour(s))  Culture, blood (routine x 2)     Status: None (Preliminary result)   Collection Time: 09/04/19  5:04 AM   Specimen: BLOOD  Result Value Ref Range Status   Specimen Description   Final    BLOOD RIGHT ARM Performed at [facility] [address] [place], [place] [address]    Special Requests   Final    BOTTLES DRAWN  AEROBIC AND ANAEROBIC Blood Culture results may not be optimal due to an inadequate volume of blood received in culture bottles Performed at [facility] [address], [place], [place] [address]    Culture   Final    NO GROWTH 1 DAY Performed at [facility], [place] [address], [place], [place] [address]    Report Status PENDING  Incomplete  Culture, blood (routine x 2)     Status: None (Preliminary result)   Collection Time: 09/04/19  5:20 AM   Specimen: BLOOD  Result Value Ref Range Status   Specimen Description   Final    BLOOD BLOOD LEFT FOREARM Performed at [facility] [address] [place], [place] [address]    Special Requests   Final    BOTTLES DRAWN AEROBIC AND ANAEROBIC Blood Culture adequate volume Performed at [facility] [address] [place], [place] [address]    Culture   Final    NO GROWTH 1 DAY Performed at [facility], [place] [address], [place], [place] [address]    Report Status PENDING  Incomplete  SARS CORONAVIRUS 2 (TAT 6-24 HRS) Nasopharyngeal Nasopharyngeal Swab     Status: None   Collection Time: 09/04/19  5:20 AM   Specimen: Nasopharyngeal Swab  Result Value Ref Range Status   SARS Coronavirus 2 NEGATIVE NEGATIVE Final    Comment: (NOTE) SARS-CoV-2 target nucleic acids are NOT DETECTED. The SARS-CoV-2 RNA is generally detectable in upper and lower respiratory specimens during the acute phase of infection. Negative results do not preclude SARS-CoV-2 infection, do not rule out co-infections with other pathogens, and should not be used as the sole basis for treatment or other patient management decisions. Negative results must be combined with clinical observations, patient history, and epidemiological information. The expected result is Negative. Fact Sheet for Patients: SugarRoll.be Fact Sheet for Healthcare  Providers: https://www.[facility].com/ This test is not yet approved or cleared by the [place] FDA and  has been authorized for detection and/or diagnosis of SARS-CoV-2 by FDA under an Emergency Use Authorization (EUA). This EUA will remain  in effect (meaning this test can be used) for the duration of the COVID-19 declaration under Section 56 4(b)(1) of the Act, 21 U.S.C. section 360bbb-3(b)(1), unless the authorization is  terminated or revoked sooner. Performed at [facility], [place] [address], [place], [place] [address]   Urine culture     Status: Abnormal   Collection Time: 09/04/19  9:53 AM   Specimen: Urine, Clean Catch  Result Value Ref Range Status   Specimen Description   Final    URINE, CLEAN CATCH Performed at [facility], [place] [address], [place], [place] [address]    Special Requests   Final    NONE Performed at [facility], [place] [address], [place], [place] [address]    Culture MULTIPLE SPECIES PRESENT, SUGGEST RECOLLECTION (A)  Final   Report Status 09/05/2019 FINAL  Final         Radiology Studies: CT ABDOMEN PELVIS W CONTRAST  Result Date: 09/04/2019 CLINICAL DATA:  Upper abdominal pain with nausea, vomiting, and diarrhea. EXAM: CT ABDOMEN AND PELVIS WITH CONTRAST TECHNIQUE: Multidetector CT imaging of the abdomen and pelvis was performed using the standard protocol following bolus administration of intravenous contrast. CONTRAST:  131mL OMNIPAQUE IOHEXOL 300 MG/ML  SOLN COMPARISON:  Abdominal ultrasound 07/03/2019 FINDINGS: Lower chest: History of breast cancer with postoperative changes seen inferiorly on the right where there was a previously a measured mass.Atrial septal occluder. Mild atelectatic type opacity at the bases. Hepatobiliary: Hepatic steatosis. No evidence of biliary obstruction or stone. Pancreas: Unremarkable. Spleen: Unremarkable. Adrenals/Urinary Tract: Negative adrenals. No  hydronephrosis or stone. Unremarkable bladder. Stomach/Bowel: No obstruction or visible inflammation. Appendicolith. No appendicitis. Vascular/Lymphatic: No acute vascular abnormality. No mass or adenopathy. Reproductive:Fibroid appearance at the left body and fundus measuring 35 mm. Other: No ascites or pneumoperitoneum. Musculoskeletal: No acute abnormalities. 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 Prior low lumbar surgery. IMPRESSION: 1. No acute finding. 2. Hepatic steatosis. Electronically Signed   By: Monte Fantasia M.D.   On: 09/04/2019 07:57   DG Chest Portable 1 View  Result Date: 09/04/2019 CLINICAL DATA:  Fever and body aches EXAM: PORTABLE CHEST 1 VIEW COMPARISON:  08/04/2019 FINDINGS: Streaky density at the lung bases which is mild. Lung volumes are low. There is no edema, consolidation, effusion, or pneumothorax. Normal heart size and mediastinal contours. 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 IMPRESSION: Low volume chest with mild atelectatic opacity at the bases. Electronically Signed   By: Monte Fantasia M.D.   On: 09/04/2019 06:50        Scheduled Meds: . enoxaparin (LOVENOX) injection  40 mg Subcutaneous Q24H  . letrozole  2.5 mg Oral Daily  . metoprolol tartrate  5 mg Intravenous Q6H  . pantoprazole  40 mg Oral Daily  . PARoxetine  10 mg Oral Daily  . sodium chloride flush  3 mL Intravenous Q12H   Continuous Infusions: . sodium chloride 125 mL/hr at 09/05/19 0600  . sodium chloride    . cefTRIAXone (ROCEPHIN)  IV 2 g (09/04/19 2120)  . metronidazole 500 mg (09/05/19 [id number])     LOS: 1 day   Time spent 42 minutes  Georgette Shell, MD 09/05/2019, 1:05 PM

## 2019-09-05 NOTE — Progress Notes (Signed)
   09/04/19 2047  Vitals  Temp 98.4 F (36.9 C)  Temp Source Oral  BP (!) 155/72  MAP (mmHg) 86  BP Location Right Arm  BP Method Automatic  Patient Position (if appropriate) Lying  Pulse Rate (!) 117  Pulse Rate Source Dinamap  Level of Consciousness  Level of Consciousness Alert  Oxygen Therapy  SpO2 99 %  O2 Device Room Air  MEWS Score  MEWS Temp 0  MEWS Systolic 0  MEWS Pulse 2  MEWS RR 0  MEWS LOC 0  MEWS Score 2  MEWS Score Color Yellow  MEWS Assessment  Is this an acute change? No  Provider Notification  Provider Name/Title M. Sharlet Salina, NP  Date Provider Notified 09/04/19  Time Provider Notified 2106  Notification Type Page  Notification Reason Other (Comment) (To make aware of yellow MEWS and page about patient request.)  Response No new orders (No new orders related to Yellow MEWS.)  Date of Provider Response  (No new orders related to yellow MEWS)  Time of Provider Response  (No new orders related to yellow MEWS)    Paged M. Sharlet Salina, NP to inform about yellow MEWS score. The yellow MEWS score is not an acute change. M. Sharlet Salina was also paged in regards to a patient's request for an ointment cream for the right eye. Ointment cream order put in by M. Sharlet Salina, NP. Patient is alert and oriented. Will continue to monitor patient.

## 2019-09-06 LAB — C DIFFICILE QUICK SCREEN W PCR REFLEX
C Diff antigen: NEGATIVE
C Diff interpretation: NOT DETECTED
C Diff toxin: NEGATIVE

## 2019-09-06 LAB — TSH: TSH: 2.083 u[IU]/mL (ref 0.350–4.500)

## 2019-09-06 LAB — OCCULT BLOOD X 1 CARD TO LAB, STOOL: Fecal Occult Bld: NEGATIVE

## 2019-09-06 MED ORDER — LOPERAMIDE HCL 2 MG PO CAPS
2.0000 mg | ORAL_CAPSULE | Freq: Four times a day (QID) | ORAL | Status: DC | PRN
  Administered 2019-09-06 (×2): 2 mg via ORAL
  Filled 2019-09-06 (×2): qty 1

## 2019-09-06 MED ORDER — ALPRAZOLAM 1 MG PO TABS
1.0000 mg | ORAL_TABLET | Freq: Every day | ORAL | Status: DC
  Administered 2019-09-06 – 2019-09-07 (×2): 1 mg via ORAL
  Filled 2019-09-06 (×2): qty 1

## 2019-09-06 MED ORDER — AMITRIPTYLINE HCL 25 MG PO TABS
25.0000 mg | ORAL_TABLET | Freq: Every day | ORAL | Status: DC
  Administered 2019-09-06 – 2019-09-07 (×2): 25 mg via ORAL
  Filled 2019-09-06 (×2): qty 1

## 2019-09-06 NOTE — Progress Notes (Signed)
PROGRESS NOTE    Brandy [name]  [id number] DOB: 1965/12/26 DOA: 09/04/2019 PCP: [facility] Brief Narrative: 54 year old female with history of recently diagnosed metastatic breast cancer on oral chemo, migraine, GERD, hiatal hernia, ASD status post repair in 2006 admitted with severe abdominal pain nausea vomiting and diarrhea after eating hamburger helper and cookies and Pepsi.  She reported a fever of 103 at home however in the hospital her T-max is 100.1.  She has had no diarrhea since yesterday no vomiting since yesterday and her abdominal pain has resolved. Her main complaint today is severe GERD. She is tachycardic in the low 100s.  Looking back in her chart from previous admission and prior to that she has been tachycardic in the low 100s.  She has never had cardiac work-up in the past.  She does not check her vital signs at home.  Assessment & Plan:   Principal Problem:   Sepsis ([place]) Active Problems:   Anxiety disorder   Insomnia   Breast cancer ([place])   Diarrhea   GERD (gastroesophageal reflux disease)   Hyperlipidemia   CKD (chronic kidney disease), stage III   Depression   Malignant hypertension   Transaminitis   Lactic acidosis   Hepatic steatosis   Nausea & vomiting   Obesity (BMI 30-39.9)    #1  SIRS sepsis ruled out -severe GERD patient admitted with abdominal pain nausea vomiting and diarrhea after eating hamburger helper and drinking Pepsi.  She does not have a history of peptic ulcer disease however she has a history of GERD and hiatal hernia.  Since she has not had any more episodes of diarrhea they were unable to collect her stool for C. Difficile.  She remains afebrile off antibiotics.  Patient has had no further diarrhea since the day of admission.  She has had no vomiting either.  However she remains nauseous. Continue Protonix twice a day with sucralfate and simethicone. Full liquid diet today.   CT scan of the abdomen and pelvis  done in the ER shows no acute findings other than hepatic steatosis. Urine culture multiple species present.  Repeat culture pending.   Blood culture pending  #2 sinus tachycardia likely exacerbated by #1.  Resolved. TSH 2.083 lipase 36 Echocardiogram  function grade 1 diastolic dysfunction ejection fraction 55 to 60%.  No regional wall motion abnormalities.  No evidence of mitral stenosis or aortic stenosis.  #3  Metastatic breast cancer on chemotherapy oral  #4 CKD stage III stable  #5 anxiety disorder/depression continue home meds  #6 hyperlipidemia holding statins due to elevated AST ALT  #7 chronic dysphagia she has had multiple work-up as an outpatient.  #8 hypertension blood pressure 140/65 no history of hypertension prior to admission to hospital likely related to #1.  #9 right eye socket discharge I covered with dressing and spacer removed for the time being.  #10 elevated LFTs alkaline phosphatase 146, AST 120, ALT 108 on admission.  Alkaline phosphatase down to 101, AST 46, ALT 61.  ?  Due to fatty liver  Estimated body mass index is 36.12 kg/m as calculated from the following:   Height as of this encounter: 5\' 7"  (1.702 m).   Weight as of this encounter: 104.6 kg.  DVT prophylaxis: Lovenox Code Status: Full code Family Communication: Discussed with patient disposition Plan: Patient came from home Plan will be to discharge her home Barriers to discharge patient admitted with nausea vomiting and diarrhea has not had any p.o. intake yet  still on clear liquids, advance to full liquids today. Consultants:   None  Procedures: None Antimicrobials:  Subjective: Resting in bed no vomiting or diarrhea however still continues have nausea   Objective: Vitals:   09/05/19 1817 09/05/19 2128 09/06/19 0033 09/06/19 0517  BP: 130/69 138/62 (!) 151/68 (!) 147/62  Pulse: 96 71 95 83  Resp:    17  Temp:  98.4 F (36.9 C)  97.8 F (36.6 C)  TempSrc:  Oral      SpO2:  95% 100% 91%  Weight:      Height:        Intake/Output Summary (Last 24 hours) at 09/06/2019 1216 Last data filed at 09/05/2019 1838 Gross per 24 hour  Intake 1328.98 ml  Output --  Net 1328.98 ml   Filed Weights   09/04/19 1757  Weight: 104.6 kg    Examination: Right eye is covered with a dressing.  General exam: Appears calm and comfortable  Respiratory system: Clear to auscultation. Respiratory effort normal. Cardiovascular system: S1 & S2 heard, RRR. No JVD, murmurs, rubs, gallops or clicks. No pedal edema. Gastrointestinal system: Abdomen is nondistended, soft and nontender. No organomegaly or masses felt. Normal bowel sounds heard. Central nervous system: Alert and oriented. No focal neurological deficits. Extremities: Symmetric 5 x 5 power. Skin: No rashes, lesions or ulcers Psychiatry: Judgement and insight appear normal. Mood & affect appropriate.     Data Reviewed: I have personally reviewed following labs and imaging studies  CBC: Recent Labs  Lab 09/04/19 0309 09/05/19 0437  WBC 5.4 3.1*  NEUTROABS 4.2  --   HGB 12.3 10.6*  HCT 38.4 33.6*  MCV 104.1* 107.3*  PLT 280 [id number]   Basic Metabolic Panel: Recent Labs  Lab 09/04/19 0309 09/05/19 0437  NA 140 137  K 4.0 3.6  CL 104 106  CO2 25 24  GLUCOSE 128* 118*  BUN 15 12  CREATININE 1.27* 1.07*  CALCIUM 9.0 8.0*   GFR: Estimated Creatinine Clearance: 73.9 mL/min (A) (by C-G formula based on SCr of 1.07 mg/dL (H)). Liver Function Tests: Recent Labs  Lab 09/04/19 0309 09/05/19 0437  AST 120* 46*  ALT 108* 61*  ALKPHOS 146* 101  BILITOT 0.9 0.7  PROT 7.9 6.3*  ALBUMIN 3.7 2.8*   Recent Labs  Lab 09/04/19 0309 09/05/19 1517  LIPASE 38 36   No results for input(s): AMMONIA in the last 168 hours. Coagulation Profile: No results for input(s): INR, PROTIME in the last 168 hours. Cardiac Enzymes: No results for input(s): CKTOTAL, CKMB, CKMBINDEX, TROPONINI in the last 168 hours. BNP  (last 3 results) No results for input(s): PROBNP in the last 8760 hours. HbA1C: No results for input(s): HGBA1C in the last 72 hours. CBG: No results for input(s): GLUCAP in the last 168 hours. Lipid Profile: No results for input(s): CHOL, HDL, LDLCALC, TRIG, CHOLHDL, LDLDIRECT in the last 72 hours. Thyroid Function Tests: Recent Labs    09/06/19 0447  TSH 2.083   Anemia Panel: No results for input(s): VITAMINB12, FOLATE, FERRITIN, TIBC, IRON, RETICCTPCT in the last 72 hours. Sepsis Labs: Recent Labs  Lab 09/04/19 0520 09/04/19 0755 09/04/19 1634 09/05/19 0437  PROCALCITON  --   --   --  0.32  LATICACIDVEN 2.0* 1.7 1.7  --     Recent Results (from the past 240 hour(s))  Culture, blood (routine x 2)     Status: None (Preliminary result)   Collection Time: 09/04/19  5:04 AM   Specimen: BLOOD  Result Value Ref Range Status   Specimen Description   Final    BLOOD RIGHT ARM Performed at [facility] [address], [place], [place] [address]    Special Requests   Final    BOTTLES DRAWN AEROBIC AND ANAEROBIC Blood Culture results may not be optimal due to an inadequate volume of blood received in culture bottles Performed at [facility] [address] [place], [place] [address]    Culture   Final    NO GROWTH 1 DAY Performed at [facility], [place] [address] [place], [place] [address]    Report Status PENDING  Incomplete  Culture, blood (routine x 2)     Status: None (Preliminary result)   Collection Time: 09/04/19  5:20 AM   Specimen: BLOOD  Result Value Ref Range Status   Specimen Description   Final    BLOOD BLOOD LEFT FOREARM Performed at [facility] [address], [place], [place] [address]    Special Requests   Final    BOTTLES DRAWN AEROBIC AND ANAEROBIC Blood Culture adequate volume Performed at [facility] [address], [place], [place] [address]    Culture   Final      NO GROWTH 1 DAY Performed at [facility], [place] [address], [place], [place] [address]    Report Status PENDING  Incomplete  SARS CORONAVIRUS 2 (TAT 6-24 HRS) Nasopharyngeal Nasopharyngeal Swab     Status: None   Collection Time: 09/04/19  5:20 AM   Specimen: Nasopharyngeal Swab  Result Value Ref Range Status   SARS Coronavirus 2 NEGATIVE NEGATIVE Final    Comment: (NOTE) SARS-CoV-2 target nucleic acids are NOT DETECTED. The SARS-CoV-2 RNA is generally detectable in upper and lower respiratory specimens during the acute phase of infection. Negative results do not preclude SARS-CoV-2 infection, do not rule out co-infections with other pathogens, and should not be used as the sole basis for treatment or other patient management decisions. Negative results must be combined with clinical observations, patient history, and epidemiological information. The expected result is Negative. Fact Sheet for Patients: SugarRoll.be Fact Sheet for Healthcare Providers: https://www.[facility] [place].com/ This test is not yet approved or cleared by the [place] FDA and  has been authorized for detection and/or diagnosis of SARS-CoV-2 by FDA under an Emergency Use Authorization (EUA). This EUA will remain  in effect (meaning this test can be used) for the duration of the COVID-19 declaration under Section 56 4(b)(1) of the Act, 21 U.S.C. section 360bbb-3(b)(1), unless the authorization is terminated or revoked sooner. Performed at [facility], [place] [address], [place], [place] [address]   Urine culture     Status: Abnormal   Collection Time: 09/04/19  9:53 AM   Specimen: Urine, Clean Catch  Result Value Ref Range Status   Specimen Description   Final    URINE, CLEAN CATCH Performed at [facility], [place] [address], [place], [place] [address]    Special Requests   Final    NONE Performed at [facility],  [place] [address], [place],  [address]    Culture MULTIPLE SPECIES PRESENT, SUGGEST RECOLLECTION (A)  Final   Report Status 09/05/2019 FINAL  Final         Radiology Studies: ECHOCARDIOGRAM COMPLETE  Result Date: 09/05/2019    ECHOCARDIOGRAM REPORT   Patient Name:   CONSWELLA GERATY Date of Exam: 09/05/2019 Medical Rec #:  [id number]  Height:       67.0 in Accession #:    [id number]        Weight:       230.6 lb Date of Birth:  April 09, 1966         BSA:          2.148 m Patient Age:    31 years          BP:           137/70 mmHg Patient Gender: F                 HR:           75 bpm. Exam Location:  Inpatient Procedure: 2D Echo, Cardiac Doppler and Color Doppler Indications:    I50.33 Acute on chronic diastolic (congestive) heart failure  History:        Patient has no prior history of Echocardiogram examinations.                 Signs/Symptoms:Murmur; Risk Factors:GERD. ASD Closure.  Sonographer:    Jonelle Sidle Dance Referring Phys: [place]  1. Technically difficult; normal LV function; grade 1 diastolic dysfunction; mild MR; s/p ASD closure; possible density projecting from device on apical views; suggest TEE to further assess.  2. Left ventricular ejection fraction, by estimation, is 55 to 60%. The left ventricle has normal function. The left ventricle has no regional wall motion abnormalities. Left ventricular diastolic parameters are consistent with Grade I diastolic dysfunction (impaired relaxation). Elevated left atrial pressure.  3. Right ventricular systolic function is normal. The right ventricular size is normal.  4. The mitral valve is normal in structure. Mild mitral valve regurgitation. No evidence of mitral stenosis.  5. The aortic valve is normal in structure. Aortic valve regurgitation is not visualized. No aortic stenosis is present. FINDINGS  Left Ventricle: Left ventricular ejection fraction, by estimation, is 55 to 60%. The left ventricle has normal  function. The left ventricle has no regional wall motion abnormalities. The left ventricular internal cavity size was normal in size. There is  no left ventricular hypertrophy. Left ventricular diastolic parameters are consistent with Grade I diastolic dysfunction (impaired relaxation). Elevated left atrial pressure. Right Ventricle: The right ventricular size is normal. Right ventricular systolic function is normal. Left Atrium: Left atrial size was normal in size. Right Atrium: Right atrial size was normal in size. Pericardium: There is no evidence of pericardial effusion. Mitral Valve: The mitral valve is normal in structure. Normal mobility of the mitral valve leaflets. Mild mitral valve regurgitation. No evidence of mitral valve stenosis. Tricuspid Valve: The tricuspid valve is normal in structure. Tricuspid valve regurgitation is mild . No evidence of tricuspid stenosis. Aortic Valve: The aortic valve is normal in structure. Aortic valve regurgitation is not visualized. No aortic stenosis is present. Pulmonic Valve: The pulmonic valve was normal in structure. Pulmonic valve regurgitation is trivial. No evidence of pulmonic stenosis. Aorta: The aortic root is normal in size and structure. Venous: The inferior vena cava was not well visualized. IAS/Shunts: No atrial level shunt detected by color flow Doppler. Additional Comments: Technically difficult; normal LV function; grade 1 diastolic dysfunction; mild MR; s/p ASD closure; possible density projecting from device on apical views; suggest TEE to further assess.  LEFT VENTRICLE PLAX 2D LVIDd:         4.60 cm  Diastology LVIDs:         3.20 cm  LV e' lateral:  6.74 cm/s LV PW:         0.80 cm  LV E/e' lateral: 16.3 LV IVS:        1.00 cm  LV e' medial:    6.42 cm/s LVOT diam:     1.70 cm  LV E/e' medial:  17.1 LV SV:         69 LV SV Index:   32 LVOT Area:     2.27 cm  RIGHT VENTRICLE             IVC RV Basal diam:  2.70 cm     IVC diam: 1.30 cm RV S prime:      10.60 cm/s TAPSE (M-mode): 1.8 cm LEFT ATRIUM             Index       RIGHT ATRIUM           Index LA diam:        2.80 cm 1.30 cm/m  RA Area:     11.40 cm LA Vol (A2C):   47.0 ml 21.88 ml/m RA Volume:   23.00 ml  10.71 ml/m LA Vol (A4C):   50.3 ml 23.41 ml/m LA Biplane Vol: 51.6 ml 24.02 ml/m  AORTIC VALVE LVOT Vmax:   129.00 cm/s LVOT Vmean:  97.300 cm/s LVOT VTI:    0.303 m  AORTA Ao Root diam: 3.30 cm Ao Asc diam:  2.50 cm MITRAL VALVE                TRICUSPID VALVE MV Area (PHT): 3.37 cm     TR Peak grad:   22.1 mmHg MV Decel Time: 225 msec     TR Vmax:        235.00 cm/s MV E velocity: 110.00 cm/s MV A velocity: 114.00 cm/s  SHUNTS MV E/A ratio:  0.96         Systemic VTI:  0.30 m                             Systemic Diam: 1.70 cm Kirk Ruths MD Electronically signed by Kirk Ruths MD Signature Date/Time: 09/05/2019/3:53:46 PM    Final         Scheduled Meds:  enoxaparin (LOVENOX) injection  40 mg Subcutaneous Q24H   letrozole  2.5 mg Oral Daily   metoprolol tartrate  5 mg Intravenous Q6H   pantoprazole  40 mg Oral BID   PARoxetine  10 mg Oral Daily   scopolamine  1 patch Transdermal Q72H   sodium chloride flush  3 mL Intravenous Q12H   sucralfate  1 g Oral TID WC & HS   Continuous Infusions:  sodium chloride 125 mL/hr at 09/05/19 0600   sodium chloride 125 mL/hr at 09/06/19 0606     LOS: 2 days     Georgette Shell, MD 09/06/2019, 12:16 PM

## 2019-09-06 NOTE — Progress Notes (Signed)
Pt requested to have fluids paused overnight.   Order d/c'd for NS @ 125 cc/hr. Please resume in AM if needed.  Patient reportedly taking in PO well.

## 2019-09-06 NOTE — Evaluation (Signed)
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 CT negative. dx with severe GERD  Clinical Impression  Patient evaluated by Physical Therapy with no further acute PT needs identified. All education has been completed and the patient has no further questions.  Pt is independent with mobility. No LOB with higher level balance activities. Pt ok to amb on unit from PT standpoint.   See below for any follow-up Physical Therapy or equipment needs. PT is signing off. Thank you for this referral.     Follow Up Recommendations No PT follow up    Equipment Recommendations  None recommended by PT    Recommendations for Other Services       Precautions / Restrictions Precautions Precautions: None Restrictions Weight Bearing Restrictions: No      Mobility  Bed Mobility Overal bed mobility: Independent                Transfers Overall transfer level: Independent                  Ambulation/Gait Ambulation/Gait assistance: Independent Gait Distance (Feet): 300 Feet         General Gait Details: hallway amb and amb in tighter spaces of room and bathroom  Stairs            Wheelchair Mobility    Modified Rankin (Stroke Patients Only)       Balance Overall balance assessment: Needs assistance   Sitting balance-Leahy Scale: Normal       Standing balance-Leahy Scale: Good Standing balance comment: good to normal             High level balance activites: Side stepping;Backward walking;Direction changes;Turns;Head turns;Sudden stops High Level Balance Comments: no  LOB with above, or 360 degree turns, picking up objects             Pertinent Vitals/Pain Pain Assessment: No/denies pain    Home Living Family/patient expects to be discharged to:: Private residence Living Arrangements: Spouse/significant other Available Help at Discharge: Family Type of Home: House Home Access: Stairs to enter   Technical brewer of Steps: 5 Home Layout: Two level;Able to live on main level with bedroom/bathroom Home Equipment: Gilford Rile - 2 wheels;Bedside commode      Prior Function Level of Independence: Independent         Comments: pt is a Multimedia programmer [employer]        Extremity/Trunk Assessment   Upper Extremity Assessment Upper Extremity Assessment: Overall WFL for tasks assessed    Lower Extremity Assessment Lower Extremity Assessment: Overall WFL for tasks assessed       Communication   Communication: No difficulties  Cognition Arousal/Alertness: Awake/alert Behavior During Therapy: WFL for tasks assessed/performed Overall Cognitive Status: Within Functional Limits for tasks assessed                                        General Comments      Exercises     Assessment/Plan    PT Assessment Patent does not need any further PT  services  PT Problem List         PT Treatment Interventions      PT Goals (Current goals can be found in the Care Plan section)  Acute Rehab PT Goals Patient Stated Goal: home tomorrow PT Goal Formulation: All assessment and education complete, DC therapy    Frequency     Barriers to discharge        Co-evaluation               AM-PAC PT "6 Clicks" Mobility  Outcome Measure Help needed turning from your back to your side while in a flat bed without using bedrails?: None Help needed moving from lying on your back to sitting on the side of a flat bed without using bedrails?: None Help needed moving to and from a bed to a chair (including a wheelchair)?:  None Help needed standing up from a chair using your arms (e.g., wheelchair or bedside chair)?: None Help needed to walk in hospital room?: None Help needed climbing 3-5 steps with a railing? : None 6 Click Score: 24    End of Session   Activity Tolerance: Patient tolerated treatment well Patient left: in bed;with call bell/phone within reach;with family/visitor present Nurse Communication: Mobility status PT Visit Diagnosis: Difficulty in walking, not elsewhere classified (R26.2)    Time: [id number] PT Time Calculation (min) (ACUTE ONLY): 22 min   Charges:   PT Evaluation $PT Eval Low Complexity: [place], PT   Acute Rehab Dept [facility]): [id number]   09/06/2019   [facility] 09/06/2019, 11:28 AM

## 2019-09-07 ENCOUNTER — Inpatient Hospital Stay (HOSPITAL_COMMUNITY): Payer: BC Managed Care – PPO

## 2019-09-07 LAB — COMPREHENSIVE METABOLIC PANEL
ALT: 52 U/L — ABNORMAL HIGH (ref 0–44)
AST: 46 U/L — ABNORMAL HIGH (ref 15–41)
Albumin: 3.1 g/dL — ABNORMAL LOW (ref 3.5–5.0)
Alkaline Phosphatase: 118 U/L (ref 38–126)
Anion gap: 10 (ref 5–15)
BUN: 6 mg/dL (ref 6–20)
CO2: 28 mmol/L (ref 22–32)
Calcium: 8.5 mg/dL — ABNORMAL LOW (ref 8.9–10.3)
Chloride: 104 mmol/L (ref 98–111)
Creatinine, Ser: 1.01 mg/dL — ABNORMAL HIGH (ref 0.44–1.00)
GFR calc Af Amer: >60 mL/min (ref >60)
GFR calc non Af Amer: >60 mL/min (ref >60)
Glucose, Bld: 104 mg/dL — ABNORMAL HIGH (ref 70–99)
Potassium: 2.9 mmol/L — ABNORMAL LOW (ref 3.5–5.1)
Sodium: 142 mmol/L (ref 135–145)
Total Bilirubin: 0.6 mg/dL (ref 0.3–1.2)
Total Protein: 6.7 g/dL (ref 6.5–8.1)

## 2019-09-07 LAB — CBC
HCT: 34.1 % — ABNORMAL LOW (ref 36.0–46.0)
Hemoglobin: 11 g/dL — ABNORMAL LOW (ref 12.0–15.0)
MCH: 33.1 pg (ref 26.0–34.0)
MCHC: 32.3 g/dL (ref 30.0–36.0)
MCV: 102.7 fL — ABNORMAL HIGH (ref 80.0–100.0)
Platelets: 252 10*3/uL (ref 150–400)
RBC: 3.32 MIL/uL — ABNORMAL LOW (ref 3.87–5.11)
RDW: 13.8 % (ref 11.5–15.5)
WBC: 6.1 10*3/uL (ref 4.0–10.5)
nRBC: 0 % (ref 0.0–0.2)

## 2019-09-07 LAB — MAGNESIUM: Magnesium: 1.2 mg/dL — ABNORMAL LOW (ref 1.7–2.4)

## 2019-09-07 LAB — URINE CULTURE: Culture: NO GROWTH

## 2019-09-07 MED ORDER — MAGNESIUM SULFATE 2 GM/50ML IV SOLN
2.0000 g | Freq: Once | INTRAVENOUS | Status: AC
  Administered 2019-09-07: 2 g via INTRAVENOUS
  Filled 2019-09-07: qty 50

## 2019-09-07 MED ORDER — ALBUTEROL SULFATE (2.5 MG/3ML) 0.083% IN NEBU: 2.5000 mg | INHALATION_SOLUTION | RESPIRATORY_TRACT | Status: DC | PRN

## 2019-09-07 MED ORDER — METOPROLOL TARTRATE 25 MG PO TABS
12.5000 mg | ORAL_TABLET | Freq: Two times a day (BID) | ORAL | Status: DC
  Administered 2019-09-07 – 2019-09-08 (×3): 12.5 mg via ORAL
  Filled 2019-09-07 (×3): qty 1

## 2019-09-07 MED ORDER — ALBUTEROL SULFATE (2.5 MG/3ML) 0.083% IN NEBU
2.5000 mg | INHALATION_SOLUTION | Freq: Four times a day (QID) | RESPIRATORY_TRACT | Status: DC
  Administered 2019-09-07: 2.5 mg via RESPIRATORY_TRACT
  Filled 2019-09-07: qty 3

## 2019-09-07 MED ORDER — POTASSIUM CHLORIDE CRYS ER 20 MEQ PO TBCR
40.0000 meq | EXTENDED_RELEASE_TABLET | ORAL | Status: AC
  Administered 2019-09-07: 40 meq via ORAL
  Filled 2019-09-07: qty 2

## 2019-09-07 MED ORDER — FUROSEMIDE 10 MG/ML IJ SOLN
40.0000 mg | Freq: Once | INTRAMUSCULAR | Status: AC
  Administered 2019-09-07: 40 mg via INTRAVENOUS
  Filled 2019-09-07: qty 4

## 2019-09-07 NOTE — Progress Notes (Addendum)
PROGRESS NOTE    Brandy [name]  [id number] DOB: 08/30/1965 DOA: 09/04/2019 PCP: [facility]  Brief Narrative:54 year old female with history ofrecently diagnosed metastaticbreast cancer on oral chemo,migraine, GERD, hiatal hernia, ASD status post repair in 2006 admitted with severe abdominal pain nausea vomiting and diarrhea after eating hamburger helper and cookies and Pepsi. She reported a fever of 103 at home however in the hospital her T-max is 100.1. She has had no diarrhea since yesterday no vomiting since yesterday and her abdominal pain has resolved. Her main complaint today is severe GERD. She is tachycardic in the low 100s. Looking back in her chart from previous admission and prior to that she has been tachycardic in the low 100s. She has never had cardiac work-up in the past. She does not check her vital signs at home.  09/07/2019-patient spiked a temp of 101 .2 overnight.  She had few bouts of diarrhea for which Imodium was given.  Stool C. difficile negative. Gi pathogen panel is pending.  She reports occasional on and off diarrhea at home.    Assessment & Plan:   Principal Problem:   Sepsis ([place]) Active Problems:   Anxiety disorder   Insomnia   Breast cancer ([place])   Diarrhea   GERD (gastroesophageal reflux disease)   Hyperlipidemia   CKD (chronic kidney disease), stage III   Depression   Malignant hypertension   Transaminitis   Lactic acidosis   Hepatic steatosis   Nausea & vomiting   Obesity (BMI 30-39.9)   #1  SIRS sepsis ruled out -patient admitted with nausea vomiting diarrhea and abdominal pain.  Meeting has resolved.  She continues to have some diarrhea.  Stool C. difficile is negative.  GI pathogen panel is pending.   Blood cultures no growth to date.   Urine culture 09/04/2019 with mixed growth repeat urine culture done results pending.   Patient was initially given antibiotics which was stopped as there were no evidence or  source of infection identified. Patient remained on full liquid diet yesterday we will advance diet to soft diet today. She spiked a temp last night so we will have to monitor her and follow-up her cultures prior to discharge. CT of the abdomen and pelvis done in the ER shows no acute findings other than hepatic steatosis. Lipase 36  Addendum 11:15 AM I was informed by the nurse the patient has a temp of 100.1.  With saturation 89% on room air.  She was placed on half a liter of oxygen.  Sats 97%.  I have ordered a chest x-ray-fluid overload noted.ivf stopped yesterday.lasix one dose given.  #2 severe GERD continue PPI.  #3 sinus tachycardia likely exacerbated by #1.  Resolved. TSH 2.083  Echocardiogram function grade 1 diastolic dysfunction ejection fraction 55 to 60%.  No regional wall motion abnormalities.  No evidence of mitral stenosis or aortic stenosis.  #4Metastatic breast cancer on chemotherapy oral  #5CKD stage III stable  #6 anxiety disorder/depression continue home meds Xanax 1 mg nightly with Elavil 25 mg nightly.  #7 hyperlipidemia holding statins due to elevated AST ALT  #8 chronic dysphagia she has had multiple work-up as an outpatient.  #9 hypertension -blood pressure remains high 157/69.  She is on as needed Lopressor.  She was not on any antihypertensives prior to admission.  I have started her on Lopressor 12.5 twice a day.  Follow-up closely.   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  Followed by an ophthalmologist.  She had a spacer put in to replace the previous spacer which was too small.  However the spacer is too big and irritating her eyes.  Patient is waiting for insurance approval for her prosthetic eye.  It appears that the current spacer is too big and is irritating her right eye.  Have advised the patient to remove the spacer and keep the area clean with triple antibiotic cream on the eye.    #11 elevated  LFTs-trending down continue to hold statin.  I have changed her from Vicodin to oxycodone.  #12 hypokalemia with hypomagnesemia K is 2.9 with magnesium 1.2 being repleted follow-up in a.m.      Estimated body mass index is 36.12 kg/m as calculated from the following:   Height as of this encounter: 5\' 7"  (1.702 m).   Weight as of this encounter: 104.6 kg.  DVT prophylaxis:Lovenox Code Status:Full code Family Communication:Discussed with patient   disposition Plan:Patient came from home Plan will be to discharge her home Barriers to discharge patient admitted with nausea vomiting and diarrhea has not had any p.o. intake yet still on clear liquids, advance to full liquids today. Consultants:  None  Procedures:None Antimicrobials:  Subjective: She is resting in bed she feels okay nausea better had some diarrhea which is getting better she was anxious to go home however she spiked a temp last night so she is disappointed she cannot go home.  Objective: Vitals:   09/06/19 1305 09/06/19 2105 09/07/19 0458 09/07/19 0656  BP: (!) 158/79 (!) 152/67 (!) 146/72 (!) 157/69  Pulse:  93 (!) 106 96  Resp:  16 18 18   Temp: 98.4 F (36.9 C) 98.3 F (36.8 C) (!) 101.1 F (38.4 C) (!) 100.8 F (38.2 C)  TempSrc: Oral Oral Oral Oral  SpO2: 96% 96% 91% 93%  Weight:      Height:       No intake or output data in the 24 hours ending 09/07/19 0859 Filed Weights   09/04/19 1757  Weight: 104.6 kg    Examination: Right thigh covered with dressing. General exam: Appears calm and comfortable  Respiratory system: Clear to auscultation. Respiratory effort normal. Cardiovascular system: S1 & S2 heard, RRR. No JVD, murmurs, rubs, gallops or clicks. No pedal edema. Gastrointestinal system: Abdomen is nondistended, soft and nontender. No organomegaly or masses felt. Normal bowel sounds heard. Central nervous system: Alert and oriented. No focal neurological deficits. Extremities:  Symmetric 5 x 5 power. Skin: No rashes, lesions or ulcers Psychiatry: Judgement and insight appear normal. Mood & affect appropriate.     Data Reviewed: I have personally reviewed following labs and imaging studies  CBC: Recent Labs  Lab 09/04/19 0309 09/05/19 0437 09/07/19 0501  WBC 5.4 3.1* 6.1  NEUTROABS 4.2  --   --   HGB 12.3 10.6* 11.0*  HCT 38.4 33.6* 34.1*  MCV 104.1* 107.3* 102.7*  PLT 280 200 [id number]   Basic Metabolic Panel: Recent Labs  Lab 09/04/19 0309 09/05/19 0437 09/07/19 0501  NA 140 137 142  K 4.0 3.6 2.9*  CL 104 106 104  CO2 25 24 28   GLUCOSE 128* 118* 104*  BUN 15 12 6   CREATININE 1.27* 1.07* 1.01*  CALCIUM 9.0 8.0* 8.5*  MG  --   --  1.2*   GFR: Estimated Creatinine Clearance: 78.3 mL/min (A) (by C-G formula based on SCr of 1.01 mg/dL (H)). Liver Function Tests: Recent Labs  Lab 09/04/19 0309 09/05/19 0437 09/07/19  0501  AST 120* 46* 46*  ALT 108* 61* 52*  ALKPHOS 146* 101 118  BILITOT 0.9 0.7 0.6  PROT 7.9 6.3* 6.7  ALBUMIN 3.7 2.8* 3.1*   Recent Labs  Lab 09/04/19 0309 09/05/19 1517  LIPASE 38 36   No results for input(s): AMMONIA in the last 168 hours. Coagulation Profile: No results for input(s): INR, PROTIME in the last 168 hours. Cardiac Enzymes: No results for input(s): CKTOTAL, CKMB, CKMBINDEX, TROPONINI in the last 168 hours. BNP (last 3 results) No results for input(s): PROBNP in the last 8760 hours. HbA1C: No results for input(s): HGBA1C in the last 72 hours. CBG: No results for input(s): GLUCAP in the last 168 hours. Lipid Profile: No results for input(s): CHOL, HDL, LDLCALC, TRIG, CHOLHDL, LDLDIRECT in the last 72 hours. Thyroid Function Tests: Recent Labs    09/06/19 0447  TSH 2.083   Anemia Panel: No results for input(s): VITAMINB12, FOLATE, FERRITIN, TIBC, IRON, RETICCTPCT in the last 72 hours. Sepsis Labs: Recent Labs  Lab 09/04/19 0520 09/04/19 0755 09/04/19 1634 09/05/19 0437  PROCALCITON  --    --   --  0.32  LATICACIDVEN 2.0* 1.7 1.7  --     Recent Results (from the past 240 hour(s))  Culture, blood (routine x 2)     Status: None (Preliminary result)   Collection Time: 09/04/19  5:04 AM   Specimen: BLOOD  Result Value Ref Range Status   Specimen Description   Final    BLOOD RIGHT ARM Performed at [facility] [address] [place], [place] [address]    Special Requests   Final    BOTTLES DRAWN AEROBIC AND ANAEROBIC Blood Culture results may not be optimal due to an inadequate volume of blood received in culture bottles Performed at [facility] [address] [place], [place] [address]    Culture   Final    NO GROWTH 2 DAYS Performed at [facility] [address] [place], [place] [address]    Report Status PENDING  Incomplete  Culture, blood (routine x 2)     Status: None (Preliminary result)   Collection Time: 09/04/19  5:20 AM   Specimen: BLOOD  Result Value Ref Range Status   Specimen Description   Final    BLOOD BLOOD LEFT FOREARM Performed at [facility] [address], [place], [place] [address]    Special Requests   Final    BOTTLES DRAWN AEROBIC AND ANAEROBIC Blood Culture adequate volume Performed at [facility] [address] [place], [place] [address]    Culture   Final    NO GROWTH 2 DAYS Performed at [facility] [address], [place], [place] [address]    Report Status PENDING  Incomplete  SARS CORONAVIRUS 2 (TAT 6-24 HRS) Nasopharyngeal Nasopharyngeal Swab     Status: None   Collection Time: 09/04/19  5:20 AM   Specimen: Nasopharyngeal Swab  Result Value Ref Range Status   SARS Coronavirus 2 NEGATIVE NEGATIVE Final    Comment: (NOTE) SARS-CoV-2 target nucleic acids are NOT DETECTED. The SARS-CoV-2 RNA is generally detectable in upper and lower respiratory specimens during the acute phase of infection. Negative results do not preclude  SARS-CoV-2 infection, do not rule out co-infections with other pathogens, and should not be used as the sole basis for treatment or other patient management decisions. Negative results must be combined with clinical observations, patient history, and epidemiological information.  The expected result is Negative. Fact Sheet for Patients: SugarRoll.be Fact Sheet for Healthcare Providers: https://www.woods-J[employer] Cleckler.com/ This test is not yet approved or cleared by the [place] FDA and  has been authorized for detection and/or diagnosis of SARS-CoV-2 by FDA under an Emergency Use Authorization (EUA). This EUA will remain  in effect (meaning this test can be used) for the duration of the COVID-19 declaration under Section 56 4(b)(1) of the Act, 21 U.S.C. section 360bbb-3(b)(1), unless the authorization is terminated or revoked sooner. Performed at [facility], [place] [address], [place], [place] [address]   Urine culture     Status: Abnormal   Collection Time: 09/04/19  9:53 AM   Specimen: Urine, Clean Catch  Result Value Ref Range Status   Specimen Description   Final    URINE, CLEAN CATCH Performed at [facility], [place] [address], [place], [place] [address]    Special Requests   Final    NONE Performed at [facility], [place] [address] [place], [place] [address]    Culture MULTIPLE SPECIES PRESENT, SUGGEST RECOLLECTION (A)  Final   Report Status 09/05/2019 FINAL  Final  C Difficile Quick Screen w PCR reflex     Status: None   Collection Time: 09/06/19  2:38 PM   Specimen: STOOL  Result Value Ref Range Status   C Diff antigen NEGATIVE NEGATIVE Final   C Diff toxin NEGATIVE NEGATIVE Final   C Diff interpretation No C. difficile detected.  Final    Comment: Performed at [facility], [place] [address] [place], [place] [address]         Radiology Studies: ECHOCARDIOGRAM  COMPLETE  Result Date: 09/05/2019    ECHOCARDIOGRAM REPORT   Patient Name:   ROBY MCKINZEY Date of Exam: 09/05/2019 Medical Rec #:  [id number]         Height:       67.0 in Accession #:    [id number]        Weight:       230.6 lb Date of Birth:  12-07-1965         BSA:          2.148 m Patient Age:    30 years          BP:           137/70 mmHg Patient Gender: F                 HR:           75 bpm. Exam Location:  Inpatient Procedure: 2D Echo, Cardiac Doppler and Color Doppler Indications:    I50.33 Acute on chronic diastolic (congestive) heart failure  History:        Patient has no prior history of Echocardiogram examinations.                 Signs/Symptoms:Murmur; Risk Factors:GERD. ASD Closure.  Sonographer:    Jonelle Sidle Dance Referring Phys: [place]  1. Technically difficult; normal LV function; grade 1 diastolic dysfunction; mild MR; s/p ASD closure; possible density projecting from device on apical views; suggest TEE to further assess.  2. Left ventricular ejection fraction, by estimation, is 55 to 60%. The left ventricle has normal function. The left ventricle has no regional wall motion abnormalities. Left ventricular diastolic parameters are consistent with Grade I diastolic dysfunction (impaired relaxation). Elevated left atrial pressure.  3. Right ventricular systolic function is normal. The right  ventricular size is normal.  4. The mitral valve is normal in structure. Mild mitral valve regurgitation. No evidence of mitral stenosis.  5. The aortic valve is normal in structure. Aortic valve regurgitation is not visualized. No aortic stenosis is present. FINDINGS  Left Ventricle: Left ventricular ejection fraction, by estimation, is 55 to 60%. The left ventricle has normal function. The left ventricle has no regional wall motion abnormalities. The left ventricular internal cavity size was normal in size. There is  no left ventricular hypertrophy. Left ventricular diastolic  parameters are consistent with Grade I diastolic dysfunction (impaired relaxation). Elevated left atrial pressure. Right Ventricle: The right ventricular size is normal. Right ventricular systolic function is normal. Left Atrium: Left atrial size was normal in size. Right Atrium: Right atrial size was normal in size. Pericardium: There is no evidence of pericardial effusion. Mitral Valve: The mitral valve is normal in structure. Normal mobility of the mitral valve leaflets. Mild mitral valve regurgitation. No evidence of mitral valve stenosis. Tricuspid Valve: The tricuspid valve is normal in structure. Tricuspid valve regurgitation is mild . No evidence of tricuspid stenosis. Aortic Valve: The aortic valve is normal in structure. Aortic valve regurgitation is not visualized. No aortic stenosis is present. Pulmonic Valve: The pulmonic valve was normal in structure. Pulmonic valve regurgitation is trivial. No evidence of pulmonic stenosis. Aorta: The aortic root is normal in size and structure. Venous: The inferior vena cava was not well visualized. IAS/Shunts: No atrial level shunt detected by color flow Doppler. Additional Comments: Technically difficult; normal LV function; grade 1 diastolic dysfunction; mild MR; s/p ASD closure; possible density projecting from device on apical views; suggest TEE to further assess.  LEFT VENTRICLE PLAX 2D LVIDd:         4.60 cm  Diastology LVIDs:         3.20 cm  LV e' lateral:   6.74 cm/s LV PW:         0.80 cm  LV E/e' lateral: 16.3 LV IVS:        1.00 cm  LV e' medial:    6.42 cm/s LVOT diam:     1.70 cm  LV E/e' medial:  17.1 LV SV:         69 LV SV Index:   32 LVOT Area:     2.27 cm  RIGHT VENTRICLE             IVC RV Basal diam:  2.70 cm     IVC diam: 1.30 cm RV S prime:     10.60 cm/s TAPSE (M-mode): 1.8 cm LEFT ATRIUM             Index       RIGHT ATRIUM           Index LA diam:        2.80 cm 1.30 cm/m  RA Area:     11.40 cm LA Vol (A2C):   47.0 ml 21.88 ml/m RA  Volume:   23.00 ml  10.71 ml/m LA Vol (A4C):   50.3 ml 23.41 ml/m LA Biplane Vol: 51.6 ml 24.02 ml/m  AORTIC VALVE LVOT Vmax:   129.00 cm/s LVOT Vmean:  97.300 cm/s LVOT VTI:    0.303 m  AORTA Ao Root diam: 3.30 cm Ao Asc diam:  2.50 cm MITRAL VALVE                TRICUSPID VALVE MV Area (PHT): 3.37 cm     TR Peak  grad:   22.1 mmHg MV Decel Time: 225 msec     TR Vmax:        235.00 cm/s MV E velocity: 110.00 cm/s MV A velocity: 114.00 cm/s  SHUNTS MV E/A ratio:  0.96         Systemic VTI:  0.30 m                             Systemic Diam: 1.70 cm Kirk Ruths MD Electronically signed by Kirk Ruths MD Signature Date/Time: 09/05/2019/3:53:46 PM    Final         Scheduled Meds: . ALPRAZolam  1 mg Oral QHS  . amitriptyline  25 mg Oral QHS  . enoxaparin (LOVENOX) injection  40 mg Subcutaneous Q24H  . letrozole  2.5 mg Oral Daily  . metoprolol tartrate  5 mg Intravenous Q6H  . pantoprazole  40 mg Oral BID  . PARoxetine  10 mg Oral Daily  . potassium chloride  40 mEq Oral Q2H  . scopolamine  1 patch Transdermal Q72H  . sodium chloride flush  3 mL Intravenous Q12H  . sucralfate  1 g Oral TID WC & HS   Continuous Infusions:   LOS: 3 days   Time spent 41 minutes  Georgette Shell, MD  09/07/2019, 8:59 AM

## 2019-09-08 LAB — CBC
HCT: 36.2 % (ref 36.0–46.0)
Hemoglobin: 11.7 g/dL — ABNORMAL LOW (ref 12.0–15.0)
MCH: 33.6 pg (ref 26.0–34.0)
MCHC: 32.3 g/dL (ref 30.0–36.0)
MCV: 104 fL — ABNORMAL HIGH (ref 80.0–100.0)
Platelets: 261 10*3/uL (ref 150–400)
RBC: 3.48 MIL/uL — ABNORMAL LOW (ref 3.87–5.11)
RDW: 14.1 % (ref 11.5–15.5)
WBC: 5.1 10*3/uL (ref 4.0–10.5)
nRBC: 0 % (ref 0.0–0.2)

## 2019-09-08 LAB — COMPREHENSIVE METABOLIC PANEL
ALT: 45 U/L — ABNORMAL HIGH (ref 0–44)
AST: 34 U/L (ref 15–41)
Albumin: 3.1 g/dL — ABNORMAL LOW (ref 3.5–5.0)
Alkaline Phosphatase: 115 U/L (ref 38–126)
Anion gap: 11 (ref 5–15)
BUN: 12 mg/dL (ref 6–20)
CO2: 28 mmol/L (ref 22–32)
Calcium: 8.5 mg/dL — ABNORMAL LOW (ref 8.9–10.3)
Chloride: 100 mmol/L (ref 98–111)
Creatinine, Ser: 1.19 mg/dL — ABNORMAL HIGH (ref 0.44–1.00)
GFR calc Af Amer: 60 mL/min — ABNORMAL LOW (ref >60)
GFR calc non Af Amer: 51 mL/min — ABNORMAL LOW (ref >60)
Glucose, Bld: 109 mg/dL — ABNORMAL HIGH (ref 70–99)
Potassium: 2.9 mmol/L — ABNORMAL LOW (ref 3.5–5.1)
Sodium: 139 mmol/L (ref 135–145)
Total Bilirubin: 0.7 mg/dL (ref 0.3–1.2)
Total Protein: 6.9 g/dL (ref 6.5–8.1)

## 2019-09-08 LAB — MAGNESIUM: Magnesium: 1.5 mg/dL — ABNORMAL LOW (ref 1.7–2.4)

## 2019-09-08 MED ORDER — POTASSIUM CHLORIDE 20 MEQ PO PACK
40.0000 meq | PACK | ORAL | Status: DC
  Administered 2019-09-08: 40 meq via ORAL
  Filled 2019-09-08: qty 2

## 2019-09-08 MED ORDER — AMITRIPTYLINE HCL 25 MG PO TABS: 75.0000 mg | ORAL_TABLET | Freq: Every day | ORAL | 0 refills | Status: DC

## 2019-09-08 NOTE — Progress Notes (Signed)
Pt discharged from the unit via wheelchair. Discharge instructions were reviewed with pt at bedside. Informed pt to f/u later in week to recheck K levels. No questions or concerns at this time.

## 2019-09-08 NOTE — Discharge Summary (Signed)
Physician Discharge Summary  [place] [id number] DOB: [birth year] DOA: 09/04/2019  PCP: [facility]  Admit date: 09/04/2019 Discharge date: 09/08/2019  Admitted From: Home Disposition: Home  Recommendations for Outpatient Follow-up:  1. Follow up with PCP in 1-2 weeks 2. Please obtain BMP/CBC in one week  Discharge Condition: Stable CODE STATUS: Full Diet recommendation: As tolerated  Brief/Interim Summary: 54 year old female with history ofrecently diagnosed metastaticbreast cancer on oral chemo,migraine, GERD, hiatal hernia, ASD status post repair in 2006 admitted with severe abdominal pain nausea vomiting and diarrhea after eating hamburger helper and cookies and Pepsi. She reported a fever of 103 at home however in the hospital her T-max is 100.1. She has had no diarrhea since yesterday no vomiting since yesterday and her abdominal pain has resolved. Her main complaint today is severe GERD. She is tachycardic in the low 100s. Looking back in her chart from previous admission and prior to that she has been tachycardic in the low 100s. She has never had cardiac work-up in the past. She does not check her vital signs at home.  Patient admitted as above with acute intractable nausea vomiting diarrhea abdominal pain concerning for infectious process as she did meet SIRS criteria but given negative GI pathogen panel thus far, negative C. difficile testing and resolution of symptoms, appears to be possibly viral in nature given its rapid resolution with supportive care alone.  This time patient is stable and agreeable for discharge home, low-grade fever yesterday likely reactive given transient episode of hypoxia again likely secondary to volume overload from IV fluids at intake with her profound diarrhea and poor p.o. intake over the previous days prior to admission.  This time patient is no longer hypoxic, ambulating without difficulty, back to baseline as she  does not have a "normal" bowel regimen she continues to have somewhat soft stools and minimal nausea but again this is not different from her baseline.  At this time patient is otherwise stable and agreeable for discharge home, close follow-up with PCP by the end of the week, will need repeat testing and lab work including basic metabolic panel to follow for potassium given it has been stable and low in the setting of diarrhea prior to admission.  Patient will continue to follow with oncology for further treatment of known metastatic breast cancer on oral chemotherapy at this time.  Discharge Diagnoses:  Principal Problem:   Sepsis ([place]) Active Problems:   Anxiety disorder   Insomnia   Breast cancer ([place])   Diarrhea   GERD (gastroesophageal reflux disease)   Hyperlipidemia   CKD (chronic kidney disease), stage III   Depression   Malignant hypertension   Transaminitis   Lactic acidosis   Hepatic steatosis   Nausea & vomiting   Obesity (BMI 30-39.9)    Discharge Instructions  Discharge Instructions    Call MD for:  difficulty breathing, headache or visual disturbances   Complete by: As directed    Call MD for:  extreme fatigue   Complete by: As directed    Call MD for:  hives   Complete by: As directed    Call MD for:  persistant dizziness or light-headedness   Complete by: As directed    Call MD for:  persistant nausea and vomiting   Complete by: As directed    Call MD for:  severe uncontrolled pain   Complete by: As directed    Call MD for:  temperature >100.4   Complete by: As directed  Diet - low sodium heart healthy   Complete by: As directed    Discharge instructions   Complete by: As directed    Follow-up with PCP, will need lab work including basic metabolic panel to follow potassium by the end of the week/April 9.   Increase activity slowly   Complete by: As directed      Allergies as of 09/08/2019      Reactions   Zithromax [azithromycin] Shortness Of  Breath, Itching   TOTAL BODY ITCHING [EVEN SOLES OF FEET] WHEEZING   Psyllium Nausea And Vomiting      Medication List    TAKE these medications   albuterol 108 (90 Base) MCG/ACT inhaler Commonly known as: VENTOLIN HFA Inhale 2 puffs into the lungs every 6 (six) hours as needed for wheezing or shortness of breath.   ALPRAZolam 1 MG tablet Commonly known as: XANAX Take 1 mg by mouth daily as needed for anxiety.   amitriptyline 25 MG tablet Commonly known as: ELAVIL Take 3 tablets (75 mg total) by mouth at bedtime. What changed: medication strength   atorvastatin 10 MG tablet Commonly known as: LIPITOR Take 10 mg by mouth daily.   benzonatate 100 MG capsule Commonly known as: TESSALON Take 1 capsule (100 mg total) by mouth 3 (three) times daily as needed for cough.   ergocalciferol 1.25 MG (50000 UT) capsule Commonly known as: VITAMIN D2 Take 1 capsule (50,000 Units total) by mouth once a week.   Flovent HFA 110 MCG/ACT inhaler Generic drug: fluticasone Inhale 2 puffs into the lungs 2 (two) times daily.   guaiFENesin-dextromethorphan 100-10 MG/5ML syrup Commonly known as: ROBITUSSIN DM Take 10 mLs by mouth every 4 (four) hours as needed for cough.   HYDROcodone-acetaminophen 5-325 MG tablet Commonly known as: NORCO/VICODIN Take 1-2 tablets by mouth every 6 (six) hours as needed for moderate pain.   HYDROcodone-homatropine 5-1.5 MG/5ML syrup Commonly known as: Hycodan Take 5 mLs by mouth every 6 (six) hours as needed for cough.   letrozole 2.5 MG tablet Commonly known as: FEMARA Take 1 tablet (2.5 mg total) by mouth daily.   Magnesium 500 MG Tabs Take 1 tablet (500 mg total) by mouth in the morning and at bedtime.   Mucinex 600 MG 12 hr tablet Generic drug: guaiFENesin Take 1 tablet (600 mg total) by mouth 2 (two) times daily.   omeprazole 10 MG capsule Commonly known as: PRILOSEC Take 20 mg by mouth daily as needed (heartburn).   ondansetron 8 MG  tablet Commonly known as: ZOFRAN Take 1 tablet (8 mg total) by mouth every 8 (eight) hours as needed for nausea or vomiting.   PARoxetine 10 MG tablet Commonly known as: PAXIL Take 10 mg by mouth daily.   Verzenio 100 MG tablet Generic drug: abemaciclib TAKE 1 TABLET BY MOUTH 2 TIMES DAILY. SWALLOW TABLETS WHOLE. DO NOT CHEW, CRUSH, OR SPLIT TABLETS BEFORE SWALLOWING.       Allergies  Allergen Reactions  . Zithromax [Azithromycin] Shortness Of Breath and Itching    TOTAL BODY ITCHING [EVEN SOLES OF FEET] WHEEZING   . Psyllium Nausea And Vomiting    Procedures/Studies: DG Chest 1 View  Result Date: 09/07/2019 CLINICAL DATA:  Tachycardia. Recently diagnosed with metastatic breast cancer on oral chemotherapy. EXAM: CHEST  1 VIEW COMPARISON:  Chest x-rays dated 09/04/2019 dated 08/04/2019. FINDINGS: Cardiomediastinal silhouette remains within normal limits in size and configuration given the semi-erect patient positioning. Probable mild central pulmonary vascular congestion. Lungs otherwise clear. No pleural  effusion or pneumothorax is seen. IMPRESSION: Probable mild central pulmonary vascular congestion suggesting mild volume overload. No evidence of pneumonia. Electronically Signed   By: Franki Cabot M.D.   On: 09/07/2019 11:16   CT ABDOMEN PELVIS W CONTRAST  Result Date: 09/04/2019 CLINICAL DATA:  Upper abdominal pain with nausea, vomiting, and diarrhea. EXAM: CT ABDOMEN AND PELVIS WITH CONTRAST TECHNIQUE: Multidetector CT imaging of the abdomen and pelvis was performed using the standard protocol following bolus administration of intravenous contrast. CONTRAST:  178mL OMNIPAQUE IOHEXOL 300 MG/ML  SOLN COMPARISON:  Abdominal ultrasound 07/03/2019 FINDINGS: Lower chest: History of breast cancer with postoperative changes seen inferiorly on the right where there was a previously a measured mass.Atrial septal occluder. Mild atelectatic type opacity at the bases. Hepatobiliary: Hepatic  steatosis. No evidence of biliary obstruction or stone. Pancreas: Unremarkable. Spleen: Unremarkable. Adrenals/Urinary Tract: Negative adrenals. No hydronephrosis or stone. Unremarkable bladder. Stomach/Bowel: No obstruction or visible inflammation. Appendicolith. No appendicitis. Vascular/Lymphatic: No acute vascular abnormality. No mass or adenopathy. Reproductive:Fibroid appearance at the left body and fundus measuring 35 mm. Other: No ascites or pneumoperitoneum. Musculoskeletal: No acute abnormalities. 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 Prior low lumbar surgery. IMPRESSION: 1. No acute finding. 2. Hepatic steatosis. Electronically Signed   By: [place] Fantasia M.D.   On: 09/04/2019 07:57   DG Chest Portable 1 View  Result Date: 09/04/2019 CLINICAL DATA:  Fever and body aches EXAM: PORTABLE CHEST 1 VIEW COMPARISON:  08/04/2019 FINDINGS: Streaky density at the lung bases which is mild. Lung volumes are low. There is no edema, consolidation, effusion, or pneumothorax. Normal heart size and mediastinal contours. 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 IMPRESSION: Low volume chest with mild atelectatic opacity at the bases. Electronically Signed   By: [place] Fantasia M.D.   On: 09/04/2019 06:50   DG SWALLOW [facility] SPEECH PATH  Result Date: 08/09/2019 Objective Swallowing Evaluation: Type of Study: MBS-Modified Barium Swallow Study  Patient Details Name: Akiesha Dykeman MRN: [id number] Date of Birth: Jan 10, 1966 Today's Date: 08/09/2019 Time: SLP Start Time (ACUTE ONLY): 1340 -SLP Stop Time (ACUTE ONLY): 1430 SLP Time Calculation (min) (ACUTE ONLY): 50 min Past Medical History: Past Medical History: Diagnosis Date . Arthritis  . ASD (atrial septal defect)   s/p closure with Amplatzer device 10/05/04 (Dr. Myriam Jacobson, [facility]) 10/05/04 . Cataract  . GERD (gastroesophageal reflux disease)  . Headache  . Heart murmur   no longer heard . Legally blind in right eye, as defined in [place]  . Lumbar  herniated disc  . Sciatica  Past Surgical History: Past Surgical History: Procedure Laterality Date . ABLATION   . BREAST SURGERY Bilateral 2011  Breast Reduction Surgery . BUNIONECTOMY   . CARDIAC CATHETERIZATION    10/05/04 [facility]): LM < 25%, otherwise normal coronaries. No pulmonary HTN, Mildly enlarged RV. Secundum ASD s/p closure. [facility] CARDIAC SURGERY   . CATARACT EXTRACTION   . CLEFT PALATE REPAIR    s/p cleft lip and palate repair . LUMBAR LAMINECTOMY/DECOMPRESSION MICRODISCECTOMY Left 05/23/2016  Procedure: LEFT L4-L5 LATERAL RECESS DECOMPRESSION WITH CENTRAL AND RIGHT DECOMPRESSION VIA LEFT SIDE;  Surgeon: [name], MD;  Location: [facility];  Service: Orthopedics;  Laterality: Left; . LUMBAR LAMINECTOMY/DECOMPRESSION MICRODISCECTOMY Left 05/23/2016  Procedure: LUMBAR LAMINECTOMY/DECOMPRESSION MICRODISCECTOMY Lumbar five - Sacral One 1 LEVEL;  Surgeon: [name], MD;  Location: [facility];  Service: Orthopedics;  Laterality: Left; . SHOULDER INJECTION Left 05/23/2016  Procedure: SHOULDER INJECTION;  Surgeon: [name], MD;  Location:  [facility];  Service: Orthopedics;  Laterality: Left;  band-aid per pa-c . TRANSTHORACIC ECHOCARDIOGRAM    12/15/05 [facility]): Mild LVH, EF > [id number], grade 1 diastolic dysfunction, Trivial MR/PR/TR. HPI: 54 yo female adm to [facility] with pneumonitis.  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  Pt complained of dysphagia - coughing/choking on liquids recently - over the last 2 weeks to one month.  Pt's BSE in hospital revealed concerns for dysphagia.  MBS advised and set up as OP so pt could discharge home.  Pt reports taking her PPI daily BID since leaving hospital and states it has been helpful to decrease cough associated with po.  Pt does endorse h/o having "esophageal erosions" previously.   Pt continues to report congestion and dried secretions in  her cleft region that she coughs and expectorates.  She also complains of halitosis regardless of time spent brushing her dentition.  Subjective: pt awake in chair Assessment / Plan / Recommendation CHL IP CLINICAL IMPRESSIONS 08/09/2019 Clinical Impression Pt presents with minimal oral variances in swallowing which may be self created to compensates for her cleft lip/palate.  No aspiration or penetration of any consistency tested was noted.  Pt with delay in oral transiting, piecemealing boluses *solids/puree* from anterior to posterior oral cavity to swallow requiring up to 3 repetitions to complete oral clearance.  Liquid swallow faciliates full clearance of anterior oral residuals.  Cues to place solid/puree boluses to mid oral -tongue decreased delay in oral transiting thus compensating for anterior lingual delay.  Liquids prematurely spilled into pharynx with swallowing triggering at various times including at tongue base and pyriform sinus.  Pt takes small boluses and reports this to be her baseline her entire life.  She did sense pharyngeal retention after swallowing tablet but pharynx was clear.  Extension of chin upward with pill swallow precariously left airway open, although pt had airway closed before liquid/tablet reach larynx. Advised she take pills with pudding/puree if needed - start and follow with liquids to assure clearance.   Chin tuck posture tested with pt given her report of "choking" when drinking liquids at times.  Chin tuck did not result in aspiration or penetration either - tested to assure not negative responses.  Of note, pt did appear to have minimal retrograde propulsion in esophagus without awareness.  And she did report sensation of residuals at pharynx x1 when pharynx was clear.  SLP provided pt with LPR (laryngopharyngeal reflux) screen with which she scored 30/45 - highly indicative of LPR.  Given her h/o known GERD, esophageal erosions, provided her with esophageal precautions.   All education completed using video loops and written instructions.  Thanks for this referral of this most kind patient.  Of note, pt coughed during and after MBS - no aspiration observed during MBS.   SLP Visit Diagnosis Dysphagia, oral phase (R13.11) Attention and concentration deficit following -- Frontal lobe and executive function deficit following -- Impact on safety and function Mild aspiration risk   CHL IP TREATMENT RECOMMENDATION 08/05/2019 Treatment Recommendations F/U MBS in --- days (Comment)   No flowsheet data found. CHL IP DIET RECOMMENDATION 08/09/2019 SLP Diet Recommendations Regular solids;Thin liquid Liquid Administration via Cup;Straw Medication Administration Whole meds with liquid Compensations Slow rate;Small sips/bites Postural Changes Remain semi-upright after after feeds/meals (Comment);Seated upright at 90 degrees   CHL IP OTHER RECOMMENDATIONS 08/09/2019 Recommended Consults (No  Data) Oral Care Recommendations -- Other Recommendations --   CHL IP FOLLOW UP RECOMMENDATIONS 08/05/2019 Follow up Recommendations (No Data)   No flowsheet data found.     CHL IP ORAL PHASE 08/09/2019 Oral Phase Impaired Oral - Pudding Teaspoon -- Oral - Pudding Cup -- Oral - Honey Teaspoon -- Oral - Honey Cup -- Oral - Nectar Teaspoon -- Oral - Nectar Cup Delayed oral transit Oral - Nectar Straw -- Oral - Thin Teaspoon -- Oral - Thin Cup WFL;Premature spillage Oral - Thin Straw WFL Oral - Puree Piecemeal swallowing;Delayed oral transit Oral - Mech Soft -- Oral - Regular Piecemeal swallowing;Delayed oral transit Oral - Multi-Consistency -- Oral - Pill WFL;Delayed oral transit Oral Phase - Comment pt with delay in oral transiting, piecemealing boluses *solids/puree* from anterior to posterior oral cavity to swallow requiring up to 3 repetitions to complete oral clearance, placement of  boluses to mid oral -tongue decreased delay in oral transiting thus compensation for anterior lingual delay, Liquids prematurely spilled  into pharynx with swallowing triggering at various times including at tongue base and pyriform sinus  CHL IP PHARYNGEAL PHASE 08/09/2019 Pharyngeal Phase WFL;Impaired Pharyngeal- Pudding Teaspoon -- Pharyngeal -- Pharyngeal- Pudding Cup -- Pharyngeal -- Pharyngeal- Honey Teaspoon -- Pharyngeal -- Pharyngeal- Honey Cup -- Pharyngeal -- Pharyngeal- Nectar Teaspoon -- Pharyngeal -- Pharyngeal- Nectar Cup WFL Pharyngeal -- Pharyngeal- Nectar Straw -- Pharyngeal -- Pharyngeal- Thin Teaspoon -- Pharyngeal -- Pharyngeal- Thin Cup WFL Pharyngeal -- Pharyngeal- Thin Straw WFL Pharyngeal -- Pharyngeal- Puree WFL;Delayed swallow initiation-vallecula Pharyngeal -- Pharyngeal- Mechanical Soft -- Pharyngeal -- Pharyngeal- Regular WFL Pharyngeal -- Pharyngeal- Multi-consistency -- Pharyngeal -- Pharyngeal- Pill WFL Pharyngeal -- Pharyngeal Comment --  CHL IP CERVICAL ESOPHAGEAL PHASE 08/09/2019 Cervical Esophageal Phase WFL Appearance of backflow, retrograde propulsion without pt awareness, ? Consistent with dysmotility.  Tablet was not observed in esophagus after it was swallows Pudding Teaspoon -- Pudding Cup -- Honey Teaspoon -- Honey Cup -- Nectar Teaspoon -- Nectar Cup -- Nectar Straw -- Thin Teaspoon -- Thin Cup -- Thin Straw -- Puree -- Mechanical Soft -- Regular -- Multi-consistency -- Pill -- Cervical Esophageal Comment -- Kathleen Lime, MS [facility] SLP Acute Rehab Services Office [phone] Macario Golds 08/09/2019, 3:11 PM              ECHOCARDIOGRAM COMPLETE  Result Date: 09/05/2019    ECHOCARDIOGRAM REPORT   Patient Name:   AMIELA RENNER Date of Exam: 09/05/2019 Medical Rec #:  [id number]         Height:       67.0 in Accession #:    [id number]        Weight:       230.6 lb Date of Birth:  [birth year]         BSA:          2.148 m Patient Age:    42 years          BP:           137/70 mmHg Patient Gender: F                 HR:           75 bpm. Exam Location:  Inpatient Procedure: 2D Echo, Cardiac Doppler and Color Doppler  Indications:    I50.33 Acute on chronic diastolic (congestive) heart failure  History:        Patient has no prior history of Echocardiogram examinations.  Signs/Symptoms:Murmur; Risk Factors:GERD. ASD Closure.  Sonographer:    Jonelle Sidle Dance Referring Phys: Crystal  1. Technically difficult; normal LV function; grade 1 diastolic dysfunction; mild MR; s/p ASD closure; possible density projecting from device on apical views; suggest TEE to further assess.  2. Left ventricular ejection fraction, by estimation, is 55 to 60%. The left ventricle has normal function. The left ventricle has no regional wall motion abnormalities. Left ventricular diastolic parameters are consistent with Grade I diastolic dysfunction (impaired relaxation). Elevated left atrial pressure.  3. Right ventricular systolic function is normal. The right ventricular size is normal.  4. The mitral valve is normal in structure. Mild mitral valve regurgitation. No evidence of mitral stenosis.  5. The aortic valve is normal in structure. Aortic valve regurgitation is not visualized. No aortic stenosis is present. FINDINGS  Left Ventricle: Left ventricular ejection fraction, by estimation, is 55 to 60%. The left ventricle has normal function. The left ventricle has no regional wall motion abnormalities. The left ventricular internal cavity size was normal in size. There is  no left ventricular hypertrophy. Left ventricular diastolic parameters are consistent with Grade I diastolic dysfunction (impaired relaxation). Elevated left atrial pressure. Right Ventricle: The right ventricular size is normal. Right ventricular systolic function is normal. Left Atrium: Left atrial size was normal in size. Right Atrium: Right atrial size was normal in size. Pericardium: There is no evidence of pericardial effusion. Mitral Valve: The mitral valve is normal in structure. Normal mobility of the mitral valve leaflets. Mild mitral  valve regurgitation. No evidence of mitral valve stenosis. Tricuspid Valve: The tricuspid valve is normal in structure. Tricuspid valve regurgitation is mild . No evidence of tricuspid stenosis. Aortic Valve: The aortic valve is normal in structure. Aortic valve regurgitation is not visualized. No aortic stenosis is present. Pulmonic Valve: The pulmonic valve was normal in structure. Pulmonic valve regurgitation is trivial. No evidence of pulmonic stenosis. Aorta: The aortic root is normal in size and structure. Venous: The inferior vena cava was not well visualized. IAS/Shunts: No atrial level shunt detected by color flow Doppler. Additional Comments: Technically difficult; normal LV function; grade 1 diastolic dysfunction; mild MR; s/p ASD closure; possible density projecting from device on apical views; suggest TEE to further assess.  LEFT VENTRICLE PLAX 2D LVIDd:         4.60 cm  Diastology LVIDs:         3.20 cm  LV e' lateral:   6.74 cm/s LV PW:         0.80 cm  LV E/e' lateral: 16.3 LV IVS:        1.00 cm  LV e' medial:    6.42 cm/s LVOT diam:     1.70 cm  LV E/e' medial:  17.1 LV SV:         69 LV SV Index:   32 LVOT Area:     2.27 cm  RIGHT VENTRICLE             IVC RV Basal diam:  2.70 cm     IVC diam: 1.30 cm RV S prime:     10.60 cm/s TAPSE (M-mode): 1.8 cm LEFT ATRIUM             Index       RIGHT ATRIUM           Index LA diam:        2.80 cm 1.30 cm/m  RA Area:  11.40 cm LA Vol (A2C):   47.0 ml 21.88 ml/m RA Volume:   23.00 ml  10.71 ml/m LA Vol (A4C):   50.3 ml 23.41 ml/m LA Biplane Vol: 51.6 ml 24.02 ml/m  AORTIC VALVE LVOT Vmax:   129.00 cm/s LVOT Vmean:  97.300 cm/s LVOT VTI:    0.303 m  AORTA Ao Root diam: 3.30 cm Ao Asc diam:  2.50 cm MITRAL VALVE                TRICUSPID VALVE MV Area (PHT): 3.37 cm     TR Peak grad:   22.1 mmHg MV Decel Time: 225 msec     TR Vmax:        235.00 cm/s MV E velocity: 110.00 cm/s MV A velocity: 114.00 cm/s  SHUNTS MV E/A ratio:  0.96         Systemic  VTI:  0.30 m                             Systemic Diam: 1.70 cm Kirk Ruths MD Electronically signed by Kirk Ruths MD Signature Date/Time: 09/05/2019/3:53:46 PM    Final      Subjective: No acute issues or events overnight, patient's remained afebrile without hypoxia, back to baseline, requesting discharge home which is certainly reasonable.  His headache, fevers, chills, nausea, vomiting, diarrhea, constipation.   Discharge Exam: Vitals:   09/07/19 2032 09/08/19 0519  BP: 139/74 139/70  Pulse: 97 92  Resp: 17 17  Temp: 98.3 F (36.8 C) 98.2 F (36.8 C)  SpO2: 97% 95%   Vitals:   09/07/19 1426 09/07/19 1715 09/07/19 2032 09/08/19 0519  BP: 137/62 128/66 139/74 139/70  Pulse: 89 85 97 92  Resp: 20 20 17 17   Temp: 97.9 F (36.6 C) 98.1 F (36.7 C) 98.3 F (36.8 C) 98.2 F (36.8 C)  TempSrc: Oral Oral Oral Oral  SpO2: 94% 97% 97% 95%  Weight:      Height:        General:  Pleasantly resting in bed, No acute distress. HEENT:  Normocephalic atraumatic.  Sclerae nonicteric, noninjected.  Extraocular movements intact bilaterally. Neck:  Without mass or deformity.  Trachea is midline. Lungs:  Clear to auscultate bilaterally without rhonchi, wheeze, or rales. Heart:  Regular rate and rhythm.  Without murmurs, rubs, or gallops. Abdomen:  Soft, nontender, nondistended.  Without guarding or rebound. Extremities: Without cyanosis, clubbing, edema, or obvious deformity. Vascular:  Dorsalis pedis and posterior tibial pulses palpable bilaterally. Skin:  Warm and dry, no erythema, no ulcerations.   The results of significant diagnostics from this hospitalization (including imaging, microbiology, ancillary and laboratory) are listed below for reference.     Microbiology: Recent Results (from the past 240 hour(s))  Culture, blood (routine x 2)     Status: None (Preliminary result)   Collection Time: 09/04/19  5:04 AM   Specimen: BLOOD  Result Value Ref Range Status   Specimen  Description   Final    BLOOD RIGHT ARM Performed at [facility] [address], [place], [place] [address]    Special Requests   Final    BOTTLES DRAWN AEROBIC AND ANAEROBIC Blood Culture results may not be optimal due to an inadequate volume of blood received in culture bottles Performed at [facility] [address], [place], [place] [address]    Culture   Final    NO GROWTH 4 DAYS Performed  at [facility], [place] [address], [place], [place] [address]    Report Status PENDING  Incomplete  Culture, blood (routine x 2)     Status: None (Preliminary result)   Collection Time: 09/04/19  5:20 AM   Specimen: BLOOD  Result Value Ref Range Status   Specimen Description   Final    BLOOD BLOOD LEFT FOREARM Performed at [facility] [address] [place], [place] [address]    Special Requests   Final    BOTTLES DRAWN AEROBIC AND ANAEROBIC Blood Culture adequate volume Performed at [facility] [address] [place], [place] [address]    Culture   Final    NO GROWTH 4 DAYS Performed at [facility], [place] [address], [place], [place] [address]    Report Status PENDING  Incomplete  SARS CORONAVIRUS 2 (TAT 6-24 HRS) Nasopharyngeal Nasopharyngeal Swab     Status: None   Collection Time: 09/04/19  5:20 AM   Specimen: Nasopharyngeal Swab  Result Value Ref Range Status   SARS Coronavirus 2 NEGATIVE NEGATIVE Final    Comment: (NOTE) SARS-CoV-2 target nucleic acids are NOT DETECTED. The SARS-CoV-2 RNA is generally detectable in upper and lower respiratory specimens during the acute phase of infection. Negative results do not preclude SARS-CoV-2 infection, do not rule out co-infections with other pathogens, and should not be used as the sole basis for treatment or other patient management decisions. Negative results must be combined with clinical observations, patient history, and epidemiological  information. The expected result is Negative. Fact Sheet for Patients: SugarRoll.be Fact Sheet for Healthcare Providers: https://www.woods-mathews.com/ This test is not yet approved or cleared by the [place] FDA and  has been authorized for detection and/or diagnosis of SARS-CoV-2 by FDA under an Emergency Use Authorization (EUA). This EUA will remain  in effect (meaning this test can be used) for the duration of the COVID-19 declaration under Section 56 4(b)(1) of the Act, 21 U.S.C. section 360bbb-3(b)(1), unless the authorization is terminated or revoked sooner. Performed at [facility], [place] [address] [place], [place] [address]   Urine culture     Status: Abnormal   Collection Time: 09/04/19  9:53 AM   Specimen: Urine, Clean Catch  Result Value Ref Range Status   Specimen Description   Final    URINE, CLEAN CATCH Performed at [facility], [place] [address] [place], [place] [address]    Special Requests   Final    NONE Performed at [facility], [place] [address], [place], [place] [address]    Culture MULTIPLE SPECIES PRESENT, SUGGEST RECOLLECTION (A)  Final   Report Status 09/05/2019 FINAL  Final  Culture, Urine     Status: None   Collection Time: 09/05/19  1:21 PM   Specimen: Urine, Clean Catch  Result Value Ref Range Status   Specimen Description   Final    URINE, CLEAN CATCH Performed at [facility], [place] [address], [place], [place] [address]    Special Requests   Final    NONE Performed at [facility], [place] [address] [place], [place] [address]    Culture   Final    NO GROWTH Performed at [facility], [place] [address] [place], [place] [address]    Report Status 09/07/2019 FINAL  Final  C Difficile Quick Screen w PCR reflex     Status: None   Collection Time: 09/06/19  2:38 PM   Specimen: STOOL  Result Value Ref Range Status   C  Diff antigen NEGATIVE NEGATIVE Final   C Diff toxin NEGATIVE NEGATIVE Final   C Diff interpretation No C. difficile detected.  Final    Comment: Performed at [facility], [place] [address], [place], [place] [address]     Labs: BNP (last 3 results) Recent Labs    09/04/19 0309  BNP [id number]   Basic Metabolic Panel: Recent Labs  Lab 09/04/19 0309 09/05/19 0437 09/07/19 0501 09/08/19 0515  NA 140 137 142 139  K 4.0 3.6 2.9* 2.9*  CL 104 106 104 100  CO2 25 24 28 28   GLUCOSE 128* 118* 104* 109*  BUN 15 12 6 12   CREATININE 1.27* 1.07* 1.01* 1.19*  CALCIUM 9.0 8.0* 8.5* 8.5*  MG  --   --  1.2* 1.5*   Liver Function Tests: Recent Labs  Lab 09/04/19 0309 09/05/19 0437 09/07/19 0501 09/08/19 0515  AST 120* 46* 46* 34  ALT 108* 61* 52* 45*  ALKPHOS 146* 101 118 115  BILITOT 0.9 0.7 0.6 0.7  PROT 7.9 6.3* 6.7 6.9  ALBUMIN 3.7 2.8* 3.1* 3.1*   Recent Labs  Lab 09/04/19 0309 09/05/19 1517  LIPASE 38 36   No results for input(s): AMMONIA in the last 168 hours. CBC: Recent Labs  Lab 09/04/19 0309 09/05/19 0437 09/07/19 0501 09/08/19 0515  WBC 5.4 3.1* 6.1 5.1  NEUTROABS 4.2  --   --   --   HGB 12.3 10.6* 11.0* 11.7*  HCT 38.4 33.6* 34.1* 36.2  MCV 104.1* 107.3* 102.7* 104.0*  PLT 280 200 252 261   Cardiac Enzymes: No results for input(s): CKTOTAL, CKMB, CKMBINDEX, TROPONINI in the last 168 hours. BNP: Invalid input(s): POCBNP CBG: No results for input(s): GLUCAP in the last 168 hours. D-Dimer No results for input(s): DDIMER in the last 72 hours. Hgb A1c No results for input(s): HGBA1C in the last 72 hours. Lipid Profile No results for input(s): CHOL, HDL, LDLCALC, TRIG, CHOLHDL, LDLDIRECT in the last 72 hours. Thyroid function studies Recent Labs    09/06/19 0447  TSH 2.083   Anemia work up No results for input(s): VITAMINB12, FOLATE, FERRITIN, TIBC, IRON, RETICCTPCT in the last 72 hours. Urinalysis    Component Value Date/Time    COLORURINE YELLOW 09/04/2019 0953   APPEARANCEUR CLEAR 09/04/2019 0953   LABSPEC 1.041 (H) 09/04/2019 0953   PHURINE 5.0 09/04/2019 0953   GLUCOSEU NEGATIVE 09/04/2019 0953   HGBUR MODERATE (A) 09/04/2019 0953   BILIRUBINUR NEGATIVE 09/04/2019 0953   KETONESUR NEGATIVE 09/04/2019 0953   PROTEINUR NEGATIVE 09/04/2019 0953   UROBILINOGEN 1.0 02/22/2015 2105   NITRITE NEGATIVE 09/04/2019 0953   LEUKOCYTESUR SMALL (A) 09/04/2019 0953   Sepsis Labs Invalid input(s): PROCALCITONIN,  WBC,  LACTICIDVEN Microbiology Recent Results (from the past 240 hour(s))  Culture, blood (routine x 2)     Status: None (Preliminary result)   Collection Time: 09/04/19  5:04 AM   Specimen: BLOOD  Result Value Ref Range Status   Specimen Description   Final    BLOOD RIGHT ARM Performed at [facility], [place] [address] [place], [place] [address]    Special Requests   Final    BOTTLES DRAWN AEROBIC AND ANAEROBIC Blood Culture results may not be optimal due to an inadequate volume of blood received in culture bottles Performed at [facility] [address] [place], [place] [address]  Culture   Final    NO GROWTH 4 DAYS Performed at [facility], [place] [address], [place], [place] [address]    Report Status PENDING  Incomplete  Culture, blood (routine x 2)     Status: None (Preliminary result)   Collection Time: 09/04/19  5:20 AM   Specimen: BLOOD  Result Value Ref Range Status   Specimen Description   Final    BLOOD BLOOD LEFT FOREARM Performed at [facility] [address] [place], [place] [address]    Special Requests   Final    BOTTLES DRAWN AEROBIC AND ANAEROBIC Blood Culture adequate volume Performed at [facility] [address], [place], [place] [address]    Culture   Final    NO GROWTH 4 DAYS Performed at [facility], [place] [address], [place], [place] [address]    Report Status PENDING   Incomplete  SARS CORONAVIRUS 2 (TAT 6-24 HRS) Nasopharyngeal Nasopharyngeal Swab     Status: None   Collection Time: 09/04/19  5:20 AM   Specimen: Nasopharyngeal Swab  Result Value Ref Range Status   SARS Coronavirus 2 NEGATIVE NEGATIVE Final    Comment: (NOTE) SARS-CoV-2 target nucleic acids are NOT DETECTED. The SARS-CoV-2 RNA is generally detectable in upper and lower respiratory specimens during the acute phase of infection. Negative results do not preclude SARS-CoV-2 infection, do not rule out co-infections with other pathogens, and should not be used as the sole basis for treatment or other patient management decisions. Negative results must be combined with clinical observations, patient history, and epidemiological information. The expected result is Negative. Fact Sheet for Patients: SugarRoll.be Fact Sheet for Healthcare Providers: https://www.woods-mathews.com/ This test is not yet approved or cleared by the [place] FDA and  has been authorized for detection and/or diagnosis of SARS-CoV-2 by FDA under an Emergency Use Authorization (EUA). This EUA will remain  in effect (meaning this test can be used) for the duration of the COVID-19 declaration under Section 56 4(b)(1) of the Act, 21 U.S.C. section 360bbb-3(b)(1), unless the authorization is terminated or revoked sooner. Performed at [facility], [place] [address], [place], [place] [address]   Urine culture     Status: Abnormal   Collection Time: 09/04/19  9:53 AM   Specimen: Urine, Clean Catch  Result Value Ref Range Status   Specimen Description   Final    URINE, CLEAN CATCH Performed at [facility] [place] [address] [place], [place] [address]    Special Requests   Final    NONE Performed at [facility], [place] [address], [place], [place] [address]    Culture MULTIPLE SPECIES PRESENT, SUGGEST RECOLLECTION (A)  Final   Report  Status 09/05/2019 FINAL  Final  Culture, Urine     Status: None   Collection Time: 09/05/19  1:21 PM   Specimen: Urine, Clean Catch  Result Value Ref Range Status   Specimen Description   Final    URINE, CLEAN CATCH Performed at [facility], [place] [address] [place], [place] [address]    Special Requests   Final    NONE Performed at [facility], [place] [address], [place], [place] [address]    Culture   Final    NO GROWTH Performed at [facility], [place] [address] [place],  [address]    Report Status 09/07/2019 FINAL  Final  C Difficile Quick Screen w PCR  reflex     Status: None   Collection Time: 09/06/19  2:38 PM   Specimen: STOOL  Result Value Ref Range Status   C Diff antigen NEGATIVE NEGATIVE Final   C Diff toxin NEGATIVE NEGATIVE Final   C Diff interpretation No C. difficile detected.  Final    Comment: Performed at [facility], [place] [address], [place], [place] [address]     Time coordinating discharge: Over 30 minutes  SIGNED:   Little Ishikawa, DO [facility] 09/08/2019, 10:56 AM Pager   If 7PM-7AM, please contact night-coverage [email or web address]

## 2019-09-08 NOTE — Plan of Care (Signed)

## 2019-09-09 ENCOUNTER — Encounter: Payer: Self-pay | Admitting: Hematology

## 2019-09-09 LAB — GI PATHOGEN PANEL BY PCR, STOOL

## 2019-09-09 LAB — CULTURE, BLOOD (ROUTINE X 2)
Culture: NO GROWTH
Culture: NO GROWTH
Special Requests: ADEQUATE

## 2019-09-16 ENCOUNTER — Other Ambulatory Visit: Payer: Self-pay | Admitting: Hematology

## 2019-10-01 ENCOUNTER — Inpatient Hospital Stay: Payer: BC Managed Care – PPO | Attending: Hematology

## 2019-10-01 ENCOUNTER — Encounter: Payer: Self-pay | Admitting: *Deleted

## 2019-10-01 ENCOUNTER — Other Ambulatory Visit: Payer: Self-pay

## 2019-10-01 ENCOUNTER — Inpatient Hospital Stay (HOSPITAL_BASED_OUTPATIENT_CLINIC_OR_DEPARTMENT_OTHER): Payer: BC Managed Care – PPO | Admitting: Hematology

## 2019-10-01 ENCOUNTER — Other Ambulatory Visit: Payer: Self-pay | Admitting: *Deleted

## 2019-10-01 ENCOUNTER — Other Ambulatory Visit: Payer: Self-pay | Admitting: Hematology

## 2019-10-01 VITALS — BP 150/87 | HR 102 | Temp 98.2°F | Resp 18 | Ht 67.0 in | Wt 231.6 lb

## 2019-10-01 DIAGNOSIS — I700 Atherosclerosis of aorta: Secondary | ICD-10-CM | POA: Insufficient documentation

## 2019-10-01 DIAGNOSIS — Z6836 Body mass index (BMI) 36.0-36.9, adult: Secondary | ICD-10-CM | POA: Insufficient documentation

## 2019-10-01 DIAGNOSIS — C50911 Malignant neoplasm of unspecified site of right female breast: Secondary | ICD-10-CM | POA: Diagnosis not present

## 2019-10-01 DIAGNOSIS — K760 Fatty (change of) liver, not elsewhere classified: Secondary | ICD-10-CM | POA: Insufficient documentation

## 2019-10-01 DIAGNOSIS — R945 Abnormal results of liver function studies: Secondary | ICD-10-CM

## 2019-10-01 DIAGNOSIS — R7989 Other specified abnormal findings of blood chemistry: Secondary | ICD-10-CM

## 2019-10-01 DIAGNOSIS — C50919 Malignant neoplasm of unspecified site of unspecified female breast: Secondary | ICD-10-CM | POA: Diagnosis not present

## 2019-10-01 DIAGNOSIS — C7802 Secondary malignant neoplasm of left lung: Secondary | ICD-10-CM | POA: Diagnosis present

## 2019-10-01 DIAGNOSIS — F17210 Nicotine dependence, cigarettes, uncomplicated: Secondary | ICD-10-CM | POA: Diagnosis not present

## 2019-10-01 DIAGNOSIS — Z833 Family history of diabetes mellitus: Secondary | ICD-10-CM | POA: Insufficient documentation

## 2019-10-01 DIAGNOSIS — Z803 Family history of malignant neoplasm of breast: Secondary | ICD-10-CM | POA: Diagnosis not present

## 2019-10-01 DIAGNOSIS — C7801 Secondary malignant neoplasm of right lung: Secondary | ICD-10-CM | POA: Diagnosis present

## 2019-10-01 DIAGNOSIS — Z170 Estrogen receptor positive status [ER+]: Secondary | ICD-10-CM | POA: Insufficient documentation

## 2019-10-01 DIAGNOSIS — Z8249 Family history of ischemic heart disease and other diseases of the circulatory system: Secondary | ICD-10-CM | POA: Insufficient documentation

## 2019-10-01 LAB — CBC WITH DIFFERENTIAL/PLATELET
Abs Immature Granulocytes: 0.03 10*3/uL (ref 0.00–0.07)
Basophils Absolute: 0 10*3/uL (ref 0.0–0.1)
Basophils Relative: 0 %
Eosinophils Absolute: 0 10*3/uL (ref 0.0–0.5)
Eosinophils Relative: 0 %
HCT: 35.4 % — ABNORMAL LOW (ref 36.0–46.0)
Hemoglobin: 11.6 g/dL — ABNORMAL LOW (ref 12.0–15.0)
Immature Granulocytes: 1 %
Lymphocytes Relative: 49 %
Lymphs Abs: 3 10*3/uL (ref 0.7–4.0)
MCH: 33.3 pg (ref 26.0–34.0)
MCHC: 32.8 g/dL (ref 30.0–36.0)
MCV: 101.7 fL — ABNORMAL HIGH (ref 80.0–100.0)
Monocytes Absolute: 0.3 10*3/uL (ref 0.1–1.0)
Monocytes Relative: 5 %
Neutro Abs: 2.7 10*3/uL (ref 1.7–7.7)
Neutrophils Relative %: 45 %
Platelets: 228 10*3/uL (ref 150–400)
RBC: 3.48 MIL/uL — ABNORMAL LOW (ref 3.87–5.11)
RDW: 13.6 % (ref 11.5–15.5)
WBC: 6 10*3/uL (ref 4.0–10.5)
nRBC: 0 % (ref 0.0–0.2)

## 2019-10-01 LAB — CMP (CANCER CENTER ONLY)
ALT: 188 U/L — ABNORMAL HIGH (ref 0–44)
AST: 150 U/L — ABNORMAL HIGH (ref 15–41)
Albumin: 3.5 g/dL (ref 3.5–5.0)
Alkaline Phosphatase: 180 U/L — ABNORMAL HIGH (ref 38–126)
Anion gap: 10 (ref 5–15)
BUN: 15 mg/dL (ref 6–20)
CO2: 29 mmol/L (ref 22–32)
Calcium: 8.5 mg/dL — ABNORMAL LOW (ref 8.9–10.3)
Chloride: 105 mmol/L (ref 98–111)
Creatinine: 1.13 mg/dL — ABNORMAL HIGH (ref 0.44–1.00)
GFR, Est AFR Am: >60 mL/min (ref >60)
GFR, Estimated: 55 mL/min — ABNORMAL LOW (ref >60)
Glucose, Bld: 96 mg/dL (ref 70–99)
Potassium: 3.6 mmol/L (ref 3.5–5.1)
Sodium: 144 mmol/L (ref 135–145)
Total Bilirubin: 0.4 mg/dL (ref 0.3–1.2)
Total Protein: 7.7 g/dL (ref 6.5–8.1)

## 2019-10-01 LAB — MAGNESIUM: Magnesium: 1.3 mg/dL — CL (ref 1.7–2.4)

## 2019-10-01 NOTE — Progress Notes (Signed)
[place] Work  Patient came to [place] Workers office requesting assistance with financial stress.  Patient stated she started receiving social security disability in the fall , and currently has [employer] through the market place.  The monthly cost and co-pays are causing increased financial burden.  CSW and patient discussed financial resources at [facility] and the community.  CSW and patient reviewed application for [employer].  Patient plans to collect required documents and contact CSW when she is ready to submit application.  CSW also encouraged patient to contact [place] financial advocate to apply for the [place].  CSW provided contact information and encouraged patient to all with questions or concerns.  Johnnye Lana, MSW, LCSW, OSW-C Clinical Social Worker [facility] [phone]

## 2019-10-01 NOTE — Progress Notes (Signed)
HEMATOLOGY/ONCOLOGY CLINIC NOTE  Date of Service: 10/01/2019  Patient Care Team: [facility] as PCP - General Everardo Beals, NP as Nurse Practitioner [facility]  CHIEF COMPLAINTS/PURPOSE OF CONSULTATION:  -f/u for metastatic breast cancer   HISTORY OF PRESENTING ILLNESS:  Brandy [name] is a wonderful 54 y.o. female who has been referred to [place] by [facility] for evaluation and management of her suspected breast cancer.   The pt reports that she was having increasing low back pain on her right side that would not resolve, so she went to the ED. This pain has not resolved. She notes pain in her hips and knees.   Of note prior to the patient's visit today, pt has had an abdominal limited right upper ultrasound completed on 02/18/2019 with results revealing "Probable hepatic steatosis. No other abnormality seen in the right upper quadrant of the abdomen."  Pt had abdomen and pelvis CT completed on 02/18/2019 with results revealing "5cm spiculated soft tissue mass in inferior right breast, highly suspicious for primary breast carcinoma. No acute findings or metastatic disease within the abdomen or pelvis. Multiple small pulmonary nodules in both lung bases, consistent with pulmonary metastases. 4.5 cm uterine fibroid."  Most recent lab results (02/18/2019) of CBC is as follows: all values are WNL except for glucose at 121, calcium at 8.7, AST at 49, and ALT at 53.  On review of systems, pt reports pedal edema and denies chest pain, shortness of breath, weight loss, and any other symptoms.  Her last known mammography was on 06/18/2012.   On Social Hx the pt reports that she is a smoker and she smokes about half a pack per day. She does not drink alcohol. She has three children: age 57, 70, and 16.  On Family Hx the pt reports lung cancer. Maternal Cousin passed from stomach cancer at the age of 2.    INTERVAL HISTORY:  Brandy [name] is a 54 y.o. female here for evaluation and management of likely metastatic breast cancer. The patient's last visit with [place] was on 08/20/2019. The pt reports that she is doing well overall.  The pt reports that she went to the ED on 09/04/2019 due to excessive vomiting and diarrhea and was found to have Norovirus. She is currently moving her bowels regularly and is no longer experiencing diarrhea or vomiting. Pt went to see her Orthopedist who would like for her to begin PT and started her on a Prednisone taper for her sciatica. She spoke to her PCP who recommended that she begin taking PO Iron and B12 supplements to see if that improves her fatigue. She saw her OBGYN yesterday who advised pt that she has hidradenitis suppurativa. She has had growths on her labia and in her underarms for some time, but notes that the number of growths has increased recently. Pt has decided not to have her lesions lanced at this time. Pt was given Doxycycline to use for 10 days and given direction to use sitz baths. She is scheduled to see her OBGYN in 1 week.   Pt has still been SOB but has been walking more and has been working with an Chiropodist. She denies any breast pain/discomfort.   Lab results today (10/01/19) of CBC w/diff and CMP is as follows: all values are WNL except for RBC at 3.48, Hgb at 11.6, HCT at 35.4, MCV at 101.7, Creatinine at 1.13, Calcium at 8.5, AST at 150, ALT  at 188, ALP at 180. 10/01/2019 Magnesium at 1.3 10/01/2019 CA 15-3 is in progress 10/01/2019 CA 27.29 is in progress   On review of systems, pt reports SOB, sore throat and denies diarrhea, vomiting, hot flashes, breast pain and any other symptoms.   MEDICAL HISTORY:  Past Medical History:  Diagnosis Date  . Arthritis   . ASD (atrial septal defect)    s/p closure with Amplatzer device 10/05/04 (Dr. Myriam Jacobson, [facility]) 10/05/04  . Cataract   . GERD (gastroesophageal reflux disease)   . Headache   . Heart  murmur    no longer heard  . Legally blind in right eye, as defined in [place]   . Lumbar herniated disc   . Sciatica   Uterine Fibroids  SURGICAL HISTORY: Past Surgical History:  Procedure Laterality Date  . ABLATION    . BREAST SURGERY Bilateral 2011   Breast Reduction Surgery  . BUNIONECTOMY    . CARDIAC CATHETERIZATION     10/05/04 [facility]): LM < 25%, otherwise normal coronaries. No pulmonary HTN, Mildly enlarged RV. Secundum ASD s/p closure.  Marland Kitchen CARDIAC SURGERY    . CATARACT EXTRACTION    . CLEFT PALATE REPAIR     s/p cleft lip and palate repair  . LUMBAR LAMINECTOMY/DECOMPRESSION MICRODISCECTOMY Left 05/23/2016   Procedure: LEFT L4-L5 LATERAL RECESS DECOMPRESSION WITH CENTRAL AND RIGHT DECOMPRESSION VIA LEFT SIDE;  Surgeon: [name], MD;  Location: [place];  Service: Orthopedics;  Laterality: Left;  . LUMBAR LAMINECTOMY/DECOMPRESSION MICRODISCECTOMY Left 05/23/2016   Procedure: LUMBAR LAMINECTOMY/DECOMPRESSION MICRODISCECTOMY Lumbar five - Sacral One 1 LEVEL;  Surgeon: [name], MD;  Location: [place];  Service: Orthopedics;  Laterality: Left;  . SHOULDER INJECTION Left 05/23/2016   Procedure: SHOULDER INJECTION;  Surgeon: [name], MD;  Location: [place];  Service: Orthopedics;  Laterality: Left;  band-aid per pa-c  . TRANSTHORACIC ECHOCARDIOGRAM     12/15/05 [facility]): Mild LVH, EF > 38%, grade 1 diastolic dysfunction, Trivial MR/PR/TR.  Endometrial ablation 2003 Breast Reduction, bilateral 2011  SOCIAL HISTORY: Social History   Socioeconomic History  . Marital status: Married    Spouse name: Not on file  . Number of children: 3  . Years of education: Not on file  . Highest education level: Not on file  Occupational History  . Not on file  Tobacco Use  . Smoking status: Current Every Day Smoker    Packs/day: 0.50    Types: Cigarettes  . Smokeless tobacco: Never Used  Substance and Sexual Activity  . Alcohol use: No  . Drug use: No  . Sexual activity: Not on file   Other Topics Concern  . Not on file  Social History Narrative  . Not on file   Social Determinants of Health   Financial Resource Strain:   . Difficulty of Paying Living Expenses:   Food Insecurity:   . Worried About Charity fundraiser in the Last Year:   . Arboriculturist in the Last Year:   Transportation Needs:   . Film/video editor (Medical):   Marland Kitchen Lack of Transportation (Non-Medical):   Physical Activity:   . Days of Exercise per Week:   . Minutes of Exercise per Session:   Stress:   . Feeling of Stress :   Social Connections:   . Frequency of Communication with Friends and Family:   . Frequency of Social Gatherings with Friends and Family:   . Attends Religious Services:   . Active  Member of Clubs or Organizations:   . Attends Archivist Meetings:   Marland Kitchen Marital Status:   Intimate Partner Violence:   . Fear of Current or Ex-Partner:   . Emotionally Abused:   Marland Kitchen Physically Abused:   . Sexually Abused:     FAMILY HISTORY: Family History  Problem Relation Age of Onset  . Diabetes Mother   . High blood pressure Mother   . Cancer Father        Lung  . Colon cancer Neg Hx   . Colon polyps Neg Hx   . Esophageal cancer Neg Hx   . Rectal cancer Neg Hx   . Stomach cancer Neg Hx     ALLERGIES:  is allergic to zithromax [azithromycin] and psyllium.  MEDICATIONS:  Current Outpatient Medications  Medication Sig Dispense Refill  . albuterol (VENTOLIN HFA) 108 (90 Base) MCG/ACT inhaler Inhale 2 puffs into the lungs every 6 (six) hours as needed for wheezing or shortness of breath. 18 g 5  . ALPRAZolam (XANAX) 1 MG tablet Take 1 mg by mouth daily as needed for anxiety.     Marland Kitchen amitriptyline (ELAVIL) 25 MG tablet Take 3 tablets (75 mg total) by mouth at bedtime. 30 tablet 0  . atorvastatin (LIPITOR) 10 MG tablet Take 10 mg by mouth daily.    . Cyanocobalamin (VITAMIN B 12 PO) Take 1 tablet by mouth daily.    . ergocalciferol (VITAMIN D2) 1.25 MG (50000 UT)  capsule Take 1 capsule (50,000 Units total) by mouth once a week. 12 capsule 3  . Ferrous Sulfate (IRON PO) Take 1 tablet by mouth daily.    . fluticasone (FLOVENT HFA) 110 MCG/ACT inhaler Inhale 2 puffs into the lungs 2 (two) times daily. 1 Inhaler 5  . guaiFENesin (MUCINEX) 600 MG 12 hr tablet Take 1 tablet (600 mg total) by mouth 2 (two) times daily. 60 tablet 2  . guaiFENesin-dextromethorphan (ROBITUSSIN DM) 100-10 MG/5ML syrup Take 10 mLs by mouth every 4 (four) hours as needed for cough. 118 mL 0  . letrozole (FEMARA) 2.5 MG tablet TAKE 1 TABLET BY MOUTH ONCE DAILY 30 tablet 5  . Magnesium 500 MG TABS Take 1 tablet (500 mg total) by mouth in the morning and at bedtime. 60 tablet 1  . omeprazole (PRILOSEC) 10 MG capsule Take 20 mg by mouth daily as needed (heartburn).     . ondansetron (ZOFRAN) 8 MG tablet Take 1 tablet (8 mg total) by mouth every 8 (eight) hours as needed for nausea or vomiting. 30 tablet 3  . PARoxetine (PAXIL) 10 MG tablet Take 10 mg by mouth daily.    Marland Kitchen VERZENIO 100 MG tablet TAKE 1 TABLET BY MOUTH 2 TIMES DAILY. SWALLOW TABLETS WHOLE. DO NOT CHEW, CRUSH, OR SPLIT TABLETS BEFORE SWALLOWING. 28 tablet 2  . benzonatate (TESSALON) 100 MG capsule Take 1 capsule (100 mg total) by mouth 3 (three) times daily as needed for cough. (Patient not taking: Reported on 10/01/2019) 60 capsule 0  . HYDROcodone-acetaminophen (NORCO/VICODIN) 5-325 MG tablet Take 1-2 tablets by mouth every 6 (six) hours as needed for moderate pain.    Marland Kitchen HYDROcodone-homatropine (HYCODAN) 5-1.5 MG/5ML syrup Take 5 mLs by mouth every 6 (six) hours as needed for cough. (Patient not taking: Reported on 10/01/2019) 120 mL 0  . methylPREDNISolone (MEDROL DOSEPAK) 4 MG TBPK tablet See admin instructions. follow package directions     Current Facility-Administered Medications  Medication Dose Route Frequency Provider Last Rate Last Admin  .  0.9 %  sodium chloride infusion  500 mL Intravenous Continuous Nandigam,  Venia Minks, MD        REVIEW OF SYSTEMS:   A 10+ POINT REVIEW OF SYSTEMS WAS OBTAINED including neurology, dermatology, psychiatry, cardiac, respiratory, lymph, extremities, GI, GU, Musculoskeletal, constitutional, breasts, reproductive, HEENT.  All pertinent positives are noted in the HPI.  All others are negative.    PHYSICAL EXAMINATION: ECOG FS:1 - Symptomatic but completely ambulatory  Vitals:   10/01/19 1029  BP: (!) 150/87  Pulse: (!) 102  Resp: 18  Temp: 98.2 F (36.8 C)  SpO2: 99%   Wt Readings from Last 3 Encounters:  10/01/19 231 lb 9.6 oz (105.1 kg)  09/04/19 230 lb 9.6 oz (104.6 kg)  08/20/19 226 lb 14.4 oz (102.9 kg)   Body mass index is 36.27 kg/m.    GENERAL:alert, in no acute distress and comfortable SKIN: no acute rashes, no significant lesions EYES: conjunctiva are pink and non-injected, sclera anicteric OROPHARYNX: MMM, no exudates, no oropharyngeal erythema or ulceration NECK: supple, no JVD LYMPH:  no palpable lymphadenopathy in the cervical, axillary or inguinal regions LUNGS: clear to auscultation b/l with normal respiratory effort HEART: regular rate & rhythm ABDOMEN:  normoactive bowel sounds , non tender, not distended. No palpable hepatosplenomegaly.  Extremity: no pedal edema PSYCH: alert & oriented x 3 with fluent speech NEURO: no focal motor/sensory deficits  LABORATORY DATA:  I have reviewed the data as listed  . CBC Latest Ref Rng & Units 10/01/2019 09/08/2019 09/07/2019  WBC 4.0 - 10.5 K/uL 6.0 5.1 6.1  Hemoglobin 12.0 - 15.0 g/dL 11.6(L) 11.7(L) 11.0(L)  Hematocrit 36.0 - 46.0 % 35.4(L) 36.2 34.1(L)  Platelets 150 - 400 K/uL 228 261 252    . CMP Latest Ref Rng & Units 10/01/2019 09/08/2019 09/07/2019  Glucose 70 - 99 mg/dL 96 109(H) 104(H)  BUN 6 - 20 mg/dL _0 Creatinine 0.44 - 1.00 mg/dL 1.13(H) 1.19(H) 1.01(H)  Sodium 135 - 145 mmol/L 144 139 142  Potassium 3.5 - 5.1 mmol/L 3.6 2.9(L) 2.9(L)  Chloride 98 - 111 mmol/L 105 100 104   CO2 22 - 32 mmol/L _1 Calcium 8.9 - 10.3 mg/dL 8.5(L) 8.5(L) 8.5(L)  Total Protein 6.5 - 8.1 g/dL 7.7 6.9 6.7  Total Bilirubin 0.3 - 1.2 mg/dL 0.4 0.7 0.6  Alkaline Phos 38 - 126 U/L 180(H) 115 118  AST 15 - 41 U/L 150(H) 34 46(H)  ALT 0 - 44 U/L 188(H) 45(H) 52(H)   Component     Latest Ref Rng & Units 03/08/2019 03/22/2019  FSH     mIU/mL 65.5   LH     mIU/mL 46.6   Estrogen     pg/mL 82   Progesterone     ng/mL <0.1   Estradiol     pg/mL  <5.0      RADIOGRAPHIC STUDIES: I have personally reviewed the radiological images as listed and agreed with the findings in the report. DG Chest 1 View  Result Date: 09/07/2019 CLINICAL DATA:  Tachycardia. Recently diagnosed with metastatic breast cancer on oral chemotherapy. EXAM: CHEST  1 VIEW COMPARISON:  Chest x-rays dated 09/04/2019 dated 08/04/2019. FINDINGS: Cardiomediastinal silhouette remains within normal limits in size and configuration given the semi-erect patient positioning. Probable mild central pulmonary vascular congestion. Lungs otherwise clear. No pleural effusion or pneumothorax is seen. IMPRESSION: Probable mild central pulmonary vascular congestion suggesting mild volume overload. No evidence of pneumonia. Electronically Signed   By:  Franki Cabot M.D.   On: 09/07/2019 11:16   CT ABDOMEN PELVIS W CONTRAST  Result Date: 09/04/2019 CLINICAL DATA:  Upper abdominal pain with nausea, vomiting, and diarrhea. EXAM: CT ABDOMEN AND PELVIS WITH CONTRAST TECHNIQUE: Multidetector CT imaging of the abdomen and pelvis was performed using the standard protocol following bolus administration of intravenous contrast. CONTRAST:  155m OMNIPAQUE IOHEXOL 300 MG/ML  SOLN COMPARISON:  Abdominal ultrasound 07/03/2019 FINDINGS: Lower chest: History of breast cancer with postoperative changes seen inferiorly on the right where there was a previously a measured mass.Atrial septal occluder. Mild atelectatic type opacity at the bases.  Hepatobiliary: Hepatic steatosis. No evidence of biliary obstruction or stone. Pancreas: Unremarkable. Spleen: Unremarkable. Adrenals/Urinary Tract: Negative adrenals. No hydronephrosis or stone. Unremarkable bladder. Stomach/Bowel: No obstruction or visible inflammation. Appendicolith. No appendicitis. Vascular/Lymphatic: No acute vascular abnormality. No mass or adenopathy. Reproductive:Fibroid appearance at the left body and fundus measuring 35 mm. Other: No ascites or pneumoperitoneum. Musculoskeletal: No acute abnormalities. 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 Prior low lumbar surgery. IMPRESSION: 1. No acute finding. 2. Hepatic steatosis. Electronically Signed   By: JMonte FantasiaM.D.   On: 09/04/2019 07:57   DG Chest Portable 1 View  Result Date: 09/04/2019 CLINICAL DATA:  Fever and body aches EXAM: PORTABLE CHEST 1 VIEW COMPARISON:  08/04/2019 FINDINGS: Streaky density at the lung bases which is mild. Lung volumes are low. There is no edema, consolidation, effusion, or pneumothorax. Normal heart size and mediastinal contours. 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 IMPRESSION: Low volume chest with mild atelectatic opacity at the bases. Electronically Signed   By: JMonte FantasiaM.D.   On: 09/04/2019 06:50   ECHOCARDIOGRAM COMPLETE  Result Date: 09/05/2019    ECHOCARDIOGRAM REPORT   Patient Name:   ELANDRA HOWZEDate of Exam: 09/05/2019 Medical Rec #:  [id number]        Height:       67.0 in Accession #:    [id number]       Weight:       230.6 lb Date of Birth:  225-Dec-1967        BSA:          2.148 m Patient Age:    89years          BP:           137/70 mmHg Patient Gender: F                 HR:           75 bpm. Exam Location:  Inpatient Procedure: 2D Echo, Cardiac Doppler and Color Doppler Indications:    I50.33 Acute on chronic diastolic (congestive) heart failure  History:        Patient has no prior history of Echocardiogram examinations.                  Signs/Symptoms:Murmur; Risk Factors:GERD. ASD Closure.  Sonographer:    TJonelle SidleDance Referring Phys: 2Horace 1. Technically difficult; normal LV function; grade 1 diastolic dysfunction; mild MR; s/p ASD closure; possible density projecting from device on apical views; suggest TEE to further assess.  2. Left ventricular ejection fraction, by estimation, is 55 to 60%. The left ventricle has normal function. The left ventricle has no regional wall motion abnormalities. Left ventricular diastolic parameters are consistent with Grade I diastolic dysfunction (impaired relaxation). Elevated left atrial pressure.  3. Right ventricular systolic function is normal.  The right ventricular size is normal.  4. The mitral valve is normal in structure. Mild mitral valve regurgitation. No evidence of mitral stenosis.  5. The aortic valve is normal in structure. Aortic valve regurgitation is not visualized. No aortic stenosis is present. FINDINGS  Left Ventricle: Left ventricular ejection fraction, by estimation, is 55 to 60%. The left ventricle has normal function. The left ventricle has no regional wall motion abnormalities. The left ventricular internal cavity size was normal in size. There is  no left ventricular hypertrophy. Left ventricular diastolic parameters are consistent with Grade I diastolic dysfunction (impaired relaxation). Elevated left atrial pressure. Right Ventricle: The right ventricular size is normal. Right ventricular systolic function is normal. Left Atrium: Left atrial size was normal in size. Right Atrium: Right atrial size was normal in size. Pericardium: There is no evidence of pericardial effusion. Mitral Valve: The mitral valve is normal in structure. Normal mobility of the mitral valve leaflets. Mild mitral valve regurgitation. No evidence of mitral valve stenosis. Tricuspid Valve: The tricuspid valve is normal in structure. Tricuspid valve regurgitation is mild . No  evidence of tricuspid stenosis. Aortic Valve: The aortic valve is normal in structure. Aortic valve regurgitation is not visualized. No aortic stenosis is present. Pulmonic Valve: The pulmonic valve was normal in structure. Pulmonic valve regurgitation is trivial. No evidence of pulmonic stenosis. Aorta: The aortic root is normal in size and structure. Venous: The inferior vena cava was not well visualized. IAS/Shunts: No atrial level shunt detected by color flow Doppler. Additional Comments: Technically difficult; normal LV function; grade 1 diastolic dysfunction; mild MR; s/p ASD closure; possible density projecting from device on apical views; suggest TEE to further assess.  LEFT VENTRICLE PLAX 2D LVIDd:         4.60 cm  Diastology LVIDs:         3.20 cm  LV e' lateral:   6.74 cm/s LV PW:         0.80 cm  LV E/e' lateral: 16.3 LV IVS:        1.00 cm  LV e' medial:    6.42 cm/s LVOT diam:     1.70 cm  LV E/e' medial:  17.1 LV SV:         69 LV SV Index:   32 LVOT Area:     2.27 cm  RIGHT VENTRICLE             IVC RV Basal diam:  2.70 cm     IVC diam: 1.30 cm RV S prime:     10.60 cm/s TAPSE (M-mode): 1.8 cm LEFT ATRIUM             Index       RIGHT ATRIUM           Index LA diam:        2.80 cm 1.30 cm/m  RA Area:     11.40 cm LA Vol (A2C):   47.0 ml 21.88 ml/m RA Volume:   23.00 ml  10.71 ml/m LA Vol (A4C):   50.3 ml 23.41 ml/m LA Biplane Vol: 51.6 ml 24.02 ml/m  AORTIC VALVE LVOT Vmax:   129.00 cm/s LVOT Vmean:  97.300 cm/s LVOT VTI:    0.303 m  AORTA Ao Root diam: 3.30 cm Ao Asc diam:  2.50 cm MITRAL VALVE                TRICUSPID VALVE MV Area (PHT): 3.37 cm     TR  Peak grad:   22.1 mmHg MV Decel Time: 225 msec     TR Vmax:        235.00 cm/s MV E velocity: 110.00 cm/s MV A velocity: 114.00 cm/s  SHUNTS MV E/A ratio:  0.96         Systemic VTI:  0.30 m                             Systemic Diam: 1.70 cm Kirk Ruths MD Electronically signed by Kirk Ruths MD Signature Date/Time: 09/05/2019/3:53:46 PM     Final     ASSESSMENT & PLAN:   Brandy [name] is a 54 y.o. female with:  1. Metastatic breast cancer ER+/PRneg/Her2 neg  02/28/2019 neck CT with results revealing "negative for mass or adenopathy in the neck."  03/04/2019 head MRI with results revealing "Negative for metastatic disease.  No acute abnormality in the brain."  2. Likely Pulmonary metastases  02/18/2019 chest and abdomen with results revealing "5cm spiculated soft tissue mass in inferior right breast, highly suspicious for primary breast carcinoma. No acute findings or metastatic disease within the abdomen or pelvis. Multiple small pulmonary nodules in both lung bases, consistent with pulmonary metastases. 4.5 cm uterine fibroid."  02/28/2019 C/A/P CT with results revealing "Irregular solid 5.0 cm right breast mass, suspicious for primary right breast malignancy. Innumerable solid pulmonary nodules scattered throughout both lungs, compatible with pulmonary metastases. No evidence of metastatic disease in the abdomen, pelvis or skeleton. Mildly enlarged and probably myomatous uterus. Simple 1.4 cm left adnexal cyst requires no follow-up. This recommendation follows ACR consensus guidelines: White Paper of the ACR Incidental Findings Committee II on Adnexal Findings. J Am Coll Radiol [phone]. Aortic Atherosclerosis (ICD10-I70.0)."  NUCLEAR MEDICINE WHOLE BODY BONE SCAN completed on 03/19/2019 with results revealing "1. No scintigraphic evidence skeletal metastasis. 2. Degenerative bone disease in the posterior elements of the upper and mid lumbar spine."   04/09/2019 Bone Density ([id number]) which revealed "The BMD measured at Femur Neck Right is 1.153 g/cm2 with a T-score of 0.8. This patient is considered normal according to [facility]) criteria. Lumbar spine was not utilized due to advanced degenerative changes. The scan quality is good. Femur Neck Right 04/09/2019 54.7 Normal 0.8 1.153 g/cm2.  Left Forearm Radius 33% 04/09/2019 54.7 Normal 0.8 0.949 g/cm2."  08/04/2019 CT Angio Chest ([id number]) revealed "1. No lobar or central pulmonary embolus detected. Exam is limited secondary to respiratory motion. 2. Mild ground-glass attenuation may represent mild pneumonitis or areas of air trapping. 3. Signs of atrial septal closure. 4. Decrease in size and number of bilateral pulmonary nodules, marked response noted on today's exam with the only nodule remaining near a cm in the right upper lobe and the smaller nodules that were present on the previous examination throughout the chest no longer measurable though the lower lobes are limited by respiratory motion. 5. Decreased size of right breast mass. 6. Probable hepatic steatosis."  3. Abnormal Liver function test -- previous? Fatty liver 07/03/2019 [place] Abd ([id number]) revealed "1. No acute findings. Normal gallbladder. No bile duct dilation. 2. Significant increased liver parenchymal echogenicity consistent with extensive hepatic steatosis."  PLAN: -Discussed pt labwork today, 10/01/19; blood counts are steady, liver enzymes are elevated again -Discussed 10/01/2019 Magnesium is low at 1.3-- increase magnesium rich foods and continue po magnesium replacement. -Discussed 10/01/2019 CA 15-3 is in progress, 08/20/2019 CA 15.3 at 37.9 -Discussed 10/01/2019 CA 27.29 is in  progress, 08/20/2019 CA 27.29 at 59.0 -Elevated enzymes could be due to recent infection or Doxycycline + Fatty Liver  -Recommend pt hold PO Iron at this time, hold Lipitor for one month, and only complete 5 days of Doxycycline -Recommend pt restart Magnesium supplement ASAP -Advised pt that there are no contraindications to her taking Vitamin B12 at this time -Recommend pt use cultured yogurt or OTC probiotics to help prevent diarrhea from Doxycycline + Letrozole + Verzenio -Pt's sore throat could be due to oral thrush from antibiotics + steroids. No clinical signs of oral  thrush. Recommend pt use salt/baking soda mouthwash. -Advised pt to continue to take safety precautions when going to public spaces -Recommend pt wear loose-fitting clothing and keep her groin area dry  -Will repeat Chest CT and Mammogram in 2-3 months  -Recommend pt f/u as scheduled with OBGYN -Continue Verzino and Letrozole as prescribed -Continue to monitor LFs -Will see back in 4 weeks with labs    FOLLOW UP: RTC with Dr Irene Limbo with labs in 4 weeks   The total time spent in the appt was 30 minutes and more than 50% was on counseling and direct patient cares.  All of the patient's questions were answered with apparent satisfaction. The patient knows to call the clinic with any problems, questions or concerns.    [place] Lone MD [place] AAHIVMS [facility] Hematology/Oncology Physician [facility]  (Office):       [phone] (Work cell):  [phone] (Fax):           [phone]  10/01/2019 12:32 PM  I, Yevette Edwards, am acting as a scribe for Dr. Sullivan Lone.   .I have reviewed the above documentation for accuracy and completeness, and I agree with the above. Brunetta Genera MD

## 2019-10-02 ENCOUNTER — Other Ambulatory Visit: Payer: Self-pay | Admitting: Hematology

## 2019-10-02 LAB — CANCER ANTIGEN 15-3: CA 15-3: 33 U/mL — ABNORMAL HIGH (ref 0.0–25.0)

## 2019-10-02 LAB — CANCER ANTIGEN 27.29: CA 27.29: 53.9 U/mL — ABNORMAL HIGH (ref 0.0–38.6)

## 2019-10-03 ENCOUNTER — Encounter: Payer: Self-pay | Admitting: *Deleted

## 2019-10-03 ENCOUNTER — Telehealth: Payer: Self-pay | Admitting: Hematology

## 2019-10-03 ENCOUNTER — Telehealth: Payer: Self-pay | Admitting: *Deleted

## 2019-10-03 NOTE — Telephone Encounter (Signed)
Contacted patient to let her know her that her magnesium is 1.3 which is a little low. Dr. Irene Limbo recommends she continue taking po Magnesium 500 mg BID and make sure to eat magnesium rich foods. Reviewed foods with [id number], cashews, peanut butter, whole wheat bread, baked potatoes, yogurt. She verbalized understanding.

## 2019-10-03 NOTE — Telephone Encounter (Signed)
Scheduled per 04/27 los, patient has been called and voicemail was left. ?

## 2019-10-08 DIAGNOSIS — C50919 Malignant neoplasm of unspecified site of unspecified female breast: Secondary | ICD-10-CM | POA: Insufficient documentation

## 2019-10-29 ENCOUNTER — Other Ambulatory Visit: Payer: Self-pay | Admitting: Hematology

## 2019-10-31 NOTE — Progress Notes (Signed)
HEMATOLOGY/ONCOLOGY CLINIC NOTE  Date of Service: 11/01/2019  Patient Care Team: [facility] as PCP - General Everardo Beals, NP as Nurse Practitioner [facility]  CHIEF COMPLAINTS/PURPOSE OF CONSULTATION:  -f/u for metastatic breast cancer   HISTORY OF PRESENTING ILLNESS:  Brandy [name] is a wonderful 54 y.o. female who has been referred to [place] by [facility]* for evaluation and management of her suspected breast cancer.   The pt reports that she was having increasing low back pain on her right side that would not resolve, so she went to the ED. This pain has not resolved. She notes pain in her hips and knees.   Of note prior to the patient's visit today, pt has had an abdominal limited right upper ultrasound completed on 02/18/2019 with results revealing "Probable hepatic steatosis. No other abnormality seen in the right upper quadrant of the abdomen."  Pt had abdomen and pelvis CT completed on 02/18/2019 with results revealing "5cm spiculated soft tissue mass in inferior right breast, highly suspicious for primary breast carcinoma. No acute findings or metastatic disease within the abdomen or pelvis. Multiple small pulmonary nodules in both lung bases, consistent with pulmonary metastases. 4.5 cm uterine fibroid."  Most recent lab results (02/18/2019) of CBC is as follows: all values are WNL except for glucose at 121, calcium at 8.7, AST at 49, and ALT at 53.  On review of systems, pt reports pedal edema and denies chest pain, shortness of breath, weight loss, and any other symptoms.  Her last known mammography was on 06/18/2012.   On Social Hx the pt reports that she is a smoker and she smokes about half a pack per day. She does not drink alcohol. She has three children: age 2, 68, and 38.  On Family Hx the pt reports lung cancer. Maternal Cousin passed from stomach cancer at the age of 60.    INTERVAL HISTORY: Brandy [name] is a 54 y.o. female here for evaluation and management of likely metastatic breast cancer. The patient's last visit with [place] was on 10/01/19. The pt reports that she is doing well overall.  The pt reports she is fine. Pt has had both doses of the COVID19 vaccine. She has been tired, had no energy and having diarrhea. Pt is having very watery stool and on average 5 times a day. She had been taking imodium twice a day and has not been happening. Pt also has been coughing a lot and none of the medications are helping. She has been using her inhaler. Usually she uses 4 puff a day and she's been having to use her rescue inhaler. Pt has been having phlegm production that is thick and clear. Her PCP said shed order her a nebulizer to help but the pt never got them. She has not been smoking. Pt has right chest wall pain. Her eyes have also been drying her eyes out and she has been using artificial tears. The patient had to take a xanax at night. She has been getting SOB very quickly especially while laying down and bending over. Pt has not changed diet and she has tried to monitor what would trigger the diarrhea.   Lab results today (11/01/19) of CBC w/diff and CMP is as follows: all values are WNL except for RBC at 3.32, Hemoglobin at 11.1, HCT at 33.9, MCV at 102.1, Neutro abs at 0.9K, Glucose at 127, Creatinine at 1.35, Albumin at 3.3, AST at 85,  ALT at 100, Alkaline Phosphatase at 168, GFR, Est Non Af Am at 44, GFR, Est AFR Am at 51 11/01/19 of Magnesium at 1.3  On review of systems, pt reports fever, chills, fatigue, diarrhea, coughing, right chest wall pain, SOB, dry eyes, phlegm production, anxiety and denies any other symptoms.   MEDICAL HISTORY:  Past Medical History:  Diagnosis Date  . Arthritis   . ASD (atrial septal defect)    s/p closure with Amplatzer device 10/05/04 (Dr. Myriam Jacobson, [facility]) 10/05/04  . Cataract   . GERD (gastroesophageal reflux disease)   . Headache   . Heart  murmur    no longer heard  . Legally blind in right eye, as defined in [place]   . Lumbar herniated disc   . Sciatica   Uterine Fibroids  SURGICAL HISTORY: Past Surgical History:  Procedure Laterality Date  . ABLATION    . BREAST SURGERY Bilateral 2011   Breast Reduction Surgery  . BUNIONECTOMY    . CARDIAC CATHETERIZATION     10/05/04 [facility]): LM < 25%, otherwise normal coronaries. No pulmonary HTN, Mildly enlarged RV. Secundum ASD s/p closure.  Marland Kitchen CARDIAC SURGERY    . CATARACT EXTRACTION    . CLEFT PALATE REPAIR     s/p cleft lip and palate repair  . LUMBAR LAMINECTOMY/DECOMPRESSION MICRODISCECTOMY Left 05/23/2016   Procedure: LEFT L4-L5 LATERAL RECESS DECOMPRESSION WITH CENTRAL AND RIGHT DECOMPRESSION VIA LEFT SIDE;  Surgeon: [name], MD;  Location: [place];  Service: Orthopedics;  Laterality: Left;  . LUMBAR LAMINECTOMY/DECOMPRESSION MICRODISCECTOMY Left 05/23/2016   Procedure: LUMBAR LAMINECTOMY/DECOMPRESSION MICRODISCECTOMY Lumbar five - Sacral One 1 LEVEL;  Surgeon: [name], MD;  Location: [place];  Service: Orthopedics;  Laterality: Left;  . SHOULDER INJECTION Left 05/23/2016   Procedure: SHOULDER INJECTION;  Surgeon: [name], MD;  Location: [place];  Service: Orthopedics;  Laterality: Left;  band-aid per pa-c  . TRANSTHORACIC ECHOCARDIOGRAM     12/15/05 [facility]): Mild LVH, EF > 16%, grade 1 diastolic dysfunction, Trivial MR/PR/TR.  Endometrial ablation 2003 Breast Reduction, bilateral 2011  SOCIAL HISTORY: Social History   Socioeconomic History  . Marital status: Married    Spouse name: Not on file  . Number of children: 3  . Years of education: Not on file  . Highest education level: Not on file  Occupational History  . Not on file  Tobacco Use  . Smoking status: Current Every Day Smoker    Packs/day: 0.50    Types: Cigarettes  . Smokeless tobacco: Never Used  Substance and Sexual Activity  . Alcohol use: No  . Drug use: No  . Sexual activity: Not on file   Other Topics Concern  . Not on file  Social History Narrative  . Not on file   Social Determinants of Health   Financial Resource Strain:   . Difficulty of Paying Living Expenses:   Food Insecurity:   . Worried About Charity fundraiser in the Last Year:   . Arboriculturist in the Last Year:   Transportation Needs:   . Film/video editor (Medical):   Marland Kitchen Lack of Transportation (Non-Medical):   Physical Activity:   . Days of Exercise per Week:   . Minutes of Exercise per Session:   Stress:   . Feeling of Stress :   Social Connections:   . Frequency of Communication with Friends and Family:   . Frequency of Social Gatherings with Friends and Family:   .  Attends Religious Services:   . Active Member of Clubs or Organizations:   . Attends Archivist Meetings:   Marland Kitchen Marital Status:   Intimate Partner Violence:   . Fear of Current or Ex-Partner:   . Emotionally Abused:   Marland Kitchen Physically Abused:   . Sexually Abused:     FAMILY HISTORY: Family History  Problem Relation Age of Onset  . Diabetes Mother   . High blood pressure Mother   . Cancer Father        Lung  . Colon cancer Neg Hx   . Colon polyps Neg Hx   . Esophageal cancer Neg Hx   . Rectal cancer Neg Hx   . Stomach cancer Neg Hx     ALLERGIES:  is allergic to zithromax [azithromycin] and psyllium.  MEDICATIONS:  Current Outpatient Medications  Medication Sig Dispense Refill  . albuterol (VENTOLIN HFA) 108 (90 Base) MCG/ACT inhaler Inhale 2 puffs into the lungs every 6 (six) hours as needed for wheezing or shortness of breath. 18 g 5  . ALPRAZolam (XANAX) 1 MG tablet Take 1 mg by mouth daily as needed for anxiety.     Marland Kitchen amitriptyline (ELAVIL) 25 MG tablet Take 3 tablets (75 mg total) by mouth at bedtime. 30 tablet 0  . amitriptyline (ELAVIL) 75 MG tablet Take 75 mg by mouth daily.    Marland Kitchen atorvastatin (LIPITOR) 10 MG tablet Take 10 mg by mouth daily.    . benzonatate (TESSALON) 100 MG capsule Take 1  capsule (100 mg total) by mouth 3 (three) times daily as needed for cough. (Patient not taking: Reported on 10/01/2019) 60 capsule 0  . Cyanocobalamin (VITAMIN B 12 PO) Take 1 tablet by mouth daily.    . ergocalciferol (VITAMIN D2) 1.25 MG (50000 UT) capsule Take 1 capsule (50,000 Units total) by mouth once a week. 12 capsule 3  . Ferrous Sulfate (IRON PO) Take 1 tablet by mouth daily.    . fluticasone (FLOVENT HFA) 110 MCG/ACT inhaler Inhale 2 puffs into the lungs 2 (two) times daily. 1 Inhaler 5  . guaiFENesin (MUCINEX) 600 MG 12 hr tablet Take 1 tablet (600 mg total) by mouth 2 (two) times daily. 60 tablet 2  . guaiFENesin-dextromethorphan (ROBITUSSIN DM) 100-10 MG/5ML syrup Take 10 mLs by mouth every 4 (four) hours as needed for cough. 118 mL 0  . HYDROcodone-acetaminophen (NORCO/VICODIN) 5-325 MG tablet Take 1-2 tablets by mouth every 6 (six) hours as needed for moderate pain.    Marland Kitchen HYDROcodone-homatropine (HYCODAN) 5-1.5 MG/5ML syrup Take 5 mLs by mouth every 6 (six) hours as needed for cough. (Patient not taking: Reported on 10/01/2019) 120 mL 0  . letrozole (FEMARA) 2.5 MG tablet TAKE 1 TABLET BY MOUTH ONCE DAILY 30 tablet 5  . Magnesium 500 MG TABS Take 1 tablet (500 mg total) by mouth in the morning and at bedtime. 60 tablet 1  . magnesium oxide (MAG-OX) 400 MG tablet Take by mouth.    . methylPREDNISolone (MEDROL DOSEPAK) 4 MG TBPK tablet See admin instructions. follow package directions    . omeprazole (PRILOSEC) 10 MG capsule Take 20 mg by mouth daily as needed (heartburn).     . ondansetron (ZOFRAN) 8 MG tablet Take 1 tablet (8 mg total) by mouth every 8 (eight) hours as needed for nausea or vomiting. 30 tablet 3  . PARoxetine (PAXIL) 10 MG tablet Take 10 mg by mouth daily.    . prednisoLONE acetate (PRED FORTE) 1 % ophthalmic suspension Place  1 drop into the left eye 4 (four) times daily.    Marland Kitchen VERZENIO 100 MG tablet TAKE 1 TABLET (100 MG TOTAL) BY MOUTH 2 (TWO) TIMES DAILY. 56 tablet 0    Current Facility-Administered Medications  Medication Dose Route Frequency Provider Last Rate Last Admin  . 0.9 %  sodium chloride infusion  500 mL Intravenous Continuous Nandigam, Venia Minks, MD        REVIEW OF SYSTEMS:   A 10+ POINT REVIEW OF SYSTEMS WAS OBTAINED including neurology, dermatology, psychiatry, cardiac, respiratory, lymph, extremities, GI, GU, Musculoskeletal, constitutional, breasts, reproductive, HEENT.  All pertinent positives are noted in the HPI.  All others are negative.   PHYSICAL EXAMINATION: ECOG FS:1 - Symptomatic but completely ambulatory  Vitals:   11/01/19 1103  BP: 136/86  Pulse: 99  Resp: 18  Temp: (!) 97.5 F (36.4 C)  SpO2: 100%   Wt Readings from Last 3 Encounters:  11/01/19 231 lb (104.8 kg)  10/01/19 231 lb 9.6 oz (105.1 kg)  09/04/19 230 lb 9.6 oz (104.6 kg)   Body mass index is 36.18 kg/m.    GENERAL:alert, in no acute distress and comfortable SKIN: no acute rashes, no significant lesions EYES: conjunctiva are pink and non-injected, sclera anicteric OROPHARYNX: MMM, no exudates, no oropharyngeal erythema or ulceration NECK: supple, no JVD LYMPH:  no palpable lymphadenopathy in the cervical, axillary or inguinal regions LUNGS: clear to auscultation b/l with normal respiratory effort HEART: regular rate & rhythm ABDOMEN:  normoactive bowel sounds , non tender, not distended. Extremity: no pedal edema PSYCH: alert & oriented x 3 with fluent speech NEURO: no focal motor/sensory deficits  LABORATORY DATA:  I have reviewed the data as listed  . CBC Latest Ref Rng & Units 11/01/2019 10/01/2019 09/08/2019  WBC 4.0 - 10.5 K/uL 4.0 6.0 5.1  Hemoglobin 12.0 - 15.0 g/dL 11.1(L) 11.6(L) 11.7(L)  Hematocrit 36.0 - 46.0 % 33.9(L) 35.4(L) 36.2  Platelets 150 - 400 K/uL 242 228 261    . CMP Latest Ref Rng & Units 11/01/2019 10/01/2019 09/08/2019  Glucose 70 - 99 mg/dL 127(H) 96 109(H)  BUN 6 - 20 mg/dL 10 15 12   Creatinine 0.44 - 1.00 mg/dL  1.35(H) 1.13(H) 1.19(H)  Sodium 135 - 145 mmol/L 140 144 139  Potassium 3.5 - 5.1 mmol/L 3.7 3.6 2.9(L)  Chloride 98 - 111 mmol/L 103 105 100  CO2 22 - 32 mmol/L 24 29 28   Calcium 8.9 - 10.3 mg/dL 9.2 8.5(L) 8.5(L)  Total Protein 6.5 - 8.1 g/dL 7.4 7.7 6.9  Total Bilirubin 0.3 - 1.2 mg/dL 0.5 0.4 0.7  Alkaline Phos 38 - 126 U/L 168(H) 180(H) 115  AST 15 - 41 U/L 85(H) 150(H) 34  ALT 0 - 44 U/L 100(H) 188(H) 45(H)   Component     Latest Ref Rng & Units 03/08/2019 03/22/2019  FSH     mIU/mL 65.5   LH     mIU/mL 46.6   Estrogen     pg/mL 82   Progesterone     ng/mL <0.1   Estradiol     pg/mL  <5.0      RADIOGRAPHIC STUDIES: I have personally reviewed the radiological images as listed and agreed with the findings in the report. No results found.  ASSESSMENT & PLAN:   MAILLE HALLIWELL is a 54 y.o. female with:  1. Metastatic breast cancer ER+/PRneg/Her2 neg  02/28/2019 neck CT with results revealing "negative for mass or adenopathy in the neck."  03/04/2019 head MRI  with results revealing "Negative for metastatic disease.  No acute abnormality in the brain."  2. Likely Pulmonary metastases  02/18/2019 chest and abdomen with results revealing "5cm spiculated soft tissue mass in inferior right breast, highly suspicious for primary breast carcinoma. No acute findings or metastatic disease within the abdomen or pelvis. Multiple small pulmonary nodules in both lung bases, consistent with pulmonary metastases. 4.5 cm uterine fibroid."  02/28/2019 C/A/P CT with results revealing "Irregular solid 5.0 cm right breast mass, suspicious for primary right breast malignancy. Innumerable solid pulmonary nodules scattered throughout both lungs, compatible with pulmonary metastases. No evidence of metastatic disease in the abdomen, pelvis or skeleton. Mildly enlarged and probably myomatous uterus. Simple 1.4 cm left adnexal cyst requires no follow-up. This recommendation follows ACR  consensus guidelines: White Paper of the ACR Incidental Findings Committee II on Adnexal Findings. J Am Coll Radiol [phone]. Aortic Atherosclerosis (ICD10-I70.0)."  NUCLEAR MEDICINE WHOLE BODY BONE SCAN completed on 03/19/2019 with results revealing "1. No scintigraphic evidence skeletal metastasis. 2. Degenerative bone disease in the posterior elements of the upper and mid lumbar spine."   04/09/2019 Bone Density ([id number]) which revealed "The BMD measured at Femur Neck Right is 1.153 g/cm2 with a T-score of 0.8. This patient is considered normal according to [facility]) criteria. Lumbar spine was not utilized due to advanced degenerative changes. The scan quality is good. Femur Neck Right 04/09/2019 54.7 Normal 0.8 1.153 g/cm2. Left Forearm Radius 33% 04/09/2019 54.7 Normal 0.8 0.949 g/cm2."  08/04/2019 CT Angio Chest ([id number]) revealed "1. No lobar or central pulmonary embolus detected. Exam is limited secondary to respiratory motion. 2. Mild ground-glass attenuation may represent mild pneumonitis or areas of air trapping. 3. Signs of atrial septal closure. 4. Decrease in size and number of bilateral pulmonary nodules, marked response noted on today's exam with the only nodule remaining near a cm in the right upper lobe and the smaller nodules that were present on the previous examination throughout the chest no longer measurable though the lower lobes are limited by respiratory motion. 5. Decreased size of right breast mass. 6. Probable hepatic steatosis."  3. Abnormal Liver function test -- previous? Fatty liver 07/03/2019 [place] Abd ([id number]) revealed "1. No acute findings. Normal gallbladder. No bile duct dilation. 2. Significant increased liver parenchymal echogenicity consistent with extensive hepatic steatosis."  PLAN: -Discussed pt labwork today, 11/01/19; of CBC w/diff and CMP is as follows: all values are WNL except for RBC at 3.32, Hemoglobin at 11.1, HCT at  33.9, MCV at 102.1, Neutro abs at 0.9K, Glucose at 127, Creatinine at 1.35, Albumin at 3.3, AST at 85, ALT at 100, Alkaline Phosphatase at 168, GFR, Est Non Af Am at 44, GFR, Est AFR Am at 51 -Discussed 11/01/19 of Magnesium at 1.3 -Elevated enzymes could be due to recent infection or Doxycycline + Fatty Liver  -Advised pt that there are no contraindications to her taking Vitamin B12 at this time -Advised pt to continue to take safety precautions when going to public spaces -Recommend pt wear loose-fitting clothing and keep her groin area dry  -Advised on diarrhea  -Advised on changing medication off of Verzenio to [place] on SOB -Advised on asthma, COPD, or mild antibiotics could cause SOB -Advised magnesium supplement can cause diarrhea -Advised eating dry fruits to help with magnesium level -Advised on getting COVID19 test  -Advised on IV magnesium  -Pt is allergic to Z-Pak -Recommends holding off on Verzenio  -Will get stool testing  -Will get  repeat Chest CT and Mammogram -Will get chest x-ray today   -Continue f/u with PCP -Continue taking magnesium supplement  -Transfer to ED for progressive SOB , severe diarrhea and failure to thrive. Concern for pneumonia -Phone visit with Dr Irene Limbo in 2 weeks  FOLLOW UP: -Transfer to ED for progressive SOB , severe diarrhea and failure to thrive. Concern for pneumonia -Phone visit with Dr Irene Limbo in 2 weeks   The total time spent in the appt was 30 minutes and more than 50% was on counseling and direct patient cares.  All of the patient's questions were answered with apparent satisfaction. The patient knows to call the clinic with any problems, questions or concerns.  Sullivan Lone MD [place] AAHIVMS [facility] Hematology/Oncology Physician [facility]  (Office):       [phone] (Work cell):  [phone] (Fax):           [phone]  11/01/2019 12:20 PM  I, Dawayne Cirri am acting as a Education administrator for Dr. Sullivan Lone.    .I have reviewed the above documentation for accuracy and completeness, and I agree with the above.  Brunetta Genera MD

## 2019-11-01 ENCOUNTER — Encounter: Payer: Self-pay | Admitting: Hematology

## 2019-11-01 ENCOUNTER — Other Ambulatory Visit: Payer: Self-pay

## 2019-11-01 ENCOUNTER — Inpatient Hospital Stay (HOSPITAL_BASED_OUTPATIENT_CLINIC_OR_DEPARTMENT_OTHER): Payer: BC Managed Care – PPO | Admitting: Hematology

## 2019-11-01 ENCOUNTER — Emergency Department (HOSPITAL_COMMUNITY): Payer: BC Managed Care – PPO

## 2019-11-01 ENCOUNTER — Emergency Department (HOSPITAL_COMMUNITY)
Admission: EM | Admit: 2019-11-01 | Discharge: 2019-11-01 | Disposition: A | Discharge status: Home / Self Care | Payer: BC Managed Care – PPO | Source: Home / Self Care | Attending: Emergency Medicine | Admitting: Emergency Medicine

## 2019-11-01 ENCOUNTER — Inpatient Hospital Stay: Payer: BC Managed Care – PPO | Attending: Hematology

## 2019-11-01 ENCOUNTER — Encounter (HOSPITAL_COMMUNITY): Payer: Self-pay | Admitting: Emergency Medicine

## 2019-11-01 VITALS — BP 136/86 | HR 99 | Temp 97.5°F | Resp 18 | Ht 67.0 in | Wt 231.0 lb

## 2019-11-01 DIAGNOSIS — R110 Nausea: Secondary | ICD-10-CM | POA: Insufficient documentation

## 2019-11-01 DIAGNOSIS — R945 Abnormal results of liver function studies: Secondary | ICD-10-CM

## 2019-11-01 DIAGNOSIS — R05 Cough: Secondary | ICD-10-CM | POA: Diagnosis not present

## 2019-11-01 DIAGNOSIS — C50919 Malignant neoplasm of unspecified site of unspecified female breast: Secondary | ICD-10-CM | POA: Diagnosis not present

## 2019-11-01 DIAGNOSIS — N183 Chronic kidney disease, stage 3 unspecified: Secondary | ICD-10-CM | POA: Insufficient documentation

## 2019-11-01 DIAGNOSIS — J441 Chronic obstructive pulmonary disease with (acute) exacerbation: Secondary | ICD-10-CM | POA: Insufficient documentation

## 2019-11-01 DIAGNOSIS — R0602 Shortness of breath: Secondary | ICD-10-CM | POA: Diagnosis not present

## 2019-11-01 DIAGNOSIS — R627 Adult failure to thrive: Secondary | ICD-10-CM

## 2019-11-01 DIAGNOSIS — Z8774 Personal history of (corrected) congenital malformations of heart and circulatory system: Secondary | ICD-10-CM | POA: Insufficient documentation

## 2019-11-01 DIAGNOSIS — F17210 Nicotine dependence, cigarettes, uncomplicated: Secondary | ICD-10-CM | POA: Insufficient documentation

## 2019-11-01 DIAGNOSIS — R509 Fever, unspecified: Secondary | ICD-10-CM | POA: Diagnosis not present

## 2019-11-01 DIAGNOSIS — M25552 Pain in left hip: Secondary | ICD-10-CM | POA: Diagnosis not present

## 2019-11-01 DIAGNOSIS — C7801 Secondary malignant neoplasm of right lung: Secondary | ICD-10-CM | POA: Insufficient documentation

## 2019-11-01 DIAGNOSIS — Z6836 Body mass index (BMI) 36.0-36.9, adult: Secondary | ICD-10-CM | POA: Insufficient documentation

## 2019-11-01 DIAGNOSIS — R6889 Other general symptoms and signs: Secondary | ICD-10-CM | POA: Insufficient documentation

## 2019-11-01 DIAGNOSIS — R197 Diarrhea, unspecified: Secondary | ICD-10-CM

## 2019-11-01 DIAGNOSIS — R5383 Other fatigue: Secondary | ICD-10-CM | POA: Insufficient documentation

## 2019-11-01 DIAGNOSIS — R7989 Other specified abnormal findings of blood chemistry: Secondary | ICD-10-CM

## 2019-11-01 DIAGNOSIS — C7802 Secondary malignant neoplasm of left lung: Secondary | ICD-10-CM | POA: Insufficient documentation

## 2019-11-01 DIAGNOSIS — R0609 Other forms of dyspnea: Secondary | ICD-10-CM

## 2019-11-01 DIAGNOSIS — E669 Obesity, unspecified: Secondary | ICD-10-CM | POA: Insufficient documentation

## 2019-11-01 DIAGNOSIS — M1990 Unspecified osteoarthritis, unspecified site: Secondary | ICD-10-CM | POA: Insufficient documentation

## 2019-11-01 DIAGNOSIS — Z79899 Other long term (current) drug therapy: Secondary | ICD-10-CM | POA: Insufficient documentation

## 2019-11-01 DIAGNOSIS — M25551 Pain in right hip: Secondary | ICD-10-CM | POA: Diagnosis not present

## 2019-11-01 DIAGNOSIS — M47816 Spondylosis without myelopathy or radiculopathy, lumbar region: Secondary | ICD-10-CM | POA: Diagnosis not present

## 2019-11-01 DIAGNOSIS — J449 Chronic obstructive pulmonary disease, unspecified: Secondary | ICD-10-CM | POA: Insufficient documentation

## 2019-11-01 DIAGNOSIS — C50911 Malignant neoplasm of unspecified site of right female breast: Secondary | ICD-10-CM | POA: Insufficient documentation

## 2019-11-01 DIAGNOSIS — K760 Fatty (change of) liver, not elsewhere classified: Secondary | ICD-10-CM | POA: Insufficient documentation

## 2019-11-01 DIAGNOSIS — I700 Atherosclerosis of aorta: Secondary | ICD-10-CM | POA: Diagnosis not present

## 2019-11-01 DIAGNOSIS — Z853 Personal history of malignant neoplasm of breast: Secondary | ICD-10-CM | POA: Insufficient documentation

## 2019-11-01 DIAGNOSIS — M5136 Other intervertebral disc degeneration, lumbar region: Secondary | ICD-10-CM | POA: Insufficient documentation

## 2019-11-01 DIAGNOSIS — Z20822 Contact with and (suspected) exposure to covid-19: Secondary | ICD-10-CM | POA: Insufficient documentation

## 2019-11-01 DIAGNOSIS — R600 Localized edema: Secondary | ICD-10-CM | POA: Insufficient documentation

## 2019-11-01 DIAGNOSIS — Z881 Allergy status to other antibiotic agents status: Secondary | ICD-10-CM | POA: Insufficient documentation

## 2019-11-01 LAB — CBC WITH DIFFERENTIAL/PLATELET
Abs Immature Granulocytes: 0.01 10*3/uL (ref 0.00–0.07)
Basophils Absolute: 0 10*3/uL (ref 0.0–0.1)
Basophils Relative: 1 %
Eosinophils Absolute: 0 10*3/uL (ref 0.0–0.5)
Eosinophils Relative: 1 %
HCT: 33.9 % — ABNORMAL LOW (ref 36.0–46.0)
Hemoglobin: 11.1 g/dL — ABNORMAL LOW (ref 12.0–15.0)
Immature Granulocytes: 0 %
Lymphocytes Relative: 69 %
Lymphs Abs: 2.8 10*3/uL (ref 0.7–4.0)
MCH: 33.4 pg (ref 26.0–34.0)
MCHC: 32.7 g/dL (ref 30.0–36.0)
MCV: 102.1 fL — ABNORMAL HIGH (ref 80.0–100.0)
Monocytes Absolute: 0.2 10*3/uL (ref 0.1–1.0)
Monocytes Relative: 5 %
Neutro Abs: 0.9 10*3/uL — ABNORMAL LOW (ref 1.7–7.7)
Neutrophils Relative %: 24 %
Platelets: 242 10*3/uL (ref 150–400)
RBC: 3.32 MIL/uL — ABNORMAL LOW (ref 3.87–5.11)
RDW: 13.9 % (ref 11.5–15.5)
WBC: 4 10*3/uL (ref 4.0–10.5)
nRBC: 0 % (ref 0.0–0.2)

## 2019-11-01 LAB — BRAIN NATRIURETIC PEPTIDE: B Natriuretic Peptide: 7.5 pg/mL (ref 0.0–100.0)

## 2019-11-01 LAB — CMP (CANCER CENTER ONLY)
ALT: 100 U/L — ABNORMAL HIGH (ref 0–44)
AST: 85 U/L — ABNORMAL HIGH (ref 15–41)
Albumin: 3.3 g/dL — ABNORMAL LOW (ref 3.5–5.0)
Alkaline Phosphatase: 168 U/L — ABNORMAL HIGH (ref 38–126)
Anion gap: 13 (ref 5–15)
BUN: 10 mg/dL (ref 6–20)
CO2: 24 mmol/L (ref 22–32)
Calcium: 9.2 mg/dL (ref 8.9–10.3)
Chloride: 103 mmol/L (ref 98–111)
Creatinine: 1.35 mg/dL — ABNORMAL HIGH (ref 0.44–1.00)
GFR, Est AFR Am: 51 mL/min — ABNORMAL LOW (ref >60)
GFR, Estimated: 44 mL/min — ABNORMAL LOW (ref >60)
Glucose, Bld: 127 mg/dL — ABNORMAL HIGH (ref 70–99)
Potassium: 3.7 mmol/L (ref 3.5–5.1)
Sodium: 140 mmol/L (ref 135–145)
Total Bilirubin: 0.5 mg/dL (ref 0.3–1.2)
Total Protein: 7.4 g/dL (ref 6.5–8.1)

## 2019-11-01 LAB — PROTIME-INR
INR: 1 (ref 0.8–1.2)
Prothrombin Time: 12.3 seconds (ref 11.4–15.2)

## 2019-11-01 LAB — TROPONIN I (HIGH SENSITIVITY)
Troponin I (High Sensitivity): 3 ng/L (ref <18)
Troponin I (High Sensitivity): 4 ng/L (ref <18)

## 2019-11-01 LAB — SARS CORONAVIRUS 2 BY RT PCR (HOSPITAL ORDER, PERFORMED IN ~~LOC~~ HOSPITAL LAB): SARS Coronavirus 2: NEGATIVE

## 2019-11-01 LAB — LACTIC ACID, PLASMA
Lactic Acid, Venous: 1.8 mmol/L (ref 0.5–1.9)
Lactic Acid, Venous: 2.1 mmol/L (ref 0.5–1.9)

## 2019-11-01 LAB — MAGNESIUM
Magnesium: 1.3 mg/dL — CL (ref 1.7–2.4)
Magnesium: 1.5 mg/dL — ABNORMAL LOW (ref 1.7–2.4)

## 2019-11-01 MED ORDER — MAGNESIUM SULFATE 2 GM/50ML IV SOLN
2.0000 g | Freq: Once | INTRAVENOUS | Status: AC
  Administered 2019-11-01: 2 g via INTRAVENOUS
  Filled 2019-11-01: qty 50

## 2019-11-01 MED ORDER — LACTATED RINGERS IV BOLUS
1000.0000 mL | Freq: Once | INTRAVENOUS | Status: AC
  Administered 2019-11-01: 1000 mL via INTRAVENOUS

## 2019-11-01 MED ORDER — IOHEXOL 350 MG/ML SOLN
100.0000 mL | Freq: Once | INTRAVENOUS | Status: AC | PRN
  Administered 2019-11-01: 100 mL via INTRAVENOUS

## 2019-11-01 MED ORDER — METHYLPREDNISOLONE SODIUM SUCC 125 MG IJ SOLR
125.0000 mg | Freq: Once | INTRAMUSCULAR | Status: AC
  Administered 2019-11-01: 125 mg via INTRAVENOUS
  Filled 2019-11-01: qty 2

## 2019-11-01 MED ORDER — DOXYCYCLINE HYCLATE 100 MG PO TABS
100.0000 mg | ORAL_TABLET | Freq: Once | ORAL | Status: AC
  Administered 2019-11-01: 100 mg via ORAL
  Filled 2019-11-01: qty 1

## 2019-11-01 MED ORDER — SODIUM CHLORIDE 0.9 % IV BOLUS: 1000.0000 mL | Freq: Once | INTRAVENOUS | Status: DC

## 2019-11-01 MED ORDER — ALBUTEROL SULFATE (2.5 MG/3ML) 0.083% IN NEBU
2.5000 mg | INHALATION_SOLUTION | Freq: Four times a day (QID) | RESPIRATORY_TRACT | 2 refills | Status: DC | PRN
Start: 2019-11-01 — End: 2019-12-25

## 2019-11-01 NOTE — Discharge Instructions (Addendum)
It was our pleasure to provide your ER care today - we hope that you feel better.  Rest. Drink plenty of fluids. Avoid any smoking. Use albuterol as need if wheezing.   Follow up with your doctor in the coming week.  Return to ER if worse, new symptoms, fevers, chest pain, increased trouble breathing, or other concern.

## 2019-11-01 NOTE — ED Provider Notes (Signed)
[place] DEPT Provider Note   CSN: [id number] Arrival date & time: 11/01/19  1340     History Chief Complaint  Patient presents with  . Shortness of Breath    Brandy [name] is a 54 y.o. female.  HPI     54 year old female comes in a chief complaint of shortness of breath.  Patient has history of GERD, ASD and breast cancer with mets to the lungs.  Patient reports that she has been having severe diarrhea for the last several weeks.  Patient is also having worsening shortness of breath.  She has shortness of breath with minimal exertion now.  Patient has no new cough.  She also denies any fevers, chills.  Diarrhea is not bloody.  Cough is not bloody.   Past Medical History:  Diagnosis Date  . Arthritis   . ASD (atrial septal defect)    s/p closure with Amplatzer device 10/05/04 (Dr. Myriam Jacobson, [facility]) 10/05/04  . Cataract   . GERD (gastroesophageal reflux disease)   . Headache   . Heart murmur    no longer heard  . Legally blind in right eye, as defined in [place]   . Lumbar herniated disc   . Sciatica     Patient Active Problem List   Diagnosis Date Noted  . Diarrhea 09/04/2019  . Hepatic steatosis 09/04/2019  . Sepsis ([place]) 09/04/2019  . Nausea & vomiting 09/04/2019  . Obesity (BMI 30-39.9) 09/04/2019  . GERD (gastroesophageal reflux disease)   . Hyperlipidemia   . CKD (chronic kidney disease), stage III   . Depression   . Malignant hypertension   . Transaminitis   . Lactic acidosis   . Pneumonitis 08/04/2019  . Breast cancer ([place]) 08/04/2019  . Tachycardia   . Acute pansinusitis 08/02/2017  . Insomnia 08/02/2017  . Low back pain 08/02/2017  . Migraine 08/02/2017  . Neuropathy 08/02/2017  . 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 Left shoulder tendonitis 05/23/2016    Class: Acute  . Anxiety disorder 01/07/2015    Past Surgical History:  Procedure Laterality  Date  . ABLATION    . BREAST SURGERY Bilateral 2011   Breast Reduction Surgery  . BUNIONECTOMY    . CARDIAC CATHETERIZATION     10/05/04 [facility]): LM < 25%, otherwise normal coronaries. No pulmonary HTN, Mildly enlarged RV. Secundum ASD s/p closure.  Marland Kitchen CARDIAC SURGERY    . CATARACT EXTRACTION    . CLEFT PALATE REPAIR     s/p cleft lip and palate repair  . LUMBAR LAMINECTOMY/DECOMPRESSION MICRODISCECTOMY Left 05/23/2016   Procedure: LEFT L4-L5 LATERAL RECESS DECOMPRESSION WITH CENTRAL AND RIGHT DECOMPRESSION VIA LEFT SIDE;  Surgeon: [name], MD;  Location: [place];  Service: Orthopedics;  Laterality: Left;  . LUMBAR LAMINECTOMY/DECOMPRESSION MICRODISCECTOMY Left 05/23/2016   Procedure: LUMBAR LAMINECTOMY/DECOMPRESSION MICRODISCECTOMY Lumbar five - Sacral One 1 LEVEL;  Surgeon: [name], MD;  Location: [place];  Service: Orthopedics;  Laterality: Left;  . SHOULDER INJECTION Left 05/23/2016   Procedure: SHOULDER INJECTION;  Surgeon: [name], MD;  Location: [place];  Service: Orthopedics;  Laterality: Left;  band-aid per pa-c  . TRANSTHORACIC ECHOCARDIOGRAM     12/15/05 [facility]): Mild LVH, EF > [id number], grade 1 diastolic dysfunction, Trivial MR/PR/TR.     OB History   No obstetric history on file.     Family History  Problem Relation Age of Onset  . Diabetes Mother   .  High blood pressure Mother   . Cancer Father        Lung  . Colon cancer Neg Hx   . Colon polyps Neg Hx   . Esophageal cancer Neg Hx   . Rectal cancer Neg Hx   . Stomach cancer Neg Hx     Social History   Tobacco Use  . Smoking status: Current Every Day Smoker    Packs/day: 0.50    Types: Cigarettes  . Smokeless tobacco: Never Used  Substance Use Topics  . Alcohol use: No  . Drug use: No    Home Medications Prior to Admission medications   Medication Sig Start Date End Date Taking? Authorizing Provider  albuterol (VENTOLIN HFA) 108 (90 Base) MCG/ACT inhaler Inhale 2 puffs into the lungs every 6 (six)  hours as needed for wheezing or shortness of breath. 06/06/19  Yes Tanner, Lyndon Code., PA-C  ALPRAZolam [name]) 1 MG tablet Take 1 mg by mouth daily as needed for anxiety.  02/14/19  Yes [provider]  amitriptyline (ELAVIL) 25 MG tablet Take 3 tablets (75 mg total) by mouth at bedtime. 09/08/19  Yes Little Ishikawa, MD  Cyanocobalamin (VITAMIN B 12 PO) Take 1 tablet by mouth daily.   Yes [provider]  ergocalciferol (VITAMIN D2) 1.25 MG (50000 UT) capsule Take 1 capsule (50,000 Units total) by mouth once a week. 03/08/19  Yes Brunetta Genera, MD  fluticasone (FLOVENT HFA) 110 MCG/ACT inhaler Inhale 2 puffs into the lungs 2 (two) times daily. 06/06/19  Yes Tanner, Lyndon Code., PA-C  guaiFENesin (MUCINEX) 600 MG 12 hr tablet Take 1 tablet (600 mg total) by mouth 2 (two) times daily. 08/05/19 08/04/20 Yes Georgette Shell, MD  guaiFENesin-dextromethorphan [facility] DM) 100-10 MG/5ML syrup Take 10 mLs by mouth every 4 (four) hours as needed for cough. 08/05/19  Yes Georgette Shell, MD  HYDROcodone-acetaminophen (NORCO/VICODIN) 5-325 MG tablet Take 1-2 tablets by mouth every 6 (six) hours as needed for moderate pain.   Yes [provider]  letrozole ([place]) 2.5 MG tablet TAKE 1 TABLET BY MOUTH ONCE DAILY Patient taking differently: Take 2.5 mg by mouth daily.  09/16/19  Yes Brunetta Genera, MD  Magnesium 500 MG TABS Take 1 tablet (500 mg total) by mouth in the morning and at bedtime. 08/21/19  Yes Brunetta Genera, MD  omeprazole (PRILOSEC) 10 MG capsule Take 20 mg by mouth daily.    Yes [provider]  ondansetron (ZOFRAN) 8 MG tablet Take 1 tablet (8 mg total) by mouth every 8 (eight) hours as needed for nausea or vomiting. 03/26/19  Yes Brunetta Genera, MD  prednisoLONE acetate (PRED FORTE) 1 % ophthalmic suspension Place 1 drop into the left eye 4 (four) times daily. 10/09/19  Yes [provider]  VERZENIO 100 MG tablet TAKE 1 TABLET (100  MG TOTAL) BY MOUTH 2 (TWO) TIMES DAILY. Patient taking differently: Take 100 mg by mouth 2 (two) times daily.  10/29/19  Yes Brunetta Genera, MD  atorvastatin (LIPITOR) 10 MG tablet Take 10 mg by mouth daily.    [provider]  benzonatate (TESSALON) 100 MG capsule Take 1 capsule (100 mg total) by mouth 3 (three) times daily as needed for cough. Patient not taking: Reported on 10/01/2019 04/22/19   Brunetta Genera, MD  Ferrous Sulfate (IRON PO) Take 1 tablet by mouth daily.    [provider]  HYDROcodone-homatropine (HYCODAN) 5-1.5 MG/5ML syrup Take 5 mLs by mouth  every 6 (six) hours as needed for cough. Patient not taking: Reported on 10/01/2019 08/05/19   Georgette Shell, MD  PARoxetine (PAXIL) 10 MG tablet Take 10 mg by mouth daily.    [provider]    Allergies    Zithromax [azithromycin] and Psyllium  Review of Systems   Review of Systems  Constitutional: Positive for activity change.  Respiratory: Positive for cough and shortness of breath.   Cardiovascular: Negative for chest pain.  Gastrointestinal: Negative for blood in stool, nausea and vomiting.  Allergic/Immunologic: Positive for immunocompromised state.  Hematological: Does not bruise/bleed easily.  All other systems reviewed and are negative.   Physical Exam Updated Vital Signs BP (!) 131/111   Pulse 84   Temp 98.2 F (36.8 C) (Oral)   Resp 14   Ht 5\' 7"  (1.702 m)   Wt 104.8 kg   SpO2 99%   BMI 36.18 kg/m   Physical Exam Vitals and nursing note reviewed.  Constitutional:      Appearance: She is well-developed.  HENT:     Head: Normocephalic and atraumatic.  Cardiovascular:     Rate and Rhythm: Normal rate.  Pulmonary:     Effort: Pulmonary effort is normal.     Breath sounds: No decreased breath sounds, wheezing, rhonchi or rales.  Abdominal:     General: Bowel sounds are normal.  Musculoskeletal:     Cervical back: Normal range of motion and neck supple.    Skin:    General: Skin is warm and dry.  Neurological:     Mental Status: She is alert and oriented to person, place, and time.     ED Results / Procedures / Treatments   Labs (all labs ordered are listed, but only abnormal results are displayed) Labs Reviewed  MAGNESIUM - Abnormal; Notable for the following components:      Result Value   Magnesium 1.5 (*)    All other components within normal limits  GASTROINTESTINAL PANEL BY PCR, STOOL (REPLACES STOOL CULTURE)  SARS CORONAVIRUS 2 BY RT PCR (HOSPITAL ORDER, [place] [facility])  PROTIME-INR  LACTIC ACID, PLASMA  BRAIN NATRIURETIC PEPTIDE  LACTIC ACID, PLASMA  TROPONIN I (HIGH SENSITIVITY)  TROPONIN I (HIGH SENSITIVITY)    EKG EKG Interpretation  Date/Time:  Friday Nov 01 2019 13:50:30 EDT Ventricular Rate:  80 PR Interval:    QRS Duration: 83 QT Interval:  399 QTC Calculation: 461 R Axis:   68 Text Interpretation: Sinus rhythm No acute changes No significant change since last tracing Confirmed by Varney Biles [phone]) on 11/01/2019 2:36:39 PM   Radiology DG Chest 2 View  Result Date: 11/01/2019 CLINICAL DATA:  Cough and short of breath.  History of cancer. EXAM: CHEST - 2 VIEW COMPARISON:  09/07/2019 FINDINGS: Heart size normal. Atrial septal defect closure device unchanged. Pulmonary vascularity normal. Lungs are clear without infiltrate effusion or mass. IMPRESSION: No active cardiopulmonary disease. Electronically Signed   By: Franchot Gallo M.D.   On: 11/01/2019 16:41   CT Angio Chest PE W and/or Wo Contrast  Result Date: 11/01/2019 CLINICAL DATA:  54 year old female with shortness of breath. History of breast and lung cancer. EXAM: CT ANGIOGRAPHY CHEST WITH CONTRAST TECHNIQUE: Multidetector CT imaging of the chest was performed using the standard protocol during bolus administration of intravenous contrast. Multiplanar CT image reconstructions and MIPs were obtained to evaluate the vascular  anatomy. CONTRAST:  146mL OMNIPAQUE IOHEXOL 350 MG/ML SOLN COMPARISON:  Chest radiograph dated 08/04/2019. FINDINGS:  Cardiovascular: There is no cardiomegaly or pericardial effusion. And atrial septal defect occlusive device noted. The thoracic aorta is unremarkable. The origins of the great vessels of the aortic arch appear patent as visualized. Mild dilatation of the main pulmonary trunk suggestive of a degree of pulmonary hypertension. Evaluation of the pulmonary arteries is somewhat limited due to respiratory motion artifact. No pulmonary artery embolus identified. Mediastinum/Nodes: There is no hilar or mediastinal adenopathy. The esophagus and thyroid gland are grossly unremarkable. No mediastinal fluid collection. Lungs/Pleura: No significant interval change in the right upper lobe nodule measuring approximately 10 x 12 mm. Additional 5 mm nodule in the right lung (49/6) is also similar to prior CT. A 4 mm left apical nodule is also stable. There is a 4 mm nodule in the right lower lobe (58/6) which may be new or more conspicuous compared to the prior CT. Diffuse patchy hazy density throughout the lungs with diffuse mosaic appearance may represent air trapping or related to underlying small airways versus small vessel disease. No focal consolidation, pleural effusion, or pneumothorax. Upper Abdomen: Probable mild fatty liver. The visualized upper abdomen is otherwise unremarkable. Musculoskeletal: Degenerative changes of the spine. No acute osseous pathology. No significant interval change in the appearance of the soft tissue mass in the right breast. Review of the MIP images confirms the above findings. IMPRESSION: 1. No CT evidence of pulmonary embolism. 2. No significant interval change in the bilateral pulmonary nodules as above. A 4 mm right lower lobe pulmonary nodule may be new or more conspicuous compared to the prior CT. Attention on follow-up imaging recommended. 3. Diffuse patchy hazy density  throughout the lungs with diffuse mosaic appearance may represent air trapping or related to underlying small airways versus small vessel disease. Electronically Signed   By: Anner Crete M.D.   On: 11/01/2019 16:49    Procedures Procedures (including critical care time)  Medications Ordered in ED Medications  magnesium sulfate IVPB 2 g 50 mL (2 g Intravenous New Bag/Given 11/01/19 1618)  lactated ringers bolus 1,000 mL (1,000 mLs Intravenous New Bag/Given 11/01/19 1617)  iohexol (OMNIPAQUE) 350 MG/ML injection 100 mL (100 mLs Intravenous Contrast Given 11/01/19 1604)    ED Course  I have reviewed the triage vital signs and the nursing notes.  Pertinent labs & imaging results that were available during my care of the patient were reviewed by me and considered in my medical decision making (see chart for details).    MDM Rules/Calculators/A&P                      SHONTERRIA BROXSON was evaluated in Emergency Department on 11/01/2019 for the symptoms described in the history of present illness. She was evaluated in the context of the global COVID-19 pandemic, which necessitated consideration that the patient might be at risk for infection with the SARS-CoV-2 virus that causes COVID-19. Institutional protocols and algorithms that pertain to the evaluation of patients at risk for COVID-19 are in a state of rapid change based on information released by regulatory bodies including the CDC and federal and state organizations. These policies and algorithms were followed during the patient's care in the ED.   54 year old comes in a chief complaint of shortness of breath.  She has known history of metastatic breast cancer with mets to the lungs.  Patient is getting oral chemo.  She also has COPD.  She reports worsening shortness of breath with exertion.  She is also having diarrhea.  She was seen at the cancer center today and Dr. Claiborne Billings advised her to come to the ER for further work-up.  CT scan  was ordered and it does not reveal any evidence of blood clot.  Patient is having some orthopnea-like symptoms but she has no known history of CHF.  BNP is ordered and pending at this time.  No signs of pitting edema.  CT scan ruled out PE or underlying pneumonia.  She does have some diffuse lung scarring, Covid test is still pending at this time.  Patient's magnesium was replenished.  Patient reassessed after CT scan.  I discussed the findings with Dr. Irene Limbo as well.  Patient does not need any emergent admission from a cancer perspective per oncology.  However if patient is having difficulty functioning at home, that it might be worthwhile admitting her for failure to thrive and COPD exacerbation.  Patient is not comfortable going home.  She is amenable to observation type stay.  Final Clinical Impression(s) / ED Diagnoses Final diagnoses:  COPD exacerbation ([place])  Hypomagnesemia  Diarrhea, unspecified type  Failure to thrive in adult    Rx / DC Orders ED Discharge Orders    None       Varney Biles, MD 11/01/19 1727

## 2019-11-01 NOTE — ED Notes (Signed)
Will start medication following pts scan.

## 2019-11-01 NOTE — ED Provider Notes (Addendum)
Signed out by Dr Kathrynn Humble to check labs and disposition, indicating CTA neg for PE.   Labs resulted. covid is negative.   Covid is negative. Brandy [name] was evaluated in Emergency Department on 11/01/2019 for the symptoms described in the history of present illness. She was evaluated in the context of the global COVID-19 pandemic, which necessitated consideration that the patient might be at risk for infection with the SARS-CoV-2 virus that causes COVID-19. Institutional protocols and algorithms that pertain to the evaluation of patients at risk for COVID-19 are in a state of rapid change based on information released by regulatory bodies including the CDC and federal and state organizations. These policies and algorithms were followed during the patient's care in the ED.  On recheck, pt has no increased wob, or dyspnea. Her room air pulse ox is 97%. No current or recent chest pain or discomfort. Pt is not coughing, and is afebrile.   Pt currently appears stable for d/c.   Rec pcp/onc f/u. Return precautions provided.       Brandy Saver, MD 11/01/19 1904

## 2019-11-01 NOTE — ED Triage Notes (Signed)
Patient arrived from cancer center with staff. Patient c/o SOB upon exertion.   Patient has history of COPD.   Patient is being treated for metastatic breast cancer.

## 2019-11-01 NOTE — ED Notes (Signed)
Per cancer center, patient is SOB upon exertion, fatigued-oncologist wants her to be evaluated in ED

## 2019-11-01 NOTE — Progress Notes (Signed)
Per Dr. Irene Limbo: patient transported to the ED due to increased SOB, Fatigue, Uncontrolled diarrhea. Bedside report given to nurse Ngozi.

## 2019-11-01 NOTE — Progress Notes (Signed)
CRITICAL VALUE STICKER  CRITICAL VALUE: Magnesium 1.3  RECEIVER (on-site recipient of call): Jobe Igo, [place] NOTIFIED: 11/01/19 @ 1142  MESSENGER (representative from lab): Verdis Frederickson  MD NOTIFIED: Dr. Fabienne Bruns  TIME OF NOTIFICATION: [id number]  RESPONSE: MD to address.

## 2019-11-03 ENCOUNTER — Encounter: Payer: Self-pay | Admitting: Hematology

## 2019-11-08 NOTE — Progress Notes (Deleted)
Did not keep appointment.

## 2019-11-11 ENCOUNTER — Ambulatory Visit: Payer: BC Managed Care – PPO | Admitting: Pain Medicine

## 2019-11-11 DIAGNOSIS — G894 Chronic pain syndrome: Secondary | ICD-10-CM | POA: Insufficient documentation

## 2019-11-11 DIAGNOSIS — M4310 Spondylolisthesis, site unspecified: Secondary | ICD-10-CM | POA: Insufficient documentation

## 2019-11-11 DIAGNOSIS — M5137 Other intervertebral disc degeneration, lumbosacral region: Secondary | ICD-10-CM | POA: Insufficient documentation

## 2019-11-11 DIAGNOSIS — M79604 Pain in right leg: Secondary | ICD-10-CM | POA: Insufficient documentation

## 2019-11-11 DIAGNOSIS — R937 Abnormal findings on diagnostic imaging of other parts of musculoskeletal system: Secondary | ICD-10-CM | POA: Insufficient documentation

## 2019-11-11 DIAGNOSIS — M792 Neuralgia and neuritis, unspecified: Secondary | ICD-10-CM | POA: Insufficient documentation

## 2019-11-11 DIAGNOSIS — M899 Disorder of bone, unspecified: Secondary | ICD-10-CM | POA: Insufficient documentation

## 2019-11-11 DIAGNOSIS — Z789 Other specified health status: Secondary | ICD-10-CM | POA: Insufficient documentation

## 2019-11-11 DIAGNOSIS — M5441 Lumbago with sciatica, right side: Secondary | ICD-10-CM | POA: Insufficient documentation

## 2019-11-11 DIAGNOSIS — M47816 Spondylosis without myelopathy or radiculopathy, lumbar region: Secondary | ICD-10-CM | POA: Insufficient documentation

## 2019-11-11 DIAGNOSIS — M7918 Myalgia, other site: Secondary | ICD-10-CM | POA: Insufficient documentation

## 2019-11-11 DIAGNOSIS — G8929 Other chronic pain: Secondary | ICD-10-CM | POA: Insufficient documentation

## 2019-11-11 DIAGNOSIS — Z79899 Other long term (current) drug therapy: Secondary | ICD-10-CM | POA: Insufficient documentation

## 2019-11-19 ENCOUNTER — Telehealth: Payer: Self-pay | Admitting: Hematology

## 2019-11-19 NOTE — Telephone Encounter (Signed)
Scheduled per providers nurse approval, patient has been called and notified of upcoming appointment.

## 2019-11-20 ENCOUNTER — Inpatient Hospital Stay: Payer: BC Managed Care – PPO | Attending: Hematology | Admitting: Hematology

## 2019-11-20 DIAGNOSIS — I700 Atherosclerosis of aorta: Secondary | ICD-10-CM | POA: Insufficient documentation

## 2019-11-20 DIAGNOSIS — Z801 Family history of malignant neoplasm of trachea, bronchus and lung: Secondary | ICD-10-CM | POA: Insufficient documentation

## 2019-11-20 DIAGNOSIS — F17210 Nicotine dependence, cigarettes, uncomplicated: Secondary | ICD-10-CM | POA: Diagnosis not present

## 2019-11-20 DIAGNOSIS — Z79899 Other long term (current) drug therapy: Secondary | ICD-10-CM | POA: Diagnosis not present

## 2019-11-20 DIAGNOSIS — C7802 Secondary malignant neoplasm of left lung: Secondary | ICD-10-CM | POA: Diagnosis present

## 2019-11-20 DIAGNOSIS — Z833 Family history of diabetes mellitus: Secondary | ICD-10-CM | POA: Insufficient documentation

## 2019-11-20 DIAGNOSIS — C50911 Malignant neoplasm of unspecified site of right female breast: Secondary | ICD-10-CM | POA: Insufficient documentation

## 2019-11-20 DIAGNOSIS — K760 Fatty (change of) liver, not elsewhere classified: Secondary | ICD-10-CM | POA: Diagnosis not present

## 2019-11-20 DIAGNOSIS — R0602 Shortness of breath: Secondary | ICD-10-CM | POA: Insufficient documentation

## 2019-11-20 DIAGNOSIS — R059 Cough, unspecified: Secondary | ICD-10-CM

## 2019-11-20 DIAGNOSIS — Z8249 Family history of ischemic heart disease and other diseases of the circulatory system: Secondary | ICD-10-CM | POA: Insufficient documentation

## 2019-11-20 DIAGNOSIS — J449 Chronic obstructive pulmonary disease, unspecified: Secondary | ICD-10-CM

## 2019-11-20 DIAGNOSIS — R05 Cough: Secondary | ICD-10-CM | POA: Diagnosis not present

## 2019-11-20 DIAGNOSIS — R945 Abnormal results of liver function studies: Secondary | ICD-10-CM | POA: Diagnosis not present

## 2019-11-20 DIAGNOSIS — C7801 Secondary malignant neoplasm of right lung: Secondary | ICD-10-CM | POA: Insufficient documentation

## 2019-11-20 NOTE — Progress Notes (Signed)
HEMATOLOGY/ONCOLOGY CLINIC NOTE  Date of Service: 11/20/2019  Patient Care Team: [facility] as PCP - General Everardo Beals, NP as Nurse Practitioner [facility]  CHIEF COMPLAINTS/PURPOSE OF CONSULTATION:  -f/u for metastatic breast cancer   HISTORY OF PRESENTING ILLNESS:  Brandy [name] is a wonderful 54 y.o. female who has been referred to [place] by [facility], [facility] for evaluation and management of her suspected breast cancer.   The pt reports that she was having increasing low back pain on her right side that would not resolve, so she went to the ED. This pain has not resolved. She notes pain in her hips and knees.   Of note prior to the patient's visit today, pt has had an abdominal limited right upper ultrasound completed on 02/18/2019 with results revealing "Probable hepatic steatosis. No other abnormality seen in the right upper quadrant of the abdomen."  Pt had abdomen and pelvis CT completed on 02/18/2019 with results revealing "5cm spiculated soft tissue mass in inferior right breast, highly suspicious for primary breast carcinoma. No acute findings or metastatic disease within the abdomen or pelvis. Multiple small pulmonary nodules in both lung bases, consistent with pulmonary metastases. 4.5 cm uterine fibroid."  Most recent lab results (02/18/2019) of CBC is as follows: all values are WNL except for glucose at 121, calcium at 8.7, AST at 49, and ALT at 53.  On review of systems, pt reports pedal edema and denies chest pain, shortness of breath, weight loss, and any other symptoms.  Her last known mammography was on 06/18/2012.   On Social Hx the pt reports that she is a smoker and she smokes about half a pack per day. She does not drink alcohol. She has three children: age 4, 12, and 32.  On Family Hx the pt reports lung cancer. Maternal Cousin passed from stomach cancer at the age of 34.    INTERVAL HISTORY:  I connected with   Brandy [name] on 11/20/19 by telephone and verified that I am speaking with the correct person using two identifiers.   I discussed the limitations of evaluation and management by telemedicine. The patient expressed understanding and agreed to proceed.  Other persons participating in the visit and their role in the encounter:      -Yevette Edwards, Medical Scribe  Patient's location: Home Provider's location: [place] [facility] [place] is a 54 y.o. female here for evaluation and management of likely metastatic breast cancer. The patient's last visit with [place] was on 11/01/2019. The pt reports that she is doing well overall.  The pt reports that she is still having soft stools twice per day. When she left the hospital she was discharged with antibiotics. Pt has continued taking Verzenio. Her productive cough is also returning despite continuing to use an Albuterol nebulizer, Tessalon Perles, and Mucinex twice per day. The coughing is worse at night and is causing SOB. Pt has never been formally diagnosed with COPD and does not have any medical oversight for the condition. Pt has not been able to walk much as she is still having hip pain. She has an upcoming appointment with an Orthopedist and pain clinic.   Of note since the patient's last visit, pt has had CT Angio Chest PE ([id number]) completed on 11/01/2019 with results revealing "1. No CT evidence of pulmonary embolism. 2. No significant interval change in the bilateral pulmonary nodules as above. A 4 mm right lower lobe  pulmonary nodule may be new or more conspicuous compared to the prior CT. Attention on follow-up imaging recommended. 3. Diffuse patchy hazy density throughout the lungs with diffuse mosaic appearance may represent air trapping or related to underlying small airways versus small vessel disease."  On review of systems, pt reports soft stools, productive cough, SOB, hip pain and denies abdominal pain,  blood/mucous in stools, abdominal cramping and any other symptoms.    MEDICAL HISTORY:  Past Medical History:  Diagnosis Date  . Arthritis   . ASD (atrial septal defect)    s/p closure with Amplatzer device 10/05/04 (Dr. Myriam Jacobson, [facility]) 10/05/04  . Cataract   . GERD (gastroesophageal reflux disease)   . Headache   . Heart murmur    no longer heard  . Legally blind in right eye, as defined in [place]   . Lumbar herniated disc   . Sciatica   Uterine Fibroids  SURGICAL HISTORY: Past Surgical History:  Procedure Laterality Date  . ABLATION    . BREAST SURGERY Bilateral 2011   Breast Reduction Surgery  . BUNIONECTOMY    . CARDIAC CATHETERIZATION     10/05/04 [facility]): LM < 25%, otherwise normal coronaries. No pulmonary HTN, Mildly enlarged RV. Secundum ASD s/p closure.  Marland Kitchen CARDIAC SURGERY    . CATARACT EXTRACTION    . CLEFT PALATE REPAIR     s/p cleft lip and palate repair  . LUMBAR LAMINECTOMY/DECOMPRESSION MICRODISCECTOMY Left 05/23/2016   Procedure: LEFT L4-L5 LATERAL RECESS DECOMPRESSION WITH CENTRAL AND RIGHT DECOMPRESSION VIA LEFT SIDE;  Surgeon: [name], MD;  Location: [place];  Service: Orthopedics;  Laterality: Left;  . LUMBAR LAMINECTOMY/DECOMPRESSION MICRODISCECTOMY Left 05/23/2016   Procedure: LUMBAR LAMINECTOMY/DECOMPRESSION MICRODISCECTOMY Lumbar five - Sacral One 1 LEVEL;  Surgeon: [name], MD;  Location: [place];  Service: Orthopedics;  Laterality: Left;  . SHOULDER INJECTION Left 05/23/2016   Procedure: SHOULDER INJECTION;  Surgeon: [name], MD;  Location: [place];  Service: Orthopedics;  Laterality: Left;  band-aid per pa-c  . TRANSTHORACIC ECHOCARDIOGRAM     12/15/05 [facility]): Mild LVH, EF > 62%, grade 1 diastolic dysfunction, Trivial MR/PR/TR.  Endometrial ablation 2003 Breast Reduction, bilateral 2011  SOCIAL HISTORY: Social History   Socioeconomic History  . Marital status: Married    Spouse name: Not on file  . Number of children: 3  . Years  of education: Not on file  . Highest education level: Not on file  Occupational History  . Not on file  Tobacco Use  . Smoking status: Current Every Day Smoker    Packs/day: 0.50    Types: Cigarettes  . Smokeless tobacco: Never Used  Vaping Use  . Vaping Use: Never used  Substance and Sexual Activity  . Alcohol use: No  . Drug use: No  . Sexual activity: Not on file  Other Topics Concern  . Not on file  Social History Narrative  . Not on file   Social Determinants of Health   Financial Resource Strain:   . Difficulty of Paying Living Expenses:   Food Insecurity:   . Worried About Charity fundraiser in the Last Year:   . Arboriculturist in the Last Year:   Transportation Needs:   . Film/video editor (Medical):   Marland Kitchen Lack of Transportation (Non-Medical):   Physical Activity:   . Days of Exercise per Week:   . Minutes of Exercise per Session:   Stress:   . Feeling of  Stress :   Social Connections:   . Frequency of Communication with Friends and Family:   . Frequency of Social Gatherings with Friends and Family:   . Attends Religious Services:   . Active Member of Clubs or Organizations:   . Attends Archivist Meetings:   Marland Kitchen Marital Status:   Intimate Partner Violence:   . Fear of Current or Ex-Partner:   . Emotionally Abused:   Marland Kitchen Physically Abused:   . Sexually Abused:     FAMILY HISTORY: Family History  Problem Relation Age of Onset  . Diabetes Mother   . High blood pressure Mother   . Cancer Father        Lung  . Colon cancer Neg Hx   . Colon polyps Neg Hx   . Esophageal cancer Neg Hx   . Rectal cancer Neg Hx   . Stomach cancer Neg Hx     ALLERGIES:  is allergic to zithromax [azithromycin] and psyllium.  MEDICATIONS:  Current Outpatient Medications  Medication Sig Dispense Refill  . albuterol (PROVENTIL) (2.5 MG/3ML) 0.083% nebulizer solution Take 3 mLs (2.5 mg total) by nebulization every 6 (six) hours as needed for wheezing or  shortness of breath. 75 mL 2  . albuterol (VENTOLIN HFA) 108 (90 Base) MCG/ACT inhaler Inhale 2 puffs into the lungs every 6 (six) hours as needed for wheezing or shortness of breath. 18 g 5  . ALPRAZolam (XANAX) 1 MG tablet Take 1 mg by mouth daily as needed for anxiety.     Marland Kitchen amitriptyline (ELAVIL) 25 MG tablet Take 3 tablets (75 mg total) by mouth at bedtime. 30 tablet 0  . atorvastatin (LIPITOR) 10 MG tablet Take 10 mg by mouth daily.    . benzonatate (TESSALON) 100 MG capsule Take 1 capsule (100 mg total) by mouth 3 (three) times daily as needed for cough. (Patient not taking: Reported on 10/01/2019) 60 capsule 0  . Cyanocobalamin (VITAMIN B 12 PO) Take 1 tablet by mouth daily.    . ergocalciferol (VITAMIN D2) 1.25 MG (50000 UT) capsule Take 1 capsule (50,000 Units total) by mouth once a week. 12 capsule 3  . Ferrous Sulfate (IRON PO) Take 1 tablet by mouth daily.    . fluticasone (FLOVENT HFA) 110 MCG/ACT inhaler Inhale 2 puffs into the lungs 2 (two) times daily. 1 Inhaler 5  . guaiFENesin (MUCINEX) 600 MG 12 hr tablet Take 1 tablet (600 mg total) by mouth 2 (two) times daily. 60 tablet 2  . guaiFENesin-dextromethorphan (ROBITUSSIN DM) 100-10 MG/5ML syrup Take 10 mLs by mouth every 4 (four) hours as needed for cough. 118 mL 0  . HYDROcodone-acetaminophen (NORCO/VICODIN) 5-325 MG tablet Take 1-2 tablets by mouth every 6 (six) hours as needed for moderate pain.    Marland Kitchen HYDROcodone-homatropine (HYCODAN) 5-1.5 MG/5ML syrup Take 5 mLs by mouth every 6 (six) hours as needed for cough. (Patient not taking: Reported on 10/01/2019) 120 mL 0  . letrozole (FEMARA) 2.5 MG tablet TAKE 1 TABLET BY MOUTH ONCE DAILY (Patient taking differently: Take 2.5 mg by mouth daily. ) 30 tablet 5  . Magnesium 500 MG TABS Take 1 tablet (500 mg total) by mouth in the morning and at bedtime. 60 tablet 1  . omeprazole (PRILOSEC) 10 MG capsule Take 20 mg by mouth daily.     . ondansetron (ZOFRAN) 8 MG tablet Take 1 tablet (8 mg  total) by mouth every 8 (eight) hours as needed for nausea or vomiting. 30 tablet 3  .  PARoxetine (PAXIL) 10 MG tablet Take 10 mg by mouth daily.    . prednisoLONE acetate (PRED FORTE) 1 % ophthalmic suspension Place 1 drop into the left eye 4 (four) times daily.    Marland Kitchen VERZENIO 100 MG tablet TAKE 1 TABLET (100 MG TOTAL) BY MOUTH 2 (TWO) TIMES DAILY. (Patient taking differently: Take 100 mg by mouth 2 (two) times daily. ) 56 tablet 0   Current Facility-Administered Medications  Medication Dose Route Frequency Provider Last Rate Last Admin  . 0.9 %  sodium chloride infusion  500 mL Intravenous Continuous Nandigam, Venia Minks, MD        REVIEW OF SYSTEMS:   A 10+ POINT REVIEW OF SYSTEMS WAS OBTAINED including neurology, dermatology, psychiatry, cardiac, respiratory, lymph, extremities, GI, GU, Musculoskeletal, constitutional, breasts, reproductive, HEENT.  All pertinent positives are noted in the HPI.  All others are negative.    PHYSICAL EXAMINATION: ECOG FS:1 - Symptomatic but completely ambulatory  There were no vitals filed for this visit. Wt Readings from Last 3 Encounters:  11/01/19 231 lb (104.8 kg)  11/01/19 231 lb (104.8 kg)  10/01/19 231 lb 9.6 oz (105.1 kg)   There is no height or weight on file to calculate BMI.    Telehealth visit   LABORATORY DATA:  I have reviewed the data as listed  . CBC Latest Ref Rng & Units 11/01/2019 10/01/2019 09/08/2019  WBC 4.0 - 10.5 K/uL 4.0 6.0 5.1  Hemoglobin 12.0 - 15.0 g/dL 11.1(L) 11.6(L) 11.7(L)  Hematocrit 36 - 46 % 33.9(L) 35.4(L) 36.2  Platelets 150 - 400 K/uL 242 228 261    . CMP Latest Ref Rng & Units 11/01/2019 10/01/2019 09/08/2019  Glucose 70 - 99 mg/dL 127(H) 96 109(H)  BUN 6 - 20 mg/dL 10 15 12   Creatinine 0.44 - 1.00 mg/dL 1.35(H) 1.13(H) 1.19(H)  Sodium 135 - 145 mmol/L 140 144 139  Potassium 3.5 - 5.1 mmol/L 3.7 3.6 2.9(L)  Chloride 98 - 111 mmol/L 103 105 100  CO2 22 - 32 mmol/L 24 29 28   Calcium 8.9 - 10.3 mg/dL 9.2  8.5(L) 8.5(L)  Total Protein 6.5 - 8.1 g/dL 7.4 7.7 6.9  Total Bilirubin 0.3 - 1.2 mg/dL 0.5 0.4 0.7  Alkaline Phos 38 - 126 U/L 168(H) 180(H) 115  AST 15 - 41 U/L 85(H) 150(H) 34  ALT 0 - 44 U/L 100(H) 188(H) 45(H)   Component     Latest Ref Rng & Units 03/08/2019 03/22/2019  FSH     mIU/mL 65.5   LH     mIU/mL 46.6   Estrogen     pg/mL 82   Progesterone     ng/mL <0.1   Estradiol     pg/mL  <5.0      RADIOGRAPHIC STUDIES: I have personally reviewed the radiological images as listed and agreed with the findings in the report. DG Chest 2 View  Result Date: 11/01/2019 CLINICAL DATA:  Cough and short of breath.  History of cancer. EXAM: CHEST - 2 VIEW COMPARISON:  09/07/2019 FINDINGS: Heart size normal. Atrial septal defect closure device unchanged. Pulmonary vascularity normal. Lungs are clear without infiltrate effusion or mass. IMPRESSION: No active cardiopulmonary disease. Electronically Signed   By: Franchot Gallo M.D.   On: 11/01/2019 16:41   CT Angio Chest PE W and/or Wo Contrast  Result Date: 11/01/2019 CLINICAL DATA:  54 year old female with shortness of breath. History of breast and lung cancer. EXAM: CT ANGIOGRAPHY CHEST WITH CONTRAST TECHNIQUE: Multidetector CT imaging of  the chest was performed using the standard protocol during bolus administration of intravenous contrast. Multiplanar CT image reconstructions and MIPs were obtained to evaluate the vascular anatomy. CONTRAST:  146m OMNIPAQUE IOHEXOL 350 MG/ML SOLN COMPARISON:  Chest radiograph dated 08/04/2019. FINDINGS: Cardiovascular: There is no cardiomegaly or pericardial effusion. And atrial septal defect occlusive device noted. The thoracic aorta is unremarkable. The origins of the great vessels of the aortic arch appear patent as visualized. Mild dilatation of the main pulmonary trunk suggestive of a degree of pulmonary hypertension. Evaluation of the pulmonary arteries is somewhat limited due to respiratory motion  artifact. No pulmonary artery embolus identified. Mediastinum/Nodes: There is no hilar or mediastinal adenopathy. The esophagus and thyroid gland are grossly unremarkable. No mediastinal fluid collection. Lungs/Pleura: No significant interval change in the right upper lobe nodule measuring approximately 10 x 12 mm. Additional 5 mm nodule in the right lung (49/6) is also similar to prior CT. A 4 mm left apical nodule is also stable. There is a 4 mm nodule in the right lower lobe (58/6) which may be new or more conspicuous compared to the prior CT. Diffuse patchy hazy density throughout the lungs with diffuse mosaic appearance may represent air trapping or related to underlying small airways versus small vessel disease. No focal consolidation, pleural effusion, or pneumothorax. Upper Abdomen: Probable mild fatty liver. The visualized upper abdomen is otherwise unremarkable. Musculoskeletal: Degenerative changes of the spine. No acute osseous pathology. No significant interval change in the appearance of the soft tissue mass in the right breast. Review of the MIP images confirms the above findings. IMPRESSION: 1. No CT evidence of pulmonary embolism. 2. No significant interval change in the bilateral pulmonary nodules as above. A 4 mm right lower lobe pulmonary nodule may be new or more conspicuous compared to the prior CT. Attention on follow-up imaging recommended. 3. Diffuse patchy hazy density throughout the lungs with diffuse mosaic appearance may represent air trapping or related to underlying small airways versus small vessel disease. Electronically Signed   By: AAnner CreteM.D.   On: 11/01/2019 16:49    ASSESSMENT & PLAN:   ESHABRIA EGLEYis a 54y.o. female with:  1. Metastatic breast cancer ER+/PRneg/Her2 neg  02/28/2019 neck CT with results revealing "negative for mass or adenopathy in the neck."  03/04/2019 head MRI with results revealing "Negative for metastatic disease.  No acute  abnormality in the brain."  2. Likely Pulmonary metastases  02/18/2019 chest and abdomen with results revealing "5cm spiculated soft tissue mass in inferior right breast, highly suspicious for primary breast carcinoma. No acute findings or metastatic disease within the abdomen or pelvis. Multiple small pulmonary nodules in both lung bases, consistent with pulmonary metastases. 4.5 cm uterine fibroid."  02/28/2019 C/A/P CT with results revealing "Irregular solid 5.0 cm right breast mass, suspicious for primary right breast malignancy. Innumerable solid pulmonary nodules scattered throughout both lungs, compatible with pulmonary metastases. No evidence of metastatic disease in the abdomen, pelvis or skeleton. Mildly enlarged and probably myomatous uterus. Simple 1.4 cm left adnexal cyst requires no follow-up. This recommendation follows ACR consensus guidelines: White Paper of the ACR Incidental Findings Committee II on Adnexal Findings. J Am Coll Radiol [id number] Aortic Atherosclerosis (ICD10-I70.0)."  NUCLEAR MEDICINE WHOLE BODY BONE SCAN completed on 03/19/2019 with results revealing "1. No scintigraphic evidence skeletal metastasis. 2. Degenerative bone disease in the posterior elements of the upper and mid lumbar spine."   04/09/2019 Bone Density ([id number] which revealed "The BMD measured  at Femur Neck Right is 1.153 g/cm2 with a T-score of 0.8. This patient is considered normal according to [facility]) criteria. Lumbar spine was not utilized due to advanced degenerative changes. The scan quality is good. Femur Neck Right 04/09/2019 54.7 Normal 0.8 1.153 g/cm2. Left Forearm Radius 33% 04/09/2019 54.7 Normal 0.8 0.949 g/cm2."  08/04/2019 CT Angio Chest ([id number]) revealed "1. No lobar or central pulmonary embolus detected. Exam is limited secondary to respiratory motion. 2. Mild ground-glass attenuation may represent mild pneumonitis or areas of air trapping. 3. Signs of  atrial septal closure. 4. Decrease in size and number of bilateral pulmonary nodules, marked response noted on today's exam with the only nodule remaining near a cm in the right upper lobe and the smaller nodules that were present on the previous examination throughout the chest no longer measurable though the lower lobes are limited by respiratory motion. 5. Decreased size of right breast mass. 6. Probable hepatic steatosis."  3. Abnormal Liver function test -- previous? Fatty liver 07/03/2019 [place] Abd ([id number]) revealed "1. No acute findings. Normal gallbladder. No bile duct dilation. 2. Significant increased liver parenchymal echogenicity consistent with extensive hepatic steatosis."  PLAN: -Discussed 11/01/2019 CT Angio Chest PE ([id number]) which revealed "1. No CT evidence of pulmonary embolism. 2. No significant interval change in the bilateral pulmonary nodules as above. A 4 mm right lower lobe pulmonary nodule may be new or more conspicuous compared to the prior CT. Attention on follow-up imaging recommended. 3. Diffuse patchy hazy density throughout the lungs with diffuse mosaic appearance may represent air trapping or related to underlying small airways versus small vessel disease." -Advised pt that Verzenio can cause dry cough and lung inflammation in a small population. Would like to determine if cough is medication related or due to lung disease.  -Recommend pt take OTC probiotics to improve loose stools  -Will get PFTs in 1 week -Advised pt to hold Verzenio at this time -Will refer pt to Pulmonology for COPD evaluation -Recommend pt f/u as scheduled with Orthopedist and Pain Management Clinic -Will see back in 4 weeks with labs   FOLLOW UP: PFTs in 1 week Referral to pulmonary for ?COPD for evaluation and optimization of treatment RTC with Dr Irene Limbo with labs in 4 weeks    The total time spent in the appt was 20 minutes and more than 50% was on counseling and direct patient  cares.  All of the patient's questions were answered with apparent satisfaction. The patient knows to call the clinic with any problems, questions or concerns.    Sullivan Lone MD [place] [facility] Hematology/Oncology Physician [facility]  (Office):       [phone] (Work cell):  [phone] (Fax):           [phone]  11/20/2019 4:52 PM  I, Yevette Edwards, am acting as a scribe for Dr. Sullivan Lone.   .I have reviewed the above documentation for accuracy and completeness, and I agree with the above. Brunetta Genera MD

## 2019-11-25 ENCOUNTER — Telehealth: Payer: Self-pay | Admitting: Hematology

## 2019-11-25 NOTE — Telephone Encounter (Signed)
Scheduled per los, patient has been called and notified. 

## 2019-11-25 NOTE — Progress Notes (Signed)
Patient: Brandy [name] [facility]  Service Category: E/M  Provider: Gaspar Cola, MD  DOB: 1965/07/02  DOS: 11/27/2019  Referring Provider: [facility]  MRN: [id number]  Setting: Ambulatory outpatient  PCP: [facility]  Type: New Patient  Specialty: Interventional Pain Management    Location: Office  Delivery: Face-to-face     Primary Reason(s) for Visit: Encounter for initial evaluation of one or more chronic problems (new to examiner) potentially causing chronic pain, and posing a threat to normal musculoskeletal function. (Level of risk: High) CC: Back Pain  HPI  Brandy [name] is a 54 y.o. year old, female patient, who comes today to see [place] for the first time for an initial evaluation of her chronic pain. 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, L5-S1); Acute pansinusitis; Anxiety disorder; Insomnia; Low back pain radiating to both legs; Migraine; Neuropathy; Pneumonitis; Breast cancer ([place]); Tachycardia; Diarrhea; GERD (gastroesophageal reflux disease); Hyperlipidemia; CKD (chronic kidney disease), stage III; Depression; Malignant hypertension; Transaminitis; Lactic acidosis; Hepatic steatosis; Sepsis ([place]); Nausea & vomiting; Obesity (BMI 30-39.9); Infiltrating ductal carcinoma of breast ([place]); Asthma; Bilateral leg weakness; Ocular proptosis; PVD (posterior vitreous detachment), left eye; Chorioretinal scar of left eye; Vitreous floaters of left eye; Chronic pain syndrome; Pharmacologic therapy; Disorder of skeletal system; Problems influencing health status; Abnormal MRI, lumbar spine (08/22/2018); Chronic low back pain (1ry area of Pain) (Bilateral) w/ sciatica (Bilateral); DDD (degenerative disc disease), lumbosacral; Grade 1 Anterolisthesis (L2-3, L3-4); Grade 1 Retrolisthesis (L4-5, L5-S1); Lumbar facet hypertrophy (Multilevel) (Bilateral); Lumbar facet syndrome (Multilevel)  (Bilateral) (R>L); Chronic lower extremity pain (2ry area of Pain) (Bilateral) (R>L); Neurogenic pain; Chronic musculoskeletal pain; Chronic hip pain (3ry area of Pain) (Bilateral) (R>L); and Chronic sacroiliac joint pain (Left) on their problem list. Today she comes in for evaluation of her Back Pain  Pain Assessment: Location: Right, Lower Back Radiating: Pain radiaties everywhere Onset: More than a month ago Duration: Chronic pain Quality: Aching, Burning, Tingling, Numbness, Dull, Constant, Throbbing, Pressure, Tightness Severity: 7 /10 (subjective, self-reported pain score)  Note: Reported level is inconsistent with clinical observations. Clinically the patient looks like a 5/10 A 5/10 is viewed as "Severe" and described as intense and extremely unpleasant. Associated with frowning face and frequent crying. Pain overwhelms the senses.  Ability to do any activity or maintain social relationships becomes significantly limited. Conversation becomes difficult. Pacing back and forth is common, as getting into a comfortable position is nearly impossible. Pain wakes you up from deep sleep. Physical signs will be obvious: pupillary dilation; increased sweating; goosebumps; brisk reflexes; cold, clammy hands and feet; nausea, vomiting or dry heaves; loss of appetite; significant sleep disturbance with inability to fall asleep or to remain asleep. When persistent, significant weight loss is observed due to the complete loss of appetite and sleep deprivation.  Blood pressure and heart rate becomes significantly elevated. Information on the proper use of the pain scale provided to the patient today. When using our objective Pain Scale, levels between 6 and 10/10 are said to belong in an emergency room, as it progressively worsens from a 6/10, described as severely limiting, requiring emergency care not usually available at an outpatient pain management facility. At a 6/10 level, communication becomes difficult and  requires great effort. Assistance to reach the emergency department may be required. Facial flushing and profuse sweating along with potentially dangerous increases in heart rate and blood pressure will be evident. Effect on ADL:  limits my daily actvities Timing: Constant Modifying factors: nothing BP: 123/70  HR: 92  Onset and Duration: Sudden and Date of onset: 20 years ago Cause of pain: Work related accident or event Severity: No change since onset, NAS-11 at its worse: 10/10, NAS-11 at its best: 5/10, NAS-11 now: 7/10 and NAS-11 on the average: 7/10 Timing: Not influenced by the time of the day, During activity or exercise, After activity or exercise and After a period of immobility Aggravating Factors: Bending, Climbing, Kneeling, Lifiting, Motion, Nerve blocks, Prolonged sitting, Prolonged standing, Squatting, Stooping , Twisting, Walking and Walking uphill Alleviating Factors: Hot packs, Lying down, Medications and Resting Associated Problems: Depression, Dizziness, Fatigue, Numbness, Spasms, Sweating, Tingling, Pain that wakes patient up and Pain that does not allow patient to sleep Quality of Pain: Aching, Burning, Constant, Disabling, Dreadful, Exhausting, Feeling of constriction, Getting longer, Nagging, Pressure-like, Sharp, Shooting, Stabbing, Tender, Throbbing, Tingling and Uncomfortable Previous Examinations or Tests: Bone scan, CT scan, MRI scan, Nerve block, Neurological evaluation, Neurosurgical evaluation, Orthopedic evaluation and Chiropractic evaluation Previous Treatments: Chiropractic manipulations, Narcotic medications, Physical Therapy, Steroid treatments by mouth, Strengthening exercises and Trigger point injections  The patient comes into the clinics today for the first time for a chronic pain management evaluation.  According to the patient the primary area of pain is that of the lower back, bilaterally. (R>L) she indicates having had 1 back surgery done approximately 4  years ago by Dr. Louanne Skye.  She also indicated having had some physical therapy for this back pain but it did not help her.  She denies any recent imaging of the back except for a possible x-ray done in 2020.  The patient indicates also having had some nerve blocks that were done at [facility] by a radiologist.  This was ordered by Estill Bamberg the [facility] PA for the orthopedic department.  The patient's secondary pain is that of the lower extremities, bilaterally, (R>L).  She denies any surgeries in the leg and does admit having had some physical therapy several years ago which did help with leg pain.  In the case of the right lower extremity the pain goes down to the area of the knee through the posterior and lateral aspect of the leg.  However she also indicates having intermittent pain below the knee that runs through the back of the leg.  In the case of the left lower extremity the pain also seems to run through the back of the leg, just below the calf area.  The patient denies any nerve conduction tests or nerve blocks in the lower extremities.  The third area pain is that of the hips, bilaterally, (R>L).  The patient denies any surgeries, nerve blocks, physical therapy, or x-rays of that area.  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  She is being treated at  [facility] oncology and she is undergoing chemotherapy for this.  Today's physical exam was positive for bilateral lumbar facet disease, bilateral hip arthralgia (R>L), left sacroiliac joint dysfunction, as well as generalized deconditioning.  Today I took the time to provide the patient with information regarding my pain practice. The patient was informed that my practice is divided into two sections: an interventional pain management section, as well as a completely separate and distinct medication management section. I explained that I  have procedure days for my interventional therapies, and evaluation days for follow-ups and medication management. Because of  the amount of documentation required during both, they are kept separated. This means that there is the possibility that she may be scheduled for a procedure on one day, and medication management the next. I have also informed her that because of staffing and facility limitations, I no longer take patients for medication management only. To illustrate the reasons for this, I gave the patient the example of surgeons, and how inappropriate it would be to refer a patient to his/her care, just to write for the post-surgical antibiotics on a surgery done by a different surgeon.   Because interventional pain management is my board-certified specialty, the patient was informed that joining my practice means that they are open to any and all interventional therapies. I made it clear that this does not mean that they will be forced to have any procedures done. What this means is that I believe interventional therapies to be essential part of the diagnosis and proper management of chronic pain conditions. Therefore, patients not interested in these interventional alternatives will be better served under the care of a different practitioner.  The patient was also made aware of my Comprehensive Pain Management Safety Guidelines where by joining my practice, they limit all of their nerve blocks and joint injections to those done by our practice, for as long as we are retained to manage their care.   Historic Controlled Substance Pharmacotherapy Review  PMP and historical list of controlled substances: Alprazolam 1 mg; hydrocodone/APAP 5/325; Hydromet 5 mg - 1.5 mg per 5 mL solution; hydrocodone/chloramphenicol ER suspension; hydrocodone/APAP 7.5/300; hydrocodone/APAP 7.5/325; oxycodone IR 10 mg Highest opioid analgesic regimen found: Oxycodone IR 10 mg 1 tablet p.o. 3 times daily (30 mg/day of  oxycodone) (45 MME)/hydrocodone/APAP 5/325 9 tablets/day (45 mg/day of hydrocodone) (45 MME/day) Most recent opioid analgesic: hydrocodone/APAP 5/325 9 tablets/day (45 mg/day of hydrocodone) (45 MME/day) (09/02/2019) Current opioid analgesics:  None Highest recorded MME/day: 45 mg/day MME/day: 0 mg/day  Medications: The patient did not bring the medication(s) to the appointment, as requested in our "New Patient Package" Pharmacodynamics: Desired effects: Analgesia: The patient reports >50% benefit. Reported improvement in function: The patient reports medication allows her to accomplish basic ADLs. Clinically meaningful improvement in function (CMIF): Sustained CMIF goals met Perceived effectiveness: Described as relatively effective, allowing for increase in activities of daily living (ADL) Undesirable effects: Side-effects or Adverse reactions: None reported Historical Monitoring: The patient  reports no history of drug use. List of all UDS Test(s): Lab Results  Component Value Date   COCAINSCRNUR NONE DETECTED 09/04/2019   THCU NONE DETECTED 09/04/2019   List of other Serum/Urine Drug Screening Test(s):  Lab Results  Component Value Date   COCAINSCRNUR NONE DETECTED 09/04/2019   THCU NONE DETECTED 09/04/2019   Historical Background Evaluation: [place] PMP: PDMP reviewed during this encounter. Six (6) year initial data search conducted.             PMP NARX Score Report:  Narcotic: 420 Sedative: 441 Stimulant: 000 [place] Department of public safety, offender search: Editor, commissioning Information) Non-contributory Risk Assessment Profile: Aberrant behavior: None observed or detected today Risk factors for fatal opioid overdose: None identified today PMP NARX Overdose Risk Score: 360 Fatal overdose hazard ratio (HR): Calculation deferred Non-fatal overdose hazard ratio (HR): Calculation deferred Risk of opioid abuse or dependence: 0.7-3.0% with doses ? 36 MME/day and 6.1-26% with doses ? 120  MME/day. Substance use disorder (SUD) risk level: See below Personal History of Substance Abuse (SUD-Substance use disorder):  Alcohol: Negative  Illegal Drugs: Negative  Rx Drugs: Negative  ORT Risk Level calculation: Low Risk  Opioid Risk Tool - 11/27/19 1010      Family History of Substance Abuse   Alcohol Negative    Illegal Drugs Negative    Rx Drugs Negative      Personal History of Substance Abuse   Alcohol Negative    Illegal Drugs Negative    Rx Drugs Negative      Age   Age between 93-45 years  No      History of Preadolescent Sexual Abuse   History of Preadolescent Sexual Abuse Negative or Female      Psychological Disease   Psychological Disease Negative    Depression Negative      Total Score   Opioid Risk Tool Scoring 0    Opioid Risk Interpretation Low Risk          ORT Scoring interpretation table:  Score <3 = Low Risk for SUD  Score between 4-7 = Moderate Risk for SUD  Score >8 = High Risk for Opioid Abuse    Pharmacologic Plan: As per protocol, I have not taken over any controlled substance management, pending the results of ordered tests and/or consults.            Initial impression: Pending review of available data and ordered tests.  Meds   Current Outpatient Medications:  .  albuterol (PROVENTIL) (2.5 MG/3ML) 0.083% nebulizer solution, Take 3 mLs (2.5 mg total) by nebulization every 6 (six) hours as needed for wheezing or shortness of breath., Disp: 75 mL, Rfl: 2 .  albuterol (VENTOLIN HFA) 108 (90 Base) MCG/ACT inhaler, Inhale 2 puffs into the lungs every 6 (six) hours as needed for wheezing or shortness of breath., Disp: 18 g, Rfl: 5 .  ALPRAZolam (XANAX) 1 MG tablet, Take 1 mg by mouth daily as needed for anxiety. , Disp: , Rfl:  .  amitriptyline (ELAVIL) 25 MG tablet, Take 3 tablets (75 mg total) by mouth at bedtime., Disp: 30 tablet, Rfl: 0 .  atorvastatin (LIPITOR) 10 MG tablet, Take 10 mg by mouth daily., Disp: , Rfl:  .  benzonatate  (TESSALON) 100 MG capsule, Take 1 capsule (100 mg total) by mouth 3 (three) times daily as needed for cough., Disp: 60 capsule, Rfl: 0 .  ergocalciferol (VITAMIN D2) 1.25 MG (50000 UT) capsule, Take 1 capsule (50,000 Units total) by mouth once a week., Disp: 12 capsule, Rfl: 3 .  fluticasone (FLOVENT HFA) 110 MCG/ACT inhaler, Inhale 2 puffs into the lungs 2 (two) times daily., Disp: 1 Inhaler, Rfl: 5 .  guaiFENesin (MUCINEX) 600 MG 12 hr tablet, Take 1 tablet (600 mg total) by mouth 2 (two) times daily., Disp: 60 tablet, Rfl: 2 .  letrozole (FEMARA) 2.5 MG tablet, TAKE 1 TABLET BY MOUTH ONCE DAILY (Patient taking differently: Take 2.5 mg by mouth daily. ), Disp: 30 tablet, Rfl: 5 .  Magnesium 500 MG TABS, Take 1 tablet (500 mg total) by mouth in the morning and at bedtime., Disp: 60 tablet, Rfl: 1 .  omeprazole (PRILOSEC) 10 MG capsule, Take 20 mg by mouth daily. , Disp: , Rfl:  .  ondansetron (ZOFRAN) 8 MG tablet, Take 1 tablet (8 mg total) by mouth every 8 (eight) hours as needed for nausea or vomiting., Disp: 30 tablet, Rfl: 3 .  prednisoLONE acetate (PRED FORTE) 1 % ophthalmic suspension, Place 1 drop into the left eye 4 (four) times daily., Disp: , Rfl:  .  VERZENIO 100 MG tablet, TAKE 1 TABLET (100 MG TOTAL) BY MOUTH 2 (TWO) TIMES DAILY. (Patient taking differently: Take 100 mg by mouth 2 (two) times daily. ), Disp: 56 tablet, Rfl: 0 .  Cyanocobalamin (VITAMIN B 12 PO), Take 1 tablet by mouth daily. (Patient not taking: Reported on 11/27/2019), Disp: , Rfl:  .  Ferrous Sulfate (IRON PO), Take 1 tablet by mouth daily. (Patient not taking: Reported on 11/27/2019), Disp: , Rfl:  .  guaiFENesin-dextromethorphan (ROBITUSSIN DM) 100-10 MG/5ML syrup, Take 10 mLs by mouth every 4 (four) hours as needed for cough. (Patient not taking: Reported on 11/27/2019), Disp: 118 mL, Rfl: 0 .  HYDROcodone-acetaminophen (NORCO/VICODIN) 5-325 MG tablet, Take 1-2 tablets by mouth every 6 (six) hours as needed for moderate  pain. (Patient not taking: Reported on 11/27/2019), Disp: , Rfl:  .  HYDROcodone-homatropine (HYCODAN) 5-1.5 MG/5ML syrup, Take 5 mLs by mouth every 6 (six) hours as needed for cough. (Patient not taking: Reported on 10/01/2019), Disp: 120 mL, Rfl: 0 .  PARoxetine (PAXIL) 10 MG tablet, Take 10 mg by mouth daily., Disp: , Rfl:   Current Facility-Administered Medications:  .  0.9 %  sodium chloride infusion, 500 mL, Intravenous, Continuous, Nandigam, Kavitha V, MD  Imaging Review  Shoulder Imaging: MR Shoulder Left w/o contrast  Narrative CLINICAL DATA:  Shoulder pain for 6 months. No known injury or prior relevant surgery.  EXAM: MRI OF THE LEFT SHOULDER WITHOUT CONTRAST  TECHNIQUE: Multiplanar, multisequence MR imaging of the shoulder was performed. No intravenous contrast was administered.  COMPARISON:  Radiographs 07/18/2016.  FINDINGS: Rotator cuff:  Intact without significant tendinosis.  Muscles: There is some T2 hyperintensity within the deltoid muscle posterolateral to the acromion. The patient denies shoulder injection, and this may reflect a muscular strain. There is no abnormal signal within the rotator cuff musculature.  Biceps long head:  Intact and normally positioned.  Acromioclavicular Joint: The acromion is type 1. There are mild acromioclavicular degenerative changes. There is a small amount of fluid in the subacromial -subdeltoid bursa.  Glenohumeral Joint: No significant shoulder joint effusion or glenohumeral arthropathy.  Labrum:  No evidence of labral tear.  Bones: No acute or significant extra-articular osseous findings.  Other: No significant soft tissue findings.  IMPRESSION: 1. Probable posterior deltoid muscle strain with adjacent subacromial -subdeltoid bursitis. 2. The rotator cuff, labrum and biceps tendon appear normal.   Electronically Signed By: Richardean Sale M.D. On: 07/26/2016 16:23  Thoracic Imaging: DG Thoracolumabar  Spine  Narrative Clinical Data: Increased activity on bone scan to the left in the region of T9-10, back pain  THORACOLUMBAR SPINE - 2 VIEW  Comparison: Nuclear medicine bone scan of today  Findings: Only mild degenerative changes noted which may account for the faint increased activity in the lower thoracic spine in the region of T9-10 on the left.  The no lytic or blastic lesion is seen and the pedicles appear normal.  IMPRESSION: The small focus of activity to the left at T9-10 on today's bone scan is most likely due to mild degenerative change.  No other bony abnormality is seen.  Provider: Pearla Dubonnet  Lumbosacral Imaging: MR LUMBAR SPINE WO CONTRAST  Narrative CLINICAL DATA:  54 year old female with bilateral low back pain, right greater than left. Prior surgery.  EXAM: MRI LUMBAR SPINE WITHOUT CONTRAST  TECHNIQUE: Multiplanar, multisequence MR imaging of the lumbar spine was performed. No intravenous contrast was administered.  COMPARISON:  Lumbar MRI 01/13/2017.  Lumbar radiographs 08/25/2016.  FINDINGS:  Segmentation: Normal on the comparison radiographs which is the same numbering system used on the 2018 MRI.  Alignment: Stable since 2018. Mild grade 1 anterolisthesis at L2-L3, and to a lesser extent L3-L4. Mild retrolisthesis at L4-L5 and L5-S1. Overall straightening of lumbar lordosis.  Vertebrae: Degenerative posterior element marrow signal changes throughout the lumbar spine with no definite marrow edema or evidence of acute osseous abnormality. Normal background bone marrow signal. Intact visible sacrum and SI joints.  Conus medullaris and cauda equina: Conus extends to the L1-L2 level. No lower spinal cord or conus signal abnormality.  Paraspinal and other soft tissues: Negative visible abdominal viscera. Mild postoperative soft tissue changes to the left of midline posteriorly at L4-L5. Stable visible uterine fundus.  Disc levels:  T11-T12:  Chronic mild disc desiccation and disc bulge. Moderate facet hypertrophy. Mild left greater than right T11 foraminal stenosis.  T12-L1: Negative disc. Moderate facet hypertrophy is chronic. No stenosis.  L1-L2: Negative disc. Moderate facet and ligament flavum hypertrophy is chronic. No stenosis.  L2-L3: Mild anterolisthesis. Negative disc. Moderate to severe facet and ligament flavum hypertrophy. No associated stenosis.  L3-L4: Subtle anterolisthesis. A broad-based right foraminal and far lateral disc protrusion on series 8, image 17 appears increased since 2018. Chronic moderate to severe facet and ligament flavum hypertrophy with mildly progressed degenerative facet joint fluid. No spinal or lateral recess stenosis. Mild to moderate right L3 foraminal/extraforaminal stenosis appears increased.  L4-L5: Chronic postoperative changes to the lamina with moderate residual facet hypertrophy. Mild retrolisthesis and disc bulge with small chronic left subarticular component with annular fissure (series 5, image 10. Left lateral recess is stable without convincing stenosis. No spinal stenosis. Mild left L4 foraminal stenosis is stable.  L5-S1: Chronic postoperative changes to the posterior elements with moderate residual facet hypertrophy greater on the right. Chronic retrolisthesis and disc space loss with circumferential disc bulge and endplate spurring eccentric to the right. Broad-based posterior component of disc. No spinal stenosis. Mild to moderate right lateral recess stenosis is chronic but appears mildly progressed since 2018 (right S1 nerve level). No foraminal stenosis.  IMPRESSION: 1. Chronic multilevel mild lumbar spondylolisthesis with chronically advanced facet arthropathy throughout the lumbar spine. Chronic postoperative changes to the lamina at L4-L5 and L5-S1. 2. Since 2018: - a chronic right foraminal disc protrusion at L3-L4 appears mildly increased along with  mild to moderate right L3 nerve level stenosis. - chronic multifactorial right lateral recess stenosis at L5-S1 appears mildly increased at the right S1 nerve level. 3. No lumbar spinal stenosis. Stable bilateral T11 and left L4 neural foraminal stenosis.   Electronically Signed By: Genevie Ann M.D. On: 08/22/2018 11:20  MR Lumbar Spine W Wo Contrast  Narrative CLINICAL DATA:  Intermittent back pain for 5 years, bilateral lower extremity burning and numbness. Follow-up radiculopathy.  EXAM: MRI LUMBAR SPINE WITHOUT AND WITH CONTRAST  TECHNIQUE: Multiplanar and multiecho pulse sequences of the lumbar spine were obtained without and with intravenous contrast.  CONTRAST:  19m MULTIHANCE GADOBENATE DIMEGLUMINE 529 MG/ML IV SOLN  COMPARISON:  MRI of the lumbar spine March 06, 2016 and lumbar spine radiographs August 25, 2016  FINDINGS: SEGMENTATION: For the purposes of this report, the last well-formed intervertebral disc will be reported as L5-S1.  ALIGNMENT: Straightened lumbar lordosis. Minimal grade 1 L2-3 anterolisthesis. Minimal grade 1 L4-5 retrolisthesis. Grade 1 L5-S1 retrolisthesis.  VERTEBRAE:Vertebral bodies are intact. Moderate to severe L5-S1 disc height loss, progressed from prior MRI. Mild desiccation lower lumbar discs. Mild chronic discogenic  endplate changes M3-N3. Similar low T1, bright STIR signal single segment upper coccyx, without definite enhancement though, not tailored for evaluation. No abnormal or acute lumbar spine bone marrow signal or enhancement. No abnormal disc enhancement.  CONUS MEDULLARIS: Conus medullaris terminates at L1-2 and demonstrates normal morphology and signal characteristics. Cauda equina is normal. No abnormal cord, leptomeningeal or epidural enhancement.  PARASPINAL AND SOFT TISSUES: Included prevertebral and paraspinal soft tissues are nonacute. Moderate LEFT sacral paraspinal denervation/atrophy.  DISC  LEVELS:  T12-L1, L1-2: No disc bulge, canal stenosis nor neural foraminal narrowing. Mild to moderate facet arthropathy.  L2-3: Anterolisthesis. Unroofing of the disc without disc bulge. 4 mm RIGHT facet synovial cyst decreased from 9 mm, decreased fluid component. Moderate to severe RIGHT, moderate LEFT facet arthropathy and ligamentum flavum redundancy without canal stenosis or neural foraminal narrowing.  L3-4: Small broad-based RIGHT extraforaminal disc protrusion unchanged. Moderate to severe facet arthropathy without canal stenosis or neural foraminal narrowing.  L4-5: Retrolisthesis. LEFT hemilaminectomy. Homogeneous enhancing granulation tissue within the surgical bed, no fluid collection. Small LEFT subarticular disc protrusion and annular fissure persists. No canal stenosis. Mild LEFT neural foraminal narrowing.  L5-S1: Bilateral L5-S1 laminectomies. Homogeneous enhancing granulation tissue within the surgical bed, no focal fluid collection. Mild facet arthropathy. No canal stenosis. Mild LEFT neural foraminal narrowing.  IMPRESSION: 1. Status post LEFT L4-5 and bilateral L5-S1 laminectomies. 2. Grade 1 L2-3 anterolisthesis, grade 1 L4-5 retrolisthesis, grade 1 L5-S1 retrolisthesis. 3. No canal stenosis. Mild LEFT L4-5 and L5-S1 neural foraminal narrowing. 4. Similar abnormal upper coccyx bone marrow signal, nonspecific and incompletely characterized.   Electronically Signed By: Elon Alas M.D. On: 01/14/2017 00:23  DG Lumbar Spine 1 View  Narrative CLINICAL DATA:  Left L4-5 decompression.  EXAM: LUMBAR SPINE - 1 VIEW  COMPARISON:  05/23/2016 intraoperative lumbar spine radiographs .  FINDINGS: There is a posterior approach surgical marking device with the tip overlying the spinal canal at the level of the L5-S1 disc. Additional surgical instruments overlie the posterior lower back soft tissues at the L4-S1 levels.  IMPRESSION: Posterior  approach surgical marking device position as described.   Electronically Signed By: Ilona Sorrel M.D. On: 05/23/2016 10:57  DG Lumbar Spine 2-3 Views  Narrative CLINICAL DATA:  Intraoperative localization for L5-S1 decompression.  EXAM: LUMBAR SPINE - 2-3 VIEW  COMPARISON:  Lumbar spine MRI on 03/06/2016  FINDINGS: Intraoperative crosstable lateral 1 shows a probe overlying the posterior interspinous space at L4-5.  Intraoperative cross-table lateral 2 shows posterior retractors with a probe overlying the spinal canal at the level L4-5.  IMPRESSION: Intraoperative localization of L4-5.  These results were called by telephone at the time of interpretation on 05/23/2016 at 10:38 am to the [place], who verbally acknowledged these results and confirmed that Dr. Louanne Skye was aware these results.   Electronically Signed By: Earle Gell M.D. On: 05/23/2016 10:39  DG Lumbar Spine Complete  Narrative *RADIOLOGY REPORT*  Clinical Data: 54 year old female with low back pain.  LUMBAR SPINE - COMPLETE 4+ VIEW  Comparison: Nuclear medicine whole body bone scan 12/05/2007. Thoracic radiographs 12/05/2007.  Findings: Normal lumbar segmentation.  Mild retrolisthesis of L5 on S1.  Mild to moderate disc space loss at that level and endplate spurring.  Trace retrolisthesis of L4 on L5.  Otherwise normal lumbar vertebral height and alignment.  The other disc spaces are relatively preserved.  No pars fracture.  Grossly intact lower thoracic levels.  Sacral ala and SI joints within normal limits.  IMPRESSION: Chronic disc and endplate degeneration at L5-S1 with mild retrolisthesis. No acute osseous abnormality in the lumbar spine.   Original Report Authenticated By: Roselyn Reef, M.D.  Knee Imaging: DG Knee Complete 4 Views Left  Narrative CLINICAL DATA:  Pain in left knee 6 days after fall; pt states that she fell due to ice on the ground, fell onto her left knee and  felt the knee popped; no previous injury; pain on both lateral and medial sides of the knee, pain radiates to hip and ankle sometime but not much per pt  EXAM: LEFT KNEE - COMPLETE 4+ VIEW  COMPARISON:  None.  FINDINGS: No fracture or dislocation.  There are no significant arthropathic changes.  No joint effusion.  Soft tissues are unremarkable.  IMPRESSION: Negative.   Electronically Signed By: Lajean Manes M.D. On: 07/06/2014 10:53  Complexity Note: Imaging results reviewed. Results shared with Brandy [name], using Layman's terms.                        ROS  Cardiovascular: Heart surgery and Heart murmur Pulmonary or Respiratory: Lung problems and Shortness of breath Neurological: No reported neurological signs or symptoms such as seizures, abnormal skin sensations, urinary and/or fecal incontinence, being born with an abnormal open spine and/or a tethered spinal cord Psychological-Psychiatric: Anxiousness and Prone to panicking Gastrointestinal: Vomiting blood (Ulcers) and Reflux or heatburn Genitourinary: Kidney disease Hematological: Brusing easily Endocrine: No reported endocrine signs or symptoms such as high or low blood sugar, rapid heart rate due to high thyroid levels, obesity or weight gain due to slow thyroid or thyroid disease Rheumatologic: No reported rheumatological signs and symptoms such as fatigue, joint pain, tenderness, swelling, redness, heat, stiffness, decreased range of motion, with or without associated rash Musculoskeletal: Negative for myasthenia gravis, muscular dystrophy, multiple sclerosis or malignant hyperthermia Work History: Disabled  Allergies  Brandy [name] is allergic to zithromax [azithromycin] and psyllium.  Laboratory Chemistry Profile   Renal Lab Results  Component Value Date   BUN 10 11/01/2019   CREATININE 1.35 (H) 11/01/2019   GFRAA 51 (L) 11/01/2019   GFRNONAA 44 (L) 11/01/2019   PROTEINUR NEGATIVE 09/04/2019      Electrolytes Lab Results  Component Value Date   NA 140 11/01/2019   K 3.7 11/01/2019   CL 103 11/01/2019   CALCIUM 9.2 11/01/2019   MG 1.5 (L) 11/01/2019     Hepatic Lab Results  Component Value Date   AST 85 (H) 11/01/2019   ALT 100 (H) 11/01/2019   ALBUMIN 3.3 (L) 11/01/2019   ALKPHOS 168 (H) 11/01/2019   LIPASE 36 09/05/2019     ID Lab Results  Component Value Date   HIV NON REACTIVE 09/05/2019   SARSCOV2NAA NEGATIVE 11/01/2019   STAPHAUREUS NEGATIVE 05/20/2016   MRSAPCR NEGATIVE 05/20/2016     Bone Lab Results  Component Value Date   VD25OH 30.51 06/26/2019     Endocrine Lab Results  Component Value Date   GLUCOSE 127 (H) 11/01/2019   GLUCOSEU NEGATIVE 09/04/2019   TSH 2.083 09/06/2019     Neuropathy Lab Results  Component Value Date   HIV NON REACTIVE 09/05/2019     CNS No results found for: COLORCSF, APPEARCSF, RBCCOUNTCSF, WBCCSF, POLYSCSF, LYMPHSCSF, EOSCSF, PROTEINCSF, GLUCCSF, JCVIRUS, CSFOLI, IGGCSF, LABACHR, ACETBL, LABACHR, ACETBL   Inflammation (CRP: Acute  ESR: Chronic) Lab Results  Component Value Date   CRP 7.7 (H) 08/04/2019   ESRSEDRATE 9 06/27/2017   LATICACIDVEN  2.1 (HH) 11/01/2019     Rheumatology Lab Results  Component Value Date   RF <14 12/29/2016   ANA NEGATIVE 06/27/2017   LABURIC 6.5 12/29/2016     Coagulation Lab Results  Component Value Date   INR 1.0 11/01/2019   LABPROT 12.3 11/01/2019   APTT 33 05/20/2016   PLT 242 11/01/2019   DDIMER 1.11 (H) 08/04/2019     Cardiovascular Lab Results  Component Value Date   BNP 7.5 11/01/2019   CKTOTAL 77 06/04/2009   CKMB 1.1 06/04/2009   TROPONINI <0.03 02/22/2015   HGB 11.1 (L) 11/01/2019   HCT 33.9 (L) 11/01/2019     Screening Lab Results  Component Value Date   SARSCOV2NAA NEGATIVE 11/01/2019   STAPHAUREUS NEGATIVE 05/20/2016   MRSAPCR NEGATIVE 05/20/2016   HIV NON REACTIVE 09/05/2019     Cancer No results found for: CEA, CA125, LABCA2    Allergens No results found for: ALMOND, APPLE, ASPARAGUS, AVOCADO, BANANA, BARLEY, BASIL, BAYLEAF, GREENBEAN, LIMABEAN, WHITEBEAN, BEEFIGE, REDBEET, BLUEBERRY, BROCCOLI, CABBAGE, MELON, CARROT, CASEIN, CASHEWNUT, CAULIFLOWER, CELERY     Note: Lab results reviewed.   [place]  Drug: Brandy [name]  reports no history of drug use. Alcohol:  reports no history of alcohol use. Tobacco:  reports that she quit smoking about 9 months ago. Her smoking use included cigarettes. She smoked 0.50 packs per day. She has never used smokeless tobacco. Medical:  has a past medical history of Arthritis, ASD (atrial septal defect), Cataract, GERD (gastroesophageal reflux disease), Headache, Heart murmur, Legally blind in right eye, as defined in [place], Lumbar herniated disc, and Sciatica. Family: family history includes Cancer in her father; Diabetes in her mother; High blood pressure in her mother.  Past Surgical History:  Procedure Laterality Date  . ABLATION    . BREAST SURGERY Bilateral 2011   Breast Reduction Surgery  . BUNIONECTOMY    . CARDIAC CATHETERIZATION     10/05/04 [facility]): LM < 25%, otherwise normal coronaries. No pulmonary HTN, Mildly enlarged RV. Secundum ASD s/p closure.  Marland Kitchen CARDIAC SURGERY    . CATARACT EXTRACTION    . CLEFT PALATE REPAIR     s/p cleft lip and palate repair  . LUMBAR LAMINECTOMY/DECOMPRESSION MICRODISCECTOMY Left 05/23/2016   Procedure: LEFT L4-L5 LATERAL RECESS DECOMPRESSION WITH CENTRAL AND RIGHT DECOMPRESSION VIA LEFT SIDE;  Surgeon: [name], MD;  Location: [place];  Service: Orthopedics;  Laterality: Left;  . LUMBAR LAMINECTOMY/DECOMPRESSION MICRODISCECTOMY Left 05/23/2016   Procedure: LUMBAR LAMINECTOMY/DECOMPRESSION MICRODISCECTOMY Lumbar five - Sacral One 1 LEVEL;  Surgeon: [name], MD;  Location: [place];  Service: Orthopedics;  Laterality: Left;  . SHOULDER INJECTION Left 05/23/2016   Procedure: SHOULDER INJECTION;  Surgeon: [name], MD;  Location: [place];  Service: Orthopedics;  Laterality: Left;  band-aid per pa-c  . TRANSTHORACIC ECHOCARDIOGRAM     12/15/05 [facility]): Mild LVH, EF > 16%, grade 1 diastolic dysfunction, Trivial MR/PR/TR.   Active Ambulatory Problems    Diagnosis Date Noted  . Spinal stenosis, 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  . Left shoulder tendonitis 05/23/2016  . 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 Acute pansinusitis 08/02/2017  . Anxiety disorder 01/07/2015  . Insomnia 08/02/2017  . Low back pain radiating to both legs 08/02/2017  . Migraine 08/02/2017  . Neuropathy 08/02/2017  . Pneumonitis 08/04/2019  . Breast cancer ([place]) 08/04/2019  . Tachycardia   . Diarrhea 09/04/2019  . GERD (gastroesophageal reflux disease)   . Hyperlipidemia   .  CKD (chronic kidney disease), stage III   . Depression   . Malignant hypertension   . Transaminitis   . Lactic acidosis   . Hepatic steatosis 09/04/2019  . Sepsis ([place]) 09/04/2019  . Nausea & vomiting 09/04/2019  . Obesity (BMI 30-39.9) 09/04/2019  . Infiltrating ductal carcinoma of breast ([place]) 10/08/2019  . Asthma 09/25/2019  . Bilateral leg weakness 01/24/2019  . Ocular proptosis 02/14/2019  . PVD (posterior vitreous detachment), left eye 02/14/2019  . Chorioretinal scar of left eye 02/14/2019  . Vitreous floaters of left eye 02/14/2019  . Chronic pain syndrome 11/11/2019  . Pharmacologic therapy 11/11/2019  . Disorder of skeletal system 11/11/2019  . Problems influencing health status 11/11/2019  . Abnormal MRI, lumbar spine (08/22/2018) 11/11/2019  . Chronic low back pain (1ry area of Pain) (Bilateral) w/ sciatica (Bilateral) 11/11/2019  . DDD (degenerative disc disease), lumbosacral 11/11/2019  . Grade 1 Anterolisthesis (L2-3, L3-4) 11/11/2019  . Grade 1 Retrolisthesis (L4-5, L5-S1) 11/11/2019  . Lumbar facet hypertrophy (Multilevel) (Bilateral) 11/11/2019  . Lumbar facet syndrome (Multilevel) (Bilateral) (R>L)  11/11/2019  . Chronic lower extremity pain (2ry area of Pain) (Bilateral) (R>L) 11/11/2019  . Neurogenic pain 11/11/2019  . Chronic musculoskeletal pain 11/11/2019  . Chronic hip pain (3ry area of Pain) (Bilateral) (R>L) 11/27/2019  . Chronic sacroiliac joint pain (Left) 11/27/2019   Resolved Ambulatory Problems    Diagnosis Date Noted  . No Resolved Ambulatory Problems   Past Medical History:  Diagnosis Date  . Arthritis   . ASD (atrial septal defect)   . Cataract   . Headache   . Heart murmur   . Legally blind in right eye, as defined in [place]   . Lumbar herniated disc   . Sciatica    Constitutional Exam  General appearance: Well nourished, well developed, and well hydrated. In no apparent acute distress Vitals:   11/27/19 0955  BP: 123/70  Pulse: 92  Temp: (!) 97.3 F (36.3 C)  SpO2: 100%  Weight: 230 lb (104.3 kg)  Height: '5\' 7"'$  (1.702 m)   BMI Assessment: Estimated body mass index is 36.02 kg/m as calculated from the following:   Height as of this encounter: '5\' 7"'$  (1.702 m).   Weight as of this encounter: 230 lb (104.3 kg).  BMI interpretation table: BMI level Category Range association with higher incidence of chronic pain  <18 kg/m2 Underweight   18.5-24.9 kg/m2 Ideal body weight   25-29.9 kg/m2 Overweight Increased incidence by 20%  30-34.9 kg/m2 Obese (Class I) Increased incidence by 68%  35-39.9 kg/m2 Severe obesity (Class II) Increased incidence by 136%  >40 kg/m2 Extreme obesity (Class III) Increased incidence by 254%   Patient's current BMI Ideal Body weight  Body mass index is 36.02 kg/m. Ideal body weight: 61.6 kg (135 lb 12.9 oz) Adjusted ideal body weight: 78.7 kg (173 lb 7.7 oz)   BMI Readings from Last 4 Encounters:  11/27/19 36.02 kg/m  11/01/19 36.18 kg/m  11/01/19 36.18 kg/m  10/01/19 36.27 kg/m   Wt Readings from Last 4 Encounters:  11/27/19 230 lb (104.3 kg)  11/01/19 231 lb (104.8 kg)  11/01/19 231 lb (104.8 kg)  10/01/19 231  lb 9.6 oz (105.1 kg)    Psych/Mental status: Alert, oriented x 3 (person, place, & time)       Eyes: The patient is wearing an eye patch over the right eye Respiratory: No evidence of acute respiratory distress  Cervical Spine Exam  Skin & Axial Inspection: No masses,  redness, edema, swelling, or associated skin lesions Alignment: Symmetrical Functional ROM: Unrestricted ROM      Stability: No instability detected Muscle Tone/Strength: Functionally intact. No obvious neuro-muscular anomalies detected. Sensory (Neurological): Unimpaired Palpation: No palpable anomalies              Upper Extremity (UE) Exam    Side: Right upper extremity  Side: Left upper extremity  Skin & Extremity Inspection: Skin color, temperature, and hair growth are WNL. No peripheral edema or cyanosis. No masses, redness, swelling, asymmetry, or associated skin lesions. No contractures.  Skin & Extremity Inspection: Skin color, temperature, and hair growth are WNL. No peripheral edema or cyanosis. No masses, redness, swelling, asymmetry, or associated skin lesions. No contractures.  Functional ROM: Unrestricted ROM          Functional ROM: Unrestricted ROM          Muscle Tone/Strength: Functionally intact. No obvious neuro-muscular anomalies detected.  Muscle Tone/Strength: Functionally intact. No obvious neuro-muscular anomalies detected.  Sensory (Neurological): Unimpaired          Sensory (Neurological): Unimpaired          Palpation: No palpable anomalies              Palpation: No palpable anomalies              Provocative Test(s):  Phalen's test: deferred Tinel's test: deferred Apley's scratch test (touch opposite shoulder):  Action 1 (Across chest): deferred Action 2 (Overhead): deferred Action 3 (LB reach): deferred   Provocative Test(s):  Phalen's test: deferred Tinel's test: deferred Apley's scratch test (touch opposite shoulder):  Action 1 (Across chest): deferred Action 2 (Overhead):  deferred Action 3 (LB reach): deferred    Thoracic Spine Area Exam  Skin & Axial Inspection: No masses, redness, or swelling Alignment: Symmetrical Functional ROM: Unrestricted ROM Stability: No instability detected Muscle Tone/Strength: Functionally intact. No obvious neuro-muscular anomalies detected. Sensory (Neurological): Unimpaired Muscle strength & Tone: No palpable anomalies  Lumbar Exam  Skin & Axial Inspection: Well healed scar from previous spine surgery detected Alignment: Symmetrical Functional ROM: Decreased ROM affecting both sides Stability: No instability detected Muscle Tone/Strength: Increased muscle tone over affected area Sensory (Neurological): Movement-associated pain Palpation: Complains of area being tender to palpation       Provocative Tests: Hyperextension/rotation test: (+) bilaterally for facet joint pain. Lumbar quadrant test (Kemp's test): (+) bilaterally for facet joint pain. Lateral bending test: (-)       Patrick's Maneuver: (+) for left-sided S-I arthralgia and for bilateral hip arthralgia (R>L) FABER* test: (+) for left-sided S-I arthralgia and for bilateral hip arthralgia (R>L) S-I anterior distraction/compression test: deferred today         S-I lateral compression test: deferred today         S-I Thigh-thrust test: deferred today         S-I Gaenslen's test: deferred today         *(Flexion, ABduction and External Rotation)  Gait & Posture Assessment  Ambulation: Patient ambulates using a cane Gait: Limited. Using assistive device to ambulate Posture: Difficulty standing up straight, due to pain   Lower Extremity Exam    Side: Right lower extremity  Side: Left lower extremity  Stability: No instability observed          Stability: No instability observed          Skin & Extremity Inspection: Skin color, temperature, and hair growth are WNL. No peripheral edema or cyanosis. No masses,  redness, swelling, asymmetry, or associated skin  lesions. No contractures.  Skin & Extremity Inspection: Skin color, temperature, and hair growth are WNL. No peripheral edema or cyanosis. No masses, redness, swelling, asymmetry, or associated skin lesions. No contractures.  Functional ROM: Diminished ROM for hip and knee joints          Functional ROM: Diminished ROM for hip and knee joints          Muscle Tone/Strength: Able to Toe-walk & Heel-walk without problems  Muscle Tone/Strength: Able to Toe-walk & Heel-walk without problems  Sensory (Neurological): Referred pain pattern        Sensory (Neurological): Referred pain pattern        DTR: Patellar: 1+: trace Achilles: 1+: trace Plantar: deferred today  DTR: Patellar: 1+: trace Achilles: 2+: normal Plantar: deferred today  Palpation: No palpable anomalies  Palpation: No palpable anomalies   Assessment  Primary Diagnosis & Pertinent Problem List: The primary encounter diagnosis was Chronic pain syndrome. 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  Visit Diagnosis (New problems to examiner): 1. Chronic pain syndrome   2. Chronic low back pain (Bilateral) w/ sciatica (Bilateral)   3. DDD (degenerative disc disease), lumbosacral   4. Lumbar facet hypertrophy (Multilevel) (Bilateral)   5. Lumbar facet syndrome (Multilevel) (Bilateral) (R>L)   6. Chronic sacroiliac joint pain (Left)   7. Failed back surgical syndrome  (L4-5, L5-S1)   8. Chronic lower extremity pain (2ry area of Pain) (Bilateral) (R>L)   9. Abnormal MRI, lumbar spine (08/22/2018)   10. Chronic hip pain (3ry area of Pain) (Bilateral) (R>L)   11. Chronic musculoskeletal pain   12. Pharmacologic therapy   13. Disorder of skeletal system   14. Problems influencing health status    Plan of Care (Initial workup plan)  Note: Brandy [name] was reminded that as per protocol, today's visit has been an evaluation only. We have not taken over the patient's controlled substance management.  Problem-specific plan: No problem-specific Assessment & Plan notes found for this encounter.   Lab Orders     Compliance Drug Analysis, Ur     Vitamin B12     Sedimentation rate     25-Hydroxy vitamin D Lcms D2+D3     C-reactive protein  Imaging Orders     DG Lumbar Spine Complete W/Bend     DG HIP UNILAT W OR W/O PELVIS 2-3 VIEWS RIGHT     DG HIP UNILAT W OR W/O PELVIS 2-3 VIEWS LEFT     DG Si Joints Referral Orders  No referral(s) requested today   Procedure Orders    No procedure(s) ordered today   Pharmacotherapy (current): Medications ordered:  No orders of the defined types were placed in this encounter.  Medications administered during this visit: Brandy [name]. [place] had no medications administered during this visit.   Pharmacological management options:  Opioid Analgesics: The patient was informed that there is no guarantee that she would be a candidate for opioid analgesics. The decision will be made following CDC guidelines. This decision will be based on  the results of diagnostic studies, as well as Ms. [name]'s risk profile.   Membrane stabilizer: To be determined at a later time  Muscle relaxant: To be determined at a later time  NSAID: To be determined at a later time  Other analgesic(s): To be determined at a later time   Interventional management options: Brandy [name] was informed that there is no guarantee that she  would be a candidate for interventional therapies. The decision will be based on the results of diagnostic studies, as well as Ms. [name]'s risk profile.  Procedure(s) under consideration:  Diagnostic bilateral lumbar facet block  Diagnostic caudal ESI + epidurogram  Diagnostic left sacroiliac joint block  Diagnostic right IA hip joint injection    Provider-requested follow-up: Return for (F2F), (2V), (s/p Tests).  Future Appointments  Date Time Provider [place]  12/20/2019 10:00 AM [facility] LAB 1 [facility] None  12/20/2019 10:40 AM Brunetta Genera, MD [facility] None  12/25/2019  9:45 AM Milinda Pointer, MD [facility] None    Note by: Gaspar Cola, MD Date: 11/27/2019; Time: 11:31 AM

## 2019-11-27 ENCOUNTER — Ambulatory Visit: Payer: BC Managed Care – PPO | Admitting: Pain Medicine

## 2019-11-27 ENCOUNTER — Ambulatory Visit
Admission: RE | Admit: 2019-11-27 | Discharge: 2019-11-27 | Disposition: A | Discharge status: Home / Self Care | Payer: BC Managed Care – PPO | Source: Ambulatory Visit | Attending: Pain Medicine | Admitting: Pain Medicine

## 2019-11-27 ENCOUNTER — Other Ambulatory Visit: Payer: Self-pay

## 2019-11-27 ENCOUNTER — Ambulatory Visit
Admission: RE | Admit: 2019-11-27 | Discharge: 2019-11-27 | Disposition: A | Discharge status: Home / Self Care | Payer: BC Managed Care – PPO | Attending: Pain Medicine | Admitting: Pain Medicine

## 2019-11-27 ENCOUNTER — Encounter: Payer: Self-pay | Admitting: Pain Medicine

## 2019-11-27 VITALS — BP 123/70 | HR 92 | Temp 97.3°F | Ht 67.0 in | Wt 230.0 lb

## 2019-11-27 DIAGNOSIS — M961 Postlaminectomy syndrome, not elsewhere classified: Secondary | ICD-10-CM | POA: Insufficient documentation

## 2019-11-27 DIAGNOSIS — G894 Chronic pain syndrome: Secondary | ICD-10-CM | POA: Diagnosis not present

## 2019-11-27 DIAGNOSIS — M25552 Pain in left hip: Secondary | ICD-10-CM | POA: Insufficient documentation

## 2019-11-27 DIAGNOSIS — M79604 Pain in right leg: Secondary | ICD-10-CM

## 2019-11-27 DIAGNOSIS — M899 Disorder of bone, unspecified: Secondary | ICD-10-CM

## 2019-11-27 DIAGNOSIS — R937 Abnormal findings on diagnostic imaging of other parts of musculoskeletal system: Secondary | ICD-10-CM

## 2019-11-27 DIAGNOSIS — Z79899 Other long term (current) drug therapy: Secondary | ICD-10-CM

## 2019-11-27 DIAGNOSIS — M5441 Lumbago with sciatica, right side: Secondary | ICD-10-CM

## 2019-11-27 DIAGNOSIS — M533 Sacrococcygeal disorders, not elsewhere classified: Secondary | ICD-10-CM

## 2019-11-27 DIAGNOSIS — G8929 Other chronic pain: Secondary | ICD-10-CM | POA: Insufficient documentation

## 2019-11-27 DIAGNOSIS — M25551 Pain in right hip: Secondary | ICD-10-CM | POA: Insufficient documentation

## 2019-11-27 DIAGNOSIS — M79605 Pain in left leg: Secondary | ICD-10-CM | POA: Insufficient documentation

## 2019-11-27 DIAGNOSIS — M7918 Myalgia, other site: Secondary | ICD-10-CM

## 2019-11-27 DIAGNOSIS — Z789 Other specified health status: Secondary | ICD-10-CM

## 2019-11-27 DIAGNOSIS — M47816 Spondylosis without myelopathy or radiculopathy, lumbar region: Secondary | ICD-10-CM

## 2019-11-27 DIAGNOSIS — M5442 Lumbago with sciatica, left side: Secondary | ICD-10-CM

## 2019-11-27 DIAGNOSIS — M5137 Other intervertebral disc degeneration, lumbosacral region: Secondary | ICD-10-CM | POA: Insufficient documentation

## 2019-11-27 NOTE — Patient Instructions (Addendum)
____________________________________________________________________________________________  General Risks and Possible Complications  Patient Responsibilities: It is important that you read this as it is part of your informed consent. It is our duty to inform you of the risks and possible complications associated with treatments offered to you. It is your responsibility as a patient to read this and to ask questions about anything that is not clear or that you believe was not covered in this document.  Patient's Rights: You have the right to refuse treatment. You also have the right to change your mind, even after initially having agreed to have the treatment done. However, under this last option, if you wait until the last second to change your mind, you may be charged for the materials used up to that point.  Introduction: Medicine is not an exact science. Everything in Medicine, including the lack of treatment(s), carries the potential for danger, harm, or loss (which is by definition: Risk). In Medicine, a complication is a secondary problem, condition, or disease that can aggravate an already existing one. All treatments carry the risk of possible complications. The fact that a side effects or complications occurs, does not imply that the treatment was conducted incorrectly. It must be clearly understood that these can happen even when everything is done following the highest safety standards.  No treatment: You can choose not to proceed with the proposed treatment alternative. The "PRO(s)" would include: avoiding the risk of complications associated with the therapy. The "CON(s)" would include: not getting any of the treatment benefits. These benefits fall under one of three categories: diagnostic; therapeutic; and/or palliative. Diagnostic benefits include: getting information which can ultimately lead to improvement of the disease or symptom(s). Therapeutic benefits are those associated with the  successful treatment of the disease. Finally, palliative benefits are those related to the decrease of the primary symptoms, without necessarily curing the condition (example: decreasing the pain from a flare-up of a chronic condition, such as incurable terminal cancer).  General Risks and Complications: These are associated to most interventional treatments. They can occur alone, or in combination. They fall under one of the following six (6) categories: no benefit or worsening of symptoms; bleeding; infection; nerve damage; allergic reactions; and/or death. 1. No benefits or worsening of symptoms: In Medicine there are no guarantees, only probabilities. No healthcare provider can ever guarantee that a medical treatment will work, they can only state the probability that it may. Furthermore, there is always the possibility that the condition may worsen, either directly, or indirectly, as a consequence of the treatment. 2. Bleeding: This is more common if the patient is taking a blood thinner, either prescription or over the counter (example: Goody Powders, Fish oil, Aspirin, Garlic, etc.), or if suffering a condition associated with impaired coagulation (example: Hemophilia, cirrhosis of the liver, low platelet counts, etc.). However, even if you do not have one on these, it can still happen. If you have any of these conditions, or take one of these drugs, make sure to notify your treating physician. 3. Infection: This is more common in patients with a compromised immune system, either due to disease (example: diabetes, cancer, human immunodeficiency virus [HIV], etc.), or due to medications or treatments (example: therapies used to treat cancer and rheumatological diseases). However, even if you do not have one on these, it can still happen. If you have any of these conditions, or take one of these drugs, make sure to notify your treating physician. 4. Nerve Damage: This is more common when the   treatment is  an invasive one, but it can also happen with the use of medications, such as those used in the treatment of cancer. The damage can occur to small secondary nerves, or to large primary ones, such as those in the spinal cord and brain. This damage may be temporary or permanent and it may lead to impairments that can range from temporary numbness to permanent paralysis and/or brain death. 5. Allergic Reactions: Any time a substance or material comes in contact with our body, there is the possibility of an allergic reaction. These can range from a mild skin rash (contact dermatitis) to a severe systemic reaction (anaphylactic reaction), which can result in death. 6. Death: In general, any medical intervention can result in death, most of the time due to an unforeseen complication. ____________________________________________________________________________________________   ______________________________________________________________________________________________  Specialty Pain Scale  Introduction:  There are significant differences in how pain is reported. The word pain usually refers to physical pain, but it is also a common synonym of suffering. The medical community uses a scale from 0 (zero) to 10 (ten) to report pain level. Zero (0) is described as "no pain", while ten (10) is described as "the worse pain you can imagine". The problem with this scale is that physical pain is reported along with suffering. Suffering refers to mental pain, or more often yet it refers to any unpleasant feeling, emotion or aversion associated with the perception of harm or threat of harm. It is the psychological component of pain.  Pain Specialists prefer to separate the two components. The pain scale used by this practice is the Verbal Numerical Rating Scale (VNRS-11). This scale is for the physical pain only. DO NOT INCLUDE how your pain psychologically affects you. This scale is for adults 69 years of age and  older. It has 11 (eleven) levels. The 1st level is 0/10. This means: "right now, I have no pain". In the context of pain management, it also means: "right now, my physical pain is under control with the current therapy".  General Information:  The scale should reflect your current level of pain. Unless you are specifically asked for the level of your worst pain, or your average pain. If you are asked for one of these two, then it should be understood that it is over the past 24 hours.  Levels 1 (one) through 5 (five) are described below, and can be treated as an outpatient. Ambulatory pain management facilities such as ours are more than adequate to treat these levels. Levels 6 (six) through 10 (ten) are also described below, however, these must be treated as a hospitalized patient. While levels 6 (six) and 7 (seven) may be evaluated at an urgent care facility, levels 8 (eight) through 10 (ten) constitute medical emergencies and as such, they belong in a hospital's emergency department. When having these levels (as described below), do not come to our office. Our facility is not equipped to manage these levels. Go directly to an urgent care facility or an emergency department to be evaluated.  Definitions:  Activities of Daily Living (ADL): Activities of daily living (ADL or ADLs) is a term used in healthcare to refer to people's daily self-care activities. Health professionals often use a person's ability or inability to perform ADLs as a measurement of their functional status, particularly in regard to people post injury, with disabilities and the elderly. There are two ADL levels: Basic and Instrumental. Basic Activities of Daily Living (BADL  or BADLs) consist of self-care  tasks that include: Bathing and showering; personal hygiene and grooming (including brushing/combing/styling hair); dressing; Toilet hygiene (getting to the toilet, cleaning oneself, and getting back up); eating and self-feeding (not  including cooking or chewing and swallowing); functional mobility, often referred to as "transferring", as measured by the ability to walk, get in and out of bed, and get into and out of a chair; the broader definition (moving from one place to another while performing activities) is useful for people with different physical abilities who are still able to get around independently. Basic ADLs include the things many people do when they get up in the morning and get ready to go out of the house: get out of bed, go to the toilet, bathe, dress, groom, and eat. On the average, loss of function typically follows a particular order. Hygiene is the first to go, followed by loss of toilet use and locomotion. The last to go is the ability to eat. When there is only one remaining area in which the person is independent, there is a 62.9% chance that it is eating and only a 3.5% chance that it is hygiene. Instrumental Activities of Daily Living (IADL or IADLs) are not necessary for fundamental functioning, but they let an individual live independently in a community. IADL consist of tasks that include: cleaning and maintaining the house; home establishment and maintenance; care of others (including selecting and supervising caregivers); care of pets; child rearing; managing money; managing financials (investments, etc.); meal preparation and cleanup; shopping for groceries and necessities; moving within the community; safety procedures and emergency responses; health management and maintenance (taking prescribed medications); and using the telephone or other form of communication.  Instructions:  Most patients tend to report their pain as a combination of two factors, their physical pain and their psychosocial pain. This last one is also known as "suffering" and it is reflection of how physical pain affects you socially and psychologically. From now on, report them separately.  From this point on, when asked to report  your pain level, report only your physical pain. Use the following table for reference.  Pain Clinic Pain Levels (0-5/10)  Pain Level Score  Description  No Pain 0   Mild pain 1 Nagging, annoying, but does not interfere with basic activities of daily living (ADL). Patients are able to eat, bathe, get dressed, toileting (being able to get on and off the toilet and perform personal hygiene functions), transfer (move in and out of bed or a chair without assistance), and maintain continence (able to control bladder and bowel functions). Blood pressure and heart rate are unaffected. A normal heart rate for a healthy adult ranges from 60 to 100 bpm (beats per minute).   Mild to moderate pain 2 Noticeable and distracting. Impossible to hide from other people. More frequent flare-ups. Still possible to adapt and function close to normal. It can be very annoying and may have occasional stronger flare-ups. With discipline, patients may get used to it and adapt.   Moderate pain 3 Interferes significantly with activities of daily living (ADL). It becomes difficult to feed, bathe, get dressed, get on and off the toilet or to perform personal hygiene functions. Difficult to get in and out of bed or a chair without assistance. Very distracting. With effort, it can be ignored when deeply involved in activities.   Moderately severe pain 4 Impossible to ignore for more than a few minutes. With effort, patients may still be able to manage work or participate in  some social activities. Very difficult to concentrate. Signs of autonomic nervous system discharge are evident: dilated pupils (mydriasis); mild sweating (diaphoresis); sleep interference. Heart rate becomes elevated (>115 bpm). Diastolic blood pressure (lower number) rises above 100 mmHg. Patients find relief in laying down and not moving.   Severe pain 5 Intense and extremely unpleasant. Associated with frowning face and frequent crying. Pain overwhelms the  senses.  Ability to do any activity or maintain social relationships becomes significantly limited. Conversation becomes difficult. Pacing back and forth is common, as getting into a comfortable position is nearly impossible. Pain wakes you up from deep sleep. Physical signs will be obvious: pupillary dilation; increased sweating; goosebumps; brisk reflexes; cold, clammy hands and feet; nausea, vomiting or dry heaves; loss of appetite; significant sleep disturbance with inability to fall asleep or to remain asleep. When persistent, significant weight loss is observed due to the complete loss of appetite and sleep deprivation.  Blood pressure and heart rate becomes significantly elevated. Caution: If elevated blood pressure triggers a pounding headache associated with blurred vision, then the patient should immediately seek attention at an urgent or emergency care unit, as these may be signs of an impending stroke.    Emergency Department Pain Levels (6-10/10)  Emergency Room Pain 6 Severely limiting. Requires emergency care and should not be seen or managed at an outpatient pain management facility. Communication becomes difficult and requires great effort. Assistance to reach the emergency department may be required. Facial flushing and profuse sweating along with potentially dangerous increases in heart rate and blood pressure will be evident.   Distressing pain 7 Self-care is very difficult. Assistance is required to transport, or use restroom. Assistance to reach the emergency department will be required. Tasks requiring coordination, such as bathing and getting dressed become very difficult.   Disabling pain 8 Self-care is no longer possible. At this level, pain is disabling. The individual is unable to do even the most "basic" activities such as walking, eating, bathing, dressing, transferring to a bed, or toileting. Fine motor skills are lost. It is difficult to think clearly.   Incapacitating pain  9 Pain becomes incapacitating. Thought processing is no longer possible. Difficult to remember your own name. Control of movement and coordination are lost.   The worst pain imaginable 10 At this level, most patients pass out from pain. When this level is reached, collapse of the autonomic nervous system occurs, leading to a sudden drop in blood pressure and heart rate. This in turn results in a temporary and dramatic drop in blood flow to the brain, leading to a loss of consciousness. Fainting is one of the body's self defense mechanisms. Passing out puts the brain in a calmed state and causes it to shut down for a while, in order to begin the healing process.    Summary: 1.   Refer to this scale when providing [place] with your pain level. 2.   Be accurate and careful when reporting your pain level. This will help with your care. 3.   Over-reporting your pain level will lead to loss of credibility. 4.   Even a level of 1/10 means that there is pain and will be treated at our facility. 5.   High, inaccurate reporting will be documented as "Symptom Exaggeration", leading to loss of credibility and suspicions of possible secondary gains such as obtaining more narcotics, or wanting to appear disabled, for fraudulent reasons. 6.   Only pain levels of 5 or below will be seen  at our facility. 7.   Pain levels of 6 and above will be sent to the Emergency Department and the appointment cancelled.  ______________________________________________________________________________________________

## 2019-11-30 LAB — COMPLIANCE DRUG ANALYSIS, UR

## 2019-12-02 LAB — 25-HYDROXY VITAMIN D LCMS D2+D3
25-Hydroxy, Vitamin D-2: 46 ng/mL
25-Hydroxy, Vitamin D-3: 4.1 ng/mL
25-Hydroxy, Vitamin D: 50 ng/mL

## 2019-12-02 LAB — C-REACTIVE PROTEIN: CRP: 16 mg/L — ABNORMAL HIGH (ref 0–10)

## 2019-12-02 LAB — VITAMIN B12: Vitamin B-12: 517 pg/mL (ref 232–1245)

## 2019-12-02 LAB — SEDIMENTATION RATE: Sed Rate: 22 mm/hr (ref 0–40)

## 2019-12-04 ENCOUNTER — Telehealth: Payer: Self-pay | Admitting: *Deleted

## 2019-12-04 NOTE — Telephone Encounter (Signed)
Contacted patient - patient scheduled for PFT at [facility] on 7/14 @ 2pm (per [facility] in Respiratory Therapy). Patient also scheduled for Covid test at [facility] on 2/12 at 10:10 am. Patient contacted with all information and verbalized understanding of times.

## 2019-12-16 ENCOUNTER — Other Ambulatory Visit (HOSPITAL_COMMUNITY)
Admission: RE | Admit: 2019-12-16 | Discharge: 2019-12-16 | Disposition: A | Discharge status: Home / Self Care | Payer: BLUE CROSS/BLUE SHIELD | Source: Ambulatory Visit | Attending: Hematology | Admitting: Hematology

## 2019-12-16 DIAGNOSIS — Z20822 Contact with and (suspected) exposure to covid-19: Secondary | ICD-10-CM | POA: Diagnosis not present

## 2019-12-16 DIAGNOSIS — Z01812 Encounter for preprocedural laboratory examination: Secondary | ICD-10-CM | POA: Insufficient documentation

## 2019-12-16 LAB — SARS CORONAVIRUS 2 (TAT 6-24 HRS): SARS Coronavirus 2: NEGATIVE

## 2019-12-17 ENCOUNTER — Encounter: Payer: Self-pay | Admitting: Hematology

## 2019-12-18 ENCOUNTER — Other Ambulatory Visit: Payer: Self-pay

## 2019-12-18 ENCOUNTER — Ambulatory Visit (HOSPITAL_COMMUNITY)
Admission: RE | Admit: 2019-12-18 | Discharge: 2019-12-18 | Disposition: A | Discharge status: Home / Self Care | Payer: BLUE CROSS/BLUE SHIELD | Source: Ambulatory Visit | Attending: Hematology | Admitting: Hematology

## 2019-12-18 DIAGNOSIS — J449 Chronic obstructive pulmonary disease, unspecified: Secondary | ICD-10-CM | POA: Insufficient documentation

## 2019-12-18 LAB — PULMONARY FUNCTION TEST
DL/VA % pred: 85 %
DL/VA: 3.58 ml/min/mmHg/L
DLCO unc % pred: 58 %
DLCO unc: 13.27 ml/min/mmHg
FEF 25-75 Post: 3.4 L/sec
FEF 25-75 Pre: 3.62 L/sec
FEF2575-%Change-Post: -5 %
FEF2575-%Pred-Post: 137 %
FEF2575-%Pred-Pre: 145 %
FEV1-%Change-Post: 0 %
FEV1-%Pred-Post: 92 %
FEV1-%Pred-Pre: 92 %
FEV1-Post: 2.29 L
FEV1-Pre: 2.3 L
FEV1FVC-%Change-Post: 3 %
FEV1FVC-%Pred-Pre: 108 %
FEV6-%Change-Post: -3 %
FEV6-%Pred-Post: 83 %
FEV6-%Pred-Pre: 86 %
FEV6-Post: 2.53 L
FEV6-Pre: 2.62 L
FEV6FVC-%Pred-Post: 102 %
FEV6FVC-%Pred-Pre: 102 %
FVC-%Change-Post: -3 %
FVC-%Pred-Post: 80 %
FVC-%Pred-Pre: 83 %
FVC-Post: 2.53 L
FVC-Pre: 2.62 L
Post FEV1/FVC ratio: 91 %
Post FEV6/FVC ratio: 100 %
Pre FEV1/FVC ratio: 88 %
Pre FEV6/FVC Ratio: 100 %
RV % pred: 76 %
RV: 1.57 L
TLC % pred: 78 %
TLC: 4.29 L

## 2019-12-18 MED ORDER — ALBUTEROL SULFATE (2.5 MG/3ML) 0.083% IN NEBU
2.5000 mg | INHALATION_SOLUTION | Freq: Once | RESPIRATORY_TRACT | Status: AC
  Administered 2019-12-18: 2.5 mg via RESPIRATORY_TRACT

## 2019-12-18 NOTE — Progress Notes (Signed)
HEMATOLOGY/ONCOLOGY CLINIC NOTE  Date of Service: 12/20/2019  Patient Care Team: [facility] as PCP - General Everardo Beals, NP as Nurse Practitioner [facility]  CHIEF COMPLAINTS/PURPOSE OF CONSULTATION:  -f/u for metastatic breast cancer   HISTORY OF PRESENTING ILLNESS:  Brandy [name] is a wonderful 54 y.o. female who has been referred to [place] by [facility]* for evaluation and management of her suspected breast cancer.   The pt reports that she was having increasing low back pain on her right side that would not resolve, so she went to the ED. This pain has not resolved. She notes pain in her hips and knees.   Of note prior to the patient's visit today, pt has had an abdominal limited right upper ultrasound completed on 02/18/2019 with results revealing "Probable hepatic steatosis. No other abnormality seen in the right upper quadrant of the abdomen."  Pt had abdomen and pelvis CT completed on 02/18/2019 with results revealing "5cm spiculated soft tissue mass in inferior right breast, highly suspicious for primary breast carcinoma. No acute findings or metastatic disease within the abdomen or pelvis. Multiple small pulmonary nodules in both lung bases, consistent with pulmonary metastases. 4.5 cm uterine fibroid."  Most recent lab results (02/18/2019) of CBC is as follows: all values are WNL except for glucose at 121, calcium at 8.7, AST at 49, and ALT at 53.  On review of systems, pt reports pedal edema and denies chest pain, shortness of breath, weight loss, and any other symptoms.  Her last known mammography was on 06/18/2012.   On Social Hx the pt reports that she is a smoker and she smokes about half a pack per day. She does not drink alcohol. She has three children: age 49, 55, and 47.  On Family Hx the pt reports lung cancer. Maternal Cousin passed from stomach cancer at the age of 70.    INTERVAL HISTORY:  Brandy [name] is a 54 y.o. female here for evaluation and management of metastatic breast cancer. The patient's last visit with [place] was on 11/20/19. The pt reports that she is doing well overall.  The pt reports she is good. Pt is seeing the pulmonologist on August 26th. She is off the verzenio. Pt is able to take her Letrozole well. Her breathing has been the same and her fatigue has been slightly better. She attributes some of the fatigue to the heat, humidity and other life stressors. Pt is going 2-3 times daily with soft stool. She is trying to eat better. Today pt has been nauseous and has a headache. She has fallen 4 times within the last several weeks due to her loosing her footing. Pt has arthritis and is in pain all the time. She had a boil on her right chest wall that she popped yesterday. Her nails have been breaking. She takes a Vitamin D, a multi vitamin and hair, skin and nail vitamin. Pt wants to go outside and be active but she has several stressors and pains causing her not to be able to.   Of note since the patient's last visit, pt has had Pulmonary Function Test completed on 12/18/19 with results revealing "The reduced lung volumes, increased FEV1/FVC ratio and diffusion defect suggest an interstitial process such as fibrosis or interstitial inflammation. Anemia cannot be excluded as a potential cause of the diffusion defect without correcting the observed diffusing capacity for hemoglobin. In view of the severity of the diffusion defect,  studies with exercise would be helpful to evaluate the presence of hypoxemia."  Lab results today (12/20/19) of CBC w/diff and CMP is as follows: all values are WNL except for RBC at 3.43, Hemoglobin at 11.4, HCT at 35.1, MCV at 102.3, Glucose at 160, Creatinine at 1.12, AST at 61, ALT at 79, Alkaline Phosphatase at 149, GFR, Est Non Af Am at 55  On review of systems, pt reports loose stool, fatigue, breathing difficulty, headache, nausea, general pain,  falls, breast pain/discomfort/burning and denies new lumps/bumps, abdominal pain and any other symptoms.   MEDICAL HISTORY:  Past Medical History:  Diagnosis Date  . Arthritis   . ASD (atrial septal defect)    s/p closure with Amplatzer device 10/05/04 (Dr. Myriam Jacobson, [facility]) 10/05/04  . Cataract   . GERD (gastroesophageal reflux disease)   . Headache   . Heart murmur    no longer heard  . Legally blind in right eye, as defined in [place]   . Lumbar herniated disc   . Sciatica   Uterine Fibroids  SURGICAL HISTORY: Past Surgical History:  Procedure Laterality Date  . ABLATION    . BREAST SURGERY Bilateral 2011   Breast Reduction Surgery  . BUNIONECTOMY    . CARDIAC CATHETERIZATION     10/05/04 [facility]): LM < 25%, otherwise normal coronaries. No pulmonary HTN, Mildly enlarged RV. Secundum ASD s/p closure.  Marland Kitchen CARDIAC SURGERY    . CATARACT EXTRACTION    . CLEFT PALATE REPAIR     s/p cleft lip and palate repair  . LUMBAR LAMINECTOMY/DECOMPRESSION MICRODISCECTOMY Left 05/23/2016   Procedure: LEFT L4-L5 LATERAL RECESS DECOMPRESSION WITH CENTRAL AND RIGHT DECOMPRESSION VIA LEFT SIDE;  Surgeon: [name], MD;  Location: [place];  Service: Orthopedics;  Laterality: Left;  . LUMBAR LAMINECTOMY/DECOMPRESSION MICRODISCECTOMY Left 05/23/2016   Procedure: LUMBAR LAMINECTOMY/DECOMPRESSION MICRODISCECTOMY Lumbar five - Sacral One 1 LEVEL;  Surgeon: [name], MD;  Location: [place];  Service: Orthopedics;  Laterality: Left;  . SHOULDER INJECTION Left 05/23/2016   Procedure: SHOULDER INJECTION;  Surgeon: [name], MD;  Location: [place];  Service: Orthopedics;  Laterality: Left;  band-aid per pa-c  . TRANSTHORACIC ECHOCARDIOGRAM     12/15/05 [facility]): Mild LVH, EF > 93%, grade 1 diastolic dysfunction, Trivial MR/PR/TR.  Endometrial ablation 2003 Breast Reduction, bilateral 2011  SOCIAL HISTORY: Social History   Socioeconomic History  . Marital status: Married    Spouse name: Not on file   . Number of children: 3  . Years of education: Not on file  . Highest education level: Not on file  Occupational History  . Not on file  Tobacco Use  . Smoking status: Former Smoker    Packs/day: 0.50    Types: Cigarettes    Quit date: 02/05/2019    Years since quitting: 0.8  . Smokeless tobacco: Never Used  Vaping Use  . Vaping Use: Never used  Substance and Sexual Activity  . Alcohol use: No  . Drug use: No  . Sexual activity: Not on file  Other Topics Concern  . Not on file  Social History Narrative  . Not on file   Social Determinants of Health   Financial Resource Strain:   . Difficulty of Paying Living Expenses:   Food Insecurity:   . Worried About Charity fundraiser in the Last Year:   . Arboriculturist in the Last Year:   Transportation Needs:   . Film/video editor (Medical):   Marland Kitchen  Lack of Transportation (Non-Medical):   Physical Activity:   . Days of Exercise per Week:   . Minutes of Exercise per Session:   Stress:   . Feeling of Stress :   Social Connections:   . Frequency of Communication with Friends and Family:   . Frequency of Social Gatherings with Friends and Family:   . Attends Religious Services:   . Active Member of Clubs or Organizations:   . Attends Archivist Meetings:   Marland Kitchen Marital Status:   Intimate Partner Violence:   . Fear of Current or Ex-Partner:   . Emotionally Abused:   Marland Kitchen Physically Abused:   . Sexually Abused:     FAMILY HISTORY: Family History  Problem Relation Age of Onset  . Diabetes Mother   . High blood pressure Mother   . Cancer Father        Lung  . Colon cancer Neg Hx   . Colon polyps Neg Hx   . Esophageal cancer Neg Hx   . Rectal cancer Neg Hx   . Stomach cancer Neg Hx     ALLERGIES:  is allergic to zithromax [azithromycin] and psyllium.  MEDICATIONS:  Current Outpatient Medications  Medication Sig Dispense Refill  . albuterol (PROVENTIL) (2.5 MG/3ML) 0.083% nebulizer solution Take 3 mLs (2.5  mg total) by nebulization every 6 (six) hours as needed for wheezing or shortness of breath. (Patient not taking: Reported on 12/20/2019) 75 mL 2  . albuterol (VENTOLIN HFA) 108 (90 Base) MCG/ACT inhaler Inhale 2 puffs into the lungs every 6 (six) hours as needed for wheezing or shortness of breath. 18 g 5  . ALPRAZolam (XANAX) 1 MG tablet Take 1 mg by mouth daily as needed for anxiety.     Marland Kitchen amitriptyline (ELAVIL) 25 MG tablet Take 3 tablets (75 mg total) by mouth at bedtime. 30 tablet 0  . atorvastatin (LIPITOR) 10 MG tablet Take 10 mg by mouth daily. (Patient not taking: Reported on 12/20/2019)    . benzonatate (TESSALON) 100 MG capsule Take 1 capsule (100 mg total) by mouth 3 (three) times daily as needed for cough. 60 capsule 0  . Cyanocobalamin (VITAMIN B 12 PO) Take 1 tablet by mouth daily. (Patient not taking: Reported on 11/27/2019)    . doxycycline (VIBRAMYCIN) 100 MG capsule doxycycline hyclate 100 mg capsule  TAKE 1 CAPSULE BY MOUTH TWICE DAILY FOR 10 DAYS AS DIRECTED    . ergocalciferol (VITAMIN D2) 1.25 MG (50000 UT) capsule Take 1 capsule (50,000 Units total) by mouth once a week. 12 capsule 3  . Ferrous Sulfate (IRON PO) Take 1 tablet by mouth daily. (Patient not taking: Reported on 11/27/2019)    . fluticasone (FLOVENT HFA) 110 MCG/ACT inhaler Inhale 2 puffs into the lungs 2 (two) times daily. 1 Inhaler 5  . guaiFENesin (MUCINEX) 600 MG 12 hr tablet Take 1 tablet (600 mg total) by mouth 2 (two) times daily. 60 tablet 2  . guaiFENesin-dextromethorphan (ROBITUSSIN DM) 100-10 MG/5ML syrup Take 10 mLs by mouth every 4 (four) hours as needed for cough. (Patient not taking: Reported on 11/27/2019) 118 mL 0  . HYDROcodone-acetaminophen (NORCO/VICODIN) 5-325 MG tablet Take 1-2 tablets by mouth every 6 (six) hours as needed for moderate pain. (Patient not taking: Reported on 11/27/2019)    . HYDROcodone-homatropine (HYCODAN) 5-1.5 MG/5ML syrup Take 5 mLs by mouth every 6 (six) hours as needed for  cough. (Patient not taking: Reported on 10/01/2019) 120 mL 0  . letrozole (FEMARA) 2.5 MG  tablet TAKE 1 TABLET BY MOUTH ONCE DAILY (Patient taking differently: Take 2.5 mg by mouth daily. ) 30 tablet 5  . Magnesium 500 MG TABS Take 1 tablet (500 mg total) by mouth in the morning and at bedtime. 60 tablet 1  . omeprazole (PRILOSEC) 10 MG capsule Take 20 mg by mouth daily.     . ondansetron (ZOFRAN) 8 MG tablet Take 1 tablet (8 mg total) by mouth every 8 (eight) hours as needed for nausea or vomiting. 30 tablet 3  . potassium chloride SA (KLOR-CON) 20 MEQ tablet potassium chloride ER 20 mEq tablet,extended release  TAKE 1 TABLET BY MOUTH EVERY DAY AS DIRECTED    . prednisoLONE acetate (PRED FORTE) 1 % ophthalmic suspension Place 1 drop into the left eye 4 (four) times daily.    Marland Kitchen VERZENIO 100 MG tablet TAKE 1 TABLET (100 MG TOTAL) BY MOUTH 2 (TWO) TIMES DAILY. (Patient taking differently: Take 100 mg by mouth 2 (two) times daily. ) 56 tablet 0   Current Facility-Administered Medications  Medication Dose Route Frequency Provider Last Rate Last Admin  . 0.9 %  sodium chloride infusion  500 mL Intravenous Continuous Nandigam, Venia Minks, MD        REVIEW OF SYSTEMS:   A 10+ POINT REVIEW OF SYSTEMS WAS OBTAINED including neurology, dermatology, psychiatry, cardiac, respiratory, lymph, extremities, GI, GU, Musculoskeletal, constitutional, breasts, reproductive, HEENT.  All pertinent positives are noted in the HPI.  All others are negative.   PHYSICAL EXAMINATION: ECOG FS:1 - Symptomatic but completely ambulatory  Vitals:   12/20/19 1218  BP: (!) 150/69  Pulse: (!) 108  Resp: 14  Temp: 97.7 F (36.5 C)  SpO2: 98%   Wt Readings from Last 3 Encounters:  12/20/19 232 lb 9.6 oz (105.5 kg)  11/27/19 230 lb (104.3 kg)  11/01/19 231 lb (104.8 kg)   Body mass index is 36.43 kg/m.    Exam given in   [place], in no acute distress and comfortable SKIN: no acute rashes, no significant  lesions EYES: conjunctiva are pink and non-injected, sclera anicteric OROPHARYNX: MMM, no exudates, no oropharyngeal erythema or ulceration NECK: supple, no JVD LYMPH:  no palpable lymphadenopathy in the cervical, axillary or inguinal regions LUNGS: clear to auscultation b/l with normal respiratory effort HEART: regular rate & rhythm ABDOMEN:  normoactive bowel sounds , non tender, not distended. Extremity: no pedal edema PSYCH: alert & oriented x 3 with fluent speech NEURO: no focal motor/sensory deficits  LABORATORY DATA:  I have reviewed the data as listed  . CBC Latest Ref Rng & Units 12/20/2019 11/01/2019 10/01/2019  WBC 4.0 - 10.5 K/uL 5.1 4.0 6.0  Hemoglobin 12.0 - 15.0 g/dL 11.4(L) 11.1(L) 11.6(L)  Hematocrit 36 - 46 % 35.1(L) 33.9(L) 35.4(L)  Platelets 150 - 400 K/uL 293 242 228    . CMP Latest Ref Rng & Units 12/20/2019 11/01/2019 10/01/2019  Glucose 70 - 99 mg/dL 160(H) 127(H) 96  BUN 6 - 20 mg/dL 13 10 15   Creatinine 0.44 - 1.00 mg/dL 1.12(H) 1.35(H) 1.13(H)  Sodium 135 - 145 mmol/L 140 140 144  Potassium 3.5 - 5.1 mmol/L 3.7 3.7 3.6  Chloride 98 - 111 mmol/L 104 103 105  CO2 22 - 32 mmol/L 24 24 29   Calcium 8.9 - 10.3 mg/dL 9.3 9.2 8.5(L)  Total Protein 6.5 - 8.1 g/dL 7.5 7.4 7.7  Total Bilirubin 0.3 - 1.2 mg/dL 0.6 0.5 0.4  Alkaline Phos 38 - 126 U/L 149(H) 168(H) 180(H)  AST  15 - 41 U/L 61(H) 85(H) 150(H)  ALT 0 - 44 U/L 79(H) 100(H) 188(H)   Component     Latest Ref Rng & Units 03/08/2019 03/22/2019  FSH     mIU/mL 65.5   LH     mIU/mL 46.6   Estrogen     pg/mL 82   Progesterone     ng/mL <0.1   Estradiol     pg/mL  <5.0        RADIOGRAPHIC STUDIES: I have personally reviewed the radiological images as listed and agreed with the findings in the report. DG Lumbar Spine Complete W/Bend  Result Date: 11/28/2019 CLINICAL DATA:  Chronic low back pain.  No reported injury. EXAM: LUMBAR SPINE - COMPLETE WITH BENDING VIEWS COMPARISON:  08/25/2016 lumbar  spine radiographs. FINDINGS: This report assumes 5 non rib-bearing lumbar vertebrae. Lumbar vertebral body heights are preserved, with no fracture. Mild-to-moderate multilevel lumbar degenerative disc disease, most prominent at L5-S1. There is 3 mm anterolisthesis at L2-3 on the neutral view, which measures 3 mm with flexion and extension. There is 3 mm anterolisthesis at L3-4 on the neutral view, which measures 4 mm with flexion and 2 mm with extension. There is 3 mm retrolisthesis at L4-5, which measures 3 mm with flexion and extension. Mild bilateral lumbar facet arthropathy, most prominent in the mid lumbar spine. No aggressive appearing focal osseous lesions. IMPRESSION: 1. Mild-to-moderate multilevel lumbar degenerative disc disease, most prominent at L5-S1. 2. Mild multilevel lumbar facet arthropathy. 3. Mild multilevel lumbar spondylolisthesis as detailed, with slight dynamic instability at L3-4. Electronically Signed   By: Ilona Sorrel M.D.   On: 11/28/2019 16:50   DG Si Joints  Result Date: 11/28/2019 CLINICAL DATA:  Chronic low back and bilateral hip pain. No reported injury. EXAM: BILATERAL SACROILIAC JOINTS - 3+ VIEW COMPARISON:  None. FINDINGS: The sacroiliac joint spaces are maintained and there is no evidence of arthropathy. Mild spondylosis noted in the visualized lower lumbar spine. No suspicious focal osseous lesions. IMPRESSION: No evidence of sacroiliac arthropathy. Mild spondylosis in the visualized lower lumbar spine. Electronically Signed   By: Ilona Sorrel M.D.   On: 11/28/2019 16:26   DG HIP UNILAT W OR W/O PELVIS 2-3 VIEWS LEFT  Result Date: 11/28/2019 CLINICAL DATA:  Chronic low back and bilateral hip pain. No reported injury. EXAM: DG HIP (WITH OR WITHOUT PELVIS) 2-3V LEFT COMPARISON:  None. FINDINGS: No left hip fracture or dislocation. No suspicious focal osseous lesions. No significant left hip arthropathy. No radiopaque foreign bodies. IMPRESSION: No significant left hip  arthropathy.  No acute osseous abnormality. Electronically Signed   By: Ilona Sorrel M.D.   On: 11/28/2019 16:24   DG HIP UNILAT W OR W/O PELVIS 2-3 VIEWS RIGHT  Result Date: 11/28/2019 CLINICAL DATA:  Chronic low back and right hip pain. No reported injury. EXAM: DG HIP (WITH OR WITHOUT PELVIS) 2-3V RIGHT COMPARISON:  None. FINDINGS: No pelvic fracture or diastasis. No right hip fracture or dislocation. No suspicious focal osseous lesions. Tiny marginal osteophytes at the inferior right humeral head and superolateral acetabular margin. Mild degenerative changes in the visualized lower lumbar spine. IMPRESSION: Minimal right hip osteoarthritis. Electronically Signed   By: Ilona Sorrel M.D.   On: 11/28/2019 16:23    ASSESSMENT & PLAN:   Brandy [name] is a 54 y.o. female with:  1. Metastatic breast cancer ER+/PRneg/Her2 neg  02/28/2019 neck CT with results revealing "negative for mass or adenopathy in the neck."  03/04/2019 head  MRI with results revealing "Negative for metastatic disease.  No acute abnormality in the brain."  2. Likely Pulmonary metastases  02/18/2019 chest and abdomen with results revealing "5cm spiculated soft tissue mass in inferior right breast, highly suspicious for primary breast carcinoma. No acute findings or metastatic disease within the abdomen or pelvis. Multiple small pulmonary nodules in both lung bases, consistent with pulmonary metastases. 4.5 cm uterine fibroid."  02/28/2019 C/A/P CT with results revealing "Irregular solid 5.0 cm right breast mass, suspicious for primary right breast malignancy. Innumerable solid pulmonary nodules scattered throughout both lungs, compatible with pulmonary metastases. No evidence of metastatic disease in the abdomen, pelvis or skeleton. Mildly enlarged and probably myomatous uterus. Simple 1.4 cm left adnexal cyst requires no follow-up. This recommendation follows ACR consensus guidelines: White Paper of the ACR Incidental  Findings Committee II on Adnexal Findings. J Am Coll Radiol [phone]. Aortic Atherosclerosis (ICD10-I70.0)."  NUCLEAR MEDICINE WHOLE BODY BONE SCAN completed on 03/19/2019 with results revealing "1. No scintigraphic evidence skeletal metastasis. 2. Degenerative bone disease in the posterior elements of the upper and mid lumbar spine."   04/09/2019 Bone Density ([id number]) which revealed "The BMD measured at Femur Neck Right is 1.153 g/cm2 with a T-score of 0.8. This patient is considered normal according to [facility]) criteria. Lumbar spine was not utilized due to advanced degenerative changes. The scan quality is good. Femur Neck Right 04/09/2019 54.7 Normal 0.8 1.153 g/cm2. Left Forearm Radius 33% 04/09/2019 54.7 Normal 0.8 0.949 g/cm2."  08/04/2019 CT Angio Chest ([id number]) revealed "1. No lobar or central pulmonary embolus detected. Exam is limited secondary to respiratory motion. 2. Mild ground-glass attenuation may represent mild pneumonitis or areas of air trapping. 3. Signs of atrial septal closure. 4. Decrease in size and number of bilateral pulmonary nodules, marked response noted on today's exam with the only nodule remaining near a cm in the right upper lobe and the smaller nodules that were present on the previous examination throughout the chest no longer measurable though the lower lobes are limited by respiratory motion. 5. Decreased size of right breast mass. 6. Probable hepatic steatosis."  3. Abnormal Liver function test -- previous? Fatty liver 07/03/2019 [place] Abd ([id number]) revealed "1. No acute findings. Normal gallbladder. No bile duct dilation. 2. Significant increased liver parenchymal echogenicity consistent with extensive hepatic steatosis."  PLAN: -Discussed pt labwork today, 12/20/19; of CBC w/diff and CMP is as follows: all values are WNL except for RBC at 3.43, Hemoglobin at 11.4, HCT at 35.1, MCV at 102.3, Glucose at 160, Creatinine at 1.12,  AST at 61, ALT at 79, Alkaline Phosphatase at 149, GFR, Est Non Af Am at 55 -Discussed 12/18/19 of PFT -Advised pt that Verzenio can cause dry cough and lung inflammation in a small population. Would like to determine if cough is medication related or due to lung disease.  -Advised pt to continue to hold Verzenio at this time -Advised continue to take Letrozole -Advised on boil on her right side - OTC topical antibiotics -Advised on pt falling- Recommends seeing outpatient Cancer rehab program  -Recommend pt take OTC probiotics to improve loose stools  -Recommends f/u with Pulmonology for COPD evaluation as scheduled (sooner if possible) -Recommends simplifying social scene as much as possible  -Recommends f/u with PCP -Recommend pt f/u as scheduled with [facility] -Will see back in 2 months with labs  FOLLOW UP: Referral to cancer rehab center RTC with Dr Irene Limbo with labs in 2 months  The total time spent in the appt was 35 minutes and more than 50% was on counseling and direct patient cares.  All of the patient's questions were answered with apparent satisfaction. The patient knows to call the clinic with any problems, questions or concerns.  Sullivan Lone MD MS AAHIVMS [facility] Hematology/Oncology Physician [facility]  (Office):       [phone] (Work cell):  [phone] (Fax):           [phone]  12/20/2019 1:43 PM  I, Dawayne Cirri am acting as a Education administrator for Dr. Sullivan Lone.   .I have reviewed the above documentation for accuracy and completeness, and I agree with the above. Brunetta Genera MD

## 2019-12-20 ENCOUNTER — Other Ambulatory Visit: Payer: Self-pay

## 2019-12-20 ENCOUNTER — Ambulatory Visit: Payer: BC Managed Care – PPO | Admitting: Hematology

## 2019-12-20 ENCOUNTER — Inpatient Hospital Stay: Payer: BLUE CROSS/BLUE SHIELD | Attending: Hematology

## 2019-12-20 ENCOUNTER — Telehealth: Payer: Self-pay | Admitting: Hematology

## 2019-12-20 ENCOUNTER — Inpatient Hospital Stay (HOSPITAL_BASED_OUTPATIENT_CLINIC_OR_DEPARTMENT_OTHER): Payer: BLUE CROSS/BLUE SHIELD | Admitting: Hematology

## 2019-12-20 ENCOUNTER — Other Ambulatory Visit: Payer: BC Managed Care – PPO

## 2019-12-20 VITALS — BP 150/69 | HR 108 | Temp 97.7°F | Resp 14 | Wt 232.6 lb

## 2019-12-20 DIAGNOSIS — Z8249 Family history of ischemic heart disease and other diseases of the circulatory system: Secondary | ICD-10-CM | POA: Insufficient documentation

## 2019-12-20 DIAGNOSIS — C50911 Malignant neoplasm of unspecified site of right female breast: Secondary | ICD-10-CM | POA: Insufficient documentation

## 2019-12-20 DIAGNOSIS — F17210 Nicotine dependence, cigarettes, uncomplicated: Secondary | ICD-10-CM | POA: Diagnosis not present

## 2019-12-20 DIAGNOSIS — Z79899 Other long term (current) drug therapy: Secondary | ICD-10-CM | POA: Diagnosis not present

## 2019-12-20 DIAGNOSIS — C7802 Secondary malignant neoplasm of left lung: Secondary | ICD-10-CM | POA: Diagnosis present

## 2019-12-20 DIAGNOSIS — R7989 Other specified abnormal findings of blood chemistry: Secondary | ICD-10-CM

## 2019-12-20 DIAGNOSIS — C7801 Secondary malignant neoplasm of right lung: Secondary | ICD-10-CM | POA: Insufficient documentation

## 2019-12-20 DIAGNOSIS — M25551 Pain in right hip: Secondary | ICD-10-CM | POA: Insufficient documentation

## 2019-12-20 DIAGNOSIS — C50919 Malignant neoplasm of unspecified site of unspecified female breast: Secondary | ICD-10-CM

## 2019-12-20 DIAGNOSIS — Z801 Family history of malignant neoplasm of trachea, bronchus and lung: Secondary | ICD-10-CM | POA: Insufficient documentation

## 2019-12-20 DIAGNOSIS — Z170 Estrogen receptor positive status [ER+]: Secondary | ICD-10-CM | POA: Diagnosis not present

## 2019-12-20 DIAGNOSIS — R5383 Other fatigue: Secondary | ICD-10-CM | POA: Insufficient documentation

## 2019-12-20 DIAGNOSIS — M25552 Pain in left hip: Secondary | ICD-10-CM | POA: Insufficient documentation

## 2019-12-20 DIAGNOSIS — G8929 Other chronic pain: Secondary | ICD-10-CM | POA: Insufficient documentation

## 2019-12-20 DIAGNOSIS — M1611 Unilateral primary osteoarthritis, right hip: Secondary | ICD-10-CM | POA: Diagnosis not present

## 2019-12-20 DIAGNOSIS — R945 Abnormal results of liver function studies: Secondary | ICD-10-CM

## 2019-12-20 DIAGNOSIS — M545 Low back pain: Secondary | ICD-10-CM | POA: Diagnosis not present

## 2019-12-20 DIAGNOSIS — Z833 Family history of diabetes mellitus: Secondary | ICD-10-CM | POA: Diagnosis not present

## 2019-12-20 LAB — CBC WITH DIFFERENTIAL (CANCER CENTER ONLY)
Abs Immature Granulocytes: 0.02 10*3/uL (ref 0.00–0.07)
Basophils Absolute: 0 10*3/uL (ref 0.0–0.1)
Basophils Relative: 1 %
Eosinophils Absolute: 0.1 10*3/uL (ref 0.0–0.5)
Eosinophils Relative: 2 %
HCT: 35.1 % — ABNORMAL LOW (ref 36.0–46.0)
Hemoglobin: 11.4 g/dL — ABNORMAL LOW (ref 12.0–15.0)
Immature Granulocytes: 0 %
Lymphocytes Relative: 42 %
Lymphs Abs: 2.1 10*3/uL (ref 0.7–4.0)
MCH: 33.2 pg (ref 26.0–34.0)
MCHC: 32.5 g/dL (ref 30.0–36.0)
MCV: 102.3 fL — ABNORMAL HIGH (ref 80.0–100.0)
Monocytes Absolute: 0.3 10*3/uL (ref 0.1–1.0)
Monocytes Relative: 7 %
Neutro Abs: 2.5 10*3/uL (ref 1.7–7.7)
Neutrophils Relative %: 48 %
Platelet Count: 293 10*3/uL (ref 150–400)
RBC: 3.43 MIL/uL — ABNORMAL LOW (ref 3.87–5.11)
RDW: 14.7 % (ref 11.5–15.5)
WBC Count: 5.1 10*3/uL (ref 4.0–10.5)
nRBC: 0 % (ref 0.0–0.2)

## 2019-12-20 LAB — CMP (CANCER CENTER ONLY)
ALT: 79 U/L — ABNORMAL HIGH (ref 0–44)
AST: 61 U/L — ABNORMAL HIGH (ref 15–41)
Albumin: 3.5 g/dL (ref 3.5–5.0)
Alkaline Phosphatase: 149 U/L — ABNORMAL HIGH (ref 38–126)
Anion gap: 12 (ref 5–15)
BUN: 13 mg/dL (ref 6–20)
CO2: 24 mmol/L (ref 22–32)
Calcium: 9.3 mg/dL (ref 8.9–10.3)
Chloride: 104 mmol/L (ref 98–111)
Creatinine: 1.12 mg/dL — ABNORMAL HIGH (ref 0.44–1.00)
GFR, Est AFR Am: >60 mL/min (ref >60)
GFR, Estimated: 55 mL/min — ABNORMAL LOW (ref >60)
Glucose, Bld: 160 mg/dL — ABNORMAL HIGH (ref 70–99)
Potassium: 3.7 mmol/L (ref 3.5–5.1)
Sodium: 140 mmol/L (ref 135–145)
Total Bilirubin: 0.6 mg/dL (ref 0.3–1.2)
Total Protein: 7.5 g/dL (ref 6.5–8.1)

## 2019-12-20 NOTE — Telephone Encounter (Signed)
Scheduled per los. Gave avs and calendar  

## 2019-12-24 NOTE — Patient Instructions (Addendum)
____________________________________________________________________________________________  Preparing for Procedure with Sedation  Procedure appointments are limited to planned procedures: . No Prescription Refills. . No disability issues will be discussed. . No medication changes will be discussed.  Instructions: . Oral Intake: Do not eat or drink anything for at least 8 hours prior to your procedure. (Exception: Blood Pressure Medication. See below.) . Transportation: Unless otherwise stated by your physician, you may drive yourself after the procedure. . Blood Pressure Medicine: Do not forget to take your blood pressure medicine with a sip of water the morning of the procedure. If your Diastolic (lower reading)is above 100 mmHg, elective cases will be cancelled/rescheduled. . Blood thinners: These will need to be stopped for procedures. Notify our staff if you are taking any blood thinners. Depending on which one you take, there will be specific instructions on how and when to stop it. . Diabetics on insulin: Notify the staff so that you can be scheduled 1st case in the morning. If your diabetes requires high dose insulin, take only  of your normal insulin dose the morning of the procedure and notify the staff that you have done so. . Preventing infections: Shower with an antibacterial soap the morning of your procedure. . Build-up your immune system: Take 1000 mg of Vitamin C with every meal (3 times a day) the day prior to your procedure. . Antibiotics: Inform the staff if you have a condition or reason that requires you to take antibiotics before dental procedures. . Pregnancy: If you are pregnant, call and cancel the procedure. . Sickness: If you have a cold, fever, or any active infections, call and cancel the procedure. . Arrival: You must be in the facility at least 30 minutes prior to your scheduled procedure. . Children: Do not bring children with you. . Dress appropriately:  Bring dark clothing that you would not mind if they get stained. . Valuables: Do not bring any jewelry or valuables.  Reasons to call and reschedule or cancel your procedure: (Following these recommendations will minimize the risk of a serious complication.) . Surgeries: Avoid having procedures within 2 weeks of any surgery. (Avoid for 2 weeks before or after any surgery). . Flu Shots: Avoid having procedures within 2 weeks of a flu shots or . (Avoid for 2 weeks before or after immunizations). . Barium: Avoid having a procedure within 7-10 days after having had a radiological study involving the use of radiological contrast. (Myelograms, Barium swallow or enema study). . Heart attacks: Avoid any elective procedures or surgeries for the initial 6 months after a "Myocardial Infarction" (Heart Attack). . Blood thinners: It is imperative that you stop these medications before procedures. Let us know if you if you take any blood thinner.  . Infection: Avoid procedures during or within two weeks of an infection (including chest colds or gastrointestinal problems). Symptoms associated with infections include: Localized redness, fever, chills, night sweats or profuse sweating, burning sensation when voiding, cough, congestion, stuffiness, runny nose, sore throat, diarrhea, nausea, vomiting, cold or Flu symptoms, recent or current infections. It is specially important if the infection is over the area that we intend to treat. . Heart and lung problems: Symptoms that may suggest an active cardiopulmonary problem include: cough, chest pain, breathing difficulties or shortness of breath, dizziness, ankle swelling, uncontrolled high or unusually low blood pressure, and/or palpitations. If you are experiencing any of these symptoms, cancel your procedure and contact your primary care physician for an evaluation.  Remember:  Regular Business hours are:    Monday to Thursday 8:00 AM to 4:00 PM  Provider's  Schedule: Milinda Pointer, MD:  Procedure days: Tuesday and Thursday 7:30 AM to 4:00 PM  Gillis Santa, MD:  Procedure days: Monday and Wednesday 7:30 AM to 4:00 PM ____________________________________________________________________________________________   Pain Management Discharge Instructions  General Discharge Instructions :  If you need to reach your doctor call: Monday-Friday 8:00 am - 4:00 pm at [phone] or toll free [phone].  After clinic hours [phone] to have operator reach doctor.  Bring all of your medication bottles to all your appointments in the pain clinic.  To cancel or reschedule your appointment with Pain Management please remember to call 24 hours in advance to avoid a fee.  Refer to the educational materials which you have been given on: General Risks, I had my Procedure. Discharge Instructions, Post Sedation.  Post Procedure Instructions:  The drugs you were given will stay in your system until tomorrow, so for the next 24 hours you should not drive, make any legal decisions or drink any alcoholic beverages.  You may eat anything you prefer, but it is better to start with liquids then soups and crackers, and gradually work up to solid foods.  Please notify your doctor immediately if you have any unusual bleeding, trouble breathing or pain that is not related to your normal pain.  Depending on the type of procedure that was done, some parts of your body may feel week and/or numb.  This usually clears up by tonight or the next day.  Walk with the use of an assistive device or accompanied by an adult for the 24 hours.  You may use ice on the affected area for the first 24 hours.  Put ice in a Ziploc bag and cover with a towel and place against area 15 minutes on 15 minutes off.  You may switch to heat after 24 hours.Facet Blocks Patient Information  Description: The facets are joints in the spine between the vertebrae.  Like any joints in the  body, facets can become irritated and painful.  Arthritis can also effect the facets.  By injecting steroids and local anesthetic in and around these joints, we can temporarily block the nerve supply to them.  Steroids act directly on irritated nerves and tissues to reduce selling and inflammation which often leads to decreased pain.  Facet blocks may be done anywhere along the spine from the neck to the low back depending upon the location of your pain.   After numbing the skin with local anesthetic (like Novocaine), a small needle is passed onto the facet joints under x-ray guidance.  You may experience a sensation of pressure while this is being done.  The entire block usually lasts about 15-25 minutes.   Conditions which may be treated by facet blocks:   Low back/buttock pain  Neck/shoulder pain  Certain types of headaches  Preparation for the injection:  1. Do not eat any solid food or dairy products within 8 hours of your appointment. 2. You may drink clear liquid up to 3 hours before appointment.  Clear liquids include water, black coffee, juice or soda.  No milk or cream please. 3. You may take your regular medication, including pain medications, with a sip of water before your appointment.  Diabetics should hold regular insulin (if taken separately) and take 1/2 normal NPH dose the morning of the procedure.  Carry some sugar containing items with you to your appointment. 4. A driver must accompany you and be prepared  to drive you home after your procedure. 5. Bring all your current medications with you. 6. An IV may be inserted and sedation may be given at the discretion of the physician. 7. A blood pressure cuff, EKG and other monitors will often be applied during the procedure.  Some patients may need to have extra oxygen administered for a short period. 8. You will be asked to provide medical information, including your allergies and medications, prior to the procedure.  We must know  immediately if you are taking blood thinners (like Coumadin/Warfarin) or if you are allergic to IV iodine contrast (dye).  We must know if you could possible be pregnant.  Possible side-effects:   Bleeding from needle site  Infection (rare, may require surgery)  Nerve injury (rare)  Numbness & tingling (temporary)  Difficulty urinating (rare, temporary)  Spinal headache (a headache worse with upright posture)  Light-headedness (temporary)  Pain at injection site (serveral days)  Decreased blood pressure (rare, temporary)  Weakness in arm/leg (temporary)  Pressure sensation in back/neck (temporary)   Call if you experience:   Fever/chills associated with headache or increased back/neck pain  Headache worsened by an upright position  New onset, weakness or numbness of an extremity below the injection site  Hives or difficulty breathing (go to the emergency room)  Inflammation or drainage at the injection site(s)  Severe back/neck pain greater than usual  New symptoms which are concerning to you  Please note:  Although the local anesthetic injected can often make your back or neck feel good for several hours after the injection, the pain will likely return. It takes 3-7 days for steroids to work.  You may not notice any pain relief for at least one week.  If effective, we will often do a series of 2-3 injections spaced 3-6 weeks apart to maximally decrease your pain.  After the initial series, you may be a candidate for a more permanent nerve block of the facets.  If you have any questions, please call #[phone]) [facility]

## 2019-12-24 NOTE — Progress Notes (Signed)
PROVIDER NOTE: Information contained herein reflects review and annotations entered in association with encounter. Interpretation of such information and data should be left to medically-trained personnel. Information provided to patient can be located elsewhere in the medical record under "Patient Instructions". Document created using STT-dictation technology, any transcriptional errors that may result from process are unintentional.    Patient: Brandy [name] [facility]  Service Category: E/M  Provider: Gaspar Cola, MD  DOB: [birth year]  DOS: 12/25/2019  Specialty: Interventional Pain Management  MRN: [id number]  Setting: Ambulatory outpatient  [facility]P: [facility]  Type: Established Patient    Referring Provider: [facility]  Location: Office  Delivery: Face-to-face     Primary Reason(s) for Visit: Encounter for evaluation before starting new chronic pain management plan of care (Level of risk: moderate) CC: Back Pain (lumbar bilateral right is worse), Hip Pain (right ), and Knee Pain (bilateral )  HPI  Brandy [name] is a 54 y.o. year old, female patient, who comes today for a follow-up evaluation to review the test results and decide on a treatment plan. 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, L5-S1); Acute pansinusitis; Anxiety disorder; Insomnia; Low back pain radiating to both legs; Migraine; Neuropathy; Pneumonitis; Breast cancer ([place]); Tachycardia; Diarrhea; GERD (gastroesophageal reflux disease); Hyperlipidemia; CKD (chronic kidney disease), stage III; Depression; Malignant hypertension; Transaminitis; Lactic acidosis; Hepatic steatosis; Sepsis ([place]); Nausea & vomiting; Obesity (BMI 30-39.9); Infiltrating ductal carcinoma of breast ([place]); Asthma; Bilateral leg weakness; Ocular proptosis; PVD (posterior vitreous detachment), left eye; Chorioretinal scar of left eye; Vitreous  floaters of left eye; Chronic pain syndrome; Pharmacologic therapy; Disorder of skeletal system; Problems influencing health status; Abnormal MRI, lumbar spine (08/22/2018); Chronic low back pain (1ry area of Pain) (Bilateral) w/ sciatica (Bilateral); DDD (degenerative disc disease), lumbosacral; Grade 1 Anterolisthesis (L2-3, L3-4); Grade 1 Retrolisthesis (L4-5, L5-S1); Lumbar facet hypertrophy (Multilevel) (Bilateral); Lumbar facet syndrome (Multilevel) (Bilateral) (R>L); Chronic lower extremity pain (2ry area of Pain) (Bilateral) (R>L); Neurogenic pain; Chronic musculoskeletal pain; Chronic hip pain (3ry area of Pain) (Bilateral) (R>L); and Chronic sacroiliac joint pain (Left) on their problem list. Her primarily concern today is the Back Pain (lumbar bilateral right is worse), Hip Pain (right ), and Knee Pain (bilateral )  Pain Assessment: Location: Lower, Left, Right Back (see visit info for additional pain sites.) Radiating: denies Onset: More than a month ago Duration: Chronic pain Quality: Constant, Discomfort, Burning, Stabbing Severity: 6 /10 (subjective, self-reported pain score)  Note: Reported level is compatible with observation.                         When using our objective Pain Scale, levels between 6 and 10/10 are said to belong in an emergency room, as it progressively worsens from a 6/10, described as severely limiting, requiring emergency care not usually available at an outpatient pain management facility. At a 6/10 level, communication becomes difficult and requires great effort. Assistance to reach the emergency department may be required. Facial flushing and profuse sweating along with potentially dangerous increases in heart rate and blood pressure will be evident. Effect on ADL: has to start and stop activities. Timing: Constant Modifying factors: nothing currently BP: 128/77  HR: 92  Brandy [name] comes in today for a follow-up visit after her initial evaluation on  11/27/2019. Today we went over the results of her tests. These were explained in "Layman's terms". During today's  appointment we went over my diagnostic impression, as well as the proposed treatment plan.  According to the patient the primary area of pain is that of the lower back, bilaterally. (R>L) she indicated having had 1 back surgery done approximately 4 years ago by Dr. Louanne Skye.  She also indicated having had some physical therapy for this back pain but it did not help her.  She denied any recent imaging of the back except for a possible x-ray done in 2020.  The patient indicated also having had some nerve blocks that were done at [facility] by a radiologist.  This was ordered by Estill Bamberg the [facility] for the orthopedic department.  The patient's secondary pain is that of the lower extremities, bilaterally, (R>L).  She denied any surgeries in the leg and does admit having had some physical therapy several years ago which did help with leg pain.  In the case of the right lower extremity the pain goes down to the area of the knee through the posterior and lateral aspect of the leg.  However she also indicated having intermittent pain below the knee that runs through the back of the leg.  In the case of the left lower extremity the pain also seems to run through the back of the leg, just below the calf area.  The patient denied any nerve conduction tests or nerve blocks in the lower extremities.  The third area pain is that of the hips, bilaterally, (R>L).  The patient denied any surgeries, nerve blocks, physical therapy, or x-rays of that area.  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  She is being treated at [facility] oncology and she is undergoing chemotherapy for this.  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  Physical exam is positive for  provocative maneuvers of the lumbar spine with exact reproduction of the patient's low back pain on hyperextension on rotation maneuvers.  The patient also has a patch on her right eye since she had it removed on March 2020 secondary to being a painful eye. The patient ambulates using a cane.  In considering the treatment plan options, Brandy [name] was reminded that I no longer take patients for medication management only. I asked her to let me know if she had no intention of taking advantage of the interventional therapies, so that we could make arrangements to provide this space to someone interested. I also made it clear that undergoing interventional therapies for the purpose of getting pain medications is very inappropriate on the part of a patient, and it will not be tolerated in this practice. This type of behavior would suggest true addiction and therefore it requires referral to an addiction specialist.   Further details on both, my assessment(s), as well as the proposed treatment plan, please see below.  Controlled Substance Pharmacotherapy Assessment REMS (Risk Evaluation and Mitigation Strategy)  Analgesic: None Highest recorded MME/day: 45 mg/day MME/day: 0 mg/day  Pill Count: None expected due to no prior prescriptions written by our practice. No notes on file Pharmacokinetics: Liberation and absorption (onset of action): WNL Distribution (time to peak effect): WNL Metabolism and excretion (duration of action): WNL         Pharmacodynamics: Desired effects: Analgesia: Brandy [name] reports >50% benefit. Functional ability: Patient reports that medication allows her to accomplish basic ADLs Clinically meaningful improvement in function (CMIF): Sustained CMIF  goals met Perceived effectiveness: Described as relatively effective, allowing for increase in activities of daily living (ADL) Undesirable effects: Side-effects or Adverse reactions: None reported Monitoring: [place] PMP:  PDMP reviewed during this encounter. Online review of the past 72-monthperiod previously conducted. Not applicable at this point since we have not taken over the patient's medication management yet. List of other Serum/Urine Drug Screening Test(s):  Lab Results  Component Value Date   COCAINSCRNUR NONE DETECTED 09/04/2019   THCU NONE DETECTED 09/04/2019   List of all UDS test(s) done:  Lab Results  Component Value Date   SUMMARY Note 11/27/2019   Last UDS on record: Summary  Date Value Ref Range Status  11/27/2019 Note  Final    Comment:    ==================================================================== Compliance Drug Analysis, Ur ==================================================================== Test                             Result       Flag       Units  Drug Present and Declared for Prescription Verification   Alprazolam                     18           EXPECTED   ng/mg creat   Alpha-hydroxyalprazolam        80           EXPECTED   ng/mg creat    Source of alprazolam is a scheduled prescription medication. Alpha-    hydroxyalprazolam is an expected metabolite of alprazolam.    Hydrocodone                    636          EXPECTED   ng/mg creat   Hydromorphone                  264          EXPECTED   ng/mg creat   Dihydrocodeine                 135          EXPECTED   ng/mg creat   Norhydrocodone                 416          EXPECTED   ng/mg creat    Sources of hydrocodone include scheduled prescription medications.    Hydromorphone, dihydrocodeine and norhydrocodone are expected    metabolites of hydrocodone. Hydromorphone and dihydrocodeine are    also available as scheduled prescription medications.    Amitriptyline                  PRESENT      EXPECTED   Nortriptyline                  PRESENT      EXPECTED    Nortriptyline is an expected metabolite of amitriptyline.    Acetaminophen                  PRESENT      EXPECTED   Guaifenesin                     PRESENT      EXPECTED    Guaifenesin may be administered as an over-the-counter or    prescription drug; it may also be present as a  breakdown product of    methocarbamol.  Drug Absent but Declared for Prescription Verification   Paroxetine                     Not Detected UNEXPECTED   Dextromethorphan               Not Detected UNEXPECTED ==================================================================== Test                      Result    Flag   Units      Ref Range   Creatinine              197              mg/dL      >=20 ==================================================================== Declared Medications:  The flagging and interpretation on this report are based on the  following declared medications.  Unexpected results may arise from  inaccuracies in the declared medications.   **Note: The testing scope of this panel includes these medications:   Alprazolam  Amitriptyline  Dextromethorphan  Guaifenesin  Hydrocodone  Paroxetine   **Note: The testing scope of this panel does not include small to  moderate amounts of these reported medications:   Acetaminophen   **Note: The testing scope of this panel does not include the  following reported medications:   Abemaciclib (Verzenio)  Albuterol  Atorvastatin  Benzonatate (Tessalon)  Cyanocobalamin  Eye Drop  Fluticasone (Flovent HFA)  Iron  Letrozole  Magnesium  Omeprazole  Ondansetron  Vitamin D2 ==================================================================== For clinical consultation, please call [phone]. ====================================================================    UDS interpretation: No unexpected findings.          Medication Assessment Form: Patient introduced to form today Treatment compliance: Treatment may start today if patient agrees with proposed plan. Evaluation of compliance is not applicable at this point Risk Assessment Profile: Aberrant behavior: See initial  evaluations. None observed or detected today Comorbid factors increasing risk of overdose: See initial evaluation. No additional risks detected today Opioid risk tool (ORT):  Opioid Risk  11/27/2019  Alcohol 0  Illegal Drugs 0  Rx Drugs 0  Alcohol 0  Illegal Drugs 0  Rx Drugs 0  Age between 16-45 years  0  History of Preadolescent Sexual Abuse 0  Psychological Disease 0  Depression 0  Opioid Risk Tool Scoring 0  Opioid Risk Interpretation Low Risk    ORT Scoring interpretation table:  Score <3 = Low Risk for SUD  Score between 4-7 = Moderate Risk for SUD  Score >8 = High Risk for Opioid Abuse   Risk of substance use disorder (SUD): Low  Pharmacologic Plan: No opioid analgesic prescribed.             Laboratory Chemistry Profile   Renal Lab Results  Component Value Date   BUN 13 12/20/2019   CREATININE 1.12 (H) 12/20/2019   GFRAA >60 12/20/2019   GFRNONAA 55 (L) 12/20/2019   PROTEINUR NEGATIVE 09/04/2019     Electrolytes Lab Results  Component Value Date   NA 140 12/20/2019   K 3.7 12/20/2019   CL 104 12/20/2019   CALCIUM 9.3 12/20/2019   MG 1.5 (L) 11/01/2019     Hepatic Lab Results  Component Value Date   AST 61 (H) 12/20/2019   ALT 79 (H) 12/20/2019   ALBUMIN 3.5 12/20/2019   ALKPHOS 149 (H) 12/20/2019   LIPASE 36 09/05/2019     ID Lab Results  Component Value Date  HIV NON REACTIVE 09/05/2019   SARSCOV2NAA NEGATIVE 12/16/2019   STAPHAUREUS NEGATIVE 05/20/2016   MRSAPCR NEGATIVE 05/20/2016     Bone Lab Results  Component Value Date   VD25OH 30.51 06/26/2019   25OHVITD1 50 11/27/2019   25OHVITD2 46 11/27/2019   25OHVITD3 4.1 11/27/2019     Endocrine Lab Results  Component Value Date   GLUCOSE 160 (H) 12/20/2019   GLUCOSEU NEGATIVE 09/04/2019   TSH 2.083 09/06/2019     Neuropathy Lab Results  Component Value Date   MBWGYKZL93 570 11/27/2019   HIV NON REACTIVE 09/05/2019     CNS No results found.   Inflammation (CRP: Acute   ESR: Chronic) Lab Results  Component Value Date   CRP 16 (H) 11/27/2019   ESRSEDRATE 22 11/27/2019   LATICACIDVEN 2.1 (HH) 11/01/2019     Rheumatology Lab Results  Component Value Date   RF <14 12/29/2016   ANA NEGATIVE 06/27/2017   LABURIC 6.5 12/29/2016     Coagulation Lab Results  Component Value Date   INR 1.0 11/01/2019   LABPROT 12.3 11/01/2019   APTT 33 05/20/2016   PLT 293 12/20/2019   DDIMER 1.11 (H) 08/04/2019     Cardiovascular Lab Results  Component Value Date   BNP 7.5 11/01/2019   CKTOTAL 77 06/04/2009   CKMB 1.1 06/04/2009   TROPONINI <0.03 02/22/2015   HGB 11.4 (L) 12/20/2019   HCT 35.1 (L) 12/20/2019     Screening Lab Results  Component Value Date   SARSCOV2NAA NEGATIVE 12/16/2019   STAPHAUREUS NEGATIVE 05/20/2016   MRSAPCR NEGATIVE 05/20/2016   HIV NON REACTIVE 09/05/2019     Cancer No results found.   Allergens No results found.     Note: Lab results reviewed and explained to patient in Layman's terms.  Recent Diagnostic Imaging Review  Shoulder Imaging: Shoulder-L MR wo contrast: Results for orders placed during the hospital encounter of 07/26/16 MR Shoulder Left w/o contrast  Narrative CLINICAL DATA:  Shoulder pain for 6 months. No known injury or prior relevant surgery.  EXAM: MRI OF THE LEFT SHOULDER WITHOUT CONTRAST  TECHNIQUE: Multiplanar, multisequence MR imaging of the shoulder was performed. No intravenous contrast was administered.  COMPARISON:  Radiographs 07/18/2016.  FINDINGS: Rotator cuff:  Intact without significant tendinosis.  Muscles: There is some T2 hyperintensity within the deltoid muscle posterolateral to the acromion. The patient denies shoulder injection, and this may reflect a muscular strain. There is no abnormal signal within the rotator cuff musculature.  Biceps long head:  Intact and normally positioned.  Acromioclavicular Joint: The acromion is type 1. There are mild acromioclavicular  degenerative changes. There is a small amount of fluid in the subacromial -subdeltoid bursa.  Glenohumeral Joint: No significant shoulder joint effusion or glenohumeral arthropathy.  Labrum:  No evidence of labral tear.  Bones: No acute or significant extra-articular osseous findings.  Other: No significant soft tissue findings.  IMPRESSION: 1. Probable posterior deltoid muscle strain with adjacent subacromial -subdeltoid bursitis. 2. The rotator cuff, labrum and biceps tendon appear normal.   Electronically Signed By: Richardean Sale M.D. On: 07/26/2016 16:23  Thoracic Imaging: Thoracic DG: Results for orders placed during the hospital encounter of 12/05/07 DG Thoracolumabar Spine  Narrative Clinical Data: Increased activity on bone scan to the left in the region of T9-10, back pain  THORACOLUMBAR SPINE - 2 VIEW  Comparison: Nuclear medicine bone scan of today  Findings: Only mild degenerative changes noted which may account for the faint increased activity in the  lower thoracic spine in the region of T9-10 on the left.  The no lytic or blastic lesion is seen and the pedicles appear normal.  IMPRESSION: The small focus of activity to the left at T9-10 on today's bone scan is most likely due to mild degenerative change.  No other bony abnormality is seen.  Provider: Pearla Dubonnet  Lumbosacral Imaging: Lumbar MR wo contrast: Results for orders placed during the hospital encounter of 08/22/18 MR LUMBAR SPINE WO CONTRAST  Narrative CLINICAL DATA:  54 year old female with bilateral low back pain, right greater than left. Prior surgery.  EXAM: MRI LUMBAR SPINE WITHOUT CONTRAST  TECHNIQUE: Multiplanar, multisequence MR imaging of the lumbar spine was performed. No intravenous contrast was administered.  COMPARISON:  Lumbar MRI 01/13/2017.  Lumbar radiographs 08/25/2016.  FINDINGS: Segmentation: Normal on the comparison radiographs which is the same numbering  system used on the 2018 MRI.  Alignment: Stable since 2018. Mild grade 1 anterolisthesis at L2-L3, and to a lesser extent L3-L4. Mild retrolisthesis at L4-L5 and L5-S1. Overall straightening of lumbar lordosis.  Vertebrae: Degenerative posterior element marrow signal changes throughout the lumbar spine with no definite marrow edema or evidence of acute osseous abnormality. Normal background bone marrow signal. Intact visible sacrum and SI joints.  Conus medullaris and cauda equina: Conus extends to the L1-L2 level. No lower spinal cord or conus signal abnormality.  Paraspinal and other soft tissues: Negative visible abdominal viscera. Mild postoperative soft tissue changes to the left of midline posteriorly at L4-L5. Stable visible uterine fundus.  Disc levels:  T11-T12: Chronic mild disc desiccation and disc bulge. Moderate facet hypertrophy. Mild left greater than right T11 foraminal stenosis.  T12-L1: Negative disc. Moderate facet hypertrophy is chronic. No stenosis.  L1-L2: Negative disc. Moderate facet and ligament flavum hypertrophy is chronic. No stenosis.  L2-L3: Mild anterolisthesis. Negative disc. Moderate to severe facet and ligament flavum hypertrophy. No associated stenosis.  L3-L4: Subtle anterolisthesis. A broad-based right foraminal and far lateral disc protrusion on series 8, image 17 appears increased since 2018. Chronic moderate to severe facet and ligament flavum hypertrophy with mildly progressed degenerative facet joint fluid. No spinal or lateral recess stenosis. Mild to moderate right L3 foraminal/extraforaminal stenosis appears increased.  L4-L5: Chronic postoperative changes to the lamina with moderate residual facet hypertrophy. Mild retrolisthesis and disc bulge with small chronic left subarticular component with annular fissure (series 5, image 10. Left lateral recess is stable without convincing stenosis. No spinal stenosis. Mild left L4  foraminal stenosis is stable.  L5-S1: Chronic postoperative changes to the posterior elements with moderate residual facet hypertrophy greater on the right. Chronic retrolisthesis and disc space loss with circumferential disc bulge and endplate spurring eccentric to the right. Broad-based posterior component of disc. No spinal stenosis. Mild to moderate right lateral recess stenosis is chronic but appears mildly progressed since 2018 (right S1 nerve level). No foraminal stenosis.  IMPRESSION: 1. Chronic multilevel mild lumbar spondylolisthesis with chronically advanced facet arthropathy throughout the lumbar spine. Chronic postoperative changes to the lamina at L4-L5 and L5-S1. 2. Since 2018: - a chronic right foraminal disc protrusion at L3-L4 appears mildly increased along with mild to moderate right L3 nerve level stenosis. - chronic multifactorial right lateral recess stenosis at L5-S1 appears mildly increased at the right S1 nerve level. 3. No lumbar spinal stenosis. Stable bilateral T11 and left L4 neural foraminal stenosis.   Electronically Signed By: Genevie Ann M.D. On: 08/22/2018 11:20  Results for orders placed during the hospital  encounter of 03/06/16 MR Lumbar Spine Wo Contrast  Narrative CLINICAL DATA:  54 year old female with progressive chronic lumbar back pain radiating to the hips and legs left greater than right. Patient denies recent injury. No prior surgery. Initial encounter.  EXAM: MRI LUMBAR SPINE WITHOUT CONTRAST  TECHNIQUE: Multiplanar, multisequence MR imaging of the lumbar spine was performed. No intravenous contrast was administered.  COMPARISON:  Lumbar radiographs 10/01/2012.  FINDINGS: Segmentation:  Normal as seen on the comparison radiographs.  Alignment: Chronic straightening of lordosis. Mild retrolisthesis of L4 on L5 and L5 on S1 is more apparent on these images mild anterolisthesis of L2 on L3 is new. But probably has  not significantly changed since 2014.  Vertebrae: There is some heterogeneity of the marrow signal in the S4 vertebral body. This could be sequelae of an acute or less likely chronic S4 sacral fracture. Otherwise no marrow edema or evidence of acute osseous abnormality.  Conus medullaris: Extends to the L1-L2 level and appears normal.  Paraspinal and other soft tissues: Negative.  Disc levels:  T11-T12: Mild disc desiccation and circumferential disc bulge. Mild facet hypertrophy. Mild T11 foraminal stenosis.  T12-L1:  Mild facet hypertrophy.  No stenosis.  L1-L2: Mild-to-moderate facet and ligament flavum hypertrophy. No stenosis.  L2-L3: Trace anterolisthesis. Severe facet and moderate to severe ligament flavum hypertrophy. Furthermore on the right there is a degenerative synovial cyst measuring 4 x 6 x 8 mm projecting anteriorly toward the right lateral recess. See series 3, image 4 and series 7, image 10). This also is in proximity to the exiting right L2 nerve. Negative disc. No spinal stenosis.  L3-L4: Right far lateral disc bulging. Moderate facet and ligament flavum hypertrophy. Mild right L3 foraminal stenosis.  L4-L5: Mild retrolisthesis. Left eccentric disc bulge and endplate spurring. Moderate facet and ligament flavum hypertrophy. Mild left lateral recess stenosis. Mild left L4 foraminal stenosis. No spinal stenosis.  L5-S1: Mild retrolisthesis with disc space loss and circumferential disc bulge with endplate spurring. Broad-based posterior component. Mild to moderate facet and ligament flavum hypertrophy. Mild bilateral lateral recess stenosis. Mild left L5 foraminal stenosis.  IMPRESSION: 1. Mild grade 1 anterolisthesis at L2-L3 which may be new since 2014. Associated facet arthropathy and a right side synovial cyst (6-8 mm) abutting the course of the descending right L3 and exiting right L2 nerves. 2. Moderate lumbar facet degeneration elsewhere. Mild  lower lumbar disc and endplate degeneration. No lumbar spinal stenosis but there is multifactorial mild right foraminal stenosis at L3-L4, mild left lateral recess and left foraminal stenosis at L4-L5 and mild bilateral lateral recess stenosis and left foraminal stenosis at L5-S1. 3. Abnormal marrow signal in the S4 sacral vertebra resembling an acute lower sacral fracture. Is there tail bone pain?   Electronically Signed By: Genevie Ann M.D. On: 03/06/2016 09:49  Lumbar MR w/wo contrast: Results for orders placed during the hospital encounter of 01/13/17 MR Lumbar Spine W Wo Contrast  Narrative CLINICAL DATA:  Intermittent back pain for 5 years, bilateral lower extremity burning and numbness. Follow-up radiculopathy.  EXAM: MRI LUMBAR SPINE WITHOUT AND WITH CONTRAST  TECHNIQUE: Multiplanar and multiecho pulse sequences of the lumbar spine were obtained without and with intravenous contrast.  CONTRAST:  22m MULTIHANCE GADOBENATE DIMEGLUMINE 529 MG/ML IV SOLN  COMPARISON:  MRI of the lumbar spine March 06, 2016 and lumbar spine radiographs August 25, 2016  FINDINGS: SEGMENTATION: For the purposes of this report, the last well-formed intervertebral disc will be reported as L5-S1.  ALIGNMENT:  Straightened lumbar lordosis. Minimal grade 1 L2-3 anterolisthesis. Minimal grade 1 L4-5 retrolisthesis. Grade 1 L5-S1 retrolisthesis.  VERTEBRAE:Vertebral bodies are intact. Moderate to severe L5-S1 disc height loss, progressed from prior MRI. Mild desiccation lower lumbar discs. Mild chronic discogenic endplate changes T0-W4. Similar low T1, bright STIR signal single segment upper coccyx, without definite enhancement though, not tailored for evaluation. No abnormal or acute lumbar spine bone marrow signal or enhancement. No abnormal disc enhancement.  CONUS MEDULLARIS: Conus medullaris terminates at L1-2 and demonstrates normal morphology and signal characteristics. Cauda equina is  normal. No abnormal cord, leptomeningeal or epidural enhancement.  PARASPINAL AND SOFT TISSUES: Included prevertebral and paraspinal soft tissues are nonacute. Moderate LEFT sacral paraspinal denervation/atrophy.  DISC LEVELS:  T12-L1, L1-2: No disc bulge, canal stenosis nor neural foraminal narrowing. Mild to moderate facet arthropathy.  L2-3: Anterolisthesis. Unroofing of the disc without disc bulge. 4 mm RIGHT facet synovial cyst decreased from 9 mm, decreased fluid component. Moderate to severe RIGHT, moderate LEFT facet arthropathy and ligamentum flavum redundancy without canal stenosis or neural foraminal narrowing.  L3-4: Small broad-based RIGHT extraforaminal disc protrusion unchanged. Moderate to severe facet arthropathy without canal stenosis or neural foraminal narrowing.  L4-5: Retrolisthesis. LEFT hemilaminectomy. Homogeneous enhancing granulation tissue within the surgical bed, no fluid collection. Small LEFT subarticular disc protrusion and annular fissure persists. No canal stenosis. Mild LEFT neural foraminal narrowing.  L5-S1: Bilateral L5-S1 laminectomies. Homogeneous enhancing granulation tissue within the surgical bed, no focal fluid collection. Mild facet arthropathy. No canal stenosis. Mild LEFT neural foraminal narrowing.  IMPRESSION: 1. Status post LEFT L4-5 and bilateral L5-S1 laminectomies. 2. Grade 1 L2-3 anterolisthesis, grade 1 L4-5 retrolisthesis, grade 1 L5-S1 retrolisthesis. 3. No canal stenosis. Mild LEFT L4-5 and L5-S1 neural foraminal narrowing. 4. Similar abnormal upper coccyx bone marrow signal, nonspecific and incompletely characterized.   Electronically Signed By: Elon Alas M.D. On: 01/14/2017 00:23  Lumbar DG 1V: Results for orders placed during the hospital encounter of 05/23/16 DG Lumbar Spine 1 View  Narrative CLINICAL DATA:  Left L4-5 decompression.  EXAM: LUMBAR SPINE - 1 VIEW  COMPARISON:  05/23/2016  intraoperative lumbar spine radiographs .  FINDINGS: There is a posterior approach surgical marking device with the tip overlying the spinal canal at the level of the L5-S1 disc. Additional surgical instruments overlie the posterior lower back soft tissues at the L4-S1 levels.  IMPRESSION: Posterior approach surgical marking device position as described.   Electronically Signed By: Ilona Sorrel M.D. On: 05/23/2016 10:57  Lumbar DG 2-3 views: Results for orders placed during the hospital encounter of 05/23/16 DG Lumbar Spine 2-3 Views  Narrative CLINICAL DATA:  Intraoperative localization for L5-S1 decompression.  EXAM: LUMBAR SPINE - 2-3 VIEW  COMPARISON:  Lumbar spine MRI on 03/06/2016  FINDINGS: Intraoperative crosstable lateral 1 shows a probe overlying the posterior interspinous space at L4-5.  Intraoperative cross-table lateral 2 shows posterior retractors with a probe overlying the spinal canal at the level L4-5.  IMPRESSION: Intraoperative localization of L4-5.  These results were called by telephone at the time of interpretation on 05/23/2016 at 10:38 am to the [place], who verbally acknowledged these results and confirmed that Dr. Louanne Skye was aware these results.   Electronically Signed By: Earle Gell M.D. On: 05/23/2016 10:39        Lumbar DG (Complete) 4+V: Results for orders placed in visit on 10/01/12 DG Lumbar Spine Complete  Narrative *RADIOLOGY REPORT*  Clinical Data: 54 year old female with low back pain.  LUMBAR SPINE - COMPLETE 4+ VIEW  Comparison: Nuclear medicine whole body bone scan 12/05/2007. Thoracic radiographs 12/05/2007.  Findings: Normal lumbar segmentation.  Mild retrolisthesis of L5 on S1.  Mild to moderate disc space loss at that level and endplate spurring.  Trace retrolisthesis of L4 on L5.  Otherwise normal lumbar vertebral height and alignment.  The other disc spaces are relatively preserved.  No pars fracture.   Grossly intact lower thoracic levels.  Sacral ala and SI joints within normal limits.  IMPRESSION: Chronic disc and endplate degeneration at L5-S1 with mild retrolisthesis. No acute osseous abnormality in the lumbar spine.   Original Report Authenticated By: Erskine Speed, M.D.  Lumbar DG Bending views: Results for orders placed during the hospital encounter of 11/27/19 DG Lumbar Spine Complete W/Bend  Narrative CLINICAL DATA:  Chronic low back pain.  No reported injury.  EXAM: LUMBAR SPINE - COMPLETE WITH BENDING VIEWS  COMPARISON:  08/25/2016 lumbar spine radiographs.  FINDINGS: This report assumes 5 non rib-bearing lumbar vertebrae.  Lumbar vertebral body heights are preserved, with no fracture.  Mild-to-moderate multilevel lumbar degenerative disc disease, most prominent at L5-S1. There is 3 mm anterolisthesis at L2-3 on the neutral view, which measures 3 mm with flexion and extension. There is 3 mm anterolisthesis at L3-4 on the neutral view, which measures 4 mm with flexion and 2 mm with extension. There is 3 mm retrolisthesis at L4-5, which measures 3 mm with flexion and extension. Mild bilateral lumbar facet arthropathy, most prominent in the mid lumbar spine. No aggressive appearing focal osseous lesions.  IMPRESSION: 1. Mild-to-moderate multilevel lumbar degenerative disc disease, most prominent at L5-S1. 2. Mild multilevel lumbar facet arthropathy. 3. Mild multilevel lumbar spondylolisthesis as detailed, with slight dynamic instability at L3-4.   Electronically Signed By: Delbert Phenix M.D. On: 11/28/2019 16:50        Sacroiliac Joint Imaging: Sacroiliac Joint DG: Results for orders placed during the hospital encounter of 11/27/19 DG Si Joints  Narrative CLINICAL DATA:  Chronic low back and bilateral hip pain. No reported injury.  EXAM: BILATERAL SACROILIAC JOINTS - 3+ VIEW  COMPARISON:  None.  FINDINGS: The sacroiliac joint spaces are maintained  and there is no evidence of arthropathy. Mild spondylosis noted in the visualized lower lumbar spine. No suspicious focal osseous lesions.  IMPRESSION: No evidence of sacroiliac arthropathy. Mild spondylosis in the visualized lower lumbar spine.   Electronically Signed By: Delbert Phenix M.D. On: 11/28/2019 16:26  Hip Imaging: Hip-R DG 2-3 views: Results for orders placed during the hospital encounter of 11/27/19 DG HIP UNILAT W OR W/O PELVIS 2-3 VIEWS RIGHT  Narrative CLINICAL DATA:  Chronic low back and right hip pain. No reported injury.  EXAM: DG HIP (WITH OR WITHOUT PELVIS) 2-3V RIGHT  COMPARISON:  None.  FINDINGS: No pelvic fracture or diastasis. No right hip fracture or dislocation. No suspicious focal osseous lesions. Tiny marginal osteophytes at the inferior right humeral head and superolateral acetabular margin. Mild degenerative changes in the visualized lower lumbar spine.  IMPRESSION: Minimal right hip osteoarthritis.   Electronically Signed By: Delbert Phenix M.D. On: 11/28/2019 16:23  Hip-L DG 2-3 views: Results for orders placed during the hospital encounter of 11/27/19 DG HIP UNILAT W OR W/O PELVIS 2-3 VIEWS LEFT  Narrative CLINICAL DATA:  Chronic low back and bilateral hip pain. No reported injury.  EXAM: DG HIP (WITH OR WITHOUT PELVIS) 2-3V LEFT  COMPARISON:  None.  FINDINGS: No left hip fracture or dislocation.  No suspicious focal osseous lesions. No significant left hip arthropathy. No radiopaque foreign bodies.  IMPRESSION: No significant left hip arthropathy.  No acute osseous abnormality.   Electronically Signed By: Ilona Sorrel M.D. On: 11/28/2019 16:24  Knee Imaging: Knee-L DG 4 views: Results for orders placed during the hospital encounter of 07/06/14 DG Knee Complete 4 Views Left  Narrative CLINICAL DATA:  Pain in left knee 6 days after fall; pt states that she fell due to ice on the ground, fell onto her left knee and  felt the knee popped; no previous injury; pain on both lateral and medial sides of the knee, pain radiates to hip and ankle sometime but not much per pt  EXAM: LEFT KNEE - COMPLETE 4+ VIEW  COMPARISON:  None.  FINDINGS: No fracture or dislocation.  There are no significant arthropathic changes.  No joint effusion.  Soft tissues are unremarkable.  IMPRESSION: Negative.   Electronically Signed By: Lajean Manes M.D. On: 07/06/2014 10:53  Complexity Note: Imaging results reviewed. Results shared with Brandy [name], using Layman's terms.                        Meds   Current Outpatient Medications:  .  albuterol (VENTOLIN HFA) 108 (90 Base) MCG/ACT inhaler, Inhale 2 puffs into the lungs every 6 (six) hours as needed for wheezing or shortness of breath., Disp: 18 g, Rfl: 5 .  ALPRAZolam (XANAX) 1 MG tablet, Take 1 mg by mouth daily as needed for anxiety. , Disp: , Rfl:  .  amitriptyline (ELAVIL) 25 MG tablet, Take 3 tablets (75 mg total) by mouth at bedtime., Disp: 30 tablet, Rfl: 0 .  benzonatate (TESSALON) 100 MG capsule, Take 1 capsule (100 mg total) by mouth 3 (three) times daily as needed for cough., Disp: 60 capsule, Rfl: 0 .  ergocalciferol (VITAMIN D2) 1.25 MG (50000 UT) capsule, Take 1 capsule (50,000 Units total) by mouth once a week., Disp: 12 capsule, Rfl: 3 .  fluticasone (FLOVENT HFA) 110 MCG/ACT inhaler, Inhale 2 puffs into the lungs 2 (two) times daily., Disp: 1 Inhaler, Rfl: 5 .  guaiFENesin (MUCINEX) 600 MG 12 hr tablet, Take 1 tablet (600 mg total) by mouth 2 (two) times daily., Disp: 60 tablet, Rfl: 2 .  letrozole (FEMARA) 2.5 MG tablet, TAKE 1 TABLET BY MOUTH ONCE DAILY (Patient taking differently: Take 2.5 mg by mouth daily. ), Disp: 30 tablet, Rfl: 5 .  Magnesium 500 MG TABS, Take 1 tablet (500 mg total) by mouth in the morning and at bedtime., Disp: 60 tablet, Rfl: 1 .  omeprazole (PRILOSEC) 10 MG capsule, Take 20 mg by mouth daily. , Disp: , Rfl:  .   ondansetron (ZOFRAN) 8 MG tablet, Take 1 tablet (8 mg total) by mouth every 8 (eight) hours as needed for nausea or vomiting., Disp: 30 tablet, Rfl: 3 .  prednisoLONE acetate (PRED FORTE) 1 % ophthalmic suspension, Place 1 drop into the left eye 4 (four) times daily., Disp: , Rfl:  .  VERZENIO 100 MG tablet, TAKE 1 TABLET (100 MG TOTAL) BY MOUTH 2 (TWO) TIMES DAILY. (Patient taking differently: Take 100 mg by mouth 2 (two) times daily. ), Disp: 56 tablet, Rfl: 0 .  albuterol (PROVENTIL) (2.5 MG/3ML) 0.083% nebulizer solution, Take 3 mLs (2.5 mg total) by nebulization every 6 (six) hours as needed for wheezing or shortness of breath. (Patient not taking: Reported on 12/20/2019), Disp: 75 mL, Rfl: 2 .  atorvastatin (LIPITOR) 10 MG tablet, Take 10 mg by mouth daily. (Patient not taking: Reported on 12/20/2019), Disp: , Rfl:  .  Cyanocobalamin (VITAMIN B 12 PO), Take 1 tablet by mouth daily. (Patient not taking: Reported on 11/27/2019), Disp: , Rfl:  .  doxycycline (VIBRAMYCIN) 100 MG capsule, doxycycline hyclate 100 mg capsule  TAKE 1 CAPSULE BY MOUTH TWICE DAILY FOR 10 DAYS AS DIRECTED, Disp: , Rfl:  .  Ferrous Sulfate (IRON PO), Take 1 tablet by mouth daily. (Patient not taking: Reported on 11/27/2019), Disp: , Rfl:  .  guaiFENesin-dextromethorphan (ROBITUSSIN DM) 100-10 MG/5ML syrup, Take 10 mLs by mouth every 4 (four) hours as needed for cough. (Patient not taking: Reported on 11/27/2019), Disp: 118 mL, Rfl: 0 .  HYDROcodone-acetaminophen (NORCO/VICODIN) 5-325 MG tablet, Take 1-2 tablets by mouth every 6 (six) hours as needed for moderate pain. (Patient not taking: Reported on 11/27/2019), Disp: , Rfl:  .  HYDROcodone-homatropine (HYCODAN) 5-1.5 MG/5ML syrup, Take 5 mLs by mouth every 6 (six) hours as needed for cough. (Patient not taking: Reported on 10/01/2019), Disp: 120 mL, Rfl: 0 .  potassium chloride SA (KLOR-CON) 20 MEQ tablet, potassium chloride ER 20 mEq tablet,extended release  TAKE 1 TABLET BY MOUTH  EVERY DAY AS DIRECTED, Disp: , Rfl:   Current Facility-Administered Medications:  .  0.9 %  sodium chloride infusion, 500 mL, Intravenous, Continuous, Nandigam, Kavitha V, MD  ROS  Constitutional: Denies any fever or chills Gastrointestinal: No reported hemesis, hematochezia, vomiting, or acute GI distress Musculoskeletal: Denies any acute onset joint swelling, redness, loss of ROM, or weakness Neurological: No reported episodes of acute onset apraxia, aphasia, dysarthria, agnosia, amnesia, paralysis, loss of coordination, or loss of consciousness  Allergies  Brandy [name] is allergic to zithromax [azithromycin] and psyllium.  [place]  Drug: Brandy [name]  reports no history of drug use. Alcohol:  reports no history of alcohol use. Tobacco:  reports that she quit smoking about 10 months ago. Her smoking use included cigarettes. She smoked 0.50 packs per day. She has never used smokeless tobacco. Medical:  has a past medical history of Arthritis, ASD (atrial septal defect), Cataract, GERD (gastroesophageal reflux disease), Headache, Heart murmur, Legally blind in right eye, as defined in [place], Lumbar herniated disc, and Sciatica. Surgical: Brandy [name]  has a past surgical history that includes Breast surgery (Bilateral, 2011); Cataract extraction; Bunionectomy; Cleft palate repair; Cardiac surgery; Ablation; transthoracic echocardiogram; Cardiac catheterization; Lumbar laminectomy/decompression microdiscectomy (Left, 05/23/2016); Lumbar laminectomy/decompression microdiscectomy (Left, 05/23/2016); and Shoulder injection (Left, 05/23/2016). Family: family history includes Cancer in her father; Diabetes in her mother; High blood pressure in her mother.  Constitutional Exam  General appearance: Well nourished, well developed, and well hydrated. In no apparent acute distress Vitals:   12/25/19 0939  BP: 128/77  Pulse: 92  Resp: 16  Temp: (!) 97.3 F (36.3 C)  TempSrc: Temporal  SpO2: 100%   Weight: 231 lb 12.8 oz (105.1 kg)  Height: '5\' 8"'$  (1.727 m)   BMI Assessment: Estimated body mass index is 35.25 kg/m as calculated from the following:   Height as of this encounter: '5\' 8"'$  (1.727 m).   Weight as of this encounter: 231 lb 12.8 oz (105.1 kg).  BMI interpretation table: BMI level Category Range association with higher incidence of chronic pain  <18 kg/m2 Underweight   18.5-24.9 kg/m2 Ideal body weight   25-29.9 kg/m2 Overweight Increased incidence by 20%  30-34.9 kg/m2 Obese (Class I) Increased incidence by 68%  35-39.9 kg/m2 Severe obesity (  Class II) Increased incidence by 136%  >40 kg/m2 Extreme obesity (Class III) Increased incidence by 254%   Patient's current BMI Ideal Body weight  Body mass index is 35.25 kg/m. Ideal body weight: 63.9 kg (140 lb 14 oz) Adjusted ideal body weight: 80.4 kg (177 lb 3.9 oz)   BMI Readings from Last 4 Encounters:  12/25/19 35.25 kg/m  12/20/19 36.43 kg/m  11/27/19 36.02 kg/m  11/01/19 36.18 kg/m   Wt Readings from Last 4 Encounters:  12/25/19 231 lb 12.8 oz (105.1 kg)  12/20/19 232 lb 9.6 oz (105.5 kg)  11/27/19 230 lb (104.3 kg)  11/01/19 231 lb (104.8 kg)   Assessment & Plan  Primary Diagnosis & Pertinent Problem List: The primary encounter diagnosis was Chronic pain syndrome. 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  Visit Diagnosis: 1. Chronic pain syndrome   2. Chronic low back pain (1ry area of Pain) (Bilateral) w/ sciatica (Bilateral)   3. Chronic lower extremity pain (2ry area of Pain) (Bilateral) (R>L)   4. Chronic hip pain (3ry area  of Pain) (Bilateral) (R>L)   5. 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 Grade 1 Anterolisthesis (L2-3, L3-4)   7. Grade 1 Retrolisthesis (L4-5, L5-S1)   8. Lumbar facet syndrome (Multilevel) (Bilateral) (R>L)    Problems updated and reviewed during this visit: No problems updated.  Plan of Care  Pharmacotherapy (Medications Ordered): No orders of the defined types were placed in this encounter.   Procedure Orders     LUMBAR FACET(MEDIAL BRANCH NERVE BLOCK) MBNB Lab Orders  No laboratory test(s) ordered today   Imaging Orders  No imaging studies ordered today   Referral Orders  No referral(s) requested today    Pharmacological management options:  Opioid Analgesics: I will not be prescribing any opioids at this time Membrane stabilizer: None prescribed at this time Muscle relaxant: None prescribed at this time NSAID: None prescribed at this time Other analgesic(s): None prescribed at this time    Interventional management options: Planned, scheduled, and/or pending:    Diagnostic bilateral lumbar facet block #1    Considering:   Diagnostic bilateral lumbar facet block  Diagnostic caudal ESI + epidurogram  Diagnostic left sacroiliac joint block  Diagnostic right IA hip joint injection    PRN Procedures:   None at this time    Provider-requested follow-up: Return for Procedure (w/ sedation): (B) L-FCT BLK #1. Recent Visits Date Type Provider Dept  11/27/19 Office Visit Milinda Pointer, MD [facility]  Showing recent visits within past 90 days and meeting all other requirements Today's Visits Date Type Provider Dept  12/25/19 Office Visit Milinda Pointer, MD [facility]  Showing today's visits and meeting all other requirements Future Appointments No visits were found meeting these conditions. Showing future appointments within next 90 days and meeting all other requirements  Primary Care Physician: [facility]  Care Note by: Gaspar Cola, MD Date: 12/25/2019; Time: 10:42 AM

## 2019-12-25 ENCOUNTER — Encounter: Payer: Self-pay | Admitting: Pain Medicine

## 2019-12-25 ENCOUNTER — Other Ambulatory Visit: Payer: Self-pay

## 2019-12-25 ENCOUNTER — Ambulatory Visit: Payer: BLUE CROSS/BLUE SHIELD | Attending: Pain Medicine | Admitting: Pain Medicine

## 2019-12-25 VITALS — BP 128/77 | HR 92 | Temp 97.3°F | Resp 16 | Ht 68.0 in | Wt 231.8 lb

## 2019-12-25 DIAGNOSIS — M79604 Pain in right leg: Secondary | ICD-10-CM

## 2019-12-25 DIAGNOSIS — M47816 Spondylosis without myelopathy or radiculopathy, lumbar region: Secondary | ICD-10-CM

## 2019-12-25 DIAGNOSIS — M79605 Pain in left leg: Secondary | ICD-10-CM | POA: Diagnosis present

## 2019-12-25 DIAGNOSIS — M25552 Pain in left hip: Secondary | ICD-10-CM

## 2019-12-25 DIAGNOSIS — M25551 Pain in right hip: Secondary | ICD-10-CM | POA: Diagnosis present

## 2019-12-25 DIAGNOSIS — M5441 Lumbago with sciatica, right side: Secondary | ICD-10-CM

## 2019-12-25 DIAGNOSIS — M961 Postlaminectomy syndrome, not elsewhere classified: Secondary | ICD-10-CM | POA: Diagnosis present

## 2019-12-25 DIAGNOSIS — G8929 Other chronic pain: Secondary | ICD-10-CM | POA: Diagnosis present

## 2019-12-25 DIAGNOSIS — G894 Chronic pain syndrome: Secondary | ICD-10-CM | POA: Diagnosis present

## 2019-12-25 DIAGNOSIS — M5442 Lumbago with sciatica, left side: Secondary | ICD-10-CM

## 2019-12-25 DIAGNOSIS — M4310 Spondylolisthesis, site unspecified: Secondary | ICD-10-CM | POA: Diagnosis present

## 2019-12-31 ENCOUNTER — Encounter: Payer: Self-pay | Admitting: Pain Medicine

## 2019-12-31 ENCOUNTER — Ambulatory Visit
Admission: RE | Admit: 2019-12-31 | Discharge: 2019-12-31 | Disposition: A | Discharge status: Home / Self Care | Payer: BLUE CROSS/BLUE SHIELD | Source: Ambulatory Visit | Attending: Pain Medicine | Admitting: Pain Medicine

## 2019-12-31 ENCOUNTER — Ambulatory Visit (HOSPITAL_BASED_OUTPATIENT_CLINIC_OR_DEPARTMENT_OTHER): Payer: BLUE CROSS/BLUE SHIELD | Admitting: Pain Medicine

## 2019-12-31 ENCOUNTER — Other Ambulatory Visit: Payer: Self-pay

## 2019-12-31 VITALS — BP 130/74 | HR 93 | Temp 97.4°F | Resp 15 | Ht 67.0 in | Wt 231.0 lb

## 2019-12-31 DIAGNOSIS — M545 Low back pain, unspecified: Secondary | ICD-10-CM

## 2019-12-31 DIAGNOSIS — M5137 Other intervertebral disc degeneration, lumbosacral region: Secondary | ICD-10-CM | POA: Insufficient documentation

## 2019-12-31 DIAGNOSIS — M4310 Spondylolisthesis, site unspecified: Secondary | ICD-10-CM

## 2019-12-31 DIAGNOSIS — G8929 Other chronic pain: Secondary | ICD-10-CM | POA: Insufficient documentation

## 2019-12-31 DIAGNOSIS — M47817 Spondylosis without myelopathy or radiculopathy, lumbosacral region: Secondary | ICD-10-CM

## 2019-12-31 DIAGNOSIS — M47816 Spondylosis without myelopathy or radiculopathy, lumbar region: Secondary | ICD-10-CM | POA: Diagnosis present

## 2019-12-31 MED ORDER — LIDOCAINE HCL 2 % IJ SOLN
INTRAMUSCULAR | Status: AC
  Filled 2019-12-31: qty 20

## 2019-12-31 MED ORDER — TRIAMCINOLONE ACETONIDE 40 MG/ML IJ SUSP
INTRAMUSCULAR | Status: AC
  Filled 2019-12-31: qty 2

## 2019-12-31 MED ORDER — MIDAZOLAM HCL 5 MG/5ML IJ SOLN
1.0000 mg | INTRAMUSCULAR | Status: DC | PRN
  Administered 2019-12-31: 2 mg via INTRAVENOUS

## 2019-12-31 MED ORDER — ROPIVACAINE HCL 2 MG/ML IJ SOLN
18.0000 mL | Freq: Once | INTRAMUSCULAR | Status: AC
  Administered 2019-12-31: 18 mL via PERINEURAL

## 2019-12-31 MED ORDER — MIDAZOLAM HCL 5 MG/5ML IJ SOLN
INTRAMUSCULAR | Status: AC
  Filled 2019-12-31: qty 5

## 2019-12-31 MED ORDER — LIDOCAINE HCL 2 % IJ SOLN
20.0000 mL | Freq: Once | INTRAMUSCULAR | Status: AC
  Administered 2019-12-31: 400 mg

## 2019-12-31 MED ORDER — LACTATED RINGERS IV SOLN
1000.0000 mL | Freq: Once | INTRAVENOUS | Status: AC
  Administered 2019-12-31: 1000 mL via INTRAVENOUS

## 2019-12-31 MED ORDER — ROPIVACAINE HCL 2 MG/ML IJ SOLN
INTRAMUSCULAR | Status: AC
  Filled 2019-12-31: qty 20

## 2019-12-31 MED ORDER — FENTANYL CITRATE (PF) 100 MCG/2ML IJ SOLN
25.0000 ug | INTRAMUSCULAR | Status: DC | PRN
  Administered 2019-12-31: 50 ug via INTRAVENOUS

## 2019-12-31 MED ORDER — FENTANYL CITRATE (PF) 100 MCG/2ML IJ SOLN
INTRAMUSCULAR | Status: AC
  Filled 2019-12-31: qty 2

## 2019-12-31 MED ORDER — TRIAMCINOLONE ACETONIDE 40 MG/ML IJ SUSP
80.0000 mg | Freq: Once | INTRAMUSCULAR | Status: AC
  Administered 2019-12-31: 80 mg

## 2019-12-31 NOTE — Progress Notes (Signed)
Safety precautions to be maintained throughout the outpatient stay will include: orient to surroundings, keep bed in low position, maintain call bell within reach at all times, provide assistance with transfer out of bed and ambulation.  

## 2019-12-31 NOTE — Progress Notes (Signed)
PROVIDER NOTE: Information contained herein reflects review and annotations entered in association with encounter. Interpretation of such information and data should be left to medically-trained personnel. Information provided to patient can be located elsewhere in the medical record under "Patient Instructions". Document created using STT-dictation technology, any transcriptional errors that may result from process are unintentional.    Patient: Brandy [name] [facility]  Service Category: Procedure  Provider: Gaspar Cola, MD  DOB: July 26, 1965  DOS: 12/31/2019  Location: [facility]  MRN: [id number]  Setting: Ambulatory - outpatient  Referring Provider: [facility]  Type: Established Patient  Specialty: Interventional Pain Management  PCP: [facility]   Primary Reason for Visit: Interventional Pain Management Treatment. CC: Back Pain (low)  Procedure:          Anesthesia, Analgesia, Anxiolysis:  Type: Lumbar Facet, Medial Branch Block(s)          Primary Purpose: Diagnostic Region: Posterolateral Lumbosacral Spine Level: L2, L3, L4, L5, & S1 Medial Branch Level(s). Injecting these levels blocks the L3-4, L4-5, and L5-S1 lumbar facet joints. Laterality: Bilateral  Type: Moderate (Conscious) Sedation combined with Local Anesthesia Indication(s): Analgesia and Anxiety Route: Intravenous (IV) IV Access: Secured Sedation: Meaningful verbal contact was maintained at all times during the procedure  Local Anesthetic: Lidocaine 1-2%  Position: Prone   Indications: 1. Spondylosis without myelopathy or radiculopathy, lumbosacral region   2. Lumbar facet syndrome (Multilevel) (Bilateral) (R>L)   3. Lumbar facet hypertrophy (Multilevel) (Bilateral)   4. Grade 1 Anterolisthesis (L2-3, L3-4)   5. Grade 1 Retrolisthesis (L4-5, L5-S1)   6. DDD (degenerative disc disease), lumbosacral   7. Chronic low back pain (Bilateral) w/o sciatica    Pain  Score: Pre-procedure: 10-Worst pain ever/10 Post-procedure: 0-No pain/10   Pre-op Assessment:  Brandy [name] is a 54 y.o. (year old), female patient, seen today for interventional treatment. She  has a past surgical history that includes Breast surgery (Bilateral, 2011); Cataract extraction; Bunionectomy; Cleft palate repair; Cardiac surgery; Ablation; transthoracic echocardiogram; Cardiac catheterization; Lumbar laminectomy/decompression microdiscectomy (Left, 05/23/2016); Lumbar laminectomy/decompression microdiscectomy (Left, 05/23/2016); and Shoulder injection (Left, 05/23/2016). Brandy [name] has a current medication list which includes the following prescription(s): albuterol, alprazolam, amitriptyline, benzonatate, ergocalciferol, flovent hfa, mucinex, letrozole, magnesium, omeprazole, ondansetron, prednisolone acetate, verzenio, and amitriptyline, and the following Facility-Administered Medications: sodium chloride, fentanyl, and midazolam. Her primarily concern today is the Back Pain (low)  Initial Vital Signs:  Pulse/HCG Rate: 93ECG Heart Rate: 97 Temp: (!) 97.3 F (36.3 C) Resp: 20 BP: (!) 131/78 SpO2: 100 %  BMI: Estimated body mass index is 36.18 kg/m as calculated from the following:   Height as of this encounter: 5\' 7"  (1.702 m).   Weight as of this encounter: 231 lb (104.8 kg).  Risk Assessment: Allergies: Reviewed. She is allergic to zithromax [azithromycin] and psyllium.  Allergy Precautions: None required Coagulopathies: Reviewed. None identified.  Blood-thinner therapy: None at this time Active Infection(s): Reviewed. None identified. Brandy [name] is afebrile  Site Confirmation: Brandy [name] was asked to confirm the procedure and laterality before marking the site Procedure checklist: Completed Consent: Before the procedure and under the influence of no sedative(s), amnesic(s), or anxiolytics, the patient was informed of the treatment options, risks and possible  complications. To fulfill our ethical and legal obligations, as recommended by the American Medical Association's Code of Ethics, I have informed the patient of my clinical impression; the nature and purpose of the treatment or procedure; the risks, benefits, and possible  complications of the intervention; the alternatives, including doing nothing; the risk(s) and benefit(s) of the alternative treatment(s) or procedure(s); and the risk(s) and benefit(s) of doing nothing. The patient was provided information about the general risks and possible complications associated with the procedure. These may include, but are not limited to: failure to achieve desired goals, infection, bleeding, organ or nerve damage, allergic reactions, paralysis, and death. In addition, the patient was informed of those risks and complications associated to Spine-related procedures, such as failure to decrease pain; infection (i.e.: Meningitis, epidural or intraspinal abscess); bleeding (i.e.: epidural hematoma, subarachnoid hemorrhage, or any other type of intraspinal or peri-dural bleeding); organ or nerve damage (i.e.: Any type of peripheral nerve, nerve root, or spinal cord injury) with subsequent damage to sensory, motor, and/or autonomic systems, resulting in permanent pain, numbness, and/or weakness of one or several areas of the body; allergic reactions; (i.e.: anaphylactic reaction); and/or death. Furthermore, the patient was informed of those risks and complications associated with the medications. These include, but are not limited to: allergic reactions (i.e.: anaphylactic or anaphylactoid reaction(s)); adrenal axis suppression; blood sugar elevation that in diabetics may result in ketoacidosis or comma; water retention that in patients with history of congestive heart failure may result in shortness of breath, pulmonary edema, and decompensation with resultant heart failure; weight gain; swelling or edema; medication-induced  neural toxicity; particulate matter embolism and blood vessel occlusion with resultant organ, and/or nervous system infarction; and/or aseptic necrosis of one or more joints. Finally, the patient was informed that Medicine is not an exact science; therefore, there is also the possibility of unforeseen or unpredictable risks and/or possible complications that may result in a catastrophic outcome. The patient indicated having understood very clearly. We have given the patient no guarantees and we have made no promises. Enough time was given to the patient to ask questions, all of which were answered to the patient's satisfaction. Brandy [name] has indicated that she wanted to continue with the procedure. Attestation: I, the ordering provider, attest that I have discussed with the patient the benefits, risks, side-effects, alternatives, likelihood of achieving goals, and potential problems during recovery for the procedure that I have provided informed consent. Date  Time: 12/31/2019  8:08 AM  Pre-Procedure Preparation:  Monitoring: As per clinic protocol. Respiration, ETCO2, SpO2, BP, heart rate and rhythm monitor placed and checked for adequate function Safety Precautions: Patient was assessed for positional comfort and pressure points before starting the procedure. Time-out: I initiated and conducted the "Time-out" before starting the procedure, as per protocol. The patient was asked to participate by confirming the accuracy of the "Time Out" information. Verification of the correct person, site, and procedure were performed and confirmed by me, the nursing staff, and the patient. "Time-out" conducted as per Joint Commission's Universal Protocol (UP.01.01.01). Time: 3244  Description of Procedure:          Laterality: Bilateral. The procedure was performed in identical fashion on both sides. Levels:  L2, L3, L4, L5, & S1 Medial Branch Level(s) Area Prepped: Posterior Lumbosacral Region DuraPrep  (Iodine Povacrylex [0.7% available iodine] and Isopropyl Alcohol, 74% w/w) Safety Precautions: Aspiration looking for blood return was conducted prior to all injections. At no point did we inject any substances, as a needle was being advanced. Before injecting, the patient was told to immediately notify me if she was experiencing any new onset of "ringing in the ears, or metallic taste in the mouth". No attempts were made at seeking any paresthesias. Safe injection  practices and needle disposal techniques used. Medications properly checked for expiration dates. SDV (single dose vial) medications used. After the completion of the procedure, all disposable equipment used was discarded in the proper designated medical waste containers. Local Anesthesia: Protocol guidelines were followed. The patient was positioned over the fluoroscopy table. The area was prepped in the usual manner. The time-out was completed. The target area was identified using fluoroscopy. A 12-in long, straight, sterile hemostat was used with fluoroscopic guidance to locate the targets for each level blocked. Once located, the skin was marked with an approved surgical skin marker. Once all sites were marked, the skin (epidermis, dermis, and hypodermis), as well as deeper tissues (fat, connective tissue and muscle) were infiltrated with a small amount of a short-acting local anesthetic, loaded on a 10cc syringe with a 25G, 1.5-in  Needle. An appropriate amount of time was allowed for local anesthetics to take effect before proceeding to the next step. Local Anesthetic: Lidocaine 2.0% The unused portion of the local anesthetic was discarded in the proper designated containers. Technical explanation of process:  L2 Medial Branch Nerve Block (MBB): The target area for the L2 medial branch is at the junction of the postero-lateral aspect of the superior articular process and the superior, posterior, and medial edge of the transverse process of L3.  Under fluoroscopic guidance, a Quincke needle was inserted until contact was made with os over the superior postero-lateral aspect of the pedicular shadow (target area). After negative aspiration for blood, 0.5 mL of the nerve block solution was injected without difficulty or complication. The needle was removed intact. L3 Medial Branch Nerve Block (MBB): The target area for the L3 medial branch is at the junction of the postero-lateral aspect of the superior articular process and the superior, posterior, and medial edge of the transverse process of L4. Under fluoroscopic guidance, a Quincke needle was inserted until contact was made with os over the superior postero-lateral aspect of the pedicular shadow (target area). After negative aspiration for blood, 0.5 mL of the nerve block solution was injected without difficulty or complication. The needle was removed intact. L4 Medial Branch Nerve Block (MBB): The target area for the L4 medial branch is at the junction of the postero-lateral aspect of the superior articular process and the superior, posterior, and medial edge of the transverse process of L5. Under fluoroscopic guidance, a Quincke needle was inserted until contact was made with os over the superior postero-lateral aspect of the pedicular shadow (target area). After negative aspiration for blood, 0.5 mL of the nerve block solution was injected without difficulty or complication. The needle was removed intact. L5 Medial Branch Nerve Block (MBB): The target area for the L5 medial branch is at the junction of the postero-lateral aspect of the superior articular process and the superior, posterior, and medial edge of the sacral ala. Under fluoroscopic guidance, a Quincke needle was inserted until contact was made with os over the superior postero-lateral aspect of the pedicular shadow (target area). After negative aspiration for blood, 0.5 mL of the nerve block solution was injected without difficulty or  complication. The needle was removed intact. S1 Medial Branch Nerve Block (MBB): The target area for the S1 medial branch is at the posterior and inferior 6 o'clock position of the L5-S1 facet joint. Under fluoroscopic guidance, the Quincke needle inserted for the L5 MBB was redirected until contact was made with os over the inferior and postero aspect of the sacrum, at the 6 o' clock position  under the L5-S1 facet joint (Target area). After negative aspiration for blood, 0.5 mL of the nerve block solution was injected without difficulty or complication. The needle was removed intact.  Nerve block solution: 0.2% PF-Ropivacaine + Triamcinolone (40 mg/mL) diluted to a final concentration of 4 mg of Triamcinolone/mL of Ropivacaine The unused portion of the solution was discarded in the proper designated containers. Procedural Needles: 22-gauge, 3.5-inch, Quincke needles used for all levels.  Once the entire procedure was completed, the treated area was cleaned, making sure to leave some of the prepping solution back to take advantage of its long term bactericidal properties.   Illustration of the posterior view of the lumbar spine and the posterior neural structures. Laminae of L2 through S1 are labeled. DPRL5, dorsal primary ramus of L5; DPRS1, dorsal primary ramus of S1; DPR3, dorsal primary ramus of L3; FJ, facet (zygapophyseal) joint L3-L4; I, inferior articular process of L4; LB1, lateral branch of dorsal primary ramus of L1; IAB, inferior articular branches from L3 medial branch (supplies L4-L5 facet joint); IBP, intermediate branch plexus; MB3, medial branch of dorsal primary ramus of L3; NR3, third lumbar nerve root; S, superior articular process of L5; SAB, superior articular branches from L4 (supplies L4-5 facet joint also); TP3, transverse process of L3.  Vitals:   12/31/19 0846 12/31/19 0856 12/31/19 0906 12/31/19 0918  BP: (!) 135/92 126/82 126/76 (!) 130/74  Pulse:      Resp: 16 13 18 15     Temp:  (!) 97.4 F (36.3 C)  (!) 97.4 F (36.3 C)  SpO2: 100% 100% 100% 100%  Weight:      Height:         Start Time: 0837 hrs. End Time: 0846 hrs.  Imaging Guidance (Spinal):          Type of Imaging Technique: Fluoroscopy Guidance (Spinal) Indication(s): Assistance in needle guidance and placement for procedures requiring needle placement in or near specific anatomical locations not easily accessible without such assistance. Exposure Time: Please see nurses notes. Contrast: None used. Fluoroscopic Guidance: I was personally present during the use of fluoroscopy. "Tunnel Vision Technique" used to obtain the best possible view of the target area. Parallax error corrected before commencing the procedure. "Direction-depth-direction" technique used to introduce the needle under continuous pulsed fluoroscopy. Once target was reached, antero-posterior, oblique, and lateral fluoroscopic projection used confirm needle placement in all planes. Images permanently stored in EMR. Interpretation: No contrast injected. I personally interpreted the imaging intraoperatively. Adequate needle placement confirmed in multiple planes. Permanent images saved into the patient's record.  Antibiotic Prophylaxis:   Anti-infectives (From admission, onward)   None     Indication(s): None identified  Post-operative Assessment:  Post-procedure Vital Signs:  Pulse/HCG Rate: 9381 Temp: (!) 97.4 F (36.3 C) Resp: 15 BP: (!) 130/74 SpO2: 100 %  EBL: None  Complications: No immediate post-treatment complications observed by team, or reported by patient.  Note: The patient tolerated the entire procedure well. A repeat set of vitals were taken after the procedure and the patient was kept under observation following institutional policy, for this type of procedure. Post-procedural neurological assessment was performed, showing return to baseline, prior to discharge. The patient was provided with post-procedure  discharge instructions, including a section on how to identify potential problems. Should any problems arise concerning this procedure, the patient was given instructions to immediately contact us, at any time, without hesitation. In any case, we plan to contact the patient by telephone for a follow-up status report  regarding this interventional procedure.  Comments:  No additional relevant information.  Plan of Care  Orders:  Orders Placed This Encounter  Procedures  . LUMBAR FACET(MEDIAL BRANCH NERVE BLOCK) MBNB    Scheduling Instructions:     Procedure: Lumbar facet block (AKA.: Lumbosacral medial branch nerve block)     Side: Bilateral     Level: L3-4, L4-5, & L5-S1 Facets (L2, L3, L4, L5, & S1 Medial Branch Nerves)     Sedation: Patient's choice.     Timeframe: Today    Order Specific Question:   Where will this procedure be performed?    Answer:   [facility]  . [facility] C-ARM 1-60 MIN NO REPORT    Intraoperative interpretation by procedural physician at [facility].    Standing Status:   Standing    Number of Occurrences:   1    Order Specific Question:   Reason for exam:    Answer:   Assistance in needle guidance and placement for procedures requiring needle placement in or near specific anatomical locations not easily accessible without such assistance.  . Informed Consent Details: Physician/Practitioner Attestation; Transcribe to consent form and obtain patient signature    Nursing Order: Transcribe to consent form and obtain patient signature. Note: Always confirm laterality of pain with Ms. [place], before procedure. Procedure: Lumbar Facet Block  under fluoroscopic guidance Indication/Reason: Low Back Pain, with our without leg pain, due to Facet Joint Arthralgia (Joint Pain) known as Lumbar Facet Syndrome, secondary to Lumbar, and/or Lumbosacral Spondylosis (Arthritis of the Spine), without myelopathy or radiculopathy (Nerve Damage). Provider  Attestation: I, [place] Dossie Arbour, MD, (Pain Management Specialist), the physician/practitioner, attest that I have discussed with the patient the benefits, risks, side effects, alternatives, likelihood of achieving goals and potential problems during recovery for the procedure that I have provided informed consent.  . Provide equipment / supplies at bedside    Equipment required: Single use, disposable, "Block Tray"    Standing Status:   Standing    Number of Occurrences:   1    Order Specific Question:   Specify    Answer:   Block Tray   Chronic Opioid Analgesic:  None Highest recorded MME/day: 45 mg/day MME/day: 0 mg/day   Medications ordered for procedure: Meds ordered this encounter  Medications  . lidocaine (XYLOCAINE) 2 % (with pres) injection 400 mg  . lactated ringers infusion 1,000 mL  . midazolam (VERSED) 5 MG/5ML injection 1-2 mg    Make sure Flumazenil is available in the pyxis when using this medication. If oversedation occurs, administer 0.2 mg IV over 15 sec. If after 45 sec no response, administer 0.2 mg again over 1 min; may repeat at 1 min intervals; not to exceed 4 doses (1 mg)  . fentaNYL (SUBLIMAZE) injection 25-50 mcg    Make sure Narcan is available in the pyxis when using this medication. In the event of respiratory depression (RR< 8/min): Titrate NARCAN (naloxone) in increments of 0.1 to 0.2 mg IV at 2-3 minute intervals, until desired degree of reversal.  . ropivacaine (PF) 2 mg/mL (0.2%) (NAROPIN) injection 18 mL  . triamcinolone acetonide (KENALOG-40) injection 80 mg   Medications administered: We administered lidocaine, lactated ringers, midazolam, fentaNYL, ropivacaine (PF) 2 mg/mL (0.2%), and triamcinolone acetonide.  See the medical record for exact dosing, route, and time of administration.  Follow-up plan:   Return in about 2 weeks (around 01/14/2020) for 20-min, F2F, PP-e/m (PM on procedure day).  Interventional management options: Planned,  scheduled, and/or pending:    Diagnostic bilateral lumbar facet block #1    Considering:   Diagnostic bilateral lumbar facet block  Diagnostic caudal ESI + epidurogram  Diagnostic left sacroiliac joint block  Diagnostic right IA hip joint injection    PRN Procedures:   None at this time    Recent Visits Date Type Provider Dept  12/25/19 Office Visit Milinda Pointer, MD [facility]  11/27/19 Office Visit Milinda Pointer, MD [facility]  Showing recent visits within past 90 days and meeting all other requirements Today's Visits Date Type Provider Dept  12/31/19 Procedure visit Milinda Pointer, MD [facility]  Showing today's visits and meeting all other requirements Future Appointments Date Type Provider Dept  01/16/20 Appointment Milinda Pointer, MD [facility]  Showing future appointments within next 90 days and meeting all other requirements  Disposition: Discharge home  Discharge (Date  Time): 12/31/2019; 0922 hrs.   Primary Care Physician: [facility] Location: [facility] Note by: Gaspar Cola, MD Date: 12/31/2019; Time: 9:58 AM  Disclaimer:  Medicine is not an Chief Strategy Officer. The only guarantee in medicine is that nothing is guaranteed. It is important to note that the decision to proceed with this intervention was based on the information collected from the patient. The Data and conclusions were drawn from the patient's questionnaire, the interview, and the physical examination. Because the information was provided in large part by the patient, it cannot be guaranteed that it has not been purposely or unconsciously manipulated. Every effort has been made to obtain as much relevant data as possible for this evaluation. It is important to note that the conclusions that lead to this procedure are derived in large part from the available data. Always take into account that the  treatment will also be dependent on availability of resources and existing treatment guidelines, considered by other Pain Management Practitioners as being common knowledge and practice, at the time of the intervention. For Medico-Legal purposes, it is also important to point out that variation in procedural techniques and pharmacological choices are the acceptable norm. The indications, contraindications, technique, and results of the above procedure should only be interpreted and judged by a Board-Certified Interventional Pain Specialist with extensive familiarity and expertise in the same exact procedure and technique.

## 2019-12-31 NOTE — Patient Instructions (Signed)

## 2020-01-01 ENCOUNTER — Telehealth: Payer: Self-pay | Admitting: *Deleted

## 2020-01-01 NOTE — Telephone Encounter (Signed)
No problems post procedure. 

## 2020-01-02 ENCOUNTER — Encounter: Payer: Self-pay | Admitting: Rehabilitation

## 2020-01-02 ENCOUNTER — Other Ambulatory Visit: Payer: Self-pay

## 2020-01-02 ENCOUNTER — Ambulatory Visit: Payer: BLUE CROSS/BLUE SHIELD | Attending: Hematology | Admitting: Rehabilitation

## 2020-01-02 ENCOUNTER — Other Ambulatory Visit: Payer: Self-pay | Admitting: Hematology

## 2020-01-02 DIAGNOSIS — M6281 Muscle weakness (generalized): Secondary | ICD-10-CM | POA: Insufficient documentation

## 2020-01-02 DIAGNOSIS — R2689 Other abnormalities of gait and mobility: Secondary | ICD-10-CM | POA: Diagnosis not present

## 2020-01-02 NOTE — Therapy (Addendum)
[place], [place], [address] Phone: [phone]   Fax:  [phone]  Physical Therapy Evaluation  Patient Details  Name: Brandy [name] MRN: [id number] Date of Birth: [birth year] Referring Provider (PT): Dr. Irene Limbo   Encounter Date: 01/02/2020   PT End of Session - 01/02/20 1003    Visit Number 1    Number of Visits 13    Date for PT Re-Evaluation 02/13/20    PT Start Time 0905    PT Stop Time 0957    PT Time Calculation (min) 52 min    Activity Tolerance Patient tolerated treatment well    Behavior During Therapy [facility] for tasks assessed/performed           Past Medical History:  Diagnosis Date  . Arthritis   . ASD (atrial septal defect)    s/p closure with Amplatzer device 10/05/04 (Dr. Myriam Jacobson, [facility]) 10/05/04  . Cataract   . GERD (gastroesophageal reflux disease)   . Headache   . Heart murmur    no longer heard  . Legally blind in right eye, as defined in [place]   . Lumbar herniated disc   . Sciatica     Past Surgical History:  Procedure Laterality Date  . ABLATION    . BREAST SURGERY Bilateral 2011   Breast Reduction Surgery  . BUNIONECTOMY    . CARDIAC CATHETERIZATION     10/05/04 [facility]): LM < 25%, otherwise normal coronaries. No pulmonary HTN, Mildly enlarged RV. Secundum ASD s/p closure.  Marland Kitchen CARDIAC SURGERY    . CATARACT EXTRACTION    . CLEFT PALATE REPAIR     s/p cleft lip and palate repair  . LUMBAR LAMINECTOMY/DECOMPRESSION MICRODISCECTOMY Left 05/23/2016   Procedure: LEFT L4-L5 LATERAL RECESS DECOMPRESSION WITH CENTRAL AND RIGHT DECOMPRESSION VIA LEFT SIDE;  Surgeon: [name], MD;  Location: [place];  Service: Orthopedics;  Laterality: Left;  . LUMBAR LAMINECTOMY/DECOMPRESSION MICRODISCECTOMY Left 05/23/2016   Procedure: LUMBAR LAMINECTOMY/DECOMPRESSION MICRODISCECTOMY Lumbar five - Sacral One 1 LEVEL;  Surgeon: [name], MD;  Location: [place];  Service:  Orthopedics;  Laterality: Left;  . SHOULDER INJECTION Left 05/23/2016   Procedure: SHOULDER INJECTION;  Surgeon: [name], MD;  Location: [place];  Service: Orthopedics;  Laterality: Left;  band-aid per pa-c  . TRANSTHORACIC ECHOCARDIOGRAM     12/15/05 [facility]): Mild LVH, EF > 30%, grade 1 diastolic dysfunction, Trivial MR/PR/TR.    There were no vitals filed for this visit.    Subjective Assessment - 01/02/20 0907    Subjective I have been falling. my ankle will give out and I have alot of pain in my Rt leg and hip.  I got a spinal block this past week and that helped.    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 Taking letrozole.  Significant back history with surgery and failure, neurogenic claudication, spondylosis, hepatic stenosis, COPD?, CKD stage III, Eye removal in 2020 due to pain levels and blindness    Limitations Walking    Patient Stated Goals I need to exercise, I want to be able to go for a walk and not worry about falling    Currently in Pain? Yes    Pain Score 4     Pain Location Back   Rt foot   Pain Orientation Right    Pain Descriptors / Indicators Pressure    Pain Type Chronic  pain    Pain Onset More than a month ago    Pain Frequency Constant    Aggravating Factors  standing, taking care of my family members, cooking    Pain Relieving Factors rest              [facility] PT Assessment - 01/02/20 0001      Assessment   Medical Diagnosis metastatic breast cancer    Referring Provider (PT) Dr. Irene Limbo    Hand Dominance Right    Prior Therapy no      Precautions   Precaution Comments fall, active cancer      Restrictions   Weight Bearing Restrictions No      Balance Screen   Has the patient fallen in the past 6 months Yes    How many times? 8   walking, down stairs   Has the patient had a decrease in activity level because of a fear of falling?  No    Is the patient  reluctant to leave their home because of a fear of falling?  Yes      [place] residence    Living Arrangements Other relatives    Type of [place] to enter    Entrance Stairs-Rails [place] Two level    Additional Comments I have been falling down my stairs on the deck and inside      Prior Function   Level of Independence Independent    Vocation On disability    Vocation Requirements nursing     Leisure walking, sewing      Cognition   Overall Cognitive Status Within Functional Limits for tasks assessed      Observation/Other Assessments   Observations completely flat feet bil, shiny skin bil feel, some edema lateral malleoli, pronation and evidence of bunion surgery bil, small muscle mass bil calves      Sensation   Additional Comments prick sensation Rt lower leg WNL, Lt lower leg unable to feel lateral ankle, and dorsum ofthe foot       Coordination   Gross Motor Movements are Fluid and Coordinated Yes      Posture/Postural Control   Posture/Postural Control Postural limitations    Postural Limitations Increased lumbar lordosis;Rounded Shoulders;Forward head;Increased thoracic kyphosis      ROM / Strength   AROM / PROM / Strength AROM;Strength      Strength   Overall Strength Comments bil heel raise around 70%    Strength Assessment Site Ankle;Hip;Knee    Right/Left Hip Right;Left    Right Hip Flexion 4+/5    Right Hip ABduction 4/5    Right Hip ADduction 4/5    Left Hip Flexion 4+/5    Left Hip ABduction 4/5    Left Hip ADduction 4/5    Right/Left Knee Right;Left    Right Knee Flexion 4/5    Right Knee Extension 4+/5    Left Knee Flexion 4/5    Left Knee Extension 4+/5    Right/Left Ankle Right;Left    Right Ankle Dorsiflexion 4/5    Right Ankle Plantar Flexion 4/5    Right Ankle Inversion 4-/5    Right Ankle Eversion 4-/5    Left Ankle Dorsiflexion 4+/5    Left Ankle Plantar  Flexion 4/5    Left Ankle Inversion 4/5    Left Ankle Eversion 4/5      Standardized Balance Assessment   Standardized  Balance Assessment Berg Balance Test;Timed Up and Go Test      Berg Balance Test   Sit to Stand Able to stand without using hands and stabilize independently    Standing Unsupported Able to stand safely 2 minutes    Sitting with Back Unsupported but Feet Supported on Floor or Stool Able to sit safely and securely 2 minutes    Stand to Sit Sits safely with minimal use of hands    Transfers Able to transfer safely, minor use of hands    Standing Unsupported with Eyes Closed Able to stand 10 seconds with supervision    Standing Unsupported with Feet Together Able to place feet together independently and stand for 1 minute with supervision   more difficulty without slight knee bend   From Standing, Reach Forward with Outstretched Arm Can reach confidently >25 cm (10")    From Standing Position, Pick up Object from Floor Able to pick up shoe safely and easily    From Standing Position, Turn to Look Behind Over each Shoulder Looks behind from both sides and weight shifts well    Turn 360 Degrees Able to turn 360 degrees safely in 4 seconds or less    Standing Unsupported, Alternately Place Feet on Step/Stool Able to complete >2 steps/needs minimal assist    Standing Unsupported, One Foot in [employer] balance while stepping or standing    Standing on One Leg Tries to lift leg/unable to hold 3 seconds but remains standing independently    Total Score 44                      Objective measurements completed on examination: See above findings.       [place] Adult PT Treatment/Exercise - 01/02/20 0001      Self-Care   Self-Care Other Self-Care Comments    Other Self-Care Comments  pt will begin heel raises at the counter and start walking with her SPC in the Lt hand in the community                  PT Education - 01/02/20 1003    Education Details  POC, heel raises, cane use    Person(s) Educated Patient    Methods Explanation;Demonstration    Comprehension Verbalized understanding               PT Long Term Goals - 01/02/20 1055      PT LONG TERM GOAL #1   Title Pt will improve BERG balance scale to at least 50/56 to demonstrate decreased fall risk    Baseline 44    Time 6    Period Weeks    Status New      PT LONG TERM GOAL #2   Title Pt will be ind with LRAD for gait stability    Time 4    Period Weeks    Status New      PT LONG TERM GOAL #3   Title pt will demonstrate correct stair ascent and descent, one foot per step, with hand rail in clinic    Time 6    Period Weeks    Status New      PT LONG TERM GOAL #4   Title Pt will improve bil ankle strength to at least 4/5    Time 6    Period Weeks    Status New      PT LONG TERM GOAL #5   Title Pt will be  ind with final HEP    Time 6    Period Weeks    Status New                  Plan - 01/02/20 1004    Clinical Impression Statement Pt presents with frequent falls with gait and stairs, bil ankle weakness Rt>Lt, decreased balance and mobility most likely from chronic LBP with severe facet and joint arthropathy and narrowing, multiple back injections, failed back surgery, and sciatic type pain leading to LE weakness and frequent falls.  Pt has a SPC which she was been using occasionally in the Rt hand.  Pt was educated to use the [facility] in the left hand at least when out in the community. Pt scored a 44/56 on the BERG balance test demonstrating a moderate fall risk.  pt will benefit from PT at this time to improve balance and mobility.    Personal Factors and Comorbidities Age;Comorbidity 3+    Comorbidities active metastatic cancer, back history, other chronic medical conditions    Examination-Activity Limitations Stand;Locomotion Level;Stairs    Examination-Participation Restrictions Cleaning;Meal Prep;Community Activity    Stability/Clinical Decision  Making Stable/Uncomplicated    Clinical Decision Making Low    Rehab Potential Good    PT Frequency 2x / week    PT Duration 6 weeks    PT Treatment/Interventions ADLs/Self Care Home Management;Stair training;Gait training;Functional mobility training;Therapeutic activities;Therapeutic exercise;Patient/family education;Neuromuscular re-education;Manual techniques    PT Next Visit Plan teach ankle 4 way band and add pic of heel raises and other strengthening PRN, bring cane for training?, start basic balance work    PT Home Exercise Plan heel raises    Consulted and Agree with Plan of Care Patient           Patient will benefit from skilled therapeutic intervention in order to improve the following deficits and impairments:  Abnormal gait, Decreased mobility, Difficulty walking, Decreased strength  Visit Diagnosis: Other abnormalities of gait and mobility  Muscle weakness (generalized)     Problem List Patient Active Problem List   Diagnosis Date Noted  . Spondylosis without myelopathy or radiculopathy, lumbosacral region 12/31/2019  . Chronic low back pain (Bilateral) w/o sciatica 12/31/2019  . Chronic hip pain (3ry area of Pain) (Bilateral) (R>L) 11/27/2019  . Chronic sacroiliac joint pain (Left) 11/27/2019  . Chronic pain syndrome 11/11/2019  . Pharmacologic therapy 11/11/2019  . Disorder of skeletal system 11/11/2019  . Problems influencing health status 11/11/2019  . Abnormal MRI, lumbar spine (08/22/2018) 11/11/2019  . Chronic low back pain (1ry area of Pain) (Bilateral) w/ sciatica (Bilateral) 11/11/2019  . DDD (degenerative disc disease), lumbosacral 11/11/2019  . Grade 1 Anterolisthesis (L2-3, L3-4) 11/11/2019  . Grade 1 Retrolisthesis (L4-5, L5-S1) 11/11/2019  . Lumbar facet hypertrophy (Multilevel) (Bilateral) 11/11/2019  . Lumbar facet syndrome (Multilevel) (Bilateral) (R>L) 11/11/2019  . Chronic lower extremity pain (2ry area of Pain) (Bilateral) (R>L) 11/11/2019   . Neurogenic pain 11/11/2019  . Chronic musculoskeletal pain 11/11/2019  . Infiltrating ductal carcinoma of breast ([place]) 10/08/2019  . Asthma 09/25/2019  . Diarrhea 09/04/2019  . Hepatic steatosis 09/04/2019  . Sepsis ([place]) 09/04/2019  . Nausea & vomiting 09/04/2019  . Obesity (BMI 30-39.9) 09/04/2019  . GERD (gastroesophageal reflux disease)   . Hyperlipidemia   . CKD (chronic kidney disease), stage III   . Depression   . Malignant hypertension   . Transaminitis   . Lactic acidosis   . Pneumonitis 08/04/2019  . Breast cancer ([place]) 08/04/2019  .  Tachycardia   . Ocular proptosis 02/14/2019  . PVD (posterior vitreous detachment), left eye 02/14/2019  . Chorioretinal scar of left eye 02/14/2019  . Vitreous floaters of left eye 02/14/2019  . Bilateral leg weakness 01/24/2019  . Acute pansinusitis 08/02/2017  . Insomnia 08/02/2017  . Low back pain radiating to both legs 08/02/2017  . Migraine 08/02/2017  . Neuropathy 08/02/2017  . 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 Left shoulder tendonitis 05/23/2016    Class: Acute  . 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 Anxiety disorder 01/07/2015    Shan Levans, PT 01/02/2020, 10:58 AM  [place], [place], [address] Phone: [phone]   Fax:  [phone]  Name: Brandy [name] MRN: [id number] Date of Birth: January 25, 1966 PHYSICAL THERAPY DISCHARGE SUMMARY  Visits from Start of Care: 1  Current functional level related to goals / functional outcomes: No visits after eval   Remaining deficits: See above   Education / Equipment: none Plan: Patient agrees to discharge.  Patient goals were not met. Patient is being discharged due to not returning since the last visit.  ?????     Shan Levans, PT

## 2020-01-08 ENCOUNTER — Ambulatory Visit: Payer: BLUE CROSS/BLUE SHIELD | Admitting: Physical Therapy

## 2020-01-09 ENCOUNTER — Ambulatory Visit: Payer: BLUE CROSS/BLUE SHIELD | Attending: Hematology

## 2020-01-09 ENCOUNTER — Other Ambulatory Visit: Payer: Self-pay

## 2020-01-09 DIAGNOSIS — R2689 Other abnormalities of gait and mobility: Secondary | ICD-10-CM

## 2020-01-09 DIAGNOSIS — M6281 Muscle weakness (generalized): Secondary | ICD-10-CM

## 2020-01-09 DIAGNOSIS — R1310 Dysphagia, unspecified: Secondary | ICD-10-CM | POA: Diagnosis present

## 2020-01-09 NOTE — Patient Instructions (Addendum)
Hamstring Stretch    Inhale and straighten spine. Exhale and lean forward toward extended leg. Hold position for _20-30__ seconds. Inhale and come back to center. Repeat with other leg extended. Repeat _2-3__ times, alternating legs. Do _2-3__ times per day.   Piriformis Stretch, Sitting    Sit, one ankle on opposite knee, same-side hand on crossed knee. Push down on knee, keeping spine straight. Lean torso forward, with flat back, until tension is felt in hamstrings and gluteals of crossed-leg side. Hold _20-30__ seconds.  Repeat _2-3__ times per session. Do _2-3__ sessions per day.   Ankle Eversion: Long-Sitting    Cancer Rehab [id number]    Loop tubing around feet just below toes, legs separated as far as tolerated, toes pointed inward. Rotate ankles, pointing toes outward. Repeat with other leg. Repeat _5-10_ times per set. Do _1_ sets per session. Do _3-4_ sessions per week.  Ankle Inversion: Long-Sitting (Single Leg)    Tubing around forefoot, on same side as anchor, rotate ankle, pointing toes inward. Repeat _5-10_ times per set. Repeat with other leg. Do _1_ sets per session. Do _3-4_ sessions per week.  Ankle Dorsiflexion: Long-Sitting (Single Leg)    Sit facing anchor, tubing around forefoot, pull toes back toward head. Repeat _5-10_ times per set. Repeat with other leg. Do _1_ sets per session. Do _3-4_ sessions per week.    With exercise band around arch of involved foot, bend foot and toes toward floor against the band's resistance. Repeat with other leg. Repeat _5-10__ times. Do _1__ times per day. Do _3-4_ sessions per week.   Heel Raise: Bilateral (Standing)    Stand near counter for fingertip support if needed. Rise on balls of feet. Repeat __10-20__ times per set. Do _1-2___ sets per session. Do __2__ sessions per day.  SINGLE LIMB STANCE    Stand at counter (corner if you have one) for minimal arm support. Raise leg. Hold _10-20__ seconds. Repeat  with other leg. Once this becomes easier, for increased challenge, close eyes with fingertips on counter for safety. _3-5__ reps per set, _2-3__ sets per day.  Tandem Stance    Stand at counter (corner if you have one). Right foot in front of left, heel touching toe both feet "straight ahead". Stand on Foot Triangle of Support with both feet. Balance in this position _10-20__ seconds. Do with left foot in front of right. Once this becomes easier, for increased challenge, close eyes with fingertips on counter for safety.  Step-Up: Forward    Move step-stool to counter or hold rail if at steps, but as little as able. Step up forward keeping opposite foot off step. Keep pelvis level and back straight. Hold the single leg stand for 3 seconds, then come back down slowly.  Do _10__ times, do _1-2_ sets, on each leg, _1-2__ times per day.    WALKING  Walking is a great form of exercise to increase your strength, endurance and overall fitness.  A walking program can help you start slowly and gradually build endurance as you go.  Everyone's ability is different, so each person's starting point will be different.  You do not have to follow them exactly.  The are just samples. You should simply find out what's right for you and stick to that program.   In the beginning, you'll start off walking 2-3 times a day for short distances.  As you get stronger, you'll be walking further at just 1-2 times per day.  A. You Can Walk  For A Certain Length Of Time Each Day    Walk 5 minutes 3 times per day.  Increase 2 minutes every 2 days (3 times per day).  Work up to 25-30 minutes (1-2 times per day).   Example:   Day 1-2 5 minutes 3 times per day   Day 7-8 12 minutes 2-3 times per day   Day 13-14 25 minutes 1-2 times per day  B. You Can Walk For a Certain Distance Each Day     Distance can be substituted for time.    Example:   3 trips to mailbox (at road)   3 trips to corner of block   3 trips  around the block  C. Go to local [employer] and use the track.    Walk for distance ____ around track  Or time ____ minutes  D. Walk ____ Jog ____ Run ___  Please only do the exercises that your therapist has initialed and dated

## 2020-01-09 NOTE — Therapy (Signed)
[place], [place], [address] Phone: [phone]   Fax:  [phone]  Physical Therapy Treatment  Patient Details  Name: Brandy [name] MRN: [id number] Date of Birth: 1965-12-15 Referring Provider (PT): Dr. Irene Limbo   Encounter Date: 01/09/2020   PT End of Session - 01/09/20 1256    Visit Number 2    Number of Visits 13    Date for PT Re-Evaluation 02/13/20    PT Start Time 1008    PT Stop Time 1102    PT Time Calculation (min) 54 min    Activity Tolerance Patient tolerated treatment well    Behavior During Therapy [facility] for tasks assessed/performed           Past Medical History:  Diagnosis Date  . Arthritis   . ASD (atrial septal defect)    s/p closure with Amplatzer device 10/05/04 (Dr. Myriam Jacobson, [facility]) 10/05/04  . Cataract   . GERD (gastroesophageal reflux disease)   . Headache   . Heart murmur    no longer heard  . Legally blind in right eye, as defined in [place]   . Lumbar herniated disc   . Sciatica     Past Surgical History:  Procedure Laterality Date  . ABLATION    . BREAST SURGERY Bilateral 2011   Breast Reduction Surgery  . BUNIONECTOMY    . CARDIAC CATHETERIZATION     10/05/04 [facility]): LM < 25%, otherwise normal coronaries. No pulmonary HTN, Mildly enlarged RV. Secundum ASD s/p closure.  Marland Kitchen CARDIAC SURGERY    . CATARACT EXTRACTION    . CLEFT PALATE REPAIR     s/p cleft lip and palate repair  . LUMBAR LAMINECTOMY/DECOMPRESSION MICRODISCECTOMY Left 05/23/2016   Procedure: LEFT L4-L5 LATERAL RECESS DECOMPRESSION WITH CENTRAL AND RIGHT DECOMPRESSION VIA LEFT SIDE;  Surgeon: [name], MD;  Location: [place];  Service: Orthopedics;  Laterality: Left;  . LUMBAR LAMINECTOMY/DECOMPRESSION MICRODISCECTOMY Left 05/23/2016   Procedure: LUMBAR LAMINECTOMY/DECOMPRESSION MICRODISCECTOMY Lumbar five - Sacral One 1 LEVEL;  Surgeon: [name], MD;  Location: [place];  Service:  Orthopedics;  Laterality: Left;  . SHOULDER INJECTION Left 05/23/2016   Procedure: SHOULDER INJECTION;  Surgeon: [name], MD;  Location: [place];  Service: Orthopedics;  Laterality: Left;  band-aid per pa-c  . TRANSTHORACIC ECHOCARDIOGRAM     12/15/05 [facility]): Mild LVH, EF > 71%, grade 1 diastolic dysfunction, Trivial MR/PR/TR.    There were no vitals filed for this visit.   Subjective Assessment - 01/09/20 1011    Subjective I had a really bad migraine yesterday and I had to cancel. I am very eager to learn exercises to help me at home.    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 Taking letrozole.  Significant back history with surgery and failure, neurogenic claudication, spondylosis, hepatic stenosis, COPD?, CKD stage III, Eye removal in 2020 due to pain levels and blindness    Patient Stated Goals I need to exercise, I want to be able to go for a walk and not worry about falling    Currently in Pain? No/denies                             [facility] Adult PT Treatment/Exercise - 01/09/20 0001      Neuro Re-ed    Neuro Re-ed Details  Added to  HEP: Heel raises x10 with cuing for equal weight bear and full ROM; bil SLS, bil tandem stance, and 6" step ups all done at back of bike for +2 HHA and returning therapist demo      Lumbar Exercises: Stretches   Single Knee to Chest Stretch Right;Left;2 reps;10 seconds    Lower Trunk Rotation 2 reps;10 seconds   bil     Lumbar Exercises: Aerobic   Stationary Bike Level 1, x7 mins with therapist monitoring pt throughout      Lumbar Exercises: Supine   Pelvic Tilt 10 reps;5 seconds    Clam 10 reps   with post pelvic tilt   Clam Limitations Pt returned therapist demo    Bent Knee Raise 10 reps   with post pelvic tilt   Bent Knee Raise Limitations Pt returned therapist demo      Ankle Exercises: Seated   Other Seated Ankle Exercises Red  theraband for bil ankle 4 ways with pt returning therapist demo and pt and therapist takiing turns holding band                  PT Education - 01/09/20 1048    Education Details Bil hip flexibility, bil ankle strength, balance activites and supine core stabs    Person(s) Educated Patient    Methods Explanation;Demonstration;Handout    Comprehension Verbalized understanding;Returned demonstration;Need further instruction               PT Long Term Goals - 01/02/20 1055      PT LONG TERM GOAL #1   Title Pt will improve BERG balance scale to at least 50/56 to demonstrate decreased fall risk    Baseline 44    Time 6    Period Weeks    Status New      PT LONG TERM GOAL #2   Title Pt will be ind with LRAD for gait stability    Time 4    Period Weeks    Status New      PT LONG TERM GOAL #3   Title pt will demonstrate correct stair ascent and descent, one foot per step, with hand rail in clinic    Time 6    Period Weeks    Status New      PT LONG TERM GOAL #4   Title Pt will improve bil ankle strength to at least 4/5    Time 6    Period Weeks    Status New      PT LONG TERM GOAL #5   Title Pt will be ind with final HEP    Time 6    Period Weeks    Status New                 Plan - 01/09/20 1300    Clinical Impression Statement First session today of beginning exercises. Pt did well with recumbent bike. Then added bil hip flexibility, balance execises and core stabilizations. She was challenged by all exercises and did well with cuing for correct technique and positioning for all. Added all to HEP today.    Personal Factors and Comorbidities Age;Comorbidity 3+    Comorbidities active metastatic cancer, back history, other chronic medical conditions    Examination-Activity Limitations Stand;Locomotion Level;Stairs    Examination-Participation Restrictions Cleaning;Meal Prep;Community Activity    Stability/Clinical Decision Making Stable/Uncomplicated      Rehab Potential Good    PT Frequency 2x / week    PT Duration 6 weeks  PT Treatment/Interventions ADLs/Self Care Home Management;Stair training;Gait training;Functional mobility training;Therapeutic activities;Therapeutic exercise;Patient/family education;Neuromuscular re-education;Manual techniques    PT Next Visit Plan Review all HEP issued today assessing techique; cont bil LE and core strength and cont balance work    PT Home Exercise Plan heel raises; bil hip flexibility, ankle strength, and core stabs    Consulted and Agree with Plan of Care Patient           Patient will benefit from skilled therapeutic intervention in order to improve the following deficits and impairments:  Abnormal gait, Decreased mobility, Difficulty walking, Decreased strength  Visit Diagnosis: Other abnormalities of gait and mobility  Muscle weakness (generalized)     Problem List Patient Active Problem List   Diagnosis Date Noted  . Spondylosis without myelopathy or radiculopathy, lumbosacral region 12/31/2019  . Chronic low back pain (Bilateral) w/o sciatica 12/31/2019  . Chronic hip pain (3ry area of Pain) (Bilateral) (R>L) 11/27/2019  . Chronic sacroiliac joint pain (Left) 11/27/2019  . Chronic pain syndrome 11/11/2019  . Pharmacologic therapy 11/11/2019  . Disorder of skeletal system 11/11/2019  . Problems influencing health status 11/11/2019  . Abnormal MRI, lumbar spine (08/22/2018) 11/11/2019  . Chronic low back pain (1ry area of Pain) (Bilateral) w/ sciatica (Bilateral) 11/11/2019  . DDD (degenerative disc disease), lumbosacral 11/11/2019  . Grade 1 Anterolisthesis (L2-3, L3-4) 11/11/2019  . Grade 1 Retrolisthesis (L4-5, L5-S1) 11/11/2019  . Lumbar facet hypertrophy (Multilevel) (Bilateral) 11/11/2019  . Lumbar facet syndrome (Multilevel) (Bilateral) (R>L) 11/11/2019  . Chronic lower extremity pain (2ry area of Pain) (Bilateral) (R>L) 11/11/2019  . Neurogenic pain 11/11/2019  .  Chronic musculoskeletal pain 11/11/2019  . Infiltrating ductal carcinoma of breast ([place]) 10/08/2019  . Asthma 09/25/2019  . Diarrhea 09/04/2019  . Hepatic steatosis 09/04/2019  . Sepsis ([place]) 09/04/2019  . Nausea & vomiting 09/04/2019  . Obesity (BMI 30-39.9) 09/04/2019  . GERD (gastroesophageal reflux disease)   . Hyperlipidemia   . CKD (chronic kidney disease), stage III   . Depression   . Malignant hypertension   . Transaminitis   . Lactic acidosis   . Pneumonitis 08/04/2019  . Breast cancer ([place]) 08/04/2019  . Tachycardia   . Ocular proptosis 02/14/2019  . PVD (posterior vitreous detachment), left eye 02/14/2019  . Chorioretinal scar of left eye 02/14/2019  . Vitreous floaters of left eye 02/14/2019  . Bilateral leg weakness 01/24/2019  . Acute pansinusitis 08/02/2017  . Insomnia 08/02/2017  . Low back pain radiating to both legs 08/02/2017  . Migraine 08/02/2017  . Neuropathy 08/02/2017  . 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 Left shoulder tendonitis 05/23/2016    Class: Acute  . 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 Anxiety disorder 01/07/2015    Otelia Limes, PTA 01/09/2020, 2:09 PM  [place], [place], [address] Phone: [phone]   Fax:  [phone]  Name: Brandy [name] MRN: [id number] Date of Birth: [birth year]

## 2020-01-14 ENCOUNTER — Encounter: Payer: BLUE CROSS/BLUE SHIELD | Admitting: Rehabilitation

## 2020-01-14 ENCOUNTER — Ambulatory Visit: Payer: BLUE CROSS/BLUE SHIELD | Admitting: Rehabilitation

## 2020-01-15 ENCOUNTER — Ambulatory Visit: Payer: BLUE CROSS/BLUE SHIELD | Admitting: Physical Therapy

## 2020-01-15 ENCOUNTER — Encounter: Payer: Self-pay | Admitting: Physical Therapy

## 2020-01-15 ENCOUNTER — Other Ambulatory Visit: Payer: Self-pay

## 2020-01-15 DIAGNOSIS — M6281 Muscle weakness (generalized): Secondary | ICD-10-CM

## 2020-01-15 DIAGNOSIS — R2689 Other abnormalities of gait and mobility: Secondary | ICD-10-CM

## 2020-01-15 NOTE — Therapy (Signed)
[place], [place], [address] Phone: [phone]   Fax:  [phone]  Physical Therapy Treatment  Patient Details  Name: Brandy [name] MRN: [id number] Date of Birth: November 29, 1965 Referring Provider (PT): Dr. Irene Limbo   Encounter Date: 01/15/2020   PT End of Session - 01/15/20 1350    Visit Number 3    Number of Visits 13    Date for PT Re-Evaluation 02/13/20    PT Start Time 1300    PT Stop Time 6962    PT Time Calculation (min) 48 min    Activity Tolerance Patient tolerated treatment well    Behavior During Therapy [facility] for tasks assessed/performed           Past Medical History:  Diagnosis Date   Arthritis    ASD (atrial septal defect)    s/p closure with Amplatzer device 10/05/04 (Dr. Myriam Jacobson, [facility]) 10/05/04   Cataract    GERD (gastroesophageal reflux disease)    Headache    Heart murmur    no longer heard   Legally blind in right eye, as defined in [place]    Lumbar herniated disc    Sciatica     Past Surgical History:  Procedure Laterality Date   ABLATION     BREAST SURGERY Bilateral 2011   Breast Reduction Surgery   BUNIONECTOMY     CARDIAC CATHETERIZATION     10/05/04 [facility]): LM < 25%, otherwise normal coronaries. No pulmonary HTN, Mildly enlarged RV. Secundum ASD s/p closure.   CARDIAC SURGERY     CATARACT EXTRACTION     CLEFT PALATE REPAIR     s/p cleft lip and palate repair   LUMBAR LAMINECTOMY/DECOMPRESSION MICRODISCECTOMY Left 05/23/2016   Procedure: LEFT L4-L5 LATERAL RECESS DECOMPRESSION WITH CENTRAL AND RIGHT DECOMPRESSION VIA LEFT SIDE;  Surgeon: [name], MD;  Location: [place];  Service: Orthopedics;  Laterality: Left;   LUMBAR LAMINECTOMY/DECOMPRESSION MICRODISCECTOMY Left 05/23/2016   Procedure: LUMBAR LAMINECTOMY/DECOMPRESSION MICRODISCECTOMY Lumbar five - Sacral One 1 LEVEL;  Surgeon: [name], MD;  Location: [place];  Service:  Orthopedics;  Laterality: Left;   SHOULDER INJECTION Left 05/23/2016   Procedure: SHOULDER INJECTION;  Surgeon: [name], MD;  Location: [place];  Service: Orthopedics;  Laterality: Left;  band-aid per pa-c   TRANSTHORACIC ECHOCARDIOGRAM     12/15/05 [facility]): Mild LVH, EF > 95%, grade 1 diastolic dysfunction, Trivial MR/PR/TR.    There were no vitals filed for this visit.   Subjective Assessment - 01/15/20 1303    Subjective Pt says she goes to [facility] to find out about  her lung issues.  She is working on eating better and wants to exercise to get stronger.    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 Taking letrozole.  Significant back history with surgery and failure, neurogenic claudication, spondylosis, hepatic stenosis, COPD?, CKD stage III, Eye removal in 2020 due to pain levels and blindness    Patient Stated Goals I need to exercise, I want to be able to go for a walk and not worry about falling    Currently in Pain? Yes    Pain Score 6     Pain Location Back    Pain Orientation Mid    Pain Descriptors / Indicators Burning    Pain Type Chronic pain    Pain Radiating Towards not at this time  Pain Onset More than a month ago    Pain Frequency Intermittent    Aggravating Factors  household chores, standing or sitting to long    Pain Relieving Factors rest off her feet    Effect of Pain on Daily Activities limits household activites                             [facility] Adult PT Treatment/Exercise - 01/15/20 0001      Exercises   Exercises Shoulder;Lumbar;Knee/Hip      Lumbar Exercises: Supine   Dead Bug 5 reps    Dead Bug Limitations alternating arm overhead and leg up with bent knee raise     Bridge 5 reps    Other Supine Lumbar Exercises lower trunk rotation       Knee/Hip Exercises: Sidelying   Hip ABduction Strengthening;Right;Left;5 reps     Hip ABduction Limitations cues to for posture and core alignment       Shoulder Exercises: Supine   Protraction AROM;Both;10 reps   with dowel    Horizontal ABduction Strengthening;Right;Left;5 reps;Theraband    Theraband Level (Shoulder Horizontal ABduction) Level 1 (Yellow)    External Rotation Strengthening;Right;Left;5 reps;Theraband    Theraband Level (Shoulder External Rotation) Level 1 (Yellow)    Flexion Strengthening;Right;Left;5 reps;Theraband    Theraband Level (Shoulder Flexion) Level 1 (Yellow)    Diagonals Strengthening;Right;Left;5 reps;Theraband    Theraband Level (Shoulder Diagonals) Level 1 (Yellow)      Shoulder Exercises: Seated   Other Seated Exercises neck and upper thoracic ROM with deep breathing                   PT Education - 01/15/20 1318    Education Details gave pt information about [facility] for classes that she might enjoy    Person(s) Educated Patient    Methods Explanation;Handout    Comprehension Verbalized understanding               PT Long Term Goals - 01/02/20 1055      PT LONG TERM GOAL #1   Title Pt will improve BERG balance scale to at least 50/56 to demonstrate decreased fall risk    Baseline 44    Time 6    Period Weeks    Status New      PT LONG TERM GOAL #2   Title Pt will be ind with LRAD for gait stability    Time 4    Period Weeks    Status New      PT LONG TERM GOAL #3   Title pt will demonstrate correct stair ascent and descent, one foot per step, with hand rail in clinic    Time 6    Period Weeks    Status New      PT LONG TERM GOAL #4   Title Pt will improve bil ankle strength to at least 4/5    Time 6    Period Weeks    Status New      PT LONG TERM GOAL #5   Title Pt will be ind with final HEP    Time 6    Period Weeks    Status New                 Plan - 01/15/20 1350    Clinical Impression Statement Focused on general core , and proximal joint exercises for posterior  body flexibilty and strength.  She enjoyed exercise, performed well with all and felt better at the end of the session. Pt happy to get information about [facility] and stated she would call to get registered for some classes.    Personal Factors and Comorbidities Age;Comorbidity 3+    Comorbidities active metastatic cancer, back history, other chronic medical conditions    Examination-Activity Limitations Stand;Locomotion Level;Stairs    Examination-Participation Restrictions Cleaning;Meal Prep;Community Activity    Stability/Clinical Decision Making Stable/Uncomplicated    Rehab Potential Good    PT Frequency 2x / week    PT Duration 6 weeks    PT Next Visit Plan continue with core and balance strength  i today assessing techique; cont bil LE and core strength and cont balance work    PT Home Exercise Plan heel raises; bil hip flexibility, ankle strength, and core stabs, supine scapular series    Consulted and Agree with Plan of Care Patient           Patient will benefit from skilled therapeutic intervention in order to improve the following deficits and impairments:  Abnormal gait, Decreased mobility, Difficulty walking, Decreased strength  Visit Diagnosis: Other abnormalities of gait and mobility  Muscle weakness (generalized)     Problem List Patient Active Problem List   Diagnosis Date Noted   Spondylosis without myelopathy or radiculopathy, lumbosacral region 12/31/2019   Chronic low back pain (Bilateral) w/o sciatica 12/31/2019   Chronic hip pain (3ry area of Pain) (Bilateral) (R>L) 11/27/2019   Chronic sacroiliac joint pain (Left) 11/27/2019   Chronic pain syndrome 11/11/2019   Pharmacologic therapy 11/11/2019   Disorder of skeletal system 11/11/2019   Problems influencing health status 11/11/2019   Abnormal MRI, lumbar spine (08/22/2018) 11/11/2019   Chronic low back pain (1ry area of Pain) (Bilateral) w/ sciatica (Bilateral) 11/11/2019   DDD  (degenerative disc disease), lumbosacral 11/11/2019   Grade 1 Anterolisthesis (L2-3, L3-4) 11/11/2019   Grade 1 Retrolisthesis (L4-5, L5-S1) 11/11/2019   Lumbar facet hypertrophy (Multilevel) (Bilateral) 11/11/2019   Lumbar facet syndrome (Multilevel) (Bilateral) (R>L) 11/11/2019   Chronic lower extremity pain (2ry area of Pain) (Bilateral) (R>L) 11/11/2019   Neurogenic pain 11/11/2019   Chronic musculoskeletal pain 11/11/2019   Infiltrating ductal carcinoma of breast ([place]) 10/08/2019   Asthma 09/25/2019   Diarrhea 09/04/2019   Hepatic steatosis 09/04/2019   Sepsis ([place]) 09/04/2019   Nausea & vomiting 09/04/2019   Obesity (BMI 30-39.9) 09/04/2019   GERD (gastroesophageal reflux disease)    Hyperlipidemia    CKD (chronic kidney disease), stage III    Depression    Malignant hypertension    Transaminitis    Lactic acidosis    Pneumonitis 08/04/2019   Breast cancer ([place]) 08/04/2019   Tachycardia    Ocular proptosis 02/14/2019   PVD (posterior vitreous detachment), left eye 02/14/2019   Chorioretinal scar of left eye 02/14/2019   Vitreous floaters of left eye 02/14/2019   Bilateral leg weakness 01/24/2019   Acute pansinusitis 08/02/2017   Insomnia 08/02/2017   Low back pain radiating to both legs 08/02/2017   Migraine 08/02/2017   Neuropathy 08/02/2017   Spinal stenosis, 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    Class: Chronic   Left shoulder tendonitis 05/23/2016    Class: Acute   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 Owens Shark PT  Norwood Levo 01/15/2020, 1:55 PM  [place], [place], [address] Phone: [phone]  Fax:  [phone]  Name: DAJUANA PALEN MRN: [id number] Date of Birth: 1966-01-25

## 2020-01-15 NOTE — Patient Instructions (Signed)

## 2020-01-16 ENCOUNTER — Ambulatory Visit: Payer: BLUE CROSS/BLUE SHIELD | Attending: Pain Medicine | Admitting: Pain Medicine

## 2020-01-16 ENCOUNTER — Encounter: Payer: Self-pay | Admitting: Pain Medicine

## 2020-01-16 ENCOUNTER — Other Ambulatory Visit: Payer: Self-pay

## 2020-01-16 VITALS — BP 120/80 | HR 91 | Temp 97.2°F | Resp 16 | Ht 67.0 in | Wt 231.0 lb

## 2020-01-16 DIAGNOSIS — M47816 Spondylosis without myelopathy or radiculopathy, lumbar region: Secondary | ICD-10-CM

## 2020-01-16 DIAGNOSIS — M545 Low back pain: Secondary | ICD-10-CM | POA: Diagnosis present

## 2020-01-16 DIAGNOSIS — M4310 Spondylolisthesis, site unspecified: Secondary | ICD-10-CM | POA: Diagnosis present

## 2020-01-16 DIAGNOSIS — G8929 Other chronic pain: Secondary | ICD-10-CM | POA: Insufficient documentation

## 2020-01-16 NOTE — Progress Notes (Signed)
PROVIDER NOTE: Information contained herein reflects review and annotations entered in association with encounter. Interpretation of such information and data should be left to medically-trained personnel. Information provided to patient can be located elsewhere in the medical record under "Patient Instructions". Document created using STT-dictation technology, any transcriptional errors that may result from process are unintentional.    Patient: Brandy [name] [facility]  Service Category: E/M  Provider: Gaspar Cola, MD  DOB: [birth year]  DOS: 01/16/2020  Specialty: Interventional Pain Management  MRN: [id number]  Setting: Ambulatory outpatient  [facility]P: [facility]  Type: Established Patient    Referring Provider: [facility]  Location: Office  Delivery: Face-to-face     HPI  Reason for encounter: [name], a 54 y.o. year old female, is here today for evaluation and management of her Lumbar facet joint syndrome [M47.816]. [name] primary complain today is Back Pain (lumbar bilateral ) Last encounter: Practice (01/01/2020). My last encounter with her was on 12/31/2019. Pertinent problems: 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, L5-S1); Low back pain radiating to both legs; Migraine; Neuropathy; Breast cancer ([place]); Infiltrating ductal carcinoma of breast ([place]); Bilateral leg weakness; Chronic pain syndrome; Abnormal MRI, lumbar spine (08/22/2018); Chronic low back pain (1ry area of Pain) (Bilateral) w/ sciatica (Bilateral); DDD (degenerative disc disease), lumbosacral; Grade 1 Anterolisthesis (L2-3, L3-4); Grade 1 Retrolisthesis (L4-5, L5-S1); Lumbar facet hypertrophy (Multilevel) (Bilateral); Lumbar facet syndrome (Multilevel) (Bilateral) (R>L); Chronic lower extremity pain (2ry area of Pain) (Bilateral) (R>L); Neurogenic pain; Chronic musculoskeletal  pain; Chronic hip pain (3ry area of Pain) (Bilateral) (R>L); Chronic sacroiliac joint pain (Left); Spondylosis without myelopathy or radiculopathy, lumbosacral region; and Chronic low back pain (Bilateral) w/o sciatica on their pertinent problem list. Pain Assessment: Severity of Chronic pain is reported as a 8 /10. Location: Back Lower, Left, Right/into both hips. Onset: More than a month ago. Quality: Discomfort, Constant, Burning (piercing). Timing: Constant. Modifying factor(s): nothing currently. Vitals:  height is _0  (1.702 m) and weight is 231 lb (104.8 kg). Her temporal temperature is 97.2 F (36.2 C) (abnormal). Her blood pressure is 120/80 and her pulse is 91. Her respiration is 16 and oxygen saturation is 100%.   The patient returns to the clinic today for postprocedure evaluation.  Upon reviewing her results, it is clear that she did experience 100% relief of the pain for the duration of the local anesthetic which actually extend that further than the 8 hours that the local anesthetic was supposed to last.  Today we have interpreted these results with the patient and we have decided to proceed with a second diagnostic injection.  If during the second diagnostic injection the patient again gets the same type of relief with the local anesthetic, then it were again confirmed that the etiology of the pain is the facet joint.  The one thing that we will be paying attention to is the duration of benefit.  If the second injection provides her with longer lasting benefit, compared to the first 1, it would suggest that a series of this injections may prove to be beneficial in reducing the patient's pain.  However, I do not have a whole lot of hope on this since imaging does show evidence of facet arthropathy, highly suggestive of mechanical problems sustaining the patient's pain.  If that is the case, the next step would be radiofrequency ablation.  Post-Procedure Evaluation  Procedure (12/31/2019):  Diagnostic bilateral lumbar facet block #1 under fluoroscopic guidance and IV sedation Pre-procedure pain level: 10/10 Post-procedure: 0/10 (100% relief)  Sedation: Sedation provided.  Effectiveness during initial hour after procedure(Ultra-Short Term Relief): 100 %.  Local anesthetic used: Long-acting (4-6 hours) Effectiveness: Defined as any analgesic benefit obtained secondary to the administration of local anesthetics. This carries significant diagnostic value as to the etiological location, or anatomical origin, of the pain. Duration of benefit is expected to coincide with the duration of the local anesthetic used.  Effectiveness during initial 4-6 hours after procedure(Short-Term Relief): 100 %.  Long-term benefit: Defined as any relief past the pharmacologic duration of the local anesthetics.  Effectiveness past the initial 6 hours after procedure(Long-Term Relief): 100 % (pain began returning on the 3rd day and now is back to full severity.).  Current benefits: Defined as benefit that persist at this time.   Analgesia:  Back to baseline Function: Somewhat improved ROM: Somewhat improved  Pharmacotherapy Assessment   Analgesic: None Highest recorded MME/day: 45 mg/day MME/day: 0 mg/day   Monitoring: [place] PMP: PDMP reviewed during this encounter.       Pharmacotherapy: No side-effects or adverse reactions reported. Compliance: No problems identified. Effectiveness: Clinically acceptable.  No notes on file  UDS:  Summary  Date Value Ref Range Status  11/27/2019 Note  Final    Comment:    ==================================================================== Compliance Drug Analysis, Ur ==================================================================== Test                             Result       Flag       Units  Drug Present and Declared for Prescription Verification   Alprazolam                     18           EXPECTED   ng/mg creat   Alpha-hydroxyalprazolam        80            EXPECTED   ng/mg creat    Source of alprazolam is a scheduled prescription medication. Alpha-    hydroxyalprazolam is an expected metabolite of alprazolam.    Hydrocodone                    636          EXPECTED   ng/mg creat   Hydromorphone                  264          EXPECTED   ng/mg creat   Dihydrocodeine                 135          EXPECTED   ng/mg creat   Norhydrocodone                 416          EXPECTED   ng/mg creat    Sources of hydrocodone include scheduled prescription medications.    Hydromorphone, dihydrocodeine and norhydrocodone are expected    metabolites of hydrocodone. Hydromorphone and dihydrocodeine are    also available as scheduled prescription medications.    Amitriptyline                  PRESENT      EXPECTED   Nortriptyline  PRESENT      EXPECTED    Nortriptyline is an expected metabolite of amitriptyline.    Acetaminophen                  PRESENT      EXPECTED   Guaifenesin                    PRESENT      EXPECTED    Guaifenesin may be administered as an over-the-counter or    prescription drug; it may also be present as a breakdown product of    methocarbamol.  Drug Absent but Declared for Prescription Verification   Paroxetine                     Not Detected UNEXPECTED   Dextromethorphan               Not Detected UNEXPECTED ==================================================================== Test                      Result    Flag   Units      Ref Range   Creatinine              197              mg/dL      >=20 ==================================================================== Declared Medications:  The flagging and interpretation on this report are based on the  following declared medications.  Unexpected results may arise from  inaccuracies in the declared medications.   **Note: The testing scope of this panel includes these medications:   Alprazolam  Amitriptyline  Dextromethorphan  Guaifenesin  Hydrocodone   Paroxetine   **Note: The testing scope of this panel does not include small to  moderate amounts of these reported medications:   Acetaminophen   **Note: The testing scope of this panel does not include the  following reported medications:   Abemaciclib (Verzenio)  Albuterol  Atorvastatin  Benzonatate (Tessalon)  Cyanocobalamin  Eye Drop  Fluticasone (Flovent HFA)  Iron  Letrozole  Magnesium  Omeprazole  Ondansetron  Vitamin D2 ==================================================================== For clinical consultation, please call [phone]. ====================================================================      ROS  Constitutional: Denies any fever or chills Gastrointestinal: No reported hemesis, hematochezia, vomiting, or acute GI distress Musculoskeletal: Denies any acute onset joint swelling, redness, loss of ROM, or weakness Neurological: No reported episodes of acute onset apraxia, aphasia, dysarthria, agnosia, amnesia, paralysis, loss of coordination, or loss of consciousness  Medication Review  ALPRAZolam, Magnesium, Vitamin D (Ergocalciferol), abemaciclib, albuterol, amitriptyline, benzonatate, fluticasone, guaiFENesin, letrozole, omeprazole, ondansetron, and prednisoLONE acetate  History Review  Allergy: [name] is allergic to zithromax [azithromycin] and psyllium. Drug: [name]  reports no history of drug use. Alcohol:  reports no history of alcohol use. Tobacco:  reports that she quit smoking about a year ago. Her smoking use included cigarettes. She smoked 0.50 packs per day. She has never used smokeless tobacco. Social: [name]  reports that she quit smoking about a year ago. Her smoking use included cigarettes. She smoked 0.50 packs per day. She has never used smokeless tobacco. She reports that she does not drink alcohol and does not use drugs. Medical:  has a past medical history of Arthritis, ASD (atrial septal defect),  Cataract, GERD (gastroesophageal reflux disease), Headache, Heart murmur, Legally blind in right eye, as defined in [place], Lumbar herniated disc, and Sciatica. Surgical: [name]  has a past surgical history that includes  Breast surgery (Bilateral, 2011); Cataract extraction; Bunionectomy; Cleft palate repair; Cardiac surgery; Ablation; transthoracic echocardiogram; Cardiac catheterization; Lumbar laminectomy/decompression microdiscectomy (Left, 05/23/2016); Lumbar laminectomy/decompression microdiscectomy (Left, 05/23/2016); and Shoulder injection (Left, 05/23/2016). Family: family history includes Cancer in her father; Diabetes in her mother; High blood pressure in her mother.  Laboratory Chemistry Profile   Renal Lab Results  Component Value Date   BUN 13 12/20/2019   CREATININE 1.12 (H) 12/20/2019   GFRAA >60 12/20/2019   GFRNONAA 55 (L) 12/20/2019     Hepatic Lab Results  Component Value Date   AST 61 (H) 12/20/2019   ALT 79 (H) 12/20/2019   ALBUMIN 3.5 12/20/2019   ALKPHOS 149 (H) 12/20/2019   LIPASE 36 09/05/2019     Electrolytes Lab Results  Component Value Date   NA 140 12/20/2019   K 3.7 12/20/2019   CL 104 12/20/2019   CALCIUM 9.3 12/20/2019   MG 1.5 (L) 11/01/2019     Bone Lab Results  Component Value Date   VD25OH 30.51 06/26/2019   25OHVITD1 50 11/27/2019   25OHVITD2 46 11/27/2019   25OHVITD3 4.1 11/27/2019     Inflammation (CRP: Acute Phase) (ESR: Chronic Phase) Lab Results  Component Value Date   CRP 16 (H) 11/27/2019   ESRSEDRATE 22 11/27/2019   LATICACIDVEN 2.1 ([place]) 11/01/2019       Note: Above Lab results reviewed.  Recent Imaging Review  DG PAIN CLINIC C-ARM 1-60 MIN NO REPORT Fluoro was used, but no Radiologist interpretation will be provided.  Please refer to "NOTES" tab for provider progress note. Note: Reviewed        Physical Exam  General appearance: Well nourished, well developed, and well hydrated. In no apparent acute  distress Mental status: Alert, oriented x 3 (person, place, & time)       Respiratory: No evidence of acute respiratory distress Eyes: PERLA Vitals: BP 120/80 (BP Location: Right Arm, Patient Position: Sitting, Cuff Size: Large)   Pulse 91   Temp (!) 97.2 F (36.2 C) (Temporal)   Resp 16   Ht '5\' 7"'$  (1.702 m)   Wt 231 lb (104.8 kg)   SpO2 100%   BMI 36.18 kg/m  BMI: Estimated body mass index is 36.18 kg/m as calculated from the following:   Height as of this encounter: '5\' 7"'$  (1.702 m).   Weight as of this encounter: 231 lb (104.8 kg). Ideal: Ideal body weight: 61.6 kg (135 lb 12.9 oz) Adjusted ideal body weight: 78.9 kg (173 lb 14.1 oz)  Assessment   Status Diagnosis  Controlled Controlled Controlled 1. Lumbar facet syndrome (Multilevel) (Bilateral) (R>L)   2. Lumbar facet hypertrophy (Multilevel) (Bilateral)   3. Grade 1 Retrolisthesis (L4-5, L5-S1)   4. Grade 1 Anterolisthesis (L2-3, L3-4)   5. Chronic low back pain (Bilateral) w/o sciatica      Updated Problems: No problems updated.  Plan of Care  Problem-specific:  No problem-specific Assessment & Plan notes found for this encounter.  [name] has a current medication list which includes the following long-term medication(s): albuterol, amitriptyline, flovent hfa, omeprazole, and amitriptyline.  Pharmacotherapy (Medications Ordered): No orders of the defined types were placed in this encounter.  Orders:  Orders Placed This Encounter  Procedures  . LUMBAR FACET(MEDIAL BRANCH NERVE BLOCK) MBNB    Standing Status:   Future    Standing Expiration Date:   02/16/2020    Scheduling Instructions:     Procedure: Lumbar facet block (AKA.: Lumbosacral medial branch nerve block)  Side: Bilateral     Level: L3-4, L4-5, & L5-S1 Facets (L2, L3, L4, L5, & S1 Medial Branch Nerves)     Sedation: Patient's choice.     Timeframe: ASAA    Order Specific Question:   Where will this procedure be performed?     Answer:   [facility]   Follow-up plan:   Return for Procedure (w/ sedation): (B) L-FCT BLK #2.      Interventional management options: Planned, scheduled, and/or pending:    Diagnostic bilateral lumbar facet block #1    Considering:   Diagnostic bilateral lumbar facet block  Diagnostic caudal ESI + epidurogram  Diagnostic left sacroiliac joint block  Diagnostic right IA hip joint injection    PRN Procedures:   None at this time     Recent Visits Date Type Provider Dept  12/31/19 Procedure visit Milinda Pointer, MD [facility]  12/25/19 Office Visit Milinda Pointer, MD [facility]  11/27/19 Office Visit Milinda Pointer, MD [facility]  Showing recent visits within past 90 days and meeting all other requirements Today's Visits Date Type Provider Dept  01/16/20 Office Visit Milinda Pointer, MD [facility]  Showing today's visits and meeting all other requirements Future Appointments No visits were found meeting these conditions. Showing future appointments within next 90 days and meeting all other requirements  I discussed the assessment and treatment plan with the patient. The patient was provided an opportunity to ask questions and all were answered. The patient agreed with the plan and demonstrated an understanding of the instructions.  Patient advised to call back or seek an in-person evaluation if the symptoms or condition worsens.  Duration of encounter: 30 minutes.  Note by: Gaspar Cola, MD Date: 01/16/2020; Time: 1:59 PM

## 2020-01-16 NOTE — Patient Instructions (Addendum)
____________________________________________________________________________________________  Preparing for Procedure with Sedation  Procedure appointments are limited to planned procedures: . No Prescription Refills. . No disability issues will be discussed. . No medication changes will be discussed.  Instructions: . Oral Intake: Do not eat or drink anything for at least 8 hours prior to your procedure. (Exception: Blood Pressure Medication. See below.) . Transportation: Unless otherwise stated by your physician, you may drive yourself after the procedure. . Blood Pressure Medicine: Do not forget to take your blood pressure medicine with a sip of water the morning of the procedure. If your Diastolic (lower reading)is above 100 mmHg, elective cases will be cancelled/rescheduled. . Blood thinners: These will need to be stopped for procedures. Notify our staff if you are taking any blood thinners. Depending on which one you take, there will be specific instructions on how and when to stop it. . Diabetics on insulin: Notify the staff so that you can be scheduled 1st case in the morning. If your diabetes requires high dose insulin, take only  of your normal insulin dose the morning of the procedure and notify the staff that you have done so. . Preventing infections: Shower with an antibacterial soap the morning of your procedure. . Build-up your immune system: Take 1000 mg of Vitamin C with every meal (3 times a day) the day prior to your procedure. . Antibiotics: Inform the staff if you have a condition or reason that requires you to take antibiotics before dental procedures. . Pregnancy: If you are pregnant, call and cancel the procedure. . Sickness: If you have a cold, fever, or any active infections, call and cancel the procedure. . Arrival: You must be in the facility at least 30 minutes prior to your scheduled procedure. . Children: Do not bring children with you. . Dress appropriately:  Bring dark clothing that you would not mind if they get stained. . Valuables: Do not bring any jewelry or valuables.  Reasons to call and reschedule or cancel your procedure: (Following these recommendations will minimize the risk of a serious complication.) . Surgeries: Avoid having procedures within 2 weeks of any surgery. (Avoid for 2 weeks before or after any surgery). . Flu Shots: Avoid having procedures within 2 weeks of a flu shots or . (Avoid for 2 weeks before or after immunizations). . Barium: Avoid having a procedure within 7-10 days after having had a radiological study involving the use of radiological contrast. (Myelograms, Barium swallow or enema study). . Heart attacks: Avoid any elective procedures or surgeries for the initial 6 months after a "Myocardial Infarction" (Heart Attack). . Blood thinners: It is imperative that you stop these medications before procedures. Let us know if you if you take any blood thinner.  . Infection: Avoid procedures during or within two weeks of an infection (including chest colds or gastrointestinal problems). Symptoms associated with infections include: Localized redness, fever, chills, night sweats or profuse sweating, burning sensation when voiding, cough, congestion, stuffiness, runny nose, sore throat, diarrhea, nausea, vomiting, cold or Flu symptoms, recent or current infections. It is specially important if the infection is over the area that we intend to treat. . Heart and lung problems: Symptoms that may suggest an active cardiopulmonary problem include: cough, chest pain, breathing difficulties or shortness of breath, dizziness, ankle swelling, uncontrolled high or unusually low blood pressure, and/or palpitations. If you are experiencing any of these symptoms, cancel your procedure and contact your primary care physician for an evaluation.  Remember:  Regular Business hours are:    Monday to Thursday 8:00 AM to 4:00 PM  Provider's  Schedule: Milinda Pointer, MD:  Procedure days: Tuesday and Thursday 7:30 AM to 4:00 PM  Gillis Santa, MD:  Procedure days: Monday and Wednesday 7:30 AM to 4:00 PM ____________________________________________________________________________________________   Turmeric Pills (OTC)  Garlic Powder Arnica Cream

## 2020-01-19 ENCOUNTER — Encounter: Payer: Self-pay | Admitting: Pain Medicine

## 2020-01-20 NOTE — Telephone Encounter (Signed)
The patient is Brandy [name] the first of September so she wants to wait on getting her procedure until then. She wants to know what she can do for her pain in the meantime.

## 2020-01-21 ENCOUNTER — Encounter: Payer: Self-pay | Admitting: Rehabilitation

## 2020-01-21 ENCOUNTER — Ambulatory Visit: Payer: BLUE CROSS/BLUE SHIELD | Admitting: Rehabilitation

## 2020-01-21 ENCOUNTER — Other Ambulatory Visit: Payer: Self-pay

## 2020-01-21 DIAGNOSIS — R2689 Other abnormalities of gait and mobility: Secondary | ICD-10-CM | POA: Diagnosis not present

## 2020-01-21 DIAGNOSIS — M6281 Muscle weakness (generalized): Secondary | ICD-10-CM

## 2020-01-21 NOTE — Therapy (Signed)
[place], [place], [address] Phone: [phone]   Fax:  [phone]  Physical Therapy Treatment  Patient Details  Name: Brandy [name] MRN: [id number] Date of Birth: 1965-11-18 Referring Provider (PT): Dr. Irene Limbo   Encounter Date: 01/21/2020   PT End of Session - 01/21/20 0835    Visit Number 4    Number of Visits 13    Date for PT Re-Evaluation 02/13/20    PT Start Time 0800    PT Stop Time 0830    PT Time Calculation (min) 30 min    Activity Tolerance Patient tolerated treatment well    Behavior During Therapy [facility] for tasks assessed/performed           Past Medical History:  Diagnosis Date  . Arthritis   . ASD (atrial septal defect)    s/p closure with Amplatzer device 10/05/04 (Dr. Myriam Jacobson, [facility]) 10/05/04  . Cataract   . GERD (gastroesophageal reflux disease)   . Headache   . Heart murmur    no longer heard  . Legally blind in right eye, as defined in [place]   . Lumbar herniated disc   . Sciatica     Past Surgical History:  Procedure Laterality Date  . ABLATION    . BREAST SURGERY Bilateral 2011   Breast Reduction Surgery  . BUNIONECTOMY    . CARDIAC CATHETERIZATION     10/05/04 [facility]): LM < 25%, otherwise normal coronaries. No pulmonary HTN, Mildly enlarged RV. Secundum ASD s/p closure.  Marland Kitchen CARDIAC SURGERY    . CATARACT EXTRACTION    . CLEFT PALATE REPAIR     s/p cleft lip and palate repair  . LUMBAR LAMINECTOMY/DECOMPRESSION MICRODISCECTOMY Left 05/23/2016   Procedure: LEFT L4-L5 LATERAL RECESS DECOMPRESSION WITH CENTRAL AND RIGHT DECOMPRESSION VIA LEFT SIDE;  Surgeon: [name], MD;  Location: [place];  Service: Orthopedics;  Laterality: Left;  . LUMBAR LAMINECTOMY/DECOMPRESSION MICRODISCECTOMY Left 05/23/2016   Procedure: LUMBAR LAMINECTOMY/DECOMPRESSION MICRODISCECTOMY Lumbar five - Sacral One 1 LEVEL;  Surgeon: [name], MD;  Location: [place];  Service:  Orthopedics;  Laterality: Left;  . SHOULDER INJECTION Left 05/23/2016   Procedure: SHOULDER INJECTION;  Surgeon: [name], MD;  Location: [place];  Service: Orthopedics;  Laterality: Left;  band-aid per pa-c  . TRANSTHORACIC ECHOCARDIOGRAM     12/15/05 [facility]): Mild LVH, EF > 76%, grade 1 diastolic dysfunction, Trivial MR/PR/TR.    There were no vitals filed for this visit.   Subjective Assessment - 01/21/20 0801    Subjective My sciatica is really bad but the pain doctor can't do anything until my new insurance approves it. I'm not sure if I have my BCBS anymore. I think it will be [employer].  It started Sunday when I was sewing. 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 Taking letrozole.  Significant back history with surgery and failure, neurogenic claudication, spondylosis, hepatic stenosis, COPD?, CKD stage III, Eye removal in 2020 due to pain levels and blindness    Patient Stated Goals I need to exercise, I want to be able to go for a walk and not worry about falling    Currently in Pain? Yes    Pain Score 8     Pain Location Back   and the calves   Pain Orientation Right;Left    Pain Descriptors /  Indicators Aching;Burning    Pain Type Chronic pain    Pain Relieving Factors sitting still; I tried ice and heat, I am going to try my TENS unit                             [facility] Adult PT Treatment/Exercise - 01/21/20 0001      Exercises   Exercises Other Exercises    Other Exercises  reviewed self care for this sciatica flare up; LTR, SKTC if able, APT/PPT, heat/ice and TENS, positioning, gentle ankle pumps      Lumbar Exercises: Stretches   Lower Trunk Rotation 10 seconds      Lumbar Exercises: Seated   Other Seated Lumbar Exercises sciatic nerve glide seated with 6 ankle pumps x 6 bil       Lumbar Exercises: Supine   Ab Set 10 reps;5  seconds    AB Set Limitations with tcs to check activation and vcs to breathe    Other Supine Lumbar Exercises pt unable to do SKTC due to pain                       PT Long Term Goals - 01/02/20 1055      PT LONG TERM GOAL #1   Title Pt will improve BERG balance scale to at least 50/56 to demonstrate decreased fall risk    Baseline 44    Time 6    Period Weeks    Status New      PT LONG TERM GOAL #2   Title Pt will be ind with LRAD for gait stability    Time 4    Period Weeks    Status New      PT LONG TERM GOAL #3   Title pt will demonstrate correct stair ascent and descent, one foot per step, with hand rail in clinic    Time 6    Period Weeks    Status New      PT LONG TERM GOAL #4   Title Pt will improve bil ankle strength to at least 4/5    Time 6    Period Weeks    Status New      PT LONG TERM GOAL #5   Title Pt will be ind with final HEP    Time 6    Period Weeks    Status New                 Plan - 01/21/20 6979    Clinical Impression Statement Pt in high levels of pain today sciatic type in bilateral LEs, buttocks, and calves needing minA for sit to stand and supine to sit today.  Attempted some very gentle lumbar ROM and sciatic stretches which were tolerated but increased nerve irratibility.  Pt was educated on using heat/ice/TENS at home as preferred, continue with these stretches as able, and focus on pain relief until next session.    PT Frequency 2x / week    PT Duration 6 weeks    PT Treatment/Interventions ADLs/Self Care Home Management;Stair training;Gait training;Functional mobility training;Therapeutic activities;Therapeutic exercise;Patient/family education;Neuromuscular re-education;Manual techniques    PT Next Visit Plan how is back? continue with core and balance    PT Home Exercise Plan heel raises; bil hip flexibility, ankle strength, and core stabs, supine scapular series    Consulted and Agree with Plan of Care Patient  Patient will benefit from skilled therapeutic intervention in order to improve the following deficits and impairments:     Visit Diagnosis: Other abnormalities of gait and mobility  Muscle weakness (generalized)     Problem List Patient Active Problem List   Diagnosis Date Noted  . Spondylosis without myelopathy or radiculopathy, lumbosacral region 12/31/2019  . Chronic low back pain (Bilateral) w/o sciatica 12/31/2019  . Chronic hip pain (3ry area of Pain) (Bilateral) (R>L) 11/27/2019  . Chronic sacroiliac joint pain (Left) 11/27/2019  . Chronic pain syndrome 11/11/2019  . Pharmacologic therapy 11/11/2019  . Disorder of skeletal system 11/11/2019  . Problems influencing health status 11/11/2019  . Abnormal MRI, lumbar spine (08/22/2018) 11/11/2019  . Chronic low back pain (1ry area of Pain) (Bilateral) w/ sciatica (Bilateral) 11/11/2019  . DDD (degenerative disc disease), lumbosacral 11/11/2019  . Grade 1 Anterolisthesis (L2-3, L3-4) 11/11/2019  . Grade 1 Retrolisthesis (L4-5, L5-S1) 11/11/2019  . Lumbar facet hypertrophy (Multilevel) (Bilateral) 11/11/2019  . Lumbar facet syndrome (Multilevel) (Bilateral) (R>L) 11/11/2019  . Chronic lower extremity pain (2ry area of Pain) (Bilateral) (R>L) 11/11/2019  . Neurogenic pain 11/11/2019  . Chronic musculoskeletal pain 11/11/2019  . Infiltrating ductal carcinoma of breast ([place]) 10/08/2019  . Asthma 09/25/2019  . Diarrhea 09/04/2019  . Hepatic steatosis 09/04/2019  . Sepsis ([place]) 09/04/2019  . Nausea & vomiting 09/04/2019  . Obesity (BMI 30-39.9) 09/04/2019  . GERD (gastroesophageal reflux disease)   . Hyperlipidemia   . CKD (chronic kidney disease), stage III   . Depression   . Malignant hypertension   . Transaminitis   . Lactic acidosis   . Pneumonitis 08/04/2019  . Breast cancer ([place]) 08/04/2019  . Tachycardia   . Ocular proptosis 02/14/2019  . PVD (posterior vitreous detachment), left eye 02/14/2019  .  Chorioretinal scar of left eye 02/14/2019  . Vitreous floaters of left eye 02/14/2019  . Bilateral leg weakness 01/24/2019  . Acute pansinusitis 08/02/2017  . Insomnia 08/02/2017  . Low back pain radiating to both legs 08/02/2017  . Migraine 08/02/2017  . Neuropathy 08/02/2017  . 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 Left shoulder tendonitis 05/23/2016    Class: Acute  . 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 Anxiety disorder 01/07/2015    Stark Bray 01/21/2020, 8:40 AM  [place], [place], [address] Phone: [phone]   Fax:  [phone]  Name: Brandy [name] MRN: [id number] Date of Birth: 1965/12/31

## 2020-01-23 ENCOUNTER — Encounter: Payer: BLUE CROSS/BLUE SHIELD | Admitting: Rehabilitation

## 2020-01-28 ENCOUNTER — Other Ambulatory Visit: Payer: Self-pay

## 2020-01-28 ENCOUNTER — Encounter: Payer: Self-pay | Admitting: Rehabilitation

## 2020-01-28 ENCOUNTER — Ambulatory Visit: Payer: BLUE CROSS/BLUE SHIELD | Admitting: Rehabilitation

## 2020-01-28 DIAGNOSIS — R2689 Other abnormalities of gait and mobility: Secondary | ICD-10-CM

## 2020-01-28 DIAGNOSIS — M6281 Muscle weakness (generalized): Secondary | ICD-10-CM

## 2020-01-28 DIAGNOSIS — R1310 Dysphagia, unspecified: Secondary | ICD-10-CM

## 2020-01-28 NOTE — Therapy (Signed)
[place], [place], [address] Phone: [phone]   Fax:  [phone]  Physical Therapy Treatment  Patient Details  Name: Brandy [name] MRN: [id number] Date of Birth: 09-22-1965 Referring Provider (PT): Dr. Irene Limbo   Encounter Date: 01/28/2020   PT End of Session - 01/28/20 0933    Visit Number 5    Number of Visits 13    Date for PT Re-Evaluation 02/13/20    PT Start Time 0856    PT Stop Time 0940    PT Time Calculation (min) 44 min    Activity Tolerance Patient tolerated treatment well    Behavior During Therapy [facility] for tasks assessed/performed           Past Medical History:  Diagnosis Date  . Arthritis   . ASD (atrial septal defect)    s/p closure with Amplatzer device 10/05/04 (Dr. Myriam Jacobson, [facility]) 10/05/04  . Cataract   . GERD (gastroesophageal reflux disease)   . Headache   . Heart murmur    no longer heard  . Legally blind in right eye, as defined in [place]   . Lumbar herniated disc   . Sciatica     Past Surgical History:  Procedure Laterality Date  . ABLATION    . BREAST SURGERY Bilateral 2011   Breast Reduction Surgery  . BUNIONECTOMY    . CARDIAC CATHETERIZATION     10/05/04 [facility]): LM < 25%, otherwise normal coronaries. No pulmonary HTN, Mildly enlarged RV. Secundum ASD s/p closure.  Marland Kitchen CARDIAC SURGERY    . CATARACT EXTRACTION    . CLEFT PALATE REPAIR     s/p cleft lip and palate repair  . LUMBAR LAMINECTOMY/DECOMPRESSION MICRODISCECTOMY Left 05/23/2016   Procedure: LEFT L4-L5 LATERAL RECESS DECOMPRESSION WITH CENTRAL AND RIGHT DECOMPRESSION VIA LEFT SIDE;  Surgeon: [name], MD;  Location: [place];  Service: Orthopedics;  Laterality: Left;  . LUMBAR LAMINECTOMY/DECOMPRESSION MICRODISCECTOMY Left 05/23/2016   Procedure: LUMBAR LAMINECTOMY/DECOMPRESSION MICRODISCECTOMY Lumbar five - Sacral One 1 LEVEL;  Surgeon: [name], MD;  Location: [place];  Service:  Orthopedics;  Laterality: Left;  . SHOULDER INJECTION Left 05/23/2016   Procedure: SHOULDER INJECTION;  Surgeon: [name], MD;  Location: [place];  Service: Orthopedics;  Laterality: Left;  band-aid per pa-c  . TRANSTHORACIC ECHOCARDIOGRAM     12/15/05 [facility]): Mild LVH, EF > 48%, grade 1 diastolic dysfunction, Trivial MR/PR/TR.    There were no vitals filed for this visit.   Subjective Assessment - 01/28/20 0857    Subjective I started to calm down on its own. 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 Taking letrozole.  Significant back history with surgery and failure, neurogenic claudication, spondylosis, hepatic stenosis, COPD?, CKD stage III, Eye removal in 2020 due to pain levels and blindness    Currently in Pain? Yes    Pain Score 4     Pain Location Back    Pain Orientation Lower    Pain Descriptors / Indicators Aching;Burning    Pain Type Chronic pain    Pain Onset More than a month ago    Pain Frequency Constant                             [facility] Adult  PT Treatment/Exercise - 01/28/20 0001      Neuro Re-ed    Neuro Re-ed Details  hallway side steps with CGA x 10 steps and heel to toe minA for LOB and use of wall on one side for LOB as well. Difficult for pt to remain upright       Exercises   Exercises Ankle      Knee/Hip Exercises: Standing   Hip Flexion Both;10 reps    Hip Flexion Limitations holding on to chair.  march motion    Hip Abduction Both;10 reps    Abduction Limitations holding onto chair with vcs for performance    Hip Extension Both;10 reps    Extension Limitations very small motion to minimize lumbar extension    Functional Squat 10 reps    Functional Squat Limitations with vcs for performance and correct form     Other Standing Knee Exercises standing on blue foam at back of bike  regular stance x 30sec, Feet together 2x10" CGA and SLS work 2 finger pressure 2x10" CGA      Knee/Hip Exercises: Seated   Long Arc Quad Both;10 reps    Long Arc Quad Weight 2 lbs.    Long [employer] Limitations seated in chair and blue foam to raise height     [facility] [facility] 6"x10      Ankle Exercises: Standing   Heel Raises 15 reps    Toe Raise 15 reps    Other Standing Ankle Exercises all holding onto the back of the chair       Ankle Exercises: Seated   BAPS Level 2    BAPS Limitations F/B, side to side, clockwise and counterclockwise x 12 each with vcs and minA for the Rt foot with full circles and inv/ev                        PT Long Term Goals - 01/02/20 1055      PT LONG TERM GOAL #1   Title Pt will improve BERG balance scale to at least 50/56 to demonstrate decreased fall risk    Baseline 44    Time 6    Period Weeks    Status New      PT LONG TERM GOAL #2   Title Pt will be ind with LRAD for gait stability    Time 4    Period Weeks    Status New      PT LONG TERM GOAL #3   Title pt will demonstrate correct stair ascent and descent, one foot per step, with hand rail in clinic    Time 6    Period Weeks    Status New      PT LONG TERM GOAL #4   Title Pt will improve bil ankle strength to at least 4/5    Time 6    Period Weeks    Status New      PT LONG TERM GOAL #5   Title Pt will be ind with final HEP    Time 6    Period Weeks    Status New                 Plan - 01/28/20 9030    Clinical Impression Statement pt arrives in much less pain and no SPC reporting that the legs no longer hurt, now just the normal LBP.  Pt was able to tolerate all TE and NME today with most difficulty on  the higher level balance activities like SL stance and tandem walking.  Very difficult to remain upright during tandem walking.    PT Frequency 2x / week    PT Duration 6 weeks    PT Treatment/Interventions ADLs/Self Care Home Management;Stair training;Gait  training;Functional mobility training;Therapeutic activities;Therapeutic exercise;Patient/family education;Neuromuscular re-education;Manual techniques    PT Next Visit Plan continue with core and balance, include tandem walking, nustep?           Patient will benefit from skilled therapeutic intervention in order to improve the following deficits and impairments:     Visit Diagnosis: Other abnormalities of gait and mobility  Muscle weakness (generalized)  Dysphagia, unspecified type     Problem List Patient Active Problem List   Diagnosis Date Noted  . Spondylosis without myelopathy or radiculopathy, lumbosacral region 12/31/2019  . Chronic low back pain (Bilateral) w/o sciatica 12/31/2019  . Chronic hip pain (3ry area of Pain) (Bilateral) (R>L) 11/27/2019  . Chronic sacroiliac joint pain (Left) 11/27/2019  . Chronic pain syndrome 11/11/2019  . Pharmacologic therapy 11/11/2019  . Disorder of skeletal system 11/11/2019  . Problems influencing health status 11/11/2019  . Abnormal MRI, lumbar spine (08/22/2018) 11/11/2019  . Chronic low back pain (1ry area of Pain) (Bilateral) w/ sciatica (Bilateral) 11/11/2019  . DDD (degenerative disc disease), lumbosacral 11/11/2019  . Grade 1 Anterolisthesis (L2-3, L3-4) 11/11/2019  . Grade 1 Retrolisthesis (L4-5, L5-S1) 11/11/2019  . Lumbar facet hypertrophy (Multilevel) (Bilateral) 11/11/2019  . Lumbar facet syndrome (Multilevel) (Bilateral) (R>L) 11/11/2019  . Chronic lower extremity pain (2ry area of Pain) (Bilateral) (R>L) 11/11/2019  . Neurogenic pain 11/11/2019  . Chronic musculoskeletal pain 11/11/2019  . Infiltrating ductal carcinoma of breast ([place]) 10/08/2019  . Asthma 09/25/2019  . Diarrhea 09/04/2019  . Hepatic steatosis 09/04/2019  . Sepsis ([place]) 09/04/2019  . Nausea & vomiting 09/04/2019  . Obesity (BMI 30-39.9) 09/04/2019  . GERD (gastroesophageal reflux disease)   . Hyperlipidemia   . CKD (chronic kidney disease),  stage III   . Depression   . Malignant hypertension   . Transaminitis   . Lactic acidosis   . Pneumonitis 08/04/2019  . Breast cancer ([place]) 08/04/2019  . Tachycardia   . Ocular proptosis 02/14/2019  . PVD (posterior vitreous detachment), left eye 02/14/2019  . Chorioretinal scar of left eye 02/14/2019  . Vitreous floaters of left eye 02/14/2019  . Bilateral leg weakness 01/24/2019  . Acute pansinusitis 08/02/2017  . Insomnia 08/02/2017  . Low back pain radiating to both legs 08/02/2017  . Migraine 08/02/2017  . Neuropathy 08/02/2017  . 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 Left shoulder tendonitis 05/23/2016    Class: Acute  . 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 Anxiety disorder 01/07/2015    Stark Bray 01/28/2020, 9:52 AM  [place], [place], [address] Phone: [phone]   Fax:  [phone]  Name: ALICHA RASPBERRY MRN: [id number] Date of Birth: 04/01/1966

## 2020-01-30 ENCOUNTER — Encounter: Payer: Self-pay | Admitting: Hematology

## 2020-01-30 ENCOUNTER — Other Ambulatory Visit: Payer: Self-pay

## 2020-01-30 ENCOUNTER — Ambulatory Visit (INDEPENDENT_AMBULATORY_CARE_PROVIDER_SITE_OTHER): Payer: BLUE CROSS/BLUE SHIELD | Admitting: Pulmonary Disease

## 2020-01-30 ENCOUNTER — Ambulatory Visit: Payer: BLUE CROSS/BLUE SHIELD | Admitting: Rehabilitation

## 2020-01-30 ENCOUNTER — Encounter: Payer: Self-pay | Admitting: Pulmonary Disease

## 2020-01-30 ENCOUNTER — Encounter: Payer: Self-pay | Admitting: Rehabilitation

## 2020-01-30 VITALS — BP 134/72 | HR 94 | Temp 96.5°F | Ht 67.0 in | Wt 228.0 lb

## 2020-01-30 DIAGNOSIS — R0602 Shortness of breath: Secondary | ICD-10-CM

## 2020-01-30 DIAGNOSIS — J984 Other disorders of lung: Secondary | ICD-10-CM

## 2020-01-30 DIAGNOSIS — R1310 Dysphagia, unspecified: Secondary | ICD-10-CM

## 2020-01-30 DIAGNOSIS — R2689 Other abnormalities of gait and mobility: Secondary | ICD-10-CM

## 2020-01-30 DIAGNOSIS — R918 Other nonspecific abnormal finding of lung field: Secondary | ICD-10-CM | POA: Diagnosis not present

## 2020-01-30 DIAGNOSIS — M6281 Muscle weakness (generalized): Secondary | ICD-10-CM

## 2020-01-30 NOTE — Progress Notes (Signed)
Subjective:   PATIENT ID: Brandy [name] GENDER: female DOB: 1966/05/17, MRN: [id number]   HPI  Chief Complaint  Patient presents with  . Consult    shortness of breath with exertion    Reason for Visit: New consult for evaluation of ?COPD  [name] is a 54 year old female former smoker with hx of breast cancer, obesity, chronic diastolic heart failure, chronic pain, anxiety and insomnia who presents for consult for COPD.  She is followed by Oncology for breast cancer, currently Verzenio and letrozole. She was started on Verzenio in Oct 2020. She developed a cough and was prescribed flovent and PRN albuterol in January. Her cough has improved after starting the nebulizer, usually needing it once a month. She is inconsistent with her Flovent use, often using it less than 3 times a week. She has never had a diagnosis of COPD or asthma. She no longer has a cough. She has shortness of breath with moderate to heavy exertion, which she expects. Denies wheezing. Her Verzenio is on hold until Pulmonary evaluation.  Social History: Quit smoking in 2020. 1/4 ppd x 25 years.   I have personally reviewed patient's past medical/family/social history, allergies, current medications.  Past Medical History:  Diagnosis Date  . Arthritis   . ASD (atrial septal defect)    s/p closure with Amplatzer device 10/05/04 (Dr. Myriam Jacobson, [facility]) 10/05/04  . Cataract   . GERD (gastroesophageal reflux disease)   . Headache   . Heart murmur    no longer heard  . Legally blind in right eye, as defined in [place]   . Lumbar herniated disc   . Sciatica      Family History  Problem Relation Age of Onset  . Diabetes Mother   . High blood pressure Mother   . Cancer Father        Lung  . Colon cancer Neg Hx   . Colon polyps Neg Hx   . Esophageal cancer Neg Hx   . Rectal cancer Neg Hx   . Stomach cancer Neg Hx      Social History   Occupational History  . Not on file  Tobacco  Use  . Smoking status: Former Smoker    Packs/day: 0.25    Years: 25.00    Pack years: 6.25    Types: Cigarettes    Quit date: 02/05/2019    Years since quitting: 0.9  . Smokeless tobacco: Never Used  Vaping Use  . Vaping Use: Never used  Substance and Sexual Activity  . Alcohol use: No  . Drug use: No  . Sexual activity: Not on file    Allergies  Allergen Reactions  . Zithromax [Azithromycin] Shortness Of Breath and Itching    TOTAL BODY ITCHING [EVEN SOLES OF FEET] WHEEZING   . Psyllium Nausea And Vomiting     Outpatient Medications Prior to Visit  Medication Sig Dispense Refill  . albuterol (PROVENTIL) (2.5 MG/3ML) 0.083% nebulizer solution Take 2.5 mg by nebulization every 6 (six) hours as needed for wheezing or shortness of breath.    Marland Kitchen albuterol (VENTOLIN HFA) 108 (90 Base) MCG/ACT inhaler Inhale 2 puffs into the lungs every 6 (six) hours as needed for wheezing or shortness of breath. 18 g 5  . ALPRAZolam (XANAX) 1 MG tablet Take 1 mg by mouth daily as needed for anxiety.     Marland Kitchen amitriptyline (ELAVIL) 75 MG tablet     . benzonatate (TESSALON) 100 MG capsule  Take 1 capsule (100 mg total) by mouth 3 (three) times daily as needed for cough. 60 capsule 0  . fluticasone (FLOVENT HFA) 110 MCG/ACT inhaler Inhale 2 puffs into the lungs 2 (two) times daily. 1 Inhaler 5  . guaiFENesin (MUCINEX) 600 MG 12 hr tablet Take 1 tablet (600 mg total) by mouth 2 (two) times daily. 60 tablet 2  . letrozole (FEMARA) 2.5 MG tablet TAKE 1 TABLET BY MOUTH ONCE DAILY (Patient taking differently: Take 2.5 mg by mouth daily. ) 30 tablet 5  . Magnesium 500 MG TABS Take 1 tablet (500 mg total) by mouth in the morning and at bedtime. 60 tablet 1  . omeprazole (PRILOSEC) 10 MG capsule Take 20 mg by mouth daily.     . ondansetron (ZOFRAN) 8 MG tablet Take 1 tablet (8 mg total) by mouth every 8 (eight) hours as needed for nausea or vomiting. 30 tablet 3  . Vitamin D, Ergocalciferol, (DRISDOL) 1.25 MG (50000  UNIT) CAPS capsule TAKE 1 CAPSULE BY MOUTH 1 TIME A WEEK 12 capsule 3  . VERZENIO 100 MG tablet TAKE 1 TABLET (100 MG TOTAL) BY MOUTH 2 (TWO) TIMES DAILY. (Patient not taking: Reported on 01/30/2020) 56 tablet 0  . prednisoLONE acetate (PRED FORTE) 1 % ophthalmic suspension Place 1 drop into the left eye 4 (four) times daily. (Patient not taking: Reported on 01/30/2020)     Facility-Administered Medications Prior to Visit  Medication Dose Route Frequency Provider Last Rate Last Admin  . 0.9 %  sodium chloride infusion  500 mL Intravenous Continuous Nandigam, Kavitha V, MD        Review of Systems  Constitutional: Negative for chills, diaphoresis, fever, malaise/fatigue and weight loss.  HENT: Negative for congestion, ear pain and sore throat.   Respiratory: Positive for shortness of breath. Negative for cough, hemoptysis, sputum production and wheezing.   Cardiovascular: Negative for chest pain, palpitations and leg swelling.  Gastrointestinal: Negative for abdominal pain, heartburn and nausea.  Genitourinary: Negative for frequency.  Musculoskeletal: Negative for joint pain and myalgias.  Skin: Negative for itching and rash.  Neurological: Negative for dizziness, weakness and headaches.  Endo/Heme/Allergies: Does not bruise/bleed easily.  Psychiatric/Behavioral: Negative for depression. The patient is not nervous/anxious.      Objective:   Vitals:   01/30/20 1336  BP: 134/72  Pulse: 94  Temp: (!) 96.5 F (35.8 C)  TempSrc: Other (Comment)  SpO2: 100%  Weight: 228 lb (103.4 kg)  Height: 5\' 7"  (1.702 m)   SpO2: 100 % (room air) O2 Device: None (Room air)  Physical Exam: General: Well-appearing, no acute distress HENT: [place], AT Eyes: EOMI, no scleral icterus, s/p right enucleation  Respiratory: Clear to auscultation bilaterally.  No crackles, wheezing or rales Cardiovascular: RRR, -M/R/G, no JVD GI: BS+, soft, nontender Extremities:-Edema,-tenderness Neuro: AAO x4, CNII-XII  grossly intact Skin: Intact, no rashes or bruising Psych: Normal mood, normal affect  Data Reviewed:  Imaging: CTA 10/30/19 - No pulmonary embolism. RUL 10x19mm, 41mm, unchanged. RLL nodule 64mm new  PFT: 12/18/19 FVC 3.13 (83%) FEV1 2.49 (92%) Ratio 88  TLC 78% DLCO 58% Interpretation: Mild restrictive defect with moderate reduction in gas exchange  Echocardiogram: 09/2019 Normal EF, grade I DD, mild MR.     Assessment & Plan:   Discussion: 54 year old female who presents with shortness of breath and ?COPD  No evidence of COPD on PFTs. However she does have air-trapping on CT. May have a component of asthma though her symptoms  are very mild with only shortness of breath with moderate exertion. She is not currently compliant with therapy. Does have some relief with albuterol.  On verzenio for her breast cancer. Medication is currently on hold for adverse effects. Not clear if this medication is related to her respiratory symptoms. The most common side effect of this medication is cough, which patient reports is resolved. Will defer to Oncology to decide on risks and benefits of resuming medication. I will however continue work-up for her shortness of breath with methacholine challenge.  Shortness of breath --No evidence of COPD on PFTs --Methacholine challenge to rule out asthma --STOP Flovent. Patient reports not really using it --PRN albuterol  Restrictive defect on PFTs --HRCT in November 2021  Pulmonary nodules: RUL 10x42mm, 41mm, unchanged. RLL nodule 49mm new --HRCT in November 2021  Health Maintenance Immunization History  Administered Date(s) Administered  . Influenza Inj Mdck Quad Pf 04/25/2019  . Influenza,inj,quad, With Preservative 04/25/2019  . Moderna SARS-COVID-2 Vaccination 06/29/2019, 08/03/2019  . Pneumococcal Conjugate-13 05/29/2019  . Pneumococcal Polysaccharide-23 07/29/2019   CT Lung Screen - not qualified  Orders Placed This Encounter  Procedures    . CT Chest High Resolution    Standing Status:   Future    Standing Expiration Date:   01/29/2021    Scheduling Instructions:     Please schedule to be done in November 2021    Order Specific Question:   Is patient pregnant?    Answer:   No    Order Specific Question:   Preferred imaging location?    Answer:   [address]    Order Specific Question:   Radiology Contrast Protocol - do NOT remove file path    Answer:   [email or web address]  . Pulmonary function test    Methacholine challenge    Standing Status:   Future    Standing Expiration Date:   01/29/2021    Order Specific Question:   Where should this test be performed?    Answer:   [facility]    Order Specific Question:   Methacholine challenge    Answer:   Yes  No orders of the defined types were placed in this encounter.   Return in about 2 months (around 03/31/2020).  Crow Wing, MD [facility] 01/30/2020 2:18 PM  Office Number [phone]

## 2020-01-30 NOTE — Therapy (Signed)
[place], [place], [address] Phone: [phone]   Fax:  [phone]  Physical Therapy Treatment  Patient Details  Name: Brandy [name] MRN: [id number] Date of Birth: 1965-12-05 Referring Provider (PT): Dr. Irene Limbo   Encounter Date: 01/30/2020   PT End of Session - 01/30/20 1054    Visit Number 6    Number of Visits 13    Date for PT Re-Evaluation 02/13/20    PT Start Time 1005    PT Stop Time 1050    PT Time Calculation (min) 45 min    Activity Tolerance Patient tolerated treatment well    Behavior During Therapy [facility] for tasks assessed/performed           Past Medical History:  Diagnosis Date  . Arthritis   . ASD (atrial septal defect)    s/p closure with Amplatzer device 10/05/04 (Dr. Myriam Jacobson, [facility]) 10/05/04  . Cataract   . GERD (gastroesophageal reflux disease)   . Headache   . Heart murmur    no longer heard  . Legally blind in right eye, as defined in [place]   . Lumbar herniated disc   . Sciatica     Past Surgical History:  Procedure Laterality Date  . ABLATION    . BREAST SURGERY Bilateral 2011   Breast Reduction Surgery  . BUNIONECTOMY    . CARDIAC CATHETERIZATION     10/05/04 [facility]): LM < 25%, otherwise normal coronaries. No pulmonary HTN, Mildly enlarged RV. Secundum ASD s/p closure.  Marland Kitchen CARDIAC SURGERY    . CATARACT EXTRACTION    . CLEFT PALATE REPAIR     s/p cleft lip and palate repair  . LUMBAR LAMINECTOMY/DECOMPRESSION MICRODISCECTOMY Left 05/23/2016   Procedure: LEFT L4-L5 LATERAL RECESS DECOMPRESSION WITH CENTRAL AND RIGHT DECOMPRESSION VIA LEFT SIDE;  Surgeon: [name], MD;  Location: [place];  Service: Orthopedics;  Laterality: Left;  . LUMBAR LAMINECTOMY/DECOMPRESSION MICRODISCECTOMY Left 05/23/2016   Procedure: LUMBAR LAMINECTOMY/DECOMPRESSION MICRODISCECTOMY Lumbar five - Sacral One 1 LEVEL;  Surgeon: [name], MD;  Location: [place];  Service:  Orthopedics;  Laterality: Left;  . SHOULDER INJECTION Left 05/23/2016   Procedure: SHOULDER INJECTION;  Surgeon: [name], MD;  Location: [place];  Service: Orthopedics;  Laterality: Left;  band-aid per pa-c  . TRANSTHORACIC ECHOCARDIOGRAM     12/15/05 [facility]): Mild LVH, EF > 73%, grade 1 diastolic dysfunction, Trivial MR/PR/TR.    There were no vitals filed for this visit.   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 Taking letrozole.  Significant back history with surgery and failure, neurogenic claudication, spondylosis, hepatic stenosis, COPD?, CKD stage III, Eye removal in 2020 due to pain levels and blindness    Patient Stated Goals I need to exercise, I want to be able to go for a walk and not worry about falling    Currently in Pain? No/denies                             [facility] Adult PT Treatment/Exercise - 01/30/20 0001      Neuro Re-ed    Neuro Re-ed Details  on mini trampoline; march x 10 , weight shifts R/L and forward back x 10 each, all with CGA and hold on  bar, in parallel bars tandem walk x 2 lengths, tandem stance bil 2x20" handhold assist as needed on bars, SL stance work on oval foam 2x20" hand hold assist as needed      Knee/Hip Exercises: Aerobic   Nustep level 2 x 59mn UE/LE      Knee/Hip Exercises: Standing   Heel Raises Both;10 reps    Hip Abduction Both;10 reps    Abduction Limitations holding onto chair with vcs for performance    Hip Extension Both;10 reps    Extension Limitations very small motion to minimize lumbar extension    Functional Squat 10 reps    Functional Squat Limitations with vcs for performance and correct form       Ankle Exercises: Seated   BAPS Level 2    BAPS Limitations F/B, side to side, clockwise and counterclockwise x 12 each with no assistance needed  today in front of the mirror improved in general with visual feedback                        PT Long Term Goals - 01/02/20 1055      PT LONG TERM GOAL #1   Title Pt will improve BERG balance scale to at least 50/56 to demonstrate decreased fall risk    Baseline 44    Time 6    Period Weeks    Status New      PT LONG TERM GOAL #2   Title Pt will be ind with LRAD for gait stability    Time 4    Period Weeks    Status New      PT LONG TERM GOAL #3   Title pt will demonstrate correct stair ascent and descent, one foot per step, with hand rail in clinic    Time 6    Period Weeks    Status New      PT LONG TERM GOAL #4   Title Pt will improve bil ankle strength to at least 4/5    Time 6    Period Weeks    Status New      PT LONG TERM GOAL #5   Title Pt will be ind with final HEP    Time 6    Period Weeks    Status New                 Plan - 01/30/20 1055    Clinical Impression Statement Pt with no pain today and able to perform balance activities and BAPs improved today.  Still with LOB in narrow BOS.    PT Frequency 2x / week    PT Duration 6 weeks    PT Treatment/Interventions ADLs/Self Care Home Management;Stair training;Gait training;Functional mobility training;Therapeutic activities;Therapeutic exercise;Patient/family education;Neuromuscular re-education;Manual techniques    PT Next Visit Plan continue with core and balance, include tandem walking, nustep?    PT Home Exercise Plan heel raises; bil hip flexibility, ankle strength, and core stabs, supine scapular series    Consulted and Agree with Plan of Care Patient           Patient will benefit from skilled therapeutic intervention in order to improve the following deficits and impairments:     Visit Diagnosis: Other abnormalities of gait and mobility  Muscle weakness (generalized)  Dysphagia, unspecified type     Problem List Patient Active Problem List   Diagnosis Date Noted   . Spondylosis without myelopathy or radiculopathy, lumbosacral region 12/31/2019  .  Chronic low back pain (Bilateral) w/o sciatica 12/31/2019  . Chronic hip pain (3ry area of Pain) (Bilateral) (R>L) 11/27/2019  . Chronic sacroiliac joint pain (Left) 11/27/2019  . Chronic pain syndrome 11/11/2019  . Pharmacologic therapy 11/11/2019  . Disorder of skeletal system 11/11/2019  . Problems influencing health status 11/11/2019  . Abnormal MRI, lumbar spine (08/22/2018) 11/11/2019  . Chronic low back pain (1ry area of Pain) (Bilateral) w/ sciatica (Bilateral) 11/11/2019  . DDD (degenerative disc disease), lumbosacral 11/11/2019  . Grade 1 Anterolisthesis (L2-3, L3-4) 11/11/2019  . Grade 1 Retrolisthesis (L4-5, L5-S1) 11/11/2019  . Lumbar facet hypertrophy (Multilevel) (Bilateral) 11/11/2019  . Lumbar facet syndrome (Multilevel) (Bilateral) (R>L) 11/11/2019  . Chronic lower extremity pain (2ry area of Pain) (Bilateral) (R>L) 11/11/2019  . Neurogenic pain 11/11/2019  . Chronic musculoskeletal pain 11/11/2019  . Infiltrating ductal carcinoma of breast ([place]) 10/08/2019  . Asthma 09/25/2019  . Diarrhea 09/04/2019  . Hepatic steatosis 09/04/2019  . Sepsis ([place]) 09/04/2019  . Nausea & vomiting 09/04/2019  . Obesity (BMI 30-39.9) 09/04/2019  . GERD (gastroesophageal reflux disease)   . Hyperlipidemia   . CKD (chronic kidney disease), stage III   . Depression   . Malignant hypertension   . Transaminitis   . Lactic acidosis   . Pneumonitis 08/04/2019  . Breast cancer ([place]) 08/04/2019  . Tachycardia   . Ocular proptosis 02/14/2019  . PVD (posterior vitreous detachment), left eye 02/14/2019  . Chorioretinal scar of left eye 02/14/2019  . Vitreous floaters of left eye 02/14/2019  . Bilateral leg weakness 01/24/2019  . Acute pansinusitis 08/02/2017  . Insomnia 08/02/2017  . Low back pain radiating to both legs 08/02/2017  . Migraine 08/02/2017  . Neuropathy 08/02/2017  . Spinal stenosis,  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    Class: Chronic  . Left shoulder tendonitis 05/23/2016    Class: Acute  . 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 Anxiety disorder 01/07/2015    Stark Bray 01/30/2020, 10:57 AM  [place], [place], [address] Phone: [phone]   Fax:  [phone]  Name: Brandy [name] MRN: [id number] Date of Birth: 1965-09-04

## 2020-01-30 NOTE — Patient Instructions (Addendum)
No evidence of COPD on PFTs. However she does have air-trapping on CT. May have a component of asthma though her symptoms are very mild with only shortness of breath with moderate exertion. Does not regularly use inhalers. Does have some relief with albuterol.  On verzenio for her breast cancer. Medication is currently on hold for adverse effects. Not clear if this medication is related to her respiratory symptoms. The most common side effect of this medication is cough, which patient reports is resolved. Will defer to Oncology to decide on risks and benefits of resuming medication. I will however continue work-up for her shortness of breath with methacholine challenge.  Shortness of breath --No evidence of COPD on PFTs --Methacholine challenge to rule out asthma --STOP Flovent. Patient reports not really using it --PRN albuterol  Restrictive defect on PFTs --HRCT in November 2021  Pulmonary nodules: RUL 10x68mm, 75mm, unchanged. RLL nodule 17mm new --HRCT in November 2021  Follow-up in 2 months with me

## 2020-02-03 ENCOUNTER — Encounter: Payer: Self-pay | Admitting: Hematology

## 2020-02-04 ENCOUNTER — Other Ambulatory Visit: Payer: Self-pay

## 2020-02-04 ENCOUNTER — Ambulatory Visit: Payer: BLUE CROSS/BLUE SHIELD | Admitting: Rehabilitation

## 2020-02-04 ENCOUNTER — Encounter: Payer: Self-pay | Admitting: Rehabilitation

## 2020-02-04 DIAGNOSIS — M6281 Muscle weakness (generalized): Secondary | ICD-10-CM

## 2020-02-04 DIAGNOSIS — R2689 Other abnormalities of gait and mobility: Secondary | ICD-10-CM | POA: Diagnosis not present

## 2020-02-04 DIAGNOSIS — R1310 Dysphagia, unspecified: Secondary | ICD-10-CM

## 2020-02-04 NOTE — Therapy (Signed)
[place], [place], [address] Phone: [phone]   Fax:  [phone]  Physical Therapy Treatment  Patient Details  Name: Brandy [name] MRN: [id number] Date of Birth: 29-Jun-1965 Referring Provider (PT): Dr. Irene Limbo   Encounter Date: 02/04/2020   PT End of Session - 02/04/20 1355    Visit Number 7    Number of Visits 13    Date for PT Re-Evaluation 02/13/20    PT Start Time 1694    PT Stop Time 1355    PT Time Calculation (min) 40 min    Activity Tolerance Patient tolerated treatment well    Behavior During Therapy [facility] for tasks assessed/performed           Past Medical History:  Diagnosis Date  . Arthritis   . ASD (atrial septal defect)    s/p closure with Amplatzer device 10/05/04 (Dr. Myriam Jacobson, [facility]) 10/05/04  . Cataract   . GERD (gastroesophageal reflux disease)   . Headache   . Heart murmur    no longer heard  . Legally blind in right eye, as defined in [place]   . Lumbar herniated disc   . Sciatica     Past Surgical History:  Procedure Laterality Date  . ABLATION    . BREAST SURGERY Bilateral 2011   Breast Reduction Surgery  . BUNIONECTOMY    . CARDIAC CATHETERIZATION     10/05/04 [facility]): LM < 25%, otherwise normal coronaries. No pulmonary HTN, Mildly enlarged RV. Secundum ASD s/p closure.  Marland Kitchen CARDIAC SURGERY    . CATARACT EXTRACTION    . CLEFT PALATE REPAIR     s/p cleft lip and palate repair  . LUMBAR LAMINECTOMY/DECOMPRESSION MICRODISCECTOMY Left 05/23/2016   Procedure: LEFT L4-L5 LATERAL RECESS DECOMPRESSION WITH CENTRAL AND RIGHT DECOMPRESSION VIA LEFT SIDE;  Surgeon: [name], MD;  Location: [place];  Service: Orthopedics;  Laterality: Left;  . LUMBAR LAMINECTOMY/DECOMPRESSION MICRODISCECTOMY Left 05/23/2016   Procedure: LUMBAR LAMINECTOMY/DECOMPRESSION MICRODISCECTOMY Lumbar five - Sacral One 1 LEVEL;  Surgeon: [name], MD;  Location: [place];  Service:  Orthopedics;  Laterality: Left;  . SHOULDER INJECTION Left 05/23/2016   Procedure: SHOULDER INJECTION;  Surgeon: [name], MD;  Location: [place];  Service: Orthopedics;  Laterality: Left;  band-aid per pa-c  . TRANSTHORACIC ECHOCARDIOGRAM     12/15/05 [facility]): Mild LVH, EF > 50%, grade 1 diastolic dysfunction, Trivial MR/PR/TR.    There were no vitals filed for this visit.   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 Taking letrozole.  Significant back history with surgery and failure, neurogenic claudication, spondylosis, hepatic stenosis, COPD?, CKD stage III, Eye removal in 2020 due to pain levels and blindness    Patient Stated Goals I need to exercise, I want to be able to go for a walk and not worry about falling    Currently in Pain? No/denies              [facility] PT Assessment - 02/04/20 0001      Strength   Right Ankle Dorsiflexion 4+/5    Right Ankle Plantar Flexion 4+/5    Right Ankle Inversion 4/5    Right Ankle Eversion 4/5    Left Ankle Dorsiflexion 4+/5    Left Ankle Plantar Flexion  4/5    Left Ankle Inversion 4/5    Left Ankle Eversion 4/5      Berg Balance Test   Sit to Stand Able to stand without using hands and stabilize independently    Standing Unsupported Able to stand safely 2 minutes    Sitting with Back Unsupported but Feet Supported on Floor or Stool Able to sit safely and securely 2 minutes    Stand to Sit Sits safely with minimal use of hands    Transfers Able to transfer safely, minor use of hands    Standing Unsupported with Eyes Closed Able to stand 10 seconds safely    Standing Unsupported with Feet Together Able to place feet together independently and stand 1 minute safely    From Standing, Reach Forward with Outstretched Arm Can reach confidently >25 cm (10")      From Standing Position, Pick up Object from Floor Able to pick up shoe safely and easily    From Standing Position, Turn to Look Behind Over each Shoulder Looks behind from both sides and weight shifts well    Turn 360 Degrees Able to turn 360 degrees safely in 4 seconds or less    Standing Unsupported, Alternately Place Feet on Step/Stool Able to stand independently and safely and complete 8 steps in 20 seconds    Standing Unsupported, One Foot in Front Needs help to step but can hold 15 seconds   able to hold 30seconds but holding on to table to move feet   Standing on One Leg Able to lift leg independently and hold equal to or more than 3 seconds   Left leg to 4", Rt 10" CGA   Total Score 51                         [facility] Adult PT Treatment/Exercise - 02/04/20 0001      High Level Balance   High Level Balance Comments Berg balance assessment x43mn and MMT of bil ankles      Knee/Hip Exercises: Standing   Heel Raises Both;10 reps    Heel Raises Limitations 2# on ankles     Hip Flexion Both;10 reps    Hip Flexion Limitations 2#    Hip Abduction Both;10 reps    Abduction Limitations holding onto counter       Knee/Hip Exercises: Seated   Long Arc Quad Both;10 reps    Long Arc Quad Weight 2 lbs.    Sit to Sand 10 reps;without UE support      Knee/Hip Exercises: Supine   Other Supine Knee/Hip Exercises bolster squeeze 5" x 10     Other Supine Knee/Hip Exercises red band hooklying abduction x 10 red       Ankle Exercises: Seated   Other Seated Ankle Exercises red theraband Rt ankle inv and eversion x 10 each PT holding band                        PT Long Term Goals - 02/04/20 1328      PT LONG TERM GOAL #1   Title Pt will improve BERG balance scale to at least 52/56 to demonstrate decreased fall risk    Baseline 44 baseline, 51 on 8/31, updated on 8/31    Time 6    Period Weeks    Status New      PT LONG TERM GOAL #2   Title Pt will be ind  with LRAD for gait stability    Baseline pt usually needs cane when the back hurts and feels ind with use    Status Achieved      PT LONG TERM GOAL #3   Title pt will demonstrate correct stair ascent and descent, one foot per step, with hand rail in clinic    Baseline unable at baseline, is now able holding on to the rail    Status Achieved      PT LONG TERM GOAL #4   Title Pt will improve bil ankle strength to at least 4/5    Status Achieved      PT LONG TERM GOAL #5   Title Pt will be ind with final HEP    Status On-going                 Plan - 02/04/20 1355    Clinical Impression Statement Goals assessed today as pt has last scheduled visit and new insurance that has yet to be verified but should be no issues with further visits.  Pt has improved her BERG score form 44/56 to /51/56 with improved stability during all stance testing.  Pt has improved ankle strength globally still weak with Rt ankle inversion and eversion.  pt would like to still improve LE strength and balance to feel more confident.    PT Frequency 2x / week    PT Duration 6 weeks    PT Treatment/Interventions ADLs/Self Care Home Management;Stair training;Gait training;Functional mobility training;Therapeutic activities;Therapeutic exercise;Patient/family education;Neuromuscular re-education;Manual techniques    PT Next Visit Plan continue with core and balance, include tandem walking, nustep    PT Home Exercise Plan heel raises; bil hip flexibility, ankle strength, and core stabs, supine scapular series    Consulted and Agree with Plan of Care Patient           Patient will benefit from skilled therapeutic intervention in order to improve the following deficits and impairments:     Visit Diagnosis: Other abnormalities of gait and mobility  Muscle weakness (generalized)  Dysphagia, unspecified type     Problem List Patient Active Problem List   Diagnosis Date Noted  . Spondylosis without  myelopathy or radiculopathy, lumbosacral region 12/31/2019  . Chronic low back pain (Bilateral) w/o sciatica 12/31/2019  . Chronic hip pain (3ry area of Pain) (Bilateral) (R>L) 11/27/2019  . Chronic sacroiliac joint pain (Left) 11/27/2019  . Chronic pain syndrome 11/11/2019  . Pharmacologic therapy 11/11/2019  . Disorder of skeletal system 11/11/2019  . Problems influencing health status 11/11/2019  . Abnormal MRI, lumbar spine (08/22/2018) 11/11/2019  . Chronic low back pain (1ry area of Pain) (Bilateral) w/ sciatica (Bilateral) 11/11/2019  . DDD (degenerative disc disease), lumbosacral 11/11/2019  . Grade 1 Anterolisthesis (L2-3, L3-4) 11/11/2019  . Grade 1 Retrolisthesis (L4-5, L5-S1) 11/11/2019  . Lumbar facet hypertrophy (Multilevel) (Bilateral) 11/11/2019  . Lumbar facet syndrome (Multilevel) (Bilateral) (R>L) 11/11/2019  . Chronic lower extremity pain (2ry area of Pain) (Bilateral) (R>L) 11/11/2019  . Neurogenic pain 11/11/2019  . Chronic musculoskeletal pain 11/11/2019  . Infiltrating ductal carcinoma of breast ([place]) 10/08/2019  . Asthma 09/25/2019  . Diarrhea 09/04/2019  . Hepatic steatosis 09/04/2019  . Sepsis ([place]) 09/04/2019  . Nausea & vomiting 09/04/2019  . Obesity (BMI 30-39.9) 09/04/2019  . GERD (gastroesophageal reflux disease)   . Hyperlipidemia   . CKD (chronic kidney disease), stage III   . Depression   . Malignant hypertension   . Transaminitis   . Lactic  acidosis   . Pneumonitis 08/04/2019  . Breast cancer ([place]) 08/04/2019  . Tachycardia   . Ocular proptosis 02/14/2019  . PVD (posterior vitreous detachment), left eye 02/14/2019  . Chorioretinal scar of left eye 02/14/2019  . Vitreous floaters of left eye 02/14/2019  . Bilateral leg weakness 01/24/2019  . Acute pansinusitis 08/02/2017  . Insomnia 08/02/2017  . Low back pain radiating to both legs 08/02/2017  . Migraine 08/02/2017  . Neuropathy 08/02/2017  . Spinal stenosis, lumbar region with  neurogenic claudication 05/23/2016    Class: Chronic  . Left shoulder tendonitis 05/23/2016    Class: Acute  . 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 Anxiety disorder 01/07/2015    Stark Bray 02/04/2020, 2:00 PM  [place], [place], [address] Phone: [phone]   Fax:  [phone]  Name: Brandy [name] MRN: [id number] Date of Birth: [birth year]

## 2020-02-11 ENCOUNTER — Encounter: Payer: Self-pay | Admitting: Pulmonary Disease

## 2020-02-14 ENCOUNTER — Other Ambulatory Visit (HOSPITAL_COMMUNITY)
Admission: RE | Admit: 2020-02-14 | Discharge: 2020-02-14 | Disposition: A | Discharge status: Home / Self Care | Payer: 59 | Source: Ambulatory Visit | Attending: Pulmonary Disease | Admitting: Pulmonary Disease

## 2020-02-14 DIAGNOSIS — Z20822 Contact with and (suspected) exposure to covid-19: Secondary | ICD-10-CM | POA: Diagnosis not present

## 2020-02-14 DIAGNOSIS — Z01812 Encounter for preprocedural laboratory examination: Secondary | ICD-10-CM | POA: Insufficient documentation

## 2020-02-14 LAB — SARS CORONAVIRUS 2 (TAT 6-24 HRS): SARS Coronavirus 2: NEGATIVE

## 2020-02-17 ENCOUNTER — Other Ambulatory Visit: Payer: Self-pay

## 2020-02-17 ENCOUNTER — Ambulatory Visit (HOSPITAL_COMMUNITY)
Admission: RE | Admit: 2020-02-17 | Discharge: 2020-02-17 | Disposition: A | Discharge status: Home / Self Care | Payer: 59 | Source: Ambulatory Visit | Attending: Pulmonary Disease | Admitting: Pulmonary Disease

## 2020-02-17 ENCOUNTER — Telehealth: Payer: Self-pay

## 2020-02-17 DIAGNOSIS — J984 Other disorders of lung: Secondary | ICD-10-CM | POA: Insufficient documentation

## 2020-02-17 LAB — PULMONARY FUNCTION TEST
FEF 25-75 Post: 3.71 L/sec
FEF 25-75 Pre: 2.93 L/sec
FEF2575-%Change-Post: 26 %
FEF2575-%Pred-Post: 149 %
FEF2575-%Pred-Pre: 118 %
FEV1-%Change-Post: 5 %
FEV1-%Pred-Post: 94 %
FEV1-%Pred-Pre: 89 %
FEV1-Post: 2.35 L
FEV1-Pre: 2.22 L
FEV1FVC-%Change-Post: 7 %
FEV1FVC-%Pred-Pre: 106 %
FEV6-%Change-Post: -1 %
FEV6-%Pred-Post: 84 %
FEV6-%Pred-Pre: 85 %
FEV6-Post: 2.57 L
FEV6-Pre: 2.6 L
FEV6FVC-%Pred-Post: 102 %
FEV6FVC-%Pred-Pre: 102 %
FVC-%Change-Post: -1 %
FVC-%Pred-Post: 81 %
FVC-%Pred-Pre: 83 %
FVC-Post: 2.57 L
FVC-Pre: 2.6 L
Post FEV1/FVC ratio: 92 %
Post FEV6/FVC ratio: 100 %
Pre FEV1/FVC ratio: 85 %
Pre FEV6/FVC Ratio: 100 %

## 2020-02-17 MED ORDER — METHACHOLINE 16 MG/ML NEB SOLN
2.0000 mL | Freq: Once | RESPIRATORY_TRACT | Status: DC
  Filled 2020-02-17: qty 2

## 2020-02-17 MED ORDER — METHACHOLINE 1 MG/ML NEB SOLN
2.0000 mL | Freq: Once | RESPIRATORY_TRACT | Status: DC
  Filled 2020-02-17: qty 2

## 2020-02-17 MED ORDER — SODIUM CHLORIDE 0.9 % IN NEBU
3.0000 mL | INHALATION_SOLUTION | Freq: Once | RESPIRATORY_TRACT | Status: AC
  Administered 2020-02-17: 3 mL via RESPIRATORY_TRACT
  Filled 2020-02-17: qty 3

## 2020-02-17 MED ORDER — METHACHOLINE 0.0625 MG/ML NEB SOLN
2.0000 mL | Freq: Once | RESPIRATORY_TRACT | Status: AC
  Administered 2020-02-17: 0.125 mg via RESPIRATORY_TRACT
  Filled 2020-02-17: qty 3

## 2020-02-17 MED ORDER — METHACHOLINE 0.25 MG/ML NEB SOLN
2.0000 mL | Freq: Once | RESPIRATORY_TRACT | Status: AC
  Administered 2020-02-17: 0.5 mg via RESPIRATORY_TRACT
  Filled 2020-02-17: qty 2

## 2020-02-17 MED ORDER — METHACHOLINE 4 MG/ML NEB SOLN
2.0000 mL | Freq: Once | RESPIRATORY_TRACT | Status: DC
  Filled 2020-02-17: qty 2

## 2020-02-17 MED ORDER — ALBUTEROL SULFATE (2.5 MG/3ML) 0.083% IN NEBU
2.5000 mg | INHALATION_SOLUTION | Freq: Once | RESPIRATORY_TRACT | Status: AC
  Administered 2020-02-17: 2.5 mg via RESPIRATORY_TRACT

## 2020-02-17 NOTE — Telephone Encounter (Signed)
I had called and left a vm last week for her to call to schedule the facets. She called back today, I couldn't get her in until 03/12/20 due to vacation. She wants to know what she can do for the pain until then?

## 2020-02-19 ENCOUNTER — Telehealth: Payer: Self-pay | Admitting: *Deleted

## 2020-02-19 NOTE — Telephone Encounter (Signed)
I have asked Dr. Dossie Arbour and here are her 2 options:   1) toradol/norflex  2) Dr. Holley Raring can do the procedure on 09/27  Called patient to offer these options. No answer. LVM.

## 2020-02-19 NOTE — Telephone Encounter (Signed)
I have sent a message back to Dr. Dossie Arbour asking if patient could have a Toradol/ Norflex injection to help until she can come in for procedure.

## 2020-02-20 NOTE — Telephone Encounter (Signed)
Called patient and she is agreeable to let Dr. Holley Raring do her procedure . Tendra to schedule for 03/02/2020 at 0920. Pre procedure instructions given.

## 2020-02-21 NOTE — Progress Notes (Signed)
HEMATOLOGY/ONCOLOGY CLINIC NOTE  Date of Service: 02/24/2020  Patient Care Team: [facility] as PCP - General Everardo Beals, NP as Nurse Practitioner [facility]  CHIEF COMPLAINTS/PURPOSE OF CONSULTATION:  -f/u for metastatic breast cancer   HISTORY OF PRESENTING ILLNESS:  Brandy [name] is a wonderful 54 y.o. female who has been referred to [place] by [facility]* for evaluation and management of her suspected breast cancer.   The pt reports that she was having increasing low back pain on her right side that would not resolve, so she went to the ED. This pain has not resolved. She notes pain in her hips and knees.   Of note prior to the patient's visit today, pt has had an abdominal limited right upper ultrasound completed on 02/18/2019 with results revealing "Probable hepatic steatosis. No other abnormality seen in the right upper quadrant of the abdomen."  Pt had abdomen and pelvis CT completed on 02/18/2019 with results revealing "5cm spiculated soft tissue mass in inferior right breast, highly suspicious for primary breast carcinoma. No acute findings or metastatic disease within the abdomen or pelvis. Multiple small pulmonary nodules in both lung bases, consistent with pulmonary metastases. 4.5 cm uterine fibroid."  Most recent lab results (02/18/2019) of CBC is as follows: all values are WNL except for glucose at 121, calcium at 8.7, AST at 49, and ALT at 53.  On review of systems, pt reports pedal edema and denies chest pain, shortness of breath, weight loss, and any other symptoms.  Her last known mammography was on 06/18/2012.   On Social Hx the pt reports that she is a smoker and she smokes about half a pack per day. She does not drink alcohol. She has three children: age 46, 65, and 23.  On Family Hx the pt reports lung cancer. Maternal Cousin passed from stomach cancer at the age of 19.    INTERVAL HISTORY:  Brandy [name] is a 54 y.o. female here for evaluation and management of metastatic breast cancer. The patient's last visit with [place] was on 12/20/2019. The pt reports that she is doing well overall.  The pt reports she has been seen by Pulmonology. Her Pulmonologist belives that pt has Bronchitis, but not COPD or Asthma. She has an upcoming CT Chest High Resolution.   Her cough has improved nearly 80% since being off of Verzenio. Pt had SOB since childhood and had an Atrial Septal Defect closure in 2006. Her SOB improved after the procedure, but she feels that it is worsening now. Pt has been experiencing intermittent left-sided chest pain. This pain has been improved with Prilosec in the past.   She has continued Letrozole as prescribed. Pt has been working on improving her diet and also continues complete smoking cessation. Pt received her first Shingles vaccine on 02/18/20. She is scheduled to receive the second in November.   Lab results today (02/24/20) of CBC w/diff and CMP is as follows: all values are WNL except for RBC at 3.74, MCV at 100.5, Glucose at 127, Creatinine at 1.16, Albumin at 3.3, AST at 108, ALT at 174, ALP at 192. 02/24/2020 CA 15-3 is in progress 02/24/2020 CA 27.29 is in progress  On review of systems, pt reports SOB, improved cough, fatigue, intermittent left-sided chest pain and denies unexpected weight loss and any other symptoms.   MEDICAL HISTORY:  Past Medical History:  Diagnosis Date  . Acute pansinusitis 08/02/2017  . Arthritis   .  ASD (atrial septal defect)    s/p closure with Amplatzer device 10/05/04 (Dr. Myriam Jacobson, [facility]) 10/05/04  . Cataract   . GERD (gastroesophageal reflux disease)   . Headache   . Heart murmur    no longer heard  . Legally blind in right eye, as defined in [place]   . Lumbar herniated disc   . Sciatica   Uterine Fibroids  SURGICAL HISTORY: Past Surgical History:  Procedure Laterality Date  . ABLATION    . BREAST SURGERY Bilateral  2011   Breast Reduction Surgery  . BUNIONECTOMY    . CARDIAC CATHETERIZATION     10/05/04 [facility]): LM < 25%, otherwise normal coronaries. No pulmonary HTN, Mildly enlarged RV. Secundum ASD s/p closure.  Marland Kitchen CARDIAC SURGERY    . CATARACT EXTRACTION    . CLEFT PALATE REPAIR     s/p cleft lip and palate repair  . LUMBAR LAMINECTOMY/DECOMPRESSION MICRODISCECTOMY Left 05/23/2016   Procedure: LEFT L4-L5 LATERAL RECESS DECOMPRESSION WITH CENTRAL AND RIGHT DECOMPRESSION VIA LEFT SIDE;  Surgeon: [name], MD;  Location: [place];  Service: Orthopedics;  Laterality: Left;  . LUMBAR LAMINECTOMY/DECOMPRESSION MICRODISCECTOMY Left 05/23/2016   Procedure: LUMBAR LAMINECTOMY/DECOMPRESSION MICRODISCECTOMY Lumbar five - Sacral One 1 LEVEL;  Surgeon: [name], MD;  Location: [place];  Service: Orthopedics;  Laterality: Left;  . SHOULDER INJECTION Left 05/23/2016   Procedure: SHOULDER INJECTION;  Surgeon: [name], MD;  Location: [place];  Service: Orthopedics;  Laterality: Left;  band-aid per pa-c  . TRANSTHORACIC ECHOCARDIOGRAM     12/15/05 [facility]): Mild LVH, EF > 16%, grade 1 diastolic dysfunction, Trivial MR/PR/TR.  Endometrial ablation 2003 Breast Reduction, bilateral 2011  SOCIAL HISTORY: Social History   Socioeconomic History  . Marital status: Married    Spouse name: Not on file  . Number of children: 3  . Years of education: Not on file  . Highest education level: Not on file  Occupational History  . Not on file  Tobacco Use  . Smoking status: Former Smoker    Packs/day: 0.25    Years: 25.00    Pack years: 6.25    Types: Cigarettes    Quit date: 02/05/2019    Years since quitting: 1.0  . Smokeless tobacco: Never Used  Vaping Use  . Vaping Use: Never used  Substance and Sexual Activity  . Alcohol use: No  . Drug use: No  . Sexual activity: Not on file  Other Topics Concern  . Not on file  Social History Narrative  . Not on file   Social Determinants of Health   Financial  Resource Strain:   . Difficulty of Paying Living Expenses: Not on file  Food Insecurity:   . Worried About Charity fundraiser in the Last Year: Not on file  . Ran Out of Food in the Last Year: Not on file  Transportation Needs:   . Lack of Transportation (Medical): Not on file  . Lack of Transportation (Non-Medical): Not on file  Physical Activity:   . Days of Exercise per Week: Not on file  . Minutes of Exercise per Session: Not on file  Stress:   . Feeling of Stress : Not on file  Social Connections:   . Frequency of Communication with Friends and Family: Not on file  . Frequency of Social Gatherings with Friends and Family: Not on file  . Attends Religious Services: Not on file  . Active Member of Clubs or Organizations: Not on file  .  Attends Archivist Meetings: Not on file  . Marital Status: Not on file  Intimate Partner Violence:   . Fear of Current or Ex-Partner: Not on file  . Emotionally Abused: Not on file  . Physically Abused: Not on file  . Sexually Abused: Not on file    FAMILY HISTORY: Family History  Problem Relation Age of Onset  . Diabetes Mother   . High blood pressure Mother   . Cancer Father        Lung  . Colon cancer Neg Hx   . Colon polyps Neg Hx   . Esophageal cancer Neg Hx   . Rectal cancer Neg Hx   . Stomach cancer Neg Hx     ALLERGIES:  is allergic to zithromax [azithromycin] and psyllium.  MEDICATIONS:  Current Outpatient Medications  Medication Sig Dispense Refill  . albuterol (PROVENTIL) (2.5 MG/3ML) 0.083% nebulizer solution Take 2.5 mg by nebulization every 6 (six) hours as needed for wheezing or shortness of breath.    Marland Kitchen albuterol (VENTOLIN HFA) 108 (90 Base) MCG/ACT inhaler Inhale 2 puffs into the lungs every 6 (six) hours as needed for wheezing or shortness of breath. (Patient not taking: Reported on 02/24/2020) 18 g 5  . ALPRAZolam (XANAX) 1 MG tablet Take 1 mg by mouth daily as needed for anxiety.     Marland Kitchen amitriptyline  (ELAVIL) 75 MG tablet     . benzonatate (TESSALON) 100 MG capsule Take 1 capsule (100 mg total) by mouth 3 (three) times daily as needed for cough. 60 capsule 0  . fluticasone (FLOVENT HFA) 110 MCG/ACT inhaler Inhale 2 puffs into the lungs 2 (two) times daily. (Patient not taking: Reported on 02/24/2020) 1 Inhaler 5  . guaiFENesin (MUCINEX) 600 MG 12 hr tablet Take 1 tablet (600 mg total) by mouth 2 (two) times daily. 60 tablet 2  . letrozole (FEMARA) 2.5 MG tablet TAKE 1 TABLET BY MOUTH ONCE DAILY (Patient taking differently: Take 2.5 mg by mouth daily. ) 30 tablet 5  . Magnesium 500 MG TABS Take 1 tablet (500 mg total) by mouth in the morning and at bedtime. 60 tablet 1  . omeprazole (PRILOSEC) 10 MG capsule Take 20 mg by mouth daily.     . ondansetron (ZOFRAN) 8 MG tablet Take 1 tablet (8 mg total) by mouth every 8 (eight) hours as needed for nausea or vomiting. 30 tablet 3  . VERZENIO 100 MG tablet TAKE 1 TABLET (100 MG TOTAL) BY MOUTH 2 (TWO) TIMES DAILY. 56 tablet 0  . Vitamin D, Ergocalciferol, (DRISDOL) 1.25 MG (50000 UNIT) CAPS capsule TAKE 1 CAPSULE BY MOUTH 1 TIME A WEEK 12 capsule 3   Current Facility-Administered Medications  Medication Dose Route Frequency Provider Last Rate Last Admin  . 0.9 %  sodium chloride infusion  500 mL Intravenous Continuous Nandigam, Kavitha V, MD      . influenza vac split quadrivalent PF (FLUARIX) injection 0.5 mL  0.5 mL Intramuscular Once Irene Limbo, Cloria Spring, MD        REVIEW OF SYSTEMS:   A 10+ POINT REVIEW OF SYSTEMS WAS OBTAINED including neurology, dermatology, psychiatry, cardiac, respiratory, lymph, extremities, GI, GU, Musculoskeletal, constitutional, breasts, reproductive, HEENT.  All pertinent positives are noted in the HPI.  All others are negative.   PHYSICAL EXAMINATION: ECOG FS:1 - Symptomatic but completely ambulatory  Vitals:   02/24/20 0948  BP: (!) 144/78  Pulse: (!) 101  Resp: 18  Temp: (!) 95.8 F (35.4 C)  SpO2:  99%   Wt  Readings from Last 3 Encounters:  02/24/20 233 lb 11.2 oz (106 kg)  01/30/20 228 lb (103.4 kg)  01/16/20 231 lb (104.8 kg)   Body mass index is 36.6 kg/m.    GENERAL:alert, in no acute distress and comfortable SKIN: no acute rashes, no significant lesions EYES: conjunctiva are pink and non-injected, sclera anicteric OROPHARYNX: MMM, no exudates, no oropharyngeal erythema or ulceration NECK: supple, no JVD LYMPH:  no palpable lymphadenopathy in the cervical, axillary or inguinal regions LUNGS: clear to auscultation b/l with normal respiratory effort HEART: regular rate & rhythm ABDOMEN:  normoactive bowel sounds , non tender, not distended. No palpable hepatosplenomegaly.  Extremity: no pedal edema PSYCH: alert & oriented x 3 with fluent speech NEURO: no focal motor/sensory deficits  LABORATORY DATA:  I have reviewed the data as listed  . CBC Latest Ref Rng & Units 02/24/2020 12/20/2019 11/01/2019  WBC 4.0 - 10.5 K/uL 6.0 5.1 4.0  Hemoglobin 12.0 - 15.0 g/dL 12.1 11.4(L) 11.1(L)  Hematocrit 36 - 46 % 37.6 35.1(L) 33.9(L)  Platelets 150 - 400 K/uL 297 293 242    . CMP Latest Ref Rng & Units 02/24/2020 12/20/2019 11/01/2019  Glucose 70 - 99 mg/dL 127(H) 160(H) 127(H)  BUN 6 - 20 mg/dL _0 Creatinine 0.44 - 1.00 mg/dL 1.16(H) 1.12(H) 1.35(H)  Sodium 135 - 145 mmol/L 140 140 140  Potassium 3.5 - 5.1 mmol/L 4.1 3.7 3.7  Chloride 98 - 111 mmol/L 102 104 103  CO2 22 - 32 mmol/L 32 24 24  Calcium 8.9 - 10.3 mg/dL 9.2 9.3 9.2  Total Protein 6.5 - 8.1 g/dL 7.3 7.5 7.4  Total Bilirubin 0.3 - 1.2 mg/dL 0.5 0.6 0.5  Alkaline Phos 38 - 126 U/L 192(H) 149(H) 168(H)  AST 15 - 41 U/L 108(H) 61(H) 85(H)  ALT 0 - 44 U/L 174(H) 79(H) 100(H)   Component     Latest Ref Rng & Units 03/08/2019 03/22/2019  FSH     mIU/mL 65.5   LH     mIU/mL 46.6   Estrogen     pg/mL 82   Progesterone     ng/mL <0.1   Estradiol     pg/mL  <5.0        RADIOGRAPHIC STUDIES: I have personally  reviewed the radiological images as listed and agreed with the findings in the report. No results found.  ASSESSMENT & PLAN:   Brandy [name] is a 54 y.o. female with:  1. Metastatic breast cancer ER+/PRneg/Her2 neg  02/28/2019 neck CT with results revealing "negative for mass or adenopathy in the neck."  03/04/2019 head MRI with results revealing "Negative for metastatic disease.  No acute abnormality in the brain."  2. Likely Pulmonary metastases  02/18/2019 chest and abdomen with results revealing "5cm spiculated soft tissue mass in inferior right breast, highly suspicious for primary breast carcinoma. No acute findings or metastatic disease within the abdomen or pelvis. Multiple small pulmonary nodules in both lung bases, consistent with pulmonary metastases. 4.5 cm uterine fibroid."  02/28/2019 C/A/P CT with results revealing "Irregular solid 5.0 cm right breast mass, suspicious for primary right breast malignancy. Innumerable solid pulmonary nodules scattered throughout both lungs, compatible with pulmonary metastases. No evidence of metastatic disease in the abdomen, pelvis or skeleton. Mildly enlarged and probably myomatous uterus. Simple 1.4 cm left adnexal cyst requires no follow-up. This recommendation follows ACR consensus guidelines: White Paper of the ACR Incidental Findings Committee II on Adnexal Findings.  J Am Coll Radiol [phone]. Aortic Atherosclerosis (ICD10-I70.0)."  NUCLEAR MEDICINE WHOLE BODY BONE SCAN completed on 03/19/2019 with results revealing "1. No scintigraphic evidence skeletal metastasis. 2. Degenerative bone disease in the posterior elements of the upper and mid lumbar spine."   04/09/2019 Bone Density ([id number]) which revealed "The BMD measured at Femur Neck Right is 1.153 g/cm2 with a T-score of 0.8. This patient is considered normal according to [facility]) criteria. Lumbar spine was not utilized due to advanced degenerative  changes. The scan quality is good. Femur Neck Right 04/09/2019 54.7 Normal 0.8 1.153 g/cm2. Left Forearm Radius 33% 04/09/2019 54.7 Normal 0.8 0.949 g/cm2."  08/04/2019 CT Angio Chest ([id number]) revealed "1. No lobar or central pulmonary embolus detected. Exam is limited secondary to respiratory motion. 2. Mild ground-glass attenuation may represent mild pneumonitis or areas of air trapping. 3. Signs of atrial septal closure. 4. Decrease in size and number of bilateral pulmonary nodules, marked response noted on today's exam with the only nodule remaining near a cm in the right upper lobe and the smaller nodules that were present on the previous examination throughout the chest no longer measurable though the lower lobes are limited by respiratory motion. 5. Decreased size of right breast mass. 6. Probable hepatic steatosis."  3. Abnormal Liver function test -- previous? Fatty liver 07/03/2019 [place] Abd ([id number]) revealed "1. No acute findings. Normal gallbladder. No bile duct dilation. 2. Significant increased liver parenchymal echogenicity consistent with extensive hepatic steatosis."  PLAN: -Discussed pt labwork today, 02/24/20; blood counts have improved, liver enzymes continue to elevate.  -Discussed 02/24/2020 CA 15-3 & CA 27.29 are in progress. -Discussed CDC guidelines regarding the COVID19 booster. Advised pt that her breast cancer and Verzenio can both impact her immune system. Due to her contact with unvaccinated family would recommend she receive at this time. Pt will seek the Moderna booster.  -Recommend pt receive the annual flu vaccine. Will give in clinic today.  -Pt is up to date with Pneumonia vaccines. Recommend pt f/u as scheduled for the second Shingles vaccine.  -Fatty liver is likely contributing to elevated liver enzymes, but would not account for continued elevation. Letrozole is the most likely cause. Recommend pt discontinue Letrozole. Will switch to Faslodex ASAP. -Will  restart pt on 100 mg Verzenio BID -Recommend pt begin Prilosec BID -Recommend pt f/u for CT Chest High Resolution as scheduled  -Will refer pt back to Dr. Mauri Pole for evaluation of abnormal LFTs -Will see back in 1 month with labs    FOLLOW UP: Flu shot today Plz schedule to start Faslodex ASAP as per orders GI referral to Dr Silverio Decamp for evaluation and Mx of abnormal LFTs RTC with Dr Irene Limbo with labs in 4 weeks   The total time spent in the appt was 30 minutes and more than 50% was on counseling and direct patient cares.  All of the patient's questions were answered with apparent satisfaction. The patient knows to call the clinic with any problems, questions or concerns.  Sullivan Lone MD [place] AAHIVMS [facility] Hematology/Oncology Physician [facility]  (Office):       [phone] (Work cell):  [phone] (Fax):           [phone]  02/24/2020 10:40 AM  I, Yevette Edwards, am acting as a scribe for Dr. Sullivan Lone.   .I have reviewed the above documentation for accuracy and completeness, and I agree with the above. Brunetta Genera MD

## 2020-02-24 ENCOUNTER — Other Ambulatory Visit: Payer: Self-pay

## 2020-02-24 ENCOUNTER — Inpatient Hospital Stay: Payer: 59

## 2020-02-24 ENCOUNTER — Telehealth: Payer: Self-pay | Admitting: Hematology

## 2020-02-24 ENCOUNTER — Inpatient Hospital Stay: Payer: 59 | Attending: Hematology | Admitting: Hematology

## 2020-02-24 VITALS — BP 144/78 | HR 101 | Temp 95.8°F | Resp 18 | Ht 67.0 in | Wt 233.7 lb

## 2020-02-24 DIAGNOSIS — K760 Fatty (change of) liver, not elsewhere classified: Secondary | ICD-10-CM | POA: Insufficient documentation

## 2020-02-24 DIAGNOSIS — C50919 Malignant neoplasm of unspecified site of unspecified female breast: Secondary | ICD-10-CM

## 2020-02-24 DIAGNOSIS — R7989 Other specified abnormal findings of blood chemistry: Secondary | ICD-10-CM

## 2020-02-24 DIAGNOSIS — R5383 Other fatigue: Secondary | ICD-10-CM

## 2020-02-24 DIAGNOSIS — C7802 Secondary malignant neoplasm of left lung: Secondary | ICD-10-CM | POA: Diagnosis present

## 2020-02-24 DIAGNOSIS — Z23 Encounter for immunization: Secondary | ICD-10-CM | POA: Insufficient documentation

## 2020-02-24 DIAGNOSIS — C7801 Secondary malignant neoplasm of right lung: Secondary | ICD-10-CM | POA: Insufficient documentation

## 2020-02-24 DIAGNOSIS — C50911 Malignant neoplasm of unspecified site of right female breast: Secondary | ICD-10-CM | POA: Diagnosis not present

## 2020-02-24 DIAGNOSIS — R945 Abnormal results of liver function studies: Secondary | ICD-10-CM | POA: Diagnosis not present

## 2020-02-24 DIAGNOSIS — Z79899 Other long term (current) drug therapy: Secondary | ICD-10-CM | POA: Diagnosis not present

## 2020-02-24 DIAGNOSIS — I700 Atherosclerosis of aorta: Secondary | ICD-10-CM | POA: Insufficient documentation

## 2020-02-24 LAB — CMP (CANCER CENTER ONLY)
ALT: 174 U/L — ABNORMAL HIGH (ref 0–44)
AST: 108 U/L — ABNORMAL HIGH (ref 15–41)
Albumin: 3.3 g/dL — ABNORMAL LOW (ref 3.5–5.0)
Alkaline Phosphatase: 192 U/L — ABNORMAL HIGH (ref 38–126)
Anion gap: 6 (ref 5–15)
BUN: 13 mg/dL (ref 6–20)
CO2: 32 mmol/L (ref 22–32)
Calcium: 9.2 mg/dL (ref 8.9–10.3)
Chloride: 102 mmol/L (ref 98–111)
Creatinine: 1.16 mg/dL — ABNORMAL HIGH (ref 0.44–1.00)
GFR, Est AFR Am: >60 mL/min (ref >60)
GFR, Estimated: 53 mL/min — ABNORMAL LOW (ref >60)
Glucose, Bld: 127 mg/dL — ABNORMAL HIGH (ref 70–99)
Potassium: 4.1 mmol/L (ref 3.5–5.1)
Sodium: 140 mmol/L (ref 135–145)
Total Bilirubin: 0.5 mg/dL (ref 0.3–1.2)
Total Protein: 7.3 g/dL (ref 6.5–8.1)

## 2020-02-24 LAB — CBC WITH DIFFERENTIAL/PLATELET
Abs Immature Granulocytes: 0.03 10*3/uL (ref 0.00–0.07)
Basophils Absolute: 0 10*3/uL (ref 0.0–0.1)
Basophils Relative: 1 %
Eosinophils Absolute: 0.1 10*3/uL (ref 0.0–0.5)
Eosinophils Relative: 2 %
HCT: 37.6 % (ref 36.0–46.0)
Hemoglobin: 12.1 g/dL (ref 12.0–15.0)
Immature Granulocytes: 1 %
Lymphocytes Relative: 45 %
Lymphs Abs: 2.8 10*3/uL (ref 0.7–4.0)
MCH: 32.4 pg (ref 26.0–34.0)
MCHC: 32.2 g/dL (ref 30.0–36.0)
MCV: 100.5 fL — ABNORMAL HIGH (ref 80.0–100.0)
Monocytes Absolute: 0.3 10*3/uL (ref 0.1–1.0)
Monocytes Relative: 5 %
Neutro Abs: 2.7 10*3/uL (ref 1.7–7.7)
Neutrophils Relative %: 46 %
Platelets: 297 10*3/uL (ref 150–400)
RBC: 3.74 MIL/uL — ABNORMAL LOW (ref 3.87–5.11)
RDW: 13 % (ref 11.5–15.5)
WBC: 6 10*3/uL (ref 4.0–10.5)
nRBC: 0 % (ref 0.0–0.2)

## 2020-02-24 MED ORDER — FULVESTRANT 250 MG/5ML IM SOLN
INTRAMUSCULAR | Status: AC
  Filled 2020-02-24: qty 10

## 2020-02-24 MED ORDER — INFLUENZA VAC SPLIT QUAD 0.5 ML IM SUSY
PREFILLED_SYRINGE | INTRAMUSCULAR | Status: AC
  Filled 2020-02-24: qty 0.5

## 2020-02-24 MED ORDER — FULVESTRANT 250 MG/5ML IM SOLN: 500.0000 mg | Freq: Once | INTRAMUSCULAR | Status: DC

## 2020-02-24 MED ORDER — INFLUENZA VAC SPLIT QUAD 0.5 ML IM SUSY
0.5000 mL | PREFILLED_SYRINGE | Freq: Once | INTRAMUSCULAR | Status: AC
  Administered 2020-02-24: 0.5 mL via INTRAMUSCULAR

## 2020-02-24 NOTE — Telephone Encounter (Signed)
Scheduled appointment per 9/20 los. Spoke with patient in person. Patient is aware of upcoming appointments. Patient declined calendar print out.

## 2020-02-24 NOTE — Progress Notes (Signed)
No Faslodex today, clarification was made that she wouldn't be getting it today. Pharmacy was aware as well. DC order.

## 2020-02-25 ENCOUNTER — Other Ambulatory Visit: Payer: Self-pay | Admitting: Hematology

## 2020-02-25 LAB — CANCER ANTIGEN 15-3: CA 15-3: 26.6 U/mL — ABNORMAL HIGH (ref 0.0–25.0)

## 2020-02-25 LAB — CANCER ANTIGEN 27.29: CA 27.29: 34.2 U/mL (ref 0.0–38.6)

## 2020-02-25 NOTE — Telephone Encounter (Signed)
Please review for refill.  

## 2020-02-27 ENCOUNTER — Telehealth: Payer: Self-pay

## 2020-02-27 NOTE — Telephone Encounter (Signed)
Oral Oncology Patient Advocate Encounter  Received notification from [employer] that prior authorization for Verzenio is required.  PA submitted on CoverMyMeds Key [id number] Status is pending  Oral Oncology Clinic will continue to follow.  [place] Patient [place] Phone [phone] Fax [phone] 02/27/2020 8:45 AM

## 2020-03-02 ENCOUNTER — Ambulatory Visit
Admission: RE | Admit: 2020-03-02 | Discharge: 2020-03-02 | Disposition: A | Discharge status: Home / Self Care | Payer: 59 | Source: Ambulatory Visit | Attending: Student in an Organized Health Care Education/Training Program | Admitting: Student in an Organized Health Care Education/Training Program

## 2020-03-02 ENCOUNTER — Ambulatory Visit (HOSPITAL_BASED_OUTPATIENT_CLINIC_OR_DEPARTMENT_OTHER): Payer: 59 | Admitting: Student in an Organized Health Care Education/Training Program

## 2020-03-02 ENCOUNTER — Other Ambulatory Visit: Payer: Self-pay

## 2020-03-02 ENCOUNTER — Encounter: Payer: Self-pay | Admitting: Student in an Organized Health Care Education/Training Program

## 2020-03-02 ENCOUNTER — Other Ambulatory Visit: Payer: Self-pay | Admitting: Pharmacist

## 2020-03-02 VITALS — BP 138/80 | HR 96 | Temp 97.4°F | Resp 16 | Ht 67.0 in | Wt 233.0 lb

## 2020-03-02 DIAGNOSIS — G894 Chronic pain syndrome: Secondary | ICD-10-CM | POA: Insufficient documentation

## 2020-03-02 DIAGNOSIS — M47816 Spondylosis without myelopathy or radiculopathy, lumbar region: Secondary | ICD-10-CM | POA: Diagnosis present

## 2020-03-02 DIAGNOSIS — M4310 Spondylolisthesis, site unspecified: Secondary | ICD-10-CM

## 2020-03-02 DIAGNOSIS — C50919 Malignant neoplasm of unspecified site of unspecified female breast: Secondary | ICD-10-CM

## 2020-03-02 MED ORDER — LIDOCAINE HCL 2 % IJ SOLN
INTRAMUSCULAR | Status: AC
  Filled 2020-03-02: qty 10

## 2020-03-02 MED ORDER — FENTANYL CITRATE (PF) 100 MCG/2ML IJ SOLN
25.0000 ug | INTRAMUSCULAR | Status: DC | PRN
  Administered 2020-03-02: 100 ug via INTRAVENOUS

## 2020-03-02 MED ORDER — ROPIVACAINE HCL 2 MG/ML IJ SOLN
9.0000 mL | Freq: Once | INTRAMUSCULAR | Status: AC
  Administered 2020-03-02: 10 mL via PERINEURAL

## 2020-03-02 MED ORDER — DEXAMETHASONE SODIUM PHOSPHATE 10 MG/ML IJ SOLN
10.0000 mg | Freq: Once | INTRAMUSCULAR | Status: AC
  Administered 2020-03-02: 10 mg

## 2020-03-02 MED ORDER — ABEMACICLIB 100 MG PO TABS: ORAL_TABLET | ORAL | 0 refills | Status: DC

## 2020-03-02 MED ORDER — LIDOCAINE HCL 2 % IJ SOLN
20.0000 mL | Freq: Once | INTRAMUSCULAR | Status: AC
  Administered 2020-03-02: 200 mg

## 2020-03-02 MED ORDER — TIZANIDINE HCL 4 MG PO TABS: 4.0000 mg | ORAL_TABLET | Freq: Two times a day (BID) | ORAL | 1 refills | Status: AC | PRN

## 2020-03-02 MED ORDER — FENTANYL CITRATE (PF) 100 MCG/2ML IJ SOLN
INTRAMUSCULAR | Status: AC
Start: 2020-03-02 — End: ?
  Filled 2020-03-02: qty 2

## 2020-03-02 MED ORDER — ROPIVACAINE HCL 2 MG/ML IJ SOLN
INTRAMUSCULAR | Status: AC
  Filled 2020-03-02: qty 20

## 2020-03-02 MED ORDER — DEXAMETHASONE SODIUM PHOSPHATE 10 MG/ML IJ SOLN
INTRAMUSCULAR | Status: AC
  Filled 2020-03-02: qty 2

## 2020-03-02 NOTE — Telephone Encounter (Signed)
Oral Oncology Patient Advocate Encounter  Prior Authorization for Melynda Keller has been approved.    PA# [id number] Effective dates: 02/28/20 through 02/27/21  Patient must use [facility] will continue to follow.   [place] Patient [place] Phone [phone] Fax [phone] 03/02/2020 9:10 AM

## 2020-03-02 NOTE — Progress Notes (Signed)
PROVIDER NOTE: Information contained herein reflects review and annotations entered in association with encounter. Interpretation of such information and data should be left to medically-trained personnel. Information provided to patient can be located elsewhere in the medical record under "Patient Instructions". Document created using STT-dictation technology, any transcriptional errors that may result from process are unintentional.    Patient: Brandy [name]  Service Category: Procedure  Provider: Gillis Santa, MD  DOB: 09/25/1965  DOS: 03/02/2020  Location: [facility]  MRN: [id number]  Setting: Ambulatory - outpatient  Referring Provider: [facility]  Type: Established Patient  Specialty: Interventional Pain Management  PCP: [facility]   Primary Reason for Visit: Interventional Pain Management Treatment. CC: Back Pain (lumbar bilateral ) and Leg Pain (bilateral, cramping pain did not release x 2 days.)  Procedure:          Anesthesia, Analgesia, Anxiolysis:  Type: Lumbar Facet, Medial Branch Block(s) #2  Primary Purpose: Therapeutic Region: Posterolateral Lumbosacral Spine Level: L2, L3, L4, L5, Medial Branch Level(s). Injecting these levels blocks the L2-3, L3-4, L4-5, lumbar facet joints. Laterality: Bilateral  Type: Moderate (Conscious) Sedation combined with Local Anesthesia Indication(s): Analgesia and Anxiety Route: Intravenous (IV) IV Access: Secured Sedation: Meaningful verbal contact was maintained at all times during the procedure  Local Anesthetic: Lidocaine 1-2%  Position: Prone   Indications: 1. Spondylosis without myelopathy or radiculopathy, lumbosacral region   2. Lumbar facet syndrome (Multilevel) (Bilateral) (R>L)   3. Lumbar facet hypertrophy (Multilevel) (Bilateral)   4. Grade 1 Anterolisthesis (L2-3, L3-4)   5. Grade 1 Retrolisthesis (L4-5, L5-S1)   6. DDD (degenerative disc disease), lumbosacral   7.  Chronic low back pain (Bilateral) w/o sciatica    Pain Score: Pre-procedure: 4 /10 Post-procedure: 0-No pain/10   Pre-op Assessment:  Brandy [name] is a 54 y.o. (year old), female patient, seen today for interventional treatment. She  has a past surgical history that includes Breast surgery (Bilateral, 2011); Cataract extraction; Bunionectomy; Cleft palate repair; Cardiac surgery; Ablation; transthoracic echocardiogram; Cardiac catheterization; Lumbar laminectomy/decompression microdiscectomy (Left, 05/23/2016); Lumbar laminectomy/decompression microdiscectomy (Left, 05/23/2016); and Shoulder injection (Left, 05/23/2016). Brandy [name] has a current medication list which includes the following prescription(s): abemaciclib, albuterol, alprazolam, amitriptyline, benzonatate, mucinex, magnesium, omeprazole, ondansetron, vitamin d (ergocalciferol), and tizanidine, and the following Facility-Administered Medications: sodium chloride and fentanyl. Her primarily concern today is the Back Pain (lumbar bilateral ) and Leg Pain (bilateral, cramping pain did not release x 2 days.)  Initial Vital Signs:  Pulse/HCG Rate: 96ECG Heart Rate: (!) 101 (st) Temp: (!) 97.3 F (36.3 C) Resp: (!) 93 BP: 117/78 SpO2: 99 %  BMI: Estimated body mass index is 36.49 kg/m as calculated from the following:   Height as of this encounter: 5\' 7"  (1.702 m).   Weight as of this encounter: 233 lb (105.7 kg).  Risk Assessment: Allergies: Reviewed. She is allergic to zithromax [azithromycin] and psyllium.  Allergy Precautions: None required Coagulopathies: Reviewed. None identified.  Blood-thinner therapy: None at this time Active Infection(s): Reviewed. None identified. Brandy [name] is afebrile  Site Confirmation: Brandy [name] was asked to confirm the procedure and laterality before marking the site Procedure checklist: Completed Consent: Before the procedure and under the influence of no sedative(s), amnesic(s),  or anxiolytics, the patient was informed of the treatment options, risks and possible complications. To fulfill our ethical and legal obligations, as recommended by the American Medical Association's Code of Ethics, I have informed the patient of my clinical impression;  the nature and purpose of the treatment or procedure; the risks, benefits, and possible complications of the intervention; the alternatives, including doing nothing; the risk(s) and benefit(s) of the alternative treatment(s) or procedure(s); and the risk(s) and benefit(s) of doing nothing. The patient was provided information about the general risks and possible complications associated with the procedure. These may include, but are not limited to: failure to achieve desired goals, infection, bleeding, organ or nerve damage, allergic reactions, paralysis, and death. In addition, the patient was informed of those risks and complications associated to Spine-related procedures, such as failure to decrease pain; infection (i.e.: Meningitis, epidural or intraspinal abscess); bleeding (i.e.: epidural hematoma, subarachnoid hemorrhage, or any other type of intraspinal or peri-dural bleeding); organ or nerve damage (i.e.: Any type of peripheral nerve, nerve root, or spinal cord injury) with subsequent damage to sensory, motor, and/or autonomic systems, resulting in permanent pain, numbness, and/or weakness of one or several areas of the body; allergic reactions; (i.e.: anaphylactic reaction); and/or death. Furthermore, the patient was informed of those risks and complications associated with the medications. These include, but are not limited to: allergic reactions (i.e.: anaphylactic or anaphylactoid reaction(s)); adrenal axis suppression; blood sugar elevation that in diabetics may result in ketoacidosis or comma; water retention that in patients with history of congestive heart failure may result in shortness of breath, pulmonary edema, and  decompensation with resultant heart failure; weight gain; swelling or edema; medication-induced neural toxicity; particulate matter embolism and blood vessel occlusion with resultant organ, and/or nervous system infarction; and/or aseptic necrosis of one or more joints. Finally, the patient was informed that Medicine is not an exact science; therefore, there is also the possibility of unforeseen or unpredictable risks and/or possible complications that may result in a catastrophic outcome. The patient indicated having understood very clearly. We have given the patient no guarantees and we have made no promises. Enough time was given to the patient to ask questions, all of which were answered to the patient's satisfaction. Brandy [name] has indicated that she wanted to continue with the procedure. Attestation: I, the ordering provider, attest that I have discussed with the patient the benefits, risks, side-effects, alternatives, likelihood of achieving goals, and potential problems during recovery for the procedure that I have provided informed consent. Date  Time: 03/02/2020  9:24 AM  Pre-Procedure Preparation:  Monitoring: As per clinic protocol. Respiration, ETCO2, SpO2, BP, heart rate and rhythm monitor placed and checked for adequate function Safety Precautions: Patient was assessed for positional comfort and pressure points before starting the procedure. Time-out: I initiated and conducted the "Time-out" before starting the procedure, as per protocol. The patient was asked to participate by confirming the accuracy of the "Time Out" information. Verification of the correct person, site, and procedure were performed and confirmed by me, the nursing staff, and the patient. "Time-out" conducted as per Joint Commission's Universal Protocol (UP.01.01.01). Time: 1000  Description of Procedure:          Laterality: Bilateral. The procedure was performed in identical fashion on both sides. Levels:  L2, L3,  L4, L5,Medial Branch Level(s) Area Prepped: Posterior Lumbosacral Region DuraPrep (Iodine Povacrylex [0.7% available iodine] and Isopropyl Alcohol, 74% w/w) Safety Precautions: Aspiration looking for blood return was conducted prior to all injections. At no point did we inject any substances, as a needle was being advanced. Before injecting, the patient was told to immediately notify me if she was experiencing any new onset of "ringing in the ears, or metallic taste in the  mouth". No attempts were made at seeking any paresthesias. Safe injection practices and needle disposal techniques used. Medications properly checked for expiration dates. SDV (single dose vial) medications used. After the completion of the procedure, all disposable equipment used was discarded in the proper designated medical waste containers. Local Anesthesia: Protocol guidelines were followed. The patient was positioned over the fluoroscopy table. The area was prepped in the usual manner. The time-out was completed. The target area was identified using fluoroscopy. A 12-in long, straight, sterile hemostat was used with fluoroscopic guidance to locate the targets for each level blocked. Once located, the skin was marked with an approved surgical skin marker. Once all sites were marked, the skin (epidermis, dermis, and hypodermis), as well as deeper tissues (fat, connective tissue and muscle) were infiltrated with a small amount of a short-acting local anesthetic, loaded on a 10cc syringe with a 25G, 1.5-in  Needle. An appropriate amount of time was allowed for local anesthetics to take effect before proceeding to the next step. Local Anesthetic: Lidocaine 2.0% The unused portion of the local anesthetic was discarded in the proper designated containers. Technical explanation of process:  L2 Medial Branch Nerve Block (MBB): The target area for the L2 medial branch is at the junction of the postero-lateral aspect of the superior articular  process and the superior, posterior, and medial edge of the transverse process of L3. Under fluoroscopic guidance, a Quincke needle was inserted until contact was made with os over the superior postero-lateral aspect of the pedicular shadow (target area). After negative aspiration for blood, 78mL of the nerve block solution was injected without difficulty or complication. The needle was removed intact. L3 Medial Branch Nerve Block (MBB): The target area for the L3 medial branch is at the junction of the postero-lateral aspect of the superior articular process and the superior, posterior, and medial edge of the transverse process of L4. Under fluoroscopic guidance, a Quincke needle was inserted until contact was made with os over the superior postero-lateral aspect of the pedicular shadow (target area). After negative aspiration for blood, 1 mL of the nerve block solution was injected without difficulty or complication. The needle was removed intact. L4 Medial Branch Nerve Block (MBB): The target area for the L4 medial branch is at the junction of the postero-lateral aspect of the superior articular process and the superior, posterior, and medial edge of the transverse process of L5. Under fluoroscopic guidance, a Quincke needle was inserted until contact was made with os over the superior postero-lateral aspect of the pedicular shadow (target area). After negative aspiration for blood, 1 mL of the nerve block solution was injected without difficulty or complication. The needle was removed intact. L5 Medial Branch Nerve Block (MBB): The target area for the L5 medial branch is at the junction of the postero-lateral aspect of the superior articular process and the superior, posterior, and medial edge of the sacral ala. Under fluoroscopic guidance, a Quincke needle was inserted until contact was made with os over the superior postero-lateral aspect of the pedicular shadow (target area). After negative aspiration for  blood, 1 mL of the nerve block solution was injected without difficulty or complication. The needle was removed intact.   Nerve block solution: 10 cc solution made of 8 cc of 0.2% ropivacaine, 2 cc of Decadron 10 mg/cc.  1 to 1.5 cc injected at each level above bilaterally.  The unused portion of the solution was discarded in the proper designated containers. Procedural Needles: 22-gauge, 3.5-inch, Quincke needles  used for all levels.  Once the entire procedure was completed, the treated area was cleaned, making sure to leave some of the prepping solution back to take advantage of its long term bactericidal properties.   Illustration of the posterior view of the lumbar spine and the posterior neural structures. Laminae of L2 through S1 are labeled. DPRL5, dorsal primary ramus of L5; DPRS1, dorsal primary ramus of S1; DPR3, dorsal primary ramus of L3; FJ, facet (zygapophyseal) joint L3-L4; I, inferior articular process of L4; LB1, lateral branch of dorsal primary ramus of L1; IAB, inferior articular branches from L3 medial branch (supplies L4-L5 facet joint); IBP, intermediate branch plexus; MB3, medial branch of dorsal primary ramus of L3; NR3, third lumbar nerve root; S, superior articular process of L5; SAB, superior articular branches from L4 (supplies L4-5 facet joint also); TP3, transverse process of L3.  Vitals:   03/02/20 1015 03/02/20 1023 03/02/20 1033 03/02/20 1043  BP: (!) 149/93 (!) 145/80 140/81 138/80  Pulse:      Resp: 13 15 16 16   Temp:  (!) 97.5 F (36.4 C)  (!) 97.4 F (36.3 C)  TempSrc:      SpO2: 97% 100% 100% 100%  Weight:      Height:         Start Time: 1000 hrs. End Time: 1014 hrs.  Imaging Guidance (Spinal):          Type of Imaging Technique: Fluoroscopy Guidance (Spinal) Indication(s): Assistance in needle guidance and placement for procedures requiring needle placement in or near specific anatomical locations not easily accessible without such  assistance. Exposure Time: Please see nurses notes. Contrast: None used. Fluoroscopic Guidance: I was personally present during the use of fluoroscopy. "Tunnel Vision Technique" used to obtain the best possible view of the target area. Parallax error corrected before commencing the procedure. "Direction-depth-direction" technique used to introduce the needle under continuous pulsed fluoroscopy. Once target was reached, antero-posterior, oblique, and lateral fluoroscopic projection used confirm needle placement in all planes. Images permanently stored in EMR. Interpretation: No contrast injected. I personally interpreted the imaging intraoperatively. Adequate needle placement confirmed in multiple planes. Permanent images saved into the patient's record.  Antibiotic Prophylaxis:   Anti-infectives (From admission, onward)   None     Indication(s): None identified  Post-operative Assessment:  Post-procedure Vital Signs:  Pulse/HCG Rate: 9690 Temp: (!) 97.4 F (36.3 C) Resp: 16 BP: 138/80 SpO2: 100 %  EBL: None  Complications: No immediate post-treatment complications observed by team, or reported by patient.  Note: The patient tolerated the entire procedure well. A repeat set of vitals were taken after the procedure and the patient was kept under observation following institutional policy, for this type of procedure. Post-procedural neurological assessment was performed, showing return to baseline, prior to discharge. The patient was provided with post-procedure discharge instructions, including a section on how to identify potential problems. Should any problems arise concerning this procedure, the patient was given instructions to immediately contact us, at any time, without hesitation. In any case, we plan to contact the patient by telephone for a follow-up status report regarding this interventional procedure.  Comments:  No additional relevant information.  Plan of Care  Orders:   Orders Placed This Encounter  Procedures  . [facility] C-ARM 1-60 MIN NO REPORT    Intraoperative interpretation by procedural physician at [facility].    Standing Status:   Standing    Number of Occurrences:   1    Order  Specific Question:   Reason for exam:    Answer:   Assistance in needle guidance and placement for procedures requiring needle placement in or near specific anatomical locations not easily accessible without such assistance.   Medications ordered for procedure: Meds ordered this encounter  Medications  . lidocaine (XYLOCAINE) 2 % (with pres) injection 400 mg  . fentaNYL (SUBLIMAZE) injection 25-50 mcg    Make sure Narcan is available in the pyxis when using this medication. In the event of respiratory depression (RR< 8/min): Titrate NARCAN (naloxone) in increments of 0.1 to 0.2 mg IV at 2-3 minute intervals, until desired degree of reversal.  . ropivacaine (PF) 2 mg/mL (0.2%) (NAROPIN) injection 9 mL  . ropivacaine (PF) 2 mg/mL (0.2%) (NAROPIN) injection 9 mL  . dexamethasone (DECADRON) injection 10 mg  . dexamethasone (DECADRON) injection 10 mg  . tiZANidine (ZANAFLEX) 4 MG tablet    Sig: Take 1 tablet (4 mg total) by mouth 2 (two) times daily as needed for muscle spasms.    Dispense:  30 tablet    Refill:  1    Do not place this medication, or any other prescription from our practice, on "Automatic Refill". Patient may have prescription filled one day early if pharmacy is closed on scheduled refill date.   Medications administered: We administered lidocaine, fentaNYL, ropivacaine (PF) 2 mg/mL (0.2%), ropivacaine (PF) 2 mg/mL (0.2%), dexamethasone, and dexamethasone.  See the medical record for exact dosing, route, and time of administration.  Follow-up plan:   Return in about 8 weeks (around 04/27/2020) for Post Procedure Evaluation, virtual.       Interventional management options:  Considering:   Lumbar RFA Sprint PNS medial  branch Diagnostic caudal ESI  Diagnostic left sacroiliac joint block  Diagnostic right IA hip joint injection    Recent Visits Date Type Provider Dept  01/16/20 Office Visit Milinda Pointer, MD [facility]  12/31/19 Procedure visit Milinda Pointer, MD [facility]  12/25/19 Office Visit Milinda Pointer, MD [facility]  Showing recent visits within past 90 days and meeting all other requirements Today's Visits Date Type Provider Dept  03/02/20 Procedure visit Gillis Santa, MD [facility]  Showing today's visits and meeting all other requirements Future Appointments Date Type Provider Dept  04/22/20 Appointment Gillis Santa, MD [facility]  Showing future appointments within next 90 days and meeting all other requirements  Disposition: Discharge home  Discharge (Date  Time): 03/02/2020; 1050 hrs.   Primary Care Physician: [facility] Location: [facility] Note by: Gillis Santa, MD Date: 03/02/2020; Time: 11:43 AM  Disclaimer:  Medicine is not an exact science. The only guarantee in medicine is that nothing is guaranteed. It is important to note that the decision to proceed with this intervention was based on the information collected from the patient. The Data and conclusions were drawn from the patient's questionnaire, the interview, and the physical examination. Because the information was provided in large part by the patient, it cannot be guaranteed that it has not been purposely or unconsciously manipulated. Every effort has been made to obtain as much relevant data as possible for this evaluation. It is important to note that the conclusions that lead to this procedure are derived in large part from the available data. Always take into account that the treatment will also be dependent on availability of resources and existing treatment guidelines, considered by other Pain  Management Practitioners as being common knowledge and  practice, at the time of the intervention. For Medico-Legal purposes, it is also important to point out that variation in procedural techniques and pharmacological choices are the acceptable norm. The indications, contraindications, technique, and results of the above procedure should only be interpreted and judged by a Board-Certified Interventional Pain Specialist with extensive familiarity and expertise in the same exact procedure and technique.

## 2020-03-02 NOTE — Patient Instructions (Signed)

## 2020-03-02 NOTE — Progress Notes (Signed)
Oral Oncology Pharmacist Encounter  Patient's insurance now requires that patient fill Verzenio through [employer]. Prescription redirected to [facility].  Leron Croak, PharmD, BCPS Hematology/Oncology Clinical Pharmacist [facility] [phone] 03/02/2020 8:56 AM

## 2020-03-02 NOTE — Progress Notes (Signed)
Safety precautions to be maintained throughout the outpatient stay will include: orient to surroundings, keep bed in low position, maintain call bell within reach at all times, provide assistance with transfer out of bed and ambulation.  

## 2020-03-03 ENCOUNTER — Telehealth: Payer: Self-pay | Admitting: *Deleted

## 2020-03-03 ENCOUNTER — Other Ambulatory Visit: Payer: Self-pay

## 2020-03-03 ENCOUNTER — Encounter: Payer: Self-pay | Admitting: Hematology

## 2020-03-03 ENCOUNTER — Telehealth: Payer: Self-pay | Admitting: Hematology

## 2020-03-03 ENCOUNTER — Inpatient Hospital Stay: Payer: 59

## 2020-03-03 VITALS — BP 141/76 | HR 100 | Resp 18

## 2020-03-03 DIAGNOSIS — C50911 Malignant neoplasm of unspecified site of right female breast: Secondary | ICD-10-CM | POA: Diagnosis not present

## 2020-03-03 DIAGNOSIS — C50919 Malignant neoplasm of unspecified site of unspecified female breast: Secondary | ICD-10-CM

## 2020-03-03 MED ORDER — FULVESTRANT 250 MG/5ML IM SOLN
INTRAMUSCULAR | Status: AC
  Filled 2020-03-03: qty 10

## 2020-03-03 MED ORDER — FULVESTRANT 250 MG/5ML IM SOLN
500.0000 mg | Freq: Once | INTRAMUSCULAR | Status: AC
  Administered 2020-03-03: 500 mg via INTRAMUSCULAR

## 2020-03-03 NOTE — Telephone Encounter (Signed)
scheduled apt per 9/28 sch msg - no answer and unable to leave message for pt . Pt to get an updated schedule next visit.

## 2020-03-03 NOTE — Telephone Encounter (Signed)
No problems post procedure. 

## 2020-03-03 NOTE — Patient Instructions (Signed)
Fulvestrant injection What is this medicine? FULVESTRANT (ful VES trant) blocks the effects of estrogen. It is used to treat breast cancer. This medicine may be used for other purposes; ask your health care provider or pharmacist if you have questions. COMMON BRAND NAME(S): FASLODEX What should I tell my health care provider before I take this medicine? They need to know if you have any of these conditions:  bleeding disorders  liver disease  low blood counts, like low white cell, platelet, or red cell counts  an unusual or allergic reaction to fulvestrant, other medicines, foods, dyes, or preservatives  pregnant or trying to get pregnant  breast-feeding How should I use this medicine? This medicine is for injection into a muscle. It is usually given by a health care professional in a hospital or clinic setting. Talk to your pediatrician regarding the use of this medicine in children. Special care may be needed. Overdosage: If you think you have taken too much of this medicine contact a poison control center or emergency room at once. NOTE: This medicine is only for you. Do not share this medicine with others. What if I miss a dose? It is important not to miss your dose. Call your doctor or health care professional if you are unable to keep an appointment. What may interact with this medicine?  medicines that treat or prevent blood clots like warfarin, enoxaparin, dalteparin, apixaban, dabigatran, and rivaroxaban This list may not describe all possible interactions. Give your health care provider a list of all the medicines, herbs, non-prescription drugs, or dietary supplements you use. Also tell them if you smoke, drink alcohol, or use illegal drugs. Some items may interact with your medicine. What should I watch for while using this medicine? Your condition will be monitored carefully while you are receiving this medicine. You will need important blood work done while you are taking  this medicine. Do not become pregnant while taking this medicine or for at least 1 year after stopping it. Women of child-bearing potential will need to have a negative pregnancy test before starting this medicine. Women should inform their doctor if they wish to become pregnant or think they might be pregnant. There is a potential for serious side effects to an unborn child. Men should inform their doctors if they wish to father a child. This medicine may lower sperm counts. Talk to your health care professional or pharmacist for more information. Do not breast-feed an infant while taking this medicine or for 1 year after the last dose. What side effects may I notice from receiving this medicine? Side effects that you should report to your doctor or health care professional as soon as possible:  allergic reactions like skin rash, itching or hives, swelling of the face, lips, or tongue  feeling faint or lightheaded, falls  pain, tingling, numbness, or weakness in the legs  signs and symptoms of infection like fever or chills; cough; flu-like symptoms; sore throat  vaginal bleeding Side effects that usually do not require medical attention (report to your doctor or health care professional if they continue or are bothersome):  aches, pains  constipation  diarrhea  headache  hot flashes  nausea, vomiting  pain at site where injected  stomach pain This list may not describe all possible side effects. Call your doctor for medical advice about side effects. You may report side effects to FDA at 1-800-FDA-1088. Where should I keep my medicine? This drug is given in a hospital or clinic and will   not be stored at home. NOTE: This sheet is a summary. It may not cover all possible information. If you have questions about this medicine, talk to your doctor, pharmacist, or health care provider.  2020 Elsevier/Gold Standard (2017-08-31 11:34:41)  

## 2020-03-06 ENCOUNTER — Encounter: Payer: Self-pay | Admitting: Hematology

## 2020-03-12 ENCOUNTER — Ambulatory Visit: Payer: 59 | Admitting: Pain Medicine

## 2020-03-23 ENCOUNTER — Inpatient Hospital Stay: Payer: 59

## 2020-03-23 ENCOUNTER — Inpatient Hospital Stay: Payer: 59 | Attending: Hematology

## 2020-03-23 ENCOUNTER — Inpatient Hospital Stay (HOSPITAL_BASED_OUTPATIENT_CLINIC_OR_DEPARTMENT_OTHER): Payer: 59 | Admitting: Hematology

## 2020-03-23 ENCOUNTER — Other Ambulatory Visit: Payer: Self-pay

## 2020-03-23 VITALS — BP 160/82 | HR 78 | Temp 96.1°F | Resp 18 | Ht 67.0 in | Wt 231.9 lb

## 2020-03-23 DIAGNOSIS — M545 Low back pain, unspecified: Secondary | ICD-10-CM | POA: Diagnosis not present

## 2020-03-23 DIAGNOSIS — C7801 Secondary malignant neoplasm of right lung: Secondary | ICD-10-CM | POA: Diagnosis not present

## 2020-03-23 DIAGNOSIS — R7989 Other specified abnormal findings of blood chemistry: Secondary | ICD-10-CM

## 2020-03-23 DIAGNOSIS — R945 Abnormal results of liver function studies: Secondary | ICD-10-CM | POA: Diagnosis not present

## 2020-03-23 DIAGNOSIS — Z833 Family history of diabetes mellitus: Secondary | ICD-10-CM | POA: Insufficient documentation

## 2020-03-23 DIAGNOSIS — Z87891 Personal history of nicotine dependence: Secondary | ICD-10-CM | POA: Insufficient documentation

## 2020-03-23 DIAGNOSIS — C50919 Malignant neoplasm of unspecified site of unspecified female breast: Secondary | ICD-10-CM | POA: Diagnosis not present

## 2020-03-23 DIAGNOSIS — M25552 Pain in left hip: Secondary | ICD-10-CM | POA: Diagnosis not present

## 2020-03-23 DIAGNOSIS — Z8249 Family history of ischemic heart disease and other diseases of the circulatory system: Secondary | ICD-10-CM | POA: Diagnosis not present

## 2020-03-23 DIAGNOSIS — C50811 Malignant neoplasm of overlapping sites of right female breast: Secondary | ICD-10-CM | POA: Diagnosis present

## 2020-03-23 DIAGNOSIS — Z801 Family history of malignant neoplasm of trachea, bronchus and lung: Secondary | ICD-10-CM | POA: Insufficient documentation

## 2020-03-23 DIAGNOSIS — D259 Leiomyoma of uterus, unspecified: Secondary | ICD-10-CM | POA: Diagnosis not present

## 2020-03-23 DIAGNOSIS — Z79899 Other long term (current) drug therapy: Secondary | ICD-10-CM | POA: Insufficient documentation

## 2020-03-23 DIAGNOSIS — M25551 Pain in right hip: Secondary | ICD-10-CM | POA: Insufficient documentation

## 2020-03-23 DIAGNOSIS — Z170 Estrogen receptor positive status [ER+]: Secondary | ICD-10-CM | POA: Diagnosis not present

## 2020-03-23 DIAGNOSIS — Z5111 Encounter for antineoplastic chemotherapy: Secondary | ICD-10-CM | POA: Diagnosis present

## 2020-03-23 DIAGNOSIS — C7802 Secondary malignant neoplasm of left lung: Secondary | ICD-10-CM | POA: Diagnosis not present

## 2020-03-23 LAB — CBC WITH DIFFERENTIAL/PLATELET
Abs Immature Granulocytes: 0 10*3/uL (ref 0.00–0.07)
Basophils Absolute: 0 10*3/uL (ref 0.0–0.1)
Basophils Relative: 1 %
Eosinophils Absolute: 0 10*3/uL (ref 0.0–0.5)
Eosinophils Relative: 1 %
HCT: 34.9 % — ABNORMAL LOW (ref 36.0–46.0)
Hemoglobin: 11.6 g/dL — ABNORMAL LOW (ref 12.0–15.0)
Immature Granulocytes: 0 %
Lymphocytes Relative: 70 %
Lymphs Abs: 2.2 10*3/uL (ref 0.7–4.0)
MCH: 33 pg (ref 26.0–34.0)
MCHC: 33.2 g/dL (ref 30.0–36.0)
MCV: 99.1 fL (ref 80.0–100.0)
Monocytes Absolute: 0.2 10*3/uL (ref 0.1–1.0)
Monocytes Relative: 6 %
Neutro Abs: 0.7 10*3/uL — ABNORMAL LOW (ref 1.7–7.7)
Neutrophils Relative %: 22 %
Platelets: 284 10*3/uL (ref 150–400)
RBC: 3.52 MIL/uL — ABNORMAL LOW (ref 3.87–5.11)
RDW: 14 % (ref 11.5–15.5)
WBC: 3.2 10*3/uL — ABNORMAL LOW (ref 4.0–10.5)
nRBC: 0 % (ref 0.0–0.2)

## 2020-03-23 LAB — CMP (CANCER CENTER ONLY)
ALT: 72 U/L — ABNORMAL HIGH (ref 0–44)
AST: 67 U/L — ABNORMAL HIGH (ref 15–41)
Albumin: 3.3 g/dL — ABNORMAL LOW (ref 3.5–5.0)
Alkaline Phosphatase: 159 U/L — ABNORMAL HIGH (ref 38–126)
Anion gap: 7 (ref 5–15)
BUN: 11 mg/dL (ref 6–20)
CO2: 29 mmol/L (ref 22–32)
Calcium: 9.5 mg/dL (ref 8.9–10.3)
Chloride: 106 mmol/L (ref 98–111)
Creatinine: 1.34 mg/dL — ABNORMAL HIGH (ref 0.44–1.00)
GFR, Estimated: 44 mL/min — ABNORMAL LOW (ref >60)
Glucose, Bld: 90 mg/dL (ref 70–99)
Potassium: 4 mmol/L (ref 3.5–5.1)
Sodium: 142 mmol/L (ref 135–145)
Total Bilirubin: 0.2 mg/dL — ABNORMAL LOW (ref 0.3–1.2)
Total Protein: 7.4 g/dL (ref 6.5–8.1)

## 2020-03-23 MED ORDER — FULVESTRANT 250 MG/5ML IM SOLN: 500.0000 mg | Freq: Once | INTRAMUSCULAR | Status: DC

## 2020-03-23 MED ORDER — HYDROCOD POLST-CPM POLST ER 10-8 MG/5ML PO SUER: 5.0000 mL | Freq: Two times a day (BID) | ORAL | 0 refills | Status: DC | PRN

## 2020-03-23 MED ORDER — FULVESTRANT 250 MG/5ML IM SOLN
INTRAMUSCULAR | Status: AC
  Filled 2020-03-23: qty 10

## 2020-03-23 MED ORDER — FULVESTRANT 250 MG/5ML IM SOLN
500.0000 mg | Freq: Once | INTRAMUSCULAR | Status: AC
  Administered 2020-03-23: 500 mg via INTRAMUSCULAR

## 2020-03-23 NOTE — Patient Instructions (Signed)
Fulvestrant injection What is this medicine? FULVESTRANT (ful VES trant) blocks the effects of estrogen. It is used to treat breast cancer. This medicine may be used for other purposes; ask your health care provider or pharmacist if you have questions. COMMON BRAND NAME(S): FASLODEX What should I tell my health care provider before I take this medicine? They need to know if you have any of these conditions:  bleeding disorders  liver disease  low blood counts, like low white cell, platelet, or red cell counts  an unusual or allergic reaction to fulvestrant, other medicines, foods, dyes, or preservatives  pregnant or trying to get pregnant  breast-feeding How should I use this medicine? This medicine is for injection into a muscle. It is usually given by a health care professional in a hospital or clinic setting. Talk to your pediatrician regarding the use of this medicine in children. Special care may be needed. Overdosage: If you think you have taken too much of this medicine contact a poison control center or emergency room at once. NOTE: This medicine is only for you. Do not share this medicine with others. What if I miss a dose? It is important not to miss your dose. Call your doctor or health care professional if you are unable to keep an appointment. What may interact with this medicine?  medicines that treat or prevent blood clots like warfarin, enoxaparin, dalteparin, apixaban, dabigatran, and rivaroxaban This list may not describe all possible interactions. Give your health care provider a list of all the medicines, herbs, non-prescription drugs, or dietary supplements you use. Also tell them if you smoke, drink alcohol, or use illegal drugs. Some items may interact with your medicine. What should I watch for while using this medicine? Your condition will be monitored carefully while you are receiving this medicine. You will need important blood work done while you are taking  this medicine. Do not become pregnant while taking this medicine or for at least 1 year after stopping it. Women of child-bearing potential will need to have a negative pregnancy test before starting this medicine. Women should inform their doctor if they wish to become pregnant or think they might be pregnant. There is a potential for serious side effects to an unborn child. Men should inform their doctors if they wish to father a child. This medicine may lower sperm counts. Talk to your health care professional or pharmacist for more information. Do not breast-feed an infant while taking this medicine or for 1 year after the last dose. What side effects may I notice from receiving this medicine? Side effects that you should report to your doctor or health care professional as soon as possible:  allergic reactions like skin rash, itching or hives, swelling of the face, lips, or tongue  feeling faint or lightheaded, falls  pain, tingling, numbness, or weakness in the legs  signs and symptoms of infection like fever or chills; cough; flu-like symptoms; sore throat  vaginal bleeding Side effects that usually do not require medical attention (report to your doctor or health care professional if they continue or are bothersome):  aches, pains  constipation  diarrhea  headache  hot flashes  nausea, vomiting  pain at site where injected  stomach pain This list may not describe all possible side effects. Call your doctor for medical advice about side effects. You may report side effects to FDA at 1-800-FDA-1088. Where should I keep my medicine? This drug is given in a hospital or clinic and will   not be stored at home. NOTE: This sheet is a summary. It may not cover all possible information. If you have questions about this medicine, talk to your doctor, pharmacist, or health care provider.  2020 Elsevier/Gold Standard (2017-08-31 11:34:41)  

## 2020-03-23 NOTE — Progress Notes (Signed)
HEMATOLOGY/ONCOLOGY CLINIC NOTE  Date of Service: 03/23/2020  Patient Care Team: [facility] as PCP - General Everardo Beals, NP as Nurse Practitioner [facility]  CHIEF COMPLAINTS/PURPOSE OF CONSULTATION:  -f/u for metastatic breast cancer   HISTORY OF PRESENTING ILLNESS:  Brandy [name] is a wonderful 54 y.o. female who has been referred to [place] by [facility] for evaluation and management of her suspected breast cancer.   The pt reports that she was having increasing low back pain on her right side that would not resolve, so she went to the ED. This pain has not resolved. She notes pain in her hips and knees.   Of note prior to the patient's visit today, pt has had an abdominal limited right upper ultrasound completed on 02/18/2019 with results revealing "Probable hepatic steatosis. No other abnormality seen in the right upper quadrant of the abdomen."  Pt had abdomen and pelvis CT completed on 02/18/2019 with results revealing "5cm spiculated soft tissue mass in inferior right breast, highly suspicious for primary breast carcinoma. No acute findings or metastatic disease within the abdomen or pelvis. Multiple small pulmonary nodules in both lung bases, consistent with pulmonary metastases. 4.5 cm uterine fibroid."  Most recent lab results (02/18/2019) of CBC is as follows: all values are WNL except for glucose at 121, calcium at 8.7, AST at 49, and ALT at 53.  On review of systems, pt reports pedal edema and denies chest pain, shortness of breath, weight loss, and any other symptoms.  Her last known mammography was on 06/18/2012.   On Social Hx the pt reports that she is a smoker and she smokes about half a pack per day. She does not drink alcohol. She has three children: age 58, 28, and 69.  On Family Hx the pt reports lung cancer. Maternal Cousin passed from stomach cancer at the age of 21.    INTERVAL HISTORY:  Brandy [name] is a 54 y.o. female here for evaluation and management of metastatic breast cancer. The patient's last visit with [place] was on 02/24/2020. The pt reports that she is doing well overall.  The pt reports that she had significant swelling at the injection site for the Faslodex injections. These areas became very sore and sensitive to touch. The soreness is improving. She has been having diarrhea for the last week. Pt has only required Imodium twice. Pt denies a large volume of stool, but is having frequent, watery stool.   Last Thursday she began to experience nasal congestion. Pt does not have a history of seasonal allergies. She is also experiencing runny nose and cough but denies fevers or chills. Pt continues Mucinex and [employer].   She is having discomfort under her right breast. Pt is using Lotrimin & [employer] but does not have a rash in the area. Pt is also having skin irritation in her bikini area that is more painful when rubbed against.   Pt has an appointment with Gastroenterology on 03/26/2020.  Lab results today (03/23/20) of CBC w/diff and CMP is as follows: all values are WNL except for WBC at 3.2K, RBC at 3.52, Hgb at 11.6, HCT at 34.9, Neutro Abs at 0.7K, Creatinine at 1.34, Albumin at 3.3, AST at 67, ALT at 72, ALP at 159, Total Bilirubin at 0.2, GFR Est At 44.  On review of systems, pt reports rhinorrhea, cough, sneezing, diarrhea, skin irritation and denies fevers, chills, night sweats, SOB, hot flash, abdominal  pain and any other symptoms.   MEDICAL HISTORY:  Past Medical History:  Diagnosis Date  . Acute pansinusitis 08/02/2017  . Arthritis   . ASD (atrial septal defect)    s/p closure with Amplatzer device 10/05/04 (Dr. Myriam Jacobson, [facility]) 10/05/04  . Cataract   . GERD (gastroesophageal reflux disease)   . Headache   . Heart murmur    no longer heard  . Legally blind in right eye, as defined in [place]   . Lumbar herniated disc   . Sciatica   Uterine  Fibroids  SURGICAL HISTORY: Past Surgical History:  Procedure Laterality Date  . ABLATION    . BREAST SURGERY Bilateral 2011   Breast Reduction Surgery  . BUNIONECTOMY    . CARDIAC CATHETERIZATION     10/05/04 [facility] LM < 25%, otherwise normal coronaries. No pulmonary HTN, Mildly enlarged RV. Secundum ASD s/p closure.  Marland Kitchen CARDIAC SURGERY    . CATARACT EXTRACTION    . CLEFT PALATE REPAIR     s/p cleft lip and palate repair  . LUMBAR LAMINECTOMY/DECOMPRESSION MICRODISCECTOMY Left 05/23/2016   Procedure: LEFT L4-L5 LATERAL RECESS DECOMPRESSION WITH CENTRAL AND RIGHT DECOMPRESSION VIA LEFT SIDE;  Surgeon: [name], MD;  Location: [place];  Service: Orthopedics;  Laterality: Left;  . LUMBAR LAMINECTOMY/DECOMPRESSION MICRODISCECTOMY Left 05/23/2016   Procedure: LUMBAR LAMINECTOMY/DECOMPRESSION MICRODISCECTOMY Lumbar five - Sacral One 1 LEVEL;  Surgeon: [name], MD;  Location: [place];  Service: Orthopedics;  Laterality: Left;  . SHOULDER INJECTION Left 05/23/2016   Procedure: SHOULDER INJECTION;  Surgeon: [name], MD;  Location: [place];  Service: Orthopedics;  Laterality: Left;  band-aid per pa-c  . TRANSTHORACIC ECHOCARDIOGRAM     12/15/05 [facility]): Mild LVH, EF > 62%, grade 1 diastolic dysfunction, Trivial MR/PR/TR.  Endometrial ablation 2003 Breast Reduction, bilateral 2011  SOCIAL HISTORY: Social History   Socioeconomic History  . Marital status: Married    Spouse name: Not on file  . Number of children: 3  . Years of education: Not on file  . Highest education level: Not on file  Occupational History  . Not on file  Tobacco Use  . Smoking status: Former Smoker    Packs/day: 0.25    Years: 25.00    Pack years: 6.25    Types: Cigarettes    Quit date: 02/05/2019    Years since quitting: 1.1  . Smokeless tobacco: Never Used  Vaping Use  . Vaping Use: Never used  Substance and Sexual Activity  . Alcohol use: No  . Drug use: No  . Sexual activity: Not on file  Other  Topics Concern  . Not on file  Social History Narrative  . Not on file   Social Determinants of Health   Financial Resource Strain:   . Difficulty of Paying Living Expenses: Not on file  Food Insecurity:   . Worried About Charity fundraiser in the Last Year: Not on file  . Ran Out of Food in the Last Year: Not on file  Transportation Needs:   . Lack of Transportation (Medical): Not on file  . Lack of Transportation (Non-Medical): Not on file  Physical Activity:   . Days of Exercise per Week: Not on file  . Minutes of Exercise per Session: Not on file  Stress:   . Feeling of Stress : Not on file  Social Connections:   . Frequency of Communication with Friends and Family: Not on file  . Frequency of Social  Gatherings with Friends and Family: Not on file  . Attends Religious Services: Not on file  . Active Member of Clubs or Organizations: Not on file  . Attends Archivist Meetings: Not on file  . Marital Status: Not on file  Intimate Partner Violence:   . Fear of Current or Ex-Partner: Not on file  . Emotionally Abused: Not on file  . Physically Abused: Not on file  . Sexually Abused: Not on file    FAMILY HISTORY: Family History  Problem Relation Age of Onset  . Diabetes Mother   . High blood pressure Mother   . Cancer Father        Lung  . Colon cancer Neg Hx   . Colon polyps Neg Hx   . Esophageal cancer Neg Hx   . Rectal cancer Neg Hx   . Stomach cancer Neg Hx     ALLERGIES:  is allergic to zithromax [azithromycin] and psyllium.  MEDICATIONS:  Current Outpatient Medications  Medication Sig Dispense Refill  . abemaciclib (VERZENIO) 100 MG tablet TAKE 1 TABLET (100 MG TOTAL) BY MOUTH 2 (TWO) TIMES DAILY. 56 tablet 0  . albuterol (PROVENTIL) (2.5 MG/3ML) 0.083% nebulizer solution Take 2.5 mg by nebulization every 6 (six) hours as needed for wheezing or shortness of breath.    . ALPRAZolam (XANAX) 1 MG tablet Take 1 mg by mouth daily as needed for  anxiety.     Marland Kitchen amitriptyline (ELAVIL) 75 MG tablet     . benzonatate (TESSALON) 100 MG capsule Take 1 capsule (100 mg total) by mouth 3 (three) times daily as needed for cough. 60 capsule 0  . guaiFENesin (MUCINEX) 600 MG 12 hr tablet Take 1 tablet (600 mg total) by mouth 2 (two) times daily. 60 tablet 2  . Magnesium 500 MG TABS Take 1 tablet (500 mg total) by mouth in the morning and at bedtime. 60 tablet 1  . omeprazole (PRILOSEC) 10 MG capsule Take 20 mg by mouth daily.     . ondansetron (ZOFRAN) 8 MG tablet Take 1 tablet (8 mg total) by mouth every 8 (eight) hours as needed for nausea or vomiting. 30 tablet 3  . tiZANidine (ZANAFLEX) 4 MG tablet Take 1 tablet (4 mg total) by mouth 2 (two) times daily as needed for muscle spasms. 30 tablet 1  . Vitamin D, Ergocalciferol, (DRISDOL) 1.25 MG (50000 UNIT) CAPS capsule TAKE 1 CAPSULE BY MOUTH 1 TIME A WEEK 12 capsule 3   Current Facility-Administered Medications  Medication Dose Route Frequency Provider Last Rate Last Admin  . 0.9 %  sodium chloride infusion  500 mL Intravenous Continuous Nandigam, Venia Minks, MD        REVIEW OF SYSTEMS:   A 10+ POINT REVIEW OF SYSTEMS WAS OBTAINED including neurology, dermatology, psychiatry, cardiac, respiratory, lymph, extremities, GI, GU, Musculoskeletal, constitutional, breasts, reproductive, HEENT.  All pertinent positives are noted in the HPI.  All others are negative.   PHYSICAL EXAMINATION: ECOG FS:1 - Symptomatic but completely ambulatory  There were no vitals filed for this visit. Wt Readings from Last 3 Encounters:  03/02/20 233 lb (105.7 kg)  02/24/20 233 lb 11.2 oz (106 kg)  01/30/20 228 lb (103.4 kg)   There is no height or weight on file to calculate BMI.    GENERAL:alert, in no acute distress and comfortable SKIN: maceration of the groin area, non-indurated EYES: conjunctiva are pink and non-injected, sclera anicteric OROPHARYNX: MMM, no exudates, no oropharyngeal erythema or  ulceration NECK:  supple, no JVD LYMPH:  no palpable lymphadenopathy in the cervical, axillary or inguinal regions LUNGS: clear to auscultation b/l with normal respiratory effort HEART: regular rate & rhythm ABDOMEN:  normoactive bowel sounds , non tender, not distended. No palpable hepatosplenomegaly.  Extremity: no pedal edema PSYCH: alert & oriented x 3 with fluent speech NEURO: no focal motor/sensory deficits  LABORATORY DATA:  I have reviewed the data as listed  . CBC Latest Ref Rng & Units 02/24/2020 12/20/2019 11/01/2019  WBC 4.0 - 10.5 K/uL 6.0 5.1 4.0  Hemoglobin 12.0 - 15.0 g/dL 12.1 11.4(L) 11.1(L)  Hematocrit 36 - 46 % 37.6 35.1(L) 33.9(L)  Platelets 150 - 400 K/uL 297 293 242    . CMP Latest Ref Rng & Units 02/24/2020 12/20/2019 11/01/2019  Glucose 70 - 99 mg/dL 127(H) 160(H) 127(H)  BUN 6 - 20 mg/dL _0 Creatinine 0.44 - 1.00 mg/dL 1.16(H) 1.12(H) 1.35(H)  Sodium 135 - 145 mmol/L 140 140 140  Potassium 3.5 - 5.1 mmol/L 4.1 3.7 3.7  Chloride 98 - 111 mmol/L 102 104 103  CO2 22 - 32 mmol/L 32 24 24  Calcium 8.9 - 10.3 mg/dL 9.2 9.3 9.2  Total Protein 6.5 - 8.1 g/dL 7.3 7.5 7.4  Total Bilirubin 0.3 - 1.2 mg/dL 0.5 0.6 0.5  Alkaline Phos 38 - 126 U/L 192(H) 149(H) 168(H)  AST 15 - 41 U/L 108(H) 61(H) 85(H)  ALT 0 - 44 U/L 174(H) 79(H) 100(H)   Component     Latest Ref Rng & Units 03/08/2019 03/22/2019  FSH     mIU/mL 65.5   LH     mIU/mL 46.6   Estrogen     pg/mL 82   Progesterone     ng/mL <0.1   Estradiol     pg/mL  <5.0        RADIOGRAPHIC STUDIES: I have personally reviewed the radiological images as listed and agreed with the findings in the report. DG PAIN CLINIC C-ARM 1-60 MIN NO REPORT  Result Date: 03/02/2020 Fluoro was used, but no Radiologist interpretation will be provided. Please refer to "NOTES" tab for provider progress note.   ASSESSMENT & PLAN:   Brandy [name] is a 54 y.o. female with:  1. Metastatic breast cancer  ER+/PRneg/Her2 neg  02/28/2019 neck CT with results revealing "negative for mass or adenopathy in the neck."  03/04/2019 head MRI with results revealing "Negative for metastatic disease.  No acute abnormality in the brain."  2. Likely Pulmonary metastases  02/18/2019 chest and abdomen with results revealing "5cm spiculated soft tissue mass in inferior right breast, highly suspicious for primary breast carcinoma. No acute findings or metastatic disease within the abdomen or pelvis. Multiple small pulmonary nodules in both lung bases, consistent with pulmonary metastases. 4.5 cm uterine fibroid."  02/28/2019 C/A/P CT with results revealing "Irregular solid 5.0 cm right breast mass, suspicious for primary right breast malignancy. Innumerable solid pulmonary nodules scattered throughout both lungs, compatible with pulmonary metastases. No evidence of metastatic disease in the abdomen, pelvis or skeleton. Mildly enlarged and probably myomatous uterus. Simple 1.4 cm left adnexal cyst requires no follow-up. This recommendation follows ACR consensus guidelines: White Paper of the ACR Incidental Findings Committee II on Adnexal Findings. J Am Coll Radiol [id number]. Aortic Atherosclerosis (ICD10-I70.0)."  NUCLEAR MEDICINE WHOLE BODY BONE SCAN completed on 03/19/2019 with results revealing "1. No scintigraphic evidence skeletal metastasis. 2. Degenerative bone disease in the posterior elements of the upper and mid lumbar spine."  04/09/2019 Bone Density ([id number]) which revealed "The BMD measured at Femur Neck Right is 1.153 g/cm2 with a T-score of 0.8. This patient is considered normal according to [facility]) criteria. Lumbar spine was not utilized due to advanced degenerative changes. The scan quality is good. Femur Neck Right 04/09/2019 54.7 Normal 0.8 1.153 g/cm2. Left Forearm Radius 33% 04/09/2019 54.7 Normal 0.8 0.949 g/cm2."  08/04/2019 CT Angio Chest ([id number]) revealed  "1. No lobar or central pulmonary embolus detected. Exam is limited secondary to respiratory motion. 2. Mild ground-glass attenuation may represent mild pneumonitis or areas of air trapping. 3. Signs of atrial septal closure. 4. Decrease in size and number of bilateral pulmonary nodules, marked response noted on today's exam with the only nodule remaining near a cm in the right upper lobe and the smaller nodules that were present on the previous examination throughout the chest no longer measurable though the lower lobes are limited by respiratory motion. 5. Decreased size of right breast mass. 6. Probable hepatic steatosis."  3. Abnormal Liver function test -- previous? Fatty liver 07/03/2019 [place] Abd ([id number]) revealed "1. No acute findings. Normal gallbladder. No bile duct dilation. 2. Significant increased liver parenchymal echogenicity consistent with extensive hepatic steatosis."  PLAN: -Discussed pt labwork today, 03/23/20; blood counts are steady, blood chemistries have improved. -Discussed 02/24/2020 CA 15-3 is slightly elevated at 26.6 & CA 27.29 is WNL at 34.2 - both are improving. -No lab or clinical evidence of Metastatic Breast Cancer progression at this time.  -Recommend pt get tested for COVID19 & Flu if her respiratory symptoms persist.  -Recommend triple antibiotic ointment and cool, dry clothing for groin area - OTC Voltaren would also be appropriate. -Will continue 100 mg Verzenio BID. Pt has no prohibitive toxicities.  -Will give Faslodex today and continue q4weeks.  -Recommend pt f/u with GI as scheduled -Rx Tussinex   FOLLOW UP: 2nd dose of Faslodex today Plz change next dose form 10/26 to 1 additional week out Then monthly x 3 doses MD visit in 5 weeks with labs  The total time spent in the appt was 30 minutes and more than 50% was on counseling and direct patient cares.  All of the patient's questions were answered with apparent satisfaction. The patient knows to  call the clinic with any problems, questions or concerns.  Sullivan Lone MD [place] AAHIVMS [facility] Hematology/Oncology Physician [facility]  (Office):       [phone] (Work cell):  [phone] (Fax):           [phone]  03/23/2020 7:32 AM  I, Yevette Edwards, am acting as a scribe for Dr. Sullivan Lone.   .I have reviewed the above documentation for accuracy and completeness, and I agree with the above. Brunetta Genera MD

## 2020-03-24 ENCOUNTER — Other Ambulatory Visit: Payer: Self-pay | Admitting: Hematology

## 2020-03-24 ENCOUNTER — Encounter: Payer: Self-pay | Admitting: Hematology

## 2020-03-24 DIAGNOSIS — C50919 Malignant neoplasm of unspecified site of unspecified female breast: Secondary | ICD-10-CM

## 2020-03-25 ENCOUNTER — Encounter: Payer: Self-pay | Admitting: *Deleted

## 2020-03-25 ENCOUNTER — Other Ambulatory Visit: Payer: Self-pay | Admitting: *Deleted

## 2020-03-25 ENCOUNTER — Other Ambulatory Visit: Payer: Self-pay | Admitting: Hematology

## 2020-03-25 DIAGNOSIS — C50919 Malignant neoplasm of unspecified site of unspecified female breast: Secondary | ICD-10-CM

## 2020-03-25 MED ORDER — LIDOCAINE 5 % EX PTCH: 1.0000 | MEDICATED_PATCH | CUTANEOUS | 0 refills | Status: DC

## 2020-03-25 NOTE — Progress Notes (Unsigned)
tgr

## 2020-03-26 ENCOUNTER — Encounter: Payer: Self-pay | Admitting: Gastroenterology

## 2020-03-26 ENCOUNTER — Ambulatory Visit (INDEPENDENT_AMBULATORY_CARE_PROVIDER_SITE_OTHER): Payer: 59 | Admitting: Gastroenterology

## 2020-03-26 VITALS — BP 144/64 | HR 70 | Ht 67.0 in | Wt 229.0 lb

## 2020-03-26 DIAGNOSIS — K760 Fatty (change of) liver, not elsewhere classified: Secondary | ICD-10-CM

## 2020-03-26 DIAGNOSIS — R7989 Other specified abnormal findings of blood chemistry: Secondary | ICD-10-CM | POA: Insufficient documentation

## 2020-03-26 NOTE — Patient Instructions (Signed)
If you are age 55 or older, your body mass index should be between 23-30. Your Body mass index is 35.87 kg/m. If this is out of the aforementioned range listed, please consider follow up with your Primary Care Provider.  If you are age 70 or younger, your body mass index should be between 19-25. Your Body mass index is 35.87 kg/m. If this is out of the aformentioned range listed, please consider follow up with your Primary Care Provider.   Follow up as needed.  Elevated LFTs likely secondary to underlying fatty liver and treatment with Verzenio.

## 2020-03-26 NOTE — Progress Notes (Signed)
03/26/2020 Brandy [name] [id number] Oct 18, 1965   HISTORY OF PRESENT ILLNESS: This is a 54 year old female who is a patient of Dr. Woodward [name].  She is known here for previous colonoscopy as well as for evaluation of elevated LFTs in early 2019.  At that time extensive serologic evaluation was negative.  Ultrasound showed probable fatty liver.  She had not been seen here since that time.  She is back here again today in regards to elevated LFTs.  She was started on Verzenio for breast cancer in September 2020.  More significant LFT elevation was noted starting in November 2020.  Labs 3 days ago show an AST of 67, ALT 172, alk phos 159, total bili 0.2.  It looks like one of the highest elevations was about 5 months ago with an AST of 150, ALT 188, alk phos 180 and total bili still remaining normal at 0.4.  She says that she knows that the elevation is from the [employer].  She is also receiving Faslodex injections, but just started those recently.  She had previously been on Femara as well, but from what I can see Femara is not linked to hepatotoxicity/elevated liver enzymes.  She denies any GI complaints.  She reports extensive injection site pain from her Faslodex and says that she does not know how long she will be able to tolerate those injections.  Past Medical History:  Diagnosis Date  . Acute pansinusitis 08/02/2017  . Arthritis   . ASD (atrial septal defect)    s/p closure with Amplatzer device 10/05/04 (Dr. Myriam Jacobson, [facility]) 10/05/04  . Cataract   . GERD (gastroesophageal reflux disease)   . Headache   . Heart murmur    no longer heard  . Legally blind in right eye, as defined in [place]   . Lumbar herniated disc   . Sciatica    Past Surgical History:  Procedure Laterality Date  . ABLATION    . BREAST SURGERY Bilateral 2011   Breast Reduction Surgery  . BUNIONECTOMY    . CARDIAC CATHETERIZATION     10/05/04 [facility]): LM < 25%, otherwise normal coronaries. No pulmonary  HTN, Mildly enlarged RV. Secundum ASD s/p closure.  Marland Kitchen CARDIAC SURGERY    . CATARACT EXTRACTION    . CLEFT PALATE REPAIR     s/p cleft lip and palate repair  . LUMBAR LAMINECTOMY/DECOMPRESSION MICRODISCECTOMY Left 05/23/2016   Procedure: LEFT L4-L5 LATERAL RECESS DECOMPRESSION WITH CENTRAL AND RIGHT DECOMPRESSION VIA LEFT SIDE;  Surgeon: [name], MD;  Location: [place];  Service: Orthopedics;  Laterality: Left;  . LUMBAR LAMINECTOMY/DECOMPRESSION MICRODISCECTOMY Left 05/23/2016   Procedure: LUMBAR LAMINECTOMY/DECOMPRESSION MICRODISCECTOMY Lumbar five - Sacral One 1 LEVEL;  Surgeon: [name], MD;  Location: [place];  Service: Orthopedics;  Laterality: Left;  . SHOULDER INJECTION Left 05/23/2016   Procedure: SHOULDER INJECTION;  Surgeon: [name], MD;  Location: [place];  Service: Orthopedics;  Laterality: Left;  band-aid per pa-c  . TRANSTHORACIC ECHOCARDIOGRAM     12/15/05 [facility]): Mild LVH, EF > 79%, grade 1 diastolic dysfunction, Trivial MR/PR/TR.    reports that she quit smoking about 13 months ago. Her smoking use included cigarettes. She has a 6.25 pack-year smoking history. She has never used smokeless tobacco. She reports that she does not drink alcohol and does not use drugs. family history includes Cancer in her father; Diabetes in her mother; High blood pressure in her mother. Allergies  Allergen Reactions  . Zithromax [Azithromycin]  Shortness Of Breath and Itching    TOTAL BODY ITCHING [EVEN SOLES OF FEET] WHEEZING   . Psyllium Nausea And Vomiting      Outpatient Encounter Medications as of 03/26/2020  Medication Sig  . albuterol (PROVENTIL) (2.5 MG/3ML) 0.083% nebulizer solution Take 2.5 mg by nebulization every 6 (six) hours as needed for wheezing or shortness of breath.  . ALPRAZolam (XANAX) 1 MG tablet Take 1 mg by mouth daily as needed for anxiety.   Marland Kitchen amitriptyline (ELAVIL) 75 MG tablet   . benzonatate (TESSALON) 100 MG capsule Take 1 capsule (100 mg total) by  mouth 3 (three) times daily as needed for cough.  Marland Kitchen guaiFENesin (MUCINEX) 600 MG 12 hr tablet Take 1 tablet (600 mg total) by mouth 2 (two) times daily.  . Magnesium 500 MG TABS Take 1 tablet (500 mg total) by mouth in the morning and at bedtime.  Marland Kitchen omeprazole (PRILOSEC) 10 MG capsule Take 20 mg by mouth daily.   . ondansetron (ZOFRAN) 8 MG tablet Take 1 tablet (8 mg total) by mouth every 8 (eight) hours as needed for nausea or vomiting.  Marland Kitchen tiZANidine (ZANAFLEX) 4 MG tablet Take 1 tablet (4 mg total) by mouth 2 (two) times daily as needed for muscle spasms.  Marland Kitchen VERZENIO 100 MG tablet TAKE 1 TABLET (100 MG TOTAL) BY MOUTH 2 (TWO) TIMES DAILY.  Marland Kitchen Vitamin D, Ergocalciferol, (DRISDOL) 1.25 MG (50000 UNIT) CAPS capsule TAKE 1 CAPSULE BY MOUTH 1 TIME A WEEK  . [DISCONTINUED] chlorpheniramine-HYDROcodone (TUSSIONEX) 10-8 MG/5ML SUER Take 5 mLs by mouth every 12 (twelve) hours as needed for cough.  . [DISCONTINUED] lidocaine (LIDODERM) 5 % Place 1 patch onto the skin daily. To area of swelling and pain over injection site.  Remove & Discard patch within 12 hours   Facility-Administered Encounter Medications as of 03/26/2020  Medication  . 0.9 %  sodium chloride infusion     REVIEW OF SYSTEMS  : All other systems reviewed and negative except where noted in the History of Present Illness.   PHYSICAL EXAM: BP (!) 144/64   Pulse 70   Ht 5' 7"  (1.702 m)   Wt 229 lb (103.9 kg)   BMI 35.87 kg/m  General: Well developed AA female in no acute distress Head: Normocephalic and atraumatic Eyes:  Sclerae anicteric, conjunctiva pink on the left.  Eye patch was covering the right eye. Ears: Normal auditory acuity Lungs: Clear throughout to auscultation; no W/R/R. Heart: Regular rate and rhythm; no M/R/G. Abdomen: Soft, non-distended.  BS present.  Non-tender. Musculoskeletal: Symmetrical with no gross deformities  Skin: No lesions on visible extremities Extremities: No edema  Neurological: Alert  oriented x 4, grossly non-focal Psychological:  Alert and cooperative. Normal mood and affect  ASSESSMENT AND PLAN: *Elevated LFTs with increase in AST, ALT, and alk phos. Total bili is normal. Was evaluated in early 2019 for elevated LFTs. Ultrasound at that time showed fatty liver and extensive serologic evaluation was negative. LFTs more so elevated recently over the past several months. Repeat CT scan does show extensive hepatic steatosis. This more significant elevation recently is likely secondary to the Verzenio that she is taking for her breast cancer. They are only mildly elevated for now and actually improved on most recent labs 3 days ago. Recommend just continuing to monitor them closely.   CC:  Llc, [employer]*

## 2020-03-26 NOTE — Progress Notes (Signed)
Reviewed and agree with documentation and assessment and plan. K. Veena Endy Easterly , MD   

## 2020-03-30 ENCOUNTER — Other Ambulatory Visit: Payer: Self-pay | Admitting: Student in an Organized Health Care Education/Training Program

## 2020-03-31 ENCOUNTER — Inpatient Hospital Stay: Payer: 59

## 2020-03-31 ENCOUNTER — Other Ambulatory Visit: Payer: Self-pay

## 2020-03-31 ENCOUNTER — Telehealth: Payer: Self-pay | Admitting: *Deleted

## 2020-03-31 ENCOUNTER — Telehealth: Payer: Self-pay

## 2020-03-31 NOTE — Telephone Encounter (Signed)
Spoke with patient regarding MyChart messages about Faslodex injections. She states the first ones are still hardened "knots" in  L/R buttock cheeks and that the skin has a "sunburned" appearance. She states she purchased some OTC creme and it is helping with the sunburned feeling and appearance. She is still concerned about these reactions as she is scheduled to receive additional injections in the future. Message routed to Dr. Irene Limbo

## 2020-03-31 NOTE — Telephone Encounter (Signed)
Patient arrived to [facility] for Faslodex injection.  Per Dr. Irene Limbo note on 03/23/2020, Faslodex given 03/23/2020 with 03/31/2020 injection moved to 1 week out.  TC to [facility], RN with Dr. Irene Limbo to confirm.  Sandi, RN will follow up on order.

## 2020-04-01 ENCOUNTER — Encounter: Payer: Self-pay | Admitting: Pulmonary Disease

## 2020-04-01 ENCOUNTER — Other Ambulatory Visit: Payer: Self-pay

## 2020-04-01 ENCOUNTER — Ambulatory Visit (INDEPENDENT_AMBULATORY_CARE_PROVIDER_SITE_OTHER): Payer: 59 | Admitting: Pulmonary Disease

## 2020-04-01 VITALS — BP 138/78 | HR 100 | Temp 97.5°F | Ht 67.0 in | Wt 227.0 lb

## 2020-04-01 DIAGNOSIS — R918 Other nonspecific abnormal finding of lung field: Secondary | ICD-10-CM

## 2020-04-01 DIAGNOSIS — J984 Other disorders of lung: Secondary | ICD-10-CM | POA: Insufficient documentation

## 2020-04-01 DIAGNOSIS — R0602 Shortness of breath: Secondary | ICD-10-CM

## 2020-04-01 NOTE — Patient Instructions (Signed)
Shortness of breath --Reviewed methacholine challenge which was negative for asthma --Encourage regular aerobic activity  Restrictive defect on PFTs --Order HRCT in November 2021  Pulmonary nodules: RUL 10x17mm, 20mm, unchanged. RLL nodule 57mm new --Order HRCT in November 2021  Follow up after CT is done.

## 2020-04-01 NOTE — Progress Notes (Signed)
Subjective:   PATIENT ID: Brandy [name] GENDER: female DOB: [birth year], MRN: [id number]   HPI  Chief Complaint  Patient presents with  . Follow-up    Reason for Visit: Follow-up for restrictive defect  [name] is a 54 year old female former smoker with hx of breast cancer, obesity, chronic diastolic heart failure, chronic pain, anxiety and insomnia who presents for follow-up  She reports shortness of breath with heavy exertion such as with walking at a brisk pace. She slows down but otherwise able to perform activities of daily living. She does not use the rescue inhaler. She has occasional non-productive cough thought to be related to her cancer treatment. Denies wheezing. She continues to follow with Oncology for breast cancer, currently Verzenio which was started on Oct 2020.   Social History: Quit smoking in 2020. 1/4 ppd x 25 years.   I have personally reviewed patient's past medical/family/social history/allergies/current medications.  Past Medical History:  Diagnosis Date  . Acute pansinusitis 08/02/2017  . Arthritis   . ASD (atrial septal defect)    s/p closure with Amplatzer device 10/05/04 (Dr. Myriam Jacobson, [facility]) 10/05/04  . Cataract   . GERD (gastroesophageal reflux disease)   . Headache   . Heart murmur    no longer heard  . Legally blind in right eye, as defined in [place]   . Lumbar herniated disc   . Sciatica      Allergies  Allergen Reactions  . Zithromax [Azithromycin] Shortness Of Breath and Itching    TOTAL BODY ITCHING [EVEN SOLES OF FEET] WHEEZING   . Psyllium Nausea And Vomiting     Outpatient Medications Prior to Visit  Medication Sig Dispense Refill  . albuterol (PROVENTIL) (2.5 MG/3ML) 0.083% nebulizer solution Take 2.5 mg by nebulization every 6 (six) hours as needed for wheezing or shortness of breath.    . ALPRAZolam (XANAX) 1 MG tablet Take 1 mg by mouth daily as needed for anxiety.     Marland Kitchen amitriptyline (ELAVIL) 75  MG tablet     . benzonatate (TESSALON) 100 MG capsule Take 1 capsule (100 mg total) by mouth 3 (three) times daily as needed for cough. 60 capsule 0  . guaiFENesin (MUCINEX) 600 MG 12 hr tablet Take 1 tablet (600 mg total) by mouth 2 (two) times daily. 60 tablet 2  . Magnesium 500 MG TABS Take 1 tablet (500 mg total) by mouth in the morning and at bedtime. 60 tablet 1  . omeprazole (PRILOSEC) 10 MG capsule Take 20 mg by mouth daily.     . ondansetron (ZOFRAN) 8 MG tablet Take 1 tablet (8 mg total) by mouth every 8 (eight) hours as needed for nausea or vomiting. 30 tablet 3  . tiZANidine (ZANAFLEX) 4 MG tablet Take 1 tablet (4 mg total) by mouth 2 (two) times daily as needed for muscle spasms. 30 tablet 1  . VERZENIO 100 MG tablet TAKE 1 TABLET (100 MG TOTAL) BY MOUTH 2 (TWO) TIMES DAILY. 56 tablet 0  . Vitamin D, Ergocalciferol, (DRISDOL) 1.25 MG (50000 UNIT) CAPS capsule TAKE 1 CAPSULE BY MOUTH 1 TIME A WEEK 12 capsule 3   Facility-Administered Medications Prior to Visit  Medication Dose Route Frequency Provider Last Rate Last Admin  . 0.9 %  sodium chloride infusion  500 mL Intravenous Continuous Nandigam, Kavitha V, MD        Review of Systems  Constitutional: Negative for chills, diaphoresis, fever, malaise/fatigue and weight loss.  HENT:  Negative for congestion.   Respiratory: Positive for cough and shortness of breath. Negative for hemoptysis, sputum production and wheezing.   Cardiovascular: Negative for chest pain, palpitations and leg swelling.    Objective:   Vitals:   04/01/20 0853  BP: 138/78  Pulse: 100  Temp: (!) 97.5 F (36.4 C)  SpO2: 97%  Weight: 227 lb (103 kg)  Height: 5\' 7"  (1.702 m)   SpO2: 97 % (RA) O2 Device: None (Room air)  Physical Exam: General: Well-appearing, no acute distress HENT: [place], AT Eyes: S/p right enucleation, left EOMI, no scleral icterus Respiratory: Clear to auscultation bilaterally.  No crackles, wheezing or rales Cardiovascular: RRR,  -M/R/G, no JVD Extremities:-Edema,-tenderness Neuro: AAO x4, CNII-XII grossly intact Skin: Intact, no rashes or bruising Psych: Normal mood, normal affect  Data Reviewed:  Imaging: CTA 10/30/19 - No pulmonary embolism. RUL 10x76mm, 53mm, unchanged. RLL nodule 14mm new  PFT: 12/18/19 FVC 3.13 (83%) FEV1 2.49 (92%) Ratio 88  TLC 78% DLCO 58% Interpretation: Mild restrictive defect with moderate reduction in gas exchange  02/17/20 Negative methacholine challenge  Echocardiogram: 09/2019 Normal EF, grade I DD, mild MR.     Assessment & Plan:   Discussion: 54 year old female with mild restrictive lung defect pulmonary nodules who presents for follow-up. She has shortness of breath that does not limit her activity. Reviewed PFTs which is negative for obstructive defect. She does not have COPD or asthma. Mild restrictive defect is present. Last CT showed RLL which is new and unchanged >1cm nodule. Will obtain HRCT to evaluate restrictive defect and lung nodules.  Shortness of breath -Reviewed methacholine challenge which was negative for asthma -Encourage regular aerobic activity  Restrictive defect on PFTs --Order HRCT in November 2021  Pulmonary nodules: RUL 10x59mm, 62mm, unchanged. RLL nodule 18mm new --Order HRCT in November 2021  Health Maintenance Immunization History  Administered Date(s) Administered  . Influenza Inj Mdck Quad Pf 04/25/2019  . Influenza,inj,Quad PF,6+ Mos 02/24/2020  . Influenza,inj,quad, With Preservative 04/25/2019  . Moderna SARS-COVID-2 Vaccination 06/29/2019, 08/03/2019  . Pneumococcal Conjugate-13 05/29/2019  . Pneumococcal Polysaccharide-23 07/29/2019   CT Lung Screen - not qualified  Orders Placed This Encounter  Procedures  . CT Chest High Resolution    Within 3 months and before next follow up    Standing Status:   Future    Standing Expiration Date:   04/01/2021    Order Specific Question:   Is patient pregnant?    Answer:   No    Order  Specific Question:   Preferred imaging location?    Answer:   [place]  No orders of the defined types were placed in this encounter.   Return for CT chest review.  I have spent a total time of 31-minutes on the day of the appointment reviewing prior documentation, coordinating care and discussing medical diagnosis and plan with the patient/family. Imaging, labs and tests included in this note have been reviewed and interpreted independently by me.  Seymour, MD [facility] 04/01/2020 9:15 AM  Office Number [phone]

## 2020-04-02 ENCOUNTER — Other Ambulatory Visit: Payer: Self-pay | Admitting: *Deleted

## 2020-04-06 ENCOUNTER — Inpatient Hospital Stay: Payer: 59 | Attending: Hematology

## 2020-04-06 ENCOUNTER — Other Ambulatory Visit: Payer: Self-pay

## 2020-04-06 VITALS — BP 149/80 | HR 97 | Temp 99.3°F | Resp 20

## 2020-04-06 DIAGNOSIS — K760 Fatty (change of) liver, not elsewhere classified: Secondary | ICD-10-CM | POA: Insufficient documentation

## 2020-04-06 DIAGNOSIS — Z79899 Other long term (current) drug therapy: Secondary | ICD-10-CM | POA: Diagnosis not present

## 2020-04-06 DIAGNOSIS — Z8249 Family history of ischemic heart disease and other diseases of the circulatory system: Secondary | ICD-10-CM | POA: Diagnosis not present

## 2020-04-06 DIAGNOSIS — Z833 Family history of diabetes mellitus: Secondary | ICD-10-CM | POA: Insufficient documentation

## 2020-04-06 DIAGNOSIS — Z87891 Personal history of nicotine dependence: Secondary | ICD-10-CM | POA: Diagnosis not present

## 2020-04-06 DIAGNOSIS — Z801 Family history of malignant neoplasm of trachea, bronchus and lung: Secondary | ICD-10-CM | POA: Insufficient documentation

## 2020-04-06 DIAGNOSIS — C50811 Malignant neoplasm of overlapping sites of right female breast: Secondary | ICD-10-CM | POA: Insufficient documentation

## 2020-04-06 DIAGNOSIS — C7802 Secondary malignant neoplasm of left lung: Secondary | ICD-10-CM | POA: Insufficient documentation

## 2020-04-06 DIAGNOSIS — M5126 Other intervertebral disc displacement, lumbar region: Secondary | ICD-10-CM | POA: Insufficient documentation

## 2020-04-06 DIAGNOSIS — R7989 Other specified abnormal findings of blood chemistry: Secondary | ICD-10-CM | POA: Diagnosis not present

## 2020-04-06 DIAGNOSIS — Z5111 Encounter for antineoplastic chemotherapy: Secondary | ICD-10-CM | POA: Insufficient documentation

## 2020-04-06 DIAGNOSIS — C7801 Secondary malignant neoplasm of right lung: Secondary | ICD-10-CM | POA: Diagnosis not present

## 2020-04-06 DIAGNOSIS — C50919 Malignant neoplasm of unspecified site of unspecified female breast: Secondary | ICD-10-CM

## 2020-04-06 MED ORDER — FULVESTRANT 250 MG/5ML IM SOLN
500.0000 mg | Freq: Once | INTRAMUSCULAR | Status: AC
  Administered 2020-04-06: 500 mg via INTRAMUSCULAR

## 2020-04-06 MED ORDER — FULVESTRANT 250 MG/5ML IM SOLN
INTRAMUSCULAR | Status: AC
  Filled 2020-04-06: qty 10

## 2020-04-06 NOTE — Patient Instructions (Signed)
Fulvestrant injection What is this medicine? FULVESTRANT (ful VES trant) blocks the effects of estrogen. It is used to treat breast cancer. This medicine may be used for other purposes; ask your health care provider or pharmacist if you have questions. COMMON BRAND NAME(S): FASLODEX What should I tell my health care provider before I take this medicine? They need to know if you have any of these conditions:  bleeding disorders  liver disease  low blood counts, like low white cell, platelet, or red cell counts  an unusual or allergic reaction to fulvestrant, other medicines, foods, dyes, or preservatives  pregnant or trying to get pregnant  breast-feeding How should I use this medicine? This medicine is for injection into a muscle. It is usually given by a health care professional in a hospital or clinic setting. Talk to your pediatrician regarding the use of this medicine in children. Special care may be needed. Overdosage: If you think you have taken too much of this medicine contact a poison control center or emergency room at once. NOTE: This medicine is only for you. Do not share this medicine with others. What if I miss a dose? It is important not to miss your dose. Call your doctor or health care professional if you are unable to keep an appointment. What may interact with this medicine?  medicines that treat or prevent blood clots like warfarin, enoxaparin, dalteparin, apixaban, dabigatran, and rivaroxaban This list may not describe all possible interactions. Give your health care provider a list of all the medicines, herbs, non-prescription drugs, or dietary supplements you use. Also tell them if you smoke, drink alcohol, or use illegal drugs. Some items may interact with your medicine. What should I watch for while using this medicine? Your condition will be monitored carefully while you are receiving this medicine. You will need important blood work done while you are taking  this medicine. Do not become pregnant while taking this medicine or for at least 1 year after stopping it. Women of child-bearing potential will need to have a negative pregnancy test before starting this medicine. Women should inform their doctor if they wish to become pregnant or think they might be pregnant. There is a potential for serious side effects to an unborn child. Men should inform their doctors if they wish to father a child. This medicine may lower sperm counts. Talk to your health care professional or pharmacist for more information. Do not breast-feed an infant while taking this medicine or for 1 year after the last dose. What side effects may I notice from receiving this medicine? Side effects that you should report to your doctor or health care professional as soon as possible:  allergic reactions like skin rash, itching or hives, swelling of the face, lips, or tongue  feeling faint or lightheaded, falls  pain, tingling, numbness, or weakness in the legs  signs and symptoms of infection like fever or chills; cough; flu-like symptoms; sore throat  vaginal bleeding Side effects that usually do not require medical attention (report to your doctor or health care professional if they continue or are bothersome):  aches, pains  constipation  diarrhea  headache  hot flashes  nausea, vomiting  pain at site where injected  stomach pain This list may not describe all possible side effects. Call your doctor for medical advice about side effects. You may report side effects to FDA at 1-800-FDA-1088. Where should I keep my medicine? This drug is given in a hospital or clinic and will   not be stored at home. NOTE: This sheet is a summary. It may not cover all possible information. If you have questions about this medicine, talk to your doctor, pharmacist, or health care provider.  2020 Elsevier/Gold Standard (2017-08-31 11:34:41)  

## 2020-04-09 ENCOUNTER — Other Ambulatory Visit: Payer: Self-pay | Admitting: Hematology

## 2020-04-09 ENCOUNTER — Encounter: Payer: Self-pay | Admitting: Hematology

## 2020-04-09 MED ORDER — HYDROCODONE-ACETAMINOPHEN 5-325 MG PO TABS
1.0000 | ORAL_TABLET | Freq: Four times a day (QID) | ORAL | 0 refills | Status: DC | PRN
Start: 2020-04-09 — End: 2020-11-12

## 2020-04-10 ENCOUNTER — Telehealth: Payer: Self-pay | Admitting: Hematology

## 2020-04-10 NOTE — Telephone Encounter (Signed)
Scheduled appt per 11/4 sch msg - unable to reach pt . Left message for patient with appt date and time

## 2020-04-13 ENCOUNTER — Other Ambulatory Visit: Payer: Self-pay

## 2020-04-15 ENCOUNTER — Inpatient Hospital Stay (HOSPITAL_BASED_OUTPATIENT_CLINIC_OR_DEPARTMENT_OTHER): Payer: 59 | Admitting: Hematology

## 2020-04-15 ENCOUNTER — Other Ambulatory Visit: Payer: Self-pay

## 2020-04-15 VITALS — BP 132/56 | HR 103 | Temp 96.7°F | Resp 18 | Ht 67.0 in | Wt 228.6 lb

## 2020-04-15 DIAGNOSIS — Z5111 Encounter for antineoplastic chemotherapy: Secondary | ICD-10-CM | POA: Diagnosis not present

## 2020-04-15 DIAGNOSIS — L0231 Cutaneous abscess of buttock: Secondary | ICD-10-CM | POA: Diagnosis not present

## 2020-04-15 DIAGNOSIS — C50919 Malignant neoplasm of unspecified site of unspecified female breast: Secondary | ICD-10-CM

## 2020-04-15 DIAGNOSIS — R945 Abnormal results of liver function studies: Secondary | ICD-10-CM

## 2020-04-15 DIAGNOSIS — R7989 Other specified abnormal findings of blood chemistry: Secondary | ICD-10-CM

## 2020-04-15 MED ORDER — CLINDAMYCIN HCL 300 MG PO CAPS: 300.0000 mg | ORAL_CAPSULE | Freq: Three times a day (TID) | ORAL | 0 refills | Status: AC

## 2020-04-15 MED ORDER — MUPIROCIN 2 % EX OINT: 1.0000 "application " | TOPICAL_OINTMENT | Freq: Two times a day (BID) | CUTANEOUS | 1 refills | Status: DC

## 2020-04-15 NOTE — Progress Notes (Signed)
HEMATOLOGY/ONCOLOGY CLINIC NOTE  Date of Service: 04/15/2020  Patient Care Team: [facility] as PCP - General Everardo Beals, NP as Nurse Practitioner [facility]  CHIEF COMPLAINTS/PURPOSE OF CONSULTATION:  -f/u for metastatic breast cancer   HISTORY OF PRESENTING ILLNESS:  Brandy [name] is a wonderful 54 y.o. female who has been referred to [place] by [facility] for evaluation and management of her suspected breast cancer.   The pt reports that she was having increasing low back pain on her right side that would not resolve, so she went to the ED. This pain has not resolved. She notes pain in her hips and knees.   Of note prior to the patient's visit today, pt has had an abdominal limited right upper ultrasound completed on 02/18/2019 with results revealing "Probable hepatic steatosis. No other abnormality seen in the right upper quadrant of the abdomen."  Pt had abdomen and pelvis CT completed on 02/18/2019 with results revealing "5cm spiculated soft tissue mass in inferior right breast, highly suspicious for primary breast carcinoma. No acute findings or metastatic disease within the abdomen or pelvis. Multiple small pulmonary nodules in both lung bases, consistent with pulmonary metastases. 4.5 cm uterine fibroid."  Most recent lab results (02/18/2019) of CBC is as follows: all values are WNL except for glucose at 121, calcium at 8.7, AST at 49, and ALT at 53.  On review of systems, pt reports pedal edema and denies chest pain, shortness of breath, weight loss, and any other symptoms.  Her last known mammography was on 06/18/2012.   On Social Hx the pt reports that she is a smoker and she smokes about half a pack per day. She does not drink alcohol. She has three children: age 49, 70, and 62.  On Family Hx the pt reports lung cancer. Maternal Cousin passed from stomach cancer at the age of 48.    INTERVAL HISTORY:  Brandy [name] is a 54 y.o. female here for evaluation and management of metastatic breast cancer. The patient's last visit with [place] was on 03/23/2020. The pt reports that she is doing well overall.  The pt reports that the last time she received her Faslodex injections her left buttock healed well, but her right buttock has continued to swell and be extremely painful. Pt notes that placing burn relief cream on the area improves the pain somewhat. This discomfort begins about two hours after each injections.   She has a CT Chest scheduled for next Tuesday. Pt continues to experience occasional coughing   On review of systems, pt reports buttock swelling/discomfort, occasional cough and denies breast pain, SOB, leg swelling, chills any other symptoms.   MEDICAL HISTORY:  Past Medical History:  Diagnosis Date  . Acute pansinusitis 08/02/2017  . Arthritis   . ASD (atrial septal defect)    s/p closure with Amplatzer device 10/05/04 (Dr. Myriam Jacobson, [facility]) 10/05/04  . Cataract   . GERD (gastroesophageal reflux disease)   . Headache   . Heart murmur    no longer heard  . Legally blind in right eye, as defined in [place]   . Lumbar herniated disc   . Sciatica   Uterine Fibroids  SURGICAL HISTORY: Past Surgical History:  Procedure Laterality Date  . ABLATION    . BREAST SURGERY Bilateral 2011   Breast Reduction Surgery  . BUNIONECTOMY    . CARDIAC CATHETERIZATION     10/05/04 [facility]): LM < 25%, otherwise  normal coronaries. No pulmonary HTN, Mildly enlarged RV. Secundum ASD s/p closure.  Marland Kitchen CARDIAC SURGERY    . CATARACT EXTRACTION    . CLEFT PALATE REPAIR     s/p cleft lip and palate repair  . LUMBAR LAMINECTOMY/DECOMPRESSION MICRODISCECTOMY Left 05/23/2016   Procedure: LEFT L4-L5 LATERAL RECESS DECOMPRESSION WITH CENTRAL AND RIGHT DECOMPRESSION VIA LEFT SIDE;  Surgeon: [name], MD;  Location: [facility];  Service: Orthopedics;  Laterality: Left;  . LUMBAR LAMINECTOMY/DECOMPRESSION  MICRODISCECTOMY Left 05/23/2016   Procedure: LUMBAR LAMINECTOMY/DECOMPRESSION MICRODISCECTOMY Lumbar five - Sacral One 1 LEVEL;  Surgeon: [name], MD;  Location: [facility];  Service: Orthopedics;  Laterality: Left;  . SHOULDER INJECTION Left 05/23/2016   Procedure: SHOULDER INJECTION;  Surgeon: [name], MD;  Location: [facility];  Service: Orthopedics;  Laterality: Left;  band-aid per pa-c  . TRANSTHORACIC ECHOCARDIOGRAM     12/15/05 [facility]): Mild LVH, EF > 62%, grade 1 diastolic dysfunction, Trivial MR/PR/TR.  Endometrial ablation 2003 Breast Reduction, bilateral 2011  SOCIAL HISTORY: Social History   Socioeconomic History  . Marital status: Married    Spouse name: Not on file  . Number of children: 3  . Years of education: Not on file  . Highest education level: Not on file  Occupational History  . Not on file  Tobacco Use  . Smoking status: Former Smoker    Packs/day: 0.25    Years: 25.00    Pack years: 6.25    Types: Cigarettes    Quit date: 02/05/2019    Years since quitting: 1.1  . Smokeless tobacco: Never Used  Vaping Use  . Vaping Use: Never used  Substance and Sexual Activity  . Alcohol use: No  . Drug use: No  . Sexual activity: Not on file  Other Topics Concern  . Not on file  Social History Narrative  . Not on file   Social Determinants of Health   Financial Resource Strain:   . Difficulty of Paying Living Expenses: Not on file  Food Insecurity:   . Worried About Charity fundraiser in the Last Year: Not on file  . Ran Out of Food in the Last Year: Not on file  Transportation Needs:   . Lack of Transportation (Medical): Not on file  . Lack of Transportation (Non-Medical): Not on file  Physical Activity:   . Days of Exercise per Week: Not on file  . Minutes of Exercise per Session: Not on file  Stress:   . Feeling of Stress : Not on file  Social Connections:   . Frequency of Communication with Friends and Family: Not on file  . Frequency of Social  Gatherings with Friends and Family: Not on file  . Attends Religious Services: Not on file  . Active Member of Clubs or Organizations: Not on file  . Attends Archivist Meetings: Not on file  . Marital Status: Not on file  Intimate Partner Violence:   . Fear of Current or Ex-Partner: Not on file  . Emotionally Abused: Not on file  . Physically Abused: Not on file  . Sexually Abused: Not on file    FAMILY HISTORY: Family History  Problem Relation Age of Onset  . Diabetes Mother   . High blood pressure Mother   . Cancer Father        Lung  . Colon cancer Neg Hx   . Colon polyps Neg Hx   . Esophageal cancer Neg Hx   . Rectal  cancer Neg Hx   . Stomach cancer Neg Hx     ALLERGIES:  is allergic to zithromax [azithromycin] and psyllium.  MEDICATIONS:  Current Outpatient Medications  Medication Sig Dispense Refill  . albuterol (PROVENTIL) (2.5 MG/3ML) 0.083% nebulizer solution Take 2.5 mg by nebulization every 6 (six) hours as needed for wheezing or shortness of breath.    . ALPRAZolam (XANAX) 1 MG tablet Take 1 mg by mouth daily as needed for anxiety.     Marland Kitchen amitriptyline (ELAVIL) 75 MG tablet     . benzonatate (TESSALON) 100 MG capsule Take 1 capsule (100 mg total) by mouth 3 (three) times daily as needed for cough. 60 capsule 0  . clindamycin (CLEOCIN) 300 MG capsule Take 1 capsule (300 mg total) by mouth 3 (three) times daily for 7 days. Plz take probiotics over the counter to reduce risk of antibiotic related diarrhea. 21 capsule 0  . guaiFENesin (MUCINEX) 600 MG 12 hr tablet Take 1 tablet (600 mg total) by mouth 2 (two) times daily. 60 tablet 2  . HYDROcodone-acetaminophen (NORCO) 5-325 MG tablet Take 1-2 tablets by mouth every 6 (six) hours as needed for moderate pain. 30 tablet 0  . Magnesium 500 MG TABS Take 1 tablet (500 mg total) by mouth in the morning and at bedtime. 60 tablet 1  . mupirocin ointment (BACTROBAN) 2 % Apply 1 application topically 2 (two) times  daily. To area over rt gluteal region 22 g 1  . omeprazole (PRILOSEC) 10 MG capsule Take 20 mg by mouth daily.     . ondansetron (ZOFRAN) 8 MG tablet Take 1 tablet (8 mg total) by mouth every 8 (eight) hours as needed for nausea or vomiting. 30 tablet 3  . VERZENIO 100 MG tablet TAKE 1 TABLET (100 MG TOTAL) BY MOUTH 2 (TWO) TIMES DAILY. 56 tablet 0  . Vitamin D, Ergocalciferol, (DRISDOL) 1.25 MG (50000 UNIT) CAPS capsule TAKE 1 CAPSULE BY MOUTH 1 TIME A WEEK 12 capsule 3   Current Facility-Administered Medications  Medication Dose Route Frequency Provider Last Rate Last Admin  . 0.9 %  sodium chloride infusion  500 mL Intravenous Continuous Nandigam, Venia Minks, MD        REVIEW OF SYSTEMS:   A 10+ POINT REVIEW OF SYSTEMS WAS OBTAINED including neurology, dermatology, psychiatry, cardiac, respiratory, lymph, extremities, GI, GU, Musculoskeletal, constitutional, breasts, reproductive, HEENT.  All pertinent positives are noted in the HPI.  All others are negative.   PHYSICAL EXAMINATION: ECOG FS:1 - Symptomatic but completely ambulatory  Vitals:   04/15/20 1347  BP: (!) 132/56  Pulse: (!) 103  Resp: 18  Temp: (!) 96.7 F (35.9 C)  SpO2: 100%   Wt Readings from Last 3 Encounters:  04/15/20 228 lb 9.6 oz (103.7 kg)  04/01/20 227 lb (103 kg)  03/26/20 229 lb (103.9 kg)   Body mass index is 35.8 kg/m.    GENERAL:alert, in no acute distress and comfortable, buttock induration,  SKIN: no acute rashes, no significant lesions, left buttock is draining purulent discharge EYES: conjunctiva are pink and non-injected, sclera anicteric OROPHARYNX: MMM, no exudates, no oropharyngeal erythema or ulceration NECK: supple, no JVD LYMPH:  no palpable lymphadenopathy in the cervical, axillary or inguinal regions LUNGS: clear to auscultation b/l with normal respiratory effort HEART: regular rate & rhythm ABDOMEN:  normoactive bowel sounds , non tender, not distended. No palpable hepatosplenomegaly.   Extremity: no pedal edema PSYCH: alert & oriented x 3 with fluent speech NEURO: no focal  motor/sensory deficits  LABORATORY DATA:  I have reviewed the data as listed  . CBC Latest Ref Rng & Units 03/23/2020 02/24/2020 12/20/2019  WBC 4.0 - 10.5 K/uL 3.2(L) 6.0 5.1  Hemoglobin 12.0 - 15.0 g/dL 11.6(L) 12.1 11.4(L)  Hematocrit 36 - 46 % 34.9(L) 37.6 35.1(L)  Platelets 150 - 400 K/uL 284 297 293    . CMP Latest Ref Rng & Units 03/23/2020 02/24/2020 12/20/2019  Glucose 70 - 99 mg/dL 90 127(H) 160(H)  BUN 6 - 20 mg/dL _0 Creatinine 0.44 - 1.00 mg/dL 1.34(H) 1.16(H) 1.12(H)  Sodium 135 - 145 mmol/L 142 140 140  Potassium 3.5 - 5.1 mmol/L 4.0 4.1 3.7  Chloride 98 - 111 mmol/L 106 102 104  CO2 22 - 32 mmol/L 29 32 24  Calcium 8.9 - 10.3 mg/dL 9.5 9.2 9.3  Total Protein 6.5 - 8.1 g/dL 7.4 7.3 7.5  Total Bilirubin 0.3 - 1.2 mg/dL 0.2(L) 0.5 0.6  Alkaline Phos 38 - 126 U/L 159(H) 192(H) 149(H)  AST 15 - 41 U/L 67(H) 108(H) 61(H)  ALT 0 - 44 U/L 72(H) 174(H) 79(H)   Component     Latest Ref Rng & Units 03/08/2019 03/22/2019  FSH     mIU/mL 65.5   LH     mIU/mL 46.6   Estrogen     pg/mL 82   Progesterone     ng/mL <0.1   Estradiol     pg/mL  <5.0        RADIOGRAPHIC STUDIES: I have personally reviewed the radiological images as listed and agreed with the findings in the report. No results found.  ASSESSMENT & PLAN:   Brandy [name] is a 54 y.o. female with:  1. Metastatic breast cancer ER+/PRneg/Her2 neg  02/28/2019 neck CT with results revealing "negative for mass or adenopathy in the neck."  03/04/2019 head MRI with results revealing "Negative for metastatic disease.  No acute abnormality in the brain."  2. Likely Pulmonary metastases  02/18/2019 chest and abdomen with results revealing "5cm spiculated soft tissue mass in inferior right breast, highly suspicious for primary breast carcinoma. No acute findings or metastatic disease within the abdomen or  pelvis. Multiple small pulmonary nodules in both lung bases, consistent with pulmonary metastases. 4.5 cm uterine fibroid."  02/28/2019 C/A/P CT with results revealing "Irregular solid 5.0 cm right breast mass, suspicious for primary right breast malignancy. Innumerable solid pulmonary nodules scattered throughout both lungs, compatible with pulmonary metastases. No evidence of metastatic disease in the abdomen, pelvis or skeleton. Mildly enlarged and probably myomatous uterus. Simple 1.4 cm left adnexal cyst requires no follow-up. This recommendation follows ACR consensus guidelines: White Paper of the ACR Incidental Findings Committee II on Adnexal Findings. J Am Coll Radiol [phone]. Aortic Atherosclerosis (ICD10-I70.0)."  NUCLEAR MEDICINE WHOLE BODY BONE SCAN completed on 03/19/2019 with results revealing "1. No scintigraphic evidence skeletal metastasis. 2. Degenerative bone disease in the posterior elements of the upper and mid lumbar spine."   04/09/2019 Bone Density ([id number]) which revealed "The BMD measured at Femur Neck Right is 1.153 g/cm2 with a T-score of 0.8. This patient is considered normal according to [facility]) criteria. Lumbar spine was not utilized due to advanced degenerative changes. The scan quality is good. Femur Neck Right 04/09/2019 54.7 Normal 0.8 1.153 g/cm2. Left Forearm Radius 33% 04/09/2019 54.7 Normal 0.8 0.949 g/cm2."  08/04/2019 CT Angio Chest ([id number]) revealed "1. No lobar or central pulmonary embolus detected. Exam is limited secondary to respiratory  motion. 2. Mild ground-glass attenuation may represent mild pneumonitis or areas of air trapping. 3. Signs of atrial septal closure. 4. Decrease in size and number of bilateral pulmonary nodules, marked response noted on today's exam with the only nodule remaining near a cm in the right upper lobe and the smaller nodules that were present on the previous examination throughout the chest no  longer measurable though the lower lobes are limited by respiratory motion. 5. Decreased size of right breast mass. 6. Probable hepatic steatosis."  3. Abnormal Liver function test -- previous? Fatty liver 07/03/2019 [place] Abd ([id number]) revealed "1. No acute findings. Normal gallbladder. No bile duct dilation. 2. Significant increased liver parenchymal echogenicity consistent with extensive hepatic steatosis."  PLAN: -No lab or clinical evidence of Metastatic Breast Cancer progression at this time. -The pt has no prohibitive toxicities from continuing 100 mg Verzenio BID. -Advised pt that it is possible that a hematoma resulted after her injection that caused an infection.  -Advised pt that it is also possible that she is having an inflammation after her injections that is also causing an infection. -Will work with our team to find the best way of administering pt's Faslodex injections to minimize reactions - pt would like to continue.  -Discussed referral to Surgery for lancing evaluation - pt declines. -Recommend warm-compress for her right buttock.  -Recommend pt f/u for CT Chest as scheduled. -Rx Clindamycin, Mupirocin -Will se back in 3 weeks with labs    FOLLOW UP: F/u as per scheduled appointments on 12/2.   The total time spent in the appt was 25 minutes and more than 50% was on counseling and direct patient cares.  All of the patient's questions were answered with apparent satisfaction. The patient knows to call the clinic with any problems, questions or concerns.   Sullivan Lone MD [place] [facility] [place] Hematology/Oncology Physician [facility]  (Office):       [phone] (Work cell):  [phone] (Fax):           [phone]  04/15/2020 2:43 PM  I, Yevette Edwards, am acting as a scribe for Dr. Sullivan Lone.   .I have reviewed the above documentation for accuracy and completeness, and I agree with the above.  Brunetta Genera MD

## 2020-04-20 ENCOUNTER — Encounter: Payer: Self-pay | Admitting: Hematology

## 2020-04-21 ENCOUNTER — Emergency Department (HOSPITAL_COMMUNITY)
Admission: EM | Admit: 2020-04-21 | Discharge: 2020-04-21 | Disposition: A | Discharge status: Home / Self Care | Payer: 59 | Attending: Emergency Medicine | Admitting: Emergency Medicine

## 2020-04-21 ENCOUNTER — Encounter: Payer: Self-pay | Admitting: Student in an Organized Health Care Education/Training Program

## 2020-04-21 ENCOUNTER — Other Ambulatory Visit: Payer: Self-pay

## 2020-04-21 ENCOUNTER — Inpatient Hospital Stay: Admission: RE | Admit: 2020-04-21 | Payer: 59 | Source: Ambulatory Visit

## 2020-04-21 ENCOUNTER — Encounter (HOSPITAL_COMMUNITY): Payer: Self-pay

## 2020-04-21 ENCOUNTER — Encounter: Payer: Self-pay | Admitting: Hematology

## 2020-04-21 ENCOUNTER — Telehealth: Payer: Self-pay | Admitting: *Deleted

## 2020-04-21 DIAGNOSIS — R000 Tachycardia, unspecified: Secondary | ICD-10-CM | POA: Insufficient documentation

## 2020-04-21 DIAGNOSIS — L0231 Cutaneous abscess of buttock: Secondary | ICD-10-CM | POA: Diagnosis present

## 2020-04-21 DIAGNOSIS — N183 Chronic kidney disease, stage 3 unspecified: Secondary | ICD-10-CM | POA: Diagnosis not present

## 2020-04-21 DIAGNOSIS — Z853 Personal history of malignant neoplasm of breast: Secondary | ICD-10-CM | POA: Diagnosis not present

## 2020-04-21 DIAGNOSIS — I129 Hypertensive chronic kidney disease with stage 1 through stage 4 chronic kidney disease, or unspecified chronic kidney disease: Secondary | ICD-10-CM | POA: Diagnosis not present

## 2020-04-21 DIAGNOSIS — Z87891 Personal history of nicotine dependence: Secondary | ICD-10-CM | POA: Diagnosis not present

## 2020-04-21 HISTORY — DX: Malignant (primary) neoplasm, unspecified: C80.1

## 2020-04-21 LAB — CBC WITH DIFFERENTIAL/PLATELET
Abs Immature Granulocytes: 0.01 10*3/uL (ref 0.00–0.07)
Basophils Absolute: 0 10*3/uL (ref 0.0–0.1)
Basophils Relative: 1 %
Eosinophils Absolute: 0.1 10*3/uL (ref 0.0–0.5)
Eosinophils Relative: 2 %
HCT: 31.4 % — ABNORMAL LOW (ref 36.0–46.0)
Hemoglobin: 10.4 g/dL — ABNORMAL LOW (ref 12.0–15.0)
Immature Granulocytes: 0 %
Lymphocytes Relative: 36 %
Lymphs Abs: 1.6 10*3/uL (ref 0.7–4.0)
MCH: 33.1 pg (ref 26.0–34.0)
MCHC: 33.1 g/dL (ref 30.0–36.0)
MCV: 100 fL (ref 80.0–100.0)
Monocytes Absolute: 0.3 10*3/uL (ref 0.1–1.0)
Monocytes Relative: 6 %
Neutro Abs: 2.5 10*3/uL (ref 1.7–7.7)
Neutrophils Relative %: 55 %
Platelets: 298 10*3/uL (ref 150–400)
RBC: 3.14 MIL/uL — ABNORMAL LOW (ref 3.87–5.11)
RDW: 16.3 % — ABNORMAL HIGH (ref 11.5–15.5)
WBC: 4.5 10*3/uL (ref 4.0–10.5)
nRBC: 0 % (ref 0.0–0.2)

## 2020-04-21 LAB — BASIC METABOLIC PANEL
Anion gap: 11 (ref 5–15)
BUN: 13 mg/dL (ref 6–20)
CO2: 23 mmol/L (ref 22–32)
Calcium: 8.6 mg/dL — ABNORMAL LOW (ref 8.9–10.3)
Chloride: 105 mmol/L (ref 98–111)
Creatinine, Ser: 1.35 mg/dL — ABNORMAL HIGH (ref 0.44–1.00)
GFR, Estimated: 46 mL/min — ABNORMAL LOW (ref >60)
Glucose, Bld: 132 mg/dL — ABNORMAL HIGH (ref 70–99)
Potassium: 3.5 mmol/L (ref 3.5–5.1)
Sodium: 139 mmol/L (ref 135–145)

## 2020-04-21 LAB — LACTIC ACID, PLASMA
Lactic Acid, Venous: 2.8 mmol/L (ref 0.5–1.9)
Lactic Acid, Venous: 2.2 mmol/L (ref 0.5–1.9)

## 2020-04-21 MED ORDER — SODIUM CHLORIDE 0.9 % IV SOLN
100.0000 mg | Freq: Once | INTRAVENOUS | Status: AC
  Administered 2020-04-21: 100 mg via INTRAVENOUS
  Filled 2020-04-21: qty 100

## 2020-04-21 MED ORDER — LIDOCAINE-EPINEPHRINE (PF) 2 %-1:200000 IJ SOLN
10.0000 mL | Freq: Once | INTRAMUSCULAR | Status: AC
  Administered 2020-04-21: 10 mL
  Filled 2020-04-21: qty 20

## 2020-04-21 MED ORDER — OXYCODONE-ACETAMINOPHEN 5-325 MG PO TABS
1.0000 | ORAL_TABLET | Freq: Once | ORAL | Status: AC
  Administered 2020-04-21: 1 via ORAL
  Filled 2020-04-21: qty 1

## 2020-04-21 MED ORDER — DOXYCYCLINE HYCLATE 100 MG PO CAPS: 100.0000 mg | ORAL_CAPSULE | Freq: Two times a day (BID) | ORAL | 0 refills | Status: AC

## 2020-04-21 MED ORDER — SODIUM CHLORIDE 0.9 % IV BOLUS
1000.0000 mL | Freq: Once | INTRAVENOUS | Status: AC
  Administered 2020-04-21: 1000 mL via INTRAVENOUS

## 2020-04-21 NOTE — ED Triage Notes (Signed)
Patient reports that she has been receiving anti hormonal injections in her buttocks due to stage IV breast cancer. Patient states she developed an abscess to the right buttock last week and was prescribed Clindamycin and an ointment. Patient states she has been having large amounts of drainage, but her physician thinks the area has tunneled.

## 2020-04-21 NOTE — Discharge Instructions (Addendum)
Please pick up antibiotics and take as prescribed  It is recommended by Dr. Irene Limbo that you hold off on your verzenio until this abscess clears   Apply warm compresses to the area. Take pain medication as needed. Please follow up with [facility] for further evaluation as recommended by Dr. Irene Limbo. Call to schedule an appointment. I have also placed an ambulatory referral to wound care.   If you are unable to see surgery in the next 2-3 days please return to the ED for wound check and for packing removal

## 2020-04-21 NOTE — ED Provider Notes (Signed)
[place] DEPT Provider Note   CSN: [id number] Arrival date & time: 04/21/20  1004     History Chief Complaint  Patient presents with  . Abscess    Brandy [name] is a 54 y.o. female with PMHx metastatic breast cancer who presents to the ED with complaint of right buttock abscess s/2 receiving fulvestrant injection. Pt reports that she began receiving this medication about 1 month ago; after the first injection she felt a "knot" to the area; she received a second injection 2 weeks ago and since then the knot has become bigger with increasing pain and redness. She was placed on clindamycin, mupirocin, and provided pain medication without relief. She called her oncologist Dr. Irene Limbo back yesterday due to area draining brown/serosangiunous fluid and was told today to come to the ED for further evaluation (see telephone note below).   Rolland Bimler, RN  04/21/20 10:05 AM Note Patient called 11/15 @ 1630 to report: Right buttock area very hard on sides. Abcess is draining a lot - brown/serosanguinous. Still taking antibiotic and using creme. She thinks it is tunneling. Low grade temp 99 at times. Pain medicine only helping some. She wanted to know if Dr. Irene Limbo thought it was time to refer to surgeon as discussed in appointment on 11/10. Dr. Irene Limbo informed.  Contacted patient with MD response: Would recommend going to ED for IV antibiotics and urgent surgical consideration today. Will need labs to see if she is neutropenic and possibly hold verzenio if ANC low and might need consideration of granix if neutropenic. Patient verbalized understanding and states she will go this morning. States area on buttocks getting harder and drainage has slowed - she thinks it may be pooling inside the wound.        The history is provided by the patient and medical records.       Past Medical History:  Diagnosis Date  . Acute pansinusitis 08/02/2017  . Arthritis    . ASD (atrial septal defect)    s/p closure with Amplatzer device 10/05/04 (Dr. Myriam Jacobson, [facility]) 10/05/04  . Cancer ([place])   . Cataract   . GERD (gastroesophageal reflux disease)   . Headache   . Heart murmur    no longer heard  . Legally blind in right eye, as defined in [place]   . Lumbar herniated disc   . Sciatica     Patient Active Problem List   Diagnosis Date Noted  . Restrictive lung disease 04/01/2020  . Pulmonary nodules 04/01/2020  . Elevated LFTs 03/26/2020  . Metastatic breast cancer ([place]) 02/24/2020  . Spondylosis without myelopathy or radiculopathy, lumbosacral region 12/31/2019  . Chronic low back pain (Bilateral) w/o sciatica 12/31/2019  . Chronic hip pain (3ry area of Pain) (Bilateral) (R>L) 11/27/2019  . Chronic sacroiliac joint pain (Left) 11/27/2019  . Chronic pain syndrome 11/11/2019  . Pharmacologic therapy 11/11/2019  . Disorder of skeletal system 11/11/2019  . Problems influencing health status 11/11/2019  . Abnormal MRI, lumbar spine (08/22/2018) 11/11/2019  . Chronic low back pain (1ry area of Pain) (Bilateral) w/ sciatica (Bilateral) 11/11/2019  . DDD (degenerative disc disease), lumbosacral 11/11/2019  . Grade 1 Anterolisthesis (L2-3, L3-4) 11/11/2019  . Grade 1 Retrolisthesis (L4-5, L5-S1) 11/11/2019  . Lumbar facet hypertrophy (Multilevel) (Bilateral) 11/11/2019  . Lumbar facet syndrome (Multilevel) (Bilateral) (R>L) 11/11/2019  . Chronic lower extremity pain (2ry area of Pain) (Bilateral) (R>L) 11/11/2019  . Neurogenic pain 11/11/2019  . Chronic musculoskeletal pain 11/11/2019  .  Infiltrating ductal carcinoma of breast ([place]) 10/08/2019  . Diarrhea 09/04/2019  . Fatty liver 09/04/2019  . Nausea & vomiting 09/04/2019  . Obesity (BMI 30-39.9) 09/04/2019  . GERD (gastroesophageal reflux disease)   . Hyperlipidemia   . CKD (chronic kidney disease), stage III ([place])   . Depression   . Malignant hypertension   . Transaminitis   . Pneumonitis  08/04/2019  . Breast cancer ([place]) 08/04/2019  . Tachycardia   . Ocular proptosis 02/14/2019  . PVD (posterior vitreous detachment), left eye 02/14/2019  . Chorioretinal scar of left eye 02/14/2019  . Vitreous floaters of left eye 02/14/2019  . Bilateral leg weakness 01/24/2019  . Insomnia 08/02/2017  . Low back pain radiating to both legs 08/02/2017  . Migraine 08/02/2017  . Neuropathy 08/02/2017  . 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 Left shoulder tendonitis 05/23/2016    Class: Acute  . 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 Anxiety disorder 01/07/2015    Past Surgical History:  Procedure Laterality Date  . ABLATION    . BREAST SURGERY Bilateral 2011   Breast Reduction Surgery  . BUNIONECTOMY    . CARDIAC CATHETERIZATION     10/05/04 [facility]): LM < 25%, otherwise normal coronaries. No pulmonary HTN, Mildly enlarged RV. Secundum ASD s/p closure.  Marland Kitchen CARDIAC SURGERY    . CATARACT EXTRACTION    . CLEFT PALATE REPAIR     s/p cleft lip and palate repair  . LUMBAR LAMINECTOMY/DECOMPRESSION MICRODISCECTOMY Left 05/23/2016   Procedure: LEFT L4-L5 LATERAL RECESS DECOMPRESSION WITH CENTRAL AND RIGHT DECOMPRESSION VIA LEFT SIDE;  Surgeon: [name], MD;  Location: [place];  Service: Orthopedics;  Laterality: Left;  . LUMBAR LAMINECTOMY/DECOMPRESSION MICRODISCECTOMY Left 05/23/2016   Procedure: LUMBAR LAMINECTOMY/DECOMPRESSION MICRODISCECTOMY Lumbar five - Sacral One 1 LEVEL;  Surgeon: [name], MD;  Location: [place];  Service: Orthopedics;  Laterality: Left;  . SHOULDER INJECTION Left 05/23/2016   Procedure: SHOULDER INJECTION;  Surgeon: [name], MD;  Location: [place];  Service: Orthopedics;  Laterality: Left;  band-aid per pa-c  . TRANSTHORACIC ECHOCARDIOGRAM     12/15/05 [facility]): Mild LVH, EF > 17%, grade 1 diastolic dysfunction, Trivial MR/PR/TR.     OB History   No obstetric history on file.      Family History  Problem Relation Age of Onset  . Diabetes Mother   . High blood pressure Mother   . Cancer Father        Lung  . Colon cancer Neg Hx   . Colon polyps Neg Hx   . Esophageal cancer Neg Hx   . Rectal cancer Neg Hx   . Stomach cancer Neg Hx     Social History   Tobacco Use  . Smoking status: Former Smoker    Packs/day: 0.25    Years: 25.00    Pack years: 6.25    Types: Cigarettes    Quit date: 02/05/2019    Years since quitting: 1.2  . Smokeless tobacco: Never Used  Vaping Use  . Vaping Use: Never used  Substance Use Topics  . Alcohol use: No  . Drug use: No    Home Medications Prior to Admission medications   Medication Sig Start Date End Date Taking? Authorizing Provider  albuterol (PROVENTIL) (2.5 MG/3ML) 0.083% nebulizer solution Take 2.5 mg by nebulization every 6 (six) hours as needed for wheezing or shortness of breath.   Yes [provider]  ALPRAZolam (XANAX) 1 MG tablet Take 1 mg by mouth at bedtime.  02/14/19  Yes [provider]  amitriptyline (ELAVIL) 75 MG tablet Take 75 mg by mouth at bedtime.  12/29/19  Yes [provider]  benzonatate (TESSALON) 200 MG capsule Take 200 mg by mouth 3 (three) times daily as needed for cough.   Yes [provider]  clindamycin (CLEOCIN) 300 MG capsule Take 1 capsule (300 mg total) by mouth 3 (three) times daily for 7 days. Plz take probiotics over the counter to reduce risk of antibiotic related diarrhea. 04/15/20 04/22/20 Yes Brunetta Genera, MD  guaiFENesin (MUCINEX) 600 MG 12 hr tablet Take 1 tablet (600 mg total) by mouth 2 (two) times daily. Patient taking differently: Take 600 mg by mouth 2 (two) times daily as needed for cough.  08/05/19 08/04/20 Yes Georgette Shell, MD  HYDROcodone-acetaminophen (NORCO) 5-325 MG tablet Take 1-2 tablets by mouth every 6 (six) hours as needed for moderate pain. 04/09/20  Yes Brunetta Genera, MD  Lidocaine HCl (BURN RELIEF) 1 %  GEL Apply 1 application topically as needed (abcess).   Yes [provider]  loperamide (IMODIUM) 2 MG capsule Take 2 mg by mouth as needed for diarrhea or loose stools.   Yes [provider]  Magnesium 500 MG TABS Take 1 tablet (500 mg total) by mouth in the morning and at bedtime. 08/21/19  Yes Brunetta Genera, MD  mupirocin ointment (BACTROBAN) 2 % Apply 1 application topically 2 (two) times daily. To area over rt gluteal region 04/15/20  Yes Brunetta Genera, MD  omeprazole (PRILOSEC) 10 MG capsule Take 10 mg by mouth daily.    Yes [provider]  ondansetron (ZOFRAN) 8 MG tablet Take 1 tablet (8 mg total) by mouth every 8 (eight) hours as needed for nausea or vomiting. 03/26/19  Yes Kale, Cloria Spring, MD  VERZENIO 100 MG tablet TAKE 1 TABLET (100 MG TOTAL) BY MOUTH 2 (TWO) TIMES DAILY. Patient taking differently: Take 100 mg by mouth 2 (two) times daily.  03/25/20  Yes Brunetta Genera, MD  Vitamin D, Ergocalciferol, (DRISDOL) 1.25 MG (50000 UNIT) CAPS capsule TAKE 1 CAPSULE BY MOUTH 1 TIME A WEEK Patient taking differently: Take 50,000 Units by mouth every 7 (seven) days.  01/02/20  Yes Brunetta Genera, MD  doxycycline (VIBRAMYCIN) 100 MG capsule Take 1 capsule (100 mg total) by mouth 2 (two) times daily for 7 days. 04/21/20 04/28/20  Eustaquio Maize, PA-C    Allergies    Zithromax [azithromycin] and Psyllium  Review of Systems   Review of Systems  Constitutional: Positive for chills and fever.  Skin:       + abscess to right buttock    Physical Exam Updated Vital Signs BP 132/69 (BP Location: Right Arm)   Pulse (!) 120   Temp 98.3 F (36.8 C) (Oral)   Resp 20   Ht 5\' 7"  (1.702 m)   Wt 102.2 kg   SpO2 98%   BMI 35.27 kg/m   Physical Exam Vitals and nursing note reviewed.  Constitutional:      Appearance: She is not ill-appearing.  HENT:     Head: Normocephalic and atraumatic.  Eyes:     Conjunctiva/sclera: Conjunctivae  normal.  Cardiovascular:     Rate and Rhythm: Regular rhythm. Tachycardia present.  Pulmonary:     Effort: Pulmonary effort is normal.     Breath sounds: Normal breath sounds. No wheezing, rhonchi or rales.  Abdominal:     Palpations: Abdomen is soft.     Tenderness: There is no abdominal tenderness.  Genitourinary:    Comments: Chaperone present. 4 x 3 cm area of induration, erythema, and increased warmth to right buttock; does not extend into gluteal cleft/rectal area Musculoskeletal:     Cervical back: Neck supple.  Skin:    General: Skin is warm and dry.  Neurological:     Mental Status: She is alert.     ED Results / Procedures / Treatments   Labs (all labs ordered are listed, but only abnormal results are displayed) Labs Reviewed  CBC WITH DIFFERENTIAL/PLATELET - Abnormal; Notable for the following components:      Result Value   RBC 3.14 (*)    Hemoglobin 10.4 (*)    HCT 31.4 (*)    RDW 16.3 (*)    All other components within normal limits  BASIC METABOLIC PANEL - Abnormal; Notable for the following components:   Glucose, Bld 132 (*)    Creatinine, Ser 1.35 (*)    Calcium 8.6 (*)    GFR, Estimated 46 (*)    All other components within normal limits  LACTIC ACID, PLASMA - Abnormal; Notable for the following components:   Lactic Acid, Venous 2.8 (*)    All other components within normal limits  LACTIC ACID, PLASMA - Abnormal; Notable for the following components:   Lactic Acid, Venous 2.2 (*)    All other components within normal limits    EKG None  Radiology No results found.  Procedures Ultrasound ED Soft Tissue  Date/Time: 04/21/2020 11:52 AM Performed by: Eustaquio Maize, PA-C Authorized by: Eustaquio Maize, PA-C   Procedure details:    Indications: localization of abscess     Transverse view:  Visualized   Longitudinal view:  Visualized   Images: archived   Location:    Location: buttocks     Side:  Right Findings:     abscess  present  .Marland KitchenIncision and Drainage  Date/Time: 04/21/2020 1:39 PM Performed by: Eustaquio Maize, PA-C Authorized by: Eustaquio Maize, PA-C   Consent:    Consent obtained:  Verbal   Consent given by:  Patient   Risks discussed:  Bleeding, incomplete drainage, pain and damage to other organs   Alternatives discussed:  No treatment Universal protocol:    Procedure explained and questions answered to patient or proxy's satisfaction: yes     Relevant documents present and verified: yes     Test results available and properly labeled: yes     Imaging studies available: yes     Required blood products, implants, devices, and special equipment available: yes     Site/side marked: yes     Immediately prior to procedure a time out was called: yes     Patient identity confirmed:  Verbally with patient Location:    Type:  Abscess Pre-procedure details:    Skin preparation:  Betadine Anesthesia (see MAR for exact dosages):    Anesthesia method:  Local infiltration   Local anesthetic:  Lidocaine 2% WITH epi Procedure type:    Complexity:  Complex Procedure details:    Incision types:  Single straight   Incision depth:  Subcutaneous   Scalpel blade:  11   Wound management:  Probed and deloculated and irrigated with saline   Drainage:  Purulent and serosanguinous   Drainage amount:  Moderate   Packing materials:  1/2 in gauze Post-procedure details:    Patient tolerance of procedure:  Tolerated well,  no immediate complications   (including critical care time)  Medications Ordered in ED Medications  sodium chloride 0.9 % bolus 1,000 mL (0 mLs Intravenous Stopped 04/21/20 1342)  doxycycline (VIBRAMYCIN) 100 mg in sodium chloride 0.9 % 250 mL IVPB (0 mg Intravenous Stopped 04/21/20 1439)  lidocaine-EPINEPHrine (XYLOCAINE W/EPI) 2 %-1:200000 (PF) injection 10 mL (10 mLs Infiltration Given 04/21/20 1214)  oxyCODONE-acetaminophen (PERCOCET/ROXICET) 5-325 MG per tablet 1 tablet (1 tablet Oral  Given 04/21/20 1213)    ED Course  I have reviewed the triage vital signs and the nursing notes.  Pertinent labs & imaging results that were available during my care of the patient were reviewed by me and considered in my medical decision making (see chart for details).  Clinical Course as of Apr 22 1447  Tue Apr 21, 2020  1124 WBC: 4.5 [MV]  1124 Neutrophils: 55 [MV]  1124 Immature Granulocytes: 0 [MV]    Clinical Course User Index [MV] Eustaquio Maize, PA-C   MDM Rules/Calculators/A&P                          54 year old female with a history of metastatic breast cancer, currently receiving fulvestrant injections IM who presents to the ED today with right-sided buttock abscess that has been present since receiving her IM injection 2 weeks ago.  Was seen by Dr. Velvet Bathe oncologist on 11/10 and started on clindamycin, mupirocin, pain medicine.  Reports that it has not been helping, abscess has been draining, called back today and was advised to come to the ED for further evaluation.  Please see telephone encounter above, appears it was recommended the patient have IV antibiotics, labs, surgical consult.  On arrival to the ED patient is afebrile, nontachypneic.  She is noted to be tachycardic at 120.  She does report fevers over the past few days, T-max 100.3 several days ago.  He appears to be uncomfortable due to buttock pain, chaperone was present during exam which does show a 4 x 3 cm area of induration, erythema, tenderness palpation along the right buttock, does not extend into the gluteal cleft and does not appear to extend into the rectum.   CBC without leukocytosis. 55% neutrophils with 0% bands; No neutropenia today BMP with creatinine 1.35; baseline  Lab Results  Component Value Date   CREATININE 1.35 (H) 04/21/2020   CREATININE 1.34 (H) 03/23/2020   CREATININE 1.16 (H) 02/24/2020   Lactic acid has returned elevated at 2.8; will provide fluids  Have discussed case with pt's  oncologist Dr. Irene Limbo; it appears he was mostly concerned that pt could be neutropenic today and wanted her to be evaluated in the ED. He recommends either I&D in the ED or surgical consultation but will leave it up to our clinical judgement. He would recommend holding pt's verzenio until the abscess improves with close outpatient follow up with surgery and wound care if I&D is to be performed in the ED. He reports if abscess is mostly indurated than he would recommend admission with IV abx for a few days. Will provide IV abx in the ED at a one time dose today and plan for ultrasound at bedside to assess for fluctuance  Ultrasound with small amount of fluid appreciated; will attempt I&D in the ED today.    Repeat lactic 2.2. Heart rate has decreased to the 90s. Will have patient increase her oral intake at home. In the setting of no WBC count and vitals reassuring I do  not feel pt needs additional fluids as she does not meet sepsis criteria.   I&D performed with moderate amount of purulent and serosangiunous fluid drained. 1/2 in iodoform gauze packed. Will discharge patient home at this time with close outpatient follow up with general surgery. Wound care referral placed as recommended by Dr. Irene Limbo. Pt instructed to return to the ED in 48 hours if unable to see surgery for packing removal. Stable for discharge home.   This note was prepared using Dragon voice recognition software and may include unintentional dictation errors due to the inherent limitations of voice recognition software.  Final Clinical Impression(s) / ED Diagnoses Final diagnoses:  Abscess of buttock, right    Rx / DC Orders ED Discharge Orders         Ordered    AMB referral to wound care center        04/21/20 1342    doxycycline (VIBRAMYCIN) 100 MG capsule  2 times daily        04/21/20 1342           Discharge Instructions     Please pick up antibiotics and take as prescribed  It is recommended by Dr. Irene Limbo that you  hold off on your verzenio until this abscess clears   Apply warm compresses to the area. Take pain medication as needed. Please follow up with [facility] for further evaluation as recommended by Dr. Irene Limbo. Call to schedule an appointment. I have also placed an ambulatory referral to wound care.   If you are unable to see surgery in the next 2-3 days please return to the ED for wound check and for packing removal        Eustaquio Maize, PA-C 04/21/20 1449    Blanchie Dessert, MD 04/23/20 1021

## 2020-04-21 NOTE — Telephone Encounter (Signed)
Patient called 11/15 @ 1630 to report: Right buttock area very hard on sides. Abcess is draining a lot - brown/serosanguinous. Still taking antibiotic and using creme. She thinks it is tunneling. Low grade temp 99 at times. Pain medicine only helping some. She wanted to know if Dr. Irene Limbo thought it was time to refer to surgeon as discussed in appointment on 11/10. Dr. Irene Limbo informed.  Contacted patient with MD response: Would recommend going to ED for IV antibiotics and urgent surgical consideration today. Will need labs to see if she is neutropenic and possibly hold verzenio if ANC low and might need consideration of granix if neutropenic. Patient verbalized understanding and states she will go this morning. States area on buttocks getting harder and drainage has slowed - she thinks it may be pooling inside the wound.

## 2020-04-22 ENCOUNTER — Ambulatory Visit
Payer: 59 | Attending: Student in an Organized Health Care Education/Training Program | Admitting: Student in an Organized Health Care Education/Training Program

## 2020-04-22 ENCOUNTER — Encounter: Payer: Self-pay | Admitting: Student in an Organized Health Care Education/Training Program

## 2020-04-22 ENCOUNTER — Other Ambulatory Visit: Payer: Self-pay | Admitting: Hematology

## 2020-04-22 DIAGNOSIS — G894 Chronic pain syndrome: Secondary | ICD-10-CM

## 2020-04-22 DIAGNOSIS — M47816 Spondylosis without myelopathy or radiculopathy, lumbar region: Secondary | ICD-10-CM

## 2020-04-22 DIAGNOSIS — M545 Low back pain, unspecified: Secondary | ICD-10-CM

## 2020-04-22 DIAGNOSIS — M47817 Spondylosis without myelopathy or radiculopathy, lumbosacral region: Secondary | ICD-10-CM

## 2020-04-22 DIAGNOSIS — C50919 Malignant neoplasm of unspecified site of unspecified female breast: Secondary | ICD-10-CM

## 2020-04-22 DIAGNOSIS — M4310 Spondylolisthesis, site unspecified: Secondary | ICD-10-CM

## 2020-04-22 DIAGNOSIS — G8929 Other chronic pain: Secondary | ICD-10-CM

## 2020-04-22 DIAGNOSIS — M5137 Other intervertebral disc degeneration, lumbosacral region: Secondary | ICD-10-CM

## 2020-04-22 NOTE — Progress Notes (Signed)
I attempted to call the patient however no response. Voicemail left instructing patient to call front desk office at [phone] to reschedule appointment. -Dr Holley Raring  Post-Procedure Evaluation  Procedure (03/30/2020):   Type: Lumbar Facet, Medial Branch Block(s) #2  Primary Purpose: Therapeutic Region: Posterolateral Lumbosacral Spine Level: L2, L3, L4, L5, Medial Branch Level(s). Injecting these levels blocks the L2-3, L3-4, L4-5, lumbar facet joints. Laterality: Bilateral  Sedation: Please see nurses note.  Effectiveness during initial hour after procedure(Ultra-Short Term Relief): 100 %   Local anesthetic used: Long-acting (4-6 hours) Effectiveness: Defined as any analgesic benefit obtained secondary to the administration of local anesthetics. This carries significant diagnostic value as to the etiological location, or anatomical origin, of the pain. Duration of benefit is expected to coincide with the duration of the local anesthetic used.  Effectiveness during initial 4-6 hours after procedure(Short-Term Relief): 100 % .  Long-term benefit: Defined as any relief past the pharmacologic duration of the local anesthetics.  Effectiveness past the initial 6 hours after procedure(Long-Term Relief): 100 %   Current benefits: Defined as benefit that persist at this time.   Analgesia:  90-100% better Function: Brandy [name] reports improvement in function ROM: Brandy [name] reports improvement in ROM

## 2020-04-28 ENCOUNTER — Ambulatory Visit: Payer: 59

## 2020-05-06 ENCOUNTER — Other Ambulatory Visit: Payer: Self-pay | Admitting: Medical Oncology

## 2020-05-06 ENCOUNTER — Other Ambulatory Visit: Payer: Self-pay | Admitting: *Deleted

## 2020-05-06 DIAGNOSIS — R945 Abnormal results of liver function studies: Secondary | ICD-10-CM

## 2020-05-06 DIAGNOSIS — C50919 Malignant neoplasm of unspecified site of unspecified female breast: Secondary | ICD-10-CM

## 2020-05-06 DIAGNOSIS — R7989 Other specified abnormal findings of blood chemistry: Secondary | ICD-10-CM

## 2020-05-07 ENCOUNTER — Inpatient Hospital Stay: Payer: 59 | Attending: Hematology

## 2020-05-07 ENCOUNTER — Inpatient Hospital Stay: Payer: 59

## 2020-05-07 ENCOUNTER — Other Ambulatory Visit: Payer: Self-pay

## 2020-05-07 ENCOUNTER — Inpatient Hospital Stay (HOSPITAL_BASED_OUTPATIENT_CLINIC_OR_DEPARTMENT_OTHER): Payer: 59 | Admitting: Hematology

## 2020-05-07 VITALS — BP 168/86 | HR 110 | Temp 96.0°F | Resp 18 | Ht 67.0 in | Wt 226.6 lb

## 2020-05-07 DIAGNOSIS — I700 Atherosclerosis of aorta: Secondary | ICD-10-CM | POA: Insufficient documentation

## 2020-05-07 DIAGNOSIS — Z801 Family history of malignant neoplasm of trachea, bronchus and lung: Secondary | ICD-10-CM | POA: Diagnosis not present

## 2020-05-07 DIAGNOSIS — C50919 Malignant neoplasm of unspecified site of unspecified female breast: Secondary | ICD-10-CM

## 2020-05-07 DIAGNOSIS — Z5111 Encounter for antineoplastic chemotherapy: Secondary | ICD-10-CM | POA: Diagnosis not present

## 2020-05-07 DIAGNOSIS — F17210 Nicotine dependence, cigarettes, uncomplicated: Secondary | ICD-10-CM | POA: Insufficient documentation

## 2020-05-07 DIAGNOSIS — Z79899 Other long term (current) drug therapy: Secondary | ICD-10-CM | POA: Insufficient documentation

## 2020-05-07 DIAGNOSIS — R7989 Other specified abnormal findings of blood chemistry: Secondary | ICD-10-CM | POA: Insufficient documentation

## 2020-05-07 DIAGNOSIS — C50811 Malignant neoplasm of overlapping sites of right female breast: Secondary | ICD-10-CM | POA: Diagnosis present

## 2020-05-07 DIAGNOSIS — R911 Solitary pulmonary nodule: Secondary | ICD-10-CM | POA: Diagnosis not present

## 2020-05-07 DIAGNOSIS — L0231 Cutaneous abscess of buttock: Secondary | ICD-10-CM

## 2020-05-07 DIAGNOSIS — Z800 Family history of malignant neoplasm of digestive organs: Secondary | ICD-10-CM | POA: Insufficient documentation

## 2020-05-07 LAB — CBC WITH DIFFERENTIAL (CANCER CENTER ONLY)
Abs Immature Granulocytes: 0 10*3/uL (ref 0.00–0.07)
Basophils Absolute: 0 10*3/uL (ref 0.0–0.1)
Basophils Relative: 1 %
Eosinophils Absolute: 0.1 10*3/uL (ref 0.0–0.5)
Eosinophils Relative: 3 %
HCT: 34.8 % — ABNORMAL LOW (ref 36.0–46.0)
Hemoglobin: 11.3 g/dL — ABNORMAL LOW (ref 12.0–15.0)
Immature Granulocytes: 0 %
Lymphocytes Relative: 63 %
Lymphs Abs: 2.8 10*3/uL (ref 0.7–4.0)
MCH: 32.5 pg (ref 26.0–34.0)
MCHC: 32.5 g/dL (ref 30.0–36.0)
MCV: 100 fL (ref 80.0–100.0)
Monocytes Absolute: 0.3 10*3/uL (ref 0.1–1.0)
Monocytes Relative: 7 %
Neutro Abs: 1.1 10*3/uL — ABNORMAL LOW (ref 1.7–7.7)
Neutrophils Relative %: 26 %
Platelet Count: 286 10*3/uL (ref 150–400)
RBC: 3.48 MIL/uL — ABNORMAL LOW (ref 3.87–5.11)
RDW: 16.4 % — ABNORMAL HIGH (ref 11.5–15.5)
WBC Count: 4.4 10*3/uL (ref 4.0–10.5)
nRBC: 0 % (ref 0.0–0.2)

## 2020-05-07 LAB — CMP (CANCER CENTER ONLY)
ALT: 56 U/L — ABNORMAL HIGH (ref 0–44)
AST: 61 U/L — ABNORMAL HIGH (ref 15–41)
Albumin: 3.3 g/dL — ABNORMAL LOW (ref 3.5–5.0)
Alkaline Phosphatase: 104 U/L (ref 38–126)
Anion gap: 9 (ref 5–15)
BUN: 11 mg/dL (ref 6–20)
CO2: 27 mmol/L (ref 22–32)
Calcium: 9.2 mg/dL (ref 8.9–10.3)
Chloride: 106 mmol/L (ref 98–111)
Creatinine: 1.27 mg/dL — ABNORMAL HIGH (ref 0.44–1.00)
GFR, Estimated: 50 mL/min — ABNORMAL LOW (ref >60)
Glucose, Bld: 116 mg/dL — ABNORMAL HIGH (ref 70–99)
Potassium: 3.9 mmol/L (ref 3.5–5.1)
Sodium: 142 mmol/L (ref 135–145)
Total Bilirubin: 0.6 mg/dL (ref 0.3–1.2)
Total Protein: 7.1 g/dL (ref 6.5–8.1)

## 2020-05-07 MED ORDER — IBUPROFEN 200 MG PO TABS
ORAL_TABLET | ORAL | Status: AC
  Filled 2020-05-07: qty 1

## 2020-05-07 MED ORDER — IBUPROFEN 200 MG PO TABS
400.0000 mg | ORAL_TABLET | Freq: Once | ORAL | Status: AC
  Administered 2020-05-07: 400 mg via ORAL

## 2020-05-07 MED ORDER — IBUPROFEN 200 MG PO TABS
ORAL_TABLET | ORAL | Status: AC
  Filled 2020-05-07: qty 2

## 2020-05-07 MED ORDER — DIPHENHYDRAMINE HCL 25 MG PO CAPS
ORAL_CAPSULE | ORAL | Status: AC
  Filled 2020-05-07: qty 1

## 2020-05-07 MED ORDER — FAMOTIDINE 20 MG PO TABS
ORAL_TABLET | ORAL | Status: AC
  Filled 2020-05-07: qty 1

## 2020-05-07 MED ORDER — DIPHENHYDRAMINE HCL 25 MG PO CAPS
25.0000 mg | ORAL_CAPSULE | Freq: Once | ORAL | Status: AC
  Administered 2020-05-07: 25 mg via ORAL

## 2020-05-07 MED ORDER — FAMOTIDINE 20 MG PO TABS
20.0000 mg | ORAL_TABLET | Freq: Once | ORAL | Status: AC
  Administered 2020-05-07: 20 mg via ORAL

## 2020-05-07 MED ORDER — FULVESTRANT 250 MG/5ML IM SOLN
250.0000 mg | Freq: Once | INTRAMUSCULAR | Status: AC
  Administered 2020-05-07: 250 mg via INTRAMUSCULAR

## 2020-05-07 NOTE — Patient Instructions (Addendum)
Fulvestrant injection What is this medicine? FULVESTRANT (ful VES trant) blocks the effects of estrogen. It is used to treat breast cancer. This medicine may be used for other purposes; ask your health care provider or pharmacist if you have questions. COMMON BRAND NAME(S): FASLODEX What should I tell my health care provider before I take this medicine? They need to know if you have any of these conditions:  bleeding disorders  liver disease  low blood counts, like low white cell, platelet, or red cell counts  an unusual or allergic reaction to fulvestrant, other medicines, foods, dyes, or preservatives  pregnant or trying to get pregnant  breast-feeding How should I use this medicine? This medicine is for injection into a muscle. It is usually given by a health care professional in a hospital or clinic setting. Talk to your pediatrician regarding the use of this medicine in children. Special care may be needed. Overdosage: If you think you have taken too much of this medicine contact a poison control center or emergency room at once. NOTE: This medicine is only for you. Do not share this medicine with others. What if I miss a dose? It is important not to miss your dose. Call your doctor or health care professional if you are unable to keep an appointment. What may interact with this medicine?  medicines that treat or prevent blood clots like warfarin, enoxaparin, dalteparin, apixaban, dabigatran, and rivaroxaban This list may not describe all possible interactions. Give your health care provider a list of all the medicines, herbs, non-prescription drugs, or dietary supplements you use. Also tell them if you smoke, drink alcohol, or use illegal drugs. Some items may interact with your medicine. What should I watch for while using this medicine? Your condition will be monitored carefully while you are receiving this medicine. You will need important blood work done while you are taking  this medicine. Do not become pregnant while taking this medicine or for at least 1 year after stopping it. Women of child-bearing potential will need to have a negative pregnancy test before starting this medicine. Women should inform their doctor if they wish to become pregnant or think they might be pregnant. There is a potential for serious side effects to an unborn child. Men should inform their doctors if they wish to father a child. This medicine may lower sperm counts. Talk to your health care professional or pharmacist for more information. Do not breast-feed an infant while taking this medicine or for 1 year after the last dose. What side effects may I notice from receiving this medicine? Side effects that you should report to your doctor or health care professional as soon as possible:  allergic reactions like skin rash, itching or hives, swelling of the face, lips, or tongue  feeling faint or lightheaded, falls  pain, tingling, numbness, or weakness in the legs  signs and symptoms of infection like fever or chills; cough; flu-like symptoms; sore throat  vaginal bleeding Side effects that usually do not require medical attention (report to your doctor or health care professional if they continue or are bothersome):  aches, pains  constipation  diarrhea  headache  hot flashes  nausea, vomiting  pain at site where injected  stomach pain This list may not describe all possible side effects. Call your doctor for medical advice about side effects. You may report side effects to FDA at 1-800-FDA-1088. Where should I keep my medicine? This drug is given in a hospital or clinic and will   not be stored at home. NOTE: This sheet is a summary. It may not cover all possible information. If you have questions about this medicine, talk to your doctor, pharmacist, or health care provider.  2020 Elsevier/Gold Standard (2017-08-31 11:34:41)  PLEASE TAKE 20 MG PEPCID (FAMOTIDINE), 400 MG  ADVIL (IBUPROFEN), AND 25 MG OF BENADRYL TOMORROW.

## 2020-05-07 NOTE — Addendum Note (Signed)
Addended by: Tora Kindred on: 05/07/2020 10:33 AM   Modules accepted: Orders

## 2020-05-07 NOTE — Progress Notes (Signed)
HEMATOLOGY/ONCOLOGY CLINIC NOTE  Date of Service: 05/07/2020  Patient Care Team: [facility] as PCP - General Everardo Beals, NP as Nurse Practitioner [facility]  CHIEF COMPLAINTS/PURPOSE OF CONSULTATION:  -f/u for metastatic breast cancer   HISTORY OF PRESENTING ILLNESS:  Brandy [name] is a wonderful 54 y.o. female who has been referred to [place] by [facility]* for evaluation and management of her suspected breast cancer.   The pt reports that she was having increasing low back pain on her right side that would not resolve, so she went to the ED. This pain has not resolved. She notes pain in her hips and knees.   Of note prior to the patient's visit today, pt has had an abdominal limited right upper ultrasound completed on 02/18/2019 with results revealing "Probable hepatic steatosis. No other abnormality seen in the right upper quadrant of the abdomen."  Pt had abdomen and pelvis CT completed on 02/18/2019 with results revealing "5cm spiculated soft tissue mass in inferior right breast, highly suspicious for primary breast carcinoma. No acute findings or metastatic disease within the abdomen or pelvis. Multiple small pulmonary nodules in both lung bases, consistent with pulmonary metastases. 4.5 cm uterine fibroid."  Most recent lab results (02/18/2019) of CBC is as follows: all values are WNL except for glucose at 121, calcium at 8.7, AST at 49, and ALT at 53.  On review of systems, pt reports pedal edema and denies chest pain, shortness of breath, weight loss, and any other symptoms.  Her last known mammography was on 06/18/2012.   On Social Hx the pt reports that she is a smoker and she smokes about half a pack per day. She does not drink alcohol. She has three children: age 77, 22, and 50.  On Family Hx the pt reports lung cancer. Maternal Cousin passed from stomach cancer at the age of 63.    INTERVAL HISTORY:  Brandy [name] is a 54 y.o. female here for evaluation and management of metastatic breast cancer. The patient's last visit with [place] was on 04/15/2020. The pt reports that she is doing well overall.  The pt reports that her right buttock abscess continues to hurt and burn. Pt had her abscess deloculated, probed, and irrigated while in the hospital. She saw Surgery three days after her hospital visit. Pt has her [facility] with [facility] tomorrow. Pt was switched from Clindamycin to Doxycycline after experiencing a yeast infection and nausea. She has completed her course of Doxycycline. Pt was given Diflucan to treat her yeast infection which has now resolved. Her wound is not draining much at this time. She has not packed her wound in nearly a week. Pt restarted Verzenio just under a week ago.   Lab results today (05/07/20) of CBC w/diff and CMP is as follows: all values are WNL except for RBC at 3.48, Hgb at 11.3, HCT at 34.8, RDW at 16.4, Neutro Abs at 1.1K, Glucose at 116, Creatinine at 1.27, Albumin at 3.3, AST at 61, ALT at 56, GFR Est at 50.  On review of systems, pt reports right buttock pain, leg swelling and denies fevers, chills, breast pain, diarrhea, purulent discharge and any other symptoms.   MEDICAL HISTORY:  Past Medical History:  Diagnosis Date  . Acute pansinusitis 08/02/2017  . Arthritis   . ASD (atrial septal defect)    s/p closure with Amplatzer device 10/05/04 (Dr. Myriam Jacobson, [facility]) 10/05/04  .  Cancer ([place])   . Cataract   . GERD (gastroesophageal reflux disease)   . Headache   . Heart murmur    no longer heard  . Legally blind in right eye, as defined in [place]   . Lumbar herniated disc   . Sciatica   Uterine Fibroids  SURGICAL HISTORY: Past Surgical History:  Procedure Laterality Date  . ABLATION    . BREAST SURGERY Bilateral 2011   Breast Reduction Surgery  . BUNIONECTOMY    . CARDIAC CATHETERIZATION     10/05/04 [facility]): LM < 25%, otherwise normal  coronaries. No pulmonary HTN, Mildly enlarged RV. Secundum ASD s/p closure.  Marland Kitchen CARDIAC SURGERY    . CATARACT EXTRACTION    . CLEFT PALATE REPAIR     s/p cleft lip and palate repair  . LUMBAR LAMINECTOMY/DECOMPRESSION MICRODISCECTOMY Left 05/23/2016   Procedure: LEFT L4-L5 LATERAL RECESS DECOMPRESSION WITH CENTRAL AND RIGHT DECOMPRESSION VIA LEFT SIDE;  Surgeon: [name], MD;  Location: [place];  Service: Orthopedics;  Laterality: Left;  . LUMBAR LAMINECTOMY/DECOMPRESSION MICRODISCECTOMY Left 05/23/2016   Procedure: LUMBAR LAMINECTOMY/DECOMPRESSION MICRODISCECTOMY Lumbar five - Sacral One 1 LEVEL;  Surgeon: [name], MD;  Location: [place];  Service: Orthopedics;  Laterality: Left;  . SHOULDER INJECTION Left 05/23/2016   Procedure: SHOULDER INJECTION;  Surgeon: [name], MD;  Location: [place];  Service: Orthopedics;  Laterality: Left;  band-aid per pa-c  . TRANSTHORACIC ECHOCARDIOGRAM     12/15/05 [facility]): Mild LVH, EF > 19%, grade 1 diastolic dysfunction, Trivial MR/PR/TR.  Endometrial ablation 2003 Breast Reduction, bilateral 2011  SOCIAL HISTORY: Social History   Socioeconomic History  . Marital status: Married    Spouse name: Not on file  . Number of children: 3  . Years of education: Not on file  . Highest education level: Not on file  Occupational History  . Not on file  Tobacco Use  . Smoking status: Former Smoker    Packs/day: 0.25    Years: 25.00    Pack years: 6.25    Types: Cigarettes    Quit date: 02/05/2019    Years since quitting: 1.2  . Smokeless tobacco: Never Used  Vaping Use  . Vaping Use: Never used  Substance and Sexual Activity  . Alcohol use: No  . Drug use: No  . Sexual activity: Not on file  Other Topics Concern  . Not on file  Social History Narrative  . Not on file   Social Determinants of Health   Financial Resource Strain:   . Difficulty of Paying Living Expenses: Not on file  Food Insecurity:   . Worried About Charity fundraiser  in the Last Year: Not on file  . Ran Out of Food in the Last Year: Not on file  Transportation Needs:   . Lack of Transportation (Medical): Not on file  . Lack of Transportation (Non-Medical): Not on file  Physical Activity:   . Days of Exercise per Week: Not on file  . Minutes of Exercise per Session: Not on file  Stress:   . Feeling of Stress : Not on file  Social Connections:   . Frequency of Communication with Friends and Family: Not on file  . Frequency of Social Gatherings with Friends and Family: Not on file  . Attends Religious Services: Not on file  . Active Member of Clubs or Organizations: Not on file  . Attends Archivist Meetings: Not on file  . Marital Status: Not on  file  Intimate Partner Violence:   . Fear of Current or Ex-Partner: Not on file  . Emotionally Abused: Not on file  . Physically Abused: Not on file  . Sexually Abused: Not on file    FAMILY HISTORY: Family History  Problem Relation Age of Onset  . Diabetes Mother   . High blood pressure Mother   . Cancer Father        Lung  . Colon cancer Neg Hx   . Colon polyps Neg Hx   . Esophageal cancer Neg Hx   . Rectal cancer Neg Hx   . Stomach cancer Neg Hx     ALLERGIES:  is allergic to zithromax [azithromycin] and psyllium.  MEDICATIONS:  Current Outpatient Medications  Medication Sig Dispense Refill  . albuterol (PROVENTIL) (2.5 MG/3ML) 0.083% nebulizer solution Take 2.5 mg by nebulization every 6 (six) hours as needed for wheezing or shortness of breath.    . ALPRAZolam (XANAX) 1 MG tablet Take 1 mg by mouth at bedtime.     Marland Kitchen amitriptyline (ELAVIL) 75 MG tablet Take 75 mg by mouth at bedtime.     . benzonatate (TESSALON) 200 MG capsule Take 200 mg by mouth 3 (three) times daily as needed for cough.    Marland Kitchen guaiFENesin (MUCINEX) 600 MG 12 hr tablet Take 1 tablet (600 mg total) by mouth 2 (two) times daily. (Patient taking differently: Take 600 mg by mouth 2 (two) times daily as needed for  cough. ) 60 tablet 2  . HYDROcodone-acetaminophen (NORCO) 5-325 MG tablet Take 1-2 tablets by mouth every 6 (six) hours as needed for moderate pain. 30 tablet 0  . Lidocaine HCl (BURN RELIEF) 1 % GEL Apply 1 application topically as needed (abcess).    Marland Kitchen loperamide (IMODIUM) 2 MG capsule Take 2 mg by mouth as needed for diarrhea or loose stools.    . Magnesium 500 MG TABS Take 1 tablet (500 mg total) by mouth in the morning and at bedtime. 60 tablet 1  . mupirocin ointment (BACTROBAN) 2 % Apply 1 application topically 2 (two) times daily. To area over rt gluteal region 22 g 1  . omeprazole (PRILOSEC) 10 MG capsule Take 10 mg by mouth daily.     . ondansetron (ZOFRAN) 8 MG tablet Take 1 tablet (8 mg total) by mouth every 8 (eight) hours as needed for nausea or vomiting. 30 tablet 3  . VERZENIO 100 MG tablet TAKE 1 TABLET (100 MG TOTAL) BY MOUTH 2 (TWO) TIMES DAILY. 56 tablet 0  . Vitamin D, Ergocalciferol, (DRISDOL) 1.25 MG (50000 UNIT) CAPS capsule TAKE 1 CAPSULE BY MOUTH 1 TIME A WEEK (Patient taking differently: Take 50,000 Units by mouth every 7 (seven) days. ) 12 capsule 3   Current Facility-Administered Medications  Medication Dose Route Frequency Provider Last Rate Last Admin  . 0.9 %  sodium chloride infusion  500 mL Intravenous Continuous Nandigam, Venia Minks, MD        REVIEW OF SYSTEMS:   A 10+ POINT REVIEW OF SYSTEMS WAS OBTAINED including neurology, dermatology, psychiatry, cardiac, respiratory, lymph, extremities, GI, GU, Musculoskeletal, constitutional, breasts, reproductive, HEENT.  All pertinent positives are noted in the HPI.  All others are negative.   PHYSICAL EXAMINATION: ECOG FS:1 - Symptomatic but completely ambulatory  Vitals:   05/07/20 0943  BP: (!) 168/86  Pulse: (!) 110  Resp: 18  Temp: (!) 96 F (35.6 C)  SpO2: 99%   Wt Readings from Last 3 Encounters:  05/07/20 226  lb 9.6 oz (102.8 kg)  04/21/20 225 lb 3.2 oz (102.2 kg)  04/15/20 228 lb 9.6 oz (103.7 kg)    Body mass index is 35.49 kg/m.    GENERAL:alert, in no acute distress and comfortable SKIN: no acute rashes, less induration & epithelization of right buttock wound EYES: conjunctiva are pink and non-injected, sclera anicteric OROPHARYNX: MMM, no exudates, no oropharyngeal erythema or ulceration NECK: supple, no JVD LYMPH:  no palpable lymphadenopathy in the cervical, axillary or inguinal regions LUNGS: clear to auscultation b/l with normal respiratory effort HEART: regular rate & rhythm ABDOMEN:  normoactive bowel sounds , non tender, not distended. No palpable hepatosplenomegaly.  Extremity: no pedal edema PSYCH: alert & oriented x 3 with fluent speech NEURO: no focal motor/sensory deficits  LABORATORY DATA:  I have reviewed the data as listed  . CBC Latest Ref Rng & Units 05/07/2020 04/21/2020 03/23/2020  WBC 4.0 - 10.5 K/uL 4.4 4.5 3.2(L)  Hemoglobin 12.0 - 15.0 g/dL 11.3(L) 10.4(L) 11.6(L)  Hematocrit 36 - 46 % 34.8(L) 31.4(L) 34.9(L)  Platelets 150 - 400 K/uL 286 298 284    . CMP Latest Ref Rng & Units 05/07/2020 04/21/2020 03/23/2020  Glucose 70 - 99 mg/dL 116(H) 132(H) 90  BUN 6 - 20 mg/dL _0 Creatinine 0.44 - 1.00 mg/dL 1.27(H) 1.35(H) 1.34(H)  Sodium 135 - 145 mmol/L 142 139 142  Potassium 3.5 - 5.1 mmol/L 3.9 3.5 4.0  Chloride 98 - 111 mmol/L 106 105 106  CO2 22 - 32 mmol/L _1 Calcium 8.9 - 10.3 mg/dL 9.2 8.6(L) 9.5  Total Protein 6.5 - 8.1 g/dL 7.1 - 7.4  Total Bilirubin 0.3 - 1.2 mg/dL 0.6 - 0.2(L)  Alkaline Phos 38 - 126 U/L 104 - 159(H)  AST 15 - 41 U/L 61(H) - 67(H)  ALT 0 - 44 U/L 56(H) - 72(H)   Component     Latest Ref Rng & Units 03/08/2019 03/22/2019  FSH     mIU/mL 65.5   LH     mIU/mL 46.6   Estrogen     pg/mL 82   Progesterone     ng/mL <0.1   Estradiol     pg/mL  <5.0        RADIOGRAPHIC STUDIES: I have personally reviewed the radiological images as listed and agreed with the findings in the report. No results  found.  ASSESSMENT & PLAN:   Brandy [name] is a 54 y.o. female with:  1. Metastatic breast cancer ER+/PRneg/Her2 neg  02/28/2019 neck CT with results revealing "negative for mass or adenopathy in the neck."  03/04/2019 head MRI with results revealing "Negative for metastatic disease.  No acute abnormality in the brain."  2. Likely Pulmonary metastases  02/18/2019 chest and abdomen with results revealing "5cm spiculated soft tissue mass in inferior right breast, highly suspicious for primary breast carcinoma. No acute findings or metastatic disease within the abdomen or pelvis. Multiple small pulmonary nodules in both lung bases, consistent with pulmonary metastases. 4.5 cm uterine fibroid."  02/28/2019 C/A/P CT with results revealing "Irregular solid 5.0 cm right breast mass, suspicious for primary right breast malignancy. Innumerable solid pulmonary nodules scattered throughout both lungs, compatible with pulmonary metastases. No evidence of metastatic disease in the abdomen, pelvis or skeleton. Mildly enlarged and probably myomatous uterus. Simple 1.4 cm left adnexal cyst requires no follow-up. This recommendation follows ACR consensus guidelines: White Paper of the ACR Incidental Findings Committee II on Adnexal Findings. J Am Coll  Radiol 2013:10:675-681. Aortic Atherosclerosis (ICD10-I70.0)."  NUCLEAR MEDICINE WHOLE BODY BONE SCAN completed on 03/19/2019 with results revealing "1. No scintigraphic evidence skeletal metastasis. 2. Degenerative bone disease in the posterior elements of the upper and mid lumbar spine."   04/09/2019 Bone Density ([id number]) which revealed "The BMD measured at Femur Neck Right is 1.153 g/cm2 with a T-score of 0.8. This patient is considered normal according to [facility]) criteria. Lumbar spine was not utilized due to advanced degenerative changes. The scan quality is good. Femur Neck Right 04/09/2019 54.7 Normal 0.8 1.153 g/cm2. Left  Forearm Radius 33% 04/09/2019 54.7 Normal 0.8 0.949 g/cm2."  08/04/2019 CT Angio Chest ([id number]) revealed "1. No lobar or central pulmonary embolus detected. Exam is limited secondary to respiratory motion. 2. Mild ground-glass attenuation may represent mild pneumonitis or areas of air trapping. 3. Signs of atrial septal closure. 4. Decrease in size and number of bilateral pulmonary nodules, marked response noted on today's exam with the only nodule remaining near a cm in the right upper lobe and the smaller nodules that were present on the previous examination throughout the chest no longer measurable though the lower lobes are limited by respiratory motion. 5. Decreased size of right breast mass. 6. Probable hepatic steatosis."  3. Abnormal Liver function test -- previous? Fatty liver 07/03/2019 [place] Abd ([id number]) revealed "1. No acute findings. Normal gallbladder. No bile duct dilation. 2. Significant increased liver parenchymal echogenicity consistent with extensive hepatic steatosis."  PLAN: -Discussed pt labwork today, 05/07/20; mild neutropenia, Hgb & PLT are okay, blood chemistries are stable, if better. -No lab or clinical evidence of Metastatic Breast Cancer progression at this time. -The pt has no prohibitive toxicities from continuing 100 mg Verzenio BID. -Will give half dose of Faslodex today - only in left buttock. The pt has no prohibitive toxicities at this time. -Will repeat labs in 2 weeks  -Will see back in 4 weeks with labs   FOLLOW UP: Labs in 2 weeks RTC with Dr Irene Limbo with labs and next dose of faslodex in 1 month   The total time spent in the appt was 20 minutes and more than 50% was on counseling and direct patient cares.  All of the patient's questions were answered with apparent satisfaction. The patient knows to call the clinic with any problems, questions or concerns.   Sullivan Lone MD [place] AAHIVMS [facility] Hematology/Oncology Physician [facility]  (Office):       [phone] (Work cell):  [phone] (Fax):           [phone]  05/07/2020 10:24 AM  I, Yevette Edwards, am acting as a scribe for Dr. Sullivan Lone.   .I have reviewed the above documentation for accuracy and completeness, and I agree with the above. Brunetta Genera MD

## 2020-05-08 ENCOUNTER — Encounter: Payer: Self-pay | Admitting: Hematology

## 2020-05-11 ENCOUNTER — Encounter: Payer: Self-pay | Admitting: Student in an Organized Health Care Education/Training Program

## 2020-05-19 ENCOUNTER — Telehealth: Payer: Self-pay | Admitting: Hematology

## 2020-05-19 NOTE — Telephone Encounter (Signed)
Scheduled per 12/02 los, patient has been called and notified.

## 2020-05-20 ENCOUNTER — Other Ambulatory Visit: Payer: Self-pay | Admitting: *Deleted

## 2020-05-20 DIAGNOSIS — C50919 Malignant neoplasm of unspecified site of unspecified female breast: Secondary | ICD-10-CM

## 2020-05-21 ENCOUNTER — Inpatient Hospital Stay: Payer: 59

## 2020-05-21 ENCOUNTER — Ambulatory Visit (INDEPENDENT_AMBULATORY_CARE_PROVIDER_SITE_OTHER)
Admission: RE | Admit: 2020-05-21 | Discharge: 2020-05-21 | Disposition: A | Discharge status: Home / Self Care | Payer: 59 | Source: Ambulatory Visit | Attending: Pulmonary Disease | Admitting: Pulmonary Disease

## 2020-05-21 ENCOUNTER — Other Ambulatory Visit: Payer: 59

## 2020-05-21 ENCOUNTER — Other Ambulatory Visit: Payer: Self-pay

## 2020-05-21 DIAGNOSIS — C50919 Malignant neoplasm of unspecified site of unspecified female breast: Secondary | ICD-10-CM

## 2020-05-21 DIAGNOSIS — J984 Other disorders of lung: Secondary | ICD-10-CM

## 2020-05-21 DIAGNOSIS — Z5111 Encounter for antineoplastic chemotherapy: Secondary | ICD-10-CM | POA: Diagnosis not present

## 2020-05-21 LAB — CBC WITH DIFFERENTIAL (CANCER CENTER ONLY)
Abs Immature Granulocytes: 0.02 10*3/uL (ref 0.00–0.07)
Basophils Absolute: 0 10*3/uL (ref 0.0–0.1)
Basophils Relative: 1 %
Eosinophils Absolute: 0.1 10*3/uL (ref 0.0–0.5)
Eosinophils Relative: 2 %
HCT: 33.1 % — ABNORMAL LOW (ref 36.0–46.0)
Hemoglobin: 11.1 g/dL — ABNORMAL LOW (ref 12.0–15.0)
Immature Granulocytes: 0 %
Lymphocytes Relative: 53 %
Lymphs Abs: 2.9 10*3/uL (ref 0.7–4.0)
MCH: 32.8 pg (ref 26.0–34.0)
MCHC: 33.5 g/dL (ref 30.0–36.0)
MCV: 97.9 fL (ref 80.0–100.0)
Monocytes Absolute: 0.4 10*3/uL (ref 0.1–1.0)
Monocytes Relative: 8 %
Neutro Abs: 2 10*3/uL (ref 1.7–7.7)
Neutrophils Relative %: 36 %
Platelet Count: 223 10*3/uL (ref 150–400)
RBC: 3.38 MIL/uL — ABNORMAL LOW (ref 3.87–5.11)
RDW: 16.3 % — ABNORMAL HIGH (ref 11.5–15.5)
WBC Count: 5.5 10*3/uL (ref 4.0–10.5)
nRBC: 0 % (ref 0.0–0.2)

## 2020-05-22 ENCOUNTER — Telehealth: Payer: Self-pay | Admitting: Pulmonary Disease

## 2020-05-22 NOTE — Telephone Encounter (Signed)
Received call report from [facility] with [facility] on patient's CT Chest done on 05/21/20. Tammy, please review the result/impression copied below:  IMPRESSION: 1. No imaging findings of interstitial lung disease. 2. 1.4 x 1.0 x 0.8 cm aggressive appearing nodule near the apex of the right upper lobe. In this location this could simply represent an area of pleuroparenchymal scarring from prior infection, however, close attention on follow-up studies is recommended. Consider one of the following in 3 months for both low-risk and high-risk individuals: (a) repeat chest CT or (b) follow-up PET-CT. This recommendation follows the consensus statement: Guidelines for Management of Incidental Pulmonary Nodules Detected on CT Images: From the Fleischner Society 2017; Radiology 2017; 284:228-243. 3. Mild air trapping indicative of small airways disease. 4. Mild dilatation of the pulmonic trunk (3.4 cm in diameter), concerning for pulmonary arterial hypertension. 5. Aortic atherosclerosis.  Please advise, thank you.

## 2020-05-22 NOTE — Telephone Encounter (Signed)
Tried to call patient to discuss results.  No answer. We will discuss this with Dr. [name] as patient will need a follow-up scan at least CT chest in 3 months.  She does have a history of breast cancer.  Will discuss if PET scan is preferred

## 2020-05-26 ENCOUNTER — Ambulatory Visit: Payer: 59

## 2020-05-26 NOTE — Telephone Encounter (Signed)
Reviewed imaging. Patient had similar lung nodule a year ago that was assumed as pulmonary mets from her breast cancer. Compared to prior imaging as far back as 02/2019, this lesion has decreased in size.  I have attempted to call patient for results. No answer. Left callback number on voicemail.  I have also reached out to her oncologist for preferred follow-up imaging.  Rodman Pickle, M.D. [facility] Pulmonary/Critical Care Medicine 05/26/2020 10:57 AM

## 2020-05-26 NOTE — Telephone Encounter (Signed)
Called and spoke with pt letting her know the info stated by Dr. Ellison and she verbalized understanding. Nothing further needed. 

## 2020-05-28 NOTE — Telephone Encounter (Signed)
Discussed patient's imaging with oncologist, Dr. Irene Limbo. Oncology will determine future imaging between treatments. No further action needed.  Rodman Pickle, M.D. [facility] Pulmonary/Critical Care Medicine 05/28/2020 9:23 AM

## 2020-06-04 ENCOUNTER — Telehealth: Payer: Self-pay | Admitting: Hematology

## 2020-06-04 NOTE — Telephone Encounter (Signed)
Called patient regarding upcoming appointments, left a voicemail. 

## 2020-06-08 ENCOUNTER — Encounter: Payer: Self-pay | Admitting: Hematology

## 2020-06-08 ENCOUNTER — Other Ambulatory Visit: Payer: Self-pay | Admitting: Hematology

## 2020-06-08 DIAGNOSIS — C50919 Malignant neoplasm of unspecified site of unspecified female breast: Secondary | ICD-10-CM

## 2020-06-08 NOTE — Progress Notes (Incomplete)
HEMATOLOGY/ONCOLOGY CLINIC NOTE  Date of Service: 06/08/2020  Patient Care Team: [facility] as PCP - General Everardo Beals, NP as Nurse Practitioner [facility]  CHIEF COMPLAINTS/PURPOSE OF CONSULTATION:  -f/u for metastatic breast cancer   HISTORY OF PRESENTING ILLNESS:  Brandy [name] is a wonderful 55 y.o. female who has been referred to [place] by [facility] for evaluation and management of her suspected breast cancer.   The pt reports that she was having increasing low back pain on her right side that would not resolve, so she went to the ED. This pain has not resolved. She notes pain in her hips and knees.   Of note prior to the patient's visit today, pt has had an abdominal limited right upper ultrasound completed on 02/18/2019 with results revealing "Probable hepatic steatosis. No other abnormality seen in the right upper quadrant of the abdomen."  Pt had abdomen and pelvis CT completed on 02/18/2019 with results revealing "5cm spiculated soft tissue mass in inferior right breast, highly suspicious for primary breast carcinoma. No acute findings or metastatic disease within the abdomen or pelvis. Multiple small pulmonary nodules in both lung bases, consistent with pulmonary metastases. 4.5 cm uterine fibroid."  Most recent lab results (02/18/2019) of CBC is as follows: all values are WNL except for glucose at 121, calcium at 8.7, AST at 49, and ALT at 53.  On review of systems, pt reports pedal edema and denies chest pain, shortness of breath, weight loss, and any other symptoms.  Her last known mammography was on 06/18/2012.   On Social Hx the pt reports that she is a smoker and she smokes about half a pack per day. She does not drink alcohol. She has three children: age 43, 58, and 12.  On Family Hx the pt reports lung cancer. Maternal Cousin passed from stomach cancer at the age of 67.    INTERVAL HISTORY: Brandy [name]  is a 55 y.o. female here for evaluation and management of metastatic breast cancer. The patient's last visit with [place] was on 05/07/2020. The pt reports that she is doing well overall.  The pt reports ***  Of note since the patient's last visit, pt has had *** completed on *** with results revealing ***.  Lab results today (06/08/20) of CBC w/diff and CMP is as follows: all values are WNL except for ***.  On review of systems, pt reports *** and denies ***and any other symptoms.   A&P: -Discussed pt labwork today, 06/08/20; *** -***  MEDICAL HISTORY:  Past Medical History:  Diagnosis Date  . Acute pansinusitis 08/02/2017  . Arthritis   . ASD (atrial septal defect)    s/p closure with Amplatzer device 10/05/04 (Dr. Myriam Jacobson, [place]) 10/05/04  . Cancer ([place])   . Cataract   . GERD (gastroesophageal reflux disease)   . Headache   . Heart murmur    no longer heard  . Legally blind in right eye, as defined in [place]   . Lumbar herniated disc   . Sciatica   Uterine Fibroids  SURGICAL HISTORY: Past Surgical History:  Procedure Laterality Date  . ABLATION    . BREAST SURGERY Bilateral 2011   Breast Reduction Surgery  . BUNIONECTOMY    . CARDIAC CATHETERIZATION     10/05/04 [facility] LM < 25%, otherwise normal coronaries. No pulmonary HTN, Mildly enlarged RV. Secundum ASD s/p closure.  Marland Kitchen CARDIAC SURGERY    . CATARACT EXTRACTION    .  CLEFT PALATE REPAIR     s/p cleft lip and palate repair  . LUMBAR LAMINECTOMY/DECOMPRESSION MICRODISCECTOMY Left 05/23/2016   Procedure: LEFT L4-L5 LATERAL RECESS DECOMPRESSION WITH CENTRAL AND RIGHT DECOMPRESSION VIA LEFT SIDE;  Surgeon: [name], MD;  Location: [place];  Service: Orthopedics;  Laterality: Left;  . LUMBAR LAMINECTOMY/DECOMPRESSION MICRODISCECTOMY Left 05/23/2016   Procedure: LUMBAR LAMINECTOMY/DECOMPRESSION MICRODISCECTOMY Lumbar five - Sacral One 1 LEVEL;  Surgeon: [name], MD;  Location: [place];  Service: Orthopedics;   Laterality: Left;  . SHOULDER INJECTION Left 05/23/2016   Procedure: SHOULDER INJECTION;  Surgeon: [name], MD;  Location: [place];  Service: Orthopedics;  Laterality: Left;  band-aid per pa-c  . TRANSTHORACIC ECHOCARDIOGRAM     12/15/05 [facility]): Mild LVH, EF > 36%, grade 1 diastolic dysfunction, Trivial MR/PR/TR.  Endometrial ablation 2003 Breast Reduction, bilateral 2011  SOCIAL HISTORY: Social History   Socioeconomic History  . Marital status: Married    Spouse name: Not on file  . Number of children: 3  . Years of education: Not on file  . Highest education level: Not on file  Occupational History  . Not on file  Tobacco Use  . Smoking status: Former Smoker    Packs/day: 0.25    Years: 25.00    Pack years: 6.25    Types: Cigarettes    Quit date: 02/05/2019    Years since quitting: 1.3  . Smokeless tobacco: Never Used  Vaping Use  . Vaping Use: Never used  Substance and Sexual Activity  . Alcohol use: No  . Drug use: No  . Sexual activity: Not on file  Other Topics Concern  . Not on file  Social History Narrative  . Not on file   Social Determinants of Health   Financial Resource Strain: Not on file  Food Insecurity: Not on file  Transportation Needs: Not on file  Physical Activity: Not on file  Stress: Not on file  Social Connections: Not on file  Intimate Partner Violence: Not on file    FAMILY HISTORY: Family History  Problem Relation Age of Onset  . Diabetes Mother   . High blood pressure Mother   . Cancer Father        Lung  . Colon cancer Neg Hx   . Colon polyps Neg Hx   . Esophageal cancer Neg Hx   . Rectal cancer Neg Hx   . Stomach cancer Neg Hx     ALLERGIES:  is allergic to zithromax [azithromycin] and psyllium.  MEDICATIONS:  Current Outpatient Medications  Medication Sig Dispense Refill  . albuterol (PROVENTIL) (2.5 MG/3ML) 0.083% nebulizer solution Take 2.5 mg by nebulization every 6 (six) hours as needed for wheezing or shortness  of breath.    . ALPRAZolam (XANAX) 1 MG tablet Take 1 mg by mouth at bedtime.     Marland Kitchen amitriptyline (ELAVIL) 75 MG tablet Take 75 mg by mouth at bedtime.     . benzonatate (TESSALON) 200 MG capsule Take 200 mg by mouth 3 (three) times daily as needed for cough.    Marland Kitchen guaiFENesin (MUCINEX) 600 MG 12 hr tablet Take 1 tablet (600 mg total) by mouth 2 (two) times daily. (Patient taking differently: Take 600 mg by mouth 2 (two) times daily as needed for cough. ) 60 tablet 2  . HYDROcodone-acetaminophen (NORCO) 5-325 MG tablet Take 1-2 tablets by mouth every 6 (six) hours as needed for moderate pain. 30 tablet 0  . Lidocaine HCl (BURN RELIEF)  1 % GEL Apply 1 application topically as needed (abcess).    Marland Kitchen loperamide (IMODIUM) 2 MG capsule Take 2 mg by mouth as needed for diarrhea or loose stools.    . Magnesium 500 MG TABS Take 1 tablet (500 mg total) by mouth in the morning and at bedtime. 60 tablet 1  . mupirocin ointment (BACTROBAN) 2 % Apply 1 application topically 2 (two) times daily. To area over rt gluteal region 22 g 1  . omeprazole (PRILOSEC) 10 MG capsule Take 10 mg by mouth daily.     . ondansetron (ZOFRAN) 8 MG tablet Take 1 tablet (8 mg total) by mouth every 8 (eight) hours as needed for nausea or vomiting. 30 tablet 3  . VERZENIO 100 MG tablet TAKE 1 TABLET (100 MG TOTAL) BY MOUTH 2 (TWO) TIMES DAILY. 56 tablet 0  . Vitamin D, Ergocalciferol, (DRISDOL) 1.25 MG (50000 UNIT) CAPS capsule TAKE 1 CAPSULE BY MOUTH 1 TIME A WEEK (Patient taking differently: Take 50,000 Units by mouth every 7 (seven) days. ) 12 capsule 3   Current Facility-Administered Medications  Medication Dose Route Frequency Provider Last Rate Last Admin  . 0.9 %  sodium chloride infusion  500 mL Intravenous Continuous Nandigam, Venia Minks, MD        REVIEW OF SYSTEMS:   A 10+ POINT REVIEW OF SYSTEMS WAS OBTAINED including neurology, dermatology, psychiatry, cardiac, respiratory, lymph, extremities, GI, GU, Musculoskeletal,  constitutional, breasts, reproductive, HEENT.  All pertinent positives are noted in the HPI.  All others are negative.    PHYSICAL EXAMINATION: ECOG FS:1 - Symptomatic but completely ambulatory  There were no vitals filed for this visit. Wt Readings from Last 3 Encounters:  05/07/20 226 lb 9.6 oz (102.8 kg)  04/21/20 225 lb 3.2 oz (102.2 kg)  04/15/20 228 lb 9.6 oz (103.7 kg)   There is no height or weight on file to calculate BMI.    *** GENERAL:alert, in no acute distress and comfortable SKIN: no acute rashes, no significant lesions EYES: conjunctiva are pink and non-injected, sclera anicteric OROPHARYNX: MMM, no exudates, no oropharyngeal erythema or ulceration NECK: supple, no JVD LYMPH:  no palpable lymphadenopathy in the cervical, axillary or inguinal regions LUNGS: clear to auscultation b/l with normal respiratory effort HEART: regular rate & rhythm ABDOMEN:  normoactive bowel sounds , non tender, not distended. No palpable hepatosplenomegaly.  Extremity: no pedal edema PSYCH: alert & oriented x 3 with fluent speech NEURO: no focal motor/sensory deficits  LABORATORY DATA:  I have reviewed the data as listed  . CBC Latest Ref Rng & Units 05/21/2020 05/07/2020 04/21/2020  WBC 4.0 - 10.5 K/uL 5.5 4.4 4.5  Hemoglobin 12.0 - 15.0 g/dL 11.1(L) 11.3(L) 10.4(L)  Hematocrit 36.0 - 46.0 % 33.1(L) 34.8(L) 31.4(L)  Platelets 150 - 400 K/uL 223 286 298    . CMP Latest Ref Rng & Units 05/07/2020 04/21/2020 03/23/2020  Glucose 70 - 99 mg/dL 116(H) 132(H) 90  BUN 6 - 20 mg/dL _0 Creatinine 0.44 - 1.00 mg/dL 1.27(H) 1.35(H) 1.34(H)  Sodium 135 - 145 mmol/L 142 139 142  Potassium 3.5 - 5.1 mmol/L 3.9 3.5 4.0  Chloride 98 - 111 mmol/L 106 105 106  CO2 22 - 32 mmol/L _1 Calcium 8.9 - 10.3 mg/dL 9.2 8.6(L) 9.5  Total Protein 6.5 - 8.1 g/dL 7.1 - 7.4  Total Bilirubin 0.3 - 1.2 mg/dL 0.6 - 0.2(L)  Alkaline Phos 38 - 126 U/L 104 - 159(H)  AST  15 - 41 U/L 61(H) - 67(H)   ALT 0 - 44 U/L 56(H) - 72(H)   Component     Latest Ref Rng & Units 03/08/2019 03/22/2019  FSH     mIU/mL 65.5   LH     mIU/mL 46.6   Estrogen     pg/mL 82   Progesterone     ng/mL <0.1   Estradiol     pg/mL  <5.0        RADIOGRAPHIC STUDIES: I have personally reviewed the radiological images as listed and agreed with the findings in the report. CT Chest High Resolution  Result Date: 05/22/2020 CLINICAL DATA:  55 year old female with history of chronic shortness of breath with exertion. Evaluate for potential interstitial lung disease. EXAM: CT CHEST WITHOUT CONTRAST TECHNIQUE: Multidetector CT imaging of the chest was performed following the standard protocol without intravenous contrast. High resolution imaging of the lungs, as well as inspiratory and expiratory imaging, was performed. COMPARISON:  Chest CT 11/01/2019. FINDINGS: Cardiovascular: Heart size is normal. There is no significant pericardial fluid, thickening or pericardial calcification. Aortic atherosclerosis. No definite coronary artery calcifications. Status post ASD closure with Amplatzer closure device in place. Mild dilatation of the pulmonic trunk (3.4 cm in diameter). Mediastinum/Nodes: No pathologically enlarged mediastinal or hilar lymph nodes. Please note that accurate exclusion of hilar adenopathy is limited on noncontrast CT scans. Esophagus is unremarkable in appearance. No axillary lymphadenopathy. Lungs/Pleura: High-resolution images demonstrate no significant regions of ground-glass attenuation, septal thickening, subpleural reticulation, parenchymal banding, traction bronchiectasis or frank honeycombing to indicate interstitial lung disease. Inspiratory and expiratory imaging demonstrates mild air trapping indicative of mild small airways disease. No acute consolidative airspace disease. No pleural effusions. A few scattered tiny 2-3 mm pulmonary nodules are noted in the lungs bilaterally. In addition,  however, there is an irregular-shaped macrolobulated nodule with spiculated margins in the apex of the right upper lobe (axial image 24 of series 4 and sagittal image 35 of series 8) measuring 1.4 x 1.0 x 0.8 cm. Upper Abdomen: Unremarkable. Musculoskeletal: There are no aggressive appearing lytic or blastic lesions noted in the visualized portions of the skeleton. IMPRESSION: 1. No imaging findings of interstitial lung disease. 2. 1.4 x 1.0 x 0.8 cm aggressive appearing nodule near the apex of the right upper lobe. In this location this could simply represent an area of pleuroparenchymal scarring from prior infection, however, close attention on follow-up studies is recommended. Consider one of the following in 3 months for both low-risk and high-risk individuals: (a) repeat chest CT or (b) follow-up PET-CT. This recommendation follows the consensus statement: Guidelines for Management of Incidental Pulmonary Nodules Detected on CT Images: From the Fleischner Society 2017; Radiology 2017; 284:228-243. 3. Mild air trapping indicative of small airways disease. 4. Mild dilatation of the pulmonic trunk (3.4 cm in diameter), concerning for pulmonary arterial hypertension. 5. Aortic atherosclerosis. These results will be called to the ordering clinician or representative by the Radiologist Assistant, and communication documented in the PACS or [employer]. Aortic Atherosclerosis (ICD10-I70.0). Electronically Signed   By: Vinnie Langton M.D.   On: 05/22/2020 09:01    ASSESSMENT & PLAN:   Brandy [name] is a 55 y.o. female with:  1. Metastatic breast cancer ER+/PRneg/Her2 neg  02/28/2019 neck CT with results revealing "negative for mass or adenopathy in the neck."  03/04/2019 head MRI with results revealing "Negative for metastatic disease.  No acute abnormality in the brain."  2. Likely Pulmonary metastases  02/18/2019 chest  and abdomen with results revealing "5cm spiculated soft tissue mass in  inferior right breast, highly suspicious for primary breast carcinoma. No acute findings or metastatic disease within the abdomen or pelvis. Multiple small pulmonary nodules in both lung bases, consistent with pulmonary metastases. 4.5 cm uterine fibroid."  02/28/2019 C/A/P CT with results revealing "Irregular solid 5.0 cm right breast mass, suspicious for primary right breast malignancy. Innumerable solid pulmonary nodules scattered throughout both lungs, compatible with pulmonary metastases. No evidence of metastatic disease in the abdomen, pelvis or skeleton. Mildly enlarged and probably myomatous uterus. Simple 1.4 cm left adnexal cyst requires no follow-up. This recommendation follows ACR consensus guidelines: White Paper of the ACR Incidental Findings Committee II on Adnexal Findings. J Am Coll Radiol [phone]. Aortic Atherosclerosis (ICD10-I70.0)."  NUCLEAR MEDICINE WHOLE BODY BONE SCAN completed on 03/19/2019 with results revealing "1. No scintigraphic evidence skeletal metastasis. 2. Degenerative bone disease in the posterior elements of the upper and mid lumbar spine."   04/09/2019 Bone Density ([id number]) which revealed "The BMD measured at Femur Neck Right is 1.153 g/cm2 with a T-score of 0.8. This patient is considered normal according to [facility]) criteria. Lumbar spine was not utilized due to advanced degenerative changes. The scan quality is good. Femur Neck Right 04/09/2019 54.7 Normal 0.8 1.153 g/cm2. Left Forearm Radius 33% 04/09/2019 54.7 Normal 0.8 0.949 g/cm2."  08/04/2019 CT Angio Chest ([id number]) revealed "1. No lobar or central pulmonary embolus detected. Exam is limited secondary to respiratory motion. 2. Mild ground-glass attenuation may represent mild pneumonitis or areas of air trapping. 3. Signs of atrial septal closure. 4. Decrease in size and number of bilateral pulmonary nodules, marked response noted on today's exam with the only nodule  remaining near a cm in the right upper lobe and the smaller nodules that were present on the previous examination throughout the chest no longer measurable though the lower lobes are limited by respiratory motion. 5. Decreased size of right breast mass. 6. Probable hepatic steatosis."  3. Abnormal Liver function test -- previous? Fatty liver 07/03/2019 [place] Abd ([id number]) revealed "1. No acute findings. Normal gallbladder. No bile duct dilation. 2. Significant increased liver parenchymal echogenicity consistent with extensive hepatic steatosis."  PLAN: *** -No lab or clinical evidence of Metastatic Breast Cancer progression at this time. -The pt has no prohibitive toxicities from continuing 100 mg Verzenio BID. -Will give half dose of Faslodex today - only in left buttock. The pt has no prohibitive toxicities at this time.   FOLLOW UP: ***   The total time spent in the appt was *** minutes and more than 50% was on counseling and direct patient cares.  All of the patient's questions were answered with apparent satisfaction. The patient knows to call the clinic with any problems, questions or concerns.   Sullivan Lone MD [place] AAHIVMS [facility] Hematology/Oncology Physician [facility]  (Office):       [phone] (Work cell):  [phone] (Fax):           [phone]  06/08/2020 1:28 PM  I, Yevette Edwards, am acting as a scribe for Dr. Sullivan Lone.   {Add Barista Statement}

## 2020-06-08 NOTE — Progress Notes (Signed)
Labs ordered.

## 2020-06-09 ENCOUNTER — Encounter: Payer: Self-pay | Admitting: Hematology

## 2020-06-09 ENCOUNTER — Inpatient Hospital Stay: Payer: 59 | Attending: Hematology

## 2020-06-09 ENCOUNTER — Ambulatory Visit: Payer: 59

## 2020-06-09 ENCOUNTER — Telehealth: Payer: Self-pay | Admitting: *Deleted

## 2020-06-09 ENCOUNTER — Inpatient Hospital Stay: Payer: 59

## 2020-06-09 ENCOUNTER — Inpatient Hospital Stay: Payer: 59 | Admitting: Hematology

## 2020-06-09 NOTE — Telephone Encounter (Signed)
Attempted to contact patient by phone to share Dr. Clyda Greener response to her MyChart question regarding Covid exposure. No answer.  LVM stating would send response by MyChart. Response sent

## 2020-06-12 ENCOUNTER — Other Ambulatory Visit: Payer: 59

## 2020-06-12 ENCOUNTER — Other Ambulatory Visit: Payer: Self-pay

## 2020-06-12 DIAGNOSIS — Z20822 Contact with and (suspected) exposure to covid-19: Secondary | ICD-10-CM

## 2020-06-16 LAB — NOVEL CORONAVIRUS, NAA: SARS-CoV-2, NAA: DETECTED — AB

## 2020-06-17 ENCOUNTER — Telehealth: Payer: Self-pay | Admitting: *Deleted

## 2020-06-17 ENCOUNTER — Telehealth: Payer: Self-pay | Admitting: Hematology

## 2020-06-17 NOTE — Telephone Encounter (Signed)
Called pt per 11/1 sch msg - no answer. Left message for patient to call back  ° °

## 2020-06-17 NOTE — Telephone Encounter (Signed)
Patient had positive Covid test on 1/7. Her next appt is 2/4 - Dr. Irene Limbo informed Contacted patient with Dr. Grier Mitts response: she should stay off venetoclax till need visit to avoid neutropenia and to allow for stable recovery from covid. Patient verbalized understanding and states she was holding medication. She states she has cold like symptoms and has not felt too bad.

## 2020-06-23 ENCOUNTER — Ambulatory Visit: Payer: 59

## 2020-07-02 ENCOUNTER — Telehealth: Payer: Self-pay | Admitting: Hematology

## 2020-07-02 NOTE — Telephone Encounter (Signed)
Scheduled appt per 1/27 sch msg - pt is aware of appts added.

## 2020-07-09 NOTE — Progress Notes (Signed)
HEMATOLOGY/ONCOLOGY CLINIC NOTE  Date of Service: 07/09/2020  Patient Care Team: [facility] as PCP - General Everardo Beals, NP as Nurse Practitioner [facility]  CHIEF COMPLAINTS/PURPOSE OF CONSULTATION:  -f/u for metastatic breast cancer   HISTORY OF PRESENTING ILLNESS:  Brandy [name] is a wonderful 55 y.o. female who has been referred to [place] by [facility], [facility] for evaluation and management of her suspected breast cancer.   The pt reports that she was having increasing low back pain on her right side that would not resolve, so she went to the ED. This pain has not resolved. She notes pain in her hips and knees.   Of note prior to the patient's visit today, pt has had an abdominal limited right upper ultrasound completed on 02/18/2019 with results revealing "Probable hepatic steatosis. No other abnormality seen in the right upper quadrant of the abdomen."  Pt had abdomen and pelvis CT completed on 02/18/2019 with results revealing "5cm spiculated soft tissue mass in inferior right breast, highly suspicious for primary breast carcinoma. No acute findings or metastatic disease within the abdomen or pelvis. Multiple small pulmonary nodules in both lung bases, consistent with pulmonary metastases. 4.5 cm uterine fibroid."  Most recent lab results (02/18/2019) of CBC is as follows: all values are WNL except for glucose at 121, calcium at 8.7, AST at 49, and ALT at 53.  On review of systems, pt reports pedal edema and denies chest pain, shortness of breath, weight loss, and any other symptoms.  Her last known mammography was on 06/18/2012.   On Social Hx the pt reports that she is a smoker and she smokes about half a pack per day. She does not drink alcohol. She has three children: age 45, 81, and 64.  On Family Hx the pt reports lung cancer. Maternal Cousin passed from stomach cancer at the age of 15.    INTERVAL HISTORY:  Brandy [name] is a 55 y.o. female here for evaluation and management of metastatic breast cancer. The patient's last visit with [place] was on 06/08/2020. The pt reports that she is doing well overall.  The pt had a COVID infection around four weeks ago. Her first appt was rescheduled. She was asked to hold the Verzenio while dealing with the infection.  The pt had a CT Chest High resolution ([id number]) on 05/21/2020, which revealed "1. No imaging findings of interstitial lung disease. 2. 1.4 x 1.0 x 0.8 cm aggressive appearing nodule near the apex ofthe right upper lobe. In this location this could simply represent an area of pleuroparenchymal scarring from prior infection, however, close attention on follow-up studies is recommended. Consider one of the following in 3 months for both low-risk and high-risk individuals: (a) repeat chest CT or (b) follow-up PET-CT. This recommendation follows the consensus statement: Guidelines for Management of Incidental Pulmonary Nodules Detected on CT Images: From the Fleischner Society 2017; Radiology 2017; 284:228-243. 3. Mild air trapping indicative of small airways disease. 4. Mild dilatation of the pulmonic trunk (3.4 cm in diameter), concerning for pulmonary arterial hypertension. 5. Aortic atherosclerosis."  The pt reports that her infection with COVID lasted two weeks. She had fevers, chills, coughing, SOB (was improved by nebulizer and did not require oxygen). The pt notes that she did not have to go to the hospital and that her family got it from her granddaughter. The pt did have her booster on 06/24/2020 after she recovered from the infection. She  is still not at her baseline breathing or health levels yet, but is improving.  The pt notes that she has fallen more recently. She landed on her back then side before hitting her head twice. This has led to her back hurting since. The pt denies dizziness, fast paced getting up, and any known causes for this. She uses her  cane and holds onto objects when getting up. The pt notes that one time it felt as though her knee was locked when she tried to get up and move. She notes she often feels like she is falling and has to catch herself. The pt reports that she feels her blood pressure rapidly drops and she gets dizzy.  The pt notes that her right breast is getting larger, more tender, and sore. There is pain inside the breast and along the lower breast. The pt notes slight tenderness in the left breast.  The pt notes frustrations with discontinuing treatment due to infections and surgeries. She is ready to get this taken care of and improved.  Lab results today 07/10/2020 of CBC w/diff and CMP is as follows: all values are WNL except for RBC of 3.71, Hgb of 11.9. CMP in progress. 07/10/2020 Cancer antigen 27.29 in progress. 07/10/2020 Cancer antigen 15-3 in progress.  On review of systems, pt reports syncope episodes, breast tenderness, back pain and denies new lumps/bumps, fevers, chills, night sweats, abdominal pain, leg swelling and any other symptoms.  MEDICAL HISTORY:  Past Medical History:  Diagnosis Date  . Acute pansinusitis 08/02/2017  . Arthritis   . ASD (atrial septal defect)    s/p closure with Amplatzer device 10/05/04 (Dr. Myriam Jacobson, [facility]) 10/05/04  . Cancer ([place])   . Cataract   . GERD (gastroesophageal reflux disease)   . Headache   . Heart murmur    no longer heard  . Legally blind in right eye, as defined in [place]   . Lumbar herniated disc   . Sciatica   Uterine Fibroids  SURGICAL HISTORY: Past Surgical History:  Procedure Laterality Date  . ABLATION    . BREAST SURGERY Bilateral 2011   Breast Reduction Surgery  . BUNIONECTOMY    . CARDIAC CATHETERIZATION     10/05/04 [facility]): LM < 25%, otherwise normal coronaries. No pulmonary HTN, Mildly enlarged RV. Secundum ASD s/p closure.  Marland Kitchen CARDIAC SURGERY    . CATARACT EXTRACTION    . CLEFT PALATE REPAIR     s/p cleft lip and palate  repair  . LUMBAR LAMINECTOMY/DECOMPRESSION MICRODISCECTOMY Left 05/23/2016   Procedure: LEFT L4-L5 LATERAL RECESS DECOMPRESSION WITH CENTRAL AND RIGHT DECOMPRESSION VIA LEFT SIDE;  Surgeon: [name], MD;  Location: [place];  Service: Orthopedics;  Laterality: Left;  . LUMBAR LAMINECTOMY/DECOMPRESSION MICRODISCECTOMY Left 05/23/2016   Procedure: LUMBAR LAMINECTOMY/DECOMPRESSION MICRODISCECTOMY Lumbar five - Sacral One 1 LEVEL;  Surgeon: [name], MD;  Location: [place];  Service: Orthopedics;  Laterality: Left;  . SHOULDER INJECTION Left 05/23/2016   Procedure: SHOULDER INJECTION;  Surgeon: [name], MD;  Location: [place];  Service: Orthopedics;  Laterality: Left;  band-aid per pa-c  . TRANSTHORACIC ECHOCARDIOGRAM     12/15/05 [facility]): Mild LVH, EF > 95%, grade 1 diastolic dysfunction, Trivial MR/PR/TR.  Endometrial ablation 2003 Breast Reduction, bilateral 2011  SOCIAL HISTORY: Social History   Socioeconomic History  . Marital status: Married    Spouse name: Not on file  . Number of children: 3  . Years of education: Not on file  .  Highest education level: Not on file  Occupational History  . Not on file  Tobacco Use  . Smoking status: Former Smoker    Packs/day: 0.25    Years: 25.00    Pack years: 6.25    Types: Cigarettes    Quit date: 02/05/2019    Years since quitting: 1.4  . Smokeless tobacco: Never Used  Vaping Use  . Vaping Use: Never used  Substance and Sexual Activity  . Alcohol use: No  . Drug use: No  . Sexual activity: Not on file  Other Topics Concern  . Not on file  Social History Narrative  . Not on file   Social Determinants of Health   Financial Resource Strain: Not on file  Food Insecurity: Not on file  Transportation Needs: Not on file  Physical Activity: Not on file  Stress: Not on file  Social Connections: Not on file  Intimate Partner Violence: Not on file    FAMILY HISTORY: Family History  Problem Relation Age of Onset  . Diabetes  Mother   . High blood pressure Mother   . Cancer Father        Lung  . Colon cancer Neg Hx   . Colon polyps Neg Hx   . Esophageal cancer Neg Hx   . Rectal cancer Neg Hx   . Stomach cancer Neg Hx     ALLERGIES:  is allergic to zithromax [azithromycin] and psyllium.  MEDICATIONS:  Current Outpatient Medications  Medication Sig Dispense Refill  . albuterol (PROVENTIL) (2.5 MG/3ML) 0.083% nebulizer solution Take 2.5 mg by nebulization every 6 (six) hours as needed for wheezing or shortness of breath.    . ALPRAZolam (XANAX) 1 MG tablet Take 1 mg by mouth at bedtime.     Marland Kitchen amitriptyline (ELAVIL) 75 MG tablet Take 75 mg by mouth at bedtime.     . benzonatate (TESSALON) 200 MG capsule Take 200 mg by mouth 3 (three) times daily as needed for cough.    Marland Kitchen guaiFENesin (MUCINEX) 600 MG 12 hr tablet Take 1 tablet (600 mg total) by mouth 2 (two) times daily. (Patient taking differently: Take 600 mg by mouth 2 (two) times daily as needed for cough. ) 60 tablet 2  . HYDROcodone-acetaminophen (NORCO) 5-325 MG tablet Take 1-2 tablets by mouth every 6 (six) hours as needed for moderate pain. 30 tablet 0  . Lidocaine HCl (BURN RELIEF) 1 % GEL Apply 1 application topically as needed (abcess).    Marland Kitchen loperamide (IMODIUM) 2 MG capsule Take 2 mg by mouth as needed for diarrhea or loose stools.    . Magnesium 500 MG TABS Take 1 tablet (500 mg total) by mouth in the morning and at bedtime. 60 tablet 1  . mupirocin ointment (BACTROBAN) 2 % Apply 1 application topically 2 (two) times daily. To area over rt gluteal region 22 g 1  . omeprazole (PRILOSEC) 10 MG capsule Take 10 mg by mouth daily.     . ondansetron (ZOFRAN) 8 MG tablet Take 1 tablet (8 mg total) by mouth every 8 (eight) hours as needed for nausea or vomiting. 30 tablet 3  . VERZENIO 100 MG tablet TAKE 1 TABLET (100 MG TOTAL) BY MOUTH 2 (TWO) TIMES DAILY. 56 tablet 0  . Vitamin D, Ergocalciferol, (DRISDOL) 1.25 MG (50000 UNIT) CAPS capsule TAKE 1 CAPSULE  BY MOUTH 1 TIME A WEEK (Patient taking differently: Take 50,000 Units by mouth every 7 (seven) days. ) 12 capsule 3   Current Facility-Administered  Medications  Medication Dose Route Frequency Provider Last Rate Last Admin  . 0.9 %  sodium chloride infusion  500 mL Intravenous Continuous Nandigam, Venia Minks, MD        REVIEW OF SYSTEMS:   10 Point review of Systems was done is negative except as noted above.  PHYSICAL EXAMINATION: ECOG FS:1 - Symptomatic but completely ambulatory  Vitals:   07/10/20 0944  BP: 140/85  Pulse: (!) 106  Resp: 18  Temp: 97.7 F (36.5 C)  SpO2: 96%   Wt Readings from Last 3 Encounters:  05/07/20 226 lb 9.6 oz (102.8 kg)  04/21/20 225 lb 3.2 oz (102.2 kg)  04/15/20 228 lb 9.6 oz (103.7 kg)   Body mass index is 35.55 kg/m.    GENERAL:alert, in no acute distress and comfortable SKIN: no acute rashes, no significant lesions EYES: conjunctiva are pink and non-injected, sclera anicteric OROPHARYNX: MMM, no exudates, no oropharyngeal erythema or ulceration NECK: supple, no JVD LYMPH:  no palpable lymphadenopathy in the cervical, axillary or inguinal regions LUNGS: clear to auscultation b/l with normal respiratory effort HEART: regular rate & rhythm ABDOMEN:  normoactive bowel sounds , non tender, not distended. Extremity: no pedal edema PSYCH: alert & oriented x 3 with fluent speech NEURO: no focal motor/sensory deficits  LABORATORY DATA:  I have reviewed the data as listed  . CBC Latest Ref Rng & Units 07/10/2020 05/21/2020 05/07/2020  WBC 4.0 - 10.5 K/uL 5.6 5.5 4.4  Hemoglobin 12.0 - 15.0 g/dL 11.9(L) 11.1(L) 11.3(L)  Hematocrit 36.0 - 46.0 % 36.7 33.1(L) 34.8(L)  Platelets 150 - 400 K/uL 286 223 286    . CMP Latest Ref Rng & Units 07/10/2020 05/07/2020 04/21/2020  Glucose 70 - 99 mg/dL 118(H) 116(H) 132(H)  BUN 6 - 20 mg/dL 12 11 13   Creatinine 0.44 - 1.00 mg/dL 1.08(H) 1.27(H) 1.35(H)  Sodium 135 - 145 mmol/L 141 142 139  Potassium 3.5 -  5.1 mmol/L 3.9 3.9 3.5  Chloride 98 - 111 mmol/L 102 106 105  CO2 22 - 32 mmol/L 30 27 23   Calcium 8.9 - 10.3 mg/dL 9.2 9.2 8.6(L)  Total Protein 6.5 - 8.1 g/dL 7.4 7.1 -  Total Bilirubin 0.3 - 1.2 mg/dL 0.5 0.6 -  Alkaline Phos 38 - 126 U/L 154(H) 104 -  AST 15 - 41 U/L 117(H) 61(H) -  ALT 0 - 44 U/L 95(H) 56(H) -   Component     Latest Ref Rng & Units 03/08/2019 03/22/2019  FSH     mIU/mL 65.5   LH     mIU/mL 46.6   Estrogen     pg/mL 82   Progesterone     ng/mL <0.1   Estradiol     pg/mL  <5.0        RADIOGRAPHIC STUDIES: I have personally reviewed the radiological images as listed and agreed with the findings in the report. No results found.  ASSESSMENT & PLAN:   Brandy [name] is a 55 y.o. female with:  1. Metastatic breast cancer ER+/PRneg/Her2 neg  02/28/2019 neck CT with results revealing "negative for mass or adenopathy in the neck."  03/04/2019 head MRI with results revealing "Negative for metastatic disease.  No acute abnormality in the brain."  2. Likely Pulmonary metastases  02/18/2019 chest and abdomen with results revealing "5cm spiculated soft tissue mass in inferior right breast, highly suspicious for primary breast carcinoma. No acute findings or metastatic disease within the abdomen or pelvis. Multiple small pulmonary nodules in both  lung bases, consistent with pulmonary metastases. 4.5 cm uterine fibroid."  02/28/2019 C/A/P CT with results revealing "Irregular solid 5.0 cm right breast mass, suspicious for primary right breast malignancy. Innumerable solid pulmonary nodules scattered throughout both lungs, compatible with pulmonary metastases. No evidence of metastatic disease in the abdomen, pelvis or skeleton. Mildly enlarged and probably myomatous uterus. Simple 1.4 cm left adnexal cyst requires no follow-up. This recommendation follows ACR consensus guidelines: White Paper of the ACR Incidental Findings Committee II on Adnexal Findings. J Am  Coll Radiol [phone]. Aortic Atherosclerosis (ICD10-I70.0)."  NUCLEAR MEDICINE WHOLE BODY BONE SCAN completed on 03/19/2019 with results revealing "1. No scintigraphic evidence skeletal metastasis. 2. Degenerative bone disease in the posterior elements of the upper and mid lumbar spine."   04/09/2019 Bone Density ([id number]) which revealed "The BMD measured at Femur Neck Right is 1.153 g/cm2 with a T-score of 0.8. This patient is considered normal according to [facility]) criteria. Lumbar spine was not utilized due to advanced degenerative changes. The scan quality is good. Femur Neck Right 04/09/2019 54.7 Normal 0.8 1.153 g/cm2. Left Forearm Radius 33% 04/09/2019 54.7 Normal 0.8 0.949 g/cm2."  08/04/2019 CT Angio Chest ([id number]) revealed "1. No lobar or central pulmonary embolus detected. Exam is limited secondary to respiratory motion. 2. Mild ground-glass attenuation may represent mild pneumonitis or areas of air trapping. 3. Signs of atrial septal closure. 4. Decrease in size and number of bilateral pulmonary nodules, marked response noted on today's exam with the only nodule remaining near a cm in the right upper lobe and the smaller nodules that were present on the previous examination throughout the chest no longer measurable though the lower lobes are limited by respiratory motion. 5. Decreased size of right breast mass. 6. Probable hepatic steatosis."  3. Abnormal Liver function test -- previous? Fatty liver 07/03/2019 [place] Abd ([id number]) revealed "1. No acute findings. Normal gallbladder. No bile duct dilation. 2. Significant increased liver parenchymal echogenicity consistent with extensive hepatic steatosis."  PLAN: -Discussed pt labwork today, 07/10/2020; blood counts stable. -Discussed pt CT Chest 05/21/2020; one new nodule found. Will have to get PET scan six weeks following COVID infection. -Recommend pt continue to use cane or walker to move  around. -Recommend pt f/u w PCP ASAP regarding syncope episodes.  -Will get repeat mammogram. The pt's last one was 03/01/2019. -Recommend high protein diet. The pt notes she will start this diet on Monday.  -Will continue Faslodex. The pt's last dose was in December. The pt is agreeable to continue this today. $RemoveBef'250mg'[id number]$  today if no reaction will increase to $RemoveBef'500mg'IDuMUhwcjC$  q4weeks from next dose -Will restart pt on 100 mg Verzenio BID. The pt has not been on it since her COVID infection. -Continue Prilosec BID -PET in 25 days, MMG in 25 days. -Will see back in 4 weeks with labs with next dose of Faslodex.   FOLLOW UP: PET CT scan in 25 days Bilateral mammogram/tomosynthesis in 25 days Please schedule next 3 cycles of every 4 weekly Faslodex with labs. MD visit in 4 weeks with her next dose of Faslodex to review her scans and determine further treatment.    The total time spent in the appointment was 30 minutes and more than 50% was on counseling and direct patient cares.  All of the patient's questions were answered with apparent satisfaction. The patient knows to call the clinic with any problems, questions or concerns.  Sullivan Lone MD MS AAHIVMS [facility] Hematology/Oncology Physician [name]  (  Office):       [phone] (Work cell):  [phone] (Fax):           [phone]  07/09/2020 12:46 PM  I, Reinaldo Raddle, am acting as scribe for Dr. Sullivan Lone, MD.    .I have reviewed the above documentation for accuracy and completeness, and I agree with the above. Brunetta Genera MD

## 2020-07-10 ENCOUNTER — Inpatient Hospital Stay: Payer: 59

## 2020-07-10 ENCOUNTER — Encounter: Payer: Self-pay | Admitting: Licensed Clinical Social Worker

## 2020-07-10 ENCOUNTER — Other Ambulatory Visit: Payer: Self-pay

## 2020-07-10 ENCOUNTER — Inpatient Hospital Stay: Payer: 59 | Attending: Hematology

## 2020-07-10 ENCOUNTER — Inpatient Hospital Stay (HOSPITAL_BASED_OUTPATIENT_CLINIC_OR_DEPARTMENT_OTHER): Payer: 59 | Admitting: Hematology

## 2020-07-10 VITALS — BP 140/85 | HR 106 | Temp 97.7°F | Resp 18 | Ht 67.0 in | Wt 227.0 lb

## 2020-07-10 VITALS — HR 96 | Resp 18

## 2020-07-10 DIAGNOSIS — Z170 Estrogen receptor positive status [ER+]: Secondary | ICD-10-CM | POA: Insufficient documentation

## 2020-07-10 DIAGNOSIS — R7989 Other specified abnormal findings of blood chemistry: Secondary | ICD-10-CM | POA: Insufficient documentation

## 2020-07-10 DIAGNOSIS — Z79899 Other long term (current) drug therapy: Secondary | ICD-10-CM | POA: Diagnosis not present

## 2020-07-10 DIAGNOSIS — R911 Solitary pulmonary nodule: Secondary | ICD-10-CM | POA: Diagnosis not present

## 2020-07-10 DIAGNOSIS — C7801 Secondary malignant neoplasm of right lung: Secondary | ICD-10-CM | POA: Insufficient documentation

## 2020-07-10 DIAGNOSIS — I700 Atherosclerosis of aorta: Secondary | ICD-10-CM | POA: Diagnosis not present

## 2020-07-10 DIAGNOSIS — Z8249 Family history of ischemic heart disease and other diseases of the circulatory system: Secondary | ICD-10-CM | POA: Insufficient documentation

## 2020-07-10 DIAGNOSIS — M549 Dorsalgia, unspecified: Secondary | ICD-10-CM | POA: Diagnosis not present

## 2020-07-10 DIAGNOSIS — Z87891 Personal history of nicotine dependence: Secondary | ICD-10-CM | POA: Diagnosis not present

## 2020-07-10 DIAGNOSIS — R945 Abnormal results of liver function studies: Secondary | ICD-10-CM

## 2020-07-10 DIAGNOSIS — Z833 Family history of diabetes mellitus: Secondary | ICD-10-CM | POA: Diagnosis not present

## 2020-07-10 DIAGNOSIS — C50919 Malignant neoplasm of unspecified site of unspecified female breast: Secondary | ICD-10-CM | POA: Diagnosis not present

## 2020-07-10 DIAGNOSIS — C7802 Secondary malignant neoplasm of left lung: Secondary | ICD-10-CM | POA: Diagnosis not present

## 2020-07-10 DIAGNOSIS — K760 Fatty (change of) liver, not elsewhere classified: Secondary | ICD-10-CM | POA: Insufficient documentation

## 2020-07-10 DIAGNOSIS — Z5111 Encounter for antineoplastic chemotherapy: Secondary | ICD-10-CM | POA: Insufficient documentation

## 2020-07-10 DIAGNOSIS — Z801 Family history of malignant neoplasm of trachea, bronchus and lung: Secondary | ICD-10-CM | POA: Insufficient documentation

## 2020-07-10 DIAGNOSIS — C50811 Malignant neoplasm of overlapping sites of right female breast: Secondary | ICD-10-CM | POA: Diagnosis present

## 2020-07-10 LAB — CMP (CANCER CENTER ONLY)
ALT: 95 U/L — ABNORMAL HIGH (ref 0–44)
AST: 117 U/L — ABNORMAL HIGH (ref 15–41)
Albumin: 3.7 g/dL (ref 3.5–5.0)
Alkaline Phosphatase: 154 U/L — ABNORMAL HIGH (ref 38–126)
Anion gap: 9 (ref 5–15)
BUN: 12 mg/dL (ref 6–20)
CO2: 30 mmol/L (ref 22–32)
Calcium: 9.2 mg/dL (ref 8.9–10.3)
Chloride: 102 mmol/L (ref 98–111)
Creatinine: 1.08 mg/dL — ABNORMAL HIGH (ref 0.44–1.00)
GFR, Estimated: >60 mL/min (ref >60)
Glucose, Bld: 118 mg/dL — ABNORMAL HIGH (ref 70–99)
Potassium: 3.9 mmol/L (ref 3.5–5.1)
Sodium: 141 mmol/L (ref 135–145)
Total Bilirubin: 0.5 mg/dL (ref 0.3–1.2)
Total Protein: 7.4 g/dL (ref 6.5–8.1)

## 2020-07-10 LAB — CBC WITH DIFFERENTIAL/PLATELET
Abs Immature Granulocytes: 0.01 10*3/uL (ref 0.00–0.07)
Basophils Absolute: 0 10*3/uL (ref 0.0–0.1)
Basophils Relative: 1 %
Eosinophils Absolute: 0.1 10*3/uL (ref 0.0–0.5)
Eosinophils Relative: 2 %
HCT: 36.7 % (ref 36.0–46.0)
Hemoglobin: 11.9 g/dL — ABNORMAL LOW (ref 12.0–15.0)
Immature Granulocytes: 0 %
Lymphocytes Relative: 55 %
Lymphs Abs: 3.1 10*3/uL (ref 0.7–4.0)
MCH: 32.1 pg (ref 26.0–34.0)
MCHC: 32.4 g/dL (ref 30.0–36.0)
MCV: 98.9 fL (ref 80.0–100.0)
Monocytes Absolute: 0.3 10*3/uL (ref 0.1–1.0)
Monocytes Relative: 6 %
Neutro Abs: 2 10*3/uL (ref 1.7–7.7)
Neutrophils Relative %: 36 %
Platelets: 286 10*3/uL (ref 150–400)
RBC: 3.71 MIL/uL — ABNORMAL LOW (ref 3.87–5.11)
RDW: 13.6 % (ref 11.5–15.5)
WBC: 5.6 10*3/uL (ref 4.0–10.5)
nRBC: 0 % (ref 0.0–0.2)

## 2020-07-10 MED ORDER — FULVESTRANT 250 MG/5ML IM SOLN
INTRAMUSCULAR | Status: AC
  Filled 2020-07-10: qty 5

## 2020-07-10 MED ORDER — FAMOTIDINE 20 MG PO TABS
ORAL_TABLET | ORAL | Status: AC
  Filled 2020-07-10: qty 1

## 2020-07-10 MED ORDER — FAMOTIDINE 20 MG PO TABS
20.0000 mg | ORAL_TABLET | Freq: Once | ORAL | Status: AC
  Administered 2020-07-10: 20 mg via ORAL

## 2020-07-10 MED ORDER — FULVESTRANT 250 MG/5ML IM SOLN
250.0000 mg | Freq: Once | INTRAMUSCULAR | Status: AC
  Administered 2020-07-10: 250 mg via INTRAMUSCULAR

## 2020-07-10 MED ORDER — DIPHENHYDRAMINE HCL 25 MG PO CAPS
25.0000 mg | ORAL_CAPSULE | Freq: Once | ORAL | Status: AC
Start: 2020-07-10 — End: 2020-07-10
  Administered 2020-07-10: 25 mg via ORAL

## 2020-07-10 MED ORDER — DIPHENHYDRAMINE HCL 25 MG PO CAPS
ORAL_CAPSULE | ORAL | Status: AC
  Filled 2020-07-10: qty 1

## 2020-07-10 MED ORDER — IBUPROFEN 200 MG PO TABS
400.0000 mg | ORAL_TABLET | Freq: Once | ORAL | Status: AC
Start: 2020-07-10 — End: 2020-07-10
  Administered 2020-07-10: 400 mg via ORAL

## 2020-07-10 MED ORDER — IBUPROFEN 200 MG PO TABS
ORAL_TABLET | ORAL | Status: AC
  Filled 2020-07-10: qty 2

## 2020-07-10 NOTE — Patient Instructions (Signed)
Fulvestrant injection What is this medicine? FULVESTRANT (ful VES trant) blocks the effects of estrogen. It is used to treat breast cancer. This medicine may be used for other purposes; ask your health care provider or pharmacist if you have questions. COMMON BRAND NAME(S): FASLODEX What should I tell my health care provider before I take this medicine? They need to know if you have any of these conditions:  bleeding disorders  liver disease  low blood counts, like low white cell, platelet, or red cell counts  an unusual or allergic reaction to fulvestrant, other medicines, foods, dyes, or preservatives  pregnant or trying to get pregnant  breast-feeding How should I use this medicine? This medicine is for injection into a muscle. It is usually given by a health care professional in a hospital or clinic setting. Talk to your pediatrician regarding the use of this medicine in children. Special care may be needed. Overdosage: If you think you have taken too much of this medicine contact a poison control center or emergency room at once. NOTE: This medicine is only for you. Do not share this medicine with others. What if I miss a dose? It is important not to miss your dose. Call your doctor or health care professional if you are unable to keep an appointment. What may interact with this medicine?  medicines that treat or prevent blood clots like warfarin, enoxaparin, dalteparin, apixaban, dabigatran, and rivaroxaban This list may not describe all possible interactions. Give your health care provider a list of all the medicines, herbs, non-prescription drugs, or dietary supplements you use. Also tell them if you smoke, drink alcohol, or use illegal drugs. Some items may interact with your medicine. What should I watch for while using this medicine? Your condition will be monitored carefully while you are receiving this medicine. You will need important blood work done while you are taking  this medicine. Do not become pregnant while taking this medicine or for at least 1 year after stopping it. Women of child-bearing potential will need to have a negative pregnancy test before starting this medicine. Women should inform their doctor if they wish to become pregnant or think they might be pregnant. There is a potential for serious side effects to an unborn child. Men should inform their doctors if they wish to father a child. This medicine may lower sperm counts. Talk to your health care professional or pharmacist for more information. Do not breast-feed an infant while taking this medicine or for 1 year after the last dose. What side effects may I notice from receiving this medicine? Side effects that you should report to your doctor or health care professional as soon as possible:  allergic reactions like skin rash, itching or hives, swelling of the face, lips, or tongue  feeling faint or lightheaded, falls  pain, tingling, numbness, or weakness in the legs  signs and symptoms of infection like fever or chills; cough; flu-like symptoms; sore throat  vaginal bleeding Side effects that usually do not require medical attention (report to your doctor or health care professional if they continue or are bothersome):  aches, pains  constipation  diarrhea  headache  hot flashes  nausea, vomiting  pain at site where injected  stomach pain This list may not describe all possible side effects. Call your doctor for medical advice about side effects. You may report side effects to FDA at 1-800-FDA-1088. Where should I keep my medicine? This drug is given in a hospital or clinic and will   not be stored at home. NOTE: This sheet is a summary. It may not cover all possible information. If you have questions about this medicine, talk to your doctor, pharmacist, or health care provider.  2021 Elsevier/Gold Standard (2017-08-31 11:34:41)  

## 2020-07-10 NOTE — Progress Notes (Signed)
Per Dr. Irene Limbo - will proceed with 250 mg Faslodex injection today

## 2020-07-10 NOTE — Progress Notes (Signed)
[place] Psychosocial Distress Screening Clinical Social Work  Clinical Social Work was referred by distress screening protocol.  The patient scored a 5 on the Psychosocial Distress Thermometer which indicates moderate distress. Clinical Social Worker contacted patient by phone to assess for distress and other psychosocial needs.   Patient reports that her adult children have moved home which has been a major adjustment. They are also having issues with the house needing repairs (they own the home) and are having trouble affording them. Patient does receive social security disability.  CSW shared information on potential avenues for assistance. Mailed information on [employer] and [employer] for potential home repairs if qualified. Provided information on breast cancer foundations and [employer] for additional support.  In addition, patient wants to lose weight but is having trouble figuring out where to start. She is considering restarting yoga through [place] and would like information from the dietitians on good recipes or advice for cancer patients. CSW sent request to [facility].   ONCBCN DISTRESS SCREENING 07/10/2020  Screening Type Initial Screening  Distress experienced in past week (1-10) 5  Practical problem type Housing  Family Problem type Children  Physical Problem type   Physician notified of physical symptoms   Referral to clinical social work Yes   Patient may contact [place] or [place] with any further questions or needs.    Jolina Symonds E Efstathios Sawin, LCSW

## 2020-07-11 LAB — CANCER ANTIGEN 15-3: CA 15-3: 29 U/mL — ABNORMAL HIGH (ref 0.0–25.0)

## 2020-07-11 LAB — CANCER ANTIGEN 27.29: CA 27.29: 39 U/mL — ABNORMAL HIGH (ref 0.0–38.6)

## 2020-07-13 ENCOUNTER — Telehealth: Payer: Self-pay

## 2020-07-13 NOTE — Telephone Encounter (Addendum)
Nutrition  Referral received from LCSW as patient interested in handouts for healthy eating and recipes for cancer patients.   Chart reviewed. Called patient and no answer. Left message with call back number.   Mailed handouts to patient and contact information included.   Annis Lagoy B. Zenia Resides, [place], [place] Registered Dietitian [phone] (mobile)

## 2020-08-04 ENCOUNTER — Other Ambulatory Visit: Payer: Self-pay | Admitting: Hematology

## 2020-08-04 DIAGNOSIS — C50919 Malignant neoplasm of unspecified site of unspecified female breast: Secondary | ICD-10-CM

## 2020-08-04 NOTE — Telephone Encounter (Signed)
Please review for refill. Thank you! 

## 2020-08-05 ENCOUNTER — Other Ambulatory Visit: Payer: Self-pay | Admitting: Hematology

## 2020-08-06 ENCOUNTER — Other Ambulatory Visit: Payer: Self-pay | Admitting: Pharmacist

## 2020-08-06 DIAGNOSIS — C50919 Malignant neoplasm of unspecified site of unspecified female breast: Secondary | ICD-10-CM

## 2020-08-06 MED ORDER — ABEMACICLIB 100 MG PO TABS: 100.0000 mg | ORAL_TABLET | Freq: Two times a day (BID) | ORAL | 0 refills | Status: DC

## 2020-08-06 NOTE — Progress Notes (Signed)
Oral Oncology Pharmacist Encounter  Prescription refill for Verzenio (abemaciclib) sent to [facility] in error. Patient's insurance now requires Verzenio to be filled through [employer]. Prescription redirected to [facility].  Leron Croak, PharmD, BCPS Hematology/Oncology Clinical Pharmacist [facility] [phone] 08/06/2020 9:31 AM

## 2020-08-06 NOTE — Progress Notes (Signed)
HEMATOLOGY/ONCOLOGY CLINIC NOTE  Date of Service: 08/06/2020  Patient Care Team: [facility] as PCP - General Everardo Beals, NP as Nurse Practitioner [facility]  CHIEF COMPLAINTS/PURPOSE OF CONSULTATION:  -f/u for metastatic breast cancer   HISTORY OF PRESENTING ILLNESS:  Brandy [name] is a wonderful 55 y.o. female who has been referred to [place] by [facility] for evaluation and management of her suspected breast cancer.   The pt reports that she was having increasing low back pain on her right side that would not resolve, so she went to the ED. This pain has not resolved. She notes pain in her hips and knees.   Of note prior to the patient's visit today, pt has had an abdominal limited right upper ultrasound completed on 02/18/2019 with results revealing "Probable hepatic steatosis. No other abnormality seen in the right upper quadrant of the abdomen."  Pt had abdomen and pelvis CT completed on 02/18/2019 with results revealing "5cm spiculated soft tissue mass in inferior right breast, highly suspicious for primary breast carcinoma. No acute findings or metastatic disease within the abdomen or pelvis. Multiple small pulmonary nodules in both lung bases, consistent with pulmonary metastases. 4.5 cm uterine fibroid."  Most recent lab results (02/18/2019) of CBC is as follows: all values are WNL except for glucose at 121, calcium at 8.7, AST at 49, and ALT at 53.  On review of systems, pt reports pedal edema and denies chest pain, shortness of breath, weight loss, and any other symptoms.  Her last known mammography was on 06/18/2012.   On Social Hx the pt reports that she is a smoker and she smokes about half a pack per day. She does not drink alcohol. She has three children: age 22, 35, and 25.  On Family Hx the pt reports lung cancer. Maternal Cousin passed from stomach cancer at the age of 87.    INTERVAL HISTORY:  Brandy [name] is a 55 y.o. female here for evaluation and management of metastatic breast cancer. The patient's last visit with [place] was on 07/10/2020. The pt reports that she is doing well overall.  The pt reports that she has been experiencing worsening diarrhea that is bothersome to her. She believes this is due to her plant base diet. The pt notes she has been trying to drink enough water and eat enough daily. This diarrhea is very bothersome and she experiences slippages where she doesn't even know she went. The pt denies seeing a GI recently. The pt notes it was slighly improved when she was off the [employer]. She notes it is to the point where the Immodium does not even work. She notes four bowel movements daily, large amount, loose stools but not watery. On the bad days, she notes it is continuous all day and around 10x the whole day. She was taking the Immodium 2x after first and 1x after each movement. The pt has not been taking the probiotics. The pt notes that she still intermittently has to eat salmon.  The pt notes that she is not feeling well today due to a migraine that started yesterday. The pt notes her breast pain has subsided. The pt notes her cough and breathing are gradually improving. The stressful environment at home is still unchanged.   Of note since the patient's last visit, pt has had NM PET Skull Base to Thigh on today, 08/07/2020. The results are in progress. The pt is scheduled for her  mammogram on 09/02/2020.   Lab results today 08/07/2020 of CBC w/diff and CMP is as follows: all values are WNL except for RBC of 3.5, Hgb of 11.6, Hct of 34.7, Neutro Abs of 1.2K, Creatinine of 1.32, ALT of 49, GFR est of 47.  On review of systems, pt reports diarrhea and denies hot flashes, blood/mucus in stools, pain while having a bowel habit, breast pain, abdominal pain, back pain, and any other symptoms.  MEDICAL HISTORY:  Past Medical History:  Diagnosis Date  . Acute pansinusitis  08/02/2017  . Arthritis   . ASD (atrial septal defect)    s/p closure with Amplatzer device 10/05/04 (Dr. Myriam Jacobson, [facility]) 10/05/04  . Cancer ([place])   . Cataract   . GERD (gastroesophageal reflux disease)   . Headache   . Heart murmur    no longer heard  . Legally blind in right eye, as defined in [place]   . Lumbar herniated disc   . Sciatica   Uterine Fibroids  SURGICAL HISTORY: Past Surgical History:  Procedure Laterality Date  . ABLATION    . BREAST SURGERY Bilateral 2011   Breast Reduction Surgery  . BUNIONECTOMY    . CARDIAC CATHETERIZATION     10/05/04 [facility]): LM < 25%, otherwise normal coronaries. No pulmonary HTN, Mildly enlarged RV. Secundum ASD s/p closure.  Marland Kitchen CARDIAC SURGERY    . CATARACT EXTRACTION    . CLEFT PALATE REPAIR     s/p cleft lip and palate repair  . LUMBAR LAMINECTOMY/DECOMPRESSION MICRODISCECTOMY Left 05/23/2016   Procedure: LEFT L4-L5 LATERAL RECESS DECOMPRESSION WITH CENTRAL AND RIGHT DECOMPRESSION VIA LEFT SIDE;  Surgeon: [name], MD;  Location: [place];  Service: Orthopedics;  Laterality: Left;  . LUMBAR LAMINECTOMY/DECOMPRESSION MICRODISCECTOMY Left 05/23/2016   Procedure: LUMBAR LAMINECTOMY/DECOMPRESSION MICRODISCECTOMY Lumbar five - Sacral One 1 LEVEL;  Surgeon: [name], MD;  Location: [place];  Service: Orthopedics;  Laterality: Left;  . SHOULDER INJECTION Left 05/23/2016   Procedure: SHOULDER INJECTION;  Surgeon: [name], MD;  Location: [place];  Service: Orthopedics;  Laterality: Left;  band-aid per pa-c  . TRANSTHORACIC ECHOCARDIOGRAM     12/15/05 [facility]): Mild LVH, EF > 64%, grade 1 diastolic dysfunction, Trivial MR/PR/TR.  Endometrial ablation 2003 Breast Reduction, bilateral 2011  SOCIAL HISTORY: Social History   Socioeconomic History  . Marital status: Married    Spouse name: Not on file  . Number of children: 3  . Years of education: Not on file  . Highest education level: Not on file  Occupational History  . Not on  file  Tobacco Use  . Smoking status: Former Smoker    Packs/day: 0.25    Years: 25.00    Pack years: 6.25    Types: Cigarettes    Quit date: 02/05/2019    Years since quitting: 1.5  . Smokeless tobacco: Never Used  Vaping Use  . Vaping Use: Never used  Substance and Sexual Activity  . Alcohol use: No  . Drug use: No  . Sexual activity: Not on file  Other Topics Concern  . Not on file  Social History Narrative  . Not on file   Social Determinants of Health   Financial Resource Strain: Not on file  Food Insecurity: Not on file  Transportation Needs: Not on file  Physical Activity: Not on file  Stress: Not on file  Social Connections: Not on file  Intimate Partner Violence: Not on file    FAMILY HISTORY: Family History  Problem Relation Age of Onset  . Diabetes Mother   . High blood pressure Mother   . Cancer Father        Lung  . Colon cancer Neg Hx   . Colon polyps Neg Hx   . Esophageal cancer Neg Hx   . Rectal cancer Neg Hx   . Stomach cancer Neg Hx     ALLERGIES:  is allergic to zithromax [azithromycin] and psyllium.  MEDICATIONS:  Current Outpatient Medications  Medication Sig Dispense Refill  . abemaciclib (VERZENIO) 100 MG tablet Take 1 tablet (100 mg total) by mouth 2 (two) times daily. Swallow tablets whole. Do not chew, crush, or split tablets before swallowing. 56 tablet 0  . albuterol (PROVENTIL) (2.5 MG/3ML) 0.083% nebulizer solution Take 2.5 mg by nebulization every 6 (six) hours as needed for wheezing or shortness of breath.    . ALPRAZolam (XANAX) 1 MG tablet Take 1 mg by mouth at bedtime.     Marland Kitchen amitriptyline (ELAVIL) 75 MG tablet Take 75 mg by mouth at bedtime.     . benzonatate (TESSALON) 200 MG capsule Take 200 mg by mouth 3 (three) times daily as needed for cough.    . fluconazole (DIFLUCAN) 150 MG tablet Take by mouth.    Marland Kitchen HYDROcodone-acetaminophen (NORCO) 5-325 MG tablet Take 1-2 tablets by mouth every 6 (six) hours as needed for moderate  pain. 30 tablet 0  . Lidocaine HCl (BURN RELIEF) 1 % GEL Apply 1 application topically as needed (abcess).    Marland Kitchen loperamide (IMODIUM) 2 MG capsule Take 2 mg by mouth as needed for diarrhea or loose stools.    . Magnesium 500 MG TABS Take 1 tablet (500 mg total) by mouth in the morning and at bedtime. 60 tablet 1  . mupirocin ointment (BACTROBAN) 2 % Apply 1 application topically 2 (two) times daily. To area over rt gluteal region 22 g 1  . omeprazole (PRILOSEC) 10 MG capsule Take 10 mg by mouth daily.     . ondansetron (ZOFRAN) 8 MG tablet Take 1 tablet (8 mg total) by mouth every 8 (eight) hours as needed for nausea or vomiting. 30 tablet 3  . Vitamin D, Ergocalciferol, (DRISDOL) 1.25 MG (50000 UNIT) CAPS capsule TAKE 1 CAPSULE BY MOUTH 1 TIME A WEEK (Patient taking differently: Take 50,000 Units by mouth every 7 (seven) days. ) 12 capsule 3   Current Facility-Administered Medications  Medication Dose Route Frequency Provider Last Rate Last Admin  . 0.9 %  sodium chloride infusion  500 mL Intravenous Continuous Nandigam, Venia Minks, MD        REVIEW OF SYSTEMS:   10 Point review of Systems was done is negative except as noted above.  PHYSICAL EXAMINATION: ECOG FS:1 - Symptomatic but completely ambulatory  Vitals:   08/07/20 1025  BP: (!) 149/75  Pulse: 87  Resp: 18  Temp: (!) 97.3 F (36.3 C)  SpO2: 98%   Wt Readings from Last 3 Encounters:  07/10/20 227 lb (103 kg)  05/07/20 226 lb 9.6 oz (102.8 kg)  04/21/20 225 lb 3.2 oz (102.2 kg)   Body mass index is 35.55 kg/m.    Exam was given in a wheelchair.  GENERAL:alert, in no acute distress and comfortable SKIN: no acute rashes, no significant lesions EYES: conjunctiva are pink and non-injected, sclera anicteric OROPHARYNX: MMM, no exudates, no oropharyngeal erythema or ulceration NECK: supple, no JVD LYMPH:  no palpable lymphadenopathy in the cervical, axillary or inguinal regions LUNGS: clear to  auscultation b/l with normal  respiratory effort HEART: regular rate & rhythm ABDOMEN:  normoactive bowel sounds , non tender, not distended. Extremity: no pedal edema PSYCH: alert & oriented x 3 with fluent speech NEURO: no focal motor/sensory deficits  LABORATORY DATA:  I have reviewed the data as listed  . CBC Latest Ref Rng & Units 08/07/2020 07/10/2020 05/21/2020  WBC 4.0 - 10.5 K/uL 4.0 5.6 5.5  Hemoglobin 12.0 - 15.0 g/dL 11.6(L) 11.9(L) 11.1(L)  Hematocrit 36.0 - 46.0 % 34.7(L) 36.7 33.1(L)  Platelets 150 - 400 K/uL 214 286 223    . CMP Latest Ref Rng & Units 08/07/2020 07/10/2020 05/07/2020  Glucose 70 - 99 mg/dL 81 118(H) 116(H)  BUN 6 - 20 mg/dL 11 12 11   Creatinine 0.44 - 1.00 mg/dL 1.32(H) 1.08(H) 1.27(H)  Sodium 135 - 145 mmol/L 141 141 142  Potassium 3.5 - 5.1 mmol/L 3.9 3.9 3.9  Chloride 98 - 111 mmol/L 105 102 106  CO2 22 - 32 mmol/L 26 30 27   Calcium 8.9 - 10.3 mg/dL 8.9 9.2 9.2  Total Protein 6.5 - 8.1 g/dL 7.4 7.4 7.1  Total Bilirubin 0.3 - 1.2 mg/dL 0.4 0.5 0.6  Alkaline Phos 38 - 126 U/L 123 154(H) 104  AST 15 - 41 U/L 38 117(H) 61(H)  ALT 0 - 44 U/L 49(H) 95(H) 56(H)   Component     Latest Ref Rng & Units 03/08/2019 03/22/2019  FSH     mIU/mL 65.5   LH     mIU/mL 46.6   Estrogen     pg/mL 82   Progesterone     ng/mL <0.1   Estradiol     pg/mL  <5.0        RADIOGRAPHIC STUDIES: I have personally reviewed the radiological images as listed and agreed with the findings in the report. No results found.  ASSESSMENT & PLAN:   TRINADY MILEWSKI is a 55 y.o. female with:  1. Metastatic breast cancer ER+/PRneg/Her2 neg  02/28/2019 neck CT with results revealing "negative for mass or adenopathy in the neck."  03/04/2019 head MRI with results revealing "Negative for metastatic disease.  No acute abnormality in the brain."  2. Likely Pulmonary metastases  02/18/2019 chest and abdomen with results revealing "5cm spiculated soft tissue mass in inferior right breast, highly  suspicious for primary breast carcinoma. No acute findings or metastatic disease within the abdomen or pelvis. Multiple small pulmonary nodules in both lung bases, consistent with pulmonary metastases. 4.5 cm uterine fibroid."  02/28/2019 C/A/P CT with results revealing "Irregular solid 5.0 cm right breast mass, suspicious for primary right breast malignancy. Innumerable solid pulmonary nodules scattered throughout both lungs, compatible with pulmonary metastases. No evidence of metastatic disease in the abdomen, pelvis or skeleton. Mildly enlarged and probably myomatous uterus. Simple 1.4 cm left adnexal cyst requires no follow-up. This recommendation follows ACR consensus guidelines: White Paper of the ACR Incidental Findings Committee II on Adnexal Findings. J Am Coll Radiol [phone]. Aortic Atherosclerosis (ICD10-I70.0)."  NUCLEAR MEDICINE WHOLE BODY BONE SCAN completed on 03/19/2019 with results revealing "1. No scintigraphic evidence skeletal metastasis. 2. Degenerative bone disease in the posterior elements of the upper and mid lumbar spine."   04/09/2019 Bone Density ([id number]) which revealed "The BMD measured at Femur Neck Right is 1.153 g/cm2 with a T-score of 0.8. This patient is considered normal according to [facility]) criteria. Lumbar spine was not utilized due to advanced degenerative changes. The scan quality is good. Femur Neck  Right 04/09/2019 54.7 Normal 0.8 1.153 g/cm2. Left Forearm Radius 33% 04/09/2019 54.7 Normal 0.8 0.949 g/cm2."  08/04/2019 CT Angio Chest ([id number]) revealed "1. No lobar or central pulmonary embolus detected. Exam is limited secondary to respiratory motion. 2. Mild ground-glass attenuation may represent mild pneumonitis or areas of air trapping. 3. Signs of atrial septal closure. 4. Decrease in size and number of bilateral pulmonary nodules, marked response noted on today's exam with the only nodule remaining near a cm in the right  upper lobe and the smaller nodules that were present on the previous examination throughout the chest no longer measurable though the lower lobes are limited by respiratory motion. 5. Decreased size of right breast mass. 6. Probable hepatic steatosis."  3. Abnormal Liver function test -- previous? Fatty liver 07/03/2019 [place] Abd ([id number]) revealed "1. No acute findings. Normal gallbladder. No bile duct dilation. 2. Significant increased liver parenchymal echogenicity consistent with extensive hepatic steatosis."  PLAN: -Discussed pt labwork today, 08/07/2020; liver function improved. Hgb stable. Other labs stable. Nuetrophils slightly lower due to [employer]. Slight dehydration. -Advise pt both of her last tumor markers from 07/10/2020 look stable. Will continue to monitor. -Recommended pt start taking the Probiotic.  -Advised we can start Lomotil prn in addition to the Immodium. Need to observe if this is able to control the diarrhea while on Verzenio still. -Advised pt it would be okay to take 1-2 Immodium first thing in morning as long as no constipation ensues. -Advised pt to contact if diarrhea or migraines worsen. The pt is aware and agreeable to this. -Recommended Calcium Polycarbophil if Lomotil does not work. Will re-discuss this at next visit. -Advised pt to eat prior to treatment today due to fasting since yesterday and PET scan. -Will continue Faslodex, but at 586m q4weeks now. -Continue 100 mg Verzenio BID. Patient notes she has had diarrhea issues even prior to [employer] She was encourage to discuss with PCP about possible GI referral - she did not want to be initiate GI referral at this time. -Continue Prilosec BID.  -Will see back in in 1 month with labs.   FOLLOW UP: Please schedule next 3 cycles of every 4 weekly Faslodex with labs. MD visit in 4 weeks with her next dose of Faslodex to review MMG   The total time spent in the appointment was 20 minutes and more than 50% was  on counseling and direct patient cares.   All of the patient's questions were answered with apparent satisfaction. The patient knows to call the clinic with any problems, questions or concerns.  GSullivan LoneMD MEnglewoodAAHIVMS SGuthrie Cortland Regional Medical CenterCBaptist Memorial Hospital - CalhounHematology/Oncology Physician CArrowhead Endoscopy And [facility] LLC (Office):       [phone](Work cell):  [phone](Fax):           [phone] 08/06/2020 9:53 AM  I, RReinaldo Raddle am acting as scribe for Dr. GSullivan Lone MD.   .I have reviewed the above documentation for accuracy and completeness, and I agree with the above. .Brunetta GeneraMD

## 2020-08-07 ENCOUNTER — Other Ambulatory Visit: Payer: Self-pay

## 2020-08-07 ENCOUNTER — Inpatient Hospital Stay: Payer: 59 | Attending: Hematology

## 2020-08-07 ENCOUNTER — Encounter (HOSPITAL_COMMUNITY)
Admission: RE | Admit: 2020-08-07 | Discharge: 2020-08-07 | Disposition: A | Discharge status: Home / Self Care | Payer: 59 | Source: Ambulatory Visit | Attending: Hematology | Admitting: Hematology

## 2020-08-07 ENCOUNTER — Inpatient Hospital Stay (HOSPITAL_BASED_OUTPATIENT_CLINIC_OR_DEPARTMENT_OTHER): Payer: 59 | Admitting: Hematology

## 2020-08-07 ENCOUNTER — Inpatient Hospital Stay: Payer: 59

## 2020-08-07 VITALS — BP 149/75 | HR 87 | Temp 97.3°F | Resp 18 | Ht 67.0 in

## 2020-08-07 DIAGNOSIS — R945 Abnormal results of liver function studies: Secondary | ICD-10-CM | POA: Diagnosis not present

## 2020-08-07 DIAGNOSIS — C50919 Malignant neoplasm of unspecified site of unspecified female breast: Secondary | ICD-10-CM

## 2020-08-07 DIAGNOSIS — R911 Solitary pulmonary nodule: Secondary | ICD-10-CM | POA: Diagnosis present

## 2020-08-07 DIAGNOSIS — R7989 Other specified abnormal findings of blood chemistry: Secondary | ICD-10-CM

## 2020-08-07 DIAGNOSIS — R197 Diarrhea, unspecified: Secondary | ICD-10-CM

## 2020-08-07 LAB — CMP (CANCER CENTER ONLY)
ALT: 49 U/L — ABNORMAL HIGH (ref 0–44)
AST: 38 U/L (ref 15–41)
Albumin: 3.6 g/dL (ref 3.5–5.0)
Alkaline Phosphatase: 123 U/L (ref 38–126)
Anion gap: 10 (ref 5–15)
BUN: 11 mg/dL (ref 6–20)
CO2: 26 mmol/L (ref 22–32)
Calcium: 8.9 mg/dL (ref 8.9–10.3)
Chloride: 105 mmol/L (ref 98–111)
Creatinine: 1.32 mg/dL — ABNORMAL HIGH (ref 0.44–1.00)
GFR, Estimated: 47 mL/min — ABNORMAL LOW (ref >60)
Glucose, Bld: 81 mg/dL (ref 70–99)
Potassium: 3.9 mmol/L (ref 3.5–5.1)
Sodium: 141 mmol/L (ref 135–145)
Total Bilirubin: 0.4 mg/dL (ref 0.3–1.2)
Total Protein: 7.4 g/dL (ref 6.5–8.1)

## 2020-08-07 LAB — CBC WITH DIFFERENTIAL/PLATELET
Abs Immature Granulocytes: 0 10*3/uL (ref 0.00–0.07)
Basophils Absolute: 0 10*3/uL (ref 0.0–0.1)
Basophils Relative: 1 %
Eosinophils Absolute: 0 10*3/uL (ref 0.0–0.5)
Eosinophils Relative: 1 %
HCT: 34.7 % — ABNORMAL LOW (ref 36.0–46.0)
Hemoglobin: 11.6 g/dL — ABNORMAL LOW (ref 12.0–15.0)
Immature Granulocytes: 0 %
Lymphocytes Relative: 64 %
Lymphs Abs: 2.6 10*3/uL (ref 0.7–4.0)
MCH: 32.9 pg (ref 26.0–34.0)
MCHC: 33.4 g/dL (ref 30.0–36.0)
MCV: 98.3 fL (ref 80.0–100.0)
Monocytes Absolute: 0.2 10*3/uL (ref 0.1–1.0)
Monocytes Relative: 4 %
Neutro Abs: 1.2 10*3/uL — ABNORMAL LOW (ref 1.7–7.7)
Neutrophils Relative %: 30 %
Platelets: 214 10*3/uL (ref 150–400)
RBC: 3.53 MIL/uL — ABNORMAL LOW (ref 3.87–5.11)
RDW: 13.6 % (ref 11.5–15.5)
WBC: 4 10*3/uL (ref 4.0–10.5)
nRBC: 0 % (ref 0.0–0.2)

## 2020-08-07 LAB — GLUCOSE, CAPILLARY: Glucose-Capillary: 91 mg/dL (ref 70–99)

## 2020-08-07 MED ORDER — DIPHENHYDRAMINE HCL 25 MG PO CAPS
ORAL_CAPSULE | ORAL | Status: AC
  Filled 2020-08-07: qty 1

## 2020-08-07 MED ORDER — IBUPROFEN 200 MG PO TABS
ORAL_TABLET | ORAL | Status: AC
  Filled 2020-08-07: qty 2

## 2020-08-07 MED ORDER — IBUPROFEN 200 MG PO TABS
400.0000 mg | ORAL_TABLET | Freq: Once | ORAL | Status: AC
  Administered 2020-08-07: 400 mg via ORAL

## 2020-08-07 MED ORDER — FLUDEOXYGLUCOSE F - 18 (FDG) INJECTION
11.8000 | Freq: Once | INTRAVENOUS | Status: AC | PRN
  Administered 2020-08-07: 11.3 via INTRAVENOUS

## 2020-08-07 MED ORDER — DIPHENHYDRAMINE HCL 25 MG PO CAPS
25.0000 mg | ORAL_CAPSULE | Freq: Once | ORAL | Status: AC
  Administered 2020-08-07: 25 mg via ORAL

## 2020-08-07 MED ORDER — FULVESTRANT 250 MG/5ML IM SOLN
INTRAMUSCULAR | Status: AC
  Filled 2020-08-07: qty 10

## 2020-08-07 MED ORDER — DIPHENOXYLATE-ATROPINE 2.5-0.025 MG PO TABS
1.0000 | ORAL_TABLET | Freq: Three times a day (TID) | ORAL | 0 refills | Status: DC | PRN
Start: 2020-08-07 — End: 2020-10-30

## 2020-08-07 MED ORDER — FAMOTIDINE 20 MG PO TABS
ORAL_TABLET | ORAL | Status: AC
  Filled 2020-08-07: qty 1

## 2020-08-07 MED ORDER — FULVESTRANT 250 MG/5ML IM SOLN
500.0000 mg | Freq: Once | INTRAMUSCULAR | Status: AC
  Administered 2020-08-07: 500 mg via INTRAMUSCULAR

## 2020-08-07 MED ORDER — FAMOTIDINE 20 MG PO TABS
20.0000 mg | ORAL_TABLET | Freq: Once | ORAL | Status: AC
  Administered 2020-08-07: 20 mg via ORAL

## 2020-08-07 NOTE — Progress Notes (Signed)
Verbal order per Dr. Irene Limbo - Madaline Brilliant to treat today with ANC 1.2

## 2020-08-07 NOTE — Patient Instructions (Signed)
Fulvestrant injection What is this medicine? FULVESTRANT (ful VES trant) blocks the effects of estrogen. It is used to treat breast cancer. This medicine may be used for other purposes; ask your health care provider or pharmacist if you have questions. COMMON BRAND NAME(S): FASLODEX What should I tell my health care provider before I take this medicine? They need to know if you have any of these conditions:  bleeding disorders  liver disease  low blood counts, like low white cell, platelet, or red cell counts  an unusual or allergic reaction to fulvestrant, other medicines, foods, dyes, or preservatives  pregnant or trying to get pregnant  breast-feeding How should I use this medicine? This medicine is for injection into a muscle. It is usually given by a health care professional in a hospital or clinic setting. Talk to your pediatrician regarding the use of this medicine in children. Special care may be needed. Overdosage: If you think you have taken too much of this medicine contact a poison control center or emergency room at once. NOTE: This medicine is only for you. Do not share this medicine with others. What if I miss a dose? It is important not to miss your dose. Call your doctor or health care professional if you are unable to keep an appointment. What may interact with this medicine?  medicines that treat or prevent blood clots like warfarin, enoxaparin, dalteparin, apixaban, dabigatran, and rivaroxaban This list may not describe all possible interactions. Give your health care provider a list of all the medicines, herbs, non-prescription drugs, or dietary supplements you use. Also tell them if you smoke, drink alcohol, or use illegal drugs. Some items may interact with your medicine. What should I watch for while using this medicine? Your condition will be monitored carefully while you are receiving this medicine. You will need important blood work done while you are taking  this medicine. Do not become pregnant while taking this medicine or for at least 1 year after stopping it. Women of child-bearing potential will need to have a negative pregnancy test before starting this medicine. Women should inform their doctor if they wish to become pregnant or think they might be pregnant. There is a potential for serious side effects to an unborn child. Men should inform their doctors if they wish to father a child. This medicine may lower sperm counts. Talk to your health care professional or pharmacist for more information. Do not breast-feed an infant while taking this medicine or for 1 year after the last dose. What side effects may I notice from receiving this medicine? Side effects that you should report to your doctor or health care professional as soon as possible:  allergic reactions like skin rash, itching or hives, swelling of the face, lips, or tongue  feeling faint or lightheaded, falls  pain, tingling, numbness, or weakness in the legs  signs and symptoms of infection like fever or chills; cough; flu-like symptoms; sore throat  vaginal bleeding Side effects that usually do not require medical attention (report to your doctor or health care professional if they continue or are bothersome):  aches, pains  constipation  diarrhea  headache  hot flashes  nausea, vomiting  pain at site where injected  stomach pain This list may not describe all possible side effects. Call your doctor for medical advice about side effects. You may report side effects to FDA at 1-800-FDA-1088. Where should I keep my medicine? This drug is given in a hospital or clinic and will   not be stored at home. NOTE: This sheet is a summary. It may not cover all possible information. If you have questions about this medicine, talk to your doctor, pharmacist, or health care provider.  2021 Elsevier/Gold Standard (2017-08-31 11:34:41)  

## 2020-08-12 ENCOUNTER — Telehealth: Payer: Self-pay | Admitting: Hematology

## 2020-08-12 NOTE — Telephone Encounter (Signed)
Scheduled per 03/04 los, patient has been called and notified.

## 2020-08-13 ENCOUNTER — Telehealth: Payer: Self-pay | Admitting: Hematology

## 2020-08-13 NOTE — Telephone Encounter (Signed)
Rescheduled appointments per los, patient is notified.

## 2020-08-14 ENCOUNTER — Inpatient Hospital Stay: Payer: 59

## 2020-08-17 ENCOUNTER — Other Ambulatory Visit: Payer: Self-pay | Admitting: *Deleted

## 2020-08-17 DIAGNOSIS — C50919 Malignant neoplasm of unspecified site of unspecified female breast: Secondary | ICD-10-CM

## 2020-08-17 MED ORDER — ABEMACICLIB 100 MG PO TABS: 100.0000 mg | ORAL_TABLET | Freq: Two times a day (BID) | ORAL | 0 refills | Status: DC

## 2020-08-17 NOTE — Telephone Encounter (Signed)
Refill request from [facility] for [employer]. Last ordered 08/06/2020. Contacted [employer] and verified last prescription sent to patient 08/13/20. Refill sent per Dr. Grier Mitts OV note 08/07/20.

## 2020-08-21 ENCOUNTER — Ambulatory Visit: Payer: 59

## 2020-08-21 ENCOUNTER — Other Ambulatory Visit: Payer: 59

## 2020-08-28 ENCOUNTER — Other Ambulatory Visit: Payer: 59

## 2020-08-28 ENCOUNTER — Ambulatory Visit: Payer: 59

## 2020-08-31 ENCOUNTER — Other Ambulatory Visit: Payer: Self-pay | Admitting: Hematology

## 2020-08-31 DIAGNOSIS — C50919 Malignant neoplasm of unspecified site of unspecified female breast: Secondary | ICD-10-CM

## 2020-09-02 ENCOUNTER — Other Ambulatory Visit: Payer: Self-pay | Admitting: Hematology

## 2020-09-02 ENCOUNTER — Ambulatory Visit
Admission: RE | Admit: 2020-09-02 | Discharge: 2020-09-02 | Disposition: A | Discharge status: Home / Self Care | Payer: 59 | Source: Ambulatory Visit | Attending: Hematology | Admitting: Hematology

## 2020-09-02 ENCOUNTER — Other Ambulatory Visit: Payer: Self-pay

## 2020-09-02 DIAGNOSIS — C50919 Malignant neoplasm of unspecified site of unspecified female breast: Secondary | ICD-10-CM

## 2020-09-03 NOTE — Progress Notes (Signed)
HEMATOLOGY/ONCOLOGY CLINIC NOTE  Date of Service: 09/04/2020  Patient Care Team: [facility] as PCP - General Everardo Beals, NP as Nurse Practitioner [facility]  CHIEF COMPLAINTS/PURPOSE OF CONSULTATION:  -f/u for metastatic breast cancer   HISTORY OF PRESENTING ILLNESS:  Brandy [name] is a wonderful 55 y.o. female who has been referred to [place] by [facility], [facility]* for evaluation and management of her suspected breast cancer.   The pt reports that she was having increasing low back pain on her right side that would not resolve, so she went to the ED. This pain has not resolved. She notes pain in her hips and knees.   Of note prior to the patient's visit today, pt has had an abdominal limited right upper ultrasound completed on 02/18/2019 with results revealing "Probable hepatic steatosis. No other abnormality seen in the right upper quadrant of the abdomen."  Pt had abdomen and pelvis CT completed on 02/18/2019 with results revealing "5cm spiculated soft tissue mass in inferior right breast, highly suspicious for primary breast carcinoma. No acute findings or metastatic disease within the abdomen or pelvis. Multiple small pulmonary nodules in both lung bases, consistent with pulmonary metastases. 4.5 cm uterine fibroid."  Most recent lab results (02/18/2019) of CBC is as follows: all values are WNL except for glucose at 121, calcium at 8.7, AST at 49, and ALT at 53.  On review of systems, pt reports pedal edema and denies chest pain, shortness of breath, weight loss, and any other symptoms.  Her last known mammography was on 06/18/2012.   On Social Hx the pt reports that she is a smoker and she smokes about half a pack per day. She does not drink alcohol. She has three children: age 55, 33, and 28.  On Family Hx the pt reports lung cancer. Maternal Cousin passed from stomach cancer at the age of 3.    INTERVAL HISTORY:  Brandy [name] is a 55 y.o. female here for evaluation and management of metastatic breast cancer. The patient's last visit with [place] was on 08/07/2020. The pt reports that she is doing well overall.  The pt reports that she had a mammogram and PET scan since her last visit. The pt notes that they had some information wrong as they did not know or remember she had a breast reduction and that scar tissue was the reason for biopsy. The pt notes it was scar tissue. The pt notes no issues tolerating the Faslodex and notes the premedication changes have helped her tolerate this even better.  The pt notes that the diarrhea is consistent but it is more manageable. She is continuing the plant based diet. The pt notes that her joints are starting to get more stiff and painful.   Lab results today 09/04/2020 of CBC w/diff and CMP is as follows: all values are WNL except for RBC of 3.61, Hgb of 11.5, HCT of 35.4, Neutro Abs of 1.2K, Creatinine of 1.18, Calcium of 8.7, AST of 50, ALT of 49, GFR est of 54.  On review of systems, pt reports continued intermittent diarrhea, weight loss and denies changes in bowel habits, abdominal pain, back pain, leg swelling and any other symptoms.  MEDICAL HISTORY:  Past Medical History:  Diagnosis Date  . Acute pansinusitis 08/02/2017  . Arthritis   . ASD (atrial septal defect)    s/p closure with Amplatzer device 10/05/04 (Dr. Myriam Jacobson, [facility]) 10/05/04  . Cancer ([place])   .  Cataract   . GERD (gastroesophageal reflux disease)   . Headache   . Heart murmur    no longer heard  . Legally blind in right eye, as defined in [place]   . Lumbar herniated disc   . Sciatica   Uterine Fibroids  SURGICAL HISTORY: Past Surgical History:  Procedure Laterality Date  . ABLATION    . BREAST SURGERY Bilateral 2011   Breast Reduction Surgery  . BUNIONECTOMY    . CARDIAC CATHETERIZATION     10/05/04 [facility]): LM < 25%, otherwise normal coronaries. No pulmonary HTN, Mildly enlarged RV.  Secundum ASD s/p closure.  Marland Kitchen CARDIAC SURGERY    . CATARACT EXTRACTION    . CLEFT PALATE REPAIR     s/p cleft lip and palate repair  . LUMBAR LAMINECTOMY/DECOMPRESSION MICRODISCECTOMY Left 05/23/2016   Procedure: LEFT L4-L5 LATERAL RECESS DECOMPRESSION WITH CENTRAL AND RIGHT DECOMPRESSION VIA LEFT SIDE;  Surgeon: [name], MD;  Location: [place];  Service: Orthopedics;  Laterality: Left;  . LUMBAR LAMINECTOMY/DECOMPRESSION MICRODISCECTOMY Left 05/23/2016   Procedure: LUMBAR LAMINECTOMY/DECOMPRESSION MICRODISCECTOMY Lumbar five - Sacral One 1 LEVEL;  Surgeon: [name], MD;  Location: [place];  Service: Orthopedics;  Laterality: Left;  . REDUCTION MAMMAPLASTY  2011  . SHOULDER INJECTION Left 05/23/2016   Procedure: SHOULDER INJECTION;  Surgeon: [name], MD;  Location: [place];  Service: Orthopedics;  Laterality: Left;  band-aid per pa-c  . TRANSTHORACIC ECHOCARDIOGRAM     12/15/05 [facility]): Mild LVH, EF > 62%, grade 1 diastolic dysfunction, Trivial MR/PR/TR.  Endometrial ablation 2003 Breast Reduction, bilateral 2011  SOCIAL HISTORY: Social History   Socioeconomic History  . Marital status: Married    Spouse name: Not on file  . Number of children: 3  . Years of education: Not on file  . Highest education level: Not on file  Occupational History  . Not on file  Tobacco Use  . Smoking status: Former Smoker    Packs/day: 0.25    Years: 25.00    Pack years: 6.25    Types: Cigarettes    Quit date: 02/05/2019    Years since quitting: 1.5  . Smokeless tobacco: Never Used  Vaping Use  . Vaping Use: Never used  Substance and Sexual Activity  . Alcohol use: No  . Drug use: No  . Sexual activity: Not on file  Other Topics Concern  . Not on file  Social History Narrative  . Not on file   Social Determinants of Health   Financial Resource Strain: Not on file  Food Insecurity: Not on file  Transportation Needs: Not on file  Physical Activity: Not on file  Stress: Not on  file  Social Connections: Not on file  Intimate Partner Violence: Not on file    FAMILY HISTORY: Family History  Problem Relation Age of Onset  . Diabetes Mother   . High blood pressure Mother   . Cancer Father        Lung  . Colon cancer Neg Hx   . Colon polyps Neg Hx   . Esophageal cancer Neg Hx   . Rectal cancer Neg Hx   . Stomach cancer Neg Hx     ALLERGIES:  is allergic to zithromax [azithromycin] and psyllium.  MEDICATIONS:  Current Outpatient Medications  Medication Sig Dispense Refill  . albuterol (PROVENTIL) (2.5 MG/3ML) 0.083% nebulizer solution Take 2.5 mg by nebulization every 6 (six) hours as needed for wheezing or shortness of breath.    Marland Kitchen  ALPRAZolam (XANAX) 1 MG tablet Take 1 mg by mouth at bedtime.    Marland Kitchen amitriptyline (ELAVIL) 75 MG tablet Take 75 mg by mouth at bedtime.     . benzonatate (TESSALON) 200 MG capsule Take 200 mg by mouth 3 (three) times daily as needed for cough.    . diphenoxylate-atropine (LOMOTIL) 2.5-0.025 MG tablet Take 1 tablet by mouth 3 (three) times daily as needed for diarrhea or loose stools. 30 tablet 0  . loperamide (IMODIUM) 2 MG capsule Take 2 mg by mouth as needed for diarrhea or loose stools.    . Magnesium 500 MG TABS Take 1 tablet (500 mg total) by mouth in the morning and at bedtime. 60 tablet 1  . omeprazole (PRILOSEC) 10 MG capsule Take 10 mg by mouth daily.    . ondansetron (ZOFRAN) 8 MG tablet Take 1 tablet (8 mg total) by mouth every 8 (eight) hours as needed for nausea or vomiting. 30 tablet 3  . VERZENIO 100 MG tablet TAKE 1 TABLET (100 MG) BY MOUTH TWICE A DAY. SWALLOW TABLETS WHOLE. DO NOT CHEW, CRUSH, OR SPLIT TABLETS 56 tablet 1  . Vitamin D, Ergocalciferol, (DRISDOL) 1.25 MG (50000 UNIT) CAPS capsule TAKE 1 CAPSULE BY MOUTH 1 TIME A WEEK (Patient taking differently: Take 50,000 Units by mouth every 7 (seven) days.) 12 capsule 3  . fluconazole (DIFLUCAN) 150 MG tablet Take by mouth.    Marland Kitchen HYDROcodone-acetaminophen (NORCO)  5-325 MG tablet Take 1-2 tablets by mouth every 6 (six) hours as needed for moderate pain. 30 tablet 0  . mupirocin ointment (BACTROBAN) 2 % Apply 1 application topically 2 (two) times daily. To area over rt gluteal region (Patient not taking: No sig reported) 22 g 1   Current Facility-Administered Medications  Medication Dose Route Frequency Provider Last Rate Last Admin  . 0.9 %  sodium chloride infusion  500 mL Intravenous Continuous Nandigam, Venia Minks, MD        REVIEW OF SYSTEMS:   10 Point review of Systems was done is negative except as noted above.  PHYSICAL EXAMINATION: ECOG FS:1 - Symptomatic but completely ambulatory  Vitals:   09/04/20 0927  BP: 138/69  Pulse: 92  Resp: 18  Temp: (!) 97.4 F (36.3 C)  SpO2: 99%   Wt Readings from Last 3 Encounters:  09/04/20 222 lb 8 oz (100.9 kg)  07/10/20 227 lb (103 kg)  05/07/20 226 lb 9.6 oz (102.8 kg)   Body mass index is 34.85 kg/m.    Exam was given in a chair.   GENERAL:alert, in no acute distress and comfortable SKIN: no acute rashes, no significant lesions EYES: conjunctiva are pink and non-injected, sclera anicteric OROPHARYNX: MMM, no exudates, no oropharyngeal erythema or ulceration NECK: supple, no JVD LYMPH:  no palpable lymphadenopathy in the cervical, axillary or inguinal regions LUNGS: clear to auscultation b/l with normal respiratory effort HEART: regular rate & rhythm ABDOMEN:  normoactive bowel sounds , non tender, not distended. Extremity: no pedal edema PSYCH: alert & oriented x 3 with fluent speech NEURO: no focal motor/sensory deficits  LABORATORY DATA:  I have reviewed the data as listed   CBC Latest Ref Rng & Units 09/04/2020 08/07/2020 07/10/2020  WBC 4.0 - 10.5 K/uL 5.0 4.0 5.6  Hemoglobin 12.0 - 15.0 g/dL 11.5(L) 11.6(L) 11.9(L)  Hematocrit 36.0 - 46.0 % 35.4(L) 34.7(L) 36.7  Platelets 150 - 400 K/uL 247 214 286     CMP Latest Ref Rng & Units 09/04/2020 08/07/2020 07/10/2020  Glucose 70 - 99  mg/dL 99 81 118(H)  BUN 6 - 20 mg/dL _0 Creatinine 0.44 - 1.00 mg/dL 1.18(H) 1.32(H) 1.08(H)  Sodium 135 - 145 mmol/L 143 141 141  Potassium 3.5 - 5.1 mmol/L 4.2 3.9 3.9  Chloride 98 - 111 mmol/L 104 105 102  CO2 22 - 32 mmol/L _1 Calcium 8.9 - 10.3 mg/dL 8.7(L) 8.9 9.2  Total Protein 6.5 - 8.1 g/dL 7.4 7.4 7.4  Total Bilirubin 0.3 - 1.2 mg/dL 0.5 0.4 0.5  Alkaline Phos 38 - 126 U/L 119 123 154(H)  AST 15 - 41 U/L 50(H) 38 117(H)  ALT 0 - 44 U/L 49(H) 49(H) 95(H)   Component     Latest Ref Rng & Units 03/08/2019 03/22/2019  FSH     mIU/mL 65.5   LH     mIU/mL 46.6   Estrogen     pg/mL 82   Progesterone     ng/mL <0.1   Estradiol     pg/mL  <5.0        RADIOGRAPHIC STUDIES: I have personally reviewed the radiological images as listed and agreed with the findings in the report. NM PET Image Initial (PI) Skull Base To Thigh  Result Date: 08/07/2020 CLINICAL DATA:  Initial treatment strategy for apical right lung nodule. History of breast cancer. EXAM: NUCLEAR MEDICINE PET SKULL BASE TO THIGH TECHNIQUE: 11.3 mCi F-18 FDG was injected intravenously. Full-ring PET imaging was performed from the skull base to thigh after the radiotracer. CT data was obtained and used for attenuation correction and anatomic localization. Fasting blood glucose: 91 mg/dl COMPARISON:  CT chest dated 05/21/2020.  CT chest dated 02/28/2019. FINDINGS: Mediastinal blood pool activity: SUV max 2.8 Liver activity: SUV max NA NECK: No hypermetabolic cervical lymphadenopathy. Incidental CT findings: none CHEST: 8 mm irregular nodule in the right lung apex (series 8/image 12), similar to the prior although motion degraded, non FDG avid (max SUV 1.3). When correlating with prior CT chest from 2020, this was the site of a prior dominant metastasis measuring 15 x 12 mm, and therefore is favored to reflect a treated lesion. Additional small metastases noted on that study have resolved in the interval on  recent CTs, noting motion degradation on the current study. No hypermetabolic thoracic lymphadenopathy. Incidental CT findings: Atherosclerotic calcifications of the aortic arch. Interatrial septal occluder device. ABDOMEN/PELVIS: No abnormal hypermetabolism in the liver, spleen, pancreas, or adrenal glands. No hypermetabolic abdominopelvic lymphadenopathy. Incidental CT findings: Mild atherosclerotic calcifications the abdominal aorta. SKELETON: No focal hypermetabolic activity to suggest skeletal metastasis. Incidental CT findings: Mild degenerative changes of the visualized thoracolumbar spine. IMPRESSION: 8 mm irregular nodule in the right lung apex is unchanged from recent CTs but improved from 2020, non FDG avid on the current PET. This favors a treated metastasis. Additional pulmonary metastases have resolved. No findings suspicious for new/progressive disease on PET. Electronically Signed   By: Julian Hy M.D.   On: 08/07/2020 17:07   US BREAST LTD UNI RIGHT INC AXILLA  Result Date: 09/02/2020 CLINICAL DATA:  55 year old female presenting for annual exam. Patient has a history of metastatic right breast cancer diagnosed in October 2022. She has undergone oral chemotherapy but no surgery at this time. EXAM: DIGITAL DIAGNOSTIC BILATERAL MAMMOGRAM WITH TOMOSYNTHESIS AND CAD; ULTRASOUND RIGHT BREAST LIMITED TECHNIQUE: Bilateral digital diagnostic mammography and breast tomosynthesis was performed. The images were evaluated with computer-aided detection.; Targeted ultrasound examination of the right breast was performed COMPARISON:  Previous exam(s). ACR Breast Density Category b: There are scattered areas of fibroglandular density. FINDINGS: Mammogram: Right breast: There has been interval decrease in size of the known malignancy in the outer right breast posterior depth. There is no discrete residual mass remaining though there is prominent architectural distortion, some of which may be post  treatment change. There remain suspicious microcalcifications spanning approximately 0.9 cm. In the upper inner aspect of the right breast there is architectural distortion which persists on additional spot imaging, possibly related to patient's reduction surgery, but appears more prominent compared to prior mammograms. Left breast: No suspicious mass, distortion, or microcalcifications are identified to suggest presence of malignancy. On physical exam at the upper inner aspect of the right breast I do not feel a fixed discrete mass to correspond to the distortion identified mammographically. Ultrasound: Targeted ultrasound performed in the right breast at 2 o'clock 6 cm from nipple demonstrating a hypoechoic area with ill-defined margins measuring approximately 1.2 x 0.7 x 2.8 cm. There is a central echogenic focus which likely represents a calcification identified mammographically at the center of the distortion. No internal vascularity. Targeted ultrasound of the right axilla demonstrates normal lymph nodes. IMPRESSION: 1. Interval significant decrease in size of the patient's known malignancy in the outer right breast. There remain suspicious microcalcifications spanning approximately 0.9 cm and architectural distortion, some of which may be treatment related. 2. Indeterminate architectural distortion in the upper inner quadrant of the right breast, possibly related to patient's reduction surgery, though appears more prominent compared to prior mammograms. RECOMMENDATION: Ultrasound-guided core needle biopsy of the hypoechoic area in the right breast at 2 o'clock 6 cm from nipple which corresponds to the distortion identified mammographically. I have discussed the findings and recommendations with the patient who agrees to proceed with biopsy. The patient will be scheduled for the biopsy appointment prior to leaving the office today. BI-RADS CATEGORY  4: Suspicious. Electronically Signed   By: Audie Pinto  M.D.   On: 09/02/2020 12:38   MM DIAG BREAST TOMO BILATERAL  Result Date: 09/02/2020 CLINICAL DATA:  55 year old female presenting for annual exam. Patient has a history of metastatic right breast cancer diagnosed in October 2022. She has undergone oral chemotherapy but no surgery at this time. EXAM: DIGITAL DIAGNOSTIC BILATERAL MAMMOGRAM WITH TOMOSYNTHESIS AND CAD; ULTRASOUND RIGHT BREAST LIMITED TECHNIQUE: Bilateral digital diagnostic mammography and breast tomosynthesis was performed. The images were evaluated with computer-aided detection.; Targeted ultrasound examination of the right breast was performed COMPARISON:  Previous exam(s). ACR Breast Density Category b: There are scattered areas of fibroglandular density. FINDINGS: Mammogram: Right breast: There has been interval decrease in size of the known malignancy in the outer right breast posterior depth. There is no discrete residual mass remaining though there is prominent architectural distortion, some of which may be post treatment change. There remain suspicious microcalcifications spanning approximately 0.9 cm. In the upper inner aspect of the right breast there is architectural distortion which persists on additional spot imaging, possibly related to patient's reduction surgery, but appears more prominent compared to prior mammograms. Left breast: No suspicious mass, distortion, or microcalcifications are identified to suggest presence of malignancy. On physical exam at the upper inner aspect of the right breast I do not feel a fixed discrete mass to correspond to the distortion identified mammographically. Ultrasound: Targeted ultrasound performed in the right breast at 2 o'clock 6 cm from nipple demonstrating a hypoechoic area with ill-defined margins measuring approximately 1.2 x 0.7 x 2.8 cm. There is  a central echogenic focus which likely represents a calcification identified mammographically at the center of the distortion. No internal  vascularity. Targeted ultrasound of the right axilla demonstrates normal lymph nodes. IMPRESSION: 1. Interval significant decrease in size of the patient's known malignancy in the outer right breast. There remain suspicious microcalcifications spanning approximately 0.9 cm and architectural distortion, some of which may be treatment related. 2. Indeterminate architectural distortion in the upper inner quadrant of the right breast, possibly related to patient's reduction surgery, though appears more prominent compared to prior mammograms. RECOMMENDATION: Ultrasound-guided core needle biopsy of the hypoechoic area in the right breast at 2 o'clock 6 cm from nipple which corresponds to the distortion identified mammographically. I have discussed the findings and recommendations with the patient who agrees to proceed with biopsy. The patient will be scheduled for the biopsy appointment prior to leaving the office today. BI-RADS CATEGORY  4: Suspicious. Electronically Signed   By: Audie Pinto M.D.   On: 09/02/2020 12:38   MM CLIP PLACEMENT RIGHT  Result Date: 09/02/2020 CLINICAL DATA:  Status post ultrasound-guided core biopsy of distortion in the upper inner quadrant of the right breast. EXAM: 3D DIAGNOSTIC RIGHT MAMMOGRAM POST ULTRASOUND BIOPSY COMPARISON:  Previous exam(s). FINDINGS: 3D Mammographic images were obtained following ultrasound guided biopsy of the right breast. The biopsy marking clip is in expected location in the upper-inner quadrant of the right breast. IMPRESSION: Appropriate positioning of the coil shaped biopsy marking clip at the site of biopsy in the upper-inner quadrant of the right breast. Final Assessment: Post Procedure Mammograms for Marker Placement Electronically Signed   By: Lillia Mountain M.D.   On: 09/02/2020 13:53   [place] RT BREAST BX W LOC DEV 1ST LESION IMG BX SPEC US GUIDE  Result Date: 09/02/2020 CLINICAL DATA:  Indeterminate distortion in the upper inner quadrant of the  right breast. Known cancer in the outer right breast. EXAM: ULTRASOUND GUIDED RIGHT BREAST CORE NEEDLE BIOPSY COMPARISON:  Previous exam(s). PROCEDURE: I met with the patient and we discussed the procedure of ultrasound-guided biopsy, including benefits and alternatives. We discussed the high likelihood of a successful procedure. We discussed the risks of the procedure, including infection, bleeding, tissue injury, clip migration, and inadequate sampling. Informed written consent was given. The usual time-out protocol was performed immediately prior to the procedure. Lesion quadrant: Upper inner quadrant Using sterile technique and 1% lidocaine and 1% lidocaine with epinephrine as local anesthetic, under direct ultrasound visualization, a 14 gauge spring-loaded device was used to perform biopsy of distortion in the upper inner quadrant of the right breast using an inferior to superior approach. At the conclusion of the procedure coil shaped tissue marker clip was deployed into the biopsy cavity. Follow up 2 view mammogram was performed and dictated separately. IMPRESSION: Ultrasound guided biopsy of the right breast. No apparent complications. Electronically Signed   By: Lillia Mountain M.D.   On: 09/02/2020 13:36   09/02/2020 Surgical Pathology    ASSESSMENT & PLAN:   Brandy [name] is a 55 y.o. female with:  1. Metastatic breast cancer ER+/PRneg/Her2 neg  02/28/2019 neck CT with results revealing "negative for mass or adenopathy in the neck."  03/04/2019 head MRI with results revealing "Negative for metastatic disease.  No acute abnormality in the brain."  2. Likely Pulmonary metastases  02/18/2019 chest and abdomen with results revealing "5cm spiculated soft tissue mass in inferior right breast, highly suspicious for primary breast carcinoma. No acute findings or metastatic disease within the  abdomen or pelvis. Multiple small pulmonary nodules in both lung bases, consistent with pulmonary  metastases. 4.5 cm uterine fibroid."  02/28/2019 C/A/P CT with results revealing "Irregular solid 5.0 cm right breast mass, suspicious for primary right breast malignancy. Innumerable solid pulmonary nodules scattered throughout both lungs, compatible with pulmonary metastases. No evidence of metastatic disease in the abdomen, pelvis or skeleton. Mildly enlarged and probably myomatous uterus. Simple 1.4 cm left adnexal cyst requires no follow-up. This recommendation follows ACR consensus guidelines: White Paper of the ACR Incidental Findings Committee II on Adnexal Findings. J Am Coll Radiol [phone]. Aortic Atherosclerosis (ICD10-I70.0)."  NUCLEAR MEDICINE WHOLE BODY BONE SCAN completed on 03/19/2019 with results revealing "1. No scintigraphic evidence skeletal metastasis. 2. Degenerative bone disease in the posterior elements of the upper and mid lumbar spine."   04/09/2019 Bone Density ([id number]) which revealed "The BMD measured at Femur Neck Right is 1.153 g/cm2 with a T-score of 0.8. This patient is considered normal according to [facility]) criteria. Lumbar spine was not utilized due to advanced degenerative changes. The scan quality is good. Femur Neck Right 04/09/2019 54.7 Normal 0.8 1.153 g/cm2. Left Forearm Radius 33% 04/09/2019 54.7 Normal 0.8 0.949 g/cm2."  08/04/2019 CT Angio Chest ([id number]) revealed "1. No lobar or central pulmonary embolus detected. Exam is limited secondary to respiratory motion. 2. Mild ground-glass attenuation may represent mild pneumonitis or areas of air trapping. 3. Signs of atrial septal closure. 4. Decrease in size and number of bilateral pulmonary nodules, marked response noted on today's exam with the only nodule remaining near a cm in the right upper lobe and the smaller nodules that were present on the previous examination throughout the chest no longer measurable though the lower lobes are limited by respiratory motion. 5. Decreased  size of right breast mass. 6. Probable hepatic steatosis."  3. Abnormal Liver function test -- previous? Fatty liver 07/03/2019 [place] Abd ([id number]) revealed "1. No acute findings. Normal gallbladder. No bile duct dilation. 2. Significant increased liver parenchymal echogenicity consistent with extensive hepatic steatosis."  PLAN: -Discussed pt labwork today, 09/04/2020; blood counts and chemistries stable. -Discussed pt PET 08/07/2020; no new signs and most lung spots have resolved. One 71mm nodule that is not lighting up. Not much burden of disease. -Discussed pt MMG 09/02/2020; one spot found but biopsy was negative and showed it was scar tissue. -Advised pt that due to low burden of disease and nothing lighting up, the pt should speak to surgeon regarding potential lumpectomy to reduce risk of reoccurrence. This would not be curative, but would remove primary tumor and help with local risks. The goal is to get patient to no evidence of disease status. -Advised pt there is no room for chemotherapy at this time. Not needed due to no measurable active disease currently. -Will send referral to [name]. Pt is unaware of who her last surgeon was. -Will continue Faslodex at $RemoveBef'500mg'[id number]$  q4weeks. -Continue 100 mg Verzenio BID. -Continue Vitamin D. -Continue Prilosec BID.  -Will see back in 8 weeks with labs.   FOLLOW UP: Please schedule next 3 cycles of every 4 weekly Faslodex with labs. MD visit in 8 weeks  Referral to [place] surgery for consideration of lumpectomy   The total time spent in the appointment was 30 minutes and more than 50% was on counseling and direct patient cares.    All of the patient's questions were answered with apparent satisfaction. The patient knows to call the clinic with any problems, questions  or concerns.  Sullivan Lone MD [place] AAHIVMS [facility] Hematology/Oncology Physician [facility]  (Office):       [phone] (Work cell):   [phone] (Fax):           [phone]  09/04/2020 10:09 AM  I, Reinaldo Raddle, am acting as scribe for Dr. Sullivan Lone, MD.   .I have reviewed the above documentation for accuracy and completeness, and I agree with the above. Brunetta Genera MD

## 2020-09-04 ENCOUNTER — Other Ambulatory Visit: Payer: Self-pay

## 2020-09-04 ENCOUNTER — Ambulatory Visit: Payer: 59

## 2020-09-04 ENCOUNTER — Ambulatory Visit: Payer: 59 | Admitting: Hematology

## 2020-09-04 ENCOUNTER — Inpatient Hospital Stay (HOSPITAL_BASED_OUTPATIENT_CLINIC_OR_DEPARTMENT_OTHER): Payer: 59 | Admitting: Hematology

## 2020-09-04 ENCOUNTER — Other Ambulatory Visit (HOSPITAL_COMMUNITY): Payer: Self-pay

## 2020-09-04 ENCOUNTER — Other Ambulatory Visit: Payer: 59

## 2020-09-04 ENCOUNTER — Inpatient Hospital Stay: Payer: 59

## 2020-09-04 ENCOUNTER — Inpatient Hospital Stay: Payer: 59 | Attending: Hematology

## 2020-09-04 VITALS — BP 138/69 | HR 92 | Temp 97.4°F | Resp 18 | Ht 67.0 in | Wt 222.5 lb

## 2020-09-04 DIAGNOSIS — Z5111 Encounter for antineoplastic chemotherapy: Secondary | ICD-10-CM | POA: Diagnosis not present

## 2020-09-04 DIAGNOSIS — Z801 Family history of malignant neoplasm of trachea, bronchus and lung: Secondary | ICD-10-CM | POA: Diagnosis not present

## 2020-09-04 DIAGNOSIS — K760 Fatty (change of) liver, not elsewhere classified: Secondary | ICD-10-CM | POA: Insufficient documentation

## 2020-09-04 DIAGNOSIS — R7989 Other specified abnormal findings of blood chemistry: Secondary | ICD-10-CM | POA: Insufficient documentation

## 2020-09-04 DIAGNOSIS — Z170 Estrogen receptor positive status [ER+]: Secondary | ICD-10-CM | POA: Diagnosis not present

## 2020-09-04 DIAGNOSIS — R945 Abnormal results of liver function studies: Secondary | ICD-10-CM | POA: Diagnosis not present

## 2020-09-04 DIAGNOSIS — C50919 Malignant neoplasm of unspecified site of unspecified female breast: Secondary | ICD-10-CM

## 2020-09-04 DIAGNOSIS — C7802 Secondary malignant neoplasm of left lung: Secondary | ICD-10-CM | POA: Diagnosis not present

## 2020-09-04 DIAGNOSIS — R197 Diarrhea, unspecified: Secondary | ICD-10-CM | POA: Diagnosis not present

## 2020-09-04 DIAGNOSIS — Z87891 Personal history of nicotine dependence: Secondary | ICD-10-CM | POA: Diagnosis not present

## 2020-09-04 DIAGNOSIS — Z833 Family history of diabetes mellitus: Secondary | ICD-10-CM | POA: Diagnosis not present

## 2020-09-04 DIAGNOSIS — Z79899 Other long term (current) drug therapy: Secondary | ICD-10-CM | POA: Diagnosis not present

## 2020-09-04 DIAGNOSIS — Z8249 Family history of ischemic heart disease and other diseases of the circulatory system: Secondary | ICD-10-CM | POA: Insufficient documentation

## 2020-09-04 DIAGNOSIS — C7801 Secondary malignant neoplasm of right lung: Secondary | ICD-10-CM | POA: Diagnosis not present

## 2020-09-04 DIAGNOSIS — C50811 Malignant neoplasm of overlapping sites of right female breast: Secondary | ICD-10-CM | POA: Diagnosis present

## 2020-09-04 DIAGNOSIS — C50911 Malignant neoplasm of unspecified site of right female breast: Secondary | ICD-10-CM

## 2020-09-04 DIAGNOSIS — I700 Atherosclerosis of aorta: Secondary | ICD-10-CM | POA: Insufficient documentation

## 2020-09-04 LAB — CBC WITH DIFFERENTIAL/PLATELET
Abs Immature Granulocytes: 0.01 10*3/uL (ref 0.00–0.07)
Basophils Absolute: 0 10*3/uL (ref 0.0–0.1)
Basophils Relative: 1 %
Eosinophils Absolute: 0.1 10*3/uL (ref 0.0–0.5)
Eosinophils Relative: 1 %
HCT: 35.4 % — ABNORMAL LOW (ref 36.0–46.0)
Hemoglobin: 11.5 g/dL — ABNORMAL LOW (ref 12.0–15.0)
Immature Granulocytes: 0 %
Lymphocytes Relative: 67 %
Lymphs Abs: 3.4 10*3/uL (ref 0.7–4.0)
MCH: 31.9 pg (ref 26.0–34.0)
MCHC: 32.5 g/dL (ref 30.0–36.0)
MCV: 98.1 fL (ref 80.0–100.0)
Monocytes Absolute: 0.3 10*3/uL (ref 0.1–1.0)
Monocytes Relative: 6 %
Neutro Abs: 1.2 10*3/uL — ABNORMAL LOW (ref 1.7–7.7)
Neutrophils Relative %: 25 %
Platelets: 247 10*3/uL (ref 150–400)
RBC: 3.61 MIL/uL — ABNORMAL LOW (ref 3.87–5.11)
RDW: 15.2 % (ref 11.5–15.5)
WBC: 5 10*3/uL (ref 4.0–10.5)
nRBC: 0 % (ref 0.0–0.2)

## 2020-09-04 LAB — CMP (CANCER CENTER ONLY)
ALT: 49 U/L — ABNORMAL HIGH (ref 0–44)
AST: 50 U/L — ABNORMAL HIGH (ref 15–41)
Albumin: 3.6 g/dL (ref 3.5–5.0)
Alkaline Phosphatase: 119 U/L (ref 38–126)
Anion gap: 10 (ref 5–15)
BUN: 11 mg/dL (ref 6–20)
CO2: 29 mmol/L (ref 22–32)
Calcium: 8.7 mg/dL — ABNORMAL LOW (ref 8.9–10.3)
Chloride: 104 mmol/L (ref 98–111)
Creatinine: 1.18 mg/dL — ABNORMAL HIGH (ref 0.44–1.00)
GFR, Estimated: 54 mL/min — ABNORMAL LOW (ref >60)
Glucose, Bld: 99 mg/dL (ref 70–99)
Potassium: 4.2 mmol/L (ref 3.5–5.1)
Sodium: 143 mmol/L (ref 135–145)
Total Bilirubin: 0.5 mg/dL (ref 0.3–1.2)
Total Protein: 7.4 g/dL (ref 6.5–8.1)

## 2020-09-04 MED ORDER — FULVESTRANT 250 MG/5ML IM SOLN
500.0000 mg | Freq: Once | INTRAMUSCULAR | Status: AC
  Administered 2020-09-04: 500 mg via INTRAMUSCULAR

## 2020-09-04 MED ORDER — FULVESTRANT 250 MG/5ML IM SOLN
INTRAMUSCULAR | Status: AC
  Filled 2020-09-04: qty 10

## 2020-09-07 ENCOUNTER — Telehealth: Payer: Self-pay | Admitting: Hematology

## 2020-09-07 NOTE — Telephone Encounter (Signed)
Added provider appointment per 4/1 los. Patient is aware of changes.

## 2020-10-02 ENCOUNTER — Other Ambulatory Visit: Payer: Self-pay

## 2020-10-02 ENCOUNTER — Inpatient Hospital Stay: Payer: 59

## 2020-10-02 VITALS — BP 130/72 | HR 99 | Resp 18

## 2020-10-02 DIAGNOSIS — C50919 Malignant neoplasm of unspecified site of unspecified female breast: Secondary | ICD-10-CM

## 2020-10-02 DIAGNOSIS — Z5111 Encounter for antineoplastic chemotherapy: Secondary | ICD-10-CM | POA: Diagnosis not present

## 2020-10-02 LAB — CBC WITH DIFFERENTIAL/PLATELET
Abs Immature Granulocytes: 0.01 10*3/uL (ref 0.00–0.07)
Basophils Absolute: 0 10*3/uL (ref 0.0–0.1)
Basophils Relative: 1 %
Eosinophils Absolute: 0.1 10*3/uL (ref 0.0–0.5)
Eosinophils Relative: 1 %
HCT: 34.8 % — ABNORMAL LOW (ref 36.0–46.0)
Hemoglobin: 11.5 g/dL — ABNORMAL LOW (ref 12.0–15.0)
Immature Granulocytes: 0 %
Lymphocytes Relative: 67 %
Lymphs Abs: 2.7 10*3/uL (ref 0.7–4.0)
MCH: 32.7 pg (ref 26.0–34.0)
MCHC: 33 g/dL (ref 30.0–36.0)
MCV: 98.9 fL (ref 80.0–100.0)
Monocytes Absolute: 0.2 10*3/uL (ref 0.1–1.0)
Monocytes Relative: 5 %
Neutro Abs: 1 10*3/uL — ABNORMAL LOW (ref 1.7–7.7)
Neutrophils Relative %: 26 %
Platelets: 278 10*3/uL (ref 150–400)
RBC: 3.52 MIL/uL — ABNORMAL LOW (ref 3.87–5.11)
RDW: 16.6 % — ABNORMAL HIGH (ref 11.5–15.5)
WBC: 4 10*3/uL (ref 4.0–10.5)
nRBC: 0 % (ref 0.0–0.2)

## 2020-10-02 LAB — CMP (CANCER CENTER ONLY)
ALT: 185 U/L — ABNORMAL HIGH (ref 0–44)
AST: 137 U/L — ABNORMAL HIGH (ref 15–41)
Albumin: 3.6 g/dL (ref 3.5–5.0)
Alkaline Phosphatase: 163 U/L — ABNORMAL HIGH (ref 38–126)
Anion gap: 10 (ref 5–15)
BUN: 14 mg/dL (ref 6–20)
CO2: 28 mmol/L (ref 22–32)
Calcium: 9.6 mg/dL (ref 8.9–10.3)
Chloride: 103 mmol/L (ref 98–111)
Creatinine: 1.21 mg/dL — ABNORMAL HIGH (ref 0.44–1.00)
GFR, Estimated: 53 mL/min — ABNORMAL LOW (ref >60)
Glucose, Bld: 95 mg/dL (ref 70–99)
Potassium: 4.1 mmol/L (ref 3.5–5.1)
Sodium: 141 mmol/L (ref 135–145)
Total Bilirubin: 0.4 mg/dL (ref 0.3–1.2)
Total Protein: 7.9 g/dL (ref 6.5–8.1)

## 2020-10-02 MED ORDER — FULVESTRANT 250 MG/5ML IM SOLN
INTRAMUSCULAR | Status: AC
  Filled 2020-10-02: qty 10

## 2020-10-02 MED ORDER — FULVESTRANT 250 MG/5ML IM SOLN
500.0000 mg | Freq: Once | INTRAMUSCULAR | Status: AC
  Administered 2020-10-02: 500 mg via INTRAMUSCULAR

## 2020-10-02 NOTE — Patient Instructions (Signed)
Fulvestrant injection What is this medicine? FULVESTRANT (ful VES trant) blocks the effects of estrogen. It is used to treat breast cancer. This medicine may be used for other purposes; ask your health care provider or pharmacist if you have questions. COMMON BRAND NAME(S): FASLODEX What should I tell my health care provider before I take this medicine? They need to know if you have any of these conditions:  bleeding disorders  liver disease  low blood counts, like low white cell, platelet, or red cell counts  an unusual or allergic reaction to fulvestrant, other medicines, foods, dyes, or preservatives  pregnant or trying to get pregnant  breast-feeding How should I use this medicine? This medicine is for injection into a muscle. It is usually given by a health care professional in a hospital or clinic setting. Talk to your pediatrician regarding the use of this medicine in children. Special care may be needed. Overdosage: If you think you have taken too much of this medicine contact a poison control center or emergency room at once. NOTE: This medicine is only for you. Do not share this medicine with others. What if I miss a dose? It is important not to miss your dose. Call your doctor or health care professional if you are unable to keep an appointment. What may interact with this medicine?  medicines that treat or prevent blood clots like warfarin, enoxaparin, dalteparin, apixaban, dabigatran, and rivaroxaban This list may not describe all possible interactions. Give your health care provider a list of all the medicines, herbs, non-prescription drugs, or dietary supplements you use. Also tell them if you smoke, drink alcohol, or use illegal drugs. Some items may interact with your medicine. What should I watch for while using this medicine? Your condition will be monitored carefully while you are receiving this medicine. You will need important blood work done while you are taking  this medicine. Do not become pregnant while taking this medicine or for at least 1 year after stopping it. Women of child-bearing potential will need to have a negative pregnancy test before starting this medicine. Women should inform their doctor if they wish to become pregnant or think they might be pregnant. There is a potential for serious side effects to an unborn child. Men should inform their doctors if they wish to father a child. This medicine may lower sperm counts. Talk to your health care professional or pharmacist for more information. Do not breast-feed an infant while taking this medicine or for 1 year after the last dose. What side effects may I notice from receiving this medicine? Side effects that you should report to your doctor or health care professional as soon as possible:  allergic reactions like skin rash, itching or hives, swelling of the face, lips, or tongue  feeling faint or lightheaded, falls  pain, tingling, numbness, or weakness in the legs  signs and symptoms of infection like fever or chills; cough; flu-like symptoms; sore throat  vaginal bleeding Side effects that usually do not require medical attention (report to your doctor or health care professional if they continue or are bothersome):  aches, pains  constipation  diarrhea  headache  hot flashes  nausea, vomiting  pain at site where injected  stomach pain This list may not describe all possible side effects. Call your doctor for medical advice about side effects. You may report side effects to FDA at 1-800-FDA-1088. Where should I keep my medicine? This drug is given in a hospital or clinic and will   not be stored at home. NOTE: This sheet is a summary. It may not cover all possible information. If you have questions about this medicine, talk to your doctor, pharmacist, or health care provider.  2021 Elsevier/Gold Standard (2017-08-31 11:34:41)  

## 2020-10-27 ENCOUNTER — Other Ambulatory Visit: Payer: Self-pay | Admitting: General Surgery

## 2020-10-27 DIAGNOSIS — C50911 Malignant neoplasm of unspecified site of right female breast: Secondary | ICD-10-CM

## 2020-10-28 ENCOUNTER — Other Ambulatory Visit: Payer: Self-pay | Admitting: General Surgery

## 2020-10-28 DIAGNOSIS — C50911 Malignant neoplasm of unspecified site of right female breast: Secondary | ICD-10-CM

## 2020-10-29 NOTE — Progress Notes (Signed)
HEMATOLOGY/ONCOLOGY CLINIC NOTE  Date of Service: 10/30/2020  Patient Care Team: [facility] as PCP - General Everardo Beals, NP as Nurse Practitioner Burr Medico  CHIEF COMPLAINTS/PURPOSE OF CONSULTATION:  -f/u for metastatic breast cancer   HISTORY OF PRESENTING ILLNESS:  Brandy [name] is a wonderful 55 y.o. female who has been referred to [place] by [facility] for evaluation and management of her suspected breast cancer.   The pt reports that she was having increasing low back pain on her right side that would not resolve, so she went to the ED. This pain has not resolved. She notes pain in her hips and knees.   Of note prior to the patient's visit today, pt has had an abdominal limited right upper ultrasound completed on 02/18/2019 with results revealing "Probable hepatic steatosis. No other abnormality seen in the right upper quadrant of the abdomen."  Pt had abdomen and pelvis CT completed on 02/18/2019 with results revealing "5cm spiculated soft tissue mass in inferior right breast, highly suspicious for primary breast carcinoma. No acute findings or metastatic disease within the abdomen or pelvis. Multiple small pulmonary nodules in both lung bases, consistent with pulmonary metastases. 4.5 cm uterine fibroid."  Most recent lab results (02/18/2019) of CBC is as follows: all values are WNL except for glucose at 121, calcium at 8.7, AST at 49, and ALT at 53.  On review of systems, pt reports pedal edema and denies chest pain, shortness of breath, weight loss, and any other symptoms.  Her last known mammography was on 06/18/2012.   On Social Hx the pt reports that she is a smoker and she smokes about half a pack per day. She does not drink alcohol. She has three children: age 62, 39, and 61.  On Family Hx the pt reports lung cancer. Maternal Cousin passed from stomach cancer at the age of 76.    INTERVAL HISTORY:  Brandy [name] is a 55 y.o. female here for evaluation and management of metastatic breast cancer. The patient's last visit with [place] was on 09/04/2020. The pt reports that she is doing well overall.  The pt reports that she had her mammogram and breast biopsy that only showed scar tissue. She is scheduled for a lumpectomy in June 20. The spot is down to 1 cm. She notes no new symptoms or concerns outside of her plant based diet. She continues to have much diarrhea. The Lomotil is helpful and much better than the imodium. The pt notes she went to a show in [place] last weekend and was getting increasingly SOB. She used her inhaler for this ad notes no persistent issues. These inhalers are expired she notes. She notes no issues with her Faslodex shots. The pt continues to tolerate this well. No medication changes were noted. Her BP and HR have been improving and decreasing a lot.  Lab results today 10/30/2020 of CBC w/diff and CMP is as follows: all values are WNL except for RBC of 3.22, Hgb of 10.9, Hct of 32.4, MCV of 100.6, RDW of 15.8, Neutro Abs of 1.4K, Creatinine of 1.30, Albumin of 3.4, AST of 64, ALT of 56, Alkaline Phosphatase of 127, GFR est of 48.  On review of systems, pt reports diarrhea and denies acute changes in breathing, infection issues, breast pain/discomfort, back pain, and any other symptoms.  MEDICAL HISTORY:  Past Medical History:  Diagnosis Date  . Acute pansinusitis 08/02/2017  . Arthritis   .  ASD (atrial septal defect)    s/p closure with Amplatzer device 10/05/04 (Dr. Myriam Jacobson, [facility]) 10/05/04  . Cancer ([place])   . Cataract   . GERD (gastroesophageal reflux disease)   . Headache   . Heart murmur    no longer heard  . Legally blind in right eye, as defined in [place]   . Lumbar herniated disc   . Sciatica   Uterine Fibroids  SURGICAL HISTORY: Past Surgical History:  Procedure Laterality Date  . ABLATION    . BREAST SURGERY Bilateral 2011   Breast Reduction  Surgery  . BUNIONECTOMY    . CARDIAC CATHETERIZATION     10/05/04 [facility]): LM < 25%, otherwise normal coronaries. No pulmonary HTN, Mildly enlarged RV. Secundum ASD s/p closure.  Marland Kitchen CARDIAC SURGERY    . CATARACT EXTRACTION    . CLEFT PALATE REPAIR     s/p cleft lip and palate repair  . LUMBAR LAMINECTOMY/DECOMPRESSION MICRODISCECTOMY Left 05/23/2016   Procedure: LEFT L4-L5 LATERAL RECESS DECOMPRESSION WITH CENTRAL AND RIGHT DECOMPRESSION VIA LEFT SIDE;  Surgeon: [name], MD;  Location: [place];  Service: Orthopedics;  Laterality: Left;  . LUMBAR LAMINECTOMY/DECOMPRESSION MICRODISCECTOMY Left 05/23/2016   Procedure: LUMBAR LAMINECTOMY/DECOMPRESSION MICRODISCECTOMY Lumbar five - Sacral One 1 LEVEL;  Surgeon: [name], MD;  Location: [place];  Service: Orthopedics;  Laterality: Left;  . REDUCTION MAMMAPLASTY  2011  . SHOULDER INJECTION Left 05/23/2016   Procedure: SHOULDER INJECTION;  Surgeon: [name], MD;  Location: [place];  Service: Orthopedics;  Laterality: Left;  band-aid per pa-c  . TRANSTHORACIC ECHOCARDIOGRAM     12/15/05 [facility]): Mild LVH, EF > 95%, grade 1 diastolic dysfunction, Trivial MR/PR/TR.  Endometrial ablation 2003 Breast Reduction, bilateral 2011  SOCIAL HISTORY: Social History   Socioeconomic History  . Marital status: Married    Spouse name: Not on file  . Number of children: 3  . Years of education: Not on file  . Highest education level: Not on file  Occupational History  . Not on file  Tobacco Use  . Smoking status: Former Smoker    Packs/day: 0.25    Years: 25.00    Pack years: 6.25    Types: Cigarettes    Quit date: 02/05/2019    Years since quitting: 1.7  . Smokeless tobacco: Never Used  Vaping Use  . Vaping Use: Never used  Substance and Sexual Activity  . Alcohol use: No  . Drug use: No  . Sexual activity: Not on file  Other Topics Concern  . Not on file  Social History Narrative  . Not on file   Social Determinants of Health    Financial Resource Strain: Not on file  Food Insecurity: Not on file  Transportation Needs: Not on file  Physical Activity: Not on file  Stress: Not on file  Social Connections: Not on file  Intimate Partner Violence: Not on file    FAMILY HISTORY: Family History  Problem Relation Age of Onset  . Diabetes Mother   . High blood pressure Mother   . Cancer Father        Lung  . Colon cancer Neg Hx   . Colon polyps Neg Hx   . Esophageal cancer Neg Hx   . Rectal cancer Neg Hx   . Stomach cancer Neg Hx     ALLERGIES:  is allergic to zithromax [azithromycin] and psyllium.  MEDICATIONS:  Current Outpatient Medications  Medication Sig Dispense Refill  . albuterol (PROVENTIL) (  2.5 MG/3ML) 0.083% nebulizer solution Take 3 mLs (2.5 mg total) by nebulization every 6 (six) hours as needed for wheezing or shortness of breath. 75 mL 0  . ALPRAZolam (XANAX) 1 MG tablet Take 1 mg by mouth at bedtime.    Marland Kitchen amitriptyline (ELAVIL) 75 MG tablet Take 75 mg by mouth at bedtime.     . benzonatate (TESSALON) 200 MG capsule Take 200 mg by mouth 3 (three) times daily as needed for cough.    . diphenoxylate-atropine (LOMOTIL) 2.5-0.025 MG tablet Take 1 tablet by mouth 3 (three) times daily as needed for diarrhea or loose stools. 30 tablet 0  . fluconazole (DIFLUCAN) 150 MG tablet Take by mouth.    Marland Kitchen HYDROcodone-acetaminophen (NORCO) 5-325 MG tablet Take 1-2 tablets by mouth every 6 (six) hours as needed for moderate pain. 30 tablet 0  . loperamide (IMODIUM) 2 MG capsule Take 2 mg by mouth as needed for diarrhea or loose stools.    . Magnesium 500 MG TABS Take 1 tablet (500 mg total) by mouth in the morning and at bedtime. 60 tablet 1  . mupirocin ointment (BACTROBAN) 2 % Apply 1 application topically 2 (two) times daily. To area over rt gluteal region (Patient not taking: No sig reported) 22 g 1  . omeprazole (PRILOSEC) 10 MG capsule Take 10 mg by mouth daily.    . ondansetron (ZOFRAN) 8 MG tablet Take  1 tablet (8 mg total) by mouth every 8 (eight) hours as needed for nausea or vomiting. 30 tablet 3  . VERZENIO 100 MG tablet TAKE 1 TABLET (100 MG) BY MOUTH TWICE A DAY. SWALLOW TABLETS WHOLE. DO NOT CHEW, CRUSH, OR SPLIT TABLETS 56 tablet 1  . Vitamin D, Ergocalciferol, (DRISDOL) 1.25 MG (50000 UNIT) CAPS capsule TAKE 1 CAPSULE BY MOUTH 1 TIME A WEEK (Patient taking differently: Take 50,000 Units by mouth every 7 (seven) days.) 12 capsule 3   Current Facility-Administered Medications  Medication Dose Route Frequency Provider Last Rate Last Admin  . 0.9 %  sodium chloride infusion  500 mL Intravenous Continuous Nandigam, Venia Minks, MD        REVIEW OF SYSTEMS:   10 Point review of Systems was done is negative except as noted above.  PHYSICAL EXAMINATION: ECOG FS:1 - Symptomatic but completely ambulatory  Vitals:   10/30/20 1254  BP: 128/75  Pulse: 84  Resp: 18  Temp: 98.2 F (36.8 C)  SpO2: 100%   Wt Readings from Last 3 Encounters:  10/30/20 219 lb 8 oz (99.6 kg)  09/04/20 222 lb 8 oz (100.9 kg)  07/10/20 227 lb (103 kg)   Body mass index is 34.38 kg/m.     GENERAL:alert, in no acute distress and comfortable SKIN: no acute rashes, no significant lesions EYES: conjunctiva are pink and non-injected, sclera anicteric OROPHARYNX: MMM, no exudates, no oropharyngeal erythema or ulceration NECK: supple, no JVD LYMPH:  no palpable lymphadenopathy in the cervical, axillary or inguinal regions LUNGS: clear to auscultation b/l with normal respiratory effort HEART: regular rate & rhythm ABDOMEN:  normoactive bowel sounds , non tender, not distended. Extremity: no pedal edema PSYCH: alert & oriented x 3 with fluent speech NEURO: no focal motor/sensory deficits  LABORATORY DATA:  I have reviewed the data as listed   CBC Latest Ref Rng & Units 10/30/2020 10/02/2020 09/04/2020  WBC 4.0 - 10.5 K/uL 4.3 4.0 5.0  Hemoglobin 12.0 - 15.0 g/dL 10.9(L) 11.5(L) 11.5(L)  Hematocrit 36.0 -  46.0 % 32.4(L) 34.8(L) 35.4(L)  Platelets 150 - 400 K/uL 250 278 247     CMP Latest Ref Rng & Units 10/30/2020 10/02/2020 09/04/2020  Glucose 70 - 99 mg/dL 98 95 99  BUN 6 - 20 mg/dL _0 Creatinine 0.44 - 1.00 mg/dL 1.30(H) 1.21(H) 1.18(H)  Sodium 135 - 145 mmol/L 142 141 143  Potassium 3.5 - 5.1 mmol/L 4.0 4.1 4.2  Chloride 98 - 111 mmol/L 105 103 104  CO2 22 - 32 mmol/L _1 Calcium 8.9 - 10.3 mg/dL 9.1 9.6 8.7(L)  Total Protein 6.5 - 8.1 g/dL 7.1 7.9 7.4  Total Bilirubin 0.3 - 1.2 mg/dL 0.5 0.4 0.5  Alkaline Phos 38 - 126 U/L 127(H) 163(H) 119  AST 15 - 41 U/L 64(H) 137(H) 50(H)  ALT 0 - 44 U/L 56(H) 185(H) 49(H)   Component     Latest Ref Rng & Units 03/08/2019 03/22/2019  FSH     mIU/mL 65.5   LH     mIU/mL 46.6   Estrogen     pg/mL 82   Progesterone     ng/mL <0.1   Estradiol     pg/mL  <5.0        RADIOGRAPHIC STUDIES: I have personally reviewed the radiological images as listed and agreed with the findings in the report. No results found. 09/02/2020 Surgical Pathology    ASSESSMENT & PLAN:   Brandy [name] is a 55 y.o. female with:  1. Metastatic breast cancer ER+/PRneg/Her2 neg  02/28/2019 neck CT with results revealing "negative for mass or adenopathy in the neck."  03/04/2019 head MRI with results revealing "Negative for metastatic disease.  No acute abnormality in the brain."  2. Likely Pulmonary metastases  02/18/2019 chest and abdomen with results revealing "5cm spiculated soft tissue mass in inferior right breast, highly suspicious for primary breast carcinoma. No acute findings or metastatic disease within the abdomen or pelvis. Multiple small pulmonary nodules in both lung bases, consistent with pulmonary metastases. 4.5 cm uterine fibroid."  02/28/2019 C/A/P CT with results revealing "Irregular solid 5.0 cm right breast mass, suspicious for primary right breast malignancy. Innumerable solid pulmonary nodules scattered throughout  both lungs, compatible with pulmonary metastases. No evidence of metastatic disease in the abdomen, pelvis or skeleton. Mildly enlarged and probably myomatous uterus. Simple 1.4 cm left adnexal cyst requires no follow-up. This recommendation follows ACR consensus guidelines: White Paper of the ACR Incidental Findings Committee II on Adnexal Findings. J Am Coll Radiol [phone]. Aortic Atherosclerosis (ICD10-I70.0)."  NUCLEAR MEDICINE WHOLE BODY BONE SCAN completed on 03/19/2019 with results revealing "1. No scintigraphic evidence skeletal metastasis. 2. Degenerative bone disease in the posterior elements of the upper and mid lumbar spine."   04/09/2019 Bone Density ([id number]) which revealed "The BMD measured at Femur Neck Right is 1.153 g/cm2 with a T-score of 0.8. This patient is considered normal according to [facility]) criteria. Lumbar spine was not utilized due to advanced degenerative changes. The scan quality is good. Femur Neck Right 04/09/2019 54.7 Normal 0.8 1.153 g/cm2. Left Forearm Radius 33% 04/09/2019 54.7 Normal 0.8 0.949 g/cm2."  08/04/2019 CT Angio Chest ([id number]) revealed "1. No lobar or central pulmonary embolus detected. Exam is limited secondary to respiratory motion. 2. Mild ground-glass attenuation may represent mild pneumonitis or areas of air trapping. 3. Signs of atrial septal closure. 4. Decrease in size and number of bilateral pulmonary nodules, marked response noted on today's exam with the only nodule remaining near a cm in the right  upper lobe and the smaller nodules that were present on the previous examination throughout the chest no longer measurable though the lower lobes are limited by respiratory motion. 5. Decreased size of right breast mass. 6. Probable hepatic steatosis."  3. Abnormal Liver function test -- previous? Fatty liver 07/03/2019 [place] Abd ([id number]) revealed "1. No acute findings. Normal gallbladder. No bile duct dilation. 2.  Significant increased liver parenchymal echogenicity consistent with extensive hepatic steatosis."  PLAN: -Discussed pt labwork today, 10/30/2020; liver enzymes stable, counts stable, other chemistries stable. -Recommended pt continue to stay active and walk around 20-30 min daily. -Recommended pt de-stress and eat healthy. Continue with a healthy weight loss regiment. -Will continue Faslodex at 53m q4weeks. -Continue 100 mg Verzenio BID. -Continue Vitamin D. -Continue Prilosec BID.  -patient has scheduled f/u for her lumpectomy with surgery -Refill Lomotil and nebulizer solution. -Will see back in 2 months with labs.   FOLLOW UP: Please schedule next 3 cycles of every 4 weekly Faslodex with labs. MD visit in 8 weeks    The total time spent in the appointment was 20 minutes and more than 50% was on counseling and direct patient cares.    All of the patient's questions were answered with apparent satisfaction. The patient knows to call the clinic with any problems, questions or concerns.  GSullivan LoneMD MCordovaAAHIVMS [facility] CenterCM[facility] Centers For HealthcareHematology/Oncology Physician [facility] (Office):       [phone](Work cell):  [phone](Fax):           [phone] 10/30/2020 2:18 PM  I, RReinaldo Raddle am acting as scribe for Dr. GSullivan Lone MD.   .I have reviewed the above documentation for accuracy and completeness, and I agree with the above..Marland Kitchen.Marland KitchenBrunetta GeneraMD

## 2020-10-30 ENCOUNTER — Ambulatory Visit: Payer: 59

## 2020-10-30 ENCOUNTER — Other Ambulatory Visit: Payer: Self-pay

## 2020-10-30 ENCOUNTER — Inpatient Hospital Stay: Payer: 59 | Attending: Hematology

## 2020-10-30 ENCOUNTER — Inpatient Hospital Stay: Payer: 59

## 2020-10-30 ENCOUNTER — Other Ambulatory Visit: Payer: 59

## 2020-10-30 ENCOUNTER — Inpatient Hospital Stay (HOSPITAL_BASED_OUTPATIENT_CLINIC_OR_DEPARTMENT_OTHER): Payer: 59 | Admitting: Hematology

## 2020-10-30 VITALS — BP 128/75 | HR 84 | Temp 98.2°F | Resp 18 | Ht 67.0 in | Wt 219.5 lb

## 2020-10-30 DIAGNOSIS — Z8249 Family history of ischemic heart disease and other diseases of the circulatory system: Secondary | ICD-10-CM | POA: Insufficient documentation

## 2020-10-30 DIAGNOSIS — Z87891 Personal history of nicotine dependence: Secondary | ICD-10-CM | POA: Diagnosis not present

## 2020-10-30 DIAGNOSIS — Z5111 Encounter for antineoplastic chemotherapy: Secondary | ICD-10-CM | POA: Insufficient documentation

## 2020-10-30 DIAGNOSIS — Z79899 Other long term (current) drug therapy: Secondary | ICD-10-CM | POA: Insufficient documentation

## 2020-10-30 DIAGNOSIS — Z833 Family history of diabetes mellitus: Secondary | ICD-10-CM | POA: Diagnosis not present

## 2020-10-30 DIAGNOSIS — C50919 Malignant neoplasm of unspecified site of unspecified female breast: Secondary | ICD-10-CM

## 2020-10-30 DIAGNOSIS — C50811 Malignant neoplasm of overlapping sites of right female breast: Secondary | ICD-10-CM | POA: Diagnosis present

## 2020-10-30 DIAGNOSIS — I700 Atherosclerosis of aorta: Secondary | ICD-10-CM | POA: Insufficient documentation

## 2020-10-30 DIAGNOSIS — R7989 Other specified abnormal findings of blood chemistry: Secondary | ICD-10-CM | POA: Diagnosis not present

## 2020-10-30 DIAGNOSIS — C7801 Secondary malignant neoplasm of right lung: Secondary | ICD-10-CM | POA: Diagnosis not present

## 2020-10-30 DIAGNOSIS — Z170 Estrogen receptor positive status [ER+]: Secondary | ICD-10-CM | POA: Insufficient documentation

## 2020-10-30 DIAGNOSIS — C7802 Secondary malignant neoplasm of left lung: Secondary | ICD-10-CM | POA: Diagnosis not present

## 2020-10-30 DIAGNOSIS — Z801 Family history of malignant neoplasm of trachea, bronchus and lung: Secondary | ICD-10-CM | POA: Insufficient documentation

## 2020-10-30 LAB — CMP (CANCER CENTER ONLY)
ALT: 56 U/L — ABNORMAL HIGH (ref 0–44)
AST: 64 U/L — ABNORMAL HIGH (ref 15–41)
Albumin: 3.4 g/dL — ABNORMAL LOW (ref 3.5–5.0)
Alkaline Phosphatase: 127 U/L — ABNORMAL HIGH (ref 38–126)
Anion gap: 9 (ref 5–15)
BUN: 13 mg/dL (ref 6–20)
CO2: 28 mmol/L (ref 22–32)
Calcium: 9.1 mg/dL (ref 8.9–10.3)
Chloride: 105 mmol/L (ref 98–111)
Creatinine: 1.3 mg/dL — ABNORMAL HIGH (ref 0.44–1.00)
GFR, Estimated: 48 mL/min — ABNORMAL LOW (ref >60)
Glucose, Bld: 98 mg/dL (ref 70–99)
Potassium: 4 mmol/L (ref 3.5–5.1)
Sodium: 142 mmol/L (ref 135–145)
Total Bilirubin: 0.5 mg/dL (ref 0.3–1.2)
Total Protein: 7.1 g/dL (ref 6.5–8.1)

## 2020-10-30 LAB — CBC WITH DIFFERENTIAL/PLATELET
Abs Immature Granulocytes: 0.01 10*3/uL (ref 0.00–0.07)
Basophils Absolute: 0 10*3/uL (ref 0.0–0.1)
Basophils Relative: 1 %
Eosinophils Absolute: 0 10*3/uL (ref 0.0–0.5)
Eosinophils Relative: 1 %
HCT: 32.4 % — ABNORMAL LOW (ref 36.0–46.0)
Hemoglobin: 10.9 g/dL — ABNORMAL LOW (ref 12.0–15.0)
Immature Granulocytes: 0 %
Lymphocytes Relative: 60 %
Lymphs Abs: 2.6 10*3/uL (ref 0.7–4.0)
MCH: 33.9 pg (ref 26.0–34.0)
MCHC: 33.6 g/dL (ref 30.0–36.0)
MCV: 100.6 fL — ABNORMAL HIGH (ref 80.0–100.0)
Monocytes Absolute: 0.3 10*3/uL (ref 0.1–1.0)
Monocytes Relative: 6 %
Neutro Abs: 1.4 10*3/uL — ABNORMAL LOW (ref 1.7–7.7)
Neutrophils Relative %: 32 %
Platelets: 250 10*3/uL (ref 150–400)
RBC: 3.22 MIL/uL — ABNORMAL LOW (ref 3.87–5.11)
RDW: 15.8 % — ABNORMAL HIGH (ref 11.5–15.5)
WBC: 4.3 10*3/uL (ref 4.0–10.5)
nRBC: 0 % (ref 0.0–0.2)

## 2020-10-30 MED ORDER — FULVESTRANT 250 MG/5ML IM SOLN
500.0000 mg | Freq: Once | INTRAMUSCULAR | Status: AC
  Administered 2020-10-30: 500 mg via INTRAMUSCULAR

## 2020-10-30 MED ORDER — FULVESTRANT 250 MG/5ML IM SOLN
INTRAMUSCULAR | Status: AC
  Filled 2020-10-30: qty 10

## 2020-10-30 MED ORDER — ALBUTEROL SULFATE (2.5 MG/3ML) 0.083% IN NEBU: 2.5000 mg | INHALATION_SOLUTION | Freq: Four times a day (QID) | RESPIRATORY_TRACT | 0 refills | Status: DC | PRN

## 2020-10-30 MED ORDER — DIPHENOXYLATE-ATROPINE 2.5-0.025 MG PO TABS: 1.0000 | ORAL_TABLET | Freq: Three times a day (TID) | ORAL | 0 refills | Status: DC | PRN

## 2020-11-03 ENCOUNTER — Telehealth: Payer: Self-pay | Admitting: Hematology

## 2020-11-03 NOTE — Telephone Encounter (Signed)
Scheduled follow-up appointments per 5/27 los. Patient is aware.

## 2020-11-05 ENCOUNTER — Encounter: Payer: Self-pay | Admitting: Hematology

## 2020-11-10 ENCOUNTER — Other Ambulatory Visit: Payer: Self-pay

## 2020-11-10 ENCOUNTER — Encounter (HOSPITAL_COMMUNITY): Payer: Self-pay

## 2020-11-10 ENCOUNTER — Ambulatory Visit (HOSPITAL_COMMUNITY)
Admission: RE | Admit: 2020-11-10 | Discharge: 2020-11-10 | Disposition: A | Discharge status: Home / Self Care | Payer: 59 | Source: Ambulatory Visit | Attending: General Surgery | Admitting: General Surgery

## 2020-11-10 DIAGNOSIS — C50911 Malignant neoplasm of unspecified site of right female breast: Secondary | ICD-10-CM | POA: Insufficient documentation

## 2020-11-15 ENCOUNTER — Other Ambulatory Visit: Payer: Self-pay | Admitting: Hematology

## 2020-11-15 DIAGNOSIS — C50919 Malignant neoplasm of unspecified site of unspecified female breast: Secondary | ICD-10-CM

## 2020-11-16 NOTE — Pre-Procedure Instructions (Addendum)
Surgical Instructions:    Your procedure is scheduled on Monday, June 20th.  Report to [facility] Main Entrance "A" at 05:30 A.M., then check in with the Admitting office.  Call this number if you have any questions prior to, or have any problems the morning of surgery:  [phone]    Remember:  Do not eat after midnight the night before your surgery.  You may drink clear liquids until 04:30 AM the morning of your surgery.   Clear liquids allowed are: Water, Non-Citrus Juices (without pulp), Carbonated Beverages, Clear Tea, Black Coffee Only, and Gatorade.  The day of surgery (if you do NOT have diabetes):  Drink ONE (1) Pre-Surgery Clear Ensure by 04:30 AM the morning of surgery   This drink was given to you during your hospital pre-op appointment visit. Nothing else to drink after completing the Pre-Surgery Clear Ensure.         If you have questions, please contact your surgeon's office.      Take these medicines the morning of surgery with A SIP OF WATER: omeprazole (PRILOSEC OTC)  VERZENIO   IF NEEDED: acetaminophen (TYLENOL)  ondansetron (ZOFRAN)  diphenoxylate-atropine (LOMOTIL)  loperamide (IMODIUM)  Carboxymethylcellul-Glycerin (LUBRICATING EYE DROPS OP) albuterol (PROVENTIL) nebulizer treatment     As of today, STOP taking any Aspirin (unless otherwise instructed by your surgeon) Aleve, Naproxen, Ibuprofen, Motrin, Advil, Goody's, BC's, all herbal medications, fish oil, and all vitamins.             Special instructions:   [facility]- Preparing For Surgery  Before surgery, you can play an important role. Because skin is not sterile, your skin needs to be as free of germs as possible. You can reduce the number of germs on your skin by washing with CHG (chlorahexidine gluconate) Soap before surgery.  CHG is an antiseptic cleaner which kills germs and bonds with the skin to continue killing germs even after washing.    Oral Hygiene is also important to  reduce your risk of infection.  Remember - BRUSH YOUR TEETH THE MORNING OF SURGERY WITH YOUR REGULAR TOOTHPASTE  Please do not use if you have an allergy to CHG or antibacterial soaps. If your skin becomes reddened/irritated stop using the CHG.  Do not shave (including legs and underarms) for at least 48 hours prior to first CHG shower. It is OK to shave your face.  Please follow these instructions carefully.   Shower the NIGHT BEFORE SURGERY and the MORNING OF SURGERY  If you chose to wash your hair, wash your hair first as usual with your normal shampoo.  After you shampoo, rinse your hair and body thoroughly to remove the shampoo.  Wash Face and genitals (private parts) with your normal soap.   Use CHG Soap as you would any other liquid soap. You can apply CHG directly to the skin and wash gently with a scrungie or a clean washcloth.   Apply the CHG Soap to your body ONLY FROM THE NECK DOWN.  Do not use on open wounds or open sores. Avoid contact with your eyes, ears, mouth and genitals (private parts). Wash Face and genitals (private parts)  with your normal soap.   Wash thoroughly, paying special attention to the area where your surgery will be performed.  Thoroughly rinse your body with warm water from the neck down.  DO NOT shower/wash with your normal soap after using and rinsing off the CHG Soap.  Pat yourself dry with a CLEAN TOWEL.  Wear CLEAN PAJAMAS to bed the night before surgery.  Place CLEAN SHEETS on your bed the night before your surgery.  DO NOT SLEEP WITH PETS.   Day of Surgery: SHOWER with CHG soap. Brush your teeth WITH YOUR REGULAR TOOTHPASTE. Wear Clean/Comfortable clothing the morning of surgery. Do not apply any deodorants/lotions.   Do not wear jewelry, make up, or nail polish. Do not shave 48 hours prior to surgery.   Do not bring valuables to the hospital. [facility] is not responsible for any belongings or valuables.  Do NOT Smoke  (Tobacco/Vaping) or drink Alcohol 24 hours prior to your procedure.  If you use a CPAP at night, you may bring all equipment for your overnight stay.   Contacts, glasses, or dentures may not be worn into surgery, please bring cases for these belongings.   For patients admitted to the hospital, discharge time will be determined by your treatment team.   Patients discharged the day of surgery will not be allowed to drive home, and someone needs to stay with them for 24 hours.    Please read over the following fact sheets that you were given.

## 2020-11-17 ENCOUNTER — Encounter (HOSPITAL_COMMUNITY): Payer: Self-pay

## 2020-11-17 ENCOUNTER — Other Ambulatory Visit: Payer: Self-pay

## 2020-11-17 ENCOUNTER — Encounter (HOSPITAL_COMMUNITY)
Admission: RE | Admit: 2020-11-17 | Discharge: 2020-11-17 | Disposition: A | Discharge status: Home / Self Care | Payer: 59 | Source: Ambulatory Visit | Attending: General Surgery | Admitting: General Surgery

## 2020-11-17 DIAGNOSIS — K219 Gastro-esophageal reflux disease without esophagitis: Secondary | ICD-10-CM | POA: Diagnosis not present

## 2020-11-17 DIAGNOSIS — C50911 Malignant neoplasm of unspecified site of right female breast: Secondary | ICD-10-CM | POA: Diagnosis not present

## 2020-11-17 DIAGNOSIS — Z79899 Other long term (current) drug therapy: Secondary | ICD-10-CM | POA: Insufficient documentation

## 2020-11-17 DIAGNOSIS — F17210 Nicotine dependence, cigarettes, uncomplicated: Secondary | ICD-10-CM | POA: Insufficient documentation

## 2020-11-17 DIAGNOSIS — Z01818 Encounter for other preprocedural examination: Secondary | ICD-10-CM | POA: Insufficient documentation

## 2020-11-17 HISTORY — DX: Personal history of other diseases of the digestive system: Z87.19

## 2020-11-17 HISTORY — DX: Other specified postprocedural states: R11.2

## 2020-11-17 HISTORY — DX: Other specified postprocedural states: Z98.890

## 2020-11-17 HISTORY — DX: Anxiety disorder, unspecified: F41.9

## 2020-11-17 HISTORY — DX: Dyspnea, unspecified: R06.00

## 2020-11-17 NOTE — Progress Notes (Signed)
PCP - Burr Medico, NP with [place] Cardiologist - Lesle Reek Pulmonology- Dr. [name] Oncology- Dr. Irene Limbo  PPM/ICD - denies  Chest x-ray - 11/01/19 EKG - 11/17/20 Stress Test - n/a ECHO - 09/05/19 Cardiac Cath - 10/05/04  Sleep Study - denies CPAP - n/a  Pt is not a diabetic.  Blood Thinner Instructions: n/a Aspirin Instructions: n/a  ERAS Protcol - yes PRE-SURGERY Ensure or G2- pre surgury ensure given  COVID TEST- n/a-ambulatory surgery.   Anesthesia review: yes, cardiac & pulmonary history, history of anesthesia complications.  Patient denies shortness of breath, fever, cough and chest pain at PAT appointment   All instructions explained to the patient, with a verbal understanding of the material. Patient agrees to go over the instructions while at home for a better understanding. Patient also instructed to self quarantine after being tested for COVID-19. The opportunity to ask questions was provided.

## 2020-11-18 NOTE — Progress Notes (Signed)
Anesthesia Chart Review:  Case: [id number] Date/Time: 11/23/20 0715   Procedure: RIGHT BREAST LUMPECTOMY WITH RADIOACTIVE SEED AND RIGHT AXILLARY SENTINEL LYMPH NODE BIOPSY (Right: Breast)   Anesthesia type: General   Pre-op diagnosis: RIGHT BREAST CANCER   Location: [place] OR ROOM 02 / [place] [facility]   Surgeons: Rolm Bookbinder, MD       DISCUSSION: Patient is a 55 year old scheduled the above procedure. RSL is scheduled for 11/20/20.    History includes smoking, murmur (for ASD, now s/p closure with Amplatzer device repair 10/05/04 Dr. 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 Last echo was done in April 2021 during an admission for during admission for N/V/D, SIRS in setting of oral chemo. LVEF 55-60%, no regional wall motion abnormalities, grade 1 diastolic dysfunction, mild MR, s/p ASD closure. It was a technically difficult study and there was question of possible density projecting from the device on apical views; however hopsitalist reviewed report and no TEE ordered. Ultimately, symptoms attributed to viral gastroenteritis, as she recovered rather quickly from her symptoms, and stool testing was negative for C. Difficile and positive for Norovirus.    She denied SOB, chest pain, cough, fever per PAT RN interview.  Reviewed history and 2021 echo report with anesthesiologist Annye Asa, MD. Anesthesia team to evaluate on the day of surgery.    VS: BP 139/81   Pulse (!) 110   Temp 36.8 C   Resp 18   SpO2 99%  HR 98 bpm  on EKG.   PROVIDERS: Burr Medico, NP is PCP  Sullivan Lone, MD is HEM-ONC. Last visit 10/30/20 with planned follow-up after lumpectomy.  Rayann Heman, MD is pulmonologist. Last visit 04/01/20.  Myriam Jacobson, MD is interventional cardiologist (for ASD closure). Last visit in 2007 ([facility]).   LABS: Labs reviewed: Acceptable for surgery. Labs from 10/30/20 noted. AST 63, ALT 56 but both down from > 100 10/02/20.  Lab Results  Component Value Date   WBC 4.3 10/30/2020   HGB 10.9 (L) 10/30/2020   HCT 32.4 (L) 10/30/2020   PLT 250 10/30/2020   GLUCOSE 98 10/30/2020   TRIG 301 (H) 08/04/2019   ALT 56 (H) 10/30/2020   AST 64 (H) 10/30/2020   NA 142 10/30/2020   K 4.0 10/30/2020   CL 105 10/30/2020   CREATININE 1.30 (H) 10/30/2020   BUN 13 10/30/2020   CO2 28 10/30/2020   TSH 2.083 09/06/2019   INR 1.0 11/01/2019     Spirometry 02/16/21: FVC 2.60 (83%), post 2.57 (81%), FEV1 2.22 (89%), post 2.35 (94%).     IMAGES: PET Scan 08/07/20: IMPRESSION: - 8 mm irregular nodule in the right lung apex is unchanged from recent CTs but improved from 2020, non FDG avid on the current PET. This favors a treated metastasis. - Additional pulmonary metastases have resolved. - No findings suspicious for new/progressive disease on PET.   CT Chest 05/21/20: IMPRESSION: 1. No imaging findings of interstitial lung disease. 2. 1.4 x 1.0 x 0.8 cm aggressive appearing nodule near the apex of the right upper lobe. In this location this  could simply represent an area of pleuroparenchymal scarring from prior infection, however, close attention on follow-up studies is recommended. Consider one of the following in 3 months for both low-risk and high-risk individuals: (a) repeat chest CT or (b) follow-up PET-CT. This recommendation follows the consensus statement: Guidelines for Management of Incidental Pulmonary Nodules Detected on CT Images: From the Fleischner Society 2017; Radiology 2017;  284:228-243. 3. Mild air trapping indicative of small airways disease. 4. Mild dilatation of the pulmonic trunk (3.4 cm in diameter), concerning for pulmonary arterial hypertension. 5. Aortic atherosclerosis.   EKG: 11/17/20: Normal sinus rhythm Normal ECG No significant change since last tracing Confirmed by [place] ([id number]) on 11/17/2020 8:46:00 PM   CV: Echo 09/05/19 (during admission for N/V/D, SIRS, + Norovirus; echo ordered/reviewed by Hospitalists, no TEE ordered): IMPRESSIONS   1. Technically difficult; normal LV function; grade 1 diastolic  dysfunction; mild MR; s/p ASD closure; possible density projecting from  device on apical views; suggest TEE to further assess.   2. Left ventricular ejection fraction, by estimation, is 55 to 60%. The  left ventricle has normal function. The left ventricle has no regional  wall motion abnormalities. Left ventricular diastolic parameters are  consistent with Grade I diastolic  dysfunction (impaired relaxation). Elevated left atrial pressure.   3. Right ventricular systolic function is normal. The right ventricular  size is normal.   4. The mitral valve is normal in structure. Mild mitral valve  regurgitation. No evidence of mitral stenosis.   5. The aortic valve is normal in structure. Aortic valve regurgitation is  not visualized. No aortic stenosis is present.  - Comparison 12/15/05 [facility]: Normal left ventricular function, mild LVH. LVEF > 55%, grade 1 DD, trivial MR/PR/TR.   Cardiac cath 10/05/04 [facility]): Left main is listed as normal on diagnostic summary, but < 25% on the report. LAD, LCX, and RCA systems documented as normal. No pulmonary hypertension. 18 -mm secundum ASD. Left dominance. Normal LA size. Mildly enlarged RV. Successful ASD occluder deployment. (By notes, follow-up echo showed enlarged RV, but proper positioning of the ASD device.)   Cardiac MRI 10/01/04 (PRE-ASD closure; [facility]):  Summary: 1. The left  ventricle is normal in size and wall thickness. Global and regional left ventricular systolic function is normal. Visual EF is 60%. 2. The right ventricle is moderately enlarged with normal systolic function. The right atrium is moderately enlarged. 3. Mild to moderate tricuspid regurgitation is present. 4. The anterior leaflet of the mitral valve appears mildly thickened without clinically significant mitral regurgitation. 5. A trivial pericardial effusion is present. 6. Ostium secundum ASD is identified and measures 2.4 x 1.5 cm. The flow across the ASD is calculated as 101 cm/s peak velocity and 6.7 L/m. 7. The Qp/Qs measures 1.9. Normal wall thickness. Normal cavity size. No AI. No AS. RV moderately enlarged cavity. Mild thickening of MV with trivial MR. No MS. Normal left atrium. Moderately enlarged right atrium. Trivial pericardial effusion. Fully mobile pulmonic valve. No PI or PS. Severe calcification of TV with mil TR. No TS.     Past Medical History:  Diagnosis Date   Acute pansinusitis 08/02/2017   Anxiety    Arthritis    ASD (atrial septal defect)    s/p closure with Amplatzer device 10/05/04 (Dr. Myriam Jacobson, [place]) 10/05/04   Cancer ([place])    Cataract    Dyspnea    GERD (gastroesophageal reflux disease)    Heart murmur    no longer heard  History of hiatal hernia    Legally blind in right eye, as defined in [place]    Lumbar herniated disc    PONV (postoperative nausea and vomiting)    Sciatica     Past Surgical History:  Procedure Laterality Date   ABLATION     BREAST SURGERY Bilateral 2011   Breast Reduction Surgery   BUNIONECTOMY     CARDIAC CATHETERIZATION     10/05/04 [facility]): LM < 25%, otherwise normal coronaries. No pulmonary HTN, Mildly enlarged RV. Secundum ASD s/p closure.   CARDIAC SURGERY     CATARACT EXTRACTION     CLEFT PALATE REPAIR     s/p cleft lip and palate repair   EYE SURGERY Right 2019   right eye removed   LUMBAR LAMINECTOMY/DECOMPRESSION  MICRODISCECTOMY Left 05/23/2016   Procedure: LEFT L4-L5 LATERAL RECESS DECOMPRESSION WITH CENTRAL AND RIGHT DECOMPRESSION VIA LEFT SIDE;  Surgeon: [name], MD;  Location: [place];  Service: Orthopedics;  Laterality: Left;   LUMBAR LAMINECTOMY/DECOMPRESSION MICRODISCECTOMY Left 05/23/2016   Procedure: LUMBAR LAMINECTOMY/DECOMPRESSION MICRODISCECTOMY Lumbar five - Sacral One 1 LEVEL;  Surgeon: [name], MD;  Location: [place];  Service: Orthopedics;  Laterality: Left;   REDUCTION MAMMAPLASTY  2011   SHOULDER INJECTION Left 05/23/2016   Procedure: SHOULDER INJECTION;  Surgeon: [name], MD;  Location: [place];  Service: Orthopedics;  Laterality: Left;  band-aid per pa-c   TRANSTHORACIC ECHOCARDIOGRAM     12/15/05 [facility]): Mild LVH, EF > 29%, grade 1 diastolic dysfunction, Trivial MR/PR/TR.   TUBAL LIGATION      MEDICATIONS:  acetaminophen (TYLENOL) 500 MG tablet   albuterol (PROVENTIL) (2.5 MG/3ML) 0.083% nebulizer solution   ALPRAZolam (XANAX) 1 MG tablet   amitriptyline (ELAVIL) 75 MG tablet   Carboxymethylcellul-Glycerin (LUBRICATING EYE DROPS OP)   Cyanocobalamin (B-12 PO)   diphenoxylate-atropine (LOMOTIL) 2.5-0.025 MG tablet   loperamide (IMODIUM) 2 MG capsule   Magnesium 500 MG TABS   Multiple Vitamin (MULTIVITAMIN WITH MINERALS) TABS tablet   omeprazole (PRILOSEC OTC) 20 MG tablet   ondansetron (ZOFRAN) 8 MG tablet   Travoprost, BAK Free, (TRAVATAN) 0.004 % SOLN ophthalmic solution   VERZENIO 100 MG tablet   Vitamin D, Ergocalciferol, (DRISDOL) 1.25 MG (50000 UNIT) CAPS capsule   White Petrolatum-Mineral Oil (LUBRICANT EYE NIGHTTIME) OINT    0.9 %  sodium chloride infusion    Myra Gianotti, PA-C Surgical Short Stay/Anesthesiology [facility] Phone [phone] [facility] Phone [phone] 11/19/2020 9:53 AM

## 2020-11-19 NOTE — Anesthesia Preprocedure Evaluation (Addendum)
Anesthesia Evaluation  Patient identified by MRN, date of birth, ID band Patient awake    Reviewed: Allergy & Precautions, NPO status , Patient's Chart, lab work & pertinent test results  History of Anesthesia Complications Negative for: history of anesthetic complications  Airway Mallampati: I  TM Distance: >3 FB Neck ROM: Full   Comment: H/o cleft lip/palate repair. In 2017 patient was grade 1 view with MAC 3, easy MV. 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)  negative genitourinary   Musculoskeletal  (+) Arthritis ,   Abdominal   Peds  Hematology  (+) anemia ,   Anesthesia Other Findings  R breast cancer w/ suspected pulmonary mets  Reproductive/Obstetrics                           Anesthesia Physical Anesthesia Plan  ASA: 3  Anesthesia Plan: General   Post-op Pain Management: GA combined w/ Regional for post-op pain   Induction: Intravenous  PONV Risk Score and Plan: 3 and Ondansetron, Dexamethasone, Midazolam and Treatment may vary due to age or medical condition  Airway Management Planned: LMA  Additional Equipment: None  Intra-op Plan:   Post-operative Plan: Extubation in OR  Informed Consent: I have reviewed the patients History and Physical, chart, labs and discussed the procedure including the risks, benefits and alternatives for the proposed anesthesia with the patient or authorized representative who has indicated his/her understanding and acceptance.     Dental advisory given  Plan  Discussed with:   Anesthesia Plan Comments: (PAT note written 11/19/2020 by Myra Gianotti, PA-C. )       Anesthesia Quick Evaluation

## 2020-11-20 ENCOUNTER — Other Ambulatory Visit: Payer: Self-pay

## 2020-11-20 ENCOUNTER — Encounter: Payer: Self-pay | Admitting: Hematology

## 2020-11-20 ENCOUNTER — Ambulatory Visit
Admission: RE | Admit: 2020-11-20 | Discharge: 2020-11-20 | Disposition: A | Discharge status: Home / Self Care | Payer: 59 | Source: Ambulatory Visit | Attending: General Surgery | Admitting: General Surgery

## 2020-11-20 DIAGNOSIS — C50911 Malignant neoplasm of unspecified site of right female breast: Secondary | ICD-10-CM

## 2020-11-23 ENCOUNTER — Ambulatory Visit (HOSPITAL_COMMUNITY)
Admission: RE | Admit: 2020-11-23 | Discharge: 2020-11-23 | Disposition: A | Discharge status: Home / Self Care | Payer: 59 | Attending: General Surgery | Admitting: General Surgery

## 2020-11-23 ENCOUNTER — Encounter (HOSPITAL_COMMUNITY)
Admission: RE | Disposition: A | Discharge status: Home / Self Care | Payer: Self-pay | Source: Home / Self Care | Attending: General Surgery

## 2020-11-23 ENCOUNTER — Encounter (HOSPITAL_COMMUNITY)
Admission: RE | Admit: 2020-11-23 | Discharge: 2020-11-23 | Disposition: A | Discharge status: Home / Self Care | Payer: 59 | Source: Ambulatory Visit | Attending: General Surgery | Admitting: General Surgery

## 2020-11-23 ENCOUNTER — Encounter (HOSPITAL_COMMUNITY): Payer: Self-pay | Admitting: General Surgery

## 2020-11-23 ENCOUNTER — Ambulatory Visit (HOSPITAL_COMMUNITY): Payer: 59 | Admitting: Physician Assistant

## 2020-11-23 ENCOUNTER — Ambulatory Visit
Admission: RE | Admit: 2020-11-23 | Discharge: 2020-11-23 | Disposition: A | Discharge status: Home / Self Care | Payer: 59 | Source: Ambulatory Visit | Attending: General Surgery | Admitting: General Surgery

## 2020-11-23 DIAGNOSIS — Z881 Allergy status to other antibiotic agents status: Secondary | ICD-10-CM | POA: Diagnosis not present

## 2020-11-23 DIAGNOSIS — Z833 Family history of diabetes mellitus: Secondary | ICD-10-CM | POA: Insufficient documentation

## 2020-11-23 DIAGNOSIS — Z8774 Personal history of (corrected) congenital malformations of heart and circulatory system: Secondary | ICD-10-CM | POA: Diagnosis not present

## 2020-11-23 DIAGNOSIS — C50911 Malignant neoplasm of unspecified site of right female breast: Secondary | ICD-10-CM | POA: Diagnosis present

## 2020-11-23 DIAGNOSIS — Z79899 Other long term (current) drug therapy: Secondary | ICD-10-CM | POA: Insufficient documentation

## 2020-11-23 DIAGNOSIS — Z801 Family history of malignant neoplasm of trachea, bronchus and lung: Secondary | ICD-10-CM | POA: Insufficient documentation

## 2020-11-23 DIAGNOSIS — Z170 Estrogen receptor positive status [ER+]: Secondary | ICD-10-CM | POA: Insufficient documentation

## 2020-11-23 DIAGNOSIS — C50411 Malignant neoplasm of upper-outer quadrant of right female breast: Secondary | ICD-10-CM | POA: Insufficient documentation

## 2020-11-23 DIAGNOSIS — Z8249 Family history of ischemic heart disease and other diseases of the circulatory system: Secondary | ICD-10-CM | POA: Diagnosis not present

## 2020-11-23 DIAGNOSIS — Z87891 Personal history of nicotine dependence: Secondary | ICD-10-CM | POA: Insufficient documentation

## 2020-11-23 HISTORY — PX: BREAST LUMPECTOMY WITH RADIOACTIVE SEED AND SENTINEL LYMPH NODE BIOPSY: SHX6550

## 2020-11-23 SURGERY — BREAST LUMPECTOMY WITH RADIOACTIVE SEED AND SENTINEL LYMPH NODE BIOPSY
Anesthesia: General | Site: Breast | Laterality: Right

## 2020-11-23 MED ORDER — BUPIVACAINE-EPINEPHRINE (PF) 0.25% -1:200000 IJ SOLN
INTRAMUSCULAR | Status: AC
  Filled 2020-11-23: qty 30

## 2020-11-23 MED ORDER — CEFAZOLIN SODIUM-DEXTROSE 2-4 GM/100ML-% IV SOLN
INTRAVENOUS | Status: AC
  Filled 2020-11-23: qty 100

## 2020-11-23 MED ORDER — ONDANSETRON HCL 4 MG/2ML IJ SOLN
INTRAMUSCULAR | Status: AC
  Filled 2020-11-23: qty 2

## 2020-11-23 MED ORDER — BUPIVACAINE HCL (PF) 0.25 % IJ SOLN
INTRAMUSCULAR | Status: DC | PRN
  Administered 2020-11-23: 30 mL

## 2020-11-23 MED ORDER — FENTANYL CITRATE (PF) 250 MCG/5ML IJ SOLN
INTRAMUSCULAR | Status: AC
  Filled 2020-11-23: qty 5

## 2020-11-23 MED ORDER — SODIUM CHLORIDE (PF) 0.9 % IJ SOLN
INTRAMUSCULAR | Status: AC
  Filled 2020-11-23: qty 10

## 2020-11-23 MED ORDER — OXYCODONE HCL 5 MG PO TABS
ORAL_TABLET | ORAL | Status: AC
  Filled 2020-11-23: qty 1

## 2020-11-23 MED ORDER — DEXAMETHASONE SODIUM PHOSPHATE 4 MG/ML IJ SOLN
INTRAMUSCULAR | Status: DC | PRN
  Administered 2020-11-23: 10 mg via INTRAVENOUS

## 2020-11-23 MED ORDER — CHLORHEXIDINE GLUCONATE 0.12 % MT SOLN
OROMUCOSAL | Status: AC
  Filled 2020-11-23: qty 15

## 2020-11-23 MED ORDER — LACTATED RINGERS IV SOLN: INTRAVENOUS | Status: DC

## 2020-11-23 MED ORDER — CHLORHEXIDINE GLUCONATE 0.12 % MT SOLN
15.0000 mL | Freq: Once | OROMUCOSAL | Status: AC
  Administered 2020-11-23: 15 mL via OROMUCOSAL

## 2020-11-23 MED ORDER — 0.9 % SODIUM CHLORIDE (POUR BTL) OPTIME
TOPICAL | Status: DC | PRN
  Administered 2020-11-23: 1000 mL

## 2020-11-23 MED ORDER — PHENYLEPHRINE 40 MCG/ML (10ML) SYRINGE FOR IV PUSH (FOR BLOOD PRESSURE SUPPORT)
PREFILLED_SYRINGE | INTRAVENOUS | Status: DC | PRN
  Administered 2020-11-23: 80 ug via INTRAVENOUS
  Administered 2020-11-23: 40 ug via INTRAVENOUS

## 2020-11-23 MED ORDER — ONDANSETRON HCL 4 MG/2ML IJ SOLN
INTRAMUSCULAR | Status: DC | PRN
  Administered 2020-11-23: 4 mg via INTRAVENOUS

## 2020-11-23 MED ORDER — BUPIVACAINE-EPINEPHRINE 0.25% -1:200000 IJ SOLN
INTRAMUSCULAR | Status: DC | PRN
  Administered 2020-11-23: 5 mL

## 2020-11-23 MED ORDER — OXYCODONE HCL 5 MG PO TABS
5.0000 mg | ORAL_TABLET | Freq: Once | ORAL | Status: AC | PRN
  Administered 2020-11-23: 5 mg via ORAL

## 2020-11-23 MED ORDER — CEFAZOLIN SODIUM-DEXTROSE 2-4 GM/100ML-% IV SOLN
2.0000 g | INTRAVENOUS | Status: AC
  Administered 2020-11-23: 2 g via INTRAVENOUS

## 2020-11-23 MED ORDER — METHYLENE BLUE 0.5 % INJ SOLN
INTRAVENOUS | Status: AC
  Filled 2020-11-23: qty 10

## 2020-11-23 MED ORDER — FENTANYL CITRATE (PF) 250 MCG/5ML IJ SOLN
INTRAMUSCULAR | Status: DC | PRN
  Administered 2020-11-23 (×2): 50 ug via INTRAVENOUS

## 2020-11-23 MED ORDER — MIDAZOLAM HCL 2 MG/2ML IJ SOLN
INTRAMUSCULAR | Status: AC
  Filled 2020-11-23: qty 2

## 2020-11-23 MED ORDER — ENSURE PRE-SURGERY PO LIQD
296.0000 mL | Freq: Once | ORAL | Status: DC
Start: 2020-11-24 — End: 2020-11-23

## 2020-11-23 MED ORDER — ONDANSETRON HCL 4 MG/2ML IJ SOLN: 4.0000 mg | Freq: Once | INTRAMUSCULAR | Status: DC | PRN

## 2020-11-23 MED ORDER — TECHNETIUM TC 99M TILMANOCEPT KIT
1.0000 | PACK | Freq: Once | INTRAVENOUS | Status: AC | PRN
  Administered 2020-11-23: 1 via INTRADERMAL

## 2020-11-23 MED ORDER — FENTANYL CITRATE (PF) 100 MCG/2ML IJ SOLN: 25.0000 ug | INTRAMUSCULAR | Status: DC | PRN

## 2020-11-23 MED ORDER — HYDROCODONE-ACETAMINOPHEN 5-325 MG PO TABS: 1.0000 | ORAL_TABLET | ORAL | 0 refills | Status: DC | PRN

## 2020-11-23 MED ORDER — PHENYLEPHRINE HCL-NACL 10-0.9 MG/250ML-% IV SOLN
INTRAVENOUS | Status: DC | PRN
  Administered 2020-11-23: 25 ug/min via INTRAVENOUS

## 2020-11-23 MED ORDER — PROPOFOL 10 MG/ML IV BOLUS
INTRAVENOUS | Status: DC | PRN
  Administered 2020-11-23: 120 mg via INTRAVENOUS
  Administered 2020-11-23: 50 mg via INTRAVENOUS
  Administered 2020-11-23: 30 mg via INTRAVENOUS

## 2020-11-23 MED ORDER — ACETAMINOPHEN 500 MG PO TABS
ORAL_TABLET | ORAL | Status: AC
  Filled 2020-11-23: qty 2

## 2020-11-23 MED ORDER — MIDAZOLAM HCL 5 MG/5ML IJ SOLN
INTRAMUSCULAR | Status: DC | PRN
  Administered 2020-11-23: 1 mg via INTRAVENOUS

## 2020-11-23 MED ORDER — ORAL CARE MOUTH RINSE: 15.0000 mL | Freq: Once | OROMUCOSAL | Status: AC

## 2020-11-23 MED ORDER — AMISULPRIDE (ANTIEMETIC) 5 MG/2ML IV SOLN
10.0000 mg | Freq: Once | INTRAVENOUS | Status: DC | PRN
Start: 2020-11-23 — End: 2020-11-23

## 2020-11-23 MED ORDER — OXYCODONE HCL 5 MG/5ML PO SOLN: 5.0000 mg | Freq: Once | ORAL | Status: AC | PRN

## 2020-11-23 MED ORDER — DEXAMETHASONE SODIUM PHOSPHATE 10 MG/ML IJ SOLN
INTRAMUSCULAR | Status: AC
  Filled 2020-11-23: qty 1

## 2020-11-23 MED ORDER — PROPOFOL 10 MG/ML IV BOLUS
INTRAVENOUS | Status: AC
  Filled 2020-11-23: qty 40

## 2020-11-23 MED ORDER — LACTATED RINGERS IV SOLN: INTRAVENOUS | Status: DC | PRN

## 2020-11-23 MED ORDER — ACETAMINOPHEN 500 MG PO TABS
1000.0000 mg | ORAL_TABLET | ORAL | Status: AC
  Administered 2020-11-23: 1000 mg via ORAL

## 2020-11-23 MED ORDER — SODIUM CHLORIDE (PF) 0.9 % IJ SOLN
INTRAVENOUS | Status: DC | PRN
  Administered 2020-11-23: 5 mL

## 2020-11-23 MED ORDER — LIDOCAINE 2% (20 MG/ML) 5 ML SYRINGE
INTRAMUSCULAR | Status: DC | PRN
  Administered 2020-11-23: 60 mg via INTRAVENOUS

## 2020-11-23 SURGICAL SUPPLY — 44 items
APPLIER CLIP 9.375 MED OPEN (MISCELLANEOUS) ×2
BINDER BREAST LRG (GAUZE/BANDAGES/DRESSINGS) IMPLANT
BINDER BREAST XLRG (GAUZE/BANDAGES/DRESSINGS) IMPLANT
BINDER BREAST XXLRG (GAUZE/BANDAGES/DRESSINGS) ×2 IMPLANT
CANISTER SUCT 3000ML PPV (MISCELLANEOUS) ×2 IMPLANT
CHLORAPREP W/TINT 26 (MISCELLANEOUS) ×2 IMPLANT
CLIP APPLIE 9.375 MED OPEN (MISCELLANEOUS) ×1 IMPLANT
CLIP VESOCCLUDE MED 6/CT (CLIP) ×2 IMPLANT
COVER PROBE W GEL 5X96 (DRAPES) ×2 IMPLANT
COVER SURGICAL LIGHT HANDLE (MISCELLANEOUS) ×2 IMPLANT
COVER WAND RF STERILE (DRAPES) ×2 IMPLANT
DERMABOND ADVANCED (GAUZE/BANDAGES/DRESSINGS) ×1
DERMABOND ADVANCED .7 DNX12 (GAUZE/BANDAGES/DRESSINGS) ×1 IMPLANT
DEVICE DUBIN SPECIMEN MAMMOGRA (MISCELLANEOUS) ×2 IMPLANT
DRAPE CHEST BREAST 15X10 FENES (DRAPES) ×2 IMPLANT
ELECT COATED BLADE 2.86 ST (ELECTRODE) ×2 IMPLANT
ELECT REM PT RETURN 9FT ADLT (ELECTROSURGICAL) ×2
ELECTRODE REM PT RTRN 9FT ADLT (ELECTROSURGICAL) ×1 IMPLANT
GLOVE BIO SURGEON STRL SZ7 (GLOVE) ×2 IMPLANT
GLOVE BIOGEL PI IND STRL 7.5 (GLOVE) ×1 IMPLANT
GLOVE BIOGEL PI INDICATOR 7.5 (GLOVE) ×1
GOWN STRL REUS W/ TWL LRG LVL3 (GOWN DISPOSABLE) ×2 IMPLANT
GOWN STRL REUS W/TWL LRG LVL3 (GOWN DISPOSABLE) ×4
ILLUMINATOR WAVEGUIDE N/F (MISCELLANEOUS) IMPLANT
KIT BASIN OR (CUSTOM PROCEDURE TRAY) ×2 IMPLANT
KIT MARKER MARGIN INK (KITS) ×2 IMPLANT
NEEDLE 18GX1X1/2 (RX/OR ONLY) (NEEDLE) ×2 IMPLANT
NEEDLE FILTER BLUNT 18X 1/2SAF (NEEDLE)
NEEDLE FILTER BLUNT 18X1 1/2 (NEEDLE) IMPLANT
NEEDLE HYPO 25GX1X1/2 BEV (NEEDLE) ×4 IMPLANT
NS IRRIG 1000ML POUR BTL (IV SOLUTION) ×2 IMPLANT
PACK GENERAL/GYN (CUSTOM PROCEDURE TRAY) ×2 IMPLANT
RETRACTOR ONETRAX LX 90X20 (MISCELLANEOUS) ×2 IMPLANT
STRIP CLOSURE SKIN 1/2X4 (GAUZE/BANDAGES/DRESSINGS) ×2 IMPLANT
SUT MNCRL AB 4-0 PS2 18 (SUTURE) ×4 IMPLANT
SUT MON AB 5-0 PS2 18 (SUTURE) ×2 IMPLANT
SUT SILK 2 0 SH (SUTURE) ×4 IMPLANT
SUT VIC AB 2-0 SH 27 (SUTURE) ×6
SUT VIC AB 2-0 SH 27XBRD (SUTURE) ×3 IMPLANT
SUT VIC AB 3-0 SH 27 (SUTURE) ×4
SUT VIC AB 3-0 SH 27X BRD (SUTURE) ×2 IMPLANT
SYR CONTROL 10ML LL (SYRINGE) ×2 IMPLANT
TOWEL GREEN STERILE (TOWEL DISPOSABLE) ×2 IMPLANT
TOWEL GREEN STERILE FF (TOWEL DISPOSABLE) ×2 IMPLANT

## 2020-11-23 NOTE — Op Note (Signed)
Preoperative diagnosis: Right breast cancer, preoperative diagnosis: Right breast cancer, possible stage IV disease Postoperative diagnosis: Same as above Procedure: 1.  Right breast radioactive seed guided lumpectomy 2.  Right axillary sentinel lymph node biopsy, deep 3.  Injection of methylene blue dye for sentinel lymph node identification Surgeon: Dr. Serita Grammes Anesthesia: General with a pectoral block Estimated blood loss: 20 cc Complications: None Drains: None Specimens: 1.  Right breast tissue marked with paint 2.  Additional posterior, inferior, medial, and lateral margins marked short superior, long lateral, double deep 3.  Right deep axillary sentinel lymph nodes with highest count of 295 Sponge and count was correct completion Decision to recovery stable condition  Indications: 30 yof with possible stage IV breast acncer.   she was evaluated previously with ct scan that showed a 5 cm spiculated mass in the right breast and multiple small pulmonary nodules concerning for mets.  she had a mm/us that shows a 4 cm lobulated mass in the right breast.  there is one satellite nodule as well near that.  [place] of her axilla is negative.  two biopsies are done with finding of IDC that is er pos, pr neg, her 2 neg with ki on low end.  she had a 1.4x1x0.8 cm nodule near apex of rul in 2021.  she has pet scan from 3/22 that shows 8 mm irregular nodule right lung that is non FDG avid.  presumed stage IV disease and treated by oncology. currently on verzenio and faslodex.  she had repeat mm 3/22 that shows the no real residual mass at tumor site but prominent architectural distortion, there are some suspicious microcalcs that measure 9 mm.  there is also right sided architectural distortion that has undergone biopsy and is concordant dense fibrosis (history of reduction).  she had [place] that shows no abnormal ax nodes. she has been referred back for surgical consideration. We discussed at tumor board  and I think reasonable to given benefit of doubt and proceed with surgical therapy as I am not convinced she has stage IV disease.  Procedure: After informed consent was obtained the patient first underwent a pectoral block.  She was also injected with Lymphoseek in the standard periareolar fashion.  She was given antibiotics.  SCDs were placed.  She was then placed under general anesthesia without complication.  She was prepped and draped in standard sterile surgical fashion.  Surgical timeout was then performed.  I injected a mixture of methylene blue and saline in the periareolar fashion as well as in the right upper outer quadrant due to her prior surgery.  I massaged this.  The seed was directly behind the nipple areolar complex.  This was difficult to identify at first due to the crossover with the Lymphoseek.  I infiltrated Marcaine and made a superior periareolar incision in order to hide the scar later.  I then dissected deep on the nipple areola was able to identify the seed.  I removed the seed the palpable mass and the surrounding tissue in order to get a clear margin.  I then marked this with paint and passed off the table.  The seed was present in the specimen.  I then removed additional margins as above as these appeared close to me.  The posterior margin is now the muscle.  I then placed a couple of clips in the cavity.  I then closed the cavity with 2-0 Vicryl.  The skin was closed with 3-0 Vicryl 5-0 Monocryl.  Glue and  Steri-Strips were later applied.  Was able to identify some radioactivity in the axilla.  I then infiltrated Marcaine and made an incision.  I carried this through the axillary fascia.  I was able to identify what appeared to be a couple of radioactive nodes.  1 of these was also blue.  I remove these and passed these off the table.  There is no real background radioactivity.  I obtained hemostasis.  I then closed the axillary fascia with 2-0 Vicryl.  The skin was closed  with 3-0 Vicryl and 4-0 Monocryl.  Glue and Steri-Strips were applied.  She tolerated this well was extubated and transferred to recovery stable.

## 2020-11-23 NOTE — Anesthesia Postprocedure Evaluation (Signed)
Anesthesia Post Note  Patient: Brandy [name]  Procedure(s) Performed: RIGHT BREAST LUMPECTOMY WITH RADIOACTIVE SEED AND RIGHT AXILLARY SENTINEL LYMPH NODE BIOPSY (Right: Breast)     Patient location during evaluation: PACU Anesthesia Type: General Level of consciousness: awake and alert Pain management: pain level controlled Vital Signs Assessment: post-procedure vital signs reviewed and stable Respiratory status: spontaneous breathing, nonlabored ventilation and respiratory function stable Cardiovascular status: blood pressure returned to baseline and stable Postop Assessment: no apparent nausea or vomiting Anesthetic complications: no   No notable events documented.  Last Vitals:  Vitals:   11/23/20 0930 11/23/20 0937  BP:  105/77  Pulse: 73 76  Resp: (!) 21 14  Temp:    SpO2: 100% 97%    Last Pain:  Vitals:   11/23/20 0937  TempSrc:   PainSc: 6                  Lidia Collum

## 2020-11-23 NOTE — Anesthesia Procedure Notes (Signed)
Procedure Name: LMA Insertion Date/Time: 11/23/2020 7:41 AM Performed by: Donnelly Angelica, RN Pre-anesthesia Checklist: Patient identified, Emergency Drugs available, Suction available and Patient being monitored Patient Re-evaluated:Patient Re-evaluated prior to induction Oxygen Delivery Method: Circle System Utilized Preoxygenation: Pre-oxygenation with 100% oxygen Induction Type: IV induction Ventilation: Mask ventilation without difficulty LMA: LMA inserted LMA Size: 4.0 Number of attempts: 1 Placement Confirmation: positive ETCO2 Tube secured with: Tape Dental Injury: Teeth and Oropharynx as per pre-operative assessment

## 2020-11-23 NOTE — Interval H&P Note (Signed)
History and Physical Interval Note:  11/23/2020 7:22 AM  Brandy [name]  has presented today for surgery, with the diagnosis of RIGHT BREAST CANCER.  The various methods of treatment have been discussed with the patient and family. After consideration of risks, benefits and other options for treatment, the patient has consented to  Procedure(s): RIGHT BREAST LUMPECTOMY WITH RADIOACTIVE SEED AND RIGHT AXILLARY SENTINEL LYMPH NODE BIOPSY (Right) as a surgical intervention.  The patient's history has been reviewed, patient examined, no change in status, stable for surgery.  I have reviewed the patient's chart and labs.  Questions were answered to the patient's satisfaction.     Rolm Bookbinder

## 2020-11-23 NOTE — Discharge Instructions (Addendum)
[facility] Office Phone Number [phone]  BREAST BIOPSY/ PARTIAL MASTECTOMY: POST OP INSTRUCTIONS Take 400 mg of ibuprofen every 8 hours or 650 mg tylenol every 6 hours for next 72 hours then as needed. Use ice several times daily also. Always review your discharge instruction sheet given to you by the facility where your surgery was performed.  IF YOU HAVE DISABILITY OR FAMILY LEAVE FORMS, YOU MUST BRING THEM TO THE OFFICE FOR PROCESSING.  DO NOT GIVE THEM TO YOUR DOCTOR.  A prescription for pain medication may be given to you upon discharge.  Take your pain medication as prescribed, if needed.  If narcotic pain medicine is not needed, then you may take acetaminophen (Tylenol), naprosyn (Alleve) or ibuprofen (Advil) as needed. Take your usually prescribed medications unless otherwise directed If you need a refill on your pain medication, please contact your pharmacy.  They will contact our office to request authorization.  Prescriptions will not be filled after 5pm or on week-ends. You should eat very light the first 24 hours after surgery, such as soup, crackers, pudding, etc.  Resume your normal diet the day after surgery. Most patients will experience some swelling and bruising in the breast.  Ice packs and a good support bra will help.  Wear the breast binder provided or a sports bra for 72 hours day and night.  After that wear a sports bra during the day until you return to the office. Swelling and bruising can take several days to resolve.  It is common to experience some constipation if taking pain medication after surgery.  Increasing fluid intake and taking a stool softener will usually help or prevent this problem from occurring.  A mild laxative (Milk of Magnesia or Miralax) should be taken according to package directions if there are no bowel movements after 48 hours. Unless discharge instructions indicate otherwise, you may remove your bandages 48 hours after surgery  and you may shower at that time.  You may have steri-strips (small skin tapes) in place directly over the incision.  These strips should be left on the skin for 7-10 days and will come off on their own.  If your surgeon used skin glue on the incision, you may shower in 24 hours.  The glue will flake off over the next 2-3 weeks.  Any sutures or staples will be removed at the office during your follow-up visit. ACTIVITIES:  You may resume regular daily activities (gradually increasing) beginning the next day.  Wearing a good support bra or sports bra minimizes pain and swelling.  You may have sexual intercourse when it is comfortable. You may drive when you no longer are taking prescription pain medication, you can comfortably wear a seatbelt, and you can safely maneuver your car and apply brakes. RETURN TO WORK:  ______________________________________________________________________________________ You should see your doctor in the office for a follow-up appointment approximately two weeks after your surgery.  Your doctor's nurse will typically make your follow-up appointment when she calls you with your pathology report.  Expect your pathology report 3-4 business days after your surgery.  You may call to check if you do not hear from us after three days. OTHER INSTRUCTIONS: _______________________________________________________________________________________________ _____________________________________________________________________________________________________________________________________ _____________________________________________________________________________________________________________________________________ _____________________________________________________________________________________________________________________________________  WHEN TO CALL DR [place]KEFIELD: Fever over 101.0 Nausea and/or vomiting. Extreme swelling or bruising. Continued bleeding from incision. Increased  pain, redness, or drainage from the incision.  The clinic staff is available to answer your questions during regular business hours.  Please don't hesitate to call   and ask to speak to one of the nurses for clinical concerns.  If you have a medical emergency, go to the nearest emergency room or call 911.  A surgeon from [facility] is always on call at the hospital.  For further questions, please visit [email or web address]  

## 2020-11-23 NOTE — Anesthesia Procedure Notes (Signed)
Anesthesia Regional Block: Pectoralis block   Pre-Anesthetic Checklist: , timeout performed,  Correct Patient, Correct Site, Correct Laterality,  Correct Procedure, Correct Position, site marked,  Risks and benefits discussed,  Surgical consent,  Pre-op evaluation,  At surgeon's request and post-op pain management  Laterality: Right  Prep: chloraprep       Needles:  Injection technique: Single-shot  Needle Type: Echogenic Stimulator Needle     Needle Length: 10cm  Needle Gauge: 20     Additional Needles:   Procedures:,,,, ultrasound used (permanent image in chart),,    Narrative:  Start time: 11/23/2020 6:55 AM End time: 11/23/2020 6:59 AM Injection made incrementally with aspirations every 5 mL.  Performed by: Personally  Anesthesiologist: Lidia Collum, MD  Additional Notes: Standard monitors applied. Skin prepped. Good needle visualization with ultrasound. Injection made in 5cc increments with no resistance to injection. Patient tolerated the procedure well.

## 2020-11-23 NOTE — H&P (Signed)
Brandy [name] is an 55 y.o. female.   Chief Complaint: breast cancer HPI:  73 yof referred by Dr Irene Limbo 2 years ago for likely stage IV breast cancer.  she went to ER with rlq pain.  she was evaluated there with ct scan that showed a 5 cm spiculated mass in the right breast and multiple small pulmonary nodules concerning for mets.  she has since undergone a mm/us that shows a 4 cm lobulated mass in the right breast.  there is one satellite nodule as well near that.  [place] of her axilla is negative.  two biopsies are done with finding of IDC that is er pos, pr neg, her 2 neg with ki on low end.  she has seen oncology and continues to undergo staging for presumed stage IV disease. she had a 1.4x1x0.8 cm nodule near apex of rul in 2021.  she has pet scan from 3/22 that shows 8 mm irregular nodule right lung that is non FDG avid.  presumed stage IV disease and treated by oncology. currently on verzenio and faslodex.  she had repeat mm 3/22 that shows the no real residual mass at tumor site but prominent architectural distortion, there are some suspicious microcalcs that measure 9 mm.  there is also right sided architectural distortion that has undergone biopsy and is concordant dense fibrosis (history of reduction).  she had [place] that shows no abnormal ax nodes. she has been referred back for surgical consideration   Past Medical History:  Diagnosis Date   Acute pansinusitis 08/02/2017   Anxiety    Arthritis    ASD (atrial septal defect)    s/p closure with Amplatzer device 10/05/04 (Dr. Myriam Jacobson, [facility]) 10/05/04   Cancer ([place])    Cataract    Dyspnea    GERD (gastroesophageal reflux disease)    Heart murmur    no longer heard   History of hiatal hernia    Legally blind in right eye, as defined in [place]    Lumbar herniated disc    PONV (postoperative nausea and vomiting)    Sciatica     Past Surgical History:  Procedure Laterality Date   ABLATION     BREAST SURGERY Bilateral 2011   Breast  Reduction Surgery   BUNIONECTOMY     CARDIAC CATHETERIZATION     10/05/04 [facility]): LM < 25%, otherwise normal coronaries. No pulmonary HTN, Mildly enlarged RV. Secundum ASD s/p closure.   CARDIAC SURGERY     CATARACT EXTRACTION     CLEFT PALATE REPAIR     s/p cleft lip and palate repair   EYE SURGERY Right 2019   right eye removed   LUMBAR LAMINECTOMY/DECOMPRESSION MICRODISCECTOMY Left 05/23/2016   Procedure: LEFT L4-L5 LATERAL RECESS DECOMPRESSION WITH CENTRAL AND RIGHT DECOMPRESSION VIA LEFT SIDE;  Surgeon: [name], MD;  Location: [place];  Service: Orthopedics;  Laterality: Left;   LUMBAR LAMINECTOMY/DECOMPRESSION MICRODISCECTOMY Left 05/23/2016   Procedure: LUMBAR LAMINECTOMY/DECOMPRESSION MICRODISCECTOMY Lumbar five - Sacral One 1 LEVEL;  Surgeon: [name], MD;  Location: [place];  Service: Orthopedics;  Laterality: Left;   REDUCTION MAMMAPLASTY  2011   SHOULDER INJECTION Left 05/23/2016   Procedure: SHOULDER INJECTION;  Surgeon: [name], MD;  Location: [place];  Service: Orthopedics;  Laterality: Left;  band-aid per pa-c   TRANSTHORACIC ECHOCARDIOGRAM     12/15/05 [facility]): Mild LVH, EF > 89%, grade 1 diastolic dysfunction, Trivial MR/PR/TR.   TUBAL LIGATION      Family  History  Problem Relation Age of Onset   Diabetes Mother    High blood pressure Mother    Cancer Father        Lung   Colon cancer Neg Hx    Colon polyps Neg Hx    Esophageal cancer Neg Hx    Rectal cancer Neg Hx    Stomach cancer Neg Hx    Social History:  reports that she quit smoking about 2 years ago. Her smoking use included cigarettes. She has a 6.25 pack-year smoking history. She has never used smokeless tobacco. She reports that she does not drink alcohol and does not use drugs.  Allergies:  Allergies  Allergen Reactions   Zithromax [Azithromycin] Shortness Of Breath and Itching    TOTAL BODY ITCHING [EVEN SOLES OF FEET] WHEEZING    Psyllium Nausea And Vomiting    Facility-Administered  Medications Prior to Admission  Medication Dose Route Frequency Provider Last Rate Last Admin   0.9 %  sodium chloride infusion  500 mL Intravenous Continuous Nandigam, Kavitha V, MD       Medications Prior to Admission  Medication Sig Dispense Refill   acetaminophen (TYLENOL) 500 MG tablet Take 500-1,000 mg by mouth every 6 (six) hours as needed for moderate pain or headache.     albuterol (PROVENTIL) (2.5 MG/3ML) 0.083% nebulizer solution Take 3 mLs (2.5 mg total) by nebulization every 6 (six) hours as needed for wheezing or shortness of breath. 75 mL 0   ALPRAZolam (XANAX) 1 MG tablet Take 1 mg by mouth at bedtime.     amitriptyline (ELAVIL) 75 MG tablet Take 75 mg by mouth at bedtime.      Carboxymethylcellul-Glycerin (LUBRICATING EYE DROPS OP) Place 1 drop into the left eye 4 (four) times daily.     Cyanocobalamin (B-12 PO) Take 1 tablet by mouth daily.     diphenoxylate-atropine (LOMOTIL) 2.5-0.025 MG tablet Take 1 tablet by mouth 3 (three) times daily as needed for diarrhea or loose stools. 30 tablet 0   loperamide (IMODIUM) 2 MG capsule Take 2-4 mg by mouth 4 (four) times daily as needed for diarrhea or loose stools.     Magnesium 500 MG TABS Take 1 tablet (500 mg total) by mouth in the morning and at bedtime. 60 tablet 1   Multiple Vitamin (MULTIVITAMIN WITH MINERALS) TABS tablet Take 1 tablet by mouth daily.     omeprazole (PRILOSEC OTC) 20 MG tablet Take 20 mg by mouth daily.     ondansetron (ZOFRAN) 8 MG tablet Take 1 tablet (8 mg total) by mouth every 8 (eight) hours as needed for nausea or vomiting. 30 tablet 3   Travoprost, BAK Free, (TRAVATAN) 0.004 % SOLN ophthalmic solution Place 1 drop into the left eye at bedtime.     VERZENIO 100 MG tablet TAKE 1 TABLET (100 MG) BY MOUTH TWICE A DAY. SWALLOW TABLETS WHOLE. DO NOT CHEW, CRUSH, OR SPLIT TABLETS 56 tablet 1   Vitamin D, Ergocalciferol, (DRISDOL) 1.25 MG (50000 UNIT) CAPS capsule TAKE 1 CAPSULE BY MOUTH 1 TIME A WEEK (Patient  taking differently: Take 50,000 Units by mouth every Monday.) 12 capsule 3   White Petrolatum-Mineral Oil (LUBRICANT EYE NIGHTTIME) OINT Place 1 drop into the left eye at bedtime.      No results found for this or any previous visit (from the past 48 hour(s)). No results found.  Review of Systems  All other systems reviewed and are negative.  Blood pressure (!) 173/75, pulse (!) 110,  temperature 97.7 F (36.5 C), temperature source Oral, resp. rate 20, height 5\' 7"  (1.702 m), weight 98 kg, SpO2 96 %. Physical Exam  General Mental Status - Alert. Orientation - Oriented X3. Breast Nipples - No Discharge. Breast Lump - No Palpable Breast Mass. Lymphatic Head & Neck General Head & Neck Lymphatics: Bilateral - Description - Normal. Axillary General Axillary Region: Bilateral - Description - Normal. Note:  no [place] adenopathy  Assessment/Plan Lumpectomy/sn biopsy STAGE IV BREAST CANCER IN FEMALE (C50.919)  she has presumed stage IV disease with lung nodule better on imaging and breast mass smaller it appears. Question now is whether to pursue local therapy. I discussed that with stage IV disease there in vast majority of studies and RCTs there is no real survival advantage. Some single center papers question this. I think overall thought is there is no advantage and about 10-20% of patients will progress and need local therapy. Right now she is better and I dont think that local therapy is necessary. however I still am concerned that she just has the one pulm nodule with nothing else, this is better, unable to get tissue. I would consider surgery with lumpectomy for her. I am going to put her on for our breast conference next week to discuss in multidisciplinary fashion and I am also going to reach out to [facility] as well.  After conference I am not convinced she has stage IV disease and think that proceeding with local therapy is reasonable  Rolm Bookbinder, MD 11/23/2020, 7:19 AM

## 2020-11-23 NOTE — Transfer of Care (Signed)
Immediate Anesthesia Transfer of Care Note  Patient: Brandy [name]  Procedure(s) Performed: RIGHT BREAST LUMPECTOMY WITH RADIOACTIVE SEED AND RIGHT AXILLARY SENTINEL LYMPH NODE BIOPSY (Right: Breast)  Patient Location: PACU  Anesthesia Type:General  Level of Consciousness: drowsy  Airway & Oxygen Therapy: Patient Spontanous Breathing and Patient connected to face mask oxygen  Post-op Assessment: Report given to RN and Post -op Vital signs reviewed and stable  Post vital signs: Reviewed and stable  Last Vitals:  Vitals Value Taken Time  BP 131/50 11/23/20 0907  Temp    Pulse 79 11/23/20 0908  Resp 10 11/23/20 0908  SpO2 100 % 11/23/20 0908  Vitals shown include unvalidated device data.  Last Pain:  Vitals:   11/23/20 0620  TempSrc:   PainSc: 0-No pain      Patients Stated Pain Goal: 5 (70/05/25 9102)  Complications: No notable events documented.

## 2020-11-24 ENCOUNTER — Encounter (HOSPITAL_COMMUNITY): Payer: Self-pay | Admitting: General Surgery

## 2020-11-25 LAB — SURGICAL PATHOLOGY

## 2020-11-27 ENCOUNTER — Other Ambulatory Visit: Payer: Self-pay

## 2020-11-27 ENCOUNTER — Inpatient Hospital Stay: Payer: 59 | Attending: Hematology

## 2020-11-27 ENCOUNTER — Inpatient Hospital Stay: Payer: 59

## 2020-11-27 ENCOUNTER — Ambulatory Visit: Payer: 59 | Admitting: Hematology

## 2020-11-27 ENCOUNTER — Ambulatory Visit: Payer: 59

## 2020-11-27 VITALS — BP 116/73 | HR 92 | Temp 98.5°F | Resp 16

## 2020-11-27 DIAGNOSIS — Z5111 Encounter for antineoplastic chemotherapy: Secondary | ICD-10-CM | POA: Insufficient documentation

## 2020-11-27 DIAGNOSIS — Z79899 Other long term (current) drug therapy: Secondary | ICD-10-CM | POA: Diagnosis not present

## 2020-11-27 DIAGNOSIS — R911 Solitary pulmonary nodule: Secondary | ICD-10-CM | POA: Diagnosis not present

## 2020-11-27 DIAGNOSIS — Z8249 Family history of ischemic heart disease and other diseases of the circulatory system: Secondary | ICD-10-CM | POA: Diagnosis not present

## 2020-11-27 DIAGNOSIS — C50811 Malignant neoplasm of overlapping sites of right female breast: Secondary | ICD-10-CM | POA: Diagnosis present

## 2020-11-27 DIAGNOSIS — Z87891 Personal history of nicotine dependence: Secondary | ICD-10-CM | POA: Insufficient documentation

## 2020-11-27 DIAGNOSIS — Z833 Family history of diabetes mellitus: Secondary | ICD-10-CM | POA: Diagnosis not present

## 2020-11-27 DIAGNOSIS — Z170 Estrogen receptor positive status [ER+]: Secondary | ICD-10-CM | POA: Insufficient documentation

## 2020-11-27 DIAGNOSIS — C50919 Malignant neoplasm of unspecified site of unspecified female breast: Secondary | ICD-10-CM

## 2020-11-27 DIAGNOSIS — Z801 Family history of malignant neoplasm of trachea, bronchus and lung: Secondary | ICD-10-CM | POA: Insufficient documentation

## 2020-11-27 LAB — CBC WITH DIFFERENTIAL/PLATELET
Abs Immature Granulocytes: 0.01 10*3/uL (ref 0.00–0.07)
Basophils Absolute: 0 10*3/uL (ref 0.0–0.1)
Basophils Relative: 1 %
Eosinophils Absolute: 0 10*3/uL (ref 0.0–0.5)
Eosinophils Relative: 1 %
HCT: 31 % — ABNORMAL LOW (ref 36.0–46.0)
Hemoglobin: 10.6 g/dL — ABNORMAL LOW (ref 12.0–15.0)
Immature Granulocytes: 0 %
Lymphocytes Relative: 51 %
Lymphs Abs: 3.2 10*3/uL (ref 0.7–4.0)
MCH: 35.2 pg — ABNORMAL HIGH (ref 26.0–34.0)
MCHC: 34.2 g/dL (ref 30.0–36.0)
MCV: 103 fL — ABNORMAL HIGH (ref 80.0–100.0)
Monocytes Absolute: 0.3 10*3/uL (ref 0.1–1.0)
Monocytes Relative: 5 %
Neutro Abs: 2.6 10*3/uL (ref 1.7–7.7)
Neutrophils Relative %: 42 %
Platelets: 264 10*3/uL (ref 150–400)
RBC: 3.01 MIL/uL — ABNORMAL LOW (ref 3.87–5.11)
RDW: 14.6 % (ref 11.5–15.5)
WBC: 6.2 10*3/uL (ref 4.0–10.5)
nRBC: 0 % (ref 0.0–0.2)

## 2020-11-27 LAB — CMP (CANCER CENTER ONLY)
ALT: 44 U/L (ref 0–44)
AST: 42 U/L — ABNORMAL HIGH (ref 15–41)
Albumin: 3.2 g/dL — ABNORMAL LOW (ref 3.5–5.0)
Alkaline Phosphatase: 152 U/L — ABNORMAL HIGH (ref 38–126)
Anion gap: 12 (ref 5–15)
BUN: 14 mg/dL (ref 6–20)
CO2: 27 mmol/L (ref 22–32)
Calcium: 8.9 mg/dL (ref 8.9–10.3)
Chloride: 102 mmol/L (ref 98–111)
Creatinine: 1.22 mg/dL — ABNORMAL HIGH (ref 0.44–1.00)
GFR, Estimated: 52 mL/min — ABNORMAL LOW (ref >60)
Glucose, Bld: 93 mg/dL (ref 70–99)
Potassium: 4.3 mmol/L (ref 3.5–5.1)
Sodium: 141 mmol/L (ref 135–145)
Total Bilirubin: 0.5 mg/dL (ref 0.3–1.2)
Total Protein: 7.4 g/dL (ref 6.5–8.1)

## 2020-11-27 MED ORDER — FULVESTRANT 250 MG/5ML IM SOLN
500.0000 mg | Freq: Once | INTRAMUSCULAR | Status: AC
  Administered 2020-11-27: 500 mg via INTRAMUSCULAR

## 2020-11-27 MED ORDER — FULVESTRANT 250 MG/5ML IM SOLN
INTRAMUSCULAR | Status: AC
  Filled 2020-11-27: qty 10

## 2020-11-27 NOTE — Patient Instructions (Signed)
Fulvestrant injection What is this medication? FULVESTRANT (ful VES trant) blocks the effects of estrogen. It is used to treat breast cancer. This medicine may be used for other purposes; ask your health care provider or pharmacist if you have questions. COMMON BRAND NAME(S): FASLODEX What should I tell my care team before I take this medication? They need to know if you have any of these conditions: bleeding disorders liver disease low blood counts, like low white cell, platelet, or red cell counts an unusual or allergic reaction to fulvestrant, other medicines, foods, dyes, or preservatives pregnant or trying to get pregnant breast-feeding How should I use this medication? This medicine is for injection into a muscle. It is usually given by a health care professional in a hospital or clinic setting. Talk to your pediatrician regarding the use of this medicine in children. Special care may be needed. Overdosage: If you think you have taken too much of this medicine contact a poison control center or emergency room at once. NOTE: This medicine is only for you. Do not share this medicine with others. What if I miss a dose? It is important not to miss your dose. Call your doctor or health care professional if you are unable to keep an appointment. What may interact with this medication? medicines that treat or prevent blood clots like warfarin, enoxaparin, dalteparin, apixaban, dabigatran, and rivaroxaban This list may not describe all possible interactions. Give your health care provider a list of all the medicines, herbs, non-prescription drugs, or dietary supplements you use. Also tell them if you smoke, drink alcohol, or use illegal drugs. Some items may interact with your medicine. What should I watch for while using this medication? Your condition will be monitored carefully while you are receiving this medicine. You will need important blood work done while you are taking this  medicine. Do not become pregnant while taking this medicine or for at least 1 year after stopping it. Women of child-bearing potential will need to have a negative pregnancy test before starting this medicine. Women should inform their doctor if they wish to become pregnant or think they might be pregnant. There is a potential for serious side effects to an unborn child. Men should inform their doctors if they wish to father a child. This medicine may lower sperm counts. Talk to your health care professional or pharmacist for more information. Do not breast-feed an infant while taking this medicine or for 1 year after the last dose. What side effects may I notice from receiving this medication? Side effects that you should report to your doctor or health care professional as soon as possible: allergic reactions like skin rash, itching or hives, swelling of the face, lips, or tongue feeling faint or lightheaded, falls pain, tingling, numbness, or weakness in the legs signs and symptoms of infection like fever or chills; cough; flu-like symptoms; sore throat vaginal bleeding Side effects that usually do not require medical attention (report to your doctor or health care professional if they continue or are bothersome): aches, pains constipation diarrhea headache hot flashes nausea, vomiting pain at site where injected stomach pain This list may not describe all possible side effects. Call your doctor for medical advice about side effects. You may report side effects to FDA at 1-800-FDA-1088. Where should I keep my medication? This drug is given in a hospital or clinic and will not be stored at home. NOTE: This sheet is a summary. It may not cover all possible information. If you have   questions about this medicine, talk to your doctor, pharmacist, or health care provider.  2022 Elsevier/Gold Standard (2017-08-31 11:34:41)  

## 2020-12-06 ENCOUNTER — Encounter: Payer: Self-pay | Admitting: Hematology

## 2020-12-06 ENCOUNTER — Emergency Department (HOSPITAL_COMMUNITY)
Admission: EM | Admit: 2020-12-06 | Discharge: 2020-12-06 | Disposition: A | Discharge status: Home / Self Care | Payer: 59 | Attending: Emergency Medicine | Admitting: Emergency Medicine

## 2020-12-06 DIAGNOSIS — N644 Mastodynia: Secondary | ICD-10-CM | POA: Diagnosis not present

## 2020-12-06 DIAGNOSIS — Z5321 Procedure and treatment not carried out due to patient leaving prior to being seen by health care provider: Secondary | ICD-10-CM | POA: Diagnosis not present

## 2020-12-06 DIAGNOSIS — M79621 Pain in right upper arm: Secondary | ICD-10-CM | POA: Insufficient documentation

## 2020-12-06 NOTE — ED Notes (Signed)
Pt states she's leaving at this time and going to her doctors office.

## 2020-12-24 ENCOUNTER — Other Ambulatory Visit: Payer: Self-pay | Admitting: Hematology

## 2020-12-24 NOTE — Progress Notes (Signed)
HEMATOLOGY/ONCOLOGY CLINIC NOTE  Date of Service: 12/24/2020  Patient Care Team: [facility] as PCP - General Everardo Beals, NP as Nurse Practitioner [facility] CHIEF COMPLAINTS/PURPOSE OF CONSULTATION:  -f/u for metastatic breast cancer   HISTORY OF PRESENTING ILLNESS:  Brandy [name] is a wonderful 55 y.o. female who has been referred to [place] by [facility] for evaluation and management of her suspected breast cancer.   The pt reports that she was having increasing low back pain on her right side that would not resolve, so she went to the ED. This pain has not resolved. She notes pain in her hips and knees.   Of note prior to the patient's visit today, pt has had an abdominal limited right upper ultrasound completed on 02/18/2019 with results revealing "Probable hepatic steatosis. No other abnormality seen in the right upper quadrant of the abdomen."  Pt had abdomen and pelvis CT completed on 02/18/2019 with results revealing "5cm spiculated soft tissue mass in inferior right breast, highly suspicious for primary breast carcinoma. No acute findings or metastatic disease within the abdomen or pelvis. Multiple small pulmonary nodules in both lung bases, consistent with pulmonary metastases. 4.5 cm uterine fibroid."  Most recent lab results (02/18/2019) of CBC is as follows: all values are WNL except for glucose at 121, calcium at 8.7, AST at 49, and ALT at 53.  On review of systems, pt reports pedal edema and denies chest pain, shortness of breath, weight loss, and any other symptoms.  Her last known mammography was on 06/18/2012.   On Social Hx the pt reports that she is a smoker and she smokes about half a pack per day. She does not drink alcohol. She has three children: age 23, 51, and 13.  On Family Hx the pt reports lung cancer. Maternal Cousin passed from stomach cancer at the age of 34.    INTERVAL HISTORY:  Brandy [name] is a 55 y.o. female here for evaluation and management of metastatic breast cancer. The patient's last visit with [place] was on 10/30/2020. The pt reports that she is doing well overall.  The pt reports surgery went well. Pathology did not show any residual invasive carcinoma. Breathing stable. No reported toxicities from faslodex or verzenio. Diarrhea improved.  Lab results today 12/25/2020 of CBC w/diff and CMP reviewed with patient.  On review of systems, pt reports she is feeling more positive overall.   MEDICAL HISTORY:  Past Medical History:  Diagnosis Date   Acute pansinusitis 08/02/2017   Anxiety    Arthritis    ASD (atrial septal defect)    s/p closure with Amplatzer device 10/05/04 (Dr. Myriam Jacobson, [place]) 10/05/04   Cancer ([place])    Cataract    Dyspnea    GERD (gastroesophageal reflux disease)    Heart murmur    no longer heard   History of hiatal hernia    Legally blind in right eye, as defined in [place]    Lumbar herniated disc    PONV (postoperative nausea and vomiting)    Sciatica   Uterine Fibroids  SURGICAL HISTORY: Past Surgical History:  Procedure Laterality Date   ABLATION     BREAST LUMPECTOMY WITH RADIOACTIVE SEED AND SENTINEL LYMPH NODE BIOPSY Right 11/23/2020   Procedure: RIGHT BREAST LUMPECTOMY WITH RADIOACTIVE SEED AND RIGHT AXILLARY SENTINEL LYMPH NODE BIOPSY;  Surgeon: Rolm Bookbinder, MD;  Location: [place];  Service: General;  Laterality: Right;   BREAST  SURGERY Bilateral 2011   Breast Reduction Surgery   BUNIONECTOMY     CARDIAC CATHETERIZATION     10/05/04 [facility]): LM < 25%, otherwise normal coronaries. No pulmonary HTN, Mildly enlarged RV. Secundum ASD s/p closure.   CARDIAC SURGERY     CATARACT EXTRACTION     CLEFT PALATE REPAIR     s/p cleft lip and palate repair   EYE SURGERY Right 2019   right eye removed   LUMBAR LAMINECTOMY/DECOMPRESSION MICRODISCECTOMY Left 05/23/2016   Procedure: LEFT L4-L5 LATERAL RECESS DECOMPRESSION  WITH CENTRAL AND RIGHT DECOMPRESSION VIA LEFT SIDE;  Surgeon: [name], MD;  Location: [place];  Service: Orthopedics;  Laterality: Left;   LUMBAR LAMINECTOMY/DECOMPRESSION MICRODISCECTOMY Left 05/23/2016   Procedure: LUMBAR LAMINECTOMY/DECOMPRESSION MICRODISCECTOMY Lumbar five - Sacral One 1 LEVEL;  Surgeon: [name], MD;  Location: [place];  Service: Orthopedics;  Laterality: Left;   REDUCTION MAMMAPLASTY  2011   SHOULDER INJECTION Left 05/23/2016   Procedure: SHOULDER INJECTION;  Surgeon: [name], MD;  Location: [place];  Service: Orthopedics;  Laterality: Left;  band-aid per pa-c   TRANSTHORACIC ECHOCARDIOGRAM     12/15/05 [facility]): Mild LVH, EF > 37%, grade 1 diastolic dysfunction, Trivial MR/PR/TR.   TUBAL LIGATION    Endometrial ablation 2003 Breast Reduction, bilateral 2011  SOCIAL HISTORY: Social History   Socioeconomic History   Marital status: Married    Spouse name: Not on file   Number of children: 3   Years of education: Not on file   Highest education level: Not on file  Occupational History   Not on file  Tobacco Use   Smoking status: Former    Packs/day: 0.25    Years: 25.00    Pack years: 6.25    Types: Cigarettes    Quit date: 02/04/2018    Years since quitting: 2.8   Smokeless tobacco: Never  Vaping Use   Vaping Use: Never used  Substance and Sexual Activity   Alcohol use: No   Drug use: No   Sexual activity: Not on file  Other Topics Concern   Not on file  Social History Narrative   Not on file   Social Determinants of Health   Financial Resource Strain: Not on file  Food Insecurity: Not on file  Transportation Needs: Not on file  Physical Activity: Not on file  Stress: Not on file  Social Connections: Not on file  Intimate Partner Violence: Not on file    FAMILY HISTORY: Family History  Problem Relation Age of Onset   Diabetes Mother    High blood pressure Mother    Cancer Father        Lung   Colon cancer Neg Hx    Colon polyps  Neg Hx    Esophageal cancer Neg Hx    Rectal cancer Neg Hx    Stomach cancer Neg Hx     ALLERGIES:  is allergic to zithromax [azithromycin] and psyllium.  MEDICATIONS:  Current Outpatient Medications  Medication Sig Dispense Refill   acetaminophen (TYLENOL) 500 MG tablet Take 500-1,000 mg by mouth every 6 (six) hours as needed for moderate pain or headache.     albuterol (PROVENTIL) (2.5 MG/3ML) 0.083% nebulizer solution Take 3 mLs (2.5 mg total) by nebulization every 6 (six) hours as needed for wheezing or shortness of breath. 75 mL 0   ALPRAZolam (XANAX) 1 MG tablet Take 1 mg by mouth at bedtime.     amitriptyline (ELAVIL) 75 MG tablet Take  75 mg by mouth at bedtime.      Carboxymethylcellul-Glycerin (LUBRICATING EYE DROPS OP) Place 1 drop into the left eye 4 (four) times daily.     Cyanocobalamin (B-12 PO) Take 1 tablet by mouth daily.     diphenoxylate-atropine (LOMOTIL) 2.5-0.025 MG tablet Take 1 tablet by mouth 3 (three) times daily as needed for diarrhea or loose stools. 30 tablet 0   HYDROcodone-acetaminophen (NORCO/VICODIN) 5-325 MG tablet Take 1 tablet by mouth every 4 (four) hours as needed for moderate pain or severe pain. 10 tablet 0   loperamide (IMODIUM) 2 MG capsule Take 2-4 mg by mouth 4 (four) times daily as needed for diarrhea or loose stools.     Magnesium 500 MG TABS Take 1 tablet (500 mg total) by mouth in the morning and at bedtime. 60 tablet 1   Multiple Vitamin (MULTIVITAMIN WITH MINERALS) TABS tablet Take 1 tablet by mouth daily.     omeprazole (PRILOSEC OTC) 20 MG tablet Take 20 mg by mouth daily.     ondansetron (ZOFRAN) 8 MG tablet TAKE 1 TABLET(8 MG) BY MOUTH EVERY 8 HOURS AS NEEDED FOR NAUSEA OR VOMITING 30 tablet 3   Travoprost, BAK Free, (TRAVATAN) 0.004 % SOLN ophthalmic solution Place 1 drop into the left eye at bedtime.     VERZENIO 100 MG tablet TAKE 1 TABLET (100 MG) BY MOUTH TWICE A DAY. SWALLOW TABLETS WHOLE. DO NOT CHEW, CRUSH, OR SPLIT TABLETS 56  tablet 1   Vitamin D, Ergocalciferol, (DRISDOL) 1.25 MG (50000 UNIT) CAPS capsule TAKE 1 CAPSULE BY MOUTH 1 TIME A WEEK (Patient taking differently: Take 50,000 Units by mouth every Monday.) 12 capsule 3   White Petrolatum-Mineral Oil (LUBRICANT EYE NIGHTTIME) OINT Place 1 drop into the left eye at bedtime.     No current facility-administered medications for this visit.    REVIEW OF SYSTEMS:   10 Point review of Systems was done is negative except as noted above.  PHYSICAL EXAMINATION: ECOG FS:1 - Symptomatic but completely ambulatory  There were no vitals filed for this visit.  Wt Readings from Last 3 Encounters:  11/23/20 216 lb (98 kg)  10/30/20 219 lb 8 oz (99.6 kg)  09/04/20 222 lb 8 oz (100.9 kg)   There is no height or weight on file to calculate BMI.     NAD GENERAL:alert, in no acute distress and comfortable SKIN: no acute rashes, no significant lesions EYES: conjunctiva are pink and non-injected, sclera anicteric OROPHARYNX: MMM, no exudates, no oropharyngeal erythema or ulceration NECK: supple, no JVD LYMPH:  no palpable lymphadenopathy in the cervical, axillary or inguinal regions LUNGS: clear to auscultation b/l with normal respiratory effort HEART: regular rate & rhythm ABDOMEN:  normoactive bowel sounds , non tender, not distended. Extremity: no pedal edema PSYCH: alert & oriented x 3 with fluent speech NEURO: no focal motor/sensory deficits   LABORATORY DATA:  I have reviewed the data as listed   CBC Latest Ref Rng & Units 12/25/2020 11/27/2020 10/30/2020  WBC 4.0 - 10.5 K/uL 4.5 6.2 4.3  Hemoglobin 12.0 - 15.0 g/dL 10.3(L) 10.6(L) 10.9(L)  Hematocrit 36.0 - 46.0 % 30.3(L) 31.0(L) 32.4(L)  Platelets 150 - 400 K/uL 271 264 250     CMP Latest Ref Rng & Units 12/25/2020 11/27/2020 10/30/2020  Glucose 70 - 99 mg/dL 130(H) 93 98  BUN 6 - 20 mg/dL _0 Creatinine 0.44 - 1.00 mg/dL 1.02(H) 1.22(H) 1.30(H)  Sodium 135 - 145 mmol/L 143 141 142  Potassium  3.5 - 5.1 mmol/L 3.4(L) 4.3 4.0  Chloride 98 - 111 mmol/L 106 102 105  CO2 22 - 32 mmol/L _0 Calcium 8.9 - 10.3 mg/dL 9.2 8.9 9.1  Total Protein 6.5 - 8.1 g/dL 7.2 7.4 7.1  Total Bilirubin 0.3 - 1.2 mg/dL 0.6 0.5 0.5  Alkaline Phos 38 - 126 U/L 159(H) 152(H) 127(H)  AST 15 - 41 U/L 100(H) 42(H) 64(H)  ALT 0 - 44 U/L 99(H) 44 56(H)   Component     Latest Ref Rng & Units 03/08/2019 03/22/2019  FSH     mIU/mL 65.5   LH     mIU/mL 46.6   Estrogen     pg/mL 82   Progesterone     ng/mL <0.1   Estradiol     pg/mL  <5.0        RADIOGRAPHIC STUDIES: I have personally reviewed the radiological images as listed and agreed with the findings in the report. No results found. 09/02/2020 Surgical Pathology    ASSESSMENT & PLAN:   Brandy [name] is a 55 y.o. female with:  1. Metastatic breast cancer ER+/PRneg/Her2 neg  02/28/2019 neck CT with results revealing "negative for mass or adenopathy in the neck."  03/04/2019 head MRI with results revealing "Negative for metastatic disease.  No acute abnormality in the brain."  2. Likely Pulmonary metastases  02/18/2019 chest and abdomen with results revealing "5cm spiculated soft tissue mass in inferior right breast, highly suspicious for primary breast carcinoma. No acute findings or metastatic disease within the abdomen or pelvis. Multiple small pulmonary nodules in both lung bases, consistent with pulmonary metastases. 4.5 cm uterine fibroid."  02/28/2019 C/A/P CT with results revealing "Irregular solid 5.0 cm right breast mass, suspicious for primary right breast malignancy. Innumerable solid pulmonary nodules scattered throughout both lungs, compatible with pulmonary metastases. No evidence of metastatic disease in the abdomen, pelvis or skeleton. Mildly enlarged and probably myomatous uterus. Simple 1.4 cm left adnexal cyst requires no follow-up. This recommendation follows ACR consensus guidelines: White Paper of the ACR  Incidental Findings Committee II on Adnexal Findings. J Am Coll Radiol [phone]. Aortic Atherosclerosis (ICD10-I70.0)."  NUCLEAR MEDICINE WHOLE BODY BONE SCAN completed on 03/19/2019 with results revealing "1. No scintigraphic evidence skeletal metastasis. 2. Degenerative bone disease in the posterior elements of the upper and mid lumbar spine."   04/09/2019 Bone Density ([id number]) which revealed "The BMD measured at Femur Neck Right is 1.153 g/cm2 with a T-score of 0.8. This patient is considered normal according to [facility]) criteria. Lumbar spine was not utilized due to advanced degenerative changes. The scan quality is good. Femur Neck Right 04/09/2019 54.7 Normal 0.8 1.153 g/cm2. Left Forearm Radius 33% 04/09/2019 54.7 Normal 0.8 0.949 g/cm2."  08/04/2019 CT Angio Chest ([id number]) revealed "1. No lobar or central pulmonary embolus detected. Exam is limited secondary to respiratory motion. 2. Mild ground-glass attenuation may represent mild pneumonitis or areas of air trapping. 3. Signs of atrial septal closure. 4. Decrease in size and number of bilateral pulmonary nodules, marked response noted on today's exam with the only nodule remaining near a cm in the right upper lobe and the smaller nodules that were present on the previous examination throughout the chest no longer measurable though the lower lobes are limited by respiratory motion. 5. Decreased size of right breast mass. 6. Probable hepatic steatosis."  3. Abnormal Liver function test -- previous? Fatty liver 07/03/2019 [place] Abd ([id number]) revealed "1. No acute  findings. Normal gallbladder. No bile duct dilation. 2. Significant increased liver parenchymal echogenicity consistent with extensive hepatic steatosis."  PLAN: -Discussed pt labwork today, 12/25/2020; labs stable. S/p rt breast lumpectomy on 6/20 -- no residual carcinoma on pathology. -no clinical or lab findings suggestive of breast cancer  progression at this time. --Recommended pt continue to stay active and walk around 20-30 min daily. -Recommended pt de-stress and eat healthy. Continue with a healthy weight loss regiment. -Will continue Faslodex at 576m q4weeks. -Continue 100 mg Verzenio BID. -Continue Vitamin D. -Continue Prilosec BID.    FOLLOW UP: Please schedule next 3 cycles of every 4 weekly Faslodex with labs. MD visit in 8 weeks   [place] referral   The total time spent in the appointment was 30 minutes and more than 50% was on counseling and direct patient cares.    All of the patient's questions were answered with apparent satisfaction. The patient knows to call the clinic with any problems, questions or concerns.  GSullivan LoneMD MBig SpringAAHIVMS SNovamed Surgery Center Of NashuaCLittle Hill Alina LodgeHematology/Oncology Physician CPrinceton Endoscopy [facility] LLC (Office):       [phone](Work cell):  [phone](Fax):           [phone] 12/24/2020 8:37 PM  I, RReinaldo Raddle am acting as scribe for Dr. GSullivan Lone MD.   .I have reviewed the above documentation for accuracy and completeness, and I agree with the above. .Brunetta GeneraMD

## 2020-12-25 ENCOUNTER — Inpatient Hospital Stay: Payer: 59

## 2020-12-25 ENCOUNTER — Inpatient Hospital Stay (HOSPITAL_BASED_OUTPATIENT_CLINIC_OR_DEPARTMENT_OTHER): Payer: 59 | Admitting: Hematology

## 2020-12-25 ENCOUNTER — Other Ambulatory Visit: Payer: Self-pay

## 2020-12-25 ENCOUNTER — Inpatient Hospital Stay: Payer: 59 | Attending: Hematology

## 2020-12-25 VITALS — BP 143/78 | HR 87 | Temp 98.5°F | Resp 18 | Wt 219.6 lb

## 2020-12-25 DIAGNOSIS — I700 Atherosclerosis of aorta: Secondary | ICD-10-CM | POA: Diagnosis not present

## 2020-12-25 DIAGNOSIS — F17210 Nicotine dependence, cigarettes, uncomplicated: Secondary | ICD-10-CM | POA: Insufficient documentation

## 2020-12-25 DIAGNOSIS — Z801 Family history of malignant neoplasm of trachea, bronchus and lung: Secondary | ICD-10-CM | POA: Diagnosis not present

## 2020-12-25 DIAGNOSIS — Z833 Family history of diabetes mellitus: Secondary | ICD-10-CM | POA: Diagnosis not present

## 2020-12-25 DIAGNOSIS — C50919 Malignant neoplasm of unspecified site of unspecified female breast: Secondary | ICD-10-CM

## 2020-12-25 DIAGNOSIS — C50811 Malignant neoplasm of overlapping sites of right female breast: Secondary | ICD-10-CM | POA: Diagnosis present

## 2020-12-25 DIAGNOSIS — R911 Solitary pulmonary nodule: Secondary | ICD-10-CM | POA: Insufficient documentation

## 2020-12-25 DIAGNOSIS — Z8249 Family history of ischemic heart disease and other diseases of the circulatory system: Secondary | ICD-10-CM | POA: Diagnosis not present

## 2020-12-25 DIAGNOSIS — Z5111 Encounter for antineoplastic chemotherapy: Secondary | ICD-10-CM | POA: Insufficient documentation

## 2020-12-25 DIAGNOSIS — Z79899 Other long term (current) drug therapy: Secondary | ICD-10-CM | POA: Diagnosis not present

## 2020-12-25 DIAGNOSIS — Z170 Estrogen receptor positive status [ER+]: Secondary | ICD-10-CM | POA: Insufficient documentation

## 2020-12-25 DIAGNOSIS — R7989 Other specified abnormal findings of blood chemistry: Secondary | ICD-10-CM | POA: Insufficient documentation

## 2020-12-25 LAB — CBC WITH DIFFERENTIAL/PLATELET
Abs Immature Granulocytes: 0 10*3/uL (ref 0.00–0.07)
Basophils Absolute: 0 10*3/uL (ref 0.0–0.1)
Basophils Relative: 1 %
Eosinophils Absolute: 0.1 10*3/uL (ref 0.0–0.5)
Eosinophils Relative: 2 %
HCT: 30.3 % — ABNORMAL LOW (ref 36.0–46.0)
Hemoglobin: 10.3 g/dL — ABNORMAL LOW (ref 12.0–15.0)
Immature Granulocytes: 0 %
Lymphocytes Relative: 55 %
Lymphs Abs: 2.5 10*3/uL (ref 0.7–4.0)
MCH: 34.8 pg — ABNORMAL HIGH (ref 26.0–34.0)
MCHC: 34 g/dL (ref 30.0–36.0)
MCV: 102.4 fL — ABNORMAL HIGH (ref 80.0–100.0)
Monocytes Absolute: 0.3 10*3/uL (ref 0.1–1.0)
Monocytes Relative: 7 %
Neutro Abs: 1.6 10*3/uL — ABNORMAL LOW (ref 1.7–7.7)
Neutrophils Relative %: 35 %
Platelets: 271 10*3/uL (ref 150–400)
RBC: 2.96 MIL/uL — ABNORMAL LOW (ref 3.87–5.11)
RDW: 14 % (ref 11.5–15.5)
WBC: 4.5 10*3/uL (ref 4.0–10.5)
nRBC: 0 % (ref 0.0–0.2)

## 2020-12-25 LAB — CMP (CANCER CENTER ONLY)
ALT: 99 U/L — ABNORMAL HIGH (ref 0–44)
AST: 100 U/L — ABNORMAL HIGH (ref 15–41)
Albumin: 3.2 g/dL — ABNORMAL LOW (ref 3.5–5.0)
Alkaline Phosphatase: 159 U/L — ABNORMAL HIGH (ref 38–126)
Anion gap: 11 (ref 5–15)
BUN: 8 mg/dL (ref 6–20)
CO2: 26 mmol/L (ref 22–32)
Calcium: 9.2 mg/dL (ref 8.9–10.3)
Chloride: 106 mmol/L (ref 98–111)
Creatinine: 1.02 mg/dL — ABNORMAL HIGH (ref 0.44–1.00)
GFR, Estimated: >60 mL/min (ref >60)
Glucose, Bld: 130 mg/dL — ABNORMAL HIGH (ref 70–99)
Potassium: 3.4 mmol/L — ABNORMAL LOW (ref 3.5–5.1)
Sodium: 143 mmol/L (ref 135–145)
Total Bilirubin: 0.6 mg/dL (ref 0.3–1.2)
Total Protein: 7.2 g/dL (ref 6.5–8.1)

## 2020-12-25 MED ORDER — FULVESTRANT 250 MG/5ML IM SOLN
INTRAMUSCULAR | Status: AC
  Filled 2020-12-25: qty 10

## 2020-12-25 MED ORDER — SACCHAROMYCES BOULARDII 400 MG PO CAPS
1.0000 | ORAL_CAPSULE | Freq: Two times a day (BID) | ORAL | 0 refills | Status: DC
Start: 2020-12-25 — End: 2021-05-28

## 2020-12-25 MED ORDER — FULVESTRANT 250 MG/5ML IM SOLN
500.0000 mg | Freq: Once | INTRAMUSCULAR | Status: AC
  Administered 2020-12-25: 500 mg via INTRAMUSCULAR

## 2020-12-25 MED ORDER — CLINDAMYCIN PHOSPHATE 1 % EX GEL: Freq: Two times a day (BID) | CUTANEOUS | 2 refills | Status: DC

## 2020-12-25 NOTE — Patient Instructions (Signed)
Fulvestrant injection What is this medication? FULVESTRANT (ful VES trant) blocks the effects of estrogen. It is used to treat breast cancer. This medicine may be used for other purposes; ask your health care provider or pharmacist if you have questions. COMMON BRAND NAME(S): FASLODEX What should I tell my care team before I take this medication? They need to know if you have any of these conditions: bleeding disorders liver disease low blood counts, like low white cell, platelet, or red cell counts an unusual or allergic reaction to fulvestrant, other medicines, foods, dyes, or preservatives pregnant or trying to get pregnant breast-feeding How should I use this medication? This medicine is for injection into a muscle. It is usually given by a health care professional in a hospital or clinic setting. Talk to your pediatrician regarding the use of this medicine in children. Special care may be needed. Overdosage: If you think you have taken too much of this medicine contact a poison control center or emergency room at once. NOTE: This medicine is only for you. Do not share this medicine with others. What if I miss a dose? It is important not to miss your dose. Call your doctor or health care professional if you are unable to keep an appointment. What may interact with this medication? medicines that treat or prevent blood clots like warfarin, enoxaparin, dalteparin, apixaban, dabigatran, and rivaroxaban This list may not describe all possible interactions. Give your health care provider a list of all the medicines, herbs, non-prescription drugs, or dietary supplements you use. Also tell them if you smoke, drink alcohol, or use illegal drugs. Some items may interact with your medicine. What should I watch for while using this medication? Your condition will be monitored carefully while you are receiving this medicine. You will need important blood work done while you are taking this  medicine. Do not become pregnant while taking this medicine or for at least 1 year after stopping it. Women of child-bearing potential will need to have a negative pregnancy test before starting this medicine. Women should inform their doctor if they wish to become pregnant or think they might be pregnant. There is a potential for serious side effects to an unborn child. Men should inform their doctors if they wish to father a child. This medicine may lower sperm counts. Talk to your health care professional or pharmacist for more information. Do not breast-feed an infant while taking this medicine or for 1 year after the last dose. What side effects may I notice from receiving this medication? Side effects that you should report to your doctor or health care professional as soon as possible: allergic reactions like skin rash, itching or hives, swelling of the face, lips, or tongue feeling faint or lightheaded, falls pain, tingling, numbness, or weakness in the legs signs and symptoms of infection like fever or chills; cough; flu-like symptoms; sore throat vaginal bleeding Side effects that usually do not require medical attention (report to your doctor or health care professional if they continue or are bothersome): aches, pains constipation diarrhea headache hot flashes nausea, vomiting pain at site where injected stomach pain This list may not describe all possible side effects. Call your doctor for medical advice about side effects. You may report side effects to FDA at 1-800-FDA-1088. Where should I keep my medication? This drug is given in a hospital or clinic and will not be stored at home. NOTE: This sheet is a summary. It may not cover all possible information. If you have   questions about this medicine, talk to your doctor, pharmacist, or health care provider.  2022 Elsevier/Gold Standard (2017-08-31 11:34:41)  

## 2020-12-28 ENCOUNTER — Telehealth: Payer: Self-pay | Admitting: Hematology

## 2020-12-28 NOTE — Telephone Encounter (Signed)
Scheduled follow-up appointments per 7/22 los. Patient is aware.

## 2020-12-31 ENCOUNTER — Encounter: Payer: Self-pay | Admitting: Hematology

## 2020-12-31 ENCOUNTER — Other Ambulatory Visit: Payer: Self-pay | Admitting: Hematology

## 2021-01-04 ENCOUNTER — Other Ambulatory Visit: Payer: Self-pay | Admitting: Hematology

## 2021-01-18 ENCOUNTER — Other Ambulatory Visit: Payer: Self-pay

## 2021-01-18 DIAGNOSIS — C50919 Malignant neoplasm of unspecified site of unspecified female breast: Secondary | ICD-10-CM

## 2021-01-18 MED ORDER — VITAMIN D (ERGOCALCIFEROL) 1.25 MG (50000 UNIT) PO CAPS
ORAL_CAPSULE | ORAL | 3 refills | Status: DC
Start: 2021-01-18 — End: 2021-04-19

## 2021-01-18 MED ORDER — ONDANSETRON HCL 8 MG PO TABS: ORAL_TABLET | ORAL | 3 refills | Status: DC

## 2021-01-19 ENCOUNTER — Encounter: Payer: Self-pay | Admitting: Hematology

## 2021-01-19 MED ORDER — DIPHENOXYLATE-ATROPINE 2.5-0.025 MG PO TABS: 1.0000 | ORAL_TABLET | Freq: Three times a day (TID) | ORAL | 0 refills | Status: DC | PRN

## 2021-01-22 ENCOUNTER — Inpatient Hospital Stay: Payer: Medicare Other

## 2021-01-22 ENCOUNTER — Other Ambulatory Visit: Payer: Self-pay

## 2021-01-22 ENCOUNTER — Telehealth: Payer: Self-pay | Admitting: Pulmonary Disease

## 2021-01-22 ENCOUNTER — Inpatient Hospital Stay: Payer: Medicare Other | Attending: Hematology

## 2021-01-22 ENCOUNTER — Encounter: Payer: Self-pay | Admitting: Hematology

## 2021-01-22 ENCOUNTER — Other Ambulatory Visit: Payer: Self-pay | Admitting: Hematology

## 2021-01-22 VITALS — BP 153/76 | HR 99 | Temp 98.0°F | Resp 18

## 2021-01-22 DIAGNOSIS — C50511 Malignant neoplasm of lower-outer quadrant of right female breast: Secondary | ICD-10-CM | POA: Insufficient documentation

## 2021-01-22 DIAGNOSIS — R911 Solitary pulmonary nodule: Secondary | ICD-10-CM | POA: Insufficient documentation

## 2021-01-22 DIAGNOSIS — Z170 Estrogen receptor positive status [ER+]: Secondary | ICD-10-CM | POA: Diagnosis not present

## 2021-01-22 DIAGNOSIS — C50919 Malignant neoplasm of unspecified site of unspecified female breast: Secondary | ICD-10-CM

## 2021-01-22 DIAGNOSIS — Z5111 Encounter for antineoplastic chemotherapy: Secondary | ICD-10-CM | POA: Diagnosis not present

## 2021-01-22 LAB — CBC WITH DIFFERENTIAL/PLATELET
Abs Immature Granulocytes: 0.01 10*3/uL (ref 0.00–0.07)
Basophils Absolute: 0 10*3/uL (ref 0.0–0.1)
Basophils Relative: 1 %
Eosinophils Absolute: 0 10*3/uL (ref 0.0–0.5)
Eosinophils Relative: 1 %
HCT: 32.6 % — ABNORMAL LOW (ref 36.0–46.0)
Hemoglobin: 10.7 g/dL — ABNORMAL LOW (ref 12.0–15.0)
Immature Granulocytes: 0 %
Lymphocytes Relative: 52 %
Lymphs Abs: 2.1 10*3/uL (ref 0.7–4.0)
MCH: 34 pg (ref 26.0–34.0)
MCHC: 32.8 g/dL (ref 30.0–36.0)
MCV: 103.5 fL — ABNORMAL HIGH (ref 80.0–100.0)
Monocytes Absolute: 0.3 10*3/uL (ref 0.1–1.0)
Monocytes Relative: 7 %
Neutro Abs: 1.6 10*3/uL — ABNORMAL LOW (ref 1.7–7.7)
Neutrophils Relative %: 39 %
Platelets: 218 10*3/uL (ref 150–400)
RBC: 3.15 MIL/uL — ABNORMAL LOW (ref 3.87–5.11)
RDW: 14.1 % (ref 11.5–15.5)
WBC: 4.1 10*3/uL (ref 4.0–10.5)
nRBC: 0 % (ref 0.0–0.2)

## 2021-01-22 LAB — CMP (CANCER CENTER ONLY)
ALT: 64 U/L — ABNORMAL HIGH (ref 0–44)
AST: 63 U/L — ABNORMAL HIGH (ref 15–41)
Albumin: 3.6 g/dL (ref 3.5–5.0)
Alkaline Phosphatase: 156 U/L — ABNORMAL HIGH (ref 38–126)
Anion gap: 14 (ref 5–15)
BUN: 11 mg/dL (ref 6–20)
CO2: 27 mmol/L (ref 22–32)
Calcium: 9.2 mg/dL (ref 8.9–10.3)
Chloride: 102 mmol/L (ref 98–111)
Creatinine: 1.43 mg/dL — ABNORMAL HIGH (ref 0.44–1.00)
GFR, Estimated: 43 mL/min — ABNORMAL LOW (ref >60)
Glucose, Bld: 113 mg/dL — ABNORMAL HIGH (ref 70–99)
Potassium: 3.9 mmol/L (ref 3.5–5.1)
Sodium: 143 mmol/L (ref 135–145)
Total Bilirubin: 0.6 mg/dL (ref 0.3–1.2)
Total Protein: 7.7 g/dL (ref 6.5–8.1)

## 2021-01-22 MED ORDER — FULVESTRANT 250 MG/5ML IM SOLN
500.0000 mg | Freq: Once | INTRAMUSCULAR | Status: AC
  Administered 2021-01-22: 500 mg via INTRAMUSCULAR
  Filled 2021-01-22: qty 10

## 2021-01-22 NOTE — Patient Instructions (Signed)
Fulvestrant injection What is this medication? FULVESTRANT (ful VES trant) blocks the effects of estrogen. It is used to treat breast cancer. This medicine may be used for other purposes; ask your health care provider or pharmacist if you have questions. COMMON BRAND NAME(S): FASLODEX What should I tell my care team before I take this medication? They need to know if you have any of these conditions: bleeding disorders liver disease low blood counts, like low white cell, platelet, or red cell counts an unusual or allergic reaction to fulvestrant, other medicines, foods, dyes, or preservatives pregnant or trying to get pregnant breast-feeding How should I use this medication? This medicine is for injection into a muscle. It is usually given by a health care professional in a hospital or clinic setting. Talk to your pediatrician regarding the use of this medicine in children. Special care may be needed. Overdosage: If you think you have taken too much of this medicine contact a poison control center or emergency room at once. NOTE: This medicine is only for you. Do not share this medicine with others. What if I miss a dose? It is important not to miss your dose. Call your doctor or health care professional if you are unable to keep an appointment. What may interact with this medication? medicines that treat or prevent blood clots like warfarin, enoxaparin, dalteparin, apixaban, dabigatran, and rivaroxaban This list may not describe all possible interactions. Give your health care provider a list of all the medicines, herbs, non-prescription drugs, or dietary supplements you use. Also tell them if you smoke, drink alcohol, or use illegal drugs. Some items may interact with your medicine. What should I watch for while using this medication? Your condition will be monitored carefully while you are receiving this medicine. You will need important blood work done while you are taking this  medicine. Do not become pregnant while taking this medicine or for at least 1 year after stopping it. Women of child-bearing potential will need to have a negative pregnancy test before starting this medicine. Women should inform their doctor if they wish to become pregnant or think they might be pregnant. There is a potential for serious side effects to an unborn child. Men should inform their doctors if they wish to father a child. This medicine may lower sperm counts. Talk to your health care professional or pharmacist for more information. Do not breast-feed an infant while taking this medicine or for 1 year after the last dose. What side effects may I notice from receiving this medication? Side effects that you should report to your doctor or health care professional as soon as possible: allergic reactions like skin rash, itching or hives, swelling of the face, lips, or tongue feeling faint or lightheaded, falls pain, tingling, numbness, or weakness in the legs signs and symptoms of infection like fever or chills; cough; flu-like symptoms; sore throat vaginal bleeding Side effects that usually do not require medical attention (report to your doctor or health care professional if they continue or are bothersome): aches, pains constipation diarrhea headache hot flashes nausea, vomiting pain at site where injected stomach pain This list may not describe all possible side effects. Call your doctor for medical advice about side effects. You may report side effects to FDA at 1-800-FDA-1088. Where should I keep my medication? This drug is given in a hospital or clinic and will not be stored at home. NOTE: This sheet is a summary. It may not cover all possible information. If you have   questions about this medicine, talk to your doctor, pharmacist, or health care provider.  2022 Elsevier/Gold Standard (2017-08-31 11:34:41)  

## 2021-01-22 NOTE — Telephone Encounter (Signed)
I called and spoke with patient regarding mychart message. Patient stated her breathing has gotten worse over the last few months and she wants to be seen in the office. Looks like she is overdue for follow up with Dr. [name]. Made an appt with Dr. [name] on 01/26/21. Patient verbalized understanding, nothing further needed.

## 2021-01-22 NOTE — Telephone Encounter (Signed)
ATC patient to talk about symptoms. No answer, left VM with office number for callback

## 2021-01-25 ENCOUNTER — Encounter: Payer: Self-pay | Admitting: Hematology

## 2021-01-26 ENCOUNTER — Ambulatory Visit: Payer: Self-pay | Admitting: Pulmonary Disease

## 2021-02-17 ENCOUNTER — Encounter: Payer: Self-pay | Admitting: Hematology

## 2021-02-19 ENCOUNTER — Inpatient Hospital Stay: Payer: Medicare Other | Admitting: Hematology

## 2021-02-19 ENCOUNTER — Inpatient Hospital Stay: Payer: Medicare Other | Attending: Hematology

## 2021-02-19 ENCOUNTER — Inpatient Hospital Stay: Payer: Medicare Other

## 2021-02-22 ENCOUNTER — Encounter: Payer: Self-pay | Admitting: Hematology

## 2021-02-22 MED ORDER — CLINDAMYCIN PHOSPHATE 1 % EX GEL: Freq: Two times a day (BID) | CUTANEOUS | 2 refills | Status: DC

## 2021-02-23 ENCOUNTER — Telehealth: Payer: Self-pay | Admitting: Hematology

## 2021-02-23 NOTE — Telephone Encounter (Signed)
Scheduled per sch msg. Called and spoke with patient. Confirmed appt  

## 2021-03-01 ENCOUNTER — Inpatient Hospital Stay: Payer: Medicare Other

## 2021-03-01 ENCOUNTER — Inpatient Hospital Stay: Payer: Medicare Other | Admitting: Hematology

## 2021-03-01 ENCOUNTER — Telehealth: Payer: Self-pay | Admitting: Hematology

## 2021-03-01 NOTE — Telephone Encounter (Signed)
R/s appts per sch msg. Called and left msg

## 2021-03-04 ENCOUNTER — Ambulatory Visit (INDEPENDENT_AMBULATORY_CARE_PROVIDER_SITE_OTHER): Payer: Medicare Other | Admitting: Pulmonary Disease

## 2021-03-04 ENCOUNTER — Other Ambulatory Visit (HOSPITAL_COMMUNITY): Payer: Self-pay

## 2021-03-04 ENCOUNTER — Encounter: Payer: Self-pay | Admitting: Hematology

## 2021-03-04 ENCOUNTER — Other Ambulatory Visit: Payer: Self-pay

## 2021-03-04 ENCOUNTER — Encounter: Payer: Self-pay | Admitting: Pulmonary Disease

## 2021-03-04 VITALS — BP 134/82 | HR 81 | Temp 97.9°F | Ht 67.0 in | Wt 219.2 lb

## 2021-03-04 DIAGNOSIS — R918 Other nonspecific abnormal finding of lung field: Secondary | ICD-10-CM

## 2021-03-04 DIAGNOSIS — R0602 Shortness of breath: Secondary | ICD-10-CM | POA: Insufficient documentation

## 2021-03-04 DIAGNOSIS — R0600 Dyspnea, unspecified: Secondary | ICD-10-CM | POA: Insufficient documentation

## 2021-03-04 DIAGNOSIS — J984 Other disorders of lung: Secondary | ICD-10-CM | POA: Diagnosis not present

## 2021-03-04 MED ORDER — FLUTICASONE FUROATE-VILANTEROL 100-25 MCG/INH IN AEPB
1.0000 | INHALATION_SPRAY | Freq: Every day | RESPIRATORY_TRACT | 2 refills | Status: DC
  Filled 2021-03-04: qty 60, 30d supply, fill #0

## 2021-03-04 MED ORDER — ALBUTEROL SULFATE HFA 108 (90 BASE) MCG/ACT IN AERS
2.0000 | INHALATION_SPRAY | Freq: Four times a day (QID) | RESPIRATORY_TRACT | 6 refills | Status: DC | PRN
  Filled 2021-03-04: qty 18, 25d supply, fill #0

## 2021-03-04 NOTE — Progress Notes (Signed)
Subjective:   PATIENT ID: Brandy [name] GENDER: female DOB: 1965-07-08, MRN: [id number]   HPI  Chief Complaint  Patient presents with   Follow-up    Still sob with movement    Reason for Visit: Follow-up  [name] is a 55 year old female former smoker with hx of breast cancer, obesity, chronic diastolic heart failure, chronic pain, anxiety and insomnia who presents for follow-up  She reports shortness of breath with heavy exertion such as with walking at a brisk pace. She slows down but otherwise able to perform activities of daily living. She does not use the rescue inhaler. She has occasional non-productive cough thought to be related to her cancer treatment. Denies wheezing. She continues to follow with Oncology for breast cancer, currently Verzenio which was started on Oct 2020.   03/04/21 In the last six months, she reports shortness of breath that has gradually worsened. Some coughing, nonproductive. No wheezing. Will get lightheaded and dizzy with activity. No significant respiratory infections since our last visit. She reports possible aspiration when eating/drinking. She has reflux as well that intermittently occurs. She is enrolled in [employer] and states that the chair exercises are sometimes difficult.  Social History: Quit smoking in 2020. 1/4 ppd x 25 years.    Past Medical History:  Diagnosis Date   Acute pansinusitis 08/02/2017   Anxiety    Arthritis    ASD (atrial septal defect)    s/p closure with Amplatzer device 10/05/04 (Dr. Myriam Jacobson, [facility]) 10/05/04   Cancer ([place])    Cataract    Dyspnea    GERD (gastroesophageal reflux disease)    Heart murmur    no longer heard   History of hiatal hernia    Legally blind in right eye, as defined in [place]    Lumbar herniated disc    PONV (postoperative nausea and vomiting)    Sciatica      Allergies  Allergen Reactions   Zithromax [Azithromycin] Shortness Of Breath and Itching     TOTAL BODY ITCHING [EVEN SOLES OF FEET] WHEEZING    Psyllium Nausea And Vomiting     Outpatient Medications Prior to Visit  Medication Sig Dispense Refill   acetaminophen (TYLENOL) 500 MG tablet Take 500-1,000 mg by mouth every 6 (six) hours as needed for moderate pain or headache.     albuterol (PROVENTIL) (2.5 MG/3ML) 0.083% nebulizer solution Take 3 mLs (2.5 mg total) by nebulization every 6 (six) hours as needed for wheezing or shortness of breath. 75 mL 0   ALPRAZolam (XANAX) 1 MG tablet Take 1 mg by mouth at bedtime.     amitriptyline (ELAVIL) 75 MG tablet Take 75 mg by mouth at bedtime.      Carboxymethylcellul-Glycerin (LUBRICATING EYE DROPS OP) Place 1 drop into the left eye 4 (four) times daily.     clindamycin (CLINDAGEL) 1 % gel Apply topically 2 (two) times daily. To hidradenitis lesions in groin and armpits 30 g 2   Cyanocobalamin (B-12 PO) Take 1 tablet by mouth daily.     diphenoxylate-atropine (LOMOTIL) 2.5-0.025 MG tablet Take 1 tablet by mouth 3 (three) times daily as needed for diarrhea or loose stools. 30 tablet 0   HYDROcodone-acetaminophen (NORCO/VICODIN) 5-325 MG tablet Take 1 tablet by mouth every 4 (four) hours as needed for moderate pain or severe pain. 10 tablet 0   loperamide (IMODIUM) 2 MG capsule Take 2-4 mg by mouth 4 (four) times daily as needed for diarrhea or  loose stools.     Magnesium 500 MG TABS Take 1 tablet (500 mg total) by mouth in the morning and at bedtime. 60 tablet 1   Multiple Vitamin (MULTIVITAMIN WITH MINERALS) TABS tablet Take 1 tablet by mouth daily.     omeprazole (PRILOSEC OTC) 20 MG tablet Take 20 mg by mouth daily.     ondansetron (ZOFRAN) 8 MG tablet TAKE 1 TABLET(8 MG) BY MOUTH EVERY 8 HOURS AS NEEDED FOR NAUSEA OR VOMITING 30 tablet 3   Saccharomyces boulardii 400 MG CAPS Take 1 capsule by mouth in the morning and at bedtime. 60 capsule 0   Travoprost, BAK Free, (TRAVATAN) 0.004 % SOLN ophthalmic solution Place 1 drop into the left eye  at bedtime.     VERZENIO 100 MG tablet TAKE 1 TABLET (100 MG) BY MOUTH TWICE A DAY. SWALLOW TABLETS WHOLE. DO NOT CHEW, CRUSH, OR SPLIT TABLETS 56 tablet 1   Vitamin D, Ergocalciferol, (DRISDOL) 1.25 MG (50000 UNIT) CAPS capsule TAKE 1 CAPSULE ONCE PER WEEK 12 capsule 3   White Petrolatum-Mineral Oil (LUBRICANT EYE NIGHTTIME) OINT Place 1 drop into the left eye at bedtime.     No facility-administered medications prior to visit.    Review of Systems  Constitutional:  Negative for chills, diaphoresis, fever, malaise/fatigue and weight loss.  HENT:  Negative for congestion.   Respiratory:  Positive for cough and shortness of breath. Negative for hemoptysis, sputum production and wheezing.   Cardiovascular:  Negative for chest pain, palpitations and leg swelling.   Objective:   Vitals:   03/04/21 1614  BP: 134/82  Pulse: 81  Temp: 97.9 F (36.6 C)  TempSrc: Oral  SpO2: 99%  Weight: 219 lb 3.2 oz (99.4 kg)  Height: 5\' 7"  (1.702 m)      Physical Exam: General: Well-appearing, no acute distress HENT: [place], AT Eyes: S/p right enucleation, left EOMI, no scleral icterus Respiratory: Clear to auscultation bilaterally.  No crackles, wheezing or rales Cardiovascular: RRR, -M/R/G, no JVD Extremities:-Edema,-tenderness Neuro: AAO x4, CNII-XII grossly intact Psych: Normal mood, normal affect   Data Reviewed:  Imaging: CTA 10/30/19 - No pulmonary embolism. RUL 10x70mm, 56mm, unchanged. RLL nodule 68mm new CT Chest 05/21/20 - No evidence of ILD. 1.4 x 1.0 x 0.8 cm aggressive appearing nodule near the apex of the right upper lobe PET 08/07/20 - 8 mm irregular nodule in right apex improved compared to CT 2020  PFT: 12/18/19 FVC 3.13 (83%) FEV1 2.49 (92%) Ratio 88  TLC 78% DLCO 58% Interpretation: Mild restrictive defect with moderate reduction in gas exchange  02/17/20 Negative methacholine challenge  Echocardiogram: 09/2019 Normal EF, grade I DD, mild MR.  CBC    Component Value  Date/Time   WBC 4.1 01/22/2021 1137   RBC 3.15 (L) 01/22/2021 1137   HGB 10.7 (L) 01/22/2021 1137   HGB 11.1 (L) 05/21/2020 1154   HCT 32.6 (L) 01/22/2021 1137   PLT 218 01/22/2021 1137   PLT 223 05/21/2020 1154   MCV 103.5 (H) 01/22/2021 1137   MCH 34.0 01/22/2021 1137   MCHC 32.8 01/22/2021 1137   RDW 14.1 01/22/2021 1137   LYMPHSABS 2.1 01/22/2021 1137   MONOABS 0.3 01/22/2021 1137   EOSABS 0.0 01/22/2021 1137   BASOSABS 0.0 01/22/2021 1137   Absolute eos 300 01/22/21     Assessment & Plan:   Discussion: 55 year old female former smoker (6 pack-years) with metastatic breast cancer s/p right lumpectomy with pulmonary mets, mild restrictive lung defect  who  presents for follow-up. Recent oncology note 12/25/20 with no progression of breast cancer. No PET-avid lesions on 08/2020.  Shortness of breath -Prior methacholine challenge which was negative for asthma -ARRANGE for pulmonary function tests -START Breo 100-25 mcg ONE puff ONCE a day -START Albuterol AS NEEDED for shortness of breath  Restrictive defect on PFTs --PFTs as noted above  Pulmonary nodules - favor mets from breast cancer --Improved on PET/CT 08/07/20  Health Maintenance Immunization History  Administered Date(s) Administered   Influenza Inj Mdck Quad Pf 04/25/2019   Influenza,inj,Quad PF,6+ Mos 02/24/2020   Influenza,inj,quad, With Preservative 04/25/2019   Moderna Sars-Covid-2 Vaccination 06/29/2019, 08/03/2019   Pneumococcal Conjugate-13 05/29/2019   Pneumococcal Polysaccharide-23 07/29/2019   CT Lung Screen - not qualified  Orders Placed This Encounter  Procedures   Pulmonary function test    Standing Status:   Future    Standing Expiration Date:   03/04/2022    Scheduling Instructions:     Next available    Order Specific Question:   Where should this test be performed?    Answer:    Pulmonary   Meds ordered this encounter  Medications   fluticasone furoate-vilanterol (BREO ELLIPTA)  100-25 MCG/INH AEPB    Sig: Inhale 1 puff into the lungs once daily.    Dispense:  60 each    Refill:  2   albuterol (VENTOLIN HFA) 108 (90 Base) MCG/ACT inhaler    Sig: Inhale 2 puffs into the lungs every 6 (six) hours as needed for wheezing or shortness of breath.    Dispense:  8 g    Refill:  6    No follow-ups on file. After PFTs  I have spent a total time of 32-minutes on the day of the appointment reviewing prior documentation, coordinating care and discussing medical diagnosis and plan with the patient/family. Past medical history, allergies, medications were reviewed. Pertinent imaging, labs and tests included in this note have been reviewed and interpreted independently by me.  [place], MD [facility] 03/04/2021 12:08 PM  Office Number [phone]

## 2021-03-04 NOTE — Patient Instructions (Signed)
  Shortness of breath -Prior methacholine challenge which was negative for asthma -ARRANGE for pulmonary function tests -START Breo ONE puff ONCE a day -START Albuterol AS NEEDED for shortness of breath  Follow-up with me after PFTs

## 2021-03-05 ENCOUNTER — Encounter: Payer: Self-pay | Admitting: Hematology

## 2021-03-05 ENCOUNTER — Other Ambulatory Visit (HOSPITAL_COMMUNITY): Payer: Self-pay

## 2021-03-08 ENCOUNTER — Other Ambulatory Visit: Payer: Self-pay

## 2021-03-08 ENCOUNTER — Inpatient Hospital Stay: Payer: Medicare Other | Attending: Hematology

## 2021-03-08 ENCOUNTER — Inpatient Hospital Stay: Payer: Medicare Other

## 2021-03-08 ENCOUNTER — Inpatient Hospital Stay (HOSPITAL_BASED_OUTPATIENT_CLINIC_OR_DEPARTMENT_OTHER): Payer: Medicare Other | Admitting: Hematology

## 2021-03-08 VITALS — BP 145/73 | HR 87 | Temp 97.8°F | Resp 18 | Ht 67.0 in | Wt 220.0 lb

## 2021-03-08 DIAGNOSIS — Z5111 Encounter for antineoplastic chemotherapy: Secondary | ICD-10-CM | POA: Diagnosis not present

## 2021-03-08 DIAGNOSIS — K760 Fatty (change of) liver, not elsewhere classified: Secondary | ICD-10-CM | POA: Insufficient documentation

## 2021-03-08 DIAGNOSIS — Z833 Family history of diabetes mellitus: Secondary | ICD-10-CM | POA: Diagnosis not present

## 2021-03-08 DIAGNOSIS — R7989 Other specified abnormal findings of blood chemistry: Secondary | ICD-10-CM | POA: Diagnosis not present

## 2021-03-08 DIAGNOSIS — M545 Low back pain, unspecified: Secondary | ICD-10-CM | POA: Diagnosis not present

## 2021-03-08 DIAGNOSIS — Z801 Family history of malignant neoplasm of trachea, bronchus and lung: Secondary | ICD-10-CM | POA: Insufficient documentation

## 2021-03-08 DIAGNOSIS — C50511 Malignant neoplasm of lower-outer quadrant of right female breast: Secondary | ICD-10-CM | POA: Diagnosis present

## 2021-03-08 DIAGNOSIS — C50919 Malignant neoplasm of unspecified site of unspecified female breast: Secondary | ICD-10-CM

## 2021-03-08 DIAGNOSIS — R911 Solitary pulmonary nodule: Secondary | ICD-10-CM | POA: Diagnosis not present

## 2021-03-08 DIAGNOSIS — Z8249 Family history of ischemic heart disease and other diseases of the circulatory system: Secondary | ICD-10-CM | POA: Insufficient documentation

## 2021-03-08 DIAGNOSIS — L732 Hidradenitis suppurativa: Secondary | ICD-10-CM | POA: Insufficient documentation

## 2021-03-08 DIAGNOSIS — Z170 Estrogen receptor positive status [ER+]: Secondary | ICD-10-CM | POA: Insufficient documentation

## 2021-03-08 DIAGNOSIS — Z87891 Personal history of nicotine dependence: Secondary | ICD-10-CM | POA: Diagnosis not present

## 2021-03-08 DIAGNOSIS — J449 Chronic obstructive pulmonary disease, unspecified: Secondary | ICD-10-CM | POA: Insufficient documentation

## 2021-03-08 LAB — CBC WITH DIFFERENTIAL/PLATELET
Abs Immature Granulocytes: 0.02 10*3/uL (ref 0.00–0.07)
Basophils Absolute: 0 10*3/uL (ref 0.0–0.1)
Basophils Relative: 1 %
Eosinophils Absolute: 0 10*3/uL (ref 0.0–0.5)
Eosinophils Relative: 1 %
HCT: 33.9 % — ABNORMAL LOW (ref 36.0–46.0)
Hemoglobin: 11.1 g/dL — ABNORMAL LOW (ref 12.0–15.0)
Immature Granulocytes: 0 %
Lymphocytes Relative: 53 %
Lymphs Abs: 2.5 10*3/uL (ref 0.7–4.0)
MCH: 33.7 pg (ref 26.0–34.0)
MCHC: 32.7 g/dL (ref 30.0–36.0)
MCV: 103 fL — ABNORMAL HIGH (ref 80.0–100.0)
Monocytes Absolute: 0.3 10*3/uL (ref 0.1–1.0)
Monocytes Relative: 6 %
Neutro Abs: 1.8 10*3/uL (ref 1.7–7.7)
Neutrophils Relative %: 39 %
Platelets: 304 10*3/uL (ref 150–400)
RBC: 3.29 MIL/uL — ABNORMAL LOW (ref 3.87–5.11)
RDW: 14.4 % (ref 11.5–15.5)
WBC: 4.7 10*3/uL (ref 4.0–10.5)
nRBC: 0 % (ref 0.0–0.2)

## 2021-03-08 LAB — CMP (CANCER CENTER ONLY)
ALT: 47 U/L — ABNORMAL HIGH (ref 0–44)
AST: 55 U/L — ABNORMAL HIGH (ref 15–41)
Albumin: 3.4 g/dL — ABNORMAL LOW (ref 3.5–5.0)
Alkaline Phosphatase: 138 U/L — ABNORMAL HIGH (ref 38–126)
Anion gap: 11 (ref 5–15)
BUN: 10 mg/dL (ref 6–20)
CO2: 29 mmol/L (ref 22–32)
Calcium: 9.3 mg/dL (ref 8.9–10.3)
Chloride: 104 mmol/L (ref 98–111)
Creatinine: 1.26 mg/dL — ABNORMAL HIGH (ref 0.44–1.00)
GFR, Estimated: 50 mL/min — ABNORMAL LOW (ref >60)
Glucose, Bld: 101 mg/dL — ABNORMAL HIGH (ref 70–99)
Potassium: 4.1 mmol/L (ref 3.5–5.1)
Sodium: 144 mmol/L (ref 135–145)
Total Bilirubin: 0.4 mg/dL (ref 0.3–1.2)
Total Protein: 7.7 g/dL (ref 6.5–8.1)

## 2021-03-08 MED ORDER — FULVESTRANT 250 MG/5ML IM SOLN
500.0000 mg | Freq: Once | INTRAMUSCULAR | Status: AC
  Administered 2021-03-08: 500 mg via INTRAMUSCULAR
  Filled 2021-03-08: qty 10

## 2021-03-09 ENCOUNTER — Other Ambulatory Visit: Payer: Self-pay | Admitting: *Deleted

## 2021-03-09 ENCOUNTER — Telehealth: Payer: Self-pay | Admitting: Hematology

## 2021-03-09 MED ORDER — FLUTICASONE FUROATE-VILANTEROL 100-25 MCG/INH IN AEPB: 1.0000 | INHALATION_SPRAY | Freq: Every day | RESPIRATORY_TRACT | 2 refills | Status: DC

## 2021-03-09 NOTE — Telephone Encounter (Signed)
Scheduled follow-up appointments per 10/3 los. Patient is aware. 

## 2021-03-10 ENCOUNTER — Telehealth: Payer: Self-pay | Admitting: Pulmonary Disease

## 2021-03-10 MED ORDER — FLUTICASONE FUROATE-VILANTEROL 100-25 MCG/INH IN AEPB: 1.0000 | INHALATION_SPRAY | Freq: Every day | RESPIRATORY_TRACT | 5 refills | Status: AC

## 2021-03-10 NOTE — Telephone Encounter (Signed)
Call made to Brandy [name], confirmed patient DOB. They do not have a Breo script in their system at all. They will need a new script if patient wants to fill with them.   LMTCB with patient.   New script sent.

## 2021-03-10 NOTE — Telephone Encounter (Signed)
Spoke with pt and reviewed Brittany's message. Pt stated understanding. Nothing further needed at this time.

## 2021-03-15 ENCOUNTER — Other Ambulatory Visit (HOSPITAL_COMMUNITY): Payer: Self-pay

## 2021-03-15 ENCOUNTER — Encounter: Payer: Self-pay | Admitting: Hematology

## 2021-03-15 NOTE — Progress Notes (Signed)
HEMATOLOGY/ONCOLOGY CLINIC NOTE  Date of Service: .03/08/2021   Patient Care Team: [facility] as PCP - General Everardo Beals, NP as Nurse Practitioner [facility] CHIEF COMPLAINTS/PURPOSE OF CONSULTATION:  -f/u for metastatic breast cancer   HISTORY OF PRESENTING ILLNESS:  GRACILYN [name] is a wonderful 55 y.o. female who has been referred to [place] by [facility] for evaluation and management of her suspected breast cancer.   The pt reports that she was having increasing low back pain on her right side that would not resolve, so she went to the ED. This pain has not resolved. She notes pain in her hips and knees.   Of note prior to the patient's visit today, pt has had an abdominal limited right upper ultrasound completed on 02/18/2019 with results revealing "Probable hepatic steatosis. No other abnormality seen in the right upper quadrant of the abdomen."  Pt had abdomen and pelvis CT completed on 02/18/2019 with results revealing "5cm spiculated soft tissue mass in inferior right breast, highly suspicious for primary breast carcinoma. No acute findings or metastatic disease within the abdomen or pelvis. Multiple small pulmonary nodules in both lung bases, consistent with pulmonary metastases. 4.5 cm uterine fibroid."  Most recent lab results (02/18/2019) of CBC is as follows: all values are WNL except for glucose at 121, calcium at 8.7, AST at 49, and ALT at 53.  On review of systems, pt reports pedal edema and denies chest pain, shortness of breath, weight loss, and any other symptoms.  Her last known mammography was on 06/18/2012.   On Social Hx the pt reports that she is a smoker and she smokes about half a pack per day. She does not drink alcohol. She has three children: age 55, 10, and 14.  On Family Hx the pt reports lung cancer. Maternal Cousin passed from stomach cancer at the age of 6.    INTERVAL HISTORY:  Brandy [name] is a 55 y.o. female here for evaluation and management of metastatic breast cancer. The patient's last visit with [place] was on 0722/2022. The pt reports that she is doing well overall.  The pt reports she has tolerated the Verzenio well with her diarrhea having significantly improved.  Still having issues with her COPD and breathing.  Some grade 1 hot flashes.  Had surgery for hidradenitis suppurativa. No other primary toxicities from Verzenio or Faslodex.  Lab results today 03/08/2021 reviewed with the patient.  On review of systems, pt reports she notes she has been doing okay.   MEDICAL HISTORY:  Past Medical History:  Diagnosis Date   Acute pansinusitis 08/02/2017   Anxiety    Arthritis    ASD (atrial septal defect)    s/p closure with Amplatzer device 10/05/04 (Dr. Myriam Jacobson, [place]) 10/05/04   Cancer ([place])    Cataract    Dyspnea    GERD (gastroesophageal reflux disease)    Heart murmur    no longer heard   History of hiatal hernia    Legally blind in right eye, as defined in [place]    Lumbar herniated disc    PONV (postoperative nausea and vomiting)    Sciatica   Uterine Fibroids  SURGICAL HISTORY: Past Surgical History:  Procedure Laterality Date   ABLATION     BREAST LUMPECTOMY WITH RADIOACTIVE SEED AND SENTINEL LYMPH NODE BIOPSY Right 11/23/2020   Procedure: RIGHT BREAST LUMPECTOMY WITH RADIOACTIVE SEED AND RIGHT AXILLARY SENTINEL LYMPH NODE BIOPSY;  Surgeon: Rolm Bookbinder, MD;  Location: [place];  Service: General;  Laterality: Right;   BREAST SURGERY Bilateral 2011   Breast Reduction Surgery   BUNIONECTOMY     CARDIAC CATHETERIZATION     10/05/04 [facility]): LM < 25%, otherwise normal coronaries. No pulmonary HTN, Mildly enlarged RV. Secundum ASD s/p closure.   CARDIAC SURGERY     CATARACT EXTRACTION     CLEFT PALATE REPAIR     s/p cleft lip and palate repair   EYE SURGERY Right 2019   right eye removed   LUMBAR LAMINECTOMY/DECOMPRESSION MICRODISCECTOMY  Left 05/23/2016   Procedure: LEFT L4-L5 LATERAL RECESS DECOMPRESSION WITH CENTRAL AND RIGHT DECOMPRESSION VIA LEFT SIDE;  Surgeon: [name], MD;  Location: [place];  Service: Orthopedics;  Laterality: Left;   LUMBAR LAMINECTOMY/DECOMPRESSION MICRODISCECTOMY Left 05/23/2016   Procedure: LUMBAR LAMINECTOMY/DECOMPRESSION MICRODISCECTOMY Lumbar five - Sacral One 1 LEVEL;  Surgeon: [name], MD;  Location: [place];  Service: Orthopedics;  Laterality: Left;   REDUCTION MAMMAPLASTY  2011   SHOULDER INJECTION Left 05/23/2016   Procedure: SHOULDER INJECTION;  Surgeon: [name], MD;  Location: [place];  Service: Orthopedics;  Laterality: Left;  band-aid per pa-c   TRANSTHORACIC ECHOCARDIOGRAM     12/15/05 [facility]): Mild LVH, EF > 41%, grade 1 diastolic dysfunction, Trivial MR/PR/TR.   TUBAL LIGATION    Endometrial ablation 2003 Breast Reduction, bilateral 2011  SOCIAL HISTORY: Social History   Socioeconomic History   Marital status: Married    Spouse name: Not on file   Number of children: 3   Years of education: Not on file   Highest education level: Not on file  Occupational History   Not on file  Tobacco Use   Smoking status: Former    Packs/day: 0.25    Years: 25.00    Pack years: 6.25    Types: Cigarettes    Quit date: 02/04/2018    Years since quitting: 3.1   Smokeless tobacco: Never  Vaping Use   Vaping Use: Never used  Substance and Sexual Activity   Alcohol use: No   Drug use: No   Sexual activity: Not on file  Other Topics Concern   Not on file  Social History Narrative   Not on file   Social Determinants of Health   Financial Resource Strain: Not on file  Food Insecurity: Not on file  Transportation Needs: Not on file  Physical Activity: Not on file  Stress: Not on file  Social Connections: Not on file  Intimate Partner Violence: Not on file    FAMILY HISTORY: Family History  Problem Relation Age of Onset   Diabetes Mother    High blood pressure Mother     Cancer Father        Lung   Colon cancer Neg Hx    Colon polyps Neg Hx    Esophageal cancer Neg Hx    Rectal cancer Neg Hx    Stomach cancer Neg Hx     ALLERGIES:  is allergic to zithromax [azithromycin] and psyllium.  MEDICATIONS:  Current Outpatient Medications  Medication Sig Dispense Refill   acetaminophen (TYLENOL) 500 MG tablet Take 500-1,000 mg by mouth every 6 (six) hours as needed for moderate pain or headache. (Patient not taking: Reported on 03/04/2021)     albuterol (PROVENTIL) (2.5 MG/3ML) 0.083% nebulizer solution Take 3 mLs (2.5 mg total) by nebulization every 6 (six) hours as needed for wheezing or shortness of breath. 75 mL 0  albuterol (VENTOLIN HFA) 108 (90 Base) MCG/ACT inhaler Inhale 2 puffs into the lungs every 6  hours as needed for wheezing or shortness of breath. 18 g 6   ALPRAZolam (XANAX) 1 MG tablet Take 1 mg by mouth at bedtime.     amitriptyline (ELAVIL) 75 MG tablet Take 75 mg by mouth at bedtime.      Carboxymethylcellul-Glycerin (LUBRICATING EYE DROPS OP) Place 1 drop into the left eye 4 (four) times daily.     clindamycin (CLINDAGEL) 1 % gel Apply topically 2 (two) times daily. To hidradenitis lesions in groin and armpits (Patient not taking: Reported on 03/04/2021) 30 g 2   Cyanocobalamin (B-12 PO) Take 1 tablet by mouth daily.     diphenoxylate-atropine (LOMOTIL) 2.5-0.025 MG tablet Take 1 tablet by mouth 3 (three) times daily as needed for diarrhea or loose stools. 30 tablet 0   fluticasone furoate-vilanterol (BREO ELLIPTA) 100-25 MCG/INH AEPB Inhale 1 puff into the lungs once daily. 60 each 5   HYDROcodone-acetaminophen (NORCO/VICODIN) 5-325 MG tablet Take 1 tablet by mouth every 4 (four) hours as needed for moderate pain or severe pain. (Patient not taking: Reported on 03/04/2021) 10 tablet 0   loperamide (IMODIUM) 2 MG capsule Take 2-4 mg by mouth 4 (four) times daily as needed for diarrhea or loose stools.     Magnesium 500 MG TABS Take 1 tablet  (500 mg total) by mouth in the morning and at bedtime. (Patient not taking: Reported on 03/04/2021) 60 tablet 1   Multiple Vitamin (MULTIVITAMIN WITH MINERALS) TABS tablet Take 1 tablet by mouth daily. (Patient not taking: Reported on 03/04/2021)     omeprazole (PRILOSEC OTC) 20 MG tablet Take 20 mg by mouth daily.     ondansetron (ZOFRAN) 8 MG tablet TAKE 1 TABLET(8 MG) BY MOUTH EVERY 8 HOURS AS NEEDED FOR NAUSEA OR VOMITING 30 tablet 3   Saccharomyces boulardii 400 MG CAPS Take 1 capsule by mouth in the morning and at bedtime. (Patient not taking: Reported on 03/04/2021) 60 capsule 0   Travoprost, BAK Free, (TRAVATAN) 0.004 % SOLN ophthalmic solution Place 1 drop into the left eye at bedtime.     VERZENIO 100 MG tablet TAKE 1 TABLET (100 MG) BY MOUTH TWICE A DAY. SWALLOW TABLETS WHOLE. DO NOT CHEW, CRUSH, OR SPLIT TABLETS 56 tablet 1   Vitamin D, Ergocalciferol, (DRISDOL) 1.25 MG (50000 UNIT) CAPS capsule TAKE 1 CAPSULE ONCE PER WEEK (Patient not taking: Reported on 03/04/2021) 12 capsule 3   White Petrolatum-Mineral Oil (LUBRICANT EYE NIGHTTIME) OINT Place 1 drop into the left eye at bedtime.     No current facility-administered medications for this visit.    REVIEW OF SYSTEMS:   .10 Point review of Systems was done is negative except as noted above.   PHYSICAL EXAMINATION: ECOG FS:1 - Symptomatic but completely ambulatory  Vitals:   03/08/21 1224  BP: (!) 145/73  Pulse: 87  Resp: 18  Temp: 97.8 F (36.6 C)  SpO2: 100%    Wt Readings from Last 3 Encounters:  03/08/21 220 lb (99.8 kg)  03/04/21 219 lb 3.2 oz (99.4 kg)  12/25/20 219 lb 9 oz (99.6 kg)   Body mass index is 34.46 kg/m.   Mar[facility] Kitchen GENERAL:alert, in no acute distress and comfortable SKIN: no acute rashes, no significant lesions EYES: conjunctiva are pink and non-injected, sclera anicteric OROPHARYNX: MMM, no exudates, no oropharyngeal erythema or ulceration NECK: supple, no JVD LYMPH:  no palpable lymphadenopathy in the  cervical, axillary  or inguinal regions LUNGS: clear to auscultation b/l with normal respiratory effort HEART: regular rate & rhythm ABDOMEN:  normoactive bowel sounds , non tender, not distended. Extremity: no pedal edema PSYCH: alert & oriented x 3 with fluent speech NEURO: no focal motor/sensory deficits   LABORATORY DATA:  I have reviewed the data as listed   CBC Latest Ref Rng & Units 03/08/2021 01/22/2021 12/25/2020  WBC 4.0 - 10.5 K/uL 4.7 4.1 4.5  Hemoglobin 12.0 - 15.0 g/dL 11.1(L) 10.7(L) 10.3(L)  Hematocrit 36.0 - 46.0 % 33.9(L) 32.6(L) 30.3(L)  Platelets 150 - 400 K/uL 304 218 271     CMP Latest Ref Rng & Units 03/08/2021 01/22/2021 12/25/2020  Glucose 70 - 99 mg/dL 101(H) 113(H) 130(H)  BUN 6 - 20 mg/dL _0 Creatinine 0.44 - 1.00 mg/dL 1.26(H) 1.43(H) 1.02(H)  Sodium 135 - 145 mmol/L 144 143 143  Potassium 3.5 - 5.1 mmol/L 4.1 3.9 3.4(L)  Chloride 98 - 111 mmol/L 104 102 106  CO2 22 - 32 mmol/L _1 Calcium 8.9 - 10.3 mg/dL 9.3 9.2 9.2  Total Protein 6.5 - 8.1 g/dL 7.7 7.7 7.2  Total Bilirubin 0.3 - 1.2 mg/dL 0.4 0.6 0.6  Alkaline Phos 38 - 126 U/L 138(H) 156(H) 159(H)  AST 15 - 41 U/L 55(H) 63(H) 100(H)  ALT 0 - 44 U/L 47(H) 64(H) 99(H)   Component     Latest Ref Rng & Units 03/08/2019 03/22/2019  FSH     mIU/mL 65.5   LH     mIU/mL 46.6   Estrogen     pg/mL 82   Progesterone     ng/mL <0.1   Estradiol     pg/mL  <5.0        RADIOGRAPHIC STUDIES: I have personally reviewed the radiological images as listed and agreed with the findings in the report. No results found. 09/02/2020 Surgical Pathology    ASSESSMENT & PLAN:   Brandy [name] is a 54 y.o. female with:  1. Metastatic breast cancer ER+/PRneg/Her2 neg  02/28/2019 neck CT with results revealing "negative for mass or adenopathy in the neck."  03/04/2019 head MRI with results revealing "Negative for metastatic disease.  No acute abnormality in the brain."  2. Likely  Pulmonary metastases  02/18/2019 chest and abdomen with results revealing "5cm spiculated soft tissue mass in inferior right breast, highly suspicious for primary breast carcinoma. No acute findings or metastatic disease within the abdomen or pelvis. Multiple small pulmonary nodules in both lung bases, consistent with pulmonary metastases. 4.5 cm uterine fibroid."  02/28/2019 C/A/P CT with results revealing "Irregular solid 5.0 cm right breast mass, suspicious for primary right breast malignancy. Innumerable solid pulmonary nodules scattered throughout both lungs, compatible with pulmonary metastases. No evidence of metastatic disease in the abdomen, pelvis or skeleton. Mildly enlarged and probably myomatous uterus. Simple 1.4 cm left adnexal cyst requires no follow-up. This recommendation follows ACR consensus guidelines: White Paper of the ACR Incidental Findings Committee II on Adnexal Findings. J Am Coll Radiol [phone]. Aortic Atherosclerosis (ICD10-I70.0)."  NUCLEAR MEDICINE WHOLE BODY BONE SCAN completed on 03/19/2019 with results revealing "1. No scintigraphic evidence skeletal metastasis. 2. Degenerative bone disease in the posterior elements of the upper and mid lumbar spine."   04/09/2019 Bone Density ([id number]) which revealed "The BMD measured at Femur Neck Right is 1.153 g/cm2 with a T-score of 0.8. This patient is considered normal according to [facility]) criteria. Lumbar spine was not utilized due  to advanced degenerative changes. The scan quality is good. Femur Neck Right 04/09/2019 54.7 Normal 0.8 1.153 g/cm2. Left Forearm Radius 33% 04/09/2019 54.7 Normal 0.8 0.949 g/cm2."  08/04/2019 CT Angio Chest ([id number]) revealed "1. No lobar or central pulmonary embolus detected. Exam is limited secondary to respiratory motion. 2. Mild ground-glass attenuation may represent mild pneumonitis or areas of air trapping. 3. Signs of atrial septal closure. 4. Decrease in  size and number of bilateral pulmonary nodules, marked response noted on today's exam with the only nodule remaining near a cm in the right upper lobe and the smaller nodules that were present on the previous examination throughout the chest no longer measurable though the lower lobes are limited by respiratory motion. 5. Decreased size of right breast mass. 6. Probable hepatic steatosis."  S/p rt breast lumpectomy on 6/20 -- no residual carcinoma on pathology.  3. Abnormal Liver function test -- previous? Fatty liver 07/03/2019 [place] Abd ([id number]) revealed "1. No acute findings. Normal gallbladder. No bile duct dilation. 2. Significant increased liver parenchymal echogenicity consistent with extensive hepatic steatosis."  PLAN: -Discussed pt labwork today, 03/08/2021; labs stable.-no clinical or lab findings suggestive of breast cancer progression at this time. --Recommended pt continue to stay active and walk around 20-30 min daily. -Recommended pt de-stress and eat healthy. Continue with a healthy weight loss regiment. -Will continue Faslodex at 566m q4weeks. -Continue 100 mg Verzenio BID. -Continue Vitamin D. -Continue Prilosec BID.    FOLLOW UP: Please cancel currently scheduled lab and injection appointment for 03/19/2021 Please schedule labs and Faslodex injections monthly x 6 MD visit in 2 months.  . The total time spent in the appointment was 20 minutes and more than 50% was on counseling and direct patient cares.  All of the patient's questions were answered with apparent satisfaction. The patient knows to call the clinic with any problems, questions or concerns.  GSullivan LoneMD MJacksonAAHIVMS SEndosurgical Center Of FloridaCGilbert HospitalHematology/Oncology Physician CJupiter [facility]

## 2021-03-19 ENCOUNTER — Other Ambulatory Visit: Payer: 59

## 2021-03-19 ENCOUNTER — Ambulatory Visit: Payer: 59

## 2021-03-19 ENCOUNTER — Encounter: Payer: Self-pay | Admitting: Hematology

## 2021-03-19 ENCOUNTER — Telehealth: Payer: Self-pay

## 2021-03-19 NOTE — Telephone Encounter (Signed)
Oral Oncology Patient Advocate Encounter  Met patient in [place] to complete an application for [employer] Patient Assistance Program in an effort to reduce the patient's out of pocket expense for Verzenio to $0.    Application completed and faxed to [phone].   [employer] patient assistance phone number for follow up is [phone].   This encounter will be updated until final determination.     [place] Patient [place] Phone [phone] Fax [phone] 03/19/2021 3:37 PM

## 2021-03-22 NOTE — Telephone Encounter (Signed)
Patient is approved for Verzenio at no cost from [employer] 03/22/21-06/05/21   [employer] uses [place] Patient [place] Phone [phone] Fax [phone] 03/22/2021 10:53 AM

## 2021-04-05 ENCOUNTER — Inpatient Hospital Stay: Payer: Medicare Other

## 2021-04-19 ENCOUNTER — Encounter: Payer: Self-pay | Admitting: Hematology

## 2021-04-19 ENCOUNTER — Other Ambulatory Visit: Payer: Self-pay

## 2021-04-19 DIAGNOSIS — C50919 Malignant neoplasm of unspecified site of unspecified female breast: Secondary | ICD-10-CM

## 2021-04-19 MED ORDER — FLUTICASONE FUROATE-VILANTEROL 100-25 MCG/ACT IN AEPB: 1.0000 | INHALATION_SPRAY | Freq: Every day | RESPIRATORY_TRACT | 1 refills | Status: DC

## 2021-04-19 MED ORDER — ALBUTEROL SULFATE (2.5 MG/3ML) 0.083% IN NEBU: 2.5000 mg | INHALATION_SOLUTION | Freq: Four times a day (QID) | RESPIRATORY_TRACT | 1 refills | Status: DC | PRN

## 2021-04-19 MED ORDER — ONDANSETRON HCL 8 MG PO TABS: ORAL_TABLET | ORAL | 3 refills | Status: DC

## 2021-04-19 MED ORDER — VITAMIN D (ERGOCALCIFEROL) 1.25 MG (50000 UNIT) PO CAPS: ORAL_CAPSULE | ORAL | 3 refills | Status: DC

## 2021-04-19 MED ORDER — DIPHENOXYLATE-ATROPINE 2.5-0.025 MG PO TABS: 1.0000 | ORAL_TABLET | Freq: Three times a day (TID) | ORAL | 0 refills | Status: DC | PRN

## 2021-04-20 ENCOUNTER — Other Ambulatory Visit: Payer: Self-pay | Admitting: Pharmacist

## 2021-04-20 ENCOUNTER — Encounter: Payer: Self-pay | Admitting: Hematology

## 2021-04-20 DIAGNOSIS — C50919 Malignant neoplasm of unspecified site of unspecified female breast: Secondary | ICD-10-CM

## 2021-04-20 MED ORDER — DIPHENOXYLATE-ATROPINE 2.5-0.025 MG PO TABS: 1.0000 | ORAL_TABLET | Freq: Three times a day (TID) | ORAL | 0 refills | Status: DC | PRN

## 2021-04-20 MED ORDER — ABEMACICLIB 100 MG PO TABS: ORAL_TABLET | ORAL | 1 refills | Status: DC

## 2021-04-20 NOTE — Progress Notes (Signed)
Oral Oncology Pharmacist Encounter  Prescription refill for Verzenio (abemaciclib) sent to [facility] in error. Patient enrolled in manufacturer assistance and receives medication through [place]. Prescription redirected to [facility] for dispensing.  Leron Croak, PharmD, BCPS Hematology/Oncology Clinical Pharmacist [facility] [phone] 04/20/2021 1:18 PM

## 2021-04-22 ENCOUNTER — Other Ambulatory Visit: Payer: Self-pay

## 2021-04-22 ENCOUNTER — Other Ambulatory Visit: Payer: Self-pay | Admitting: Hematology

## 2021-04-22 DIAGNOSIS — C50919 Malignant neoplasm of unspecified site of unspecified female breast: Secondary | ICD-10-CM

## 2021-04-22 MED ORDER — ABEMACICLIB 100 MG PO TABS: ORAL_TABLET | ORAL | 1 refills | Status: DC

## 2021-04-23 ENCOUNTER — Other Ambulatory Visit: Payer: Self-pay

## 2021-04-23 ENCOUNTER — Encounter: Payer: Self-pay | Admitting: Hematology

## 2021-04-23 DIAGNOSIS — C50919 Malignant neoplasm of unspecified site of unspecified female breast: Secondary | ICD-10-CM

## 2021-04-23 MED ORDER — ABEMACICLIB 100 MG PO TABS: 100.0000 mg | ORAL_TABLET | Freq: Two times a day (BID) | ORAL | 0 refills | Status: DC

## 2021-04-30 ENCOUNTER — Other Ambulatory Visit: Payer: Self-pay

## 2021-04-30 ENCOUNTER — Ambulatory Visit (INDEPENDENT_AMBULATORY_CARE_PROVIDER_SITE_OTHER): Payer: Medicare Other | Admitting: Pulmonary Disease

## 2021-04-30 DIAGNOSIS — R0602 Shortness of breath: Secondary | ICD-10-CM | POA: Diagnosis not present

## 2021-04-30 LAB — PULMONARY FUNCTION TEST
DL/VA % pred: 87 %
DL/VA: 3.64 ml/min/mmHg/L
DLCO cor % pred: 64 %
DLCO cor: 14.69 ml/min/mmHg
DLCO unc % pred: 59 %
DLCO unc: 13.53 ml/min/mmHg
FEF 25-75 Post: 3.56 L/sec
FEF 25-75 Pre: 3.79 L/sec
FEF2575-%Change-Post: -5 %
FEF2575-%Pred-Post: 145 %
FEF2575-%Pred-Pre: 155 %
FEV1-%Change-Post: -1 %
FEV1-%Pred-Post: 94 %
FEV1-%Pred-Pre: 96 %
FEV1-Post: 2.32 L
FEV1-Pre: 2.36 L
FEV1FVC-%Change-Post: 0 %
FEV1FVC-%Pred-Pre: 111 %
FEV6-%Change-Post: -1 %
FEV6-%Pred-Post: 86 %
FEV6-%Pred-Pre: 87 %
FEV6-Post: 2.6 L
FEV6-Pre: 2.64 L
FEV6FVC-%Pred-Post: 102 %
FEV6FVC-%Pred-Pre: 102 %
FVC-%Change-Post: -1 %
FVC-%Pred-Post: 83 %
FVC-%Pred-Pre: 85 %
FVC-Post: 2.6 L
FVC-Pre: 2.64 L
Post FEV1/FVC ratio: 89 %
Post FEV6/FVC ratio: 100 %
Pre FEV1/FVC ratio: 89 %
Pre FEV6/FVC Ratio: 100 %
RV % pred: 82 %
RV: 1.69 L
TLC % pred: 81 %
TLC: 4.46 L

## 2021-04-30 NOTE — Progress Notes (Signed)
Full PFT completed today ? ?

## 2021-05-03 ENCOUNTER — Inpatient Hospital Stay: Payer: Medicare Other | Attending: Hematology

## 2021-05-03 ENCOUNTER — Inpatient Hospital Stay (HOSPITAL_BASED_OUTPATIENT_CLINIC_OR_DEPARTMENT_OTHER): Payer: Medicare Other | Admitting: Hematology

## 2021-05-03 ENCOUNTER — Inpatient Hospital Stay: Payer: Medicare Other

## 2021-05-03 ENCOUNTER — Other Ambulatory Visit: Payer: Self-pay

## 2021-05-03 VITALS — BP 133/76 | HR 96 | Temp 97.2°F | Resp 20 | Wt 214.0 lb

## 2021-05-03 DIAGNOSIS — R2689 Other abnormalities of gait and mobility: Secondary | ICD-10-CM

## 2021-05-03 DIAGNOSIS — C50919 Malignant neoplasm of unspecified site of unspecified female breast: Secondary | ICD-10-CM

## 2021-05-03 DIAGNOSIS — Z170 Estrogen receptor positive status [ER+]: Secondary | ICD-10-CM | POA: Diagnosis not present

## 2021-05-03 DIAGNOSIS — C50511 Malignant neoplasm of lower-outer quadrant of right female breast: Secondary | ICD-10-CM | POA: Insufficient documentation

## 2021-05-03 DIAGNOSIS — Z5111 Encounter for antineoplastic chemotherapy: Secondary | ICD-10-CM | POA: Diagnosis not present

## 2021-05-03 DIAGNOSIS — M48062 Spinal stenosis, lumbar region with neurogenic claudication: Secondary | ICD-10-CM

## 2021-05-03 DIAGNOSIS — C50911 Malignant neoplasm of unspecified site of right female breast: Secondary | ICD-10-CM

## 2021-05-03 LAB — CBC WITH DIFFERENTIAL/PLATELET
Abs Immature Granulocytes: 0 10*3/uL (ref 0.00–0.07)
Basophils Absolute: 0 10*3/uL (ref 0.0–0.1)
Basophils Relative: 1 %
Eosinophils Absolute: 0 10*3/uL (ref 0.0–0.5)
Eosinophils Relative: 1 %
HCT: 33.1 % — ABNORMAL LOW (ref 36.0–46.0)
Hemoglobin: 11.3 g/dL — ABNORMAL LOW (ref 12.0–15.0)
Immature Granulocytes: 0 %
Lymphocytes Relative: 67 %
Lymphs Abs: 2.2 10*3/uL (ref 0.7–4.0)
MCH: 34.8 pg — ABNORMAL HIGH (ref 26.0–34.0)
MCHC: 34.1 g/dL (ref 30.0–36.0)
MCV: 101.8 fL — ABNORMAL HIGH (ref 80.0–100.0)
Monocytes Absolute: 0.2 10*3/uL (ref 0.1–1.0)
Monocytes Relative: 5 %
Neutro Abs: 0.9 10*3/uL — ABNORMAL LOW (ref 1.7–7.7)
Neutrophils Relative %: 26 %
Platelets: 223 10*3/uL (ref 150–400)
RBC: 3.25 MIL/uL — ABNORMAL LOW (ref 3.87–5.11)
RDW: 14.4 % (ref 11.5–15.5)
WBC: 3.3 10*3/uL — ABNORMAL LOW (ref 4.0–10.5)
nRBC: 0 % (ref 0.0–0.2)

## 2021-05-03 LAB — CMP (CANCER CENTER ONLY)
ALT: 112 U/L — ABNORMAL HIGH (ref 0–44)
AST: 151 U/L — ABNORMAL HIGH (ref 15–41)
Albumin: 3.6 g/dL (ref 3.5–5.0)
Alkaline Phosphatase: 178 U/L — ABNORMAL HIGH (ref 38–126)
Anion gap: 11 (ref 5–15)
BUN: 13 mg/dL (ref 6–20)
CO2: 26 mmol/L (ref 22–32)
Calcium: 9.2 mg/dL (ref 8.9–10.3)
Chloride: 106 mmol/L (ref 98–111)
Creatinine: 1.25 mg/dL — ABNORMAL HIGH (ref 0.44–1.00)
GFR, Estimated: 51 mL/min — ABNORMAL LOW (ref >60)
Glucose, Bld: 125 mg/dL — ABNORMAL HIGH (ref 70–99)
Potassium: 3.9 mmol/L (ref 3.5–5.1)
Sodium: 143 mmol/L (ref 135–145)
Total Bilirubin: 0.7 mg/dL (ref 0.3–1.2)
Total Protein: 7.7 g/dL (ref 6.5–8.1)

## 2021-05-03 MED ORDER — FULVESTRANT 250 MG/5ML IM SOSY
500.0000 mg | PREFILLED_SYRINGE | Freq: Once | INTRAMUSCULAR | Status: AC
  Administered 2021-05-03: 14:00:00 500 mg via INTRAMUSCULAR
  Filled 2021-05-03: qty 10

## 2021-05-03 NOTE — Progress Notes (Signed)
HEMATOLOGY/ONCOLOGY CLINIC NOTE  Date of Service: .05/03/2021   Patient Care Team: [facility] as PCP - General Everardo Beals, NP as Nurse Practitioner [facility] CHIEF COMPLAINTS/PURPOSE OF CONSULTATION:  -f/u for metastatic breast cancer   HISTORY OF PRESENTING ILLNESS:  Brandy [name] is a wonderful 55 y.o. female who has been referred to [place] by [facility], [facility]* for evaluation and management of her suspected breast cancer.   The pt reports that she was having increasing low back pain on her right side that would not resolve, so she went to the ED. This pain has not resolved. She notes pain in her hips and knees.   Of note prior to the patient's visit today, pt has had an abdominal limited right upper ultrasound completed on 02/18/2019 with results revealing "Probable hepatic steatosis. No other abnormality seen in the right upper quadrant of the abdomen."  Pt had abdomen and pelvis CT completed on 02/18/2019 with results revealing "5cm spiculated soft tissue mass in inferior right breast, highly suspicious for primary breast carcinoma. No acute findings or metastatic disease within the abdomen or pelvis. Multiple small pulmonary nodules in both lung bases, consistent with pulmonary metastases. 4.5 cm uterine fibroid."  Most recent lab results (02/18/2019) of CBC is as follows: all values are WNL except for glucose at 121, calcium at 8.7, AST at 49, and ALT at 53.  On review of systems, pt reports pedal edema and denies chest pain, shortness of breath, weight loss, and any other symptoms.  Her last known mammography was on 06/18/2012.   On Social Hx the pt reports that she is a smoker and she smokes about half a pack per day. She does not drink alcohol. She has three children: age 84, 51, and 29.  On Family Hx the pt reports lung cancer. Maternal Cousin passed from stomach cancer at the age of 75.    INTERVAL HISTORY:  Brandy [name] is a 55 y.o. female here for follow-up of her metastatic breast cancer.  She notes no acute new symptoms. She reports she has been falling frequently.  She notes her chronic back pain issues and resultant radiculopathy are significantly affecting her balance and quality of life.  She is following with pain medicine clinic to manage her chronic back pains.  We discussed that she might want to see an orthopedic doctor again as well. She was encouraged to use her walker to reduce the risk of falls.  She also reports shortness of breath and tremors. Had a pulmonary function test on 04/30/2021. She has not been eating properly and eats 1-2 times a day. She drinks mostly protein shakes. She is also drinking a lot of water and the diarrhea has resolved. She also normally forgets to eat and is not sleeping properly.   No new breast pain or lumps.   She is tolerating the 100 mg verzenio.  P.o. twice daily No issues with tolerating her Faslodex. She has not received the flu or Covid-19 booster vaccines and was encouraged to do so.  MEDICAL HISTORY:  Past Medical History:  Diagnosis Date   Acute pansinusitis 08/02/2017   Anxiety    Arthritis    ASD (atrial septal defect)    s/p closure with Amplatzer device 10/05/04 (Dr. Myriam Jacobson, [place]) 10/05/04   Cancer [facility])    Cataract    Dyspnea    GERD (gastroesophageal reflux disease)    Heart murmur    no  longer heard   History of hiatal hernia    Legally blind in right eye, as defined in [place]    Lumbar herniated disc    PONV (postoperative nausea and vomiting)    Sciatica   Uterine Fibroids  SURGICAL HISTORY: Past Surgical History:  Procedure Laterality Date   ABLATION     BREAST LUMPECTOMY WITH RADIOACTIVE SEED AND SENTINEL LYMPH NODE BIOPSY Right 11/23/2020   Procedure: RIGHT BREAST LUMPECTOMY WITH RADIOACTIVE SEED AND RIGHT AXILLARY SENTINEL LYMPH NODE BIOPSY;  Surgeon: Rolm Bookbinder, MD;  Location: [place];  Service:  General;  Laterality: Right;   BREAST SURGERY Bilateral 2011   Breast Reduction Surgery   BUNIONECTOMY     CARDIAC CATHETERIZATION     10/05/04 [facility]): LM < 25%, otherwise normal coronaries. No pulmonary HTN, Mildly enlarged RV. Secundum ASD s/p closure.   CARDIAC SURGERY     CATARACT EXTRACTION     CLEFT PALATE REPAIR     s/p cleft lip and palate repair   EYE SURGERY Right 2019   right eye removed   LUMBAR LAMINECTOMY/DECOMPRESSION MICRODISCECTOMY Left 05/23/2016   Procedure: LEFT L4-L5 LATERAL RECESS DECOMPRESSION WITH CENTRAL AND RIGHT DECOMPRESSION VIA LEFT SIDE;  Surgeon: [name], MD;  Location: [place];  Service: Orthopedics;  Laterality: Left;   LUMBAR LAMINECTOMY/DECOMPRESSION MICRODISCECTOMY Left 05/23/2016   Procedure: LUMBAR LAMINECTOMY/DECOMPRESSION MICRODISCECTOMY Lumbar five - Sacral One 1 LEVEL;  Surgeon: [name], MD;  Location: [place];  Service: Orthopedics;  Laterality: Left;   REDUCTION MAMMAPLASTY  2011   SHOULDER INJECTION Left 05/23/2016   Procedure: SHOULDER INJECTION;  Surgeon: [name], MD;  Location: [place];  Service: Orthopedics;  Laterality: Left;  band-aid per pa-c   TRANSTHORACIC ECHOCARDIOGRAM     12/15/05 [facility]): Mild LVH, EF > 95%, grade 1 diastolic dysfunction, Trivial MR/PR/TR.   TUBAL LIGATION    Endometrial ablation 2003 Breast Reduction, bilateral 2011  SOCIAL HISTORY: Social History   Socioeconomic History   Marital status: Married    Spouse name: Not on file   Number of children: 3   Years of education: Not on file   Highest education level: Not on file  Occupational History   Not on file  Tobacco Use   Smoking status: Former    Packs/day: 0.25    Years: 25.00    Pack years: 6.25    Types: Cigarettes    Quit date: 02/04/2018    Years since quitting: 3.2   Smokeless tobacco: Never  Vaping Use   Vaping Use: Never used  Substance and Sexual Activity   Alcohol use: No   Drug use: No   Sexual activity: Not on file  Other  Topics Concern   Not on file  Social History Narrative   Not on file   Social Determinants of Health   Financial Resource Strain: Not on file  Food Insecurity: Not on file  Transportation Needs: Not on file  Physical Activity: Not on file  Stress: Not on file  Social Connections: Not on file  Intimate Partner Violence: Not on file    FAMILY HISTORY: Family History  Problem Relation Age of Onset   Diabetes Mother    High blood pressure Mother    Cancer Father        Lung   Colon cancer Neg Hx    Colon polyps Neg Hx    Esophageal cancer Neg Hx    Rectal cancer Neg Hx    Stomach cancer Neg Hx  ALLERGIES:  is allergic to zithromax [azithromycin] and psyllium.  MEDICATIONS:  Current Outpatient Medications  Medication Sig Dispense Refill   abemaciclib (VERZENIO) 100 MG tablet Take 1 tablet (100 mg total) by mouth 2 (two) times daily. Swallow tablets whole. Do not chew, crush, or split tablets before swallowing. 56 tablet 0   acetaminophen (TYLENOL) 500 MG tablet Take 500-1,000 mg by mouth every 6 (six) hours as needed for moderate pain or headache. (Patient not taking: Reported on 03/04/2021)     albuterol (PROVENTIL) (2.5 MG/3ML) 0.083% nebulizer solution Take 3 mLs (2.5 mg total) by nebulization every 6 (six) hours as needed for wheezing or shortness of breath. 180 mL 1   albuterol (VENTOLIN HFA) 108 (90 Base) MCG/ACT inhaler Inhale 2 puffs into the lungs every 6  hours as needed for wheezing or shortness of breath. 18 g 6   ALPRAZolam (XANAX) 1 MG tablet Take 1 mg by mouth at bedtime.     amitriptyline (ELAVIL) 75 MG tablet Take 75 mg by mouth at bedtime.      Carboxymethylcellul-Glycerin (LUBRICATING EYE DROPS OP) Place 1 drop into the left eye 4 (four) times daily.     clindamycin (CLINDAGEL) 1 % gel Apply topically 2 (two) times daily. To hidradenitis lesions in groin and armpits (Patient not taking: Reported on 03/04/2021) 30 g 2   Cyanocobalamin (B-12 PO) Take 1 tablet  by mouth daily.     diphenoxylate-atropine (LOMOTIL) 2.5-0.025 MG tablet Take 1 tablet by mouth 3 (three) times daily as needed for diarrhea or loose stools. 30 tablet 0   fluticasone furoate-vilanterol (BREO ELLIPTA) 100-25 MCG/ACT AEPB Inhale 1 puff into the lungs daily. 180 each 1   HYDROcodone-acetaminophen (NORCO/VICODIN) 5-325 MG tablet Take 1 tablet by mouth every 4 (four) hours as needed for moderate pain or severe pain. (Patient not taking: Reported on 03/04/2021) 10 tablet 0   loperamide (IMODIUM) 2 MG capsule Take 2-4 mg by mouth 4 (four) times daily as needed for diarrhea or loose stools.     Magnesium 500 MG TABS Take 1 tablet (500 mg total) by mouth in the morning and at bedtime. (Patient not taking: Reported on 03/04/2021) 60 tablet 1   Multiple Vitamin (MULTIVITAMIN WITH MINERALS) TABS tablet Take 1 tablet by mouth daily. (Patient not taking: Reported on 03/04/2021)     omeprazole (PRILOSEC OTC) 20 MG tablet Take 20 mg by mouth daily.     ondansetron (ZOFRAN) 8 MG tablet TAKE 1 TABLET(8 MG) BY MOUTH EVERY 8 HOURS AS NEEDED FOR NAUSEA OR VOMITING 30 tablet 3   Saccharomyces boulardii 400 MG CAPS Take 1 capsule by mouth in the morning and at bedtime. (Patient not taking: Reported on 03/04/2021) 60 capsule 0   Travoprost, BAK Free, (TRAVATAN) 0.004 % SOLN ophthalmic solution Place 1 drop into the left eye at bedtime.     Vitamin D, Ergocalciferol, (DRISDOL) 1.25 MG (50000 UNIT) CAPS capsule TAKE 1 CAPSULE ONCE PER WEEK 12 capsule 3   White Petrolatum-Mineral Oil (LUBRICANT EYE NIGHTTIME) OINT Place 1 drop into the left eye at bedtime.     No current facility-administered medications for this visit.    REVIEW OF SYSTEMS:    (+) Falls (+) Myalgias  (+) Arthralgias (+) Tremors (+) Shortness of breath (+) Dizziness  .10 Point review of Systems was done is negative except as noted above.   PHYSICAL EXAMINATION: ECOG FS:1 - Symptomatic but completely ambulatory  There were no  vitals filed for this visit.  Wt Readings from Last 3 Encounters:  03/08/21 220 lb (99.8 kg)  03/04/21 219 lb 3.2 oz (99.4 kg)  12/25/20 219 lb 9 oz (99.6 kg)   There is no height or weight on file to calculate BMI.   Marland Kitchen GENERAL:alert, in no acute distress and comfortable SKIN: no acute rashes, no significant lesions EYES: conjunctiva are pink and non-injected, sclera anicteric, right prosthetic eye. OROPHARYNX: MMM, no exudates, no oropharyngeal erythema or ulceration NECK: supple, no JVD LYMPH:  no palpable lymphadenopathy in the cervical, axillary or inguinal regions LUNGS: clear to auscultation b/l with normal respiratory effort HEART: regular rate & rhythm ABDOMEN:  normoactive bowel sounds , non tender, not distended. Extremity: no pedal edema PSYCH: alert & oriented x 3 with fluent speech NEURO: no focal motor/sensory deficits   LABORATORY DATA:  I have reviewed the data as listed . CBC Latest Ref Rng & Units 05/03/2021 03/08/2021 01/22/2021  WBC 4.0 - 10.5 K/uL 3.3(L) 4.7 4.1  Hemoglobin 12.0 - 15.0 g/dL 11.3(L) 11.1(L) 10.7(L)  Hematocrit 36.0 - 46.0 % 33.1(L) 33.9(L) 32.6(L)  Platelets 150 - 400 K/uL 223 304 218     CMP Latest Ref Rng & Units 05/03/2021 03/08/2021 01/22/2021  Glucose 70 - 99 mg/dL 125(H) 101(H) 113(H)  BUN 6 - 20 mg/dL _0 Creatinine 0.44 - 1.00 mg/dL 1.25(H) 1.26(H) 1.43(H)  Sodium 135 - 145 mmol/L 143 144 143  Potassium 3.5 - 5.1 mmol/L 3.9 4.1 3.9  Chloride 98 - 111 mmol/L 106 104 102  CO2 22 - 32 mmol/L _1 Calcium 8.9 - 10.3 mg/dL 9.2 9.3 9.2  Total Protein 6.5 - 8.1 g/dL 7.7 7.7 7.7  Total Bilirubin 0.3 - 1.2 mg/dL 0.7 0.4 0.6  Alkaline Phos 38 - 126 U/L 178(H) 138(H) 156(H)  AST 15 - 41 U/L 151(H) 55(H) 63(H)  ALT 0 - 44 U/L 112(H) 47(H) 64(H)      Component     Latest Ref Rng & Units 03/08/2019 03/22/2019  FSH     mIU/mL 65.5   LH     mIU/mL 46.6   Estrogen     pg/mL 82   Progesterone     ng/mL <0.1   Estradiol      pg/mL  <5.0        RADIOGRAPHIC STUDIES: I have personally reviewed the radiological images as listed and agreed with the findings in the report. No results found. 09/02/2020 Surgical Pathology     ASSESSMENT & PLAN:   LEE-ANN GAL is a 55 y.o. female with:  1. Metastatic breast cancer ER+/PRneg/Her2 neg  02/28/2019 neck CT with results revealing "negative for mass or adenopathy in the neck."  03/04/2019 head MRI with results revealing "Negative for metastatic disease.  No acute abnormality in the brain."  2. Likely Pulmonary metastases  02/18/2019 chest and abdomen with results revealing "5cm spiculated soft tissue mass in inferior right breast, highly suspicious for primary breast carcinoma. No acute findings or metastatic disease within the abdomen or pelvis. Multiple small pulmonary nodules in both lung bases, consistent with pulmonary metastases. 4.5 cm uterine fibroid."  02/28/2019 C/A/P CT with results revealing "Irregular solid 5.0 cm right breast mass, suspicious for primary right breast malignancy. Innumerable solid pulmonary nodules scattered throughout both lungs, compatible with pulmonary metastases. No evidence of metastatic disease in the abdomen, pelvis or skeleton. Mildly enlarged and probably myomatous uterus. Simple 1.4 cm left adnexal cyst requires no follow-up. This recommendation follows ACR consensus guidelines: White  Paper of the ACR Incidental Findings Committee II on Adnexal Findings. J Am Coll Radiol [phone]. Aortic Atherosclerosis (ICD10-I70.0)."  NUCLEAR MEDICINE WHOLE BODY BONE SCAN completed on 03/19/2019 with results revealing "1. No scintigraphic evidence skeletal metastasis. 2. Degenerative bone disease in the posterior elements of the upper and mid lumbar spine."   04/09/2019 Bone Density ([id number]) which revealed "The BMD measured at Femur Neck Right is 1.153 g/cm2 with a T-score of 0.8. This patient is considered normal according  to [facility]) criteria. Lumbar spine was not utilized due to advanced degenerative changes. The scan quality is good. Femur Neck Right 04/09/2019 54.7 Normal 0.8 1.153 g/cm2. Left Forearm Radius 33% 04/09/2019 54.7 Normal 0.8 0.949 g/cm2."  08/04/2019 CT Angio Chest ([id number]) revealed "1. No lobar or central pulmonary embolus detected. Exam is limited secondary to respiratory motion. 2. Mild ground-glass attenuation may represent mild pneumonitis or areas of air trapping. 3. Signs of atrial septal closure. 4. Decrease in size and number of bilateral pulmonary nodules, marked response noted on today's exam with the only nodule remaining near a cm in the right upper lobe and the smaller nodules that were present on the previous examination throughout the chest no longer measurable though the lower lobes are limited by respiratory motion. 5. Decreased size of right breast mass. 6. Probable hepatic steatosis."  S/p rt breast lumpectomy on 6/20 -- no residual carcinoma on pathology.  3. Abnormal Liver function test -- previous? Fatty liver-fluctuating liver function test worse today. 07/03/2019 [place] Abd ([id number]) revealed "1. No acute findings. Normal gallbladder. No bile duct dilation. 2. Significant increased liver parenchymal echogenicity consistent with extensive hepatic steatosis."  PLAN: -Discussed pt labwork today, 05/03/2021--CBC stable hemoglobin of 11.3, WBC count of 3.3 k with ANC of 900 and platelet counts of 223k. -CMP shows stable creatinine of 1.25, elevation of her AST to 151 and ALT to 112 (patient has had chronically elevated LFTs which fluctuate.  Had gotten much better when she was on a plant-based diet and had cleaned up her diet to address her fatty liver). -We will need to monitor her LFTs closely for further worsening.  She was recommended to withhold hepatotoxic medications. -Continue follow-up with PCP/orthopedics and pain clinic for management of her  chronic back pain issues and balance issues. -Will continue Faslodex at 557m q4weeks. -Continue 100 mg Verzenio BID. -Continue Vitamin D. -We will need to discuss bone density testing if not done in the last 2 years.  Will discuss on next visit. -She was recommended to get her flu shot and stay up to speed with her COVID-19 booster vaccine. -Follow-up with pulmonary for interpretation of her PFTs.  FOLLOW UP: Please schedule labs and Faslodex injections monthly x 6 MD visit in 2 months. Next MMG in march 2023   . The total time spent in the appointment was 20 minutes and more than 50% was on counseling and direct patient cares.   All of the patient's questions were answered with apparent satisfaction. The patient knows to call the clinic with any problems, questions or concerns.   I,Zite Okoli,acting as a sEducation administratorfor GSullivan Lone MD.,have documented all relevant documentation on the behalf of GSullivan Lone MD,as directed by  GSullivan Lone MD while in the presence of GSullivan Lone MD.    .I have reviewed the above documentation for accuracy and completeness, and I agree with the above.  GSullivan LoneMD MSeatonvilleAAHIVMS SLaredo Medical CenterCMid Coast HospitalHematology/Oncology Physician CMinneapolis Va Medical Center

## 2021-05-04 ENCOUNTER — Telehealth: Payer: Self-pay | Admitting: Hematology

## 2021-05-04 ENCOUNTER — Other Ambulatory Visit: Payer: Self-pay

## 2021-05-04 DIAGNOSIS — C50919 Malignant neoplasm of unspecified site of unspecified female breast: Secondary | ICD-10-CM

## 2021-05-04 MED ORDER — VITAMIN D (ERGOCALCIFEROL) 1.25 MG (50000 UNIT) PO CAPS: ORAL_CAPSULE | ORAL | 3 refills | Status: DC

## 2021-05-04 MED ORDER — LOPERAMIDE HCL 2 MG PO CAPS: 2.0000 mg | ORAL_CAPSULE | Freq: Four times a day (QID) | ORAL | 1 refills | Status: DC | PRN

## 2021-05-04 NOTE — Telephone Encounter (Signed)
Scheduled follow-up appointments per 11/28 los. Patient is aware.

## 2021-05-06 ENCOUNTER — Ambulatory Visit: Payer: Medicare Other | Admitting: Pulmonary Disease

## 2021-05-07 ENCOUNTER — Encounter: Payer: Self-pay | Admitting: Pulmonary Disease

## 2021-05-09 ENCOUNTER — Encounter: Payer: Self-pay | Admitting: Hematology

## 2021-05-10 ENCOUNTER — Other Ambulatory Visit: Payer: Self-pay | Admitting: *Deleted

## 2021-05-10 DIAGNOSIS — C50919 Malignant neoplasm of unspecified site of unspecified female breast: Secondary | ICD-10-CM

## 2021-05-10 MED ORDER — LOPERAMIDE HCL 2 MG PO CAPS: 2.0000 mg | ORAL_CAPSULE | Freq: Four times a day (QID) | ORAL | 1 refills | Status: AC | PRN

## 2021-05-14 ENCOUNTER — Telehealth: Payer: Self-pay | Admitting: General Practice

## 2021-05-14 ENCOUNTER — Telehealth: Payer: Self-pay

## 2021-05-14 ENCOUNTER — Encounter: Payer: Self-pay | Admitting: Pulmonary Disease

## 2021-05-14 ENCOUNTER — Other Ambulatory Visit: Payer: Self-pay

## 2021-05-14 ENCOUNTER — Telehealth (INDEPENDENT_AMBULATORY_CARE_PROVIDER_SITE_OTHER): Payer: Medicare Other | Admitting: Pulmonary Disease

## 2021-05-14 DIAGNOSIS — C50919 Malignant neoplasm of unspecified site of unspecified female breast: Secondary | ICD-10-CM

## 2021-05-14 DIAGNOSIS — R942 Abnormal results of pulmonary function studies: Secondary | ICD-10-CM | POA: Diagnosis not present

## 2021-05-14 DIAGNOSIS — R0602 Shortness of breath: Secondary | ICD-10-CM | POA: Diagnosis not present

## 2021-05-14 MED ORDER — FLUTICASONE-SALMETEROL 250-50 MCG/ACT IN AEPB: 1.0000 | INHALATION_SPRAY | Freq: Two times a day (BID) | RESPIRATORY_TRACT | 5 refills | Status: DC

## 2021-05-14 NOTE — Progress Notes (Addendum)
Virtual Visit via Video Note  I connected with Brandy [name] on 05/18/21 at  2:00 PM EST by a video enabled telemedicine application and verified that I am speaking with the correct person using two identifiers.  Location: Patient: Home Provider: [facility]   I discussed the limitations of evaluation and management by telemedicine and the availability of in person appointments. The patient expressed understanding and agreed to proceed.  I discussed the assessment and treatment plan with the patient. The patient was provided an opportunity to ask questions and all were answered. The patient agreed with the plan and demonstrated an understanding of the instructions.   The patient was advised to call back or seek an in-person evaluation if the symptoms worsen or if the condition fails to improve as anticipated.  I provided 33 minutes of non-face-to-face time during this encounter.   Brandy Degroote Rodman Pickle, MD  Subjective:   PATIENT ID: Brandy [name] GENDER: female DOB: 1965-09-03, MRN: [id number]   HPI  Chief Complaint  Patient presents with   Follow-up    PFT review     Reason for Visit: Follow-up  [name] is a 55 year old female former smoker with metastatic breast cancer, obesity, chronic diastolic heart failure, chronic pain, anxiety and insomnia who presents for follow-up  Synopsis: Initially referred for evaluation of shortness of breath. PFTs with mild restrictive defect and reduced DLCO. On verzenio and faslodex for metastatic breast cancer 2021 - Methacholine challenge negative. Prior pulmonary nodules decreased in size since 02/2019, likely representing mets post-treatment. CT with no ILD. 2022 - S/p right lumpectomy in June with no breast cancer progression. Worsening dyspnea with unclear cause.   03/04/21 In the last six months, she reports shortness of breath that has gradually worsened. Some coughing, nonproductive. No wheezing. Will  get lightheaded and dizzy with activity. No significant respiratory infections since our last visit. She reports possible aspiration when eating/drinking. She has reflux as well that intermittently occurs. She is enrolled in [employer] and states that the chair exercises are sometimes difficult.  05/14/21 Since our last visit she has been compliant with her [employer] however has not noticed any improvement. However her albuterol nebulizer helps and uses 1-2 times daily.. She still has shortness of breath and nonproductive cough. Her throat will burn at times. She is taking prilosec 20 mg daily.  Social History: Quit smoking in 2020. 1/4 ppd x 25 years.    Past Medical History:  Diagnosis Date   Acute pansinusitis 08/02/2017   Anxiety    Arthritis    ASD (atrial septal defect)    s/p closure with Amplatzer device 10/05/04 (Dr. Myriam Jacobson, [facility]) 10/05/04   Cancer ([place])    Cataract    Dyspnea    GERD (gastroesophageal reflux disease)    Heart murmur    no longer heard   History of hiatal hernia    Legally blind in right eye, as defined in [place]    Lumbar herniated disc    PONV (postoperative nausea and vomiting)    Sciatica      Allergies  Allergen Reactions   Zithromax [Azithromycin] Shortness Of Breath and Itching    TOTAL BODY ITCHING [EVEN SOLES OF FEET] WHEEZING    Psyllium Nausea And Vomiting     Outpatient Medications Prior to Visit  Medication Sig Dispense Refill   abemaciclib (VERZENIO) 100 MG tablet Take 1 tablet (100 mg total) by mouth 2 (two) times daily. Swallow tablets whole.  Do not chew, crush, or split tablets before swallowing. 56 tablet 0   acetaminophen (TYLENOL) 500 MG tablet Take 500-1,000 mg by mouth every 6 (six) hours as needed for moderate pain or headache.     albuterol (PROVENTIL) (2.5 MG/3ML) 0.083% nebulizer solution Take 3 mLs (2.5 mg total) by nebulization every 6 (six) hours as needed for wheezing or shortness of breath. 180 mL 1   ALPRAZolam  (XANAX) 1 MG tablet Take 1 mg by mouth at bedtime.     amitriptyline (ELAVIL) 75 MG tablet Take 75 mg by mouth at bedtime.      Carboxymethylcellul-Glycerin (LUBRICATING EYE DROPS OP) Place 1 drop into the left eye 4 (four) times daily.     Cyanocobalamin (B-12 PO) Take 1 tablet by mouth daily.     diphenoxylate-atropine (LOMOTIL) 2.5-0.025 MG tablet Take 1 tablet by mouth 3 (three) times daily as needed for diarrhea or loose stools. 30 tablet 0   fluticasone furoate-vilanterol (BREO ELLIPTA) 100-25 MCG/ACT AEPB Inhale 1 puff into the lungs daily. 180 each 1   loperamide (IMODIUM) 2 MG capsule Take 1-2 capsules (2-4 mg total) by mouth 4 (four) times daily as needed for diarrhea or loose stools. 30 capsule 1   Multiple Vitamin (MULTIVITAMIN WITH MINERALS) TABS tablet Take 1 tablet by mouth daily.     omeprazole (PRILOSEC OTC) 20 MG tablet Take 20 mg by mouth daily.     ondansetron (ZOFRAN) 8 MG tablet TAKE 1 TABLET(8 MG) BY MOUTH EVERY 8 HOURS AS NEEDED FOR NAUSEA OR VOMITING 30 tablet 3   Travoprost, BAK Free, (TRAVATAN) 0.004 % SOLN ophthalmic solution Place 1 drop into the left eye at bedtime.     Vitamin D, Ergocalciferol, (DRISDOL) 1.25 MG (50000 UNIT) CAPS capsule TAKE 1 CAPSULE ONCE PER WEEK 12 capsule 3   White Petrolatum-Mineral Oil (LUBRICANT EYE NIGHTTIME) OINT Place 1 drop into the left eye at bedtime.     albuterol (VENTOLIN HFA) 108 (90 Base) MCG/ACT inhaler Inhale 2 puffs into the lungs every 6  hours as needed for wheezing or shortness of breath. 18 g 6   clindamycin (CLINDAGEL) 1 % gel Apply topically 2 (two) times daily. To hidradenitis lesions in groin and armpits 30 g 2   HYDROcodone-acetaminophen (NORCO/VICODIN) 5-325 MG tablet Take 1 tablet by mouth every 4 (four) hours as needed for moderate pain or severe pain. 10 tablet 0   Magnesium 500 MG TABS Take 1 tablet (500 mg total) by mouth in the morning and at bedtime. (Patient not taking: Reported on 03/04/2021) 60 tablet 1    Saccharomyces boulardii 400 MG CAPS Take 1 capsule by mouth in the morning and at bedtime. (Patient not taking: Reported on 03/04/2021) 60 capsule 0   No facility-administered medications prior to visit.    Review of Systems  Constitutional:  Negative for chills, diaphoresis, fever, malaise/fatigue and weight loss.  HENT:  Negative for congestion.   Respiratory:  Positive for cough and shortness of breath. Negative for hemoptysis, sputum production and wheezing.   Cardiovascular:  Negative for chest pain, palpitations and leg swelling.  Gastrointestinal:  Positive for heartburn. Negative for nausea and vomiting.   Objective:   There were no vitals filed for this visit.     Physical Exam: General: Well-appearing, no acute distress HENT: [place], AT, OP clear, MMM Eyes: Right eye closed, EOMI, no scleral icterus Respiratory: No respiratory distress Extremities: Per patient no edema,-tenderness Neuro: AAO x4, CNII-XII grossly intact  Data Reviewed:  Imaging:  CTA 10/30/19 - No pulmonary embolism. RUL 10x70mm, 2mm, unchanged. RLL nodule 66mm new CT Chest 05/21/20 - No evidence of ILD. 1.4 x 1.0 x 0.8 cm aggressive appearing nodule near the apex of the right upper lobe PET 08/07/20 - 8 mm irregular nodule in right apex improved compared to CT 2020  PFT: 12/18/19 FVC 3.13 (83%) FEV1 2.49 (92%) Ratio 88  TLC 78% DLCO 58% Interpretation: Mild restrictive defect with moderate reduction in gas exchange  02/17/20 Negative methacholine challenge  04/30/21 FVC 2.64 (85%) FEV1 2.32 (94%) Ratio 89  TLC 80% DLCO 59% Interpretation: Isolated moderate reduction in gas exchange  Echocardiogram: 09/2019 Normal EF, grade I DD, mild MR.  CBC    Component Value Date/Time   WBC 3.3 (L) 05/03/2021 1108   RBC 3.25 (L) 05/03/2021 1108   HGB 11.3 (L) 05/03/2021 1108   HGB 11.1 (L) 05/21/2020 1154   HCT 33.1 (L) 05/03/2021 1108   PLT 223 05/03/2021 1108   PLT 223 05/21/2020 1154   MCV 101.8 (H)  05/03/2021 1108   MCH 34.8 (H) 05/03/2021 1108   MCHC 34.1 05/03/2021 1108   RDW 14.4 05/03/2021 1108   LYMPHSABS 2.2 05/03/2021 1108   MONOABS 0.2 05/03/2021 1108   EOSABS 0.0 05/03/2021 1108   BASOSABS 0.0 05/03/2021 1108   Absolute eos  01/22/21 - 300 05/03/21 - 0    Assessment & Plan:   Discussion: 54 year old female former smoker (6 pack-years) with metastatic breast cancer s/p right lumpectomy with pulmonary mets who presents for follow-up. Persistent shortness of breath. Reviewed PFT from 04/2021 with restrictive defect resolved/borderline. DLCO remains moderately reduced. ?[place] however no clinical symptoms except for dyspnea suggesting this. Will obtain up-to-date echo for evaluation. Will increase ICS strength of inhaler to see if this helps otherwise may discontinue in the future if ineffective.  Shortness of breath - persistent --Prior methacholine challenge which was negative for asthma --STOP Breo due to ineffectiveness --START Advair 250-50 mcg ONE puff TWICE a day --CONTINUE Albuterol AS NEEDED for shortness of breath --ORDER echocardiogram  Restrictive defect on PFT - resolved Isolated moderate DLCO --Reviewed PFTs today  Health Maintenance Immunization History  Administered Date(s) Administered   Influenza Inj Mdck Quad Pf 04/25/2019   Influenza,inj,Quad PF,6+ Mos 02/24/2020   Influenza,inj,quad, With Preservative 04/25/2019   Moderna Sars-Covid-2 Vaccination 06/29/2019, 08/03/2019   Pneumococcal Conjugate-13 05/29/2019   Pneumococcal Polysaccharide-23 07/29/2019   CT Lung Screen - not qualified  Orders Placed This Encounter  Procedures   ECHOCARDIOGRAM COMPLETE    Standing Status:   Future    Standing Expiration Date:   05/14/2022    Order Specific Question:   Where should this test be performed    Answer:   [facility])    Order Specific Question:   Does the patient weigh less than or greater than 250 lbs?    Answer:   Patient  weighs less than 250 lbs    Order Specific Question:   Perflutren DEFINITY (image enhancing agent) should be administered unless hypersensitivity or allergy exist    Answer:   Administer Perflutren    Order Specific Question:   Is a special reader required? (athlete or structural heart)    Answer:   No    Order Specific Question:   Does this study need to be read by the Structural team/Level 3 readers?    Answer:   No    Order Specific Question:   Reason for exam-Echo  Answer:   Other-Full Diagnosis List    Order Specific Question:   Full ICD-10/Reason for Exam    Answer:   Shortness of breath [786.05.ICD-9-CM]    Order Specific Question:   Release to patient    Answer:   Immediate   Meds ordered this encounter  Medications   fluticasone-salmeterol (ADVAIR DISKUS) 250-50 MCG/ACT AEPB    Sig: Inhale 1 puff into the lungs in the morning and at bedtime.    Dispense:  60 each    Refill:  5    Return in about 1 month (around 06/14/2021).   I have spent a total time of 33-minutes on the day of the appointment reviewing prior documentation, coordinating care and discussing medical diagnosis and plan with the patient/family. Past medical history, allergies, medications were reviewed. Pertinent imaging, labs and tests included in this note have been reviewed and interpreted independently by me.  [place], MD [facility] 05/14/2021 2:18 PM  Office Number [phone]

## 2021-05-14 NOTE — Patient Instructions (Addendum)
Shortness of breath --Prior methacholine challenge which was negative for asthma --STOP Breo due to ineffectiveness --START Advair 250-50 mcg ONE puff TWICE a day --CONTINUE Albuterol AS NEEDED for shortness of breath --ORDER echocardiogram  Follow-up with me in 1 month in-person

## 2021-05-14 NOTE — Telephone Encounter (Signed)
Contacted pt regarding Mychart message. Per Dr Irene Limbo he will fill pt's prescriptions for 1 month until she finds a new PCP. Pt confirmed she was actively working on a new PCP that SW had given her some names. Pt acknowledged and verbalized understanding of conversation.

## 2021-05-14 NOTE — Telephone Encounter (Signed)
[place] CSW Progress Notes  Patient wants help finding a new PCP, provided contact information for [facility] referrals, [place] and advised her that many providers take her Medicare Advantage plan.  She is concerned she will run out of medications - advised her that is she cannot schedule timely appointment, she may want to use an Urgent Care clinic.  Edwyna Shell, LCSW Clinical Social Worker Phone:  [phone]

## 2021-05-17 ENCOUNTER — Encounter: Payer: Self-pay | Admitting: Hematology

## 2021-05-18 ENCOUNTER — Other Ambulatory Visit: Payer: Self-pay | Admitting: Hematology

## 2021-05-18 DIAGNOSIS — C50919 Malignant neoplasm of unspecified site of unspecified female breast: Secondary | ICD-10-CM

## 2021-05-21 ENCOUNTER — Encounter: Payer: Self-pay | Admitting: Hematology

## 2021-05-21 MED ORDER — AMITRIPTYLINE HCL 75 MG PO TABS: 75.0000 mg | ORAL_TABLET | Freq: Every day | ORAL | 0 refills | Status: DC

## 2021-05-21 MED ORDER — ALPRAZOLAM 1 MG PO TABS: 1.0000 mg | ORAL_TABLET | Freq: Every day | ORAL | 0 refills | Status: DC

## 2021-05-24 ENCOUNTER — Encounter: Payer: Self-pay | Admitting: Hematology

## 2021-05-28 ENCOUNTER — Encounter: Payer: Self-pay | Admitting: Family

## 2021-05-28 ENCOUNTER — Other Ambulatory Visit: Payer: Self-pay

## 2021-05-28 ENCOUNTER — Ambulatory Visit (INDEPENDENT_AMBULATORY_CARE_PROVIDER_SITE_OTHER): Payer: Medicare Other | Admitting: Family

## 2021-05-28 VITALS — BP 118/62 | HR 108 | Temp 97.3°F | Resp 18 | Ht 67.0 in | Wt 211.4 lb

## 2021-05-28 DIAGNOSIS — H6121 Impacted cerumen, right ear: Secondary | ICD-10-CM | POA: Diagnosis not present

## 2021-05-28 DIAGNOSIS — E559 Vitamin D deficiency, unspecified: Secondary | ICD-10-CM

## 2021-05-28 DIAGNOSIS — M48062 Spinal stenosis, lumbar region with neurogenic claudication: Secondary | ICD-10-CM | POA: Diagnosis not present

## 2021-05-28 DIAGNOSIS — K760 Fatty (change of) liver, not elsewhere classified: Secondary | ICD-10-CM

## 2021-05-28 DIAGNOSIS — R000 Tachycardia, unspecified: Secondary | ICD-10-CM

## 2021-05-28 DIAGNOSIS — Z7689 Persons encountering health services in other specified circumstances: Secondary | ICD-10-CM

## 2021-05-28 DIAGNOSIS — R197 Diarrhea, unspecified: Secondary | ICD-10-CM

## 2021-05-28 DIAGNOSIS — I10 Essential (primary) hypertension: Secondary | ICD-10-CM

## 2021-05-28 DIAGNOSIS — I951 Orthostatic hypotension: Secondary | ICD-10-CM

## 2021-05-28 DIAGNOSIS — K219 Gastro-esophageal reflux disease without esophagitis: Secondary | ICD-10-CM

## 2021-05-28 DIAGNOSIS — E782 Mixed hyperlipidemia: Secondary | ICD-10-CM

## 2021-05-28 MED ORDER — DEBROX 6.5 % OT SOLN: 5.0000 [drp] | Freq: Two times a day (BID) | OTIC | 0 refills | Status: DC

## 2021-05-28 NOTE — Progress Notes (Signed)
Provider: Webb Silversmith Nadalee Neiswender FNP-C   [facility], [facility]  Patient Care Team: [place], [facility] as PCP - General Everardo Beals, NP as Nurse Practitioner  Extended Emergency Contact Information Primary Emergency Contact: [place] Address: [address]          [place], [place] [address] Johnnette Litter of [place] Phone: [phone] Relation: Spouse Secondary Emergency Contact: [place] Address: [address]          [place], [place] [address] Johnnette Litter of [place] Phone: [phone] Relation: Daughter  Code Status:  Full Code  Goals of care: Advanced Directive information Advanced Directives 05/28/2021  Does Patient Have a Medical Advance Directive? No  Would patient like information on creating a medical advance directive? No - Patient declined     Chief Complaint  Patient presents with   Establish Care    New Patient.     HPI:  Pt is a 55 y.o. female seen today to establish care for medical management of chronic diseases. Has a medical history of hypertension,chronic kidney disease stage 3 a, Lumbar Spinal stenosis with neuropathy,Migraine,GERD, Hiatal Hernia,major depression,Tachycardia,Insomnia,Generalized Anxiety disorder,left eye blindness, Hyperlipidemia,hx of breast cancer among others.   Breast cancer since 2022 had lumpectomy in June,2022.follows up with [facility]. Cough and shortness of breath uses albuterol  Takes lomitol and nausea medication due to breast cancer medication.   Migraine - Elavil effective. Follows up with Neurologist in [place].  Generalized Anxiety Disorder - xanax at bedtime effective.has bad panic attack wakes up and cannot breath.   GERD - on omeprazole Has hiatal hernia.has seen GI in the past due.Also has diverticulosis.   Neuropathy /left sciatica  - not as bad as it used to be. Follows up with Dr.Nika.previous back surgery did not work   Left  shoulder Tendonitis - resolved since she is not working any more   Ocular proptosis - blind in left eye.Has a prosthetic eye but does not close so going for surgery in march,2023   Hypertension - does not check blood pressure at home.sometimes when she stands up feels like b/p drops.usually get dizzy.has fallen in the past.almost fell today but sat on the bed.   Insomnia - elavil and xanax effective.has tried melatonin but did not work.  States had shingles vaccine at [facility] on [employer]. Has had COVID -19 booster and Flu shot at [place] too.  Has not had Tdap for several years.   Pap smear - had it done few months ago 2022. Follows up with Dr.Hentey.   Does little exercise.   Past Medical History:  Diagnosis Date   Acute pansinusitis 08/02/2017   Anxiety    Arthritis    ASD (atrial septal defect)    s/p closure with Amplatzer device 10/05/04 (Dr. Myriam Jacobson, [place]) 10/05/04   Cancer ([place])    Cataract    Dyspnea    GERD (gastroesophageal reflux disease)    Heart murmur    no longer heard   History of hiatal hernia    Legally blind in right eye, as defined in [place]    Lumbar herniated disc    PONV (postoperative nausea and vomiting)    Sciatica    Past Surgical History:  Procedure Laterality Date   ABLATION     BREAST LUMPECTOMY WITH RADIOACTIVE SEED AND SENTINEL LYMPH NODE BIOPSY Right 11/23/2020   Procedure: RIGHT BREAST LUMPECTOMY WITH RADIOACTIVE SEED AND RIGHT AXILLARY SENTINEL LYMPH NODE BIOPSY;  Surgeon: Rolm Bookbinder, MD;  Location: [place];  Service: General;  Laterality: Right;   BREAST SURGERY Bilateral 2011   Breast Reduction Surgery   BUNIONECTOMY     CARDIAC CATHETERIZATION     10/05/04 [facility]): LM < 25%, otherwise normal coronaries. No pulmonary HTN, Mildly enlarged RV. Secundum ASD s/p closure.   CARDIAC SURGERY     CATARACT EXTRACTION     CLEFT PALATE REPAIR     s/p cleft lip and palate repair   EYE SURGERY Right 2019    right eye removed   LUMBAR LAMINECTOMY/DECOMPRESSION MICRODISCECTOMY Left 05/23/2016   Procedure: LEFT L4-L5 LATERAL RECESS DECOMPRESSION WITH CENTRAL AND RIGHT DECOMPRESSION VIA LEFT SIDE;  Surgeon: [name], MD;  Location: [place];  Service: Orthopedics;  Laterality: Left;   LUMBAR LAMINECTOMY/DECOMPRESSION MICRODISCECTOMY Left 05/23/2016   Procedure: LUMBAR LAMINECTOMY/DECOMPRESSION MICRODISCECTOMY Lumbar five - Sacral One 1 LEVEL;  Surgeon: [name], MD;  Location: [place];  Service: Orthopedics;  Laterality: Left;   REDUCTION MAMMAPLASTY  2011   SHOULDER INJECTION Left 05/23/2016   Procedure: SHOULDER INJECTION;  Surgeon: [name], MD;  Location: [place];  Service: Orthopedics;  Laterality: Left;  band-aid per pa-c   TRANSTHORACIC ECHOCARDIOGRAM     [facility]): Mild LVH, EF > 16%, grade 1 diastolic dysfunction, Trivial MR/PR/TR.   TUBAL LIGATION      Allergies  Allergen Reactions   Zithromax [Azithromycin] Shortness Of Breath and Itching    TOTAL BODY ITCHING [EVEN SOLES OF FEET] WHEEZING    Psyllium Nausea And Vomiting    Allergies as of 05/28/2021       Reactions   Zithromax [azithromycin] Shortness Of Breath, Itching   TOTAL BODY ITCHING [EVEN SOLES OF FEET] WHEEZING   Psyllium Nausea And Vomiting        Medication List        Accurate as of May 28, 2021  9:43 AM. If you have any questions, ask your nurse or doctor.          STOP taking these medications    clindamycin 1 % gel Commonly known as: Clindagel Stopped by: Sandrea Hughs, NP   fluticasone-salmeterol 250-50 MCG/ACT Aepb Commonly known as: Advair Diskus Stopped by: Sandrea Hughs, NP   HYDROcodone-acetaminophen 5-325 MG tablet Commonly known as: NORCO/VICODIN Stopped by: Sandrea Hughs, NP   Magnesium 500 MG Tabs Stopped by: Nelda Bucks Sherl Yzaguirre, NP   Saccharomyces boulardii 400 MG Caps Stopped by: Nelda Bucks Koby Pickup, NP   Travoprost (BAK Free) 0.004 % Soln ophthalmic  solution Commonly known as: TRAVATAN Stopped by: Sandrea Hughs, NP       TAKE these medications    acetaminophen 500 MG tablet Commonly known as: TYLENOL Take 500-1,000 mg by mouth every 6 (six) hours as needed for moderate pain or headache.   albuterol (2.5 MG/3ML) 0.083% nebulizer solution Commonly known as: PROVENTIL Take 3 mLs (2.5 mg total) by nebulization every 6 (six) hours as needed for wheezing or shortness of breath. What changed: Another medication with the same name was removed. Continue taking this medication, and follow the directions you see here. Changed by: Sandrea Hughs, NP   ALPRAZolam 1 MG tablet Commonly known as: XANAX Take 1 tablet (1 mg total) by mouth at bedtime.   amitriptyline 75 MG tablet Commonly known as: ELAVIL Take 1 tablet (75 mg total) by mouth at bedtime.   B-12 PO Take 1 tablet by mouth daily.   diphenoxylate-atropine 2.5-0.025 MG  tablet Commonly known as: LOMOTIL Take 1 tablet by mouth 3 (three) times daily as needed for diarrhea or loose stools.   loperamide 2 MG capsule Commonly known as: IMODIUM Take 1-2 capsules (2-4 mg total) by mouth 4 (four) times daily as needed for diarrhea or loose stools.   Lubricant Eye Nighttime Oint Place 1 drop into the left eye at bedtime.   LUBRICATING EYE DROPS OP Place 1 drop into the left eye 4 (four) times daily.   multivitamin with minerals Tabs tablet Take 1 tablet by mouth daily.   omeprazole 20 MG tablet Commonly known as: PRILOSEC OTC Take 20 mg by mouth daily.   ondansetron 8 MG tablet Commonly known as: ZOFRAN TAKE 1 TABLET(8 MG) BY MOUTH EVERY 8 HOURS AS NEEDED FOR NAUSEA OR VOMITING   Verzenio 100 MG tablet Generic drug: abemaciclib TAKE 1 TABLET BY MOUTH TWICE DAILY. SWALLOW TABLETS WHOLE. DO NOT CHEW, CRUSH, OR SPLIT TABLETS BEFORE SWALLOWING.   Vitamin D (Ergocalciferol) 1.25 MG (50000 UNIT) Caps capsule Commonly known as: DRISDOL TAKE 1 CAPSULE ONCE PER WEEK         Review of Systems  Constitutional:  Positive for appetite change. Negative for chills, fatigue, fever and unexpected weight change.       Has no appetite but husband makes her eat at least one meal.sometimes forgets to eat  On plant based diet   HENT:  Positive for dental problem. Negative for congestion, ear discharge, ear pain, facial swelling, hearing loss, nosebleeds, postnasal drip, rhinorrhea, sinus pressure, sinus pain, sneezing, sore throat, tinnitus and trouble swallowing.        Broken teeth  Cleft lip repair   Eyes:  Positive for visual disturbance. Negative for pain, discharge, redness and itching.       Left eye blindness   Respiratory:  Negative for chest tightness, shortness of breath and wheezing.        Chronic dyspnea follows up with Pulmonologist    Cardiovascular:  Negative for chest pain, palpitations and leg swelling.  Gastrointestinal:  Negative for abdominal distention, abdominal pain, blood in stool, constipation and vomiting.       LBM 05/27/2021  Diverticulosis  Hx hiatal hernia  Diarrhea and nausea sometimes   Endocrine: Negative for cold intolerance, heat intolerance, polydipsia, polyphagia and polyuria.  Genitourinary:  Negative for difficulty urinating, dysuria, flank pain, frequency and urgency.  Musculoskeletal:  Positive for gait problem. Negative for arthralgias, back pain, joint swelling, myalgias, neck pain and neck stiffness.       Walks with a cane whenever she needs it.   Skin:  Negative for color change, pallor, rash and wound.  Neurological:  Negative for dizziness, syncope, speech difficulty, weakness, light-headedness and headaches.       Neuropathy   Hematological:  Does not bruise/bleed easily.  Psychiatric/Behavioral:  Positive for sleep disturbance. Negative for agitation, behavioral problems, confusion, hallucinations, self-injury and suicidal ideas. The patient is nervous/anxious.    Immunization History  Administered Date(s)  Administered   Influenza Inj Mdck Quad Pf 04/25/2019   Influenza,inj,Quad PF,6+ Mos 02/24/2020   Influenza,inj,quad, With Preservative 04/25/2019   Moderna Sars-Covid-2 Vaccination 06/29/2019, 08/03/2019   Pneumococcal Conjugate-13 05/29/2019   Pneumococcal Polysaccharide-23 07/29/2019   Pertinent  Health Maintenance Due  Topic Date Due   PAP SMEAR-Modifier  Never done   INFLUENZA VACCINE  01/04/2021   MAMMOGRAM  09/03/2022   COLONOSCOPY (Pts 45-63yrs Insurance coverage will need to be confirmed)  08/16/2027   Fall Risk 09/04/2020  11/23/2020 03/08/2021 05/03/2021 05/28/2021  Falls in the past year? - - - - 0  Number of falls in past year - - - - -  Was there an injury with Fall? - - - - 0  Was there an injury with Fall? - - - - -  Fall Risk Category Calculator - - - - 0  Fall Risk Category - - - - Low  Patient Fall Risk Level High fall risk Moderate fall risk Low fall risk High fall risk Low fall risk  Patient at Risk for Falls Due to - - - - No Fall Risks  Fall risk Follow up - - - - Falls evaluation completed   Functional Status Survey:    Vitals:   05/28/21 0926  BP: 118/62  Pulse: (!) 108  Resp: 18  Temp: (!) 97.3 F (36.3 C)  SpO2: 97%  Weight: 211 lb 6.4 oz (95.9 kg)  Height: 5\' 7"  (1.702 m)   Body mass index is 33.11 kg/m. Physical Exam Vitals reviewed.  Constitutional:      General: She is not in acute distress.    Appearance: Normal appearance. She is obese. She is not ill-appearing or diaphoretic.  HENT:     Head: Normocephalic.     Right Ear: There is impacted cerumen.     Left Ear: Tympanic membrane, ear canal and external ear normal. There is no impacted cerumen.     Nose: Nose normal. No congestion or rhinorrhea.     Mouth/Throat:     Mouth: Mucous membranes are moist.     Pharynx: Oropharynx is clear. No oropharyngeal exudate or posterior oropharyngeal erythema.  Eyes:     General: No scleral icterus.       Right eye: No discharge.     Extraocular  Movements: Extraocular movements intact.     Conjunctiva/sclera: Conjunctivae normal.     Comments: Left eye blind  Neck:     Vascular: No carotid bruit.  Cardiovascular:     Rate and Rhythm: Normal rate and regular rhythm.     Pulses: Normal pulses.     Heart sounds: Normal heart sounds. No murmur heard.   No friction rub. No gallop.  Pulmonary:     Effort: Pulmonary effort is normal. No respiratory distress.     Breath sounds: Normal breath sounds. No wheezing, rhonchi or rales.  Chest:     Chest wall: No tenderness.  Abdominal:     General: Bowel sounds are normal. There is no distension.     Palpations: Abdomen is soft. There is no mass.     Tenderness: There is no abdominal tenderness. There is no right CVA tenderness, left CVA tenderness, guarding or rebound.  Musculoskeletal:        General: No swelling or tenderness. Normal range of motion.     Cervical back: Normal range of motion. No rigidity or tenderness.     Right lower leg: No edema.     Left lower leg: No edema.  Lymphadenopathy:     Cervical: No cervical adenopathy.  Skin:    General: Skin is warm and dry.     Coloration: Skin is not pale.     Findings: No bruising, erythema, lesion or rash.  Neurological:     Mental Status: She is alert and oriented to person, place, and time.     Cranial Nerves: No cranial nerve deficit.     Sensory: No sensory deficit.     Motor: No weakness.  Coordination: Coordination normal.     Gait: Gait normal.  Psychiatric:        Mood and Affect: Mood normal.        Speech: Speech normal.        Behavior: Behavior normal.        Thought Content: Thought content normal.        Judgment: Judgment normal.    Labs reviewed: Recent Labs    01/22/21 1137 03/08/21 1111 05/03/21 1108  NA 143 144 143  K 3.9 4.1 3.9  CL 102 104 106  CO2 27 29 26   GLUCOSE 113* 101* 125*  BUN 11 10 13   CREATININE 1.43* 1.26* 1.25*  CALCIUM 9.2 9.3 9.2   Recent Labs    01/22/21 1137  03/08/21 1111 05/03/21 1108  AST 63* 55* 151*  ALT 64* 47* 112*  ALKPHOS 156* 138* 178*  BILITOT 0.6 0.4 0.7  PROT 7.7 7.7 7.7  ALBUMIN 3.6 3.4* 3.6   Recent Labs    01/22/21 1137 03/08/21 1111 05/03/21 1108  WBC 4.1 4.7 3.3*  NEUTROABS 1.6* 1.8 0.9*  HGB 10.7* 11.1* 11.3*  HCT 32.6* 33.9* 33.1*  MCV 103.5* 103.0* 101.8*  PLT 218 304 223   Lab Results  Component Value Date   TSH 2.083 09/06/2019   No results found for: HGBA1C Lab Results  Component Value Date   TRIG 301 (H) 08/04/2019    Significant Diagnostic Results in last 30 days:  No results found.  Assessment/Plan  1. Encounter to establish care Available medical records reviewed.Immunization up to date except due for Tdap vaccine.will await final medical records then will recommend to get Tdap at the pharmacy if indicated.   2. Impacted cerumen of right ear TM not visualized due to cerumen impaction  - Advised to instill debrox 6.5 otic solution 5 drops into each ear twice daily x 4 days then follow up for ear lavage.May apply cotton ball at bedtime to prevent drainage to pillow.  - carbamide peroxide (DEBROX) 6.5 % OTIC solution; Place 5 drops into the right ear 2 (two) times daily.  Dispense: 15 mL; Refill: 0  3. Essential hypertension Orthostatics  HR 104 b/min on Metoprolol tartrate  Will refer to Cardiologist for  further evaluation - Lipid Panel - TSH  4. Spinal stenosis, lumbar region with neurogenic claudication Continue current pain regimen  - continue to follow up with [facility]   5. Gastroesophageal reflux disease without esophagitis Stable  Continue on Omeprazole   6. Diarrhea, unspecified type Continue on Lomotil  and Imodium   7. Tachycardia - EKG 12-Lead indicates Sinus Tachy HR 104 b/min  - continue on Metoprolol tartrate  - Ambulatory referral to Cardiology - TSH  8. Orthostatic hypotension Significant B/p drop with standing  Fall and safety  precaution advised  - Ambulatory referral to Cardiology  9. Fatty liver Chronic  Continue to follow up with GI   10. Vitamin D deficiency Continue on vitamin D supplement  - Vitamin D, 1,25-dihydroxy  11. Mixed hyperlipidemia No latest Lipid panel for review. Recommended Heart healthy diet - continue on rosuvastatin   Family/ staff Communication: Reviewed plan of care with patient verbalized understanding   Labs/tests ordered:  - EKG 12-Lead - Vitamin D, 1,25-dihydroxy - TSH Next Appointment : 6 months for medical management of chronic issues.one week for right ear lavage.     Sandrea Hughs, NP

## 2021-05-28 NOTE — Patient Instructions (Signed)
-   Instill debrox 6.5 otic solution 5 drops into right ear twice daily x 4 days then follow up for ear lavage.May apply cotton ball at bedtime to prevent drainage to pillow.  

## 2021-06-01 ENCOUNTER — Other Ambulatory Visit: Payer: Self-pay

## 2021-06-01 ENCOUNTER — Inpatient Hospital Stay: Payer: Medicare Other

## 2021-06-01 ENCOUNTER — Inpatient Hospital Stay: Payer: Medicare Other | Attending: Hematology

## 2021-06-01 VITALS — BP 159/72 | HR 102 | Temp 98.5°F | Resp 18

## 2021-06-01 DIAGNOSIS — Z170 Estrogen receptor positive status [ER+]: Secondary | ICD-10-CM | POA: Insufficient documentation

## 2021-06-01 DIAGNOSIS — C50919 Malignant neoplasm of unspecified site of unspecified female breast: Secondary | ICD-10-CM

## 2021-06-01 DIAGNOSIS — C50511 Malignant neoplasm of lower-outer quadrant of right female breast: Secondary | ICD-10-CM | POA: Diagnosis present

## 2021-06-01 DIAGNOSIS — Z79899 Other long term (current) drug therapy: Secondary | ICD-10-CM | POA: Diagnosis not present

## 2021-06-01 DIAGNOSIS — Z5111 Encounter for antineoplastic chemotherapy: Secondary | ICD-10-CM | POA: Insufficient documentation

## 2021-06-01 LAB — CMP (CANCER CENTER ONLY)
ALT: 67 U/L — ABNORMAL HIGH (ref 0–44)
AST: 52 U/L — ABNORMAL HIGH (ref 15–41)
Albumin: 3.7 g/dL (ref 3.5–5.0)
Alkaline Phosphatase: 158 U/L — ABNORMAL HIGH (ref 38–126)
Anion gap: 8 (ref 5–15)
BUN: 11 mg/dL (ref 6–20)
CO2: 26 mmol/L (ref 22–32)
Calcium: 8.9 mg/dL (ref 8.9–10.3)
Chloride: 104 mmol/L (ref 98–111)
Creatinine: 1.17 mg/dL — ABNORMAL HIGH (ref 0.44–1.00)
GFR, Estimated: 55 mL/min — ABNORMAL LOW (ref >60)
Glucose, Bld: 116 mg/dL — ABNORMAL HIGH (ref 70–99)
Potassium: 3.9 mmol/L (ref 3.5–5.1)
Sodium: 138 mmol/L (ref 135–145)
Total Bilirubin: 0.5 mg/dL (ref 0.3–1.2)
Total Protein: 7.2 g/dL (ref 6.5–8.1)

## 2021-06-01 LAB — VITAMIN D 1,25 DIHYDROXY
Vitamin D 1, 25 (OH)2 Total: 62 pg/mL (ref 18–72)
Vitamin D2 1, 25 (OH)2: 62 pg/mL
Vitamin D3 1, 25 (OH)2: <8 pg/mL

## 2021-06-01 LAB — CBC WITH DIFFERENTIAL/PLATELET
Abs Immature Granulocytes: 0.01 10*3/uL (ref 0.00–0.07)
Basophils Absolute: 0 10*3/uL (ref 0.0–0.1)
Basophils Relative: 1 %
Eosinophils Absolute: 0 10*3/uL (ref 0.0–0.5)
Eosinophils Relative: 1 %
HCT: 34.2 % — ABNORMAL LOW (ref 36.0–46.0)
Hemoglobin: 11.4 g/dL — ABNORMAL LOW (ref 12.0–15.0)
Immature Granulocytes: 0 %
Lymphocytes Relative: 63 %
Lymphs Abs: 2.5 10*3/uL (ref 0.7–4.0)
MCH: 34 pg (ref 26.0–34.0)
MCHC: 33.3 g/dL (ref 30.0–36.0)
MCV: 102.1 fL — ABNORMAL HIGH (ref 80.0–100.0)
Monocytes Absolute: 0.2 10*3/uL (ref 0.1–1.0)
Monocytes Relative: 5 %
Neutro Abs: 1.2 10*3/uL — ABNORMAL LOW (ref 1.7–7.7)
Neutrophils Relative %: 30 %
Platelets: 240 10*3/uL (ref 150–400)
RBC: 3.35 MIL/uL — ABNORMAL LOW (ref 3.87–5.11)
RDW: 13.8 % (ref 11.5–15.5)
WBC: 3.9 10*3/uL — ABNORMAL LOW (ref 4.0–10.5)
nRBC: 0 % (ref 0.0–0.2)

## 2021-06-01 LAB — LIPID PANEL
Cholesterol: 291 mg/dL — ABNORMAL HIGH (ref <200)
HDL: 87 mg/dL (ref >=50)
LDL Cholesterol (Calc): 172 mg/dL (calc) — ABNORMAL HIGH
Non-HDL Cholesterol (Calc): 204 mg/dL (calc) — ABNORMAL HIGH (ref <130)
Total CHOL/HDL Ratio: 3.3 (calc) (ref <5.0)
Triglycerides: 167 mg/dL — ABNORMAL HIGH (ref <150)

## 2021-06-01 LAB — TSH: TSH: 0.91 mIU/L (ref 0.40–4.50)

## 2021-06-01 MED ORDER — FULVESTRANT 250 MG/5ML IM SOSY
500.0000 mg | PREFILLED_SYRINGE | Freq: Once | INTRAMUSCULAR | Status: AC
  Administered 2021-06-01: 12:00:00 500 mg via INTRAMUSCULAR
  Filled 2021-06-01: qty 10

## 2021-06-02 ENCOUNTER — Encounter (HOSPITAL_BASED_OUTPATIENT_CLINIC_OR_DEPARTMENT_OTHER): Payer: Self-pay | Admitting: Family

## 2021-06-03 ENCOUNTER — Other Ambulatory Visit: Payer: Self-pay

## 2021-06-03 ENCOUNTER — Ambulatory Visit: Payer: Medicare Other | Admitting: Cardiology

## 2021-06-03 ENCOUNTER — Encounter: Payer: Self-pay | Admitting: Cardiology

## 2021-06-03 ENCOUNTER — Ambulatory Visit (HOSPITAL_COMMUNITY): Payer: Medicare Other | Attending: Cardiovascular Disease

## 2021-06-03 VITALS — BP 130/78 | HR 88 | Ht 67.0 in | Wt 216.0 lb

## 2021-06-03 DIAGNOSIS — R0602 Shortness of breath: Secondary | ICD-10-CM | POA: Diagnosis present

## 2021-06-03 DIAGNOSIS — R079 Chest pain, unspecified: Secondary | ICD-10-CM

## 2021-06-03 DIAGNOSIS — Z79899 Other long term (current) drug therapy: Secondary | ICD-10-CM

## 2021-06-03 DIAGNOSIS — R400 Somnolence: Secondary | ICD-10-CM

## 2021-06-03 DIAGNOSIS — R5383 Other fatigue: Secondary | ICD-10-CM

## 2021-06-03 DIAGNOSIS — Q2111 Secundum atrial septal defect: Secondary | ICD-10-CM

## 2021-06-03 DIAGNOSIS — R0609 Other forms of dyspnea: Secondary | ICD-10-CM | POA: Diagnosis not present

## 2021-06-03 DIAGNOSIS — Z8774 Personal history of (corrected) congenital malformations of heart and circulatory system: Secondary | ICD-10-CM

## 2021-06-03 DIAGNOSIS — R0683 Snoring: Secondary | ICD-10-CM

## 2021-06-03 LAB — ECHOCARDIOGRAM COMPLETE
Area-P 1/2: 3.7 cm2
S' Lateral: 2.3 cm

## 2021-06-03 MED ORDER — METOPROLOL TARTRATE 100 MG PO TABS: ORAL_TABLET | ORAL | 0 refills | Status: DC

## 2021-06-03 MED ORDER — ROSUVASTATIN CALCIUM 10 MG PO TABS: 10.0000 mg | ORAL_TABLET | Freq: Every day | ORAL | 3 refills | Status: DC

## 2021-06-03 MED ORDER — PERFLUTREN LIPID MICROSPHERE
1.0000 mL | INTRAVENOUS | Status: AC | PRN
Start: 2021-06-03 — End: 2021-06-03
  Administered 2021-06-03: 2 mL via INTRAVENOUS

## 2021-06-03 NOTE — Patient Instructions (Addendum)
Medication Instructions:  Your physician has recommended you make the following change in your medication:  START: Crestor 10 mg once daily *If you need a refill on your cardiac medications before your next appointment, please call your pharmacy*   Lab Work: Your physician recommends that you return for lab work in:  3-7 days before CT scan: BMET, Mag In 8 weeks: Lipids If you have labs (blood work) drawn today and your tests are completely normal, you will receive your results only by: [place] (if you have MyChart) OR A paper copy in the mail If you have any lab test that is abnormal or we need to change your treatment, we will call you to review the results.   Testing/Procedures: Your physician has recommended that you have a sleep study. This test records several body functions during sleep, including: brain activity, eye movement, oxygen and carbon dioxide blood levels, heart rate and rhythm, breathing rate and rhythm, the flow of air through your mouth and nose, snoring, body muscle movements, and chest and belly movement.    Your cardiac CT will be scheduled at one of the below locations:   [facility] [address] [place], [place] [address] [phone]  If scheduled at [facility], please arrive at the [facility] (entrance A) of [facility] 30 minutes prior to test start time. You can use the FREE valet parking offered at the main entrance (encouraged to control the heart rate for the test) Proceed to the [facility] Radiology Department (first floor) to check-in and test prep.   Please follow these instructions carefully (unless otherwise directed):   On the Night Before the Test: Be sure to Drink plenty of water. Do not consume any caffeinated/decaffeinated beverages or chocolate 12 hours prior to your test. Do not take any antihistamines 12 hours prior to your test.  On the Day of the Test: Drink plenty of  water until 1 hour prior to the test. Do not eat any food 4 hours prior to the test. You may take your regular medications prior to the test.  Take metoprolol (Lopressor) two hours prior to test. FEMALES- please wear underwire-free bra if available, avoid dresses & tight clothing      After the Test: Drink plenty of water. After receiving IV contrast, you may experience a mild flushed feeling. This is normal. On occasion, you may experience a mild rash up to 24 hours after the test. This is not dangerous. If this occurs, you can take Benadryl 25 mg and increase your fluid intake. If you experience trouble breathing, this can be serious. If it is severe call 911 IMMEDIATELY. If it is mild, please call our office. If you take any of these medications: Glipizide/Metformin, Avandament, Glucavance, please do not take 48 hours after completing test unless otherwise instructed.  Please allow 2-4 weeks for scheduling of routine cardiac CTs. Some insurance companies require a pre-authorization which may delay scheduling of this test.   For non-scheduling related questions, please contact the cardiac imaging nurse navigator should you have any questions/concerns: Marchia Bond, Cardiac Imaging Nurse Navigator Gordy Clement, Cardiac Imaging Nurse Navigator [facility] Direct Office Dial: [phone]   For scheduling needs, including cancellations and rescheduling, please call [place], [phone].    Follow-Up: At [facility], you and your health needs are our priority.  As part of our continuing mission to provide you with exceptional heart care, we have created designated Provider  Care Teams.  These Care Teams include your primary Cardiologist (physician) and Advanced Practice Providers (APPs -  Physician Assistants and Nurse Practitioners) who all work together to provide you with the care you need, when you need it.  We recommend signing up for the patient portal  called "MyChart".  Sign up information is provided on this After Visit Summary.  MyChart is used to connect with patients for Virtual Visits (Telemedicine).  Patients are able to view lab/test results, encounter notes, upcoming appointments, etc.  Non-urgent messages can be sent to your provider as well.   To learn more about what you can do with MyChart, go to [email or web address].    Your next appointment:   6 month(s)  The format for your next appointment:   In Person  Provider:   Berniece Salines, DO     Other Instructions

## 2021-06-03 NOTE — Progress Notes (Signed)
Cardiology Office Note:    Date:  06/04/2021   ID:  Brandy [name], DOB 1965-10-25, MRN [id number]  PCP:  Sandrea Hughs, NP  Cardiologist:  Berniece Salines, DO  Electrophysiologist:  None   Referring MD: Sandrea Hughs, NP   " I am having shortness of breath"  History of Present Illness:    Brandy [name] is a 55 y.o. female with a hx of secundum ASD status postrepair in 2006 per pt, heart catheterization done in 2013 at that time did not show any evidence of coronary artery disease, anxiety, lung cancer status post surgery and echocardiogram which was done in 2007 showed normal ejection fraction to establish cardiac care.  She tells me that over the last few months she has been experiencing shortness of breath. She also notes intermittent chest discomfort. She describes this as a midsternal sensation. With no radiation nothing make it better or worse. She also has been experiencing fatigue and daytime somnolence.    She is concerned.   Past Medical History:  Diagnosis Date   Acute pansinusitis 08/02/2017   Anxiety    Arthritis    ASD (atrial septal defect)    s/p closure with Amplatzer device 10/05/04 (Dr. Myriam Jacobson, [place]) 10/05/04   Cancer ([place])    Cataract    Dyspnea    GERD (gastroesophageal reflux disease)    Heart murmur    no longer heard   History of hiatal hernia    Legally blind in right eye, as defined in [place]    Lumbar herniated disc    PONV (postoperative nausea and vomiting)    Sciatica     Past Surgical History:  Procedure Laterality Date   ABLATION     BREAST LUMPECTOMY WITH RADIOACTIVE SEED AND SENTINEL LYMPH NODE BIOPSY Right 11/23/2020   Procedure: RIGHT BREAST LUMPECTOMY WITH RADIOACTIVE SEED AND RIGHT AXILLARY SENTINEL LYMPH NODE BIOPSY;  Surgeon: Rolm Bookbinder, MD;  Location: [place];  Service: General;  Laterality: Right;   BREAST SURGERY Bilateral 2011   Breast Reduction Surgery   BUNIONECTOMY     CARDIAC CATHETERIZATION      10/05/04 [facility]): LM < 25%, otherwise normal coronaries. No pulmonary HTN, Mildly enlarged RV. Secundum ASD s/p closure.   CARDIAC SURGERY     CATARACT EXTRACTION     CLEFT PALATE REPAIR     s/p cleft lip and palate repair   EYE SURGERY Right 2019   right eye removed   LUMBAR LAMINECTOMY/DECOMPRESSION MICRODISCECTOMY Left 05/23/2016   Procedure: LEFT L4-L5 LATERAL RECESS DECOMPRESSION WITH CENTRAL AND RIGHT DECOMPRESSION VIA LEFT SIDE;  Surgeon: [name], MD;  Location: [place];  Service: Orthopedics;  Laterality: Left;   LUMBAR LAMINECTOMY/DECOMPRESSION MICRODISCECTOMY Left 05/23/2016   Procedure: LUMBAR LAMINECTOMY/DECOMPRESSION MICRODISCECTOMY Lumbar five - Sacral One 1 LEVEL;  Surgeon: [name], MD;  Location: [place];  Service: Orthopedics;  Laterality: Left;   REDUCTION MAMMAPLASTY  2011   SHOULDER INJECTION Left 05/23/2016   Procedure: SHOULDER INJECTION;  Surgeon: [name], MD;  Location: [place];  Service: Orthopedics;  Laterality: Left;  band-aid per pa-c   TRANSTHORACIC ECHOCARDIOGRAM     12/15/05 [facility]): Mild LVH, EF > 92%, grade 1 diastolic dysfunction, Trivial MR/PR/TR.   TUBAL LIGATION      Current Medications: Current Meds  Medication Sig   acetaminophen (TYLENOL) 500 MG tablet Take 500-1,000 mg by mouth every 6 (six) hours as needed for moderate pain or headache.   albuterol (PROVENTIL) (  2.5 MG/3ML) 0.083% nebulizer solution Take 3 mLs (2.5 mg total) by nebulization every 6 (six) hours as needed for wheezing or shortness of breath.   ALPRAZolam (XANAX) 1 MG tablet Take 1 tablet (1 mg total) by mouth at bedtime.   amitriptyline (ELAVIL) 75 MG tablet Take 1 tablet (75 mg total) by mouth at bedtime.   carbamide peroxide (DEBROX) 6.5 % OTIC solution Place 5 drops into the right ear 2 (two) times daily.   Carboxymethylcellul-Glycerin (LUBRICATING EYE DROPS OP) Place 1 drop into the left eye 4 (four) times daily.   Cyanocobalamin (B-12 PO) Take 1 tablet by mouth daily.    diphenoxylate-atropine (LOMOTIL) 2.5-0.025 MG tablet Take 1 tablet by mouth 3 (three) times daily as needed for diarrhea or loose stools.   loperamide (IMODIUM) 2 MG capsule Take 1-2 capsules (2-4 mg total) by mouth 4 (four) times daily as needed for diarrhea or loose stools.   metoprolol tartrate (LOPRESSOR) 100 MG tablet Take 2 hours prior to CT   Multiple Vitamin (MULTIVITAMIN WITH MINERALS) TABS tablet Take 1 tablet by mouth daily.   omeprazole (PRILOSEC OTC) 20 MG tablet Take 20 mg by mouth daily.   ondansetron (ZOFRAN) 8 MG tablet TAKE 1 TABLET(8 MG) BY MOUTH EVERY 8 HOURS AS NEEDED FOR NAUSEA OR VOMITING   rosuvastatin (CRESTOR) 10 MG tablet Take 1 tablet (10 mg total) by mouth daily.   VERZENIO 100 MG tablet TAKE 1 TABLET BY MOUTH TWICE DAILY. SWALLOW TABLETS WHOLE. DO NOT CHEW, CRUSH, OR SPLIT TABLETS BEFORE SWALLOWING.   Vitamin D, Ergocalciferol, (DRISDOL) 1.25 MG (50000 UNIT) CAPS capsule TAKE 1 CAPSULE ONCE PER WEEK   White Petrolatum-Mineral Oil (LUBRICANT EYE NIGHTTIME) OINT Place 1 drop into the left eye at bedtime.     Allergies:   Zithromax [azithromycin], Other, and Psyllium   Social History   Socioeconomic History   Marital status: Married    Spouse name: Not on file   Number of children: 3   Years of education: Not on file   Highest education level: Not on file  Occupational History   Not on file  Tobacco Use   Smoking status: Former    Packs/day: 0.25    Years: 25.00    Pack years: 6.25    Types: Cigarettes    Quit date: 02/04/2018    Years since quitting: 3.3   Smokeless tobacco: Never  Vaping Use   Vaping Use: Never used  Substance and Sexual Activity   Alcohol use: No   Drug use: No   Sexual activity: Not on file  Other Topics Concern   Not on file  Social History Narrative   Tobacco use, amount per day now: None.   Past tobacco use, amount per day: 1/4   How many years did you use tobacco: Intermittent x 20 years   Alcohol use (drinks per week):  N/A   Diet: Plant Base   Do you drink/eat things with caffeine: Coffee, Tea, Soda.   Marital status:    Married                              What year were you married? 1999   Do you live in a house, apartment, assisted living, condo, trailer, etc.? House    Is it one or more stories? 2   How many persons live in your home? 6 adults, 5 children.   Do you have pets in your home?(  please list) 1 Terrier   Highest Level of education completed? AAS   Current or past profession: LPN   Do you exercise?   A little                               Type and how often? Walk, stretches.    Do you have a living will? No   Do you have a DNR form?   No                                If not, do you want to discuss one?   Do you have signed POA/HPOA forms? No                       If so, please bring to you appointment      Do you have any difficulty bathing or dressing yourself? No   Do you have any difficulty preparing food or eating? No   Do you have any difficulty managing your medications? No   Do you have any difficulty managing your finances? No   Do you have any difficulty affording your medications? No.   Social Determinants of Health   Financial Resource Strain: Not on file  Food Insecurity: Not on file  Transportation Needs: Not on file  Physical Activity: Not on file  Stress: Not on file  Social Connections: Not on file     Family History: The patient's family history includes Arthritis in her mother; Cancer in her father; Diabetes in her mother; High blood pressure in her mother; Hypertension in her mother. There is no history of Colon cancer, Colon polyps, Esophageal cancer, Rectal cancer, or Stomach cancer.  ROS:   Review of Systems  Constitution: Negative for decreased appetite, fever and weight gain.  HENT: Negative for congestion, ear discharge, hoarse voice and sore throat.   Eyes: Negative for discharge, redness, vision loss in right eye and visual halos.  Cardiovascular:  Negative for chest pain, dyspnea on exertion, leg swelling, orthopnea and palpitations.  Respiratory: Negative for cough, hemoptysis, shortness of breath and snoring.   Endocrine: Negative for heat intolerance and polyphagia.  Hematologic/Lymphatic: Negative for bleeding problem. Does not bruise/bleed easily.  Skin: Negative for flushing, nail changes, rash and suspicious lesions.  Musculoskeletal: Negative for arthritis, joint pain, muscle cramps, myalgias, neck pain and stiffness.  Gastrointestinal: Negative for abdominal pain, bowel incontinence, diarrhea and excessive appetite.  Genitourinary: Negative for decreased libido, genital sores and incomplete emptying.  Neurological: Negative for brief paralysis, focal weakness, headaches and loss of balance.  Psychiatric/Behavioral: Negative for altered mental status, depression and suicidal ideas.  Allergic/Immunologic: Negative for HIV exposure and persistent infections.    EKGs/Labs/Other Studies Reviewed:    The following studies were reviewed today:   EKG:  The ekg ordered today demonstrates sinus rhythm   Cardiac catheterization: [place], [place]   FOUNTAIN-[name], EVEL  MRN: [id number]  DOB: Dec 07, 1965  IP  Referring Cardiologist: Inez Pilgrim, DO  Procedure Date: 10/05/2004  [place]ine #: [id number]   EVEL FOUNTAIN-[name] underwent the following procedures:   ICE   Closure of ASD   Right heart catheterization   Fick cardiac output   Oximetry run   Aortic pressure measurement   Coronary angiogram - left   Coronary angiogram - right   INDICATION(S) FOR Gareth Morgan Marland Kitchen  indicates primary reason for catheterization)   * 745.5 - Ostium secundum type ASD   786.05 - Shortness of breath   786.59 - Other chest pain (discomfort, pressure, tightness)   ENCOUNTER CATEGORY   Elective shunt closure   DIAGNOSTIC SUMMARY   Coronary Artery Disease     Left Main:  normal     LAD system:  normal     LCX  system:  normal     RCA system:  normal   Right Heart Catheterization     Pulmonary Hypertension:  Absent   Congenital Heart Disease     ASD: Secundum   Shunt ratio (Qp / Qs)     Baseline: 2.2   FINAL DIAGNOSIS   745.5  Ostium secundum type ASD   786.59  Other chest pain (discomfort, pressure, tightness)   786.05  Shortness of breath   COMMENT   The secundum ASD measured 84mm by balloon sizing, therefore a 68mm     Amplatzer device was placed.  The RULPV was patent by ICE color Doppler,     as was the SVC.    Recent Labs: 05/28/2021: TSH 0.91 06/01/2021: ALT 67; BUN 11; Creatinine 1.17; Hemoglobin 11.4; Platelets 240; Potassium 3.9; Sodium 138  Recent Lipid Panel    Component Value Date/Time   CHOL 291 (H) 05/28/2021 1115   TRIG 167 (H) 05/28/2021 1115   HDL 87 05/28/2021 1115   CHOLHDL 3.3 05/28/2021 1115   LDLCALC 172 (H) 05/28/2021 1115    Physical Exam:    VS:  BP 130/78 (BP Location: Right Arm)    Pulse 88    Ht 5\' 7"  (1.702 m)    Wt 216 lb (98 kg)    SpO2 96%    BMI 33.83 kg/m     Wt Readings from Last 3 Encounters:  06/03/21 216 lb (98 kg)  05/28/21 211 lb 6.4 oz (95.9 kg)  05/03/21 214 lb (97.1 kg)     GEN: Well nourished, well developed in no acute distress HEENT: Normal NECK: No JVD; No carotid bruits LYMPHATICS: No lymphadenopathy CARDIAC: S1S2 noted,RRR, no murmurs, rubs, gallops RESPIRATORY:  Clear to auscultation without rales, wheezing or rhonchi  ABDOMEN: Soft, non-tender, non-distended, +bowel sounds, no guarding. EXTREMITIES: No edema, No cyanosis, no clubbing MUSCULOSKELETAL:  No deformity  SKIN: Warm and dry NEUROLOGIC:  Alert and oriented x 3, non-focal PSYCHIATRIC:  Normal affect, good insight  ASSESSMENT:    1. Status post device closure of ASD   2. Secundum ASD   3. Chest pain, unspecified type   4. DOE (dyspnea on exertion)   5. Snoring   6. Fatigue, unspecified type   7. Chest pain of uncertain etiology   8. Daytime somnolence    9. Medication management    PLAN:     She had an echo today which I was personally able to review. Her Ef is normal and ASD closure device appears to be well seated, no evidence of any valvular heart disease.  With her persistent symptoms and risk factors will pursue an ischemic evaluation with a coronary CT scan. She has IV contrast allergy she is agreeable to proceed with this testing. For her fatigue as well as daytime somnolence sleep study will be ordered. I reviewed her recent lipid profile and discussed with the patient started her on Crestor 10 mg daily.  Repeat lipid profile in 10 weeks. The patient is in agreement with the above plan. The patient left the office in stable condition.  The patient will follow up in 6 months or sooner if needed.   Medication Adjustments/Labs and Tests Ordered: Current medicines are reviewed at length with the patient today.  Concerns regarding medicines are outlined above.  Orders Placed This Encounter  Procedures   CT CORONARY MORPH W/CTA COR W/SCORE W/CA W/CM &/OR WO/CM   Basic Metabolic Panel (BMET)   Magnesium   Lipid panel   EKG 12-Lead   Split night study   Meds ordered this encounter  Medications   rosuvastatin (CRESTOR) 10 MG tablet    Sig: Take 1 tablet (10 mg total) by mouth daily.    Dispense:  90 tablet    Refill:  3   metoprolol tartrate (LOPRESSOR) 100 MG tablet    Sig: Take 2 hours prior to CT    Dispense:  1 tablet    Refill:  0    Patient Instructions  Medication Instructions:  Your physician has recommended you make the following change in your medication:  START: Crestor 10 mg once daily *If you need a refill on your cardiac medications before your next appointment, please call your pharmacy*   Lab Work: Your physician recommends that you return for lab work in:  3-7 days before CT scan: BMET, Mag In 8 weeks: Lipids If you have labs (blood work) drawn today and your tests are completely normal, you will  receive your results only by: [place] (if you have MyChart) OR A paper copy in the mail If you have any lab test that is abnormal or we need to change your treatment, we will call you to review the results.   Testing/Procedures: Your physician has recommended that you have a sleep study. This test records several body functions during sleep, including: brain activity, eye movement, oxygen and carbon dioxide blood levels, heart rate and rhythm, breathing rate and rhythm, the flow of air through your mouth and nose, snoring, body muscle movements, and chest and belly movement.    Your cardiac CT will be scheduled at one of the below locations:   [facility] [address] [place], [place] [address] [phone]  If scheduled at [facility], please arrive at the [facility] main entrance (entrance A) of [facility] 30 minutes prior to test start time. You can use the FREE valet parking offered at the main entrance (encouraged to control the heart rate for the test) Proceed to the [facility] Radiology Department (first floor) to check-in and test prep.   Please follow these instructions carefully (unless otherwise directed):   On the Night Before the Test: Be sure to Drink plenty of water. Do not consume any caffeinated/decaffeinated beverages or chocolate 12 hours prior to your test. Do not take any antihistamines 12 hours prior to your test.  On the Day of the Test: Drink plenty of water until 1 hour prior to the test. Do not eat any food 4 hours prior to the test. You may take your regular medications prior to the test.  Take metoprolol (Lopressor) two hours prior to test. FEMALES- please wear underwire-free bra if available, avoid dresses & tight clothing      After the Test: Drink plenty of water. After receiving IV contrast, you may experience a mild flushed feeling. This is normal. On occasion, you may experience a mild rash up to 24  hours after the test. This is not dangerous. If this occurs, you can take Benadryl 25 mg and increase your fluid intake. If  you experience trouble breathing, this can be serious. If it is severe call 911 IMMEDIATELY. If it is mild, please call our office. If you take any of these medications: Glipizide/Metformin, Avandament, Glucavance, please do not take 48 hours after completing test unless otherwise instructed.  Please allow 2-4 weeks for scheduling of routine cardiac CTs. Some insurance companies require a pre-authorization which may delay scheduling of this test.   For non-scheduling related questions, please contact the cardiac imaging nurse navigator should you have any questions/concerns: Marchia Bond, Cardiac Imaging Nurse Navigator Gordy Clement, Cardiac Imaging Nurse Navigator [facility] Direct Office Dial: [phone]   For scheduling needs, including cancellations and rescheduling, please call [place], [phone].    Follow-Up: At [facility], you and your health needs are our priority.  As part of our continuing mission to provide you with exceptional heart care, we have created designated Provider Care Teams.  These Care Teams include your primary Cardiologist (physician) and Advanced Practice Providers (APPs -  Physician Assistants and Nurse Practitioners) who all work together to provide you with the care you need, when you need it.  We recommend signing up for the patient portal called "MyChart".  Sign up information is provided on this After Visit Summary.  MyChart is used to connect with patients for Virtual Visits (Telemedicine).  Patients are able to view lab/test results, encounter notes, upcoming appointments, etc.  Non-urgent messages can be sent to your provider as well.   To learn more about what you can do with MyChart, go to [email or web address].    Your next appointment:   6 month(s)  The format for your next appointment:    In Person  Provider:   Berniece Salines, DO     Other Instructions     Adopting a Healthy Lifestyle.  Know what a healthy weight is for you (roughly BMI <25) and aim to maintain this   Aim for 7+ servings of fruits and vegetables daily   65-80+ fluid ounces of water or unsweet tea for healthy kidneys   Limit to max 1 drink of alcohol per day; avoid smoking/tobacco   Limit animal fats in diet for cholesterol and heart health - choose grass fed whenever available   Avoid highly processed foods, and foods high in saturated/trans fats   Aim for low stress - take time to unwind and care for your mental health   Aim for 150 min of moderate intensity exercise weekly for heart health, and weights twice weekly for bone health   Aim for 7-9 hours of sleep daily   When it comes to diets, agreement about the perfect plan isnt easy to find, even among the experts. Experts at the [place] developed an idea known as the Healthy Eating Plate. Just imagine a plate divided into logical, healthy portions.   The emphasis is on diet quality:   Load up on vegetables and fruits - one-half of your plate: Aim for color and variety, and remember that potatoes dont count.   Go for whole grains - one-quarter of your plate: Whole wheat, barley, wheat berries, quinoa, oats, brown rice, and foods made with them. If you want pasta, go with whole wheat pasta.   Protein power - one-quarter of your plate: Fish, chicken, beans, and nuts are all healthy, versatile protein sources. Limit red meat.   The diet, however, does go beyond the plate, offering a few other suggestions.   Use healthy plant oils, such as  olive, canola, soy, corn, sunflower and peanut. Check the labels, and avoid partially hydrogenated oil, which have unhealthy trans fats.   If youre thirsty, drink water. Coffee and tea are good in moderation, but skip sugary drinks and limit milk and dairy products to one or two  daily servings.   The type of carbohydrate in the diet is more important than the amount. Some sources of carbohydrates, such as vegetables, fruits, whole grains, and beans-are healthier than others.   Finally, stay active  Signed, Berniece Salines, DO  06/04/2021 9:24 AM    [facility]

## 2021-06-04 ENCOUNTER — Ambulatory Visit: Payer: Medicare Other | Admitting: Family

## 2021-06-08 NOTE — Telephone Encounter (Signed)
Opened in error

## 2021-06-10 ENCOUNTER — Other Ambulatory Visit: Payer: Self-pay

## 2021-06-10 MED ORDER — ROSUVASTATIN CALCIUM 10 MG PO TABS: 10.0000 mg | ORAL_TABLET | Freq: Every day | ORAL | 3 refills | Status: DC

## 2021-06-11 ENCOUNTER — Ambulatory Visit: Payer: Medicare Other | Admitting: Pulmonary Disease

## 2021-06-15 ENCOUNTER — Other Ambulatory Visit: Payer: Self-pay | Admitting: Hematology

## 2021-06-15 ENCOUNTER — Telehealth: Payer: Self-pay | Admitting: *Deleted

## 2021-06-15 DIAGNOSIS — C50919 Malignant neoplasm of unspecified site of unspecified female breast: Secondary | ICD-10-CM

## 2021-06-15 LAB — BASIC METABOLIC PANEL
BUN/Creatinine Ratio: 9 (ref 9–23)
BUN: 13 mg/dL (ref 6–24)
CO2: 25 mmol/L (ref 20–29)
Calcium: 9.6 mg/dL (ref 8.7–10.2)
Chloride: 102 mmol/L (ref 96–106)
Creatinine, Ser: 1.48 mg/dL — ABNORMAL HIGH (ref 0.57–1.00)
Glucose: 88 mg/dL (ref 70–99)
Potassium: 4.7 mmol/L (ref 3.5–5.2)
Sodium: 143 mmol/L (ref 134–144)
eGFR: 41 mL/min/{1.73_m2} — ABNORMAL LOW (ref >59)

## 2021-06-15 LAB — MAGNESIUM: Magnesium: 1.5 mg/dL — ABNORMAL LOW (ref 1.6–2.3)

## 2021-06-15 NOTE — Telephone Encounter (Signed)
My chart message will be sent informing her to check scheduled appointments. Could not leave her a voicemail. Mailbox full.

## 2021-06-15 NOTE — Telephone Encounter (Signed)
-----   Message from Orvan July, RN sent at 06/03/2021 12:11 PM EST ----- Needs precert for sleep study.  Thank you,    [facility]

## 2021-06-16 ENCOUNTER — Telehealth: Payer: Self-pay | Admitting: General Practice

## 2021-06-16 ENCOUNTER — Telehealth (HOSPITAL_COMMUNITY): Payer: Self-pay | Admitting: Emergency Medicine

## 2021-06-16 ENCOUNTER — Other Ambulatory Visit: Payer: Self-pay | Admitting: *Deleted

## 2021-06-16 DIAGNOSIS — C50919 Malignant neoplasm of unspecified site of unspecified female breast: Secondary | ICD-10-CM

## 2021-06-16 MED ORDER — AMITRIPTYLINE HCL 75 MG PO TABS: 75.0000 mg | ORAL_TABLET | Freq: Every day | ORAL | 5 refills | Status: DC

## 2021-06-16 MED ORDER — ALPRAZOLAM 1 MG PO TABS: 1.0000 mg | ORAL_TABLET | Freq: Every day | ORAL | 5 refills | Status: DC

## 2021-06-16 NOTE — Telephone Encounter (Signed)
Patient called requesting refill on her medications. Stated that she uses mail order and needs it sent in alittle early to receive it on time before she runs out.   Pended Rx's and sent to [place] for approval.  Epic LR: 05/21/2021 Contract Date: 05/28/2021

## 2021-06-16 NOTE — Telephone Encounter (Signed)
Unable to leave vm Korri Ask RN Navigator Cardiac Imaging [facility] [phone] Office  [phone] Cell  

## 2021-06-16 NOTE — Telephone Encounter (Signed)
[place] CSW Progress Notes  Call from patient, multiple concerns about the home she shares w her husband.  Has bedbugs, is crowded, bathroom in disrepair.  She does not get much support from husband, wants to find her own place.  Would like to move out next week, but states she has multiple health concerns which would preclude her from accessing shelters in [place] due to risk of infection.  She has reached out to family members, but wants to remain in [place] in order to continue receiving medical care.  Explained that housing is very difficult to access quickly, she will need to put her name on various income based housing options (she is already on list for [facility]).  Discussed going to [facility] for [employer] counseling and general women's support as well as Museum/gallery curator for help w finding lists of available subsidized housing.  Stressed to patient that is it unlikely she will be able to find alternative housing in one weeks time, she has no income at this time.  Not Medicaid eligible, receives small amount of Food Stamps.  On social Security disability.  She will contact the above resources and expressed appreciation.  Edwyna Shell, LCSW Clinical Social Worker Phone:  [phone]

## 2021-06-17 ENCOUNTER — Other Ambulatory Visit: Payer: Self-pay

## 2021-06-17 ENCOUNTER — Ambulatory Visit (HOSPITAL_COMMUNITY)
Admission: RE | Admit: 2021-06-17 | Discharge: 2021-06-17 | Disposition: A | Discharge status: Home / Self Care | Payer: Medicare Other | Source: Ambulatory Visit | Attending: Cardiology | Admitting: Cardiology

## 2021-06-17 DIAGNOSIS — I700 Atherosclerosis of aorta: Secondary | ICD-10-CM | POA: Insufficient documentation

## 2021-06-17 DIAGNOSIS — R0609 Other forms of dyspnea: Secondary | ICD-10-CM | POA: Insufficient documentation

## 2021-06-17 DIAGNOSIS — R079 Chest pain, unspecified: Secondary | ICD-10-CM

## 2021-06-17 DIAGNOSIS — Q245 Malformation of coronary vessels: Secondary | ICD-10-CM | POA: Diagnosis not present

## 2021-06-17 MED ORDER — DILTIAZEM HCL 25 MG/5ML IV SOLN
INTRAVENOUS | Status: AC
  Filled 2021-06-17: qty 5

## 2021-06-17 MED ORDER — NITROGLYCERIN 0.4 MG SL SUBL
0.8000 mg | SUBLINGUAL_TABLET | Freq: Once | SUBLINGUAL | Status: AC
  Administered 2021-06-17: 0.8 mg via SUBLINGUAL

## 2021-06-17 MED ORDER — SODIUM CHLORIDE 0.9 % IV BOLUS
500.0000 mL | Freq: Once | INTRAVENOUS | Status: AC
  Administered 2021-06-17: 500 mL via INTRAVENOUS

## 2021-06-17 MED ORDER — DILTIAZEM HCL 25 MG/5ML IV SOLN
10.0000 mg | INTRAVENOUS | Status: DC | PRN
  Administered 2021-06-17: 10 mg via INTRAVENOUS

## 2021-06-17 MED ORDER — NITROGLYCERIN 0.4 MG SL SUBL
SUBLINGUAL_TABLET | SUBLINGUAL | Status: AC
  Filled 2021-06-17: qty 2

## 2021-06-17 MED ORDER — METOPROLOL TARTRATE 5 MG/5ML IV SOLN: 10.0000 mg | INTRAVENOUS | Status: DC | PRN

## 2021-06-17 MED ORDER — IOHEXOL 350 MG/ML SOLN
95.0000 mL | Freq: Once | INTRAVENOUS | Status: AC | PRN
  Administered 2021-06-17: 95 mL via INTRAVENOUS

## 2021-06-21 ENCOUNTER — Other Ambulatory Visit: Payer: Self-pay

## 2021-06-21 ENCOUNTER — Ambulatory Visit (INDEPENDENT_AMBULATORY_CARE_PROVIDER_SITE_OTHER): Payer: Medicare Other | Admitting: Pulmonary Disease

## 2021-06-21 ENCOUNTER — Encounter: Payer: Self-pay | Admitting: Pulmonary Disease

## 2021-06-21 VITALS — BP 130/90 | HR 113 | Temp 98.1°F | Ht 67.0 in | Wt 214.6 lb

## 2021-06-21 DIAGNOSIS — R0602 Shortness of breath: Secondary | ICD-10-CM | POA: Diagnosis not present

## 2021-06-21 DIAGNOSIS — R942 Abnormal results of pulmonary function studies: Secondary | ICD-10-CM

## 2021-06-21 MED ORDER — FLUTICASONE-SALMETEROL 250-50 MCG/ACT IN AEPB: 1.0000 | INHALATION_SPRAY | Freq: Two times a day (BID) | RESPIRATORY_TRACT | 5 refills | Status: DC

## 2021-06-21 NOTE — Progress Notes (Signed)
Subjective:   PATIENT ID: Brandy [name] GENDER: female DOB: September 23, 1965, MRN: [id number]   HPI  Chief Complaint  Patient presents with   Follow-up    Sob    Reason for Visit: Follow-up  [name] is a 56 year old female former smoker with metastatic breast cancer, obesity, chronic diastolic heart failure, chronic pain, anxiety, ASD s/p post-repair in 2006 and insomnia who presents for follow-up  Synopsis: Initially referred for evaluation of shortness of breath. PFTs with mild restrictive defect and reduced DLCO. On verzenio and faslodex for metastatic breast cancer 2021 - Methacholine challenge negative. Prior pulmonary nodules decreased in size since 02/2019, likely representing mets post-treatment. CT with no ILD. 2022 - S/p right lumpectomy in June with no breast cancer progression. Worsening dyspnea with unclear cause.  03/04/21 In the last six months, she reports shortness of breath that has gradually worsened. Some coughing, nonproductive. No wheezing. Will get lightheaded and dizzy with activity. No significant respiratory infections since our last visit. She reports possible aspiration when eating/drinking. She has reflux as well that intermittently occurs. She is enrolled in [employer] and states that the chair exercises are sometimes difficult.  05/14/21 Since our last visit she has been compliant with her [employer] however has not noticed any improvement. However her albuterol nebulizer helps and uses 1-2 times daily.. She still has shortness of breath and nonproductive cough. Her throat will burn at times. She is taking prilosec 20 mg daily.  06/21/21 Since our last visit ICS inhaler strength was increased however has not picked up due to insurance costs (~$130) which is not affordable for her. She has some chest pressure associated with shortness of breath. She has been seen by Cardiology and work-up including CT coronary neg. She reports she has orthostatic  hypotension which was confirmed at her primary care office. This will affect her breathing as she will be presyncopal along with anxiety.   Social History: Quit smoking in 2020. 1/4 ppd x 25 years.  Stressed about her husband, poor housing situation  Past Medical History:  Diagnosis Date   Acute pansinusitis 08/02/2017   Anxiety    Arthritis    ASD (atrial septal defect)    s/p closure with Amplatzer device 10/05/04 (Dr. Myriam Jacobson, [facility]) 10/05/04   Cancer ([place])    Cataract    Dyspnea    GERD (gastroesophageal reflux disease)    Heart murmur    no longer heard   History of hiatal hernia    Legally blind in right eye, as defined in [place]    Lumbar herniated disc    PONV (postoperative nausea and vomiting)    Sciatica      Allergies  Allergen Reactions   Zithromax [Azithromycin] Shortness Of Breath and Itching    TOTAL BODY ITCHING [EVEN SOLES OF FEET] WHEEZING    Other     z -pack   Psyllium Nausea And Vomiting     Outpatient Medications Prior to Visit  Medication Sig Dispense Refill   acetaminophen (TYLENOL) 500 MG tablet Take 500-1,000 mg by mouth every 6 (six) hours as needed for moderate pain or headache.     albuterol (PROVENTIL) (2.5 MG/3ML) 0.083% nebulizer solution Take 3 mLs (2.5 mg total) by nebulization every 6 (six) hours as needed for wheezing or shortness of breath. 180 mL 1   ALPRAZolam (XANAX) 1 MG tablet Take 1 tablet (1 mg total) by mouth at bedtime. 30 tablet 5   amitriptyline (ELAVIL) 75  MG tablet Take 1 tablet (75 mg total) by mouth at bedtime. 30 tablet 5   carbamide peroxide (DEBROX) 6.5 % OTIC solution Place 5 drops into the right ear 2 (two) times daily. 15 mL 0   Carboxymethylcellul-Glycerin (LUBRICATING EYE DROPS OP) Place 1 drop into the left eye 4 (four) times daily.     Cyanocobalamin (B-12 PO) Take 1 tablet by mouth daily.     diphenoxylate-atropine (LOMOTIL) 2.5-0.025 MG tablet Take 1 tablet by mouth 3 (three) times daily as needed for  diarrhea or loose stools. 30 tablet 0   loperamide (IMODIUM) 2 MG capsule Take 1-2 capsules (2-4 mg total) by mouth 4 (four) times daily as needed for diarrhea or loose stools. 30 capsule 1   metoprolol tartrate (LOPRESSOR) 100 MG tablet Take 2 hours prior to CT 1 tablet 0   Multiple Vitamin (MULTIVITAMIN WITH MINERALS) TABS tablet Take 1 tablet by mouth daily.     omeprazole (PRILOSEC OTC) 20 MG tablet Take 20 mg by mouth daily.     ondansetron (ZOFRAN) 8 MG tablet TAKE 1 TABLET(8 MG) BY MOUTH EVERY 8 HOURS AS NEEDED FOR NAUSEA OR VOMITING 30 tablet 3   rosuvastatin (CRESTOR) 10 MG tablet Take 1 tablet (10 mg total) by mouth daily. 90 tablet 3   VERZENIO 100 MG tablet TAKE 1 TABLET BY MOUTH TWICE DAILY, DO NOT CHEW,CRUSH, OR SPLIT TABLETS BEFORE SWALLOWING 56 tablet 0   Vitamin D, Ergocalciferol, (DRISDOL) 1.25 MG (50000 UNIT) CAPS capsule TAKE 1 CAPSULE ONCE PER WEEK 12 capsule 3   White Petrolatum-Mineral Oil (LUBRICANT EYE NIGHTTIME) OINT Place 1 drop into the left eye at bedtime.     No facility-administered medications prior to visit.    Review of Systems  Constitutional:  Negative for chills, diaphoresis, fever, malaise/fatigue and weight loss.  HENT:  Negative for congestion.   Respiratory:  Positive for shortness of breath. Negative for cough, hemoptysis, sputum production and wheezing.   Cardiovascular:  Positive for chest pain (chest pressure) and orthopnea. Negative for palpitations and leg swelling.  Psychiatric/Behavioral:  The patient is nervous/anxious.    Objective:   Vitals:   06/21/21 1341  BP: 130/90  Pulse: (!) 113  Temp: 98.1 F (36.7 C)  TempSrc: Oral  SpO2: 98%  Weight: 214 lb 9.6 oz (97.3 kg)  Height: 5\' 7"  (1.702 m)     Physical Exam: General: Well-appearing, no acute distress HENT: [place], AT Eyes: Right eye patch in place, left EOMI, no scleral icterus Respiratory: Clear to auscultation bilaterally.  No crackles, wheezing or rales Cardiovascular: RRR,  -M/R/G, no JVD Extremities:-Edema,-tenderness Neuro: AAO x4, CNII-XII grossly intact Psych: Normal mood, normal affect  Data Reviewed:  Imaging: CTA 10/30/19 - No pulmonary embolism. RUL 10x23mm, 68mm, unchanged. RLL nodule 32mm new CT Chest 05/21/20 - No evidence of ILD. 1.4 x 1.0 x 0.8 cm aggressive appearing nodule near the apex of the right upper lobe PET 08/07/20 - 8 mm irregular nodule in right apex improved compared to CT 2020  PFT: 12/18/19 FVC 3.13 (83%) FEV1 2.49 (92%) Ratio 88  TLC 78% DLCO 58% Interpretation: Mild restrictive defect with moderate reduction in gas exchange  02/17/20 Negative methacholine challenge  04/30/21 FVC 2.64 (85%) FEV1 2.32 (94%) Ratio 89  TLC 80% DLCO 59% Interpretation: Isolated moderate reduction in gas exchange  Echocardiogram: 09/2019 Normal EF, grade I DD, mild MR.  06/03/21 EF 60-65%. Normal diastolics. No valvular or WMA  Coronary CT 06/17/21 - Calcium score of 0  CBC    Component Value Date/Time   WBC 3.9 (L) 06/01/2021 1117   RBC 3.35 (L) 06/01/2021 1117   HGB 11.4 (L) 06/01/2021 1117   HGB 11.1 (L) 05/21/2020 1154   HCT 34.2 (L) 06/01/2021 1117   PLT 240 06/01/2021 1117   PLT 223 05/21/2020 1154   MCV 102.1 (H) 06/01/2021 1117   MCH 34.0 06/01/2021 1117   MCHC 33.3 06/01/2021 1117   RDW 13.8 06/01/2021 1117   LYMPHSABS 2.5 06/01/2021 1117   MONOABS 0.2 06/01/2021 1117   EOSABS 0.0 06/01/2021 1117   BASOSABS 0.0 06/01/2021 1117   Absolute eos  01/22/21 - 300 05/03/21 - 0 06/01/21 - 0    Assessment & Plan:   Discussion: 56 year old female former smoker (6 pack-years) with metastatic breast cancer s/p right lumpectomy with pulmonary mets who presents for follow-up. Echocardiogram normal. Low suspicion for PH at this point. Has not tried inhaler. She plans to obtain it this week and if ineffective, can discontinue.  Breo - d/c'd due to ineffectiveness  Shortness of breath - unchanged, persistent --Prior methacholine  challenge which was negative for asthma --STOP Breo due to ineffectiveness --START Advair 250-50 mcg ONE puff TWICE a day --CONTINUE Albuterol AS NEEDED for shortness of breath  Isolated moderate DLCO --Echocardiogram neg --Low suspicion for PH. If above treatment neg, could consider RHC but the diagnosis is less likely  Health Maintenance Immunization History  Administered Date(s) Administered   Influenza Inj Mdck Quad Pf 04/25/2019   Influenza,inj,Quad PF,6+ Mos 02/24/2020   Influenza,inj,quad, With Preservative 04/25/2019   Influenza-Unspecified 05/26/2021   Moderna Sars-Covid-2 Vaccination 06/29/2019, 08/03/2019, 06/24/2020   PFIZER(Purple Top)SARS-COV-2 Vaccination 05/26/2021   Pneumococcal Conjugate-13 05/29/2019   Pneumococcal Polysaccharide-23 07/29/2019   Zoster Recombinat (Shingrix) 02/17/2020, 04/20/2020   CT Lung Screen - not qualified  No orders of the defined types were placed in this encounter.  Meds ordered this encounter  Medications   fluticasone-salmeterol (ADVAIR) 250-50 MCG/ACT AEPB    Sig: Inhale 1 puff into the lungs every 12 (twelve) hours.    Dispense:  60 each    Refill:  5   Return in about 3 months (around 09/19/2021).   I have spent a total time of 33-minutes on the day of the appointment reviewing prior documentation, coordinating care and discussing medical diagnosis and plan with the patient/family. Past medical history, allergies, medications were reviewed. Pertinent imaging, labs and tests included in this note have been reviewed and interpreted independently by me.  [place], MD [facility] 06/21/2021 1:41 PM  Office Number [phone]

## 2021-06-21 NOTE — Patient Instructions (Signed)
°  Shortness of breath - unchanged, persistent --Prior methacholine challenge which was negative for asthma --STOP Breo due to ineffectiveness --START Advair 250-50 mcg ONE puff TWICE a day --CONTINUE Albuterol AS NEEDED for shortness of breath  Follow-up with me in April 2023

## 2021-06-27 ENCOUNTER — Encounter: Payer: Self-pay | Admitting: Pulmonary Disease

## 2021-06-28 ENCOUNTER — Inpatient Hospital Stay: Payer: Medicare Other

## 2021-06-28 ENCOUNTER — Inpatient Hospital Stay: Payer: Medicare Other | Admitting: Hematology

## 2021-06-28 ENCOUNTER — Other Ambulatory Visit: Payer: Medicare Other

## 2021-06-28 ENCOUNTER — Ambulatory Visit: Payer: Medicare Other

## 2021-06-28 ENCOUNTER — Inpatient Hospital Stay: Payer: Medicare Other | Attending: Hematology

## 2021-06-30 ENCOUNTER — Other Ambulatory Visit: Payer: Self-pay

## 2021-06-30 DIAGNOSIS — Z79899 Other long term (current) drug therapy: Secondary | ICD-10-CM

## 2021-06-30 MED ORDER — MAGNESIUM OXIDE 400 MG PO TABS: 400.0000 mg | ORAL_TABLET | Freq: Two times a day (BID) | ORAL | 0 refills | Status: DC

## 2021-06-30 NOTE — Progress Notes (Signed)
Prescription sent to pharmacy. Orders for lab work placed.  

## 2021-07-07 ENCOUNTER — Ambulatory Visit: Payer: Medicare Other | Admitting: Cardiology

## 2021-07-12 ENCOUNTER — Emergency Department (HOSPITAL_COMMUNITY)
Admission: EM | Admit: 2021-07-12 | Discharge: 2021-07-12 | Disposition: A | Discharge status: Home / Self Care | Payer: Medicare Other | Attending: Emergency Medicine | Admitting: Emergency Medicine

## 2021-07-12 ENCOUNTER — Emergency Department (HOSPITAL_COMMUNITY): Payer: Medicare Other

## 2021-07-12 ENCOUNTER — Encounter (HOSPITAL_COMMUNITY): Payer: Self-pay

## 2021-07-12 DIAGNOSIS — R1084 Generalized abdominal pain: Secondary | ICD-10-CM | POA: Diagnosis not present

## 2021-07-12 DIAGNOSIS — M545 Low back pain, unspecified: Secondary | ICD-10-CM

## 2021-07-12 DIAGNOSIS — Z20822 Contact with and (suspected) exposure to covid-19: Secondary | ICD-10-CM | POA: Insufficient documentation

## 2021-07-12 DIAGNOSIS — M549 Dorsalgia, unspecified: Secondary | ICD-10-CM | POA: Diagnosis present

## 2021-07-12 DIAGNOSIS — R109 Unspecified abdominal pain: Secondary | ICD-10-CM | POA: Diagnosis not present

## 2021-07-12 DIAGNOSIS — R079 Chest pain, unspecified: Secondary | ICD-10-CM | POA: Diagnosis not present

## 2021-07-12 DIAGNOSIS — C50919 Malignant neoplasm of unspecified site of unspecified female breast: Secondary | ICD-10-CM | POA: Diagnosis not present

## 2021-07-12 DIAGNOSIS — R0602 Shortness of breath: Secondary | ICD-10-CM | POA: Diagnosis not present

## 2021-07-12 DIAGNOSIS — R911 Solitary pulmonary nodule: Secondary | ICD-10-CM | POA: Diagnosis not present

## 2021-07-12 DIAGNOSIS — Z853 Personal history of malignant neoplasm of breast: Secondary | ICD-10-CM | POA: Insufficient documentation

## 2021-07-12 LAB — RESP PANEL BY RT-PCR (FLU A&B, COVID) ARPGX2
Influenza A by PCR: NEGATIVE
Influenza B by PCR: NEGATIVE
SARS Coronavirus 2 by RT PCR: NEGATIVE

## 2021-07-12 LAB — COMPREHENSIVE METABOLIC PANEL
ALT: 74 U/L — ABNORMAL HIGH (ref 0–44)
AST: 66 U/L — ABNORMAL HIGH (ref 15–41)
Albumin: 3.6 g/dL (ref 3.5–5.0)
Alkaline Phosphatase: 173 U/L — ABNORMAL HIGH (ref 38–126)
Anion gap: 7 (ref 5–15)
BUN: 10 mg/dL (ref 6–20)
CO2: 29 mmol/L (ref 22–32)
Calcium: 8.9 mg/dL (ref 8.9–10.3)
Chloride: 103 mmol/L (ref 98–111)
Creatinine, Ser: 1.1 mg/dL — ABNORMAL HIGH (ref 0.44–1.00)
GFR, Estimated: 59 mL/min — ABNORMAL LOW (ref >60)
Glucose, Bld: 85 mg/dL (ref 70–99)
Potassium: 3.4 mmol/L — ABNORMAL LOW (ref 3.5–5.1)
Sodium: 139 mmol/L (ref 135–145)
Total Bilirubin: 0.7 mg/dL (ref 0.3–1.2)
Total Protein: 7.5 g/dL (ref 6.5–8.1)

## 2021-07-12 LAB — CBC WITH DIFFERENTIAL/PLATELET
Abs Immature Granulocytes: 0.01 10*3/uL (ref 0.00–0.07)
Basophils Absolute: 0 10*3/uL (ref 0.0–0.1)
Basophils Relative: 0 %
Eosinophils Absolute: 0 10*3/uL (ref 0.0–0.5)
Eosinophils Relative: 1 %
HCT: 33.7 % — ABNORMAL LOW (ref 36.0–46.0)
Hemoglobin: 11.3 g/dL — ABNORMAL LOW (ref 12.0–15.0)
Immature Granulocytes: 0 %
Lymphocytes Relative: 59 %
Lymphs Abs: 3.3 10*3/uL (ref 0.7–4.0)
MCH: 34.7 pg — ABNORMAL HIGH (ref 26.0–34.0)
MCHC: 33.5 g/dL (ref 30.0–36.0)
MCV: 103.4 fL — ABNORMAL HIGH (ref 80.0–100.0)
Monocytes Absolute: 0.5 10*3/uL (ref 0.1–1.0)
Monocytes Relative: 9 %
Neutro Abs: 1.7 10*3/uL (ref 1.7–7.7)
Neutrophils Relative %: 31 %
Platelets: 232 10*3/uL (ref 150–400)
RBC: 3.26 MIL/uL — ABNORMAL LOW (ref 3.87–5.11)
RDW: 15.2 % (ref 11.5–15.5)
WBC: 5.5 10*3/uL (ref 4.0–10.5)
nRBC: 0 % (ref 0.0–0.2)

## 2021-07-12 LAB — LIPASE, BLOOD: Lipase: 35 U/L (ref 11–51)

## 2021-07-12 LAB — PROTIME-INR
INR: 1 (ref 0.8–1.2)
Prothrombin Time: 12.8 seconds (ref 11.4–15.2)

## 2021-07-12 MED ORDER — MORPHINE SULFATE (PF) 2 MG/ML IV SOLN
2.0000 mg | Freq: Once | INTRAVENOUS | Status: AC
Start: 2021-07-12 — End: 2021-07-12
  Administered 2021-07-12: 2 mg via INTRAVENOUS
  Filled 2021-07-12: qty 1

## 2021-07-12 MED ORDER — HYDROCODONE-ACETAMINOPHEN 5-325 MG PO TABS: 1.0000 | ORAL_TABLET | Freq: Four times a day (QID) | ORAL | 0 refills | Status: DC | PRN

## 2021-07-12 MED ORDER — HYDROCODONE-ACETAMINOPHEN 5-325 MG PO TABS
1.0000 | ORAL_TABLET | Freq: Once | ORAL | Status: AC
  Administered 2021-07-12: 1 via ORAL
  Filled 2021-07-12: qty 1

## 2021-07-12 MED ORDER — IOHEXOL 350 MG/ML SOLN
80.0000 mL | Freq: Once | INTRAVENOUS | Status: AC | PRN
  Administered 2021-07-12: 80 mL via INTRAVENOUS

## 2021-07-12 MED ORDER — SODIUM CHLORIDE (PF) 0.9 % IJ SOLN
INTRAMUSCULAR | Status: AC
  Filled 2021-07-12: qty 50

## 2021-07-12 NOTE — ED Triage Notes (Signed)
Pt presents to the ED via EMS for abd pain radiating to the right lower back which began just prior to arrival. Pt is a cancer pt, being treated for breast cancer with metastases to the lungs. Pt is no longer undergoing chemo or radiation. She states she is undergoing treatment with "injections" for her cancer. Pt states she is SOB, however she states this is secondary to pain. Pt has a hx of sciatica. Pt lives at home with her son and states she was in her shed "looking for something" when her pain came on suddenly.

## 2021-07-12 NOTE — ED Provider Notes (Signed)
[facility] DEPT Provider Note   CSN: [id number] Arrival date & time: 07/12/21  1223     History  Chief Complaint  Patient presents with   Abdominal Pain    Back pain    Brandy [name] is a 56 y.o. female.  56 year old female with prior medical history as detailed below presents for evaluation.  Patient complains of pain to the right posterior flank.  Patient reports the pain began acutely this morning while she was lifting and moving things that she had at her house.  She complains of some mild shortness of breath earlier.    Patient denies chest pain.  She denies significant shortness of breath at the time of exam.  The history is provided by the patient and medical records.  Illness Location:  Right-sided mid back pain Severity:  Mild Onset quality:  Gradual Duration:  4 hours Timing:  Constant Progression:  Waxing and waning Chronicity:  New     Home Medications Prior to Admission medications   Medication Sig Start Date End Date Taking? Authorizing Provider  HYDROcodone-acetaminophen (NORCO/VICODIN) 5-325 MG tablet Take 1 tablet by mouth every 6 (six) hours as needed. 07/12/21  Yes Valarie Merino, MD  acetaminophen (TYLENOL) 500 MG tablet Take 500-1,000 mg by mouth every 6 (six) hours as needed for moderate pain or headache.    [provider]  albuterol (PROVENTIL) (2.5 MG/3ML) 0.083% nebulizer solution Take 3 mLs (2.5 mg total) by nebulization every 6 (six) hours as needed for wheezing or shortness of breath. 04/19/21   Margaretha Seeds, MD  ALPRAZolam [name]) 1 MG tablet Take 1 tablet (1 mg total) by mouth at bedtime. 06/16/21   Ngetich, Dinah C, NP  amitriptyline (ELAVIL) 75 MG tablet Take 1 tablet (75 mg total) by mouth at bedtime. 06/16/21   Ngetich, Dinah C, NP  carbamide peroxide (DEBROX) 6.5 % OTIC solution Place 5 drops into the right ear 2 (two) times daily. 05/28/21   Ngetich, Dinah C, NP   Carboxymethylcellul-Glycerin (LUBRICATING EYE DROPS OP) Place 1 drop into the left eye 4 (four) times daily.    [provider]  Cyanocobalamin (B-12 PO) Take 1 tablet by mouth daily.    [provider]  diphenoxylate-atropine (LOMOTIL) 2.5-0.025 MG tablet Take 1 tablet by mouth 3 (three) times daily as needed for diarrhea or loose stools. 04/20/21   Brunetta Genera, MD  fluticasone-salmeterol (ADVAIR) 250-50 MCG/ACT AEPB Inhale 1 puff into the lungs every 12 (twelve) hours. 06/21/21   Margaretha Seeds, MD  loperamide (IMODIUM) 2 MG capsule Take 1-2 capsules (2-4 mg total) by mouth 4 (four) times daily as needed for diarrhea or loose stools. 05/10/21   Brunetta Genera, MD  magnesium oxide (MAG-OX) 400 MG tablet Take 1 tablet (400 mg total) by mouth 2 (two) times daily. 06/30/21   Tobb, Kardie, DO  metoprolol tartrate (LOPRESSOR) 100 MG tablet Take 2 hours prior to CT 06/03/21   Tobb, Kardie, DO  Multiple Vitamin (MULTIVITAMIN WITH MINERALS) TABS tablet Take 1 tablet by mouth daily.    [provider]  omeprazole (PRILOSEC OTC) 20 MG tablet Take 20 mg by mouth daily.    [provider]  ondansetron (ZOFRAN) 8 MG tablet TAKE 1 TABLET(8 MG) BY MOUTH EVERY 8 HOURS AS NEEDED FOR NAUSEA OR VOMITING 04/19/21   Brunetta Genera, MD  rosuvastatin (CRESTOR) 10 MG tablet Take 1 tablet (10 mg total) by mouth daily. 06/10/21 09/08/21  Ngetich, [employer]  C, NP  VERZENIO 100 MG tablet TAKE 1 TABLET BY MOUTH TWICE DAILY, DO NOT CHEW,CRUSH, OR SPLIT TABLETS BEFORE SWALLOWING 06/15/21   Brunetta Genera, MD  Vitamin D, Ergocalciferol, (DRISDOL) 1.25 MG (50000 UNIT) CAPS capsule TAKE 1 CAPSULE ONCE PER WEEK 05/04/21   Brunetta Genera, MD  White Petrolatum-Mineral Oil (LUBRICANT EYE NIGHTTIME) OINT Place 1 drop into the left eye at bedtime.    [provider]      Allergies    Zithromax [azithromycin], Other, and Psyllium    Review of Systems   Review of  Systems  All other systems reviewed and are negative.  Physical Exam Updated Vital Signs BP (!) 150/84    Pulse 81    Temp 98.3 F (36.8 C) (Oral)    Resp 16    Ht 5\' 7"  (1.702 m)    Wt 97.1 kg    SpO2 100%    BMI 33.52 kg/m  Physical Exam Vitals and nursing note reviewed.  Constitutional:      General: She is not in acute distress.    Appearance: Normal appearance. She is well-developed.  HENT:     Head: Normocephalic and atraumatic.  Eyes:     Conjunctiva/sclera: Conjunctivae normal.     Pupils: Pupils are equal, round, and reactive to light.  Cardiovascular:     Rate and Rhythm: Normal rate and regular rhythm.     Heart sounds: Normal heart sounds.  Pulmonary:     Effort: Pulmonary effort is normal. No respiratory distress.     Breath sounds: Normal breath sounds.  Abdominal:     General: There is no distension.     Palpations: Abdomen is soft.     Tenderness: There is no abdominal tenderness.  Musculoskeletal:        General: No deformity. Normal range of motion.     Cervical back: Normal range of motion and neck supple.  Skin:    General: Skin is warm and dry.  Neurological:     General: No focal deficit present.     Mental Status: She is alert and oriented to person, place, and time.    ED Results / Procedures / Treatments   Labs (all labs ordered are listed, but only abnormal results are displayed) Labs Reviewed  COMPREHENSIVE METABOLIC PANEL - Abnormal; Notable for the following components:      Result Value   Potassium 3.4 (*)    Creatinine, Ser 1.10 (*)    AST 66 (*)    ALT 74 (*)    Alkaline Phosphatase 173 (*)    GFR, Estimated 59 (*)    All other components within normal limits  CBC WITH DIFFERENTIAL/PLATELET - Abnormal; Notable for the following components:   RBC 3.26 (*)    Hemoglobin 11.3 (*)    HCT 33.7 (*)    MCV 103.4 (*)    MCH 34.7 (*)    All other components within normal limits  RESP PANEL BY RT-PCR (FLU A&B, COVID) ARPGX2  LIPASE,  BLOOD  PROTIME-INR    EKG EKG Interpretation  Date/Time:  Monday July 12 2021 12:36:38 EST Ventricular Rate:  85 PR Interval:  145 QRS Duration: 90 QT Interval:  374 QTC Calculation: 445 R Axis:   78 Text Interpretation: Sinus rhythm Baseline wander in lead(s) II Confirmed by Dene Gentry [phone]) on 07/12/2021 1:12:02 PM  Radiology CT Angio Chest PE W and/or Wo Contrast  Result Date: 07/12/2021 CLINICAL DATA:  Abdominal pain radiating to the  right lower back, breast cancer metastatic to lungs. EXAM: CT ANGIOGRAPHY CHEST WITH CONTRAST TECHNIQUE: Multidetector CT imaging of the chest was performed using the standard protocol during bolus administration of intravenous contrast. Multiplanar CT image reconstructions and MIPs were obtained to evaluate the vascular anatomy. RADIATION DOSE REDUCTION: This exam was performed according to the departmental dose-optimization program which includes automated exposure control, adjustment of the mA and/or kV according to patient size and/or use of iterative reconstruction technique. CONTRAST:  6mL OMNIPAQUE IOHEXOL 350 MG/ML SOLN COMPARISON:  05/21/2020 and PET 08/07/2020. FINDINGS: Cardiovascular: Negative for pulmonary embolus. Enlarged pulmonic trunk. Heart is at the upper limits of normal in size to mildly enlarged. No pericardial effusion. Mediastinum/Nodes: No pathologically enlarged mediastinal, hilar or axillary lymph nodes. Esophagus is unremarkable. Lungs/Pleura: Image quality is markedly degraded by expiratory phase imaging, creating mosaic attenuation in the lungs. Scattered pulmonary nodules appear grossly stable, measuring up to 8 x 12 mm in the apical segment right upper lobe (6/31). No pleural fluid. Airway is otherwise unremarkable. Upper Abdomen: Visualized portions of the liver gallbladder, adrenal glands, kidneys, spleen pancreas, stomach and bowel are grossly unremarkable. Musculoskeletal: Degenerative changes in the spine. No worrisome  lytic or sclerotic lesions. Probable postoperative seroma in the right breast, measuring 2.8 x 7.7 cm. Review of the MIP images confirms the above findings. IMPRESSION: 1. Negative for pulmonary embolus. 2. Grossly stable pulmonary metastases, likely quiescent when compared with PET 08/07/2020. 3. Enlarged pulmonic trunk, indicative of pulmonary arterial hypertension. Electronically Signed   By: Lorin Picket M.D.   On: 07/12/2021 13:59    Procedures Procedures    Medications Ordered in ED Medications  sodium chloride (PF) 0.9 % injection (  Not Given 07/12/21 1333)  HYDROcodone-acetaminophen (NORCO/VICODIN) 5-325 MG per tablet 1 tablet (has no administration in time range)  morphine (PF) 2 MG/ML injection 2 mg (2 mg Intravenous Given 07/12/21 1305)  iohexol (OMNIPAQUE) 350 MG/ML injection 80 mL (80 mLs Intravenous Contrast Given 07/12/21 1329)    ED Course/ Medical Decision Making/ A&P                           Medical Decision Making Amount and/or Complexity of Data Reviewed Labs: ordered. Radiology: ordered.  Risk Prescription drug management.    Medical Screen Complete  This patient presented to the ED with complaint of back pain / flank pain.  This complaint involves an extensive number of treatment options. The initial differential diagnosis includes, but is not limited to, PE, pulmonary infection, metabolic abnormality,etc.  This presentation is: Acute, Self-Limited, Previously Undiagnosed, Uncertain Prognosis, Complicated, Systemic Symptoms, and Threat to Life/Bodily Function  Patient is presenting with complaint of right-sided flank pain.  Patient's pain appears to have begun after lifting.  Patient with significant comorbidities including history of breast cancer.  Out of an abundance of caution, a PE protocol was followed for rule out.  CTA does not show evidence of PE.  Other screening labs are without significant abnormality.  Patient does not feel improved.   She does understand need for close follow-up.  Strict return precautions given and understood.    Co morbidities that complicated the patient's evaluation  History of breast cancer   Additional history obtained:  External records from outside sources obtained and reviewed including prior ED visits and prior Inpatient records.    Lab Tests:  I ordered and personally interpreted labs.  The pertinent results include:  cbc bmp lipase covid  Imaging Studies ordered:  I ordered imaging studies including CTA PE  I independently visualized and interpreted obtained imaging which showed NAD I agree with the radiologist interpretation.   Cardiac Monitoring:  The patient was maintained on a cardiac monitor.  I personally viewed and interpreted the cardiac monitor which showed an underlying rhythm of: NSR   Medicines ordered:  I ordered medication including morphine  for pain  Reevaluation of the patient after these medicines showed that the patient: improved    Problem List / ED Course:  Back pain   Reevaluation:  After the interventions noted above, I reevaluated the patient and found that they have: improved    Disposition:  After consideration of the diagnostic results and the patients response to treatment, I feel that the patent would benefit from close outpatient follow up.          Final Clinical Impression(s) / ED Diagnoses Final diagnoses:  Low back pain without sciatica, unspecified back pain laterality, unspecified chronicity    Rx / DC Orders ED Discharge Orders          Ordered    HYDROcodone-acetaminophen (NORCO/VICODIN) 5-325 MG tablet  Every 6 hours PRN        07/12/21 1517              Valarie Merino, MD 07/12/21 1528

## 2021-07-12 NOTE — Discharge Instructions (Signed)
Return for any problem.  ?

## 2021-07-12 NOTE — ED Notes (Signed)
EDP at the bedside.  ?

## 2021-07-13 ENCOUNTER — Telehealth: Payer: Self-pay | Admitting: Hematology

## 2021-07-13 NOTE — Telephone Encounter (Signed)
Sch per 2/6 inbasket, left msg

## 2021-07-14 ENCOUNTER — Ambulatory Visit (HOSPITAL_BASED_OUTPATIENT_CLINIC_OR_DEPARTMENT_OTHER): Payer: Medicare Other | Attending: Cardiology | Admitting: Cardiovascular Disease

## 2021-07-14 ENCOUNTER — Other Ambulatory Visit: Payer: Self-pay

## 2021-07-14 DIAGNOSIS — G4733 Obstructive sleep apnea (adult) (pediatric): Secondary | ICD-10-CM | POA: Diagnosis not present

## 2021-07-14 DIAGNOSIS — E669 Obesity, unspecified: Secondary | ICD-10-CM | POA: Insufficient documentation

## 2021-07-14 DIAGNOSIS — R400 Somnolence: Secondary | ICD-10-CM | POA: Insufficient documentation

## 2021-07-14 DIAGNOSIS — Z6834 Body mass index (BMI) 34.0-34.9, adult: Secondary | ICD-10-CM | POA: Insufficient documentation

## 2021-07-14 DIAGNOSIS — R5383 Other fatigue: Secondary | ICD-10-CM | POA: Insufficient documentation

## 2021-07-14 DIAGNOSIS — R519 Headache, unspecified: Secondary | ICD-10-CM | POA: Diagnosis not present

## 2021-07-14 DIAGNOSIS — R0683 Snoring: Secondary | ICD-10-CM | POA: Diagnosis not present

## 2021-07-16 ENCOUNTER — Telehealth: Payer: Self-pay

## 2021-07-16 NOTE — Telephone Encounter (Signed)
Brandy [name] 04-21-2066 called stating she was seen by Dinah and went to ER Tuesday for sciatica/pulled muscle. Morphine didn't work, Vicodin 5-325 taking  not to run out. Need it, but it's not helping the pain. Prev. Treatment of prednisone taper with pain med has worked in the past. She is also requesting referral to [place] pain clinic to get injections. Patient [phone]  To Dinah

## 2021-07-19 ENCOUNTER — Other Ambulatory Visit: Payer: Self-pay | Admitting: *Deleted

## 2021-07-19 ENCOUNTER — Other Ambulatory Visit: Payer: Self-pay

## 2021-07-19 ENCOUNTER — Telehealth (INDEPENDENT_AMBULATORY_CARE_PROVIDER_SITE_OTHER): Payer: Medicare Other | Admitting: Family

## 2021-07-19 DIAGNOSIS — C50919 Malignant neoplasm of unspecified site of unspecified female breast: Secondary | ICD-10-CM | POA: Diagnosis not present

## 2021-07-19 DIAGNOSIS — M5416 Radiculopathy, lumbar region: Secondary | ICD-10-CM | POA: Diagnosis not present

## 2021-07-19 DIAGNOSIS — C50911 Malignant neoplasm of unspecified site of right female breast: Secondary | ICD-10-CM

## 2021-07-19 MED ORDER — AMITRIPTYLINE HCL 75 MG PO TABS: 75.0000 mg | ORAL_TABLET | Freq: Every day | ORAL | 1 refills | Status: DC

## 2021-07-19 MED ORDER — PREDNISONE 20 MG PO TABS: ORAL_TABLET | ORAL | 0 refills | Status: AC

## 2021-07-19 NOTE — Telephone Encounter (Signed)
MyChart Visit scheduled with patient to discuss concerns today 07/19/2021.

## 2021-07-19 NOTE — Progress Notes (Addendum)
L Provider: Suhan Paci FNP-C  Dianna Ewald, Nelda Bucks, NP  Patient Care Team: Aron Needles, Nelda Bucks, NP as PCP - General (Family Medicine) Berniece Salines, DO as PCP - Cardiology (Cardiology) Everardo Beals, NP as Nurse Practitioner  Extended Emergency Contact Information Primary Emergency Contact: [place] Address: [address]          [place], [place] [address] Johnnette Litter of [place] Phone: [phone] Relation: Spouse Secondary Emergency Contact: [place] Address: [address]          [place], [place] [address] Johnnette Litter of [place] Phone: [phone] Relation: Daughter  Code Status:  Full Code  Goals of care: Advanced Directive information Advanced Directives 07/14/2021  Does Patient Have a Medical Advance Directive? No  Would patient like information on creating a medical advance directive? Yes (MAU/Ambulatory/Procedural Areas - Information given)   This service is provided via telemedicine Video   No vital signs collected/recorded due to the encounter was a telemedicine visit.   Location of patient (ex: home, work):  Home   Patient consents to a video visit:  Yes   Location of the provider (ex: office, home):  [facility] office   Name of any referring provider:  PCP   Names of all persons participating in the telemedicine service and their role in the encounter:  Roxi E.Wisner    Chief Complaint  Patient presents with   Acute Visit    Patient presents today for a possible medication change.    HPI:  Pt is a 56 y.o. female seen today for an acute visit for lower back pain and sciatic nerve pain.States was seen in the ED 07/12/2021 for pain.she was prescribed Vicodin 5-325 mg tablet one by mouth every 6 Hrs PRN but not helping.States [previous PCP Joelene Millin used to prescribe Vicodin and Prednisone which seemed to help with the pain.sh would like to know if provider can prescribe the same too. States was also following up with [facility] but needs referral.Pain management gave cortisol injection to the back which helped. Pain is described as burning and numb sensation.  Has had Physical therapy in the past but states knows the exercise that she was advised to do.plans to start on exercise walking video classes.Also will be starting on silver Sneakers exercise in the water.  She request Elavil refill to be send to mailing pharmacy [facility].   Past Medical History:  Diagnosis Date   Acute pansinusitis 08/02/2017   Anxiety    Arthritis    ASD (atrial septal defect)    s/p closure with Amplatzer device 10/05/04 (Dr. Myriam Jacobson, [place]) 10/05/04   Cancer ([place])    Cataract    Dyspnea    GERD (gastroesophageal reflux disease)    Heart murmur    no longer heard   History of hiatal hernia    Legally blind in right eye, as defined in [place]    Lumbar herniated disc    PONV (postoperative nausea and vomiting)    Sciatica    Past Surgical History:  Procedure Laterality Date   ABLATION     BREAST LUMPECTOMY WITH RADIOACTIVE SEED AND SENTINEL LYMPH NODE BIOPSY Right 11/23/2020   Procedure: RIGHT BREAST LUMPECTOMY WITH RADIOACTIVE SEED AND RIGHT AXILLARY SENTINEL LYMPH NODE BIOPSY;  Surgeon: Rolm Bookbinder, MD;  Location: [facility];  Service: General;  Laterality: Right;   BREAST SURGERY Bilateral 2011   Breast Reduction Surgery   BUNIONECTOMY     CARDIAC CATHETERIZATION     10/05/04 [facility]):  LM < 25%, otherwise normal coronaries. No pulmonary HTN, Mildly enlarged RV. Secundum ASD s/p closure.   CARDIAC SURGERY     CATARACT EXTRACTION     CLEFT PALATE REPAIR     s/p cleft lip and palate repair   EYE SURGERY Right 2019   right eye removed   LUMBAR LAMINECTOMY/DECOMPRESSION MICRODISCECTOMY Left 05/23/2016   Procedure: LEFT L4-L5 LATERAL RECESS DECOMPRESSION WITH CENTRAL AND RIGHT DECOMPRESSION VIA LEFT SIDE;  Surgeon: [name], MD;  Location: [facility];  Service: Orthopedics;  Laterality: Left;   LUMBAR  LAMINECTOMY/DECOMPRESSION MICRODISCECTOMY Left 05/23/2016   Procedure: LUMBAR LAMINECTOMY/DECOMPRESSION MICRODISCECTOMY Lumbar five - Sacral One 1 LEVEL;  Surgeon: [name], MD;  Location: [facility];  Service: Orthopedics;  Laterality: Left;   REDUCTION MAMMAPLASTY  2011   SHOULDER INJECTION Left 05/23/2016   Procedure: SHOULDER INJECTION;  Surgeon: [name], MD;  Location: [facility];  Service: Orthopedics;  Laterality: Left;  band-aid per pa-c   TRANSTHORACIC ECHOCARDIOGRAM     12/15/05 [facility]): Mild LVH, EF > 54%, grade 1 diastolic dysfunction, Trivial MR/PR/TR.   TUBAL LIGATION      Allergies  Allergen Reactions   Zithromax [Azithromycin] Shortness Of Breath and Itching    TOTAL BODY ITCHING [EVEN SOLES OF FEET] WHEEZING    Other     z -pack   Psyllium Nausea And Vomiting    Outpatient Encounter Medications as of 07/19/2021  Medication Sig   acetaminophen (TYLENOL) 500 MG tablet Take 500-1,000 mg by mouth every 6 (six) hours as needed for moderate pain or headache.   albuterol (PROVENTIL) (2.5 MG/3ML) 0.083% nebulizer solution Take 3 mLs (2.5 mg total) by nebulization every 6 (six) hours as needed for wheezing or shortness of breath.   ALPRAZolam (XANAX) 1 MG tablet Take 1 tablet (1 mg total) by mouth at bedtime.   amitriptyline (ELAVIL) 75 MG tablet Take 1 tablet (75 mg total) by mouth at bedtime.   carbamide peroxide (DEBROX) 6.5 % OTIC solution Place 5 drops into the right ear 2 (two) times daily.   Carboxymethylcellul-Glycerin (LUBRICATING EYE DROPS OP) Place 1 drop into the left eye 4 (four) times daily.   Cyanocobalamin (B-12 PO) Take 1 tablet by mouth daily.   diphenoxylate-atropine (LOMOTIL) 2.5-0.025 MG tablet Take 1 tablet by mouth 3 (three) times daily as needed for diarrhea or loose stools.   fluticasone-salmeterol (ADVAIR) 250-50 MCG/ACT AEPB Inhale 1 puff into the lungs every 12 (twelve) hours.   HYDROcodone-acetaminophen (NORCO/VICODIN) 5-325 MG tablet Take 1 tablet  by mouth every 6 (six) hours as needed.   loperamide (IMODIUM) 2 MG capsule Take 1-2 capsules (2-4 mg total) by mouth 4 (four) times daily as needed for diarrhea or loose stools.   magnesium oxide (MAG-OX) 400 MG tablet Take 1 tablet (400 mg total) by mouth 2 (two) times daily.   metoprolol tartrate (LOPRESSOR) 100 MG tablet Take 2 hours prior to CT   Multiple Vitamin (MULTIVITAMIN WITH MINERALS) TABS tablet Take 1 tablet by mouth daily.   omeprazole (PRILOSEC OTC) 20 MG tablet Take 20 mg by mouth daily.   ondansetron (ZOFRAN) 8 MG tablet TAKE 1 TABLET(8 MG) BY MOUTH EVERY 8 HOURS AS NEEDED FOR NAUSEA OR VOMITING   rosuvastatin (CRESTOR) 10 MG tablet Take 1 tablet (10 mg total) by mouth daily.   VERZENIO 100 MG tablet TAKE 1 TABLET BY MOUTH TWICE DAILY, DO NOT CHEW,CRUSH, OR SPLIT TABLETS BEFORE SWALLOWING   Vitamin D, Ergocalciferol, (DRISDOL) 1.25 MG (50000  UNIT) CAPS capsule TAKE 1 CAPSULE ONCE PER WEEK   White Petrolatum-Mineral Oil (LUBRICANT EYE NIGHTTIME) OINT Place 1 drop into the left eye at bedtime.   No facility-administered encounter medications on file as of 07/19/2021.    Review of Systems  Constitutional:  Negative for chills, fatigue and fever.  Eyes:  Positive for visual disturbance.       Right eye blind has upcoming right eye surgery at [facility].   Respiratory:  Negative for cough, chest tightness, shortness of breath and wheezing.   Cardiovascular:  Negative for chest pain, palpitations and leg swelling.  Gastrointestinal:  Negative for abdominal distention, abdominal pain, constipation, nausea and vomiting.  Musculoskeletal:  Positive for arthralgias, back pain and gait problem. Negative for myalgias, neck pain and neck stiffness.  Neurological:  Negative for dizziness, weakness, numbness and headaches.   Immunization History  Administered Date(s) Administered   Influenza Inj Mdck Quad Pf 04/25/2019   Influenza,inj,Quad PF,6+ Mos 02/24/2020   Influenza,inj,quad, With  Preservative 04/25/2019   Influenza-Unspecified 05/26/2021   Moderna Sars-Covid-2 Vaccination 06/29/2019, 08/03/2019, 06/24/2020   PFIZER(Purple Top)SARS-COV-2 Vaccination 05/26/2021   Pneumococcal Conjugate-13 05/29/2019   Pneumococcal Polysaccharide-23 07/29/2019   Zoster Recombinat (Shingrix) 02/17/2020, 04/20/2020   Pertinent  Health Maintenance Due  Topic Date Due   PAP SMEAR-Modifier  Never done   MAMMOGRAM  09/03/2022   COLONOSCOPY (Pts 45-9yrs Insurance coverage will need to be confirmed)  08/16/2027   INFLUENZA VACCINE  Completed   Fall Risk 03/08/2021 05/03/2021 05/28/2021 07/12/2021 07/19/2021  Falls in the past year? - - 0 - 0  Number of falls in past year - - - - -  Was there an injury with Fall? - - 0 - 0  Was there an injury with Fall? - - - - -  Fall Risk Category Calculator - - 0 - 0  Fall Risk Category - - Low - Low  Patient Fall Risk Level Low fall risk High fall risk Low fall risk Low fall risk Low fall risk  Patient at Risk for Falls Due to - - No Fall Risks - No Fall Risks  Fall risk Follow up - - Falls evaluation completed - Falls evaluation completed;Education provided;Falls prevention discussed   Functional Status Survey:    There were no vitals filed for this visit. There is no height or weight on file to calculate BMI. Physical Exam Constitutional:      General: She is not in acute distress.    Appearance: She is not ill-appearing.  Eyes:     Comments: Wearing right eye patch during visit   Pulmonary:     Effort: Pulmonary effort is normal. No respiratory distress.  Neurological:     Mental Status: She is alert and oriented to person, place, and time.  Psychiatric:        Mood and Affect: Mood normal.        Behavior: Behavior normal.    Labs reviewed: Recent Labs    06/01/21 1117 06/14/21 1431 07/12/21 1246  NA 138 143 139  K 3.9 4.7 3.4*  CL 104 102 103  CO2 26 25 29   GLUCOSE 116* 88 85  BUN 11 13 10   CREATININE 1.17* 1.48* 1.10*   CALCIUM 8.9 9.6 8.9  MG  --  1.5*  --    Recent Labs    05/03/21 1108 06/01/21 1117 07/12/21 1246  AST 151* 52* 66*  ALT 112* 67* 74*  ALKPHOS 178* 158* 173*  BILITOT 0.7 0.5 0.7  PROT 7.7 7.2 7.5  ALBUMIN 3.6 3.7 3.6   Recent Labs    05/03/21 1108 06/01/21 1117 07/12/21 1246  WBC 3.3* 3.9* 5.5  NEUTROABS 0.9* 1.2* 1.7  HGB 11.3* 11.4* 11.3*  HCT 33.1* 34.2* 33.7*  MCV 101.8* 102.1* 103.4*  PLT 223 240 232   Lab Results  Component Value Date   TSH 0.91 05/28/2021   No results found for: HGBA1C Lab Results  Component Value Date   CHOL 291 (H) 05/28/2021   HDL 87 05/28/2021   LDLCALC 172 (H) 05/28/2021   TRIG 167 (H) 05/28/2021   CHOLHDL 3.3 05/28/2021    Significant Diagnostic Results in last 30 days:  CT Angio Chest PE W and/or Wo Contrast  Result Date: 07/12/2021 CLINICAL DATA:  Abdominal pain radiating to the right lower back, breast cancer metastatic to lungs. EXAM: CT ANGIOGRAPHY CHEST WITH CONTRAST TECHNIQUE: Multidetector CT imaging of the chest was performed using the standard protocol during bolus administration of intravenous contrast. Multiplanar CT image reconstructions and MIPs were obtained to evaluate the vascular anatomy. RADIATION DOSE REDUCTION: This exam was performed according to the departmental dose-optimization program which includes automated exposure control, adjustment of the mA and/or kV according to patient size and/or use of iterative reconstruction technique. CONTRAST:  36mL OMNIPAQUE IOHEXOL 350 MG/ML SOLN COMPARISON:  05/21/2020 and PET 08/07/2020. FINDINGS: Cardiovascular: Negative for pulmonary embolus. Enlarged pulmonic trunk. Heart is at the upper limits of normal in size to mildly enlarged. No pericardial effusion. Mediastinum/Nodes: No pathologically enlarged mediastinal, hilar or axillary lymph nodes. Esophagus is unremarkable. Lungs/Pleura: Image quality is markedly degraded by expiratory phase imaging, creating mosaic attenuation in  the lungs. Scattered pulmonary nodules appear grossly stable, measuring up to 8 x 12 mm in the apical segment right upper lobe (6/31). No pleural fluid. Airway is otherwise unremarkable. Upper Abdomen: Visualized portions of the liver gallbladder, adrenal glands, kidneys, spleen pancreas, stomach and bowel are grossly unremarkable. Musculoskeletal: Degenerative changes in the spine. No worrisome lytic or sclerotic lesions. Probable postoperative seroma in the right breast, measuring 2.8 x 7.7 cm. Review of the MIP images confirms the above findings. IMPRESSION: 1. Negative for pulmonary embolus. 2. Grossly stable pulmonary metastases, likely quiescent when compared with PET 08/07/2020. 3. Enlarged pulmonic trunk, indicative of pulmonary arterial hypertension. Electronically Signed   By: Lorin Picket M.D.   On: 07/12/2021 13:59    Assessment/Plan 1. Lumbar back pain with radiculopathy affecting right lower extremity Status post ED visit for evaluation 07/12/2021 Vicodin prescribed states half tablet not working request higher dose. Recommended tapered Prednisone as below. - refer to pain management.Request [facility].  - predniSONE (DELTASONE) 20 MG tablet; Take 2 tablets (40 mg total) by mouth daily with breakfast for 1 day, THEN 1.5 tablets (30 mg total) daily with breakfast for 1 day, THEN 1 tablet (20 mg total) daily with breakfast for 1 day, THEN 0.5 tablets (10 mg total) daily with breakfast for 1 day.  Dispense: 5 tablet; Refill: 0 - Ambulatory referral to Pain Clinic  2. Metastatic breast cancer ([place]) Request 90 day supply of El[place]ail to be send to [facility] much cheaper than local pharmacy.  - amitriptyline (ELAVIL) 75 MG tablet; Take 1 tablet (75 mg total) by mouth at bedtime.  Dispense: 90 tablet; Refill: 1  Family/ staff Communication: Reviewed plan of care with patient verbalized understanding.  Labs/tests ordered: None   Next Appointment: As needed if symptoms  worsen or fail to improve  I connected with  DIMPLE BASTYR on 07/19/21 by a video enabled telemedicine application and verified that I am speaking with the correct person using two identifiers.   I discussed the limitations of evaluation and management by telemedicine. The patient expressed understanding and agreed to proceed.  Spent 12 minutes of non-face to face with patient  >50% time spent counseling; reviewing medical record; tests; labs; and developing future plan of care.   Sandrea Hughs, NP

## 2021-07-20 ENCOUNTER — Other Ambulatory Visit: Payer: Self-pay

## 2021-07-20 ENCOUNTER — Inpatient Hospital Stay: Payer: Medicare Other

## 2021-07-20 ENCOUNTER — Inpatient Hospital Stay: Payer: Medicare Other | Attending: Hematology | Admitting: Hematology

## 2021-07-20 VITALS — BP 82/47 | HR 93 | Temp 98.0°F | Resp 19 | Ht 67.0 in | Wt 209.0 lb

## 2021-07-20 DIAGNOSIS — C50511 Malignant neoplasm of lower-outer quadrant of right female breast: Secondary | ICD-10-CM | POA: Insufficient documentation

## 2021-07-20 DIAGNOSIS — C50919 Malignant neoplasm of unspecified site of unspecified female breast: Secondary | ICD-10-CM

## 2021-07-20 DIAGNOSIS — R251 Tremor, unspecified: Secondary | ICD-10-CM | POA: Diagnosis not present

## 2021-07-20 DIAGNOSIS — Z9181 History of falling: Secondary | ICD-10-CM | POA: Insufficient documentation

## 2021-07-20 DIAGNOSIS — Z170 Estrogen receptor positive status [ER+]: Secondary | ICD-10-CM | POA: Diagnosis not present

## 2021-07-20 DIAGNOSIS — Z5111 Encounter for antineoplastic chemotherapy: Secondary | ICD-10-CM | POA: Insufficient documentation

## 2021-07-20 DIAGNOSIS — Z79899 Other long term (current) drug therapy: Secondary | ICD-10-CM | POA: Diagnosis not present

## 2021-07-20 DIAGNOSIS — K760 Fatty (change of) liver, not elsewhere classified: Secondary | ICD-10-CM | POA: Diagnosis not present

## 2021-07-20 DIAGNOSIS — R7989 Other specified abnormal findings of blood chemistry: Secondary | ICD-10-CM | POA: Diagnosis not present

## 2021-07-20 DIAGNOSIS — C7801 Secondary malignant neoplasm of right lung: Secondary | ICD-10-CM | POA: Insufficient documentation

## 2021-07-20 DIAGNOSIS — M791 Myalgia, unspecified site: Secondary | ICD-10-CM | POA: Insufficient documentation

## 2021-07-20 DIAGNOSIS — C7802 Secondary malignant neoplasm of left lung: Secondary | ICD-10-CM | POA: Insufficient documentation

## 2021-07-20 DIAGNOSIS — R42 Dizziness and giddiness: Secondary | ICD-10-CM | POA: Insufficient documentation

## 2021-07-20 DIAGNOSIS — R0602 Shortness of breath: Secondary | ICD-10-CM | POA: Insufficient documentation

## 2021-07-20 DIAGNOSIS — M2550 Pain in unspecified joint: Secondary | ICD-10-CM | POA: Diagnosis not present

## 2021-07-20 DIAGNOSIS — C50911 Malignant neoplasm of unspecified site of right female breast: Secondary | ICD-10-CM

## 2021-07-20 LAB — CBC WITH DIFFERENTIAL (CANCER CENTER ONLY)
Abs Immature Granulocytes: 0.01 K/uL (ref 0.00–0.07)
Basophils Absolute: 0 K/uL (ref 0.0–0.1)
Basophils Relative: 1 %
Eosinophils Absolute: 0 K/uL (ref 0.0–0.5)
Eosinophils Relative: 1 %
HCT: 35.3 % — ABNORMAL LOW (ref 36.0–46.0)
Hemoglobin: 11.6 g/dL — ABNORMAL LOW (ref 12.0–15.0)
Immature Granulocytes: 0 %
Lymphocytes Relative: 56 %
Lymphs Abs: 2.4 K/uL (ref 0.7–4.0)
MCH: 34.2 pg — ABNORMAL HIGH (ref 26.0–34.0)
MCHC: 32.9 g/dL (ref 30.0–36.0)
MCV: 104.1 fL — ABNORMAL HIGH (ref 80.0–100.0)
Monocytes Absolute: 0.2 K/uL (ref 0.1–1.0)
Monocytes Relative: 4 %
Neutro Abs: 1.6 K/uL — ABNORMAL LOW (ref 1.7–7.7)
Neutrophils Relative %: 38 %
Platelet Count: 217 K/uL (ref 150–400)
RBC: 3.39 MIL/uL — ABNORMAL LOW (ref 3.87–5.11)
RDW: 14.9 % (ref 11.5–15.5)
WBC Count: 4.3 K/uL (ref 4.0–10.5)
nRBC: 0 % (ref 0.0–0.2)

## 2021-07-20 LAB — CMP (CANCER CENTER ONLY)
ALT: 56 U/L — ABNORMAL HIGH (ref 0–44)
AST: 61 U/L — ABNORMAL HIGH (ref 15–41)
Albumin: 3.5 g/dL (ref 3.5–5.0)
Alkaline Phosphatase: 182 U/L — ABNORMAL HIGH (ref 38–126)
Anion gap: 7 (ref 5–15)
BUN: 9 mg/dL (ref 6–20)
CO2: 28 mmol/L (ref 22–32)
Calcium: 9.1 mg/dL (ref 8.9–10.3)
Chloride: 102 mmol/L (ref 98–111)
Creatinine: 1.48 mg/dL — ABNORMAL HIGH (ref 0.44–1.00)
GFR, Estimated: 41 mL/min — ABNORMAL LOW
Glucose, Bld: 189 mg/dL — ABNORMAL HIGH (ref 70–99)
Potassium: 3.8 mmol/L (ref 3.5–5.1)
Sodium: 137 mmol/L (ref 135–145)
Total Bilirubin: 0.4 mg/dL (ref 0.3–1.2)
Total Protein: 7 g/dL (ref 6.5–8.1)

## 2021-07-20 MED ORDER — DIPHENHYDRAMINE HCL 25 MG PO CAPS
25.0000 mg | ORAL_CAPSULE | Freq: Once | ORAL | Status: AC
  Administered 2021-07-20: 25 mg via ORAL
  Filled 2021-07-20: qty 1

## 2021-07-20 MED ORDER — FAMOTIDINE 20 MG PO TABS
20.0000 mg | ORAL_TABLET | Freq: Once | ORAL | Status: AC
  Administered 2021-07-20: 20 mg via ORAL
  Filled 2021-07-20: qty 1

## 2021-07-20 MED ORDER — IBUPROFEN 200 MG PO TABS
400.0000 mg | ORAL_TABLET | Freq: Once | ORAL | Status: AC
  Administered 2021-07-20: 400 mg via ORAL
  Filled 2021-07-20: qty 2

## 2021-07-20 MED ORDER — FULVESTRANT 250 MG/5ML IM SOSY
500.0000 mg | PREFILLED_SYRINGE | Freq: Once | INTRAMUSCULAR | Status: AC
  Administered 2021-07-20: 500 mg via INTRAMUSCULAR
  Filled 2021-07-20: qty 10

## 2021-07-20 NOTE — Progress Notes (Signed)
This patient has had a reaction to the faslodex injection and premeds were ordered by her MD.

## 2021-07-20 NOTE — Progress Notes (Signed)
HEMATOLOGY/ONCOLOGY CLINIC NOTE  Date of Service: .07/20/2021   Patient Care Team: Ngetich, Nelda Bucks, NP as PCP - General (Family Medicine) Berniece Salines, DO as PCP - Cardiology (Cardiology) Everardo Beals, NP as Nurse Practitioner [facility]  CHIEF COMPLAINTS/PURPOSE OF CONSULTATION:   Follow-up for continued evaluation and management of metastatic breast cancer   HISTORY OF PRESENTING ILLNESS:  Brandy [name] is a wonderful 56 y.o. female who has been referred to [place] by Ngetich, Nelda Bucks, NP for evaluation and management of her suspected breast cancer.   The pt reports that she was having increasing low back pain on her right side that would not resolve, so she went to the ED. This pain has not resolved. She notes pain in her hips and knees.   Of note prior to the patient's visit today, pt has had an abdominal limited right upper ultrasound completed on 02/18/2019 with results revealing "Probable hepatic steatosis. No other abnormality seen in the right upper quadrant of the abdomen."  Pt had abdomen and pelvis CT completed on 02/18/2019 with results revealing "5cm spiculated soft tissue mass in inferior right breast, highly suspicious for primary breast carcinoma. No acute findings or metastatic disease within the abdomen or pelvis. Multiple small pulmonary nodules in both lung bases, consistent with pulmonary metastases. 4.5 cm uterine fibroid."  Most recent lab results (02/18/2019) of CBC is as follows: all values are WNL except for glucose at 121, calcium at 8.7, AST at 49, and ALT at 53.  On review of systems, pt reports pedal edema and denies chest pain, shortness of breath, weight loss, and any other symptoms.  Her last known mammography was on 06/18/2012.   On Social Hx the pt reports that she is a smoker and she smokes about half a pack per day. She does not drink alcohol. She has three children: age 44, 72, and 45.  On Family Hx the pt reports lung cancer.  Maternal Cousin passed from stomach cancer at the age of 43.    INTERVAL HISTORY:  Brandy [name] is a 56 y.o. female here for follow-up of her metastatic breast cancer. She reports orthostatic changes after visit with PCP BP 88/47. She says she is able to feel when BP drops. Pt reports dehydration and dry mouth. She has a previous diagnosis of anemia. She only increases salt intake when experiencing a migraine. She has been experiencing increased stress due to home and family life. She intends to move out of her current home. Pt started Mg at the recommendation of current PCP recently. She denies diarrhea, vertigo, SOB, or any other acute symptoms. She reports a passing feeling of brachycardia that occurs periodically. Pt recently had an episode of sciatica due to overextension of arm.   Her diet consists primarily of fruit and soy based proteins. She expresses excitement over her recent weight loss progress.   She is tolerating the 100 mg verzenio.  P.o. twice daily No issues with tolerating her Faslodex.  MEDICAL HISTORY:  Past Medical History:  Diagnosis Date   Acute pansinusitis 08/02/2017   Anxiety    Arthritis    ASD (atrial septal defect)    s/p closure with Amplatzer device 10/05/04 (Dr. Myriam Jacobson, [place]) 10/05/04   Cancer ([place])    Cataract    Dyspnea    GERD (gastroesophageal reflux disease)    Heart murmur    no longer heard   History of hiatal hernia    Legally blind in right  eye, as defined in [place]    Lumbar herniated disc    PONV (postoperative nausea and vomiting)    Sciatica   Uterine Fibroids  SURGICAL HISTORY: Past Surgical History:  Procedure Laterality Date   ABLATION     BREAST LUMPECTOMY WITH RADIOACTIVE SEED AND SENTINEL LYMPH NODE BIOPSY Right 11/23/2020   Procedure: RIGHT BREAST LUMPECTOMY WITH RADIOACTIVE SEED AND RIGHT AXILLARY SENTINEL LYMPH NODE BIOPSY;  Surgeon: Rolm Bookbinder, MD;  Location: [place];  Service: General;  Laterality: Right;    BREAST SURGERY Bilateral 2011   Breast Reduction Surgery   BUNIONECTOMY     CARDIAC CATHETERIZATION     10/05/04 [facility]): LM < 25%, otherwise normal coronaries. No pulmonary HTN, Mildly enlarged RV. Secundum ASD s/p closure.   CARDIAC SURGERY     CATARACT EXTRACTION     CLEFT PALATE REPAIR     s/p cleft lip and palate repair   EYE SURGERY Right 2019   right eye removed   LUMBAR LAMINECTOMY/DECOMPRESSION MICRODISCECTOMY Left 05/23/2016   Procedure: LEFT L4-L5 LATERAL RECESS DECOMPRESSION WITH CENTRAL AND RIGHT DECOMPRESSION VIA LEFT SIDE;  Surgeon: [name], MD;  Location: [place];  Service: Orthopedics;  Laterality: Left;   LUMBAR LAMINECTOMY/DECOMPRESSION MICRODISCECTOMY Left 05/23/2016   Procedure: LUMBAR LAMINECTOMY/DECOMPRESSION MICRODISCECTOMY Lumbar five - Sacral One 1 LEVEL;  Surgeon: [name], MD;  Location: [place];  Service: Orthopedics;  Laterality: Left;   REDUCTION MAMMAPLASTY  2011   SHOULDER INJECTION Left 05/23/2016   Procedure: SHOULDER INJECTION;  Surgeon: [name], MD;  Location: [place];  Service: Orthopedics;  Laterality: Left;  band-aid per pa-c   TRANSTHORACIC ECHOCARDIOGRAM     12/15/05 [facility]): Mild LVH, EF > 70%, grade 1 diastolic dysfunction, Trivial MR/PR/TR.   TUBAL LIGATION    Endometrial ablation 2003 Breast Reduction, bilateral 2011  SOCIAL HISTORY: Social History   Socioeconomic History   Marital status: Married    Spouse name: Not on file   Number of children: 3   Years of education: Not on file   Highest education level: Not on file  Occupational History   Not on file  Tobacco Use   Smoking status: Former    Packs/day: 0.25    Years: 25.00    Pack years: 6.25    Types: Cigarettes    Quit date: 02/04/2018    Years since quitting: 3.4   Smokeless tobacco: Never  Vaping Use   Vaping Use: Never used  Substance and Sexual Activity   Alcohol use: No   Drug use: No   Sexual activity: Not on file  Other Topics Concern   Not on file   Social History Narrative   Tobacco use, amount per day now: None.   Past tobacco use, amount per day: 1/4   How many years did you use tobacco: Intermittent x 20 years   Alcohol use (drinks per week): N/A   Diet: Plant Base   Do you drink/eat things with caffeine: Coffee, Tea, Soda.   Marital status:    Married                              What year were you married? 1999   Do you live in a house, apartment, assisted living, condo, trailer, etc.? House    Is it one or more stories? 2   How many persons live in your home? 6 adults, 5 children.   Do you have  pets in your home?( please list) 1 Terrier   Highest Level of education completed? AAS   Current or past profession: LPN   Do you exercise?   A little                               Type and how often? Walk, stretches.    Do you have a living will? No   Do you have a DNR form?   No                                If not, do you want to discuss one?   Do you have signed POA/HPOA forms? No                       If so, please bring to you appointment      Do you have any difficulty bathing or dressing yourself? No   Do you have any difficulty preparing food or eating? No   Do you have any difficulty managing your medications? No   Do you have any difficulty managing your finances? No   Do you have any difficulty affording your medications? No.   Social Determinants of Health   Financial Resource Strain: Not on file  Food Insecurity: Not on file  Transportation Needs: Not on file  Physical Activity: Not on file  Stress: Not on file  Social Connections: Not on file  Intimate Partner Violence: Not on file    FAMILY HISTORY: Family History  Problem Relation Age of Onset   Arthritis Mother    Hypertension Mother    Diabetes Mother    High blood pressure Mother    Cancer Father        Lung   Colon cancer Neg Hx    Colon polyps Neg Hx    Esophageal cancer Neg Hx    Rectal cancer Neg Hx    Stomach cancer Neg Hx      ALLERGIES:  is allergic to zithromax [azithromycin], other, and psyllium.  MEDICATIONS:  Current Outpatient Medications  Medication Sig Dispense Refill   acetaminophen (TYLENOL) 500 MG tablet Take 500-1,000 mg by mouth every 6 (six) hours as needed for moderate pain or headache.     albuterol (PROVENTIL) (2.5 MG/3ML) 0.083% nebulizer solution Take 3 mLs (2.5 mg total) by nebulization every 6 (six) hours as needed for wheezing or shortness of breath. 180 mL 1   ALPRAZolam (XANAX) 1 MG tablet Take 1 tablet (1 mg total) by mouth at bedtime. 30 tablet 5   amitriptyline (ELAVIL) 75 MG tablet Take 1 tablet (75 mg total) by mouth at bedtime. 90 tablet 1   carbamide peroxide (DEBROX) 6.5 % OTIC solution Place 5 drops into the right ear 2 (two) times daily. 15 mL 0   Carboxymethylcellul-Glycerin (LUBRICATING EYE DROPS OP) Place 1 drop into the left eye 4 (four) times daily.     Cyanocobalamin (B-12 PO) Take 1 tablet by mouth daily.     diphenoxylate-atropine (LOMOTIL) 2.5-0.025 MG tablet Take 1 tablet by mouth 3 (three) times daily as needed for diarrhea or loose stools. 30 tablet 0   fluticasone-salmeterol (ADVAIR) 250-50 MCG/ACT AEPB Inhale 1 puff into the lungs every 12 (twelve) hours. 60 each 5   HYDROcodone-acetaminophen (NORCO/VICODIN) 5-325 MG tablet Take 1 tablet by mouth every 6 (six) hours as needed.  10 tablet 0   loperamide (IMODIUM) 2 MG capsule Take 1-2 capsules (2-4 mg total) by mouth 4 (four) times daily as needed for diarrhea or loose stools. 30 capsule 1   magnesium oxide (MAG-OX) 400 MG tablet Take 1 tablet (400 mg total) by mouth 2 (two) times daily. 14 tablet 0   metoprolol tartrate (LOPRESSOR) 100 MG tablet Take 2 hours prior to CT 1 tablet 0   Multiple Vitamin (MULTIVITAMIN WITH MINERALS) TABS tablet Take 1 tablet by mouth daily.     omeprazole (PRILOSEC OTC) 20 MG tablet Take 20 mg by mouth daily.     ondansetron (ZOFRAN) 8 MG tablet TAKE 1 TABLET(8 MG) BY MOUTH EVERY 8 HOURS  AS NEEDED FOR NAUSEA OR VOMITING 30 tablet 3   predniSONE (DELTASONE) 20 MG tablet Take 2 tablets (40 mg total) by mouth daily with breakfast for 1 day, THEN 1.5 tablets (30 mg total) daily with breakfast for 1 day, THEN 1 tablet (20 mg total) daily with breakfast for 1 day, THEN 0.5 tablets (10 mg total) daily with breakfast for 1 day. 5 tablet 0   rosuvastatin (CRESTOR) 10 MG tablet Take 1 tablet (10 mg total) by mouth daily. 90 tablet 3   VERZENIO 100 MG tablet TAKE 1 TABLET BY MOUTH TWICE DAILY, DO NOT CHEW,CRUSH, OR SPLIT TABLETS BEFORE SWALLOWING 56 tablet 0   Vitamin D, Ergocalciferol, (DRISDOL) 1.25 MG (50000 UNIT) CAPS capsule TAKE 1 CAPSULE ONCE PER WEEK 12 capsule 3   White Petrolatum-Mineral Oil (LUBRICANT EYE NIGHTTIME) OINT Place 1 drop into the left eye at bedtime.     No current facility-administered medications for this visit.    REVIEW OF SYSTEMS:    (+) Falls (+) Myalgias  (+) Arthralgias (+) Tremors (+) Shortness of breath (+) Dizziness  .10 Point review of Systems was done is negative except as noted above.   PHYSICAL EXAMINATION: ECOG FS:1 - Symptomatic but completely ambulatory  There were no vitals filed for this visit.   Wt Readings from Last 3 Encounters:  07/14/21 214 lb (97.1 kg)  07/12/21 214 lb (97.1 kg)  06/21/21 214 lb 9.6 oz (97.3 kg)   There is no height or weight on file to calculate BMI.   Marland Kitchen GENERAL:alert, in no acute distress and comfortable SKIN: no acute rashes, no significant lesions EYES: conjunctiva are pink and non-injected, sclera anicteric, right prosthetic eye. OROPHARYNX: MMM, no exudates, no oropharyngeal erythema or ulceration NECK: supple, no JVD LYMPH:  no palpable lymphadenopathy in the cervical, axillary or inguinal regions LUNGS: clear to auscultation b/l with normal respiratory effort HEART: regular rate & rhythm ABDOMEN:  normoactive bowel sounds , non tender, not distended. Extremity: no pedal edema PSYCH: alert &  oriented x 3 with fluent speech NEURO: no focal motor/sensory deficits  LABORATORY DATA:  I have reviewed the data as listed . CBC Latest Ref Rng & Units 07/12/2021 06/01/2021 05/03/2021  WBC 4.0 - 10.5 K/uL 5.5 3.9(L) 3.3(L)  Hemoglobin 12.0 - 15.0 g/dL 11.3(L) 11.4(L) 11.3(L)  Hematocrit 36.0 - 46.0 % 33.7(L) 34.2(L) 33.1(L)  Platelets 150 - 400 K/uL 232 240 223     CMP Latest Ref Rng & Units 07/12/2021 06/14/2021 06/01/2021  Glucose 70 - 99 mg/dL 85 88 116(H)  BUN 6 - 20 mg/dL _0 Creatinine 0.44 - 1.00 mg/dL 1.10(H) 1.48(H) 1.17(H)  Sodium 135 - 145 mmol/L 139 143 138  Potassium 3.5 - 5.1 mmol/L 3.4(L) 4.7 3.9  Chloride 98 - 111 mmol/L  103 102 104  CO2 22 - 32 mmol/L _0 Calcium 8.9 - 10.3 mg/dL 8.9 9.6 8.9  Total Protein 6.5 - 8.1 g/dL 7.5 - 7.2  Total Bilirubin 0.3 - 1.2 mg/dL 0.7 - 0.5  Alkaline Phos 38 - 126 U/L 173(H) - 158(H)  AST 15 - 41 U/L 66(H) - 52(H)  ALT 0 - 44 U/L 74(H) - 67(H)      Component     Latest Ref Rng & Units 03/08/2019 03/22/2019  FSH     mIU/mL 65.5   LH     mIU/mL 46.6   Estrogen     pg/mL 82   Progesterone     ng/mL <0.1   Estradiol     pg/mL  <5.0        RADIOGRAPHIC STUDIES: I have personally reviewed the radiological images as listed and agreed with the findings in the report. CT Angio Chest PE W and/or Wo Contrast  Result Date: 07/12/2021 CLINICAL DATA:  Abdominal pain radiating to the right lower back, breast cancer metastatic to lungs. EXAM: CT ANGIOGRAPHY CHEST WITH CONTRAST TECHNIQUE: Multidetector CT imaging of the chest was performed using the standard protocol during bolus administration of intravenous contrast. Multiplanar CT image reconstructions and MIPs were obtained to evaluate the vascular anatomy. RADIATION DOSE REDUCTION: This exam was performed according to the departmental dose-optimization program which includes automated exposure control, adjustment of the mA and/or kV according to patient size and/or use  of iterative reconstruction technique. CONTRAST:  77m OMNIPAQUE IOHEXOL 350 MG/ML SOLN COMPARISON:  05/21/2020 and PET 08/07/2020. FINDINGS: Cardiovascular: Negative for pulmonary embolus. Enlarged pulmonic trunk. Heart is at the upper limits of normal in size to mildly enlarged. No pericardial effusion. Mediastinum/Nodes: No pathologically enlarged mediastinal, hilar or axillary lymph nodes. Esophagus is unremarkable. Lungs/Pleura: Image quality is markedly degraded by expiratory phase imaging, creating mosaic attenuation in the lungs. Scattered pulmonary nodules appear grossly stable, measuring up to 8 x 12 mm in the apical segment right upper lobe (6/31). No pleural fluid. Airway is otherwise unremarkable. Upper Abdomen: Visualized portions of the liver gallbladder, adrenal glands, kidneys, spleen pancreas, stomach and bowel are grossly unremarkable. Musculoskeletal: Degenerative changes in the spine. No worrisome lytic or sclerotic lesions. Probable postoperative seroma in the right breast, measuring 2.8 x 7.7 cm. Review of the MIP images confirms the above findings. IMPRESSION: 1. Negative for pulmonary embolus. 2. Grossly stable pulmonary metastases, likely quiescent when compared with PET 08/07/2020. 3. Enlarged pulmonic trunk, indicative of pulmonary arterial hypertension. Electronically Signed   By: MLorin PicketM.D.   On: 07/12/2021 13:59   09/02/2020 Surgical Pathology     ASSESSMENT & PLAN:   EORMA CHEETHAMis a 56y.o. female with:  1. Metastatic breast cancer ER+/PRneg/Her2 neg  02/28/2019 neck CT with results revealing "negative for mass or adenopathy in the neck."  03/04/2019 head MRI with results revealing "Negative for metastatic disease.  No acute abnormality in the brain."  2. Pulmonary metastases  02/18/2019 chest and abdomen with results revealing "5cm spiculated soft tissue mass in inferior right breast, highly suspicious for primary breast carcinoma. No acute  findings or metastatic disease within the abdomen or pelvis. Multiple small pulmonary nodules in both lung bases, consistent with pulmonary metastases. 4.5 cm uterine fibroid."  02/28/2019 C/A/P CT with results revealing "Irregular solid 5.0 cm right breast mass, suspicious for primary right breast malignancy. Innumerable solid pulmonary nodules scattered throughout both lungs, compatible with pulmonary metastases. No evidence of  metastatic disease in the abdomen, pelvis or skeleton. Mildly enlarged and probably myomatous uterus. Simple 1.4 cm left adnexal cyst requires no follow-up. This recommendation follows ACR consensus guidelines: White Paper of the ACR Incidental Findings Committee II on Adnexal Findings. J Am Coll Radiol [phone]. Aortic Atherosclerosis (ICD10-I70.0)."  NUCLEAR MEDICINE WHOLE BODY BONE SCAN completed on 03/19/2019 with results revealing "1. No scintigraphic evidence skeletal metastasis. 2. Degenerative bone disease in the posterior elements of the upper and mid lumbar spine."   04/09/2019 Bone Density ([id number]) which revealed "The BMD measured at Femur Neck Right is 1.153 g/cm2 with a T-score of 0.8. This patient is considered normal according to [facility]) criteria. Lumbar spine was not utilized due to advanced degenerative changes. The scan quality is good. Femur Neck Right 04/09/2019 54.7 Normal 0.8 1.153 g/cm2. Left Forearm Radius 33% 04/09/2019 54.7 Normal 0.8 0.949 g/cm2."  08/04/2019 CT Angio Chest ([id number]) revealed "1. No lobar or central pulmonary embolus detected. Exam is limited secondary to respiratory motion. 2. Mild ground-glass attenuation may represent mild pneumonitis or areas of air trapping. 3. Signs of atrial septal closure. 4. Decrease in size and number of bilateral pulmonary nodules, marked response noted on today's exam with the only nodule remaining near a cm in the right upper lobe and the smaller nodules that were present  on the previous examination throughout the chest no longer measurable though the lower lobes are limited by respiratory motion. 5. Decreased size of right breast mass. 6. Probable hepatic steatosis."  S/p rt breast lumpectomy on 6/20 -- no residual carcinoma on pathology.  3. Abnormal Liver function test -- previous? Fatty liver-fluctuating liver function tests- stable currently. 07/03/2019 [place] Abd ([id number]) revealed "1. No acute findings. Normal gallbladder. No bile duct dilation. 2. Significant increased liver parenchymal echogenicity consistent with extensive hepatic steatosis."  PLAN: -Patient's labs from today were reviewed in detail CBC stable with hemoglobin of 11.6 normal WBC count and platelets CMP with creatinine of 1.48 elevated chronically high AST and ALT stable -No new toxicities from her current treatment with Faslodex and Verzenio -Continue Faslodex at 500 mg every 4 weeks -Continue Verzenio 100 mg p.o. twice daily -Continue Vitamin D. -No lab or clinical evidence of breast cancer progression at this time  FOLLOW UP: Please cancel appointments on 07/26/2021 -Return to clinic as scheduled with labs and next dose of Faslodex in 1 month -Return to clinic with labs, Faslodex and next MD visit in 2 months   All of the patient's questions were answered with apparent satisfaction. The patient knows to call the clinic with any problems, questions or concerns.   Sullivan Lone MD [place] AAHIVMS [facility] Hematology/Oncology Physician [facility]

## 2021-07-21 ENCOUNTER — Telehealth: Payer: Self-pay | Admitting: Hematology

## 2021-07-21 NOTE — Telephone Encounter (Signed)
Rescheduled upcoming appointments per 2/14 los. Patient is aware of changes.

## 2021-07-26 ENCOUNTER — Encounter: Payer: Self-pay | Admitting: Hematology

## 2021-07-26 ENCOUNTER — Other Ambulatory Visit: Payer: Self-pay

## 2021-07-26 ENCOUNTER — Ambulatory Visit: Payer: Medicare Other

## 2021-07-26 ENCOUNTER — Other Ambulatory Visit: Payer: Medicare Other

## 2021-07-26 DIAGNOSIS — C50919 Malignant neoplasm of unspecified site of unspecified female breast: Secondary | ICD-10-CM

## 2021-07-26 MED ORDER — ABEMACICLIB 100 MG PO TABS: ORAL_TABLET | ORAL | 0 refills | Status: DC

## 2021-07-27 ENCOUNTER — Encounter: Payer: Self-pay | Admitting: Cardiology

## 2021-07-27 NOTE — Telephone Encounter (Signed)
Pt aware that Dr. Harriet Masson is requesting an appointment. Appointment has been made.

## 2021-07-28 ENCOUNTER — Ambulatory Visit: Payer: Medicare Other | Admitting: Cardiology

## 2021-07-28 ENCOUNTER — Other Ambulatory Visit: Payer: Self-pay

## 2021-07-28 ENCOUNTER — Encounter: Payer: Self-pay | Admitting: Cardiology

## 2021-07-28 VITALS — Ht 67.0 in | Wt 208.0 lb

## 2021-07-28 DIAGNOSIS — Q2111 Secundum atrial septal defect: Secondary | ICD-10-CM | POA: Diagnosis not present

## 2021-07-28 DIAGNOSIS — Z79899 Other long term (current) drug therapy: Secondary | ICD-10-CM

## 2021-07-28 DIAGNOSIS — I951 Orthostatic hypotension: Secondary | ICD-10-CM

## 2021-07-28 DIAGNOSIS — N1831 Chronic kidney disease, stage 3a: Secondary | ICD-10-CM | POA: Diagnosis not present

## 2021-07-28 LAB — CBC WITH DIFFERENTIAL/PLATELET
Basophils Absolute: 0 10*3/uL (ref 0.0–0.2)
Basos: 1 %
EOS (ABSOLUTE): 0.1 10*3/uL (ref 0.0–0.4)
Eos: 1 %
Hematocrit: 37.1 % (ref 34.0–46.6)
Hemoglobin: 12.7 g/dL (ref 11.1–15.9)
Immature Grans (Abs): 0 10*3/uL (ref 0.0–0.1)
Immature Granulocytes: 0 %
Lymphocytes Absolute: 3.2 10*3/uL — ABNORMAL HIGH (ref 0.7–3.1)
Lymphs: 63 %
MCH: 34.4 pg — ABNORMAL HIGH (ref 26.6–33.0)
MCHC: 34.2 g/dL (ref 31.5–35.7)
MCV: 101 fL — ABNORMAL HIGH (ref 79–97)
Monocytes Absolute: 0.3 10*3/uL (ref 0.1–0.9)
Monocytes: 6 %
Neutrophils Absolute: 1.4 10*3/uL (ref 1.4–7.0)
Neutrophils: 29 %
Platelets: 253 10*3/uL (ref 150–450)
RBC: 3.69 x10E6/uL — ABNORMAL LOW (ref 3.77–5.28)
RDW: 13.5 % (ref 11.7–15.4)
WBC: 5 10*3/uL (ref 3.4–10.8)

## 2021-07-28 LAB — BASIC METABOLIC PANEL
BUN/Creatinine Ratio: 12 (ref 9–23)
BUN: 16 mg/dL (ref 6–24)
CO2: 30 mmol/L — ABNORMAL HIGH (ref 20–29)
Calcium: 9.5 mg/dL (ref 8.7–10.2)
Chloride: 99 mmol/L (ref 96–106)
Creatinine, Ser: 1.38 mg/dL — ABNORMAL HIGH (ref 0.57–1.00)
Glucose: 103 mg/dL — ABNORMAL HIGH (ref 70–99)
Potassium: 4.3 mmol/L (ref 3.5–5.2)
Sodium: 142 mmol/L (ref 134–144)
eGFR: 45 mL/min/{1.73_m2} — ABNORMAL LOW (ref >59)

## 2021-07-28 MED ORDER — MIDODRINE HCL 5 MG PO TABS
5.0000 mg | ORAL_TABLET | Freq: Three times a day (TID) | ORAL | 3 refills | Status: DC
Start: 2021-07-28 — End: 2021-11-04

## 2021-07-28 NOTE — Progress Notes (Signed)
Cardiology Office Note:    Date:  07/28/2021   ID:  LEAUNA SHARBER, DOB [birth year], MRN [id number]  PCP:  Sandrea Hughs, NP  Cardiologist:  Berniece Salines, DO  Electrophysiologist:  None   Referring MD: Sandrea Hughs, NP   " I am having lightheadedness"    History of Present Illness:    Brandy [name] is a 56 y.o. female with a hx of secundum ASD s/p repair in 2006, prior catheterization in 2013 with did not show any evidence of coronary artery disease, anxiety, lung cancer status post surgery is here today for follow-up visit.  I first saw the patient on June 03, 2021 at that time she was experiencing significant shortness of breath and chest discomfort.  Given her history I recommend the patient undergo a coronary CTA along with an echocardiogram.  All of her testing did not show any evidence of coronary artery disease or any significant structural abnormality.    Past Medical History:  Diagnosis Date   Acute pansinusitis 08/02/2017   Anxiety    Arthritis    ASD (atrial septal defect)    s/p closure with Amplatzer device 10/05/04 (Dr. Myriam Jacobson, [place]) 10/05/04   Cancer ([place])    Cataract    Dyspnea    GERD (gastroesophageal reflux disease)    Heart murmur    no longer heard   History of hiatal hernia    Legally blind in right eye, as defined in [place]    Lumbar herniated disc    PONV (postoperative nausea and vomiting)    Sciatica     Past Surgical History:  Procedure Laterality Date   ABLATION     BREAST LUMPECTOMY WITH RADIOACTIVE SEED AND SENTINEL LYMPH NODE BIOPSY Right 11/23/2020   Procedure: RIGHT BREAST LUMPECTOMY WITH RADIOACTIVE SEED AND RIGHT AXILLARY SENTINEL LYMPH NODE BIOPSY;  Surgeon: Rolm Bookbinder, MD;  Location: [facility];  Service: General;  Laterality: Right;   BREAST SURGERY Bilateral 2011   Breast Reduction Surgery   BUNIONECTOMY     CARDIAC CATHETERIZATION     10/05/04 [facility]): LM < 25%, otherwise normal coronaries. No  pulmonary HTN, Mildly enlarged RV. Secundum ASD s/p closure.   CARDIAC SURGERY     CATARACT EXTRACTION     CLEFT PALATE REPAIR     s/p cleft lip and palate repair   EYE SURGERY Right 2019   right eye removed   LUMBAR LAMINECTOMY/DECOMPRESSION MICRODISCECTOMY Left 05/23/2016   Procedure: LEFT L4-L5 LATERAL RECESS DECOMPRESSION WITH CENTRAL AND RIGHT DECOMPRESSION VIA LEFT SIDE;  Surgeon: [name], MD;  Location: [facility];  Service: Orthopedics;  Laterality: Left;   LUMBAR LAMINECTOMY/DECOMPRESSION MICRODISCECTOMY Left 05/23/2016   Procedure: LUMBAR LAMINECTOMY/DECOMPRESSION MICRODISCECTOMY Lumbar five - Sacral One 1 LEVEL;  Surgeon: [name], MD;  Location: [place];  Service: Orthopedics;  Laterality: Left;   REDUCTION MAMMAPLASTY  2011   SHOULDER INJECTION Left 05/23/2016   Procedure: SHOULDER INJECTION;  Surgeon: [name], MD;  Location: [place];  Service: Orthopedics;  Laterality: Left;  band-aid per pa-c   TRANSTHORACIC ECHOCARDIOGRAM     12/15/05 [facility]): Mild LVH, EF > 34%, grade 1 diastolic dysfunction, Trivial MR/PR/TR.   TUBAL LIGATION      Current Medications: Current Meds  Medication Sig   abemaciclib (VERZENIO) 100 MG tablet TAKE 1 TABLET BY MOUTH TWICE DAILY, DO NOT CHEW,CRUSH, OR SPLIT TABLETS BEFORE SWALLOWING   acetaminophen (TYLENOL) 500 MG tablet Take 500-1,000 mg by mouth every 6 (  six) hours as needed for moderate pain or headache.   albuterol (PROVENTIL) (2.5 MG/3ML) 0.083% nebulizer solution Take 3 mLs (2.5 mg total) by nebulization every 6 (six) hours as needed for wheezing or shortness of breath.   ALPRAZolam (XANAX) 1 MG tablet Take 1 tablet (1 mg total) by mouth at bedtime.   amitriptyline (ELAVIL) 75 MG tablet Take 1 tablet (75 mg total) by mouth at bedtime.   carbamide peroxide (DEBROX) 6.5 % OTIC solution Place 5 drops into the right ear 2 (two) times daily.   Carboxymethylcellul-Glycerin (LUBRICATING EYE DROPS OP) Place 1 drop into the left eye 4 (four)  times daily.   Cyanocobalamin (B-12 PO) Take 1 tablet by mouth daily.   diphenoxylate-atropine (LOMOTIL) 2.5-0.025 MG tablet Take 1 tablet by mouth 3 (three) times daily as needed for diarrhea or loose stools.   fluticasone-salmeterol (ADVAIR) 250-50 MCG/ACT AEPB Inhale 1 puff into the lungs every 12 (twelve) hours.   HYDROcodone-acetaminophen (NORCO/VICODIN) 5-325 MG tablet Take 1 tablet by mouth every 6 (six) hours as needed.   loperamide (IMODIUM) 2 MG capsule Take 1-2 capsules (2-4 mg total) by mouth 4 (four) times daily as needed for diarrhea or loose stools.   magnesium oxide (MAG-OX) 400 MG tablet Take 1 tablet (400 mg total) by mouth 2 (two) times daily.   midodrine (PROAMATINE) 5 MG tablet Take 1 tablet (5 mg total) by mouth every 8 (eight) hours.   Multiple Vitamin (MULTIVITAMIN WITH MINERALS) TABS tablet Take 1 tablet by mouth daily.   omeprazole (PRILOSEC OTC) 20 MG tablet Take 20 mg by mouth daily.   ondansetron (ZOFRAN) 8 MG tablet TAKE 1 TABLET(8 MG) BY MOUTH EVERY 8 HOURS AS NEEDED FOR NAUSEA OR VOMITING   rosuvastatin (CRESTOR) 10 MG tablet Take 1 tablet (10 mg total) by mouth daily.   Vitamin D, Ergocalciferol, (DRISDOL) 1.25 MG (50000 UNIT) CAPS capsule TAKE 1 CAPSULE ONCE PER WEEK   White Petrolatum-Mineral Oil (LUBRICANT EYE NIGHTTIME) OINT Place 1 drop into the left eye at bedtime.     Allergies:   Zithromax [azithromycin], Other, and Psyllium   Social History   Socioeconomic History   Marital status: Married    Spouse name: Not on file   Number of children: 3   Years of education: Not on file   Highest education level: Not on file  Occupational History   Not on file  Tobacco Use   Smoking status: Former    Packs/day: 0.25    Years: 25.00    Pack years: 6.25    Types: Cigarettes    Quit date: 02/04/2018    Years since quitting: 3.4   Smokeless tobacco: Never  Vaping Use   Vaping Use: Never used  Substance and Sexual Activity   Alcohol use: No   Drug use:  No   Sexual activity: Not on file  Other Topics Concern   Not on file  Social History Narrative   Tobacco use, amount per day now: None.   Past tobacco use, amount per day: 1/4   How many years did you use tobacco: Intermittent x 20 years   Alcohol use (drinks per week): N/A   Diet: [place]   Do you drink/eat things with caffeine: Coffee, Tea, Soda.   Marital status:    Married                              What year were you married?  1999   Do you live in a house, apartment, assisted living, condo, trailer, etc.? House    Is it one or more stories? 2   How many persons live in your home? 6 adults, 5 children.   Do you have pets in your home?( please list) 1 Terrier   Highest Level of education completed? AAS   Current or past profession: LPN   Do you exercise?   A little                               Type and how often? Walk, stretches.    Do you have a living will? No   Do you have a DNR form?   No                                If not, do you want to discuss one?   Do you have signed POA/HPOA forms? No                       If so, please bring to you appointment      Do you have any difficulty bathing or dressing yourself? No   Do you have any difficulty preparing food or eating? No   Do you have any difficulty managing your medications? No   Do you have any difficulty managing your finances? No   Do you have any difficulty affording your medications? No.   Social Determinants of Health   Financial Resource Strain: Not on file  Food Insecurity: Not on file  Transportation Needs: Not on file  Physical Activity: Not on file  Stress: Not on file  Social Connections: Not on file     Family History: The patient's family history includes Arthritis in her mother; Cancer in her father; Diabetes in her mother; High blood pressure in her mother; Hypertension in her mother. There is no history of Colon cancer, Colon polyps, Esophageal cancer, Rectal cancer, or Stomach  cancer.  ROS:   Review of Systems  Constitution: Negative for decreased appetite, fever and weight gain.  HENT: Negative for congestion, ear discharge, hoarse voice and sore throat.   Eyes: Negative for discharge, redness, vision loss in right eye and visual halos.  Cardiovascular: Negative for chest pain, dyspnea on exertion, leg swelling, orthopnea and palpitations.  Respiratory: Negative for cough, hemoptysis, shortness of breath and snoring.   Endocrine: Negative for heat intolerance and polyphagia.  Hematologic/Lymphatic: Negative for bleeding problem. Does not bruise/bleed easily.  Skin: Negative for flushing, nail changes, rash and suspicious lesions.  Musculoskeletal: Negative for arthritis, joint pain, muscle cramps, myalgias, neck pain and stiffness.  Gastrointestinal: Negative for abdominal pain, bowel incontinence, diarrhea and excessive appetite.  Genitourinary: Negative for decreased libido, genital sores and incomplete emptying.  Neurological: Negative for brief paralysis, focal weakness, headaches and loss of balance.  Psychiatric/Behavioral: Negative for altered mental status, depression and suicidal ideas.  Allergic/Immunologic: Negative for HIV exposure and persistent infections.    EKGs/Labs/Other Studies Reviewed:    The following studies were reviewed today:   EKG: None today   TTE 06/03/2022 IMPRESSIONS     1. Left ventricular ejection fraction, by estimation, is 60 to 65%. The  left ventricle has normal function. The left ventricle has no regional  wall motion abnormalities. Left ventricular diastolic parameters were  normal.   2. Right  ventricular systolic function is normal. The right ventricular  size is normal.   3. The mitral valve is grossly normal. No evidence of mitral valve  regurgitation. No evidence of mitral stenosis.   4. The aortic valve is tricuspid. Aortic valve regurgitation is not  visualized. No aortic stenosis is present.    Conclusion(s)/Recommendation(s): Normal biventricular function without  evidence of hemodynamically significant valvular heart disease.   FINDINGS   Left Ventricle: Left ventricular ejection fraction, by estimation, is 60  to 65%. The left ventricle has normal function. The left ventricle has no  regional wall motion abnormalities. Definity contrast agent was given IV  to delineate the left ventricular   endocardial borders. The left ventricular internal cavity size was normal  in size. There is no left ventricular hypertrophy. Left ventricular  diastolic parameters were normal.   Right Ventricle: The right ventricular size is normal. No increase in  right ventricular wall thickness. Right ventricular systolic function is  normal.   Left Atrium: Left atrial size was normal in size.   Right Atrium: Right atrial size was normal in size.   Pericardium: Trivial pericardial effusion is present.   Mitral Valve: The mitral valve is grossly normal. No evidence of mitral  valve regurgitation. No evidence of mitral valve stenosis.   Tricuspid Valve: The tricuspid valve is grossly normal. Tricuspid valve  regurgitation is trivial. No evidence of tricuspid stenosis.   Aortic Valve: The aortic valve is tricuspid. Aortic valve regurgitation is  not visualized. No aortic stenosis is present.   Pulmonic Valve: The pulmonic valve was grossly normal. Pulmonic valve  regurgitation is trivial. No evidence of pulmonic stenosis.   Aorta: The aortic root and ascending aorta are structurally normal, with  no evidence of dilitation.   Venous: The right lower pulmonary vein is normal. The inferior vena cava  was not well visualized.   IAS/Shunts: The atrial septum is grossly normal.         CCTA 06/17/2021 FINDINGS: Image quality: Excellent.   Noise artifact is: Limited.   Coronary Arteries:  Normal coronary origin.  Left dominance.   Left main: The left main is a large caliber vessel  with a normal take off from the left coronary cusp that bifurcates to form a left anterior descending artery and a left circumflex artery. trifurcates into a LAD, LCX, and ramus intermedius. There is no plaque or stenosis.   Left anterior descending artery: The LAD is patent without evidence of plaque or stenosis. 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 The LAD gives off 3 patent diagonal branches. The LAD is large and wraps around the apex to supply the distal 1/3 of the posterior interventricular groove.   Left circumflex artery: The LCX is non-dominant and patent with no evidence of plaque or stenosis. The LCX gives off 2 patent obtuse marginal branches.   Right coronary artery: The RCA is non-dominant with normal take off from the right coronary cusp. There is no evidence of plaque or stenosis.   Right Atrium: Right atrial size is within normal limits. An interatrial septal closure device is present without residual leak.   Right Ventricle: The right ventricular cavity is within normal limits.   Left Atrium: Left atrial size is normal in size with no left atrial appendage filling defect.   Left Ventricle: The ventricular cavity size is within normal limits. There are no stigmata of prior infarction. There is no abnormal filling defect.   Pulmonary arteries: Normal in size without  proximal filling defect.   Pulmonary veins: Normal pulmonary venous drainage.   Pericardium: Normal thickness with no significant effusion or calcium present.   Cardiac valves: The aortic valve is trileaflet without significant calcification. The mitral valve is normal structure without significant calcification.   Aorta: Normal caliber with no significant disease.   Extra-cardiac findings: See attached radiology report for non-cardiac structures.   IMPRESSION: 1. Coronary calcium score of 0.   2. Normal coronary origin with left dominance.   3. Normal coronary arteries.    4. Mid LAD myocardial bridge (benign).   5. An interatrial septal closure device is present without residual leak.   RECOMMENDATIONS: 1. No evidence of CAD (0%). Consider non-atherosclerotic causes of chest pain.   Eleonore Chiquito, MD     Electronically Signed   By: Eleonore Chiquito M.D.   On: 06/17/2021 11:24    Addended by Geralynn Rile, MD on 06/17/2021 11:26 AM   Study Result  Narrative & Impression  EXAM: OVER-READ INTERPRETATION  CT CHEST   The following report is an over-read performed by radiologist Dr. Vinnie Langton of [facility], [place] on 06/17/2021. This over-read does not include interpretation of cardiac or coronary anatomy or pathology. The coronary calcium score/coronary CTA interpretation by the cardiologist is attached.   COMPARISON:  Chest CT 05/21/2020.   FINDINGS: Atherosclerotic calcifications in the thoracic aorta. Within the visualized portions of the thorax there are no suspicious appearing pulmonary nodules or masses, there is no acute consolidative airspace disease, no pleural effusions, no pneumothorax and no lymphadenopathy. Visualized portions of the upper abdomen are unremarkable. There are no aggressive appearing lytic or blastic lesions noted in the visualized portions of the skeleton.   IMPRESSION: 1.  Aortic Atherosclerosis (ICD10-I70.0).   Electronically Signed: By: Vinnie Langton M.D. On: 06/17/2021 09:02    Recent Labs: 05/28/2021: TSH 0.91 06/14/2021: Magnesium 1.5 07/20/2021: ALT 56; BUN 9; Creatinine 1.48; Hemoglobin 11.6; Platelet Count 217; Potassium 3.8; Sodium 137  Recent Lipid Panel    Component Value Date/Time   CHOL 291 (H) 05/28/2021 1115   TRIG 167 (H) 05/28/2021 1115   HDL 87 05/28/2021 1115   CHOLHDL 3.3 05/28/2021 1115   LDLCALC 172 (H) 05/28/2021 1115    Physical Exam:    VS:  Ht 5\' 7"  (1.702 m)    Wt 208 lb (94.3 kg)    SpO2 98%    BMI 32.58 kg/m     Wt Readings from Last 3  Encounters:  07/28/21 208 lb (94.3 kg)  07/20/21 209 lb (94.8 kg)  07/14/21 214 lb (97.1 kg)     GEN: Well nourished, well developed in no acute distress HEENT: Normal NECK: No JVD; No carotid bruits LYMPHATICS: No lymphadenopathy CARDIAC: S1S2 noted,RRR, no murmurs, rubs, gallops RESPIRATORY:  Clear to auscultation without rales, wheezing or rhonchi  ABDOMEN: Soft, non-tender, non-distended, +bowel sounds, no guarding. EXTREMITIES: No edema, No cyanosis, no clubbing MUSCULOSKELETAL:  No deformity  SKIN: Warm and dry NEUROLOGIC:  Alert and oriented x 3, non-focal PSYCHIATRIC:  Normal affect, good insight  ASSESSMENT:    1. Orthostatic hypotension   2. Secundum ASD   3. Stage 3a chronic kidney disease ([place])   4. Medication management    PLAN:    She also complains of some headache I referred the patient back to her I was able to do her orthostatic vitals manually lying 145/80 left arm heart rate of 96, sitting 122/72 mmHg heart rate 106 and standing 90/60 mmHg, heart  rate 118.  Orthostatic positive.  We will start the patient on midodrine 5 mg every 8 hours.  I encouraged compression socks as well as abdominal binder.  She admits to not taking enough fluid intake I encouraged her to do so.  We will also get blood work today for CBC, CMP, mg .  She is not on any antihypertensive medication. I refer the patient back to her primary neurologist for her headache.  I discussed her testing results with her her coronary CT scan as well as her echocardiogram.  ASD closure in place.  She continues to get her treatment for breast cancer.  The patient understands the need to lose weight with diet and exercise. We have discussed specific strategies for this.  Hyperlipidemia - continue with current statin medication.  The patient is in agreement with the above plan. The patient left the office in stable condition.  The patient will follow up in 4 weeks or sooner if needed.   Medication  Adjustments/Labs and Tests Ordered: Current medicines are reviewed at length with the patient today.  Concerns regarding medicines are outlined above.  Orders Placed This Encounter  Procedures   CBC with Differential/Platelet   Basic Metabolic Panel (BMET)   Meds ordered this encounter  Medications   midodrine (PROAMATINE) 5 MG tablet    Sig: Take 1 tablet (5 mg total) by mouth every 8 (eight) hours.    Dispense:  270 tablet    Refill:  3    Patient Instructions  Medication Instructions:  Your physician has recommended you make the following change in your medication:  START: Midodrine 5 mg every 8 hours *If you need a refill on your cardiac medications before your next appointment, please call your pharmacy*   Lab Work: Your physician recommends that you return for lab work in:  TODAY: BMP, CBC If you have labs (blood work) drawn today and your tests are completely normal, you will receive your results only by: [place] (if you have [place]) OR A paper copy in the mail If you have any lab test that is abnormal or we need to change your treatment, we will call you to review the results.   Testing/Procedures: None   Follow-Up: At [facility], you and your health needs are our priority.  As part of our continuing mission to provide you with exceptional heart care, we have created designated Provider Care Teams.  These Care Teams include your primary Cardiologist (physician) and Advanced Practice Providers (APPs -  Physician Assistants and Nurse Practitioners) who all work together to provide you with the care you need, when you need it.  We recommend signing up for the patient portal called "MyChart".  Sign up information is provided on this After Visit Summary.  MyChart is used to connect with patients for Virtual Visits (Telemedicine).  Patients are able to view lab/test results, encounter notes, upcoming appointments, etc.  Non-urgent messages can be sent to your  provider as well.   To learn more about what you can do with MyChart, go to [email or web address].    Your next appointment:   12 week(s)  The format for your next appointment:   In Person  Provider:   Berniece Salines, DO     Other Instructions  PLEASE PURCHASE AND WEAR COMPRESSION STOCKINGS DAILY AND TAKE OFF AT BEDTIME.  I recommend getting support socks/stockings. 10 mmHg is the preferred amount of compression.    Compression stockings are elastic socks that squeeze the legs. They  help to increase blood flow to the legs and to decrease swelling in the legs from fluid retention, and reduce the chance of developing blood clots in the lower legs. Please put on in the AM when dressing and off at night when dressing for bed.     PLEASE MAKE SURE TO ELEVATE YOUR FEET & LEGS ABOVE YOUR HEART WHILE SITTING, THIS WILL HELP WITH THE SWELLING ALSO.     Adopting a Healthy Lifestyle.  Know what a healthy weight is for you (roughly BMI <25) and aim to maintain this   Aim for 7+ servings of fruits and vegetables daily   65-80+ fluid ounces of water or unsweet tea for healthy kidneys   Limit to max 1 drink of alcohol per day; avoid smoking/tobacco   Limit animal fats in diet for cholesterol and heart health - choose grass fed whenever available   Avoid highly processed foods, and foods high in saturated/trans fats   Aim for low stress - take time to unwind and care for your mental health   Aim for 150 min of moderate intensity exercise weekly for heart health, and weights twice weekly for bone health   Aim for 7-9 hours of sleep daily   When it comes to diets, agreement about the perfect plan isnt easy to find, even among the experts. Experts at the [employer] developed an idea known as the Healthy Eating Plate. Just imagine a plate divided into logical, healthy portions.   The emphasis is on diet quality:   Load up on vegetables and fruits - one-half of your  plate: Aim for color and variety, and remember that potatoes dont count.   Go for whole grains - one-quarter of your plate: Whole wheat, barley, wheat berries, quinoa, oats, brown rice, and foods made with them. If you want pasta, go with whole wheat pasta.   Protein power - one-quarter of your plate: Fish, chicken, beans, and nuts are all healthy, versatile protein sources. Limit red meat.   The diet, however, does go beyond the plate, offering a few other suggestions.   Use healthy plant oils, such as olive, canola, soy, corn, sunflower and peanut. Check the labels, and avoid partially hydrogenated oil, which have unhealthy trans fats.   If youre thirsty, drink water. Coffee and tea are good in moderation, but skip sugary drinks and limit milk and dairy products to one or two daily servings.   The type of carbohydrate in the diet is more important than the amount. Some sources of carbohydrates, such as vegetables, fruits, whole grains, and beans-are healthier than others.   Finally, stay active  Signed, Berniece Salines, DO  07/28/2021 8:59 AM    [place]

## 2021-07-28 NOTE — Patient Instructions (Addendum)
Medication Instructions:  Your physician has recommended you make the following change in your medication:  START: Midodrine 5 mg every 8 hours *If you need a refill on your cardiac medications before your next appointment, please call your pharmacy*   Lab Work: Your physician recommends that you return for lab work in:  TODAY: BMP, CBC If you have labs (blood work) drawn today and your tests are completely normal, you will receive your results only by: [place] (if you have [place]) OR A paper copy in the mail If you have any lab test that is abnormal or we need to change your treatment, we will call you to review the results.   Testing/Procedures: None   Follow-Up: At [facility], you and your health needs are our priority.  As part of our continuing mission to provide you with exceptional heart care, we have created designated Provider Care Teams.  These Care Teams include your primary Cardiologist (physician) and Advanced Practice Providers (APPs -  Physician Assistants and Nurse Practitioners) who all work together to provide you with the care you need, when you need it.  We recommend signing up for the patient portal called "MyChart".  Sign up information is provided on this After Visit Summary.  MyChart is used to connect with patients for Virtual Visits (Telemedicine).  Patients are able to view lab/test results, encounter notes, upcoming appointments, etc.  Non-urgent messages can be sent to your provider as well.   To learn more about what you can do with MyChart, go to [email or web address].    Your next appointment:   12 week(s)  The format for your next appointment:   In Person  Provider:   Berniece Salines, DO     Other Instructions  PLEASE PURCHASE AND WEAR COMPRESSION STOCKINGS DAILY AND TAKE OFF AT BEDTIME.  I recommend getting support socks/stockings. 10 mmHg is the preferred amount of compression.    Compression stockings are elastic socks that  squeeze the legs. They help to increase blood flow to the legs and to decrease swelling in the legs from fluid retention, and reduce the chance of developing blood clots in the lower legs. Please put on in the AM when dressing and off at night when dressing for bed.     PLEASE MAKE SURE TO ELEVATE YOUR FEET & LEGS ABOVE YOUR HEART WHILE SITTING, THIS WILL HELP WITH THE SWELLING ALSO.

## 2021-08-01 ENCOUNTER — Encounter (HOSPITAL_BASED_OUTPATIENT_CLINIC_OR_DEPARTMENT_OTHER): Payer: Self-pay | Admitting: Cardiovascular Disease

## 2021-08-01 NOTE — Procedures (Signed)
Patient Name: Brandy [name], Brandy [name] Date: 07/14/2021 Gender: Female D.O.B: Dec 19, 1965 Age (years): 56 Referring Provider: Godfrey Pick Tobb DO Height (inches): 67 Interpreting Physician: Shelva Majestic MD, ABSM Weight (lbs): 214 RPSGT: Laren Everts BMI: 34 MRN: [id number] Neck Size: 16.00  CLINICAL INFORMATION Sleep Study Type: NPSG  Indication for sleep study: Fatigue, Morning Headaches, Obesity, Snoring, Witnessed Apneas  Epworth Sleepiness Score: 8  SLEEP STUDY TECHNIQUE As per the AASM Manual for the Scoring of Sleep and Associated Events v2.3 (April 2016) with a hypopnea requiring 4% desaturations.  The channels recorded and monitored were frontal, central and occipital EEG, electrooculogram (EOG), submentalis EMG (chin), nasal and oral airflow, thoracic and abdominal wall motion, anterior tibialis EMG, snore microphone, electrocardiogram, and pulse oximetry.  MEDICATIONS abemaciclib (VERZENIO) 100 MG tablet acetaminophen (TYLENOL) 500 MG tablet albuterol (PROVENTIL) (2.5 MG/3ML) 0.083% nebulizer solution ALPRAZolam (XANAX) 1 MG tablet amitriptyline (ELAVIL) 75 MG tablet carbamide peroxide (DEBROX) 6.5 % OTIC solution Carboxymethylcellul-Glycerin (LUBRICATING EYE DROPS OP) Cyanocobalamin (B-12 PO) diphenoxylate-atropine (LOMOTIL) 2.5-0.025 MG tablet fluticasone-salmeterol (ADVAIR) 250-50 MCG/ACT AEPB HYDROcodone-acetaminophen (NORCO/VICODIN) 5-325 MG tablet loperamide (IMODIUM) 2 MG capsule magnesium oxide (MAG-OX) 400 MG tablet midodrine (PROAMATINE) 5 MG tablet Multiple Vitamin (MULTIVITAMIN WITH MINERALS) TABS tablet omeprazole (PRILOSEC OTC) 20 MG tablet ondansetron (ZOFRAN) 8 MG tablet rosuvastatin (CRESTOR) 10 MG tablet Vitamin D, Ergocalciferol, (DRISDOL) 1.25 MG (50000 UNIT) CAPS capsule White Petrolatum-Mineral Oil (LUBRICANT EYE NIGHTTIME) OINT  Medications self-administered by patient taken the night of the study : Verzenio, CRESTOR, ELAVIL,  [name], [name] The study was initiated at 10:53:59 PM and ended at 5:01:33 AM.  Sleep onset time was 93.3 minutes and the sleep efficiency was 63.1%%. The total sleep time was 232 minutes.  Stage REM latency was 128.5 minutes.  The patient spent 5.8%% of the night in stage N1 sleep, 81.7%% in stage N2 sleep, 0.0%% in stage N3 and 12.5% in REM.  Alpha intrusion was absent.  Supine sleep was 21.55%.  RESPIRATORY PARAMETERS The overall apnea/hypopnea index (AHI) was 6.2 per hour. The respiratory disturbance index (RDI) was 6.5/h. There were 0 total apneas, including 0 obstructive, 0 central and 0 mixed apneas. There were 24 hypopneas and 1 RERAs.  The AHI during Stage REM sleep was 18.6 per hour.  AHI while supine was 10.8 per hour.  The mean oxygen saturation was 88.9%. The minimum SpO2 during sleep was 81.0%.  Soft snoring was noted during this study primarily in the supine position.   CARDIAC DATA The 2 lead EKG demonstrated sinus rhythm. The mean heart rate was 86.8 beats per minute. Other EKG findings include: None.  LEG MOVEMENT DATA The total PLMS were 0 with a resulting PLMS index of 0.0. Associated arousal with leg movement index was 0.0 .  IMPRESSIONS - Mild obstructive sleep apnea overall (AHI 6.2/h; RDI 6.5/h); however, sleep apnea was moderate during REM sleep (AHI 18.6/h). - Moderate oxygen desaturation to a nadir of 81%. - The patient snored with soft snoring volume. - No cardiac abnormalities were noted during this study. - Clinically significant periodic limb movements did not occur during sleep. No significant associated arousals.  DIAGNOSIS - Obstructive Sleep Apnea (G47.33) - Nocturnal Hypoxemia (G47.36)  RECOMMENDATIONS - In this symptomatic patient recommend therapeutic CPAP to alleviate her sleep disordered breathing. Can initiate Auto-PAP with EPR of 3 at 6 - 16 cm of water.  - Effort should be made to optimize nasal and  oropharyngeal patency. - If patient is against CPAP institution  alternatives for CPAP therapy can be considered such as a customize oral appliance.  - Positional therapy avoiding supine position during sleep. - Avoid alcohol, sedatives and other CNS depressants that may worsen sleep apnea and disrupt normal sleep architecture. - Sleep hygiene should be reviewed to assess factors that may improve sleep quality. - Weight management (BMI 34) and regular exercise should be initiated or continued if appropriate.  [Electronically signed] 08/01/2021 06:39 PM  Shelva Majestic MD, [facility], [place], American Board of Sleep Medicine   NPI: [id number]  [place] PH: [phone]   FX: [phone] [place]

## 2021-08-02 ENCOUNTER — Telehealth: Payer: Self-pay | Admitting: *Deleted

## 2021-08-02 ENCOUNTER — Encounter: Payer: Self-pay | Admitting: Cardiology

## 2021-08-02 DIAGNOSIS — R5383 Other fatigue: Secondary | ICD-10-CM

## 2021-08-02 DIAGNOSIS — I951 Orthostatic hypotension: Secondary | ICD-10-CM

## 2021-08-02 NOTE — Telephone Encounter (Signed)
Patient returned a call to me and was given her sleep study results and recommendations. She agrees to proceed with CPAP therapy. [facility].

## 2021-08-02 NOTE — Addendum Note (Signed)
Addended by: Rexanne Mano B on: 08/02/2021 12:50 PM   Modules accepted: Orders

## 2021-08-03 ENCOUNTER — Other Ambulatory Visit (HOSPITAL_COMMUNITY): Payer: Self-pay

## 2021-08-07 ENCOUNTER — Encounter: Payer: Self-pay | Admitting: *Deleted

## 2021-08-07 NOTE — Congregational Nurse Program (Signed)
?  Dept: [phone] ? ? ?Congregational Nurse Program Note ? ?Date of Encounter: 08/07/2021 ? ?Past Medical History: ?Past Medical History:  ?Diagnosis Date  ? Acute pansinusitis 08/02/2017  ? Anxiety   ? Arthritis   ? ASD (atrial septal defect)   ? s/p closure with Amplatzer device 10/05/04 (Dr. Myriam Jacobson, [facility]) 10/05/04  ? Cancer [facility])   ? Cataract   ? Dyspnea   ? GERD (gastroesophageal reflux disease)   ? Heart murmur   ? no longer heard  ? History of hiatal hernia   ? Legally blind in right eye, as defined in [place]   ? Lumbar herniated disc   ? PONV (postoperative nausea and vomiting)   ? Sciatica   ? ? ?Encounter Details: ? CNP Questionnaire - 08/07/21 0904   ? ?  ? Questionnaire  ? Do you give verbal consent to treat you today? Yes   ? Location Patient Served  Not Applicable   ? Visit Setting Church or Organization   ? Patient Status Unknown   ? Insurance Medicare;Private or [place]   ? Insurance Referral N/A   ? Medication N/A   ? Medical Provider Yes   ? Screening Referrals N/A   ? Medical Referral N/A   ? Medical Appointment Made N/A   ? Food N/A   ? Transportation N/A   ? Housing/Utilities N/A   ? Interpersonal Safety N/A   ? Intervention Blood pressure   ? ED Visit Averted N/A   ? Life-Saving Intervention Made N/A   ? ?  ?  ? ?  ? ? ? ? ?

## 2021-08-07 NOTE — Congregational Nurse Program (Signed)
?  Dept: [phone] ? ? ?Congregational Nurse Program Note ? ?Date of Encounter: 08/07/2021 ? ?Past Medical History: ?Past Medical History:  ?Diagnosis Date  ? Acute pansinusitis 08/02/2017  ? Anxiety   ? Arthritis   ? ASD (atrial septal defect)   ? s/p closure with Amplatzer device 10/05/04 (Dr. Myriam Jacobson, [facility]) 10/05/04  ? Cancer [facility])   ? Cataract   ? Dyspnea   ? GERD (gastroesophageal reflux disease)   ? Heart murmur   ? no longer heard  ? History of hiatal hernia   ? Legally blind in right eye, as defined in [place]   ? Lumbar herniated disc   ? PONV (postoperative nausea and vomiting)   ? Sciatica   ? ? ?Encounter Details: ? ?Spoke to Mrs. [place], will make referrals to counseling and housing agencies. Will also provide printed education on hypotension. [place] Payment M  ? ? ?

## 2021-08-16 ENCOUNTER — Inpatient Hospital Stay: Payer: Medicare Other

## 2021-08-16 ENCOUNTER — Other Ambulatory Visit: Payer: Self-pay

## 2021-08-16 ENCOUNTER — Inpatient Hospital Stay: Payer: Medicare Other | Attending: Hematology

## 2021-08-16 ENCOUNTER — Other Ambulatory Visit: Payer: Self-pay | Admitting: Physician Assistant

## 2021-08-16 VITALS — BP 147/79 | HR 106 | Temp 98.0°F | Resp 18

## 2021-08-16 DIAGNOSIS — Z87891 Personal history of nicotine dependence: Secondary | ICD-10-CM | POA: Diagnosis not present

## 2021-08-16 DIAGNOSIS — Z801 Family history of malignant neoplasm of trachea, bronchus and lung: Secondary | ICD-10-CM | POA: Insufficient documentation

## 2021-08-16 DIAGNOSIS — Z833 Family history of diabetes mellitus: Secondary | ICD-10-CM | POA: Insufficient documentation

## 2021-08-16 DIAGNOSIS — C7801 Secondary malignant neoplasm of right lung: Secondary | ICD-10-CM | POA: Diagnosis not present

## 2021-08-16 DIAGNOSIS — Z8261 Family history of arthritis: Secondary | ICD-10-CM | POA: Diagnosis not present

## 2021-08-16 DIAGNOSIS — Z79899 Other long term (current) drug therapy: Secondary | ICD-10-CM | POA: Diagnosis not present

## 2021-08-16 DIAGNOSIS — C50919 Malignant neoplasm of unspecified site of unspecified female breast: Secondary | ICD-10-CM

## 2021-08-16 DIAGNOSIS — C50511 Malignant neoplasm of lower-outer quadrant of right female breast: Secondary | ICD-10-CM | POA: Diagnosis not present

## 2021-08-16 DIAGNOSIS — Z8249 Family history of ischemic heart disease and other diseases of the circulatory system: Secondary | ICD-10-CM | POA: Insufficient documentation

## 2021-08-16 DIAGNOSIS — Z170 Estrogen receptor positive status [ER+]: Secondary | ICD-10-CM | POA: Insufficient documentation

## 2021-08-16 DIAGNOSIS — Z5111 Encounter for antineoplastic chemotherapy: Secondary | ICD-10-CM | POA: Insufficient documentation

## 2021-08-16 LAB — CBC WITH DIFFERENTIAL (CANCER CENTER ONLY)
Abs Immature Granulocytes: 0.01 10*3/uL (ref 0.00–0.07)
Basophils Absolute: 0 10*3/uL (ref 0.0–0.1)
Basophils Relative: 0 %
Eosinophils Absolute: 0 10*3/uL (ref 0.0–0.5)
Eosinophils Relative: 1 %
HCT: 34.6 % — ABNORMAL LOW (ref 36.0–46.0)
Hemoglobin: 11.7 g/dL — ABNORMAL LOW (ref 12.0–15.0)
Immature Granulocytes: 0 %
Lymphocytes Relative: 63 %
Lymphs Abs: 2.9 10*3/uL (ref 0.7–4.0)
MCH: 34.9 pg — ABNORMAL HIGH (ref 26.0–34.0)
MCHC: 33.8 g/dL (ref 30.0–36.0)
MCV: 103.3 fL — ABNORMAL HIGH (ref 80.0–100.0)
Monocytes Absolute: 0.2 10*3/uL (ref 0.1–1.0)
Monocytes Relative: 5 %
Neutro Abs: 1.5 10*3/uL — ABNORMAL LOW (ref 1.7–7.7)
Neutrophils Relative %: 31 %
Platelet Count: 237 10*3/uL (ref 150–400)
RBC: 3.35 MIL/uL — ABNORMAL LOW (ref 3.87–5.11)
RDW: 15.4 % (ref 11.5–15.5)
WBC Count: 4.7 10*3/uL (ref 4.0–10.5)
nRBC: 0 % (ref 0.0–0.2)

## 2021-08-16 LAB — CMP (CANCER CENTER ONLY)
ALT: 64 U/L — ABNORMAL HIGH (ref 0–44)
AST: 53 U/L — ABNORMAL HIGH (ref 15–41)
Albumin: 3.8 g/dL (ref 3.5–5.0)
Alkaline Phosphatase: 206 U/L — ABNORMAL HIGH (ref 38–126)
Anion gap: 5 (ref 5–15)
BUN: 16 mg/dL (ref 6–20)
CO2: 32 mmol/L (ref 22–32)
Calcium: 9.5 mg/dL (ref 8.9–10.3)
Chloride: 103 mmol/L (ref 98–111)
Creatinine: 1.36 mg/dL — ABNORMAL HIGH (ref 0.44–1.00)
GFR, Estimated: 45 mL/min — ABNORMAL LOW (ref >60)
Glucose, Bld: 102 mg/dL — ABNORMAL HIGH (ref 70–99)
Potassium: 4 mmol/L (ref 3.5–5.1)
Sodium: 140 mmol/L (ref 135–145)
Total Bilirubin: 0.6 mg/dL (ref 0.3–1.2)
Total Protein: 7.1 g/dL (ref 6.5–8.1)

## 2021-08-16 MED ORDER — FULVESTRANT 250 MG/5ML IM SOSY
500.0000 mg | PREFILLED_SYRINGE | Freq: Once | INTRAMUSCULAR | Status: AC
  Administered 2021-08-16: 500 mg via INTRAMUSCULAR
  Filled 2021-08-16: qty 10

## 2021-08-16 NOTE — Progress Notes (Signed)
Patient stated that she took her premeds prior to arival. ?

## 2021-08-18 ENCOUNTER — Other Ambulatory Visit: Payer: Self-pay | Admitting: Hematology

## 2021-08-18 DIAGNOSIS — C50919 Malignant neoplasm of unspecified site of unspecified female breast: Secondary | ICD-10-CM

## 2021-08-20 ENCOUNTER — Encounter: Payer: Self-pay | Admitting: Cardiology

## 2021-08-20 DIAGNOSIS — M5416 Radiculopathy, lumbar region: Secondary | ICD-10-CM

## 2021-08-20 NOTE — Telephone Encounter (Signed)
Will place referral to orthopedic then specialist office will call you to schedule an appointment  ?

## 2021-08-23 ENCOUNTER — Ambulatory Visit: Payer: Medicare Other

## 2021-08-23 ENCOUNTER — Other Ambulatory Visit: Payer: Self-pay

## 2021-08-23 ENCOUNTER — Other Ambulatory Visit: Payer: Self-pay | Admitting: Hematology

## 2021-08-23 ENCOUNTER — Encounter: Payer: Self-pay | Admitting: Neurology

## 2021-08-23 ENCOUNTER — Other Ambulatory Visit: Payer: Medicare Other

## 2021-08-23 DIAGNOSIS — R519 Headache, unspecified: Secondary | ICD-10-CM

## 2021-08-23 DIAGNOSIS — C50919 Malignant neoplasm of unspecified site of unspecified female breast: Secondary | ICD-10-CM

## 2021-08-23 MED ORDER — ABEMACICLIB 100 MG PO TABS: 100.0000 mg | ORAL_TABLET | Freq: Two times a day (BID) | ORAL | 0 refills | Status: DC

## 2021-08-26 ENCOUNTER — Ambulatory Visit (INDEPENDENT_AMBULATORY_CARE_PROVIDER_SITE_OTHER): Payer: Medicare Other

## 2021-08-26 ENCOUNTER — Other Ambulatory Visit: Payer: Self-pay

## 2021-08-26 ENCOUNTER — Ambulatory Visit: Payer: Medicare Other | Admitting: Specialist

## 2021-08-26 ENCOUNTER — Encounter: Payer: Self-pay | Admitting: Specialist

## 2021-08-26 VITALS — BP 112/74 | HR 94 | Ht 67.0 in | Wt 208.0 lb

## 2021-08-26 DIAGNOSIS — M4310 Spondylolisthesis, site unspecified: Secondary | ICD-10-CM

## 2021-08-26 DIAGNOSIS — M51369 Other intervertebral disc degeneration, lumbar region without mention of lumbar back pain or lower extremity pain: Secondary | ICD-10-CM

## 2021-08-26 DIAGNOSIS — M4712 Other spondylosis with myelopathy, cervical region: Secondary | ICD-10-CM

## 2021-08-26 DIAGNOSIS — C50919 Malignant neoplasm of unspecified site of unspecified female breast: Secondary | ICD-10-CM

## 2021-08-26 DIAGNOSIS — M5441 Lumbago with sciatica, right side: Secondary | ICD-10-CM | POA: Diagnosis not present

## 2021-08-26 DIAGNOSIS — M542 Cervicalgia: Secondary | ICD-10-CM | POA: Diagnosis not present

## 2021-08-26 DIAGNOSIS — R296 Repeated falls: Secondary | ICD-10-CM

## 2021-08-26 DIAGNOSIS — M5136 Other intervertebral disc degeneration, lumbar region: Secondary | ICD-10-CM | POA: Diagnosis not present

## 2021-08-26 DIAGNOSIS — I951 Orthostatic hypotension: Secondary | ICD-10-CM

## 2021-08-26 DIAGNOSIS — M4316 Spondylolisthesis, lumbar region: Secondary | ICD-10-CM

## 2021-08-26 NOTE — Progress Notes (Signed)
? ?Office Visit Note ?  ?Patient: Brandy [name]           ?Date of Birth: 07-Feb-1966           ?MRN: [id number] ?Visit Date: 08/26/2021 ?             ?Requested by: Sandrea Hughs, NP ?[address] ?[place],   [address] ?PCP: Ngetich, Nelda Bucks, NP ? ? ?Assessment & Plan: ?Visit Diagnoses:  ?1. Low back pain with right-sided sciatica, unspecified back pain laterality, unspecified chronicity   ?2. Spondylolisthesis of lumbar region   ?3. Grade 1 Anterolisthesis (L2-3, L3-4)   ?4. Grade 1 Retrolisthesis (L4-5, L5-S1)   ?5. Degenerative disc disease, lumbar   ? ?Plan: Avoid bending, stooping and avoid lifting weights greater than 10 lbs. ?Avoid prolong standing and walking. ?Avoid frequent bending and stooping  ?No lifting greater than 10 lbs. ?May use ice or moist heat for pain. ?Weight loss is of benefit. ?Handicap license is approved. ?MRI of the brain, cervical and thoracic spine ?If the MRI of the above are negative then a myelogram and post myelogram CT scan with standing flexion and extension radiographs can be done but ?The MRIs are necessary as brain or spinal cord compression can also cause the frequent falls and this needs to be evaluated.  ? ? ? ?Follow-Up Instructions: No follow-ups on file.  ? ?Orders:  ?Orders Placed This Encounter  ?Procedures  ? XR Lumbar Spine 2-3 Views  ? ?No orders of the defined types were placed in this encounter. ? ? ? ? Procedures: ?No procedures performed ? ? ?Clinical Data: ?No additional findings. ? ? ?Subjective: ?Chief Complaint  ?Patient presents with  ? Lower Back - Pain  ? ? ?56 year old female with history of multiple level degenerative disc disease and previous lateral recess stenosis post laminectomy surgery. She has been diagnosed with metastatic breast cancer and has undergone, on injections and medications. Had a lumpectomy and  a estrogen positive breast ca. She has known metastatic disease of the lung. Describes falling out spells where she can be walking  and then suddenly fall out. She is taking midodrine for low blood pressure.  She is having pain in the right upper buttock and weakness both legs with standing and walking. Using a cart to stand and grocery shop, feel more stable sitting.  ?No bowel or bladder difficulty. Has a right arm tremor. Previous ET scan 2-3 years ago. Oncologist is Dr. Irene Limbo, [place] for senoirs Marlowe Sax NP.  ? ?Review of Systems  ?Constitutional: Negative.   ?HENT: Negative.    ?Eyes: Negative.   ?Respiratory: Negative.    ?Cardiovascular: Negative.   ?Gastrointestinal: Negative.   ?Endocrine: Negative.   ?Genitourinary: Negative.   ?Musculoskeletal: Negative.   ?Skin: Negative.   ?Allergic/Immunologic: Negative.   ?Neurological: Negative.   ?Hematological: Negative.   ?Psychiatric/Behavioral: Negative.    ? ? ?Objective: ?Vital Signs: BP 112/74 (BP Location: Left Arm, Patient Position: Sitting)   Pulse 94   Ht '5\' 7"'$  (1.702 m)   Wt 208 lb (94.3 kg)   BMI 32.58 kg/m?  ? ?Physical Exam ?Constitutional:   ?   Appearance: She is well-developed.  ?HENT:  ?   Head: Normocephalic and atraumatic.  ?Eyes:  ?   Pupils: Pupils are equal, round, and reactive to light.  ?Pulmonary:  ?   Effort: Pulmonary effort is normal.  ?   Breath sounds: Normal breath sounds.  ?Abdominal:  ?  General: Bowel sounds are normal.  ?   Palpations: Abdomen is soft.  ?Musculoskeletal:  ?   Cervical back: Normal range of motion and neck supple.  ?Skin: ?   General: Skin is warm and dry.  ?Neurological:  ?   Mental Status: She is alert and oriented to person, place, and time.  ?Psychiatric:     ?   Behavior: Behavior normal.     ?   Thought Content: Thought content normal.     ?   Judgment: Judgment normal.  ? ?Back Exam  ? ?Tenderness  ?The patient is experiencing tenderness in the cervical and lumbar. ? ?Range of Motion  ?Extension:  abnormal  ?Flexion:  abnormal  ?Lateral bend right:  abnormal  ?Rotation right:  abnormal  ?Rotation left:  abnormal   ? ?Muscle Strength  ?Right Quadriceps:  5/5  ?Left Quadriceps:  5/5  ?Left Hamstrings:  5/5  ? ?Comments:  Left knee is 2/3 right knee reflex 0 ?SLR is negative ?Strength in the legs intact in a sitting position. ?Hoffmans sign is negative. ?Painful to palpation right SI and medial SI joint and L5-S1 level. ?Cervical spine ROM is decreased by 50% lateral bending and rotation as well as flexion and extension.  ? ? ? ?Specialty Comments:  ?No specialty comments available. ? ?Imaging: ?No results found. ? ? ?PMFS History: ?Patient Active Problem List  ? Diagnosis Date Noted  ? Spinal stenosis, 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  ?  Priority: High  ?  Class: Chronic  ? Left shoulder tendonitis 05/23/2016  ?  Priority: High  ?  Class: Acute  ? Snoring 07/14/2021  ? Fatigue 07/14/2021  ? Daytime somnolence 07/14/2021  ? Status post device closure of ASD 06/03/2021  ? Secundum ASD 06/03/2021  ? Decreased diffusion capacity 05/14/2021  ? Shortness of breath 03/04/2021  ? Restrictive lung disease 04/01/2020  ? Pulmonary nodules 04/01/2020  ? Elevated LFTs 03/26/2020  ? Metastatic breast cancer ([place]) 02/24/2020  ? Spondylosis without myelopathy or radiculopathy, lumbosacral region 12/31/2019  ? Chronic low back pain (Bilateral) w/o sciatica 12/31/2019  ? Chronic hip pain (3ry area of Pain) (Bilateral) (R>L) 11/27/2019  ? Chronic sacroiliac joint pain (Left) 11/27/2019  ? Chronic pain syndrome 11/11/2019  ? Pharmacologic therapy 11/11/2019  ? Disorder of skeletal system 11/11/2019  ? Problems influencing health status 11/11/2019  ? Abnormal MRI, lumbar spine (08/22/2018) 11/11/2019  ? Chronic low back pain (1ry area of Pain) (Bilateral) w/ sciatica (Bilateral) 11/11/2019  ? DDD (degenerative disc disease), lumbosacral 11/11/2019  ? Grade 1 Anterolisthesis (L2-3, L3-4) 11/11/2019  ? Grade 1 Retrolisthesis (L4-5, L5-S1) 11/11/2019  ? Lumbar facet hypertrophy (Multilevel) (Bilateral) 11/11/2019  ? Lumbar facet  syndrome (Multilevel) (Bilateral) (R>L) 11/11/2019  ? Chronic lower extremity pain (2ry area of Pain) (Bilateral) (R>L) 11/11/2019  ? Neurogenic pain 11/11/2019  ? Chronic musculoskeletal pain 11/11/2019  ? Infiltrating ductal carcinoma of breast ([place]) 10/08/2019  ? Diarrhea 09/04/2019  ? Fatty liver 09/04/2019  ? Nausea & vomiting 09/04/2019  ? Obesity (BMI 30-39.9) 09/04/2019  ? GERD (gastroesophageal reflux disease)   ? Hyperlipidemia   ? CKD (chronic kidney disease), stage III ([place])   ? Depression   ? Malignant hypertension   ? Transaminitis   ? Pneumonitis 08/04/2019  ? Breast cancer ([place]) 08/04/2019  ? Tachycardia   ? Ocular proptosis 02/14/2019  ? PVD (posterior vitreous detachment), left eye 02/14/2019  ? 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 Vitreous floaters of  left eye 02/14/2019  ? Bilateral leg weakness 01/24/2019  ? Insomnia 08/02/2017  ? Low back pain radiating to both legs 08/02/2017  ? Migraine 08/02/2017  ? Neuropathy 08/02/2017  ? 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 Anxiety disorder 01/07/2015  ? ?Past Medical History:  ?Diagnosis Date  ? Acute pansinusitis 08/02/2017  ? Anxiety   ? Arthritis   ? ASD (atrial septal defect)   ? s/p closure with Amplatzer device 10/05/04 (Dr. Myriam Jacobson, [facility]) 10/05/04  ? Cancer [facility])   ? Cataract   ? Dyspnea   ? GERD (gastroesophageal reflux disease)   ? Heart murmur   ? no longer heard  ? History of hiatal hernia   ? Legally blind in right eye, as defined in [place]   ? Lumbar herniated disc   ? PONV (postoperative nausea and vomiting)   ? Sciatica   ?  ?Family History  ?Problem Relation Age of Onset  ? Arthritis Mother   ? Hypertension Mother   ? Diabetes Mother   ? High blood pressure Mother   ? Cancer Father   ?     Lung  ? Colon cancer Neg Hx   ? Colon polyps Neg Hx   ? Esophageal cancer Neg Hx   ? Rectal cancer Neg Hx   ? Stomach cancer Neg Hx   ?  ?Past Surgical History:  ?Procedure Laterality Date  ? ABLATION    ? BREAST  LUMPECTOMY WITH RADIOACTIVE SEED AND SENTINEL LYMPH NODE BIOPSY Right 11/23/2020  ? Procedure: RIGHT BREAST LUMPECTOMY WITH RADIOACTIVE SEED AND RIGHT AXILLARY SENTINEL LYMPH NODE BIOPSY;  Surgeon: Rolm Bookbinder,

## 2021-08-26 NOTE — Patient Instructions (Addendum)
Plan: Avoid bending, stooping and avoid lifting weights greater than 10 lbs. ?Avoid prolong standing and walking. ?Avoid frequent bending and stooping  ?No lifting greater than 10 lbs. ?May use ice or moist heat for pain. ?Weight loss is of benefit. ?Handicap license is approved. ?MRI of the brain, cervical and thoracic spine ?If the MRI of the above are negative then a myelogram and post myelogram CT scan with standing flexion and extension radiographs can be done but ?The MRIs are necessary as brain or spinal cord compression can also cause the frequent falls and this needs to be evaluated.  ?Endocrinology consult, has been seen at [facility] for pulmonary in the past.  ?

## 2021-09-01 DIAGNOSIS — H02103 Unspecified ectropion of right eye, unspecified eyelid: Secondary | ICD-10-CM | POA: Diagnosis not present

## 2021-09-05 ENCOUNTER — Encounter: Payer: Self-pay | Admitting: Hematology

## 2021-09-06 ENCOUNTER — Ambulatory Visit: Payer: Medicare Other | Admitting: Pulmonary Disease

## 2021-09-06 NOTE — Progress Notes (Deleted)
? ?Subjective:  ? ?PATIENT ID: Brandy [name] DOB: 1965/11/24, MRN: [id number] ? ? ?HPI ? ?No chief complaint on file. ? ?Reason for Visit: Follow-up ? ?[name] is a 56 year old [name] former smoker with metastatic breast cancer, obesity, chronic diastolic heart failure, chronic pain, anxiety, ASD s/p post-repair in 2006 and insomnia who presents for follow-up ? ?Synopsis: ?Initially referred for evaluation of shortness of breath. PFTs with mild restrictive defect and reduced DLCO. On verzenio and faslodex for metastatic breast cancer ?2021 - Methacholine challenge negative. Prior pulmonary nodules decreased in size since 02/2019, likely representing mets post-treatment. CT with no ILD. ?2022 - S/p right lumpectomy in June with no breast cancer progression. Worsening dyspnea with unclear cause. ? ?03/04/21 ?In the last six months, she reports shortness of breath that has gradually worsened. Some coughing, nonproductive. No wheezing. Will get lightheaded and dizzy with activity. No significant respiratory infections since our last visit. She reports possible aspiration when eating/drinking. She has reflux as well that intermittently occurs. She is enrolled in [employer] and states that the chair exercises are sometimes difficult. ? ?05/14/21 ?Since our last visit she has been compliant with her [employer] however has not noticed any improvement. However her albuterol nebulizer helps and uses 1-2 times daily.. She still has shortness of breath and nonproductive cough. Her throat will burn at times. She is taking prilosec 20 mg daily. ? ?06/21/21 ?Since our last visit ICS inhaler strength was increased however has not picked up due to insurance costs (~$130) which is not affordable for her. She has some chest pressure associated with shortness of breath. She has been seen by Cardiology and work-up including CT coronary neg. She reports she has orthostatic hypotension which was confirmed at  her primary care office. This will affect her breathing as she will be presyncopal along with anxiety.  ? ?09/06/21 ? ?Social History: ?Quit smoking in 2020. 1/4 ppd x 25 years.  ?Stressed about her husband, poor housing situation ? ?Past Medical History:  ?Diagnosis Date  ? Acute pansinusitis 08/02/2017  ? Anxiety   ? Arthritis   ? ASD (atrial septal defect)   ? s/p closure with Amplatzer device 10/05/04 (Dr. Myriam Jacobson, [facility]) 10/05/04  ? Cancer [facility])   ? Cataract   ? Dyspnea   ? GERD (gastroesophageal reflux disease)   ? Heart murmur   ? no longer heard  ? History of hiatal hernia   ? Legally blind in right eye, as defined in [place]   ? Lumbar herniated disc   ? PONV (postoperative nausea and vomiting)   ? Sciatica   ?  ? ?Allergies  ?Allergen Reactions  ? Zithromax [Azithromycin] Shortness Of Breath and Itching  ?  TOTAL BODY ITCHING [EVEN SOLES OF [place] ?WHEEZING ?  ? Other   ?  z -pack  ? Psyllium Nausea And Vomiting  ?  ? ?Outpatient Medications Prior to Visit  ?Medication Sig Dispense Refill  ? abemaciclib (VERZENIO) 100 MG tablet Take 1 tablet (100 mg total) by mouth 2 (two) times daily. Swallow tablets whole. Do not chew, crush, or split tablets before swallowing. 56 tablet 0  ? acetaminophen (TYLENOL) 500 MG tablet Take 500-1,000 mg by mouth every 6 (six) hours as needed for moderate pain or headache.    ? albuterol (PROVENTIL) (2.5 MG/3ML) 0.083% nebulizer solution Take 3 mLs (2.5 mg total) by nebulization every 6 (six) hours as needed for wheezing or shortness of breath. 180 mL  1  ? ALPRAZolam (XANAX) 1 MG tablet Take 1 tablet (1 mg total) by mouth at bedtime. 30 tablet 5  ? amitriptyline (ELAVIL) 75 MG tablet Take 1 tablet (75 mg total) by mouth at bedtime. 90 tablet 1  ? carbamide peroxide (DEBROX) 6.5 % OTIC solution Place 5 drops into the right ear 2 (two) times daily. 15 mL 0  ? Carboxymethylcellul-Glycerin (LUBRICATING EYE DROPS OP) Place 1 drop into the left eye 4 (four) times daily.    ?  Cyanocobalamin (B-12 PO) Take 1 tablet by mouth daily.    ? diphenoxylate-atropine (LOMOTIL) 2.5-0.025 MG tablet Take 1 tablet by mouth 3 (three) times daily as needed for diarrhea or loose stools. 30 tablet 0  ? fluticasone-salmeterol (ADVAIR) 250-50 MCG/ACT AEPB Inhale 1 puff into the lungs every 12 (twelve) hours. 60 each 5  ? HYDROcodone-acetaminophen (NORCO/VICODIN) 5-325 MG tablet Take 1 tablet by mouth every 6 (six) hours as needed. 10 tablet 0  ? loperamide (IMODIUM) 2 MG capsule Take 1-2 capsules (2-4 mg total) by mouth 4 (four) times daily as needed for diarrhea or loose stools. 30 capsule 1  ? magnesium oxide (MAG-OX) 400 MG tablet Take 1 tablet (400 mg total) by mouth 2 (two) times daily. 14 tablet 0  ? midodrine (PROAMATINE) 5 MG tablet Take 1 tablet (5 mg total) by mouth every 8 (eight) hours. 270 tablet 3  ? Multiple Vitamin (MULTIVITAMIN WITH MINERALS) TABS tablet Take 1 tablet by mouth daily.    ? omeprazole (PRILOSEC OTC) 20 MG tablet Take 20 mg by mouth daily.    ? ondansetron (ZOFRAN) 8 MG tablet TAKE 1 TABLET(8 MG) BY MOUTH EVERY 8 HOURS AS NEEDED FOR NAUSEA OR VOMITING 30 tablet 3  ? rosuvastatin (CRESTOR) 10 MG tablet Take 1 tablet (10 mg total) by mouth daily. 90 tablet 3  ? Vitamin D, Ergocalciferol, (DRISDOL) 1.25 MG (50000 UNIT) CAPS capsule TAKE 1 CAPSULE ONCE PER WEEK 12 capsule 3  ? White Petrolatum-Mineral Oil (LUBRICANT EYE NIGHTTIME) OINT Place 1 drop into the left eye at bedtime.    ? ?No facility-administered medications prior to visit.  ? ? ?Review of Systems  ?Constitutional:  Negative for chills, diaphoresis, fever, malaise/fatigue and weight loss.  ?HENT:  Negative for congestion.   ?Respiratory:  Positive for shortness of breath. Negative for cough, hemoptysis, sputum production and wheezing.   ?Cardiovascular:  Positive for chest pain (chest pressure) and orthopnea. Negative for palpitations and leg swelling.  ?Psychiatric/Behavioral:  The patient is nervous/anxious.    ? ?Objective:  ? ?There were no vitals filed for this visit. ?  ? ?Physical Exam: ?General: Well-appearing, no acute distress ?HENT: [place], AT ?Eyes: Right eye patch in place, left EOMI, no scleral icterus ?Respiratory: Clear to auscultation bilaterally.  No crackles, wheezing or rales ?Cardiovascular: RRR, -M/R/G, no JVD ?Extremities:-Edema,-tenderness ?Neuro: AAO x4, CNII-XII grossly intact ?Psych: Normal mood, normal affect ? ?Data Reviewed: ? ?Imaging: ?CTA 10/30/19 - No pulmonary embolism. RUL 10x66m, 555m unchanged. RLL nodule 51m48mew ?CT Chest 05/21/20 - No evidence of ILD. 1.4 x 1.0 x 0.8 cm aggressive appearing nodule near the apex of ?the right upper lobe ?PET 08/07/20 - 8 mm irregular nodule in right apex improved compared to CT 2020 ?CTA 07/12/2021-no pulmonary emboli.  Stable scattered pulmonary nodules measuring 8 x 12 mm in the right upper lobe  ? ?PFT: ?12/18/19 ?FVC 3.13 (83%) FEV1 2.49 (92%) Ratio 88  TLC 78% DLCO 58% ?Interpretation: Mild restrictive defect with moderate reduction  in gas exchange ? ?02/17/20 ?Negative methacholine challenge ? ?04/30/21 ?FVC 2.64 (85%) FEV1 2.32 (94%) Ratio 89  TLC 80% DLCO 59% ?Interpretation: Isolated moderate reduction in gas exchange ? ?Sleep: ?07/14/2021-mild OSA AHI 6.2 however moderate during REM sleep AHI 18.6.  Nadir SPO2 81%.  Recommend auto PAP 6-16 cmH20 with EPR 3 ? ?Echocardiogram: ?09/2019 -Normal EF, grade I DD, mild MR. ?06/03/21 - EF 60-65%. Normal diastolics. No valvular or WMA ? ?Coronary CT ?06/17/21 - Calcium score of 0 ? ?CBC ?   ?Component Value Date/Time  ? WBC 4.7 08/16/2021 1108  ? WBC 5.5 07/12/2021 1246  ? RBC 3.35 (L) 08/16/2021 1108  ? HGB 11.7 (L) 08/16/2021 1108  ? HGB 12.7 07/28/2021 0916  ? HCT 34.6 (L) 08/16/2021 1108  ? HCT 37.1 07/28/2021 0916  ? PLT 237 08/16/2021 1108  ? PLT 253 07/28/2021 0916  ? MCV 103.3 (H) 08/16/2021 1108  ? MCV 101 (H) 07/28/2021 0916  ? MCH 34.9 (H) 08/16/2021 1108  ? MCHC 33.8 08/16/2021 1108  ? RDW 15.4  08/16/2021 1108  ? RDW 13.5 07/28/2021 0916  ? LYMPHSABS 2.9 08/16/2021 1108  ? LYMPHSABS 3.2 (H) 07/28/2021 8185  ? MONOABS 0.2 08/16/2021 1108  ? EOSABS 0.0 08/16/2021 1108  ? EOSABS 0.1 07/28/2021 0916  ? BASOSABS 0.0 03/

## 2021-09-13 ENCOUNTER — Ambulatory Visit
Admission: RE | Admit: 2021-09-13 | Discharge: 2021-09-13 | Disposition: A | Discharge status: Home / Self Care | Payer: Medicare Other | Source: Ambulatory Visit | Attending: Specialist | Admitting: Specialist

## 2021-09-13 ENCOUNTER — Encounter: Payer: Self-pay | Admitting: Hematology

## 2021-09-13 ENCOUNTER — Other Ambulatory Visit: Payer: Self-pay | Admitting: Physician Assistant

## 2021-09-13 DIAGNOSIS — M4802 Spinal stenosis, cervical region: Secondary | ICD-10-CM | POA: Diagnosis not present

## 2021-09-13 DIAGNOSIS — M2578 Osteophyte, vertebrae: Secondary | ICD-10-CM | POA: Diagnosis not present

## 2021-09-13 DIAGNOSIS — R296 Repeated falls: Secondary | ICD-10-CM

## 2021-09-13 DIAGNOSIS — Z853 Personal history of malignant neoplasm of breast: Secondary | ICD-10-CM | POA: Diagnosis not present

## 2021-09-13 DIAGNOSIS — C50919 Malignant neoplasm of unspecified site of unspecified female breast: Secondary | ICD-10-CM

## 2021-09-13 DIAGNOSIS — M542 Cervicalgia: Secondary | ICD-10-CM | POA: Diagnosis not present

## 2021-09-13 DIAGNOSIS — G8929 Other chronic pain: Secondary | ICD-10-CM | POA: Diagnosis not present

## 2021-09-13 DIAGNOSIS — M4312 Spondylolisthesis, cervical region: Secondary | ICD-10-CM | POA: Diagnosis not present

## 2021-09-13 DIAGNOSIS — C50911 Malignant neoplasm of unspecified site of right female breast: Secondary | ICD-10-CM

## 2021-09-13 DIAGNOSIS — I639 Cerebral infarction, unspecified: Secondary | ICD-10-CM | POA: Diagnosis not present

## 2021-09-14 ENCOUNTER — Inpatient Hospital Stay: Payer: Medicare Other

## 2021-09-14 ENCOUNTER — Inpatient Hospital Stay: Payer: Medicare Other | Attending: Hematology

## 2021-09-14 ENCOUNTER — Inpatient Hospital Stay (HOSPITAL_BASED_OUTPATIENT_CLINIC_OR_DEPARTMENT_OTHER): Payer: Medicare Other | Admitting: Physician Assistant

## 2021-09-14 ENCOUNTER — Other Ambulatory Visit: Payer: Self-pay

## 2021-09-14 ENCOUNTER — Encounter (HOSPITAL_COMMUNITY): Payer: Self-pay

## 2021-09-14 VITALS — BP 128/56 | HR 87 | Temp 97.9°F | Resp 18

## 2021-09-14 VITALS — BP 131/76 | HR 89 | Temp 97.8°F | Resp 18 | Ht 67.0 in | Wt 213.9 lb

## 2021-09-14 DIAGNOSIS — C50919 Malignant neoplasm of unspecified site of unspecified female breast: Secondary | ICD-10-CM

## 2021-09-14 DIAGNOSIS — Z801 Family history of malignant neoplasm of trachea, bronchus and lung: Secondary | ICD-10-CM | POA: Diagnosis not present

## 2021-09-14 DIAGNOSIS — R7989 Other specified abnormal findings of blood chemistry: Secondary | ICD-10-CM | POA: Diagnosis not present

## 2021-09-14 DIAGNOSIS — Z8261 Family history of arthritis: Secondary | ICD-10-CM | POA: Insufficient documentation

## 2021-09-14 DIAGNOSIS — I700 Atherosclerosis of aorta: Secondary | ICD-10-CM | POA: Insufficient documentation

## 2021-09-14 DIAGNOSIS — K219 Gastro-esophageal reflux disease without esophagitis: Secondary | ICD-10-CM | POA: Diagnosis not present

## 2021-09-14 DIAGNOSIS — Z5111 Encounter for antineoplastic chemotherapy: Secondary | ICD-10-CM | POA: Insufficient documentation

## 2021-09-14 DIAGNOSIS — Z170 Estrogen receptor positive status [ER+]: Secondary | ICD-10-CM | POA: Diagnosis not present

## 2021-09-14 DIAGNOSIS — Z8249 Family history of ischemic heart disease and other diseases of the circulatory system: Secondary | ICD-10-CM | POA: Diagnosis not present

## 2021-09-14 DIAGNOSIS — M5031 Other cervical disc degeneration,  high cervical region: Secondary | ICD-10-CM | POA: Diagnosis not present

## 2021-09-14 DIAGNOSIS — Z79899 Other long term (current) drug therapy: Secondary | ICD-10-CM | POA: Insufficient documentation

## 2021-09-14 DIAGNOSIS — Z87891 Personal history of nicotine dependence: Secondary | ICD-10-CM | POA: Insufficient documentation

## 2021-09-14 DIAGNOSIS — C7800 Secondary malignant neoplasm of unspecified lung: Secondary | ICD-10-CM | POA: Diagnosis not present

## 2021-09-14 DIAGNOSIS — K760 Fatty (change of) liver, not elsewhere classified: Secondary | ICD-10-CM | POA: Diagnosis not present

## 2021-09-14 DIAGNOSIS — R296 Repeated falls: Secondary | ICD-10-CM | POA: Diagnosis not present

## 2021-09-14 DIAGNOSIS — M542 Cervicalgia: Secondary | ICD-10-CM | POA: Diagnosis not present

## 2021-09-14 DIAGNOSIS — R5383 Other fatigue: Secondary | ICD-10-CM | POA: Insufficient documentation

## 2021-09-14 DIAGNOSIS — Z833 Family history of diabetes mellitus: Secondary | ICD-10-CM | POA: Diagnosis not present

## 2021-09-14 DIAGNOSIS — C50511 Malignant neoplasm of lower-outer quadrant of right female breast: Secondary | ICD-10-CM | POA: Diagnosis not present

## 2021-09-14 DIAGNOSIS — M4802 Spinal stenosis, cervical region: Secondary | ICD-10-CM | POA: Diagnosis not present

## 2021-09-14 DIAGNOSIS — C50911 Malignant neoplasm of unspecified site of right female breast: Secondary | ICD-10-CM

## 2021-09-14 LAB — CBC WITH DIFFERENTIAL (CANCER CENTER ONLY)
Abs Immature Granulocytes: 0.01 10*3/uL (ref 0.00–0.07)
Basophils Absolute: 0 10*3/uL (ref 0.0–0.1)
Basophils Relative: 1 %
Eosinophils Absolute: 0.1 10*3/uL (ref 0.0–0.5)
Eosinophils Relative: 1 %
HCT: 33.5 % — ABNORMAL LOW (ref 36.0–46.0)
Hemoglobin: 11.3 g/dL — ABNORMAL LOW (ref 12.0–15.0)
Immature Granulocytes: 0 %
Lymphocytes Relative: 59 %
Lymphs Abs: 2.8 10*3/uL (ref 0.7–4.0)
MCH: 35.2 pg — ABNORMAL HIGH (ref 26.0–34.0)
MCHC: 33.7 g/dL (ref 30.0–36.0)
MCV: 104.4 fL — ABNORMAL HIGH (ref 80.0–100.0)
Monocytes Absolute: 0.3 10*3/uL (ref 0.1–1.0)
Monocytes Relative: 7 %
Neutro Abs: 1.5 10*3/uL — ABNORMAL LOW (ref 1.7–7.7)
Neutrophils Relative %: 32 %
Platelet Count: 219 10*3/uL (ref 150–400)
RBC: 3.21 MIL/uL — ABNORMAL LOW (ref 3.87–5.11)
RDW: 15.1 % (ref 11.5–15.5)
Smear Review: NORMAL
WBC Count: 4.7 10*3/uL (ref 4.0–10.5)
nRBC: 0 % (ref 0.0–0.2)

## 2021-09-14 LAB — CMP (CANCER CENTER ONLY)
ALT: 97 U/L — ABNORMAL HIGH (ref 0–44)
AST: 72 U/L — ABNORMAL HIGH (ref 15–41)
Albumin: 3.8 g/dL (ref 3.5–5.0)
Alkaline Phosphatase: 233 U/L — ABNORMAL HIGH (ref 38–126)
Anion gap: 5 (ref 5–15)
BUN: 19 mg/dL (ref 6–20)
CO2: 31 mmol/L (ref 22–32)
Calcium: 9.2 mg/dL (ref 8.9–10.3)
Chloride: 106 mmol/L (ref 98–111)
Creatinine: 1.48 mg/dL — ABNORMAL HIGH (ref 0.44–1.00)
GFR, Estimated: 41 mL/min — ABNORMAL LOW (ref >60)
Glucose, Bld: 99 mg/dL (ref 70–99)
Potassium: 3.9 mmol/L (ref 3.5–5.1)
Sodium: 142 mmol/L (ref 135–145)
Total Bilirubin: 0.5 mg/dL (ref 0.3–1.2)
Total Protein: 7.3 g/dL (ref 6.5–8.1)

## 2021-09-14 MED ORDER — FULVESTRANT 250 MG/5ML IM SOSY
500.0000 mg | PREFILLED_SYRINGE | Freq: Once | INTRAMUSCULAR | Status: AC
  Administered 2021-09-14: 500 mg via INTRAMUSCULAR

## 2021-09-16 ENCOUNTER — Encounter: Payer: Self-pay | Admitting: Hematology
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
## 2021-09-17 ENCOUNTER — Ambulatory Visit: Payer: Self-pay

## 2021-09-17 ENCOUNTER — Encounter: Payer: Self-pay | Admitting: Hematology

## 2021-09-17 ENCOUNTER — Encounter: Payer: Self-pay | Admitting: Specialist

## 2021-09-17 ENCOUNTER — Ambulatory Visit: Payer: Medicare Other | Admitting: Specialist

## 2021-09-17 VITALS — BP 151/77 | HR 102 | Ht 67.0 in | Wt 213.0 lb

## 2021-09-17 DIAGNOSIS — R296 Repeated falls: Secondary | ICD-10-CM | POA: Diagnosis not present

## 2021-09-17 DIAGNOSIS — M4310 Spondylolisthesis, site unspecified: Secondary | ICD-10-CM | POA: Diagnosis not present

## 2021-09-17 DIAGNOSIS — M5136 Other intervertebral disc degeneration, lumbar region: Secondary | ICD-10-CM

## 2021-09-17 DIAGNOSIS — M4712 Other spondylosis with myelopathy, cervical region: Secondary | ICD-10-CM | POA: Diagnosis not present

## 2021-09-17 DIAGNOSIS — M5441 Lumbago with sciatica, right side: Secondary | ICD-10-CM | POA: Diagnosis not present

## 2021-09-17 DIAGNOSIS — M4316 Spondylolisthesis, lumbar region: Secondary | ICD-10-CM

## 2021-09-17 MED ORDER — HYDROCODONE-ACETAMINOPHEN 5-325 MG PO TABS: 1.0000 | ORAL_TABLET | Freq: Four times a day (QID) | ORAL | 0 refills | Status: DC | PRN

## 2021-09-17 MED ORDER — METHYLPREDNISOLONE 4 MG PO TBPK: ORAL_TABLET | ORAL | 0 refills | Status: DC

## 2021-09-17 NOTE — Progress Notes (Signed)
? ?Office Visit Note ?  ?Patient: Brandy [name]           ?Date of Birth: Nov 15, 1965           ?MRN: [id number] ?Visit Date: 09/17/2021 ?             ?Requested by: Sandrea Hughs, NP ?[address] ?[place],  [place] [address] ?PCP: Ngetich, Nelda Bucks, NP ? ? ?Assessment & Plan: ?Visit Diagnoses:  ?1. Spondylolisthesis of lumbar region   ?2. Grade 1 Anterolisthesis (L2-3, L3-4)   ?3. Grade 1 Retrolisthesis (L4-5, L5-S1)   ?4. Degenerative disc disease, lumbar   ?5. Other spondylosis with myelopathy, cervical region   ?6. Falls frequently   ?7. Low back pain with right-sided sciatica, unspecified back pain laterality, unspecified chronicity   ? ? ?Plan: Avoid bending, stooping and avoid lifting weights greater than 10 lbs. ?Avoid prolong standing and walking. ?Avoid frequent bending and stooping  ?No lifting greater than 10 lbs. ?May use ice or moist heat for pain. ?Weight loss is of benefit. ?Handicap license is approved. ?MRI of the lumbar spine is ordered . ? ?Follow-Up Instructions: Return in about 4 weeks (around 10/15/2021).  ? ?Orders:  ?Orders Placed This Encounter  ?Procedures  ? XR SCOLIOSIS EVAL COMPLETE SPINE 2 OR 3 VIEWS  ? MR Lumbar Spine w/o contrast  ? ?Meds ordered this encounter  ?Medications  ? HYDROcodone-acetaminophen (NORCO/VICODIN) 5-325 MG tablet  ?  Sig: Take 1 tablet by mouth every 6 (six) hours as needed.  ?  Dispense:  10 tablet  ?  Refill:  0  ? ? ? ? Procedures: ?No procedures performed ? ? ?Clinical Data: ?No additional findings. ? ? ?Subjective: ?Chief Complaint  ?Patient presents with  ? Spine - Follow-up  ?  MRI review  ? ? ?56 year old female with history of left lumbar laminectomy. ?She has been having ongoing back pain with buttock and thigh pain. Pain that feels muscular and limits her ability to stand, walk or bend or stoop.  ?No bowel or bladder difficulty. She has had surgery for the right eye with enucleation and an artificial eye. Has been wearing a patch.  In the past she  had lumbar laminectomy for lateral recess stenosis ?With relief of pressure on the nerve roots however she had surgery at both the level of her consent as well as an adjacent level. She is experiencing neck pain as well with pain that radiates downward into the mid thorax. ?No upper extremity weakness. There is neck stiffness and decreased ROM. Has been treated for breast ca. She notices feeling of off balance standing and walking. No MRI of the spine that is recent.  ? ?Review of Systems  ?Constitutional: Negative.   ?HENT: Negative.    ?Eyes: Negative.   ?Respiratory: Negative.    ?Cardiovascular: Negative.   ?Gastrointestinal: Negative.   ?Endocrine: Negative.   ?Genitourinary: Negative.   ?Musculoskeletal: Negative.   ?Skin: Negative.   ?Allergic/Immunologic: Negative.   ?Neurological: Negative.   ?Hematological: Negative.   ?Psychiatric/Behavioral: Negative.    ? ? ?Objective: ?Vital Signs: BP (!) 151/77   Pulse (!) 102   Ht '5\' 7"'$  (1.702 m)   Wt 213 lb (96.6 kg)   BMI 33.36 kg/m?  ? ?Physical Exam ?Constitutional:   ?   Appearance: She is well-developed.  ?HENT:  ?   Head: Normocephalic and atraumatic.  ?Eyes:  ?   Pupils: Pupils are equal, round, and reactive to light.  ?  Pulmonary:  ?   Effort: Pulmonary effort is normal.  ?   Breath sounds: Normal breath sounds.  ?Abdominal:  ?   General: Bowel sounds are normal.  ?   Palpations: Abdomen is soft.  ?Musculoskeletal:  ?   Cervical back: Normal range of motion and neck supple.  ?   Lumbar back: Negative right straight leg raise test and negative left straight leg raise test.  ?Skin: ?   General: Skin is warm and dry.  ?Neurological:  ?   Mental Status: She is alert and oriented to person, place, and time.  ?Psychiatric:     ?   Behavior: Behavior normal.     ?   Thought Content: Thought content normal.     ?   Judgment: Judgment normal.  ? ? ?Back Exam  ? ?Tenderness  ?The patient is experiencing tenderness in the lumbar. ? ?Range of Motion  ?Extension:   abnormal  ?Flexion:  abnormal  ?Lateral bend right:  abnormal  ?Lateral bend left:  abnormal  ?Rotation right:  abnormal  ?Rotation left:  abnormal  ? ?Muscle Strength  ?Right Quadriceps:  5/5  ?Left Quadriceps:  5/5  ?Right Hamstrings:  5/5  ?Left Hamstrings:  5/5  ? ?Tests  ?Straight leg raise right: negative ?Straight leg raise left: negative ? ?Reflexes  ?Patellar:  2/4 ?Achilles:  2/4 ? ?Other  ?Toe walk: abnormal ?Heel walk: abnormal ?Sensation: normal ?Gait: normal  ?Erythema: no back redness ?Scars: absent ? ? ? ? ?Specialty Comments:  ?No specialty comments available. ? ?Imaging: ?No results found. ? ? ?PMFS History: ?Patient Active Problem List  ? Diagnosis Date Noted  ? Spinal stenosis, 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  ?  Priority: High  ?  Class: Chronic  ? Left shoulder tendonitis 05/23/2016  ?  Priority: High  ?  Class: Acute  ? Snoring 07/14/2021  ? Fatigue 07/14/2021  ? Daytime somnolence 07/14/2021  ? Status post device closure of ASD 06/03/2021  ? Secundum ASD 06/03/2021  ? Decreased diffusion capacity 05/14/2021  ? Shortness of breath 03/04/2021  ? Restrictive lung disease 04/01/2020  ? Pulmonary nodules 04/01/2020  ? Elevated LFTs 03/26/2020  ? Primary malignant neoplasm of breast with metastasis ([place]) 02/24/2020  ? Spondylosis without myelopathy or radiculopathy, lumbosacral region 12/31/2019  ? Chronic low back pain (Bilateral) w/o sciatica 12/31/2019  ? Chronic hip pain (3ry area of Pain) (Bilateral) (R>L) 11/27/2019  ? Chronic sacroiliac joint pain (Left) 11/27/2019  ? Chronic pain syndrome 11/11/2019  ? Pharmacologic therapy 11/11/2019  ? Disorder of skeletal system 11/11/2019  ? Problems influencing health status 11/11/2019  ? Abnormal MRI, lumbar spine (08/22/2018) 11/11/2019  ? Chronic low back pain (1ry area of Pain) (Bilateral) w/ sciatica (Bilateral) 11/11/2019  ? DDD (degenerative disc disease), lumbosacral 11/11/2019  ? Grade 1 Anterolisthesis (L2-3, L3-4)  11/11/2019  ? Grade 1 Retrolisthesis (L4-5, L5-S1) 11/11/2019  ? Lumbar facet hypertrophy (Multilevel) (Bilateral) 11/11/2019  ? Lumbar facet syndrome (Multilevel) (Bilateral) (R>L) 11/11/2019  ? Chronic lower extremity pain (2ry area of Pain) (Bilateral) (R>L) 11/11/2019  ? Neurogenic pain 11/11/2019  ? Chronic musculoskeletal pain 11/11/2019  ? Infiltrating ductal carcinoma of breast ([place]) 10/08/2019  ? Diarrhea 09/04/2019  ? Fatty liver 09/04/2019  ? Nausea & vomiting 09/04/2019  ? Obesity (BMI 30-39.9) 09/04/2019  ? GERD (gastroesophageal reflux disease)   ? Hyperlipidemia   ? CKD (chronic kidney disease), stage III ([place])   ? Depression   ? Malignant hypertension   ?  Transaminitis   ? Pneumonitis 08/04/2019  ? Breast cancer ([place]) 08/04/2019  ? Tachycardia   ? Ocular proptosis 02/14/2019  ? PVD (posterior vitreous detachment), left eye 02/14/2019  ? 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 Vitreous floaters of left eye 02/14/2019  ? Bilateral leg weakness 01/24/2019  ? Insomnia 08/02/2017  ? Low back pain radiating to both legs 08/02/2017  ? Migraine 08/02/2017  ? Neuropathy 08/02/2017  ? 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 Anxiety disorder 01/07/2015  ? ?Past Medical History:  ?Diagnosis Date  ? Acute pansinusitis 08/02/2017  ? Anxiety   ? Arthritis   ? ASD (atrial septal defect)   ? s/p closure with Amplatzer device 10/05/04 (Dr. Myriam Jacobson, [facility]) 10/05/04  ? Cancer [facility])   ? Cataract   ? Dyspnea   ? GERD (gastroesophageal reflux disease)   ? Heart murmur   ? no longer heard  ? History of hiatal hernia   ? Legally blind in right eye, as defined in [place]   ? Lumbar herniated disc   ? PONV (postoperative nausea and vomiting)   ? Sciatica   ?  ?Family History  ?Problem Relation Age of Onset  ? Arthritis Mother   ? Hypertension Mother   ? Diabetes Mother   ? High blood pressure Mother   ? Cancer Father   ?     Lung  ? Colon cancer Neg Hx   ? Colon polyps Neg Hx   ? Esophageal cancer Neg  Hx   ? Rectal cancer Neg Hx   ? Stomach cancer Neg Hx   ?  ?Past Surgical History:  ?Procedure Laterality Date  ? ABLATION    ? BREAST LUMPECTOMY WITH RADIOACTIVE SEED AND SENTINEL LYMPH NODE BIOPSY Right 6/20/202

## 2021-09-17 NOTE — Patient Instructions (Signed)
Avoid bending, stooping and avoid lifting weights greater than 10 lbs. ?Avoid prolong standing and walking. ?Avoid frequent bending and stooping  ?No lifting greater than 10 lbs. ?May use ice or moist heat for pain. ?Weight loss is of benefit. ?Handicap license is approved. ?MRI of the lumbar spine is ordered . ?

## 2021-09-20 ENCOUNTER — Ambulatory Visit: Payer: Medicare Other

## 2021-09-20 ENCOUNTER — Other Ambulatory Visit: Payer: Medicare Other

## 2021-09-21 ENCOUNTER — Encounter: Payer: Self-pay | Admitting: Cardiology

## 2021-09-23 NOTE — Telephone Encounter (Signed)
Spoke with patient and appointment scheduled for 4/21 with Dinah.  ?

## 2021-09-24 ENCOUNTER — Other Ambulatory Visit: Payer: Medicare Other

## 2021-09-24 ENCOUNTER — Encounter: Payer: Self-pay | Admitting: Family

## 2021-09-24 ENCOUNTER — Ambulatory Visit (INDEPENDENT_AMBULATORY_CARE_PROVIDER_SITE_OTHER): Payer: Medicare Other | Admitting: Family

## 2021-09-24 VITALS — BP 100/70 | HR 95 | Temp 97.5°F | Resp 18 | Ht 67.0 in | Wt 211.4 lb

## 2021-09-24 DIAGNOSIS — M5416 Radiculopathy, lumbar region: Secondary | ICD-10-CM

## 2021-09-24 DIAGNOSIS — N1831 Chronic kidney disease, stage 3a: Secondary | ICD-10-CM

## 2021-09-24 DIAGNOSIS — L853 Xerosis cutis: Secondary | ICD-10-CM | POA: Diagnosis not present

## 2021-09-24 DIAGNOSIS — H903 Sensorineural hearing loss, bilateral: Secondary | ICD-10-CM | POA: Diagnosis not present

## 2021-09-24 NOTE — Progress Notes (Signed)
? ?Provider: Marlowe Sax FNP-C ? ?Ngetich, Nelda Bucks, NP ? ?Patient Care Team: ?Ngetich, Nelda Bucks, NP as PCP - General (Family Medicine) ?Berniece Salines, DO as PCP - Cardiology (Cardiology) ?Everardo Beals, NP as Nurse Practitioner ? ?Extended Emergency Contact Information ?Primary Emergency Contact: [name],Charles ?Address: [address] ?         [place], [place] [address] [place] ?Home Phone: [phone] ?Relation: Spouse ?Secondary Emergency Contact: [place] ?Address: [address] ?         [place], [place] [address] [place] ?Home Phone: [phone] ?Relation: Daughter ? ?Code Status:  Full Code  ?Goals of care: Advanced Directive information ? ?  09/24/2021  ?  8:37 AM  ?Advanced Directives  ?Does Patient Have a Medical Advance Directive? No  ?Would patient like information on creating a medical advance directive? No - Patient declined  ? ? ? ?Chief Complaint  ?Patient presents with  ? Acute Visit  ?  [facility] due to kidney functions.   ? ? ?HPI:  ?Pt is a 56 y.o. female seen today for an acute visit for referral to Nephrology. States Orthopedic recommended referral to Nephrologist due to CKD.Has upcoming plans for lower back surgery. Her latest lab results CR 1.48 previous 1.36.Her liver enzymes were also elevated AST 72,ALT 97 and ALK phosphatase.states will schedule appointment with her GI  ?She complains of cramping on the thigh at night  ? ?Also request referral to dermatologist for evaluation of dry crack and painful skin on palms.Has used Eucerin,Aquaphor and cocoa butter but has been ineffective.  ? ?She complains of hard of hearing on both ears which has worsen.  ? ? ?Past Medical History:  ?Diagnosis Date  ? Acute pansinusitis 08/02/2017  ? Anxiety   ? Arthritis   ? ASD (atrial septal defect)   ? s/p closure with Amplatzer device 10/05/04 (Dr. Myriam Jacobson, [facility]) 10/05/04  ? Cancer [facility])   ? Cataract   ?  Dyspnea   ? GERD (gastroesophageal reflux disease)   ? Heart murmur   ? no longer heard  ? History of hiatal hernia   ? Legally blind in right eye, as defined in [place]   ? Lumbar herniated disc   ? PONV (postoperative nausea and vomiting)   ? Sciatica   ? ?Past Surgical History:  ?Procedure Laterality Date  ? ABLATION    ? BREAST LUMPECTOMY WITH RADIOACTIVE SEED AND SENTINEL LYMPH NODE BIOPSY Right 11/23/2020  ? Procedure: RIGHT BREAST LUMPECTOMY WITH RADIOACTIVE SEED AND RIGHT AXILLARY SENTINEL LYMPH NODE BIOPSY;  Surgeon: Rolm Bookbinder, MD;  Location: [facility];  Service: General;  Laterality: Right;  ? BREAST SURGERY Bilateral 2011  ? Breast Reduction Surgery  ? BUNIONECTOMY    ? CARDIAC CATHETERIZATION    ? 10/05/04 [facility]): LM < 25%, otherwise normal coronaries. No pulmonary HTN, Mildly enlarged RV. Secundum ASD s/p closure.  ? CARDIAC SURGERY    ? CATARACT EXTRACTION    ? CLEFT PALATE REPAIR    ? s/p cleft lip and palate repair  ? EYE SURGERY Right 2019  ? right eye removed  ? LUMBAR LAMINECTOMY/DECOMPRESSION MICRODISCECTOMY Left 05/23/2016  ? Procedure: LEFT L4-L5 LATERAL RECESS DECOMPRESSION WITH CENTRAL AND RIGHT DECOMPRESSION VIA LEFT SIDE;  Surgeon: [name], MD;  Location: [facility];  Service: Orthopedics;  Laterality: Left;  ? LUMBAR LAMINECTOMY/DECOMPRESSION MICRODISCECTOMY Left 05/23/2016  ? Procedure: LUMBAR LAMINECTOMY/DECOMPRESSION MICRODISCECTOMY Lumbar five - Sacral One 1 LEVEL;  Surgeon: [name], MD;  Location: [facility];  Service: Orthopedics;  Laterality: Left;  ? REDUCTION MAMMAPLASTY  2011  ? SHOULDER INJECTION Left 05/23/2016  ? Procedure: SHOULDER INJECTION;  Surgeon: [name], MD;  Location: [facility];  Service: Orthopedics;  Laterality: Left;  band-aid per pa-c  ? TRANSTHORACIC ECHOCARDIOGRAM    ? 12/15/05 [facility]): Mild LVH, EF > 17%, grade 1 diastolic dysfunction, Trivial MR/PR/TR.  ? TUBAL LIGATION    ? ? ?Allergies  ?Allergen Reactions  ? Zithromax [Azithromycin] Shortness Of Breath and  Itching  ?  TOTAL BODY ITCHING [EVEN SOLES OF [place] ?WHEEZING ?  ? Other   ?  z -pack  ? Psyllium Nausea And Vomiting  ?  Metamucil ?  ? ? ?Outpatient Encounter Medications as of 09/24/2021  ?Medication Sig  ? abemaciclib (VERZENIO) 100 MG tablet Take 1 tablet (100 mg total) by mouth 2 (two) times daily. Swallow tablets whole. Do not chew, crush, or split tablets before swallowing.  ? acetaminophen (TYLENOL) 500 MG tablet Take 500-1,000 mg by mouth every 6 (six) hours as needed for moderate pain or headache.  ? albuterol (PROVENTIL) (2.5 MG/3ML) 0.083% nebulizer solution Take 3 mLs (2.5 mg total) by nebulization every 6 (six) hours as needed for wheezing or shortness of breath.  ? ALPRAZolam (XANAX) 1 MG tablet Take 1 tablet (1 mg total) by mouth at bedtime.  ? amitriptyline (ELAVIL) 75 MG tablet Take 1 tablet (75 mg total) by mouth at bedtime.  ? Carboxymethylcellul-Glycerin (LUBRICATING EYE DROPS OP) Place 1 drop into the left eye 4 (four) times daily.  ? Cyanocobalamin (B-12 PO) Take 1 tablet by mouth daily.  ? diphenoxylate-atropine (LOMOTIL) 2.5-0.025 MG tablet Take 1 tablet by mouth 3 (three) times daily as needed for diarrhea or loose stools.  ? HYDROcodone-acetaminophen (NORCO/VICODIN) 5-325 MG tablet Take 1 tablet by mouth every 6 (six) hours as needed.  ? loperamide (IMODIUM) 2 MG capsule Take 1-2 capsules (2-4 mg total) by mouth 4 (four) times daily as needed for diarrhea or loose stools.  ? magnesium oxide (MAG-OX) 400 MG tablet Take 1 tablet (400 mg total) by mouth 2 (two) times daily.  ? midodrine (PROAMATINE) 5 MG tablet Take 1 tablet (5 mg total) by mouth every 8 (eight) hours.  ? Multiple Vitamin (MULTIVITAMIN WITH MINERALS) TABS tablet Take 1 tablet by mouth daily.  ? omeprazole (PRILOSEC OTC) 20 MG tablet Take 20 mg by mouth daily.  ? ondansetron (ZOFRAN) 8 MG tablet TAKE 1 TABLET(8 MG) BY MOUTH EVERY 8 HOURS AS NEEDED FOR NAUSEA OR VOMITING  ? rosuvastatin (CRESTOR) 10 MG tablet Take 1 tablet (10  mg total) by mouth daily.  ? Vitamin D, Ergocalciferol, (DRISDOL) 1.25 MG (50000 UNIT) CAPS capsule TAKE 1 CAPSULE ONCE PER WEEK  ? White Petrolatum-Mineral Oil (LUBRICANT EYE NIGHTTIME) OINT Place 1 drop into the left eye at bedtime.  ? [DISCONTINUED] carbamide peroxide (DEBROX) 6.5 % OTIC solution Place 5 drops into the right ear 2 (two) times daily.  ? [DISCONTINUED] fluticasone-salmeterol (ADVAIR) 250-50 MCG/ACT AEPB Inhale 1 puff into the lungs every 12 (twelve) hours.  ? [DISCONTINUED] methylPREDNISolone (MEDROL DOSEPAK) 4 MG TBPK tablet Take as directed 6 day dose pak  ? ?No facility-administered encounter medications on file as of 09/24/2021.  ? ? ?Review of Systems  ?Constitutional:  Negative for appetite change, chills, fatigue, fever and unexpected weight change.  ?HENT:  Negative for congestion, dental problem, ear discharge, ear pain, facial swelling, hearing loss, nosebleeds,  postnasal drip, rhinorrhea, sinus pressure, sinus pain, sneezing, sore throat, tinnitus and trouble swallowing.   ?Eyes:  Positive for visual disturbance. Negative for pain, discharge, redness and itching.  ?     Right eye blindness   ?Respiratory:  Negative for cough, chest tightness, shortness of breath and wheezing.   ?Cardiovascular:  Negative for chest pain, palpitations and leg swelling.  ?Gastrointestinal:  Negative for abdominal distention, abdominal pain, blood in stool, constipation, diarrhea, nausea and vomiting.  ?Genitourinary:  Negative for difficulty urinating, dysuria, flank pain and urgency.  ?     Does not empty bladder completely has appointment with urology  ?Musculoskeletal:  Positive for arthralgias and back pain. Negative for gait problem, joint swelling, myalgias, neck pain and neck stiffness.  ?Skin:  Negative for color change, pallor, rash and wound.  ?Neurological:  Positive for numbness. Negative for dizziness, syncope, speech difficulty, weakness, light-headedness and headaches.  ?     Neuropathy    ?Hematological:  Does not bruise/bleed easily.  ?Psychiatric/Behavioral:  Negative for agitation, behavioral problems, confusion, hallucinations and sleep disturbance. The patient is not nervous/anxious.   ?

## 2021-09-26 ENCOUNTER — Other Ambulatory Visit: Payer: Self-pay | Admitting: Hematology

## 2021-09-26 DIAGNOSIS — C50919 Malignant neoplasm of unspecified site of unspecified female breast: Secondary | ICD-10-CM

## 2021-09-27 ENCOUNTER — Encounter: Payer: Self-pay | Admitting: Hematology

## 2021-09-29 ENCOUNTER — Ambulatory Visit
Admission: RE | Admit: 2021-09-29 | Discharge: 2021-09-29 | Disposition: A | Discharge status: Home / Self Care | Payer: Medicare Other | Source: Ambulatory Visit | Attending: Specialist | Admitting: Specialist

## 2021-09-29 ENCOUNTER — Other Ambulatory Visit: Payer: Self-pay | Admitting: Specialist

## 2021-09-29 ENCOUNTER — Encounter: Payer: Self-pay | Admitting: Hematology

## 2021-09-29 ENCOUNTER — Other Ambulatory Visit: Payer: Self-pay

## 2021-09-29 DIAGNOSIS — R200 Anesthesia of skin: Secondary | ICD-10-CM | POA: Diagnosis not present

## 2021-09-29 DIAGNOSIS — R296 Repeated falls: Secondary | ICD-10-CM

## 2021-09-29 DIAGNOSIS — M4310 Spondylolisthesis, site unspecified: Secondary | ICD-10-CM

## 2021-09-29 DIAGNOSIS — M545 Low back pain, unspecified: Secondary | ICD-10-CM | POA: Diagnosis not present

## 2021-09-29 DIAGNOSIS — C801 Malignant (primary) neoplasm, unspecified: Secondary | ICD-10-CM

## 2021-09-29 DIAGNOSIS — M4316 Spondylolisthesis, lumbar region: Secondary | ICD-10-CM

## 2021-09-29 DIAGNOSIS — M48061 Spinal stenosis, lumbar region without neurogenic claudication: Secondary | ICD-10-CM | POA: Diagnosis not present

## 2021-09-29 MED ORDER — HYDROCODONE-ACETAMINOPHEN 5-325 MG PO TABS: 1.0000 | ORAL_TABLET | Freq: Four times a day (QID) | ORAL | 0 refills | Status: DC | PRN

## 2021-09-30 ENCOUNTER — Encounter: Payer: Self-pay | Admitting: Hematology

## 2021-09-30 ENCOUNTER — Other Ambulatory Visit: Payer: Self-pay | Admitting: Specialist

## 2021-10-06 ENCOUNTER — Other Ambulatory Visit: Payer: Self-pay | Admitting: Family

## 2021-10-06 DIAGNOSIS — E782 Mixed hyperlipidemia: Secondary | ICD-10-CM

## 2021-10-06 DIAGNOSIS — I10 Essential (primary) hypertension: Secondary | ICD-10-CM

## 2021-10-06 DIAGNOSIS — E538 Deficiency of other specified B group vitamins: Secondary | ICD-10-CM

## 2021-10-08 ENCOUNTER — Other Ambulatory Visit: Payer: Self-pay

## 2021-10-08 ENCOUNTER — Ambulatory Visit: Payer: Medicare Other | Admitting: Specialist

## 2021-10-08 ENCOUNTER — Encounter: Payer: Self-pay | Admitting: Specialist

## 2021-10-08 VITALS — BP 139/83 | HR 77 | Ht 66.5 in | Wt 210.0 lb

## 2021-10-08 DIAGNOSIS — M5136 Other intervertebral disc degeneration, lumbar region: Secondary | ICD-10-CM

## 2021-10-08 DIAGNOSIS — M4316 Spondylolisthesis, lumbar region: Secondary | ICD-10-CM | POA: Diagnosis not present

## 2021-10-08 DIAGNOSIS — C50919 Malignant neoplasm of unspecified site of unspecified female breast: Secondary | ICD-10-CM

## 2021-10-08 DIAGNOSIS — M47812 Spondylosis without myelopathy or radiculopathy, cervical region: Secondary | ICD-10-CM

## 2021-10-08 DIAGNOSIS — M7138 Other bursal cyst, other site: Secondary | ICD-10-CM | POA: Diagnosis not present

## 2021-10-08 DIAGNOSIS — M4310 Spondylolisthesis, site unspecified: Secondary | ICD-10-CM | POA: Diagnosis not present

## 2021-10-08 DIAGNOSIS — M4712 Other spondylosis with myelopathy, cervical region: Secondary | ICD-10-CM | POA: Diagnosis not present

## 2021-10-08 MED ORDER — DULOXETINE HCL 20 MG PO CPEP: 20.0000 mg | ORAL_CAPSULE | Freq: Every day | ORAL | 3 refills | Status: DC

## 2021-10-08 NOTE — Patient Instructions (Signed)
Avoid bending, stooping and avoid lifting weights greater than 10 lbs. ?Avoid prolong standing and walking. ?Avoid frequent bending and stooping  ?No lifting greater than 10 lbs. ?May use ice or moist heat for pain. ?Weight loss is of benefit. ?Handicap license is approved. ?Dr. Romona Curls secretary/Assistant will call to arrange for joint blocks for arthritis changes in the lumbar spine on the left side then 2 weeks later on the right side. ?Start cymbalta (duloxitine) 20 mg at night ?Stop elavil (amitryptyline) it is similar to duloxitine but not approved for treatment of arthritis.  ?

## 2021-10-08 NOTE — Progress Notes (Signed)
? ?Office Visit Note ?  ?Patient: Brandy [name]           ?Date of Birth: [birth year]           ?MRN: [id number] ?Visit Date: 10/08/2021 ?             ?Requested by: Sandrea Hughs, NP ?[address] ?[place],  [place] [address] ?PCP: Ngetich, Nelda Bucks, NP ? ? ?Assessment & Plan: ?Visit Diagnoses:  ?1. Spondylolisthesis of lumbar region   ?2. Grade 1 Anterolisthesis (L2-3, L3-4)   ?3. Grade 1 Retrolisthesis (L4-5, L5-S1)   ?4. Other spondylosis with myelopathy, cervical region   ?5. Degenerative disc disease, lumbar   ?6. Synovial cyst of lumbar spine   ? ? ?Plan: Avoid bending, stooping and avoid lifting weights greater than 10 lbs. ?Avoid prolong standing and walking. ?Avoid frequent bending and stooping  ?No lifting greater than 10 lbs. ?May use ice or moist heat for pain. ?Weight loss is of benefit. ?Handicap license is approved. ?Dr. Romona Curls secretary/Assistant will call to arrange for joint blocks for arthritis changes in the lumbar spine on the left side then 2 weeks later on the right side. ?Start cymbalta (duloxitine) 20 mg at night ?Stop elavil (amitryptyline) it is similar to duloxitine but not approved for treatment of arthritis.  ? ?Follow-Up Instructions: No follow-ups on file.  ? ?Orders:  ?No orders of the defined types were placed in this encounter. ? ?No orders of the defined types were placed in this encounter. ? ? ? ? Procedures: ?No procedures performed ? ? ?Clinical Data: ?No additional findings. ? ? ?Subjective: ?Chief Complaint  ?Patient presents with  ? Lower Back - Pain  ? ? ?HPI ? ?Review of Systems ? ? ?Objective: ?Vital Signs: BP 139/83   Pulse 77   Ht 5' 6.5" (1.689 m)   Wt 210 lb (95.3 kg)   BMI 33.39 kg/m?  ? ?Physical Exam ? ?Ortho Exam ? ?Specialty Comments:  ?No specialty comments available. ? ?Imaging: ?No results found. ? ? ?PMFS History: ?Patient Active Problem List  ? Diagnosis Date Noted  ? Spinal stenosis, 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  ?   Priority: High  ?  Class: Chronic  ? Left shoulder tendonitis 05/23/2016  ?  Priority: High  ?  Class: Acute  ? Snoring 07/14/2021  ? Fatigue 07/14/2021  ? Daytime somnolence 07/14/2021  ? Status post device closure of ASD 06/03/2021  ? Secundum ASD 06/03/2021  ? Decreased diffusion capacity 05/14/2021  ? Shortness of breath 03/04/2021  ? Restrictive lung disease 04/01/2020  ? Pulmonary nodules 04/01/2020  ? Elevated LFTs 03/26/2020  ? Primary malignant neoplasm of breast with metastasis ([place]) 02/24/2020  ? Spondylosis without myelopathy or radiculopathy, lumbosacral region 12/31/2019  ? Chronic low back pain (Bilateral) w/o sciatica 12/31/2019  ? Chronic hip pain (3ry area of Pain) (Bilateral) (R>L) 11/27/2019  ? Chronic sacroiliac joint pain (Left) 11/27/2019  ? Chronic pain syndrome 11/11/2019  ? Pharmacologic therapy 11/11/2019  ? Disorder of skeletal system 11/11/2019  ? Problems influencing health status 11/11/2019  ? Abnormal MRI, lumbar spine (08/22/2018) 11/11/2019  ? Chronic low back pain (1ry area of Pain) (Bilateral) w/ sciatica (Bilateral) 11/11/2019  ? DDD (degenerative disc disease), lumbosacral 11/11/2019  ? Grade 1 Anterolisthesis (L2-3, L3-4) 11/11/2019  ? Grade 1 Retrolisthesis (L4-5, L5-S1) 11/11/2019  ? Lumbar facet hypertrophy (Multilevel) (Bilateral) 11/11/2019  ? Lumbar facet syndrome (Multilevel) (Bilateral) (R>L) 11/11/2019  ? Chronic lower extremity pain (2ry  area of Pain) (Bilateral) (R>L) 11/11/2019  ? Neurogenic pain 11/11/2019  ? Chronic musculoskeletal pain 11/11/2019  ? Infiltrating ductal carcinoma of breast ([place]) 10/08/2019  ? Diarrhea 09/04/2019  ? Fatty liver 09/04/2019  ? Nausea & vomiting 09/04/2019  ? Obesity (BMI 30-39.9) 09/04/2019  ? GERD (gastroesophageal reflux disease)   ? Hyperlipidemia   ? CKD (chronic kidney disease), stage III ([place])   ? Depression   ? Malignant hypertension   ? Transaminitis   ? Pneumonitis 08/04/2019  ? Breast cancer ([place]) 08/04/2019  ? Tachycardia    ? Ocular proptosis 02/14/2019  ? PVD (posterior vitreous detachment), left eye 02/14/2019  ? 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 Vitreous floaters of left eye 02/14/2019  ? Bilateral leg weakness 01/24/2019  ? Insomnia 08/02/2017  ? Low back pain radiating to both legs 08/02/2017  ? Migraine 08/02/2017  ? Neuropathy 08/02/2017  ? 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 Anxiety disorder 01/07/2015  ? ?Past Medical History:  ?Diagnosis Date  ? Acute pansinusitis 08/02/2017  ? Anxiety   ? Arthritis   ? ASD (atrial septal defect)   ? s/p closure with Amplatzer device 10/05/04 (Dr. Myriam Jacobson, [facility]) 10/05/04  ? Cancer [facility])   ? Cataract   ? Dyspnea   ? GERD (gastroesophageal reflux disease)   ? Heart murmur   ? no longer heard  ? History of hiatal hernia   ? Legally blind in right eye, as defined in [place]   ? Lumbar herniated disc   ? PONV (postoperative nausea and vomiting)   ? Sciatica   ?  ?Family History  ?Problem Relation Age of Onset  ? Arthritis Mother   ? Hypertension Mother   ? Diabetes Mother   ? High blood pressure Mother   ? Cancer Father   ?     Lung  ? Colon cancer Neg Hx   ? Colon polyps Neg Hx   ? Esophageal cancer Neg Hx   ? Rectal cancer Neg Hx   ? Stomach cancer Neg Hx   ?  ?Past Surgical History:  ?Procedure Laterality Date  ? ABLATION    ? BREAST LUMPECTOMY WITH RADIOACTIVE SEED AND SENTINEL LYMPH NODE BIOPSY Right 11/23/2020  ? Procedure: RIGHT BREAST LUMPECTOMY WITH RADIOACTIVE SEED AND RIGHT AXILLARY SENTINEL LYMPH NODE BIOPSY;  Surgeon: Rolm Bookbinder, MD;  Location: [place];  Service: General;  Laterality: Right;  ? BREAST SURGERY Bilateral 2011  ? Breast Reduction Surgery  ? BUNIONECTOMY    ? CARDIAC CATHETERIZATION    ? 10/05/04 [facility]): LM < 25%, otherwise normal coronaries. No pulmonary HTN, Mildly enlarged RV. Secundum ASD s/p closure.  ? CARDIAC SURGERY    ? CATARACT EXTRACTION    ? CLEFT PALATE REPAIR    ? s/p cleft lip and palate repair  ? EYE SURGERY Right  2019  ? right eye removed  ? LUMBAR LAMINECTOMY/DECOMPRESSION MICRODISCECTOMY Left 05/23/2016  ? Procedure: LEFT L4-L5 LATERAL RECESS DECOMPRESSION WITH CENTRAL AND RIGHT DECOMPRESSION VIA LEFT SIDE;  Surgeon: [name], MD;  Location: [place];  Service: Orthopedics;  Laterality: Left;  ? LUMBAR LAMINECTOMY/DECOMPRESSION MICRODISCECTOMY Left 05/23/2016  ? Procedure: LUMBAR LAMINECTOMY/DECOMPRESSION MICRODISCECTOMY Lumbar five - Sacral One 1 LEVEL;  Surgeon: [name], MD;  Location: [place];  Service: Orthopedics;  Laterality: Left;  ? REDUCTION MAMMAPLASTY  2011  ? SHOULDER INJECTION Left 05/23/2016  ? Procedure: SHOULDER INJECTION;  Surgeon: [name], MD;  Location: [place];  Service: Orthopedics;  Laterality: Left;  band-aid per pa-c  ? TRANSTHORACIC ECHOCARDIOGRAM    ? 12/15/05 [facility]): Mild LVH, EF > 86%, grade 1 diastolic dysfunction, Trivial MR/PR/TR.  ? TUBAL LIGATION    ? ?Social History  ? ?Occupational History  ? Not on file  ?Tobacco Use  ? Smoking status: Former  ?  Packs/day: 0.25  ?  Years: 25.00  ?  Pack years: 6.25  ?  Types: Cigarettes  ?  Quit date: 02/04/2018  ?  Years since quitting: 3.6  ? Smokeless tobacco: Never  ?Vaping Use  ? Vaping Use: Never used  ?Substance and Sexual Activity  ? Alcohol use: No  ? Drug use: No  ? Sexual activity: Not on file  ? ? ? ? ? ? ?

## 2021-10-08 NOTE — Progress Notes (Signed)
Low back pain- had MRI lumbar spine done last week. Wants to go over results and discuss further treatment.  ?

## 2021-10-11 ENCOUNTER — Ambulatory Visit: Payer: Medicare Other

## 2021-10-11 ENCOUNTER — Inpatient Hospital Stay: Payer: Medicare Other | Admitting: Hematology

## 2021-10-11 ENCOUNTER — Other Ambulatory Visit: Payer: Medicare Other

## 2021-10-11 ENCOUNTER — Inpatient Hospital Stay: Payer: Medicare Other | Attending: Hematology

## 2021-10-11 ENCOUNTER — Inpatient Hospital Stay: Payer: Medicare Other

## 2021-10-11 DIAGNOSIS — Z87891 Personal history of nicotine dependence: Secondary | ICD-10-CM | POA: Insufficient documentation

## 2021-10-11 DIAGNOSIS — C7800 Secondary malignant neoplasm of unspecified lung: Secondary | ICD-10-CM | POA: Insufficient documentation

## 2021-10-11 DIAGNOSIS — Z8249 Family history of ischemic heart disease and other diseases of the circulatory system: Secondary | ICD-10-CM | POA: Insufficient documentation

## 2021-10-11 DIAGNOSIS — M4802 Spinal stenosis, cervical region: Secondary | ICD-10-CM | POA: Insufficient documentation

## 2021-10-11 DIAGNOSIS — C50511 Malignant neoplasm of lower-outer quadrant of right female breast: Secondary | ICD-10-CM | POA: Insufficient documentation

## 2021-10-11 DIAGNOSIS — K760 Fatty (change of) liver, not elsewhere classified: Secondary | ICD-10-CM | POA: Insufficient documentation

## 2021-10-11 DIAGNOSIS — Z79899 Other long term (current) drug therapy: Secondary | ICD-10-CM | POA: Insufficient documentation

## 2021-10-11 DIAGNOSIS — Z5111 Encounter for antineoplastic chemotherapy: Secondary | ICD-10-CM | POA: Insufficient documentation

## 2021-10-11 DIAGNOSIS — M5031 Other cervical disc degeneration,  high cervical region: Secondary | ICD-10-CM | POA: Insufficient documentation

## 2021-10-11 DIAGNOSIS — R296 Repeated falls: Secondary | ICD-10-CM | POA: Insufficient documentation

## 2021-10-11 DIAGNOSIS — R7989 Other specified abnormal findings of blood chemistry: Secondary | ICD-10-CM | POA: Insufficient documentation

## 2021-10-11 DIAGNOSIS — Z801 Family history of malignant neoplasm of trachea, bronchus and lung: Secondary | ICD-10-CM | POA: Insufficient documentation

## 2021-10-11 DIAGNOSIS — Z170 Estrogen receptor positive status [ER+]: Secondary | ICD-10-CM | POA: Insufficient documentation

## 2021-10-11 DIAGNOSIS — Z8261 Family history of arthritis: Secondary | ICD-10-CM | POA: Insufficient documentation

## 2021-10-11 DIAGNOSIS — Z833 Family history of diabetes mellitus: Secondary | ICD-10-CM | POA: Insufficient documentation

## 2021-10-11 DIAGNOSIS — K219 Gastro-esophageal reflux disease without esophagitis: Secondary | ICD-10-CM | POA: Insufficient documentation

## 2021-10-11 DIAGNOSIS — R5383 Other fatigue: Secondary | ICD-10-CM | POA: Insufficient documentation

## 2021-10-11 DIAGNOSIS — I700 Atherosclerosis of aorta: Secondary | ICD-10-CM | POA: Insufficient documentation

## 2021-10-13 ENCOUNTER — Telehealth: Payer: Self-pay

## 2021-10-13 NOTE — Telephone Encounter (Signed)
Patient called stating that she had a bad fall on Sunday and hurt the right side of her back and both knees and legs.  Would like to know what else she can take for pain or if a Rx could be sent in to her pharmacy.  Stated that she has been taking Tylenol and using cold/hot compress. Not able to take Naproxen as well.  Cb# [phone].  Please advise.  Thank you. ?

## 2021-10-14 ENCOUNTER — Telehealth: Payer: Self-pay | Admitting: Specialist

## 2021-10-14 DIAGNOSIS — L258 Unspecified contact dermatitis due to other agents: Secondary | ICD-10-CM | POA: Diagnosis not present

## 2021-10-14 DIAGNOSIS — L302 Cutaneous autosensitization: Secondary | ICD-10-CM | POA: Diagnosis not present

## 2021-10-14 DIAGNOSIS — L648 Other androgenic alopecia: Secondary | ICD-10-CM | POA: Diagnosis not present

## 2021-10-14 NOTE — Telephone Encounter (Signed)
Printed for Dr. Louanne Skye ? ?

## 2021-10-14 NOTE — Telephone Encounter (Signed)
Patient is still in pain and would like to know what she can take for it being that she is not able to take Naproxen Stated that she has been taking Tylenol and using cold/hot compress. But is still in pain please advise. ?

## 2021-10-15 ENCOUNTER — Encounter: Payer: Self-pay | Admitting: Specialist

## 2021-10-15 NOTE — Telephone Encounter (Signed)
Waiting for Dr. Louanne Skye to respond to previous message ?

## 2021-10-15 NOTE — Telephone Encounter (Signed)
See other message, per Dr. Louanne Skye she needs to be evaluated by someone prior to any pain meds being given ?

## 2021-10-18 ENCOUNTER — Ambulatory Visit: Payer: Medicare Other | Admitting: Cardiology

## 2021-10-18 ENCOUNTER — Other Ambulatory Visit: Payer: Medicare Other

## 2021-10-18 ENCOUNTER — Ambulatory Visit: Payer: Medicare Other

## 2021-10-19 ENCOUNTER — Inpatient Hospital Stay: Payer: Medicare Other

## 2021-10-19 ENCOUNTER — Ambulatory Visit (INDEPENDENT_AMBULATORY_CARE_PROVIDER_SITE_OTHER): Payer: Medicare Other

## 2021-10-19 ENCOUNTER — Ambulatory Visit: Payer: Medicare Other | Admitting: Physician Assistant

## 2021-10-19 ENCOUNTER — Other Ambulatory Visit: Payer: Self-pay | Admitting: Hematology

## 2021-10-19 ENCOUNTER — Encounter: Payer: Self-pay | Admitting: Physician Assistant

## 2021-10-19 VITALS — BP 140/74 | HR 86 | Temp 98.7°F | Resp 18

## 2021-10-19 DIAGNOSIS — C50919 Malignant neoplasm of unspecified site of unspecified female breast: Secondary | ICD-10-CM

## 2021-10-19 DIAGNOSIS — M4802 Spinal stenosis, cervical region: Secondary | ICD-10-CM | POA: Diagnosis not present

## 2021-10-19 DIAGNOSIS — Z87891 Personal history of nicotine dependence: Secondary | ICD-10-CM | POA: Diagnosis not present

## 2021-10-19 DIAGNOSIS — M545 Low back pain, unspecified: Secondary | ICD-10-CM

## 2021-10-19 DIAGNOSIS — R7989 Other specified abnormal findings of blood chemistry: Secondary | ICD-10-CM | POA: Diagnosis not present

## 2021-10-19 DIAGNOSIS — Z833 Family history of diabetes mellitus: Secondary | ICD-10-CM | POA: Diagnosis not present

## 2021-10-19 DIAGNOSIS — M5031 Other cervical disc degeneration,  high cervical region: Secondary | ICD-10-CM | POA: Diagnosis not present

## 2021-10-19 DIAGNOSIS — C50511 Malignant neoplasm of lower-outer quadrant of right female breast: Secondary | ICD-10-CM | POA: Diagnosis not present

## 2021-10-19 DIAGNOSIS — Z79899 Other long term (current) drug therapy: Secondary | ICD-10-CM | POA: Diagnosis not present

## 2021-10-19 DIAGNOSIS — Z170 Estrogen receptor positive status [ER+]: Secondary | ICD-10-CM | POA: Diagnosis not present

## 2021-10-19 DIAGNOSIS — R296 Repeated falls: Secondary | ICD-10-CM | POA: Diagnosis not present

## 2021-10-19 DIAGNOSIS — R0781 Pleurodynia: Secondary | ICD-10-CM | POA: Diagnosis not present

## 2021-10-19 DIAGNOSIS — Z5111 Encounter for antineoplastic chemotherapy: Secondary | ICD-10-CM | POA: Diagnosis not present

## 2021-10-19 DIAGNOSIS — Z8261 Family history of arthritis: Secondary | ICD-10-CM | POA: Diagnosis not present

## 2021-10-19 DIAGNOSIS — I700 Atherosclerosis of aorta: Secondary | ICD-10-CM | POA: Diagnosis not present

## 2021-10-19 DIAGNOSIS — R5383 Other fatigue: Secondary | ICD-10-CM | POA: Diagnosis not present

## 2021-10-19 DIAGNOSIS — K760 Fatty (change of) liver, not elsewhere classified: Secondary | ICD-10-CM | POA: Diagnosis not present

## 2021-10-19 DIAGNOSIS — C7800 Secondary malignant neoplasm of unspecified lung: Secondary | ICD-10-CM | POA: Diagnosis not present

## 2021-10-19 DIAGNOSIS — Z8249 Family history of ischemic heart disease and other diseases of the circulatory system: Secondary | ICD-10-CM | POA: Diagnosis not present

## 2021-10-19 DIAGNOSIS — Z801 Family history of malignant neoplasm of trachea, bronchus and lung: Secondary | ICD-10-CM | POA: Diagnosis not present

## 2021-10-19 DIAGNOSIS — K219 Gastro-esophageal reflux disease without esophagitis: Secondary | ICD-10-CM | POA: Diagnosis not present

## 2021-10-19 LAB — CBC WITH DIFFERENTIAL (CANCER CENTER ONLY)
Abs Immature Granulocytes: 0.01 10*3/uL (ref 0.00–0.07)
Basophils Absolute: 0.1 10*3/uL (ref 0.0–0.1)
Basophils Relative: 1 %
Eosinophils Absolute: 0.1 10*3/uL (ref 0.0–0.5)
Eosinophils Relative: 1 %
HCT: 35.5 % — ABNORMAL LOW (ref 36.0–46.0)
Hemoglobin: 11.9 g/dL — ABNORMAL LOW (ref 12.0–15.0)
Immature Granulocytes: 0 %
Lymphocytes Relative: 56 %
Lymphs Abs: 2.6 10*3/uL (ref 0.7–4.0)
MCH: 35.1 pg — ABNORMAL HIGH (ref 26.0–34.0)
MCHC: 33.5 g/dL (ref 30.0–36.0)
MCV: 104.7 fL — ABNORMAL HIGH (ref 80.0–100.0)
Monocytes Absolute: 0.3 10*3/uL (ref 0.1–1.0)
Monocytes Relative: 7 %
Neutro Abs: 1.6 10*3/uL — ABNORMAL LOW (ref 1.7–7.7)
Neutrophils Relative %: 35 %
Platelet Count: 244 10*3/uL (ref 150–400)
RBC: 3.39 MIL/uL — ABNORMAL LOW (ref 3.87–5.11)
RDW: 14.7 % (ref 11.5–15.5)
WBC Count: 4.7 10*3/uL (ref 4.0–10.5)
nRBC: 0 % (ref 0.0–0.2)

## 2021-10-19 LAB — CMP (CANCER CENTER ONLY)
ALT: 70 U/L — ABNORMAL HIGH (ref 0–44)
AST: 97 U/L — ABNORMAL HIGH (ref 15–41)
Albumin: 4 g/dL (ref 3.5–5.0)
Alkaline Phosphatase: 237 U/L — ABNORMAL HIGH (ref 38–126)
Anion gap: 6 (ref 5–15)
BUN: 11 mg/dL (ref 6–20)
CO2: 33 mmol/L — ABNORMAL HIGH (ref 22–32)
Calcium: 9.3 mg/dL (ref 8.9–10.3)
Chloride: 107 mmol/L (ref 98–111)
Creatinine: 1.66 mg/dL — ABNORMAL HIGH (ref 0.44–1.00)
GFR, Estimated: 36 mL/min — ABNORMAL LOW (ref >60)
Glucose, Bld: 111 mg/dL — ABNORMAL HIGH (ref 70–99)
Potassium: 3.8 mmol/L (ref 3.5–5.1)
Sodium: 146 mmol/L — ABNORMAL HIGH (ref 135–145)
Total Bilirubin: 0.6 mg/dL (ref 0.3–1.2)
Total Protein: 7.5 g/dL (ref 6.5–8.1)

## 2021-10-19 MED ORDER — FULVESTRANT 250 MG/5ML IM SOSY
500.0000 mg | PREFILLED_SYRINGE | Freq: Once | INTRAMUSCULAR | Status: AC
  Administered 2021-10-19: 500 mg via INTRAMUSCULAR
  Filled 2021-10-19: qty 10

## 2021-10-19 MED ORDER — METHOCARBAMOL 500 MG PO TABS: 500.0000 mg | ORAL_TABLET | Freq: Four times a day (QID) | ORAL | 0 refills | Status: DC | PRN

## 2021-10-19 MED ORDER — HYDROCODONE-ACETAMINOPHEN 5-325 MG PO TABS: 1.0000 | ORAL_TABLET | Freq: Four times a day (QID) | ORAL | 0 refills | Status: DC | PRN

## 2021-10-19 MED ORDER — ABEMACICLIB 100 MG PO TABS: 100.0000 mg | ORAL_TABLET | Freq: Two times a day (BID) | ORAL | 2 refills | Status: DC

## 2021-10-19 NOTE — Progress Notes (Signed)
? ?Office Visit Note ?  ?Patient: Brandy [name]           ?Date of Birth: 09-14-1965           ?MRN: [id number] ?Visit Date: 10/19/2021 ?             ?Requested by: Sandrea Hughs, NP ?[address] ?[place],  [place] [address] ?PCP: Ngetich, Nelda Bucks, NP ? ?Chief Complaint  ?Patient presents with  ? Lower Back - Follow-up  ? ? ? ? ?HPI: ?Patient is a pleasant 56 year old woman who comes in today with a chief complaint of acute lower back pain into her right buttock radiating down the right side of her leg.  She is also complaining of some lower rib pain on the right.  She denies any shortness of breath but she does say when she takes a deep breath it hurts in this area.  She is wearing a lumbar support which helps.  She has a long history of lower back issues.  She denies any loss of bowel or bladder control any weakness.  Denies any groin pai ? ?Assessment & Plan: ?Visit Diagnoses:  ?1. Low back pain, unspecified back pain laterality, unspecified chronicity, unspecified whether sciatica present   ?2. Rib pain on right side   ? ? ?Plan: X-rays were reassuring.  No change really in previous x-rays she has had of her lumbar spine.  I will call her in a muscle relaxant and just a very few Vicodin which she breaks in half.  She is only requesting 5.  She will continue to use her walker and would like to do physical therapy.  I will take care of this for her.  She does have an appointment with Dr. Ernestina Patches to get an injection.  I also recommend that she follow-up with Jeneen Rinks in a few weeks to make sure she is recovered from her most recent fall ? ?Follow-Up Instructions: No follow-ups on file.  ? ?Ortho Exam ? ?Patient is alert, oriented, no adenopathy, well-dressed, normal affect, normal respiratory effort. ?Examination she has 1 focal area of tenderness on her lower rib cage on the right.  She is not short of breath but it does bother her when she takes a deep breath.  She feels this is muscle and it actually  feels better with a lumbar support examination of her lower extremity she has some tenderness to palpation in the posterior buttock.  Does produce some radiation down the lateral side of her leg.  Her strength is 5 out of 5 with dorsiflexion plantarflexion of her ankle extension and flexion of her knee and flexion of her hip ? ?Imaging: ?No results found. ?No images are attached to the encounter. ? ?Labs: ?Lab Results  ?Component Value Date  ? ESRSEDRATE 22 11/27/2019  ? ESRSEDRATE 9 06/27/2017  ? ESRSEDRATE 6 12/29/2016  ? CRP 16 (H) 11/27/2019  ? CRP 7.7 (H) 08/04/2019  ? LABURIC 6.5 12/29/2016  ? REPTSTATUS 09/07/2019 FINAL 09/05/2019  ? CULT  09/05/2019  ?  NO GROWTH ?Performed at [facility], [place] [address] [place], [place] [address] ?  ? ? ? ?Lab Results  ?Component Value Date  ? ALBUMIN 3.8 09/14/2021  ? ALBUMIN 3.8 08/16/2021  ? ALBUMIN 3.5 07/20/2021  ? ? ?Lab Results  ?Component Value Date  ? MG 1.5 (L) 06/14/2021  ? MG 1.5 (L) 11/01/2019  ? MG 1.3 (LL) 11/01/2019  ? ?Lab Results  ?Component Value Date  ?  VD25OH 30.51 06/26/2019  ? VD25OH 27.77 (L) 04/16/2019  ? VD25OH 23.4 (L) 02/22/2019  ? ? ?No results found for: PREALBUMIN ? ?  Latest Ref Rng & Units 09/14/2021  ?  9:23 AM 08/16/2021  ? 11:08 AM 07/28/2021  ?  9:16 AM  ?CBC EXTENDED  ?WBC 4.0 - 10.5 K/uL 4.7   4.7   5.0    ?RBC 3.87 - 5.11 MIL/uL 3.21   3.35   3.69    ?Hemoglobin 12.0 - 15.0 g/dL 11.3   11.7   12.7    ?HCT 36.0 - 46.0 % 33.5   34.6   37.1    ?Platelets 150 - 400 K/uL 219   237   253    ?NEUT# 1.7 - 7.7 K/uL 1.5   1.5   1.4    ?Lymph# 0.7 - 4.0 K/uL 2.8   2.9   3.2    ? ? ? ?There is no height or weight on file to calculate BMI. ? ?Orders:  ?Orders Placed This Encounter  ?Procedures  ? XR Lumbar Spine 2-3 Views  ? XR Ribs Unilateral Right  ? Ambulatory referral to Physical Therapy  ? ?No orders of the defined types were placed in this encounter. ? ? ? Procedures: ?No procedures performed ? ?Clinical Data: ?No additional  findings. ? ?ROS: ? ?All other systems negative, except as noted in the HPI. ?Review of Systems ? ?Objective: ?Vital Signs: There were no vitals taken for this visit. ? ?Specialty Comments:  ?No specialty comments available. ? ?PMFS History: ?Patient Active Problem List  ? Diagnosis Date Noted  ? Snoring 07/14/2021  ? Fatigue 07/14/2021  ? Daytime somnolence 07/14/2021  ? Status post device closure of ASD 06/03/2021  ? Secundum ASD 06/03/2021  ? Decreased diffusion capacity 05/14/2021  ? Shortness of breath 03/04/2021  ? Restrictive lung disease 04/01/2020  ? Pulmonary nodules 04/01/2020  ? Elevated LFTs 03/26/2020  ? Primary malignant neoplasm of breast with metastasis ([place]) 02/24/2020  ? Spondylosis without myelopathy or radiculopathy, lumbosacral region 12/31/2019  ? Chronic low back pain (Bilateral) w/o sciatica 12/31/2019  ? Chronic hip pain (3ry area of Pain) (Bilateral) (R>L) 11/27/2019  ? Chronic sacroiliac joint pain (Left) 11/27/2019  ? Chronic pain syndrome 11/11/2019  ? Pharmacologic therapy 11/11/2019  ? Disorder of skeletal system 11/11/2019  ? Problems influencing health status 11/11/2019  ? Abnormal MRI, lumbar spine (08/22/2018) 11/11/2019  ? Chronic low back pain (1ry area of Pain) (Bilateral) w/ sciatica (Bilateral) 11/11/2019  ? DDD (degenerative disc disease), lumbosacral 11/11/2019  ? Grade 1 Anterolisthesis (L2-3, L3-4) 11/11/2019  ? Grade 1 Retrolisthesis (L4-5, L5-S1) 11/11/2019  ? Lumbar facet hypertrophy (Multilevel) (Bilateral) 11/11/2019  ? Lumbar facet syndrome (Multilevel) (Bilateral) (R>L) 11/11/2019  ? Chronic lower extremity pain (2ry area of Pain) (Bilateral) (R>L) 11/11/2019  ? Neurogenic pain 11/11/2019  ? Chronic musculoskeletal pain 11/11/2019  ? Infiltrating ductal carcinoma of breast ([place]) 10/08/2019  ? Diarrhea 09/04/2019  ? Fatty liver 09/04/2019  ? Nausea & vomiting 09/04/2019  ? Obesity (BMI 30-39.9) 09/04/2019  ? GERD (gastroesophageal reflux disease)   ? Hyperlipidemia    ? CKD (chronic kidney disease), stage III ([place])   ? Depression   ? Malignant hypertension   ? Transaminitis   ? Pneumonitis 08/04/2019  ? Breast cancer ([place]) 08/04/2019  ? Tachycardia   ? Ocular proptosis 02/14/2019  ? PVD (posterior vitreous detachment), left eye 02/14/2019  ? 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 Vitreous floaters of  left eye 02/14/2019  ? Bilateral leg weakness 01/24/2019  ? Insomnia 08/02/2017  ? Low back pain radiating to both legs 08/02/2017  ? Migraine 08/02/2017  ? Neuropathy 08/02/2017  ? Spinal stenosis, 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  ?  Class: Chronic  ? Left shoulder tendonitis 05/23/2016  ?  Class: Acute  ? 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 Anxiety disorder 01/07/2015  ? ?Past Medical History:  ?Diagnosis Date  ? Acute pansinusitis 08/02/2017  ? Anxiety   ? Arthritis   ? ASD (atrial septal defect)   ? s/p closure with Amplatzer device 10/05/04 (Dr. Myriam Jacobson, [facility]) 10/05/04  ? Cancer [facility])   ? Cataract   ? Dyspnea   ? GERD (gastroesophageal reflux disease)   ? Heart murmur   ? no longer heard  ? History of hiatal hernia   ? Legally blind in right eye, as defined in [place]   ? Lumbar herniated disc   ? PONV (postoperative nausea and vomiting)   ? Sciatica   ?  ?Family History  ?Problem Relation Age of Onset  ? Arthritis Mother   ? Hypertension Mother   ? Diabetes Mother   ? High blood pressure Mother   ? Cancer Father   ?     Lung  ? Colon cancer Neg Hx   ? Colon polyps Neg Hx   ? Esophageal cancer Neg Hx   ? Rectal cancer Neg Hx   ? Stomach cancer Neg Hx   ?  ?Past Surgical History:  ?Procedure Laterality Date  ? ABLATION    ? BREAST LUMPECTOMY WITH RADIOACTIVE SEED AND SENTINEL LYMPH NODE BIOPSY Right 11/23/2020  ? Procedure: RIGHT BREAST LUMPECTOMY WITH RADIOACTIVE SEED AND RIGHT AXILLARY SENTINEL LYMPH NODE BIOPSY;  Surgeon: Rolm Bookbinder, MD;  Location: [place];  Service: General;  Laterality: Right;  ? BREAST SURGERY  Bilateral 2011  ? Breast Reduction Surgery  ? BUNIONECTOMY    ? CARDIAC CATHETERIZATION    ? 10/05/04 [facility]): LM < 25%, otherwise normal coronaries. No pulmonary HTN, Mildly enlarged RV. Secundum ASD s/p closure.  ? C

## 2021-10-20 ENCOUNTER — Other Ambulatory Visit: Payer: Self-pay | Admitting: Hematology

## 2021-10-20 DIAGNOSIS — C50919 Malignant neoplasm of unspecified site of unspecified female breast: Secondary | ICD-10-CM

## 2021-10-21 NOTE — Telephone Encounter (Signed)
Request for refill of Verzenio, order previously sent in on 10/19/21

## 2021-10-22 ENCOUNTER — Encounter: Payer: Self-pay | Admitting: Cardiology

## 2021-10-22 ENCOUNTER — Telehealth: Payer: Self-pay | Admitting: Hematology

## 2021-10-22 NOTE — Telephone Encounter (Signed)
.  Called patient to schedule appointment per 5/19 inbakset, patient is aware of date and time.

## 2021-10-26 ENCOUNTER — Other Ambulatory Visit: Payer: Medicare Other

## 2021-10-26 DIAGNOSIS — E782 Mixed hyperlipidemia: Secondary | ICD-10-CM

## 2021-10-26 DIAGNOSIS — I10 Essential (primary) hypertension: Secondary | ICD-10-CM

## 2021-10-26 DIAGNOSIS — E538 Deficiency of other specified B group vitamins: Secondary | ICD-10-CM | POA: Diagnosis not present

## 2021-10-27 DIAGNOSIS — I951 Orthostatic hypotension: Secondary | ICD-10-CM | POA: Diagnosis not present

## 2021-10-27 DIAGNOSIS — N390 Urinary tract infection, site not specified: Secondary | ICD-10-CM | POA: Diagnosis not present

## 2021-10-27 DIAGNOSIS — C50919 Malignant neoplasm of unspecified site of unspecified female breast: Secondary | ICD-10-CM | POA: Diagnosis not present

## 2021-10-27 DIAGNOSIS — N1831 Chronic kidney disease, stage 3a: Secondary | ICD-10-CM | POA: Diagnosis not present

## 2021-10-27 LAB — BASIC METABOLIC PANEL
BUN: 18 (ref 4–21)
CO2: 33 — AB (ref 13–22)
Chloride: 101 (ref 99–108)
Creatinine: 1.4 — AB (ref 0.5–1.1)
Glucose: 99
Potassium: 3.5 mEq/L (ref 3.5–5.1)
Sodium: 142 (ref 137–147)

## 2021-10-27 LAB — IRON,TIBC AND FERRITIN PANEL
%SAT: 30
Ferritin: 158
Iron: 88
TIBC: 292
UIBC: 204

## 2021-10-27 LAB — CBC WITH DIFFERENTIAL/PLATELET
Absolute Monocytes: 228 cells/uL (ref 200–950)
Basophils Absolute: 28 cells/uL (ref 0–200)
Basophils Relative: 0.7 %
Eosinophils Absolute: 80 cells/uL (ref 15–500)
Eosinophils Relative: 2 %
HCT: 34.9 % — ABNORMAL LOW (ref 35.0–45.0)
Hemoglobin: 11.8 g/dL (ref 11.7–15.5)
Lymphs Abs: 2732 cells/uL (ref 850–3900)
MCH: 35.6 pg — ABNORMAL HIGH (ref 27.0–33.0)
MCHC: 33.8 g/dL (ref 32.0–36.0)
MCV: 105.4 fL — ABNORMAL HIGH (ref 80.0–100.0)
MPV: 9.6 fL (ref 7.5–12.5)
Monocytes Relative: 5.7 %
Neutro Abs: 932 cells/uL — ABNORMAL LOW (ref 1500–7800)
Neutrophils Relative %: 23.3 %
Platelets: 242 10*3/uL (ref 140–400)
RBC: 3.31 10*6/uL — ABNORMAL LOW (ref 3.80–5.10)
RDW: 13.4 % (ref 11.0–15.0)
Total Lymphocyte: 68.3 %
WBC: 4 10*3/uL (ref 3.8–10.8)

## 2021-10-27 LAB — PROTEIN / CREATININE RATIO, URINE: Creatinine, Urine: 123.4

## 2021-10-27 LAB — LIPID PANEL
Cholesterol: 166 mg/dL (ref <200)
HDL: 74 mg/dL (ref >=50)
LDL Cholesterol (Calc): 67 mg/dL (calc)
Non-HDL Cholesterol (Calc): 92 mg/dL (calc) (ref <130)
Total CHOL/HDL Ratio: 2.2 (calc) (ref <5.0)
Triglycerides: 172 mg/dL — ABNORMAL HIGH (ref <150)

## 2021-10-27 LAB — CBC AND DIFFERENTIAL: Hemoglobin: 11.7 — AB (ref 12.0–16.0)

## 2021-10-27 LAB — COMPREHENSIVE METABOLIC PANEL
Albumin: 3.8 (ref 3.5–5.0)
Calcium: 9.5 (ref 8.7–10.7)
eGFR: 44

## 2021-10-27 LAB — VITAMIN B12: Vitamin B-12: 438 pg/mL (ref 200–1100)

## 2021-11-02 ENCOUNTER — Encounter: Payer: Self-pay | Admitting: Specialist

## 2021-11-04 ENCOUNTER — Encounter: Payer: Self-pay | Admitting: Family

## 2021-11-04 ENCOUNTER — Ambulatory Visit: Payer: Medicare Other | Admitting: Cardiology

## 2021-11-04 ENCOUNTER — Encounter: Payer: Self-pay | Admitting: Cardiology

## 2021-11-04 VITALS — BP 110/66 | HR 98 | Ht 67.0 in | Wt 211.4 lb

## 2021-11-04 DIAGNOSIS — E782 Mixed hyperlipidemia: Secondary | ICD-10-CM | POA: Diagnosis not present

## 2021-11-04 DIAGNOSIS — Q2111 Secundum atrial septal defect: Secondary | ICD-10-CM | POA: Diagnosis not present

## 2021-11-04 DIAGNOSIS — Z79899 Other long term (current) drug therapy: Secondary | ICD-10-CM

## 2021-11-04 DIAGNOSIS — N1831 Chronic kidney disease, stage 3a: Secondary | ICD-10-CM

## 2021-11-04 DIAGNOSIS — I951 Orthostatic hypotension: Secondary | ICD-10-CM

## 2021-11-04 MED ORDER — MIDODRINE HCL 10 MG PO TABS: 10.0000 mg | ORAL_TABLET | Freq: Three times a day (TID) | ORAL | 3 refills | Status: DC

## 2021-11-04 NOTE — Progress Notes (Unsigned)
Cardiology Office Note:    Date:  11/06/2021   ID:  Brandy [name], DOB 1965-10-15, MRN [id number]  PCP:  Sandrea Hughs, NP  Cardiologist:  Berniece Salines, DO  Electrophysiologist:  None   Referring MD: Sandrea Hughs, NP   " I am feeling better"   History of Present Illness:    Brandy [name] is a 56 y.o. female with a hx of  hx of secundum ASD s/p repair in 2006, prior catheterization in 2013 with did not show any evidence of coronary artery disease, anxiety, lung cancer status post surgery is here today for follow-up visit.   I first saw the patient on June 03, 2021 at that time she was experiencing significant shortness of breath and chest discomfort.  Given her history I recommend the patient undergo a coronary CTA along with an echocardiogram.  All of her testing did not show any evidence of coronary artery disease or any significant structural abnormality.  At her last visit in February 2022 she was still experiencing some lightheadedness I started the patient on midodrine given her orthostatic hypotension.  I encouraged abdominal binder as well as compression socks.  She was experiencing some headache I referred the patient to neurology.  Since I saw the patient her midodrine has been increased to 10 mg every 8 hours.  Her dizziness has improved some she tells me.  Past Medical History:  Diagnosis Date   Acute pansinusitis 08/02/2017   Anxiety    Arthritis    ASD (atrial septal defect)    s/p closure with Amplatzer device 10/05/04 (Dr. Myriam Jacobson, [place]) 10/05/04   Cancer ([place])    Cataract    Dyspnea    GERD (gastroesophageal reflux disease)    Heart murmur    no longer heard   History of hiatal hernia    Legally blind in right eye, as defined in [place]    Lumbar herniated disc    PONV (postoperative nausea and vomiting)    Sciatica     Past Surgical History:  Procedure Laterality Date   ABLATION     BREAST LUMPECTOMY WITH  RADIOACTIVE SEED AND SENTINEL LYMPH NODE BIOPSY Right 11/23/2020   Procedure: RIGHT BREAST LUMPECTOMY WITH RADIOACTIVE SEED AND RIGHT AXILLARY SENTINEL LYMPH NODE BIOPSY;  Surgeon: Rolm Bookbinder, MD;  Location: [place];  Service: General;  Laterality: Right;   BREAST SURGERY Bilateral 2011   Breast Reduction Surgery   BUNIONECTOMY     CARDIAC CATHETERIZATION     10/05/04 [facility]): LM < 25%, otherwise normal coronaries. No pulmonary HTN, Mildly enlarged RV. Secundum ASD s/p closure.   CARDIAC SURGERY     CATARACT EXTRACTION     CLEFT PALATE REPAIR     s/p cleft lip and palate repair   EYE SURGERY Right 2019   right eye removed   LUMBAR LAMINECTOMY/DECOMPRESSION MICRODISCECTOMY Left 05/23/2016   Procedure: LEFT L4-L5 LATERAL RECESS DECOMPRESSION WITH CENTRAL AND RIGHT DECOMPRESSION VIA LEFT SIDE;  Surgeon: [name], MD;  Location: [place];  Service: Orthopedics;  Laterality: Left;   LUMBAR LAMINECTOMY/DECOMPRESSION MICRODISCECTOMY Left 05/23/2016   Procedure: LUMBAR LAMINECTOMY/DECOMPRESSION MICRODISCECTOMY Lumbar five - Sacral One 1 LEVEL;  Surgeon: [name], MD;  Location: [place];  Service: Orthopedics;  Laterality: Left;   REDUCTION MAMMAPLASTY  2011   SHOULDER INJECTION Left 05/23/2016   Procedure: SHOULDER INJECTION;  Surgeon: [name], MD;  Location: [place];  Service: Orthopedics;  Laterality: Left;  band-aid per pa-c  TRANSTHORACIC ECHOCARDIOGRAM     12/15/05 [facility]): Mild LVH, EF > 01%, grade 1 diastolic dysfunction, Trivial MR/PR/TR.   TUBAL LIGATION      Current Medications: Current Meds  Medication Sig   abemaciclib (VERZENIO) 100 MG tablet Take 1 tablet (100 mg total) by mouth 2 (two) times daily. Swallow tablets whole. Do not chew, crush, or split tablets before swallowing.   acetaminophen (TYLENOL) 500 MG tablet Take 500-1,000 mg by mouth every 6 (six) hours as needed for moderate pain or headache.   albuterol (PROVENTIL) (2.5 MG/3ML) 0.083% nebulizer solution Take  3 mLs (2.5 mg total) by nebulization every 6 (six) hours as needed for wheezing or shortness of breath.   ALPRAZolam (XANAX) 1 MG tablet Take 1 tablet (1 mg total) by mouth at bedtime.   Carboxymethylcellul-Glycerin (LUBRICATING EYE DROPS OP) Place 1 drop into the left eye 4 (four) times daily.   Cyanocobalamin (B-12 PO) Take 1 tablet by mouth daily.   diphenoxylate-atropine (LOMOTIL) 2.5-0.025 MG tablet Take 1 tablet by mouth 3 (three) times daily as needed for diarrhea or loose stools.   DULoxetine (CYMBALTA) 20 MG capsule Take 1 capsule (20 mg total) by mouth at bedtime.   loperamide (IMODIUM) 2 MG capsule Take 1-2 capsules (2-4 mg total) by mouth 4 (four) times daily as needed for diarrhea or loose stools.   magnesium oxide (MAG-OX) 400 MG tablet Take 1 tablet (400 mg total) by mouth 2 (two) times daily.   midodrine (PROAMATINE) 10 MG tablet Take 1 tablet (10 mg total) by mouth 3 (three) times daily.   Multiple Vitamin (MULTIVITAMIN WITH MINERALS) TABS tablet Take 1 tablet by mouth daily.   omeprazole (PRILOSEC OTC) 20 MG tablet Take 20 mg by mouth daily.   ondansetron (ZOFRAN) 8 MG tablet TAKE 1 TABLET(8 MG) BY MOUTH EVERY 8 HOURS AS NEEDED FOR NAUSEA OR VOMITING   Vitamin D, Ergocalciferol, (DRISDOL) 1.25 MG (50000 UNIT) CAPS capsule TAKE 1 CAPSULE ONCE PER WEEK   White Petrolatum-Mineral Oil (LUBRICANT EYE NIGHTTIME) OINT Place 1 drop into the left eye at bedtime.   [DISCONTINUED] midodrine (PROAMATINE) 5 MG tablet Take 1 tablet (5 mg total) by mouth every 8 (eight) hours.     Allergies:   Zithromax [azithromycin], Other, and Psyllium   Social History   Socioeconomic History   Marital status: Married    Spouse name: Not on file   Number of children: 3   Years of education: Not on file   Highest education level: Not on file  Occupational History   Not on file  Tobacco Use   Smoking status: Former    Packs/day: 0.25    Years: 25.00    Pack years: 6.25    Types: Cigarettes     Quit date: 02/04/2018    Years since quitting: 3.7   Smokeless tobacco: Never  Vaping Use   Vaping Use: Never used  Substance and Sexual Activity   Alcohol use: No   Drug use: No   Sexual activity: Not on file  Other Topics Concern   Not on file  Social History Narrative   Tobacco use, amount per day now: None.   Past tobacco use, amount per day: 1/4   How many years did you use tobacco: Intermittent x 20 years   Alcohol use (drinks per week): N/A   Diet: Plant Base   Do you drink/eat things with caffeine: Coffee, Tea, Soda.   Marital status:    Married  What year were you married? 1999   Do you live in a house, apartment, assisted living, condo, trailer, etc.? House    Is it one or more stories? 2   How many persons live in your home? 6 adults, 5 children.   Do you have pets in your home?( please list) 1 Terrier   Highest Level of education completed? AAS   Current or past profession: LPN   Do you exercise?   A little                               Type and how often? Walk, stretches.    Do you have a living will? No   Do you have a DNR form?   No                                If not, do you want to discuss one?   Do you have signed POA/HPOA forms? No                       If so, please bring to you appointment      Do you have any difficulty bathing or dressing yourself? No   Do you have any difficulty preparing food or eating? No   Do you have any difficulty managing your medications? No   Do you have any difficulty managing your finances? No   Do you have any difficulty affording your medications? No.   Social Determinants of Health   Financial Resource Strain: Not on file  Food Insecurity: Not on file  Transportation Needs: Not on file  Physical Activity: Not on file  Stress: Not on file  Social Connections: Not on file     Family History: The patient's family history includes Arthritis in her mother; Cancer in her father; Diabetes in her  mother; High blood pressure in her mother; Hypertension in her mother. There is no history of Colon cancer, Colon polyps, Esophageal cancer, Rectal cancer, or Stomach cancer.  ROS:   Review of Systems  Constitution: Negative for decreased appetite, fever and weight gain.  HENT: Negative for congestion, ear discharge, hoarse voice and sore throat.   Eyes: Negative for discharge, redness, vision loss in right eye and visual halos.  Cardiovascular: Negative for chest pain, dyspnea on exertion, leg swelling, orthopnea and palpitations.  Respiratory: Negative for cough, hemoptysis, shortness of breath and snoring.   Endocrine: Negative for heat intolerance and polyphagia.  Hematologic/Lymphatic: Negative for bleeding problem. Does not bruise/bleed easily.  Skin: Negative for flushing, nail changes, rash and suspicious lesions.  Musculoskeletal: Negative for arthritis, joint pain, muscle cramps, myalgias, neck pain and stiffness.  Gastrointestinal: Negative for abdominal pain, bowel incontinence, diarrhea and excessive appetite.  Genitourinary: Negative for decreased libido, genital sores and incomplete emptying.  Neurological: Negative for brief paralysis, focal weakness, headaches and loss of balance.  Psychiatric/Behavioral: Negative for altered mental status, depression and suicidal ideas.  Allergic/Immunologic: Negative for HIV exposure and persistent infections.    EKGs/Labs/Other Studies Reviewed:    The following studies were reviewed today:   EKG:  None today   TTE 06/03/2022 IMPRESSIONS     1. Left ventricular ejection fraction, by estimation, is 60 to 65%. The  left ventricle has normal function. The left ventricle has no regional  wall motion abnormalities. Left ventricular diastolic parameters were  normal.   2. Right ventricular systolic function is normal. The right ventricular  size is normal.   3. The mitral valve is grossly normal. No evidence of mitral valve   regurgitation. No evidence of mitral stenosis.   4. The aortic valve is tricuspid. Aortic valve regurgitation is not  visualized. No aortic stenosis is present.   Conclusion(s)/Recommendation(s): Normal biventricular function without  evidence of hemodynamically significant valvular heart disease.   FINDINGS   Left Ventricle: Left ventricular ejection fraction, by estimation, is 60  to 65%. The left ventricle has normal function. The left ventricle has no  regional wall motion abnormalities. Definity contrast agent was given IV  to delineate the left ventricular   endocardial borders. The left ventricular internal cavity size was normal  in size. There is no left ventricular hypertrophy. Left ventricular  diastolic parameters were normal.   Right Ventricle: The right ventricular size is normal. No increase in  right ventricular wall thickness. Right ventricular systolic function is  normal.   Left Atrium: Left atrial size was normal in size.   Right Atrium: Right atrial size was normal in size.   Pericardium: Trivial pericardial effusion is present.   Mitral Valve: The mitral valve is grossly normal. No evidence of mitral  valve regurgitation. No evidence of mitral valve stenosis.   Tricuspid Valve: The tricuspid valve is grossly normal. Tricuspid valve  regurgitation is trivial. No evidence of tricuspid stenosis.   Aortic Valve: The aortic valve is tricuspid. Aortic valve regurgitation is  not visualized. No aortic stenosis is present.   Pulmonic Valve: The pulmonic valve was grossly normal. Pulmonic valve  regurgitation is trivial. No evidence of pulmonic stenosis.   Aorta: The aortic root and ascending aorta are structurally normal, with  no evidence of dilitation.   Venous: The right lower pulmonary vein is normal. The inferior vena cava  was not well visualized.   IAS/Shunts: The atrial septum is grossly normal.            CCTA 06/17/2021 FINDINGS: Image  quality: Excellent.   Noise artifact is: Limited.   Coronary Arteries:  Normal coronary origin.  Left dominance.   Left main: The left main is a large caliber vessel with a normal take off from the left coronary cusp that bifurcates to form a left anterior descending artery and a left circumflex artery. trifurcates into a LAD, LCX, and ramus intermedius. There is no plaque or stenosis.   Left anterior descending artery: The LAD is patent without evidence of plaque or stenosis. 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 The LAD gives off 3 patent diagonal branches. The LAD is large and wraps around the apex to supply the distal 1/3 of the posterior interventricular groove.   Left circumflex artery: The LCX is non-dominant and patent with no evidence of plaque or stenosis. The LCX gives off 2 patent obtuse marginal branches.   Right coronary artery: The RCA is non-dominant with normal take off from the right coronary cusp. There is no evidence of plaque or stenosis.   Right Atrium: Right atrial size is within normal limits. An interatrial septal closure device is present without residual leak.   Right Ventricle: The right ventricular cavity is within normal limits.   Left Atrium: Left atrial size is normal in size with no left atrial appendage filling defect.   Left Ventricle: The ventricular cavity size is within normal limits. There are no stigmata of prior infarction. There is no abnormal filling defect.  Pulmonary arteries: Normal in size without proximal filling defect.   Pulmonary veins: Normal pulmonary venous drainage.   Pericardium: Normal thickness with no significant effusion or calcium present.   Cardiac valves: The aortic valve is trileaflet without significant calcification. The mitral valve is normal structure without significant calcification.   Aorta: Normal caliber with no significant disease.   Extra-cardiac findings: See attached radiology  report for non-cardiac structures.   IMPRESSION: 1. Coronary calcium score of 0.   2. Normal coronary origin with left dominance.   3. Normal coronary arteries.   4. Mid LAD myocardial bridge (benign).   5. An interatrial septal closure device is present without residual leak.   RECOMMENDATIONS: 1. No evidence of CAD (0%). Consider non-atherosclerotic causes of chest pain.   Eleonore Chiquito, MD     Electronically Signed   By: Eleonore Chiquito M.D.   On: 06/17/2021 11:24    Addended by Geralynn Rile, MD on 06/17/2021 11:26 AM    Study Result   Narrative & Impression  EXAM: OVER-READ INTERPRETATION  CT CHEST   The following report is an over-read performed by radiologist Dr. Vinnie Langton of [facility], [place] on 06/17/2021. This over-read does not include interpretation of cardiac or coronary anatomy or pathology. The coronary calcium score/coronary CTA interpretation by the cardiologist is attached.   COMPARISON:  Chest CT 05/21/2020.   FINDINGS: Atherosclerotic calcifications in the thoracic aorta. Within the visualized portions of the thorax there are no suspicious appearing pulmonary nodules or masses, there is no acute consolidative airspace disease, no pleural effusions, no pneumothorax and no lymphadenopathy. Visualized portions of the upper abdomen are unremarkable. There are no aggressive appearing lytic or blastic lesions noted in the visualized portions of the skeleton.   IMPRESSION: 1.  Aortic Atherosclerosis (ICD10-I70.0).   Electronically Signed: By: Vinnie Langton M.D. On: 06/17/2021 09:02    Recent Labs: 05/28/2021: TSH 0.91 06/14/2021: Magnesium 1.5 10/19/2021: ALT 70 10/26/2021: Platelets 242 10/27/2021: BUN 18; Creatinine 1.4; Hemoglobin 11.7; Potassium 3.5; Sodium 142  Recent Lipid Panel    Component Value Date/Time   CHOL 166 10/26/2021 1101   TRIG 172 (H) 10/26/2021 1101   HDL 74 10/26/2021 1101   CHOLHDL 2.2  10/26/2021 1101   LDLCALC 67 10/26/2021 1101    Physical Exam:    VS:  BP 110/66   Pulse 98   Ht '5\' 7"'$  (1.702 m)   Wt 211 lb 6.4 oz (95.9 kg)   SpO2 98%   BMI 33.11 kg/m     Wt Readings from Last 3 Encounters:  11/04/21 211 lb 6.4 oz (95.9 kg)  10/08/21 210 lb (95.3 kg)  09/24/21 211 lb 6.4 oz (95.9 kg)     GEN: Well nourished, well developed in no acute distress HEENT: Normal NECK: No JVD; No carotid bruits LYMPHATICS: No lymphadenopathy CARDIAC: S1S2 noted,RRR, no murmurs, rubs, gallops RESPIRATORY:  Clear to auscultation without rales, wheezing or rhonchi  ABDOMEN: Soft, non-tender, non-distended, +bowel sounds, no guarding. EXTREMITIES: No edema, No cyanosis, no clubbing MUSCULOSKELETAL:  No deformity  SKIN: Warm and dry NEUROLOGIC:  Alert and oriented x 3, non-focal PSYCHIATRIC:  Normal affect, good insight  ASSESSMENT:    1. Orthostatic hypotension   2. Secundum ASD   3. Stage 3a chronic kidney disease ([place])   4. Medication management   5. Mixed hyperlipidemia    PLAN:     She has had some improvement in her symptoms.  She is on midodrine 10 mg every 8  hours. Her blood pressure deceptively in the office.  I still continue to encourage the patient to use compression socks as well as abdominal binder.  Hyperlipidemia - continue with current statin medication.  The patient is in agreement with the above plan. The patient left the office in stable condition.  The patient will follow up in   Medication Adjustments/Labs and Tests Ordered: Current medicines are reviewed at length with the patient today.  Concerns regarding medicines are outlined above.  No orders of the defined types were placed in this encounter.  Meds ordered this encounter  Medications   midodrine (PROAMATINE) 10 MG tablet    Sig: Take 1 tablet (10 mg total) by mouth 3 (three) times daily.    Dispense:  270 tablet    Refill:  3    Patient Instructions  Medication Instructions:  Your  physician recommends that you continue on your current medications as directed. Please refer to the Current Medication list given to you today.  *If you need a refill on your cardiac medications before your next appointment, please call your pharmacy*   Lab Work: None If you have labs (blood work) drawn today and your tests are completely normal, you will receive your results only by: [place] (if you have MyChart) OR A paper copy in the mail If you have any lab test that is abnormal or we need to change your treatment, we will call you to review the results.   Testing/Procedures: None   Follow-Up: At [facility], you and your health needs are our priority.  As part of our continuing mission to provide you with exceptional heart care, we have created designated Provider Care Teams.  These Care Teams include your primary Cardiologist (physician) and Advanced Practice Providers (APPs -  Physician Assistants and Nurse Practitioners) who all work together to provide you with the care you need, when you need it.  We recommend signing up for the patient portal called "MyChart".  Sign up information is provided on this After Visit Summary.  MyChart is used to connect with patients for Virtual Visits (Telemedicine).  Patients are able to view lab/test results, encounter notes, upcoming appointments, etc.  Non-urgent messages can be sent to your provider as well.   To learn more about what you can do with MyChart, go to [email or web address].    Your next appointment:   6 month(s)  The format for your next appointment:   In Person  Provider:   Berniece Salines, DO     Other Instructions   Important Information About Sugar         Adopting a Healthy Lifestyle.  Know what a healthy weight is for you (roughly BMI <25) and aim to maintain this   Aim for 7+ servings of fruits and vegetables daily   65-80+ fluid ounces of water or unsweet tea for healthy kidneys   Limit to  max 1 drink of alcohol per day; avoid smoking/tobacco   Limit animal fats in diet for cholesterol and heart health - choose grass fed whenever available   Avoid highly processed foods, and foods high in saturated/trans fats   Aim for low stress - take time to unwind and care for your mental health   Aim for 150 min of moderate intensity exercise weekly for heart health, and weights twice weekly for bone health   Aim for 7-9 hours of sleep daily   When it comes to diets, agreement about the perfect plan isnt easy to find,  even among the experts. Experts at the [employer] developed an idea known as the Healthy Eating Plate. Just imagine a plate divided into logical, healthy portions.   The emphasis is on diet quality:   Load up on vegetables and fruits - one-half of your plate: Aim for color and variety, and remember that potatoes dont count.   Go for whole grains - one-quarter of your plate: Whole wheat, barley, wheat berries, quinoa, oats, brown rice, and foods made with them. If you want pasta, go with whole wheat pasta.   Protein power - one-quarter of your plate: Fish, chicken, beans, and nuts are all healthy, versatile protein sources. Limit red meat.   The diet, however, does go beyond the plate, offering a few other suggestions.   Use healthy plant oils, such as olive, canola, soy, corn, sunflower and peanut. Check the labels, and avoid partially hydrogenated oil, which have unhealthy trans fats.   If youre thirsty, drink water. Coffee and tea are good in moderation, but skip sugary drinks and limit milk and dairy products to one or two daily servings.   The type of carbohydrate in the diet is more important than the amount. Some sources of carbohydrates, such as vegetables, fruits, whole grains, and beans-are healthier than others.   Finally, stay active  Signed, Berniece Salines, DO  11/06/2021 7:48 PM    [facility]

## 2021-11-04 NOTE — Patient Instructions (Signed)
Medication Instructions:  ?Your physician recommends that you continue on your current medications as directed. Please refer to the Current Medication list given to you today.  ?*If you need a refill on your cardiac medications before your next appointment, please call your pharmacy* ? ? ?Lab Work: ?None ?If you have labs (blood work) drawn today and your tests are completely normal, you will receive your results only by: ?MyChart Message (if you have MyChart) OR ?A paper copy in the mail ?If you have any lab test that is abnormal or we need to change your treatment, we will call you to review the results. ? ? ?Testing/Procedures: ?None ? ? ?Follow-Up: ?At [facility], you and your health needs are our priority.  As part of our continuing mission to provide you with exceptional heart care, we have created designated Provider Care Teams.  These Care Teams include your primary Cardiologist (physician) and Advanced Practice Providers (APPs -  Physician Assistants and Nurse Practitioners) who all work together to provide you with the care you need, when you need it. ? ?We recommend signing up for the patient portal called "MyChart".  Sign up information is provided on this After Visit Summary.  MyChart is used to connect with patients for Virtual Visits (Telemedicine).  Patients are able to view lab/test results, encounter notes, upcoming appointments, etc.  Non-urgent messages can be sent to your provider as well.   ?To learn more about what you can do with MyChart, go to [email or web address].   ? ?Your next appointment:   ?6 month(s) ? ?The format for your next appointment:   ?In Person ? ?Provider:   ?Kardie Tobb, DO   ? ? ?Other Instructions ? ? ?Important Information About Sugar ? ? ? ? ?  ?

## 2021-11-07 NOTE — Progress Notes (Unsigned)
NEUROLOGY CONSULTATION NOTE  Brandy [name] MRN: [id number] DOB: 1966/04/05  Referring provider: Berniece Salines, DO Primary care provider: Marlowe Sax, NP  Reason for consult:  headaches  Assessment/Plan:   ***   Subjective:  Brandy [name] is a 56 year old female with ASD s/p closure, arthritis, legally blind in right eye and breast cancer who presents for headache. History supplemented by referring provider's note.  MRI of neuro-axis from April personally reviewed.  Reports frequent headaches since ***  She is followed by cardiology for orthostatic hypotension.  Treated with midodrine.  She was initially experiencing shortness of breath and chest discomfort.  Coronary CTA and echocardiogram were unremarkable.    History of chronic neck pain and low back pain with sciatica.  Has been followed by orthopedics.  Underwent workup in Aprl.  MRI of brain without contrast was unremarkable.  MRI of cervical spine showed diffuse cervical disc degeneration and advanced facet arthrosis with mild spinal stenosis at C3-4 and C4-5 and moderate right neural foraminal stenosis at C4-5.  MRI of thoracic spine showed mild disc and moderate facet degeneration without spinal or high-grade neural foraminal stenosis.  MRI of lumbar spine showed post-op laminectomy left L4-5 and L5-S1 without recurrent disc protrusion, subarticular stenosis left L4-5 and bilateral L5-S1 and multilevel face degeneration with 7 x 8 mm synovial cyst at L1-2 projecting into posterior epidural space in the midline but not causing significant stenosis.    Current NSAIDS/analgesics:  acetaminophen Current triptans:  none Current ergotamine:  none Current anti-emetic:  Zofran '8mg'$  Current muscle relaxants:  none Current Antihypertensive medications:  none Current Antidepressant medications:  duloxetine '20mg'$  QHS Current Anticonvulsant medications:  none Current anti-CGRP:  none Current  Vitamins/Herbal/Supplements:  magnesium oxide '400mg'$  BID, B12 Current Antihistamines/Decongestants:  none Other therapy:  *** Other medications:  abemaciclib, alprazolam, albuterol, midodrine '10mg'$  TID  Past NSAIDS/analgesics:  ibuprofen, naproxen, tramadol Past abortive triptans:  none Past abortive ergotamine:  none Past muscle relaxants:  baclofen, cylcobenzaprine, methocarbamol Past anti-emetic:  none Past antihypertensive medications:  none Past antidepressant medications:  amitriptyliine, sertraline Past anticonvulsant medications:  gabapentin Past anti-CGRP:  none Past vitamins/Herbal/Supplements:  none Past antihistamines/decongestants:  meclizine Other past therapies:  ***  Caffeine:  *** Alcohol:  *** Smoker:  *** Diet:  *** Exercise:  *** Depression:  ***; Anxiety:  *** Other pain:  Chronic low back pain with sciatica followed by orthopedics. Sleep hygiene:  *** Family history of headache:  ***      PAST MEDICAL HISTORY: Past Medical History:  Diagnosis Date   Acute pansinusitis 08/02/2017   Anxiety    Arthritis    ASD (atrial septal defect)    s/p closure with Amplatzer device 10/05/04 (Dr. Myriam Jacobson, [place]) 10/05/04   Cancer ([place])    Cataract    Dyspnea    GERD (gastroesophageal reflux disease)    Heart murmur    no longer heard   History of hiatal hernia    Legally blind in right eye, as defined in [place]    Lumbar herniated disc    PONV (postoperative nausea and vomiting)    Sciatica     PAST SURGICAL HISTORY: Past Surgical History:  Procedure Laterality Date   ABLATION     BREAST LUMPECTOMY WITH RADIOACTIVE SEED AND SENTINEL LYMPH NODE BIOPSY Right 11/23/2020   Procedure: RIGHT BREAST LUMPECTOMY WITH RADIOACTIVE SEED AND RIGHT AXILLARY SENTINEL LYMPH NODE BIOPSY;  Surgeon: Rolm Bookbinder, MD;  Location: [place];  Service: General;  Laterality: Right;   BREAST SURGERY Bilateral 2011   Breast Reduction Surgery   BUNIONECTOMY     CARDIAC  CATHETERIZATION     10/05/04 [facility]): LM < 25%, otherwise normal coronaries. No pulmonary HTN, Mildly enlarged RV. Secundum ASD s/p closure.   CARDIAC SURGERY     CATARACT EXTRACTION     CLEFT PALATE REPAIR     s/p cleft lip and palate repair   EYE SURGERY Right 2019   right eye removed   LUMBAR LAMINECTOMY/DECOMPRESSION MICRODISCECTOMY Left 05/23/2016   Procedure: LEFT L4-L5 LATERAL RECESS DECOMPRESSION WITH CENTRAL AND RIGHT DECOMPRESSION VIA LEFT SIDE;  Surgeon: [name], MD;  Location: [place];  Service: Orthopedics;  Laterality: Left;   LUMBAR LAMINECTOMY/DECOMPRESSION MICRODISCECTOMY Left 05/23/2016   Procedure: LUMBAR LAMINECTOMY/DECOMPRESSION MICRODISCECTOMY Lumbar five - Sacral One 1 LEVEL;  Surgeon: [name], MD;  Location: [place];  Service: Orthopedics;  Laterality: Left;   REDUCTION MAMMAPLASTY  2011   SHOULDER INJECTION Left 05/23/2016   Procedure: SHOULDER INJECTION;  Surgeon: [name], MD;  Location: [place];  Service: Orthopedics;  Laterality: Left;  band-aid per pa-c   TRANSTHORACIC ECHOCARDIOGRAM     12/15/05 [facility]): Mild LVH, EF > 54%, grade 1 diastolic dysfunction, Trivial MR/PR/TR.   TUBAL LIGATION      MEDICATIONS: Current Outpatient Medications on File Prior to Visit  Medication Sig Dispense Refill   abemaciclib (VERZENIO) 100 MG tablet Take 1 tablet (100 mg total) by mouth 2 (two) times daily. Swallow tablets whole. Do not chew, crush, or split tablets before swallowing. 56 tablet 2   acetaminophen (TYLENOL) 500 MG tablet Take 500-1,000 mg by mouth every 6 (six) hours as needed for moderate pain or headache.     albuterol (PROVENTIL) (2.5 MG/3ML) 0.083% nebulizer solution Take 3 mLs (2.5 mg total) by nebulization every 6 (six) hours as needed for wheezing or shortness of breath. 180 mL 1   ALPRAZolam (XANAX) 1 MG tablet Take 1 tablet (1 mg total) by mouth at bedtime. 30 tablet 5   Carboxymethylcellul-Glycerin (LUBRICATING EYE DROPS OP) Place 1 drop into the  left eye 4 (four) times daily.     Cyanocobalamin (B-12 PO) Take 1 tablet by mouth daily.     diphenoxylate-atropine (LOMOTIL) 2.5-0.025 MG tablet Take 1 tablet by mouth 3 (three) times daily as needed for diarrhea or loose stools. 30 tablet 0   DULoxetine (CYMBALTA) 20 MG capsule Take 1 capsule (20 mg total) by mouth at bedtime. 30 capsule 3   loperamide (IMODIUM) 2 MG capsule Take 1-2 capsules (2-4 mg total) by mouth 4 (four) times daily as needed for diarrhea or loose stools. 30 capsule 1   magnesium oxide (MAG-OX) 400 MG tablet Take 1 tablet (400 mg total) by mouth 2 (two) times daily. 14 tablet 0   midodrine (PROAMATINE) 10 MG tablet Take 1 tablet (10 mg total) by mouth 3 (three) times daily. 270 tablet 3   Multiple Vitamin (MULTIVITAMIN WITH MINERALS) TABS tablet Take 1 tablet by mouth daily.     omeprazole (PRILOSEC OTC) 20 MG tablet Take 20 mg by mouth daily.     ondansetron (ZOFRAN) 8 MG tablet TAKE 1 TABLET(8 MG) BY MOUTH EVERY 8 HOURS AS NEEDED FOR NAUSEA OR VOMITING 30 tablet 3   rosuvastatin (CRESTOR) 10 MG tablet Take 1 tablet (10 mg total) by mouth daily. 90 tablet 3   Vitamin D, Ergocalciferol, (DRISDOL) 1.25 MG (50000 UNIT) CAPS capsule TAKE 1 CAPSULE ONCE PER WEEK 12  capsule 3   White Petrolatum-Mineral Oil (LUBRICANT EYE NIGHTTIME) OINT Place 1 drop into the left eye at bedtime.     No current facility-administered medications on file prior to visit.    ALLERGIES: Allergies  Allergen Reactions   Zithromax [Azithromycin] Shortness Of Breath and Itching    TOTAL BODY ITCHING [EVEN SOLES OF FEET] WHEEZING    Other     z -pack   Psyllium Nausea And Vomiting    Metamucil     FAMILY HISTORY: Family History  Problem Relation Age of Onset   Arthritis Mother    Hypertension Mother    Diabetes Mother    High blood pressure Mother    Cancer Father        Lung   Colon cancer Neg Hx    Colon polyps Neg Hx    Esophageal cancer Neg Hx    Rectal cancer Neg Hx    Stomach  cancer Neg Hx     Objective:  *** General: No acute distress.  Patient appears well-groomed.   Head:  Normocephalic/atraumatic Eyes:  fundi examined but not visualized Neck: supple, no paraspinal tenderness, full range of motion Back: No paraspinal tenderness Heart: regular rate and rhythm Lungs: Clear to auscultation bilaterally. Vascular: No carotid bruits. Neurological Exam: Mental status: alert and oriented to person, place, and time, recent and remote memory intact, fund of knowledge intact, attention and concentration intact, speech fluent and not dysarthric, language intact. Cranial nerves: CN I: not tested CN II: pupils equal, round and reactive to light, visual fields intact CN III, IV, VI:  full range of motion, no nystagmus, no ptosis CN V: facial sensation intact. CN VII: upper and lower face symmetric CN VIII: hearing intact CN IX, X: gag intact, uvula midline CN XI: sternocleidomastoid and trapezius muscles intact CN XII: tongue midline Bulk & Tone: normal, no fasciculations. Motor:  muscle strength 5/5 throughout Sensation:  Pinprick, temperature and vibratory sensation intact. Deep Tendon Reflexes:  2+ throughout,  toes downgoing.   Finger to nose testing:  Without dysmetria.   Heel to shin:  Without dysmetria.   Gait:  Normal station and stride.  Romberg negative.    Thank you for allowing me to take part in the care of this patient.  Metta Clines, DO  CC:  Berniece Salines, DO  Marlowe Sax, NP

## 2021-11-08 ENCOUNTER — Encounter: Payer: Self-pay | Admitting: Neurology

## 2021-11-08 ENCOUNTER — Ambulatory Visit: Payer: Medicare Other | Admitting: Neurology

## 2021-11-08 VITALS — BP 120/74 | HR 99 | Ht 67.0 in | Wt 211.6 lb

## 2021-11-08 DIAGNOSIS — M5442 Lumbago with sciatica, left side: Secondary | ICD-10-CM

## 2021-11-08 DIAGNOSIS — G43019 Migraine without aura, intractable, without status migrainosus: Secondary | ICD-10-CM | POA: Diagnosis not present

## 2021-11-08 DIAGNOSIS — G8929 Other chronic pain: Secondary | ICD-10-CM | POA: Diagnosis not present

## 2021-11-08 DIAGNOSIS — M5441 Lumbago with sciatica, right side: Secondary | ICD-10-CM

## 2021-11-08 DIAGNOSIS — R251 Tremor, unspecified: Secondary | ICD-10-CM | POA: Diagnosis not present

## 2021-11-08 DIAGNOSIS — I951 Orthostatic hypotension: Secondary | ICD-10-CM

## 2021-11-08 DIAGNOSIS — N1831 Chronic kidney disease, stage 3a: Secondary | ICD-10-CM | POA: Diagnosis not present

## 2021-11-08 NOTE — Patient Instructions (Signed)
Ask Dr. Louanne Skye to switch you back to amitriptyline At earliest onset of migraine, take Ubrelvy '100mg'$ .  May repeat after 2 hours.  Maximum 2 tablets in 24 hours.  Let me know if it worked. Ondansetron for nausea Follow up 5 months.

## 2021-11-08 NOTE — Telephone Encounter (Signed)
This is the second time I printed and gave to JN to review and advise.

## 2021-11-10 ENCOUNTER — Ambulatory Visit: Payer: Medicare Other | Admitting: Specialist

## 2021-11-10 ENCOUNTER — Encounter: Payer: Self-pay | Admitting: Specialist

## 2021-11-10 VITALS — BP 128/74 | HR 89 | Ht 67.0 in | Wt 212.0 lb

## 2021-11-10 DIAGNOSIS — M47812 Spondylosis without myelopathy or radiculopathy, cervical region: Secondary | ICD-10-CM | POA: Diagnosis not present

## 2021-11-10 DIAGNOSIS — M4712 Other spondylosis with myelopathy, cervical region: Secondary | ICD-10-CM | POA: Diagnosis not present

## 2021-11-10 DIAGNOSIS — M4310 Spondylolisthesis, site unspecified: Secondary | ICD-10-CM

## 2021-11-10 DIAGNOSIS — M5136 Other intervertebral disc degeneration, lumbar region: Secondary | ICD-10-CM | POA: Diagnosis not present

## 2021-11-10 DIAGNOSIS — M4316 Spondylolisthesis, lumbar region: Secondary | ICD-10-CM | POA: Diagnosis not present

## 2021-11-10 NOTE — Patient Instructions (Signed)
Avoid bending, stooping and avoid lifting weights greater than 10 lbs. Avoid prolong standing and walking. Avoid frequent bending and stooping  No lifting greater than 10 lbs. May use ice or moist heat for pain. Weight loss is of benefit. Handicap license is approved. Dr. Romona Curls secretary/Assistant will call to arrange for facet injections

## 2021-11-10 NOTE — Progress Notes (Signed)
Office Visit Note   Patient: Brandy [name]           Date of Birth: 1966-06-04           MRN: [id number] Visit Date: 11/10/2021              Requested by: Sandrea Hughs, NP [address] [place],  [place] [address] PCP: Sandrea Hughs, NP   Assessment & Plan: Visit Diagnoses:  1. Spondylolisthesis of lumbar region   2. Other spondylosis with myelopathy, cervical region   3. Grade 1 Retrolisthesis (L4-5, L5-S1)   4. Grade 1 Anterolisthesis (L2-3, L3-4)   5. Degenerative disc disease, lumbar   6. Spondylosis without myelopathy or radiculopathy, cervical region     Plan: Avoid bending, stooping and avoid lifting weights greater than 10 lbs. Avoid prolong standing and walking. Avoid frequent bending and stooping  No lifting greater than 10 lbs. May use ice or moist heat for pain. Weight loss is of benefit. Handicap license is approved. Dr. Romona Curls secretary/Assistant will call to arrange for facet injections   Follow-Up Instructions: Return in about 4 weeks (around 12/08/2021).   Orders:  Orders Placed This Encounter  Procedures   Ambulatory referral to Physical Medicine Rehab   No orders of the defined types were placed in this encounter.     Procedures: No procedures performed   Clinical Data: No additional findings.   Subjective: Chief Complaint  Patient presents with   Lower Back - Follow-up    56 year old female with a collapsing degenerative scoliosis and spondylosis with multiple levels of anterolisthesis and lateral listhesis. She has had previous evaluation by Dr. Eduard Clos and Dr. Alver Fisher at [facility] and eventually had bilateral L2,L3, L4 and L5 SNRB and RFA in 02/2020.   Review of Systems  Constitutional: Negative.   HENT: Negative.    Eyes: Negative.   Respiratory: Negative.    Cardiovascular: Negative.   Gastrointestinal: Negative.   Endocrine: Negative.   Genitourinary: Negative.   Musculoskeletal: Negative.   Skin: Negative.    Allergic/Immunologic: Negative.   Neurological: Negative.   Hematological: Negative.   Psychiatric/Behavioral: Negative.      Objective: Vital Signs: BP 128/74 (BP Location: Left Arm, Patient Position: Sitting)   Pulse 89   Ht '5\' 7"'$  (1.702 m)   Wt 212 lb (96.2 kg)   BMI 33.20 kg/m   Physical Exam Constitutional:      Appearance: She is well-developed.  HENT:     Head: Normocephalic and atraumatic.  Eyes:     Pupils: Pupils are equal, round, and reactive to light.  Pulmonary:     Effort: Pulmonary effort is normal.     Breath sounds: Normal breath sounds.  Abdominal:     General: Bowel sounds are normal.     Palpations: Abdomen is soft.  Musculoskeletal:     Cervical back: Normal range of motion and neck supple.     Lumbar back: Positive right straight leg raise test and positive left straight leg raise test.  Skin:    General: Skin is warm and dry.  Neurological:     Mental Status: She is alert and oriented to person, place, and time.  Psychiatric:        Behavior: Behavior normal.        Thought Content: Thought content normal.        Judgment: Judgment normal.   Back Exam   Tenderness  The patient is experiencing tenderness in the lumbar.  Range of Motion  Extension:  abnormal  Flexion:  abnormal  Lateral bend right:  abnormal  Lateral bend left:  abnormal  Rotation right:  abnormal  Rotation left:  abnormal   Muscle Strength  Right Quadriceps:  5/5  Left Quadriceps:  5/5  Right Hamstrings:  5/5  Left Hamstrings:  5/5   Tests  Straight leg raise right: positive Straight leg raise left: positive  Reflexes  Patellar:  2/4    Specialty Comments:  No specialty comments available.  Imaging: No results found.   PMFS History: Patient Active Problem List   Diagnosis Date Noted   Spinal stenosis, 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    Priority: High    Class: Chronic   Left shoulder tendonitis 05/23/2016    Priority: High     Class: Acute   Snoring 07/14/2021   Fatigue 07/14/2021   Daytime somnolence 07/14/2021   Status post device closure of ASD 06/03/2021   Secundum ASD 06/03/2021   Decreased diffusion capacity 05/14/2021   Shortness of breath 03/04/2021   Restrictive lung disease 04/01/2020   Pulmonary nodules 04/01/2020   Elevated LFTs 03/26/2020   Primary malignant neoplasm of breast with metastasis ([place]) 02/24/2020   Spondylosis without myelopathy or radiculopathy, lumbosacral region 12/31/2019   Chronic low back pain (Bilateral) w/o sciatica 12/31/2019   Chronic hip pain (3ry area of Pain) (Bilateral) (R>L) 11/27/2019   Chronic sacroiliac joint pain (Left) 11/27/2019   Chronic pain syndrome 11/11/2019   Pharmacologic therapy 11/11/2019   Disorder of skeletal system 11/11/2019   Problems influencing health status 11/11/2019   Abnormal MRI, lumbar spine (08/22/2018) 11/11/2019   Chronic low back pain (1ry area of Pain) (Bilateral) w/ sciatica (Bilateral) 11/11/2019   DDD (degenerative disc disease), lumbosacral 11/11/2019   Grade 1 Anterolisthesis (L2-3, L3-4) 11/11/2019   Grade 1 Retrolisthesis (L4-5, L5-S1) 11/11/2019   Lumbar facet hypertrophy (Multilevel) (Bilateral) 11/11/2019   Lumbar facet syndrome (Multilevel) (Bilateral) (R>L) 11/11/2019   Chronic lower extremity pain (2ry area of Pain) (Bilateral) (R>L) 11/11/2019   Neurogenic pain 11/11/2019   Chronic musculoskeletal pain 11/11/2019   Infiltrating ductal carcinoma of breast ([place]) 10/08/2019   Diarrhea 09/04/2019   Fatty liver 09/04/2019   Nausea & vomiting 09/04/2019   Obesity (BMI 30-39.9) 09/04/2019   GERD (gastroesophageal reflux disease)    Hyperlipidemia    CKD (chronic kidney disease), stage III (H[facility])    Depression    Malignant hypertension    Transaminitis    Pneumonitis 08/04/2019   Breast cancer ([place]) 08/04/2019   Tachycardia    Ocular proptosis 02/14/2019   PVD (posterior vitreous detachment), left eye 02/14/2019    Chorioretinal scar of left eye 02/14/2019   Vitreous floaters of left eye 02/14/2019   Bilateral leg weakness 01/24/2019   Insomnia 08/02/2017   Low back pain radiating to both legs 08/02/2017   Migraine 08/02/2017   Neuropathy 08/02/2017   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 Myriam Jacobson, [place]) 10/05/04   Cancer ([facility])    Cataract    Dyspnea    GERD (gastroesophageal reflux disease)    Heart murmur    no longer heard   History of hiatal hernia    Legally blind in right eye, as defined  in [place]    Lumbar herniated disc    PONV (postoperative nausea and vomiting)    Sciatica     Family History  Problem Relation Age of Onset   Arthritis Mother    Hypertension Mother    Diabetes Mother    High blood pressure Mother    Cancer Father        Lung   Colon cancer Neg Hx    Colon polyps Neg Hx    Esophageal cancer Neg Hx    Rectal cancer Neg Hx    Stomach cancer Neg Hx     Past Surgical History:  Procedure Laterality Date   ABLATION     BREAST LUMPECTOMY WITH RADIOACTIVE SEED AND SENTINEL LYMPH NODE BIOPSY Right 11/23/2020   Procedure: RIGHT BREAST LUMPECTOMY WITH RADIOACTIVE SEED AND RIGHT AXILLARY SENTINEL LYMPH NODE BIOPSY;  Surgeon: Rolm Bookbinder, MD;  Location: [place];  Service: General;  Laterality: Right;   BREAST SURGERY Bilateral 2011   Breast Reduction Surgery   BUNIONECTOMY     CARDIAC CATHETERIZATION     10/05/04 [facility]): LM < 25%, otherwise normal coronaries. No pulmonary HTN, Mildly enlarged RV. Secundum ASD s/p closure.   CARDIAC SURGERY     CATARACT EXTRACTION     CLEFT PALATE REPAIR     s/p cleft lip and palate repair   EYE SURGERY Right 2019   right eye removed   LUMBAR LAMINECTOMY/DECOMPRESSION MICRODISCECTOMY Left  05/23/2016   Procedure: LEFT L4-L5 LATERAL RECESS DECOMPRESSION WITH CENTRAL AND RIGHT DECOMPRESSION VIA LEFT SIDE;  Surgeon: [name], MD;  Location: [place];  Service: Orthopedics;  Laterality: Left;   LUMBAR LAMINECTOMY/DECOMPRESSION MICRODISCECTOMY Left 05/23/2016   Procedure: LUMBAR LAMINECTOMY/DECOMPRESSION MICRODISCECTOMY Lumbar five - Sacral One 1 LEVEL;  Surgeon: [name], MD;  Location: [place];  Service: Orthopedics;  Laterality: Left;   REDUCTION MAMMAPLASTY  2011   SHOULDER INJECTION Left 05/23/2016   Procedure: SHOULDER INJECTION;  Surgeon: [name], MD;  Location: [place];  Service: Orthopedics;  Laterality: Left;  band-aid per pa-c   TRANSTHORACIC ECHOCARDIOGRAM     12/15/05 [facility]): Mild LVH, EF > 88%, grade 1 diastolic dysfunction, Trivial MR/PR/TR.   TUBAL LIGATION     Social History   Occupational History   Not on file  Tobacco Use   Smoking status: Former    Packs/day: 0.25    Years: 25.00    Pack years: 6.25    Types: Cigarettes    Quit date: 02/04/2018    Years since quitting: 3.7   Smokeless tobacco: Never  Vaping Use   Vaping Use: Never used  Substance and Sexual Activity   Alcohol use: No   Drug use: No   Sexual activity: Not on file

## 2021-11-10 NOTE — Telephone Encounter (Signed)
Patient was seen today for an appt

## 2021-11-11 ENCOUNTER — Telehealth: Payer: Self-pay | Admitting: Specialist

## 2021-11-11 MED ORDER — AMITRIPTYLINE HCL 10 MG PO TABS: 10.0000 mg | ORAL_TABLET | Freq: Every day | ORAL | 3 refills | Status: DC

## 2021-11-11 NOTE — Addendum Note (Signed)
Addended by: Basil Dess on: 11/11/2021 06:30 PM   Modules accepted: Orders

## 2021-11-11 NOTE — Telephone Encounter (Signed)
Patient called as reminder to Dr. Louanne Skye that he was suppose to stop Cymbalta and restart Elavil. Patient asked for a call back when Rx has been sent to the pharmacy. The number to contact patient is [phone]

## 2021-11-12 NOTE — Telephone Encounter (Signed)
He corrected this and sent in new meds

## 2021-11-14 ENCOUNTER — Other Ambulatory Visit: Payer: Self-pay | Admitting: Family

## 2021-11-14 DIAGNOSIS — C50919 Malignant neoplasm of unspecified site of unspecified female breast: Secondary | ICD-10-CM

## 2021-11-15 NOTE — Telephone Encounter (Signed)
Please verify which pharmacy does patient want her Alprazolam to be send.

## 2021-11-16 ENCOUNTER — Ambulatory Visit: Payer: Medicare Other | Admitting: Physical Medicine and Rehabilitation

## 2021-11-16 ENCOUNTER — Ambulatory Visit: Payer: Self-pay

## 2021-11-16 ENCOUNTER — Encounter: Payer: Self-pay | Admitting: Physical Medicine and Rehabilitation

## 2021-11-16 ENCOUNTER — Other Ambulatory Visit: Payer: Self-pay | Admitting: Family

## 2021-11-16 VITALS — BP 104/66 | HR 118

## 2021-11-16 DIAGNOSIS — M47816 Spondylosis without myelopathy or radiculopathy, lumbar region: Secondary | ICD-10-CM | POA: Diagnosis not present

## 2021-11-16 DIAGNOSIS — C50919 Malignant neoplasm of unspecified site of unspecified female breast: Secondary | ICD-10-CM

## 2021-11-16 MED ORDER — BUPIVACAINE HCL 0.5 % IJ SOLN
3.0000 mL | Freq: Once | INTRAMUSCULAR | Status: AC
  Administered 2021-11-16: 3 mL

## 2021-11-16 NOTE — Progress Notes (Unsigned)
Pt state lower back pain that travels down both legs. Pt state walking, standing and laying down makes the pain worse. Pt state she takes over the counter pain meds to help ease her pain.  Numeric Pain Rating Scale and Functional Assessment Average Pain 7   In the last MONTH (on 0-10 scale) has pain interfered with the following?  1. General activity like being  able to carry out your everyday physical activities such as walking, climbing stairs, carrying groceries, or moving a chair?  Rating(10)   +Driver, -BT, -Dye Allergies.

## 2021-11-16 NOTE — Patient Instructions (Signed)

## 2021-11-17 ENCOUNTER — Encounter: Payer: Self-pay | Admitting: Physical Medicine and Rehabilitation

## 2021-11-17 DIAGNOSIS — Q111 Other anophthalmos: Secondary | ICD-10-CM | POA: Diagnosis not present

## 2021-11-17 DIAGNOSIS — H0520 Unspecified exophthalmos: Secondary | ICD-10-CM | POA: Diagnosis not present

## 2021-11-17 DIAGNOSIS — H04202 Unspecified epiphora, left lacrimal gland: Secondary | ICD-10-CM | POA: Diagnosis not present

## 2021-11-18 ENCOUNTER — Telehealth: Payer: Self-pay | Admitting: Specialist

## 2021-11-18 NOTE — Telephone Encounter (Signed)
Pt called stating she received a call from our office stating she need a sooner appt with Dr. Louanne Skye because injection did not work. I do not see on pt's chart that she need to see Dr. Louanne Skye as soon as possible. Also seen at note for pt to see Dr. Marlou Sa Please call pt with clarification of which dr to see and set her appt. Pt phone number is [phone].

## 2021-11-22 NOTE — Telephone Encounter (Signed)
Called and spoke with patient, I advised that she needs to wait at least 2 FULL weeks to see how much the injection has helped. Also as far as Dr. Marlou Sa I am not sure where that came up at, nothing is noted on last ov note, I advised that the new Dr to replace Dr. Louanne Skye is Dr. Laurance Flatten but he does not have a schedule yet to put patients on. I advised that I did put her on the cancellation list and that if we get a cancellation that I will call her to get her in sooner, she states that she understands

## 2021-11-26 ENCOUNTER — Encounter: Payer: Self-pay | Admitting: Cardiology

## 2021-11-26 ENCOUNTER — Encounter: Payer: Self-pay | Admitting: Pulmonary Disease

## 2021-11-26 MED ORDER — FLUTICASONE-SALMETEROL 250-50 MCG/ACT IN AEPB
1.0000 | INHALATION_SPRAY | Freq: Two times a day (BID) | RESPIRATORY_TRACT | 1 refills | Status: DC
Start: 2021-11-26 — End: 2022-05-17

## 2021-11-29 ENCOUNTER — Other Ambulatory Visit: Payer: Self-pay

## 2021-11-29 DIAGNOSIS — C50919 Malignant neoplasm of unspecified site of unspecified female breast: Secondary | ICD-10-CM

## 2021-11-30 ENCOUNTER — Encounter: Payer: Self-pay | Admitting: Family

## 2021-11-30 ENCOUNTER — Other Ambulatory Visit: Payer: Self-pay

## 2021-11-30 ENCOUNTER — Inpatient Hospital Stay: Payer: Medicare Other | Attending: Hematology

## 2021-11-30 ENCOUNTER — Telehealth: Payer: Self-pay | Admitting: Cardiology

## 2021-11-30 ENCOUNTER — Inpatient Hospital Stay (HOSPITAL_BASED_OUTPATIENT_CLINIC_OR_DEPARTMENT_OTHER): Payer: Medicare Other | Admitting: Hematology

## 2021-11-30 ENCOUNTER — Ambulatory Visit (INDEPENDENT_AMBULATORY_CARE_PROVIDER_SITE_OTHER): Payer: Medicare Other | Admitting: Family

## 2021-11-30 VITALS — BP 109/72 | HR 96 | Temp 97.3°F | Resp 20 | Wt 207.9 lb

## 2021-11-30 VITALS — BP 126/70 | HR 105 | Temp 97.2°F | Resp 16 | Ht 67.0 in | Wt 208.8 lb

## 2021-11-30 DIAGNOSIS — C50511 Malignant neoplasm of lower-outer quadrant of right female breast: Secondary | ICD-10-CM | POA: Insufficient documentation

## 2021-11-30 DIAGNOSIS — Z170 Estrogen receptor positive status [ER+]: Secondary | ICD-10-CM | POA: Insufficient documentation

## 2021-11-30 DIAGNOSIS — K219 Gastro-esophageal reflux disease without esophagitis: Secondary | ICD-10-CM | POA: Diagnosis not present

## 2021-11-30 DIAGNOSIS — C50919 Malignant neoplasm of unspecified site of unspecified female breast: Secondary | ICD-10-CM

## 2021-11-30 DIAGNOSIS — M5031 Other cervical disc degeneration,  high cervical region: Secondary | ICD-10-CM | POA: Insufficient documentation

## 2021-11-30 DIAGNOSIS — N1831 Chronic kidney disease, stage 3a: Secondary | ICD-10-CM | POA: Diagnosis not present

## 2021-11-30 DIAGNOSIS — C7800 Secondary malignant neoplasm of unspecified lung: Secondary | ICD-10-CM | POA: Diagnosis not present

## 2021-11-30 DIAGNOSIS — Z833 Family history of diabetes mellitus: Secondary | ICD-10-CM | POA: Insufficient documentation

## 2021-11-30 DIAGNOSIS — Z801 Family history of malignant neoplasm of trachea, bronchus and lung: Secondary | ICD-10-CM | POA: Diagnosis not present

## 2021-11-30 DIAGNOSIS — K760 Fatty (change of) liver, not elsewhere classified: Secondary | ICD-10-CM | POA: Diagnosis not present

## 2021-11-30 DIAGNOSIS — Z8249 Family history of ischemic heart disease and other diseases of the circulatory system: Secondary | ICD-10-CM | POA: Diagnosis not present

## 2021-11-30 DIAGNOSIS — Z8261 Family history of arthritis: Secondary | ICD-10-CM | POA: Insufficient documentation

## 2021-11-30 DIAGNOSIS — Z23 Encounter for immunization: Secondary | ICD-10-CM | POA: Diagnosis not present

## 2021-11-30 DIAGNOSIS — L989 Disorder of the skin and subcutaneous tissue, unspecified: Secondary | ICD-10-CM | POA: Diagnosis not present

## 2021-11-30 DIAGNOSIS — I10 Essential (primary) hypertension: Secondary | ICD-10-CM

## 2021-11-30 DIAGNOSIS — E782 Mixed hyperlipidemia: Secondary | ICD-10-CM | POA: Diagnosis not present

## 2021-11-30 DIAGNOSIS — M4802 Spinal stenosis, cervical region: Secondary | ICD-10-CM | POA: Diagnosis not present

## 2021-11-30 DIAGNOSIS — I700 Atherosclerosis of aorta: Secondary | ICD-10-CM | POA: Insufficient documentation

## 2021-11-30 DIAGNOSIS — Z87891 Personal history of nicotine dependence: Secondary | ICD-10-CM | POA: Diagnosis not present

## 2021-11-30 LAB — CBC WITH DIFFERENTIAL (CANCER CENTER ONLY)
Abs Immature Granulocytes: 0.01 10*3/uL (ref 0.00–0.07)
Basophils Absolute: 0 10*3/uL (ref 0.0–0.1)
Basophils Relative: 1 %
Eosinophils Absolute: 0.1 10*3/uL (ref 0.0–0.5)
Eosinophils Relative: 1 %
HCT: 36 % (ref 36.0–46.0)
Hemoglobin: 12.2 g/dL (ref 12.0–15.0)
Immature Granulocytes: 0 %
Lymphocytes Relative: 54 %
Lymphs Abs: 2.5 10*3/uL (ref 0.7–4.0)
MCH: 36 pg — ABNORMAL HIGH (ref 26.0–34.0)
MCHC: 33.9 g/dL (ref 30.0–36.0)
MCV: 106.2 fL — ABNORMAL HIGH (ref 80.0–100.0)
Monocytes Absolute: 0.3 10*3/uL (ref 0.1–1.0)
Monocytes Relative: 7 %
Neutro Abs: 1.7 10*3/uL (ref 1.7–7.7)
Neutrophils Relative %: 37 %
Platelet Count: 216 10*3/uL (ref 150–400)
RBC: 3.39 MIL/uL — ABNORMAL LOW (ref 3.87–5.11)
RDW: 14.9 % (ref 11.5–15.5)
WBC Count: 4.7 10*3/uL (ref 4.0–10.5)
nRBC: 0 % (ref 0.0–0.2)

## 2021-11-30 LAB — CMP (CANCER CENTER ONLY)
ALT: 70 U/L — ABNORMAL HIGH (ref 0–44)
AST: 81 U/L — ABNORMAL HIGH (ref 15–41)
Albumin: 4 g/dL (ref 3.5–5.0)
Alkaline Phosphatase: 331 U/L — ABNORMAL HIGH (ref 38–126)
Anion gap: 7 (ref 5–15)
BUN: 17 mg/dL (ref 6–20)
CO2: 33 mmol/L — ABNORMAL HIGH (ref 22–32)
Calcium: 9.4 mg/dL (ref 8.9–10.3)
Chloride: 101 mmol/L (ref 98–111)
Creatinine: 1.89 mg/dL — ABNORMAL HIGH (ref 0.44–1.00)
GFR, Estimated: 31 mL/min — ABNORMAL LOW (ref >60)
Glucose, Bld: 107 mg/dL — ABNORMAL HIGH (ref 70–99)
Potassium: 3.9 mmol/L (ref 3.5–5.1)
Sodium: 141 mmol/L (ref 135–145)
Total Bilirubin: 0.6 mg/dL (ref 0.3–1.2)
Total Protein: 7.8 g/dL (ref 6.5–8.1)

## 2021-11-30 MED ORDER — TETANUS-DIPHTH-ACELL PERTUSSIS 5-2.5-18.5 LF-MCG/0.5 IM SUSP: 0.5000 mL | Freq: Once | INTRAMUSCULAR | 0 refills | Status: AC

## 2021-11-30 NOTE — Progress Notes (Signed)
Provider: Richarda Blade FNP-C   Theran Vandergrift, Donalee Citrin, NP  Patient Care Team: Jobany Montellano, Donalee Citrin, NP as PCP - General (Family Medicine) Thomasene Ripple, DO as PCP - Cardiology (Cardiology) Marva Panda, NP as Nurse Practitioner  Extended Emergency Contact Information Primary Emergency Contact: [name],Charles Address: [address]          [place], [place] [address] Darden [place] of [place] Home Phone: [phone] Relation: Spouse Secondary Emergency Contact: [name],Michele Address: [address]          [place], [place] [address] [place] of [place] Home Phone: [phone] Relation: Daughter  Code Status:  Full Code  Goals of care: Advanced Directive information    11/30/2021    9:13 AM  Advanced Directives  Does Patient Have a Medical Advance Directive? No  Would patient like information on creating a medical advance directive? No - Patient declined     Chief Complaint  Patient presents with   Medical Management of Chronic Issues    6 month follow up/ Discuss recent labs.   Health Maintenance    Discuss the need for Pap smear.    Immunizations    Discuss the need for Tetanus vaccine, and Covid Booster.   Concern     Patient wants right elbow looked at. Patient has lump in area.    HPI:  Pt is a 56 y.o. female seen today for 63-month follow-up for medical management of chronic diseases.  Has a medical history of essential hypertension, hyperlipidemia, chronic kidney disease stage III, migraine without aura, GERD, malignant neoplasm of breast with metastasis following up with oncologist at [facility] has appointment later today.  Right elbow bump pain has been getting bigger over time.Has tried to squeeze nothing came out.   Depression - Elavil effective   Migraine without aura -follows up with neurologist.  On amitriptyline 75 mg at bedtime.  She was given samples of Ubrelvy 100 mg and Zofran for nausea.   Hyperlipidemia -has changed her  diet but had omelet with ham/meat/egg yesterday but overall has been eating salmon and shrimp.  Orthostatic hypotension - midodrine effective B/p 104 - 110's.wears abdominal brace but did not wear it today.follows up with cardiologist.  GERD - also has acid reflux if she does not take omeprazole.   Lower back pain - had an epidural but states only gave her a headache will be getting another.  Past Medical History:  Diagnosis Date   Acute pansinusitis 08/02/2017   Anxiety    Arthritis    ASD (atrial septal defect)    s/p closure with Amplatzer device 10/05/04 (Dr. Celso Amy, [facility]) 10/05/04   Cancer (HCC)    Cataract    Dyspnea    GERD (gastroesophageal reflux disease)    Heart murmur    no longer heard   History of hiatal hernia    Legally blind in right eye, as defined in [place]    Lumbar herniated disc    PONV (postoperative nausea and vomiting)    Sciatica    Past Surgical History:  Procedure Laterality Date   ABLATION     BREAST LUMPECTOMY WITH RADIOACTIVE SEED AND SENTINEL LYMPH NODE BIOPSY Right 11/23/2020   Procedure: RIGHT BREAST LUMPECTOMY WITH RADIOACTIVE SEED AND RIGHT AXILLARY SENTINEL LYMPH NODE BIOPSY;  Surgeon: Emelia Loron, MD;  Location: [facility];  Service: General;  Laterality: Right;   BREAST SURGERY Bilateral 2011   Breast Reduction Surgery   BUNIONECTOMY     CARDIAC CATHETERIZATION  10/05/04 [facility]): LM < 25%, otherwise normal coronaries. No pulmonary HTN, Mildly enlarged RV. Secundum ASD s/p closure.   CARDIAC SURGERY     CATARACT EXTRACTION     CLEFT PALATE REPAIR     s/p cleft lip and palate repair   EYE SURGERY Right 2019   right eye removed   LUMBAR LAMINECTOMY/DECOMPRESSION MICRODISCECTOMY Left 05/23/2016   Procedure: LEFT L4-L5 LATERAL RECESS DECOMPRESSION WITH CENTRAL AND RIGHT DECOMPRESSION VIA LEFT SIDE;  Surgeon: [name], MD;  Location: [place];  Service: Orthopedics;  Laterality: Left;   LUMBAR LAMINECTOMY/DECOMPRESSION  MICRODISCECTOMY Left 05/23/2016   Procedure: LUMBAR LAMINECTOMY/DECOMPRESSION MICRODISCECTOMY Lumbar five - Sacral One 1 LEVEL;  Surgeon: [name], MD;  Location: [place];  Service: Orthopedics;  Laterality: Left;   REDUCTION MAMMAPLASTY  2011   SHOULDER INJECTION Left 05/23/2016   Procedure: SHOULDER INJECTION;  Surgeon: [name], MD;  Location: [place];  Service: Orthopedics;  Laterality: Left;  band-aid per pa-c   TRANSTHORACIC ECHOCARDIOGRAM     12/15/05 [facility]): Mild LVH, EF > 91%, grade 1 diastolic dysfunction, Trivial MR/PR/TR.   TUBAL LIGATION      Allergies  Allergen Reactions   Zithromax [Azithromycin] Shortness Of Breath and Itching    TOTAL BODY ITCHING [EVEN SOLES OF FEET] WHEEZING    Other     z -pack   Psyllium Nausea And Vomiting    Metamucil     Allergies as of 11/30/2021       Reactions   Zithromax [azithromycin] Shortness Of Breath, Itching   TOTAL BODY ITCHING [EVEN SOLES OF FEET] WHEEZING   Other    z -pack   Psyllium Nausea And Vomiting   Metamucil        Medication List        Accurate as of November 30, 2021  9:47 AM. If you have any questions, ask your nurse or doctor.          abemaciclib 100 MG tablet Commonly known as: Verzenio Take 1 tablet (100 mg total) by mouth 2 (two) times daily. Swallow tablets whole. Do not chew, crush, or split tablets before swallowing.   acetaminophen 500 MG tablet Commonly known as: TYLENOL Take 500-1,000 mg by mouth every 6 (six) hours as needed for moderate pain or headache.   albuterol (2.5 MG/3ML) 0.083% nebulizer solution Commonly known as: PROVENTIL Take 3 mLs (2.5 mg total) by nebulization every 6 (six) hours as needed for wheezing or shortness of breath.   ALPRAZolam 1 MG tablet Commonly known as: XANAX TAKE 1 TABLET BY MOUTH AT  BEDTIME   amitriptyline 10 MG tablet Commonly known as: ELAVIL Take 1 tablet (10 mg total) by mouth at bedtime.   B-12 PO Take 1 tablet by mouth daily.    diphenoxylate-atropine 2.5-0.025 MG tablet Commonly known as: LOMOTIL Take 1 tablet by mouth 3 (three) times daily as needed for diarrhea or loose stools.   fluticasone-salmeterol 250-50 MCG/ACT Aepb Commonly known as: Wixela Inhub Inhale 1 puff into the lungs in the morning and at bedtime.   ketoconazole 2 % cream Commonly known as: NIZORAL SMARTSIG:1 Application Topical 1 to 2 Times Daily   loperamide 2 MG capsule Commonly known as: IMODIUM Take 1-2 capsules (2-4 mg total) by mouth 4 (four) times daily as needed for diarrhea or loose stools.   Lubricant Eye Nighttime Oint Place 1 drop into the left eye at bedtime.   LUBRICATING EYE DROPS OP Place 1 drop into the left eye  4 (four) times daily.   magnesium oxide 400 MG tablet Commonly known as: MAG-OX Take 1 tablet (400 mg total) by mouth 2 (two) times daily.   midodrine 10 MG tablet Commonly known as: PROAMATINE Take 1 tablet (10 mg total) by mouth 3 (three) times daily.   multivitamin with minerals Tabs tablet Take 1 tablet by mouth daily.   omeprazole 20 MG tablet Commonly known as: PRILOSEC OTC Take 20 mg by mouth daily.   ondansetron 8 MG tablet Commonly known as: ZOFRAN TAKE 1 TABLET(8 MG) BY MOUTH EVERY 8 HOURS AS NEEDED FOR NAUSEA OR VOMITING   rosuvastatin 10 MG tablet Commonly known as: CRESTOR Take 1 tablet (10 mg total) by mouth daily.   triamcinolone cream 0.1 % Commonly known as: KENALOG SMARTSIG:1 Application Topical 2-3 Times Daily   Vitamin D (Ergocalciferol) 1.25 MG (50000 UNIT) Caps capsule Commonly known as: DRISDOL TAKE 1 CAPSULE ONCE PER WEEK        Review of Systems  Constitutional:  Negative for appetite change, chills, fatigue, fever and unexpected weight change.  HENT:  Negative for congestion, dental problem, ear discharge, ear pain, facial swelling, hearing loss, nosebleeds, postnasal drip, rhinorrhea, sinus pressure, sinus pain, sneezing, sore throat, tinnitus and trouble  swallowing.   Eyes:  Positive for visual disturbance. Negative for pain, discharge, redness and itching.       Left eye prosthetic   Respiratory:  Negative for cough, chest tightness, shortness of breath and wheezing.   Cardiovascular:  Negative for chest pain, palpitations and leg swelling.  Gastrointestinal:  Negative for abdominal distention, abdominal pain, blood in stool, constipation, diarrhea, nausea and vomiting.  Endocrine: Negative for cold intolerance, heat intolerance, polydipsia, polyphagia and polyuria.  Genitourinary:  Negative for difficulty urinating, dysuria, flank pain, frequency and urgency.  Musculoskeletal:  Negative for arthralgias, back pain, gait problem, joint swelling, myalgias, neck pain and neck stiffness.  Skin:  Negative for color change, pallor, rash and wound.  Neurological:  Negative for dizziness, syncope, speech difficulty, weakness, light-headedness, numbness and headaches.  Hematological:  Does not bruise/bleed easily.  Psychiatric/Behavioral:  Negative for agitation, behavioral problems, confusion, hallucinations, self-injury, sleep disturbance and suicidal ideas. The patient is not nervous/anxious.     Immunization History  Administered Date(s) Administered   Influenza Inj Mdck Quad Pf 04/25/2019   Influenza,inj,Quad PF,6+ Mos 02/24/2020   Influenza,inj,quad, With Preservative 04/25/2019   Influenza-Unspecified 05/26/2021   Moderna Sars-Covid-2 Vaccination 06/29/2019, 08/03/2019, 06/24/2020   PFIZER(Purple Top)SARS-COV-2 Vaccination 05/26/2021   Pneumococcal Conjugate-13 05/29/2019   Pneumococcal Polysaccharide-23 07/29/2019   Zoster Recombinat (Shingrix) 02/17/2020, 04/20/2020   Pertinent  Health Maintenance Due  Topic Date Due   PAP SMEAR-Modifier  Never done   INFLUENZA VACCINE  01/04/2022   MAMMOGRAM  09/03/2022   COLONOSCOPY (Pts 45-31yrs Insurance coverage will need to be confirmed)  08/16/2027      07/19/2021   10:58 AM 09/14/2021     9:48 AM 09/24/2021    8:37 AM 11/08/2021   10:50 AM 11/30/2021    9:13 AM  Fall Risk  Falls in the past year? 0  0 0 0  Was there an injury with Fall? 0  0 0 0  Fall Risk Category Calculator 0  0 0 0  Fall Risk Category Low  Low Low Low  Patient Fall Risk Level Low fall risk High fall risk Low fall risk Low fall risk Low fall risk  Patient at Risk for Falls Due to No Fall Risks  No Fall  Risks  No Fall Risks  Fall risk Follow up Falls evaluation completed;Education provided;Falls prevention discussed  Falls evaluation completed  Falls evaluation completed   Functional Status Survey:    Vitals:   11/30/21 0926  BP: 126/70  Pulse: (!) 105  Resp: 16  Temp: (!) 97.2 F (36.2 C)  SpO2: 97%  Weight: 208 lb 12.8 oz (94.7 kg)  Height: $Remove'5\' 7"'[id number]$  (1.702 m)   Body mass index is 32.7 kg/m. Physical Exam Vitals reviewed.  Constitutional:      General: She is not in acute distress.    Appearance: Normal appearance. She is normal weight. She is not ill-appearing or diaphoretic.  HENT:     Head: Normocephalic.     Right Ear: Tympanic membrane, ear canal and external ear normal. There is no impacted cerumen.     Left Ear: Tympanic membrane, ear canal and external ear normal. There is no impacted cerumen.     Nose: Nose normal. No congestion or rhinorrhea.     Mouth/Throat:     Mouth: Mucous membranes are moist.     Pharynx: Oropharynx is clear. No oropharyngeal exudate or posterior oropharyngeal erythema.  Eyes:     General: No scleral icterus.       Right eye: No discharge.        Left eye: No discharge.     Conjunctiva/sclera: Conjunctivae normal.     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  Cardiovascular:     Rate and Rhythm: Normal rate and regular rhythm.     Pulses: Normal pulses.     Heart sounds: Normal heart sounds. No murmur heard.    No friction rub. No gallop.  Pulmonary:     Effort: Pulmonary effort is normal. No respiratory  distress.     Breath sounds: Normal breath sounds. No wheezing, rhonchi or rales.  Chest:     Chest wall: No tenderness.  Abdominal:     General: Bowel sounds are normal. There is no distension.     Palpations: Abdomen is soft. There is no mass.     Tenderness: There is no abdominal tenderness. There is no right CVA tenderness, left CVA tenderness, guarding or rebound.  Musculoskeletal:        General: No swelling or tenderness. Normal range of motion.     Cervical back: Normal range of motion. No rigidity or tenderness.     Right lower leg: No edema.     Left lower leg: No edema.  Lymphadenopathy:     Cervical: No cervical adenopathy.  Skin:    General: Skin is warm and dry.     Coloration: Skin is not pale.     Findings: Lesion present. No bruising, erythema or rash.     Comments: Right elbow firm raised lesion slight tender to touch without any erythema.   Neurological:     Mental Status: She is alert and oriented to person, place, and time.     Cranial Nerves: No cranial nerve deficit.     Sensory: No sensory deficit.     Motor: No weakness.     Coordination: Coordination normal.     Gait: Gait normal.  Psychiatric:        Mood and Affect: Mood normal.        Speech: Speech normal.        Behavior: Behavior normal.        Thought Content: Thought content normal.  Judgment: Judgment normal.     Labs reviewed: Recent Labs    06/14/21 1431 07/12/21 1246 08/16/21 1108 09/14/21 0923 10/19/21 1433 10/27/21 0000  NA 143   < > 140 142 146* 142  K 4.7   < > 4.0 3.9 3.8 3.5  CL 102   < > 103 106 107 101  CO2 25   < > 32 31 33* 33*  GLUCOSE 88   < > 102* 99 111*  --   BUN 13   < > $R'16 19 11 18  'lf$ CREATININE 1.48*   < > 1.36* 1.48* 1.66* 1.4*  CALCIUM 9.6   < > 9.5 9.2 9.3 9.5  MG 1.5*  --   --   --   --   --    < > = values in this interval not displayed.   Recent Labs    08/16/21 1108 09/14/21 0923 10/19/21 1433 10/27/21 0000  AST 53* 72* 97*  --   ALT 64*  97* 70*  --   ALKPHOS 206* 233* 237*  --   BILITOT 0.6 0.5 0.6  --   PROT 7.1 7.3 7.5  --   ALBUMIN 3.8 3.8 4.0 3.8   Recent Labs    09/14/21 0923 10/19/21 1433 10/26/21 1101 10/27/21 0000  WBC 4.7 4.7 4.0  --   NEUTROABS 1.5* 1.6* 932*  --   HGB 11.3* 11.9* 11.8 11.7*  HCT 33.5* 35.5* 34.9*  --   MCV 104.4* 104.7* 105.4*  --   PLT 219 244 242  --    Lab Results  Component Value Date   TSH 0.91 05/28/2021   No results found for: "HGBA1C" Lab Results  Component Value Date   CHOL 166 10/26/2021   HDL 74 10/26/2021   LDLCALC 67 10/26/2021   TRIG 172 (H) 10/26/2021   CHOLHDL 2.2 10/26/2021    Significant Diagnostic Results in last 30 days:  XR C-ARM NO REPORT  Result Date: 11/16/2021 Please see Notes tab for imaging impression.   Assessment/Plan  1. Essential hypertension Blood pressure well controlled.  Off Due to orthostatic hypotension -Continue to follow-up with a cardiologist - TSH; Future - COMPLETE METABOLIC PANEL WITH GFR - CBC with Differential/Platelet; Future  2. Mixed hyperlipidemia LDL at goal Triglycerides 172 advised to cut down on carbohydrates and increase vegetables in diet - Lipid panel; Future  3. Stage 3a chronic kidney disease (HCC) CR 1.66 slightly high from baseline 1.4 Will continue to avoid Nephrotoxins and dose all other medication for renal clearance   4. Gastroesophageal reflux disease without esophagitis Symptoms controlled. H/H stable.No tarry or black stool  - advised to avoid eating meals late in the evening and to avoid aggravating foods and spices. - continue on Omeprazole    5. Need for Tdap vaccination Advised to get Tdap vaccine at the pharmacy. Script send to pharmacy today - Tdap ([place]) 5-2.5-18.5 LF-MCG/0.5 injection; Inject 0.5 mLs into the muscle once for 1 dose.  Dispense: 0.5 mL; Refill: 0  6. Skin lesion of right arm Right elbow firm raised lesion slight tender to touch without any erythema.  Will  refer to dermatologist for evaluation    Family/ staff Communication: Reviewed plan of care with patient verbalized understanding   Labs/tests ordered:  - CBC with Differential/Platelet - CMP with eGFR([facility]) - TSH - Lipid panel  Next Appointment : Return in about 6 months (around 06/01/2022) for medical mangement of chronic issues., Fasting labs in 6 months prior to visit.  Sandrea Hughs, NP

## 2021-11-30 NOTE — Telephone Encounter (Signed)
Pt called stating that she has been trying to get a cpap  machine since 07/2021. She states that she has not gotten a call back about this matter and this is very serious to her. She states she would like a call back asap about this. Please advise.

## 2021-11-30 NOTE — Procedures (Signed)
Lumbar Diagnostic Facet Joint Nerve Block with Fluoroscopic Guidance   Patient: Brandy [name]      Date of Birth: March 21, 1966 MRN: [id number] PCP: Caesar Bookman, NP      Visit Date: 11/16/2021   Universal Protocol:    Date/Time: 06/27/236:10 AM  Consent Given By: the patient  Position: PRONE  Additional Comments: Vital signs were monitored before and after the procedure. Patient was prepped and draped in the usual sterile fashion. The correct patient, procedure, and site was verified.   Injection Procedure Details:   Procedure diagnoses:  1. Spondylosis without myelopathy or radiculopathy, lumbar region      Meds Administered:  Meds ordered this encounter  Medications   bupivacaine (MARCAINE) 0.5 % (with pres) injection 3 mL     Laterality: Bilateral  Location/Site: L3-L4, L2 and L3 medial branches, L4-L5, L3 and L4 medial branches, and L5-S1, L4 medial branch and L5 dorsal ramus  Needle: 5.0 in., 25 ga.  Short bevel or Quincke spinal needle  Needle Placement: Oblique pedical  Findings:   -Comments: There was excellent flow of contrast along the articular pillars without intravascular flow.  Procedure Details: The fluoroscope beam is vertically oriented in AP and then obliqued 15 to 20 degrees to the ipsilateral side of the desired nerve to achieve the "Scotty dog" appearance.  The skin over the target area of the junction of the superior articulating process and the transverse process (sacral ala if blocking the L5 dorsal rami) was locally anesthetized with a 1 ml volume of 1% Lidocaine without Epinephrine.  The spinal needle was inserted and advanced in a trajectory view down to the target.   After contact with periosteum and negative aspirate for blood and CSF, correct placement without intravascular or epidural spread was confirmed by injecting 0.5 ml. of Isovue-250.  A spot radiograph was obtained of this image.    Next, a 0.5 ml. volume of the  injectate described above was injected. The needle was then redirected to the other facet joint nerves mentioned above if needed.  Prior to the procedure, the patient was given a Pain Diary which was completed for baseline measurements.  After the procedure, the patient rated their pain every 30 minutes and will continue rating at this frequency for a total of 5 hours.  The patient has been asked to complete the Diary and return to [place] by mail, fax or hand delivered as soon as possible.   Additional Comments:  The patient tolerated the procedure well Dressing: 2 x 2 sterile gauze and Band-Aid    Post-procedure details: Patient was observed during the procedure. Post-procedure instructions were reviewed.  Patient left the clinic in stable condition.

## 2021-12-01 NOTE — Telephone Encounter (Signed)
Left message call was being returned.

## 2021-12-03 ENCOUNTER — Telehealth: Payer: Self-pay | Admitting: Hematology

## 2021-12-03 ENCOUNTER — Inpatient Hospital Stay: Payer: Medicare Other

## 2021-12-03 ENCOUNTER — Other Ambulatory Visit: Payer: Self-pay

## 2021-12-03 VITALS — BP 134/73 | HR 107 | Temp 98.3°F | Resp 18

## 2021-12-03 DIAGNOSIS — M5031 Other cervical disc degeneration,  high cervical region: Secondary | ICD-10-CM | POA: Diagnosis not present

## 2021-12-03 DIAGNOSIS — Z833 Family history of diabetes mellitus: Secondary | ICD-10-CM | POA: Diagnosis not present

## 2021-12-03 DIAGNOSIS — C50919 Malignant neoplasm of unspecified site of unspecified female breast: Secondary | ICD-10-CM

## 2021-12-03 DIAGNOSIS — C50511 Malignant neoplasm of lower-outer quadrant of right female breast: Secondary | ICD-10-CM | POA: Diagnosis not present

## 2021-12-03 DIAGNOSIS — Z170 Estrogen receptor positive status [ER+]: Secondary | ICD-10-CM | POA: Diagnosis not present

## 2021-12-03 DIAGNOSIS — I700 Atherosclerosis of aorta: Secondary | ICD-10-CM | POA: Diagnosis not present

## 2021-12-03 DIAGNOSIS — C7800 Secondary malignant neoplasm of unspecified lung: Secondary | ICD-10-CM | POA: Diagnosis not present

## 2021-12-03 DIAGNOSIS — K760 Fatty (change of) liver, not elsewhere classified: Secondary | ICD-10-CM | POA: Diagnosis not present

## 2021-12-03 DIAGNOSIS — Z801 Family history of malignant neoplasm of trachea, bronchus and lung: Secondary | ICD-10-CM | POA: Diagnosis not present

## 2021-12-03 DIAGNOSIS — M4802 Spinal stenosis, cervical region: Secondary | ICD-10-CM | POA: Diagnosis not present

## 2021-12-03 DIAGNOSIS — K219 Gastro-esophageal reflux disease without esophagitis: Secondary | ICD-10-CM | POA: Diagnosis not present

## 2021-12-03 DIAGNOSIS — Z8249 Family history of ischemic heart disease and other diseases of the circulatory system: Secondary | ICD-10-CM | POA: Diagnosis not present

## 2021-12-03 DIAGNOSIS — Z87891 Personal history of nicotine dependence: Secondary | ICD-10-CM | POA: Diagnosis not present

## 2021-12-03 DIAGNOSIS — Z8261 Family history of arthritis: Secondary | ICD-10-CM | POA: Diagnosis not present

## 2021-12-03 MED ORDER — FULVESTRANT 250 MG/5ML IM SOSY
500.0000 mg | PREFILLED_SYRINGE | Freq: Once | INTRAMUSCULAR | Status: AC
  Administered 2021-12-03: 500 mg via INTRAMUSCULAR
  Filled 2021-12-03: qty 10

## 2021-12-03 NOTE — Telephone Encounter (Signed)
Scheduled follow-up appointments per 6/27 los. Patient is aware. 

## 2021-12-07 ENCOUNTER — Encounter: Payer: Self-pay | Admitting: Hematology
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
## 2021-12-08 ENCOUNTER — Inpatient Hospital Stay: Payer: Medicare Other | Attending: Hematology

## 2021-12-08 DIAGNOSIS — Z87891 Personal history of nicotine dependence: Secondary | ICD-10-CM | POA: Insufficient documentation

## 2021-12-08 DIAGNOSIS — K760 Fatty (change of) liver, not elsewhere classified: Secondary | ICD-10-CM | POA: Insufficient documentation

## 2021-12-08 DIAGNOSIS — M4802 Spinal stenosis, cervical region: Secondary | ICD-10-CM | POA: Insufficient documentation

## 2021-12-08 DIAGNOSIS — C7800 Secondary malignant neoplasm of unspecified lung: Secondary | ICD-10-CM | POA: Insufficient documentation

## 2021-12-08 DIAGNOSIS — I700 Atherosclerosis of aorta: Secondary | ICD-10-CM | POA: Insufficient documentation

## 2021-12-08 DIAGNOSIS — Z8249 Family history of ischemic heart disease and other diseases of the circulatory system: Secondary | ICD-10-CM | POA: Insufficient documentation

## 2021-12-08 DIAGNOSIS — M5031 Other cervical disc degeneration,  high cervical region: Secondary | ICD-10-CM | POA: Insufficient documentation

## 2021-12-08 DIAGNOSIS — Z801 Family history of malignant neoplasm of trachea, bronchus and lung: Secondary | ICD-10-CM | POA: Insufficient documentation

## 2021-12-08 DIAGNOSIS — C50511 Malignant neoplasm of lower-outer quadrant of right female breast: Secondary | ICD-10-CM | POA: Insufficient documentation

## 2021-12-08 DIAGNOSIS — Z8261 Family history of arthritis: Secondary | ICD-10-CM | POA: Insufficient documentation

## 2021-12-08 DIAGNOSIS — K219 Gastro-esophageal reflux disease without esophagitis: Secondary | ICD-10-CM | POA: Insufficient documentation

## 2021-12-08 DIAGNOSIS — Z833 Family history of diabetes mellitus: Secondary | ICD-10-CM | POA: Insufficient documentation

## 2021-12-08 DIAGNOSIS — Z170 Estrogen receptor positive status [ER+]: Secondary | ICD-10-CM | POA: Insufficient documentation

## 2021-12-08 NOTE — Progress Notes (Signed)
Provided patient with list of local pantries.

## 2021-12-08 NOTE — Progress Notes (Signed)
[place] Work  Initial Assessment   Brandy [name] is a 56 y.o. year old female contacted by phone. Clinical Social Work was referred by medical provider for assessment of psychosocial needs.   SDOH (Social Determinants of Health) assessments performed: Yes SDOH Interventions    Flowsheet Row Most Recent Value  SDOH Interventions   Food Insecurity Interventions Other (Comment)  Transportation Interventions Patient Resources (Friends/Family), SCAT (Specialized Community Area Transporation)       SDOH Screenings   Alcohol Screen: Not on file  Depression (PHQ2-9): Low Risk  (07/19/2021)   Depression (PHQ2-9)    PHQ-2 Score: 0  Financial Resource Strain: Not on file  Food Insecurity: Food Insecurity Present (12/08/2021)   Hunger Vital Sign    Worried About Running Out of Food in the Last Year: Sometimes true    Ran Out of Food in the Last Year: Sometimes true  Housing: High Risk (06/16/2021)   Housing    Last Housing Risk Score: 2  Physical Activity: Not on file  Social Connections: Not on file  Stress: Not on file  Tobacco Use: Medium Risk (11/30/2021)   Patient History    Smoking Tobacco Use: Former    Smokeless Tobacco Use: Never    Passive Exposure: Not on file  Transportation Needs: Unmet Transportation Needs (12/08/2021)   PRAPARE - Hydrologist (Medical): No    Lack of Transportation (Non-Medical): Yes     Distress Screen completed: No    07/10/2020   10:03 AM  ONCBCN DISTRESS SCREENING  Screening Type Initial Screening  Distress experienced in past week (1-10) 5  Practical problem type Housing  Family Problem type Children  Referral to clinical social work Yes      Family/Social Information:  Housing Arrangement: patient lives with her husband. Family members/support persons in your life? Family, Friends, and [employer] concerns: no.  Patient has her daughter transport her or she will utilize  Surveyor, mining. Employment: Legally disabled .  Income source: Banker concerns: Yes Type of concern: Rent/ mortgage Food access concerns: yes Religious or spiritual practice: Yes-Patient stated she belonged to a church. Services Currently in place:  Food stamps  Coping/ Adjustment to diagnosis: Patient understands treatment plan and what happens next? yes Concerns about diagnosis and/or treatment: Relationship with husband or partner Patient reported stressors: Housing, Haematologist, and Programme researcher, broadcasting/film/video and/or priorities: Patient wishes to move. Patient enjoys time with family/ friends Current coping skills/ strengths: Armed forces logistics/support/administrative officer , Solicitor fund of knowledge , Religious Affiliation , and Supportive family/friends     SUMMARY: Current SDOH Barriers:  Housing barriers  Clinical Social Work Clinical Goal(s):  Explore community resource options for unmet needs related to:  Housing   Interventions: Discussed common feeling and emotions when being diagnosed with cancer, and the importance of support during treatment Informed patient of the support team roles and support services at [facility] Provided [place] contact information and encouraged patient to call with any questions or concerns Referred patient to [employer] for assistance with locating housing.   Provided the following resources via patient's email at Brandy [name]:  Low-income housing, food pantries and transportation.  Patient also received resource information from social workers in the past.   Follow Up Plan: Patient will contact CSW with any support or resource needs Patient verbalizes understanding of plan: Yes    Rodman Pickle Corry Ihnen, LCSW

## 2021-12-08 NOTE — Telephone Encounter (Signed)
Spoke with Ivin Booty at [employer]. She has been trying also to contact the patient. She has some open appointment slots for this week and will again attempt to cont the patient to come in for set up of her CPAP machine.

## 2021-12-09 ENCOUNTER — Ambulatory Visit (INDEPENDENT_AMBULATORY_CARE_PROVIDER_SITE_OTHER): Payer: Medicare Other | Admitting: Specialist

## 2021-12-09 ENCOUNTER — Encounter: Payer: Self-pay | Admitting: Specialist

## 2021-12-09 ENCOUNTER — Ambulatory Visit (INDEPENDENT_AMBULATORY_CARE_PROVIDER_SITE_OTHER): Payer: Medicare Other

## 2021-12-09 VITALS — BP 110/63 | HR 106 | Ht 67.0 in | Wt 208.0 lb

## 2021-12-09 DIAGNOSIS — R296 Repeated falls: Secondary | ICD-10-CM

## 2021-12-09 DIAGNOSIS — M47816 Spondylosis without myelopathy or radiculopathy, lumbar region: Secondary | ICD-10-CM | POA: Diagnosis not present

## 2021-12-09 DIAGNOSIS — N289 Disorder of kidney and ureter, unspecified: Secondary | ICD-10-CM | POA: Diagnosis not present

## 2021-12-09 DIAGNOSIS — R748 Abnormal levels of other serum enzymes: Secondary | ICD-10-CM | POA: Diagnosis not present

## 2021-12-09 DIAGNOSIS — M4316 Spondylolisthesis, lumbar region: Secondary | ICD-10-CM | POA: Diagnosis not present

## 2021-12-09 DIAGNOSIS — M4310 Spondylolisthesis, site unspecified: Secondary | ICD-10-CM

## 2021-12-09 DIAGNOSIS — I951 Orthostatic hypotension: Secondary | ICD-10-CM

## 2021-12-09 MED ORDER — OXYCODONE HCL 5 MG PO TABS: 5.0000 mg | ORAL_TABLET | Freq: Four times a day (QID) | ORAL | 0 refills | Status: DC | PRN

## 2021-12-09 NOTE — Progress Notes (Signed)
Office Visit Note   Patient: Brandy [name]           Date of Birth: 28-Oct-1965           MRN: [id number] Visit Date: 12/09/2021              Requested by: Sandrea Hughs, NP [address] [place],  [place] [address] PCP: Sandrea Hughs, NP   Assessment & Plan: Visit Diagnoses:  1. Spondylosis without myelopathy or radiculopathy, lumbar region   2. Spondylolisthesis of lumbar region     Plan: Avoid frequent bending and stooping  No lifting greater than 10 lbs. May use ice or moist heat for pain. Weight loss is of benefit. Best medication for lumbar disc disease is arthritis medications but you should not take these due to kidney and liver dysfunction. Exercise is important to improve your indurance and does allow people to function better inspite of back pain. Keep neurology appt as your loss of consciousness and falling may be seizure related of due to sugar or  Hypotension.    Follow-Up Instructions: No follow-ups on file.   Orders:  Orders Placed This Encounter  Procedures   XR Lumbar Spine 2-3 Views   No orders of the defined types were placed in this encounter.     Procedures: No procedures performed   Clinical Data: Findings:  Narrative & Impression CLINICAL DATA:  Spinal stenosis, lumbar. Low back pain radiating to bilateral sides/legs with numbness and weakness in bilateral legs for years. History of lumbar spine laminectomy in 2017.   EXAM: MRI LUMBAR SPINE WITHOUT CONTRAST   TECHNIQUE: Multiplanar, multisequence MR imaging of the lumbar spine was performed. No intravenous contrast was administered.   COMPARISON:  MR lumbar 01/13/2017; X-ray lumbar 08/26/2021.   FINDINGS: Segmentation:  5 lumbar vertebra   Alignment: 4 mm anterolisthesis L2-3 is unchanged. 2 mm anterolisthesis L3-4 unchanged. 4 mm retrolisthesis L4-5 and L5-S1 also unchanged.   Vertebrae: Negative for fracture or mass. Normal appearing bone marrow.   Conus  medullaris and cauda equina: Conus extends to the L2-3 level. Conus and cauda equina appear normal.   Paraspinal and other soft tissues: Negative for paraspinous mass, adenopathy, fluid collection   Disc levels:   L1-2: Minimal disc degeneration. Moderate to advanced facet degeneration. Interval development of posterior epidural synovial cyst measuring 7 x 8 mm. This flattens the posterior thecal sac but is not causing significant spinal stenosis. Generous size spinal canal.   L2-3: Anterolisthesis. Moderate to advanced facet degeneration bilaterally. No significant stenosis   L3-4: Mild anterolisthesis. Shallow right foraminal disc protrusion unchanged from the prior study. Mild disc degeneration and moderate facet degeneration. Negative for stenosis.   L4-5: Left laminotomy. Diffuse bulging of the disc and mild facet degeneration. Mild left subarticular stenosis, unchanged. Spinal canal adequate in size   L5-S1: Left laminotomy. Retrolisthesis with disc degeneration and spurring. Bilateral facet degeneration. Mild subarticular stenosis bilaterally. No interval change.   IMPRESSION: Postop laminectomy left L4-5 and L5-S1. No recurrent disc protrusion. Subarticular stenosis left L4-5 unchanged. Subarticular stenosis bilaterally L5-S1 unchanged   Multilevel facet degeneration. Interval development of 7 x 8 mm synovial cyst at L1-2 projecting into the posterior epidural space in the midline. This is not causing significant spinal stenosis.     Electronically Signed   By: Franchot Gallo M.D.   On: 09/29/2021 10:49        Subjective: Chief Complaint  Patient presents with   Lower Back -  Follow-up, Injury    56 year old female with multiple level DDD L1-2 through L5-S1 with anterolisthesis at the L3-4 and L2-3 level that is grade 1. Last MRI with degenerative disc disease and spondylosis with the anterolisthesis L3-4 and L2-3 only mild spinal stenosis changes. A  synovial cyst without significant spinal narrowing is present at L1-2 on the right side. She fell recently while getting up to walk she fell out due to feeling of diffuse weakness like her blood pressure was Low. She relates that her head and body shook after the fall with her head hitting the door where she was on the floor, she feels like she is shaking at the time after the fall and she was able to move her head away from the door and her son and his friend were able to help get her up. No bowel or bladder difficulty. 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    Review of Systems  Constitutional: Negative.   HENT: Negative.    Eyes: Negative.   Respiratory: Negative.    Cardiovascular: Negative.   Gastrointestinal: Negative.   Endocrine: Negative.   Genitourinary: Negative.   Musculoskeletal: Negative.   Skin: Negative.   Allergic/Immunologic: Negative.   Neurological: Negative.   Hematological: Negative.   Psychiatric/Behavioral: Negative.       Objective: Vital Signs: BP 110/63 (BP Location: Left Arm, Patient Position: Sitting, Cuff Size: Large)   Pulse (!) 106   Ht '5\' 7"'$  (1.702 m)   Wt 208 lb (94.3 kg)   BMI 32.58 kg/m   Physical Exam Constitutional:      Appearance: She is well-developed.  HENT:     Head: Normocephalic and atraumatic.  Eyes:     Pupils: Pupils are equal, round, and reactive to light.  Pulmonary:     Effort: Pulmonary effort is normal.     Breath sounds: Normal breath sounds.  Abdominal:     General: Bowel sounds are normal.     Palpations: Abdomen is soft.  Musculoskeletal:        General: Normal range of motion.     Cervical back: Normal range of motion and neck supple.  Skin:    General: Skin is warm and dry.  Neurological:     Mental Status: She is alert and oriented to person, place, and time.  Psychiatric:        Behavior: Behavior normal.        Thought Content: Thought content normal.        Judgment:  Judgment normal.     Back Exam   Tenderness  The patient is experiencing tenderness in the lumbar.      Specialty Comments:  MRI LUMBAR SPINE WITHOUT CONTRAST   TECHNIQUE: Multiplanar, multisequence MR imaging of the lumbar spine was performed. No intravenous contrast was administered.   COMPARISON:  MR lumbar 01/13/2017; X-ray lumbar 08/26/2021.   FINDINGS: Segmentation:  5 lumbar vertebra   Alignment: 4 mm anterolisthesis L2-3 is unchanged. 2 mm anterolisthesis L3-4 unchanged. 4 mm retrolisthesis L4-5 and L5-S1 also unchanged.   Vertebrae: Negative for fracture or mass. Normal appearing bone marrow.   Conus medullaris and cauda equina: Conus extends to the L2-3 level. Conus and cauda equina appear normal.   Paraspinal and other soft tissues: Negative for paraspinous mass, adenopathy, fluid collection   Disc levels:   L1-2: Minimal disc degeneration. Moderate to advanced facet degeneration. Interval development of posterior epidural synovial cyst measuring 7 x 8  mm. This flattens the posterior thecal sac but is not causing significant spinal stenosis. Generous size spinal canal.   L2-3: Anterolisthesis. Moderate to advanced facet degeneration bilaterally. No significant stenosis   L3-4: Mild anterolisthesis. Shallow right foraminal disc protrusion unchanged from the prior study. Mild disc degeneration and moderate facet degeneration. Negative for stenosis.   L4-5: Left laminotomy. Diffuse bulging of the disc and mild facet degeneration. Mild left subarticular stenosis, unchanged. Spinal canal adequate in size   L5-S1: Left laminotomy. Retrolisthesis with disc degeneration and spurring. Bilateral facet degeneration. Mild subarticular stenosis bilaterally. No interval change.   IMPRESSION: Postop laminectomy left L4-5 and L5-S1. No recurrent disc protrusion. Subarticular stenosis left L4-5 unchanged. Subarticular stenosis bilaterally L5-S1 unchanged    Multilevel facet degeneration. Interval development of 7 x 8 mm synovial cyst at L1-2 projecting into the posterior epidural space in the midline. This is not causing significant spinal stenosis.     Electronically Signed   By: Franchot Gallo M.D.   On: 09/29/2021 10:49  Imaging: No results found.   PMFS History: Patient Active Problem List   Diagnosis Date Noted   Spinal stenosis, 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    Priority: High    Class: Chronic   Left shoulder tendonitis 05/23/2016    Priority: High    Class: Acute   Snoring 07/14/2021   Fatigue 07/14/2021   Daytime somnolence 07/14/2021   Status post device closure of ASD 06/03/2021   Secundum ASD 06/03/2021   Decreased diffusion capacity 05/14/2021   Shortness of breath 03/04/2021   Restrictive lung disease 04/01/2020   Pulmonary nodules 04/01/2020   Elevated LFTs 03/26/2020   Primary malignant neoplasm of breast with metastasis ([place]) 02/24/2020   Spondylosis without myelopathy or radiculopathy, lumbosacral region 12/31/2019   Chronic low back pain (Bilateral) w/o sciatica 12/31/2019   Chronic hip pain (3ry area of Pain) (Bilateral) (R>L) 11/27/2019   Chronic sacroiliac joint pain (Left) 11/27/2019   Chronic pain syndrome 11/11/2019   Pharmacologic therapy 11/11/2019   Disorder of skeletal system 11/11/2019   Problems influencing health status 11/11/2019   Abnormal MRI, lumbar spine (08/22/2018) 11/11/2019   Chronic low back pain (1ry area of Pain) (Bilateral) w/ sciatica (Bilateral) 11/11/2019   DDD (degenerative disc disease), lumbosacral 11/11/2019   Grade 1 Anterolisthesis (L2-3, L3-4) 11/11/2019   Grade 1 Retrolisthesis (L4-5, L5-S1) 11/11/2019   Lumbar facet hypertrophy (Multilevel) (Bilateral) 11/11/2019   Lumbar facet syndrome (Multilevel) (Bilateral) (R>L) 11/11/2019   Chronic lower extremity pain (2ry area of Pain) (Bilateral) (R>L) 11/11/2019   Neurogenic pain 11/11/2019    Chronic musculoskeletal pain 11/11/2019   Infiltrating ductal carcinoma of breast ([facility]) 10/08/2019   Diarrhea 09/04/2019   Fatty liver 09/04/2019   Nausea & vomiting 09/04/2019   Obesity (BMI 30-39.9) 09/04/2019   GERD (gastroesophageal reflux disease)    Hyperlipidemia    CKD (chronic kidney disease), stage III (HCC)    Depression    Malignant hypertension    Transaminitis    Pneumonitis 08/04/2019   Breast cancer ([place]) 08/04/2019   Tachycardia    Ocular proptosis 02/14/2019   PVD (posterior vitreous detachment), left eye 02/14/2019   Chorioretinal scar of left eye 02/14/2019   Vitreous floaters of left eye 02/14/2019   Bilateral leg weakness 01/24/2019   Insomnia 08/02/2017   Low back pain radiating to both legs 08/02/2017   Migraine 08/02/2017   Neuropathy 08/02/2017   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 Myriam Jacobson, [facility]) 10/05/04   Cancer ([place])    Cataract    Dyspnea    GERD (gastroesophageal reflux disease)    Heart murmur    no longer heard   History of hiatal hernia    Legally blind in right eye, as defined in [place]    Lumbar herniated disc    PONV (postoperative nausea and vomiting)    Sciatica     Family History  Problem Relation Age of Onset   Arthritis Mother    Hypertension Mother    Diabetes Mother    High blood pressure Mother    Cancer Father        Lung   Colon cancer Neg Hx    Colon polyps Neg Hx    Esophageal cancer Neg Hx    Rectal cancer Neg Hx    Stomach cancer Neg Hx     Past Surgical History:  Procedure Laterality Date   ABLATION     BREAST LUMPECTOMY WITH RADIOACTIVE SEED AND SENTINEL LYMPH NODE BIOPSY Right 11/23/2020   Procedure: RIGHT BREAST LUMPECTOMY WITH RADIOACTIVE SEED AND RIGHT AXILLARY SENTINEL  LYMPH NODE BIOPSY;  Surgeon: Rolm Bookbinder, MD;  Location: [facility];  Service: General;  Laterality: Right;   BREAST SURGERY Bilateral 2011   Breast Reduction Surgery   BUNIONECTOMY     CARDIAC CATHETERIZATION     10/05/04 [facility]): LM < 25%, otherwise normal coronaries. No pulmonary HTN, Mildly enlarged RV. Secundum ASD s/p closure.   CARDIAC SURGERY     CATARACT EXTRACTION     CLEFT PALATE REPAIR     s/p cleft lip and palate repair   EYE SURGERY Right 2019   right eye removed   LUMBAR LAMINECTOMY/DECOMPRESSION MICRODISCECTOMY Left 05/23/2016   Procedure: LEFT L4-L5 LATERAL RECESS DECOMPRESSION WITH CENTRAL AND RIGHT DECOMPRESSION VIA LEFT SIDE;  Surgeon: [name], MD;  Location: [place];  Service: Orthopedics;  Laterality: Left;   LUMBAR LAMINECTOMY/DECOMPRESSION MICRODISCECTOMY Left 05/23/2016   Procedure: LUMBAR LAMINECTOMY/DECOMPRESSION MICRODISCECTOMY Lumbar five - Sacral One 1 LEVEL;  Surgeon: [name], MD;  Location: [facility];  Service: Orthopedics;  Laterality: Left;   REDUCTION MAMMAPLASTY  2011   SHOULDER INJECTION Left 05/23/2016   Procedure: SHOULDER INJECTION;  Surgeon: [name], MD;  Location: [place];  Service: Orthopedics;  Laterality: Left;  band-aid per pa-c   TRANSTHORACIC ECHOCARDIOGRAM     12/15/05 [facility]): Mild LVH, EF > 59%, grade 1 diastolic dysfunction, Trivial MR/PR/TR.   TUBAL LIGATION     Social History   Occupational History   Not on file  Tobacco Use   Smoking status: Former    Packs/day: 0.25    Years: 25.00    Total pack years: 6.25    Types: Cigarettes    Quit date: 02/04/2018    Years since quitting: 3.8   Smokeless tobacco: Never  Vaping Use   Vaping Use: Never used  Substance and Sexual Activity   Alcohol use: No   Drug use: No   Sexual activity: Not on file

## 2021-12-09 NOTE — Patient Instructions (Addendum)
Plan: Avoid frequent bending and stooping  No lifting greater than 10 lbs. May use ice or moist heat for pain. Weight loss is of benefit. Best medication for lumbar disc disease is arthritis medications but you should not take these due to kidney and liver dysfunction. Exercise is important to improve your indurance and does allow people to function better inspite of back pain. Keep neurology appt as your loss of consciousness and falling may be seizure related of due to sugar or  Hypotension. Stop tylenol of all types, sleep meds, antihistamines and cold and sinus meds.

## 2021-12-10 ENCOUNTER — Other Ambulatory Visit: Payer: Self-pay | Admitting: Ophthalmology

## 2021-12-10 DIAGNOSIS — G4733 Obstructive sleep apnea (adult) (pediatric): Secondary | ICD-10-CM | POA: Diagnosis not present

## 2021-12-10 DIAGNOSIS — H0520 Unspecified exophthalmos: Secondary | ICD-10-CM

## 2021-12-15 ENCOUNTER — Ambulatory Visit
Admission: RE | Admit: 2021-12-15 | Discharge: 2021-12-15 | Disposition: A | Discharge status: Home / Self Care | Payer: Medicare Other | Source: Ambulatory Visit | Attending: Ophthalmology | Admitting: Ophthalmology

## 2021-12-15 DIAGNOSIS — Z970 Presence of artificial eye: Secondary | ICD-10-CM | POA: Diagnosis not present

## 2021-12-15 DIAGNOSIS — H0520 Unspecified exophthalmos: Secondary | ICD-10-CM

## 2021-12-15 MED ORDER — IOPAMIDOL (ISOVUE-300) INJECTION 61%
60.0000 mL | Freq: Once | INTRAVENOUS | Status: AC | PRN
  Administered 2021-12-15: 60 mL via INTRAVENOUS

## 2021-12-16 ENCOUNTER — Ambulatory Visit: Payer: Medicare Other | Admitting: Neurology

## 2021-12-17 DIAGNOSIS — H02103 Unspecified ectropion of right eye, unspecified eyelid: Secondary | ICD-10-CM | POA: Diagnosis not present

## 2021-12-22 ENCOUNTER — Ambulatory Visit: Payer: Medicare Other | Admitting: Specialist

## 2021-12-28 ENCOUNTER — Other Ambulatory Visit: Payer: Self-pay | Admitting: Hematology

## 2021-12-28 DIAGNOSIS — C50919 Malignant neoplasm of unspecified site of unspecified female breast: Secondary | ICD-10-CM

## 2021-12-29 ENCOUNTER — Other Ambulatory Visit: Payer: Self-pay

## 2021-12-29 DIAGNOSIS — C50919 Malignant neoplasm of unspecified site of unspecified female breast: Secondary | ICD-10-CM

## 2021-12-31 ENCOUNTER — Inpatient Hospital Stay: Payer: Medicare Other

## 2021-12-31 ENCOUNTER — Other Ambulatory Visit: Payer: Self-pay

## 2021-12-31 VITALS — BP 121/71 | HR 81 | Temp 97.8°F | Resp 20

## 2021-12-31 DIAGNOSIS — Z8261 Family history of arthritis: Secondary | ICD-10-CM | POA: Diagnosis not present

## 2021-12-31 DIAGNOSIS — Z170 Estrogen receptor positive status [ER+]: Secondary | ICD-10-CM | POA: Diagnosis not present

## 2021-12-31 DIAGNOSIS — C50919 Malignant neoplasm of unspecified site of unspecified female breast: Secondary | ICD-10-CM

## 2021-12-31 DIAGNOSIS — M4802 Spinal stenosis, cervical region: Secondary | ICD-10-CM | POA: Diagnosis not present

## 2021-12-31 DIAGNOSIS — M5031 Other cervical disc degeneration,  high cervical region: Secondary | ICD-10-CM | POA: Diagnosis not present

## 2021-12-31 DIAGNOSIS — Z801 Family history of malignant neoplasm of trachea, bronchus and lung: Secondary | ICD-10-CM | POA: Diagnosis not present

## 2021-12-31 DIAGNOSIS — Z87891 Personal history of nicotine dependence: Secondary | ICD-10-CM | POA: Diagnosis not present

## 2021-12-31 DIAGNOSIS — K760 Fatty (change of) liver, not elsewhere classified: Secondary | ICD-10-CM | POA: Diagnosis not present

## 2021-12-31 DIAGNOSIS — Z8249 Family history of ischemic heart disease and other diseases of the circulatory system: Secondary | ICD-10-CM | POA: Diagnosis not present

## 2021-12-31 DIAGNOSIS — C7800 Secondary malignant neoplasm of unspecified lung: Secondary | ICD-10-CM | POA: Diagnosis not present

## 2021-12-31 DIAGNOSIS — K219 Gastro-esophageal reflux disease without esophagitis: Secondary | ICD-10-CM | POA: Diagnosis not present

## 2021-12-31 DIAGNOSIS — I700 Atherosclerosis of aorta: Secondary | ICD-10-CM | POA: Diagnosis not present

## 2021-12-31 DIAGNOSIS — Z833 Family history of diabetes mellitus: Secondary | ICD-10-CM | POA: Diagnosis not present

## 2021-12-31 DIAGNOSIS — C50511 Malignant neoplasm of lower-outer quadrant of right female breast: Secondary | ICD-10-CM | POA: Diagnosis not present

## 2021-12-31 LAB — CBC WITH DIFFERENTIAL (CANCER CENTER ONLY)
Abs Immature Granulocytes: 0.02 10*3/uL (ref 0.00–0.07)
Basophils Absolute: 0 10*3/uL (ref 0.0–0.1)
Basophils Relative: 1 %
Eosinophils Absolute: 0.1 10*3/uL (ref 0.0–0.5)
Eosinophils Relative: 1 %
HCT: 33.4 % — ABNORMAL LOW (ref 36.0–46.0)
Hemoglobin: 11.3 g/dL — ABNORMAL LOW (ref 12.0–15.0)
Immature Granulocytes: 0 %
Lymphocytes Relative: 69 %
Lymphs Abs: 4.2 10*3/uL — ABNORMAL HIGH (ref 0.7–4.0)
MCH: 35.8 pg — ABNORMAL HIGH (ref 26.0–34.0)
MCHC: 33.8 g/dL (ref 30.0–36.0)
MCV: 105.7 fL — ABNORMAL HIGH (ref 80.0–100.0)
Monocytes Absolute: 0.4 10*3/uL (ref 0.1–1.0)
Monocytes Relative: 6 %
Neutro Abs: 1.4 10*3/uL — ABNORMAL LOW (ref 1.7–7.7)
Neutrophils Relative %: 23 %
Platelet Count: 176 10*3/uL (ref 150–400)
RBC: 3.16 MIL/uL — ABNORMAL LOW (ref 3.87–5.11)
RDW: 14.8 % (ref 11.5–15.5)
WBC Count: 6.1 10*3/uL (ref 4.0–10.5)
nRBC: 0 % (ref 0.0–0.2)

## 2021-12-31 LAB — CMP (CANCER CENTER ONLY)
ALT: 49 U/L — ABNORMAL HIGH (ref 0–44)
AST: 60 U/L — ABNORMAL HIGH (ref 15–41)
Albumin: 3.7 g/dL (ref 3.5–5.0)
Alkaline Phosphatase: 282 U/L — ABNORMAL HIGH (ref 38–126)
Anion gap: 7 (ref 5–15)
BUN: 13 mg/dL (ref 6–20)
CO2: 30 mmol/L (ref 22–32)
Calcium: 9.3 mg/dL (ref 8.9–10.3)
Chloride: 102 mmol/L (ref 98–111)
Creatinine: 2.13 mg/dL — ABNORMAL HIGH (ref 0.44–1.00)
GFR, Estimated: 27 mL/min — ABNORMAL LOW (ref >60)
Glucose, Bld: 148 mg/dL — ABNORMAL HIGH (ref 70–99)
Potassium: 3.9 mmol/L (ref 3.5–5.1)
Sodium: 139 mmol/L (ref 135–145)
Total Bilirubin: 0.7 mg/dL (ref 0.3–1.2)
Total Protein: 7.4 g/dL (ref 6.5–8.1)

## 2021-12-31 MED ORDER — FULVESTRANT 250 MG/5ML IM SOSY
500.0000 mg | PREFILLED_SYRINGE | Freq: Once | INTRAMUSCULAR | Status: AC
  Administered 2021-12-31: 500 mg via INTRAMUSCULAR
  Filled 2021-12-31: qty 10

## 2021-12-31 NOTE — Progress Notes (Signed)
Pt is here for Faslodex injection, she stated she has taken all 3 premedications (Pepcid, Advil and Benadryl) Prior to appointment.

## 2022-01-04 ENCOUNTER — Ambulatory Visit
Admission: RE | Admit: 2022-01-04 | Discharge: 2022-01-04 | Disposition: A | Discharge status: Home / Self Care | Payer: Medicare Other | Source: Ambulatory Visit | Attending: Hematology | Admitting: Hematology

## 2022-01-04 DIAGNOSIS — R928 Other abnormal and inconclusive findings on diagnostic imaging of breast: Secondary | ICD-10-CM | POA: Diagnosis not present

## 2022-01-04 DIAGNOSIS — C50919 Malignant neoplasm of unspecified site of unspecified female breast: Secondary | ICD-10-CM

## 2022-01-04 DIAGNOSIS — Z853 Personal history of malignant neoplasm of breast: Secondary | ICD-10-CM | POA: Diagnosis not present

## 2022-01-10 ENCOUNTER — Ambulatory Visit: Payer: Medicare Other | Admitting: Specialist

## 2022-01-10 ENCOUNTER — Encounter: Payer: Self-pay | Admitting: Cardiology

## 2022-01-10 ENCOUNTER — Encounter: Payer: Self-pay | Admitting: Specialist

## 2022-01-10 VITALS — BP 98/62 | HR 106 | Ht 67.0 in | Wt 209.0 lb

## 2022-01-10 DIAGNOSIS — M47812 Spondylosis without myelopathy or radiculopathy, cervical region: Secondary | ICD-10-CM

## 2022-01-10 DIAGNOSIS — M4712 Other spondylosis with myelopathy, cervical region: Secondary | ICD-10-CM

## 2022-01-10 DIAGNOSIS — N289 Disorder of kidney and ureter, unspecified: Secondary | ICD-10-CM | POA: Diagnosis not present

## 2022-01-10 DIAGNOSIS — M545 Low back pain, unspecified: Secondary | ICD-10-CM | POA: Diagnosis not present

## 2022-01-10 DIAGNOSIS — M4310 Spondylolisthesis, site unspecified: Secondary | ICD-10-CM | POA: Diagnosis not present

## 2022-01-10 DIAGNOSIS — M7138 Other bursal cyst, other site: Secondary | ICD-10-CM | POA: Diagnosis not present

## 2022-01-10 DIAGNOSIS — M4316 Spondylolisthesis, lumbar region: Secondary | ICD-10-CM

## 2022-01-10 DIAGNOSIS — M47816 Spondylosis without myelopathy or radiculopathy, lumbar region: Secondary | ICD-10-CM | POA: Diagnosis not present

## 2022-01-10 DIAGNOSIS — G4733 Obstructive sleep apnea (adult) (pediatric): Secondary | ICD-10-CM | POA: Diagnosis not present

## 2022-01-10 DIAGNOSIS — M5136 Other intervertebral disc degeneration, lumbar region: Secondary | ICD-10-CM

## 2022-01-10 MED ORDER — METHOCARBAMOL 500 MG PO TABS: 500.0000 mg | ORAL_TABLET | Freq: Four times a day (QID) | ORAL | 2 refills | Status: AC | PRN

## 2022-01-10 MED ORDER — OXYCODONE HCL 5 MG PO TABS: 5.0000 mg | ORAL_TABLET | Freq: Four times a day (QID) | ORAL | 0 refills | Status: DC | PRN

## 2022-01-10 NOTE — Patient Instructions (Signed)
Avoid bending, stooping and avoid lifting weights greater than 10 lbs. Avoid prolong standing and walking. Avoid frequent bending and stooping  No lifting greater than 10 lbs. May use ice or moist heat for pain. Weight loss is of benefit. Handicap license is approved. Dr. Elwyn Lade secretary/Assistant will call to arrange for epidural steroid injection

## 2022-01-10 NOTE — Progress Notes (Signed)
Office Visit Note   Patient: Brandy [name]           Date of Birth: [birth year]           MRN: [id number] Visit Date: 01/10/2022              Requested by: Sandrea Hughs, NP [address] [place],   [id number] PCP: Sandrea Hughs, NP   Assessment & Plan: Visit Diagnoses:  1. Spondylosis without myelopathy or radiculopathy, lumbar region   2. Spondylolisthesis of lumbar region   3. Grade 1 Anterolisthesis (L2-3, L3-4)   4. Grade 1 Retrolisthesis (L4-5, L5-S1)   5. Other spondylosis with myelopathy, cervical region   6. Renal impairment   7. Degenerative disc disease, lumbar   8. Spondylosis without myelopathy or radiculopathy, cervical region   9. Low back pain, unspecified back pain laterality, unspecified chronicity, unspecified whether sciatica present   10. Synovial cyst of lumbar spine     Plan: Avoid bending, stooping and avoid lifting weights greater than 10 lbs. Avoid prolong standing and walking. Avoid frequent bending and stooping  No lifting greater than 10 lbs. May use ice or moist heat for pain. Weight loss is of benefit. Handicap license is approved. Dr. Elwyn Lade secretary/Assistant will call to arrange for epidural steroid injection    Follow-Up Instructions: No follow-ups on file.   Orders:  Orders Placed This Encounter  Procedures   Ambulatory referral to Pain Clinic   Meds ordered this encounter  Medications   methocarbamol (ROBAXIN) 500 MG tablet    Sig: Take 1 tablet (500 mg total) by mouth every 6 (six) hours as needed for muscle spasms.    Dispense:  40 tablet    Refill:  2   oxyCODONE (OXY IR/ROXICODONE) 5 MG immediate release tablet    Sig: Take 1 tablet (5 mg total) by mouth every 6 (six) hours as needed for severe pain.    Dispense:  30 tablet    Refill:  0      Procedures: No procedures performed   Clinical Data: Findings:  CLINICAL DATA:  Spinal stenosis, lumbar. Low back pain radiating to bilateral  sides/legs with numbness and weakness in bilateral legs for years. History of lumbar spine laminectomy in 2017.   EXAM: MRI LUMBAR SPINE WITHOUT CONTRAST   TECHNIQUE: Multiplanar, multisequence MR imaging of the lumbar spine was performed. No intravenous contrast was administered.   COMPARISON:  MR lumbar 01/13/2017; X-ray lumbar 08/26/2021.   FINDINGS: Segmentation:  5 lumbar vertebra   Alignment: 4 mm anterolisthesis L2-3 is unchanged. 2 mm anterolisthesis L3-4 unchanged. 4 mm retrolisthesis L4-5 and L5-S1 also unchanged.   Vertebrae: Negative for fracture or mass. Normal appearing bone marrow.   Conus medullaris and cauda equina: Conus extends to the L2-3 level. Conus and cauda equina appear normal.   Paraspinal and other soft tissues: Negative for paraspinous mass, adenopathy, fluid collection   Disc levels:   L1-2: Minimal disc degeneration. Moderate to advanced facet degeneration. Interval development of posterior epidural synovial cyst measuring 7 x 8 mm. This flattens the posterior thecal sac but is not causing significant spinal stenosis. Generous size spinal canal.   L2-3: Anterolisthesis. Moderate to advanced facet degeneration bilaterally. No significant stenosis   L3-4: Mild anterolisthesis. Shallow right foraminal disc protrusion unchanged from the prior study. Mild disc degeneration and moderate facet degeneration. Negative for stenosis.   L4-5: Left laminotomy. Diffuse bulging of the disc and mild facet degeneration. Mild left  subarticular stenosis, unchanged. Spinal canal adequate in size   L5-S1: Left laminotomy. Retrolisthesis with disc degeneration and spurring. Bilateral facet degeneration. Mild subarticular stenosis bilaterally. No interval change.   IMPRESSION: Postop laminectomy left L4-5 and L5-S1. No recurrent disc protrusion. Subarticular stenosis left L4-5 unchanged. Subarticular stenosis bilaterally L5-S1 unchanged   Multilevel  facet degeneration. Interval development of 7 x 8 mm synovial cyst at L1-2 projecting into the posterior epidural space in the midline. This is not causing significant spinal stenosis.     Electronically Signed   By: Franchot Gallo M.D.    Subjective: Chief Complaint  Patient presents with   Lower Back - Follow-up    56 year old female with collapsing degenerative disc disease with retrolisthesis upper lumbar segment L2-3 and DDD nearly every level in the lumbar spine. She is to be seen at [facility] pain management. She describes an episode for severe right leg pain and cramping discomfort into the right leg sitting and lying. The pain was keeping her from sleeping. She took oxycodone to relieve the pain.    Review of Systems  Constitutional: Negative.   HENT: Negative.    Eyes: Negative.   Respiratory: Negative.    Cardiovascular: Negative.   Gastrointestinal: Negative.   Endocrine: Negative.   Genitourinary: Negative.   Musculoskeletal: Negative.   Skin: Negative.   Allergic/Immunologic: Negative.   Neurological: Negative.   Hematological: Negative.   Psychiatric/Behavioral: Negative.       Objective: Vital Signs: BP 98/62 (BP Location: Left Arm, Patient Position: Sitting)   Pulse (!) 106   Ht '5\' 7"'$  (1.702 m)   Wt 209 lb (94.8 kg)   BMI 32.73 kg/m   Physical Exam Musculoskeletal:     Lumbar back: Negative right straight leg raise test and negative left straight leg raise test.    Back Exam   Tenderness  The patient is experiencing tenderness in the lumbar.  Range of Motion  Extension:  normal  Flexion:  normal  Lateral bend right:  normal  Lateral bend left:  normal  Rotation right:  normal  Rotation left:  normal   Muscle Strength  Right Quadriceps:  5/5  Left Quadriceps:  5/5  Right Hamstrings:  5/5  Left Hamstrings:  5/5   Tests  Straight leg raise right: negative Straight leg raise left: negative  Reflexes  Patellar:  0/4 Achilles:   0/4  Other  Toe walk: abnormal Heel walk: abnormal Sensation: decreased Gait: normal  Erythema: no back redness Scars: present  Comments:  Pain right anterolateral thigh and into the right anterior and lateral calf.     Specialty Comments:  MRI LUMBAR SPINE WITHOUT CONTRAST   TECHNIQUE: Multiplanar, multisequence MR imaging of the lumbar spine was performed. No intravenous contrast was administered.   COMPARISON:  MR lumbar 01/13/2017; X-ray lumbar 08/26/2021.   FINDINGS: Segmentation:  5 lumbar vertebra   Alignment: 4 mm anterolisthesis L2-3 is unchanged. 2 mm anterolisthesis L3-4 unchanged. 4 mm retrolisthesis L4-5 and L5-S1 also unchanged.   Vertebrae: Negative for fracture or mass. Normal appearing bone marrow.   Conus medullaris and cauda equina: Conus extends to the L2-3 level. Conus and cauda equina appear normal.   Paraspinal and other soft tissues: Negative for paraspinous mass, adenopathy, fluid collection   Disc levels:   L1-2: Minimal disc degeneration. Moderate to advanced facet degeneration. Interval development of posterior epidural synovial cyst measuring 7 x 8 mm. This flattens the posterior thecal sac but is not causing significant spinal  stenosis. Generous size spinal canal.   L2-3: Anterolisthesis. Moderate to advanced facet degeneration bilaterally. No significant stenosis   L3-4: Mild anterolisthesis. Shallow right foraminal disc protrusion unchanged from the prior study. Mild disc degeneration and moderate facet degeneration. Negative for stenosis.   L4-5: Left laminotomy. Diffuse bulging of the disc and mild facet degeneration. Mild left subarticular stenosis, unchanged. Spinal canal adequate in size   L5-S1: Left laminotomy. Retrolisthesis with disc degeneration and spurring. Bilateral facet degeneration. Mild subarticular stenosis bilaterally. No interval change.   IMPRESSION: Postop laminectomy left L4-5 and L5-S1. No  recurrent disc protrusion. Subarticular stenosis left L4-5 unchanged. Subarticular stenosis bilaterally L5-S1 unchanged   Multilevel facet degeneration. Interval development of 7 x 8 mm synovial cyst at L1-2 projecting into the posterior epidural space in the midline. This is not causing significant spinal stenosis.     Electronically Signed   By: Franchot Gallo M.D.   On: 09/29/2021 10:49  Imaging: No results found.   PMFS History: Patient Active Problem List   Diagnosis Date Noted   Spinal stenosis, 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    Priority: High    Class: Chronic   Left shoulder tendonitis 05/23/2016    Priority: High    Class: Acute   Snoring 07/14/2021   Fatigue 07/14/2021   Daytime somnolence 07/14/2021   Status post device closure of ASD 06/03/2021   Secundum ASD 06/03/2021   Decreased diffusion capacity 05/14/2021   Shortness of breath 03/04/2021   Restrictive lung disease 04/01/2020   Pulmonary nodules 04/01/2020   Elevated LFTs 03/26/2020   Primary malignant neoplasm of breast with metastasis ([place]) 02/24/2020   Spondylosis without myelopathy or radiculopathy, lumbosacral region 12/31/2019   Chronic low back pain (Bilateral) w/o sciatica 12/31/2019   Chronic hip pain (3ry area of Pain) (Bilateral) (R>L) 11/27/2019   Chronic sacroiliac joint pain (Left) 11/27/2019   Chronic pain syndrome 11/11/2019   Pharmacologic therapy 11/11/2019   Disorder of skeletal system 11/11/2019   Problems influencing health status 11/11/2019   Abnormal MRI, lumbar spine (08/22/2018) 11/11/2019   Chronic low back pain (1ry area of Pain) (Bilateral) w/ sciatica (Bilateral) 11/11/2019   DDD (degenerative disc disease), lumbosacral 11/11/2019   Grade 1 Anterolisthesis (L2-3, L3-4) 11/11/2019   Grade 1 Retrolisthesis (L4-5, L5-S1) 11/11/2019   Lumbar facet hypertrophy (Multilevel) (Bilateral) 11/11/2019   Lumbar facet syndrome (Multilevel) (Bilateral) (R>L)  11/11/2019   Chronic lower extremity pain (2ry area of Pain) (Bilateral) (R>L) 11/11/2019   Neurogenic pain 11/11/2019   Chronic musculoskeletal pain 11/11/2019   Infiltrating ductal carcinoma of breast ([place]) 10/08/2019   Diarrhea 09/04/2019   Fatty liver 09/04/2019   Nausea & vomiting 09/04/2019   Obesity (BMI 30-39.9) 09/04/2019   GERD (gastroesophageal reflux disease)    Hyperlipidemia    CKD (chronic kidney disease), stage III (HCC)    Depression    Malignant hypertension    Transaminitis    Pneumonitis 08/04/2019   Breast cancer ([place]) 08/04/2019   Tachycardia    Ocular proptosis 02/14/2019   PVD (posterior vitreous detachment), left eye 02/14/2019   Chorioretinal scar of left eye 02/14/2019   Vitreous floaters of left eye 02/14/2019   Bilateral leg weakness 01/24/2019   Insomnia 08/02/2017   Low back pain radiating to both legs 08/02/2017   Migraine 08/02/2017   Neuropathy 08/02/2017   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 Myriam Jacobson, [facility]) 10/05/04   Cancer ([place])    Cataract    Dyspnea    GERD (gastroesophageal reflux disease)    Heart murmur    no longer heard   History of hiatal hernia    Legally blind in right eye, as defined in [place]    Lumbar herniated disc    PONV (postoperative nausea and vomiting)    Sciatica     Family History  Problem Relation Age of Onset   Arthritis Mother    Hypertension Mother    Diabetes Mother    High blood pressure Mother    Cancer Father        Lung   Colon cancer Neg Hx    Colon polyps Neg Hx    Esophageal cancer Neg Hx    Rectal cancer Neg Hx    Stomach cancer Neg Hx     Past Surgical History:  Procedure Laterality Date   ABLATION     BREAST LUMPECTOMY WITH RADIOACTIVE SEED AND SENTINEL  LYMPH NODE BIOPSY Right 11/23/2020   Procedure: RIGHT BREAST LUMPECTOMY WITH RADIOACTIVE SEED AND RIGHT AXILLARY SENTINEL LYMPH NODE BIOPSY;  Surgeon: Rolm Bookbinder, MD;  Location: [place];  Service: General;  Laterality: Right;   BREAST SURGERY Bilateral 2011   Breast Reduction Surgery   BUNIONECTOMY     CARDIAC CATHETERIZATION     10/05/04 [facility]): LM < 25%, otherwise normal coronaries. No pulmonary HTN, Mildly enlarged RV. Secundum ASD s/p closure.   CARDIAC SURGERY     CATARACT EXTRACTION     CLEFT PALATE REPAIR     s/p cleft lip and palate repair   EYE SURGERY Right 2019   right eye removed   LUMBAR LAMINECTOMY/DECOMPRESSION MICRODISCECTOMY Left 05/23/2016   Procedure: LEFT L4-L5 LATERAL RECESS DECOMPRESSION WITH CENTRAL AND RIGHT DECOMPRESSION VIA LEFT SIDE;  Surgeon: [name], MD;  Location: [place];  Service: Orthopedics;  Laterality: Left;   LUMBAR LAMINECTOMY/DECOMPRESSION MICRODISCECTOMY Left 05/23/2016   Procedure: LUMBAR LAMINECTOMY/DECOMPRESSION MICRODISCECTOMY Lumbar five - Sacral One 1 LEVEL;  Surgeon: [name], MD;  Location: [place];  Service: Orthopedics;  Laterality: Left;   REDUCTION MAMMAPLASTY  2011   SHOULDER INJECTION Left 05/23/2016   Procedure: SHOULDER INJECTION;  Surgeon: [name], MD;  Location: [place];  Service: Orthopedics;  Laterality: Left;  band-aid per pa-c   TRANSTHORACIC ECHOCARDIOGRAM     12/15/05 [facility]): Mild LVH, EF > 16%, grade 1 diastolic dysfunction, Trivial MR/PR/TR.   TUBAL LIGATION     Social History   Occupational History   Not on file  Tobacco Use   Smoking status: Former    Packs/day: 0.25    Years: 25.00    Total pack years: 6.25    Types: Cigarettes    Quit date: 02/04/2018    Years since quitting: 3.9   Smokeless tobacco: Never  Vaping Use   Vaping Use: Never used  Substance and Sexual Activity   Alcohol use: No   Drug use: No   Sexual activity: Not on file

## 2022-01-11 ENCOUNTER — Ambulatory Visit
Payer: Medicare Other | Attending: Student in an Organized Health Care Education/Training Program | Admitting: Student in an Organized Health Care Education/Training Program

## 2022-01-11 ENCOUNTER — Encounter: Payer: Self-pay | Admitting: Student in an Organized Health Care Education/Training Program

## 2022-01-11 VITALS — BP 120/70 | HR 86 | Temp 97.2°F | Ht 68.0 in | Wt 209.0 lb

## 2022-01-11 DIAGNOSIS — M545 Low back pain, unspecified: Secondary | ICD-10-CM | POA: Insufficient documentation

## 2022-01-11 DIAGNOSIS — M47817 Spondylosis without myelopathy or radiculopathy, lumbosacral region: Secondary | ICD-10-CM | POA: Insufficient documentation

## 2022-01-11 DIAGNOSIS — C50919 Malignant neoplasm of unspecified site of unspecified female breast: Secondary | ICD-10-CM | POA: Diagnosis not present

## 2022-01-11 DIAGNOSIS — G8929 Other chronic pain: Secondary | ICD-10-CM | POA: Diagnosis not present

## 2022-01-11 DIAGNOSIS — M4310 Spondylolisthesis, site unspecified: Secondary | ICD-10-CM | POA: Insufficient documentation

## 2022-01-11 DIAGNOSIS — M961 Postlaminectomy syndrome, not elsewhere classified: Secondary | ICD-10-CM | POA: Diagnosis not present

## 2022-01-11 DIAGNOSIS — M47816 Spondylosis without myelopathy or radiculopathy, lumbar region: Secondary | ICD-10-CM | POA: Insufficient documentation

## 2022-01-11 DIAGNOSIS — G894 Chronic pain syndrome: Secondary | ICD-10-CM | POA: Insufficient documentation

## 2022-01-11 MED ORDER — BELBUCA 450 MCG BU FILM: 1.0000 | ORAL_FILM | Freq: Two times a day (BID) | BUCCAL | 0 refills | Status: DC

## 2022-01-11 MED ORDER — BELBUCA 300 MCG BU FILM: 1.0000 | ORAL_FILM | Freq: Two times a day (BID) | BUCCAL | 0 refills | Status: AC

## 2022-01-11 NOTE — Progress Notes (Signed)
Patient: Brandy [name] [name]  Service Category: E/M  Provider: Gillis Santa, MD  DOB: 02-Jun-1966  DOS: 01/11/2022  Referring Provider: Sandrea Hughs, NP  MRN: [id number]  Setting: Ambulatory outpatient  PCP: Sandrea Hughs, NP  Type: New Patient  Specialty: Interventional Pain Management    Location: Office  Delivery: Face-to-face     Primary Reason(s) for Visit: Encounter for initial evaluation of one or more chronic problems (new to examiner) potentially causing chronic pain, and posing a threat to normal musculoskeletal function. (Level of risk: High) CC: Back Pain (lower)  HPI  Brandy [name] is a 56 y.o. year old, female patient, who comes for the first time to our practice referred by Ngetich, Nelda Bucks, NP for our initial evaluation of her chronic pain. She has Spinal stenosis, lumbar region with neurogenic claudication; Left shoulder tendonitis; Post laminectomy syndrome; Anxiety disorder; Insomnia; Low back pain radiating to both legs; Migraine; Neuropathy; Pneumonitis; Breast cancer ([place]); Tachycardia; Diarrhea; GERD (gastroesophageal reflux disease); Hyperlipidemia; CKD (chronic kidney disease), stage III ([place]); Depression; Malignant hypertension; Transaminitis; Fatty liver; Nausea & vomiting; Obesity (BMI 30-39.9); Infiltrating ductal carcinoma of breast ([place]); Bilateral leg weakness; Ocular proptosis; PVD (posterior vitreous detachment), left eye; Chorioretinal scar of left eye; Vitreous floaters of left eye; Chronic pain syndrome; Pharmacologic therapy; Disorder of skeletal system; Problems influencing health status; Abnormal MRI, lumbar spine (08/22/2018); Chronic low back pain (1ry area of Pain) (Bilateral) w/ sciatica (Bilateral); DDD (degenerative disc disease), lumbosacral; Grade 1 Anterolisthesis (L2-3, L3-4); Grade 1 Retrolisthesis (L4-5, L5-S1); Lumbar facet hypertrophy (Multilevel) (Bilateral); Lumbar facet syndrome (Multilevel) (Bilateral) (R>L); Chronic lower  extremity pain (2ry area of Pain) (Bilateral) (R>L); Neurogenic pain; Chronic musculoskeletal pain; Chronic hip pain (3ry area of Pain) (Bilateral) (R>L); Chronic sacroiliac joint pain (Left); Spondylosis without myelopathy or radiculopathy, lumbosacral region; Chronic low back pain (Bilateral) w/o sciatica; Primary malignant neoplasm of breast with metastasis ([place]); Elevated LFTs; Restrictive lung disease; Pulmonary nodules; Shortness of breath; Decreased diffusion capacity; Status post device closure of ASD; Secundum ASD; Snoring; Fatigue; and Daytime somnolence on their problem list. Today she comes in for evaluation of her Back Pain (lower)  Pain Assessment: Location: Left, Right Back Radiating: pain radiaties down to her to both thigh Onset: More than a month ago Duration: Chronic pain Quality: Burning, Aching, Constant, Throbbing, Stabbing, Sharp Severity: 10-Worst pain ever/10 (subjective, self-reported pain score)  Effect on ADL: limits my daily activities Timing: Constant Modifying factors: Meds BP: 120/70  HR: 86  Onset and Duration: Gradual and Present longer than 3 months Cause of pain: Unknown Severity: Getting worse, NAS-11 at its worse: 10/10, NAS-11 at its best: 5/10, NAS-11 now: 7/10, and NAS-11 on the average: 7/10 Timing: Not influenced by the time of the day Aggravating Factors: Bending, Climbing, Kneeling, Lifiting, Motion, Prolonged sitting, Prolonged standing, Squatting, Stooping , Twisting, Walking, Walking uphill, and Working Alleviating Factors: Stretching, Nerve blocks, and Resting Associated Problems: Day-time cramps, Night-time cramps, Depression, Dizziness, Fatigue, Numbness, Spasms, Tingling, and Pain that wakes patient up Quality of Pain: Aching, Agonizing, Burning, Constant, Cramping, Disabling, Exhausting, Nagging, Stabbing, Tender, and Uncomfortable Previous Examinations or Tests: Bone scan, CT scan, Ct-Myelogram, MRI scan, Nerve block, and X-rays Previous  Treatments: Chiropractic manipulations, Epidural steroid injections, Narcotic medications, Physical Therapy, Steroid treatments by mouth, and Stretching exercises  Brandy [name] is a pleasant 56 year old female who presents with a chief complaint of low back pain with radiation down to her posterior buttocks and thighs.  She has a history of lumbar  postlaminectomy pain syndrome.  Of note I have seen her in the past, almost 2 years ago for a therapeutic bilateral L2, L3, L4, L5 facet medial branch nerve block.  She obtained 100% pain relief from this however 6 months after where she sustained a fall which increased her low back pain.  She was unable to get back into the  pain clinic due to personal issues.  She states that she has been seen by physical medicine and rehab physician.  She has also been evaluated by orthopedic spine.  She is currently on oxycodone 2.5 to 5 mg daily as needed for breakthrough pain.  She follows up today hoping that she can repeat her bilateral L2, L3, L4, L5 medial branch nerve block that was done back in 03/02/2020.  Future considerations after this would include lumbar radiofrequency ablation.  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  In regards to medication management in the context of her obstructive sleep apnea and the fact that she is on chronic benzodiazepine therapy with Xanax.  I informed her that she would only be a candidate for buprenorphine by way of belbuca.  Risk and benefits were reviewed of this medication.  We will obtain a baseline urine toxicology screen, and I will initiate her belbuca at 300 mcg twice daily for the first 4 weeks and then titration instructions to 450 mcg twice daily.   Historic Controlled Substance Pharmacotherapy Review  PMP and historical list of controlled substances:   12/10/2021  12/09/2021   3  Oxycodone Hcl (Ir) 5 Mg Tablet 30.00  7   [place]  [id number]   [place] (8641)  0/0  32.14 MME  Medicare  [place]     Historical Monitoring: The patient  reports no history of drug use. List of prior UDS Testing: Lab Results  Component Value Date   COCAINSCRNUR NONE DETECTED 09/04/2019   THCU NONE DETECTED 09/04/2019   Historical Background Evaluation: [place] PMP: PDMP not reviewed this encounter. Review of the past 68-month conducted.              [place] Department of public safety, offender search: (Editor, commissioningInformation) Non-contributory Risk Assessment Profile: Aberrant behavior: None observed or detected today Risk factors for fatal opioid overdose: Benzodiazepine use and sleep apnea Fatal overdose hazard ratio (HR): Calculation deferred Non-fatal overdose hazard ratio (HR): Calculation deferred Risk of opioid abuse or dependence: 0.7-3.0% with doses ? 36 MME/day and 6.1-26% with doses ? 120 MME/day. Substance use disorder (SUD) risk level: Low   Pharmacologic Plan:  Belbuca trial only              Meds   Current Outpatient Medications:    albuterol (PROVENTIL) (2.5 MG/3ML) 0.083% nebulizer solution, Take 3 mLs (2.5 mg total) by nebulization every 6 (six) hours as needed for wheezing or shortness of breath., Disp: 180 mL, Rfl: 1   ALPRAZolam (XANAX) 1 MG tablet, TAKE 1 TABLET BY MOUTH AT  BEDTIME, Disp: 30 tablet, Rfl: 5   amitriptyline (ELAVIL) 10 MG tablet, Take 1 tablet (10 mg total) by mouth at bedtime., Disp: 30 tablet, Rfl: 3   Buprenorphine HCl (BELBUCA) 300 MCG FILM, Place 1 Film inside cheek every 12 (twelve) hours., Disp: 60 each, Rfl: 0   [START ON 02/10/2022] Buprenorphine HCl (BELBUCA) 450 MCG FILM, Place 1 Film (450 mcg total) inside cheek every 12 (twelve) hours., Disp: 60 each, Rfl: 0   Carboxymethylcellul-Glycerin (LUBRICATING EYE  DROPS OP), Place 1 drop into the left eye 4 (four) times daily., Disp: , Rfl:    diphenoxylate-atropine (LOMOTIL) 2.5-0.025 MG tablet, Take 1 tablet by mouth 3 (three) times daily as needed for  diarrhea or loose stools., Disp: 30 tablet, Rfl: 0   fluticasone-salmeterol (WIXELA INHUB) 250-50 MCG/ACT AEPB, Inhale 1 puff into the lungs in the morning and at bedtime., Disp: 60 each, Rfl: 1   ketoconazole (NIZORAL) 2 % cream, SMARTSIG:1 Application Topical 1 to 2 Times Daily, Disp: , Rfl:    loperamide (IMODIUM) 2 MG capsule, Take 1-2 capsules (2-4 mg total) by mouth 4 (four) times daily as needed for diarrhea or loose stools., Disp: 30 capsule, Rfl: 1   magnesium oxide (MAG-OX) 400 MG tablet, Take 1 tablet (400 mg total) by mouth 2 (two) times daily., Disp: 14 tablet, Rfl: 0   methocarbamol (ROBAXIN) 500 MG tablet, Take 1 tablet (500 mg total) by mouth every 6 (six) hours as needed for muscle spasms., Disp: 40 tablet, Rfl: 2   midodrine (PROAMATINE) 10 MG tablet, Take 1 tablet (10 mg total) by mouth 3 (three) times daily., Disp: 270 tablet, Rfl: 3   Multiple Vitamin (MULTIVITAMIN WITH MINERALS) TABS tablet, Take 1 tablet by mouth daily., Disp: , Rfl:    omeprazole (PRILOSEC OTC) 20 MG tablet, Take 20 mg by mouth daily., Disp: , Rfl:    ondansetron (ZOFRAN) 8 MG tablet, TAKE 1 TABLET(8 MG) BY MOUTH EVERY 8 HOURS AS NEEDED FOR NAUSEA OR VOMITING, Disp: 30 tablet, Rfl: 3   oxyCODONE (OXY IR/ROXICODONE) 5 MG immediate release tablet, Take 1 tablet (5 mg total) by mouth every 6 (six) hours as needed for severe pain., Disp: 30 tablet, Rfl: 0   triamcinolone cream (KENALOG) 0.1 %, SMARTSIG:1 Application Topical 2-3 Times Daily, Disp: , Rfl:    VERZENIO 100 MG tablet, Take 1 tablet (100 mg total) by mouth 2 (two) times daily. Swallow tablets whole. Do not chew, crush, or split tablets before swallowing., Disp: 56 tablet, Rfl: 0   Vitamin D, Ergocalciferol, (DRISDOL) 1.25 MG (50000 UNIT) CAPS capsule, TAKE 1 CAPSULE ONCE PER WEEK, Disp: 12 capsule, Rfl: 3   White Petrolatum-Mineral Oil (LUBRICANT EYE NIGHTTIME) OINT, Place 1 drop into the left eye at bedtime., Disp: , Rfl:    rosuvastatin (CRESTOR) 10 MG  tablet, Take 1 tablet (10 mg total) by mouth daily., Disp: 90 tablet, Rfl: 3  Imaging Review  Cervical Imaging: Cervical MR wo contrast: Results for orders placed during the hospital encounter of 09/13/21  MR Cervical Spine w/o contrast  Narrative CLINICAL DATA:  Frequent falls. Chronic neck pain. History of breast cancer.  EXAM: MRI CERVICAL SPINE WITHOUT CONTRAST  TECHNIQUE: Multiplanar, multisequence MR imaging of the cervical spine was performed. No intravenous contrast was administered.  COMPARISON:  None.  FINDINGS: Alignment: Straightening of the normal cervical lordosis. Grade 1 retrolisthesis of C3 on C4 and grade 1 anterolisthesis of C4 on C5.  Vertebrae: No fracture, suspicious marrow lesion, or significant marrow edema.  Cord: Normal signal and morphology.  Posterior Fossa, vertebral arteries, paraspinal tissues: Posterior fossa more fully evaluated on the concurrent head MRI. Preserved vertebral artery flow voids.  Disc levels:  C2-3: Right uncovertebral spurring and severe right facet arthrosis result in mild right neural foraminal stenosis without spinal stenosis.  C3-4: Broad-based posterior disc osteophyte complex and mild right and severe left facet arthrosis result in mild spinal stenosis and mild left neural foraminal stenosis. Left facet ankylosis.  C4-5: Broad-based  posterior disc osteophyte complex and severe right greater than left facet arthrosis result in mild spinal stenosis and moderate right neural foraminal stenosis.  C5-6: Broad-based posterior disc osteophyte complex and severe right and moderate left facet arthrosis result in mild right neural foraminal stenosis without spinal stenosis. Right facet ankylosis.  C6-7: Disc bulging, uncovertebral spurring, and severe facet arthrosis result in mild-to-moderate right and mild left neural foraminal stenosis without spinal stenosis.  C7-T1: Moderate right and mild left facet arthrosis  without stenosis.  IMPRESSION: 1. Diffuse cervical disc degeneration and advanced facet arthrosis with mild spinal stenosis at C3-4 and C4-5. 2. Moderate right neural foraminal stenosis at C4-5. 3. No evidence of metastatic disease in the cervical spine.   Electronically Signed By: Logan Bores M.D. On: 09/14/2021 08:16  MR Shoulder Left w/o contrast  Narrative CLINICAL DATA:  Shoulder pain for 6 months. No known injury or prior relevant surgery.  EXAM: MRI OF THE LEFT SHOULDER WITHOUT CONTRAST  TECHNIQUE: Multiplanar, multisequence MR imaging of the shoulder was performed. No intravenous contrast was administered.  COMPARISON:  Radiographs 07/18/2016.  FINDINGS: Rotator cuff:  Intact without significant tendinosis.  Muscles: There is some T2 hyperintensity within the deltoid muscle posterolateral to the acromion. The patient denies shoulder injection, and this may reflect a muscular strain. There is no abnormal signal within the rotator cuff musculature.  Biceps long head:  Intact and normally positioned.  Acromioclavicular Joint: The acromion is type 1. There are mild acromioclavicular degenerative changes. There is a small amount of fluid in the subacromial -subdeltoid bursa.  Glenohumeral Joint: No significant shoulder joint effusion or glenohumeral arthropathy.  Labrum:  No evidence of labral tear.  Bones: No acute or significant extra-articular osseous findings.  Other: No significant soft tissue findings.  IMPRESSION: 1. Probable posterior deltoid muscle strain with adjacent subacromial -subdeltoid bursitis. 2. The rotator cuff, labrum and biceps tendon appear normal.   Electronically Signed By: Richardean Sale M.D. On: 07/26/2016 16:23  Thoracic Imaging: Thoracic MR wo contrast: Results for orders placed during the hospital encounter of 09/13/21  MR Thoracic Spine w/o contrast  Narrative CLINICAL DATA:  Frequent falls.  History of breast  cancer.  EXAM: MRI THORACIC SPINE WITHOUT CONTRAST  TECHNIQUE: Multiplanar, multisequence MR imaging of the thoracic spine was performed. No intravenous contrast was administered.  COMPARISON:  Chest CTA 07/12/2021  FINDINGS: Alignment:  Normal.  Vertebrae: No fracture, suspicious marrow lesion, or significant marrow edema.  Cord:  Normal signal.  Paraspinal and other soft tissues: Unremarkable.  Disc levels:  Minimal disc bulging and endplate spurring throughout much of the thoracic spine as well as a small left central disc protrusion at T10-11. No spinal stenosis or significant spinal cord mass effect. Moderate facet arthrosis in the upper and lower thoracic spine resulting in mild neural foraminal stenosis, for example at T2-3, T3-4, and T10-11.  IMPRESSION: 1. No evidence of metastatic disease in the thoracic spine. 2. Mild disc and moderate facet degeneration without spinal stenosis or high-grade neural foraminal stenosis.   Electronically Signed By: Logan Bores M.D. On: 09/14/2021 08:02 Narrative Clinical Data: Increased activity on bone scan to the left in the region of T9-10, back pain  THORACOLUMBAR SPINE - 2 VIEW  Comparison: Nuclear medicine bone scan of today  Findings: Only mild degenerative changes noted which may account for the faint increased activity in the lower thoracic spine in the region of T9-10 on the left.  The no lytic or blastic lesion is seen  and the pedicles appear normal.  IMPRESSION: The small focus of activity to the left at T9-10 on today's bone scan is most likely due to mild degenerative change.  No other bony abnormality is seen.  Provider: Pearla Dubonnet    MR Lumbar Spine w/o contrast  Narrative CLINICAL DATA:  Spinal stenosis, lumbar. Low back pain radiating to bilateral sides/legs with numbness and weakness in bilateral legs for years. History of lumbar spine laminectomy in 2017.  EXAM: MRI LUMBAR SPINE WITHOUT  CONTRAST  TECHNIQUE: Multiplanar, multisequence MR imaging of the lumbar spine was performed. No intravenous contrast was administered.  COMPARISON:  MR lumbar 01/13/2017; X-ray lumbar 08/26/2021.  FINDINGS: Segmentation:  5 lumbar vertebra  Alignment: 4 mm anterolisthesis L2-3 is unchanged. 2 mm anterolisthesis L3-4 unchanged. 4 mm retrolisthesis L4-5 and L5-S1 also unchanged.  Vertebrae: Negative for fracture or mass. Normal appearing bone marrow.  Conus medullaris and cauda equina: Conus extends to the L2-3 level. Conus and cauda equina appear normal.  Paraspinal and other soft tissues: Negative for paraspinous mass, adenopathy, fluid collection  Disc levels:  L1-2: Minimal disc degeneration. Moderate to advanced facet degeneration. Interval development of posterior epidural synovial cyst measuring 7 x 8 mm. This flattens the posterior thecal sac but is not causing significant spinal stenosis. Generous size spinal canal.  L2-3: Anterolisthesis. Moderate to advanced facet degeneration bilaterally. No significant stenosis  L3-4: Mild anterolisthesis. Shallow right foraminal disc protrusion unchanged from the prior study. Mild disc degeneration and moderate facet degeneration. Negative for stenosis.  L4-5: Left laminotomy. Diffuse bulging of the disc and mild facet degeneration. Mild left subarticular stenosis, unchanged. Spinal canal adequate in size  L5-S1: Left laminotomy. Retrolisthesis with disc degeneration and spurring. Bilateral facet degeneration. Mild subarticular stenosis bilaterally. No interval change.  IMPRESSION: Postop laminectomy left L4-5 and L5-S1. No recurrent disc protrusion. Subarticular stenosis left L4-5 unchanged. Subarticular stenosis bilaterally L5-S1 unchanged  Multilevel facet degeneration. Interval development of 7 x 8 mm synovial cyst at L1-2 projecting into the posterior epidural space in the midline. This is not causing  significant spinal stenosis.   Electronically Signed By: Franchot Gallo M.D. On: 09/29/2021 10:49  MR Lumbar Spine W Wo Contrast  Narrative CLINICAL DATA:  Intermittent back pain for 5 years, bilateral lower extremity burning and numbness. Follow-up radiculopathy.  EXAM: MRI LUMBAR SPINE WITHOUT AND WITH CONTRAST  TECHNIQUE: Multiplanar and multiecho pulse sequences of the lumbar spine were obtained without and with intravenous contrast.  CONTRAST:  105m MULTIHANCE GADOBENATE DIMEGLUMINE 529 MG/ML IV SOLN  COMPARISON:  MRI of the lumbar spine March 06, 2016 and lumbar spine radiographs August 25, 2016  FINDINGS: SEGMENTATION: For the purposes of this report, the last well-formed intervertebral disc will be reported as L5-S1.  ALIGNMENT: Straightened lumbar lordosis. Minimal grade 1 L2-3 anterolisthesis. Minimal grade 1 L4-5 retrolisthesis. Grade 1 L5-S1 retrolisthesis.  VERTEBRAE:Vertebral bodies are intact. Moderate to severe L5-S1 disc height loss, progressed from prior MRI. Mild desiccation lower lumbar discs. Mild chronic discogenic endplate changes LE7-N1 Similar low T1, bright STIR signal single segment upper coccyx, without definite enhancement though, not tailored for evaluation. No abnormal or acute lumbar spine bone marrow signal or enhancement. No abnormal disc enhancement.  CONUS MEDULLARIS: Conus medullaris terminates at L1-2 and demonstrates normal morphology and signal characteristics. Cauda equina is normal. No abnormal cord, leptomeningeal or epidural enhancement.  PARASPINAL AND SOFT TISSUES: Included prevertebral and paraspinal soft tissues are nonacute. Moderate LEFT sacral paraspinal denervation/atrophy.  DISC LEVELS:  T12-L1, L1-2:  No disc bulge, canal stenosis nor neural foraminal narrowing. Mild to moderate facet arthropathy.  L2-3: Anterolisthesis. Unroofing of the disc without disc bulge. 4 mm RIGHT facet synovial cyst decreased from 9  mm, decreased fluid component. Moderate to severe RIGHT, moderate LEFT facet arthropathy and ligamentum flavum redundancy without canal stenosis or neural foraminal narrowing.  L3-4: Small broad-based RIGHT extraforaminal disc protrusion unchanged. Moderate to severe facet arthropathy without canal stenosis or neural foraminal narrowing.  L4-5: Retrolisthesis. LEFT hemilaminectomy. Homogeneous enhancing granulation tissue within the surgical bed, no fluid collection. Small LEFT subarticular disc protrusion and annular fissure persists. No canal stenosis. Mild LEFT neural foraminal narrowing.  L5-S1: Bilateral L5-S1 laminectomies. Homogeneous enhancing granulation tissue within the surgical bed, no focal fluid collection. Mild facet arthropathy. No canal stenosis. Mild LEFT neural foraminal narrowing.  IMPRESSION: 1. Status post LEFT L4-5 and bilateral L5-S1 laminectomies. 2. Grade 1 L2-3 anterolisthesis, grade 1 L4-5 retrolisthesis, grade 1 L5-S1 retrolisthesis. 3. No canal stenosis. Mild LEFT L4-5 and L5-S1 neural foraminal narrowing. 4. Similar abnormal upper coccyx bone marrow signal, nonspecific and incompletely characterized.   Electronically Signed By: Elon Alas M.D. On: 01/14/2017 00:23  DG Lumbar Spine 1 View  Narrative CLINICAL DATA:  Left L4-5 decompression.  EXAM: LUMBAR SPINE - 1 VIEW  COMPARISON:  05/23/2016 intraoperative lumbar spine radiographs .  FINDINGS: There is a posterior approach surgical marking device with the tip overlying the spinal canal at the level of the L5-S1 disc. Additional surgical instruments overlie the posterior lower back soft tissues at the L4-S1 levels.  IMPRESSION: Posterior approach surgical marking device position as described.   Electronically Signed By: Ilona Sorrel M.D. On: 05/23/2016 10:57  Lumbar DG 1V (Clearing): No results found for this or any previous visit.  Lumbar DG 2-3V (Clearing): No results  found for this or any previous visit.  Lumbar DG 2-3 views: Results for orders placed during the hospital encounter of 05/23/16  DG Lumbar Spine 2-3 Views  Narrative CLINICAL DATA:  Intraoperative localization for L5-S1 decompression.  EXAM: LUMBAR SPINE - 2-3 VIEW  COMPARISON:  Lumbar spine MRI on 03/06/2016  FINDINGS: Intraoperative crosstable lateral 1 shows a probe overlying the posterior interspinous space at L4-5.  Intraoperative cross-table lateral 2 shows posterior retractors with a probe overlying the spinal canal at the level L4-5.  IMPRESSION: Intraoperative localization of L4-5.  These results were called by telephone at the time of interpretation on 05/23/2016 at 10:38 am to the [facility], who verbally acknowledged these results and confirmed that Dr. Louanne Skye was aware these results.   Electronically Signed By: Earle Gell M.D. On: 05/23/2016 10:39  Lumbar DG (Complete) 4+V: Results for orders placed in visit on 10/01/12  DG Lumbar Spine Complete  Narrative *RADIOLOGY REPORT*  Clinical Data: 55 year old female with low back pain.  LUMBAR SPINE - COMPLETE 4+ VIEW  Comparison: Nuclear medicine whole body bone scan 12/05/2007. Thoracic radiographs 12/05/2007.  Findings: Normal lumbar segmentation.  Mild retrolisthesis of L5 on S1.  Mild to moderate disc space loss at that level and endplate spurring.  Trace retrolisthesis of L4 on L5.  Otherwise normal lumbar vertebral height and alignment.  The other disc spaces are relatively preserved.  No pars fracture.  Grossly intact lower thoracic levels.  Sacral ala and SI joints within normal limits.  IMPRESSION: Chronic disc and endplate degeneration at L5-S1 with mild retrolisthesis. No acute osseous abnormality in the lumbar spine.   Original Report Authenticated By: Roselyn Reef, M.D.  Narrative CLINICAL DATA:  Chronic low back pain.  No reported injury.  EXAM: LUMBAR SPINE - COMPLETE WITH BENDING  VIEWS  COMPARISON:  08/25/2016 lumbar spine radiographs.  FINDINGS: This report assumes 5 non rib-bearing lumbar vertebrae.  Lumbar vertebral body heights are preserved, with no fracture.  Mild-to-moderate multilevel lumbar degenerative disc disease, most prominent at L5-S1. There is 3 mm anterolisthesis at L2-3 on the neutral view, which measures 3 mm with flexion and extension. There is 3 mm anterolisthesis at L3-4 on the neutral view, which measures 4 mm with flexion and 2 mm with extension. There is 3 mm retrolisthesis at L4-5, which measures 3 mm with flexion and extension. Mild bilateral lumbar facet arthropathy, most prominent in the mid lumbar spine. No aggressive appearing focal osseous lesions.  IMPRESSION: 1. Mild-to-moderate multilevel lumbar degenerative disc disease, most prominent at L5-S1. 2. Mild multilevel lumbar facet arthropathy. 3. Mild multilevel lumbar spondylolisthesis as detailed, with slight dynamic instability at L3-4.   Electronically Signed By: Ilona Sorrel M.D. On: 11/28/2019 16:50   Sacroiliac Joint Imaging: Sacroiliac Joint DG: Results for orders placed during the hospital encounter of 11/27/19  DG Si Joints  Narrative CLINICAL DATA:  Chronic low back and bilateral hip pain. No reported injury.  EXAM: BILATERAL SACROILIAC JOINTS - 3+ VIEW  COMPARISON:  None.  FINDINGS: The sacroiliac joint spaces are maintained and there is no evidence of arthropathy. Mild spondylosis noted in the visualized lower lumbar spine. No suspicious focal osseous lesions.  IMPRESSION: No evidence of sacroiliac arthropathy. Mild spondylosis in the visualized lower lumbar spine.   Electronically Signed By: Ilona Sorrel M.D. On: 11/28/2019 16:26  Narrative CLINICAL DATA:  Chronic low back and right hip pain. No reported injury.  EXAM: DG HIP (WITH OR WITHOUT PELVIS) 2-3V RIGHT  COMPARISON:  None.  FINDINGS: No pelvic fracture or diastasis. No  right hip fracture or dislocation. No suspicious focal osseous lesions. Tiny marginal osteophytes at the inferior right humeral head and superolateral acetabular margin. Mild degenerative changes in the visualized lower lumbar spine.  IMPRESSION: Minimal right hip osteoarthritis.   Electronically Signed By: Ilona Sorrel M.D. On: 11/28/2019 16:23   Narrative CLINICAL DATA:  Chronic low back and bilateral hip pain. No reported injury.  EXAM: DG HIP (WITH OR WITHOUT PELVIS) 2-3V LEFT  COMPARISON:  None.  FINDINGS: No left hip fracture or dislocation. No suspicious focal osseous lesions. No significant left hip arthropathy. No radiopaque foreign bodies.  IMPRESSION: No significant left hip arthropathy.  No acute osseous abnormality.   Electronically Signed By: Ilona Sorrel M.D. On: 11/28/2019 16:24 DG Knee Complete 4 Views Left  Narrative CLINICAL DATA:  Pain in left knee 6 days after fall; pt states that she fell due to ice on the ground, fell onto her left knee and felt the knee popped; no previous injury; pain on both lateral and medial sides of the knee, pain radiates to hip and ankle sometime but not much per pt  EXAM: LEFT KNEE - COMPLETE 4+ VIEW  COMPARISON:  None.  FINDINGS: No fracture or dislocation.  There are no significant arthropathic changes.  No joint effusion.  Soft tissues are unremarkable.  IMPRESSION: Negative.   Electronically Signed By: Lajean Manes M.D. On: 07/06/2014 10:53   Complexity Note: Imaging results reviewed.                         ROS  Cardiovascular: Heart surgery and Heart murmur  Pulmonary or Respiratory: Lung problems, Shortness of breath, Snoring , and Temporary stoppage of breathing during sleep Neurological: No reported neurological signs or symptoms such as seizures, abnormal skin sensations, urinary and/or fecal incontinence, being born with an abnormal open spine and/or a tethered spinal  cord Psychological-Psychiatric: Anxiousness and Depressed Gastrointestinal: Heartburn due to stomach pushing into lungs (Hiatal hernia) Genitourinary: Kidney disease Hematological: Weakness due to low blood hemoglobin or red blood cell count (Anemia) and Low platelet levels (Thrombocytopenia) Endocrine: No reported endocrine signs or symptoms such as high or low blood sugar, rapid heart rate due to high thyroid levels, obesity or weight gain due to slow thyroid or thyroid disease Rheumatologic: No reported rheumatological signs and symptoms such as fatigue, joint pain, tenderness, swelling, redness, heat, stiffness, decreased range of motion, with or without associated rash Musculoskeletal: Negative for myasthenia gravis, muscular dystrophy, multiple sclerosis or malignant hyperthermia Work History: Disabled  Allergies  Ms. [facility] is allergic to zithromax [azithromycin], other, and psyllium.  Laboratory Chemistry Profile   Renal Lab Results  Component Value Date   BUN 13 12/31/2021   CREATININE 2.13 (H) 12/31/2021   LABCREA 123.4 10/27/2021   BCR 12 07/28/2021   GFRAA >60 02/24/2020   GFRNONAA 27 (L) 12/31/2021   PROTEINUR NEGATIVE 09/04/2019     Electrolytes Lab Results  Component Value Date   NA 139 12/31/2021   K 3.9 12/31/2021   CL 102 12/31/2021   CALCIUM 9.3 12/31/2021   MG 1.5 (L) 06/14/2021     Hepatic Lab Results  Component Value Date   AST 60 (H) 12/31/2021   ALT 49 (H) 12/31/2021   ALBUMIN 3.7 12/31/2021   ALKPHOS 282 (H) 12/31/2021   LIPASE 35 07/12/2021     ID Lab Results  Component Value Date   HIV NON REACTIVE 09/05/2019   SARSCOV2NAA NEGATIVE 07/12/2021   STAPHAUREUS NEGATIVE 05/20/2016   MRSAPCR NEGATIVE 05/20/2016     Bone Lab Results  Component Value Date   VD25OH 30.51 06/26/2019   VD125OH2TOT 62 05/28/2021   HT3428JG8 <8 05/28/2021   TL5726OM3 62 05/28/2021   25OHVITD1 50 11/27/2019   25OHVITD2 46 11/27/2019   25OHVITD3  4.1 11/27/2019     Endocrine Lab Results  Component Value Date   GLUCOSE 148 (H) 12/31/2021   GLUCOSEU NEGATIVE 09/04/2019   TSH 0.91 05/28/2021     Neuropathy Lab Results  Component Value Date   TDHRCBUL84 536 10/26/2021   HIV NON REACTIVE 09/05/2019     CNS No results found for: "COLORCSF", "APPEARCSF", "RBCCOUNTCSF", "WBCCSF", "POLYSCSF", "LYMPHSCSF", "EOSCSF", "PROTEINCSF", "GLUCCSF", "JCVIRUS", "CSFOLI", "IGGCSF", "LABACHR", "ACETBL"   Inflammation (CRP: Acute  ESR: Chronic) Lab Results  Component Value Date   CRP 16 (H) 11/27/2019   ESRSEDRATE 22 11/27/2019   LATICACIDVEN 2.2 (HH) 04/21/2020     Rheumatology Lab Results  Component Value Date   RF <14 12/29/2016   ANA NEGATIVE 06/27/2017   LABURIC 6.5 12/29/2016     Coagulation Lab Results  Component Value Date   INR 1.0 07/12/2021   LABPROT 12.8 07/12/2021   APTT 33 05/20/2016   PLT 176 12/31/2021   DDIMER 1.11 (H) 08/04/2019     Cardiovascular Lab Results  Component Value Date   BNP 7.5 11/01/2019   CKTOTAL 77 06/04/2009   CKMB 1.1 06/04/2009   TROPONINI <0.03 02/22/2015   HGB 11.3 (L) 12/31/2021   HCT 33.4 (L) 12/31/2021     Screening Lab Results  Component Value Date   [place] NEGATIVE 07/12/2021  STAPHAUREUS NEGATIVE 05/20/2016   MRSAPCR NEGATIVE 05/20/2016   HIV NON REACTIVE 09/05/2019     Cancer No results found for: "CEA", "CA125", "LABCA2"   Allergens No results found for: "ALMOND", "APPLE", "ASPARAGUS", "AVOCADO", "BANANA", "BARLEY", "BASIL", "BAYLEAF", "GREENBEAN", "LIMABEAN", "WHITEBEAN", "BEEFIGE", "REDBEET", "BLUEBERRY", "BROCCOLI", "CABBAGE", "MELON", "CARROT", "CASEIN", "CASHEWNUT", "CAULIFLOWER", "CELERY"     Note: Lab results reviewed.  [place]  Drug: Brandy [name]  reports no history of drug use. Alcohol:  reports no history of alcohol use. Tobacco:  reports that she quit smoking about 3 years ago. Her smoking use included cigarettes. She has a 6.25  pack-year smoking history. She has never used smokeless tobacco. Medical:  has a past medical history of Acute pansinusitis (08/02/2017), Anxiety, Arthritis, ASD (atrial septal defect), Cancer ([place]), Cataract, Dyspnea, GERD (gastroesophageal reflux disease), Heart murmur, History of hiatal hernia, Legally blind in right eye, as defined in [place], Lumbar herniated disc, PONV (postoperative nausea and vomiting), and Sciatica. Family: family history includes Arthritis in her mother; Cancer in her father; Diabetes in her mother; High blood pressure in her mother; Hypertension in her mother.  Past Surgical History:  Procedure Laterality Date   ABLATION     BREAST LUMPECTOMY WITH RADIOACTIVE SEED AND SENTINEL LYMPH NODE BIOPSY Right 11/23/2020   Procedure: RIGHT BREAST LUMPECTOMY WITH RADIOACTIVE SEED AND RIGHT AXILLARY SENTINEL LYMPH NODE BIOPSY;  Surgeon: Rolm Bookbinder, MD;  Location: [place];  Service: General;  Laterality: Right;   BREAST SURGERY Bilateral 2011   Breast Reduction Surgery   BUNIONECTOMY     CARDIAC CATHETERIZATION     10/05/04 [facility]): LM < 25%, otherwise normal coronaries. No pulmonary HTN, Mildly enlarged RV. Secundum ASD s/p closure.   CARDIAC SURGERY     CATARACT EXTRACTION     CLEFT PALATE REPAIR     s/p cleft lip and palate repair   EYE SURGERY Right 2019   right eye removed   LUMBAR LAMINECTOMY/DECOMPRESSION MICRODISCECTOMY Left 05/23/2016   Procedure: LEFT L4-L5 LATERAL RECESS DECOMPRESSION WITH CENTRAL AND RIGHT DECOMPRESSION VIA LEFT SIDE;  Surgeon: [name], MD;  Location: [place];  Service: Orthopedics;  Laterality: Left;   LUMBAR LAMINECTOMY/DECOMPRESSION MICRODISCECTOMY Left 05/23/2016   Procedure: LUMBAR LAMINECTOMY/DECOMPRESSION MICRODISCECTOMY Lumbar five - Sacral One 1 LEVEL;  Surgeon: [name], MD;  Location: [place];  Service: Orthopedics;  Laterality: Left;   REDUCTION MAMMAPLASTY  2011   SHOULDER INJECTION Left 05/23/2016   Procedure: SHOULDER  INJECTION;  Surgeon: [name], MD;  Location: [place];  Service: Orthopedics;  Laterality: Left;  band-aid per pa-c   TRANSTHORACIC ECHOCARDIOGRAM     12/15/05 [facility]): Mild LVH, EF > 78%, grade 1 diastolic dysfunction, Trivial MR/PR/TR.   TUBAL LIGATION     Active Ambulatory Problems    Diagnosis Date Noted   Spinal stenosis, 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   Left shoulder tendonitis 05/23/2016   Post laminectomy syndrome 05/23/2016   Anxiety disorder 01/07/2015   Insomnia 08/02/2017   Low back pain radiating to both legs 08/02/2017   Migraine 08/02/2017   Neuropathy 08/02/2017   Pneumonitis 08/04/2019   Breast cancer ([place]) 08/04/2019   Tachycardia    Diarrhea 09/04/2019   GERD (gastroesophageal reflux disease)    Hyperlipidemia    CKD (chronic kidney disease), stage III ([place])    Depression    Malignant hypertension    Transaminitis    Fatty liver 09/04/2019   Nausea & vomiting 09/04/2019   Obesity (BMI 30-39.9) 09/04/2019  Infiltrating ductal carcinoma of breast ([place]) 10/08/2019   Bilateral leg weakness 01/24/2019   Ocular proptosis 02/14/2019   PVD (posterior vitreous detachment), left eye 02/14/2019   Chorioretinal scar of left eye 02/14/2019   Vitreous floaters of left eye 02/14/2019   Chronic pain syndrome 11/11/2019   Pharmacologic therapy 11/11/2019   Disorder of skeletal system 11/11/2019   Problems influencing health status 11/11/2019   Abnormal MRI, lumbar spine (08/22/2018) 11/11/2019   Chronic low back pain (1ry area of Pain) (Bilateral) w/ sciatica (Bilateral) 11/11/2019   DDD (degenerative disc disease), lumbosacral 11/11/2019   Grade 1 Anterolisthesis (L2-3, L3-4) 11/11/2019   Grade 1 Retrolisthesis (L4-5, L5-S1) 11/11/2019   Lumbar facet hypertrophy (Multilevel) (Bilateral) 11/11/2019   Lumbar facet syndrome (Multilevel) (Bilateral) (R>L) 11/11/2019   Chronic lower extremity pain (2ry area of Pain) (Bilateral) (R>L) 11/11/2019    Neurogenic pain 11/11/2019   Chronic musculoskeletal pain 11/11/2019   Chronic hip pain (3ry area of Pain) (Bilateral) (R>L) 11/27/2019   Chronic sacroiliac joint pain (Left) 11/27/2019   Spondylosis without myelopathy or radiculopathy, lumbosacral region 12/31/2019   Chronic low back pain (Bilateral) w/o sciatica 12/31/2019   Primary malignant neoplasm of breast with metastasis (HCC) 02/24/2020   Elevated LFTs 03/26/2020   Restrictive lung disease 04/01/2020   Pulmonary nodules 04/01/2020   Shortness of breath 03/04/2021   Decreased diffusion capacity 05/14/2021   Status post device closure of ASD 06/03/2021   Secundum ASD 06/03/2021   Snoring 07/14/2021   Fatigue 07/14/2021   Daytime somnolence 07/14/2021   Resolved Ambulatory Problems    Diagnosis Date Noted   Acute pansinusitis 08/02/2017   Lactic acidosis    Sepsis ([place]) 09/04/2019   Past Medical History:  Diagnosis Date   Anxiety    Arthritis    ASD (atrial septal defect)    Cancer (HCC)    Cataract    Dyspnea    Heart murmur    History of hiatal hernia    Legally blind in right eye, as defined in [place]    Lumbar herniated disc    PONV (postoperative nausea and vomiting)    Sciatica    Constitutional Exam  General appearance: Well nourished, well developed, and well hydrated. In no apparent acute distress Vitals:   01/11/22 1324  BP: 120/70  Pulse: 86  Temp: (!) 97.2 F (36.2 C)  SpO2: 100%  Weight: 209 lb (94.8 kg)  Height: 5' 8" (1.727 m)   BMI Assessment: Estimated body mass index is 31.78 kg/m as calculated from the following:   Height as of this encounter: 5' 8" (1.727 m).   Weight as of this encounter: 209 lb (94.8 kg).  BMI interpretation table: BMI level Category Range association with higher incidence of chronic pain  <18 kg/m2 Underweight   18.5-24.9 kg/m2 Ideal body weight   25-29.9 kg/m2 Overweight Increased incidence by 20%  30-34.9 kg/m2 Obese (Class I) Increased incidence by 68%   35-39.9 kg/m2 Severe obesity (Class II) Increased incidence by 136%  >40 kg/m2 Extreme obesity (Class III) Increased incidence by 254%   Patient's current BMI Ideal Body weight  Body mass index is 31.78 kg/m. Ideal body weight: 63.9 kg (140 lb 14 oz) Adjusted ideal body weight: 76.3 kg (168 lb 2 oz)   BMI Readings from Last 4 Encounters:  01/11/22 31.78 kg/m  01/10/22 32.73 kg/m  12/09/21 32.58 kg/m  11/30/21 32.56 kg/m   Wt Readings from Last 4 Encounters:  01/11/22 209 lb (94.8 kg)  01/10/22 209  lb (94.8 kg)  12/09/21 208 lb (94.3 kg)  11/30/21 207 lb 14.4 oz (94.3 kg)    Psych/Mental status: Alert, oriented x 3 (person, place, & time)       Eyes: PERLA Respiratory: No evidence of acute respiratory distress  Lumbar Spine Area Exam  Skin & Axial Inspection: Well healed scar from previous spine surgery detected Alignment: Symmetrical Functional ROM: Pain restricted ROM       Stability: No instability detected Muscle Tone/Strength: Functionally intact. No obvious neuro-muscular anomalies detected. Sensory (Neurological): Dermatomal pain pattern and facet mediated Palpation: No palpable anomalies        Positive pain with lumbar extension and facet loading.  Ambulation: Unassisted Gait: Antalgic Posture: Difficulty standing up straight, due to pain  Lower Extremity Exam    Side: Right lower extremity  Side: Left lower extremity  Stability: No instability observed          Stability: No instability observed          Skin & Extremity Inspection: Skin color, temperature, and hair growth are WNL. No peripheral edema or cyanosis. No masses, redness, swelling, asymmetry, or associated skin lesions. No contractures.  Skin & Extremity Inspection: Skin color, temperature, and hair growth are WNL. No peripheral edema or cyanosis. No masses, redness, swelling, asymmetry, or associated skin lesions. No contractures.  Functional ROM: Pain restricted ROM for hip and knee joints           Functional ROM: Pain restricted ROM for hip and knee joints          Muscle Tone/Strength: Functionally intact. No obvious neuro-muscular anomalies detected.  Muscle Tone/Strength: Functionally intact. No obvious neuro-muscular anomalies detected.  Sensory (Neurological): Unimpaired        Sensory (Neurological): Unimpaired        DTR: Patellar: deferred today Achilles: deferred today Plantar: deferred today  DTR: Patellar: deferred today Achilles: deferred today Plantar: deferred today  Palpation: No palpable anomalies  Palpation: No palpable anomalies    Assessment  Primary Diagnosis & Pertinent Problem List: The primary encounter diagnosis was Lumbar facet syndrome (Multilevel) (Bilateral) (R>L). Diagnoses of Lumbar facet hypertrophy (Multilevel) (Bilateral), Post laminectomy syndrome, Grade 1 Retrolisthesis (L4-5, L5-S1), Grade 1 Anterolisthesis (L2-3, L3-4), Chronic low back pain (Bilateral) w/o sciatica, Spondylosis without myelopathy or radiculopathy, lumbosacral region, Failed back surgical syndrome, Infiltrating ductal carcinoma of breast, unspecified laterality ([place]), and Chronic pain syndrome were also pertinent to this visit.  Visit Diagnosis (New problems to examiner): 1. Lumbar facet syndrome (Multilevel) (Bilateral) (R>L)   2. Lumbar facet hypertrophy (Multilevel) (Bilateral)   3. Post laminectomy syndrome   4. Grade 1 Retrolisthesis (L4-5, L5-S1)   5. Grade 1 Anterolisthesis (L2-3, L3-4)   6. Chronic low back pain (Bilateral) w/o sciatica   7. Spondylosis without myelopathy or radiculopathy, lumbosacral region   8. Failed back surgical syndrome   9. Infiltrating ductal carcinoma of breast, unspecified laterality ([place])   10. Chronic pain syndrome    Plan of Care (Initial workup plan)  General Recommendations: The pain condition that the patient suffers from is best treated with a multidisciplinary approach that involves an increase in physical activity to prevent  de-conditioning and worsening of the pain cycle, as well as psychological counseling (formal and/or informal) to address the co-morbid psychological affects of pain. Treatment will often involve judicious use of pain medications and interventional procedures to decrease the pain, allowing the patient to participate in the physical activity that will ultimately produce long-lasting pain reductions. The goal  of the multidisciplinary approach is to return the patient to a higher level of overall function and to restore their ability to perform activities of daily living.   Patient would like to repeat her bilateral L2, L3, L4, L5 facet medial branch nerve blocks that were done 03/02/2020 that provided her with 100% pain relief for approximately 6 months until she sustained a fall.  We also discussed lumbar radiofrequency ablation if she does have a positive response to her medial branch nerve block which she did in the past.  I also discussed spinal cord stimulation with her as she does have a history of postlaminectomy pain syndrome given history of prior left L4-L5 and L5-S1 laminotomies.  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  In regards to medication management in the context of her obstructive sleep apnea and the fact that she is on chronic benzodiazepine therapy with Xanax.  I informed her that she would only be a candidate for buprenorphine by way of belbuca.  Risk and benefits were reviewed of this medication.  We will obtain a baseline urine toxicology screen, and I will initiate her belbuca at 300 mcg twice daily for the first 4 weeks and then titration instructions to 450 mcg twice daily.  1. Lumbar facet syndrome (Multilevel) (Bilateral) (R>L) - LUMBAR FACET(MEDIAL BRANCH NERVE BLOCK) MBNB; Future  2. Lumbar facet hypertrophy (Multilevel) (Bilateral) - LUMBAR FACET(MEDIAL BRANCH NERVE BLOCK) MBNB;  Future  3. Post laminectomy syndrome  4. Grade 1 Retrolisthesis (L4-5, L5-S1)  5. Grade 1 Anterolisthesis (L2-3, L3-4)  6. Chronic low back pain (Bilateral) w/o sciatica  7. Spondylosis without myelopathy or radiculopathy, lumbosacral region - LUMBAR FACET(MEDIAL BRANCH NERVE BLOCK) MBNB; Future  8. Failed back surgical syndrome  9. Infiltrating ductal carcinoma of breast, unspecified laterality ([place])  10. Chronic pain syndrome - Buprenorphine HCl (BELBUCA) 300 MCG FILM; Place 1 Film inside cheek every 12 (twelve) hours.  Dispense: 60 each; Refill: 0 - Buprenorphine HCl (BELBUCA) 450 MCG FILM; Place 1 Film (450 mcg total) inside cheek every 12 (twelve) hours.  Dispense: 60 each; Refill: 0 - [facility]iance Drug Analysis, Ur   Provider-requested follow-up: Return in about 1 week (around 01/18/2022) for B/L L3, 4, 5 MBNB , in clinic NS.  Future Appointments  Date Time Provider [place]  01/12/2022  9:20 AM Ngetich, Nelda Bucks, NP [facility] None  01/13/2022  3:30 PM Tobb, Kardie, DO [facility]  01/28/2022  8:00 AM [facility] None  01/28/2022  8:45 AM [facility] [place] FLUSH [facility] None  02/02/2022 10:00 AM [name], MD [facility] None  02/25/2022  9:30 AM [facility] LAB [facility] None  02/25/2022 10:00 AM Brunetta Genera, MD [facility] None  02/25/2022 10:45 AM [facility] [place] FLUSH [facility] None  03/25/2022  8:00 AM [facility] LAB [facility] None  03/25/2022  8:45 AM [facility] [place] FLUSH [facility] None  04/11/2022  8:50 AM Jaffe, Adam R, DO [facility] None  04/22/2022  8:00 AM [facility] LAB [facility] None  04/22/2022  8:45 AM [facility] [place] FLUSH [facility] None  05/06/2022  9:00 AM Tobb, Kardie, DO [facility]  05/20/2022  8:00 AM [facility] LAB [facility] None  05/20/2022  8:45 AM [facility] [place] FLUSH [facility] None  05/25/2022  8:30 AM [facility] LAB [facility] None  06/01/2022  9:40 AM Ngetich, Dinah C, NP [facility] None   I spent a total  of 60 minutes reviewing chart data, face-to-face evaluation with the patient, counseling and  coordination of care as detailed above.   Note by: Gillis Santa, MD Date: 01/11/2022; Time: 2:01 PM

## 2022-01-11 NOTE — Progress Notes (Signed)
Nursing Pain Medication Assessment:  Safety precautions to be maintained throughout the outpatient stay will include: orient to surroundings, keep bed in low position, maintain call bell within reach at all times, provide assistance with transfer out of bed and ambulation.  

## 2022-01-11 NOTE — Progress Notes (Signed)
Safety precautions to be maintained throughout the outpatient stay will include: orient to surroundings, keep bed in low position, maintain call bell within reach at all times, provide assistance with transfer out of bed and ambulation.  

## 2022-01-12 ENCOUNTER — Ambulatory Visit (INDEPENDENT_AMBULATORY_CARE_PROVIDER_SITE_OTHER): Payer: Medicare Other | Admitting: Family

## 2022-01-12 ENCOUNTER — Encounter: Payer: Self-pay | Admitting: Family

## 2022-01-12 VITALS — BP 118/60 | HR 97 | Temp 96.9°F | Resp 16 | Ht 68.0 in | Wt 212.8 lb

## 2022-01-12 DIAGNOSIS — Z659 Problem related to unspecified psychosocial circumstances: Secondary | ICD-10-CM | POA: Diagnosis not present

## 2022-01-12 DIAGNOSIS — I10 Essential (primary) hypertension: Secondary | ICD-10-CM | POA: Diagnosis not present

## 2022-01-12 DIAGNOSIS — I951 Orthostatic hypotension: Secondary | ICD-10-CM | POA: Diagnosis not present

## 2022-01-12 DIAGNOSIS — M5416 Radiculopathy, lumbar region: Secondary | ICD-10-CM

## 2022-01-12 LAB — CBC WITH DIFFERENTIAL/PLATELET
Absolute Monocytes: 315 cells/uL (ref 200–950)
Basophils Absolute: 41 cells/uL (ref 0–200)
Basophils Relative: 0.9 %
Eosinophils Absolute: 50 cells/uL (ref 15–500)
Eosinophils Relative: 1.1 %
HCT: 34.4 % — ABNORMAL LOW (ref 35.0–45.0)
Hemoglobin: 11.6 g/dL — ABNORMAL LOW (ref 11.7–15.5)
Lymphs Abs: 2453 cells/uL (ref 850–3900)
MCH: 35.9 pg — ABNORMAL HIGH (ref 27.0–33.0)
MCHC: 33.7 g/dL (ref 32.0–36.0)
MCV: 106.5 fL — ABNORMAL HIGH (ref 80.0–100.0)
MPV: 9.1 fL (ref 7.5–12.5)
Monocytes Relative: 7 %
Neutro Abs: 1643 cells/uL (ref 1500–7800)
Neutrophils Relative %: 36.5 %
Platelets: 207 10*3/uL (ref 140–400)
RBC: 3.23 10*6/uL — ABNORMAL LOW (ref 3.80–5.10)
RDW: 13.4 % (ref 11.0–15.0)
Total Lymphocyte: 54.5 %
WBC: 4.5 10*3/uL (ref 3.8–10.8)

## 2022-01-12 NOTE — Patient Instructions (Signed)
Wear knee high compression stockings on in the morning and off at bedtime for orthostatic hypotension.  - Follow up with Cardiologist as scheduled tomorrow.   Orthostatic Hypotension   Blood pressure is a measurement of how strongly, or weakly, your circulating blood is pressing against the walls of your arteries. Orthostatic hypotension is a drop in blood pressure that can happen when you change positions, such as when you go from lying down to standing. Arteries are blood vessels that carry blood from your heart throughout your body. When blood pressure is too low, you may not get enough blood to your brain or to the rest of your organs. Orthostatic hypotension can cause light-headedness, sweating, rapid heartbeat, blurred vision, and fainting. These symptoms require further investigation into the cause. What are the causes? Orthostatic hypotension can be caused by many things, including: Sudden changes in posture, such as standing up quickly after you have been sitting or lying down. Loss of blood (anemia) or loss of body fluids (dehydration). Heart problems, neurologic problems, or hormone problems. Pregnancy. Aging. The risk for this condition increases as you get older. Severe infection (sepsis). Certain medicines, such as medicines for high blood pressure or medicines that make the body lose excess fluids (diuretics). What are the signs or symptoms? Symptoms of this condition may include: Weakness, light-headedness, or dizziness. Sweating. Blurred vision. Tiredness (fatigue). Rapid heartbeat. Fainting, in severe cases. How is this diagnosed? This condition is diagnosed based on: Your symptoms and medical history. Your blood pressure measurements. Your health care provider will check your blood pressure when you are: Lying down. Sitting. Standing. A blood pressure reading is recorded as two numbers, such as "120 over 80" (or 120/80). The first ("top") number is called the  systolic pressure. It is a measure of the pressure in your arteries as your heart beats. The second ("bottom") number is called the diastolic pressure. It is a measure of the pressure in your arteries when your heart relaxes between beats. Blood pressure is measured in a unit called mmHg. Healthy blood pressure for most adults is 120/80 mmHg. Orthostatic hypotension is defined as a 20 mmHg drop in systolic pressure or a 10 mmHg drop in diastolic pressure within 3 minutes of standing. Other information or tests that may be used to diagnose orthostatic hypotension include: Your other vital signs, such as your heart rate and temperature. Blood tests. An electrocardiogram (ECG) or echocardiogram. A Holter monitor. This is a device you wear that records your heart rhythm continuously, usually for 24-48 hours. Tilt table test. For this test, you will be safely secured to a table that moves you from a lying position to an upright position. Your heart rhythm and blood pressure will be monitored during the test. How is this treated? This condition may be treated by: Changing your diet. This may involve eating more salt (sodium) or drinking more water. Changing the dosage of certain medicines you are taking that might be lowering your blood pressure. Correcting the underlying reason for the orthostatic hypotension. Wearing compression stockings. Taking medicines to raise your blood pressure. Avoiding actions that trigger symptoms. Follow these instructions at home: Medicines Take over-the-counter and prescription medicines only as told by your health care provider. Follow instructions from your health care provider about changing the dosage of your current medicines, if this applies. Do not stop or adjust any of your medicines on your own. Eating and drinking  Drink enough fluid to keep your urine pale yellow. Eat extra salt only as  directed. Do not add extra salt to your diet unless advised by your  health care provider. Eat frequent, small meals. Avoid standing up suddenly after eating. General instructions  Get up slowly from lying down or sitting positions. This gives your blood pressure a chance to adjust. Avoid hot showers and excessive heat as directed by your health care provider. Engage in regular physical activity as directed by your health care provider. If you have compression stockings, wear them as told. Keep all follow-up visits. This is important. Contact a health care provider if: You have a fever for more than 2-3 days. You feel more thirsty than usual. You feel dizzy or weak. Get help right away if: You have chest pain. You have a fast or irregular heartbeat. You become sweaty or feel light-headed. You feel short of breath. You faint. You have any symptoms of a stroke. "BE FAST" is an easy way to remember the main warning signs of a stroke: B - Balance. Signs are dizziness, sudden trouble walking, or loss of balance. E - Eyes. Signs are trouble seeing or a sudden change in vision. F - Face. Signs are sudden weakness or numbness of the face, or the face or eyelid drooping on one side. A - Arms. Signs are weakness or numbness in an arm. This happens suddenly and usually on one side of the body. S - Speech. Signs are sudden trouble speaking, slurred speech, or trouble understanding what people say. T - Time. Time to call emergency services. Write down what time symptoms started. You have other signs of a stroke, such as: A sudden, severe headache with no known cause. Nausea or vomiting. Seizure. These symptoms may represent a serious problem that is an emergency. Do not wait to see if the symptoms will go away. Get medical help right away. Call your local emergency services (911 in the [place] Do not drive yourself to the hospital. Summary Orthostatic hypotension is a sudden drop in blood pressure. It can cause light-headedness, sweating, rapid heartbeat, blurred  vision, and fainting. Orthostatic hypotension can be diagnosed by having your blood pressure taken while lying down, sitting, and then standing. Treatment may involve changing your diet, wearing compression stockings, sitting up slowly, adjusting your medicines, or correcting the underlying reason for the orthostatic hypotension. Get help right away if you have chest pain, a fast or irregular heartbeat, or symptoms of a stroke. This information is not intended to replace advice given to you by your health care provider. Make sure you discuss any questions you have with your health care provider. Document Revised: 08/06/2020 Document Reviewed: 08/06/2020 Elsevier Patient Education  [place].

## 2022-01-12 NOTE — Progress Notes (Signed)
Provider: Marlowe Sax FNP-C  Mardi Cannady, Nelda Bucks, NP  Patient Care Team: Murvin Gift, Nelda Bucks, NP as PCP - General (Family Medicine) Berniece Salines, DO as PCP - Cardiology (Cardiology) Everardo Beals, NP as Nurse Practitioner  Extended Emergency Contact Information Primary Emergency Contact: [place] Address: [address]          [place], [place] [address] Johnnette Litter of [place] Phone: [phone] Relation: Spouse Secondary Emergency Contact: [place] Address: [address]          [place], [place] [address] Johnnette Litter of [place] Phone: [phone] Relation: Daughter  Code Status:  Full Code  Goals of care: Advanced Directive information    01/12/2022    8:54 AM  Advanced Directives  Does Patient Have a Medical Advance Directive? No  Would patient like information on creating a medical advance directive? No - Patient declined     Chief Complaint  Patient presents with   Acute Visit    Patient complains of pain in both thighs, lower back, and buttocks. Patient states this started Friday 01/07/2022    HPI:  Pt is a 56 y.o. female seen today for an acute visit for evaluation of pain on both thighs,lower back and buttocks x 5 days.  He was seen by Orthopedic yesterday but thought pain is from Nerve related.  Also seen pain management new medication prescribed but on back order ( film to apply on the mouth).plans also for nerve block on the back.  [place] July 4 th,2023  State has increased stress level due to her current living situation. Husband is alcoholic and abuses drugs sometimes does not know where he is does not come home. States she Pays all the medical pills but husband pays only one bill yet he works and receives disability money.also living with her daughter who has 4 kids but will be getting her placed soon." I Have worked hard and helped everyone else in the family and now has her own medical conditions would just like to have some  peace of mind". She states trying to look for a place with 2 bedrooms where she can be away from the husband. Plans to file for divorce.  Has a Education officer, museum from cancer center.   Past Medical History:  Diagnosis Date   Acute pansinusitis 08/02/2017   Anxiety    Arthritis    ASD (atrial septal defect)    s/p closure with Amplatzer device 10/05/04 (Dr. Myriam Jacobson, [place]) 10/05/04   Cancer ([place])    Cataract    Dyspnea    GERD (gastroesophageal reflux disease)    Heart murmur    no longer heard   History of hiatal hernia    Legally blind in right eye, as defined in [place]    Lumbar herniated disc    PONV (postoperative nausea and vomiting)    Sciatica    Past Surgical History:  Procedure Laterality Date   ABLATION     BREAST LUMPECTOMY WITH RADIOACTIVE SEED AND SENTINEL LYMPH NODE BIOPSY Right 11/23/2020   Procedure: RIGHT BREAST LUMPECTOMY WITH RADIOACTIVE SEED AND RIGHT AXILLARY SENTINEL LYMPH NODE BIOPSY;  Surgeon: Rolm Bookbinder, MD;  Location: [facility];  Service: General;  Laterality: Right;   BREAST SURGERY Bilateral 2011   Breast Reduction Surgery   BUNIONECTOMY     CARDIAC CATHETERIZATION     10/05/04 [facility]): LM < 25%, otherwise normal coronaries. No pulmonary HTN, Mildly enlarged RV. Secundum ASD s/p closure.   CARDIAC SURGERY  CATARACT EXTRACTION     CLEFT PALATE REPAIR     s/p cleft lip and palate repair   EYE SURGERY Right 2019   right eye removed   LUMBAR LAMINECTOMY/DECOMPRESSION MICRODISCECTOMY Left 05/23/2016   Procedure: LEFT L4-L5 LATERAL RECESS DECOMPRESSION WITH CENTRAL AND RIGHT DECOMPRESSION VIA LEFT SIDE;  Surgeon: [name], MD;  Location: [name];  Service: Orthopedics;  Laterality: Left;   LUMBAR LAMINECTOMY/DECOMPRESSION MICRODISCECTOMY Left 05/23/2016   Procedure: LUMBAR LAMINECTOMY/DECOMPRESSION MICRODISCECTOMY Lumbar five - Sacral One 1 LEVEL;  Surgeon: [name], MD;  Location: [facility];  Service: Orthopedics;  Laterality: Left;   REDUCTION  MAMMAPLASTY  2011   SHOULDER INJECTION Left 05/23/2016   Procedure: SHOULDER INJECTION;  Surgeon: [name], MD;  Location: [facility];  Service: Orthopedics;  Laterality: Left;  band-aid per pa-c   TRANSTHORACIC ECHOCARDIOGRAM     12/15/05 [facility]): Mild LVH, EF > 34%, grade 1 diastolic dysfunction, Trivial MR/PR/TR.   TUBAL LIGATION      Allergies  Allergen Reactions   Zithromax [Azithromycin] Shortness Of Breath and Itching    TOTAL BODY ITCHING [EVEN SOLES OF FEET] WHEEZING    Other     z -pack   Psyllium Nausea And Vomiting    Metamucil     Outpatient Encounter Medications as of 01/12/2022  Medication Sig   albuterol (PROVENTIL) (2.5 MG/3ML) 0.083% nebulizer solution Take 3 mLs (2.5 mg total) by nebulization every 6 (six) hours as needed for wheezing or shortness of breath.   ALPRAZolam (XANAX) 1 MG tablet TAKE 1 TABLET BY MOUTH AT  BEDTIME   amitriptyline (ELAVIL) 10 MG tablet Take 1 tablet (10 mg total) by mouth at bedtime.   Buprenorphine HCl (BELBUCA) 300 MCG FILM Place 1 Film inside cheek every 12 (twelve) hours.   [START ON 02/10/2022] Buprenorphine HCl (BELBUCA) 450 MCG FILM Place 1 Film (450 mcg total) inside cheek every 12 (twelve) hours.   Carboxymethylcellul-Glycerin (LUBRICATING EYE DROPS OP) Place 1 drop into the left eye 4 (four) times daily.   diphenoxylate-atropine (LOMOTIL) 2.5-0.025 MG tablet Take 1 tablet by mouth 3 (three) times daily as needed for diarrhea or loose stools.   fluticasone-salmeterol (WIXELA INHUB) 250-50 MCG/ACT AEPB Inhale 1 puff into the lungs in the morning and at bedtime.   ketoconazole (NIZORAL) 2 % cream SMARTSIG:1 Application Topical 1 to 2 Times Daily   loperamide (IMODIUM) 2 MG capsule Take 1-2 capsules (2-4 mg total) by mouth 4 (four) times daily as needed for diarrhea or loose stools.   magnesium oxide (MAG-OX) 400 MG tablet Take 1 tablet (400 mg total) by mouth 2 (two) times daily.   methocarbamol (ROBAXIN) 500 MG tablet Take 1 tablet  (500 mg total) by mouth every 6 (six) hours as needed for muscle spasms.   midodrine (PROAMATINE) 10 MG tablet Take 1 tablet (10 mg total) by mouth 3 (three) times daily.   Multiple Vitamin (MULTIVITAMIN WITH MINERALS) TABS tablet Take 1 tablet by mouth daily.   omeprazole (PRILOSEC OTC) 20 MG tablet Take 20 mg by mouth daily.   ondansetron (ZOFRAN) 8 MG tablet TAKE 1 TABLET(8 MG) BY MOUTH EVERY 8 HOURS AS NEEDED FOR NAUSEA OR VOMITING   oxyCODONE (OXY IR/ROXICODONE) 5 MG immediate release tablet Take 1 tablet (5 mg total) by mouth every 6 (six) hours as needed for severe pain.   triamcinolone cream (KENALOG) 0.1 % SMARTSIG:1 Application Topical 2-3 Times Daily   VERZENIO 100 MG tablet Take 1 tablet (100 mg total)  by mouth 2 (two) times daily. Swallow tablets whole. Do not chew, crush, or split tablets before swallowing.   Vitamin D, Ergocalciferol, (DRISDOL) 1.25 MG (50000 UNIT) CAPS capsule TAKE 1 CAPSULE ONCE PER WEEK   White Petrolatum-Mineral Oil (LUBRICANT EYE NIGHTTIME) OINT Place 1 drop into the left eye at bedtime.   rosuvastatin (CRESTOR) 10 MG tablet Take 1 tablet (10 mg total) by mouth daily.   No facility-administered encounter medications on file as of 01/12/2022.    Review of Systems  Constitutional:  Negative for appetite change, chills, fatigue, fever and unexpected weight change.  Respiratory:  Negative for cough, chest tightness, shortness of breath and wheezing.   Cardiovascular:  Negative for chest pain, palpitations and leg swelling.  Gastrointestinal:  Negative for abdominal distention, abdominal pain, blood in stool, constipation, diarrhea, nausea and vomiting.  Genitourinary:  Negative for difficulty urinating, dysuria, flank pain, frequency and urgency.  Musculoskeletal:  Positive for arthralgias, back pain and gait problem. Negative for joint swelling, myalgias, neck pain and neck stiffness.  Skin:  Negative for color change, pallor, rash and wound.  Neurological:   Negative for dizziness, syncope, speech difficulty, weakness, light-headedness, numbness and headaches.  Hematological:  Does not bruise/bleed easily.  Psychiatric/Behavioral:  Negative for agitation, behavioral problems, confusion, hallucinations, self-injury, sleep disturbance and suicidal ideas. The patient is not nervous/anxious.        Increase social stress level living with the family    Immunization History  Administered Date(s) Administered   Influenza Inj Mdck Quad Pf 04/25/2019   Influenza,inj,Quad PF,6+ Mos 02/24/2020   Influenza,inj,quad, With Preservative 04/25/2019   Influenza-Unspecified 05/26/2021   Moderna Sars-Covid-2 Vaccination 06/29/2019, 08/03/2019, 06/24/2020   PFIZER(Purple Top)SARS-COV-2 Vaccination 05/26/2021   Pneumococcal Conjugate-13 05/29/2019   Pneumococcal Polysaccharide-23 07/29/2019   Zoster Recombinat (Shingrix) 02/17/2020, 04/20/2020   Pertinent  Health Maintenance Due  Topic Date Due   PAP SMEAR-Modifier  Never done   INFLUENZA VACCINE  01/04/2022   MAMMOGRAM  01/05/2024   COLONOSCOPY (Pts 45-24yr Insurance coverage will need to be confirmed)  08/16/2027      09/24/2021    8:37 AM 11/08/2021   10:50 AM 11/30/2021    9:13 AM 01/11/2022    1:33 PM 01/12/2022    8:54 AM  Fall Risk  Falls in the past year? 0 0 0 1 1  Was there an injury with Fall? 0 0 0 1 1  Fall Risk Category Calculator 0 0 0 3 2  Fall Risk Category Low Low Low High Moderate  Patient Fall Risk Level Low fall risk Low fall risk Low fall risk  Moderate fall risk  Patient at Risk for Falls Due to No Fall Risks  No Fall Risks  History of fall(s);Impaired balance/gait  Fall risk Follow up Falls evaluation completed  Falls evaluation completed  Falls evaluation completed   Functional Status Survey:    Vitals:   01/12/22 0851  BP: 118/60  Pulse: 97  Resp: 16  Temp: (!) 96.9 F (36.1 C)  SpO2: 97%  Weight: 212 lb 12.8 oz (96.5 kg)  Height: '5\' 8"'$  (1.727 m)   Body mass index is  32.36 kg/m. Physical Exam Vitals reviewed.  Constitutional:      General: She is not in acute distress.    Appearance: Normal appearance. She is normal weight. She is not ill-appearing or diaphoretic.  HENT:     Head: Normocephalic.  Eyes:     General: No scleral icterus.       Left  eye: No discharge.     Conjunctiva/sclera: Conjunctivae normal.     Comments: Right eye blindness  Neck:     Vascular: No carotid bruit.  Cardiovascular:     Rate and Rhythm: Normal rate and regular rhythm.     Pulses: Normal pulses.     Heart sounds: Normal heart sounds. No murmur heard.    No friction rub. No gallop.     Comments: Orthostatic hypotension Pulmonary:     Effort: Pulmonary effort is normal. No respiratory distress.     Breath sounds: Normal breath sounds. No wheezing, rhonchi or rales.  Chest:     Chest wall: No tenderness.  Abdominal:     General: Bowel sounds are normal. There is no distension.     Palpations: Abdomen is soft. There is no mass.     Tenderness: There is no abdominal tenderness. There is no right CVA tenderness, left CVA tenderness, guarding or rebound.  Musculoskeletal:        General: No swelling or tenderness. Normal range of motion.     Cervical back: Normal range of motion. No rigidity or tenderness.     Right lower leg: No edema.     Left lower leg: No edema.  Lymphadenopathy:     Cervical: No cervical adenopathy.  Skin:    General: Skin is warm and dry.     Coloration: Skin is not pale.     Findings: No bruising, erythema, lesion or rash.  Neurological:     Mental Status: She is alert and oriented to person, place, and time.     Cranial Nerves: No cranial nerve deficit.     Sensory: No sensory deficit.     Motor: No weakness.     Coordination: Coordination normal.     Gait: Gait abnormal.  Psychiatric:        Mood and Affect: Mood normal.        Speech: Speech normal.        Behavior: Behavior normal.        Thought Content: Thought content  normal.        Judgment: Judgment normal.     Labs reviewed: Recent Labs    06/14/21 1431 07/12/21 1246 10/19/21 1433 10/27/21 0000 11/30/21 1250 12/31/21 0811  NA 143   < > 146* 142 141 139  K 4.7   < > 3.8 3.5 3.9 3.9  CL 102   < > 107 101 101 102  CO2 25   < > 33* 33* 33* 30  GLUCOSE 88   < > 111*  --  107* 148*  BUN 13   < > '11 18 17 13  '$ CREATININE 1.48*   < > 1.66* 1.4* 1.89* 2.13*  CALCIUM 9.6   < > 9.3 9.5 9.4 9.3  MG 1.5*  --   --   --   --   --    < > = values in this interval not displayed.   Recent Labs    10/19/21 1433 10/27/21 0000 11/30/21 1250 12/31/21 0811  AST 97*  --  81* 60*  ALT 70*  --  70* 49*  ALKPHOS 237*  --  331* 282*  BILITOT 0.6  --  0.6 0.7  PROT 7.5  --  7.8 7.4  ALBUMIN 4.0 3.8 4.0 3.7   Recent Labs    10/26/21 1101 10/27/21 0000 11/30/21 1250 12/31/21 0811  WBC 4.0  --  4.7 6.1  NEUTROABS 932*  --  1.7  1.4*  HGB 11.8 11.7* 12.2 11.3*  HCT 34.9*  --  36.0 33.4*  MCV 105.4*  --  106.2* 105.7*  PLT 242  --  216 176   Lab Results  Component Value Date   TSH 0.91 05/28/2021   No results found for: "HGBA1C" Lab Results  Component Value Date   CHOL 166 10/26/2021   HDL 74 10/26/2021   LDLCALC 67 10/26/2021   TRIG 172 (H) 10/26/2021   CHOLHDL 2.2 10/26/2021    Significant Diagnostic Results in last 30 days:  MM DIAG BREAST TOMO BILATERAL  Result Date: 01/04/2022 CLINICAL DATA:  Patient has a history of metastatic right breast cancer diagnosed in October of 2020. Patient is status post right breast lumpectomy in June of 2022. EXAM: DIGITAL DIAGNOSTIC BILATERAL MAMMOGRAM WITH TOMOSYNTHESIS TECHNIQUE: Bilateral digital diagnostic mammography and breast tomosynthesis was performed. COMPARISON:  Previous exam(s). ACR Breast Density Category b: There are scattered areas of fibroglandular density. FINDINGS: Cc and MLO views of bilateral breasts are submitted. Postsurgical changes are identified in the right breast. The left breast is  stable. IMPRESSION: Benign findings. RECOMMENDATION: Bilateral diagnostic mammogram in 1 year. I have discussed the findings and recommendations with the patient. If applicable, a reminder letter will be sent to the patient regarding the next appointment. BI-RADS CATEGORY  2: Benign. Electronically Signed   By: Abelardo Diesel M.D.   On: 01/04/2022 16:27  CT ORBITS W CONTRAST  Result Date: 12/17/2021 EXAM: CT ORBITS WITH CONTRAST TECHNIQUE: Multidetector CT images was performed according to the standard protocol following intravenous contrast administration. RADIATION DOSE REDUCTION: This exam was performed according to the departmental dose-optimization program which includes automated exposure control, adjustment of the mA and/or kV according to patient size and/or use of iterative reconstruction technique. CONTRAST:  67m ISOVUE-300 IOPAMIDOL (ISOVUE-300) INJECTION 61% COMPARISON:  CT neck 02/28/2019 FINDINGS: Orbits: Right eye prosthesis appear similar to prior CT neck from 2020. Atrophy of the right-sided extraocular muscles. 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 Otherwise, unremarkable left orbit. Retro bulbar fat is unremarkable. No orbital mass lesion identified. The optic nerves/nerve sheaths are within normal limits. No evidence of acute fracture or edema. Visible paranasal sinuses: Clear. Soft tissues: Normal. Osseous: No fracture or aggressive lesion. Limited intracranial: No acute or significant finding. IMPRESSION: 1. Right eye prosthesis appear similar to prior CT neck from 2020. Atrophy of the right-sided extraocular muscles. 2. 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 Electronically Signed   By: FMargaretha SheffieldM.D.   On: 12/17/2021 12:25    Assessment/Plan  1. Lumbar back pain with radiculopathy affecting right lower extremity Chronic -Continue on current pain regimen -Continue to follow-up with pain management -Follow-up with orthopedic  2. Essential hypertension Blood pressure drops with change  of position as below - CBC with Differential/Platelet  3. Orthostatic hypotension Significant blood pressure drop noted from change of position from lying, sitting and standing. -Continue on midodrine -Advised to wear compression stockings on in the morning and off at bedtime to help with orthostatic hypotension - Compression stockings  4. Social stress Family stress level as above.  In the process of trying to move out to a 2 bedroom apartment and file for divorce due to husband drinking alcohol and abusing drugs and not helping her with bills. 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   Sandrea Hughs, NP

## 2022-01-13 ENCOUNTER — Encounter: Payer: Self-pay | Admitting: Student in an Organized Health Care Education/Training Program

## 2022-01-13 ENCOUNTER — Encounter: Payer: Self-pay | Admitting: Cardiology

## 2022-01-13 ENCOUNTER — Ambulatory Visit: Payer: Medicare Other | Admitting: Cardiology

## 2022-01-13 VITALS — BP 128/80 | HR 83 | Ht 67.0 in | Wt 212.6 lb

## 2022-01-13 DIAGNOSIS — I951 Orthostatic hypotension: Secondary | ICD-10-CM | POA: Diagnosis not present

## 2022-01-13 DIAGNOSIS — E669 Obesity, unspecified: Secondary | ICD-10-CM | POA: Diagnosis not present

## 2022-01-13 MED ORDER — FLUDROCORTISONE ACETATE 0.1 MG PO TABS: 0.1000 mg | ORAL_TABLET | Freq: Three times a day (TID) | ORAL | 3 refills | Status: DC

## 2022-01-13 NOTE — Progress Notes (Addendum)
Cardiology Office Note:    Date:  01/17/2022   ID:  Brandy [name], DOB 1966-04-21, MRN [id number]  PCP:  Brandy Hughs, NP  Cardiologist:  Brandy Salines, DO  Electrophysiologist:  None   Referring MD: Brandy Hughs, NP   I am still having the passout feeling  History of Present Illness:    Brandy [name] is a 56 y.o. female with a hx of secundum ASD s/p repair in 2006, prior catheterization in 2013 with did not show any evidence of coronary artery disease, anxiety, lung cancer status post surgery is here today for follow-up visit.   I first saw the patient on June 03, 2021 at that time she was experiencing significant shortness of breath and chest discomfort.  Given her history I recommend the patient undergo a coronary CTA along with an echocardiogram.  All of her testing did not show any evidence of coronary artery disease or any significant structural abnormality.   At her last visit in February 2022 she was still experiencing some lightheadedness I started the patient on midodrine given her orthostatic hypotension.  I encouraged abdominal binder as well as compression socks.  She was experiencing some headache I referred the patient to neurology.  At her visit on November 04, 2021 her midodrine has been increased to 10 mg every 8 hours and she was tolerating this medication well.  Today she tells me that she has had a couple episodes where she felt like she was going to pass out.  She has not actually passed out recently.  But she has the feeling she has to sit down.  Past Medical History:  Diagnosis Date   Acute pansinusitis 08/02/2017   Anxiety    Arthritis    ASD (atrial septal defect)    s/p closure with Amplatzer device 10/05/04 (Dr. Myriam [name], [place]) 10/05/04   Cancer ([place])    Cataract    Dyspnea    GERD (gastroesophageal reflux disease)    Heart murmur    no longer heard   History of hiatal hernia    Legally blind in right eye, as  defined in [place]    Lumbar herniated disc    PONV (postoperative nausea and vomiting)    Sciatica     Past Surgical History:  Procedure Laterality Date   ABLATION     BREAST LUMPECTOMY WITH RADIOACTIVE SEED AND SENTINEL LYMPH NODE BIOPSY Right 11/23/2020   Procedure: RIGHT BREAST LUMPECTOMY WITH RADIOACTIVE SEED AND RIGHT AXILLARY SENTINEL LYMPH NODE BIOPSY;  Surgeon: Brandy Bookbinder, MD;  Location: [facility];  Service: General;  Laterality: Right;   BREAST SURGERY Bilateral 2011   Breast Reduction Surgery   BUNIONECTOMY     CARDIAC CATHETERIZATION     10/05/04 [facility]): LM < 25%, otherwise normal coronaries. No pulmonary HTN, Mildly enlarged RV. Secundum ASD s/p closure.   CARDIAC SURGERY     CATARACT EXTRACTION     CLEFT PALATE REPAIR     s/p cleft lip and palate repair   EYE SURGERY Right 2019   right eye removed   LUMBAR LAMINECTOMY/DECOMPRESSION MICRODISCECTOMY Left 05/23/2016   Procedure: LEFT L4-L5 LATERAL RECESS DECOMPRESSION WITH CENTRAL AND RIGHT DECOMPRESSION VIA LEFT SIDE;  Surgeon: Brandy Oto, MD;  Location: [facility];  Service: Orthopedics;  Laterality: Left;   LUMBAR LAMINECTOMY/DECOMPRESSION MICRODISCECTOMY Left 05/23/2016   Procedure: LUMBAR LAMINECTOMY/DECOMPRESSION MICRODISCECTOMY Lumbar five - Sacral One 1 LEVEL;  Surgeon: Brandy Oto, MD;  Location: [facility];  Service: Orthopedics;  Laterality: Left;   REDUCTION MAMMAPLASTY  2011   SHOULDER INJECTION Left 05/23/2016   Procedure: SHOULDER INJECTION;  Surgeon: Brandy Oto, MD;  Location: [facility];  Service: Orthopedics;  Laterality: Left;  band-aid per pa-c   TRANSTHORACIC ECHOCARDIOGRAM     12/15/05 [facility]): Mild LVH, EF > 06%, grade 1 diastolic dysfunction, Trivial MR/PR/TR.   TUBAL LIGATION      Current Medications: Current Meds  Medication Sig   albuterol (PROVENTIL) (2.5 MG/3ML) 0.083% nebulizer solution Take 3 mLs (2.5 mg total) by nebulization every 6 (six) hours as needed for wheezing or shortness of breath.    ALPRAZolam (XANAX) 1 MG tablet TAKE 1 TABLET BY MOUTH AT  BEDTIME   amitriptyline (ELAVIL) 75 MG tablet Take 75 mg by mouth at bedtime.   Buprenorphine HCl (BELBUCA) 300 MCG FILM Place 1 Film inside cheek every 12 (twelve) hours.   [START ON 02/10/2022] Buprenorphine HCl (BELBUCA) 450 MCG FILM Place 1 Film (450 mcg total) inside cheek every 12 (twelve) hours.   Carboxymethylcellul-Glycerin (LUBRICATING EYE DROPS OP) Place 1 drop into the left eye 4 (four) times daily.   diphenoxylate-atropine (LOMOTIL) 2.5-0.025 MG tablet Take 1 tablet by mouth 3 (three) times daily as needed for diarrhea or loose stools.   fludrocortisone (FLORINEF) 0.1 MG tablet Take 1 tablet (0.1 mg total) by mouth 3 (three) times daily.   fluticasone-salmeterol (WIXELA INHUB) 250-50 MCG/ACT AEPB Inhale 1 puff into the lungs in the morning and at bedtime.   ketoconazole (NIZORAL) 2 % cream SMARTSIG:1 Application Topical 1 to 2 Times Daily   loperamide (IMODIUM) 2 MG capsule Take 1-2 capsules (2-4 mg total) by mouth 4 (four) times daily as needed for diarrhea or loose stools.   magnesium oxide (MAG-OX) 400 MG tablet Take 1 tablet (400 mg total) by mouth 2 (two) times daily. (Patient taking differently: Take 400 mg by mouth as needed.)   methocarbamol (ROBAXIN) 500 MG tablet Take 1 tablet (500 mg total) by mouth every 6 (six) hours as needed for muscle spasms.   midodrine (PROAMATINE) 10 MG tablet Take 1 tablet (10 mg total) by mouth 3 (three) times daily.   Multiple Vitamin (MULTIVITAMIN WITH MINERALS) TABS tablet Take 1 tablet by mouth daily.   omeprazole (PRILOSEC OTC) 20 MG tablet Take 20 mg by mouth daily.   ondansetron (ZOFRAN) 8 MG tablet TAKE 1 TABLET(8 MG) BY MOUTH EVERY 8 HOURS AS NEEDED FOR NAUSEA OR VOMITING   oxyCODONE (OXY IR/ROXICODONE) 5 MG immediate release tablet Take 1 tablet (5 mg total) by mouth every 6 (six) hours as needed for severe pain.   rosuvastatin (CRESTOR) 10 MG tablet Take 1 tablet (10 mg total) by  mouth daily.   triamcinolone cream (KENALOG) 0.1 % SMARTSIG:1 Application Topical 2-3 Times Daily   VERZENIO 100 MG tablet Take 1 tablet (100 mg total) by mouth 2 (two) times daily. Swallow tablets whole. Do not chew, crush, or split tablets before swallowing.   Vitamin D, Ergocalciferol, (DRISDOL) 1.25 MG (50000 UNIT) CAPS capsule TAKE 1 CAPSULE ONCE PER WEEK   White Petrolatum-Mineral Oil (LUBRICANT EYE NIGHTTIME) OINT Place 1 drop into the left eye at bedtime.     Allergies:   Zithromax [azithromycin], Other, and Psyllium   Social History   Socioeconomic History   Marital status: Married    Spouse name: Not on file   Number of children: 3   Years of education: Not on file   Highest education level: Not on  file  Occupational History   Not on file  Tobacco Use   Smoking status: Former    Packs/day: 0.25    Years: 25.00    Total pack years: 6.25    Types: Cigarettes    Quit date: 02/04/2018    Years since quitting: 3.9   Smokeless tobacco: Never  Vaping Use   Vaping Use: Never used  Substance and Sexual Activity   Alcohol use: No   Drug use: No   Sexual activity: Not on file  Other Topics Concern   Not on file  Social History Narrative   Tobacco use, amount per day now: None.   Past tobacco use, amount per day: 1/4   How many years did you use tobacco: Intermittent x 20 years   Alcohol use (drinks per week): N/A   Diet: Plant Base   Do you drink/eat things with caffeine: Coffee, Tea, Soda.   Marital status:    Married                              What year were you married? 1999   Do you live in a house, apartment, assisted living, condo, trailer, etc.? House    Is it one or more stories? 2   How many persons live in your home? 6 adults, 5 children.   Do you have pets in your home?( please list) 1 Terrier   Highest Level of education completed? AAS   Current or past profession: LPN   Do you exercise?   A little                               Type and how often? Walk,  stretches.    Do you have a living will? No   Do you have a DNR form?   No                                If not, do you want to discuss one?   Do you have signed POA/HPOA forms? No                       If so, please bring to you appointment      Do you have any difficulty bathing or dressing yourself? No   Do you have any difficulty preparing food or eating? No   Do you have any difficulty managing your medications? No   Do you have any difficulty managing your finances? No   Do you have any difficulty affording your medications? No.   Social Determinants of Health   Financial Resource Strain: Not on file  Food Insecurity: Food Insecurity Present (12/08/2021)   Hunger Vital Sign    Worried About Running Out of Food in the Last Year: Sometimes true    Ran Out of Food in the Last Year: Sometimes true  Transportation Needs: Unmet Transportation Needs (12/08/2021)   PRAPARE - Hydrologist (Medical): No    Lack of Transportation (Non-Medical): Yes  Physical Activity: Not on file  Stress: Not on file  Social Connections: Not on file     Family History: The patient's family history includes Arthritis in her mother; Cancer in her father; Diabetes in her mother; High blood pressure in her mother; Hypertension in her  mother. There is no history of Colon cancer, Colon polyps, Esophageal cancer, Rectal cancer, or Stomach cancer.  ROS:   Review of Systems  Constitution: Negative for decreased appetite, fever and weight gain.  HENT: Negative for congestion, ear discharge, hoarse voice and sore throat.   Eyes: Negative for discharge, redness, vision loss in right eye and visual halos.  Cardiovascular: Negative for chest pain, dyspnea on exertion, leg swelling, orthopnea and palpitations.  Respiratory: Negative for cough, hemoptysis, shortness of breath and snoring.   Endocrine: Negative for heat intolerance and polyphagia.  Hematologic/Lymphatic: Negative for bleeding  problem. Does not bruise/bleed easily.  Skin: Negative for flushing, nail changes, rash and suspicious lesions.  Musculoskeletal: Negative for arthritis, joint pain, muscle cramps, myalgias, neck pain and stiffness.  Gastrointestinal: Negative for abdominal pain, bowel incontinence, diarrhea and excessive appetite.  Genitourinary: Negative for decreased libido, genital sores and incomplete emptying.  Neurological: Negative for brief paralysis, focal weakness, headaches and loss of balance.  Psychiatric/Behavioral: Negative for altered mental status, depression and suicidal ideas.  Allergic/Immunologic: Negative for HIV exposure and persistent infections.    EKGs/Labs/Other Studies Reviewed:    The following studies were reviewed today:   EKG:  None today  TTE 06/03/2022 IMPRESSIONS   1. Left ventricular ejection fraction, by estimation, is 60 to 65%. The left ventricle has normal function. The left ventricle has no regional wall motion abnormalities. Left ventricular diastolic parameters were normal.   2. Right ventricular systolic function is normal. The right ventricular  size is normal.   3. The mitral valve is grossly normal. No evidence of mitral valve regurgitation. No evidence of mitral stenosis.   4. The aortic valve is tricuspid. Aortic valve regurgitation is not visualized. No aortic stenosis is present.   Conclusion(s)/Recommendation(s): Normal biventricular function without evidence of hemodynamically significant valvular heart disease.   FINDINGS   Left Ventricle: Left ventricular ejection fraction, by estimation, is 60 to 65%. The left ventricle has normal function. The left ventricle has no regional wall motion abnormalities. Definity contrast agent was given IV to delineate the left ventricular  endocardial borders. The left ventricular internal cavity size was normal in size. There is no left ventricular hypertrophy. Left ventricular diastolic parameters were normal.    Right Ventricle: The right ventricular size is normal. No increase in  right ventricular wall thickness. Right ventricular systolic function is  normal.   Left Atrium: Left atrial size was normal in size.   Right Atrium: Right atrial size was normal in size.   Pericardium: Trivial pericardial effusion is present.   Mitral Valve: The mitral valve is grossly normal. No evidence of mitral  valve regurgitation. No evidence of mitral valve stenosis.   Tricuspid Valve: The tricuspid valve is grossly normal. Tricuspid valve  regurgitation is trivial. No evidence of tricuspid stenosis.   Aortic Valve: The aortic valve is tricuspid. Aortic valve regurgitation is  not visualized. No aortic stenosis is present.   Pulmonic Valve: The pulmonic valve was grossly normal. Pulmonic valve  regurgitation is trivial. No evidence of pulmonic stenosis.   Aorta: The aortic root and ascending aorta are structurally normal, with  no evidence of dilitation.   Venous: The right lower pulmonary vein is normal. The inferior vena cava  was not well visualized.   IAS/Shunts: The atrial septum is grossly normal.        CCTA 06/17/2021 FINDINGS: Image quality: Excellent.   Noise artifact is: Limited.   Coronary Arteries:  Normal coronary origin.  Left  dominance.   Left main: The left main is a large caliber vessel with a normal take off from the left coronary cusp that bifurcates to form a left anterior descending artery and a left circumflex artery. trifurcates into a LAD, LCX, and ramus intermedius. There is no plaque or stenosis.   Left anterior descending artery: The LAD is patent without evidence of plaque or stenosis. 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 The LAD gives off 3 patent diagonal branches. The LAD is large and wraps around the apex to supply the distal 1/3 of the posterior interventricular groove.   Left circumflex artery: The LCX is non-dominant and patent with  no evidence of plaque or stenosis. The LCX gives off 2 patent obtuse marginal branches.   Right coronary artery: The RCA is non-dominant with normal take off from the right coronary cusp. There is no evidence of plaque or stenosis.   Right Atrium: Right atrial size is within normal limits. An interatrial septal closure device is present without residual leak.   Right Ventricle: The right ventricular cavity is within normal limits.   Left Atrium: Left atrial size is normal in size with no left atrial appendage filling defect.   Left Ventricle: The ventricular cavity size is within normal limits. There are no stigmata of prior infarction. There is no abnormal filling defect.   Pulmonary arteries: Normal in size without proximal filling defect.   Pulmonary veins: Normal pulmonary venous drainage.   Pericardium: Normal thickness with no significant effusion or calcium present.   Cardiac valves: The aortic valve is trileaflet without significant calcification. The mitral valve is normal structure without significant calcification.   Aorta: Normal caliber with no significant disease.   Extra-cardiac findings: See attached radiology report for non-cardiac structures.   IMPRESSION: 1. Coronary calcium score of 0.   2. Normal coronary origin with left dominance.   3. Normal coronary arteries.   4. Mid LAD myocardial bridge (benign).   5. An interatrial septal closure device is present without residual leak.   RECOMMENDATIONS: 1. No evidence of CAD (0%). Consider non-atherosclerotic causes of chest pain.   Eleonore Chiquito, MD     Electronically Signed   By: Eleonore Chiquito M.D.   On: 06/17/2021 11:24    Addended by Geralynn Rile, MD on 06/17/2021 11:26 AM    Study Result   Narrative & Impression  EXAM: OVER-READ INTERPRETATION  CT CHEST   The following report is an over-read performed by radiologist Dr. Vinnie Langton of [facility], [place] on  06/17/2021. This over-read does not include interpretation of cardiac or coronary anatomy or pathology. The coronary calcium score/coronary CTA interpretation by the cardiologist is attached.   COMPARISON:  Chest CT 05/21/2020.   FINDINGS: Atherosclerotic calcifications in the thoracic aorta. Within the visualized portions of the thorax there are no suspicious appearing pulmonary nodules or masses, there is no acute consolidative airspace disease, no pleural effusions, no pneumothorax and no lymphadenopathy. Visualized portions of the upper abdomen are unremarkable. There are no aggressive appearing lytic or blastic lesions noted in the visualized portions of the skeleton.   IMPRESSION: 1.  Aortic Atherosclerosis (ICD10-I70.0).   Electronically Signed: By: Vinnie Langton M.D. On: 06/17/2021 09:02      Recent Labs: 05/28/2021: TSH 0.91 06/14/2021: Magnesium 1.5 12/31/2021: ALT 49; BUN 13; Creatinine 2.13; Potassium 3.9; Sodium 139 01/12/2022: Hemoglobin 11.6; Platelets 207  Recent Lipid Panel    Component Value Date/Time   CHOL 166 10/26/2021 1101  TRIG 172 (H) 10/26/2021 1101   HDL 74 10/26/2021 1101   CHOLHDL 2.2 10/26/2021 1101   LDLCALC 67 10/26/2021 1101    Physical Exam:    VS:  BP 128/80   Pulse 83   Ht '5\' 7"'$  (1.702 m)   Wt 212 lb 9.6 oz (96.4 kg)   SpO2 95%   BMI 33.30 kg/m     Wt Readings from Last 3 Encounters:  01/13/22 212 lb 9.6 oz (96.4 kg)  01/12/22 212 lb 12.8 oz (96.5 kg)  01/11/22 209 lb (94.8 kg)     GEN: Well nourished, well developed in no acute distress HEENT: Normal NECK: No JVD; No carotid bruits LYMPHATICS: No lymphadenopathy CARDIAC: S1S2 noted,RRR, no murmurs, rubs, gallops RESPIRATORY:  Clear to auscultation without rales, wheezing or rhonchi  ABDOMEN: Soft, non-tender, non-distended, +bowel sounds, no guarding. EXTREMITIES: No edema, No cyanosis, no clubbing MUSCULOSKELETAL:  No deformity  SKIN: Warm and dry NEUROLOGIC:  Alert  and oriented x 3, non-focal PSYCHIATRIC:  Normal affect, good insight  ASSESSMENT:    1. Orthostatic hypotension   2. Obesity (BMI 30-39.9)    PLAN:     She is orthostatic positive in the office today.  And currently is on midodrine 10 mg every 8 hours.  I like to start the patient on fludrocortisone 0.1 mg every 8 hours.  I am hoping this can help.  If this does not help I will also refer the patient to EP to consider loop recorder implantation. I will also ask her to discuss with neurology as well.  Could be worsening autonomic dysfunction. The patient understands the need to lose weight with diet and exercise. We have discussed specific strategies for this.  The patient is in agreement with the above plan. The patient left the office in stable condition.  The patient will follow up in 4 months due to medication change.  Addendum on March 24, 2022: Patient reports 100% compliance with her CPAP.  She is feeling much better.  Medication Adjustments/Labs and Tests Ordered: Current medicines are reviewed at length with the patient today.  Concerns regarding medicines are outlined above.  Orders Placed This Encounter  Procedures   EKG 12-Lead   Meds ordered this encounter  Medications   fludrocortisone (FLORINEF) 0.1 MG tablet    Sig: Take 1 tablet (0.1 mg total) by mouth 3 (three) times daily.    Dispense:  270 tablet    Refill:  3    Patient Instructions  Medication Instructions:  START: Fludocortisone 0.1 mg three times a day  *If you need a refill on your cardiac medications before your next appointment, please call your pharmacy*   Lab Work: None ordered today   Testing/Procedures: None ordered today   Follow-Up: At [facility], you and your health needs are our priority.  As part of our continuing mission to provide you with exceptional heart care, we have created designated Provider Care Teams.  These Care Teams include your primary Cardiologist (physician)  and Advanced Practice Providers (APPs -  Physician Assistants and Nurse Practitioners) who all work together to provide you with the care you need, when you need it.  We recommend signing up for the patient portal called "MyChart".  Sign up information is provided on this After Visit Summary.  MyChart is used to connect with patients for Virtual Visits (Telemedicine).  Patients are able to view lab/test results, encounter notes, upcoming appointments, etc.  Non-urgent messages can be sent to your provider as  well.   To learn more about what you can do with MyChart, go to [email or web address].    Your next appointment:   4 month(s)  The format for your next appointment:   In Person  Provider:   Berniece Salines, DO {         Adopting a Healthy Lifestyle.  Know what a healthy weight is for you (roughly BMI <25) and aim to maintain this   Aim for 7+ servings of fruits and vegetables daily   65-80+ fluid ounces of water or unsweet tea for healthy kidneys   Limit to max 1 drink of alcohol per day; avoid smoking/tobacco   Limit animal fats in diet for cholesterol and heart health - choose grass fed whenever available   Avoid highly processed foods, and foods high in saturated/trans fats   Aim for low stress - take time to unwind and care for your mental health   Aim for 150 min of moderate intensity exercise weekly for heart health, and weights twice weekly for bone health   Aim for 7-9 hours of sleep daily   When it comes to diets, agreement about the perfect plan isnt easy to find, even among the experts. Experts at the [employer] developed an idea known as the Healthy Eating Plate. Just imagine a plate divided into logical, healthy portions.   The emphasis is on diet quality:   Load up on vegetables and fruits - one-half of your plate: Aim for color and variety, and remember that potatoes dont count.   Go for whole grains - one-quarter of your plate:  Whole wheat, barley, wheat berries, quinoa, oats, brown rice, and foods made with them. If you want pasta, go with whole wheat pasta.   Protein power - one-quarter of your plate: Fish, chicken, beans, and nuts are all healthy, versatile protein sources. Limit red meat.   The diet, however, does go beyond the plate, offering a few other suggestions.   Use healthy plant oils, such as olive, canola, soy, corn, sunflower and peanut. Check the labels, and avoid partially hydrogenated oil, which have unhealthy trans fats.   If youre thirsty, drink water. Coffee and tea are good in moderation, but skip sugary drinks and limit milk and dairy products to one or two daily servings.   The type of carbohydrate in the diet is more important than the amount. Some sources of carbohydrates, such as vegetables, fruits, whole grains, and beans-are healthier than others.   Finally, stay active  Signed, Brandy Salines, DO  01/17/2022 8:34 AM    [place]

## 2022-01-13 NOTE — Patient Instructions (Signed)
Medication Instructions:  START: Fludocortisone 0.1 mg three times a day  *If you need a refill on your cardiac medications before your next appointment, please call your pharmacy*   Lab Work: None ordered today   Testing/Procedures: None ordered today   Follow-Up: At [facility], you and your health needs are our priority.  As part of our continuing mission to provide you with exceptional heart care, we have created designated Provider Care Teams.  These Care Teams include your primary Cardiologist (physician) and Advanced Practice Providers (APPs -  Physician Assistants and Nurse Practitioners) who all work together to provide you with the care you need, when you need it.  We recommend signing up for the patient portal called "MyChart".  Sign up information is provided on this After Visit Summary.  MyChart is used to connect with patients for Virtual Visits (Telemedicine).  Patients are able to view lab/test results, encounter notes, upcoming appointments, etc.  Non-urgent messages can be sent to your provider as well.   To learn more about what you can do with MyChart, go to [email or web address].    Your next appointment:   4 month(s)  The format for your next appointment:   In Person  Provider:   Berniece Salines, DO {

## 2022-01-14 LAB — COMPLIANCE DRUG ANALYSIS, UR

## 2022-01-18 ENCOUNTER — Other Ambulatory Visit: Payer: Self-pay | Admitting: Hematology

## 2022-01-18 DIAGNOSIS — C50919 Malignant neoplasm of unspecified site of unspecified female breast: Secondary | ICD-10-CM

## 2022-01-20 ENCOUNTER — Other Ambulatory Visit: Payer: Self-pay | Admitting: Family

## 2022-01-21 NOTE — Telephone Encounter (Signed)
Patient has request refill on medication Rosuvastatin '10mg'$ . Patient medication has end date. Medication pend and sent to PCP Ngetich, Nelda Bucks, NP for approval.

## 2022-01-26 ENCOUNTER — Telehealth: Payer: Self-pay

## 2022-01-26 DIAGNOSIS — H903 Sensorineural hearing loss, bilateral: Secondary | ICD-10-CM

## 2022-01-26 NOTE — Telephone Encounter (Signed)
Referral to [facility] ordered.

## 2022-01-26 NOTE — Telephone Encounter (Signed)
Patient called stating that she was given that number to [facility], she called to schedule appointment,but needs referral. They can see her Tuesday at 9,but they needs referral sent to them.  Message routed to Marlowe Sax, NP

## 2022-01-28 ENCOUNTER — Other Ambulatory Visit: Payer: Self-pay

## 2022-01-28 ENCOUNTER — Inpatient Hospital Stay: Payer: Medicare Other | Attending: Hematology

## 2022-01-28 ENCOUNTER — Inpatient Hospital Stay: Payer: Medicare Other

## 2022-01-28 ENCOUNTER — Other Ambulatory Visit: Payer: Self-pay | Admitting: *Deleted

## 2022-01-28 VITALS — BP 117/60 | HR 83 | Temp 97.6°F | Resp 18

## 2022-01-28 DIAGNOSIS — Z5111 Encounter for antineoplastic chemotherapy: Secondary | ICD-10-CM | POA: Diagnosis not present

## 2022-01-28 DIAGNOSIS — K219 Gastro-esophageal reflux disease without esophagitis: Secondary | ICD-10-CM | POA: Insufficient documentation

## 2022-01-28 DIAGNOSIS — I700 Atherosclerosis of aorta: Secondary | ICD-10-CM | POA: Insufficient documentation

## 2022-01-28 DIAGNOSIS — M4802 Spinal stenosis, cervical region: Secondary | ICD-10-CM | POA: Diagnosis not present

## 2022-01-28 DIAGNOSIS — Z833 Family history of diabetes mellitus: Secondary | ICD-10-CM | POA: Diagnosis not present

## 2022-01-28 DIAGNOSIS — Z801 Family history of malignant neoplasm of trachea, bronchus and lung: Secondary | ICD-10-CM | POA: Diagnosis not present

## 2022-01-28 DIAGNOSIS — Z170 Estrogen receptor positive status [ER+]: Secondary | ICD-10-CM | POA: Diagnosis not present

## 2022-01-28 DIAGNOSIS — Z8249 Family history of ischemic heart disease and other diseases of the circulatory system: Secondary | ICD-10-CM | POA: Insufficient documentation

## 2022-01-28 DIAGNOSIS — K760 Fatty (change of) liver, not elsewhere classified: Secondary | ICD-10-CM | POA: Insufficient documentation

## 2022-01-28 DIAGNOSIS — C50919 Malignant neoplasm of unspecified site of unspecified female breast: Secondary | ICD-10-CM

## 2022-01-28 DIAGNOSIS — C50511 Malignant neoplasm of lower-outer quadrant of right female breast: Secondary | ICD-10-CM | POA: Diagnosis not present

## 2022-01-28 DIAGNOSIS — Z8261 Family history of arthritis: Secondary | ICD-10-CM | POA: Insufficient documentation

## 2022-01-28 DIAGNOSIS — Z87891 Personal history of nicotine dependence: Secondary | ICD-10-CM | POA: Diagnosis not present

## 2022-01-28 DIAGNOSIS — M5031 Other cervical disc degeneration,  high cervical region: Secondary | ICD-10-CM | POA: Insufficient documentation

## 2022-01-28 LAB — CBC WITH DIFFERENTIAL (CANCER CENTER ONLY)
Abs Immature Granulocytes: 0.01 10*3/uL (ref 0.00–0.07)
Basophils Absolute: 0 10*3/uL (ref 0.0–0.1)
Basophils Relative: 1 %
Eosinophils Absolute: 0.1 10*3/uL (ref 0.0–0.5)
Eosinophils Relative: 1 %
HCT: 30.9 % — ABNORMAL LOW (ref 36.0–46.0)
Hemoglobin: 10.2 g/dL — ABNORMAL LOW (ref 12.0–15.0)
Immature Granulocytes: 0 %
Lymphocytes Relative: 51 %
Lymphs Abs: 2.2 10*3/uL (ref 0.7–4.0)
MCH: 35.1 pg — ABNORMAL HIGH (ref 26.0–34.0)
MCHC: 33 g/dL (ref 30.0–36.0)
MCV: 106.2 fL — ABNORMAL HIGH (ref 80.0–100.0)
Monocytes Absolute: 0.2 10*3/uL (ref 0.1–1.0)
Monocytes Relative: 6 %
Neutro Abs: 1.8 10*3/uL (ref 1.7–7.7)
Neutrophils Relative %: 41 %
Platelet Count: 171 10*3/uL (ref 150–400)
RBC: 2.91 MIL/uL — ABNORMAL LOW (ref 3.87–5.11)
RDW: 14.9 % (ref 11.5–15.5)
WBC Count: 4.3 10*3/uL (ref 4.0–10.5)
nRBC: 0 % (ref 0.0–0.2)

## 2022-01-28 LAB — CMP (CANCER CENTER ONLY)
ALT: 43 U/L (ref 0–44)
AST: 91 U/L — ABNORMAL HIGH (ref 15–41)
Albumin: 3.4 g/dL — ABNORMAL LOW (ref 3.5–5.0)
Alkaline Phosphatase: 200 U/L — ABNORMAL HIGH (ref 38–126)
Anion gap: 4 — ABNORMAL LOW (ref 5–15)
BUN: 11 mg/dL (ref 6–20)
CO2: 35 mmol/L — ABNORMAL HIGH (ref 22–32)
Calcium: 9 mg/dL (ref 8.9–10.3)
Chloride: 103 mmol/L (ref 98–111)
Creatinine: 1.73 mg/dL — ABNORMAL HIGH (ref 0.44–1.00)
GFR, Estimated: 34 mL/min — ABNORMAL LOW (ref >60)
Glucose, Bld: 124 mg/dL — ABNORMAL HIGH (ref 70–99)
Potassium: 3.5 mmol/L (ref 3.5–5.1)
Sodium: 142 mmol/L (ref 135–145)
Total Bilirubin: 0.6 mg/dL (ref 0.3–1.2)
Total Protein: 6.5 g/dL (ref 6.5–8.1)

## 2022-01-28 MED ORDER — FULVESTRANT 250 MG/5ML IM SOSY
500.0000 mg | PREFILLED_SYRINGE | Freq: Once | INTRAMUSCULAR | Status: AC
  Administered 2022-01-28: 500 mg via INTRAMUSCULAR
  Filled 2022-01-28: qty 10

## 2022-02-02 ENCOUNTER — Ambulatory Visit (INDEPENDENT_AMBULATORY_CARE_PROVIDER_SITE_OTHER): Payer: Medicare Other | Admitting: Specialist

## 2022-02-02 ENCOUNTER — Encounter: Payer: Self-pay | Admitting: Specialist

## 2022-02-02 VITALS — BP 135/82 | HR 71 | Ht 67.0 in | Wt 213.0 lb

## 2022-02-02 DIAGNOSIS — M4310 Spondylolisthesis, site unspecified: Secondary | ICD-10-CM

## 2022-02-02 DIAGNOSIS — I951 Orthostatic hypotension: Secondary | ICD-10-CM | POA: Diagnosis not present

## 2022-02-02 DIAGNOSIS — M4316 Spondylolisthesis, lumbar region: Secondary | ICD-10-CM

## 2022-02-02 DIAGNOSIS — R296 Repeated falls: Secondary | ICD-10-CM

## 2022-02-02 NOTE — Patient Instructions (Signed)
Plan: Avoid bending, stooping and avoid lifting weights greater than 10 lbs. Avoid prolong standing and walking. Avoid frequent bending and stooping  No lifting greater than 10 lbs. May use ice or moist heat for pain. Weight loss is of benefit. Handicap license is approved. Dr. Elwyn Lade secretary/Assistant will call to arrange for epidural steroid injection

## 2022-02-02 NOTE — Progress Notes (Addendum)
Office Visit Note   Patient: Brandy [name]           Date of Birth: 1966/04/17           MRN: [id number] Visit Date: 02/02/2022              Requested by: Sandrea Hughs, NP [address] [place],  [place] [address] PCP: Sandrea Hughs, NP   Assessment & Plan: Visit Diagnoses:  1. Spondylolisthesis of lumbar region   2. Grade 1 Anterolisthesis (L2-3, L3-4)   3. Grade 1 Retrolisthesis (L4-5, L5-S1)   4. Falls frequently   5. Orthostatic hypotension     Plan: Avoid bending, stooping and avoid lifting weights greater than 10 lbs. Avoid prolong standing and walking. Avoid frequent bending and stooping  No lifting greater than 10 lbs. May use ice or moist heat for pain. Weight loss is of benefit. Handicap license is approved. Dr. Elwyn Lade secretary/Assistant will call to arrange for epidural steroid injection    Follow-Up Instructions: No follow-ups on file.   Orders:  No orders of the defined types were placed in this encounter.  No orders of the defined types were placed in this encounter.     Procedures: No procedures performed   Clinical Data: No additional findings.   Subjective: Chief Complaint  Patient presents with   Lower Back - Pain    56 year old female with history of multiple level degenerative disc disease and she is now going to Dr. Eduard Clos and he has started buprenorphine and a  Muscle relaxer. She is to consider RFA. She reports that she is feeling more back pain today and may have done more than she should lately. She is having blood pressure issues with orthostatic changes and recently was started on fludrocortisone. Heart rate also is labile.    Review of Systems  Constitutional:  Positive for fatigue.  HENT: Negative.    Eyes: Negative.   Respiratory: Negative.    Cardiovascular: Negative.   Gastrointestinal: Negative.   Endocrine: Negative.   Genitourinary: Negative.   Musculoskeletal: Negative.   Skin: Negative.    Allergic/Immunologic: Negative.   Neurological: Negative.   Hematological: Negative.   Psychiatric/Behavioral: Negative.       Objective: Vital Signs: BP 135/82 (BP Location: Left Arm, Patient Position: Sitting)   Pulse 71   Ht '5\' 7"'$  (1.702 m)   Wt 213 lb (96.6 kg)   BMI 33.36 kg/m   Physical Exam Constitutional:      Appearance: She is well-developed.  HENT:     Head: Normocephalic and atraumatic.  Eyes:     Pupils: Pupils are equal, round, and reactive to light.  Pulmonary:     Effort: Pulmonary effort is normal.     Breath sounds: Normal breath sounds.  Abdominal:     General: Bowel sounds are normal.     Palpations: Abdomen is soft.  Musculoskeletal:     Cervical back: Normal range of motion and neck supple.     Lumbar back: Negative right straight leg raise test and negative left straight leg raise test.  Skin:    General: Skin is warm and dry.  Neurological:     Mental Status: She is alert and oriented to person, place, and time.  Psychiatric:        Behavior: Behavior normal.        Thought Content: Thought content normal.        Judgment: Judgment normal.  Back Exam   Tenderness  The patient is experiencing tenderness in the lumbar.  Range of Motion  Extension:  abnormal  Flexion:  abnormal  Lateral bend right:  abnormal  Lateral bend left:  abnormal  Rotation right:  abnormal  Rotation left:  abnormal   Tests  Straight leg raise right: negative Straight leg raise left: negative  Other  Toe walk: normal Heel walk: normal Sensation: normal      Specialty Comments:  MRI LUMBAR SPINE WITHOUT CONTRAST   TECHNIQUE: Multiplanar, multisequence MR imaging of the lumbar spine was performed. No intravenous contrast was administered.   COMPARISON:  MR lumbar 01/13/2017; X-ray lumbar 08/26/2021.   FINDINGS: Segmentation:  5 lumbar vertebra   Alignment: 4 mm anterolisthesis L2-3 is unchanged. 2 mm anterolisthesis L3-4 unchanged. 4 mm  retrolisthesis L4-5 and L5-S1 also unchanged.   Vertebrae: Negative for fracture or mass. Normal appearing bone marrow.   Conus medullaris and cauda equina: Conus extends to the L2-3 level. Conus and cauda equina appear normal.   Paraspinal and other soft tissues: Negative for paraspinous mass, adenopathy, fluid collection   Disc levels:   L1-2: Minimal disc degeneration. Moderate to advanced facet degeneration. Interval development of posterior epidural synovial cyst measuring 7 x 8 mm. This flattens the posterior thecal sac but is not causing significant spinal stenosis. Generous size spinal canal.   L2-3: Anterolisthesis. Moderate to advanced facet degeneration bilaterally. No significant stenosis   L3-4: Mild anterolisthesis. Shallow right foraminal disc protrusion unchanged from the prior study. Mild disc degeneration and moderate facet degeneration. Negative for stenosis.   L4-5: Left laminotomy. Diffuse bulging of the disc and mild facet degeneration. Mild left subarticular stenosis, unchanged. Spinal canal adequate in size   L5-S1: Left laminotomy. Retrolisthesis with disc degeneration and spurring. Bilateral facet degeneration. Mild subarticular stenosis bilaterally. No interval change.   IMPRESSION: Postop laminectomy left L4-5 and L5-S1. No recurrent disc protrusion. Subarticular stenosis left L4-5 unchanged. Subarticular stenosis bilaterally L5-S1 unchanged   Multilevel facet degeneration. Interval development of 7 x 8 mm synovial cyst at L1-2 projecting into the posterior epidural space in the midline. This is not causing significant spinal stenosis.     Electronically Signed   By: Franchot Gallo M.D.   On: 09/29/2021 10:49  Imaging: No results found.   PMFS History: Patient Active Problem List   Diagnosis Date Noted   Spinal stenosis, 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    Priority: High    Class: Chronic   Left shoulder  tendonitis 05/23/2016    Priority: High    Class: Acute   Snoring 07/14/2021   Fatigue 07/14/2021   Daytime somnolence 07/14/2021   Status post device closure of ASD 06/03/2021   Secundum ASD 06/03/2021   Decreased diffusion capacity 05/14/2021   Shortness of breath 03/04/2021   Restrictive lung disease 04/01/2020   Pulmonary nodules 04/01/2020   Elevated LFTs 03/26/2020   Primary malignant neoplasm of breast with metastasis ([place]) 02/24/2020   Spondylosis without myelopathy or radiculopathy, lumbosacral region 12/31/2019   Chronic low back pain (Bilateral) w/o sciatica 12/31/2019   Chronic hip pain (3ry area of Pain) (Bilateral) (R>L) 11/27/2019   Chronic sacroiliac joint pain (Left) 11/27/2019   Chronic pain syndrome 11/11/2019   Pharmacologic therapy 11/11/2019   Disorder of skeletal system 11/11/2019   Problems influencing health status 11/11/2019   Abnormal MRI, lumbar spine (08/22/2018) 11/11/2019   Chronic low back pain (1ry area of Pain) (Bilateral) w/ sciatica (Bilateral) 11/11/2019  DDD (degenerative disc disease), lumbosacral 11/11/2019   Grade 1 Anterolisthesis (L2-3, L3-4) 11/11/2019   Grade 1 Retrolisthesis (L4-5, L5-S1) 11/11/2019   Lumbar facet hypertrophy (Multilevel) (Bilateral) 11/11/2019   Lumbar facet syndrome (Multilevel) (Bilateral) (R>L) 11/11/2019   Chronic lower extremity pain (2ry area of Pain) (Bilateral) (R>L) 11/11/2019   Neurogenic pain 11/11/2019   Chronic musculoskeletal pain 11/11/2019   Infiltrating ductal carcinoma of breast ([place]) 10/08/2019   Diarrhea 09/04/2019   Fatty liver 09/04/2019   Nausea & vomiting 09/04/2019   Obesity (BMI 30-39.9) 09/04/2019   GERD (gastroesophageal reflux disease)    Hyperlipidemia    CKD (chronic kidney disease), stage III (HCC)    Depression    Malignant hypertension    Transaminitis    Pneumonitis 08/04/2019   Breast cancer ([place]) 08/04/2019   Tachycardia    Ocular proptosis 02/14/2019   PVD (posterior  vitreous detachment), left eye 02/14/2019   Chorioretinal scar of left eye 02/14/2019   Vitreous floaters of left eye 02/14/2019   Bilateral leg weakness 01/24/2019   Insomnia 08/02/2017   Low back pain radiating to both legs 08/02/2017   Migraine 08/02/2017   Neuropathy 08/02/2017   Post laminectomy syndrome 05/23/2016   Anxiety disorder 01/07/2015   Past Medical History:  Diagnosis Date   Acute pansinusitis 08/02/2017   Anxiety    Arthritis    ASD (atrial septal defect)    s/p closure with Amplatzer device 10/05/04 (Dr. Myriam Jacobson, [facility]) 10/05/04   Cancer (HCC)    Cataract    Dyspnea    GERD (gastroesophageal reflux disease)    Heart murmur    no longer heard   History of hiatal hernia    Legally blind in right eye, as defined in [place]    Lumbar herniated disc    PONV (postoperative nausea and vomiting)    Sciatica     Family History  Problem Relation Age of Onset   Arthritis Mother    Hypertension Mother    Diabetes Mother    High blood pressure Mother    Cancer Father        Lung   Colon cancer Neg Hx    Colon polyps Neg Hx    Esophageal cancer Neg Hx    Rectal cancer Neg Hx    Stomach cancer Neg Hx     Past Surgical History:  Procedure Laterality Date   ABLATION     BREAST LUMPECTOMY WITH RADIOACTIVE SEED AND SENTINEL LYMPH NODE BIOPSY Right 11/23/2020   Procedure: RIGHT BREAST LUMPECTOMY WITH RADIOACTIVE SEED AND RIGHT AXILLARY SENTINEL LYMPH NODE BIOPSY;  Surgeon: Rolm Bookbinder, MD;  Location: [place];  Service: General;  Laterality: Right;   BREAST SURGERY Bilateral 2011   Breast Reduction Surgery   BUNIONECTOMY     CARDIAC CATHETERIZATION     10/05/04 [facility]): LM < 25%, otherwise normal coronaries. No pulmonary HTN, Mildly enlarged RV. Secundum ASD s/p closure.   CARDIAC SURGERY     CATARACT EXTRACTION     CLEFT PALATE REPAIR     s/p cleft lip and palate repair   EYE SURGERY Right 2019   right eye removed   LUMBAR LAMINECTOMY/DECOMPRESSION  MICRODISCECTOMY Left 05/23/2016   Procedure: LEFT L4-L5 LATERAL RECESS DECOMPRESSION WITH CENTRAL AND RIGHT DECOMPRESSION VIA LEFT SIDE;  Surgeon: [name], MD;  Location: [place];  Service: Orthopedics;  Laterality: Left;   LUMBAR LAMINECTOMY/DECOMPRESSION MICRODISCECTOMY Left 05/23/2016   Procedure: LUMBAR LAMINECTOMY/DECOMPRESSION MICRODISCECTOMY Lumbar five - Sacral One 1 LEVEL;  Surgeon: [name], MD;  Location: [place];  Service: Orthopedics;  Laterality: Left;   REDUCTION MAMMAPLASTY  2011   SHOULDER INJECTION Left 05/23/2016   Procedure: SHOULDER INJECTION;  Surgeon: [name], MD;  Location: [place];  Service: Orthopedics;  Laterality: Left;  band-aid per pa-c   TRANSTHORACIC ECHOCARDIOGRAM     12/15/05 [facility]): Mild LVH, EF > 54%, grade 1 diastolic dysfunction, Trivial MR/PR/TR.   TUBAL LIGATION     Social History   Occupational History   Not on file  Tobacco Use   Smoking status: Former    Packs/day: 0.25    Years: 25.00    Total pack years: 6.25    Types: Cigarettes    Quit date: 02/04/2018    Years since quitting: 3.9   Smokeless tobacco: Never  Vaping Use   Vaping Use: Never used  Substance and Sexual Activity   Alcohol use: No   Drug use: No   Sexual activity: Not on file

## 2022-02-10 DIAGNOSIS — G4733 Obstructive sleep apnea (adult) (pediatric): Secondary | ICD-10-CM | POA: Diagnosis not present

## 2022-02-11 ENCOUNTER — Other Ambulatory Visit: Payer: Self-pay | Admitting: Pulmonary Disease

## 2022-02-13 ENCOUNTER — Other Ambulatory Visit: Payer: Self-pay | Admitting: Hematology

## 2022-02-13 DIAGNOSIS — C50919 Malignant neoplasm of unspecified site of unspecified female breast: Secondary | ICD-10-CM

## 2022-02-14 ENCOUNTER — Encounter: Payer: Self-pay | Admitting: Hematology

## 2022-02-23 DIAGNOSIS — H5212 Myopia, left eye: Secondary | ICD-10-CM | POA: Diagnosis not present

## 2022-02-23 DIAGNOSIS — H35352 Cystoid macular degeneration, left eye: Secondary | ICD-10-CM | POA: Diagnosis not present

## 2022-02-23 DIAGNOSIS — H04122 Dry eye syndrome of left lacrimal gland: Secondary | ICD-10-CM | POA: Diagnosis not present

## 2022-02-23 DIAGNOSIS — Z961 Presence of intraocular lens: Secondary | ICD-10-CM | POA: Diagnosis not present

## 2022-02-24 ENCOUNTER — Other Ambulatory Visit: Payer: Self-pay

## 2022-02-24 DIAGNOSIS — C50919 Malignant neoplasm of unspecified site of unspecified female breast: Secondary | ICD-10-CM

## 2022-02-25 ENCOUNTER — Inpatient Hospital Stay: Payer: Medicare Other | Attending: Hematology | Admitting: Hematology

## 2022-02-25 ENCOUNTER — Inpatient Hospital Stay: Payer: Medicare Other

## 2022-03-01 ENCOUNTER — Encounter: Payer: Self-pay | Admitting: Specialist

## 2022-03-04 ENCOUNTER — Telehealth: Payer: Self-pay | Admitting: Family

## 2022-03-04 DIAGNOSIS — H1789 Other corneal scars and opacities: Secondary | ICD-10-CM | POA: Diagnosis not present

## 2022-03-04 DIAGNOSIS — H04122 Dry eye syndrome of left lacrimal gland: Secondary | ICD-10-CM | POA: Diagnosis not present

## 2022-03-04 DIAGNOSIS — H338 Other retinal detachments: Secondary | ICD-10-CM | POA: Diagnosis not present

## 2022-03-04 DIAGNOSIS — H43392 Other vitreous opacities, left eye: Secondary | ICD-10-CM | POA: Diagnosis not present

## 2022-03-04 DIAGNOSIS — H16052 Mooren's corneal ulcer, left eye: Secondary | ICD-10-CM | POA: Diagnosis not present

## 2022-03-04 DIAGNOSIS — H35352 Cystoid macular degeneration, left eye: Secondary | ICD-10-CM | POA: Diagnosis not present

## 2022-03-04 DIAGNOSIS — H30112 Disseminated chorioretinal inflammation of posterior pole, left eye: Secondary | ICD-10-CM | POA: Diagnosis not present

## 2022-03-04 DIAGNOSIS — H16402 Unspecified corneal neovascularization, left eye: Secondary | ICD-10-CM | POA: Diagnosis not present

## 2022-03-04 NOTE — Telephone Encounter (Signed)
Daughter notified and agreed.  Paperwork left up front for patient to pick up.   Copy sent to scanning.

## 2022-03-04 NOTE — Telephone Encounter (Signed)
[employer] form completed please fax or patient can pick it up if needed.

## 2022-03-07 ENCOUNTER — Telehealth: Payer: Self-pay | Admitting: Cardiovascular Disease

## 2022-03-07 DIAGNOSIS — N1831 Chronic kidney disease, stage 3a: Secondary | ICD-10-CM | POA: Diagnosis not present

## 2022-03-07 DIAGNOSIS — N390 Urinary tract infection, site not specified: Secondary | ICD-10-CM | POA: Diagnosis not present

## 2022-03-07 DIAGNOSIS — I951 Orthostatic hypotension: Secondary | ICD-10-CM | POA: Diagnosis not present

## 2022-03-07 DIAGNOSIS — C50919 Malignant neoplasm of unspecified site of unspecified female breast: Secondary | ICD-10-CM | POA: Diagnosis not present

## 2022-03-07 NOTE — Telephone Encounter (Signed)
Patient has been contacted 3x in regards to scheduling her sleep compliance appt with Dr Claiborne Billings. I have been unable to reach patient or receive a call back.

## 2022-03-08 ENCOUNTER — Encounter: Payer: Self-pay | Admitting: Student in an Organized Health Care Education/Training Program

## 2022-03-08 ENCOUNTER — Ambulatory Visit
Payer: Medicare Other | Attending: Student in an Organized Health Care Education/Training Program | Admitting: Student in an Organized Health Care Education/Training Program

## 2022-03-08 VITALS — BP 133/67 | HR 103 | Temp 97.2°F | Ht 67.0 in | Wt 213.0 lb

## 2022-03-08 DIAGNOSIS — G894 Chronic pain syndrome: Secondary | ICD-10-CM | POA: Diagnosis not present

## 2022-03-08 DIAGNOSIS — M545 Low back pain, unspecified: Secondary | ICD-10-CM | POA: Diagnosis not present

## 2022-03-08 DIAGNOSIS — M961 Postlaminectomy syndrome, not elsewhere classified: Secondary | ICD-10-CM | POA: Diagnosis not present

## 2022-03-08 DIAGNOSIS — G8929 Other chronic pain: Secondary | ICD-10-CM | POA: Diagnosis not present

## 2022-03-08 DIAGNOSIS — M47816 Spondylosis without myelopathy or radiculopathy, lumbar region: Secondary | ICD-10-CM | POA: Insufficient documentation

## 2022-03-08 DIAGNOSIS — M4310 Spondylolisthesis, site unspecified: Secondary | ICD-10-CM | POA: Diagnosis not present

## 2022-03-08 MED ORDER — BELBUCA 150 MCG BU FILM: 1.0000 | ORAL_FILM | Freq: Two times a day (BID) | BUCCAL | 2 refills | Status: AC

## 2022-03-08 NOTE — Progress Notes (Signed)
Nursing Pain Medication Assessment:  Safety precautions to be maintained throughout the outpatient stay will include: orient to surroundings, keep bed in low position, maintain call bell within reach at all times, provide assistance with transfer out of bed and ambulation.  Medication Inspection Compliance: Pill count conducted under aseptic conditions, in front of the patient. Neither the pills nor the bottle was removed from the patient's sight at any time. Once count was completed pills were immediately returned to the patient in their original bottle.  Medication: Buprenorphine (Suboxone) Pill/Patch Count:  3 of 60 pills remain Pill/Patch Appearance: Markings consistent with prescribed medication Bottle Appearance: Standard pharmacy container. Clearly labeled. Filled Date: 8 / 24 / 2023 Last Medication intake:  TodaySafety precautions to be maintained throughout the outpatient stay will include: orient to surroundings, keep bed in low position, maintain call bell within reach at all times, provide assistance with transfer out of bed and ambulation.

## 2022-03-08 NOTE — Progress Notes (Signed)
PROVIDER NOTE: Information contained herein reflects review and annotations entered in association with encounter. Interpretation of such information and data should be left to medically-trained personnel. Information provided to patient can be located elsewhere in the medical record under "Patient Instructions". Document created using STT-dictation technology, any transcriptional errors that may result from process are unintentional.    Patient: Brandy [name]  Service Category: E/M  Provider: Gillis Santa, MD  DOB: 02-18-1966  DOS: 03/08/2022  Referring Provider: Sandrea Hughs, NP  MRN: [id number]  Specialty: Interventional Pain Management  PCP: Sandrea Hughs, NP  Type: Established Patient  Setting: Ambulatory outpatient    Location: Office  Delivery: Face-to-face     HPI  Brandy [name], a 56 y.o. year old female, is here today because of her Facet hypertrophy of lumbar region [M47.816]. Brandy [name] primary complain today is Back Pain (lower) Last encounter: My last encounter with her was on 01/11/2022. Pertinent problems: Brandy [name] has Spinal stenosis, lumbar region with neurogenic claudication; Post laminectomy syndrome; CKD (chronic kidney disease), stage III ([place]); Depression; Chronic pain syndrome; DDD (degenerative disc disease), lumbosacral; Grade 1 Retrolisthesis (L4-5, L5-S1); Lumbar facet syndrome (Multilevel) (Bilateral) (R>L); Neurogenic pain; and Spondylosis without myelopathy or radiculopathy, lumbosacral region on their pertinent problem list. Pain Assessment: Severity of Chronic pain is reported as a 1 /10. Location: Back Right, Left/Denies. Onset: More than a month ago. Quality: Aching, Burning, Constant, Throbbing. Timing: Constant. Modifying factor(s): Meds and laying down. Vitals:  height is $RemoveB'5\' 7"'[id number]$  (1.702 m) and weight is 213 lb (96.6 kg). Her temperature is 97.2 F (36.2 C) (abnormal). Her blood pressure is 133/67 and  her pulse is 103 (abnormal). Her oxygen saturation is 98%.   Reason for encounter: medication management.   Patient presents today for her second patient visit.  She is finding benefit with belbuca but states that it is strong and that she would like to reduce the dose.  We will reduce the dose to 150 mcg twice daily.   HPI from initial clinic visit: Ms. Davis [name] is a pleasant 56 year old female who presents with a chief complaint of low back pain with radiation down to her posterior buttocks and thighs.  She has a history of lumbar postlaminectomy pain syndrome.  Of note I have seen her in the past, almost 2 years ago for a therapeutic bilateral L2, L3, L4, L5 facet medial branch nerve block.  She obtained 100% pain relief from this however 6 months after where she sustained a fall which increased her low back pain.  She was unable to get back into the  pain clinic due to personal issues.  She states that she has been seen by physical medicine and rehab physician.  She has also been evaluated by orthopedic spine.  She is currently on oxycodone 2.5 to 5 mg daily as needed for breakthrough pain.  She follows up today hoping that she can repeat her bilateral L2, L3, L4, L5 medial branch nerve block that was done back in 03/02/2020.  Future considerations after this would include lumbar radiofrequency ablation.   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   In regards to medication management in the context of her obstructive sleep apnea and the fact that she is on chronic benzodiazepine therapy with Xanax.  I informed her that she would only be a candidate for buprenorphine by way of belbuca.  Risk and benefits  were reviewed of this medication.  We will obtain a baseline urine toxicology screen, and I will initiate her belbuca at 300 mcg twice daily for the first 4 weeks and then titration instructions to 450 mcg  twice daily.  Pharmacotherapy Assessment  Analgesic: Belbuca 150 mcg twice daily  Monitoring: [place] PMP: PDMP not reviewed this encounter.       Pharmacotherapy: No side-effects or adverse reactions reported. Compliance: No problems identified. Effectiveness: Clinically acceptable.  Chauncey Fischer, RN  03/08/2022 10:04 AM  Sign when Signing Visit Nursing Pain Medication Assessment:  Safety precautions to be maintained throughout the outpatient stay will include: orient to surroundings, keep bed in low position, maintain call bell within reach at all times, provide assistance with transfer out of bed and ambulation.  Medication Inspection Compliance: Pill count conducted under aseptic conditions, in front of the patient. Neither the pills nor the bottle was removed from the patient's sight at any time. Once count was completed pills were immediately returned to the patient in their original bottle.  Medication: Buprenorphine (Suboxone) Pill/Patch Count:  3 of 60 pills remain Pill/Patch Appearance: Markings consistent with prescribed medication Bottle Appearance: Standard pharmacy container. Clearly labeled. Filled Date: 8 / 51 / 2023 Last Medication intake:  TodaySafety precautions to be maintained throughout the outpatient stay will include: orient to surroundings, keep bed in low position, maintain call bell within reach at all times, provide assistance with transfer out of bed and ambulation.   No results found for: "CBDTHCR" No results found for: "D8THCCBX" No results found for: "D9THCCBX"  UDS:  Summary  Date Value Ref Range Status  01/11/2022 Note  Final    Comment:    ==================================================================== Compliance Drug Analysis, Ur ==================================================================== Test                             Result       Flag       Units  Drug Present and Declared for Prescription Verification   Alprazolam                      15           EXPECTED   ng/mg creat   Alpha-hydroxyalprazolam        218          EXPECTED   ng/mg creat    Source of alprazolam is a scheduled prescription medication. Alpha-    hydroxyalprazolam is an expected metabolite of alprazolam.    Oxycodone                      39           EXPECTED   ng/mg creat   Oxymorphone                    317          EXPECTED   ng/mg creat   Noroxycodone                   77           EXPECTED   ng/mg creat   Noroxymorphone                 95           EXPECTED   ng/mg creat    Sources of oxycodone are scheduled prescription medications.    Oxymorphone,  noroxycodone, and noroxymorphone are expected    metabolites of oxycodone. Oxymorphone is also available as a    scheduled prescription medication.    Amitriptyline                  PRESENT      EXPECTED   Nortriptyline                  PRESENT      EXPECTED    Nortriptyline is an expected metabolite of amitriptyline.  Drug Present not Declared for Prescription Verification   Desmethyldiazepam              14           UNEXPECTED ng/mg creat   Oxazepam                       64           UNEXPECTED ng/mg creat   Temazepam                      67           UNEXPECTED ng/mg creat    Desmethyldiazepam, oxazepam, and temazepam are benzodiazepine drugs,    but may also be present as common metabolites of other    benzodiazepine drugs, including diazepam.    Salicylate                     PRESENT      UNEXPECTED  Drug Absent but Declared for Prescription Verification   Buprenorphine                  Not Detected UNEXPECTED ng/mg creat    Low dose buprenorphine is not always detected even when used as    directed.    Methocarbamol                  Not Detected UNEXPECTED ==================================================================== Test                      Result    Flag   Units      Ref Range   Creatinine              175              mg/dL       >=20 ==================================================================== Declared Medications:  The flagging and interpretation on this report are based on the  following declared medications.  Unexpected results may arise from  inaccuracies in the declared medications.   **Note: The testing scope of this panel includes these medications:   Alprazolam (Xanax)  Amitriptyline (Elavil)  Methocarbamol (Robaxin)  Oxycodone (OxyIR)   **Note: The testing scope of this panel does not include small to  moderate amounts of these reported medications:   Buprenorphine (Belbuca)   **Note: The testing scope of this panel does not include the  following reported medications:   Abemaciclib (Verzenio)  Albuterol (Proventil HFA)  Atropine (Lomotil)  Diphenoxylate (Lomotil)  Eye Drops  Fluticasone (Advair)  Ketoconazole (Nizoral)  Loperamide (Imodium)  Magnesium (Mag-Ox)  Midodrine (Proamatine)  Multivitamin  Omeprazole (Prilosec)  Ondansetron (Zofran)  Rosuvastatin (Crestor)  Salmeterol (Advair)  Supplement  Triamcinolone (Kenalog)  Vitamin D2 (Drisdol) ==================================================================== For clinical consultation, please call [phone]. ====================================================================       ROS  Constitutional: Denies any fever or chills Gastrointestinal: No reported hemesis, hematochezia, vomiting, or acute GI  distress Musculoskeletal: Denies any acute onset joint swelling, redness, loss of ROM, or weakness Neurological: No reported episodes of acute onset apraxia, aphasia, dysarthria, agnosia, amnesia, paralysis, loss of coordination, or loss of consciousness  Medication Review  ALPRAZolam, Buprenorphine HCl, Carboxymethylcellul-Glycerin, Lubricant Eye Nighttime, Vitamin D (Ergocalciferol), abemaciclib, albuterol, amitriptyline, diphenoxylate-atropine, fludrocortisone, fluticasone-salmeterol, ketoconazole, loperamide,  methocarbamol, midodrine, multivitamin with minerals, omeprazole, ondansetron, rosuvastatin, and triamcinolone cream  History Review  Allergy: Brandy [name] is allergic to zithromax [azithromycin], other, and psyllium. Drug: Brandy [name]  reports no history of drug use. Alcohol:  reports no history of alcohol use. Tobacco:  reports that she quit smoking about 4 years ago. Her smoking use included cigarettes. She has a 6.25 pack-year smoking history. She has never used smokeless tobacco. Social: Brandy [name]  reports that she quit smoking about 4 years ago. Her smoking use included cigarettes. She has a 6.25 pack-year smoking history. She has never used smokeless tobacco. She reports that she does not drink alcohol and does not use drugs. Medical:  has a past medical history of Acute pansinusitis (08/02/2017), Anxiety, Arthritis, ASD (atrial septal defect), Cancer ([place]), Cataract, Dyspnea, GERD (gastroesophageal reflux disease), Heart murmur, History of hiatal hernia, Legally blind in right eye, as defined in [place], Lumbar herniated disc, PONV (postoperative nausea and vomiting), and Sciatica. Surgical: Brandy [name]  has a past surgical history that includes Breast surgery (Bilateral, 2011); Cataract extraction; Bunionectomy; Cleft palate repair; Cardiac surgery; Ablation; transthoracic echocardiogram; Cardiac catheterization; Lumbar laminectomy/decompression microdiscectomy (Left, 05/23/2016); Lumbar laminectomy/decompression microdiscectomy (Left, 05/23/2016); Shoulder injection (Left, 05/23/2016); Reduction mammaplasty (2011); Eye surgery (Right, 2019); Tubal ligation; and Breast lumpectomy with radioactive seed and sentinel lymph node biopsy (Right, 11/23/2020). Family: family history includes Arthritis in her mother; Cancer in her father; Diabetes in her mother; High blood pressure in her mother; Hypertension in her mother.  Laboratory Chemistry Profile    Renal Lab Results  Component Value Date   BUN 11 01/28/2022   CREATININE 1.73 (H) 01/28/2022   LABCREA 123.4 10/27/2021   BCR 12 07/28/2021   GFRAA >60 02/24/2020   GFRNONAA 34 (L) 01/28/2022    Hepatic Lab Results  Component Value Date   AST 91 (H) 01/28/2022   ALT 43 01/28/2022   ALBUMIN 3.4 (L) 01/28/2022   ALKPHOS 200 (H) 01/28/2022   LIPASE 35 07/12/2021    Electrolytes Lab Results  Component Value Date   NA 142 01/28/2022   K 3.5 01/28/2022   CL 103 01/28/2022   CALCIUM 9.0 01/28/2022   MG 1.5 (L) 06/14/2021    Bone Lab Results  Component Value Date   VD25OH 30.51 06/26/2019   VD125OH2TOT 62 05/28/2021   VW0981XB1 <8 05/28/2021   VD2125OH2 62 05/28/2021   25OHVITD1 50 11/27/2019   25OHVITD2 46 11/27/2019   25OHVITD3 4.1 11/27/2019    Inflammation (CRP: Acute Phase) (ESR: Chronic Phase) Lab Results  Component Value Date   CRP 16 (H) 11/27/2019   ESRSEDRATE 22 11/27/2019   LATICACIDVEN 2.2 ([place]) 04/21/2020         Note: Above Lab results reviewed.  Recent Imaging Review  MM DIAG BREAST TOMO BILATERAL CLINICAL DATA:  Patient has a history of metastatic right breast cancer diagnosed in October of 2020. Patient is status post right breast lumpectomy in June of 2022.  EXAM: DIGITAL DIAGNOSTIC BILATERAL MAMMOGRAM WITH TOMOSYNTHESIS  TECHNIQUE: Bilateral digital diagnostic mammography and breast tomosynthesis was performed.  COMPARISON:  Previous exam(s).  ACR Breast Density Category b: There are scattered  areas of fibroglandular density.  FINDINGS: Cc and MLO views of bilateral breasts are submitted. Postsurgical changes are identified in the right breast. The left breast is stable.  IMPRESSION: Benign findings.  RECOMMENDATION: Bilateral diagnostic mammogram in 1 year.  I have discussed the findings and recommendations with the patient. If applicable, a reminder letter will be sent to the patient regarding the next  appointment.  BI-RADS CATEGORY  2: Benign.  Electronically Signed   By: Sherian Rein M.D.   On: 01/04/2022 16:27  Cervical Imaging: Cervical MR wo contrast: Results for orders placed during the hospital encounter of 09/13/21   MR Cervical Spine w/o contrast   Narrative CLINICAL DATA:  Frequent falls. Chronic neck pain. History of breast cancer.   EXAM: MRI CERVICAL SPINE WITHOUT CONTRAST   TECHNIQUE: Multiplanar, multisequence MR imaging of the cervical spine was performed. No intravenous contrast was administered.   COMPARISON:  None.   FINDINGS: Alignment: Straightening of the normal cervical lordosis. Grade 1 retrolisthesis of C3 on C4 and grade 1 anterolisthesis of C4 on C5.   Vertebrae: No fracture, suspicious marrow lesion, or significant marrow edema.   Cord: Normal signal and morphology.   Posterior Fossa, vertebral arteries, paraspinal tissues: Posterior fossa more fully evaluated on the concurrent head MRI. Preserved vertebral artery flow voids.   Disc levels:   C2-3: Right uncovertebral spurring and severe right facet arthrosis result in mild right neural foraminal stenosis without spinal stenosis.   C3-4: Broad-based posterior disc osteophyte complex and mild right and severe left facet arthrosis result in mild spinal stenosis and mild left neural foraminal stenosis. Left facet ankylosis.   C4-5: Broad-based posterior disc osteophyte complex and severe right greater than left facet arthrosis result in mild spinal stenosis and moderate right neural foraminal stenosis.   C5-6: Broad-based posterior disc osteophyte complex and severe right and moderate left facet arthrosis result in mild right neural foraminal stenosis without spinal stenosis. Right facet ankylosis.   C6-7: Disc bulging, uncovertebral spurring, and severe facet arthrosis result in mild-to-moderate right and mild left neural foraminal stenosis without spinal stenosis.   C7-T1:  Moderate right and mild left facet arthrosis without stenosis.   IMPRESSION: 1. Diffuse cervical disc degeneration and advanced facet arthrosis with mild spinal stenosis at C3-4 and C4-5. 2. Moderate right neural foraminal stenosis at C4-5. 3. No evidence of metastatic disease in the cervical spine.     Electronically Signed By: Sebastian Ache M.D. On: 09/14/2021 08:16   MR Shoulder Left w/o contrast   Narrative CLINICAL DATA:  Shoulder pain for 6 months. No known injury or prior relevant surgery.   EXAM: MRI OF THE LEFT SHOULDER WITHOUT CONTRAST   TECHNIQUE: Multiplanar, multisequence MR imaging of the shoulder was performed. No intravenous contrast was administered.   COMPARISON:  Radiographs 07/18/2016.   FINDINGS: Rotator cuff:  Intact without significant tendinosis.   Muscles: There is some T2 hyperintensity within the deltoid muscle posterolateral to the acromion. The patient denies shoulder injection, and this may reflect a muscular strain. There is no abnormal signal within the rotator cuff musculature.   Biceps long head:  Intact and normally positioned.   Acromioclavicular Joint: The acromion is type 1. There are mild acromioclavicular degenerative changes. There is a small amount of fluid in the subacromial -subdeltoid bursa.   Glenohumeral Joint: No significant shoulder joint effusion or glenohumeral arthropathy.   Labrum:  No evidence of labral tear.   Bones: No acute or significant extra-articular osseous findings.  Other: No significant soft tissue findings.   IMPRESSION: 1. Probable posterior deltoid muscle strain with adjacent subacromial -subdeltoid bursitis. 2. The rotator cuff, labrum and biceps tendon appear normal.     Electronically Signed By: Richardean Sale M.D. On: 07/26/2016 16:23   Thoracic Imaging: Thoracic MR wo contrast: Results for orders placed during the hospital encounter of 09/13/21   MR Thoracic Spine w/o contrast    Narrative CLINICAL DATA:  Frequent falls.  History of breast cancer.   EXAM: MRI THORACIC SPINE WITHOUT CONTRAST   TECHNIQUE: Multiplanar, multisequence MR imaging of the thoracic spine was performed. No intravenous contrast was administered.   COMPARISON:  Chest CTA 07/12/2021   FINDINGS: Alignment:  Normal.   Vertebrae: No fracture, suspicious marrow lesion, or significant marrow edema.   Cord:  Normal signal.   Paraspinal and other soft tissues: Unremarkable.   Disc levels:   Minimal disc bulging and endplate spurring throughout much of the thoracic spine as well as a small left central disc protrusion at T10-11. No spinal stenosis or significant spinal cord mass effect. Moderate facet arthrosis in the upper and lower thoracic spine resulting in mild neural foraminal stenosis, for example at T2-3, T3-4, and T10-11.   IMPRESSION: 1. No evidence of metastatic disease in the thoracic spine. 2. Mild disc and moderate facet degeneration without spinal stenosis or high-grade neural foraminal stenosis.     Electronically Signed By: Logan Bores M.D. On: 09/14/2021 08:02 Narrative Clinical Data: Increased activity on bone scan to the left in the region of T9-10, back pain   THORACOLUMBAR SPINE - 2 VIEW   Comparison: Nuclear medicine bone scan of today   Findings: Only mild degenerative changes noted which may account for the faint increased activity in the lower thoracic spine in the region of T9-10 on the left.  The no lytic or blastic lesion is seen and the pedicles appear normal.   IMPRESSION: The small focus of activity to the left at T9-10 on today's bone scan is most likely due to mild degenerative change.  No other bony abnormality is seen.   Provider: Pearla Dubonnet       MR Lumbar Spine w/o contrast   Narrative CLINICAL DATA:  Spinal stenosis, lumbar. Low back pain radiating to bilateral sides/legs with numbness and weakness in bilateral legs for  years. History of lumbar spine laminectomy in 2017.   EXAM: MRI LUMBAR SPINE WITHOUT CONTRAST   TECHNIQUE: Multiplanar, multisequence MR imaging of the lumbar spine was performed. No intravenous contrast was administered.   COMPARISON:  MR lumbar 01/13/2017; X-ray lumbar 08/26/2021.   FINDINGS: Segmentation:  5 lumbar vertebra   Alignment: 4 mm anterolisthesis L2-3 is unchanged. 2 mm anterolisthesis L3-4 unchanged. 4 mm retrolisthesis L4-5 and L5-S1 also unchanged.   Vertebrae: Negative for fracture or mass. Normal appearing bone marrow.   Conus medullaris and cauda equina: Conus extends to the L2-3 level. Conus and cauda equina appear normal.   Paraspinal and other soft tissues: Negative for paraspinous mass, adenopathy, fluid collection   Disc levels:   L1-2: Minimal disc degeneration. Moderate to advanced facet degeneration. Interval development of posterior epidural synovial cyst measuring 7 x 8 mm. This flattens the posterior thecal sac but is not causing significant spinal stenosis. Generous size spinal canal.   L2-3: Anterolisthesis. Moderate to advanced facet degeneration bilaterally. No significant stenosis   L3-4: Mild anterolisthesis. Shallow right foraminal disc protrusion unchanged from the prior study. Mild disc degeneration and moderate facet degeneration. Negative  for stenosis.   L4-5: Left laminotomy. Diffuse bulging of the disc and mild facet degeneration. Mild left subarticular stenosis, unchanged. Spinal canal adequate in size   L5-S1: Left laminotomy. Retrolisthesis with disc degeneration and spurring. Bilateral facet degeneration. Mild subarticular stenosis bilaterally. No interval change.   IMPRESSION: Postop laminectomy left L4-5 and L5-S1. No recurrent disc protrusion. Subarticular stenosis left L4-5 unchanged. Subarticular stenosis bilaterally L5-S1 unchanged   Multilevel facet degeneration. Interval development of 7 x 8 mm synovial  cyst at L1-2 projecting into the posterior epidural space in the midline. This is not causing significant spinal stenosis.     Electronically Signed By: Franchot Gallo M.D. On: 09/29/2021 10:49   MR Lumbar Spine W Wo Contrast   Narrative CLINICAL DATA:  Intermittent back pain for 5 years, bilateral lower extremity burning and numbness. Follow-up radiculopathy.   EXAM: MRI LUMBAR SPINE WITHOUT AND WITH CONTRAST   TECHNIQUE: Multiplanar and multiecho pulse sequences of the lumbar spine were obtained without and with intravenous contrast.   CONTRAST:  15mL MULTIHANCE GADOBENATE DIMEGLUMINE 529 MG/ML IV SOLN   COMPARISON:  MRI of the lumbar spine March 06, 2016 and lumbar spine radiographs August 25, 2016   FINDINGS: SEGMENTATION: For the purposes of this report, the last well-formed intervertebral disc will be reported as L5-S1.   ALIGNMENT: Straightened lumbar lordosis. Minimal grade 1 L2-3 anterolisthesis. Minimal grade 1 L4-5 retrolisthesis. Grade 1 L5-S1 retrolisthesis.   VERTEBRAE:Vertebral bodies are intact. Moderate to severe L5-S1 disc height loss, progressed from prior MRI. Mild desiccation lower lumbar discs. Mild chronic discogenic endplate changes J8-A4. Similar low T1, bright STIR signal single segment upper coccyx, without definite enhancement though, not tailored for evaluation. No abnormal or acute lumbar spine bone marrow signal or enhancement. No abnormal disc enhancement.   CONUS MEDULLARIS: Conus medullaris terminates at L1-2 and demonstrates normal morphology and signal characteristics. Cauda equina is normal. No abnormal cord, leptomeningeal or epidural enhancement.   PARASPINAL AND SOFT TISSUES: Included prevertebral and paraspinal soft tissues are nonacute. Moderate LEFT sacral paraspinal denervation/atrophy.   DISC LEVELS:   T12-L1, L1-2: No disc bulge, canal stenosis nor neural foraminal narrowing. Mild to moderate facet arthropathy.    L2-3: Anterolisthesis. Unroofing of the disc without disc bulge. 4 mm RIGHT facet synovial cyst decreased from 9 mm, decreased fluid component. Moderate to severe RIGHT, moderate LEFT facet arthropathy and ligamentum flavum redundancy without canal stenosis or neural foraminal narrowing.   L3-4: Small broad-based RIGHT extraforaminal disc protrusion unchanged. Moderate to severe facet arthropathy without canal stenosis or neural foraminal narrowing.   L4-5: Retrolisthesis. LEFT hemilaminectomy. Homogeneous enhancing granulation tissue within the surgical bed, no fluid collection. Small LEFT subarticular disc protrusion and annular fissure persists. No canal stenosis. Mild LEFT neural foraminal narrowing.   L5-S1: Bilateral L5-S1 laminectomies. Homogeneous enhancing granulation tissue within the surgical bed, no focal fluid collection. Mild facet arthropathy. No canal stenosis. Mild LEFT neural foraminal narrowing.   IMPRESSION: 1. Status post LEFT L4-5 and bilateral L5-S1 laminectomies. 2. Grade 1 L2-3 anterolisthesis, grade 1 L4-5 retrolisthesis, grade 1 L5-S1 retrolisthesis. 3. No canal stenosis. Mild LEFT L4-5 and L5-S1 neural foraminal narrowing. 4. Similar abnormal upper coccyx bone marrow signal, nonspecific and incompletely characterized.     Electronically Signed By: Elon Alas M.D. On: 01/14/2017 00:23   DG Lumbar Spine 1 View   Narrative CLINICAL DATA:  Left L4-5 decompression.   EXAM: LUMBAR SPINE - 1 VIEW   COMPARISON:  05/23/2016 intraoperative lumbar spine radiographs .  FINDINGS: There is a posterior approach surgical marking device with the tip overlying the spinal canal at the level of the L5-S1 disc. Additional surgical instruments overlie the posterior lower back soft tissues at the L4-S1 levels.   IMPRESSION: Posterior approach surgical marking device position as described.     Electronically Signed By: Ilona Sorrel M.D. On:  05/23/2016 10:57   Lumbar DG 1V (Clearing): No results found for this or any previous visit.   Lumbar DG 2-3V (Clearing): No results found for this or any previous visit.   Lumbar DG 2-3 views: Results for orders placed during the hospital encounter of 05/23/16   DG Lumbar Spine 2-3 Views   Narrative CLINICAL DATA:  Intraoperative localization for L5-S1 decompression.   EXAM: LUMBAR SPINE - 2-3 VIEW   COMPARISON:  Lumbar spine MRI on 03/06/2016   FINDINGS: Intraoperative crosstable lateral 1 shows a probe overlying the posterior interspinous space at L4-5.   Intraoperative cross-table lateral 2 shows posterior retractors with a probe overlying the spinal canal at the level L4-5.   IMPRESSION: Intraoperative localization of L4-5.   These results were called by telephone at the time of interpretation on 05/23/2016 at 10:38 am to the [facility], who verbally acknowledged these results and confirmed that Dr. Louanne Skye was aware these results.     Electronically Signed By: Earle Gell M.D. On: 05/23/2016 10:39   Lumbar DG (Complete) 4+V: Results for orders placed in visit on 10/01/12   DG Lumbar Spine Complete   Narrative *RADIOLOGY REPORT*   Clinical Data: 56 year old female with low back pain.   LUMBAR SPINE - COMPLETE 4+ VIEW   Comparison: Nuclear medicine whole body bone scan 12/05/2007. Thoracic radiographs 12/05/2007.   Findings: Normal lumbar segmentation.  Mild retrolisthesis of L5 on S1.  Mild to moderate disc space loss at that level and endplate spurring.  Trace retrolisthesis of L4 on L5.  Otherwise normal lumbar vertebral height and alignment.  The other disc spaces are relatively preserved.  No pars fracture.  Grossly intact lower thoracic levels.  Sacral ala and SI joints within normal limits.   IMPRESSION: Chronic disc and endplate degeneration at L5-S1 with mild retrolisthesis. No acute osseous abnormality in the lumbar spine.     Original Report  Authenticated By: Roselyn Reef, M.D.     Narrative CLINICAL DATA:  Chronic low back pain.  No reported injury.   EXAM: LUMBAR SPINE - COMPLETE WITH BENDING VIEWS   COMPARISON:  08/25/2016 lumbar spine radiographs.   FINDINGS: This report assumes 5 non rib-bearing lumbar vertebrae.   Lumbar vertebral body heights are preserved, with no fracture.   Mild-to-moderate multilevel lumbar degenerative disc disease, most prominent at L5-S1. There is 3 mm anterolisthesis at L2-3 on the neutral view, which measures 3 mm with flexion and extension. There is 3 mm anterolisthesis at L3-4 on the neutral view, which measures 4 mm with flexion and 2 mm with extension. There is 3 mm retrolisthesis at L4-5, which measures 3 mm with flexion and extension. Mild bilateral lumbar facet arthropathy, most prominent in the mid lumbar spine. No aggressive appearing focal osseous lesions.   IMPRESSION: 1. Mild-to-moderate multilevel lumbar degenerative disc disease, most prominent at L5-S1. 2. Mild multilevel lumbar facet arthropathy. 3. Mild multilevel lumbar spondylolisthesis as detailed, with slight dynamic instability at L3-4.     Electronically Signed By: Ilona Sorrel M.D. On: 11/28/2019 16:50   Sacroiliac Joint Imaging: Sacroiliac Joint DG: Results for orders placed during the hospital  encounter of 11/27/19   DG Si Joints   Narrative CLINICAL DATA:  Chronic low back and bilateral hip pain. No reported injury.   EXAM: BILATERAL SACROILIAC JOINTS - 3+ VIEW   COMPARISON:  None.   FINDINGS: The sacroiliac joint spaces are maintained and there is no evidence of arthropathy. Mild spondylosis noted in the visualized lower lumbar spine. No suspicious focal osseous lesions.   IMPRESSION: No evidence of sacroiliac arthropathy. Mild spondylosis in the visualized lower lumbar spine.     Electronically Signed By: Ilona Sorrel M.D. On: 11/28/2019 16:26   Narrative CLINICAL DATA:   Chronic low back and right hip pain. No reported injury.   EXAM: DG HIP (WITH OR WITHOUT PELVIS) 2-3V RIGHT   COMPARISON:  None.   FINDINGS: No pelvic fracture or diastasis. No right hip fracture or dislocation. No suspicious focal osseous lesions. Tiny marginal osteophytes at the inferior right humeral head and superolateral acetabular margin. Mild degenerative changes in the visualized lower lumbar spine.   IMPRESSION: Minimal right hip osteoarthritis.     Electronically Signed By: Ilona Sorrel M.D. On: 11/28/2019 16:23     Narrative CLINICAL DATA:  Chronic low back and bilateral hip pain. No reported injury.   EXAM: DG HIP (WITH OR WITHOUT PELVIS) 2-3V LEFT   COMPARISON:  None.   FINDINGS: No left hip fracture or dislocation. No suspicious focal osseous lesions. No significant left hip arthropathy. No radiopaque foreign bodies.   IMPRESSION: No significant left hip arthropathy.  No acute osseous abnormality.     Electronically Signed By: Ilona Sorrel M.D. On: 11/28/2019 16:24 DG Knee Complete 4 Views Left   Narrative CLINICAL DATA:  Pain in left knee 6 days after fall; pt states that she fell due to ice on the ground, fell onto her left knee and felt the knee popped; no previous injury; pain on both lateral and medial sides of the knee, pain radiates to hip and ankle sometime but not much per pt   EXAM: LEFT KNEE - COMPLETE 4+ VIEW   COMPARISON:  None.   FINDINGS: No fracture or dislocation.   There are no significant arthropathic changes.   No joint effusion.  Soft tissues are unremarkable.   IMPRESSION: Negative.     Electronically Signed By: Lajean Manes M.D. On: 07/06/2014 10:53     Complexity Note: Imaging results reviewed.                Note: Reviewed        Physical Exam  General appearance: Well nourished, well developed, and well hydrated. In no apparent acute distress Mental status: Alert, oriented x 3 (person, place, &  time)       Respiratory: No evidence of acute respiratory distress Eyes: PERLA Vitals: BP 133/67   Pulse (!) 103   Temp (!) 97.2 F (36.2 C)   Ht 5' 7" (1.702 m)   Wt 213 lb (96.6 kg)   SpO2 98%   BMI 33.36 kg/m  BMI: Estimated body mass index is 33.36 kg/m as calculated from the following:   Height as of this encounter: 5' 7" (1.702 m).   Weight as of this encounter: 213 lb (96.6 kg). Ideal: Ideal body weight: 61.6 kg (135 lb 12.9 oz) Adjusted ideal body weight: 75.6 kg (166 lb 10.9 oz)  Lumbar Spine Area Exam  Skin & Axial Inspection: Well healed scar from previous spine surgery detected Alignment: Symmetrical Functional ROM: Pain restricted ROM  Stability: No instability detected Muscle Tone/Strength: Functionally intact. No obvious neuro-muscular anomalies detected. Sensory (Neurological): Dermatomal pain pattern and facet mediated Palpation: No palpable anomalies         Positive pain with lumbar extension and facet loading.   Ambulation: Unassisted Gait: Antalgic Posture: Difficulty standing up straight, due to pain  Lower Extremity Exam      Side: Right lower extremity   Side: Left lower extremity  Stability: No instability observed           Stability: No instability observed          Skin & Extremity Inspection: Skin color, temperature, and hair growth are WNL. No peripheral edema or cyanosis. No masses, redness, swelling, asymmetry, or associated skin lesions. No contractures.   Skin & Extremity Inspection: Skin color, temperature, and hair growth are WNL. No peripheral edema or cyanosis. No masses, redness, swelling, asymmetry, or associated skin lesions. No contractures.  Functional ROM: Pain restricted ROM for hip and knee joints           Functional ROM: Pain restricted ROM for hip and knee joints          Muscle Tone/Strength: Functionally intact. No obvious neuro-muscular anomalies detected.   Muscle Tone/Strength: Functionally intact. No obvious  neuro-muscular anomalies detected.  Sensory (Neurological): Unimpaired         Sensory (Neurological): Unimpaired        DTR: Patellar: deferred today Achilles: deferred today Plantar: deferred today   DTR: Patellar: deferred today Achilles: deferred today Plantar: deferred today  Palpation: No palpable anomalies   Palpation: No palpable anomalies     Assessment   Diagnosis Status  1. Lumbar facet hypertrophy (Multilevel) (Bilateral)   2. Post laminectomy syndrome   3. Lumbar facet syndrome (Multilevel) (Bilateral) (R>L)   4. Grade 1 Retrolisthesis (L4-5, L5-S1)   5. Grade 1 Anterolisthesis (L2-3, L3-4)   6. Chronic low back pain (Bilateral) w/o sciatica   7. Chronic pain syndrome    Controlled Controlled Controlled   Updated Problems: Problem  Spondylosis Without Myelopathy Or Radiculopathy, Lumbosacral Region  Chronic Pain Syndrome  Ddd (Degenerative Disc Disease), Lumbosacral  Grade 1 Retrolisthesis (L4-5, L5-S1)   FINDINGS: Alignment: Mild retrolisthesis at L4-5 and L5-S1.   Lumbar facet syndrome (Multilevel) (Bilateral) (R>L)  Neurogenic Pain  Ckd (Chronic Kidney Disease), Stage III (Hcc)  Depression  Spinal Stenosis, Lumbar Region With Neurogenic Claudication   Bilateral left greater than right L4-5, synovial cyst right L2-3 not symptomatic.   Post Laminectomy Syndrome     Plan of Care  Problem-specific:  No problem-specific Assessment & Plan notes found for this encounter.  Ms. Rhaelyn Giron has a current medication list which includes the following long-term medication(s): albuterol, amitriptyline, fluticasone-salmeterol, omeprazole, and rosuvastatin.  Pharmacotherapy (Medications Ordered): Meds ordered this encounter  Medications   Buprenorphine HCl (BELBUCA) 150 MCG FILM    Sig: Place 1 Film inside cheek every 12 (twelve) hours.    Dispense:  60 Film    Refill:  2    Chronic Pain: STOP Act (Not applicable) Fill 1 day early if closed  on refill date. Avoid benzodiazepines within 8 hours of opioids   Reduce dose of belbuca 150 mcg twice daily. We will also look into scheduling lumbar facet medial branch nerve blocks.  See initial clinic note. Patient would like to repeat her bilateral L2, L3, L4, L5 facet medial branch nerve blocks that were done 03/02/2020 that provided her with 100% pain relief for  approximately 6 months until she sustained a fall.  We also discussed lumbar radiofrequency ablation if she does have a positive response to her medial branch nerve block which she did in the past.  I also discussed spinal cord stimulation with her as she does have a history of postlaminectomy pain syndrome given history of prior left L4-L5 and L5-S1 laminotomies.   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   Follow-up plan:   Return in about 3 months (around 06/08/2022) for Medication Management, in person.    Recent Visits Date Type Provider Dept  01/11/22 Office Visit Gillis Santa, MD [facility]  Showing recent visits within past 90 days and meeting all other requirements Today's Visits Date Type Provider Dept  03/08/22 Office Visit Gillis Santa, MD [facility]  Showing today's visits and meeting all other requirements Future Appointments Date Type Provider Dept  06/02/22 Appointment Gillis Santa, MD [facility]  Showing future appointments within next 90 days and meeting all other requirements  I discussed the assessment and treatment plan with the patient. The patient was provided an opportunity to ask questions and all were answered. The patient agreed with the plan and demonstrated an understanding of the instructions.  Patient advised to call back or seek an in-person evaluation if the symptoms or condition worsens.  Duration of encounter: 71mnutes.  Total time on encounter, as per AMA guidelines  included both the face-to-face and non-face-to-face time personally spent by the physician and/or other qualified health care professional(s) on the day of the encounter (includes time in activities that require the physician or other qualified health care professional and does not include time in activities normally performed by clinical staff). Physician's time may include the following activities when performed: preparing to see the patient (eg, review of tests, pre-charting review of records) obtaining and/or reviewing separately obtained history performing a medically appropriate examination and/or evaluation counseling and educating the patient/family/caregiver ordering medications, tests, or procedures referring and communicating with other health care professionals (when not separately reported) documenting clinical information in the electronic or other health record independently interpreting results (not separately reported) and communicating results to the patient/ family/caregiver care coordination (not separately reported)  Note by: BGillis Santa MD Date: 03/08/2022; Time: 10:49 AM

## 2022-03-09 ENCOUNTER — Inpatient Hospital Stay (HOSPITAL_BASED_OUTPATIENT_CLINIC_OR_DEPARTMENT_OTHER): Payer: Medicare Other | Admitting: Hematology

## 2022-03-09 ENCOUNTER — Telehealth: Payer: Self-pay | Admitting: Student in an Organized Health Care Education/Training Program

## 2022-03-09 ENCOUNTER — Inpatient Hospital Stay: Payer: Medicare Other

## 2022-03-09 ENCOUNTER — Inpatient Hospital Stay: Payer: Medicare Other | Attending: Hematology

## 2022-03-09 VITALS — BP 147/71 | HR 88 | Temp 97.9°F | Resp 18 | Ht 67.0 in | Wt 208.7 lb

## 2022-03-09 DIAGNOSIS — K760 Fatty (change of) liver, not elsewhere classified: Secondary | ICD-10-CM | POA: Diagnosis not present

## 2022-03-09 DIAGNOSIS — Z801 Family history of malignant neoplasm of trachea, bronchus and lung: Secondary | ICD-10-CM | POA: Diagnosis not present

## 2022-03-09 DIAGNOSIS — C50919 Malignant neoplasm of unspecified site of unspecified female breast: Secondary | ICD-10-CM | POA: Diagnosis not present

## 2022-03-09 DIAGNOSIS — R7989 Other specified abnormal findings of blood chemistry: Secondary | ICD-10-CM | POA: Insufficient documentation

## 2022-03-09 DIAGNOSIS — C7801 Secondary malignant neoplasm of right lung: Secondary | ICD-10-CM | POA: Diagnosis not present

## 2022-03-09 DIAGNOSIS — I700 Atherosclerosis of aorta: Secondary | ICD-10-CM | POA: Diagnosis not present

## 2022-03-09 DIAGNOSIS — Z833 Family history of diabetes mellitus: Secondary | ICD-10-CM | POA: Insufficient documentation

## 2022-03-09 DIAGNOSIS — C50411 Malignant neoplasm of upper-outer quadrant of right female breast: Secondary | ICD-10-CM | POA: Diagnosis not present

## 2022-03-09 DIAGNOSIS — M4802 Spinal stenosis, cervical region: Secondary | ICD-10-CM | POA: Diagnosis not present

## 2022-03-09 DIAGNOSIS — Z170 Estrogen receptor positive status [ER+]: Secondary | ICD-10-CM | POA: Diagnosis not present

## 2022-03-09 DIAGNOSIS — Z8249 Family history of ischemic heart disease and other diseases of the circulatory system: Secondary | ICD-10-CM | POA: Insufficient documentation

## 2022-03-09 DIAGNOSIS — Z5111 Encounter for antineoplastic chemotherapy: Secondary | ICD-10-CM | POA: Diagnosis not present

## 2022-03-09 DIAGNOSIS — K219 Gastro-esophageal reflux disease without esophagitis: Secondary | ICD-10-CM | POA: Diagnosis not present

## 2022-03-09 DIAGNOSIS — E876 Hypokalemia: Secondary | ICD-10-CM | POA: Insufficient documentation

## 2022-03-09 DIAGNOSIS — Z87891 Personal history of nicotine dependence: Secondary | ICD-10-CM | POA: Diagnosis not present

## 2022-03-09 DIAGNOSIS — Z8261 Family history of arthritis: Secondary | ICD-10-CM | POA: Diagnosis not present

## 2022-03-09 DIAGNOSIS — C7802 Secondary malignant neoplasm of left lung: Secondary | ICD-10-CM | POA: Diagnosis not present

## 2022-03-09 DIAGNOSIS — M5031 Other cervical disc degeneration,  high cervical region: Secondary | ICD-10-CM | POA: Insufficient documentation

## 2022-03-09 DIAGNOSIS — C50511 Malignant neoplasm of lower-outer quadrant of right female breast: Secondary | ICD-10-CM | POA: Diagnosis present

## 2022-03-09 LAB — CBC WITH DIFFERENTIAL (CANCER CENTER ONLY)
Abs Immature Granulocytes: 0.01 10*3/uL (ref 0.00–0.07)
Basophils Absolute: 0 10*3/uL (ref 0.0–0.1)
Basophils Relative: 1 %
Eosinophils Absolute: 0.1 10*3/uL (ref 0.0–0.5)
Eosinophils Relative: 1 %
HCT: 32.9 % — ABNORMAL LOW (ref 36.0–46.0)
Hemoglobin: 11.4 g/dL — ABNORMAL LOW (ref 12.0–15.0)
Immature Granulocytes: 0 %
Lymphocytes Relative: 54 %
Lymphs Abs: 2.5 10*3/uL (ref 0.7–4.0)
MCH: 36 pg — ABNORMAL HIGH (ref 26.0–34.0)
MCHC: 34.7 g/dL (ref 30.0–36.0)
MCV: 103.8 fL — ABNORMAL HIGH (ref 80.0–100.0)
Monocytes Absolute: 0.2 10*3/uL (ref 0.1–1.0)
Monocytes Relative: 5 %
Neutro Abs: 1.8 10*3/uL (ref 1.7–7.7)
Neutrophils Relative %: 39 %
Platelet Count: 201 10*3/uL (ref 150–400)
RBC: 3.17 MIL/uL — ABNORMAL LOW (ref 3.87–5.11)
RDW: 14.5 % (ref 11.5–15.5)
WBC Count: 4.6 10*3/uL (ref 4.0–10.5)
nRBC: 0 % (ref 0.0–0.2)

## 2022-03-09 LAB — CMP (CANCER CENTER ONLY)
ALT: 37 U/L (ref 0–44)
AST: 50 U/L — ABNORMAL HIGH (ref 15–41)
Albumin: 3.6 g/dL (ref 3.5–5.0)
Alkaline Phosphatase: 195 U/L — ABNORMAL HIGH (ref 38–126)
Anion gap: 8 (ref 5–15)
BUN: 11 mg/dL (ref 6–20)
CO2: 35 mmol/L — ABNORMAL HIGH (ref 22–32)
Calcium: 8.8 mg/dL — ABNORMAL LOW (ref 8.9–10.3)
Chloride: 100 mmol/L (ref 98–111)
Creatinine: 1.44 mg/dL — ABNORMAL HIGH (ref 0.44–1.00)
GFR, Estimated: 42 mL/min — ABNORMAL LOW (ref >60)
Glucose, Bld: 125 mg/dL — ABNORMAL HIGH (ref 70–99)
Potassium: 2.5 mmol/L — CL (ref 3.5–5.1)
Sodium: 143 mmol/L (ref 135–145)
Total Bilirubin: 0.8 mg/dL (ref 0.3–1.2)
Total Protein: 6.5 g/dL (ref 6.5–8.1)

## 2022-03-09 MED ORDER — FULVESTRANT 250 MG/5ML IM SOSY
500.0000 mg | PREFILLED_SYRINGE | Freq: Once | INTRAMUSCULAR | Status: AC
  Administered 2022-03-09: 500 mg via INTRAMUSCULAR
  Filled 2022-03-09: qty 10

## 2022-03-09 NOTE — Telephone Encounter (Signed)
PT is returning a call back , patient believes the call is about the pre autho for her medications. Please give patient a call. Thanks

## 2022-03-09 NOTE — Telephone Encounter (Signed)
Returned patient's call. No answer and no way to leave voicemail due to mailbox full.Patient's Brandy [name] was denied but Brandy [name] is applyiing with documentation per [employer]. Please see PA book for details.
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
## 2022-03-11 ENCOUNTER — Encounter: Payer: Self-pay | Admitting: Student in an Organized Health Care Education/Training Program

## 2022-03-11 ENCOUNTER — Encounter: Payer: Self-pay | Admitting: Hematology

## 2022-03-11 ENCOUNTER — Telehealth: Payer: Self-pay | Admitting: Student in an Organized Health Care Education/Training Program

## 2022-03-11 NOTE — Telephone Encounter (Signed)
I called patient's insurance, Brandy [name] has been denied. They will fax to our office information on how to appeal. Patient informed.

## 2022-03-11 NOTE — Telephone Encounter (Signed)
PT stated that she hasn't been able to pick up prescription ,cause the pharmacy  need a autho. PT asked to give her a call when the autho had been call in. Thanks

## 2022-03-11 NOTE — Progress Notes (Unsigned)
Safety precautions to be maintained throughout the outpatient stay will include: orient to surroundings, keep bed in low position, maintain call bell within reach at all times, provide assistance with transfer out of bed and ambulation.  

## 2022-03-12 DIAGNOSIS — G4733 Obstructive sleep apnea (adult) (pediatric): Secondary | ICD-10-CM | POA: Diagnosis not present

## 2022-03-14 ENCOUNTER — Encounter: Payer: Self-pay | Admitting: Student in an Organized Health Care Education/Training Program

## 2022-03-14 ENCOUNTER — Ambulatory Visit
Payer: Medicare Other | Attending: Student in an Organized Health Care Education/Training Program | Admitting: Student in an Organized Health Care Education/Training Program

## 2022-03-14 ENCOUNTER — Ambulatory Visit
Admission: RE | Admit: 2022-03-14 | Discharge: 2022-03-14 | Disposition: A | Discharge status: Home / Self Care | Payer: Medicare Other | Source: Ambulatory Visit | Attending: Student in an Organized Health Care Education/Training Program | Admitting: Student in an Organized Health Care Education/Training Program

## 2022-03-14 DIAGNOSIS — M5137 Other intervertebral disc degeneration, lumbosacral region: Secondary | ICD-10-CM | POA: Insufficient documentation

## 2022-03-14 DIAGNOSIS — M47816 Spondylosis without myelopathy or radiculopathy, lumbar region: Secondary | ICD-10-CM | POA: Diagnosis not present

## 2022-03-14 DIAGNOSIS — M47817 Spondylosis without myelopathy or radiculopathy, lumbosacral region: Secondary | ICD-10-CM | POA: Insufficient documentation

## 2022-03-14 DIAGNOSIS — M4317 Spondylolisthesis, lumbosacral region: Secondary | ICD-10-CM | POA: Insufficient documentation

## 2022-03-14 DIAGNOSIS — G8929 Other chronic pain: Secondary | ICD-10-CM | POA: Insufficient documentation

## 2022-03-14 DIAGNOSIS — M4316 Spondylolisthesis, lumbar region: Secondary | ICD-10-CM | POA: Diagnosis not present

## 2022-03-14 MED ORDER — LIDOCAINE HCL 2 % IJ SOLN
20.0000 mL | Freq: Once | INTRAMUSCULAR | Status: AC
  Administered 2022-03-14: 400 mg

## 2022-03-14 MED ORDER — DEXAMETHASONE SODIUM PHOSPHATE 10 MG/ML IJ SOLN
10.0000 mg | Freq: Once | INTRAMUSCULAR | Status: AC
  Administered 2022-03-14: 10 mg

## 2022-03-14 MED ORDER — ROPIVACAINE HCL 2 MG/ML IJ SOLN
9.0000 mL | Freq: Once | INTRAMUSCULAR | Status: AC
  Administered 2022-03-14: 9 mL via PERINEURAL

## 2022-03-14 NOTE — Patient Instructions (Signed)

## 2022-03-14 NOTE — Progress Notes (Signed)
PROVIDER NOTE: Information contained herein reflects review and annotations entered in association with encounter. Interpretation of such information and data should be left to medically-trained personnel. Information provided to patient can be located elsewhere in the medical record under "Patient Instructions". Document created using STT-dictation technology, any transcriptional errors that may result from process are unintentional.    Patient: Brandy [name]  Service Category: Procedure  Provider: Gillis Santa, MD  DOB: 1966/05/09  DOS: 03/14/2022  Location: [facility]  MRN: [id number]  Setting: Ambulatory - outpatient  Referring Provider: Gillis Santa, MD  Type: Established Patient  Specialty: Interventional Pain Management  PCP: Sandrea Hughs, NP   Primary Reason for Visit: Interventional Pain Management Treatment. CC: Back Pain (Lumbar bilateral )  Procedure:          Anesthesia, Analgesia, Anxiolysis:  Type: Lumbar Facet, Medial Branch Block(s) #2  Primary Purpose: Therapeutic Region: Posterolateral Lumbosacral Spine Level: L2, L3, L4, L5, Medial Branch Level(s). Injecting these levels blocks the L2-3, L3-4, L4-5, lumbar facet joints. Laterality: Bilateral  Type: Local Anesthesia Sedation: Meaningful verbal contact was maintained at all times during the procedure  Local Anesthetic: Lidocaine 1-2%  Position: Prone   Indications: 1. Spondylosis without myelopathy or radiculopathy, lumbosacral region   2. Lumbar facet syndrome (Multilevel) (Bilateral) (R>L)   3. Lumbar facet hypertrophy (Multilevel) (Bilateral)   4. Grade 1 Anterolisthesis (L2-3, L3-4)   5. Grade 1 Retrolisthesis (L4-5, L5-S1)   6. DDD (degenerative disc disease), lumbosacral   7. Chronic low back pain (Bilateral) w/o sciatica    Pain Score: Pre-procedure: 3 /10 Post-procedure: 0-No pain (moving/walking)/10   Pre-op Assessment:  Brandy [name] is a 56 y.o. (year old),  female patient, seen today for interventional treatment. She  has a past surgical history that includes Breast surgery (Bilateral, 2011); Cataract extraction; Bunionectomy; Cleft palate repair; Cardiac surgery; Ablation; transthoracic echocardiogram; Cardiac catheterization; Lumbar laminectomy/decompression microdiscectomy (Left, 05/23/2016); Lumbar laminectomy/decompression microdiscectomy (Left, 05/23/2016); Shoulder injection (Left, 05/23/2016); Reduction mammaplasty (2011); Eye surgery (Right, 2019); Tubal ligation; and Breast lumpectomy with radioactive seed and sentinel lymph node biopsy (Right, 11/23/2020). Brandy [name] has a current medication list which includes the following prescription(s): albuterol, alprazolam, amitriptyline, belbuca, carboxymethylcellul-glycerin, diphenoxylate-atropine, fludrocortisone, fluticasone-salmeterol, ketoconazole, ketorolac, loperamide, methocarbamol, midodrine, multivitamin with minerals, omeprazole, ondansetron, rosuvastatin, triamcinolone cream, verzenio, vitamin d (ergocalciferol), lubricant eye nighttime, and xiidra. Her primarily concern today is the Back Pain (Lumbar bilateral )  Initial Vital Signs:  Pulse/HCG Rate: (!) 106  Temp: (!) 97.2 F (36.2 C) Resp: 16 BP: (!) 118/58 SpO2: 97 %  BMI: Estimated body mass index is 32.58 kg/m as calculated from the following:   Height as of this encounter: '5\' 7"'$  (1.702 m).   Weight as of this encounter: 208 lb (94.3 kg).  Risk Assessment: Allergies: Reviewed. She is allergic to zithromax [azithromycin], other, and psyllium.  Allergy Precautions: None required Coagulopathies: Reviewed. None identified.  Blood-thinner therapy: None at this time Active Infection(s): Reviewed. None identified. Brandy [name] is afebrile  Site Confirmation: Brandy [name] was asked to confirm the procedure and laterality before marking the site Procedure checklist: Completed Consent: Before the procedure and  under the influence of no sedative(s), amnesic(s), or anxiolytics, the patient was informed of the treatment options, risks and possible complications. To fulfill our ethical and legal obligations, as recommended by the American Medical Association's Code of Ethics, I have informed the patient of my clinical impression; the nature and purpose of the treatment or procedure; the  risks, benefits, and possible complications of the intervention; the alternatives, including doing nothing; the risk(s) and benefit(s) of the alternative treatment(s) or procedure(s); and the risk(s) and benefit(s) of doing nothing. The patient was provided information about the general risks and possible complications associated with the procedure. These may include, but are not limited to: failure to achieve desired goals, infection, bleeding, organ or nerve damage, allergic reactions, paralysis, and death. In addition, the patient was informed of those risks and complications associated to Spine-related procedures, such as failure to decrease pain; infection (i.e.: Meningitis, epidural or intraspinal abscess); bleeding (i.e.: epidural hematoma, subarachnoid hemorrhage, or any other type of intraspinal or peri-dural bleeding); organ or nerve damage (i.e.: Any type of peripheral nerve, nerve root, or spinal cord injury) with subsequent damage to sensory, motor, and/or autonomic systems, resulting in permanent pain, numbness, and/or weakness of one or several areas of the body; allergic reactions; (i.e.: anaphylactic reaction); and/or death. Furthermore, the patient was informed of those risks and complications associated with the medications. These include, but are not limited to: allergic reactions (i.e.: anaphylactic or anaphylactoid reaction(s)); adrenal axis suppression; blood sugar elevation that in diabetics may result in ketoacidosis or comma; water retention that in patients with history of congestive heart failure may result in  shortness of breath, pulmonary edema, and decompensation with resultant heart failure; weight gain; swelling or edema; medication-induced neural toxicity; particulate matter embolism and blood vessel occlusion with resultant organ, and/or nervous system infarction; and/or aseptic necrosis of one or more joints. Finally, the patient was informed that Medicine is not an exact science; therefore, there is also the possibility of unforeseen or unpredictable risks and/or possible complications that may result in a catastrophic outcome. The patient indicated having understood very clearly. We have given the patient no guarantees and we have made no promises. Enough time was given to the patient to ask questions, all of which were answered to the patient's satisfaction. Brandy [name] has indicated that she wanted to continue with the procedure. Attestation: I, the ordering provider, attest that I have discussed with the patient the benefits, risks, side-effects, alternatives, likelihood of achieving goals, and potential problems during recovery for the procedure that I have provided informed consent. Date  Time: 03/14/2022  9:46 AM  Pre-Procedure Preparation:  Monitoring: As per clinic protocol. Respiration, ETCO2, SpO2, BP, heart rate and rhythm monitor placed and checked for adequate function Safety Precautions: Patient was assessed for positional comfort and pressure points before starting the procedure. Time-out: I initiated and conducted the "Time-out" before starting the procedure, as per protocol. The patient was asked to participate by confirming the accuracy of the "Time Out" information. Verification of the correct person, site, and procedure were performed and confirmed by me, the nursing staff, and the patient. "Time-out" conducted as per Joint Commission's Universal Protocol (UP.01.01.01). Time: 1001  Description of Procedure:          Laterality: Bilateral. The procedure was performed in  identical fashion on both sides. Levels:  L2, L3, L4, L5,Medial Branch Level(s) Area Prepped: Posterior Lumbosacral Region DuraPrep (Iodine Povacrylex [0.7% available iodine] and Isopropyl Alcohol, 74% w/w) Safety Precautions: Aspiration looking for blood return was conducted prior to all injections. At no point did we inject any substances, as a needle was being advanced. Before injecting, the patient was told to immediately notify me if she was experiencing any new onset of "ringing in the ears, or metallic taste in the mouth". No attempts were made at seeking any paresthesias.  Safe injection practices and needle disposal techniques used. Medications properly checked for expiration dates. SDV (single dose vial) medications used. After the completion of the procedure, all disposable equipment used was discarded in the proper designated medical waste containers. Local Anesthesia: Protocol guidelines were followed. The patient was positioned over the fluoroscopy table. The area was prepped in the usual manner. The time-out was completed. The target area was identified using fluoroscopy. A 12-in long, straight, sterile hemostat was used with fluoroscopic guidance to locate the targets for each level blocked. Once located, the skin was marked with an approved surgical skin marker. Once all sites were marked, the skin (epidermis, dermis, and hypodermis), as well as deeper tissues (fat, connective tissue and muscle) were infiltrated with a small amount of a short-acting local anesthetic, loaded on a 10cc syringe with a 25G, 1.5-in  Needle. An appropriate amount of time was allowed for local anesthetics to take effect before proceeding to the next step. Local Anesthetic: Lidocaine 2.0% The unused portion of the local anesthetic was discarded in the proper designated containers. Technical explanation of process:  L2 Medial Branch Nerve Block (MBB): The target area for the L2 medial branch is at the junction of the  postero-lateral aspect of the superior articular process and the superior, posterior, and medial edge of the transverse process of L3. Under fluoroscopic guidance, a Quincke needle was inserted until contact was made with os over the superior postero-lateral aspect of the pedicular shadow (target area). After negative aspiration for blood, 43m of the nerve block solution was injected without difficulty or complication. The needle was removed intact. L3 Medial Branch Nerve Block (MBB): The target area for the L3 medial branch is at the junction of the postero-lateral aspect of the superior articular process and the superior, posterior, and medial edge of the transverse process of L4. Under fluoroscopic guidance, a Quincke needle was inserted until contact was made with os over the superior postero-lateral aspect of the pedicular shadow (target area). After negative aspiration for blood, 1 mL of the nerve block solution was injected without difficulty or complication. The needle was removed intact. L4 Medial Branch Nerve Block (MBB): The target area for the L4 medial branch is at the junction of the postero-lateral aspect of the superior articular process and the superior, posterior, and medial edge of the transverse process of L5. Under fluoroscopic guidance, a Quincke needle was inserted until contact was made with os over the superior postero-lateral aspect of the pedicular shadow (target area). After negative aspiration for blood, 1 mL of the nerve block solution was injected without difficulty or complication. The needle was removed intact. L5 Medial Branch Nerve Block (MBB): The target area for the L5 medial branch is at the junction of the postero-lateral aspect of the superior articular process and the superior, posterior, and medial edge of the sacral ala. Under fluoroscopic guidance, a Quincke needle was inserted until contact was made with os over the superior postero-lateral aspect of the pedicular  shadow (target area). After negative aspiration for blood, 1 mL of the nerve block solution was injected without difficulty or complication. The needle was removed intact.   Nerve block solution: 10 cc solution made of 8 cc of 0.2% ropivacaine, 2 cc of Decadron 10 mg/cc.  1 to 1.5 cc injected at each level above bilaterally.  The unused portion of the solution was discarded in the proper designated containers. Procedural Needles: 22-gauge, 3.5-inch, Quincke needles used for all levels.  Once the entire procedure  was completed, the treated area was cleaned, making sure to leave some of the prepping solution back to take advantage of its long term bactericidal properties.   Illustration of the posterior view of the lumbar spine and the posterior neural structures. Laminae of L2 through S1 are labeled. DPRL5, dorsal primary ramus of L5; DPRS1, dorsal primary ramus of S1; DPR3, dorsal primary ramus of L3; FJ, facet (zygapophyseal) joint L3-L4; I, inferior articular process of L4; LB1, lateral branch of dorsal primary ramus of L1; IAB, inferior articular branches from L3 medial branch (supplies L4-L5 facet joint); IBP, intermediate branch plexus; MB3, medial branch of dorsal primary ramus of L3; NR3, third lumbar nerve root; S, superior articular process of L5; SAB, superior articular branches from L4 (supplies L4-5 facet joint also); TP3, transverse process of L3.  Vitals:   03/14/22 0936 03/14/22 1000 03/14/22 1010 03/14/22 1015  BP: (!) 118/58 (!) 152/104 (!) 156/78 (!) 151/76  Pulse: (!) 106 (!) 108 94 93  Resp: '16 20 16 17  '$ Temp: (!) 97.2 F (36.2 C)     TempSrc: Temporal     SpO2: 97% 98% 98% 99%  Weight: 208 lb (94.3 kg)     Height: '5\' 7"'$  (1.702 m)        Start Time: 1001 hrs. End Time: 1013 hrs.  Imaging Guidance (Spinal):          Type of Imaging Technique: Fluoroscopy Guidance (Spinal) Indication(s): Assistance in needle guidance and placement for procedures requiring needle  placement in or near specific anatomical locations not easily accessible without such assistance. Exposure Time: Please see nurses notes. Contrast: None used. Fluoroscopic Guidance: I was personally present during the use of fluoroscopy. "Tunnel Vision Technique" used to obtain the best possible view of the target area. Parallax error corrected before commencing the procedure. "Direction-depth-direction" technique used to introduce the needle under continuous pulsed fluoroscopy. Once target was reached, antero-posterior, oblique, and lateral fluoroscopic projection used confirm needle placement in all planes. Images permanently stored in EMR. Interpretation: No contrast injected. I personally interpreted the imaging intraoperatively. Adequate needle placement confirmed in multiple planes. Permanent images saved into the patient's record.  Antibiotic Prophylaxis:   Anti-infectives (From admission, onward)    None      Indication(s): None identified  Post-operative Assessment:  Post-procedure Vital Signs:  Pulse/HCG Rate: 93  Temp: (!) 97.2 F (36.2 C) Resp: 17 BP: (!) 151/76 SpO2: 99 %  EBL: None  Complications: No immediate post-treatment complications observed by team, or reported by patient.  Note: The patient tolerated the entire procedure well. A repeat set of vitals were taken after the procedure and the patient was kept under observation following institutional policy, for this type of procedure. Post-procedural neurological assessment was performed, showing return to baseline, prior to discharge. The patient was provided with post-procedure discharge instructions, including a section on how to identify potential problems. Should any problems arise concerning this procedure, the patient was given instructions to immediately contact us, at any time, without hesitation. In any case, we plan to contact the patient by telephone for a follow-up status report regarding this  interventional procedure.  Comments:  No additional relevant information.  Plan of Care  Orders:  Orders Placed This Encounter  Procedures   DG PA[facility] C-ARM 1-60 MIN NO REPORT    Intraoperative interpretation by procedural physician at [facility].    Standing Status:   Standing    Number of Occurrences:   1  Order Specific Question:   Reason for exam:    Answer:   Assistance in needle guidance and placement for procedures requiring needle placement in or near specific anatomical locations not easily accessible without such assistance.   Medications ordered for procedure: Meds ordered this encounter  Medications   lidocaine (XYLOCAINE) 2 % (with pres) injection 400 mg   dexamethasone (DECADRON) injection 10 mg   dexamethasone (DECADRON) injection 10 mg   ropivacaine (PF) 2 mg/mL (0.2%) (NAROPIN) injection 9 mL   ropivacaine (PF) 2 mg/mL (0.2%) (NAROPIN) injection 9 mL   Medications administered: We administered lidocaine, dexamethasone, dexamethasone, ropivacaine (PF) 2 mg/mL (0.2%), and ropivacaine (PF) 2 mg/mL (0.2%).  See the medical record for exact dosing, route, and time of administration.  Follow-up plan:   Return in about 7 weeks (around 05/02/2022) for Post Procedure Evaluation, in person.       Interventional management options:  Considering:   Lumbar RFA SCS trial Sprint PNS medial branch Diagnostic caudal ESI  Diagnostic left sacroiliac joint block  Diagnostic right IA hip joint injection    Recent Visits Date Type Provider Dept  03/08/22 Office Visit Gillis Santa, MD [facility]  01/11/22 Office Visit Gillis Santa, MD [facility]  Showing recent visits within past 90 days and meeting all other requirements Today's Visits Date Type Provider Dept  03/14/22 Procedure visit Gillis Santa, MD [facility]  Showing today's visits and meeting all other requirements Future Appointments Date Type Provider Dept   05/02/22 Appointment Gillis Santa, [facility]  06/02/22 Appointment Gillis Santa, MD [facility]  Showing future appointments within next 90 days and meeting all other requirements  Disposition: Discharge home  Discharge (Date  Time): 03/14/2022; 1017 hrs.   Primary Care Physician: Sandrea Hughs, NP Location: [facility] Note by: Gillis Santa, MD Date: 03/14/2022; Time: 10:54 AM  Disclaimer:  Medicine is not an exact science. The only guarantee in medicine is that nothing is guaranteed. It is important to note that the decision to proceed with this intervention was based on the information collected from the patient. The Data and conclusions were drawn from the patient's questionnaire, the interview, and the physical examination. Because the information was provided in large part by the patient, it cannot be guaranteed that it has not been purposely or unconsciously manipulated. Every effort has been made to obtain as much relevant data as possible for this evaluation. It is important to note that the conclusions that lead to this procedure are derived in large part from the available data. Always take into account that the treatment will also be dependent on availability of resources and existing treatment guidelines, considered by other Pain Management Practitioners as being common knowledge and practice, at the time of the intervention. For Medico-Legal purposes, it is also important to point out that variation in procedural techniques and pharmacological choices are the acceptable norm. The indications, contraindications, technique, and results of the above procedure should only be interpreted and judged by a Board-Certified Interventional Pain Specialist with extensive familiarity and expertise in the same exact procedure and technique.

## 2022-03-14 NOTE — Progress Notes (Signed)
Safety precautions to be maintained throughout the outpatient stay will include: orient to surroundings, keep bed in low position, maintain call bell within reach at all times, provide assistance with transfer out of bed and ambulation.  

## 2022-03-15 ENCOUNTER — Telehealth: Payer: Self-pay

## 2022-03-15 ENCOUNTER — Encounter: Payer: Self-pay | Admitting: Hematology

## 2022-03-15 MED ORDER — POTASSIUM CHLORIDE CRYS ER 20 MEQ PO TBCR: 40.0000 meq | EXTENDED_RELEASE_TABLET | Freq: Two times a day (BID) | ORAL | 0 refills | Status: DC

## 2022-03-15 NOTE — Telephone Encounter (Signed)
Post procedure phone call. Patient states she is doing good.  

## 2022-03-16 ENCOUNTER — Ambulatory Visit (INDEPENDENT_AMBULATORY_CARE_PROVIDER_SITE_OTHER): Payer: Medicare Other | Admitting: Specialist

## 2022-03-16 ENCOUNTER — Encounter: Payer: Self-pay | Admitting: Specialist

## 2022-03-16 ENCOUNTER — Telehealth: Payer: Self-pay | Admitting: Family

## 2022-03-16 VITALS — BP 111/66 | HR 109 | Ht 67.0 in | Wt 213.0 lb

## 2022-03-16 DIAGNOSIS — M51369 Other intervertebral disc degeneration, lumbar region without mention of lumbar back pain or lower extremity pain: Secondary | ICD-10-CM

## 2022-03-16 DIAGNOSIS — M4316 Spondylolisthesis, lumbar region: Secondary | ICD-10-CM

## 2022-03-16 DIAGNOSIS — M5136 Other intervertebral disc degeneration, lumbar region: Secondary | ICD-10-CM | POA: Diagnosis not present

## 2022-03-16 DIAGNOSIS — N289 Disorder of kidney and ureter, unspecified: Secondary | ICD-10-CM | POA: Diagnosis not present

## 2022-03-16 DIAGNOSIS — E876 Hypokalemia: Secondary | ICD-10-CM

## 2022-03-16 DIAGNOSIS — M4310 Spondylolisthesis, site unspecified: Secondary | ICD-10-CM

## 2022-03-16 DIAGNOSIS — M47816 Spondylosis without myelopathy or radiculopathy, lumbar region: Secondary | ICD-10-CM

## 2022-03-16 NOTE — Telephone Encounter (Signed)
Appointment scheduled.

## 2022-03-16 NOTE — Telephone Encounter (Signed)
-----   Message from Brunetta Genera, MD sent at 03/15/2022 11:39 PM EDT ----- Eustaquio Maize, Plz let patient know that I have sent prescription for potassium to her pharmacy . Her potassium levels are quite low likely from the fludrocortisone and will need continue evaluation and management with her PCP. Plz recommend she have her labs recheck for correction of potassium with her PCP in 1 week.  Sullivan Lone MD

## 2022-03-16 NOTE — Telephone Encounter (Signed)
Please call patient to schedule lab appointment to recheck potassium level as soon as possible this week as directed by Oncologist Dr.Kale. BMP ordered.

## 2022-03-16 NOTE — Progress Notes (Signed)
Office Visit Note   Patient: Brandy [name]           Date of Birth: 07-11-1965           MRN: [id number] Visit Date: 03/16/2022              Requested by: Sandrea Hughs, NP [address] [place],  [place] [address] PCP: Sandrea Hughs, NP   Assessment & Plan: Visit Diagnoses:  1. Grade 1 Anterolisthesis (L2-3, L3-4)   2. Spondylolisthesis of lumbar region   3. Grade 1 Retrolisthesis (L4-5, L5-S1)   4. Spondylosis without myelopathy or radiculopathy, lumbar region   5. Renal impairment   6. Degenerative disc disease, lumbar     Plan: Avoid bending, stooping and avoid lifting weights greater than 10 lbs. Avoid prolong standing and walking. Avoid frequent bending and stooping  No lifting greater than 10 lbs. May use ice or moist heat for pain. Weight loss is of benefit. Handicap license is approved. In pain management at [facility] for pain through pain management.   Appt with neurologist to assess for ongoing radiculopathy vs neuropathy due to diabetes.  Do not take NSAIDS or arthritis medications due to risk of kidney injury. Follow-Up Instructions: No follow-ups on file.   Orders:  No orders of the defined types were placed in this encounter.  No orders of the defined types were placed in this encounter.     Procedures: No procedures performed   Clinical Data: Findings:  CLINICAL DATA: Spinal stenosis, lumbar. Low back pain radiating to bilateral sides/legs with numbness and weakness in bilateral legs for years. History of lumbar spine laminectomy in 2017.  EXAM: MRI LUMBAR SPINE WITHOUT CONTRAST  TECHNIQUE: Multiplanar, multisequence MR imaging of the lumbar spine was performed. No intravenous contrast was administered.  COMPARISON: MR lumbar 01/13/2017; X-ray lumbar 08/26/2021.  FINDINGS: Segmentation: 5 lumbar vertebra  Alignment: 4 mm anterolisthesis L2-3 is unchanged. 2 mm anterolisthesis L3-4  unchanged. 4 mm retrolisthesis L4-5 and L5-S1 also unchanged.  Vertebrae: Negative for fracture or mass. Normal appearing bone marrow.  Conus medullaris and cauda equina: Conus extends to the L2-3 level. Conus and cauda equina appear normal.  Paraspinal and other soft tissues: Negative for paraspinous mass, adenopathy, fluid collection  Disc levels:  L1-2: Minimal disc degeneration. Moderate to advanced facet degeneration. Interval development of posterior epidural synovial cyst measuring 7 x 8 mm. This flattens the posterior thecal sac but is not causing significant spinal stenosis. Generous size spinal canal.  L2-3: Anterolisthesis. Moderate to advanced facet degeneration bilaterally. No significant stenosis  L3-4: Mild anterolisthesis. Shallow right foraminal disc protrusion unchanged from the prior study. Mild disc degeneration and moderate facet degeneration. Negative for stenosis.  L4-5: Left laminotomy. Diffuse bulging of the disc and mild facet degeneration. Mild left subarticular stenosis, unchanged. Spinal canal adequate in size  L5-S1: Left laminotomy. Retrolisthesis with disc degeneration and spurring. Bilateral facet degeneration. Mild subarticular stenosis bilaterally. No interval change.  IMPRESSION: Postop laminectomy left L4-5 and L5-S1. No recurrent disc protrusion. Subarticular stenosis left L4-5 unchanged. Subarticular stenosis bilaterally L5-S1 unchanged  Multilevel facet degeneration. Interval development of 7 x 8 mm synovial cyst at L1-2 projecting into the posterior epidural space in the midline. This is not causing significant spinal stenosis.   Electronically Signed By: Franchot Gallo M.D. On: 09/29/2021 10:49      Subjective: No chief complaint on file.   56 year old female with lumbar spondylosis and degenerative disc  disease. Underwent a left L4-5 and L5-S1 partial hemilaminectomies about 5 years ago. Complicated with level done that  was not planned. Settled with her legally. No bowel or bladder difficulty. She had recent RFA of  L3, L4 and L5 levels by Dr. Stann Mainland at [facility]. She is taking Belbuca films Twice daily and this is strong according to [place]. Feels like the left ankle gives away while walking and she is concerned about this. 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 Noticing she is walking greater distance even before recent RFA. Since the RFA she reports pain is better. She has discontinued oxycodone and is on the [place].    Review of Systems  Constitutional: Negative.   HENT: Negative.    Eyes: Negative.   Respiratory: Negative.    Cardiovascular: Negative.   Gastrointestinal: Negative.   Endocrine: Negative.   Genitourinary: Negative.   Musculoskeletal: Negative.   Skin: Negative.   Allergic/Immunologic: Negative.   Neurological: Negative.   Hematological: Negative.   Psychiatric/Behavioral: Negative.       Objective: Vital Signs: BP 111/66   Pulse (!) 109   Ht '5\' 7"'$  (1.702 m)   Wt 213 lb (96.6 kg)   BMI 33.36 kg/m   Physical Exam Constitutional:      Appearance: She is well-developed.  HENT:     Head: Normocephalic and atraumatic.  Eyes:     Pupils: Pupils are equal, round, and reactive to light.  Pulmonary:     Effort: Pulmonary effort is normal.     Breath sounds: Normal breath sounds.  Abdominal:     General: Bowel sounds are normal.     Palpations: Abdomen is soft.  Musculoskeletal:     Cervical back: Normal range of motion and neck supple.     Lumbar back: Negative right straight leg raise test and negative left straight leg raise test.  Skin:    General: Skin is warm and dry.  Neurological:     Mental Status: She is alert and oriented to person, place, and time.  Psychiatric:        Behavior: Behavior normal.        Thought Content: Thought content normal.        Judgment: Judgment normal.    Back Exam   Tenderness  The patient is  experiencing tenderness in the lumbar.  Range of Motion  Extension:  abnormal  Flexion:  abnormal  Lateral bend right:  abnormal  Lateral bend left:  abnormal  Rotation right:  abnormal  Rotation left:  abnormal   Muscle Strength  Right Quadriceps:  5/5  Left Quadriceps:  5/5  Right Hamstrings:  5/5  Left Hamstrings:  5/5   Tests  Straight leg raise right: negative Straight leg raise left: negative  Other  Toe walk: normal Heel walk: normal  Comments:  Has weakness left leg foot DF that is 5-/5     Specialty Comments:  MRI LUMBAR SPINE WITHOUT CONTRAST   TECHNIQUE: Multiplanar, multisequence MR imaging of the lumbar spine was performed. No intravenous contrast was administered.   COMPARISON:  MR lumbar 01/13/2017; X-ray lumbar 08/26/2021.   FINDINGS: Segmentation:  5 lumbar vertebra   Alignment: 4 mm anterolisthesis L2-3 is unchanged. 2 mm anterolisthesis L3-4 unchanged. 4 mm retrolisthesis L4-5 and L5-S1 also unchanged.   Vertebrae: Negative for fracture or mass. Normal appearing bone marrow.   Conus medullaris and cauda equina: Conus extends to the L2-3 level. Conus and cauda equina appear normal.  Paraspinal and other soft tissues: Negative for paraspinous mass, adenopathy, fluid collection   Disc levels:   L1-2: Minimal disc degeneration. Moderate to advanced facet degeneration. Interval development of posterior epidural synovial cyst measuring 7 x 8 mm. This flattens the posterior thecal sac but is not causing significant spinal stenosis. Generous size spinal canal.   L2-3: Anterolisthesis. Moderate to advanced facet degeneration bilaterally. No significant stenosis   L3-4: Mild anterolisthesis. Shallow right foraminal disc protrusion unchanged from the prior study. Mild disc degeneration and moderate facet degeneration. Negative for stenosis.   L4-5: Left laminotomy. Diffuse bulging of the disc and mild facet degeneration. Mild left  subarticular stenosis, unchanged. Spinal canal adequate in size   L5-S1: Left laminotomy. Retrolisthesis with disc degeneration and spurring. Bilateral facet degeneration. Mild subarticular stenosis bilaterally. No interval change.   IMPRESSION: Postop laminectomy left L4-5 and L5-S1. No recurrent disc protrusion. Subarticular stenosis left L4-5 unchanged. Subarticular stenosis bilaterally L5-S1 unchanged   Multilevel facet degeneration. Interval development of 7 x 8 mm synovial cyst at L1-2 projecting into the posterior epidural space in the midline. This is not causing significant spinal stenosis.     Electronically Signed   By: Franchot Gallo M.D.   On: 09/29/2021 10:49  Imaging: No results found.   PMFS History: Patient Active Problem List   Diagnosis Date Noted   Spinal stenosis, 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    Priority: High    Class: Chronic   Left shoulder tendonitis 05/23/2016    Priority: High    Class: Acute   Snoring 07/14/2021   Fatigue 07/14/2021   Daytime somnolence 07/14/2021   Status post device closure of ASD 06/03/2021   Secundum ASD 06/03/2021   Decreased diffusion capacity 05/14/2021   Shortness of breath 03/04/2021   Restrictive lung disease 04/01/2020   Pulmonary nodules 04/01/2020   Elevated LFTs 03/26/2020   Primary malignant neoplasm of breast with metastasis ([place]) 02/24/2020   Spondylosis without myelopathy or radiculopathy, lumbosacral region 12/31/2019   Chronic low back pain (Bilateral) w/o sciatica 12/31/2019   Chronic hip pain (3ry area of Pain) (Bilateral) (R>L) 11/27/2019   Chronic sacroiliac joint pain (Left) 11/27/2019   Chronic pain syndrome 11/11/2019   Pharmacologic therapy 11/11/2019   Disorder of skeletal system 11/11/2019   Problems influencing health status 11/11/2019   Abnormal MRI, lumbar spine (08/22/2018) 11/11/2019   Chronic low back pain (1ry area of Pain) (Bilateral) w/ sciatica  (Bilateral) 11/11/2019   DDD (degenerative disc disease), lumbosacral 11/11/2019   Grade 1 Anterolisthesis (L2-3, L3-4) 11/11/2019   Grade 1 Retrolisthesis (L4-5, L5-S1) 11/11/2019   Lumbar facet hypertrophy (Multilevel) (Bilateral) 11/11/2019   Lumbar facet syndrome (Multilevel) (Bilateral) (R>L) 11/11/2019   Chronic lower extremity pain (2ry area of Pain) (Bilateral) (R>L) 11/11/2019   Neurogenic pain 11/11/2019   Chronic musculoskeletal pain 11/11/2019   Infiltrating ductal carcinoma of breast ([place]) 10/08/2019   Diarrhea 09/04/2019   Fatty liver 09/04/2019   Nausea & vomiting 09/04/2019   Obesity (BMI 30-39.9) 09/04/2019   GERD (gastroesophageal reflux disease)    Hyperlipidemia    CKD (chronic kidney disease), stage III (HCC)    Depression    Malignant hypertension    Transaminitis    Pneumonitis 08/04/2019   Breast cancer ([place]) 08/04/2019   Tachycardia    Ocular proptosis 02/14/2019   PVD (posterior vitreous detachment), left eye 02/14/2019   Chorioretinal scar of left eye 02/14/2019   Vitreous floaters of left eye 02/14/2019   Bilateral leg weakness  01/24/2019   Insomnia 08/02/2017   Low back pain radiating to both legs 08/02/2017   Migraine 08/02/2017   Neuropathy 08/02/2017   Post laminectomy syndrome 05/23/2016   Anxiety disorder 01/07/2015   Past Medical History:  Diagnosis Date   Acute pansinusitis 08/02/2017   Anxiety    Arthritis    ASD (atrial septal defect)    s/p closure with Amplatzer device 10/05/04 (Dr. Myriam Jacobson, [facility]) 10/05/04   Cancer ([place])    Cataract    Dyspnea    GERD (gastroesophageal reflux disease)    Heart murmur    no longer heard   History of hiatal hernia    Legally blind in right eye, as defined in [place]    Lumbar herniated disc    PONV (postoperative nausea and vomiting)    Sciatica     Family History  Problem Relation Age of Onset   Arthritis Mother    Hypertension Mother    Diabetes Mother    High blood pressure Mother     Cancer Father        Lung   Colon cancer Neg Hx    Colon polyps Neg Hx    Esophageal cancer Neg Hx    Rectal cancer Neg Hx    Stomach cancer Neg Hx     Past Surgical History:  Procedure Laterality Date   ABLATION     BREAST LUMPECTOMY WITH RADIOACTIVE SEED AND SENTINEL LYMPH NODE BIOPSY Right 11/23/2020   Procedure: RIGHT BREAST LUMPECTOMY WITH RADIOACTIVE SEED AND RIGHT AXILLARY SENTINEL LYMPH NODE BIOPSY;  Surgeon: Rolm Bookbinder, MD;  Location: [facility];  Service: General;  Laterality: Right;   BREAST SURGERY Bilateral 2011   Breast Reduction Surgery   BUNIONECTOMY     CARDIAC CATHETERIZATION     10/05/04 [facility]): LM < 25%, otherwise normal coronaries. No pulmonary HTN, Mildly enlarged RV. Secundum ASD s/p closure.   CARDIAC SURGERY     CATARACT EXTRACTION     CLEFT PALATE REPAIR     s/p cleft lip and palate repair   EYE SURGERY Right 2019   right eye removed   LUMBAR LAMINECTOMY/DECOMPRESSION MICRODISCECTOMY Left 05/23/2016   Procedure: LEFT L4-L5 LATERAL RECESS DECOMPRESSION WITH CENTRAL AND RIGHT DECOMPRESSION VIA LEFT SIDE;  Surgeon: [name], MD;  Location: [facility];  Service: Orthopedics;  Laterality: Left;   LUMBAR LAMINECTOMY/DECOMPRESSION MICRODISCECTOMY Left 05/23/2016   Procedure: LUMBAR LAMINECTOMY/DECOMPRESSION MICRODISCECTOMY Lumbar five - Sacral One 1 LEVEL;  Surgeon: [name], MD;  Location: [facility];  Service: Orthopedics;  Laterality: Left;   REDUCTION MAMMAPLASTY  2011   SHOULDER INJECTION Left 05/23/2016   Procedure: SHOULDER INJECTION;  Surgeon: [name], MD;  Location: [facility];  Service: Orthopedics;  Laterality: Left;  band-aid per pa-c   TRANSTHORACIC ECHOCARDIOGRAM     12/15/05 [facility]): Mild LVH, EF > 62%, grade 1 diastolic dysfunction, Trivial MR/PR/TR.   TUBAL LIGATION     Social History   Occupational History   Not on file  Tobacco Use   Smoking status: Former    Packs/day: 0.25    Years: 25.00    Total pack years: 6.25    Types:  Cigarettes    Quit date: 02/04/2018    Years since quitting: 4.1   Smokeless tobacco: Never  Vaping Use   Vaping Use: Never used  Substance and Sexual Activity   Alcohol use: No   Drug use: No   Sexual activity: Not on file

## 2022-03-16 NOTE — Patient Instructions (Addendum)
Plan: Avoid bending, stooping and avoid lifting weights greater than 10 lbs. Avoid prolong standing and walking. Avoid frequent bending and stooping  No lifting greater than 10 lbs. May use ice or moist heat for pain. Weight loss is of benefit. Handicap license is approved. In pain management at [facility] for pain through pain management.   Appt with neurologist to assess for ongoing radiculopathy vs neuropathy due to diabetes.  Do not take NSAIDS or arthritis medications due to risk of kidney injury.

## 2022-03-18 ENCOUNTER — Other Ambulatory Visit: Payer: Medicare Other

## 2022-03-18 ENCOUNTER — Other Ambulatory Visit: Payer: Medicare Other | Admitting: Family

## 2022-03-22 ENCOUNTER — Other Ambulatory Visit: Payer: Medicare Other

## 2022-03-22 ENCOUNTER — Other Ambulatory Visit: Payer: Self-pay

## 2022-03-22 DIAGNOSIS — E876 Hypokalemia: Secondary | ICD-10-CM

## 2022-03-23 LAB — BASIC METABOLIC PANEL WITH GFR
BUN/Creatinine Ratio: 6 (calc) (ref 6–22)
BUN: 10 mg/dL (ref 7–25)
CO2: 28 mmol/L (ref 20–32)
Calcium: 9.1 mg/dL (ref 8.6–10.4)
Chloride: 102 mmol/L (ref 98–110)
Creat: 1.57 mg/dL — ABNORMAL HIGH (ref 0.50–1.03)
Glucose, Bld: 106 mg/dL — ABNORMAL HIGH (ref 65–99)
Potassium: 3.6 mmol/L (ref 3.5–5.3)
Sodium: 143 mmol/L (ref 135–146)
eGFR: 38 mL/min/{1.73_m2} — ABNORMAL LOW (ref >=60)

## 2022-03-25 ENCOUNTER — Inpatient Hospital Stay: Payer: Medicare Other

## 2022-03-25 ENCOUNTER — Other Ambulatory Visit: Payer: Self-pay

## 2022-03-25 VITALS — BP 122/66 | HR 88 | Temp 98.2°F | Resp 18

## 2022-03-25 DIAGNOSIS — M4802 Spinal stenosis, cervical region: Secondary | ICD-10-CM | POA: Diagnosis not present

## 2022-03-25 DIAGNOSIS — C50411 Malignant neoplasm of upper-outer quadrant of right female breast: Secondary | ICD-10-CM | POA: Diagnosis not present

## 2022-03-25 DIAGNOSIS — Z8249 Family history of ischemic heart disease and other diseases of the circulatory system: Secondary | ICD-10-CM | POA: Diagnosis not present

## 2022-03-25 DIAGNOSIS — Z87891 Personal history of nicotine dependence: Secondary | ICD-10-CM | POA: Diagnosis not present

## 2022-03-25 DIAGNOSIS — Z170 Estrogen receptor positive status [ER+]: Secondary | ICD-10-CM | POA: Diagnosis not present

## 2022-03-25 DIAGNOSIS — C7801 Secondary malignant neoplasm of right lung: Secondary | ICD-10-CM | POA: Diagnosis not present

## 2022-03-25 DIAGNOSIS — C7802 Secondary malignant neoplasm of left lung: Secondary | ICD-10-CM | POA: Diagnosis not present

## 2022-03-25 DIAGNOSIS — M5031 Other cervical disc degeneration,  high cervical region: Secondary | ICD-10-CM | POA: Diagnosis not present

## 2022-03-25 DIAGNOSIS — I700 Atherosclerosis of aorta: Secondary | ICD-10-CM | POA: Diagnosis not present

## 2022-03-25 DIAGNOSIS — Z5111 Encounter for antineoplastic chemotherapy: Secondary | ICD-10-CM | POA: Diagnosis not present

## 2022-03-25 DIAGNOSIS — K219 Gastro-esophageal reflux disease without esophagitis: Secondary | ICD-10-CM | POA: Diagnosis not present

## 2022-03-25 DIAGNOSIS — E876 Hypokalemia: Secondary | ICD-10-CM | POA: Diagnosis not present

## 2022-03-25 DIAGNOSIS — Z833 Family history of diabetes mellitus: Secondary | ICD-10-CM | POA: Diagnosis not present

## 2022-03-25 DIAGNOSIS — Z801 Family history of malignant neoplasm of trachea, bronchus and lung: Secondary | ICD-10-CM | POA: Diagnosis not present

## 2022-03-25 DIAGNOSIS — C50919 Malignant neoplasm of unspecified site of unspecified female breast: Secondary | ICD-10-CM

## 2022-03-25 DIAGNOSIS — R7989 Other specified abnormal findings of blood chemistry: Secondary | ICD-10-CM | POA: Diagnosis not present

## 2022-03-25 DIAGNOSIS — K760 Fatty (change of) liver, not elsewhere classified: Secondary | ICD-10-CM | POA: Diagnosis not present

## 2022-03-25 DIAGNOSIS — Z8261 Family history of arthritis: Secondary | ICD-10-CM | POA: Diagnosis not present

## 2022-03-25 LAB — CMP (CANCER CENTER ONLY)
ALT: 34 U/L (ref 0–44)
AST: 39 U/L (ref 15–41)
Albumin: 3.4 g/dL — ABNORMAL LOW (ref 3.5–5.0)
Alkaline Phosphatase: 158 U/L — ABNORMAL HIGH (ref 38–126)
Anion gap: 7 (ref 5–15)
BUN: 8 mg/dL (ref 6–20)
CO2: 33 mmol/L — ABNORMAL HIGH (ref 22–32)
Calcium: 8.8 mg/dL — ABNORMAL LOW (ref 8.9–10.3)
Chloride: 103 mmol/L (ref 98–111)
Creatinine: 1.62 mg/dL — ABNORMAL HIGH (ref 0.44–1.00)
GFR, Estimated: 37 mL/min — ABNORMAL LOW (ref >60)
Glucose, Bld: 150 mg/dL — ABNORMAL HIGH (ref 70–99)
Potassium: 3 mmol/L — ABNORMAL LOW (ref 3.5–5.1)
Sodium: 143 mmol/L (ref 135–145)
Total Bilirubin: 0.8 mg/dL (ref 0.3–1.2)
Total Protein: 6.6 g/dL (ref 6.5–8.1)

## 2022-03-25 LAB — CBC WITH DIFFERENTIAL (CANCER CENTER ONLY)
Abs Immature Granulocytes: 0.02 10*3/uL (ref 0.00–0.07)
Basophils Absolute: 0 10*3/uL (ref 0.0–0.1)
Basophils Relative: 1 %
Eosinophils Absolute: 0 10*3/uL (ref 0.0–0.5)
Eosinophils Relative: 1 %
HCT: 32.4 % — ABNORMAL LOW (ref 36.0–46.0)
Hemoglobin: 11 g/dL — ABNORMAL LOW (ref 12.0–15.0)
Immature Granulocytes: 0 %
Lymphocytes Relative: 52 %
Lymphs Abs: 2.7 10*3/uL (ref 0.7–4.0)
MCH: 35.6 pg — ABNORMAL HIGH (ref 26.0–34.0)
MCHC: 34 g/dL (ref 30.0–36.0)
MCV: 104.9 fL — ABNORMAL HIGH (ref 80.0–100.0)
Monocytes Absolute: 0.3 10*3/uL (ref 0.1–1.0)
Monocytes Relative: 6 %
Neutro Abs: 2 10*3/uL (ref 1.7–7.7)
Neutrophils Relative %: 40 %
Platelet Count: 175 10*3/uL (ref 150–400)
RBC: 3.09 MIL/uL — ABNORMAL LOW (ref 3.87–5.11)
RDW: 15.5 % (ref 11.5–15.5)
WBC Count: 5.1 10*3/uL (ref 4.0–10.5)
nRBC: 0 % (ref 0.0–0.2)

## 2022-03-25 MED ORDER — FULVESTRANT 250 MG/5ML IM SOSY
500.0000 mg | PREFILLED_SYRINGE | Freq: Once | INTRAMUSCULAR | Status: AC
  Administered 2022-03-25: 500 mg via INTRAMUSCULAR
  Filled 2022-03-25: qty 10

## 2022-03-25 NOTE — Progress Notes (Signed)
Per on call Md Gudena Patient should get Faslodex injection today and then return to monthly following todays treatment.

## 2022-03-29 ENCOUNTER — Encounter: Payer: Self-pay | Admitting: Student in an Organized Health Care Education/Training Program

## 2022-03-29 NOTE — Telephone Encounter (Signed)
We have a few discount cards that you can try but I do not know if they will work with your insurance plan or medication. I will leave them up front at the desk with your name on them. Hope this helps.                                                                                                                                                             Anderson Malta RN [phone]

## 2022-04-01 ENCOUNTER — Telehealth: Payer: Self-pay | Admitting: Pharmacy Technician

## 2022-04-01 NOTE — Telephone Encounter (Signed)
Oral Oncology Patient Advocate Encounter  Reached out and spoke with patient regarding PAP paperwork, explained that I would send it to their preferred email via the [employer] application  Confirmed email address:  [email or web address]   Patient expressed understanding and consent.  Will follow up once paperwork has been signed and returned.   Lady Deutscher, CPhT-Adv Oncology Pharmacy Patient [place] Direct Number: [phone]  Fax: [phone]

## 2022-04-01 NOTE — Telephone Encounter (Signed)
Oral Oncology Patient Advocate Encounter   Received notification that patient is due for re-enrollment for assistance for Verzenio through [employer].   Re-enrollment process has been initiated and will be submitted upon completion of necessary documents.  [employer] number [phone].   I will continue to follow until final determination.  Lady Deutscher, CPhT-Adv Oncology Pharmacy Patient [place] Direct Number: [phone]  Fax: [phone]

## 2022-04-05 NOTE — Telephone Encounter (Signed)
Oral Oncology Patient Advocate Encounter   Submitted MD signatures for assistance application via online portal.  Lady Deutscher, CPhT-Adv Oncology Pharmacy Patient [place] Direct Number: [phone]  Fax: [phone]

## 2022-04-07 NOTE — Telephone Encounter (Signed)
Oral Oncology Patient Advocate Encounter   Received notification re-enrollment for assistance for Verzenio through [employer] has been approved. Patient may continue to receive their medication at $0 from this program.    [employer] phone number [phone].   Effective dates: 06/06/22 through 06/06/23  I have spoken to the patient.  Brandy [name], CPhT-Adv Oncology Pharmacy Patient Advocate [facility] Direct Number: [phone]  Fax: [phone]   

## 2022-04-07 NOTE — Progress Notes (Signed)
NEUROLOGY FOLLOW UP OFFICE NOTE  [place] [id number]  Assessment/Plan:   Migraine without aura, without status migrainosus, intractable Probable essential tremor.  Not appreciated on exam today.  Does not exhibit findings on exam consistent with Parkinson's disease Chronic back pain - treated by other provider Orthostatic hypotension - treated by cardiology   Migraine prevention:  Advised to discuss with Dr. Louanne Skye to transition back from duloxetine to amitriptyline '75mg'$  at bedtime as it better managed her migraines, neuropathic pain and insomnia Migraine rescue:  will have her try samples of Ubrelvy '100mg'$ .  Zofran '8mg'$  for nausea Limit use of pain relievers to no more than 2 days out of week to prevent risk of rebound or medication-overuse headache. Keep headache diary Follow up 5 months.       Subjective:  Brandy [name] is a 56 year old female with ASD s/p closure, arthritis,right eye prosthesis and breast cancer who follows up for migraine.  UPDATE: Dr. Louanne Skye switched her from duloxetine back to amitriptyline.  Down the line, she was told she will need the cervical spine fusion. Roselyn Meier was ineffective. Intensity:  severe Duration:  3 days Frequency:  no recent typical headaches  She has been experiencing newer headaches 4 months ago.  Dull pain across back of head and down the neck bilaterally worse on the left.  She gets it about 1 to 2 times a month.  Lasts 20 to 30 minutes.    Current NSAIDS/analgesics:  acetaminophen Current triptans:  none Current ergotamine:  none Current anti-emetic:  Zofran '8mg'$  Current muscle relaxants:  methocarbamol  Current Antihypertensive medications:  none Current Antidepressant medications:  amitriptyline '75mg'$  QHS Current Anticonvulsant medications:  none Current anti-CGRP:  none Current Vitamins/Herbal/Supplements:  magnesium oxide '400mg'$  BID, B12 Current Antihistamines/Decongestants:  none Other  therapy:  none Other medications:  abemaciclib, alprazolam, albuterol, midodrine '10mg'$  TID, Florinef  Caffeine:  1 small cup of coffee daily.   Diet:  2 cups of water daily.  Skips meals.  Rarely drinks clear soft drinks Exercise:  trying to Depression:  no; Anxiety:  yes Other pain:  Chronic low back pain with sciatica followed by orthopedics.  Sees Dr. Holley Raring of pain management which helps.   Sleep hygiene:  poor since changed from amitriptyline to duloxetine  HISTORY:  Reports frequent headaches since she was in third grade.  The are severe bitemporal and retro-orbital headaches.  They last 3-5 days.  Occurs at least twice a month.  It used to occur more frequently.  Associated nausea, vomiting, photophobia, phonophobia, osmophobia.  Movement aggravates it.  Vomiting helps, sometimes projectile vomiting.  Recently changed by her spine specialist, Dr. Louanne Skye, from amitriptyline to duloxetine but feels that the amitriptyline helped better for migraines, nerve pain and insomnia.     She is followed by cardiology for orthostatic hypotension.  Treated with midodrine.  She was initially experiencing shortness of breath and chest discomfort.  Coronary CTA and echocardiogram were unremarkable.   She has frequent falls due to orthostasis or sometimes legs may just give out.     History of chronic neck pain and low back pain with sciatica.  Has been followed by orthopedics.  Underwent workup in April.  MRI of brain without contrast was unremarkable.  MRI of cervical spine showed diffuse cervical disc degeneration and advanced facet arthrosis with mild spinal stenosis at C3-4 and C4-5 and moderate right neural foraminal stenosis at C4-5.  MRI of thoracic spine showed mild disc and moderate facet  degeneration without spinal or high-grade neural foraminal stenosis.  MRI of lumbar spine showed post-op laminectomy left L4-5 and L5-S1 without recurrent disc protrusion, subarticular stenosis left L4-5 and bilateral  L5-S1 and multilevel face degeneration with 7 x 8 mm synovial cyst at L1-2 projecting into posterior epidural space in the midline but not causing significant stenosis.     Notices a tremor, let worse than right.  Does not happen all of the time.  Noticeable particularly with use such as holding an object..  No family history.    Past NSAIDS/analgesics:  ibuprofen, naproxen, tramadol Past abortive triptans:  sumatriptan tab/NS, rizatriptan Past abortive ergotamine:  none Past muscle relaxants:  baclofen, cylcobenzaprine Past anti-emetic:  none Past antihypertensive medications:  none Past antidepressant medications:  duloxetine, sertraline Past anticonvulsant medications:  topiramate, Depakote, gabapentin Past anti-CGRP:  none Past vitamins/Herbal/Supplements:  none Past antihistamines/decongestants:  meclizine Other past therapies:  none    Family history of headache:  mother (migraines), maternal grandmother (migraines), son (migraines), daughter (migraines), maternal aunt (migraines)  PAST MEDICAL HISTORY: Past Medical History:  Diagnosis Date   Acute pansinusitis 08/02/2017   Anxiety    Arthritis    ASD (atrial septal defect)    s/p closure with Amplatzer device 10/05/04 (Dr. Myriam Jacobson, [facility]) 10/05/04   Cancer ([place])    Cataract    Dyspnea    GERD (gastroesophageal reflux disease)    Heart murmur    no longer heard   History of hiatal hernia    Legally blind in right eye, as defined in [place]    Lumbar herniated disc    PONV (postoperative nausea and vomiting)    Sciatica     MEDICATIONS: Current Outpatient Medications on File Prior to Visit  Medication Sig Dispense Refill   albuterol (PROVENTIL) (2.5 MG/3ML) 0.083% nebulizer solution Take 3 mLs (2.5 mg total) by nebulization every 6 (six) hours as needed for wheezing or shortness of breath. 180 mL 1   ALPRAZolam (XANAX) 1 MG tablet TAKE 1 TABLET BY MOUTH AT  BEDTIME 30 tablet 5   amitriptyline (ELAVIL) 75 MG  tablet Take 75 mg by mouth at bedtime.     Buprenorphine HCl (BELBUCA) 150 MCG FILM Place 1 Film inside cheek every 12 (twelve) hours. 60 Film 2   Carboxymethylcellul-Glycerin (LUBRICATING EYE DROPS OP) Place 1 drop into the left eye 4 (four) times daily.     diphenoxylate-atropine (LOMOTIL) 2.5-0.025 MG tablet Take 1 tablet by mouth 3 (three) times daily as needed for diarrhea or loose stools. 30 tablet 0   fludrocortisone (FLORINEF) 0.1 MG tablet Take 1 tablet (0.1 mg total) by mouth 3 (three) times daily. 270 tablet 3   fluticasone-salmeterol (WIXELA INHUB) 250-50 MCG/ACT AEPB Inhale 1 puff into the lungs in the morning and at bedtime. 60 each 1   ketoconazole (NIZORAL) 2 % cream SMARTSIG:1 Application Topical 1 to 2 Times Daily     ketorolac (ACULAR) 0.5 % ophthalmic solution Place 1 drop into the left eye 3 (three) times daily.     loperamide (IMODIUM) 2 MG capsule Take 1-2 capsules (2-4 mg total) by mouth 4 (four) times daily as needed for diarrhea or loose stools. 30 capsule 1   methocarbamol (ROBAXIN) 500 MG tablet Take 1 tablet (500 mg total) by mouth every 6 (six) hours as needed for muscle spasms. 40 tablet 2   midodrine (PROAMATINE) 10 MG tablet Take 1 tablet (10 mg total) by mouth 3 (three) times daily. 270 tablet 3  Multiple Vitamin (MULTIVITAMIN WITH MINERALS) TABS tablet Take 1 tablet by mouth daily.     omeprazole (PRILOSEC OTC) 20 MG tablet Take 20 mg by mouth daily.     ondansetron (ZOFRAN) 8 MG tablet TAKE 1 TABLET(8 MG) BY MOUTH EVERY 8 HOURS AS NEEDED FOR NAUSEA OR VOMITING 30 tablet 3   potassium chloride SA (KLOR-CON M) 20 MEQ tablet Take 2 tablets (40 mEq total) by mouth 2 (two) times daily. 30 tablet 0   rosuvastatin (CRESTOR) 10 MG tablet TAKE 1 TABLET BY MOUTH DAILY 100 tablet 2   triamcinolone cream (KENALOG) 0.1 % SMARTSIG:1 Application Topical 2-3 Times Daily     VERZENIO 100 MG tablet TAKE 1 TABLET BY MOUTH TWICE DAILY 56 tablet 0   Vitamin D, Ergocalciferol,  (DRISDOL) 1.25 MG (50000 UNIT) CAPS capsule TAKE 1 CAPSULE ONCE PER WEEK 12 capsule 3   White Petrolatum-Mineral Oil (LUBRICANT EYE NIGHTTIME) OINT Place 1 drop into the left eye at bedtime.     XIIDRA 5 % SOLN Place 1 drop into the left eye in the morning, at noon, in the evening, and at bedtime.     No current facility-administered medications on file prior to visit.    ALLERGIES: Allergies  Allergen Reactions   Zithromax [Azithromycin] Shortness Of Breath and Itching    TOTAL BODY ITCHING [EVEN SOLES OF FEET] WHEEZING    Other     z -pack   Psyllium Nausea And Vomiting    Metamucil     FAMILY HISTORY: Family History  Problem Relation Age of Onset   Arthritis Mother    Hypertension Mother    Diabetes Mother    High blood pressure Mother    Cancer Father        Lung   Colon cancer Neg Hx    Colon polyps Neg Hx    Esophageal cancer Neg Hx    Rectal cancer Neg Hx    Stomach cancer Neg Hx       Objective:  Blood pressure (!) 143/64, pulse (!) 112, height '5\' 7"'$  (1.702 m), weight 204 lb 6.4 oz (92.7 kg), SpO2 98 %. General: No acute distress.  Patient appears well-groomed.    Metta Clines, DO  CC: Marlowe Sax, NP

## 2022-04-11 ENCOUNTER — Ambulatory Visit: Payer: Medicare Other | Admitting: Neurology

## 2022-04-11 ENCOUNTER — Encounter: Payer: Self-pay | Admitting: Neurology

## 2022-04-11 VITALS — BP 143/64 | HR 112 | Ht 67.0 in | Wt 204.4 lb

## 2022-04-11 DIAGNOSIS — M47812 Spondylosis without myelopathy or radiculopathy, cervical region: Secondary | ICD-10-CM

## 2022-04-11 DIAGNOSIS — G4486 Cervicogenic headache: Secondary | ICD-10-CM

## 2022-04-11 DIAGNOSIS — G43019 Migraine without aura, intractable, without status migrainosus: Secondary | ICD-10-CM | POA: Diagnosis not present

## 2022-04-11 DIAGNOSIS — R251 Tremor, unspecified: Secondary | ICD-10-CM

## 2022-04-11 NOTE — Patient Instructions (Signed)
When you get a migraine, try Nurtec - 1 tablet daily as needed.  Let me know if effective. Otherwise, follow up 6 months.

## 2022-04-12 DIAGNOSIS — G4733 Obstructive sleep apnea (adult) (pediatric): Secondary | ICD-10-CM | POA: Diagnosis not present

## 2022-04-15 ENCOUNTER — Other Ambulatory Visit: Payer: Self-pay | Admitting: Family

## 2022-04-15 DIAGNOSIS — C50919 Malignant neoplasm of unspecified site of unspecified female breast: Secondary | ICD-10-CM

## 2022-04-15 NOTE — Telephone Encounter (Signed)
Pharmacy requested refill.  Epic LR: 11/15/2021 Contract Date: 05/28/2021  Added note to upcoming appointment to update.   Pended Rx and sent to [facility] to approve due to [place] out of office.

## 2022-04-22 ENCOUNTER — Inpatient Hospital Stay: Payer: Medicare Other

## 2022-04-22 ENCOUNTER — Telehealth: Payer: Self-pay | Admitting: Cardiology

## 2022-04-22 ENCOUNTER — Telehealth: Payer: Self-pay | Admitting: *Deleted

## 2022-04-22 ENCOUNTER — Other Ambulatory Visit: Payer: Self-pay | Admitting: Hematology

## 2022-04-22 ENCOUNTER — Inpatient Hospital Stay: Payer: Medicare Other | Attending: Hematology

## 2022-04-22 VITALS — BP 147/78 | HR 88 | Temp 98.2°F | Resp 18

## 2022-04-22 VITALS — BP 152/76 | HR 92 | Temp 98.3°F | Resp 16 | Wt 201.9 lb

## 2022-04-22 DIAGNOSIS — R7989 Other specified abnormal findings of blood chemistry: Secondary | ICD-10-CM | POA: Diagnosis not present

## 2022-04-22 DIAGNOSIS — Z170 Estrogen receptor positive status [ER+]: Secondary | ICD-10-CM | POA: Insufficient documentation

## 2022-04-22 DIAGNOSIS — R0602 Shortness of breath: Secondary | ICD-10-CM | POA: Diagnosis not present

## 2022-04-22 DIAGNOSIS — H9202 Otalgia, left ear: Secondary | ICD-10-CM | POA: Diagnosis present

## 2022-04-22 DIAGNOSIS — I129 Hypertensive chronic kidney disease with stage 1 through stage 4 chronic kidney disease, or unspecified chronic kidney disease: Secondary | ICD-10-CM | POA: Diagnosis present

## 2022-04-22 DIAGNOSIS — Z8249 Family history of ischemic heart disease and other diseases of the circulatory system: Secondary | ICD-10-CM | POA: Diagnosis not present

## 2022-04-22 DIAGNOSIS — E876 Hypokalemia: Secondary | ICD-10-CM

## 2022-04-22 DIAGNOSIS — E785 Hyperlipidemia, unspecified: Secondary | ICD-10-CM | POA: Diagnosis present

## 2022-04-22 DIAGNOSIS — E669 Obesity, unspecified: Secondary | ICD-10-CM | POA: Diagnosis present

## 2022-04-22 DIAGNOSIS — K219 Gastro-esophageal reflux disease without esophagitis: Secondary | ICD-10-CM | POA: Diagnosis present

## 2022-04-22 DIAGNOSIS — H548 Legal blindness, as defined in USA: Secondary | ICD-10-CM | POA: Diagnosis present

## 2022-04-22 DIAGNOSIS — C50919 Malignant neoplasm of unspecified site of unspecified female breast: Secondary | ICD-10-CM

## 2022-04-22 DIAGNOSIS — F419 Anxiety disorder, unspecified: Secondary | ICD-10-CM | POA: Diagnosis present

## 2022-04-22 DIAGNOSIS — Z6830 Body mass index (BMI) 30.0-30.9, adult: Secondary | ICD-10-CM

## 2022-04-22 DIAGNOSIS — Z743 Need for continuous supervision: Secondary | ICD-10-CM | POA: Diagnosis not present

## 2022-04-22 DIAGNOSIS — Z1152 Encounter for screening for COVID-19: Secondary | ICD-10-CM

## 2022-04-22 DIAGNOSIS — N1832 Chronic kidney disease, stage 3b: Secondary | ICD-10-CM | POA: Diagnosis not present

## 2022-04-22 DIAGNOSIS — I951 Orthostatic hypotension: Secondary | ICD-10-CM | POA: Diagnosis not present

## 2022-04-22 DIAGNOSIS — Z8261 Family history of arthritis: Secondary | ICD-10-CM | POA: Diagnosis not present

## 2022-04-22 DIAGNOSIS — R6889 Other general symptoms and signs: Secondary | ICD-10-CM | POA: Diagnosis not present

## 2022-04-22 DIAGNOSIS — Z853 Personal history of malignant neoplasm of breast: Secondary | ICD-10-CM

## 2022-04-22 DIAGNOSIS — R1013 Epigastric pain: Secondary | ICD-10-CM | POA: Insufficient documentation

## 2022-04-22 DIAGNOSIS — Z801 Family history of malignant neoplasm of trachea, bronchus and lung: Secondary | ICD-10-CM | POA: Diagnosis not present

## 2022-04-22 DIAGNOSIS — Z888 Allergy status to other drugs, medicaments and biological substances status: Secondary | ICD-10-CM | POA: Diagnosis not present

## 2022-04-22 DIAGNOSIS — Z87891 Personal history of nicotine dependence: Secondary | ICD-10-CM

## 2022-04-22 DIAGNOSIS — I2510 Atherosclerotic heart disease of native coronary artery without angina pectoris: Secondary | ICD-10-CM | POA: Diagnosis present

## 2022-04-22 DIAGNOSIS — Z5111 Encounter for antineoplastic chemotherapy: Secondary | ICD-10-CM | POA: Insufficient documentation

## 2022-04-22 DIAGNOSIS — Z833 Family history of diabetes mellitus: Secondary | ICD-10-CM | POA: Diagnosis not present

## 2022-04-22 DIAGNOSIS — C50511 Malignant neoplasm of lower-outer quadrant of right female breast: Secondary | ICD-10-CM | POA: Insufficient documentation

## 2022-04-22 DIAGNOSIS — K760 Fatty (change of) liver, not elsewhere classified: Secondary | ICD-10-CM | POA: Diagnosis not present

## 2022-04-22 DIAGNOSIS — R531 Weakness: Secondary | ICD-10-CM | POA: Diagnosis present

## 2022-04-22 DIAGNOSIS — R0600 Dyspnea, unspecified: Secondary | ICD-10-CM | POA: Diagnosis not present

## 2022-04-22 DIAGNOSIS — H0520 Unspecified exophthalmos: Secondary | ICD-10-CM | POA: Diagnosis present

## 2022-04-22 LAB — CMP (CANCER CENTER ONLY)
ALT: 29 U/L (ref 0–44)
AST: 42 U/L — ABNORMAL HIGH (ref 15–41)
Albumin: 3.5 g/dL (ref 3.5–5.0)
Alkaline Phosphatase: 177 U/L — ABNORMAL HIGH (ref 38–126)
Anion gap: 6 (ref 5–15)
BUN: 13 mg/dL (ref 6–20)
CO2: 44 mmol/L — ABNORMAL HIGH (ref 22–32)
Calcium: 8.9 mg/dL (ref 8.9–10.3)
Chloride: 95 mmol/L — ABNORMAL LOW (ref 98–111)
Creatinine: 1.51 mg/dL — ABNORMAL HIGH (ref 0.44–1.00)
GFR, Estimated: 40 mL/min — ABNORMAL LOW (ref >60)
Glucose, Bld: 152 mg/dL — ABNORMAL HIGH (ref 70–99)
Potassium: 2.5 mmol/L — CL (ref 3.5–5.1)
Sodium: 145 mmol/L (ref 135–145)
Total Bilirubin: 0.8 mg/dL (ref 0.3–1.2)
Total Protein: 6.9 g/dL (ref 6.5–8.1)

## 2022-04-22 LAB — CBC WITH DIFFERENTIAL (CANCER CENTER ONLY)
Abs Immature Granulocytes: 0.01 10*3/uL (ref 0.00–0.07)
Basophils Absolute: 0 10*3/uL (ref 0.0–0.1)
Basophils Relative: 0 %
Eosinophils Absolute: 0 10*3/uL (ref 0.0–0.5)
Eosinophils Relative: 1 %
HCT: 32.8 % — ABNORMAL LOW (ref 36.0–46.0)
Hemoglobin: 11 g/dL — ABNORMAL LOW (ref 12.0–15.0)
Immature Granulocytes: 0 %
Lymphocytes Relative: 57 %
Lymphs Abs: 2.9 10*3/uL (ref 0.7–4.0)
MCH: 35.4 pg — ABNORMAL HIGH (ref 26.0–34.0)
MCHC: 33.5 g/dL (ref 30.0–36.0)
MCV: 105.5 fL — ABNORMAL HIGH (ref 80.0–100.0)
Monocytes Absolute: 0.3 10*3/uL (ref 0.1–1.0)
Monocytes Relative: 5 %
Neutro Abs: 1.9 10*3/uL (ref 1.7–7.7)
Neutrophils Relative %: 37 %
Platelet Count: 195 10*3/uL (ref 150–400)
RBC: 3.11 MIL/uL — ABNORMAL LOW (ref 3.87–5.11)
RDW: 15.8 % — ABNORMAL HIGH (ref 11.5–15.5)
WBC Count: 5.1 10*3/uL (ref 4.0–10.5)
nRBC: 0 % (ref 0.0–0.2)

## 2022-04-22 MED ORDER — FAMOTIDINE IN NACL 20-0.9 MG/50ML-% IV SOLN
20.0000 mg | Freq: Once | INTRAVENOUS | Status: AC
  Administered 2022-04-22: 20 mg via INTRAVENOUS
  Filled 2022-04-22: qty 50

## 2022-04-22 MED ORDER — POTASSIUM CHLORIDE CRYS ER 20 MEQ PO TBCR
40.0000 meq | EXTENDED_RELEASE_TABLET | Freq: Once | ORAL | Status: AC
  Administered 2022-04-22: 40 meq via ORAL
  Filled 2022-04-22: qty 2

## 2022-04-22 MED ORDER — FAMOTIDINE 20 MG PO TABS
20.0000 mg | ORAL_TABLET | Freq: Once | ORAL | Status: AC
  Administered 2022-04-22: 20 mg via ORAL
  Filled 2022-04-22: qty 1

## 2022-04-22 MED ORDER — POTASSIUM CHLORIDE 10 MEQ/100ML IV SOLN
10.0000 meq | INTRAVENOUS | Status: AC
  Administered 2022-04-22 (×4): 10 meq via INTRAVENOUS
  Filled 2022-04-22 (×4): qty 100

## 2022-04-22 MED ORDER — FULVESTRANT 250 MG/5ML IM SOSY
500.0000 mg | PREFILLED_SYRINGE | Freq: Once | INTRAMUSCULAR | Status: AC
  Administered 2022-04-22: 500 mg via INTRAMUSCULAR
  Filled 2022-04-22: qty 10

## 2022-04-22 NOTE — Telephone Encounter (Signed)
CRITICAL VALUE STICKER  CRITICAL VALUE: K 2.5  RECEIVER (on-site recipient of call): Velna Ochs RN  DATE & TIME NOTIFIED: 04/22/22 @ [phone]  MESSENGER (representative from lab):Pam  MD NOTIFIED: Dr Irene Limbo  TIME OF NOTIFICATION: 0905  RESPONSE:  MD aware

## 2022-04-22 NOTE — Progress Notes (Signed)
Around 1330, patient complained of epigastric pain which she attributes to heartburn. Per patient, this is a common experience for her and it sometimes can cause severe discomfort. She states she ate a sausage biscuit today and foods that are greasy exacerbate her heartburn. Vitals assessed and were found to be stable. Patient given '20mg'$  pepcid PO per verbal order or Vilinda Boehringer, PA-C. This caused some relief, but patient still reported discomfort. Another '20mg'$  pepcid IV given and patient reported pain subsided. Verbal order for EKG obtained from Lisabeth Devoid., PA-C and result was normal sinus rhythm- EKG reviewed by Lisabeth Devoid. Patient tolerated remainder of potassium infusion well with no further complaints. Vitals stable and patient in no distress upon leaving [facility].

## 2022-04-22 NOTE — Patient Instructions (Signed)
Potassium Acetate Injection What is this medication? POTASSIUM ACETATE (poe TASS i um ASa tate) prevents and treats low levels of potassium in your body. Potassium plays an important role in maintaining the health of your kidneys, heart, muscles, and nervous system. This medicine may be used for other purposes; ask your health care provider or pharmacist if you have questions. What should I tell my care team before I take this medication? They need to know if you have any of these conditions: Addison's disease Dehydration Diabetes Heart disease High levels of potassium in the blood Irregular heartbeat Kidney disease Liver disease Recent severe burn An unusual or allergic reaction to potassium, other medications, foods, dyes, or preservatives Pregnant or trying to get pregnant Breast-feeding How should I use this medication? This medication is for infusion into a vein. It is given in a hospital or clinic setting. Talk to your care team about the use of this medication in children. Special care may be needed. Overdosage: If you think you have taken too much of this medicine contact a poison control center or emergency room at once. NOTE: This medicine is only for you. Do not share this medicine with others. What if I miss a dose? This does not apply. What may interact with this medication? Do not take this medication with any of the following: Certain diuretics such as spironolactone, triamterene Eplerenone Sodium polystyrene sulfonate This medication may also interact with the following: Certain medications for blood pressure or heart disease like lisinopril, losartan, quinapril, valsartan Medications that lower your chance of fighting infection such as cyclosporine, tacrolimus NSAIDs, medications for pain and inflammation, like ibuprofen or naproxen Other potassium supplements Salt substitutes This list may not describe all possible interactions. Give your health care provider a  list of all the medicines, herbs, non-prescription drugs, or dietary supplements you use. Also tell them if you smoke, drink alcohol, or use illegal drugs. Some items may interact with your medicine. What should I watch for while using this medication? Your condition will be monitored carefully while you are receiving this medication. You may need blood work done while you are taking this medication. What side effects may I notice from receiving this medication? Side effects that you should report to your care team as soon as possible: Allergic reactions--skin rash, itching, hives, swelling of the face, lips, tongue, or throat High potassium level--muscle weakness, fast or irregular heartbeat Side effects that usually do not require medical attention (report these to your care team if they continue or are bothersome): Pain, redness, or irritation at injection site This list may not describe all possible side effects. Call your doctor for medical advice about side effects. You may report side effects to FDA at 1-800-FDA-1088. Where should I keep my medication? This medication is given in a hospital or clinic and will not be stored at home. NOTE: This sheet is a summary. It may not cover all possible information. If you have questions about this medicine, talk to your doctor, pharmacist, or health care provider.  2023 Elsevier/Gold Standard (2021-01-28 00:00:00)  

## 2022-04-22 NOTE — Progress Notes (Signed)
Per Dr Irene Limbo : Dr Terrial Rhodes office notified of pt's low K and low K trend. Dr Irene Limbo wanted Dr Harriet Masson to be made aware since she has prescribed Florinef for pt . Office took message and will let MD know.

## 2022-04-22 NOTE — Telephone Encounter (Signed)
Patient returned call

## 2022-04-22 NOTE — Telephone Encounter (Addendum)
LMTCB. Daughter called clinic and stated patient is in the ED receiving K+ per IV. Recommended to daughter that patient add her to the [facility]. Daughter asked if there is any medication the patient may need to take. I referred her to the AVS patient will receive, and that I am forwarding this message to Dr. Harriet Masson.

## 2022-04-22 NOTE — Telephone Encounter (Addendum)
Patient's and spouse's phones do not accept messages. Spoke with daughter who said she is on the way to parents' home. I will call back in about 5 minutes. Called daughter back who stated patient told her she was going to the ER because of potassium. Daughter stated she would verify that patient is at ER and notify clinic.

## 2022-04-22 NOTE — Telephone Encounter (Signed)
Pt c/o medication issue:  1. Name of Medication:  fludrocortisone (FLORINEF) 0.1 MG tablet  2. How are you currently taking this medication (dosage and times per day)?   3. Are you having a reaction (difficulty breathing--STAT)?   4. What is your medication issue?  Patient was seen today by Dr. Marlena Clipper [facility]. Beth, Dr. Grier Mitts nurse,  reports patient's potassium has been low the last several times they saw the patient. Today potassium was at 2.5--it has been 2.5 in the past. Per Beth, Dr. Irene Limbo assumes Florinef may be the cause of this issue, assumes may need decrease. Beth states patient will be leaving their office soon. She requested a call be returned to the patient with any advisement and if questions a call may be returned to her at [phone].

## 2022-04-24 ENCOUNTER — Inpatient Hospital Stay (HOSPITAL_COMMUNITY)
Admission: EM | Admit: 2022-04-24 | Discharge: 2022-04-26 | DRG: 641 | Disposition: A | Discharge status: Home / Self Care | Payer: Medicare Other | Attending: Internal Medicine | Admitting: Internal Medicine

## 2022-04-24 ENCOUNTER — Other Ambulatory Visit: Payer: Self-pay

## 2022-04-24 ENCOUNTER — Emergency Department (HOSPITAL_COMMUNITY): Payer: Medicare Other

## 2022-04-24 ENCOUNTER — Encounter (HOSPITAL_COMMUNITY): Payer: Self-pay | Admitting: Emergency Medicine

## 2022-04-24 DIAGNOSIS — Z6830 Body mass index (BMI) 30.0-30.9, adult: Secondary | ICD-10-CM

## 2022-04-24 DIAGNOSIS — I951 Orthostatic hypotension: Secondary | ICD-10-CM | POA: Diagnosis present

## 2022-04-24 DIAGNOSIS — R0600 Dyspnea, unspecified: Secondary | ICD-10-CM | POA: Diagnosis present

## 2022-04-24 DIAGNOSIS — Z853 Personal history of malignant neoplasm of breast: Secondary | ICD-10-CM

## 2022-04-24 DIAGNOSIS — E876 Hypokalemia: Secondary | ICD-10-CM | POA: Diagnosis not present

## 2022-04-24 DIAGNOSIS — Z8249 Family history of ischemic heart disease and other diseases of the circulatory system: Secondary | ICD-10-CM

## 2022-04-24 DIAGNOSIS — I2510 Atherosclerotic heart disease of native coronary artery without angina pectoris: Secondary | ICD-10-CM | POA: Diagnosis present

## 2022-04-24 DIAGNOSIS — K219 Gastro-esophageal reflux disease without esophagitis: Secondary | ICD-10-CM | POA: Diagnosis present

## 2022-04-24 DIAGNOSIS — Z801 Family history of malignant neoplasm of trachea, bronchus and lung: Secondary | ICD-10-CM

## 2022-04-24 DIAGNOSIS — Z8261 Family history of arthritis: Secondary | ICD-10-CM

## 2022-04-24 DIAGNOSIS — C50919 Malignant neoplasm of unspecified site of unspecified female breast: Secondary | ICD-10-CM | POA: Diagnosis present

## 2022-04-24 DIAGNOSIS — R7989 Other specified abnormal findings of blood chemistry: Secondary | ICD-10-CM | POA: Diagnosis present

## 2022-04-24 DIAGNOSIS — E785 Hyperlipidemia, unspecified: Secondary | ICD-10-CM | POA: Diagnosis present

## 2022-04-24 DIAGNOSIS — Z833 Family history of diabetes mellitus: Secondary | ICD-10-CM

## 2022-04-24 DIAGNOSIS — H0520 Unspecified exophthalmos: Secondary | ICD-10-CM | POA: Diagnosis present

## 2022-04-24 DIAGNOSIS — H9202 Otalgia, left ear: Secondary | ICD-10-CM | POA: Diagnosis present

## 2022-04-24 DIAGNOSIS — Z888 Allergy status to other drugs, medicaments and biological substances status: Secondary | ICD-10-CM

## 2022-04-24 DIAGNOSIS — R0602 Shortness of breath: Secondary | ICD-10-CM | POA: Diagnosis present

## 2022-04-24 DIAGNOSIS — Z87891 Personal history of nicotine dependence: Secondary | ICD-10-CM

## 2022-04-24 DIAGNOSIS — K760 Fatty (change of) liver, not elsewhere classified: Secondary | ICD-10-CM | POA: Diagnosis present

## 2022-04-24 DIAGNOSIS — H548 Legal blindness, as defined in USA: Secondary | ICD-10-CM | POA: Diagnosis present

## 2022-04-24 DIAGNOSIS — Z1152 Encounter for screening for COVID-19: Secondary | ICD-10-CM

## 2022-04-24 DIAGNOSIS — F419 Anxiety disorder, unspecified: Secondary | ICD-10-CM | POA: Diagnosis present

## 2022-04-24 DIAGNOSIS — I129 Hypertensive chronic kidney disease with stage 1 through stage 4 chronic kidney disease, or unspecified chronic kidney disease: Secondary | ICD-10-CM | POA: Diagnosis present

## 2022-04-24 DIAGNOSIS — R531 Weakness: Secondary | ICD-10-CM | POA: Diagnosis present

## 2022-04-24 DIAGNOSIS — N1832 Chronic kidney disease, stage 3b: Secondary | ICD-10-CM | POA: Diagnosis present

## 2022-04-24 DIAGNOSIS — E669 Obesity, unspecified: Secondary | ICD-10-CM | POA: Diagnosis present

## 2022-04-24 HISTORY — DX: Hyperlipidemia, unspecified: E78.5

## 2022-04-24 LAB — COMPREHENSIVE METABOLIC PANEL
ALT: 29 U/L (ref 0–44)
AST: 46 U/L — ABNORMAL HIGH (ref 15–41)
Albumin: 3 g/dL — ABNORMAL LOW (ref 3.5–5.0)
Alkaline Phosphatase: 129 U/L — ABNORMAL HIGH (ref 38–126)
Anion gap: 12 (ref 5–15)
BUN: 10 mg/dL (ref 6–20)
CO2: 37 mmol/L — ABNORMAL HIGH (ref 22–32)
Calcium: 8 mg/dL — ABNORMAL LOW (ref 8.9–10.3)
Chloride: 91 mmol/L — ABNORMAL LOW (ref 98–111)
Creatinine, Ser: 1.54 mg/dL — ABNORMAL HIGH (ref 0.44–1.00)
GFR, Estimated: 39 mL/min — ABNORMAL LOW (ref >60)
Glucose, Bld: 118 mg/dL — ABNORMAL HIGH (ref 70–99)
Potassium: 2 mmol/L — CL (ref 3.5–5.1)
Sodium: 140 mmol/L (ref 135–145)
Total Bilirubin: 1.8 mg/dL — ABNORMAL HIGH (ref 0.3–1.2)
Total Protein: 7.3 g/dL (ref 6.5–8.1)

## 2022-04-24 LAB — CBC
HCT: 38.7 % (ref 36.0–46.0)
Hemoglobin: 13.1 g/dL (ref 12.0–15.0)
MCH: 35.4 pg — ABNORMAL HIGH (ref 26.0–34.0)
MCHC: 33.9 g/dL (ref 30.0–36.0)
MCV: 104.6 fL — ABNORMAL HIGH (ref 80.0–100.0)
Platelets: 222 10*3/uL (ref 150–400)
RBC: 3.7 MIL/uL — ABNORMAL LOW (ref 3.87–5.11)
RDW: 15.7 % — ABNORMAL HIGH (ref 11.5–15.5)
WBC: 7.7 10*3/uL (ref 4.0–10.5)
nRBC: 0 % (ref 0.0–0.2)

## 2022-04-24 LAB — PHOSPHORUS: Phosphorus: 1.9 mg/dL — ABNORMAL LOW (ref 2.5–4.6)

## 2022-04-24 LAB — RESP PANEL BY RT-PCR (FLU A&B, COVID) ARPGX2
Influenza A by PCR: NEGATIVE
Influenza B by PCR: NEGATIVE
SARS Coronavirus 2 by RT PCR: NEGATIVE

## 2022-04-24 LAB — D-DIMER, QUANTITATIVE: D-Dimer, Quant: 2.45 ug/mL-FEU — ABNORMAL HIGH (ref 0.00–0.50)

## 2022-04-24 LAB — I-STAT BETA HCG BLOOD, ED (MC, WL, AP ONLY): I-stat hCG, quantitative: 17.7 m[IU]/mL — ABNORMAL HIGH (ref <5)

## 2022-04-24 LAB — MAGNESIUM: Magnesium: 1.3 mg/dL — ABNORMAL LOW (ref 1.7–2.4)

## 2022-04-24 MED ORDER — ACETAMINOPHEN 650 MG RE SUPP: 650.0000 mg | Freq: Four times a day (QID) | RECTAL | Status: DC | PRN

## 2022-04-24 MED ORDER — POTASSIUM CHLORIDE IN NACL 40-0.9 MEQ/L-% IV SOLN
INTRAVENOUS | Status: DC
  Filled 2022-04-24 (×3): qty 1000

## 2022-04-24 MED ORDER — POTASSIUM CHLORIDE CRYS ER 20 MEQ PO TBCR
40.0000 meq | EXTENDED_RELEASE_TABLET | Freq: Once | ORAL | Status: AC
  Administered 2022-04-24: 40 meq via ORAL
  Filled 2022-04-24: qty 2

## 2022-04-24 MED ORDER — ALPRAZOLAM 0.5 MG PO TABS
1.0000 mg | ORAL_TABLET | Freq: Every day | ORAL | Status: DC
  Administered 2022-04-24 – 2022-04-25 (×2): 1 mg via ORAL
  Filled 2022-04-24 (×2): qty 2

## 2022-04-24 MED ORDER — MIDODRINE HCL 5 MG PO TABS
10.0000 mg | ORAL_TABLET | Freq: Three times a day (TID) | ORAL | Status: DC
  Administered 2022-04-25 – 2022-04-26 (×4): 10 mg via ORAL
  Filled 2022-04-24 (×5): qty 2

## 2022-04-24 MED ORDER — PROCHLORPERAZINE EDISYLATE 10 MG/2ML IJ SOLN: 10.0000 mg | Freq: Four times a day (QID) | INTRAMUSCULAR | Status: DC | PRN

## 2022-04-24 MED ORDER — ACETAMINOPHEN 325 MG PO TABS
650.0000 mg | ORAL_TABLET | Freq: Four times a day (QID) | ORAL | Status: DC | PRN
  Administered 2022-04-25 (×2): 650 mg via ORAL
  Filled 2022-04-24 (×2): qty 2

## 2022-04-24 MED ORDER — ABEMACICLIB 100 MG PO TABS: 100.0000 mg | ORAL_TABLET | Freq: Two times a day (BID) | ORAL | Status: DC

## 2022-04-24 MED ORDER — POTASSIUM CHLORIDE 10 MEQ/100ML IV SOLN
10.0000 meq | INTRAVENOUS | Status: AC
  Administered 2022-04-24 (×5): 10 meq via INTRAVENOUS
  Filled 2022-04-24 (×5): qty 100

## 2022-04-24 MED ORDER — FLUDROCORTISONE ACETATE 0.1 MG PO TABS
0.1000 mg | ORAL_TABLET | Freq: Three times a day (TID) | ORAL | Status: DC
  Administered 2022-04-24 – 2022-04-25 (×2): 0.1 mg via ORAL
  Filled 2022-04-24 (×2): qty 1

## 2022-04-24 MED ORDER — MAGNESIUM SULFATE 2 GM/50ML IV SOLN
2.0000 g | Freq: Once | INTRAVENOUS | Status: AC
  Administered 2022-04-25: 2 g via INTRAVENOUS
  Filled 2022-04-24: qty 50

## 2022-04-24 MED ORDER — K PHOS MONO-SOD PHOS DI & MONO 155-852-130 MG PO TABS
500.0000 mg | ORAL_TABLET | Freq: Four times a day (QID) | ORAL | Status: AC
  Administered 2022-04-24 – 2022-04-25 (×4): 500 mg via ORAL
  Filled 2022-04-24 (×4): qty 2

## 2022-04-24 MED ORDER — OMEPRAZOLE MAGNESIUM 20 MG PO TBEC: 20.0000 mg | DELAYED_RELEASE_TABLET | Freq: Every day | ORAL | Status: DC

## 2022-04-24 MED ORDER — ALBUTEROL SULFATE (2.5 MG/3ML) 0.083% IN NEBU: 2.5000 mg | INHALATION_SOLUTION | Freq: Four times a day (QID) | RESPIRATORY_TRACT | Status: DC | PRN

## 2022-04-24 MED ORDER — PANTOPRAZOLE SODIUM 40 MG PO TBEC
40.0000 mg | DELAYED_RELEASE_TABLET | Freq: Every day | ORAL | Status: DC
  Administered 2022-04-25 – 2022-04-26 (×2): 40 mg via ORAL
  Filled 2022-04-24 (×2): qty 1

## 2022-04-24 MED ORDER — LIFITEGRAST 5 % OP SOLN: 1.0000 [drp] | Freq: Four times a day (QID) | OPHTHALMIC | Status: DC

## 2022-04-24 MED ORDER — METHOCARBAMOL 500 MG PO TABS: 500.0000 mg | ORAL_TABLET | Freq: Four times a day (QID) | ORAL | Status: DC | PRN

## 2022-04-24 MED ORDER — AMITRIPTYLINE HCL 25 MG PO TABS
75.0000 mg | ORAL_TABLET | Freq: Every day | ORAL | Status: DC
  Administered 2022-04-24 – 2022-04-25 (×2): 75 mg via ORAL
  Filled 2022-04-24 (×2): qty 3

## 2022-04-24 NOTE — H&P (Signed)
History and Physical    Patient: Brandy [name] [facility] HAL:[id number] DOB: 11/20/1965 DOA: 04/24/2022 DOS: the patient was seen and examined on 04/24/2022 PCP: Ngetich, Nelda Bucks, NP  Patient coming from: Home  Chief Complaint:  Chief Complaint  Patient presents with   Shortness of Breath   HPI: Brandy [name] is a 56 y.o. female with the past medical history of pansinusitis, anxiety, osteoarthritis, atrial septal defect, cancer, cataract, dyspnea, GERD, history of hiatal hernia, legally blind in the right eye, lumbar herniated disc, sciatica, class I obesity, breast cancer, stage III CKD, daytime somnolence, fatty liver, hyperlipidemia, hypertension, peripheral neuropathy, left eye proptosis, restrictive lung disease, history of hypokalemia who is coming to the due to dyspnea on exertion since yesterday, bilateral lower extremity cramps that happen when her potassium level is decreased.  No fever, chills or night sweats. No sore throat, rhinorrhea, dyspnea, wheezing or hemoptysis.  No chest pain, palpitations, diaphoresis, PND, orthopnea or recent pitting edema of the lower extremities.  No appetite changes, abdominal pain, diarrhea, constipation, melena or hematochezia.  No flank pain, dysuria, frequency or hematuria.  No polyuria, polydipsia, polyphagia.  ED course: Initial vital signs were temperature 97.9 F, pulse 98, respiration 20, BP 101/75 mmHg O2 sat 99% on room air.  The patient received 40 mEq of KCl p.o. and 6 doses of KCl 10 mEq IVPB every hour.  Lab work: Her CBC is her white count 7.7, hemoglobin 13.1 g/dL with an MCV 104.6 fL and platelets 222.  D-dimer was 2.45.  Coronavirus influenza PCR was negative.  Phosphorus 1.9, magnesium 1.3, glucose 118, BUN 10, creatinine 1.54, calcium 8.0 and total bilirubin 1.8 mg/dL.  Sodium 140, potassium 2.0, chloride 91 and CO2 37 mmol/L with an anion gap of 12.  Imaging: Two-view chest radiograph with no acute cardiopulmonary  process.   Review of Systems: As mentioned in the history of present illness. All other systems reviewed and are negative. Past Medical History:  Diagnosis Date   Acute pansinusitis 08/02/2017   Anxiety    Arthritis    ASD (atrial septal defect)    s/p closure with Amplatzer device 10/05/04 (Dr. Myriam Jacobson, [place]) 10/05/04   Cancer ([place])    Cataract    Dyspnea    GERD (gastroesophageal reflux disease)    Heart murmur    no longer heard   History of hiatal hernia    Legally blind in right eye, as defined in [place]    Lumbar herniated disc    PONV (postoperative nausea and vomiting)    Sciatica    Past Surgical History:  Procedure Laterality Date   ABLATION     BREAST LUMPECTOMY WITH RADIOACTIVE SEED AND SENTINEL LYMPH NODE BIOPSY Right 11/23/2020   Procedure: RIGHT BREAST LUMPECTOMY WITH RADIOACTIVE SEED AND RIGHT AXILLARY SENTINEL LYMPH NODE BIOPSY;  Surgeon: Rolm Bookbinder, MD;  Location: [place];  Service: General;  Laterality: Right;   BREAST SURGERY Bilateral 2011   Breast Reduction Surgery   BUNIONECTOMY     CARDIAC CATHETERIZATION     10/05/04 [facility]): LM < 25%, otherwise normal coronaries. No pulmonary HTN, Mildly enlarged RV. Secundum ASD s/p closure.   CARDIAC SURGERY     CATARACT EXTRACTION     CLEFT PALATE REPAIR     s/p cleft lip and palate repair   EYE SURGERY Right 2019   right eye removed   LUMBAR LAMINECTOMY/DECOMPRESSION MICRODISCECTOMY Left 05/23/2016   Procedure: LEFT L4-L5 LATERAL RECESS DECOMPRESSION WITH CENTRAL AND RIGHT  DECOMPRESSION VIA LEFT SIDE;  Surgeon: [name], MD;  Location: [place];  Service: Orthopedics;  Laterality: Left;   LUMBAR LAMINECTOMY/DECOMPRESSION MICRODISCECTOMY Left 05/23/2016   Procedure: LUMBAR LAMINECTOMY/DECOMPRESSION MICRODISCECTOMY Lumbar five - Sacral One 1 LEVEL;  Surgeon: [name], MD;  Location: [place];  Service: Orthopedics;  Laterality: Left;   REDUCTION MAMMAPLASTY  2011   SHOULDER INJECTION Left 05/23/2016    Procedure: SHOULDER INJECTION;  Surgeon: [name], MD;  Location: [place];  Service: Orthopedics;  Laterality: Left;  band-aid per pa-c   TRANSTHORACIC ECHOCARDIOGRAM     12/15/05 [facility]): Mild LVH, EF > 44%, grade 1 diastolic dysfunction, Trivial MR/PR/TR.   TUBAL LIGATION     Social History:  reports that she quit smoking about 4 years ago. Her smoking use included cigarettes. She has a 6.25 pack-year smoking history. She has never used smokeless tobacco. She reports that she does not drink alcohol and does not use drugs.  Allergies  Allergen Reactions   Zithromax [Azithromycin] Shortness Of Breath and Itching    TOTAL BODY ITCHING [EVEN SOLES OF FEET] WHEEZING    Other     z -pack   Psyllium Nausea And Vomiting    Metamucil     Family History  Problem Relation Age of Onset   Arthritis Mother    Hypertension Mother    Diabetes Mother    High blood pressure Mother    Cancer Father        Lung   Colon cancer Neg Hx    Colon polyps Neg Hx    Esophageal cancer Neg Hx    Rectal cancer Neg Hx    Stomach cancer Neg Hx     Prior to Admission medications   Medication Sig Start Date End Date Taking? Authorizing Provider  albuterol (PROVENTIL) (2.5 MG/3ML) 0.083% nebulizer solution Take 3 mLs (2.5 mg total) by nebulization every 6 (six) hours as needed for wheezing or shortness of breath. 04/19/21   Margaretha Seeds, MD  ALPRAZolam [name]) 1 MG tablet TAKE 1 TABLET BY MOUTH AT  BEDTIME 04/17/22   Medina-Vargas, Monina C, NP  amitriptyline (ELAVIL) 75 MG tablet Take 75 mg by mouth at bedtime.    [provider]  Carboxymethylcellul-Glycerin (LUBRICATING EYE DROPS OP) Place 1 drop into the left eye 4 (four) times daily.    [provider]  diphenoxylate-atropine (LOMOTIL) 2.5-0.025 MG tablet Take 1 tablet by mouth 3 (three) times daily as needed for diarrhea or loose stools. 04/20/21   Brunetta Genera, MD  fludrocortisone (FLORINEF) 0.1 MG tablet Take 1  tablet (0.1 mg total) by mouth 3 (three) times daily. 01/13/22   Tobb, Kardie, DO  fluticasone-salmeterol (WIXELA INHUB) 250-50 MCG/ACT AEPB Inhale 1 puff into the lungs in the morning and at bedtime. 11/26/21   Margaretha Seeds, MD  ketoconazole (NIZORAL) 2 % cream SMARTSIG:1 Application Topical 1 to 2 Times Daily 11/07/21   [provider]  ketorolac (ACULAR) 0.5 % ophthalmic solution Place 1 drop into the left eye 3 (three) times daily. 03/04/22   [provider]  loperamide (IMODIUM) 2 MG capsule Take 1-2 capsules (2-4 mg total) by mouth 4 (four) times daily as needed for diarrhea or loose stools. 05/10/21   Brunetta Genera, MD  methocarbamol (ROBAXIN) 500 MG tablet Take 1 tablet (500 mg total) by mouth every 6 (six) hours as needed for muscle spasms. 01/10/22   [name], MD  midodrine (PROAMATINE) 10 MG tablet  Take 1 tablet (10 mg total) by mouth 3 (three) times daily. 11/04/21   Tobb, Kardie, DO  Multiple Vitamin (MULTIVITAMIN WITH MINERALS) TABS tablet Take 1 tablet by mouth daily.    [provider]  omeprazole (PRILOSEC OTC) 20 MG tablet Take 20 mg by mouth daily.    [provider]  ondansetron (ZOFRAN) 8 MG tablet TAKE 1 TABLET(8 MG) BY MOUTH EVERY 8 HOURS AS NEEDED FOR NAUSEA OR VOMITING 04/19/21   Brunetta Genera, MD  rosuvastatin (CRESTOR) 10 MG tablet TAKE 1 TABLET BY MOUTH DAILY 01/21/22   Ngetich, Dinah C, NP  triamcinolone cream (KENALOG) 0.1 % SMARTSIG:1 Application Topical 2-3 Times Daily 11/07/21   [provider]  VERZENIO 100 MG tablet TAKE 1 TABLET BY MOUTH TWICE DAILY 02/14/22   Brunetta Genera, MD  Vitamin D, Ergocalciferol, (DRISDOL) 1.25 MG (50000 UNIT) CAPS capsule TAKE 1 CAPSULE ONCE PER WEEK 05/04/21   Brunetta Genera, MD  White Petrolatum-Mineral Oil (LUBRICANT EYE NIGHTTIME) OINT Place 1 drop into the left eye at bedtime.    [provider]  XIIDRA 5 % SOLN Place 1 drop into the left eye in the morning,  at noon, in the evening, and at bedtime. 03/03/22   [provider]    Physical Exam: Vitals:   04/24/22 1527 04/24/22 1630  BP: 101/75 108/65  Pulse: 98 85  Resp: 20 17  Temp: 97.9 F (36.6 C)   TempSrc: Oral   SpO2: 99% 99%   Physical Exam Constitutional:      General: She is awake. She is not in acute distress.    Appearance: She is obese.  HENT:     Head: Normocephalic.     Nose: No rhinorrhea.     Mouth/Throat:     Mouth: Mucous membranes are moist.  Eyes:     General: No scleral icterus.       Right eye: Discharge present.     Pupils: Pupils are equal, round, and reactive to light.     Comments: Left eye exophthalmos.  Neck:     Vascular: No JVD.  Cardiovascular:     Rate and Rhythm: Normal rate and regular rhythm.     Heart sounds: S1 normal and S2 normal.  Pulmonary:     Effort: Pulmonary effort is normal.     Breath sounds: Normal breath sounds. No wheezing, rhonchi or rales.  Abdominal:     General: Bowel sounds are normal.     Palpations: Abdomen is soft.     Tenderness: There is no abdominal tenderness.  Musculoskeletal:     Cervical back: Neck supple.     Right lower leg: No edema.     Left lower leg: No edema.  Skin:    General: Skin is warm and dry.  Neurological:     General: No focal deficit present.     Mental Status: She is alert and oriented to person, place, and time.  Psychiatric:        Mood and Affect: Mood normal.        Behavior: Behavior normal. Behavior is cooperative.   Data Reviewed:  Results are pending, will review when available.  06/03/2021 echocardiogram IMPRESSIONS:   1. Left ventricular ejection fraction, by estimation, is 60 to 65%. The left ventricle has normal function. The left ventricle has no regional wall motion abnormalities. Left ventricular diastolic parameters were normal.  2. Right ventricular systolic function is normal. The right ventricular size is normal.  3. The mitral valve is grossly  normal. No evidence of mitral valve regurgitation. No evidence of mitral stenosis.  4. The aortic valve is tricuspid. Aortic valve regurgitation is not visualized. No aortic stenosis is present.  Conclusion(s)/Recommendation(s): Normal biventricular function without evidence of hemodynamically significant valvular heart disease.   Assessment and Plan: Principal Problem:   Hypokalemia Without associated:   Hypomagnesemia and   Hypophosphatemia Is on fludrocortisone. Observation/telemetry. Continue potassium replacement. Magnesium sulfate 2 g IVPB. K-Phos 500 mg p.o. 4 times daily x4 doses. Recheck electrolytes in AM. IP or OP nephrology evaluation.  Active Problems:   Dyspnea No clear reason. Because elevated D-dimer. Has renal insufficiency with a GFR of 38. Check VQ scan.    Hyperbilirubinemia  History of fatty liver disease. Follow-up LFTs in the morning.    Orthostatic hypotension  Continue midodrine. Continue fludrocortisone.    Anxiety disorder Continue alprazolam as needed.    Breast cancer ([place]) Continue Verzenio. Follow-up with oncology as scheduled.    GERD (gastroesophageal reflux disease) Pantoprazole 40 mg p.o. daily.     Advance Care Planning:   Code Status: Full Code   Consults:   Family Communication:   Severity of Illness: The appropriate patient status for this patient is OBSERVATION. Observation status is judged to be reasonable and necessary in order to provide the required intensity of service to ensure the patient's safety. The patient's presenting symptoms, physical exam findings, and initial radiographic and laboratory data in the context of their medical condition is felt to place them at decreased risk for further clinical deterioration. Furthermore, it is anticipated that the patient will be medically stable for discharge from the hospital within 2 midnights of admission.   Author: Reubin Milan, MD 04/24/2022 5:03 PM  For  on call review [email or web address].   This document was prepared using Dragon voice recognition software and may contain some unintended transcription errors.

## 2022-04-24 NOTE — Plan of Care (Signed)

## 2022-04-24 NOTE — Progress Notes (Signed)
Connected pt to continuous bedside pulse ox monitor.

## 2022-04-24 NOTE — ED Triage Notes (Signed)
Pt BIB EMS from home, c/o SOB with exertion since yesterday evening. Reported frequent drops in K+ levels. Started on 14 day oral tablet replacement. Alert and oriented, hx of metastatic breast cancer.  BP 134/78 HR 102 sinus RR 22 spO2 98% 3 liters 20g IV LFA

## 2022-04-24 NOTE — ED Provider Notes (Signed)
[facility] DEPT Provider Note   CSN: [id number] Arrival date & time: 04/24/22  1456     History  Chief Complaint  Patient presents with   Shortness of [place] is a 56 y.o. female.  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  She is here via EMS.  History is obtained by those individuals and the patient herself.  Patient has a history of dyspnea, as well as ongoing difficulty with hypokalemia.  Since yesterday she has had dyspnea, at rest and with exertion, no substantial chest pain.  EMS reports the patient was at least transiently on 3 L via nasal cannula, to achieve saturation 98%.  Patient's cancer history is breast malignant with metastatic progression to right lung, she notes that she has been cancer free for some time.    Home Medications Prior to Admission medications   Medication Sig Start Date End Date Taking? Authorizing Provider  albuterol (PROVENTIL) (2.5 MG/3ML) 0.083% nebulizer solution Take 3 mLs (2.5 mg total) by nebulization every 6 (six) hours as needed for wheezing or shortness of breath. 04/19/21   Margaretha Seeds, MD  ALPRAZolam [name]) 1 MG tablet TAKE 1 TABLET BY MOUTH AT  BEDTIME 04/17/22   Medina-Vargas, Monina C, NP  amitriptyline (ELAVIL) 75 MG tablet Take 75 mg by mouth at bedtime.    [provider]  Carboxymethylcellul-Glycerin (LUBRICATING EYE DROPS OP) Place 1 drop into the left eye 4 (four) times daily.    [provider]  diphenoxylate-atropine (LOMOTIL) 2.5-0.025 MG tablet Take 1 tablet by mouth 3 (three) times daily as needed for diarrhea or loose stools. 04/20/21   Brunetta Genera, MD  fludrocortisone (FLORINEF) 0.1 MG tablet Take 1 tablet (0.1 mg total) by mouth 3 (three) times daily. 01/13/22   Tobb, Kardie, DO  fluticasone-salmeterol (WIXELA INHUB) 250-50 MCG/ACT AEPB Inhale 1 puff into the lungs in the morning and at bedtime. 11/26/21   Margaretha Seeds, MD  ketoconazole (NIZORAL) 2 % cream SMARTSIG:1 Application Topical 1 to 2 Times Daily 11/07/21   [provider]  ketorolac (ACULAR) 0.5 % ophthalmic solution Place 1 drop into the left eye 3 (three) times daily. 03/04/22   [provider]  loperamide (IMODIUM) 2 MG capsule Take 1-2 capsules (2-4 mg total) by mouth 4 (four) times daily as needed for diarrhea or loose stools. 05/10/21   Brunetta Genera, MD  methocarbamol (ROBAXIN) 500 MG tablet Take 1 tablet (500 mg total) by mouth every 6 (six) hours as needed for muscle spasms. 01/10/22   [name], MD  midodrine (PROAMATINE) 10 MG tablet Take 1 tablet (10 mg total) by mouth 3 (three) times daily. 11/04/21   Tobb, Kardie, DO  Multiple Vitamin (MULTIVITAMIN WITH MINERALS) TABS tablet Take 1 tablet by mouth daily.    [provider]  omeprazole (PRILOSEC OTC) 20 MG tablet Take 20 mg by mouth daily.    [provider]  ondansetron (ZOFRAN) 8 MG tablet TAKE 1 TABLET(8 MG) BY MOUTH EVERY 8 HOURS AS NEEDED FOR NAUSEA OR VOMITING 04/19/21   Brunetta Genera, MD  rosuvastatin (CRESTOR) 10 MG tablet TAKE 1 TABLET BY MOUTH DAILY 01/21/22   Ngetich, Dinah C, NP  triamcinolone cream (KENALOG) 0.1 % SMARTSIG:1 Application Topical 2-3 Times Daily 11/07/21   [provider]  VERZENIO 100 MG tablet TAKE 1 TABLET BY MOUTH TWICE DAILY 02/14/22   Brunetta Genera, MD  Vitamin D,  Ergocalciferol, (DRISDOL) 1.25 MG (50000 UNIT) CAPS capsule TAKE 1 CAPSULE ONCE PER WEEK 05/04/21   Brunetta Genera, MD  White Petrolatum-Mineral Oil (LUBRICANT EYE NIGHTTIME) OINT Place 1 drop into the left eye at bedtime.    [provider]  XIIDRA 5 % SOLN Place 1 drop into the left eye in the morning, at noon, in the evening, and at bedtime. 03/03/22   [provider]      Allergies    Zithromax [azithromycin], Other, and Psyllium    Review of Systems   Review of Systems  All other systems reviewed and  are negative.   Physical Exam Updated Vital Signs BP 114/62 (BP Location: Left Arm)   Pulse (!) 110   Temp 98.2 F (36.8 C) (Oral)   Resp 18   Ht '5\' 8"'$  (1.727 m)   Wt 89.7 kg   SpO2 91%   BMI 30.07 kg/m  Physical Exam Vitals and nursing note reviewed.  Constitutional:      General: She is not in acute distress.    Appearance: She is well-developed.  HENT:     Head: Normocephalic and atraumatic.  Eyes:     Conjunctiva/sclera: Conjunctivae normal.  Cardiovascular:     Rate and Rhythm: Normal rate and regular rhythm.  Pulmonary:     Effort: Pulmonary effort is normal. No respiratory distress.     Breath sounds: Normal breath sounds. No stridor. No wheezing.  Abdominal:     General: There is no distension.  Skin:    General: Skin is warm and dry.  Neurological:     Mental Status: She is alert and oriented to person, place, and time.     Cranial Nerves: No cranial nerve deficit.  Psychiatric:        Mood and Affect: Mood normal.     ED Results / Procedures / Treatments   Labs (all labs ordered are listed, but only abnormal results are displayed) Labs Reviewed  COMPREHENSIVE METABOLIC PANEL - Abnormal; Notable for the following components:      Result Value   Potassium 2.0 (*)    Chloride 91 (*)    CO2 37 (*)    Glucose, Bld 118 (*)    Creatinine, Ser 1.54 (*)    Calcium 8.0 (*)    Albumin 3.0 (*)    AST 46 (*)    Alkaline Phosphatase 129 (*)    Total Bilirubin 1.8 (*)    GFR, Estimated 39 (*)    All other components within normal limits  CBC - Abnormal; Notable for the following components:   RBC 3.70 (*)    MCV 104.6 (*)    MCH 35.4 (*)    RDW 15.7 (*)    All other components within normal limits  D-DIMER, QUANTITATIVE - Abnormal; Notable for the following components:   D-Dimer, Quant 2.45 (*)    All other components within normal limits  MAGNESIUM - Abnormal; Notable for the following components:   Magnesium 1.3 (*)    All other components within  normal limits  PHOSPHORUS - Abnormal; Notable for the following components:   Phosphorus 1.9 (*)    All other components within normal limits  I-STAT BETA HCG BLOOD, ED ([facility], [facility], [facility] ONLY) - Abnormal; Notable for the following components:   I-stat hCG, quantitative 17.7 (*)    All other components within normal limits  RESP PANEL BY RT-PCR (FLU A&B, COVID) ARPGX2  HIV ANTIBODY (ROUTINE TESTING W REFLEX)  CBC  COMPREHENSIVE  METABOLIC PANEL    EKG EKG Interpretation  Date/Time:  Sunday April 24 2022 15:39:37 EST Ventricular Rate:  94 PR Interval:  133 QRS Duration: 89 QT Interval:  439 QTC Calculation: 549 R Axis:   -5 Text Interpretation: Sinus rhythm LVH by voltage Prolonged QT interval Borderline ECG Confirmed by Carmin Muskrat [phone]) on 04/24/2022 4:31:51 PM  Radiology DG Chest Port 1 View  Result Date: 04/24/2022 CLINICAL DATA:  Short of breath EXAM: PORTABLE CHEST 1 VIEW COMPARISON:  None Available. FINDINGS: Normal mediastinum and cardiac silhouette. Normal pulmonary vasculature. No evidence of effusion, infiltrate, or pneumothorax. No acute bony abnormality. IMPRESSION: No acute cardiopulmonary process. Electronically Signed   By: Suzy Bouchard M.D.   On: 04/24/2022 15:41    Procedures Procedures    Medications Ordered in ED Medications  potassium chloride 10 mEq in 100 mL IVPB (10 mEq Intravenous New Bag/Given 04/24/22 1953)  0.9 % NaCl with KCl 40 mEq / L  infusion ( Intravenous New Bag/Given 04/24/22 1947)  acetaminophen (TYLENOL) tablet 650 mg (has no administration in time range)    Or  acetaminophen (TYLENOL) suppository 650 mg (has no administration in time range)  prochlorperazine (COMPAZINE) injection 10 mg (has no administration in time range)  magnesium sulfate IVPB 2 g 50 mL (has no administration in time range)  phosphorus (K PHOS NEUTRAL) tablet 500 mg (has no administration in time range)  amitriptyline (ELAVIL) tablet 75 mg (has no  administration in time range)  fludrocortisone (FLORINEF) tablet 0.1 mg (has no administration in time range)  midodrine (PROAMATINE) tablet 10 mg (has no administration in time range)  abemaciclib (VERZENIO) tablet 100 mg (has no administration in time range)  [employer] 5 % SOLN 1 drop (has no administration in time range)  omeprazole (PRILOSEC OTC) EC tablet 20 mg (has no administration in time range)  methocarbamol (ROBAXIN) tablet 500 mg (has no administration in time range)  ALPRAZolam (XANAX) tablet 1 mg (has no administration in time range)  albuterol (PROVENTIL) (2.5 MG/3ML) 0.083% nebulizer solution 2.5 mg (has no administration in time range)  potassium chloride SA (KLOR-CON M) CR tablet 40 mEq (40 mEq Oral Given 04/24/22 1640)    ED Course/ Medical Decision Making/ A&P This patient with a Hx of metastatic breast cancer, hypokalemia presents to the ED for concern of dyspnea, fatigue, this involves an extensive number of treatment options, and is a complaint that carries with it a high risk of complications and morbidity.    The differential diagnosis includes medication associated electrolyte abnormalities, pneumonia, PE, ACS   Social Determinants of Health:  Age, cancer survivor  Additional history obtained:  Additional history and/or information obtained from EMS, notable for details included in HPI   After the initial evaluation, orders, including: Labs ECG monitoring were initiated.   Patient placed on Cardiac and Pulse-Oximetry Monitors. The patient was maintained on a cardiac monitor.  The cardiac monitored showed an rhythm of 100 sinus rhythm/sinus tach, borderline The patient was also maintained on pulse oximetry. The readings were typically 99% room air, though she was previously on supplemental oxygen   On repeat evaluation of the patient improved  Lab Tests:  I personally interpreted labs.  The pertinent results include: Hypokalemia, with a value of 2.0,  this is diminished since last week following supplementation was provided after prior episode  Imaging Studies ordered:  I independently visualized and interpreted imaging which showed unremarkable chest x-ray I agree with the radiologist interpretation   Dispostion / Final  MDM:  After consideration of the diagnostic results and the patient's response to treatment, this adult female with multiple medical issues including metastatic cancer presents with occasional dyspnea, fatigue, tremor, shakiness.  Patient found to have critically abnormal potassium value, 2.0.  Notably, the patient had repletion last week with infusion.  Suspicion for either midodrine or fluconazole contributing to her recurrent episodes.  Patient required repletion with both oral and IV potassium via IV.  Initial evidence otherwise somewhat reassuring, without substantial ECG changes 7 borderline QT prolongation.  Given concern for critical abnormal potassium value patient was admitted for further monitoring, management.  Final Clinical Impression(s) / ED Diagnoses Final diagnoses:  Hypokalemia  Shortness of breath   CRITICAL CARE Performed by: Carmin Muskrat Total critical care time: 35 minutes Critical care time was exclusive of separately billable procedures and treating other patients. Critical care was necessary to treat or prevent imminent or life-threatening deterioration. Critical care was time spent personally by me on the following activities: development of treatment plan with patient and/or surrogate as well as nursing, discussions with consultants, evaluation of patient's response to treatment, examination of patient, obtaining history from patient or surrogate, ordering and performing treatments and interventions, ordering and review of laboratory studies, ordering and review of radiographic studies, pulse oximetry and re-evaluation of patient's condition.    Carmin Muskrat, MD 04/24/22 2103

## 2022-04-25 ENCOUNTER — Observation Stay (HOSPITAL_COMMUNITY): Payer: Medicare Other

## 2022-04-25 DIAGNOSIS — Z853 Personal history of malignant neoplasm of breast: Secondary | ICD-10-CM | POA: Diagnosis not present

## 2022-04-25 DIAGNOSIS — H9202 Otalgia, left ear: Secondary | ICD-10-CM | POA: Diagnosis present

## 2022-04-25 DIAGNOSIS — E876 Hypokalemia: Secondary | ICD-10-CM | POA: Diagnosis present

## 2022-04-25 DIAGNOSIS — K219 Gastro-esophageal reflux disease without esophagitis: Secondary | ICD-10-CM | POA: Diagnosis present

## 2022-04-25 DIAGNOSIS — E669 Obesity, unspecified: Secondary | ICD-10-CM | POA: Diagnosis present

## 2022-04-25 DIAGNOSIS — R0602 Shortness of breath: Secondary | ICD-10-CM | POA: Diagnosis present

## 2022-04-25 DIAGNOSIS — Z801 Family history of malignant neoplasm of trachea, bronchus and lung: Secondary | ICD-10-CM | POA: Diagnosis not present

## 2022-04-25 DIAGNOSIS — Z8261 Family history of arthritis: Secondary | ICD-10-CM | POA: Diagnosis not present

## 2022-04-25 DIAGNOSIS — E785 Hyperlipidemia, unspecified: Secondary | ICD-10-CM | POA: Diagnosis present

## 2022-04-25 DIAGNOSIS — N1832 Chronic kidney disease, stage 3b: Secondary | ICD-10-CM | POA: Diagnosis present

## 2022-04-25 DIAGNOSIS — K760 Fatty (change of) liver, not elsewhere classified: Secondary | ICD-10-CM | POA: Diagnosis present

## 2022-04-25 DIAGNOSIS — Z8249 Family history of ischemic heart disease and other diseases of the circulatory system: Secondary | ICD-10-CM | POA: Diagnosis not present

## 2022-04-25 DIAGNOSIS — I129 Hypertensive chronic kidney disease with stage 1 through stage 4 chronic kidney disease, or unspecified chronic kidney disease: Secondary | ICD-10-CM | POA: Diagnosis present

## 2022-04-25 DIAGNOSIS — F419 Anxiety disorder, unspecified: Secondary | ICD-10-CM | POA: Diagnosis present

## 2022-04-25 DIAGNOSIS — I2510 Atherosclerotic heart disease of native coronary artery without angina pectoris: Secondary | ICD-10-CM | POA: Diagnosis present

## 2022-04-25 DIAGNOSIS — Z1152 Encounter for screening for COVID-19: Secondary | ICD-10-CM | POA: Diagnosis not present

## 2022-04-25 DIAGNOSIS — I951 Orthostatic hypotension: Secondary | ICD-10-CM | POA: Diagnosis present

## 2022-04-25 DIAGNOSIS — Z888 Allergy status to other drugs, medicaments and biological substances status: Secondary | ICD-10-CM | POA: Diagnosis not present

## 2022-04-25 DIAGNOSIS — Z833 Family history of diabetes mellitus: Secondary | ICD-10-CM | POA: Diagnosis not present

## 2022-04-25 DIAGNOSIS — Z6830 Body mass index (BMI) 30.0-30.9, adult: Secondary | ICD-10-CM | POA: Diagnosis not present

## 2022-04-25 DIAGNOSIS — Z87891 Personal history of nicotine dependence: Secondary | ICD-10-CM | POA: Diagnosis not present

## 2022-04-25 DIAGNOSIS — H548 Legal blindness, as defined in USA: Secondary | ICD-10-CM | POA: Diagnosis present

## 2022-04-25 LAB — CBC
HCT: 34.4 % — ABNORMAL LOW (ref 36.0–46.0)
Hemoglobin: 11.2 g/dL — ABNORMAL LOW (ref 12.0–15.0)
MCH: 34.9 pg — ABNORMAL HIGH (ref 26.0–34.0)
MCHC: 32.6 g/dL (ref 30.0–36.0)
MCV: 107.2 fL — ABNORMAL HIGH (ref 80.0–100.0)
Platelets: 211 10*3/uL (ref 150–400)
RBC: 3.21 MIL/uL — ABNORMAL LOW (ref 3.87–5.11)
RDW: 15.5 % (ref 11.5–15.5)
WBC: 6.2 10*3/uL (ref 4.0–10.5)
nRBC: 0 % (ref 0.0–0.2)

## 2022-04-25 LAB — COMPREHENSIVE METABOLIC PANEL
ALT: 28 U/L (ref 0–44)
AST: 42 U/L — ABNORMAL HIGH (ref 15–41)
Albumin: 2.8 g/dL — ABNORMAL LOW (ref 3.5–5.0)
Alkaline Phosphatase: 113 U/L (ref 38–126)
Anion gap: 9 (ref 5–15)
BUN: 8 mg/dL (ref 6–20)
CO2: 37 mmol/L — ABNORMAL HIGH (ref 22–32)
Calcium: 7.7 mg/dL — ABNORMAL LOW (ref 8.9–10.3)
Chloride: 97 mmol/L — ABNORMAL LOW (ref 98–111)
Creatinine, Ser: 1.27 mg/dL — ABNORMAL HIGH (ref 0.44–1.00)
GFR, Estimated: 49 mL/min — ABNORMAL LOW (ref >60)
Glucose, Bld: 100 mg/dL — ABNORMAL HIGH (ref 70–99)
Potassium: 3.1 mmol/L — ABNORMAL LOW (ref 3.5–5.1)
Sodium: 143 mmol/L (ref 135–145)
Total Bilirubin: 1.7 mg/dL — ABNORMAL HIGH (ref 0.3–1.2)
Total Protein: 6.5 g/dL (ref 6.5–8.1)

## 2022-04-25 LAB — HIV ANTIBODY (ROUTINE TESTING W REFLEX): HIV Screen 4th Generation wRfx: NONREACTIVE

## 2022-04-25 LAB — MAGNESIUM: Magnesium: 1.5 mg/dL — ABNORMAL LOW (ref 1.7–2.4)

## 2022-04-25 LAB — PHOSPHORUS: Phosphorus: 1.7 mg/dL — ABNORMAL LOW (ref 2.5–4.6)

## 2022-04-25 MED ORDER — SODIUM CHLORIDE 0.9 % IV SOLN: INTRAVENOUS | Status: DC | PRN

## 2022-04-25 MED ORDER — MAGNESIUM SULFATE 4 GM/100ML IV SOLN
4.0000 g | Freq: Once | INTRAVENOUS | Status: AC
  Administered 2022-04-25: 4 g via INTRAVENOUS
  Filled 2022-04-25: qty 100

## 2022-04-25 MED ORDER — POTASSIUM CHLORIDE CRYS ER 20 MEQ PO TBCR
40.0000 meq | EXTENDED_RELEASE_TABLET | Freq: Two times a day (BID) | ORAL | Status: DC
  Administered 2022-04-25 – 2022-04-26 (×3): 40 meq via ORAL
  Filled 2022-04-25 (×3): qty 2

## 2022-04-25 MED ORDER — SODIUM CHLORIDE 0.9 % IV SOLN: INTRAVENOUS | Status: DC

## 2022-04-25 MED ORDER — TECHNETIUM TO 99M ALBUMIN AGGREGATED
4.4000 | Freq: Once | INTRAVENOUS | Status: AC | PRN
  Administered 2022-04-25: 4.4 via INTRAVENOUS

## 2022-04-25 MED ORDER — ALUM & MAG HYDROXIDE-SIMETH 200-200-20 MG/5ML PO SUSP
30.0000 mL | Freq: Four times a day (QID) | ORAL | Status: DC | PRN
  Administered 2022-04-25 – 2022-04-26 (×2): 30 mL via ORAL
  Filled 2022-04-25 (×2): qty 30

## 2022-04-25 NOTE — Evaluation (Signed)
Physical Therapy Evaluation-1x Patient Details Name: Brandy [name] [facility] MRN: [id number] DOB: 01-Oct-1965 Today's Date: 04/25/2022   SATURATION QUALIFICATIONS: (This note is used to comply with regulatory documentation for home oxygen)  Patient Saturations on Room Air at Rest = 90%  Patient Saturations on Room Air while Ambulating = 86%  Patient Saturations on 2 Liters of oxygen while Ambulating = 95%      History of Present Illness  56 yo female admitted with severe hypokalemia, hypomagnesemia, hypophosphatemia.  Clinical Impression  On eval, pt was Mod Ind with mobility. She walked ~150 feet around the unit while pushing IV pole She walked to/from bathroom without holding on to pole. She tolerated activity well. Do not anticipate any further acute PT needs. Recommend mobility team assist pt with mobilizing as tolerated. 1x eval. Will sign off.        Recommendations for follow up therapy are one component of a multi-disciplinary discharge planning process, led by the attending physician.  Recommendations may be updated based on patient status, additional functional criteria and insurance authorization.  Follow Up Recommendations No PT follow up      Assistance Recommended at Discharge PRN  Patient can return home with the following       Equipment Recommendations None recommended by PT  Recommendations for Other Services       Functional Status Assessment Patient has had a recent decline in their functional status and demonstrates the ability to make significant improvements in function in a reasonable and predictable amount of time.     Precautions / Restrictions        Mobility  Bed Mobility Overal bed mobility: Modified Independent                  Transfers Overall transfer level: Modified independent                      Ambulation/Gait Ambulation/Gait assistance: Modified independent (Device/Increase time) Gait Distance (Feet):  150 Feet Assistive device: IV Pole Gait Pattern/deviations: Step-through pattern          Stairs            Wheelchair Mobility    Modified Rankin (Stroke Patients Only)       Balance                                             Pertinent Vitals/Pain Pain Assessment Pain Assessment: No/denies pain    Home Living Family/patient expects to be discharged to:: Private residence Living Arrangements: Spouse/significant other   Type of Home: House Home Access: Stairs to enter Entrance Stairs-Rails: Psychiatric nurse of Steps: "too many"   Home Layout: Two level;Able to live on main level with bedroom/bathroom Home Equipment: Conservation officer, nature (2 wheels);BSC/3in1      Prior Function Prior Level of Function : Independent/Modified Independent             Mobility Comments: uses RW vs can PRN       Hand Dominance        Extremity/Trunk Assessment   Upper Extremity Assessment Upper Extremity Assessment: Overall WFL for tasks assessed    Lower Extremity Assessment Lower Extremity Assessment: Generalized weakness    Cervical / Trunk Assessment Cervical / Trunk Assessment: Normal  Communication   Communication: No difficulties  Cognition Arousal/Alertness: Awake/alert Behavior During Therapy: WFL for tasks  assessed/performed Overall Cognitive Status: Within Functional Limits for tasks assessed                                          General Comments      Exercises     Assessment/Plan    PT Assessment Patient does not need any further PT services  PT Problem List         PT Treatment Interventions      PT Goals (Current goals can be found in the Care Plan section)  Acute Rehab PT Goals Patient Stated Goal: home soon PT Goal Formulation: All assessment and education complete, DC therapy    Frequency       Co-evaluation               AM-PAC PT "6 Clicks" Mobility  Outcome  Measure Help needed turning from your back to your side while in a flat bed without using bedrails?: None Help needed moving from lying on your back to sitting on the side of a flat bed without using bedrails?: None Help needed moving to and from a bed to a chair (including a wheelchair)?: None Help needed standing up from a chair using your arms (e.g., wheelchair or bedside chair)?: None Help needed to walk in hospital room?: None Help needed climbing 3-5 steps with a railing? : A Little 6 Click Score: 23    End of Session Equipment Utilized During Treatment: Gait belt Activity Tolerance: Patient tolerated treatment well Patient left: in bed;with call bell/phone within reach        Time: 1437-1459 PT Time Calculation (min) (ACUTE ONLY): 22 min   Charges:   PT Evaluation $PT Eval Low Complexity: [place], PT Acute Rehabilitation  Office: [phone]

## 2022-04-25 NOTE — Progress Notes (Signed)
PROGRESS NOTE  KAYLANIE CAPILI [facility]  ZOX:[id number] DOB: Oct 08, 1965 DOA: 04/24/2022 PCP: Sandrea Hughs, NP   Brief Narrative:  Patient is a 56 year old female with history of anxiety, hiatal hernia, CAD,, breast cancer, stage IIIa CKD, hyperlipidemia, hypertension, hypokalemia who presented to the emergency room with complaint of shortness of breath, bilateral lower extremity cramps.She was hemodynamically stable.  Lab work showed potassium of 2, creatinine of 1.5, phosphorus of 1.9, magnesium of 1.3.  Patient was admitted for the management of severe hypokalemia.  Assessment & Plan:  Principal Problem:   Hypokalemia Active Problems:   Anxiety disorder   Breast cancer (HCC)   GERD (gastroesophageal reflux disease)   Dyspnea   Hypophosphatemia   Hypomagnesemia   Orthostatic hypotension   Hyperbilirubinemia   Severe electrolyte problems: Presented with severe hypokalemia, hypomagnesemia, hypophosphatemia.  On fludrocortisone at home for orthostatic hypotension. Continue aggressive supplementation with electrolytes.  Fludrocortisone will be held because it might be contributing to hypokalemia  Dyspnea: Maintaining saturation on room air.  Has elevated D-dimer.  VQ scan ordered to rule out PE  Orthostatic hypotension: On midodrine, fludrocortisone at home.  Currently blood pressure stable  Anxiety disorder: On Xanax as needed  CKD stage IIIb: Currently kidney function at baseline.  Baseline creatinine around 1.4-1.8.  History of breast cancer: [place].We recommend outpatient follow-up with oncology  GERD: Continue Protonix  Fever? :  Temperature recorded of 100.5 F this morning.  No leukocytosis.  Not sure whether this is a true temperature.  Weakness: We will request a PT assessment       DVT prophylaxis:SCDs Start: 04/24/22 1700     Code Status: Full Code  Family Communication: Husband at bedside  Patient status:Obs  Patient is from  :Home  Anticipated discharge WJ:XBJY  Estimated DC date: 1 to 2 days   Consultants: None  Procedures:None  Antimicrobials:  Anti-infectives (From admission, onward)    None       Subjective: Patient seen and examined the bedside today.  Hemodynamically stable.  Lying in bed.  Continues to complain of weakness and not feeling well.  No nausea or vomiting or diarrhea or abdominal pain.  Denies shortness of breath or cough.  Objective: Vitals:   04/24/22 1956 04/24/22 2318 04/25/22 0316 04/25/22 0723  BP:  134/85 (!) 145/70 (!) 141/63  Pulse:  96 95 97  Resp:  '18 14 20  '$ Temp:  98.7 F (37.1 C) 98.2 F (36.8 C) (!) 100.5 F (38.1 C)  TempSrc:  Oral Oral Oral  SpO2:  97% 98% 96%  Weight: 89.7 kg     Height: '5\' 8"'$  (1.727 m)       Intake/Output Summary (Last 24 hours) at 04/25/2022 0753 Last data filed at 04/24/2022 2000 Gross per 24 hour  Intake 248.92 ml  Output --  Net 248.92 ml   Filed Weights   04/24/22 1956  Weight: 89.7 kg    Examination:  General exam: Overall comfortable, not in distress, obese HEENT: prosthetic right eye Respiratory system:  no wheezes or crackles  Cardiovascular system: S1 & S2 heard, RRR.  Gastrointestinal system: Abdomen is nondistended, soft and nontender. Central nervous system: Alert and oriented Extremities: No edema, no clubbing ,no cyanosis Skin: No rashes, no ulcers,no icterus     Data Reviewed: I have personally reviewed following labs and imaging studies  CBC: Recent Labs  Lab 04/22/22 0803 04/24/22 1545  WBC 5.1 7.7  NEUTROABS 1.9  --   HGB 11.0* 13.1  HCT 32.8*  38.7  MCV 105.5* 104.6*  PLT 195 785   Basic Metabolic Panel: Recent Labs  Lab 04/22/22 0803 04/24/22 1545  NA 145 140  K 2.5* 2.0*  CL 95* 91*  CO2 44* 37*  GLUCOSE 152* 118*  BUN 13 10  CREATININE 1.51* 1.54*  CALCIUM 8.9 8.0*  MG  --  1.3*  PHOS  --  1.9*     Recent Results (from the past 240 hour(s))  Resp Panel by RT-PCR (Flu  A&B, Covid) Anterior Nasal Swab     Status: None   Collection Time: 04/24/22  3:45 PM   Specimen: Anterior Nasal Swab  Result Value Ref Range Status   SARS Coronavirus 2 by RT PCR NEGATIVE NEGATIVE Final    Comment: (NOTE) SARS-CoV-2 target nucleic acids are NOT DETECTED.  The SARS-CoV-2 RNA is generally detectable in upper respiratory specimens during the acute phase of infection. The lowest concentration of SARS-CoV-2 viral copies this assay can detect is 138 copies/mL. A negative result does not preclude SARS-Cov-2 infection and should not be used as the sole basis for treatment or other patient management decisions. A negative result may occur with  improper specimen collection/handling, submission of specimen other than nasopharyngeal swab, presence of viral mutation(s) within the areas targeted by this assay, and inadequate number of viral copies(<138 copies/mL). A negative result must be combined with clinical observations, patient history, and epidemiological information. The expected result is Negative.  Fact Sheet for Patients:  EntrepreneurPulse.com.au  Fact Sheet for Healthcare Providers:  IncredibleEmployment.be  This test is no t yet approved or cleared by the [place] FDA and  has been authorized for detection and/or diagnosis of SARS-CoV-2 by FDA under an Emergency Use Authorization (EUA). This EUA will remain  in effect (meaning this test can be used) for the duration of the COVID-19 declaration under Section 564(b)(1) of the Act, 21 U.S.C.section 360bbb-3(b)(1), unless the authorization is terminated  or revoked sooner.       Influenza A by PCR NEGATIVE NEGATIVE Final   Influenza B by PCR NEGATIVE NEGATIVE Final    Comment: (NOTE) The Xpert Xpress SARS-CoV-2/FLU/RSV plus assay is intended as an aid in the diagnosis of influenza from Nasopharyngeal swab specimens and should not be used as a sole basis for treatment.  Nasal washings and aspirates are unacceptable for Xpert Xpress SARS-CoV-2/FLU/RSV testing.  Fact Sheet for Patients: EntrepreneurPulse.com.au  Fact Sheet for Healthcare Providers: IncredibleEmployment.be  This test is not yet approved or cleared by the [place] FDA and has been authorized for detection and/or diagnosis of SARS-CoV-2 by FDA under an Emergency Use Authorization (EUA). This EUA will remain in effect (meaning this test can be used) for the duration of the COVID-19 declaration under Section 564(b)(1) of the Act, 21 U.S.C. section 360bbb-3(b)(1), unless the authorization is terminated or revoked.  Performed at [facility], [place] [address] [place], [place] [address]      Radiology Studies: [facility] Chest Port 1 View  Result Date: 04/24/2022 CLINICAL DATA:  Short of breath EXAM: PORTABLE CHEST 1 VIEW COMPARISON:  None Available. FINDINGS: Normal mediastinum and cardiac silhouette. Normal pulmonary vasculature. No evidence of effusion, infiltrate, or pneumothorax. No acute bony abnormality. IMPRESSION: No acute cardiopulmonary process. Electronically Signed   By: Suzy Bouchard M.D.   On: 04/24/2022 15:41    Scheduled Meds:  abemaciclib  100 mg Oral BID   ALPRAZolam  1 mg Oral QHS   amitriptyline  75 mg Oral QHS   fludrocortisone  0.1 mg Oral TID   Lifitegrast  1 drop Left Eye QID   midodrine  10 mg Oral TID   pantoprazole  40 mg Oral Daily   phosphorus  500 mg Oral QID   Continuous Infusions:  0.9 % NaCl with KCl 40 mEq / L 125 mL/hr at 04/25/22 0352     LOS: 0 days   Shelly Coss, MD [facility] P11/20/2023, 7:53 AM
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
## 2022-04-26 DIAGNOSIS — E876 Hypokalemia: Secondary | ICD-10-CM | POA: Diagnosis not present

## 2022-04-26 LAB — BASIC METABOLIC PANEL
Anion gap: 8 (ref 5–15)
BUN: 8 mg/dL (ref 6–20)
CO2: 35 mmol/L — ABNORMAL HIGH (ref 22–32)
Calcium: 7.9 mg/dL — ABNORMAL LOW (ref 8.9–10.3)
Chloride: 97 mmol/L — ABNORMAL LOW (ref 98–111)
Creatinine, Ser: 1.12 mg/dL — ABNORMAL HIGH (ref 0.44–1.00)
GFR, Estimated: 57 mL/min — ABNORMAL LOW (ref >60)
Glucose, Bld: 104 mg/dL — ABNORMAL HIGH (ref 70–99)
Potassium: 3.1 mmol/L — ABNORMAL LOW (ref 3.5–5.1)
Sodium: 140 mmol/L (ref 135–145)

## 2022-04-26 LAB — PHOSPHORUS: Phosphorus: 2.6 mg/dL (ref 2.5–4.6)

## 2022-04-26 LAB — MAGNESIUM: Magnesium: 1.8 mg/dL (ref 1.7–2.4)

## 2022-04-26 MED ORDER — AMOXICILLIN-POT CLAVULANATE 875-125 MG PO TABS: 1.0000 | ORAL_TABLET | Freq: Two times a day (BID) | ORAL | 0 refills | Status: AC

## 2022-04-26 MED ORDER — K-PHOS 500 MG PO TABS: 500.0000 mg | ORAL_TABLET | Freq: Three times a day (TID) | ORAL | 0 refills | Status: AC

## 2022-04-26 MED ORDER — MAGNESIUM OXIDE 400 MG PO CAPS: 400.0000 mg | ORAL_CAPSULE | Freq: Every day | ORAL | 0 refills | Status: AC

## 2022-04-26 MED ORDER — POTASSIUM CHLORIDE CRYS ER 20 MEQ PO TBCR: 40.0000 meq | EXTENDED_RELEASE_TABLET | Freq: Two times a day (BID) | ORAL | 0 refills | Status: DC

## 2022-04-26 MED ORDER — AMOXICILLIN-POT CLAVULANATE 875-125 MG PO TABS
1.0000 | ORAL_TABLET | Freq: Two times a day (BID) | ORAL | Status: DC
  Administered 2022-04-26: 1 via ORAL
  Filled 2022-04-26: qty 1

## 2022-04-26 NOTE — TOC Transition Note (Signed)
Transition of Care [facility]) - CM/SW Discharge Note   Patient Details  Name: Brandy [name] MRN: [id number] Date of Birth: 1965/12/02  Transition of Care [facility]) CM/SW Contact:  Vassie Moselle, LCSW Phone Number: 04/26/2022, 12:16 PM   Clinical Narrative:    Pt is to return home with new O2 requirement. Pt is agreeable to having O2 ordered for home. O2 is to be delivered to pt's room by [place] prior to discharge.    Final next level of care: Home/Self Care Barriers to Discharge: No Barriers Identified   Patient Goals and CMS Choice Patient states their goals for this hospitalization and ongoing recovery are:: To find somewhere to move to CMS Medicare.gov Compare Post Acute Care list provided to:: Patient Choice offered to / list presented to : Patient  Discharge Placement                       Discharge Plan and Services In-house Referral: NA Discharge Planning Services: CM Consult Post Acute Care Choice: NA          DME Arranged: Oxygen DME Agency: [employer] Date DME Agency Contacted: 04/26/22 Time DME Agency Contacted: [id number] Representative spoke with at DME Agency: [place] ([place]) Interventions Food Insecurity Interventions: [id number] Referral, Other (Comment) (Resources added to AVS) Housing Interventions: [id number] Referral Transportation Interventions: Payor Benefit, Other (Comment) (Resources added to AVS) Utilities Interventions: Intervention Not Indicated (Pt's spouse pays for utlities)   Readmission Risk Interventions    04/26/2022   12:15 PM  Readmission Risk Prevention Plan  Transportation Screening Complete  PCP or Specialist Appt within 5-7 Days Complete  Home Care Screening Complete  Medication Review (RN CM) Complete

## 2022-04-26 NOTE — Progress Notes (Signed)
Mobility Specialist - Progress Note   04/26/22 1318  Oxygen Therapy  O2 Device Nasal Cannula  O2 Flow Rate (L/min) 2 L/min  Mobility  Activity Ambulated with assistance in hallway  Level of Assistance Modified independent, requires aide device or extra time  Assistive Device [employer] Ambulated (ft) 500 ft  Activity Response Tolerated well  Mobility Referral Yes  $Mobility charge 1 Mobility   Pt received in bed and agreed to mobility, no c/o pain nor discomfort during session. Pt returned to bed with all needs met.   Roderick Pee Mobility Specialist

## 2022-04-26 NOTE — Plan of Care (Signed)

## 2022-04-26 NOTE — Progress Notes (Addendum)
Pt. Discharge via wheelchair accompanied by husband. Pt. Is alert and oriented, no complaints. AVS discussed, pt. Verbalized understanding. All personal belongings are with the family. Pt. Own meds from pharmacy returned to patient.

## 2022-04-26 NOTE — Discharge Summary (Signed)
Physician Discharge Summary  Brandy [name] [facility] ZDG:[id number] DOB: February 08, 1966 DOA: 04/24/2022  PCP: Sandrea Hughs, NP  Admit date: 04/24/2022 Discharge date: 04/26/2022  Admitted From: Home Disposition:  Home  Discharge Condition:Stable CODE STATUS:FULL Diet recommendation: Heart Healthy  Brief/Interim Summary: Patient is a 56 year old female with history of anxiety, hiatal hernia, CAD,, breast cancer, stage IIIa CKD, hyperlipidemia, hypertension, hypokalemia who presented to the emergency room with complaint of shortness of breath, bilateral lower extremity cramps.She was hemodynamically stable.  Lab work showed potassium of 2, creatinine of 1.5, phosphorus of 1.9, magnesium of 1.3.  Patient was admitted for the management of severe hypokalemia.  Hospital course remained stable.  Potassium, magnesium, phosphorus aggressively supplemented.  Patient seen by PT and recommended no follow-up but she qualified for home oxygen.  Medically stable for discharge today.  Following problems were addressed during her hospitalization:  Severe electrolyte problems: Presented with severe hypokalemia, hypomagnesemia, hypophosphatemia.  On fludrocortisone at home for orthostatic hypotension. Had aggressive supplementation with electrolytes.  Fludrocortisone will be held because it might be contributing to hypokalemia. Check potassium, and examined, phosphorus level in a week.   Dyspnea/respiratory insufficiency: Has elevated D-dimer.  VQ scan ordered to rule out PE given to be negative.  She has history of chronic dyspnea.  She was following with pulmonology.  Her PFT showed restrictive defect.  Takes Advair, albuterol at home.  She qualified for home oxygen for 2 L/min.  We recommend to follow-up with pulmonology as an outpatient   Orthostatic hypotension: On midodrine, fludrocortisone at home.  Currently blood pressure stable.  Fludrocortisone discontinued   Anxiety disorder: On Xanax as  needed   CKD stage IIIb: Currently kidney function at baseline.  Baseline creatinine around 1.4-1.8.  Follows with [facility]   History of breast cancer: Verzenio.We recommend outpatient follow-up with oncology   GERD: Continue Protonix   Fever/ear ache:  Temperature recorded of 100.5 F in the morning of 11/20.Marland Kitchen  No leukocytosis.  Not sure whether this is a true temperature. See comment of left earache.  She Has history of otitis media.  Her ears were examined, she does not have any discharge, no tenderness on the external auditory canal.  Started on 5 days course of Augmentin.  Recommended follow-up with ENT as an outpatient   Weakness:  PT assessment done,no  follow-up recommended    Discharge Diagnoses:  Principal Problem:   Hypokalemia Active Problems:   Anxiety disorder   Breast cancer (HCC)   GERD (gastroesophageal reflux disease)   Dyspnea   Hypophosphatemia   Hypomagnesemia   Orthostatic hypotension   Hyperbilirubinemia    Discharge Instructions  Discharge Instructions     Diet - low sodium heart healthy   Complete by: As directed    Discharge instructions   Complete by: As directed    1)Please take prescribed medications as instructed 2)Follow up with your PCP in a week.  Please check potassium, magnesium and phosphorus level during the follow-up   Increase activity slowly   Complete by: As directed       Allergies as of 04/26/2022       Reactions   Zithromax [azithromycin] Shortness Of Breath, Itching   TOTAL BODY ITCHING [EVEN SOLES OF FEET] WHEEZING   Tramadol Itching, Other (See Comments)   Has taken recently without any side effects.   Psyllium Nausea And Vomiting, Other (See Comments)   Metamucil. Sneezing         Medication List     STOP  taking these medications    fludrocortisone 0.1 MG tablet Commonly known as: FLORINEF       TAKE these medications    acetaminophen 500 MG tablet Commonly known as: TYLENOL Take 500 mg by  mouth daily as needed for moderate pain.   albuterol (2.5 MG/3ML) 0.083% nebulizer solution Commonly known as: PROVENTIL Take 3 mLs (2.5 mg total) by nebulization every 6 (six) hours as needed for wheezing or shortness of breath. What changed: when to take this   ALPRAZolam 1 MG tablet Commonly known as: XANAX TAKE 1 TABLET BY MOUTH AT  BEDTIME   amitriptyline 75 MG tablet Commonly known as: ELAVIL Take 75 mg by mouth at bedtime.   amoxicillin-clavulanate 875-125 MG tablet Commonly known as: AUGMENTIN Take 1 tablet by mouth every 12 (twelve) hours for 5 days.   Belbuca 150 MCG Film Generic drug: Buprenorphine HCl Take 150 mcg by mouth every 12 (twelve) hours.   diphenoxylate-atropine 2.5-0.025 MG tablet Commonly known as: LOMOTIL Take 1 tablet by mouth 3 (three) times daily as needed for diarrhea or loose stools.   fluticasone-salmeterol 250-50 MCG/ACT Aepb Commonly known as: Wixela Inhub Inhale 1 puff into the lungs in the morning and at bedtime.   K-Phos 500 MG tablet Generic drug: potassium phosphate (monobasic) Take 1 tablet (500 mg total) by mouth 4 (four) times daily -  with meals and at bedtime for 2 days.   ketoconazole 2 % cream Commonly known as: NIZORAL SMARTSIG:1 Application Topical 1 to 2 Times Daily   ketorolac 0.5 % ophthalmic solution Commonly known as: ACULAR Place 1 drop into the left eye 3 (three) times daily.   loperamide 2 MG capsule Commonly known as: IMODIUM Take 1-2 capsules (2-4 mg total) by mouth 4 (four) times daily as needed for diarrhea or loose stools. What changed:  how much to take when to take this   Lubricant Eye Nighttime Oint Place 1 drop into the left eye at bedtime as needed (dryness).   LUBRICATING EYE DROPS OP Place 1 drop into the right eye in the morning, at noon, and at bedtime.   Magnesium Oxide 400 MG Caps Take 1 capsule (400 mg total) by mouth daily.   methocarbamol 500 MG tablet Commonly known as: ROBAXIN Take  1 tablet (500 mg total) by mouth every 6 (six) hours as needed for muscle spasms. What changed: when to take this   midodrine 10 MG tablet Commonly known as: PROAMATINE Take 1 tablet (10 mg total) by mouth 3 (three) times daily.   multivitamin with minerals Tabs tablet Take 1 tablet by mouth daily.   omeprazole 20 MG tablet Commonly known as: PRILOSEC OTC Take 20 mg by mouth daily.   ondansetron 8 MG tablet Commonly known as: ZOFRAN TAKE 1 TABLET(8 MG) BY MOUTH EVERY 8 HOURS AS NEEDED FOR NAUSEA OR VOMITING   potassium chloride SA 20 MEQ tablet Commonly known as: KLOR-CON M Take 2 tablets (40 mEq total) by mouth 2 (two) times daily for 7 days.   rosuvastatin 10 MG tablet Commonly known as: CRESTOR TAKE 1 TABLET BY MOUTH DAILY What changed: when to take this   triamcinolone cream 0.1 % Commonly known as: KENALOG Apply 1 Application topically 2 (two) times daily.   Verzenio 100 MG tablet Generic drug: abemaciclib TAKE 1 TABLET BY MOUTH TWICE DAILY   Vitamin D (Ergocalciferol) 1.25 MG (50000 UNIT) Caps capsule Commonly known as: DRISDOL TAKE 1 CAPSULE ONCE PER WEEK What changed:  how much to take  how to take this when to take this additional instructions   Xiidra 5 % Soln Generic drug: Lifitegrast Place 1 drop into the left eye in the morning, at noon, and at bedtime.               Durable Medical Equipment  (From admission, onward)           Start     Ordered   04/26/22 1038  For home use only DME oxygen  Once       Question Answer Comment  Length of Need Lifetime   Mode or (Route) Nasal cannula   Liters per Minute 2   Frequency Continuous (stationary and portable oxygen unit needed)   Oxygen delivery system Gas      04/26/22 1037            Follow-up Information     Ngetich, Dinah C, NP. Schedule an appointment as soon as possible for a visit in 1 week(s).   Specialty: Family Medicine Contact information: [place]  [address] [phone]                Allergies  Allergen Reactions   Zithromax [Azithromycin] Shortness Of Breath and Itching    TOTAL BODY ITCHING [EVEN SOLES OF FEET] WHEEZING    Tramadol Itching and Other (See Comments)    Has taken recently without any side effects.   Psyllium Nausea And Vomiting and Other (See Comments)    Metamucil. Sneezing      Consultations: None   Procedures/Studies: NM Pulmonary Perf and Vent  Result Date: 04/25/2022 CLINICAL DATA:  Elevated D-dimer, question pulmonary embolism EXAM: NUCLEAR MEDICINE PERFUSION LUNG SCAN TECHNIQUE: Perfusion images were obtained in multiple projections after intravenous injection of radiopharmaceutical. Ventilation scans intentionally deferred if perfusion scan and chest x-ray adequate for interpretation during COVID 19 epidemic. RADIOPHARMACEUTICALS:  4.4 mCi Tc-51mMAA IV COMPARISON:  Chest radiograph 04/24/2022 FINDINGS: Suspected rounded artifact at upper RIGHT lung on LAO view, not seen on remaining views. Otherwise normal perfusion lung scan. No segmental perfusion defects. IMPRESSION: Pulmonary embolism absent. Electronically Signed   By: MLavonia DanaM.D.   On: 04/25/2022 13:23   DG Chest Port 1 View  Result Date: 04/24/2022 CLINICAL DATA:  Short of breath EXAM: PORTABLE CHEST 1 VIEW COMPARISON:  None Available. FINDINGS: Normal mediastinum and cardiac silhouette. Normal pulmonary vasculature. No evidence of effusion, infiltrate, or pneumothorax. No acute bony abnormality. IMPRESSION: No acute cardiopulmonary process. Electronically Signed   By: SSuzy BouchardM.D.   On: 04/24/2022 15:41      Subjective: Patient seen and examined at bedside today.  Hemodynamically stable for discharge.  Discharge planning discussed with the husband at bedside.  Discharge Exam: Vitals:   04/25/22 2100 04/26/22 0413  BP: (!) 144/72 (!) 110/49  Pulse: 99 83  Resp: 20 17  Temp: 99.8 F (37.7 C) 98.1 F (36.7 C)  SpO2:  96% 93%   Vitals:   04/25/22 0723 04/25/22 1521 04/25/22 2100 04/26/22 0413  BP: (!) 141/63 (!) 143/66 (!) 144/72 (!) 110/49  Pulse: 97 86 99 83  Resp: '20 18 20 17  '$ Temp: (!) 100.5 F (38.1 C) 97.6 F (36.4 C) 99.8 F (37.7 C) 98.1 F (36.7 C)  TempSrc: Oral Oral    SpO2: 96% 100% 96% 93%  Weight:      Height:        General: Pt is alert, awake, not in acute distress,obese Cardiovascular: RRR, S1/S2 +,  no rubs, no gallops Respiratory: CTA bilaterally, no wheezing, no rhonchi Abdominal: Soft, NT, ND, bowel sounds + Extremities: no edema, no cyanosis    The results of significant diagnostics from this hospitalization (including imaging, microbiology, ancillary and laboratory) are listed below for reference.     Microbiology: Recent Results (from the past 240 hour(s))  Resp Panel by RT-PCR (Flu A&B, Covid) Anterior Nasal Swab     Status: None   Collection Time: 04/24/22  3:45 PM   Specimen: Anterior Nasal Swab  Result Value Ref Range Status   SARS Coronavirus 2 by RT PCR NEGATIVE NEGATIVE Final    Comment: (NOTE) SARS-CoV-2 target nucleic acids are NOT DETECTED.  The SARS-CoV-2 RNA is generally detectable in upper respiratory specimens during the acute phase of infection. The lowest concentration of SARS-CoV-2 viral copies this assay can detect is 138 copies/mL. A negative result does not preclude SARS-Cov-2 infection and should not be used as the sole basis for treatment or other patient management decisions. A negative result may occur with  improper specimen collection/handling, submission of specimen other than nasopharyngeal swab, presence of viral mutation(s) within the areas targeted by this assay, and inadequate number of viral copies(<138 copies/mL). A negative result must be combined with clinical observations, patient history, and epidemiological information. The expected result is Negative.  Fact Sheet for Patients:   EntrepreneurPulse.com.au  Fact Sheet for Healthcare Providers:  IncredibleEmployment.be  This test is no t yet approved or cleared by the [place] FDA and  has been authorized for detection and/or diagnosis of SARS-CoV-2 by FDA under an Emergency Use Authorization (EUA). This EUA will remain  in effect (meaning this test can be used) for the duration of the COVID-19 declaration under Section 564(b)(1) of the Act, 21 U.S.C.section 360bbb-3(b)(1), unless the authorization is terminated  or revoked sooner.       Influenza A by PCR NEGATIVE NEGATIVE Final   Influenza B by PCR NEGATIVE NEGATIVE Final    Comment: (NOTE) The Xpert Xpress SARS-CoV-2/FLU/RSV plus assay is intended as an aid in the diagnosis of influenza from Nasopharyngeal swab specimens and should not be used as a sole basis for treatment. Nasal washings and aspirates are unacceptable for Xpert Xpress SARS-CoV-2/FLU/RSV testing.  Fact Sheet for Patients: EntrepreneurPulse.com.au  Fact Sheet for Healthcare Providers: IncredibleEmployment.be  This test is not yet approved or cleared by the [place] FDA and has been authorized for detection and/or diagnosis of SARS-CoV-2 by FDA under an Emergency Use Authorization (EUA). This EUA will remain in effect (meaning this test can be used) for the duration of the COVID-19 declaration under Section 564(b)(1) of the Act, 21 U.S.C. section 360bbb-3(b)(1), unless the authorization is terminated or revoked.  Performed at [facility], [place] [address], [place], [place] [address]      Labs: BNP (last 3 results) No results for input(s): "BNP" in the last 8760 hours. Basic Metabolic Panel: Recent Labs  Lab 04/22/22 0803 04/24/22 1545 04/25/22 0844 04/26/22 0518  NA 145 140 143 140  K 2.5* 2.0* 3.1* 3.1*  CL 95* 91* 97* 97*  CO2 44* 37* 37* 35*  GLUCOSE 152* 118* 100* 104*   BUN '13 10 8 8  '$ CREATININE 1.51* 1.54* 1.27* 1.12*  CALCIUM 8.9 8.0* 7.7* 7.9*  MG  --  1.3* 1.5* 1.8  PHOS  --  1.9* 1.7* 2.6   Liver Function Tests: Recent Labs  Lab 04/22/22 0803 04/24/22 1545 04/25/22 0844  AST 42* 46* 42*  ALT  $'29 29 28  'l$ ALKPHOS 177* 129* 113  BILITOT 0.8 1.8* 1.7*  PROT 6.9 7.3 6.5  ALBUMIN 3.5 3.0* 2.8*   No results for input(s): "LIPASE", "AMYLASE" in the last 168 hours. No results for input(s): "AMMONIA" in the last 168 hours. CBC: Recent Labs  Lab 04/22/22 0803 04/24/22 1545 04/25/22 0844  WBC 5.1 7.7 6.2  NEUTROABS 1.9  --   --   HGB 11.0* 13.1 11.2*  HCT 32.8* 38.7 34.4*  MCV 105.5* 104.6* 107.2*  PLT 195 222 211   Cardiac Enzymes: No results for input(s): "CKTOTAL", "CKMB", "CKMBINDEX", "TROPONINI" in the last 168 hours. BNP: Invalid input(s): "POCBNP" CBG: No results for input(s): "GLUCAP" in the last 168 hours. D-Dimer Recent Labs    04/24/22 1545  DDIMER 2.45*   Hgb A1c No results for input(s): "HGBA1C" in the last 72 hours. Lipid Profile No results for input(s): "CHOL", "HDL", "LDLCALC", "TRIG", "CHOLHDL", "LDLDIRECT" in the last 72 hours. Thyroid function studies No results for input(s): "TSH", "T4TOTAL", "T3FREE", "THYROIDAB" in the last 72 hours.  Invalid input(s): "FREET3" Anemia work up No results for input(s): "VITAMINB12", "FOLATE", "FERRITIN", "TIBC", "IRON", "RETICCTPCT" in the last 72 hours. Urinalysis    Component Value Date/Time   COLORURINE YELLOW 09/04/2019 0953   APPEARANCEUR CLEAR 09/04/2019 0953   LABSPEC 1.041 (H) 09/04/2019 0953   PHURINE 5.0 09/04/2019 0953   GLUCOSEU NEGATIVE 09/04/2019 0953   HGBUR MODERATE (A) 09/04/2019 0953   BILIRUBINUR NEGATIVE 09/04/2019 0953   KETONESUR NEGATIVE 09/04/2019 0953   PROTEINUR NEGATIVE 09/04/2019 0953   UROBILINOGEN 1.0 02/22/2015 2105   NITRITE NEGATIVE 09/04/2019 0953   LEUKOCYTESUR SMALL (A) 09/04/2019 0953   Sepsis Labs Recent Labs  Lab  04/22/22 0803 04/24/22 1545 04/25/22 0844  WBC 5.1 7.7 6.2   Microbiology Recent Results (from the past 240 hour(s))  Resp Panel by RT-PCR (Flu A&B, Covid) Anterior Nasal Swab     Status: None   Collection Time: 04/24/22  3:45 PM   Specimen: Anterior Nasal Swab  Result Value Ref Range Status   SARS Coronavirus 2 by RT PCR NEGATIVE NEGATIVE Final    Comment: (NOTE) SARS-CoV-2 target nucleic acids are NOT DETECTED.  The SARS-CoV-2 RNA is generally detectable in upper respiratory specimens during the acute phase of infection. The lowest concentration of SARS-CoV-2 viral copies this assay can detect is 138 copies/mL. A negative result does not preclude SARS-Cov-2 infection and should not be used as the sole basis for treatment or other patient management decisions. A negative result may occur with  improper specimen collection/handling, submission of specimen other than nasopharyngeal swab, presence of viral mutation(s) within the areas targeted by this assay, and inadequate number of viral copies(<138 copies/mL). A negative result must be combined with clinical observations, patient history, and epidemiological information. The expected result is Negative.  Fact Sheet for Patients:  EntrepreneurPulse.com.au  Fact Sheet for Healthcare Providers:  IncredibleEmployment.be  This test is no t yet approved or cleared by the [place] FDA and  has been authorized for detection and/or diagnosis of SARS-CoV-2 by FDA under an Emergency Use Authorization (EUA). This EUA will remain  in effect (meaning this test can be used) for the duration of the COVID-19 declaration under Section 564(b)(1) of the Act, 21 U.S.C.section 360bbb-3(b)(1), unless the authorization is terminated  or revoked sooner.       Influenza A by PCR NEGATIVE NEGATIVE Final   Influenza B by PCR NEGATIVE NEGATIVE Final    Comment: (NOTE) The Xpert  Xpress SARS-CoV-2/FLU/RSV plus  assay is intended as an aid in the diagnosis of influenza from Nasopharyngeal swab specimens and should not be used as a sole basis for treatment. Nasal washings and aspirates are unacceptable for Xpert Xpress SARS-CoV-2/FLU/RSV testing.  Fact Sheet for Patients: EntrepreneurPulse.com.au  Fact Sheet for Healthcare Providers: IncredibleEmployment.be  This test is not yet approved or cleared by the [place] FDA and has been authorized for detection and/or diagnosis of SARS-CoV-2 by FDA under an Emergency Use Authorization (EUA). This EUA will remain in effect (meaning this test can be used) for the duration of the COVID-19 declaration under Section 564(b)(1) of the Act, 21 U.S.C. section 360bbb-3(b)(1), unless the authorization is terminated or revoked.  Performed at [facility], [place] [address], [place], [place] [address]     Please note: You were cared for by a hospitalist during your hospital stay. Once you are discharged, your primary care physician will handle any further medical issues. Please note that NO REFILLS for any discharge medications will be authorized once you are discharged, as it is imperative that you return to your primary care physician (or establish a relationship with a primary care physician if you do not have one) for your post hospital discharge needs so that they can reassess your need for medications and monitor your lab values.    Time coordinating discharge: 40 minutes  SIGNED:   Shelly Coss, MD  [facility] 04/26/2022, 12:13 PM Pager [phone]  If 7PM-7AM, please contact night-coverage [email or web address] Password [email or web address]

## 2022-05-02 ENCOUNTER — Encounter: Payer: Self-pay | Admitting: Student in an Organized Health Care Education/Training Program

## 2022-05-02 ENCOUNTER — Ambulatory Visit
Payer: Medicare Other | Attending: Student in an Organized Health Care Education/Training Program | Admitting: Student in an Organized Health Care Education/Training Program

## 2022-05-02 VITALS — BP 105/63 | HR 114 | Temp 97.5°F | Resp 16 | Ht 67.0 in | Wt 201.0 lb

## 2022-05-02 DIAGNOSIS — G894 Chronic pain syndrome: Secondary | ICD-10-CM

## 2022-05-02 DIAGNOSIS — M47817 Spondylosis without myelopathy or radiculopathy, lumbosacral region: Secondary | ICD-10-CM | POA: Diagnosis not present

## 2022-05-02 DIAGNOSIS — M47816 Spondylosis without myelopathy or radiculopathy, lumbar region: Secondary | ICD-10-CM

## 2022-05-02 DIAGNOSIS — M4310 Spondylolisthesis, site unspecified: Secondary | ICD-10-CM | POA: Diagnosis not present

## 2022-05-02 DIAGNOSIS — M961 Postlaminectomy syndrome, not elsewhere classified: Secondary | ICD-10-CM

## 2022-05-02 NOTE — Progress Notes (Signed)
Safety precautions to be maintained throughout the outpatient stay will include: orient to surroundings, keep bed in low position, maintain call bell within reach at all times, provide assistance with transfer out of bed and ambulation.  

## 2022-05-02 NOTE — Patient Instructions (Addendum)
Preparing for Procedure with Sedation Instructions: Oral Intake: Do not eat or drink anything for at least 8 hours prior to your procedure. Transportation: Public transportation is not allowed. Bring an adult driver. The driver must be physically present in our waiting room before any procedure can be started. Physical Assistance: Bring an adult capable of physically assisting you, in the event you need help. Blood Pressure Medicine: Take your blood pressure medicine with a sip of water the morning of the procedure. Insulin: Take only  of your normal insulin dose. Preventing infections: Shower with an antibacterial soap the morning of your procedure. Build-up your immune system: Take 1000 mg of Vitamin C with every meal (3 times a day) the day prior to your procedure. Pregnancy: If you are pregnant, call and cancel the procedure. Sickness: If you have a cold, fever, or any active infections, call and cancel the procedure. Arrival: You must be in the facility at least 30 minutes prior to your scheduled procedure. Children: Do not bring children with you. Dress appropriately: Bring dark clothing that you would not mind if they get stained. Valuables: Do not bring any jewelry or valuables. Procedure appointments are reserved for interventional treatments only. No Prescription Refills. No medication changes will be discussed during procedure appointments. No disability issues will be discussed.  ______________________________________________________________________  Preparing for your procedure  During your procedure appointment there will be: No Prescription Refills. No disability issues to discussed. No medication changes or discussions.  Instructions: Food intake: Avoid eating anything solid for at least 8 hours prior to your procedure. Clear liquid intake: You may take clear liquids such as water up to 2 hours prior to your procedure. (No carbonated drinks. No soda.) Transportation:  Unless otherwise stated by your physician, bring a driver. Morning Medicines: Except for blood thinners, take all of your other morning medications with a sip of water. Make sure to take your heart and blood pressure medicines. If your blood pressure's lower number is above 100, the case will be rescheduled. Blood thinners: If you take a blood thinner, but were not instructed to stop it, call our office [phone] and ask to talk to a nurse. Not stopping a blood thinner prior to certain procedures could lead to serious complications. Diabetics on insulin: Notify the staff so that you can be scheduled 1st case in the morning. If your diabetes requires high dose insulin, take only  of your normal insulin dose the morning of the procedure and notify the staff that you have done so. Preventing infections: Shower with an antibacterial soap the morning of your procedure.  Build-up your immune system: Take 1000 mg of Vitamin C with every meal (3 times a day) the day prior to your procedure. Antibiotics: Inform the nursing staff if you are taking any antibiotics or if you have any conditions that may require antibiotics prior to procedures. (Example: recent joint implants)   Pregnancy: If you are pregnant make sure to notify the nursing staff. Not doing so may result in injury to the fetus, including death.  Sickness: If you have a cold, fever, or any active infections, call and cancel or reschedule your procedure. Receiving steroids while having an infection may result in complications. Arrival: You must be in the facility at least 30 minutes prior to your scheduled procedure. Tardiness: Your scheduled time is also the cutoff time. If you do not arrive at least 15 minutes prior to your procedure, you will be rescheduled.  Children: Do not bring any  children with you. Make arrangements to keep them home. Dress appropriately: There is always a possibility that your clothing may get soiled. Avoid long  dresses. Valuables: Do not bring any jewelry or valuables.  Reasons to call and reschedule or cancel your procedure: (Following these recommendations will minimize the risk of a serious complication.) Surgeries: Avoid having procedures within 2 weeks of any surgery. (Avoid for 2 weeks before or after any surgery). Flu Shots: Avoid having procedures within 2 weeks of a flu shots or . (Avoid for 2 weeks before or after immunizations). Barium: Avoid having a procedure within 7-10 days after having had a radiological study involving the use of radiological contrast. (Myelograms, Barium swallow or enema study). Heart attacks: Avoid any elective procedures or surgeries for the initial 6 months after a "Myocardial Infarction" (Heart Attack). Blood thinners: It is imperative that you stop these medications before procedures. Let us know if you if you take any blood thinner.  Infection: Avoid procedures during or within two weeks of an infection (including chest colds or gastrointestinal problems). Symptoms associated with infections include: Localized redness, fever, chills, night sweats or profuse sweating, burning sensation when voiding, cough, congestion, stuffiness, runny nose, sore throat, diarrhea, nausea, vomiting, cold or Flu symptoms, recent or current infections. It is specially important if the infection is over the area that we intend to treat. Heart and lung problems: Symptoms that may suggest an active cardiopulmonary problem include: cough, chest pain, breathing difficulties or shortness of breath, dizziness, ankle swelling, uncontrolled high or unusually low blood pressure, and/or palpitations. If you are experiencing any of these symptoms, cancel your procedure and contact your primary care physician for an evaluation.  Remember:  Regular Business hours are:  Monday to Thursday 8:00 AM to 4:00 PM  Provider's Schedule: Milinda Pointer, MD:  Procedure days: Tuesday and Thursday 7:30 AM to  4:00 PM  Gillis Santa, MD:  Procedure days: Monday and Wednesday 7:30 AM to 4:00 PM  ______________________________________________________________________  Radiofrequency Ablation Radiofrequency ablation is a procedure that is performed to relieve pain. The procedure is often used for back, neck, or arm pain. Radiofrequency ablation involves the use of a machine that creates radio waves to make heat. During the procedure, the heat is applied to the nerve that carries the pain signal. The heat damages the nerve and interferes with the pain signal. Pain relief usually starts about 2 weeks after the procedure and lasts for 6 months to 1 year. Tell a health care provider about: Any allergies you have. All medicines you are taking, including vitamins, herbs, eye drops, creams, and over-the-counter medicines. Any problems you or family members have had with anesthetic medicines. Any bleeding problems you have. Any surgeries you have had. Any medical conditions you have. Whether you are pregnant or may be pregnant. What are the risks? Generally, this is a safe procedure. However, problems may occur, including: Pain or soreness at the injection site. Allergic reaction to medicines given during the procedure. Bleeding. Infection at the injection site. Damage to nerves or blood vessels. What happens before the procedure? When to stop eating and drinking Follow instructions from your health care provider about what you may eat and drink before your procedure. These may include: 8 hours before the procedure Stop eating most foods. Do not eat meat, fried foods, or fatty foods. Eat only light foods, such as toast or crackers. All liquids are okay except energy drinks and alcohol. 6 hours before the procedure Stop eating. Drink only clear  liquids, such as water, clear fruit juice, black coffee, plain tea, and sports drinks. Do not drink energy drinks or alcohol. 2 hours before the  procedure Stop drinking all liquids. You may be allowed to take medicine with small sips of water. If you do not follow your health care provider's instructions, your procedure may be delayed or canceled. Medicines Ask your health care provider about: Changing or stopping your regular medicines. This is especially important if you are taking diabetes medicines or blood thinners. Taking medicines such as aspirin and ibuprofen. These medicines can thin your blood. Do not take these medicines unless your health care provider tells you to take them. Taking over-the-counter medicines, vitamins, herbs, and supplements. General instructions Ask your health care provider what steps will be taken to help prevent infection. These steps may include: Removing hair at the procedure site. Washing skin with a germ-killing soap. Taking antibiotic medicine. If you will be going home right after the procedure, plan to have a responsible adult: Take you home from the hospital or clinic. You will not be allowed to drive. Care for you for the time you are told. What happens during the procedure?  You will be awake during the procedure. You will need to be able to talk with the health care provider during the procedure. An IV will be inserted into one of your veins. You will be given one or more of the following: A medicine to help you relax (sedative). A medicine to numb the area (local anesthetic). Your health care provider will insert a radiofrequency needle into the area to be treated. This is done with the help of fluoroscopy. A wire that carries the radio waves (electrode) will be put through the radiofrequency needle. An electrical pulse will be sent through the electrode to verify the correct nerve that is causing your pain. You will feel a tingling sensation, and you may have muscle twitching. The tissue around the needle tip will be heated by an electric current that comes from the radiofrequency  machine. This will numb the nerves. The needle will be removed. A bandage (dressing) will be put on the insertion area. The procedure may vary among health care providers and hospitals. What happens after the procedure? Your blood pressure, heart rate, breathing rate, and blood oxygen level will be monitored until you leave the hospital or clinic. Return to your normal activities as told by your health care provider. Ask your health care provider what activities are safe for you. If you were given a sedative during the procedure, it can affect you for several hours. Do not drive or operate machinery until your health care provider says that it is safe. Summary Radiofrequency ablation is a procedure that is performed to relieve pain. The procedure is often used for back, neck, or arm pain. Radiofrequency ablation involves the use of a machine that creates radio waves to make heat. Plan to have a responsible adult take you home from the hospital or clinic. Do not drive or operate machinery until your health care provider says that it is safe. Return to your normal activities as told by your health care provider. Ask your health care provider what activities are safe for you. This information is not intended to replace advice given to you by your health care provider. Make sure you discuss any questions you have with your health care provider. Document Revised: 11/10/2020 Document Reviewed: 11/10/2020 [employer] Patient Education  [employer].

## 2022-05-02 NOTE — Progress Notes (Signed)
PROVIDER NOTE: Information contained herein reflects review and annotations entered in association with encounter. Interpretation of such information and data should be left to medically-trained personnel. Information provided to patient can be located elsewhere in the medical record under "Patient Instructions". Document created using STT-dictation technology, any transcriptional errors that may result from process are unintentional.    Patient: Brandy [name]  Service Category: E/M  Provider: Gillis Santa, MD  DOB: [birth year]  DOS: 05/02/2022  Referring Provider: Sandrea Hughs, NP  MRN: [id number]  Specialty: Interventional Pain Management  PCP: Sandrea Hughs, NP  Type: Established Patient  Setting: Ambulatory outpatient    Location: Office  Delivery: Face-to-face     HPI  Brandy [name], a 56 y.o. year old female, is here today because of her Lumbar facet joint syndrome [M47.816]. Brandy [name] primary complain today is Back Pain Last encounter: My last encounter with her was on 03/14/2022. Pertinent problems: Brandy [name] has Spinal stenosis, lumbar region with neurogenic claudication; Post laminectomy syndrome; CKD (chronic kidney disease), stage III ([place]); Depression; Chronic pain syndrome; DDD (degenerative disc disease), lumbosacral; Grade 1 Retrolisthesis (L4-5, L5-S1); Lumbar facet syndrome (Multilevel) (Bilateral) (R>L); Neurogenic pain; and Spondylosis without myelopathy or radiculopathy, lumbosacral region on their pertinent problem list. Pain Assessment: Severity of Chronic pain is reported as a 0-No pain/10. Location: Back Right, Left, Lower/denies today. Onset: More than a month ago. Quality: Aching, Dull, Discomfort. Timing: Intermittent. Modifying factor(s): procedure, getting up and moving or rest. Vitals:  height is _0  (1.702 m) and weight is 201 lb (91.2 kg). Her temperature is 97.5 F (36.4 C) (abnormal). Her blood  pressure is 105/63 and her pulse is 114 (abnormal). Her respiration is 16 and oxygen saturation is 100%.   Reason for encounter: post-procedure evaluation and assessment.  Unfortunately, the patient was hospitalized for hypokalemia and hypomagnesemia.  That has since improved.  This was secondary to drug side effect, a cardiovascular drug that she was on. She presents today to review postprocedural assessment after her bilateral L2, L3, L4, L5 medial branch nerve block #2 We discussed lumbar radiofrequency ablation for the purpose of obtaining longer-term pain relief.  Risk and benefits reviewed and patient would like to proceed with that.  Post-procedure evaluation   Type: Lumbar Facet, Medial Branch Block(s) #2  Primary Purpose: Therapeutic Region: Posterolateral Lumbosacral Spine Level: L2, L3, L4, L5, Medial Branch Level(s). Injecting these levels blocks the L2-3, L3-4, L4-5, lumbar facet joints. Laterality: Bilateral  Effectiveness:  Initial hour after procedure: 98 %  Subsequent 4-6 hours post-procedure: 95 %  Analgesia past initial 6 hours: 95 % (Currently)  Ongoing improvement:  Analgesic:  90% Function: Somewhat improved ROM: Somewhat improved   ROS  Constitutional: Denies any fever or chills Gastrointestinal: No reported hemesis, hematochezia, vomiting, or acute GI distress Musculoskeletal:  Mild low back pain Neurological: No reported episodes of acute onset apraxia, aphasia, dysarthria, agnosia, amnesia, paralysis, loss of coordination, or loss of consciousness  Medication Review  ALPRAZolam, Buprenorphine HCl, Carboxymethylcellul-Glycerin, Lifitegrast, Lubricant Eye Nighttime, Magnesium Oxide, Vitamin D (Ergocalciferol), abemaciclib, acetaminophen, albuterol, amitriptyline, diphenoxylate-atropine, fluticasone-salmeterol, ketorolac, loperamide, methocarbamol, midodrine, multivitamin with minerals, omeprazole, potassium chloride SA, rosuvastatin, and triamcinolone  cream  History Review  Allergy: Brandy [name] is allergic to zithromax [azithromycin], tramadol, and psyllium. Drug: Brandy [name]  reports no history of drug use. Alcohol:  reports no history of alcohol use. Tobacco:  reports that she quit smoking about 4 years ago. Her smoking  use included cigarettes. She has a 6.25 pack-year smoking history. She has never used smokeless tobacco. Social: Brandy [name]  reports that she quit smoking about 4 years ago. Her smoking use included cigarettes. She has a 6.25 pack-year smoking history. She has never used smokeless tobacco. She reports that she does not drink alcohol and does not use drugs. Medical:  has a past medical history of Acute pansinusitis (08/02/2017), Anxiety, Arthritis, ASD (atrial septal defect), Cancer ([place]), Cataract, Dyspnea, Fatty liver (09/04/2019), GERD (gastroesophageal reflux disease), Heart murmur, History of hiatal hernia, Hyperlipidemia, Legally blind in right eye, as defined in [place], Lumbar herniated disc, PONV (postoperative nausea and vomiting), and Sciatica. Surgical: Brandy [name]  has a past surgical history that includes Breast surgery (Bilateral, 2011); Cataract extraction; Bunionectomy; Cleft palate repair; Cardiac surgery; Ablation; transthoracic echocardiogram; Cardiac catheterization; Lumbar laminectomy/decompression microdiscectomy (Left, 05/23/2016); Lumbar laminectomy/decompression microdiscectomy (Left, 05/23/2016); Shoulder injection (Left, 05/23/2016); Reduction mammaplasty (2011); Eye surgery (Right, 2019); Tubal ligation; and Breast lumpectomy with radioactive seed and sentinel lymph node biopsy (Right, 11/23/2020). Family: family history includes Arthritis in her mother; Cancer in her father; Diabetes in her mother; High blood pressure in her mother; Hypertension in her mother.  Laboratory Chemistry Profile   Renal Lab Results  Component Value Date   BUN 8 04/26/2022    CREATININE 1.12 (H) 04/26/2022   LABCREA 123.4 10/27/2021   BCR 6 03/22/2022   GFRAA >60 02/24/2020   GFRNONAA 57 (L) 04/26/2022    Hepatic Lab Results  Component Value Date   AST 42 (H) 04/25/2022   ALT 28 04/25/2022   ALBUMIN 2.8 (L) 04/25/2022   ALKPHOS 113 04/25/2022   LIPASE 35 07/12/2021    Electrolytes Lab Results  Component Value Date   NA 140 04/26/2022   K 3.1 (L) 04/26/2022   CL 97 (L) 04/26/2022   CALCIUM 7.9 (L) 04/26/2022   MG 1.8 04/26/2022   PHOS 2.6 04/26/2022    Bone Lab Results  Component Value Date   VD25OH 30.51 06/26/2019   VD125OH2TOT 62 05/28/2021   [id number] <8 05/28/2021   VD2125OH2 62 05/28/2021   25OHVITD1 50 11/27/2019   25OHVITD2 46 11/27/2019   25OHVITD3 4.1 11/27/2019    Inflammation (CRP: Acute Phase) (ESR: Chronic Phase) Lab Results  Component Value Date   CRP 16 (H) 11/27/2019   ESRSEDRATE 22 11/27/2019   LATICACIDVEN 2.2 ([place]) 04/21/2020         Note: Above Lab results reviewed.  Recent Imaging Review  NM Pulmonary Perf and Vent CLINICAL DATA:  Elevated D-dimer, question pulmonary embolism  EXAM: NUCLEAR MEDICINE PERFUSION LUNG SCAN  TECHNIQUE: Perfusion images were obtained in multiple projections after intravenous injection of radiopharmaceutical.  Ventilation scans intentionally deferred if perfusion scan and chest x-ray adequate for interpretation during COVID 19 epidemic.  RADIOPHARMACEUTICALS:  4.4 mCi Tc-15mMAA IV  COMPARISON:  Chest radiograph 04/24/2022  FINDINGS: Suspected rounded artifact at upper RIGHT lung on LAO view, not seen on remaining views.  Otherwise normal perfusion lung scan.  No segmental perfusion defects.  IMPRESSION: Pulmonary embolism absent.  Electronically Signed   By: MLavonia DanaM.D.   On: 04/25/2022 13:23  MR Lumbar Spine w/o contrast   Narrative CLINICAL DATA:  Spinal stenosis, lumbar. Low back pain radiating to bilateral sides/legs with numbness and weakness in  bilateral legs for years. History of lumbar spine laminectomy in 2017.   EXAM: MRI LUMBAR SPINE WITHOUT CONTRAST   TECHNIQUE: Multiplanar, multisequence MR imaging of the lumbar spine  was performed. No intravenous contrast was administered.   COMPARISON:  MR lumbar 01/13/2017; X-ray lumbar 08/26/2021.   FINDINGS: Segmentation:  5 lumbar vertebra   Alignment: 4 mm anterolisthesis L2-3 is unchanged. 2 mm anterolisthesis L3-4 unchanged. 4 mm retrolisthesis L4-5 and L5-S1 also unchanged.   Vertebrae: Negative for fracture or mass. Normal appearing bone marrow.   Conus medullaris and cauda equina: Conus extends to the L2-3 level. Conus and cauda equina appear normal.   Paraspinal and other soft tissues: Negative for paraspinous mass, adenopathy, fluid collection   Disc levels:   L1-2: Minimal disc degeneration. Moderate to advanced facet degeneration. Interval development of posterior epidural synovial cyst measuring 7 x 8 mm. This flattens the posterior thecal sac but is not causing significant spinal stenosis. Generous size spinal canal.   L2-3: Anterolisthesis. Moderate to advanced facet degeneration bilaterally. No significant stenosis   L3-4: Mild anterolisthesis. Shallow right foraminal disc protrusion unchanged from the prior study. Mild disc degeneration and moderate facet degeneration. Negative for stenosis.   L4-5: Left laminotomy. Diffuse bulging of the disc and mild facet degeneration. Mild left subarticular stenosis, unchanged. Spinal canal adequate in size   L5-S1: Left laminotomy. Retrolisthesis with disc degeneration and spurring. Bilateral facet degeneration. Mild subarticular stenosis bilaterally. No interval change.   IMPRESSION: Postop laminectomy left L4-5 and L5-S1. No recurrent disc protrusion. Subarticular stenosis left L4-5 unchanged. Subarticular stenosis bilaterally L5-S1 unchanged   Multilevel facet degeneration. Interval development of 7  x 8 mm synovial cyst at L1-2 projecting into the posterior epidural space in the midline. This is not causing significant spinal stenosis.     Electronically Signed By: Franchot Gallo M.D. On: 09/29/2021 10:49   MR Lumbar Spine W Wo Contrast   Narrative CLINICAL DATA:  Intermittent back pain for 5 years, bilateral lower extremity burning and numbness. Follow-up radiculopathy.   EXAM: MRI LUMBAR SPINE WITHOUT AND WITH CONTRAST   TECHNIQUE: Multiplanar and multiecho pulse sequences of the lumbar spine were obtained without and with intravenous contrast.   CONTRAST:  14m MULTIHANCE GADOBENATE DIMEGLUMINE 529 MG/ML IV SOLN   COMPARISON:  MRI of the lumbar spine March 06, 2016 and lumbar spine radiographs August 25, 2016   FINDINGS: SEGMENTATION: For the purposes of this report, the last well-formed intervertebral disc will be reported as L5-S1.   ALIGNMENT: Straightened lumbar lordosis. Minimal grade 1 L2-3 anterolisthesis. Minimal grade 1 L4-5 retrolisthesis. Grade 1 L5-S1 retrolisthesis.   VERTEBRAE:Vertebral bodies are intact. Moderate to severe L5-S1 disc height loss, progressed from prior MRI. Mild desiccation lower lumbar discs. Mild chronic discogenic endplate changes LY8-F0 Similar low T1, bright STIR signal single segment upper coccyx, without definite enhancement though, not tailored for evaluation. No abnormal or acute lumbar spine bone marrow signal or enhancement. No abnormal disc enhancement.   CONUS MEDULLARIS: Conus medullaris terminates at L1-2 and demonstrates normal morphology and signal characteristics. Cauda equina is normal. No abnormal cord, leptomeningeal or epidural enhancement.   PARASPINAL AND SOFT TISSUES: Included prevertebral and paraspinal soft tissues are nonacute. Moderate LEFT sacral paraspinal denervation/atrophy.   DISC LEVELS:   T12-L1, L1-2: No disc bulge, canal stenosis nor neural foraminal narrowing. Mild to moderate facet  arthropathy.   L2-3: Anterolisthesis. Unroofing of the disc without disc bulge. 4 mm RIGHT facet synovial cyst decreased from 9 mm, decreased fluid component. Moderate to severe RIGHT, moderate LEFT facet arthropathy and ligamentum flavum redundancy without canal stenosis or neural foraminal narrowing.   L3-4: Small broad-based RIGHT extraforaminal disc protrusion unchanged.  Moderate to severe facet arthropathy without canal stenosis or neural foraminal narrowing.   L4-5: Retrolisthesis. LEFT hemilaminectomy. Homogeneous enhancing granulation tissue within the surgical bed, no fluid collection. Small LEFT subarticular disc protrusion and annular fissure persists. No canal stenosis. Mild LEFT neural foraminal narrowing.   L5-S1: Bilateral L5-S1 laminectomies. Homogeneous enhancing granulation tissue within the surgical bed, no focal fluid collection. Mild facet arthropathy. No canal stenosis. Mild LEFT neural foraminal narrowing.   IMPRESSION: 1. Status post LEFT L4-5 and bilateral L5-S1 laminectomies. 2. Grade 1 L2-3 anterolisthesis, grade 1 L4-5 retrolisthesis, grade 1 L5-S1 retrolisthesis. 3. No canal stenosis. Mild LEFT L4-5 and L5-S1 neural foraminal narrowing. 4. Similar abnormal upper coccyx bone marrow signal, nonspecific and incompletely characterized.     Electronically Signed By: Elon Alas M.D. On: 01/14/2017 00:23   DG Lumbar Spine 1 View   Narrative CLINICAL DATA:  Left L4-5 decompression.   EXAM: LUMBAR SPINE - 1 VIEW   COMPARISON:  05/23/2016 intraoperative lumbar spine radiographs .   FINDINGS: There is a posterior approach surgical marking device with the tip overlying the spinal canal at the level of the L5-S1 disc. Additional surgical instruments overlie the posterior lower back soft tissues at the L4-S1 levels.   IMPRESSION: Posterior approach surgical marking device position as described.     Electronically Signed By: Ilona Sorrel  M.D. On: 05/23/2016 10:57  Note: Reviewed        Physical Exam  General appearance: Well nourished, well developed, and well hydrated. In no apparent acute distress Mental status: Alert, oriented x 3 (person, place, & time)       Respiratory: No evidence of acute respiratory distress Eyes: PERLA Vitals: BP 105/63   Pulse (!) 114   Temp (!) 97.5 F (36.4 C)   Resp 16   Ht _0  (1.702 m)   Wt 201 lb (91.2 kg)   SpO2 100%   BMI 31.48 kg/m  BMI: Estimated body mass index is 31.48 kg/m as calculated from the following:   Height as of this encounter: _1  (1.702 m).   Weight as of this encounter: 201 lb (91.2 kg). Ideal: Ideal body weight: 61.6 kg (135 lb 12.9 oz) Adjusted ideal body weight: 73.4 kg (161 lb 14.1 oz)  Lumbar Spine Area Exam  Skin & Axial Inspection: Well healed scar from previous spine surgery detected Alignment: Symmetrical Functional ROM: Pain restricted ROM       Stability: No instability detected Muscle Tone/Strength: Functionally intact. No obvious neuro-muscular anomalies detected. Sensory (Neurological): d facet mediated Palpation: No palpable anomalies         Positive pain with lumbar extension and facet loading.   Ambulation: Unassisted Gait: Antalgic Posture: Difficulty standing up straight, due to pain  Lower Extremity Exam      Side: Right lower extremity   Side: Left lower extremity  Stability: No instability observed           Stability: No instability observed          Skin & Extremity Inspection: Skin color, temperature, and hair growth are WNL. No peripheral edema or cyanosis. No masses, redness, swelling, asymmetry, or associated skin lesions. No contractures.   Skin & Extremity Inspection: Skin color, temperature, and hair growth are WNL. No peripheral edema or cyanosis. No masses, redness, swelling, asymmetry, or associated skin lesions. No contractures.  Functional ROM: Pain restricted ROM for hip and knee joints           Functional  ROM:  Pain restricted ROM for hip and knee joints          Muscle Tone/Strength: Functionally intact. No obvious neuro-muscular anomalies detected.   Muscle Tone/Strength: Functionally intact. No obvious neuro-muscular anomalies detected.  Sensory (Neurological): Unimpaired         Sensory (Neurological): Unimpaired        DTR: Patellar: deferred today Achilles: deferred today Plantar: deferred today   DTR: Patellar: deferred today Achilles: deferred today Plantar: deferred today  Palpation: No palpable anomalies   Palpation: No palpable anomalies     Assessment   Diagnosis Status  1. Lumbar facet syndrome (Multilevel) (Bilateral) (R>L)   2. Spondylosis without myelopathy or radiculopathy, lumbosacral region   3. Post laminectomy syndrome   4. Grade 1 Retrolisthesis (L4-5, L5-S1)   5. Failed back surgical syndrome   6. Chronic pain syndrome    Responding Controlled Controlled     Plan of Care  Status post 2 positive diagnostic lumbar facet medial branch nerve blocks.  Discussed lumbar radiofrequency ablation for the purpose of obtaining longer-term pain relief.  Risk and benefits reviewed and patient like to start with the right side first.   Orders:  Orders Placed This Encounter  Procedures   Radiofrequency,Lumbar    Standing Status:   Future    Standing Expiration Date:   08/02/2022    Scheduling Instructions:     Side(s): RIGHT     Level(s): L2, L3, L4, L5, Medial Branch Nerve(s)     Sedation: IV Versed     Scheduling Timeframe: As soon as pre-approved    Order Specific Question:   Where will this procedure be performed?    Answer:   [facility]   Radiofrequency,Lumbar    Standing Status:   Future    Standing Expiration Date:   08/02/2022    Scheduling Instructions:     Side(s): LEFT     Level(s): L2, L3, L4, L5, Medial Branch Nerve(s)     Sedation: IV Versed     Scheduling Timeframe: 2 weeks after right    Order Specific Question:   Where will this  procedure be performed?    Answer:   [facility]   Follow-up plan:   Return in about 1 month (around 06/01/2022) for  Right L3, 4, 5 RFA , in clinic IV Versed.     Interventional management options:  Considering:   Lumbar RFA SCS trial Sprint PNS medial branch Diagnostic caudal ESI  Diagnostic left sacroiliac joint block  Diagnostic right IA hip joint injection      Recent Visits Date Type Provider Dept  03/14/22 Procedure visit Gillis Santa, MD [facility]  03/08/22 Office Visit Gillis Santa, MD [facility]  Showing recent visits within past 90 days and meeting all other requirements Today's Visits Date Type Provider Dept  05/02/22 Office Visit Gillis Santa, MD [facility]  Showing today's visits and meeting all other requirements Future Appointments Date Type Provider Dept  06/02/22 Appointment Gillis Santa, MD [facility]  Showing future appointments within next 90 days and meeting all other requirements  I discussed the assessment and treatment plan with the patient. The patient was provided an opportunity to ask questions and all were answered. The patient agreed with the plan and demonstrated an understanding of the instructions.  Patient advised to call back or seek an in-person evaluation if the symptoms or condition worsens.  Duration of encounter: 40mnutes.  Total time on encounter, as per [facility]  guidelines included both the face-to-face and non-face-to-face time personally spent by the physician and/or other qualified health care professional(s) on the day of the encounter (includes time in activities that require the physician or other qualified health care professional and does not include time in activities normally performed by clinical staff). Physician's time may include the following activities when performed: preparing to see the patient (eg, review of tests, pre-charting review of records) obtaining and/or  reviewing separately obtained history performing a medically appropriate examination and/or evaluation counseling and educating the patient/family/caregiver ordering medications, tests, or procedures referring and communicating with other health care professionals (when not separately reported) documenting clinical information in the electronic or other health record independently interpreting results (not separately reported) and communicating results to the patient/ family/caregiver care coordination (not separately reported)  Note by: Gillis Santa, MD Date: 05/02/2022; Time: 2:06 PM

## 2022-05-03 ENCOUNTER — Other Ambulatory Visit: Payer: Self-pay

## 2022-05-03 DIAGNOSIS — E782 Mixed hyperlipidemia: Secondary | ICD-10-CM

## 2022-05-03 DIAGNOSIS — I10 Essential (primary) hypertension: Secondary | ICD-10-CM

## 2022-05-04 ENCOUNTER — Other Ambulatory Visit: Payer: Self-pay | Admitting: Hematology

## 2022-05-04 DIAGNOSIS — C50919 Malignant neoplasm of unspecified site of unspecified female breast: Secondary | ICD-10-CM

## 2022-05-06 ENCOUNTER — Encounter: Payer: Self-pay | Admitting: Cardiology

## 2022-05-06 ENCOUNTER — Ambulatory Visit: Payer: Medicare Other | Attending: Cardiology | Admitting: Cardiology

## 2022-05-06 VITALS — BP 120/76 | HR 98 | Ht 67.5 in | Wt 196.2 lb

## 2022-05-06 DIAGNOSIS — Q2111 Secundum atrial septal defect: Secondary | ICD-10-CM

## 2022-05-06 DIAGNOSIS — Z79899 Other long term (current) drug therapy: Secondary | ICD-10-CM | POA: Diagnosis not present

## 2022-05-06 DIAGNOSIS — E782 Mixed hyperlipidemia: Secondary | ICD-10-CM

## 2022-05-06 DIAGNOSIS — I951 Orthostatic hypotension: Secondary | ICD-10-CM

## 2022-05-06 DIAGNOSIS — J9611 Chronic respiratory failure with hypoxia: Secondary | ICD-10-CM | POA: Diagnosis not present

## 2022-05-06 NOTE — Progress Notes (Signed)
Cardiology Office Note:    Date:  05/08/2022   ID:  Brandy [name], DOB 09-05-1965, MRN [id number]  PCP:  Sandrea Hughs, NP  Cardiologist:  Berniece Salines, DO  Electrophysiologist:  None   Referring MD: Sandrea Hughs, NP   I am still having the passout feeling  History of Present Illness:    Brandy [name] is a 56 y.o. female with a hx of secundum ASD s/p repair in 2006, prior catheterization in 2013 with did not show any evidence of coronary artery disease, anxiety, lung cancer status post surgery is here today for follow-up visit.   I first saw the patient on June 03, 2021 at that time she was experiencing significant shortness of breath and chest discomfort.  Given her history I recommend the patient undergo a coronary CTA along with an echocardiogram.  All of her testing did not show any evidence of coronary artery disease or any significant structural abnormality.   At her last visit in February 2022 she was still experiencing some lightheadedness I started the patient on midodrine given her orthostatic hypotension.  I encouraged abdominal binder as well as compression socks.  She was experiencing some headache I referred the patient to neurology.  At her visit on November 04, 2021 her midodrine has been increased to 10 mg every 8 hours and she was tolerating this medication well.  At her visit in January 13, 2022 at that time she was positive for orthostatic I referred the patient to EP for possible loop recorder implantation.  Since I saw the patient she has has some problems and is now on oxygen for chronic respiratory failure. She was taken of the florinef. No syncope episodes.  Past Medical History:  Diagnosis Date   Acute pansinusitis 08/02/2017   Anxiety    Arthritis    ASD (atrial septal defect)    s/p closure with Amplatzer device 10/05/04 (Dr. Myriam Jacobson, [place]) 10/05/04   Cancer [facility])    Cataract    Dyspnea    Fatty liver  09/04/2019   GERD (gastroesophageal reflux disease)    Heart murmur    no longer heard   History of hiatal hernia    Hyperlipidemia    Legally blind in right eye, as defined in [place]    Lumbar herniated disc    PONV (postoperative nausea and vomiting)    Sciatica     Past Surgical History:  Procedure Laterality Date   ABLATION     BREAST LUMPECTOMY WITH RADIOACTIVE SEED AND SENTINEL LYMPH NODE BIOPSY Right 11/23/2020   Procedure: RIGHT BREAST LUMPECTOMY WITH RADIOACTIVE SEED AND RIGHT AXILLARY SENTINEL LYMPH NODE BIOPSY;  Surgeon: Rolm Bookbinder, MD;  Location: [place];  Service: General;  Laterality: Right;   BREAST SURGERY Bilateral 2011   Breast Reduction Surgery   BUNIONECTOMY     CARDIAC CATHETERIZATION     10/05/04 [facility]): LM < 25%, otherwise normal coronaries. No pulmonary HTN, Mildly enlarged RV. Secundum ASD s/p closure.   CARDIAC SURGERY     CATARACT EXTRACTION     CLEFT PALATE REPAIR     s/p cleft lip and palate repair   EYE SURGERY Right 2019   right eye removed   LUMBAR LAMINECTOMY/DECOMPRESSION MICRODISCECTOMY Left 05/23/2016   Procedure: LEFT L4-L5 LATERAL RECESS DECOMPRESSION WITH CENTRAL AND RIGHT DECOMPRESSION VIA LEFT SIDE;  Surgeon: [name], MD;  Location: [place];  Service: Orthopedics;  Laterality: Left;   LUMBAR LAMINECTOMY/DECOMPRESSION MICRODISCECTOMY Left 05/23/2016  Procedure: LUMBAR LAMINECTOMY/DECOMPRESSION MICRODISCECTOMY Lumbar five - Sacral One 1 LEVEL;  Surgeon: [name], MD;  Location: [place];  Service: Orthopedics;  Laterality: Left;   REDUCTION MAMMAPLASTY  2011   SHOULDER INJECTION Left 05/23/2016   Procedure: SHOULDER INJECTION;  Surgeon: [name], MD;  Location: [place];  Service: Orthopedics;  Laterality: Left;  band-aid per pa-c   TRANSTHORACIC ECHOCARDIOGRAM     12/15/05 [facility]): Mild LVH, EF > 37%, grade 1 diastolic dysfunction, Trivial MR/PR/TR.   TUBAL LIGATION      Current Medications: Current Meds  Medication Sig    acetaminophen (TYLENOL) 500 MG tablet Take 500 mg by mouth daily as needed for moderate pain.   albuterol (PROVENTIL) (2.5 MG/3ML) 0.083% nebulizer solution Take 3 mLs (2.5 mg total) by nebulization every 6 (six) hours as needed for wheezing or shortness of breath. (Patient taking differently: Take 2.5 mg by nebulization as needed for wheezing or shortness of breath.)   ALPRAZolam (XANAX) 1 MG tablet TAKE 1 TABLET BY MOUTH AT  BEDTIME (Patient taking differently: Take 1 mg by mouth at bedtime.)   amitriptyline (ELAVIL) 75 MG tablet Take 75 mg by mouth at bedtime.   BELBUCA 150 MCG FILM Take 150 mcg by mouth every 12 (twelve) hours.   Carboxymethylcellul-Glycerin (LUBRICATING EYE DROPS OP) Place 1 drop into the right eye in the morning, at noon, and at bedtime.   diphenoxylate-atropine (LOMOTIL) 2.5-0.025 MG tablet Take 1 tablet by mouth 3 (three) times daily as needed for diarrhea or loose stools.   fluticasone-salmeterol (WIXELA INHUB) 250-50 MCG/ACT AEPB Inhale 1 puff into the lungs in the morning and at bedtime.   ketorolac (ACULAR) 0.5 % ophthalmic solution Place 1 drop into the left eye 3 (three) times daily.   loperamide (IMODIUM) 2 MG capsule Take 1-2 capsules (2-4 mg total) by mouth 4 (four) times daily as needed for diarrhea or loose stools. (Patient taking differently: Take 4 mg by mouth as needed for diarrhea or loose stools.)   Magnesium Oxide 400 MG CAPS Take 1 capsule (400 mg total) by mouth daily.   methocarbamol (ROBAXIN) 500 MG tablet Take 1 tablet (500 mg total) by mouth every 6 (six) hours as needed for muscle spasms. (Patient taking differently: Take 500 mg by mouth 3 (three) times daily as needed for muscle spasms.)   midodrine (PROAMATINE) 10 MG tablet Take 1 tablet (10 mg total) by mouth 3 (three) times daily.   Multiple Vitamin (MULTIVITAMIN WITH MINERALS) TABS tablet Take 1 tablet by mouth daily.   omeprazole (PRILOSEC OTC) 20 MG tablet Take 20 mg by mouth daily.   potassium  chloride SA (KLOR-CON M) 20 MEQ tablet Take 2 tablets (40 mEq total) by mouth 2 (two) times daily for 7 days.   rosuvastatin (CRESTOR) 10 MG tablet TAKE 1 TABLET BY MOUTH DAILY (Patient taking differently: Take 10 mg by mouth at bedtime.)   triamcinolone cream (KENALOG) 0.1 % Apply 1 Application topically 2 (two) times daily.   VERZENIO 100 MG tablet TAKE 1 TABLET BY MOUTH TWICE DAILY   Vitamin D, Ergocalciferol, (DRISDOL) 1.25 MG (50000 UNIT) CAPS capsule TAKE 1 CAPSULE ONCE PER WEEK (Patient taking differently: Take 50,000 Units by mouth every 7 (seven) days.)   White Petrolatum-Mineral Oil (LUBRICANT EYE NIGHTTIME) OINT Place 1 drop into the left eye at bedtime as needed (dryness).   XIIDRA 5 % SOLN Place 1 drop into the left eye in the morning, at noon, and at bedtime.  Allergies:   Zithromax [azithromycin], Tramadol, and Psyllium   Social History   Socioeconomic History   Marital status: Married    Spouse name: Not on file   Number of children: 3   Years of education: Not on file   Highest education level: Not on file  Occupational History   Not on file  Tobacco Use   Smoking status: Former    Packs/day: 0.25    Years: 25.00    Total pack years: 6.25    Types: Cigarettes    Quit date: 02/04/2018    Years since quitting: 4.2   Smokeless tobacco: Never  Vaping Use   Vaping Use: Never used  Substance and Sexual Activity   Alcohol use: No   Drug use: No   Sexual activity: Not on file  Other Topics Concern   Not on file  Social History Narrative   Tobacco use, amount per day now: None.   Past tobacco use, amount per day: 1/4   How many years did you use tobacco: Intermittent x 20 years   Alcohol use (drinks per week): N/A   Diet: Plant Base   Do you drink/eat things with caffeine: Coffee, Tea, Soda.   Marital status:    Married                              What year were you married? 1999   Do you live in a house, apartment, assisted living, condo, trailer, etc.? House     Is it one or more stories? 2   How many persons live in your home? 6 adults, 5 children.   Do you have pets in your home?( please list) 1 Terrier   Highest Level of education completed? AAS   Current or past profession: LPN   Do you exercise?   A little                               Type and how often? Walk, stretches.    Do you have a living will? No   Do you have a DNR form?   No                                If not, do you want to discuss one?   Do you have signed POA/HPOA forms? No                       If so, please bring to you appointment      Do you have any difficulty bathing or dressing yourself? No   Do you have any difficulty preparing food or eating? No   Do you have any difficulty managing your medications? No   Do you have any difficulty managing your finances? No   Do you have any difficulty affording your medications? No.   Social Determinants of Health   Financial Resource Strain: Not on file  Food Insecurity: Food Insecurity Present (04/24/2022)   Hunger Vital Sign    Worried About Running Out of Food in the Last Year: Often true    Ran Out of Food in the Last Year: Often true  Transportation Needs: Unmet Transportation Needs (04/24/2022)   PRAPARE - Hydrologist (Medical): Yes    Lack of Transportation (Non-Medical):  Yes  Physical Activity: Not on file  Stress: Not on file  Social Connections: Not on file     Family History: The patient's family history includes Arthritis in her mother; Cancer in her father; Diabetes in her mother; High blood pressure in her mother; Hypertension in her mother. There is no history of Colon cancer, Colon polyps, Esophageal cancer, Rectal cancer, or Stomach cancer.  ROS:   Review of Systems  Constitution: Negative for decreased appetite, fever and weight gain.  HENT: Negative for congestion, ear discharge, hoarse voice and sore throat.   Eyes: Negative for discharge, redness, vision loss in right  eye and visual halos.  Cardiovascular: Negative for chest pain, dyspnea on exertion, leg swelling, orthopnea and palpitations.  Respiratory: Negative for cough, hemoptysis, shortness of breath and snoring.   Endocrine: Negative for heat intolerance and polyphagia.  Hematologic/Lymphatic: Negative for bleeding problem. Does not bruise/bleed easily.  Skin: Negative for flushing, nail changes, rash and suspicious lesions.  Musculoskeletal: Negative for arthritis, joint pain, muscle cramps, myalgias, neck pain and stiffness.  Gastrointestinal: Negative for abdominal pain, bowel incontinence, diarrhea and excessive appetite.  Genitourinary: Negative for decreased libido, genital sores and incomplete emptying.  Neurological: Negative for brief paralysis, focal weakness, headaches and loss of balance.  Psychiatric/Behavioral: Negative for altered mental status, depression and suicidal ideas.  Allergic/Immunologic: Negative for HIV exposure and persistent infections.    EKGs/Labs/Other Studies Reviewed:    The following studies were reviewed today:   EKG:  None today  TTE 06/03/2022 IMPRESSIONS   1. Left ventricular ejection fraction, by estimation, is 60 to 65%. The left ventricle has normal function. The left ventricle has no regional wall motion abnormalities. Left ventricular diastolic parameters were normal.   2. Right ventricular systolic function is normal. The right ventricular  size is normal.   3. The mitral valve is grossly normal. No evidence of mitral valve regurgitation. No evidence of mitral stenosis.   4. The aortic valve is tricuspid. Aortic valve regurgitation is not visualized. No aortic stenosis is present.   Conclusion(s)/Recommendation(s): Normal biventricular function without evidence of hemodynamically significant valvular heart disease.   FINDINGS   Left Ventricle: Left ventricular ejection fraction, by estimation, is 60 to 65%. The left ventricle has normal function.  The left ventricle has no regional wall motion abnormalities. Definity contrast agent was given IV to delineate the left ventricular  endocardial borders. The left ventricular internal cavity size was normal in size. There is no left ventricular hypertrophy. Left ventricular diastolic parameters were normal.   Right Ventricle: The right ventricular size is normal. No increase in  right ventricular wall thickness. Right ventricular systolic function is  normal.   Left Atrium: Left atrial size was normal in size.   Right Atrium: Right atrial size was normal in size.   Pericardium: Trivial pericardial effusion is present.   Mitral Valve: The mitral valve is grossly normal. No evidence of mitral  valve regurgitation. No evidence of mitral valve stenosis.   Tricuspid Valve: The tricuspid valve is grossly normal. Tricuspid valve  regurgitation is trivial. No evidence of tricuspid stenosis.   Aortic Valve: The aortic valve is tricuspid. Aortic valve regurgitation is  not visualized. No aortic stenosis is present.   Pulmonic Valve: The pulmonic valve was grossly normal. Pulmonic valve  regurgitation is trivial. No evidence of pulmonic stenosis.   Aorta: The aortic root and ascending aorta are structurally normal, with  no evidence of dilitation.   Venous: The right lower pulmonary  vein is normal. The inferior vena cava  was not well visualized.   IAS/Shunts: The atrial septum is grossly normal.        CCTA 06/17/2021 FINDINGS: Image quality: Excellent.   Noise artifact is: Limited.   Coronary Arteries:  Normal coronary origin.  Left dominance.   Left main: The left main is a large caliber vessel with a normal take off from the left coronary cusp that bifurcates to form a left anterior descending artery and a left circumflex artery. trifurcates into a LAD, LCX, and ramus intermedius. There is no plaque or stenosis.   Left anterior descending artery: The LAD is patent without  evidence of plaque or stenosis. 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 The LAD gives off 3 patent diagonal branches. The LAD is large and wraps around the apex to supply the distal 1/3 of the posterior interventricular groove.   Left circumflex artery: The LCX is non-dominant and patent with no evidence of plaque or stenosis. The LCX gives off 2 patent obtuse marginal branches.   Right coronary artery: The RCA is non-dominant with normal take off from the right coronary cusp. There is no evidence of plaque or stenosis.   Right Atrium: Right atrial size is within normal limits. An interatrial septal closure device is present without residual leak.   Right Ventricle: The right ventricular cavity is within normal limits.   Left Atrium: Left atrial size is normal in size with no left atrial appendage filling defect.   Left Ventricle: The ventricular cavity size is within normal limits. There are no stigmata of prior infarction. There is no abnormal filling defect.   Pulmonary arteries: Normal in size without proximal filling defect.   Pulmonary veins: Normal pulmonary venous drainage.   Pericardium: Normal thickness with no significant effusion or calcium present.   Cardiac valves: The aortic valve is trileaflet without significant calcification. The mitral valve is normal structure without significant calcification.   Aorta: Normal caliber with no significant disease.   Extra-cardiac findings: See attached radiology report for non-cardiac structures.   IMPRESSION: 1. Coronary calcium score of 0.   2. Normal coronary origin with left dominance.   3. Normal coronary arteries.   4. Mid LAD myocardial bridge (benign).   5. An interatrial septal closure device is present without residual leak.   RECOMMENDATIONS: 1. No evidence of CAD (0%). Consider non-atherosclerotic causes of chest pain.   Eleonore Chiquito, MD     Electronically Signed   By: Eleonore Chiquito M.D.   On: 06/17/2021 11:24    Addended by Geralynn Rile, MD on 06/17/2021 11:26 AM    Study Result   Narrative & Impression  EXAM: OVER-READ INTERPRETATION  CT CHEST   The following report is an over-read performed by radiologist Dr. Vinnie Langton of [facility] [place] on 06/17/2021. This over-read does not include interpretation of cardiac or coronary anatomy or pathology. The coronary calcium score/coronary CTA interpretation by the cardiologist is attached.   COMPARISON:  Chest CT 05/21/2020.   FINDINGS: Atherosclerotic calcifications in the thoracic aorta. Within the visualized portions of the thorax there are no suspicious appearing pulmonary nodules or masses, there is no acute consolidative airspace disease, no pleural effusions, no pneumothorax and no lymphadenopathy. Visualized portions of the upper abdomen are unremarkable. There are no aggressive appearing lytic or blastic lesions noted in the visualized portions of the skeleton.   IMPRESSION: 1.  Aortic Atherosclerosis (ICD10-I70.0).   Electronically Signed: By: Vinnie Langton  M.D. On: 06/17/2021 09:02      Recent Labs: 05/28/2021: TSH 0.91 05/06/2022: ALT 47; BUN 21; Creatinine, Ser 1.84; Hemoglobin 12.5; Magnesium 1.7; Platelets 288; Potassium 4.2; Sodium 137  Recent Lipid Panel    Component Value Date/Time   CHOL 166 10/26/2021 1101   TRIG 172 (H) 10/26/2021 1101   HDL 74 10/26/2021 1101   CHOLHDL 2.2 10/26/2021 1101   LDLCALC 67 10/26/2021 1101    Physical Exam:    VS:  BP 120/76   Pulse 98   Ht 5' 7.5" (1.715 m)   Wt 196 lb 3.2 oz (89 kg)   SpO2 99%   BMI 30.28 kg/m     Wt Readings from Last 3 Encounters:  05/06/22 196 lb 3.2 oz (89 kg)  05/02/22 201 lb (91.2 kg)  04/24/22 197 lb 12 oz (89.7 kg)     GEN: Well nourished, well developed in no acute distress HEENT: Normal NECK: No JVD; No carotid bruits LYMPHATICS: No lymphadenopathy CARDIAC: S1S2 noted,RRR, no  murmurs, rubs, gallops RESPIRATORY:  Clear to auscultation without rales, wheezing or rhonchi  ABDOMEN: Soft, non-tender, non-distended, +bowel sounds, no guarding. EXTREMITIES: No edema, No cyanosis, no clubbing MUSCULOSKELETAL:  No deformity  SKIN: Warm and dry NEUROLOGIC:  Alert and oriented x 3, non-focal PSYCHIATRIC:  Normal affect, good insight  ASSESSMENT:    1. Medication management   2. Secundum ASD   3. Orthostatic hypotension   4. Mixed hyperlipidemia   5. Chronic respiratory failure with hypoxia (HCC)     PLAN:     No medications changes today. From a cv standpoint she is stable. Continue cpap   The patient is in agreement with the above plan. The patient left the office in stable condition.  The patient will follow up in 9 months due to medication change.   Medication Adjustments/Labs and Tests Ordered: Current medicines are reviewed at length with the patient today.  Concerns regarding medicines are outlined above.  Orders Placed This Encounter  Procedures   Comprehensive metabolic panel   Magnesium   CBC   No orders of the defined types were placed in this encounter.   Patient Instructions  Medication Instructions:  Your physician recommends that you continue on your current medications as directed. Please refer to the Current Medication list given to you today.  *If you need a refill on your cardiac medications before your next appointment, please call your pharmacy*   Lab Work: Your physician recommends that you have the following labs drawn today: CMET, Magnesium and CBC If you have labs (blood work) drawn today and your tests are completely normal, you will receive your results only by: [place] (if you have MyChart) OR A paper copy in the mail If you have any lab test that is abnormal or we need to change your treatment, we will call you to review the results.   Testing/Procedures: NONE   Follow-Up: At [facility], you and  your health needs are our priority.  As part of our continuing mission to provide you with exceptional heart care, we have created designated Provider Care Teams.  These Care Teams include your primary Cardiologist (physician) and Advanced Practice Providers (APPs -  Physician Assistants and Nurse Practitioners) who all work together to provide you with the care you need, when you need it.  We recommend signing up for the patient portal called "MyChart".  Sign up information is provided on this After Visit Summary.  MyChart is used to connect  with patients for Virtual Visits (Telemedicine).  Patients are able to view lab/test results, encounter notes, upcoming appointments, etc.  Non-urgent messages can be sent to your provider as well.   To learn more about what you can do with MyChart, go to [email or web address].    Your next appointment:   9 month(s)  The format for your next appointment:   In Person  Provider:   Berniece Salines, DO     Adopting a Healthy Lifestyle.  Know what a healthy weight is for you (roughly BMI <25) and aim to maintain this   Aim for 7+ servings of fruits and vegetables daily   65-80+ fluid ounces of water or unsweet tea for healthy kidneys   Limit to max 1 drink of alcohol per day; avoid smoking/tobacco   Limit animal fats in diet for cholesterol and heart health - choose grass fed whenever available   Avoid highly processed foods, and foods high in saturated/trans fats   Aim for low stress - take time to unwind and care for your mental health   Aim for 150 min of moderate intensity exercise weekly for heart health, and weights twice weekly for bone health   Aim for 7-9 hours of sleep daily   When it comes to diets, agreement about the perfect plan isnt easy to find, even among the experts. Experts at the [place] developed an idea known as the Healthy Eating Plate. Just imagine a plate divided into logical, healthy portions.    The emphasis is on diet quality:   Load up on vegetables and fruits - one-half of your plate: Aim for color and variety, and remember that potatoes dont count.   Go for whole grains - one-quarter of your plate: Whole wheat, barley, wheat berries, quinoa, oats, brown rice, and foods made with them. If you want pasta, go with whole wheat pasta.   Protein power - one-quarter of your plate: Fish, chicken, beans, and nuts are all healthy, versatile protein sources. Limit red meat.   The diet, however, does go beyond the plate, offering a few other suggestions.   Use healthy plant oils, such as olive, canola, soy, corn, sunflower and peanut. Check the labels, and avoid partially hydrogenated oil, which have unhealthy trans fats.   If youre thirsty, drink water. Coffee and tea are good in moderation, but skip sugary drinks and limit milk and dairy products to one or two daily servings.   The type of carbohydrate in the diet is more important than the amount. Some sources of carbohydrates, such as vegetables, fruits, whole grains, and beans-are healthier than others.   Finally, stay active  Signed, Berniece Salines, DO  05/08/2022 9:25 PM    [facility]

## 2022-05-06 NOTE — Patient Instructions (Signed)
Medication Instructions:  Your physician recommends that you continue on your current medications as directed. Please refer to the Current Medication list given to you today.  *If you need a refill on your cardiac medications before your next appointment, please call your pharmacy*   Lab Work: Your physician recommends that you have the following labs drawn today: CMET, Magnesium and CBC If you have labs (blood work) drawn today and your tests are completely normal, you will receive your results only by: [place] (if you have MyChart) OR A paper copy in the mail If you have any lab test that is abnormal or we need to change your treatment, we will call you to review the results.   Testing/Procedures: NONE   Follow-Up: At [facility], you and your health needs are our priority.  As part of our continuing mission to provide you with exceptional heart care, we have created designated Provider Care Teams.  These Care Teams include your primary Cardiologist (physician) and Advanced Practice Providers (APPs -  Physician Assistants and Nurse Practitioners) who all work together to provide you with the care you need, when you need it.  We recommend signing up for the patient portal called "MyChart".  Sign up information is provided on this After Visit Summary.  MyChart is used to connect with patients for Virtual Visits (Telemedicine).  Patients are able to view lab/test results, encounter notes, upcoming appointments, etc.  Non-urgent messages can be sent to your provider as well.   To learn more about what you can do with MyChart, go to [email or web address].    Your next appointment:   9 month(s)  The format for your next appointment:   In Person  Provider:   Berniece Salines, DO

## 2022-05-07 LAB — COMPREHENSIVE METABOLIC PANEL
ALT: 47 IU/L — ABNORMAL HIGH (ref 0–32)
AST: 51 IU/L — ABNORMAL HIGH (ref 0–40)
Albumin/Globulin Ratio: 1.1 — ABNORMAL LOW (ref 1.2–2.2)
Albumin: 4 g/dL (ref 3.8–4.9)
Alkaline Phosphatase: 275 IU/L — ABNORMAL HIGH (ref 44–121)
BUN/Creatinine Ratio: 11 (ref 9–23)
BUN: 21 mg/dL (ref 6–24)
Bilirubin Total: 0.8 mg/dL (ref 0.0–1.2)
CO2: 29 mmol/L (ref 20–29)
Calcium: 10.4 mg/dL — ABNORMAL HIGH (ref 8.7–10.2)
Chloride: 94 mmol/L — ABNORMAL LOW (ref 96–106)
Creatinine, Ser: 1.84 mg/dL — ABNORMAL HIGH (ref 0.57–1.00)
Globulin, Total: 3.6 g/dL (ref 1.5–4.5)
Glucose: 144 mg/dL — ABNORMAL HIGH (ref 70–99)
Potassium: 4.2 mmol/L (ref 3.5–5.2)
Sodium: 137 mmol/L (ref 134–144)
Total Protein: 7.6 g/dL (ref 6.0–8.5)
eGFR: 32 mL/min/{1.73_m2} — ABNORMAL LOW (ref >59)

## 2022-05-07 LAB — CBC
Hematocrit: 37.2 % (ref 34.0–46.6)
Hemoglobin: 12.5 g/dL (ref 11.1–15.9)
MCH: 35.4 pg — ABNORMAL HIGH (ref 26.6–33.0)
MCHC: 33.6 g/dL (ref 31.5–35.7)
MCV: 105 fL — ABNORMAL HIGH (ref 79–97)
Platelets: 288 10*3/uL (ref 150–450)
RBC: 3.53 x10E6/uL — ABNORMAL LOW (ref 3.77–5.28)
RDW: 13.8 % (ref 11.7–15.4)
WBC: 5.4 10*3/uL (ref 3.4–10.8)

## 2022-05-07 LAB — MAGNESIUM: Magnesium: 1.7 mg/dL (ref 1.6–2.3)

## 2022-05-08 DIAGNOSIS — J9611 Chronic respiratory failure with hypoxia: Secondary | ICD-10-CM | POA: Insufficient documentation

## 2022-05-12 ENCOUNTER — Other Ambulatory Visit: Payer: Self-pay | Admitting: Hematology

## 2022-05-12 DIAGNOSIS — C50919 Malignant neoplasm of unspecified site of unspecified female breast: Secondary | ICD-10-CM

## 2022-05-12 DIAGNOSIS — G4733 Obstructive sleep apnea (adult) (pediatric): Secondary | ICD-10-CM | POA: Diagnosis not present

## 2022-05-13 ENCOUNTER — Encounter (HOSPITAL_COMMUNITY)
Admission: RE | Admit: 2022-05-13 | Discharge: 2022-05-13 | Disposition: A | Discharge status: Home / Self Care | Payer: Medicare Other | Source: Ambulatory Visit | Attending: Hematology | Admitting: Hematology

## 2022-05-13 DIAGNOSIS — C50919 Malignant neoplasm of unspecified site of unspecified female breast: Secondary | ICD-10-CM

## 2022-05-13 DIAGNOSIS — R918 Other nonspecific abnormal finding of lung field: Secondary | ICD-10-CM | POA: Diagnosis not present

## 2022-05-13 DIAGNOSIS — M48062 Spinal stenosis, lumbar region with neurogenic claudication: Secondary | ICD-10-CM

## 2022-05-13 LAB — GLUCOSE, CAPILLARY: Glucose-Capillary: 106 mg/dL — ABNORMAL HIGH (ref 70–99)

## 2022-05-13 MED ORDER — FLUDEOXYGLUCOSE F - 18 (FDG) INJECTION
10.0000 | Freq: Once | INTRAVENOUS | Status: AC | PRN
  Administered 2022-05-13: 9.8 via INTRAVENOUS

## 2022-05-16 ENCOUNTER — Other Ambulatory Visit: Payer: Self-pay | Admitting: Pulmonary Disease

## 2022-05-16 ENCOUNTER — Other Ambulatory Visit: Payer: Self-pay | Admitting: Family

## 2022-05-16 ENCOUNTER — Other Ambulatory Visit: Payer: Self-pay | Admitting: Adult Health

## 2022-05-16 DIAGNOSIS — C50919 Malignant neoplasm of unspecified site of unspecified female breast: Secondary | ICD-10-CM

## 2022-05-16 MED ORDER — ALPRAZOLAM 1 MG PO TABS: 1.0000 mg | ORAL_TABLET | Freq: Every day | ORAL | 3 refills | Status: DC

## 2022-05-16 NOTE — Telephone Encounter (Signed)
Refill request received from pharmacy. Medication last filled 04/17/2022 no refills and contract is up to date.  Medication pended and sent to Marlowe Sax, NP

## 2022-05-16 NOTE — Progress Notes (Signed)
Alprazolam script send to pharmacy.

## 2022-05-17 ENCOUNTER — Other Ambulatory Visit: Payer: Self-pay

## 2022-05-17 DIAGNOSIS — C50919 Malignant neoplasm of unspecified site of unspecified female breast: Secondary | ICD-10-CM

## 2022-05-17 MED ORDER — ABEMACICLIB 100 MG PO TABS: 100.0000 mg | ORAL_TABLET | Freq: Two times a day (BID) | ORAL | 3 refills | Status: DC

## 2022-05-18 ENCOUNTER — Ambulatory Visit (INDEPENDENT_AMBULATORY_CARE_PROVIDER_SITE_OTHER): Payer: Medicare Other | Admitting: Pulmonary Disease

## 2022-05-18 ENCOUNTER — Encounter (HOSPITAL_BASED_OUTPATIENT_CLINIC_OR_DEPARTMENT_OTHER): Payer: Self-pay | Admitting: Pulmonary Disease

## 2022-05-18 VITALS — BP 116/60 | HR 101 | Ht 67.5 in | Wt 200.8 lb

## 2022-05-18 DIAGNOSIS — R0602 Shortness of breath: Secondary | ICD-10-CM | POA: Diagnosis not present

## 2022-05-18 NOTE — Progress Notes (Signed)
Subjective:   PATIENT ID: Brandy [name] GENDER: female DOB: 1965-09-17, MRN: [id number]   HPI  Chief Complaint  Patient presents with   Follow-up    Needs to increase o2   Reason for Visit: Follow-up  [name] is a 56 year old female former smoker with metastatic breast cancer, obesity, chronic diastolic heart failure, chronic pain, anxiety, ASD s/p post-repair in 2006 and insomnia who presents for follow-up  Synopsis: Initially referred for evaluation of shortness of breath. PFTs with mild restrictive defect and reduced DLCO. On verzenio and faslodex for metastatic breast cancer 2021 - Methacholine challenge negative. Prior pulmonary nodules decreased in size since 02/2019, likely representing mets post-treatment. CT with no ILD. 2022 - S/p right lumpectomy in June with no breast cancer progression. Worsening dyspnea with unclear cause.Some coughing, nonproductive. Tried Breo which was ineffective. 2023 - Dx with OSA and on CPAP. Hospitalized in Nov for electrolyte issues and discharged on home O2. Ambulatory O2 in Dec with no desaturations.  05/18/22 Since our last visit she was started on CPAP and followed by Cardiology. She reports she is compliant with therapy. Last month she was hospitalized for electrolyte issues and discharged on home O2. Her last potassium was normal which Oncology monitors monthly. Reports worsening fatigue. Has productive cough. She is compliant with Advair but does not feels this help. She reports that walking short distances will cause her be winded. No wheezing. Has increased her O2 but unknown what her saturations are.  Social History: Quit smoking in 2020. 1/4 ppd x 25 years.  Stressed about her husband, poor housing situation  Past Medical History:  Diagnosis Date   Acute pansinusitis 08/02/2017   Anxiety    Arthritis    ASD (atrial septal defect)    s/p closure with Amplatzer device 10/05/04 (Dr. Myriam Jacobson,  [place]) 10/05/04   Cancer [facility])    Cataract    Dyspnea    Fatty liver 09/04/2019   GERD (gastroesophageal reflux disease)    Heart murmur    no longer heard   History of hiatal hernia    Hyperlipidemia    Legally blind in right eye, as defined in [place]    Lumbar herniated disc    PONV (postoperative nausea and vomiting)    Sciatica      Allergies  Allergen Reactions   Zithromax [Azithromycin] Shortness Of Breath and Itching    TOTAL BODY ITCHING [EVEN SOLES OF FEET] WHEEZING    Tramadol Itching and Other (See Comments)    Has taken recently without any side effects.   Psyllium Nausea And Vomiting and Other (See Comments)    Metamucil. Sneezing       Outpatient Medications Prior to Visit  Medication Sig Dispense Refill   abemaciclib (VERZENIO) 100 MG tablet Take 1 tablet (100 mg total) by mouth 2 (two) times daily. 56 tablet 3   acetaminophen (TYLENOL) 500 MG tablet Take 500 mg by mouth daily as needed for moderate pain.     albuterol (PROVENTIL) (2.5 MG/3ML) 0.083% nebulizer solution Take 3 mLs (2.5 mg total) by nebulization every 6 (six) hours as needed for wheezing or shortness of breath. (Patient taking differently: Take 2.5 mg by nebulization as needed for wheezing or shortness of breath.) 180 mL 1   ALPRAZolam (XANAX) 1 MG tablet Take 1 tablet (1 mg total) by mouth at bedtime. 30 tablet 3   amitriptyline (ELAVIL) 75 MG tablet Take 75 mg by mouth at bedtime.  BELBUCA 150 MCG FILM Take 150 mcg by mouth every 12 (twelve) hours.     Carboxymethylcellul-Glycerin (LUBRICATING EYE DROPS OP) Place 1 drop into the right eye in the morning, at noon, and at bedtime.     diphenoxylate-atropine (LOMOTIL) 2.5-0.025 MG tablet Take 1 tablet by mouth 3 (three) times daily as needed for diarrhea or loose stools. 30 tablet 0   fluticasone-salmeterol (WIXELA INHUB) 250-50 MCG/ACT AEPB INHALE 1 INHALATION BY MOUTH  INTO THE LUNGS IN THE MORNING  AND AT BEDTIME 60 each 1   ketorolac (ACULAR) 0.5  % ophthalmic solution Place 1 drop into the left eye 3 (three) times daily.     loperamide (IMODIUM) 2 MG capsule Take 1-2 capsules (2-4 mg total) by mouth 4 (four) times daily as needed for diarrhea or loose stools. (Patient taking differently: Take 4 mg by mouth as needed for diarrhea or loose stools.) 30 capsule 1   Magnesium Oxide 400 MG CAPS Take 1 capsule (400 mg total) by mouth daily. 30 capsule 0   methocarbamol (ROBAXIN) 500 MG tablet Take 1 tablet (500 mg total) by mouth every 6 (six) hours as needed for muscle spasms. (Patient taking differently: Take 500 mg by mouth 3 (three) times daily as needed for muscle spasms.) 40 tablet 2   midodrine (PROAMATINE) 10 MG tablet Take 1 tablet (10 mg total) by mouth 3 (three) times daily. 270 tablet 3   Multiple Vitamin (MULTIVITAMIN WITH MINERALS) TABS tablet Take 1 tablet by mouth daily.     omeprazole (PRILOSEC OTC) 20 MG tablet Take 20 mg by mouth daily.     rosuvastatin (CRESTOR) 10 MG tablet TAKE 1 TABLET BY MOUTH DAILY (Patient taking differently: Take 10 mg by mouth at bedtime.) 100 tablet 2   triamcinolone cream (KENALOG) 0.1 % Apply 1 Application topically 2 (two) times daily.     Vitamin D, Ergocalciferol, (DRISDOL) 1.25 MG (50000 UNIT) CAPS capsule TAKE 1 CAPSULE ONCE PER WEEK (Patient taking differently: Take 50,000 Units by mouth every 7 (seven) days.) 12 capsule 3   White Petrolatum-Mineral Oil (LUBRICANT EYE NIGHTTIME) OINT Place 1 drop into the left eye at bedtime as needed (dryness).     XIIDRA 5 % SOLN Place 1 drop into the left eye in the morning, at noon, and at bedtime.     potassium chloride SA (KLOR-CON M) 20 MEQ tablet Take 2 tablets (40 mEq total) by mouth 2 (two) times daily for 7 days. 28 tablet 0   No facility-administered medications prior to visit.    Review of Systems  Constitutional:  Positive for malaise/fatigue. Negative for chills, diaphoresis, fever and weight loss.  HENT:  Negative for congestion.    Respiratory:  Positive for cough, sputum production and shortness of breath. Negative for hemoptysis and wheezing.   Cardiovascular:  Negative for chest pain, palpitations and leg swelling.    Objective:   Vitals:   05/18/22 1326  BP: 116/60  Pulse: (!) 101  SpO2: 98%  Weight: 200 lb 13.4 oz (91.1 kg)  Height: 5' 7.5" (1.715 m)  SpO2: 98 % (1.5L continue) O2 Device: Nasal cannula O2 Flow Rate (L/min): 2 L/min O2 Type: Continuous O2  Physical Exam: General: Well-appearing, no acute distress HENT: [place], AT, s/p cleft palate repaire Eyes: EOMI, no scleral icterus Respiratory: Clear to auscultation bilaterally.  No crackles, wheezing or rales Cardiovascular: RRR, -M/R/G, no JVD Extremities:-Edema,-tenderness Neuro: AAO x4, CNII-XII grossly intact Psych: Normal mood, normal affect   Data Reviewed:  Imaging:  CTA 10/30/19 - No pulmonary embolism. RUL 10x41m, 555m unchanged. RLL nodule 26m8mew CT Chest 05/21/20 - No evidence of ILD. 1.4 x 1.0 x 0.8 cm aggressive appearing nodule near the apex of the right upper lobe PET 08/07/20 - 8 mm irregular nodule in right apex improved compared to CT 2020 PET 05/13/22 - 05/13/22 - Stable 8 mm nodule in the RUL, stable LUL 3mm326mPFT: 12/18/19 FVC 3.13 (83%) FEV1 2.49 (92%) Ratio 88  TLC 78% DLCO 58% Interpretation: Mild restrictive defect with moderate reduction in gas exchange  02/17/20 Negative methacholine challenge  04/30/21 FVC 2.64 (85%) FEV1 2.32 (94%) Ratio 89  TLC 80% DLCO 59% Interpretation: Isolated moderate reduction in gas exchange  Echocardiogram: 09/2019 Normal EF, grade I DD, mild MR.  06/03/21 EF 60-65%. Normal diastolics. No valvular or WMA  Coronary CT 06/17/21 - Calcium score of 0  CBC    Component Value Date/Time   WBC 5.4 05/06/2022 0958   WBC 6.2 04/25/2022 0844   RBC 3.53 (L) 05/06/2022 0958   RBC 3.21 (L) 04/25/2022 0844   HGB 12.5 05/06/2022 0958   HCT 37.2 05/06/2022 0958   PLT 288 05/06/2022 0958    MCV 105 (H) 05/06/2022 0958   MCH 35.4 (H) 05/06/2022 0958   MCH 34.9 (H) 04/25/2022 0844   MCHC 33.6 05/06/2022 0958   MCHC 32.6 04/25/2022 0844   RDW 13.8 05/06/2022 0958   LYMPHSABS 2.9 04/22/2022 0803   LYMPHSABS 3.2 (H) 07/28/2021 0916   MONOABS 0.3 04/22/2022 0803   EOSABS 0.0 04/22/2022 0803   EOSABS 0.1 07/28/2021 0916   BASOSABS 0.0 04/22/2022 0803   BASOSABS 0.0 07/28/2021 0916   Absolute eos  01/22/21 - 300 05/03/21 - 0 06/01/21 - 0 07/28/21 -100 04/22/22 -0    Assessment & Plan:   Discussion: 56 y67r old female former smoker (6 pack-years) with metastatic breast cancer s/p right lumpectomy with pulmonary mets who presents for follow-up. Reviewed recent history including discharge summaries. Recent hospitalization with hypoxemia however no desaturations on ambulatory O2 today in-clinic. Continues to have dyspnea un of unclear etiology despite bronchodilator support. Discussed plan with patient's mom and addressed questions and concerns via telephone.  Breo - d/c'd due to ineffectiveness  Shortness of breath - unchanged, persistent Deconditioning --Prior methacholine challenge which was negative for asthma --CONTINUE Advair 250-50 mcg ONE puff TWICE a day --CONTINUE Albuterol AS NEEDED for shortness of breath --Refer to Pulmonary Rehab  Isolated moderate DLCO New hypoxemia --Echocardiogram neg --Low suspicion for PH. If above treatment neg, could consider RHC but the diagnosis is less likely in absence of hypoxemia  Health Maintenance Immunization History  Administered Date(s) Administered   Influenza Inj Mdck Quad Pf 04/25/2019   Influenza,inj,Quad PF,6+ Mos 02/24/2020, 04/15/2022   Influenza,inj,quad, With Preservative 04/25/2019   Influenza-Unspecified 05/26/2021   Moderna Sars-Covid-2 Vaccination 06/29/2019, 08/03/2019, 06/24/2020   PFIZER(Purple Top)SARS-COV-2 Vaccination 05/26/2021   Pneumococcal Conjugate-13 05/29/2019   Pneumococcal  Polysaccharide-23 07/29/2019   Zoster Recombinat (Shingrix) 02/17/2020, 04/20/2020   CT Lung Screen - not qualified  Orders Placed This Encounter  Procedures   AMB referral to pulmonary rehabilitation    Referral Priority:   Routine    Referral Type:   Consultation    Number of Visits Requested:   1   No orders of the defined types were placed in this encounter.  Return in about 3 months (around 08/17/2022).   I have spent a total time of 45-minutes on the day of the appointment  including chart review, data review, collecting history, coordinating care and discussing medical diagnosis and plan with the patient/family. Past medical history, allergies, medications were reviewed. Pertinent imaging, labs and tests included in this note have been reviewed and interpreted independently by me.  [place], MD [facility] 05/18/2022 8:49 PM  Office Number [phone]

## 2022-05-18 NOTE — Patient Instructions (Addendum)
Shortness of breath - unchanged, persistent Deconditioning --Prior methacholine challenge which was negative for asthma --CONTINUE Advair 250-50 mcg ONE puff TWICE a day --CONTINUE Albuterol AS NEEDED for shortness of breath --Refer to Pulmonary Rehab  Isolated moderate DLCO New hypoxemia --Echocardiogram neg --Low suspicion for PH --However if persistently, hypoxemic can refer for right heart cath  Follow-up with me in 3 months

## 2022-05-20 ENCOUNTER — Inpatient Hospital Stay: Payer: Medicare Other

## 2022-05-20 ENCOUNTER — Inpatient Hospital Stay: Payer: Medicare Other | Attending: Hematology

## 2022-05-20 DIAGNOSIS — Z87891 Personal history of nicotine dependence: Secondary | ICD-10-CM | POA: Insufficient documentation

## 2022-05-20 DIAGNOSIS — M4802 Spinal stenosis, cervical region: Secondary | ICD-10-CM | POA: Insufficient documentation

## 2022-05-20 DIAGNOSIS — Z170 Estrogen receptor positive status [ER+]: Secondary | ICD-10-CM | POA: Insufficient documentation

## 2022-05-20 DIAGNOSIS — K219 Gastro-esophageal reflux disease without esophagitis: Secondary | ICD-10-CM | POA: Insufficient documentation

## 2022-05-20 DIAGNOSIS — Z8249 Family history of ischemic heart disease and other diseases of the circulatory system: Secondary | ICD-10-CM | POA: Insufficient documentation

## 2022-05-20 DIAGNOSIS — Z801 Family history of malignant neoplasm of trachea, bronchus and lung: Secondary | ICD-10-CM | POA: Insufficient documentation

## 2022-05-20 DIAGNOSIS — Z5111 Encounter for antineoplastic chemotherapy: Secondary | ICD-10-CM | POA: Insufficient documentation

## 2022-05-20 DIAGNOSIS — I700 Atherosclerosis of aorta: Secondary | ICD-10-CM | POA: Insufficient documentation

## 2022-05-20 DIAGNOSIS — C7801 Secondary malignant neoplasm of right lung: Secondary | ICD-10-CM | POA: Insufficient documentation

## 2022-05-20 DIAGNOSIS — Z833 Family history of diabetes mellitus: Secondary | ICD-10-CM | POA: Insufficient documentation

## 2022-05-20 DIAGNOSIS — C7802 Secondary malignant neoplasm of left lung: Secondary | ICD-10-CM | POA: Insufficient documentation

## 2022-05-20 DIAGNOSIS — R7989 Other specified abnormal findings of blood chemistry: Secondary | ICD-10-CM | POA: Insufficient documentation

## 2022-05-20 DIAGNOSIS — M5031 Other cervical disc degeneration,  high cervical region: Secondary | ICD-10-CM | POA: Insufficient documentation

## 2022-05-20 DIAGNOSIS — Z8261 Family history of arthritis: Secondary | ICD-10-CM | POA: Insufficient documentation

## 2022-05-20 DIAGNOSIS — C50511 Malignant neoplasm of lower-outer quadrant of right female breast: Secondary | ICD-10-CM | POA: Insufficient documentation

## 2022-05-20 DIAGNOSIS — E876 Hypokalemia: Secondary | ICD-10-CM | POA: Insufficient documentation

## 2022-05-20 DIAGNOSIS — K760 Fatty (change of) liver, not elsewhere classified: Secondary | ICD-10-CM | POA: Insufficient documentation

## 2022-05-23 ENCOUNTER — Telehealth (HOSPITAL_COMMUNITY): Payer: Self-pay

## 2022-05-23 ENCOUNTER — Ambulatory Visit: Payer: Medicare Other | Admitting: Cardiology

## 2022-05-23 NOTE — Telephone Encounter (Signed)
Received referral from Dr. [name] for this pt to participate in Pulmonary Rehab with the diagnosis of Shortness of Breath. Clinical review of pt follow up appt on 05/18/22 Pulmonary office note. Pt appropriate for scheduling for Pulmonary rehab. Will forward to support staff for scheduling and verification of insurance eligibility/benefits with pt consent.   Elmon Else MS, ACSM-CEP Cardiac and Pulmonary Rehab

## 2022-05-24 ENCOUNTER — Inpatient Hospital Stay: Payer: Medicare Other

## 2022-05-24 ENCOUNTER — Encounter (HOSPITAL_COMMUNITY): Payer: Self-pay

## 2022-05-24 ENCOUNTER — Other Ambulatory Visit: Payer: Self-pay

## 2022-05-24 ENCOUNTER — Other Ambulatory Visit: Payer: Self-pay | Admitting: *Deleted

## 2022-05-24 VITALS — BP 128/63 | HR 89 | Temp 98.2°F | Resp 18

## 2022-05-24 DIAGNOSIS — I700 Atherosclerosis of aorta: Secondary | ICD-10-CM | POA: Diagnosis not present

## 2022-05-24 DIAGNOSIS — Z5111 Encounter for antineoplastic chemotherapy: Secondary | ICD-10-CM | POA: Diagnosis not present

## 2022-05-24 DIAGNOSIS — R7989 Other specified abnormal findings of blood chemistry: Secondary | ICD-10-CM | POA: Diagnosis not present

## 2022-05-24 DIAGNOSIS — Z87891 Personal history of nicotine dependence: Secondary | ICD-10-CM | POA: Diagnosis not present

## 2022-05-24 DIAGNOSIS — M5031 Other cervical disc degeneration,  high cervical region: Secondary | ICD-10-CM | POA: Diagnosis not present

## 2022-05-24 DIAGNOSIS — C7802 Secondary malignant neoplasm of left lung: Secondary | ICD-10-CM | POA: Diagnosis not present

## 2022-05-24 DIAGNOSIS — C50919 Malignant neoplasm of unspecified site of unspecified female breast: Secondary | ICD-10-CM

## 2022-05-24 DIAGNOSIS — C50511 Malignant neoplasm of lower-outer quadrant of right female breast: Secondary | ICD-10-CM | POA: Diagnosis not present

## 2022-05-24 DIAGNOSIS — C7801 Secondary malignant neoplasm of right lung: Secondary | ICD-10-CM | POA: Diagnosis not present

## 2022-05-24 DIAGNOSIS — M4802 Spinal stenosis, cervical region: Secondary | ICD-10-CM | POA: Diagnosis not present

## 2022-05-24 DIAGNOSIS — Z801 Family history of malignant neoplasm of trachea, bronchus and lung: Secondary | ICD-10-CM | POA: Diagnosis not present

## 2022-05-24 DIAGNOSIS — Z170 Estrogen receptor positive status [ER+]: Secondary | ICD-10-CM | POA: Diagnosis not present

## 2022-05-24 DIAGNOSIS — K219 Gastro-esophageal reflux disease without esophagitis: Secondary | ICD-10-CM | POA: Diagnosis not present

## 2022-05-24 DIAGNOSIS — Z8249 Family history of ischemic heart disease and other diseases of the circulatory system: Secondary | ICD-10-CM | POA: Diagnosis not present

## 2022-05-24 DIAGNOSIS — K760 Fatty (change of) liver, not elsewhere classified: Secondary | ICD-10-CM | POA: Diagnosis not present

## 2022-05-24 DIAGNOSIS — Z8261 Family history of arthritis: Secondary | ICD-10-CM | POA: Diagnosis not present

## 2022-05-24 DIAGNOSIS — E876 Hypokalemia: Secondary | ICD-10-CM | POA: Diagnosis not present

## 2022-05-24 DIAGNOSIS — Z833 Family history of diabetes mellitus: Secondary | ICD-10-CM | POA: Diagnosis not present

## 2022-05-24 LAB — CBC WITH DIFFERENTIAL (CANCER CENTER ONLY)
Abs Immature Granulocytes: 0.01 10*3/uL (ref 0.00–0.07)
Basophils Absolute: 0 10*3/uL (ref 0.0–0.1)
Basophils Relative: 1 %
Eosinophils Absolute: 0.1 10*3/uL (ref 0.0–0.5)
Eosinophils Relative: 2 %
HCT: 30 % — ABNORMAL LOW (ref 36.0–46.0)
Hemoglobin: 10.6 g/dL — ABNORMAL LOW (ref 12.0–15.0)
Immature Granulocytes: 0 %
Lymphocytes Relative: 57 %
Lymphs Abs: 2.9 10*3/uL (ref 0.7–4.0)
MCH: 36.6 pg — ABNORMAL HIGH (ref 26.0–34.0)
MCHC: 35.3 g/dL (ref 30.0–36.0)
MCV: 103.4 fL — ABNORMAL HIGH (ref 80.0–100.0)
Monocytes Absolute: 0.3 10*3/uL (ref 0.1–1.0)
Monocytes Relative: 7 %
Neutro Abs: 1.6 10*3/uL — ABNORMAL LOW (ref 1.7–7.7)
Neutrophils Relative %: 33 %
Platelet Count: 186 10*3/uL (ref 150–400)
RBC: 2.9 MIL/uL — ABNORMAL LOW (ref 3.87–5.11)
RDW: 15.2 % (ref 11.5–15.5)
WBC Count: 4.9 10*3/uL (ref 4.0–10.5)
nRBC: 0 % (ref 0.0–0.2)

## 2022-05-24 LAB — CMP (CANCER CENTER ONLY)
ALT: 46 U/L — ABNORMAL HIGH (ref 0–44)
AST: 59 U/L — ABNORMAL HIGH (ref 15–41)
Albumin: 3.1 g/dL — ABNORMAL LOW (ref 3.5–5.0)
Alkaline Phosphatase: 211 U/L — ABNORMAL HIGH (ref 38–126)
Anion gap: 10 (ref 5–15)
BUN: 12 mg/dL (ref 6–20)
CO2: 31 mmol/L (ref 22–32)
Calcium: 8.9 mg/dL (ref 8.9–10.3)
Chloride: 102 mmol/L (ref 98–111)
Creatinine: 1.66 mg/dL — ABNORMAL HIGH (ref 0.44–1.00)
GFR, Estimated: 36 mL/min — ABNORMAL LOW (ref >60)
Glucose, Bld: 113 mg/dL — ABNORMAL HIGH (ref 70–99)
Potassium: 2.6 mmol/L — CL (ref 3.5–5.1)
Sodium: 143 mmol/L (ref 135–145)
Total Bilirubin: 0.8 mg/dL (ref 0.3–1.2)
Total Protein: 7.3 g/dL (ref 6.5–8.1)

## 2022-05-24 MED ORDER — POTASSIUM CHLORIDE CRYS ER 20 MEQ PO TBCR: 20.0000 meq | EXTENDED_RELEASE_TABLET | Freq: Two times a day (BID) | ORAL | 0 refills | Status: DC

## 2022-05-24 MED ORDER — FULVESTRANT 250 MG/5ML IM SOSY
500.0000 mg | PREFILLED_SYRINGE | Freq: Once | INTRAMUSCULAR | Status: AC
  Administered 2022-05-24: 500 mg via INTRAMUSCULAR
  Filled 2022-05-24: qty 10

## 2022-05-24 NOTE — Telephone Encounter (Signed)
CRITICAL VALUE STICKER  CRITICAL VALUE: K 2.6  RECEIVER (on-site recipient of call):Sharlynn Oliphant, [place] NOTIFIED: 05/23/22 0944  MD NOTIFIED: Dr Lorenso Courier  RESPONSE:  Potassium prescription sent to pharmacy  Patient notified of prescription.

## 2022-05-24 NOTE — Progress Notes (Signed)
Patient is here for Faslodex injection appointment. Patient stated she has taken all her premedications prior to appointment.

## 2022-05-24 NOTE — Progress Notes (Unsigned)
Critical K =2.6.  Notified to Sharlynn Oliphant at [phone] on 19Dec23 by Gustavus Messing, [place].

## 2022-05-25 ENCOUNTER — Other Ambulatory Visit: Payer: Medicare Other

## 2022-05-26 DIAGNOSIS — R0600 Dyspnea, unspecified: Secondary | ICD-10-CM | POA: Diagnosis not present

## 2022-06-01 ENCOUNTER — Encounter: Payer: Medicare Other | Admitting: Family

## 2022-06-01 ENCOUNTER — Ambulatory Visit
Payer: Medicare Other | Attending: Student in an Organized Health Care Education/Training Program | Admitting: Student in an Organized Health Care Education/Training Program

## 2022-06-01 ENCOUNTER — Encounter: Payer: Self-pay | Admitting: Student in an Organized Health Care Education/Training Program

## 2022-06-01 ENCOUNTER — Ambulatory Visit
Admission: RE | Admit: 2022-06-01 | Discharge: 2022-06-01 | Disposition: A | Discharge status: Home / Self Care | Payer: Medicare Other | Source: Ambulatory Visit | Attending: Student in an Organized Health Care Education/Training Program | Admitting: Student in an Organized Health Care Education/Training Program

## 2022-06-01 VITALS — BP 112/79 | HR 85 | Temp 97.3°F | Resp 18 | Ht 68.0 in | Wt 191.0 lb

## 2022-06-01 DIAGNOSIS — M47816 Spondylosis without myelopathy or radiculopathy, lumbar region: Secondary | ICD-10-CM | POA: Diagnosis not present

## 2022-06-01 DIAGNOSIS — M4310 Spondylolisthesis, site unspecified: Secondary | ICD-10-CM | POA: Insufficient documentation

## 2022-06-01 DIAGNOSIS — R0600 Dyspnea, unspecified: Secondary | ICD-10-CM | POA: Diagnosis not present

## 2022-06-01 DIAGNOSIS — G894 Chronic pain syndrome: Secondary | ICD-10-CM | POA: Diagnosis not present

## 2022-06-01 DIAGNOSIS — M47817 Spondylosis without myelopathy or radiculopathy, lumbosacral region: Secondary | ICD-10-CM | POA: Insufficient documentation

## 2022-06-01 MED ORDER — BELBUCA 300 MCG BU FILM: 1.0000 | ORAL_FILM | Freq: Two times a day (BID) | BUCCAL | 2 refills | Status: AC

## 2022-06-01 MED ORDER — MIDAZOLAM HCL 2 MG/2ML IJ SOLN
INTRAMUSCULAR | Status: AC
  Filled 2022-06-01: qty 2

## 2022-06-01 MED ORDER — LACTATED RINGERS IV SOLN: Freq: Once | INTRAVENOUS | Status: AC

## 2022-06-01 MED ORDER — ROPIVACAINE HCL 2 MG/ML IJ SOLN
9.0000 mL | Freq: Once | INTRAMUSCULAR | Status: AC
  Administered 2022-06-01: 9 mL via PERINEURAL

## 2022-06-01 MED ORDER — MIDAZOLAM HCL 5 MG/5ML IJ SOLN
0.5000 mg | Freq: Once | INTRAMUSCULAR | Status: AC
  Administered 2022-06-01: 0.5 mg via INTRAVENOUS
  Administered 2022-06-01: 1.5 mg via INTRAVENOUS

## 2022-06-01 MED ORDER — ROPIVACAINE HCL 2 MG/ML IJ SOLN
INTRAMUSCULAR | Status: AC
  Filled 2022-06-01: qty 20

## 2022-06-01 MED ORDER — DEXAMETHASONE SODIUM PHOSPHATE 10 MG/ML IJ SOLN
10.0000 mg | Freq: Once | INTRAMUSCULAR | Status: AC
  Administered 2022-06-01: 10 mg

## 2022-06-01 MED ORDER — LIDOCAINE HCL 2 % IJ SOLN
INTRAMUSCULAR | Status: AC
  Filled 2022-06-01: qty 20

## 2022-06-01 MED ORDER — DEXAMETHASONE SODIUM PHOSPHATE 10 MG/ML IJ SOLN
INTRAMUSCULAR | Status: AC
  Filled 2022-06-01: qty 1

## 2022-06-01 NOTE — Progress Notes (Signed)
  This encounter was created in error - please disregard. No show 

## 2022-06-01 NOTE — Patient Instructions (Signed)

## 2022-06-01 NOTE — Progress Notes (Signed)
Safety precautions to be maintained throughout the outpatient stay will include: orient to surroundings, keep bed in low position, maintain call bell within reach at all times, provide assistance with transfer out of bed and ambulation.  

## 2022-06-01 NOTE — Progress Notes (Signed)
PROVIDER NOTE: Interpretation of information contained herein should be left to medically-trained personnel. Specific patient instructions are provided elsewhere under "Patient Instructions" section of medical record. This document was created in part using STT-dictation technology, any transcriptional errors that may result from this process are unintentional.  Patient: Brandy [name] Type: Established DOB: 1965-10-05 MRN: [id number] PCP: Sandrea Hughs, NP  Service: Procedure DOS: 06/01/2022 Setting: Ambulatory Location: Ambulatory outpatient facility Delivery: Face-to-face Provider: Gillis Santa, MD Specialty: Interventional Pain Management Specialty designation: 09 Location: Outpatient facility Ref. Prov.: Ngetich, Dinah C, NP    Procedure:           Type: Lumbar Facet, Medial Branch Radiofrequency Ablation (RFA) #1  Laterality: Right (-RT)  Level: L3, L4, and L5 Medial Branch Level(s).  Imaging: Fluoroscopy-guided         Anesthesia: Local anesthesia (1-2% Lidocaine) Anxiolysis: IV Versed         DOS: 06/01/2022  Performed by: Gillis Santa, MD  Purpose: Therapeutic/Palliative Indications: Low back pain severe enough to impact quality of life or function. Indications: 1. Lumbar facet syndrome (Multilevel) (Bilateral) (R>L)   2. Spondylosis without myelopathy or radiculopathy, lumbosacral region   3. Grade 1 Retrolisthesis (L4-5, L5-S1)   4. Chronic pain syndrome   5. Lumbar facet joint syndrome   6. Retrolisthesis    Brandy [name] has been dealing with the above chronic pain for longer than three months and has either failed to respond, was unable to tolerate, or simply did not get enough benefit from other more conservative therapies including, but not limited to: 1. Over-the-counter medications 2. Anti-inflammatory medications 3. Muscle relaxants 4. Membrane stabilizers 5. Opioids 6. Physical therapy and/or chiropractic manipulation 7.  Modalities (Heat, ice, etc.) 8. Invasive techniques such as nerve blocks. Brandy [name] has attained more than 50% relief of the pain from a series of diagnostic injections conducted in separate occasions.  Pain Score: Pre-procedure: 6 /10 Post-procedure: 0-No pain/10     Position / Prep / Materials:  Position: Prone  Prep solution: DuraPrep (Iodine Povacrylex [0.7% available iodine] and Isopropyl Alcohol, 74% w/w) Prep Area: Entire Lumbosacral Region (Lower back from mid-thoracic region to end of tailbone and from flank to flank.) Materials:  Tray: RFA (Radiofrequency) tray Needle(s):  Type: RFA (Teflon-coated radiofrequency ablation needles)  Pre-op H&P Assessment:  Brandy [name] is a 56 y.o. (year old), female patient, seen today for interventional treatment. She  has a past surgical history that includes Breast surgery (Bilateral, 2011); Cataract extraction; Bunionectomy; Cleft palate repair; Cardiac surgery; Ablation; transthoracic echocardiogram; Cardiac catheterization; Lumbar laminectomy/decompression microdiscectomy (Left, 05/23/2016); Lumbar laminectomy/decompression microdiscectomy (Left, 05/23/2016); Shoulder injection (Left, 05/23/2016); Reduction mammaplasty (2011); Eye surgery (Right, 2019); Tubal ligation; and Breast lumpectomy with radioactive seed and sentinel lymph node biopsy (Right, 11/23/2020). Brandy [name] has a current medication list which includes the following prescription(s): abemaciclib, acetaminophen, albuterol, alprazolam, amitriptyline, belbuca, carboxymethylcellul-glycerin, diphenoxylate-atropine, wixela inhub, loperamide, methocarbamol, midodrine, multivitamin with minerals, omeprazole, potassium chloride sa, rosuvastatin, triamcinolone cream, vitamin d (ergocalciferol), lubricant eye nighttime, and xiidra. Her primarily concern today is the Back Pain (Right, lower)  Initial Vital Signs:  Pulse/HCG Rate: 98ECG Heart Rate:  98 Temp: (!) 97.3 F (36.3 C) Resp: 14 BP: 120/72 SpO2: 99 %  BMI: Estimated body mass index is 29.04 kg/m as calculated from the following:   Height as of this encounter: '5\' 8"'$  (1.727 m).   Weight as of this encounter: 191 lb (86.6 kg).  Risk Assessment: Allergies: Reviewed. She is  allergic to zithromax [azithromycin], tramadol, and psyllium.  Allergy Precautions: None required Coagulopathies: Reviewed. None identified.  Blood-thinner therapy: None at this time Active Infection(s): Reviewed. None identified. Brandy [name] is afebrile  Site Confirmation: Brandy [name] was asked to confirm the procedure and laterality before marking the site Procedure checklist: Completed Consent: Before the procedure and under the influence of no sedative(s), amnesic(s), or anxiolytics, the patient was informed of the treatment options, risks and possible complications. To fulfill our ethical and legal obligations, as recommended by the American Medical Association's Code of Ethics, I have informed the patient of my clinical impression; the nature and purpose of the treatment or procedure; the risks, benefits, and possible complications of the intervention; the alternatives, including doing nothing; the risk(s) and benefit(s) of the alternative treatment(s) or procedure(s); and the risk(s) and benefit(s) of doing nothing. The patient was provided information about the general risks and possible complications associated with the procedure. These may include, but are not limited to: failure to achieve desired goals, infection, bleeding, organ or nerve damage, allergic reactions, paralysis, and death. In addition, the patient was informed of those risks and complications associated to Spine-related procedures, such as failure to decrease pain; infection (i.e.: Meningitis, epidural or intraspinal abscess); bleeding (i.e.: epidural hematoma, subarachnoid hemorrhage, or any other type of  intraspinal or peri-dural bleeding); organ or nerve damage (i.e.: Any type of peripheral nerve, nerve root, or spinal cord injury) with subsequent damage to sensory, motor, and/or autonomic systems, resulting in permanent pain, numbness, and/or weakness of one or several areas of the body; allergic reactions; (i.e.: anaphylactic reaction); and/or death. Furthermore, the patient was informed of those risks and complications associated with the medications. These include, but are not limited to: allergic reactions (i.e.: anaphylactic or anaphylactoid reaction(s)); adrenal axis suppression; blood sugar elevation that in diabetics may result in ketoacidosis or comma; water retention that in patients with history of congestive heart failure may result in shortness of breath, pulmonary edema, and decompensation with resultant heart failure; weight gain; swelling or edema; medication-induced neural toxicity; particulate matter embolism and blood vessel occlusion with resultant organ, and/or nervous system infarction; and/or aseptic necrosis of one or more joints. Finally, the patient was informed that Medicine is not an exact science; therefore, there is also the possibility of unforeseen or unpredictable risks and/or possible complications that may result in a catastrophic outcome. The patient indicated having understood very clearly. We have given the patient no guarantees and we have made no promises. Enough time was given to the patient to ask questions, all of which were answered to the patient's satisfaction. Brandy [name] has indicated that she wanted to continue with the procedure. Attestation: I, the ordering provider, attest that I have discussed with the patient the benefits, risks, side-effects, alternatives, likelihood of achieving goals, and potential problems during recovery for the procedure that I have provided informed consent. Date  Time: 06/01/2022  7:51 AM  Pre-Procedure Preparation:   Monitoring: As per clinic protocol. Respiration, ETCO2, SpO2, BP, heart rate and rhythm monitor placed and checked for adequate function Safety Precautions: Patient was assessed for positional comfort and pressure points before starting the procedure. Time-out: I initiated and conducted the "Time-out" before starting the procedure, as per protocol. The patient was asked to participate by confirming the accuracy of the "Time Out" information. Verification of the correct person, site, and procedure were performed and confirmed by me, the nursing staff, and the patient. "Time-out" conducted as per Joint Commission's Universal  Protocol (UP.01.01.01). Time: 25  Description of Procedure:          Laterality: Right Levels:  L3, L4, and L5 Medial Branch Level(s). Safety Precautions: Aspiration looking for blood return was conducted prior to all injections. At no point did we inject any substances, as a needle was being advanced. Before injecting, the patient was told to immediately notify me if she was experiencing any new onset of "ringing in the ears, or metallic taste in the mouth". No attempts were made at seeking any paresthesias. Safe injection practices and needle disposal techniques used. Medications properly checked for expiration dates. SDV (single dose vial) medications used. After the completion of the procedure, all disposable equipment used was discarded in the proper designated medical waste containers. Local Anesthesia: Protocol guidelines were followed. The patient was positioned over the fluoroscopy table. The area was prepped in the usual manner. The time-out was completed. The target area was identified using fluoroscopy. A 12-in long, straight, sterile hemostat was used with fluoroscopic guidance to locate the targets for each level blocked. Once located, the skin was marked with an approved surgical skin marker. Once all sites were marked, the skin (epidermis, dermis, and hypodermis), as  well as deeper tissues (fat, connective tissue and muscle) were infiltrated with a small amount of a short-acting local anesthetic, loaded on a 10cc syringe with a 25G, 1.5-in  Needle. An appropriate amount of time was allowed for local anesthetics to take effect before proceeding to the next step. Technical description of process:  Radiofrequency Ablation (RFA) L3 Medial Branch Nerve RFA: The target area for the L3 medial branch is at the junction of the postero-lateral aspect of the superior articular process and the superior, posterior, and medial edge of the transverse process of L4. Under fluoroscopic guidance, a Radiofrequency needle was inserted until contact was made with os over the superior postero-lateral aspect of the pedicular shadow (target area). Sensory and motor testing was conducted to properly adjust the position of the needle. Once satisfactory placement of the needle was achieved, the numbing solution was slowly injected after negative aspiration for blood. 2.0 mL of the nerve block solution was injected without difficulty or complication. After waiting for at least 3 minutes, the ablation was performed. Once completed, the needle was removed intact. L4 Medial Branch Nerve RFA: The target area for the L4 medial branch is at the junction of the postero-lateral aspect of the superior articular process and the superior, posterior, and medial edge of the transverse process of L5. Under fluoroscopic guidance, a Radiofrequency needle was inserted until contact was made with os over the superior postero-lateral aspect of the pedicular shadow (target area). Sensory and motor testing was conducted to properly adjust the position of the needle. Once satisfactory placement of the needle was achieved, the numbing solution was slowly injected after negative aspiration for blood. 2.0 mL of the nerve block solution was injected without difficulty or complication. After waiting for at least 3 minutes, the  ablation was performed. Once completed, the needle was removed intact. L5 Medial Branch Nerve RFA: The target area for the L5 medial branch is at the junction of the postero-lateral aspect of the superior articular process of S1 and the superior, posterior, and medial edge of the sacral ala. Under fluoroscopic guidance, a Radiofrequency needle was inserted until contact was made with os over the superior postero-lateral aspect of the pedicular shadow (target area). Sensory and motor testing was conducted to properly adjust the position of the needle.  Once satisfactory placement of the needle was achieved, the numbing solution was slowly injected after negative aspiration for blood. 2.0 mL of the nerve block solution was injected without difficulty or complication. After waiting for at least 3 minutes, the ablation was performed. Once completed, the needle was removed intact.  Radiofrequency lesioning (ablation):  Radiofrequency Generator: Medtronic AccurianTM AG 1000 RF Generator Sensory Stimulation Parameters: 50 Hz was used to locate & identify the nerve, making sure that the needle was positioned such that there was no sensory stimulation below 0.3 V or above 0.7 V. Motor Stimulation Parameters: 2 Hz was used to evaluate the motor component. Care was taken not to lesion any nerves that demonstrated motor stimulation of the lower extremities at an output of less than 2.5 times that of the sensory threshold, or a maximum of 2.0 V. Lesioning Technique Parameters: Standard Radiofrequency settings. (Not bipolar or pulsed.) Temperature Settings: 80 degrees C Lesioning time: 60 seconds Intra-operative Compliance: Compliant  6cc solution made of 5cc of 0.2% ropivacaine, 1 cc of Decadron 10 mg/cc.  2 cc injected at each level above on the right.  Once the entire procedure was completed, the treated area was cleaned, making sure to leave some of the prepping solution back to take advantage of its long term  bactericidal properties.    Illustration of the posterior view of the lumbar spine and the posterior neural structures. Laminae of L2 through S1 are labeled. DPRL5, dorsal primary ramus of L5; DPRS1, dorsal primary ramus of S1; DPR3, dorsal primary ramus of L3; FJ, facet (zygapophyseal) joint L3-L4; I, inferior articular process of L4; LB1, lateral branch of dorsal primary ramus of L1; IAB, inferior articular branches from L3 medial branch (supplies L4-L5 facet joint); IBP, intermediate branch plexus; MB3, medial branch of dorsal primary ramus of L3; NR3, third lumbar nerve root; S, superior articular process of L5; SAB, superior articular branches from L4 (supplies L4-5 facet joint also); TP3, transverse process of L3.  Vitals:   06/01/22 0847 06/01/22 0852 06/01/22 0857 06/01/22 0900  BP: 136/68 137/67 131/72 112/79  Pulse:    85  Resp: (!) 32 (!) '23 20 18  '$ Temp:    (!) 97.3 F (36.3 C)  TempSrc:      SpO2: 98% 94% 94% 98%  Weight:      Height:        Start Time: 0840 hrs. End Time: 0857 hrs.  Imaging Guidance (Spinal):          Type of Imaging Technique: Fluoroscopy Guidance (Spinal) Indication(s): Assistance in needle guidance and placement for procedures requiring needle placement in or near specific anatomical locations not easily accessible without such assistance. Exposure Time: Please see nurses notes. Contrast: None used. Fluoroscopic Guidance: I was personally present during the use of fluoroscopy. "Tunnel Vision Technique" used to obtain the best possible view of the target area. Parallax error corrected before commencing the procedure. "Direction-depth-direction" technique used to introduce the needle under continuous pulsed fluoroscopy. Once target was reached, antero-posterior, oblique, and lateral fluoroscopic projection used confirm needle placement in all planes. Images permanently stored in EMR. Interpretation: No contrast injected. I personally interpreted the imaging  intraoperatively. Adequate needle placement confirmed in multiple planes. Permanent images saved into the patient's record.  Antibiotic Prophylaxis:   Anti-infectives (From admission, onward)    None      Indication(s): None identified  Post-operative Assessment:  Post-procedure Vital Signs:  Pulse/HCG Rate: 8593 Temp: (!) 97.3 F (36.3 C) Resp: 18 BP: 112/79  SpO2: 98 %  EBL: None  Complications: No immediate post-treatment complications observed by team, or reported by patient.  Note: The patient tolerated the entire procedure well. A repeat set of vitals were taken after the procedure and the patient was kept under observation following institutional policy, for this type of procedure. Post-procedural neurological assessment was performed, showing return to baseline, prior to discharge. The patient was provided with post-procedure discharge instructions, including a section on how to identify potential problems. Should any problems arise concerning this procedure, the patient was given instructions to immediately contact us, at any time, without hesitation. In any case, we plan to contact the patient by telephone for a follow-up status report regarding this interventional procedure.  Comments:  No additional relevant information.  Plan of Care  Orders:  Orders Placed This Encounter  Procedures   DG PAIN CLINIC C-ARM 1-60 MIN NO REPORT    Intraoperative interpretation by procedural physician at [facility].    Standing Status:   Standing    Number of Occurrences:   1    Order Specific Question:   Reason for exam:    Answer:   Assistance in needle guidance and placement for procedures requiring needle placement in or near specific anatomical locations not easily accessible without such assistance.   Chronic Opioid Analgesic: While the patient does find belbuca 150 mcg twice daily effective, she states that the winter months are more challenging and she has been  experiencing higher levels of pain.  While the RFA should help out with her low back pain, she is requesting an increase in her belbuca to 300 mcg twice daily.  This is reasonable.  PMP checked and reviewed.  Medications ordered for procedure: Meds ordered this encounter  Medications   lactated ringers infusion   midazolam (VERSED) 5 MG/5ML injection 0.5-2 mg    Make sure Flumazenil is available in the pyxis when using this medication. If oversedation occurs, administer 0.2 mg IV over 15 sec. If after 45 sec no response, administer 0.2 mg again over 1 min; may repeat at 1 min intervals; not to exceed 4 doses (1 mg)   dexamethasone (DECADRON) injection 10 mg   ropivacaine (PF) 2 mg/mL (0.2%) (NAROPIN) injection 9 mL   Buprenorphine HCl (BELBUCA) 300 MCG FILM    Sig: Place 1 Film inside cheek every 12 (twelve) hours.    Dispense:  60 Film    Refill:  2    Chronic Pain: STOP Act (Not applicable) Fill 1 day early if closed on refill date. Avoid benzodiazepines within 8 hours of opioids   Medications administered: We administered lactated ringers, midazolam, dexamethasone, and ropivacaine (PF) 2 mg/mL (0.2%).  See the medical record for exact dosing, route, and time of administration.  Follow-up plan:   Return in about 2 weeks (around 06/15/2022) for Left L3,4,5 RFA, in clinic IV Versed.       Interventional management options:    Lumbar RFA; right L3, L4, L5 06/01/2022 SCS trial Sprint PNS medial branch Diagnostic caudal ESI  Diagnostic left sacroiliac joint block  Diagnostic right IA hip joint injection       Recent Visits Date Type Provider Dept  05/02/22 Office Visit Gillis Santa, MD [facility]  03/14/22 Procedure visit Gillis Santa, MD [facility]  03/08/22 Office Visit Gillis Santa, MD [facility]  Showing recent visits within past 90 days and meeting all other requirements Today's Visits Date Type Provider Dept  06/01/22 Procedure visit  Gillis Santa, MD [facility]  Showing today's visits and meeting all other requirements Future Appointments Date Type Provider Dept  06/02/22 Appointment Gillis Santa, MD [facility]  Showing future appointments within next 90 days and meeting all other requirements  Disposition: Discharge home  Discharge (Date  Time): 06/01/2022;  (unable to schedule for contralateral side due to needing insurance approval per [employer] [facility] hrs.   Primary Care Physician: Sandrea Hughs, NP Location: [facility] Note by: Gillis Santa, MD Date: 06/01/2022; Time: 9:29 AM  Disclaimer:  Medicine is not an exact science. The only guarantee in medicine is that nothing is guaranteed. It is important to note that the decision to proceed with this intervention was based on the information collected from the patient. The Data and conclusions were drawn from the patient's questionnaire, the interview, and the physical examination. Because the information was provided in large part by the patient, it cannot be guaranteed that it has not been purposely or unconsciously manipulated. Every effort has been made to obtain as much relevant data as possible for this evaluation. It is important to note that the conclusions that lead to this procedure are derived in large part from the available data. Always take into account that the treatment will also be dependent on availability of resources and existing treatment guidelines, considered by other Pain Management Practitioners as being common knowledge and practice, at the time of the intervention. For Medico-Legal purposes, it is also important to point out that variation in procedural techniques and pharmacological choices are the acceptable norm. The indications, contraindications, technique, and results of the above procedure should only be interpreted and judged by a Board-Certified Interventional Pain Specialist with extensive  familiarity and expertise in the same exact procedure and technique.

## 2022-06-02 ENCOUNTER — Encounter: Payer: Self-pay | Admitting: Student in an Organized Health Care Education/Training Program

## 2022-06-02 ENCOUNTER — Telehealth: Payer: Self-pay | Admitting: *Deleted

## 2022-06-02 ENCOUNTER — Encounter: Payer: Medicare Other | Admitting: Student in an Organized Health Care Education/Training Program

## 2022-06-02 NOTE — Telephone Encounter (Signed)
Called for post procedure check. No issues and denies pain at time of call.

## 2022-06-03 ENCOUNTER — Telehealth: Payer: Self-pay | Admitting: Student in an Organized Health Care Education/Training Program

## 2022-06-03 NOTE — Telephone Encounter (Signed)
PT stated that the Buprenorphine prescription will cost her $177 out of pocket . PT wants to see if Dr. Holley Raring can prescribed her something else. Please give patient a call. Thanks

## 2022-06-03 NOTE — Telephone Encounter (Signed)
Returned patient phone call, let her know that BL will not be in the office until Tuesday of next week and we will have to address the issue then.

## 2022-06-07 ENCOUNTER — Telehealth: Payer: Self-pay

## 2022-06-07 ENCOUNTER — Other Ambulatory Visit: Payer: Self-pay | Admitting: Cardiology

## 2022-06-07 ENCOUNTER — Telehealth: Payer: Self-pay | Admitting: Student in an Organized Health Care Education/Training Program

## 2022-06-07 ENCOUNTER — Other Ambulatory Visit: Payer: Medicare Other

## 2022-06-07 ENCOUNTER — Other Ambulatory Visit: Payer: Self-pay | Admitting: Family

## 2022-06-07 DIAGNOSIS — I10 Essential (primary) hypertension: Secondary | ICD-10-CM | POA: Diagnosis not present

## 2022-06-07 DIAGNOSIS — M47817 Spondylosis without myelopathy or radiculopathy, lumbosacral region: Secondary | ICD-10-CM

## 2022-06-07 DIAGNOSIS — E782 Mixed hyperlipidemia: Secondary | ICD-10-CM | POA: Diagnosis not present

## 2022-06-07 DIAGNOSIS — M47816 Spondylosis without myelopathy or radiculopathy, lumbar region: Secondary | ICD-10-CM

## 2022-06-07 DIAGNOSIS — G894 Chronic pain syndrome: Secondary | ICD-10-CM

## 2022-06-07 MED ORDER — TRAMADOL HCL ER 100 MG PO TB24: 100.0000 mg | ORAL_TABLET | Freq: Every day | ORAL | 1 refills | Status: DC

## 2022-06-07 NOTE — Telephone Encounter (Signed)
PT stated that the Buprenorphine prescription will cost her $177 out of pocket . PT wants to see if Dr. Holley Raring can prescribed her something else. Please give patient a call. Thanks

## 2022-06-07 NOTE — Telephone Encounter (Signed)
Dr Holley Raring, patient states she would be interested in trying the Tramadol ER.  Please send to [address]. Thanks

## 2022-06-07 NOTE — Telephone Encounter (Signed)
Attempted to call patient regarding her message and to let her know that Dr Holley Raring could prescribe Tramadol ER if she wishes.

## 2022-06-07 NOTE — Telephone Encounter (Signed)
High risk or very high risk warning populated when attempting to refill medication. RX request sent to PCP for review and approval if warranted.   

## 2022-06-08 LAB — COMPLETE METABOLIC PANEL WITH GFR
AG Ratio: 1 (calc) (ref 1.0–2.5)
ALT: 35 U/L — ABNORMAL HIGH (ref 6–29)
AST: 42 U/L — ABNORMAL HIGH (ref 10–35)
Albumin: 3.6 g/dL (ref 3.6–5.1)
Alkaline phosphatase (APISO): 199 U/L — ABNORMAL HIGH (ref 37–153)
BUN/Creatinine Ratio: 9 (calc) (ref 6–22)
BUN: 13 mg/dL (ref 7–25)
CO2: 27 mmol/L (ref 20–32)
Calcium: 9.2 mg/dL (ref 8.6–10.4)
Chloride: 103 mmol/L (ref 98–110)
Creat: 1.38 mg/dL — ABNORMAL HIGH (ref 0.50–1.03)
Globulin: 3.5 g/dL (calc) (ref 1.9–3.7)
Glucose, Bld: 93 mg/dL (ref 65–99)
Potassium: 3.9 mmol/L (ref 3.5–5.3)
Sodium: 140 mmol/L (ref 135–146)
Total Bilirubin: 0.9 mg/dL (ref 0.2–1.2)
Total Protein: 7.1 g/dL (ref 6.1–8.1)
eGFR: 45 mL/min/{1.73_m2} — ABNORMAL LOW (ref >=60)

## 2022-06-08 LAB — CBC WITH DIFFERENTIAL/PLATELET
Absolute Monocytes: 211 cells/uL (ref 200–950)
Basophils Absolute: 31 cells/uL (ref 0–200)
Basophils Relative: 0.8 %
Eosinophils Absolute: 51 cells/uL (ref 15–500)
Eosinophils Relative: 1.3 %
HCT: 31.8 % — ABNORMAL LOW (ref 35.0–45.0)
Hemoglobin: 10.9 g/dL — ABNORMAL LOW (ref 11.7–15.5)
Lymphs Abs: 2328 cells/uL (ref 850–3900)
MCH: 35.7 pg — ABNORMAL HIGH (ref 27.0–33.0)
MCHC: 34.3 g/dL (ref 32.0–36.0)
MCV: 104.3 fL — ABNORMAL HIGH (ref 80.0–100.0)
MPV: 9.3 fL (ref 7.5–12.5)
Monocytes Relative: 5.4 %
Neutro Abs: 1279 cells/uL — ABNORMAL LOW (ref 1500–7800)
Neutrophils Relative %: 32.8 %
Platelets: 262 10*3/uL (ref 140–400)
RBC: 3.05 10*6/uL — ABNORMAL LOW (ref 3.80–5.10)
RDW: 13.4 % (ref 11.0–15.0)
Total Lymphocyte: 59.7 %
WBC: 3.9 10*3/uL (ref 3.8–10.8)

## 2022-06-08 LAB — LIPID PANEL
Cholesterol: 219 mg/dL — ABNORMAL HIGH (ref <200)
HDL: 76 mg/dL (ref >=50)
LDL Cholesterol (Calc): 112 mg/dL (calc) — ABNORMAL HIGH
Non-HDL Cholesterol (Calc): 143 mg/dL (calc) — ABNORMAL HIGH (ref <130)
Total CHOL/HDL Ratio: 2.9 (calc) (ref <5.0)
Triglycerides: 194 mg/dL — ABNORMAL HIGH (ref <150)

## 2022-06-08 LAB — TSH: TSH: 1.62 mIU/L (ref 0.40–4.50)

## 2022-06-08 NOTE — Patient Instructions (Signed)
Please contact your local pharmacy, previous provider, or insurance carrier for vaccine/immunization records. Ensure that any procedures done outside of [facility] are faxed to us [phone] or you can sign release of records form at the front desk to keep your medical record updated.   ?

## 2022-06-09 ENCOUNTER — Encounter: Payer: Self-pay | Admitting: Adult Health

## 2022-06-09 ENCOUNTER — Ambulatory Visit: Payer: Medicare Other | Admitting: Adult Health

## 2022-06-09 ENCOUNTER — Telehealth (HOSPITAL_COMMUNITY): Payer: Self-pay

## 2022-06-09 VITALS — BP 154/98 | HR 110 | Temp 97.2°F | Ht 68.0 in | Wt 197.0 lb

## 2022-06-09 DIAGNOSIS — C50919 Malignant neoplasm of unspecified site of unspecified female breast: Secondary | ICD-10-CM

## 2022-06-09 DIAGNOSIS — E782 Mixed hyperlipidemia: Secondary | ICD-10-CM

## 2022-06-09 DIAGNOSIS — K219 Gastro-esophageal reflux disease without esophagitis: Secondary | ICD-10-CM

## 2022-06-09 DIAGNOSIS — F5101 Primary insomnia: Secondary | ICD-10-CM | POA: Diagnosis not present

## 2022-06-09 DIAGNOSIS — E559 Vitamin D deficiency, unspecified: Secondary | ICD-10-CM

## 2022-06-09 DIAGNOSIS — L309 Dermatitis, unspecified: Secondary | ICD-10-CM | POA: Diagnosis not present

## 2022-06-09 DIAGNOSIS — F419 Anxiety disorder, unspecified: Secondary | ICD-10-CM

## 2022-06-09 DIAGNOSIS — D649 Anemia, unspecified: Secondary | ICD-10-CM

## 2022-06-09 DIAGNOSIS — G894 Chronic pain syndrome: Secondary | ICD-10-CM | POA: Diagnosis not present

## 2022-06-09 DIAGNOSIS — I951 Orthostatic hypotension: Secondary | ICD-10-CM

## 2022-06-09 DIAGNOSIS — E876 Hypokalemia: Secondary | ICD-10-CM

## 2022-06-09 MED ORDER — ALPRAZOLAM 1 MG PO TABS: 1.0000 mg | ORAL_TABLET | Freq: Every day | ORAL | 0 refills | Status: DC

## 2022-06-09 MED ORDER — VITAMIN D (ERGOCALCIFEROL) 1.25 MG (50000 UNIT) PO CAPS: ORAL_CAPSULE | ORAL | 3 refills | Status: DC

## 2022-06-09 MED ORDER — TRIAMCINOLONE ACETONIDE 0.1 % EX CREA: 1.0000 | TOPICAL_CREAM | Freq: Two times a day (BID) | CUTANEOUS | 0 refills | Status: DC

## 2022-06-09 MED ORDER — MIDODRINE HCL 5 MG PO TABS: 5.0000 mg | ORAL_TABLET | Freq: Three times a day (TID) | ORAL | 3 refills | Status: DC

## 2022-06-09 NOTE — Progress Notes (Signed)
[facility] clinic  Provider:  Durenda Age DNP  Code Status:  Full Code  Goals of Care:     06/01/2022    8:05 AM  Advanced Directives  Does Patient Have a Medical Advance Directive? No     Chief Complaint  Patient presents with   Medical Management of Chronic Issues    6 month follow up.   Health Maintenance    Discuss the need for AWV.    Immunizations    Discuss the need for Dtap vaccine, Pap smear, and Covid Booster.    HPI: Patient is a 57 y.o. female seen today for medical management of chronic issues.  Orthostatic hypotension - BP 154/98, takes Midodrine  Gastroesophageal reflux disease without esophagitis -  denies abdominal pain, takes Omeprazole  Mixed hyperlipidemia -  takes Rosuvastatin  Chronic pain syndrome -  follows up at pain clinic, takes Buprenorphine, Robaxin and Tramadol  Hypokalemia -  K 3.9, 06/07/22, takes Klor-con  Primary insomnia -  sleeps during the day , sleeps on and off at night,  take Xanax at bedtime    Past Medical History:  Diagnosis Date   Acute pansinusitis 08/02/2017   Anxiety    Arthritis    ASD (atrial septal defect)    s/p closure with Amplatzer device 10/05/04 (Dr. Myriam Jacobson, [place]) 10/05/04   Cancer [facility])    Cataract    Dyspnea    Fatty liver 09/04/2019   GERD (gastroesophageal reflux disease)    Heart murmur    no longer heard   History of hiatal hernia    Hyperlipidemia    Legally blind in right eye, as defined in [place]    Lumbar herniated disc    PONV (postoperative nausea and vomiting)    Sciatica     Past Surgical History:  Procedure Laterality Date   ABLATION     BREAST LUMPECTOMY WITH RADIOACTIVE SEED AND SENTINEL LYMPH NODE BIOPSY Right 11/23/2020   Procedure: RIGHT BREAST LUMPECTOMY WITH RADIOACTIVE SEED AND RIGHT AXILLARY SENTINEL LYMPH NODE BIOPSY;  Surgeon: Rolm Bookbinder, MD;  Location: [place];  Service: General;  Laterality: Right;   BREAST SURGERY Bilateral 2011   Breast Reduction  Surgery   BUNIONECTOMY     CARDIAC CATHETERIZATION     10/05/04 [facility]): LM < 25%, otherwise normal coronaries. No pulmonary HTN, Mildly enlarged RV. Secundum ASD s/p closure.   CARDIAC SURGERY     CATARACT EXTRACTION     CLEFT PALATE REPAIR     s/p cleft lip and palate repair   EYE SURGERY Right 2019   right eye removed   LUMBAR LAMINECTOMY/DECOMPRESSION MICRODISCECTOMY Left 05/23/2016   Procedure: LEFT L4-L5 LATERAL RECESS DECOMPRESSION WITH CENTRAL AND RIGHT DECOMPRESSION VIA LEFT SIDE;  Surgeon: [name], MD;  Location: [place];  Service: Orthopedics;  Laterality: Left;   LUMBAR LAMINECTOMY/DECOMPRESSION MICRODISCECTOMY Left 05/23/2016   Procedure: LUMBAR LAMINECTOMY/DECOMPRESSION MICRODISCECTOMY Lumbar five - Sacral One 1 LEVEL;  Surgeon: [name], MD;  Location: [place];  Service: Orthopedics;  Laterality: Left;   REDUCTION MAMMAPLASTY  2011   SHOULDER INJECTION Left 05/23/2016   Procedure: SHOULDER INJECTION;  Surgeon: [name], MD;  Location: [place];  Service: Orthopedics;  Laterality: Left;  band-aid per pa-c   TRANSTHORACIC ECHOCARDIOGRAM     12/15/05 [facility]): Mild LVH, EF > 52%, grade 1 diastolic dysfunction, Trivial MR/PR/TR.   TUBAL LIGATION      Allergies  Allergen Reactions   Zithromax [Azithromycin] Shortness Of Breath  and Itching    TOTAL BODY ITCHING [EVEN SOLES OF FEET] WHEEZING    Tramadol Itching and Other (See Comments)    Has taken recently without any side effects.   Psyllium Nausea And Vomiting and Other (See Comments)    Metamucil. Sneezing      Outpatient Encounter Medications as of 06/09/2022  Medication Sig   abemaciclib (VERZENIO) 100 MG tablet Take 1 tablet (100 mg total) by mouth 2 (two) times daily.   acetaminophen (TYLENOL) 500 MG tablet Take 500 mg by mouth daily as needed for moderate pain.   albuterol (PROVENTIL) (2.5 MG/3ML) 0.083% nebulizer solution Take 3 mLs (2.5 mg total) by nebulization every 6 (six) hours as needed for wheezing or  shortness of breath. (Patient taking differently: Take 2.5 mg by nebulization as needed for wheezing or shortness of breath.)   ALPRAZolam (XANAX) 1 MG tablet Take 1 tablet (1 mg total) by mouth at bedtime.   amitriptyline (ELAVIL) 75 MG tablet TAKE 1 TABLET BY MOUTH AT BEDTIME   Carboxymethylcellul-Glycerin (LUBRICATING EYE DROPS OP) Place 1 drop into the right eye in the morning, at noon, and at bedtime.   diphenoxylate-atropine (LOMOTIL) 2.5-0.025 MG tablet Take 1 tablet by mouth 3 (three) times daily as needed for diarrhea or loose stools.   fluticasone-salmeterol (WIXELA INHUB) 250-50 MCG/ACT AEPB INHALE 1 INHALATION BY MOUTH  INTO THE LUNGS IN THE MORNING  AND AT BEDTIME   loperamide (IMODIUM) 2 MG capsule Take 1-2 capsules (2-4 mg total) by mouth 4 (four) times daily as needed for diarrhea or loose stools. (Patient taking differently: Take 4 mg by mouth as needed for diarrhea or loose stools.)   methocarbamol (ROBAXIN) 500 MG tablet Take 1 tablet (500 mg total) by mouth every 6 (six) hours as needed for muscle spasms. (Patient taking differently: Take 500 mg by mouth 3 (three) times daily as needed for muscle spasms.)   midodrine (PROAMATINE) 10 MG tablet Take 1 tablet (10 mg total) by mouth 3 (three) times daily.   Multiple Vitamin (MULTIVITAMIN WITH MINERALS) TABS tablet Take 1 tablet by mouth daily.   omeprazole (PRILOSEC OTC) 20 MG tablet Take 20 mg by mouth daily.   potassium chloride SA (KLOR-CON M) 20 MEQ tablet Take 1 tablet (20 mEq total) by mouth 2 (two) times daily. For 14 days   rosuvastatin (CRESTOR) 10 MG tablet TAKE 1 TABLET(10 MG) BY MOUTH DAILY   traMADol (ULTRAM-ER) 100 MG 24 hr tablet Take 1 tablet (100 mg total) by mouth daily.   triamcinolone cream (KENALOG) 0.1 % Apply 1 Application topically 2 (two) times daily.   Vitamin D, Ergocalciferol, (DRISDOL) 1.25 MG (50000 UNIT) CAPS capsule TAKE 1 CAPSULE ONCE PER WEEK (Patient taking differently: Take 50,000 Units by mouth  every 7 (seven) days.)   White Petrolatum-Mineral Oil (LUBRICANT EYE NIGHTTIME) OINT Place 1 drop into the left eye at bedtime as needed (dryness).   XIIDRA 5 % SOLN Place 1 drop into the left eye in the morning, at noon, and at bedtime.   Buprenorphine HCl (BELBUCA) 300 MCG FILM Place 1 Film inside cheek every 12 (twelve) hours. (Patient not taking: Reported on 06/09/2022)   No facility-administered encounter medications on file as of 06/09/2022.    Review of Systems:  Review of Systems  Constitutional:  Negative for appetite change, chills, fatigue and fever.  HENT:  Negative for congestion, hearing loss, rhinorrhea and sore throat.   Eyes: Negative.   Respiratory:  Negative for cough, shortness of breath  and wheezing.   Cardiovascular:  Negative for chest pain, palpitations and leg swelling.  Gastrointestinal:  Negative for abdominal pain, constipation, diarrhea, nausea and vomiting.  Genitourinary:  Negative for dysuria.  Musculoskeletal:  Negative for arthralgias, back pain and myalgias.  Skin:  Negative for color change, rash and wound.       Dry skin on both palms  Neurological:  Negative for dizziness, weakness and headaches.  Psychiatric/Behavioral:  Negative for behavioral problems. The patient is not nervous/anxious.     Health Maintenance  Topic Date Due   Medicare Annual Wellness (AWV)  Never done   DTaP/Tdap/Td (1 - Tdap) Never done   PAP SMEAR-Modifier  Never done   COVID-19 Vaccine (5 - 2023-24 season) 02/04/2022   MAMMOGRAM  01/05/2024   COLONOSCOPY (Pts 45-51yr Insurance coverage will need to be confirmed)  08/16/2027   INFLUENZA VACCINE  Completed   Hepatitis C Screening  Completed   HIV Screening  Completed   Zoster Vaccines- Shingrix  Completed   HPV VACCINES  Aged Out    Physical Exam: Vitals:   06/09/22 0851  Weight: 197 lb (89.4 kg)  Height: '5\' 8"'$  (1.727 m)   Body mass index is 29.95 kg/m. Physical Exam Constitutional:      General: She is not in  acute distress.    Appearance: Normal appearance.  HENT:     Head: Normocephalic and atraumatic.     Nose: Nose normal.     Mouth/Throat:     Mouth: Mucous membranes are moist.  Eyes:     Conjunctiva/sclera: Conjunctivae normal.     Comments: Right eye prosthesis  Cardiovascular:     Rate and Rhythm: Normal rate and regular rhythm.  Pulmonary:     Effort: Pulmonary effort is normal.     Breath sounds: Normal breath sounds.  Abdominal:     General: Bowel sounds are normal.     Palpations: Abdomen is soft.  Musculoskeletal:        General: Normal range of motion.     Cervical back: Normal range of motion.  Skin:    General: Skin is warm and dry.     Findings: Rash present.     Comments: Bilateral palms have dry skin  Neurological:     General: No focal deficit present.     Mental Status: She is alert and oriented to person, place, and time.  Psychiatric:        Mood and Affect: Mood normal.        Behavior: Behavior normal.        Thought Content: Thought content normal.        Judgment: Judgment normal.     Labs reviewed: Basic Metabolic Panel: Recent Labs    04/24/22 1545 04/25/22 0844 04/26/22 0518 05/06/22 0958 05/24/22 0827 06/07/22 0851  NA 140 143 140 137 143 140  K 2.0* 3.1* 3.1* 4.2 2.6* 3.9  CL 91* 97* 97* 94* 102 103  CO2 37* 37* 35* '29 31 27  '$ GLUCOSE 118* 100* 104* 144* 113* 93  BUN '10 8 8 21 12 13  '$ CREATININE 1.54* 1.27* 1.12* 1.84* 1.66* 1.38*  CALCIUM 8.0* 7.7* 7.9* 10.4* 8.9 9.2  MG 1.3* 1.5* 1.8 1.7  --   --   PHOS 1.9* 1.7* 2.6  --   --   --   TSH  --   --   --   --   --  1.62   Liver Function Tests: Recent Labs  04/25/22 0844 05/06/22 0958 05/24/22 0827 06/07/22 0851  AST 42* 51* 59* 42*  ALT 28 47* 46* 35*  ALKPHOS 113 275* 211*  --   BILITOT 1.7* 0.8 0.8 0.9  PROT 6.5 7.6 7.3 7.1  ALBUMIN 2.8* 4.0 3.1*  --    Recent Labs    07/12/21 1246  LIPASE 35   No results for input(s): "AMMONIA" in the last 8760  hours. CBC: Recent Labs    04/22/22 0803 04/24/22 1545 05/06/22 0958 05/24/22 0827 06/07/22 0851  WBC 5.1   < > 5.4 4.9 3.9  NEUTROABS 1.9  --   --  1.6* 1,279*  HGB 11.0*   < > 12.5 10.6* 10.9*  HCT 32.8*   < > 37.2 30.0* 31.8*  MCV 105.5*   < > 105* 103.4* 104.3*  PLT 195   < > 288 186 262   < > = values in this interval not displayed.   Lipid Panel: Recent Labs    10/26/21 1101 06/07/22 0851  CHOL 166 219*  HDL 74 76  LDLCALC 67 112*  TRIG 172* 194*  CHOLHDL 2.2 2.9   No results found for: "HGBA1C"  Procedures since last visit: [place] C-ARM 1-60 MIN NO REPORT  Result Date: 06/01/2022 Fluoro was used, but no Radiologist interpretation will be provided. Please refer to "NOTES" tab for provider progress note.  NM PET Image Restag (PS) Skull Base To Thigh  Result Date: 05/15/2022 CLINICAL DATA:  Subsequent treatment strategy for breast cancer. EXAM: NUCLEAR MEDICINE PET SKULL BASE TO THIGH TECHNIQUE: 9.8 mCi F-18 FDG was injected intravenously. Full-ring PET imaging was performed from the skull base to thigh after the radiotracer. CT data was obtained and used for attenuation correction and anatomic localization. Fasting blood glucose: 106 mg/dl COMPARISON:  PET-CT 08/07/2020 FINDINGS: Mediastinal blood pool activity: SUV max 3.0 Liver activity: SUV max NA NECK: Choose 1 Incidental CT findings: None. CHEST: No hypermetabolic breast masses, supraclavicular or axillary adenopathy. No enlarged or hypermetabolic mediastinal or hilar lymph nodes. Stable 8 mm right apical lung nodule on image 12/7. There is also a 3 mm left upper lobe nodule on image number 12/7 which is stable. No new pulmonary lesions or pulmonary nodules. No pleural effusions or pleural nodules. Incidental CT findings: Stable postoperative changes involving the right breast. Stable vascular calcifications. ASD closure devices noted. ABDOMEN/PELVIS: No abnormal hypermetabolic activity within the liver,  pancreas, adrenal glands, or spleen. No hypermetabolic lymph nodes in the abdomen or pelvis. Incidental CT findings: Multiple scattered pill fragments are noted in the bowel. SKELETON: No findings suspicious for osseous metastatic disease. Incidental CT findings: No lytic or sclerotic bone lesions. IMPRESSION: 1. No findings for recurrent breast cancer, locoregional adenopathy or metastatic disease. 2. Stable biapical pulmonary nodules. Electronically Signed   By: Marijo Sanes M.D.   On: 05/15/2022 12:36    Assessment/Plan  1. Orthostatic hypotension -  BP  154/98 -  will decrease Midodrine dosage from 10 mg  TID to 5 mg TID - midodrine (PROAMATINE) 5 MG tablet; Take 1 tablet (5 mg total) by mouth 3 (three) times daily with meals.  Dispense: 90 tablet; Refill: 3  2. Gastroesophageal reflux disease without esophagitis -  stable, continue Omeprazole  3. Mixed hyperlipidemia Lab Results  Component Value Date   CHOL 219 (H) 06/07/2022   HDL 76 06/07/2022   LDLCALC 112 (H) 06/07/2022   TRIG 194 (H) 06/07/2022   CHOLHDL 2.9 06/07/2022   -  continue Rosuvastatin  4. Chronic pain syndrome -  stable -  takes Buprenorphine, tramadol and Robaxin -  follows up in pain clinic  5. Hypokalemia Lab Results  Component Value Date   K 3.9 06/07/2022   -  continue Klor-con  6. Primary insomnia - ALPRAZolam (XANAX) 1 MG tablet; Take 1 tablet (1 mg total) by mouth at bedtime.  Dispense: 30 tablet; Refill: 0  7. Primary malignant neoplasm of breast with metastasis ([place]) -  continue Verzenio -  follows up at [facility] - Vitamin D, Ergocalciferol, (DRISDOL) 1.25 MG (50000 UNIT) CAPS capsule; TAKE 1 CAPSULE ONCE PER WEEK  Dispense: 12 capsule; Refill: 3  8. Anxiety disorder, unspecified type -  continue Xanax - ALPRAZolam (XANAX) 1 MG tablet; Take 1 tablet (1 mg total) by mouth at bedtime.  Dispense: 30 tablet; Refill: 0  9. Eczema of both hands - triamcinolone cream (KENALOG) 0.1 %; Apply  1 Application topically 2 (two) times daily.  Dispense: 30 g; Refill: 0    Labs/tests ordered:  None  Next appt:  in 3 months

## 2022-06-09 NOTE — Telephone Encounter (Signed)
Pt insurance is active and benefits verified through [facility]. Co-pay $15.00, DED $0.00/$0.00 met, out of pocket $3,600.00/$0.00 met, co-insurance 0%. No pre-authorization required. [place] L./[employer] Medicare, 06/09/22 @ 3:54PM, YQM#[id number]

## 2022-06-09 NOTE — Telephone Encounter (Signed)
Called patient to see if she was interested in participating in the Pulmonary Rehab Program. Patient stated yes. Patient will come in for orientation on 06/13/22 @ 9AM and will attend the 1:15PM exercise class. Went over insurance, patient verbalized understanding.   Tourist information centre manager.

## 2022-06-10 ENCOUNTER — Telehealth (HOSPITAL_COMMUNITY): Payer: Self-pay

## 2022-06-10 NOTE — Telephone Encounter (Signed)
Called pt to confirm orientation appointment. Left message with call back number.

## 2022-06-12 DIAGNOSIS — G4733 Obstructive sleep apnea (adult) (pediatric): Secondary | ICD-10-CM | POA: Diagnosis not present

## 2022-06-13 ENCOUNTER — Ambulatory Visit (HOSPITAL_COMMUNITY): Payer: Medicare Other

## 2022-06-13 ENCOUNTER — Telehealth (HOSPITAL_COMMUNITY): Payer: Self-pay

## 2022-06-13 DIAGNOSIS — I951 Orthostatic hypotension: Secondary | ICD-10-CM | POA: Diagnosis not present

## 2022-06-13 DIAGNOSIS — C50919 Malignant neoplasm of unspecified site of unspecified female breast: Secondary | ICD-10-CM | POA: Diagnosis not present

## 2022-06-13 DIAGNOSIS — N1831 Chronic kidney disease, stage 3a: Secondary | ICD-10-CM | POA: Diagnosis not present

## 2022-06-13 NOTE — Telephone Encounter (Signed)
Pt called wanting to reschedule her pulmonary rehab orientation to 06/15/22

## 2022-06-15 ENCOUNTER — Encounter (HOSPITAL_COMMUNITY)
Admission: RE | Admit: 2022-06-15 | Discharge: 2022-06-15 | Disposition: A | Discharge status: Home / Self Care | Payer: Medicare Other | Source: Ambulatory Visit | Attending: Pulmonary Disease | Admitting: Pulmonary Disease

## 2022-06-15 ENCOUNTER — Encounter (HOSPITAL_COMMUNITY): Payer: Self-pay

## 2022-06-15 VITALS — BP 118/66 | HR 100 | Resp 20 | Ht 67.0 in | Wt 197.5 lb

## 2022-06-15 DIAGNOSIS — R0602 Shortness of breath: Secondary | ICD-10-CM | POA: Diagnosis not present

## 2022-06-15 DIAGNOSIS — Z9981 Dependence on supplemental oxygen: Secondary | ICD-10-CM

## 2022-06-15 DIAGNOSIS — G4733 Obstructive sleep apnea (adult) (pediatric): Secondary | ICD-10-CM

## 2022-06-15 HISTORY — DX: Chronic kidney disease, unspecified: N18.9

## 2022-06-15 HISTORY — DX: Migraine, unspecified, not intractable, without status migrainosus: G43.909

## 2022-06-15 HISTORY — DX: Other chronic pain: G89.29

## 2022-06-15 HISTORY — DX: Dependence on supplemental oxygen: Z99.81

## 2022-06-15 HISTORY — DX: Obstructive sleep apnea (adult) (pediatric): G47.33

## 2022-06-15 NOTE — Progress Notes (Signed)
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 promotes 1-2 lb weight loss per week and includes a negative energy balance of [phone] kcal/d    Improve shortness of breath with ADL's Yes    Intervention Provide education, individualized exercise plan and daily activity instruction to help decrease symptoms of SOB with activities of daily living.    Expected Outcomes Short Term: Improve cardiorespiratory fitness to achieve a reduction of symptoms when performing ADLs;Long Term: Be able to perform more ADLs without symptoms or delay the onset of symptoms    Increase knowledge of respiratory medications and ability to use respiratory devices properly  Yes    Intervention Provide education and demonstration as needed of appropriate use of medications, inhalers, and oxygen therapy.    Expected  Outcomes Short Term: Achieves understanding of medications use. Understands that oxygen is a medication prescribed by physician. Demonstrates appropriate use of inhaler and oxygen therapy.;Long Term: Maintain appropriate use of medications, inhalers, and oxygen therapy.    Stress Yes    Intervention Offer individual and/or small group education and counseling on adjustment to heart disease, stress management and health-related lifestyle change. Teach and support self-help strategies.;Refer participants experiencing significant psychosocial distress to appropriate mental health specialists for further evaluation and treatment. When possible, include family members and significant others in education/counseling sessions.    Expected Outcomes Short Term: Participant demonstrates changes in health-related behavior, relaxation and other stress management skills, ability to obtain effective social support, and compliance with psychotropic medications if prescribed.;Long Term: Emotional wellbeing is indicated by absence of clinically significant psychosocial distress or social isolation.             Core Components/Risk Factors/Patient Goals Review:    Core Components/Risk Factors/Patient Goals at Discharge (Final Review):    ITP Comments:   Comments: Dr. Rodman Pickle is the Medical Director for [facility]

## 2022-06-15 NOTE — Progress Notes (Signed)
[place] 57 y.o. female  Pulmonary Rehab Orientation Note  This patient who was referred to Pulmonary Rehab by Dr. [name] with the diagnosis of Shortness of Breath arrived today in Cardiac and Pulmonary Rehab. She  arrived ambulatory with assistive device with normal gait. She  sometimes carries portable oxygen. [employer] is the provider for their DME. Per patient, [facility] uses oxygen occasionally.   Patient is oriented to time and place. Patient's medical history, psychosocial health, and medications reviewed. Psychosocial assessment reveals patient lives with spouse. Yuvonne is currently unemployed, disabled. 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 Patient hobbies include watching tv and spending time with her children and grandchildren.   Patient reports her stress level is high. Areas of stress/anxiety include health, family and fiances. Patient does exhibit signs of depression. Signs of depression include anxiety and fatigue. PHQ2/9 score 0/2. Tameisha shows good  coping skills with positive outlook on life. She states that "2024 is the year of [place]". RN offered emotional support and reassurance. We will continue to monitor and evaluate progress toward psychosocial goal(s) of seeing a therapist and referral placed to our social worker.  Physical assessment reveals pt is alert and oriented x 4.  Heart rate is tachycardic, regular rhythm. Pt states her heart rate runs in the 80s-130s. We will discuss this with Dr. [name]. Breath sounds are clear to auscultation, no wheezes, rales, or rhonchi. Pt denies congestion and cough. Bowel sounds present in all 4 quadrants.  Pt denies abdominal discomfort, nausea, vomiting or diarrhea at this time but endorses these symptoms as a side effect of taking abemaciclib. Grip strength equal, strong. Distal pulses +2; no swelling to lower extremities at this time. Pt is wearing bilateral TED hose. Pt denies open wounds or  sores.  Jose reports she does take medications as prescribed.   Patient states she follows a regular  diet. The patient has been trying to lose weight through a healthy diet and exercise program.. Pt's weight will be monitored closely. Demonstration and practice of PLB using pulse oximeter. At[place]aya able to return demonstration satisfactorily. Safety and hand hygiene in the exercise area reviewed with patient. Asiyah voices understanding of the information reviewed. Department expectations discussed with patient and achievable goals were set. The patient shows enthusiasm about attending the program and we look forward to working with Estill Bamberg. Morrigan completed a 6 min walk test today and is scheduled to begin exercise on 06/21/22 at 1015.   [id number] Janine Ores, RN, BSN

## 2022-06-16 ENCOUNTER — Telehealth: Payer: Self-pay | Admitting: Pulmonary Disease

## 2022-06-16 ENCOUNTER — Other Ambulatory Visit: Payer: Self-pay

## 2022-06-16 MED ORDER — EZETIMIBE 10 MG PO TABS: 10.0000 mg | ORAL_TABLET | Freq: Every day | ORAL | 1 refills | Status: DC

## 2022-06-16 MED ORDER — ALBUTEROL SULFATE (2.5 MG/3ML) 0.083% IN NEBU: 2.5000 mg | INHALATION_SOLUTION | Freq: Four times a day (QID) | RESPIRATORY_TRACT | 1 refills | Status: DC | PRN

## 2022-06-16 MED ORDER — ALBUTEROL SULFATE HFA 108 (90 BASE) MCG/ACT IN AERS: 2.0000 | INHALATION_SPRAY | Freq: Four times a day (QID) | RESPIRATORY_TRACT | 6 refills | Status: AC | PRN

## 2022-06-16 NOTE — Telephone Encounter (Signed)
Called and spoke with patient. She stated that she is switching pharmacies to [facility] [address] and needs her RXs sent to them. At the moment, she only needs refills on her albuterol HFA and nebulizer solutions.   I advised her that I would go ahead and send in the RXs for her. She verbalized understanding.   Nothing further needed at time of call.

## 2022-06-16 NOTE — Telephone Encounter (Signed)
Patient called to inform the doctor that she is switching pharmacies and all her meds should be sent to [facility] at the [address] locations.  She also stated she needs a refill on her nebulizers and the Albuterol. Please advise.

## 2022-06-17 ENCOUNTER — Other Ambulatory Visit: Payer: Self-pay

## 2022-06-17 ENCOUNTER — Inpatient Hospital Stay: Payer: Medicare Other | Admitting: Hematology

## 2022-06-17 ENCOUNTER — Inpatient Hospital Stay: Payer: Medicare Other | Attending: Hematology

## 2022-06-17 ENCOUNTER — Inpatient Hospital Stay: Payer: Medicare Other

## 2022-06-17 DIAGNOSIS — Z87891 Personal history of nicotine dependence: Secondary | ICD-10-CM | POA: Insufficient documentation

## 2022-06-17 DIAGNOSIS — R110 Nausea: Secondary | ICD-10-CM | POA: Insufficient documentation

## 2022-06-17 DIAGNOSIS — K760 Fatty (change of) liver, not elsewhere classified: Secondary | ICD-10-CM | POA: Insufficient documentation

## 2022-06-17 DIAGNOSIS — Z8249 Family history of ischemic heart disease and other diseases of the circulatory system: Secondary | ICD-10-CM | POA: Insufficient documentation

## 2022-06-17 DIAGNOSIS — C7802 Secondary malignant neoplasm of left lung: Secondary | ICD-10-CM | POA: Insufficient documentation

## 2022-06-17 DIAGNOSIS — Z801 Family history of malignant neoplasm of trachea, bronchus and lung: Secondary | ICD-10-CM | POA: Insufficient documentation

## 2022-06-17 DIAGNOSIS — C50511 Malignant neoplasm of lower-outer quadrant of right female breast: Secondary | ICD-10-CM | POA: Insufficient documentation

## 2022-06-17 DIAGNOSIS — I700 Atherosclerosis of aorta: Secondary | ICD-10-CM | POA: Insufficient documentation

## 2022-06-17 DIAGNOSIS — K219 Gastro-esophageal reflux disease without esophagitis: Secondary | ICD-10-CM | POA: Insufficient documentation

## 2022-06-17 DIAGNOSIS — C7801 Secondary malignant neoplasm of right lung: Secondary | ICD-10-CM | POA: Insufficient documentation

## 2022-06-17 DIAGNOSIS — Z833 Family history of diabetes mellitus: Secondary | ICD-10-CM | POA: Insufficient documentation

## 2022-06-17 DIAGNOSIS — G43909 Migraine, unspecified, not intractable, without status migrainosus: Secondary | ICD-10-CM | POA: Insufficient documentation

## 2022-06-17 DIAGNOSIS — E876 Hypokalemia: Secondary | ICD-10-CM | POA: Insufficient documentation

## 2022-06-17 DIAGNOSIS — Z8261 Family history of arthritis: Secondary | ICD-10-CM | POA: Insufficient documentation

## 2022-06-17 DIAGNOSIS — Z5111 Encounter for antineoplastic chemotherapy: Secondary | ICD-10-CM | POA: Insufficient documentation

## 2022-06-17 DIAGNOSIS — Z170 Estrogen receptor positive status [ER+]: Secondary | ICD-10-CM | POA: Insufficient documentation

## 2022-06-21 ENCOUNTER — Encounter (HOSPITAL_COMMUNITY)
Admission: RE | Admit: 2022-06-21 | Discharge: 2022-06-21 | Disposition: A | Discharge status: Home / Self Care | Payer: Medicare Other | Source: Ambulatory Visit | Attending: Pulmonary Disease | Admitting: Pulmonary Disease

## 2022-06-21 VITALS — Wt 195.1 lb

## 2022-06-21 DIAGNOSIS — R0602 Shortness of breath: Secondary | ICD-10-CM

## 2022-06-21 NOTE — Progress Notes (Signed)
Daily Session Note  Patient Details  Name: Brandy [name] MRN: [id number] Date of Birth: 12-25-1965 Referring Provider:   April Manson Pulmonary Rehab Walk Test from 06/15/2022 in [facility]  Referring Provider [name]       Encounter Date: 06/21/2022  Check In:  Session Check In - 06/21/22 1129       Check-In   Supervising physician immediately available to respond to emergencies [facility] MD immediately available    Physician(s) Eric Form, NP    Location [facility]    Staff Present Janine Ores, RN, BSN;Randi Olen Cordial BS, ACSM-CEP, Exercise Physiologist;Samantha [place], [place], Philip Aspen, MS, ACSM-CEP, Exercise Physiologist;Other    Virtual Visit No    Medication changes reported     No    Fall or balance concerns reported    No    Tobacco Cessation No Change    Warm-up and Cool-down Performed as group-led instruction    Resistance Training Performed Yes    VAD Patient? No    PAD/SET Patient? No      Pain Assessment   Currently in Pain? No/denies             Capillary Blood Glucose: No results found for this or any previous visit (from the past 24 hour(s)).   Exercise Prescription Changes - 06/21/22 1200       Response to Exercise   Blood Pressure (Admit) 132/80    Blood Pressure (Exercise) 132/68    Blood Pressure (Exit) 120/80    Heart Rate (Admit) 117 bpm    Heart Rate (Exercise) 127 bpm    Heart Rate (Exit) 112 bpm    Oxygen Saturation (Admit) 98 %    Oxygen Saturation (Exercise) 95 %    Oxygen Saturation (Exit) 100 %    Rating of Perceived Exertion (Exercise) 11    Perceived Dyspnea (Exercise) 0    Duration Progress to 30 minutes of  aerobic without signs/symptoms of physical distress    Intensity THRR unchanged      Progression   Progression Continue to progress workloads to maintain intensity without signs/symptoms of physical distress.      Resistance  Training   Training Prescription Yes    Weight red bands    Reps 10-15    Time 10 Minutes      NuStep   Level 1    SPM 60    Minutes 15    METs 1.8      Recumbant Elliptical   Level 2    Minutes 15    METs 3.6             Social History   Tobacco Use  Smoking Status Former   Packs/day: 0.25   Years: 25.00   Total pack years: 6.25   Types: Cigarettes   Quit date: 02/04/2018   Years since quitting: 4.3  Smokeless Tobacco Never    Goals Met:  Proper associated with RPD/PD & O2 Sat Exercise tolerated well No report of concerns or symptoms today Strength training completed today  Goals Unmet:  Not Applicable  Comments: Service time is from 1029 to 1144.    Dr. Rodman Pickle is Medical Director for Pulmonary Rehab at [facility].

## 2022-06-22 ENCOUNTER — Telehealth: Payer: Self-pay | Admitting: Licensed Clinical Social Worker
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
## 2022-06-22 NOTE — Telephone Encounter (Signed)
H&V Care Navigation CSW Progress Note  Clinical Social Worker contacted patient by phone to f/u on referral from cardiac rehab. No answer at [phone], left voicemail requesting return call. Will re-attempt as able.   Patient is participating in a Managed Medicaid Plan:  No, [facility] Medicare  [place]: Food Insecurity Present (04/24/2022)  Housing: Medium Risk (04/24/2022)  Transportation Needs: Unmet Transportation Needs (04/24/2022)  Utilities: At Risk (04/24/2022)  Depression (PHQ2-9): Low Risk  (06/15/2022)  Tobacco Use: Medium Risk (06/15/2022)   Westley Hummer, MSW, [place]  [phone]- work cell phone (preferred) [phone]- desk phone

## 2022-06-23 ENCOUNTER — Encounter (HOSPITAL_COMMUNITY): Payer: Medicare Other

## 2022-06-23 ENCOUNTER — Telehealth (HOSPITAL_COMMUNITY): Payer: Self-pay

## 2022-06-23 ENCOUNTER — Telehealth: Payer: Self-pay | Admitting: Licensed Clinical Social Worker

## 2022-06-23 NOTE — Telephone Encounter (Signed)
H&V Care Navigation CSW Progress Note  Clinical Social Worker contacted patient by phone to f/u on referral from cardiac rehab. No answer at [phone], left second voicemail requesting return call. Will re-attempt as able.    Patient is participating in a Managed Medicaid Plan:  No, [facility] Medicare    [place]: Food Insecurity Present (04/24/2022)  Housing: Medium Risk (04/24/2022)  Transportation Needs: Unmet Transportation Needs (04/24/2022)  Utilities: At Risk (04/24/2022)  Depression (PHQ2-9): Low Risk  (06/15/2022)  Tobacco Use: Medium Risk (06/15/2022)    Westley Hummer, MSW, [place]  [phone]- work cell phone (preferred) [phone]- desk phone

## 2022-06-23 NOTE — Telephone Encounter (Signed)
Called to check on Brandy [name] since she missed Pulm Rehab class. Left a message for her to return call.

## 2022-06-24 ENCOUNTER — Inpatient Hospital Stay: Payer: Medicare Other

## 2022-06-24 ENCOUNTER — Telehealth (HOSPITAL_COMMUNITY): Payer: Self-pay

## 2022-06-24 ENCOUNTER — Other Ambulatory Visit: Payer: Self-pay

## 2022-06-24 ENCOUNTER — Inpatient Hospital Stay: Payer: Medicare Other | Admitting: Hematology

## 2022-06-24 VITALS — BP 131/65 | HR 98 | Temp 97.9°F | Resp 18 | Wt 192.3 lb

## 2022-06-24 DIAGNOSIS — C50511 Malignant neoplasm of lower-outer quadrant of right female breast: Secondary | ICD-10-CM | POA: Diagnosis not present

## 2022-06-24 DIAGNOSIS — E876 Hypokalemia: Secondary | ICD-10-CM | POA: Diagnosis not present

## 2022-06-24 DIAGNOSIS — Z8249 Family history of ischemic heart disease and other diseases of the circulatory system: Secondary | ICD-10-CM | POA: Diagnosis not present

## 2022-06-24 DIAGNOSIS — C7802 Secondary malignant neoplasm of left lung: Secondary | ICD-10-CM | POA: Diagnosis not present

## 2022-06-24 DIAGNOSIS — K760 Fatty (change of) liver, not elsewhere classified: Secondary | ICD-10-CM | POA: Diagnosis not present

## 2022-06-24 DIAGNOSIS — C50919 Malignant neoplasm of unspecified site of unspecified female breast: Secondary | ICD-10-CM

## 2022-06-24 DIAGNOSIS — C7801 Secondary malignant neoplasm of right lung: Secondary | ICD-10-CM | POA: Diagnosis not present

## 2022-06-24 DIAGNOSIS — K219 Gastro-esophageal reflux disease without esophagitis: Secondary | ICD-10-CM | POA: Diagnosis not present

## 2022-06-24 DIAGNOSIS — G43909 Migraine, unspecified, not intractable, without status migrainosus: Secondary | ICD-10-CM | POA: Diagnosis not present

## 2022-06-24 DIAGNOSIS — Z8261 Family history of arthritis: Secondary | ICD-10-CM | POA: Diagnosis not present

## 2022-06-24 DIAGNOSIS — Z801 Family history of malignant neoplasm of trachea, bronchus and lung: Secondary | ICD-10-CM | POA: Diagnosis not present

## 2022-06-24 DIAGNOSIS — Z87891 Personal history of nicotine dependence: Secondary | ICD-10-CM | POA: Diagnosis not present

## 2022-06-24 DIAGNOSIS — I700 Atherosclerosis of aorta: Secondary | ICD-10-CM | POA: Diagnosis not present

## 2022-06-24 DIAGNOSIS — R110 Nausea: Secondary | ICD-10-CM | POA: Diagnosis not present

## 2022-06-24 DIAGNOSIS — Z833 Family history of diabetes mellitus: Secondary | ICD-10-CM | POA: Diagnosis not present

## 2022-06-24 DIAGNOSIS — Z5111 Encounter for antineoplastic chemotherapy: Secondary | ICD-10-CM | POA: Diagnosis not present

## 2022-06-24 DIAGNOSIS — Z170 Estrogen receptor positive status [ER+]: Secondary | ICD-10-CM | POA: Diagnosis not present

## 2022-06-24 LAB — CMP (CANCER CENTER ONLY)
ALT: 36 U/L (ref 0–44)
AST: 41 U/L (ref 15–41)
Albumin: 3.8 g/dL (ref 3.5–5.0)
Alkaline Phosphatase: 190 U/L — ABNORMAL HIGH (ref 38–126)
Anion gap: 8 (ref 5–15)
BUN: 12 mg/dL (ref 6–20)
CO2: 32 mmol/L (ref 22–32)
Calcium: 9.7 mg/dL (ref 8.9–10.3)
Chloride: 101 mmol/L (ref 98–111)
Creatinine: 1.91 mg/dL — ABNORMAL HIGH (ref 0.44–1.00)
GFR, Estimated: 30 mL/min — ABNORMAL LOW (ref >60)
Glucose, Bld: 96 mg/dL (ref 70–99)
Potassium: 3.8 mmol/L (ref 3.5–5.1)
Sodium: 141 mmol/L (ref 135–145)
Total Bilirubin: 1 mg/dL (ref 0.3–1.2)
Total Protein: 7.5 g/dL (ref 6.5–8.1)

## 2022-06-24 LAB — CBC WITH DIFFERENTIAL (CANCER CENTER ONLY)
Abs Immature Granulocytes: 0.01 10*3/uL (ref 0.00–0.07)
Basophils Absolute: 0 10*3/uL (ref 0.0–0.1)
Basophils Relative: 1 %
Eosinophils Absolute: 0 10*3/uL (ref 0.0–0.5)
Eosinophils Relative: 1 %
HCT: 35.4 % — ABNORMAL LOW (ref 36.0–46.0)
Hemoglobin: 12 g/dL (ref 12.0–15.0)
Immature Granulocytes: 0 %
Lymphocytes Relative: 51 %
Lymphs Abs: 3.2 10*3/uL (ref 0.7–4.0)
MCH: 36.4 pg — ABNORMAL HIGH (ref 26.0–34.0)
MCHC: 33.9 g/dL (ref 30.0–36.0)
MCV: 107.3 fL — ABNORMAL HIGH (ref 80.0–100.0)
Monocytes Absolute: 0.5 10*3/uL (ref 0.1–1.0)
Monocytes Relative: 8 %
Neutro Abs: 2.4 10*3/uL (ref 1.7–7.7)
Neutrophils Relative %: 39 %
Platelet Count: 227 10*3/uL (ref 150–400)
RBC: 3.3 MIL/uL — ABNORMAL LOW (ref 3.87–5.11)
RDW: 15 % (ref 11.5–15.5)
WBC Count: 6.1 10*3/uL (ref 4.0–10.5)
nRBC: 0 % (ref 0.0–0.2)

## 2022-06-24 MED ORDER — FULVESTRANT 250 MG/5ML IM SOSY
500.0000 mg | PREFILLED_SYRINGE | Freq: Once | INTRAMUSCULAR | Status: AC
  Administered 2022-06-24: 500 mg via INTRAMUSCULAR
  Filled 2022-06-24: qty 10
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
## 2022-06-24 NOTE — Patient Instructions (Signed)

## 2022-06-24 NOTE — Telephone Encounter (Signed)
Pt called returning a phone call about her missing her pulmonary rehab session on 1/18. Pt stated that she wasn't feeling well.

## 2022-06-26 DIAGNOSIS — R0600 Dyspnea, unspecified: Secondary | ICD-10-CM | POA: Diagnosis not present

## 2022-06-27 ENCOUNTER — Other Ambulatory Visit: Payer: Self-pay | Admitting: Pulmonary Disease

## 2022-06-27 ENCOUNTER — Telehealth: Payer: Self-pay | Admitting: Licensed Clinical Social Worker

## 2022-06-27 ENCOUNTER — Encounter: Payer: Self-pay | Admitting: Student in an Organized Health Care Education/Training Program

## 2022-06-27 ENCOUNTER — Ambulatory Visit
Admission: RE | Admit: 2022-06-27 | Discharge: 2022-06-27 | Disposition: A | Discharge status: Home / Self Care | Payer: Medicare Other | Source: Ambulatory Visit | Attending: Student in an Organized Health Care Education/Training Program | Admitting: Student in an Organized Health Care Education/Training Program

## 2022-06-27 ENCOUNTER — Ambulatory Visit
Payer: Medicare Other | Attending: Student in an Organized Health Care Education/Training Program | Admitting: Student in an Organized Health Care Education/Training Program

## 2022-06-27 VITALS — BP 128/75 | HR 106 | Temp 97.9°F | Resp 18 | Ht 67.0 in | Wt 195.0 lb

## 2022-06-27 DIAGNOSIS — M47816 Spondylosis without myelopathy or radiculopathy, lumbar region: Secondary | ICD-10-CM

## 2022-06-27 DIAGNOSIS — G894 Chronic pain syndrome: Secondary | ICD-10-CM | POA: Insufficient documentation

## 2022-06-27 DIAGNOSIS — M47817 Spondylosis without myelopathy or radiculopathy, lumbosacral region: Secondary | ICD-10-CM

## 2022-06-27 MED ORDER — LIDOCAINE HCL 2 % IJ SOLN
20.0000 mL | Freq: Once | INTRAMUSCULAR | Status: AC
  Administered 2022-06-27: 400 mg
  Filled 2022-06-27: qty 20

## 2022-06-27 MED ORDER — ROPIVACAINE HCL 2 MG/ML IJ SOLN
9.0000 mL | Freq: Once | INTRAMUSCULAR | Status: AC
  Administered 2022-06-27: 20 mL via PERINEURAL
  Filled 2022-06-27: qty 20

## 2022-06-27 MED ORDER — LACTATED RINGERS IV SOLN: Freq: Once | INTRAVENOUS | Status: DC

## 2022-06-27 MED ORDER — MIDAZOLAM HCL 5 MG/5ML IJ SOLN
0.5000 mg | Freq: Once | INTRAMUSCULAR | Status: AC
  Administered 2022-06-27: 1.5 mg via INTRAVENOUS

## 2022-06-27 MED ORDER — DEXAMETHASONE SODIUM PHOSPHATE 10 MG/ML IJ SOLN
10.0000 mg | Freq: Once | INTRAMUSCULAR | Status: AC
  Administered 2022-06-27: 10 mg
  Filled 2022-06-27: qty 1

## 2022-06-27 MED ORDER — MIDAZOLAM HCL 2 MG/2ML IJ SOLN
INTRAMUSCULAR | Status: AC
  Filled 2022-06-27: qty 2

## 2022-06-27 NOTE — Progress Notes (Signed)
Safety precautions to be maintained throughout the outpatient stay will include: orient to surroundings, keep bed in low position, maintain call bell within reach at all times, provide assistance with transfer out of bed and ambulation.  

## 2022-06-27 NOTE — Telephone Encounter (Signed)
H&V Care Navigation CSW Progress Note  Clinical Social Worker  received a return call from pt  to f/u on my messages. She shares that she had received them but doesn't like to speak on the phone when her husbands around, she has an appt at the pain clinic this afternoon and then will speak with me after.   Patient is participating in a Managed Medicaid Plan:  No, UHC Medicare only.   SDOH Screenings   Food Insecurity: Food Insecurity Present (04/24/2022)  Housing: Medium Risk (04/24/2022)  Transportation Needs: Unmet Transportation Needs (04/24/2022)  Utilities: At Risk (04/24/2022)  Depression (PHQ2-9): Low Risk  (06/15/2022)  Tobacco Use: Medium Risk (06/15/2022)   Westley Hummer, MSW, [place]  [phone]- work cell phone (preferred) [phone]- desk phone
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
## 2022-06-27 NOTE — Progress Notes (Signed)
PROVIDER NOTE: Interpretation of information contained herein should be left to medically-trained personnel. Specific patient instructions are provided elsewhere under "Patient Instructions" section of medical record. This document was created in part using STT-dictation technology, any transcriptional errors that may result from this process are unintentional.  Patient: Brandy [name] [name] Type: Established DOB: 10-May-1966 MRN: [id number] PCP: Sandrea Hughs, NP  Service: Procedure DOS: 06/27/2022 Setting: Ambulatory Location: Ambulatory outpatient facility Delivery: Face-to-face Provider: Gillis Santa, MD Specialty: Interventional Pain Management Specialty designation: 09 Location: Outpatient facility Ref. Prov.: Ngetich, Dinah C, NP    Procedure:           Type: Lumbar Facet, Medial Branch Radiofrequency Ablation (RFA) #1  Laterality: Left (-LT)  Level: L3, L4, and L5 Medial Branch Level(s).  Imaging: Fluoroscopy-guided         Anesthesia: Local anesthesia (1-2% Lidocaine) Anxiolysis: IV Versed         DOS: 06/27/2022  Performed by: Gillis Santa, MD  Purpose: Therapeutic/Palliative Indications: Low back pain severe enough to impact quality of life or function. Indications: 1. Lumbar facet syndrome (Multilevel) (Bilateral) (R>L)   2. Spondylosis without myelopathy or radiculopathy, lumbosacral region   3. Chronic pain syndrome    Brandy [name] has been dealing with the above chronic pain for longer than three months and has either failed to respond, was unable to tolerate, or simply did not get enough benefit from other more conservative therapies including, but not limited to: 1. Over-the-counter medications 2. Anti-inflammatory medications 3. Muscle relaxants 4. Membrane stabilizers 5. Opioids 6. Physical therapy and/or chiropractic manipulation 7. Modalities (Heat, ice, etc.) 8. Invasive techniques such as nerve blocks. Brandy [name] has  attained more than 50% relief of the pain from a series of diagnostic injections conducted in separate occasions.  Pain Score: Pre-procedure: 4 /10 Post-procedure: 4 /10     Position / Prep / Materials:  Position: Prone  Prep solution: DuraPrep (Iodine Povacrylex [0.7% available iodine] and Isopropyl Alcohol, 74% w/w) Prep Area: Entire Lumbosacral Region (Lower back from mid-thoracic region to end of tailbone and from flank to flank.) Materials:  Tray: RFA (Radiofrequency) tray Needle(s):  Type: RFA (Teflon-coated radiofrequency ablation needles)  Pre-op H&P Assessment:  Brandy [name] is a 57 y.o. (year old), female patient, seen today for interventional treatment. She  has a past surgical history that includes Breast surgery (Bilateral, 2011); Cataract extraction; Bunionectomy; Cleft palate repair; Cardiac surgery; Ablation; transthoracic echocardiogram; Cardiac catheterization; Lumbar laminectomy/decompression microdiscectomy (Left, 05/23/2016); Lumbar laminectomy/decompression microdiscectomy (Left, 05/23/2016); Shoulder injection (Left, 05/23/2016); Reduction mammaplasty (2011); Eye surgery (Right, 2019); Tubal ligation; and Breast lumpectomy with radioactive seed and sentinel lymph node biopsy (Right, 11/23/2020). Brandy [name] has a current medication list which includes the following prescription(s): abemaciclib, acetaminophen, albuterol, albuterol, alprazolam, amitriptyline, belbuca, carboxymethylcellul-glycerin, diphenoxylate-atropine, ezetimibe, wixela inhub, loperamide, methocarbamol, midodrine, multivitamin with minerals, omeprazole, tramadol, triamcinolone cream, vitamin d (ergocalciferol), lubricant eye nighttime, xiidra, and potassium chloride sa, and the following Facility-Administered Medications: lactated ringers. Her primarily concern today is the Back Pain (lower)  Initial Vital Signs:  Pulse/HCG Rate: (!) 110  Temp: 97.9 F (36.6 C) Resp: 14 BP:  105/82 SpO2: 99 %  BMI: Estimated body mass index is 30.54 kg/m as calculated from the following:   Height as of this encounter: '5\' 7"'$  (1.702 m).   Weight as of this encounter: 195 lb (88.5 kg).  Risk Assessment: Allergies: Reviewed. She is allergic to zithromax [azithromycin], tramadol, and psyllium.  Allergy Precautions: None required Coagulopathies:  Reviewed. None identified.  Blood-thinner therapy: None at this time Active Infection(s): Reviewed. None identified. Brandy [name] is afebrile  Site Confirmation: Brandy [name] was asked to confirm the procedure and laterality before marking the site Procedure checklist: Completed Consent: Before the procedure and under the influence of no sedative(s), amnesic(s), or anxiolytics, the patient was informed of the treatment options, risks and possible complications. To fulfill our ethical and legal obligations, as recommended by the American Medical Association's Code of Ethics, I have informed the patient of my clinical impression; the nature and purpose of the treatment or procedure; the risks, benefits, and possible complications of the intervention; the alternatives, including doing nothing; the risk(s) and benefit(s) of the alternative treatment(s) or procedure(s); and the risk(s) and benefit(s) of doing nothing. The patient was provided information about the general risks and possible complications associated with the procedure. These may include, but are not limited to: failure to achieve desired goals, infection, bleeding, organ or nerve damage, allergic reactions, paralysis, and death. In addition, the patient was informed of those risks and complications associated to Spine-related procedures, such as failure to decrease pain; infection (i.e.: Meningitis, epidural or intraspinal abscess); bleeding (i.e.: epidural hematoma, subarachnoid hemorrhage, or any other type of intraspinal or peri-dural bleeding); organ or nerve  damage (i.e.: Any type of peripheral nerve, nerve root, or spinal cord injury) with subsequent damage to sensory, motor, and/or autonomic systems, resulting in permanent pain, numbness, and/or weakness of one or several areas of the body; allergic reactions; (i.e.: anaphylactic reaction); and/or death. Furthermore, the patient was informed of those risks and complications associated with the medications. These include, but are not limited to: allergic reactions (i.e.: anaphylactic or anaphylactoid reaction(s)); adrenal axis suppression; blood sugar elevation that in diabetics may result in ketoacidosis or comma; water retention that in patients with history of congestive heart failure may result in shortness of breath, pulmonary edema, and decompensation with resultant heart failure; weight gain; swelling or edema; medication-induced neural toxicity; particulate matter embolism and blood vessel occlusion with resultant organ, and/or nervous system infarction; and/or aseptic necrosis of one or more joints. Finally, the patient was informed that Medicine is not an exact science; therefore, there is also the possibility of unforeseen or unpredictable risks and/or possible complications that may result in a catastrophic outcome. The patient indicated having understood very clearly. We have given the patient no guarantees and we have made no promises. Enough time was given to the patient to ask questions, all of which were answered to the patient's satisfaction. Brandy [name] has indicated that she wanted to continue with the procedure. Attestation: I, the ordering provider, attest that I have discussed with the patient the benefits, risks, side-effects, alternatives, likelihood of achieving goals, and potential problems during recovery for the procedure that I have provided informed consent. Date  Time: 06/27/2022  9:27 AM  Pre-Procedure Preparation:  Monitoring: As per clinic protocol. Respiration,  ETCO2, SpO2, BP, heart rate and rhythm monitor placed and checked for adequate function Safety Precautions: Patient was assessed for positional comfort and pressure points before starting the procedure. Time-out: I initiated and conducted the "Time-out" before starting the procedure, as per protocol. The patient was asked to participate by confirming the accuracy of the "Time Out" information. Verification of the correct person, site, and procedure were performed and confirmed by me, the nursing staff, and the patient. "Time-out" conducted as per Joint Commission's Universal Protocol (UP.01.01.01). Time: 1015  Description of Procedure:  Laterality: Left Levels:  L3, L4, and L5 Medial Branch Level(s). Safety Precautions: Aspiration looking for blood return was conducted prior to all injections. At no point did we inject any substances, as a needle was being advanced. Before injecting, the patient was told to immediately notify me if she was experiencing any new onset of "ringing in the ears, or metallic taste in the mouth". No attempts were made at seeking any paresthesias. Safe injection practices and needle disposal techniques used. Medications properly checked for expiration dates. SDV (single dose vial) medications used. After the completion of the procedure, all disposable equipment used was discarded in the proper designated medical waste containers. Local Anesthesia: Protocol guidelines were followed. The patient was positioned over the fluoroscopy table. The area was prepped in the usual manner. The time-out was completed. The target area was identified using fluoroscopy. A 12-in long, straight, sterile hemostat was used with fluoroscopic guidance to locate the targets for each level blocked. Once located, the skin was marked with an approved surgical skin marker. Once all sites were marked, the skin (epidermis, dermis, and hypodermis), as well as deeper tissues (fat, connective tissue and  muscle) were infiltrated with a small amount of a short-acting local anesthetic, loaded on a 10cc syringe with a 25G, 1.5-in  Needle. An appropriate amount of time was allowed for local anesthetics to take effect before proceeding to the next step. Technical description of process:  Radiofrequency Ablation (RFA) L3 Medial Branch Nerve RFA: The target area for the L3 medial branch is at the junction of the postero-lateral aspect of the superior articular process and the superior, posterior, and medial edge of the transverse process of L4. Under fluoroscopic guidance, a Radiofrequency needle was inserted until contact was made with os over the superior postero-lateral aspect of the pedicular shadow (target area). Sensory and motor testing was conducted to properly adjust the position of the needle. Once satisfactory placement of the needle was achieved, the numbing solution was slowly injected after negative aspiration for blood. 2.0 mL of the nerve block solution was injected without difficulty or complication. After waiting for at least 3 minutes, the ablation was performed. Once completed, the needle was removed intact. L4 Medial Branch Nerve RFA: The target area for the L4 medial branch is at the junction of the postero-lateral aspect of the superior articular process and the superior, posterior, and medial edge of the transverse process of L5. Under fluoroscopic guidance, a Radiofrequency needle was inserted until contact was made with os over the superior postero-lateral aspect of the pedicular shadow (target area). Sensory and motor testing was conducted to properly adjust the position of the needle. Once satisfactory placement of the needle was achieved, the numbing solution was slowly injected after negative aspiration for blood. 2.0 mL of the nerve block solution was injected without difficulty or complication. After waiting for at least 3 minutes, the ablation was performed. Once completed, the needle  was removed intact. L5 Medial Branch Nerve RFA: The target area for the L5 medial branch is at the junction of the postero-lateral aspect of the superior articular process of S1 and the superior, posterior, and medial edge of the sacral ala. Under fluoroscopic guidance, a Radiofrequency needle was inserted until contact was made with os over the superior postero-lateral aspect of the pedicular shadow (target area). Sensory and motor testing was conducted to properly adjust the position of the needle. Once satisfactory placement of the needle was achieved, the numbing solution was slowly injected after negative aspiration  for blood. 2.0 mL of the nerve block solution was injected without difficulty or complication. After waiting for at least 3 minutes, the ablation was performed. Once completed, the needle was removed intact.  Radiofrequency lesioning (ablation):  Radiofrequency Generator: Medtronic AccurianTM AG 1000 RF Generator Sensory Stimulation Parameters: 50 Hz was used to locate & identify the nerve, making sure that the needle was positioned such that there was no sensory stimulation below 0.3 V or above 0.7 V. Motor Stimulation Parameters: 2 Hz was used to evaluate the motor component. Care was taken not to lesion any nerves that demonstrated motor stimulation of the lower extremities at an output of less than 2.5 times that of the sensory threshold, or a maximum of 2.0 V. Lesioning Technique Parameters: Standard Radiofrequency settings. (Not bipolar or pulsed.) Temperature Settings: 80 degrees C Lesioning time: 60 seconds Intra-operative Compliance: Compliant  6cc solution made of 5cc of 0.2% ropivacaine, 1 cc of Decadron 10 mg/cc.  2 cc injected at each level above on the LEFT  Once the entire procedure was completed, the treated area was cleaned, making sure to leave some of the prepping solution back to take advantage of its long term bactericidal properties.    Illustration of the  posterior view of the lumbar spine and the posterior neural structures. Laminae of L2 through S1 are labeled. DPRL5, dorsal primary ramus of L5; DPRS1, dorsal primary ramus of S1; DPR3, dorsal primary ramus of L3; FJ, facet (zygapophyseal) joint L3-L4; I, inferior articular process of L4; LB1, lateral branch of dorsal primary ramus of L1; IAB, inferior articular branches from L3 medial branch (supplies L4-L5 facet joint); IBP, intermediate branch plexus; MB3, medial branch of dorsal primary ramus of L3; NR3, third lumbar nerve root; S, superior articular process of L5; SAB, superior articular branches from L4 (supplies L4-5 facet joint also); TP3, transverse process of L3.  Vitals:   06/27/22 1022 06/27/22 1027 06/27/22 1032 06/27/22 1033  BP: 117/77 126/70 120/78 128/75  Pulse: (!) 104 (!) 105 (!) 108 (!) 106  Resp: '18 16 18 18  '$ Temp:      TempSrc:      SpO2: 99% 100% 95% 95%  Weight:      Height:        Start Time: 1015 hrs. End Time: 1032 hrs.  Imaging Guidance (Spinal):          Type of Imaging Technique: Fluoroscopy Guidance (Spinal) Indication(s): Assistance in needle guidance and placement for procedures requiring needle placement in or near specific anatomical locations not easily accessible without such assistance. Exposure Time: Please see nurses notes. Contrast: None used. Fluoroscopic Guidance: I was personally present during the use of fluoroscopy. "Tunnel Vision Technique" used to obtain the best possible view of the target area. Parallax error corrected before commencing the procedure. "Direction-depth-direction" technique used to introduce the needle under continuous pulsed fluoroscopy. Once target was reached, antero-posterior, oblique, and lateral fluoroscopic projection used confirm needle placement in all planes. Images permanently stored in EMR. Interpretation: No contrast injected. I personally interpreted the imaging intraoperatively. Adequate needle placement confirmed  in multiple planes. Permanent images saved into the patient's record.  Antibiotic Prophylaxis:   Anti-infectives (From admission, onward)    None      Indication(s): None identified  Post-operative Assessment:  Post-procedure Vital Signs:  Pulse/HCG Rate: (!) 106  Temp: 97.9 F (36.6 C) Resp: 18 BP: 128/75 SpO2: 95 %  EBL: None  Complications: No immediate post-treatment complications observed by team, or reported by  patient.  Note: The patient tolerated the entire procedure well. A repeat set of vitals were taken after the procedure and the patient was kept under observation following institutional policy, for this type of procedure. Post-procedural neurological assessment was performed, showing return to baseline, prior to discharge. The patient was provided with post-procedure discharge instructions, including a section on how to identify potential problems. Should any problems arise concerning this procedure, the patient was given instructions to immediately contact us, at any time, without hesitation. In any case, we plan to contact the patient by telephone for a follow-up status report regarding this interventional procedure.  Comments:  No additional relevant information.  Plan of Care  Orders:  Orders Placed This Encounter  Procedures   DG PAIN CLINIC C-ARM 1-60 MIN NO REPORT    Intraoperative interpretation by procedural physician at [facility].    Standing Status:   Standing    Number of Occurrences:   1    Order Specific Question:   Reason for exam:    Answer:   Assistance in needle guidance and placement for procedures requiring needle placement in or near specific anatomical locations not easily accessible without such assistance.    Medications ordered for procedure: Meds ordered this encounter  Medications   lidocaine (XYLOCAINE) 2 % (with pres) injection 400 mg   lactated ringers infusion   midazolam (VERSED) 5 MG/5ML injection 0.5-2 mg     Make sure Flumazenil is available in the pyxis when using this medication. If oversedation occurs, administer 0.2 mg IV over 15 sec. If after 45 sec no response, administer 0.2 mg again over 1 min; may repeat at 1 min intervals; not to exceed 4 doses (1 mg)   dexamethasone (DECADRON) injection 10 mg   ropivacaine (PF) 2 mg/mL (0.2%) (NAROPIN) injection 9 mL   Medications administered: We administered lidocaine, midazolam, dexamethasone, and ropivacaine (PF) 2 mg/mL (0.2%).  See the medical record for exact dosing, route, and time of administration.  Follow-up plan:   Return in about 6 weeks (around 08/08/2022) for PPE VV.       Interventional management options:    Lumbar RFA; right L3, L4, L5 06/01/2022, left 06/27/22 SCS trial; appropriate windows Sprint PNS medial branch Diagnostic left sacroiliac joint block  Diagnostic right IA hip joint injection       Recent Visits Date Type Provider Dept  06/01/22 Procedure visit Gillis Santa, MD [facility]  05/02/22 Office Visit Gillis Santa, MD [facility]  Showing recent visits within past 90 days and meeting all other requirements Today's Visits Date Type Provider Dept  06/27/22 Procedure visit Gillis Santa, MD [facility]  Showing today's visits and meeting all other requirements Future Appointments Date Type Provider Dept  08/08/22 Appointment Gillis Santa, MD [facility]  Showing future appointments within next 90 days and meeting all other requirements  Disposition: Discharge home  Discharge (Date  Time): 06/27/2022; 1045 hrs.   Primary Care Physician: Sandrea Hughs, NP Location: [name] Note by: Gillis Santa, MD Date: 06/27/2022; Time: 11:01 AM  Disclaimer:  Medicine is not an exact science. The only guarantee in medicine is that nothing is guaranteed. It is important to note that the decision to proceed with this intervention was based on the  information collected from the patient. The Data and conclusions were drawn from the patient's questionnaire, the interview, and the physical examination. Because the information was provided in large part by the patient,  it cannot be guaranteed that it has not been purposely or unconsciously manipulated. Every effort has been made to obtain as much relevant data as possible for this evaluation. It is important to note that the conclusions that lead to this procedure are derived in large part from the available data. Always take into account that the treatment will also be dependent on availability of resources and existing treatment guidelines, considered by other Pain Management Practitioners as being common knowledge and practice, at the time of the intervention. For Medico-Legal purposes, it is also important to point out that variation in procedural techniques and pharmacological choices are the acceptable norm. The indications, contraindications, technique, and results of the above procedure should only be interpreted and judged by a Board-Certified Interventional Pain Specialist with extensive familiarity and expertise in the same exact procedure and technique.

## 2022-06-27 NOTE — Patient Instructions (Signed)

## 2022-06-27 NOTE — Telephone Encounter (Signed)
H&V Care Navigation CSW Progress Note  Clinical Social Worker contacted patient by phone to f/u as pt did not return my call at 1pm as discussed. Left voicemail with my number. I will reattempt again as able.   Patient is participating in a Managed Medicaid Plan:  No, UHC Medicare only.   SDOH Screenings   Food Insecurity: Food Insecurity Present (04/24/2022)  Housing: Medium Risk (04/24/2022)  Transportation Needs: Unmet Transportation Needs (04/24/2022)  Utilities: At Risk (04/24/2022)  Depression (PHQ2-9): Low Risk  (06/15/2022)  Tobacco Use: Medium Risk (06/27/2022)   Westley Hummer, MSW, [place]  [phone]- work cell phone (preferred) [phone]- desk phone

## 2022-06-28 ENCOUNTER — Encounter (HOSPITAL_COMMUNITY): Payer: Medicare Other

## 2022-06-28 ENCOUNTER — Telehealth: Payer: Self-pay | Admitting: *Deleted

## 2022-06-28 ENCOUNTER — Encounter (HOSPITAL_COMMUNITY)
Admission: RE | Admit: 2022-06-28 | Discharge: 2022-06-28 | Disposition: A | Discharge status: Home / Self Care | Payer: Medicare Other | Source: Ambulatory Visit | Attending: Pulmonary Disease | Admitting: Pulmonary Disease

## 2022-06-28 DIAGNOSIS — R0602 Shortness of breath: Secondary | ICD-10-CM

## 2022-06-28 NOTE — Telephone Encounter (Signed)
No problems post procedure. 

## 2022-06-28 NOTE — Telephone Encounter (Signed)
Hi Michiel Cowboy - Thank you so much for including me.  You did an Media planner job with resources for [employer].  Thank you!

## 2022-06-28 NOTE — Progress Notes (Signed)
Daily Session Note  Patient Details  Name: Brandy [name] MRN: [id number] Date of Birth: 1966-04-19 Referring Provider:   April Manson Pulmonary Rehab Walk Test from 06/15/2022 in [facility]  Referring Provider [name]       Encounter Date: 06/28/2022  Check In:  Session Check In - 06/28/22 1128       Check-In   Supervising physician immediately available to respond to emergencies [facility] MD immediately available    Physician(s) Eric Form, NP    Location [facility]    Staff Present Janine Ores, RN, BSN;Randi Olen Cordial BS, ACSM-CEP, Exercise Physiologist;Shalaya Swailes Rosana Hoes, MS, ACSM-CEP, Exercise Physiologist;Other;Samantha [place], RD, LDN    Virtual Visit No    Medication changes reported     No    Fall or balance concerns reported    No    Tobacco Cessation No Change    Warm-up and Cool-down Performed as group-led instruction    Resistance Training Performed Yes    VAD Patient? No    PAD/SET Patient? No      Pain Assessment   Currently in Pain? No/denies             Capillary Blood Glucose: No results found for this or any previous visit (from the past 24 hour(s)).    Social History   Tobacco Use  Smoking Status Former   Packs/day: 0.25   Years: 25.00   Total pack years: 6.25   Types: Cigarettes   Quit date: 02/04/2018   Years since quitting: 4.3  Smokeless Tobacco Never    Goals Met:  Proper associated with RPD/PD & O2 Sat Exercise tolerated well No report of concerns or symptoms today Strength training completed today  Goals Unmet:  Not Applicable  Comments: Service time is from 1000 to 1145.    Dr. Rodman Pickle is Medical Director for Pulmonary Rehab at [facility].

## 2022-06-29 ENCOUNTER — Encounter: Payer: Self-pay | Admitting: Hematology

## 2022-06-30 ENCOUNTER — Encounter (HOSPITAL_COMMUNITY)
Admission: RE | Admit: 2022-06-30 | Discharge: 2022-06-30 | Disposition: A | Discharge status: Home / Self Care | Payer: Medicare Other | Source: Ambulatory Visit | Attending: Pulmonary Disease | Admitting: Pulmonary Disease

## 2022-06-30 ENCOUNTER — Encounter: Payer: Self-pay | Admitting: Hematology

## 2022-06-30 ENCOUNTER — Encounter (HOSPITAL_COMMUNITY): Payer: Medicare Other

## 2022-06-30 DIAGNOSIS — R0602 Shortness of breath: Secondary | ICD-10-CM | POA: Diagnosis not present

## 2022-06-30 NOTE — Progress Notes (Signed)
Daily Session Note  Patient Details  Name: Brandy [name] MRN: [id number] Date of Birth: 02/07/1966 Referring Provider:   April Manson Pulmonary Rehab Walk Test from 06/15/2022 in [facility] for Heart, Vascular, & Lung Health  Referring Provider [name]       Encounter Date: 06/30/2022  Check In:  Session Check In - 06/30/22 0956       Check-In   Supervising physician immediately available to respond to emergencies [facility] MD immediately available    Physician(s) Dr. Silas [place]    Location [facility]    Staff Present Janine Ores, RN, Quentin Ore, MS, ACSM-CEP, Exercise Physiologist;Randi Yevonne Pax, ACSM-CEP, Exercise Physiologist;Other    Virtual Visit No    Medication changes reported     No    Fall or balance concerns reported    No    Tobacco Cessation No Change    Warm-up and Cool-down Performed as group-led instruction    Resistance Training Performed Yes    VAD Patient? No    PAD/SET Patient? No      Pain Assessment   Currently in Pain? No/denies    Multiple Pain Sites No             Capillary Blood Glucose: No results found for this or any previous visit (from the past 24 hour(s)).    Social History   Tobacco Use  Smoking Status Former   Packs/day: 0.25   Years: 25.00   Total pack years: 6.25   Types: Cigarettes   Quit date: 02/04/2018   Years since quitting: 4.4  Smokeless Tobacco Never    Goals Met:  Proper associated with RPD/PD & O2 Sat Exercise tolerated well No report of concerns or symptoms today Strength training completed today  Goals Unmet:  Not Applicable  Comments: Service time is from 1002 to 1150.    Dr. Rodman Pickle is Medical Director for Pulmonary Rehab at [facility].

## 2022-07-02 DIAGNOSIS — R0600 Dyspnea, unspecified: Secondary | ICD-10-CM | POA: Diagnosis not present

## 2022-07-05 ENCOUNTER — Encounter (HOSPITAL_COMMUNITY)
Admission: RE | Admit: 2022-07-05 | Discharge: 2022-07-05 | Disposition: A | Discharge status: Home / Self Care | Payer: Medicare Other | Source: Ambulatory Visit | Attending: Pulmonary Disease | Admitting: Pulmonary Disease

## 2022-07-05 ENCOUNTER — Encounter (HOSPITAL_COMMUNITY): Payer: Medicare Other

## 2022-07-05 VITALS — Wt 199.5 lb

## 2022-07-05 DIAGNOSIS — R0602 Shortness of breath: Secondary | ICD-10-CM

## 2022-07-05 NOTE — Progress Notes (Signed)
Daily Session Note  Patient Details  Name: Brandy [name] MRN: [id number] Date of Birth: 1966-05-15 Referring Provider:   April Manson Pulmonary Rehab Walk Test from 06/15/2022 in [facility] for Heart, Vascular, & [place]  Referring Provider [name]       Encounter Date: 07/05/2022  Check In:  Session Check In - 07/05/22 1008       Check-In   Supervising physician immediately available to respond to emergencies [facility] MD immediately available    Physician(s) Mannam    Location [facility]    Staff Present Janine Ores, RN, Quentin Ore, MS, ACSM-CEP, Exercise Physiologist;Shirline Kendle Olen Cordial BS, ACSM-CEP, Exercise Physiologist;Other    Virtual Visit No    Medication changes reported     No    Fall or balance concerns reported    No    Tobacco Cessation No Change    Warm-up and Cool-down Performed as group-led instruction    Resistance Training Performed Yes    VAD Patient? No    PAD/SET Patient? No      Pain Assessment   Currently in Pain? Yes    Pain Score 7     Pain Location Back    Pain Orientation Lower    Pain Descriptors / Indicators Aching    Pain Onset More than a month ago    Pain Frequency Constant    Pain Relieving Factors takes medication    Multiple Pain Sites No             Capillary Blood Glucose: No results found for this or any previous visit (from the past 24 hour(s)).   Exercise Prescription Changes - 07/05/22 1200       Response to Exercise   Blood Pressure (Admit) 98/52    Blood Pressure (Exercise) 112/60    Blood Pressure (Exit) 112/58    Heart Rate (Admit) 89 bpm    Heart Rate (Exercise) 114 bpm    Heart Rate (Exit) 116 bpm    Oxygen Saturation (Admit) 100 %    Oxygen Saturation (Exercise) 98 %    Oxygen Saturation (Exit) 97 %    Rating of Perceived Exertion (Exercise) 13    Perceived Dyspnea (Exercise) 1    Duration Progress to 30 minutes of  aerobic without signs/symptoms of  physical distress    Intensity THRR unchanged      Progression   Progression Continue to progress workloads to maintain intensity without signs/symptoms of physical distress.      Resistance Training   Training Prescription Yes    Weight red bands    Reps 10-15    Time 10 Minutes      NuStep   Level 3    SPM 60    Minutes 15    METs 1.9      Track   Minutes 15    METs 2.54             Social History   Tobacco Use  Smoking Status Former   Packs/day: 0.25   Years: 25.00   Total pack years: 6.25   Types: Cigarettes   Quit date: 02/04/2018   Years since quitting: 4.4  Smokeless Tobacco Never    Goals Met:  Independence with exercise equipment Exercise tolerated well No report of concerns or symptoms today Strength training completed today  Goals Unmet:  Not Applicable  Comments: Service time is from 1014 to 1136.    Dr. Rodman Pickle is Medical Director  for Pulmonary Rehab at [facility].

## 2022-07-06 ENCOUNTER — Telehealth: Payer: Self-pay | Admitting: Licensed Clinical Social Worker

## 2022-07-06 NOTE — Telephone Encounter (Signed)
H&V Care Navigation CSW Progress Note  Clinical Social Worker contacted patient by phone to f/u on resources sent. Pt confirmed that she received them from pulm rehab. No additional questions/concerns at this time. Encouraged pt to f/u with me as needed. I remain available.    Patient is participating in a Managed Medicaid Plan:  No, [facility] Medicare   [place]: Food Insecurity Present (06/27/2022)  Housing: Medium Risk (06/27/2022)  Transportation Needs: No Transportation Needs (06/27/2022)  Recent Concern: Transportation Needs - Unmet Transportation Needs (04/24/2022)  Utilities: At Risk (06/27/2022)  Depression (PHQ2-9): Low Risk  (06/15/2022)  Financial Resource Strain: High Risk (06/27/2022)  Tobacco Use: Medium Risk (06/27/2022)    Westley Hummer, MSW, [place]  [phone]- work cell phone (preferred) [phone]- desk phone

## 2022-07-07 ENCOUNTER — Encounter (HOSPITAL_COMMUNITY): Payer: Medicare Other

## 2022-07-07 ENCOUNTER — Encounter (HOSPITAL_COMMUNITY)
Admission: RE | Admit: 2022-07-07 | Discharge: 2022-07-07 | Disposition: A | Discharge status: Home / Self Care | Payer: Medicare Other | Source: Ambulatory Visit | Attending: Pulmonary Disease | Admitting: Pulmonary Disease

## 2022-07-07 DIAGNOSIS — R0602 Shortness of breath: Secondary | ICD-10-CM | POA: Insufficient documentation

## 2022-07-07 NOTE — Progress Notes (Signed)
Daily Session Note  Patient Details  Name: Brandy [name] MRN: [id number] Date of Birth: 1965/11/05 Referring Provider:   April Manson Pulmonary Rehab Walk Test from 06/15/2022 in [facility] for Heart, Vascular, & Lung Health  Referring Provider [name]       Encounter Date: 07/07/2022  Check In:  Session Check In - 07/07/22 1018       Check-In   Supervising physician immediately available to respond to emergencies [facility] MD immediately available    Physician(s) Mannam    [facility]    Staff Present Janine Ores, RN, Quentin Ore, MS, ACSM-CEP, Exercise Physiologist;Randi Olen Cordial BS, ACSM-CEP, Exercise Physiologist;Other    Virtual Visit No    Medication changes reported     No    Fall or balance concerns reported    No    Tobacco Cessation No Change    Warm-up and Cool-down Performed as group-led instruction    Resistance Training Performed Yes    VAD Patient? No    PAD/SET Patient? No      Pain Assessment   Currently in Pain? No/denies    Pain Score 0-No pain    Multiple Pain Sites No             Capillary Blood Glucose: No results found for this or any previous visit (from the past 24 hour(s)).    Social History   Tobacco Use  Smoking Status Former   Packs/day: 0.25   Years: 25.00   Total pack years: 6.25   Types: Cigarettes   Quit date: 02/04/2018   Years since quitting: 4.4  Smokeless Tobacco Never    Goals Met:  Proper associated with RPD/PD & O2 Sat Independence with exercise equipment Exercise tolerated well No report of concerns or symptoms today Strength training completed today  Goals Unmet:  Not Applicable  Comments: Service time is from 1011 to 1115.    Dr. Rodman Pickle is Medical Director for Pulmonary Rehab at [facility].

## 2022-07-12 ENCOUNTER — Encounter (HOSPITAL_COMMUNITY): Payer: Medicare Other

## 2022-07-12 ENCOUNTER — Encounter (HOSPITAL_COMMUNITY)
Admission: RE | Admit: 2022-07-12 | Discharge: 2022-07-12 | Disposition: A | Discharge status: Home / Self Care | Payer: Medicare Other | Source: Ambulatory Visit | Attending: Pulmonary Disease | Admitting: Pulmonary Disease

## 2022-07-12 DIAGNOSIS — R0602 Shortness of breath: Secondary | ICD-10-CM

## 2022-07-12 NOTE — Progress Notes (Signed)
Daily Session Note  Patient Details  Name: Brandy [name] MRN: [id number] Date of Birth: Sep 10, 1965 Referring Provider:   April Manson Pulmonary Rehab Walk Test from 06/15/2022 in [facility] for Heart, Vascular, & Lung Health  Referring Provider [name]       Encounter Date: 07/12/2022  Check In:  Session Check In - 07/12/22 1208       Check-In   Supervising physician immediately available to respond to emergencies [facility] MD immediately available    Physician(s) Clung    [facility]    Staff Present Janine Ores, RN, Quentin Ore, MS, ACSM-CEP, Exercise Physiologist;Randi Olen Cordial BS, ACSM-CEP, Exercise Physiologist;Other    Virtual Visit No    Medication changes reported     No    Fall or balance concerns reported    No    Tobacco Cessation No Change    Warm-up and Cool-down Performed as group-led instruction    Resistance Training Performed Yes    VAD Patient? No    PAD/SET Patient? No      Pain Assessment   Currently in Pain? No/denies    Pain Score 0-No pain    Multiple Pain Sites No             Capillary Blood Glucose: No results found for this or any previous visit (from the past 24 hour(s)).    Social History   Tobacco Use  Smoking Status Former   Packs/day: 0.25   Years: 25.00   Total pack years: 6.25   Types: Cigarettes   Quit date: 02/04/2018   Years since quitting: 4.4  Smokeless Tobacco Never    Goals Met:  Proper associated with RPD/PD & O2 Sat Exercise tolerated well No report of concerns or symptoms today Strength training completed today  Goals Unmet:  Not Applicable  Comments: Service time is from 1010 to 1147.    Dr. Rodman Pickle is Medical Director for Pulmonary Rehab at [facility].

## 2022-07-13 DIAGNOSIS — G4733 Obstructive sleep apnea (adult) (pediatric): Secondary | ICD-10-CM | POA: Diagnosis not present

## 2022-07-14 ENCOUNTER — Encounter (HOSPITAL_COMMUNITY)
Admission: RE | Admit: 2022-07-14 | Discharge: 2022-07-14 | Disposition: A | Discharge status: Home / Self Care | Payer: Medicare Other | Source: Ambulatory Visit | Attending: Pulmonary Disease | Admitting: Pulmonary Disease

## 2022-07-14 ENCOUNTER — Encounter: Payer: Self-pay | Admitting: Hematology

## 2022-07-14 ENCOUNTER — Encounter (HOSPITAL_COMMUNITY): Payer: Medicare Other

## 2022-07-14 VITALS — Wt 200.6 lb

## 2022-07-14 DIAGNOSIS — R0602 Shortness of breath: Secondary | ICD-10-CM

## 2022-07-14 NOTE — Progress Notes (Signed)
Daily Session Note  Patient Details  Name: Brandy [name] MRN: [id number] Date of Birth: [birth year] Referring Provider:   April Manson Pulmonary Rehab Walk Test from 06/15/2022 in [facility] for Heart, Vascular, & Lung Health  Referring Provider [name]       Encounter Date: 07/14/2022  Check In:  Session Check In - 07/14/22 1151       Check-In   Supervising physician immediately available to respond to emergencies [facility] MD immediately available    Physician(s) Mannam    [facility]    Staff Present Janine Ores, RN, Quentin Ore, MS, ACSM-CEP, Exercise Physiologist;Other    Virtual Visit No    Medication changes reported     No    Fall or balance concerns reported    No    Tobacco Cessation No Change    Warm-up and Cool-down Performed as group-led instruction    Resistance Training Performed Yes    VAD Patient? No    PAD/SET Patient? No      Pain Assessment   Currently in Pain? No/denies    Pain Score 0-No pain    Multiple Pain Sites No             Capillary Blood Glucose: No results found for this or any previous visit (from the past 24 hour(s)).    Social History   Tobacco Use  Smoking Status Former   Packs/day: 0.25   Years: 25.00   Total pack years: 6.25   Types: Cigarettes   Quit date: 02/04/2018   Years since quitting: 4.4  Smokeless Tobacco Never    Goals Met:  Proper associated with RPD/PD & O2 Sat Independence with exercise equipment Exercise tolerated well No report of concerns or symptoms today Strength training completed today  Goals Unmet:  Not Applicable  Comments: Service time is from 0958 to 1140.    Dr. Rodman Pickle is Medical Director for Pulmonary Rehab at [facility].

## 2022-07-15 ENCOUNTER — Ambulatory Visit (INDEPENDENT_AMBULATORY_CARE_PROVIDER_SITE_OTHER): Payer: Medicare Other | Admitting: Adult Health

## 2022-07-15 ENCOUNTER — Encounter: Payer: Self-pay | Admitting: Adult Health

## 2022-07-15 ENCOUNTER — Other Ambulatory Visit: Payer: Self-pay

## 2022-07-15 VITALS — BP 116/70 | HR 90 | Temp 97.4°F

## 2022-07-15 DIAGNOSIS — U071 COVID-19: Secondary | ICD-10-CM | POA: Diagnosis not present

## 2022-07-15 DIAGNOSIS — R0989 Other specified symptoms and signs involving the circulatory and respiratory systems: Secondary | ICD-10-CM

## 2022-07-15 LAB — POC COVID19 BINAXNOW: SARS Coronavirus 2 Ag: POSITIVE — AB

## 2022-07-15 LAB — POCT INFLUENZA A/B
Influenza A, POC: NEGATIVE
Influenza B, POC: NEGATIVE

## 2022-07-15 MED ORDER — MOLNUPIRAVIR EUA 200MG CAPSULE: 4.0000 | ORAL_CAPSULE | Freq: Two times a day (BID) | ORAL | 0 refills | Status: AC

## 2022-07-15 MED ORDER — ZINC GLUCONATE 50 MG PO TABS: 50.0000 mg | ORAL_TABLET | Freq: Every day | ORAL | 0 refills | Status: AC

## 2022-07-15 MED ORDER — VITAMIN C 1000 MG PO TABS: 1000.0000 mg | ORAL_TABLET | Freq: Every day | ORAL | 0 refills | Status: DC

## 2022-07-15 MED ORDER — MOLNUPIRAVIR EUA 200MG CAPSULE: 4.0000 | ORAL_CAPSULE | Freq: Two times a day (BID) | ORAL | 0 refills | Status: DC

## 2022-07-15 MED ORDER — VITAMIN C 1000 MG PO TABS: 1000.0000 mg | ORAL_TABLET | Freq: Every day | ORAL | 0 refills | Status: AC

## 2022-07-15 NOTE — Patient Instructions (Signed)

## 2022-07-15 NOTE — Progress Notes (Signed)
[facility] clinic  Provider: Durenda Age DNP  Code Status:  Full Code  Goals of Care:     06/27/2022    9:42 AM  Advanced Directives  Would patient like information on creating a medical advance directive? Yes (MAU/Ambulatory/Procedural Areas - Information given)     Chief Complaint  Patient presents with   Acute Visit    Patient presents today for runny nose, diarrhea, congestion and chills since Wednesday,07/13/22. She reports husband tested positive.    HPI: Patient is a 57 y.o. female seen today for an acute visit for runny nose, diarrhea, congestion and chills. Her husband tested positive for COVID-19. She reported having clear nasal discharge. She has nasal congestion and has nasal sounding voice. She had diarrhea last night. She denies fever nor chills. She has 4 COVID-19 vaccines.  Past Medical History:  Diagnosis Date   Acute pansinusitis 08/02/2017   Anxiety    Arthritis    ASD (atrial septal defect)    s/p closure with Amplatzer device 10/05/04 (Dr. Myriam Jacobson, [facility]) 10/05/04   Cancer [facility])    Cataract    Chronic pain    CKD (chronic kidney disease)    Dyspnea    Fatty liver 09/04/2019   GERD (gastroesophageal reflux disease)    Heart murmur    no longer heard   History of hiatal hernia    Hyperlipidemia    Legally blind in right eye, as defined in [place]    Lumbar herniated disc    Migraines    On home oxygen therapy 06/15/2022   Pt was given O2 on D/C from [facility] 04/2022   OSA on CPAP 06/15/2022   PONV (postoperative nausea and vomiting)    Sciatica     Past Surgical History:  Procedure Laterality Date   ABLATION     BREAST LUMPECTOMY WITH RADIOACTIVE SEED AND SENTINEL LYMPH NODE BIOPSY Right 11/23/2020   Procedure: RIGHT BREAST LUMPECTOMY WITH RADIOACTIVE SEED AND RIGHT AXILLARY SENTINEL LYMPH NODE BIOPSY;  Surgeon: Rolm Bookbinder, MD;  Location: [place];  Service: General;  Laterality: Right;   BREAST SURGERY Bilateral 2011   Breast Reduction  Surgery   BUNIONECTOMY     CARDIAC CATHETERIZATION     10/05/04 [facility]): LM < 25%, otherwise normal coronaries. No pulmonary HTN, Mildly enlarged RV. Secundum ASD s/p closure.   CARDIAC SURGERY     CATARACT EXTRACTION     CLEFT PALATE REPAIR     s/p cleft lip and palate repair   EYE SURGERY Right 2019   right eye removed   LUMBAR LAMINECTOMY/DECOMPRESSION MICRODISCECTOMY Left 05/23/2016   Procedure: LEFT L4-L5 LATERAL RECESS DECOMPRESSION WITH CENTRAL AND RIGHT DECOMPRESSION VIA LEFT SIDE;  Surgeon: [name], MD;  Location: [place];  Service: Orthopedics;  Laterality: Left;   LUMBAR LAMINECTOMY/DECOMPRESSION MICRODISCECTOMY Left 05/23/2016   Procedure: LUMBAR LAMINECTOMY/DECOMPRESSION MICRODISCECTOMY Lumbar five - Sacral One 1 LEVEL;  Surgeon: [name], MD;  Location: [place];  Service: Orthopedics;  Laterality: Left;   REDUCTION MAMMAPLASTY  2011   SHOULDER INJECTION Left 05/23/2016   Procedure: SHOULDER INJECTION;  Surgeon: [name], MD;  Location: [place];  Service: Orthopedics;  Laterality: Left;  band-aid per pa-c   TRANSTHORACIC ECHOCARDIOGRAM     12/15/05 [facility]): Mild LVH, EF > [id number], grade 1 diastolic dysfunction, Trivial MR/PR/TR.   TUBAL LIGATION      Allergies  Allergen Reactions   Zithromax [Azithromycin] Shortness Of Breath and Itching    TOTAL BODY ITCHING [  EVEN SOLES OF FEET] WHEEZING    Tramadol Itching and Other (See Comments)    Has taken recently without any side effects.   Psyllium Nausea And Vomiting and Other (See Comments)    Metamucil. Sneezing      Outpatient Encounter Medications as of 07/15/2022  Medication Sig   abemaciclib (VERZENIO) 100 MG tablet Take 1 tablet (100 mg total) by mouth 2 (two) times daily.   acetaminophen (TYLENOL) 500 MG tablet Take 500 mg by mouth daily as needed for moderate pain.   albuterol (PROVENTIL) (2.5 MG/3ML) 0.083% nebulizer solution Take 3 mLs (2.5 mg total) by nebulization every 6 (six) hours as needed for wheezing or  shortness of breath.   albuterol (VENTOLIN HFA) 108 (90 Base) MCG/ACT inhaler Inhale 2 puffs into the lungs every 6 (six) hours as needed for wheezing or shortness of breath.   ALPRAZolam (XANAX) 1 MG tablet Take 1 tablet (1 mg total) by mouth at bedtime.   amitriptyline (ELAVIL) 75 MG tablet TAKE 1 TABLET BY MOUTH AT BEDTIME   Carboxymethylcellul-Glycerin (LUBRICATING EYE DROPS OP) Place 1 drop into the right eye in the morning, at noon, and at bedtime.   diphenoxylate-atropine (LOMOTIL) 2.5-0.025 MG tablet Take 1 tablet by mouth 3 (three) times daily as needed for diarrhea or loose stools.   ezetimibe (ZETIA) 10 MG tablet Take 1 tablet (10 mg total) by mouth daily.   fluticasone-salmeterol (ADVAIR) 250-50 MCG/ACT AEPB INHALE 1 INHALATION BY MOUTH IN  THE MORNING AND AT BEDTIME   loperamide (IMODIUM) 2 MG capsule Take 1-2 capsules (2-4 mg total) by mouth 4 (four) times daily as needed for diarrhea or loose stools. (Patient taking differently: Take 4 mg by mouth as needed for diarrhea or loose stools.)   methocarbamol (ROBAXIN) 500 MG tablet Take 1 tablet (500 mg total) by mouth every 6 (six) hours as needed for muscle spasms. (Patient taking differently: Take 500 mg by mouth 3 (three) times daily as needed for muscle spasms.)   midodrine (PROAMATINE) 5 MG tablet Take 1 tablet (5 mg total) by mouth 3 (three) times daily with meals.   Multiple Vitamin (MULTIVITAMIN WITH MINERALS) TABS tablet Take 1 tablet by mouth daily.   omeprazole (PRILOSEC OTC) 20 MG tablet Take 20 mg by mouth daily.   potassium chloride SA (KLOR-CON M) 20 MEQ tablet Take 1 tablet (20 mEq total) by mouth 2 (two) times daily. For 14 days   traMADol (ULTRAM-ER) 100 MG 24 hr tablet Take 1 tablet (100 mg total) by mouth daily.   triamcinolone cream (KENALOG) 0.1 % Apply 1 Application topically 2 (two) times daily.   Vitamin D, Ergocalciferol, (DRISDOL) 1.25 MG (50000 UNIT) CAPS capsule TAKE 1 CAPSULE ONCE PER WEEK   White  Petrolatum-Mineral Oil (LUBRICANT EYE NIGHTTIME) OINT Place 1 drop into the left eye at bedtime as needed (dryness).   XIIDRA 5 % SOLN Place 1 drop into the left eye in the morning, at noon, and at bedtime.   No facility-administered encounter medications on file as of 07/15/2022.    Review of Systems:  Review of Systems  Constitutional:  Negative for appetite change, chills, fatigue and fever.  HENT:  Positive for congestion and rhinorrhea. Negative for hearing loss and sore throat.   Eyes: Negative.   Respiratory:  Negative for cough, shortness of breath and wheezing.   Cardiovascular:  Negative for chest pain, palpitations and leg swelling.  Gastrointestinal:  Positive for diarrhea. Negative for abdominal pain, constipation, nausea and vomiting.  Genitourinary:  Negative for dysuria.  Musculoskeletal:  Negative for arthralgias, back pain and myalgias.  Skin:  Negative for color change, rash and wound.  Neurological:  Negative for dizziness, weakness and headaches.  Psychiatric/Behavioral:  Negative for behavioral problems. The patient is not nervous/anxious.     Health Maintenance  Topic Date Due   Medicare Annual Wellness (AWV)  Never done   DTaP/Tdap/Td (1 - Tdap) Never done   PAP SMEAR-Modifier  Never done   COVID-19 Vaccine (5 - 2023-24 season) 02/04/2022   MAMMOGRAM  01/05/2024   COLONOSCOPY (Pts 45-78yr Insurance coverage will need to be confirmed)  08/16/2027   INFLUENZA VACCINE  Completed   Hepatitis C Screening  Completed   HIV Screening  Completed   Zoster Vaccines- Shingrix  Completed   HPV VACCINES  Aged Out    Physical Exam: Vitals:   07/15/22 1136  BP: 116/70  Pulse: 90  Temp: (!) 97.4 F (36.3 C)  SpO2: 96%   There is no height or weight on file to calculate BMI. Physical Exam Constitutional:      Appearance: Normal appearance.  HENT:     Head: Normocephalic and atraumatic.     Nose: Nose normal.     Mouth/Throat:     Mouth: Mucous membranes are  moist.  Eyes:     Conjunctiva/sclera: Conjunctivae normal.  Cardiovascular:     Rate and Rhythm: Normal rate and regular rhythm.  Pulmonary:     Effort: Pulmonary effort is normal.     Breath sounds: Normal breath sounds.  Abdominal:     General: Bowel sounds are normal.     Palpations: Abdomen is soft.  Musculoskeletal:        General: Normal range of motion.     Cervical back: Normal range of motion.  Skin:    General: Skin is warm and dry.  Neurological:     General: No focal deficit present.     Mental Status: She is alert and oriented to person, place, and time.  Psychiatric:        Mood and Affect: Mood normal.        Behavior: Behavior normal.        Thought Content: Thought content normal.        Judgment: Judgment normal.    Labs reviewed: Basic Metabolic Panel: Recent Labs    04/24/22 1545 04/25/22 0844 04/26/22 0518 05/06/22 0958 05/24/22 0827 06/07/22 0851 06/24/22 1346  NA 140 143 140 137 143 140 141  K 2.0* 3.1* 3.1* 4.2 2.6* 3.9 3.8  CL 91* 97* 97* 94* 102 103 101  CO2 37* 37* 35* 29 31 27 $ 32  GLUCOSE 118* 100* 104* 144* 113* 93 96  BUN 10 8 8 21 12 13 12  $ CREATININE 1.54* 1.27* 1.12* 1.84* 1.66* 1.38* 1.91*  CALCIUM 8.0* 7.7* 7.9* 10.4* 8.9 9.2 9.7  MG 1.3* 1.5* 1.8 1.7  --   --   --   PHOS 1.9* 1.7* 2.6  --   --   --   --   TSH  --   --   --   --   --  1.62  --    Liver Function Tests: Recent Labs    05/06/22 0958 05/24/22 0827 06/07/22 0851 06/24/22 1346  AST 51* 59* 42* 41  ALT 47* 46* 35* 36  ALKPHOS 275* 211*  --  190*  BILITOT 0.8 0.8 0.9 1.0  PROT 7.6 7.3 7.1 7.5  ALBUMIN 4.0 3.1*  --  3.8   No results for input(s): "LIPASE", "AMYLASE" in the last 8760 hours. No results for input(s): "AMMONIA" in the last 8760 hours. CBC: Recent Labs    05/24/22 0827 06/07/22 0851 06/24/22 1346  WBC 4.9 3.9 6.1  NEUTROABS 1.6* 1,279* 2.4  HGB 10.6* 10.9* 12.0  HCT 30.0* 31.8* 35.4*  MCV 103.4* 104.3* 107.3*  PLT 186 262 227   Lipid  Panel: Recent Labs    10/26/21 1101 06/07/22 0851  CHOL 166 219*  HDL 74 76  LDLCALC 67 112*  TRIG 172* 194*  CHOLHDL 2.2 2.9   No results found for: "HGBA1C"  Procedures since last visit: [place] C-ARM 1-60 MIN NO REPORT  Result Date: 06/27/2022 Fluoro was used, but no Radiologist interpretation will be provided. Please refer to "NOTES" tab for provider progress note.   Assessment/Plan  1. COVID-19 virus infection -  will start on Molnupiravir 800 mg BID X 5 days -  Vitamin C 1,000 mg 1 tab daily -  Zinc gluconate 50 mg 1 tab daily -  Quarantine X 10 days  2. Runny nose - POC COVID-19 - POCT Influenza A/B  3. Symptoms of upper respiratory infection (URI) - POC COVID-19 - POCT Influenza A/B   Labs/tests ordered:  POC COVID-19 test  Next appt:  09/13/2022

## 2022-07-19 ENCOUNTER — Encounter (HOSPITAL_COMMUNITY): Payer: Medicare Other

## 2022-07-20 NOTE — Progress Notes (Signed)
Pulmonary Individual Treatment Plan  Patient Details  Name: Brandy [name] MRN: [id number] Date of Birth: [birth year] Referring Provider:   April Manson Pulmonary Rehab Walk Test from 06/15/2022 in [facility] for Heart, Vascular, & [place]  Referring Provider [name]       Initial Encounter Date:  Flowsheet Row Pulmonary Rehab Walk Test from 06/15/2022 in [facility] for Heart, Vascular, & [place]  Date 06/15/22       Visit Diagnosis: Shortness of breath  Patient's Home Medications on Admission:   Current Outpatient Medications:    abemaciclib (VERZENIO) 100 MG tablet, Take 1 tablet (100 mg total) by mouth 2 (two) times daily., Disp: 56 tablet, Rfl: 3   acetaminophen (TYLENOL) 500 MG tablet, Take 500 mg by mouth daily as needed for moderate pain., Disp: , Rfl:    albuterol (PROVENTIL) (2.5 MG/3ML) 0.083% nebulizer solution, Take 3 mLs (2.5 mg total) by nebulization every 6 (six) hours as needed for wheezing or shortness of breath., Disp: 180 mL, Rfl: 1   albuterol (VENTOLIN HFA) 108 (90 Base) MCG/ACT inhaler, Inhale 2 puffs into the lungs every 6 (six) hours as needed for wheezing or shortness of breath., Disp: 8 g, Rfl: 6   ALPRAZolam (XANAX) 1 MG tablet, Take 1 tablet (1 mg total) by mouth at bedtime., Disp: 30 tablet, Rfl: 0   amitriptyline (ELAVIL) 75 MG tablet, TAKE 1 TABLET BY MOUTH AT BEDTIME, Disp: 90 tablet, Rfl: 1   Ascorbic Acid (VITAMIN C) 1000 MG tablet, Take 1 tablet (1,000 mg total) by mouth daily for 7 days., Disp: 7 tablet, Rfl: 0   Carboxymethylcellul-Glycerin (LUBRICATING EYE DROPS OP), Place 1 drop into the right eye in the morning, at noon, and at bedtime., Disp: , Rfl:    diphenoxylate-atropine (LOMOTIL) 2.5-0.025 MG tablet, Take 1 tablet by mouth 3 (three) times daily as needed for diarrhea or loose stools., Disp: 30 tablet, Rfl: 0   ezetimibe (ZETIA) 10 MG tablet, Take 1 tablet (10 mg total)  by mouth daily., Disp: 90 tablet, Rfl: 1   fluticasone-salmeterol (ADVAIR) 250-50 MCG/ACT AEPB, INHALE 1 INHALATION BY MOUTH IN  THE MORNING AND AT BEDTIME, Disp: 120 each, Rfl: 5   loperamide (IMODIUM) 2 MG capsule, Take 1-2 capsules (2-4 mg total) by mouth 4 (four) times daily as needed for diarrhea or loose stools. (Patient taking differently: Take 4 mg by mouth as needed for diarrhea or loose stools.), Disp: 30 capsule, Rfl: 1   methocarbamol (ROBAXIN) 500 MG tablet, Take 1 tablet (500 mg total) by mouth every 6 (six) hours as needed for muscle spasms. (Patient taking differently: Take 500 mg by mouth 3 (three) times daily as needed for muscle spasms.), Disp: 40 tablet, Rfl: 2   midodrine (PROAMATINE) 5 MG tablet, Take 1 tablet (5 mg total) by mouth 3 (three) times daily with meals., Disp: 90 tablet, Rfl: 3   molnupiravir EUA (LAGEVRIO) 200 mg CAPS capsule, Take 4 capsules (800 mg total) by mouth 2 (two) times daily for 5 days., Disp: 40 capsule, Rfl: 0   Multiple Vitamin (MULTIVITAMIN WITH MINERALS) TABS tablet, Take 1 tablet by mouth daily., Disp: , Rfl:    omeprazole (PRILOSEC OTC) 20 MG tablet, Take 20 mg by mouth daily., Disp: , Rfl:    potassium chloride SA (KLOR-CON M) 20 MEQ tablet, Take 1 tablet (20 mEq total) by mouth 2 (two) times daily. For 14 days, Disp: 28 tablet, Rfl: 0   traMADol (  ULTRAM-ER) 100 MG 24 hr tablet, Take 1 tablet (100 mg total) by mouth daily., Disp: 30 tablet, Rfl: 1   triamcinolone cream (KENALOG) 0.1 %, Apply 1 Application topically 2 (two) times daily., Disp: 30 g, Rfl: 0   Vitamin D, Ergocalciferol, (DRISDOL) 1.25 MG (50000 UNIT) CAPS capsule, TAKE 1 CAPSULE ONCE PER WEEK, Disp: 12 capsule, Rfl: 3   White Petrolatum-Mineral Oil (LUBRICANT EYE NIGHTTIME) OINT, Place 1 drop into the left eye at bedtime as needed (dryness)., Disp: , Rfl:    XIIDRA 5 % SOLN, Place 1 drop into the left eye in the morning, at noon, and at bedtime., Disp: , Rfl:    zinc gluconate 50 MG  tablet, Take 1 tablet (50 mg total) by mouth daily for 7 days., Disp: 7 tablet, Rfl: 0  Past Medical History: Past Medical History:  Diagnosis Date   Acute pansinusitis 08/02/2017   Anxiety    Arthritis    ASD (atrial septal defect)    s/p closure with Amplatzer device 10/05/04 (Dr. Myriam Jacobson, [facility]) 10/05/04   Cancer [facility])    Cataract    Chronic pain    CKD (chronic kidney disease)    Dyspnea    Fatty liver 09/04/2019   GERD (gastroesophageal reflux disease)    Heart murmur    no longer heard   History of hiatal hernia    Hyperlipidemia    Legally blind in right eye, as defined in [place]    Lumbar herniated disc    Migraines    On home oxygen therapy 06/15/2022   Pt was given O2 on D/C from [facility] 04/2022   OSA on CPAP 06/15/2022   PONV (postoperative nausea and vomiting)    Sciatica     Tobacco Use: Social History   Tobacco Use  Smoking Status Former   Packs/day: 0.25   Years: 25.00   Total pack years: 6.25   Types: Cigarettes   Quit date: 02/04/2018   Years since quitting: 4.4  Smokeless Tobacco Never    Labs: Review Flowsheet       Latest Ref Rng & Units 08/04/2019 05/28/2021 10/26/2021 06/07/2022  Labs for ITP Cardiac and Pulmonary Rehab  Cholestrol <200 mg/dL - 291  166  219   LDL (calc) mg/dL (calc) - 172  67  112   HDL-C > OR = 50 mg/dL - 87  74  76   Trlycerides <150 mg/dL 301  167  172  194     Capillary Blood Glucose: Lab Results  Component Value Date   GLUCAP 106 (H) 05/13/2022   GLUCAP 91 08/07/2020   GLUCAP 107 (H) 02/22/2015     Pulmonary Assessment Scores:  Pulmonary Assessment Scores     Row Name 06/15/22 0951         ADL UCSD   SOB Score total 59       CAT Score   CAT Score 10       mMRC Score   mMRC Score 4             UCSD: Self-administered rating of dyspnea associated with activities of daily living (ADLs) 6-point scale (0 = "not at all" to 5 = "maximal or unable to do because of breathlessness")  Scoring Scores  range from 0 to 120.  Minimally important difference is 5 units  CAT: CAT can identify the health impairment of COPD patients and is better correlated with disease progression.  CAT has a scoring range of zero to 40.  The CAT score is classified into four groups of low (less than 10), medium (10 - 20), high (21-30) and very high (31-40) based on the impact level of disease on health status. A CAT score over 10 suggests significant symptoms.  A worsening CAT score could be explained by an exacerbation, poor medication adherence, poor inhaler technique, or progression of COPD or comorbid conditions.  CAT MCID is 2 points  mMRC: mMRC (Modified Medical Research Council) Dyspnea Scale is used to assess the degree of baseline functional disability in patients of respiratory disease due to dyspnea. No minimal important difference is established. A decrease in score of 1 point or greater is considered a positive change.   Pulmonary Function Assessment:  Pulmonary Function Assessment - 06/15/22 1140       Breath   Bilateral Breath Sounds Clear    Shortness of Breath No             Exercise Target Goals: Exercise Program Goal: Individual exercise prescription set using results from initial 6 min walk test and THRR while considering  patient's activity barriers and safety.   Exercise Prescription Goal: Initial exercise prescription builds to 30-45 minutes a day of aerobic activity, 2-3 days per week.  Home exercise guidelines will be given to patient during program as part of exercise prescription that the participant will acknowledge.  Activity Barriers & Risk Stratification:  Activity Barriers & Cardiac Risk Stratification - 06/15/22 1016       Activity Barriers & Cardiac Risk Stratification   Activity Barriers Deconditioning;Shortness of Breath;Muscular Weakness;Back Problems;Other (comment);Assistive Device    Comments Bilateral hip pain, sciatica             6 Minute Walk:  6  Minute Walk     Row Name 06/15/22 1137         6 Minute Walk   Phase Initial     Distance 968 feet     Walk Time 6 minutes     # of Rest Breaks 0     MPH 1.83     METS 3.6     RPE 12     Perceived Dyspnea  3     VO2 Peak 12.61     Symptoms No     Resting HR 92 bpm     Resting BP 118/66     Resting Oxygen Saturation  100 %     Exercise Oxygen Saturation  during 6 min walk 98 %     Max Ex. HR 138 bpm     Max Ex. BP 170/78     2 Minute Post BP 140/76  4 min: 136/70       Interval HR   1 Minute HR 113     2 Minute HR 122     3 Minute HR 130     4 Minute HR 131     5 Minute HR 137     6 Minute HR 138     2 Minute Post HR 114     Interval Heart Rate? Yes       Interval Oxygen   Interval Oxygen? Yes     Baseline Oxygen Saturation % 100 %     1 Minute Oxygen Saturation % 100 %     1 Minute Liters of Oxygen 0 L     2 Minute Oxygen Saturation % 99 %     2 Minute Liters of Oxygen 0 L     3 Minute Oxygen Saturation %  98 %     3 Minute Liters of Oxygen 0 L     4 Minute Oxygen Saturation % 100 %     4 Minute Liters of Oxygen 0 L     5 Minute Oxygen Saturation % 100 %     5 Minute Liters of Oxygen 0 L     6 Minute Oxygen Saturation % 98 %     6 Minute Liters of Oxygen 0 L     2 Minute Post Oxygen Saturation % 99 %     2 Minute Post Liters of Oxygen 0 L              Oxygen Initial Assessment:  Oxygen Initial Assessment - 06/15/22 1139       Home Oxygen   Home Oxygen Device Home Concentrator;E-Tanks    Sleep Oxygen Prescription CPAP    Home Exercise Oxygen Prescription Continuous    Liters per minute 2    Compliance with Home Oxygen Use Yes      Initial 6 min Walk   Oxygen Used None      Program Oxygen Prescription   Program Oxygen Prescription None      Intervention   Short Term Goals To learn and understand importance of maintaining oxygen saturations>88%;To learn and exhibit compliance with exercise, home and travel O2 prescription;To learn and  demonstrate proper use of respiratory medications;To learn and understand importance of monitoring SPO2 with pulse oximeter and demonstrate accurate use of the pulse oximeter.;To learn and demonstrate proper pursed lip breathing techniques or other breathing techniques.     Long  Term Goals Exhibits compliance with exercise, home  and travel O2 prescription;Maintenance of O2 saturations>88%;Compliance with respiratory medication;Verbalizes importance of monitoring SPO2 with pulse oximeter and return demonstration;Exhibits proper breathing techniques, such as pursed lip breathing or other method taught during program session             Oxygen Re-Evaluation:  Oxygen Re-Evaluation     Row Name 06/21/22 1601 07/12/22 1618           Program Oxygen Prescription   Program Oxygen Prescription None None        Home Oxygen   Home Oxygen Device Home Concentrator;E-Tanks Home Concentrator;E-Tanks      Sleep Oxygen Prescription CPAP CPAP      Home Exercise Oxygen Prescription Continuous Continuous      Liters per minute 2 2      Compliance with Home Oxygen Use Yes Yes        Goals/Expected Outcomes   Short Term Goals To learn and understand importance of maintaining oxygen saturations>88%;To learn and exhibit compliance with exercise, home and travel O2 prescription;To learn and demonstrate proper use of respiratory medications;To learn and understand importance of monitoring SPO2 with pulse oximeter and demonstrate accurate use of the pulse oximeter.;To learn and demonstrate proper pursed lip breathing techniques or other breathing techniques.  To learn and understand importance of maintaining oxygen saturations>88%;To learn and exhibit compliance with exercise, home and travel O2 prescription;To learn and demonstrate proper use of respiratory medications;To learn and understand importance of monitoring SPO2 with pulse oximeter and demonstrate accurate use of the pulse oximeter.;To learn and  demonstrate proper pursed lip breathing techniques or other breathing techniques.       Long  Term Goals Exhibits compliance with exercise, home  and travel O2 prescription;Maintenance of O2 saturations>88%;Compliance with respiratory medication;Verbalizes importance of monitoring SPO2 with pulse oximeter and return demonstration;Exhibits proper breathing techniques, such as  pursed lip breathing or other method taught during program session Exhibits compliance with exercise, home  and travel O2 prescription;Maintenance of O2 saturations>88%;Compliance with respiratory medication;Verbalizes importance of monitoring SPO2 with pulse oximeter and return demonstration;Exhibits proper breathing techniques, such as pursed lip breathing or other method taught during program session      Goals/Expected Outcomes Compliance and understanding of oxygen saturation monitoring and breathing techniques to decrease shortness of breath. Compliance and understanding of oxygen saturation monitoring and breathing techniques to decrease shortness of breath.               Oxygen Discharge (Final Oxygen Re-Evaluation):  Oxygen Re-Evaluation - 07/12/22 1618       Program Oxygen Prescription   Program Oxygen Prescription None      Home Oxygen   Home Oxygen Device Home Concentrator;E-Tanks    Sleep Oxygen Prescription CPAP    Home Exercise Oxygen Prescription Continuous    Liters per minute 2    Compliance with Home Oxygen Use Yes      Goals/Expected Outcomes   Short Term Goals To learn and understand importance of maintaining oxygen saturations>88%;To learn and exhibit compliance with exercise, home and travel O2 prescription;To learn and demonstrate proper use of respiratory medications;To learn and understand importance of monitoring SPO2 with pulse oximeter and demonstrate accurate use of the pulse oximeter.;To learn and demonstrate proper pursed lip breathing techniques or other breathing techniques.     Long   Term Goals Exhibits compliance with exercise, home  and travel O2 prescription;Maintenance of O2 saturations>88%;Compliance with respiratory medication;Verbalizes importance of monitoring SPO2 with pulse oximeter and return demonstration;Exhibits proper breathing techniques, such as pursed lip breathing or other method taught during program session    Goals/Expected Outcomes Compliance and understanding of oxygen saturation monitoring and breathing techniques to decrease shortness of breath.             Initial Exercise Prescription:  Initial Exercise Prescription - 06/15/22 1100       Date of Initial Exercise RX and Referring Provider   Date 06/15/22    Referring Provider [name]    Expected Discharge Date 08/18/22      Recumbant Elliptical   Level 1    Minutes 15    METs 2      Track   Minutes 15    METs 3.6      Prescription Details   Frequency (times per week) 2    Duration Progress to 30 minutes of continuous aerobic without signs/symptoms of physical distress      Intensity   THRR 40-80% of Max Heartrate 65-131    Ratings of Perceived Exertion 11-13    Perceived Dyspnea 0-4      Progression   Progression Continue progressive overload as per policy without signs/symptoms or physical distress.      Resistance Training   Training Prescription Yes    Weight red bands    Reps 10-15             Perform Capillary Blood Glucose checks as needed.  Exercise Prescription Changes:   Exercise Prescription Changes     Row Name 06/21/22 1200 07/05/22 1200 07/14/22 1201 07/15/22 0900       Response to Exercise   Blood Pressure (Admit) 132/80 98/52 138/80 --    Blood Pressure (Exercise) 132/68 112/60 -- --    Blood Pressure (Exit) 120/80 112/58 114/60 --    Heart Rate (Admit) 117 bpm 89 bpm 102 bpm --    Heart  Rate (Exercise) 127 bpm 114 bpm 108 bpm --    Heart Rate (Exit) 112 bpm 116 bpm 107 bpm --    Oxygen Saturation (Admit) 98 % 100 % 98 % --    Oxygen  Saturation (Exercise) 95 % 98 % 95 % --    Oxygen Saturation (Exit) 100 % 97 % 100 % --    Rating of Perceived Exertion (Exercise) 11 13 14 $ --    Perceived Dyspnea (Exercise) 0 1 3 --    Duration Progress to 30 minutes of  aerobic without signs/symptoms of physical distress Progress to 30 minutes of  aerobic without signs/symptoms of physical distress Progress to 30 minutes of  aerobic without signs/symptoms of physical distress --    Intensity THRR unchanged THRR unchanged THRR unchanged --      Progression   Progression Continue to progress workloads to maintain intensity without signs/symptoms of physical distress. Continue to progress workloads to maintain intensity without signs/symptoms of physical distress. Continue to progress workloads to maintain intensity without signs/symptoms of physical distress. --      Horticulturist, commercial Prescription Yes Yes Yes --    Weight red bands red bands red bands --    Reps 10-15 10-15 10-15 --    Time 10 Minutes 10 Minutes 10 Minutes --      NuStep   Level 1 3 2 $ --    SPM 60 60 60 --    Minutes 15 15 15 $ --    METs 1.8 1.9 2 --      Recumbant Elliptical   Level 2 -- -- --    Minutes 15 -- -- --    METs 3.6 -- -- --      Track   Laps -- -- 6 --    Minutes -- 15 15 --    METs -- 2.54 1.92 --      Home Exercise Plan   Plans to continue exercise at -- -- -- Home (comment)    Frequency -- -- -- --  N/A    Initial Home Exercises Provided -- -- -- 07/15/22             Exercise Comments:   Exercise Comments     Row Name 06/21/22 1207 07/15/22 0928         Exercise Comments Pt completed first day of exercise. She exercised for 13 min on the octane and 15 min on the Nustep. Chanler stated that the octane hurt her buttocks. She averaged 3.6 METs at level 2 on the octane and 1.8 METs at level 1. Jayani performed the warmup and cooldown standing/ seated based on her pain and shortness of breath. Discussed METs and how to  increase METs. Completed home exercise plan. Lexiss is currently exercising at home. She exercises to walking video 2-3 non-rehab days/wk for about 60 min/day with rest breaks. I told Ariyelle that I was satisfied with her current home exercise. Kennidi mentioned that she wished to join a fitness center to do exercise classes. 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 I encouraged Karenda to call the [employer] to discuss different class  offerings. Yan agreed with my recommendations. I will follow up with her to see if she has called the [facility]. Aliyyah is very motivated to exercise and improve her functional capacity.               Exercise Goals and Review:   Exercise Goals  [place] Name 06/15/22 1017 06/21/22 1555 07/12/22 1613         Exercise Goals   Increase Physical Activity Yes Yes Yes     Intervention Provide advice, education, support and counseling about physical activity/exercise needs.;Develop an individualized exercise prescription for aerobic and resistive training based on initial evaluation findings, risk stratification, comorbidities and participant's personal goals. Provide advice, education, support and counseling about physical activity/exercise needs.;Develop an individualized exercise prescription for aerobic and resistive training based on initial evaluation findings, risk stratification, comorbidities and participant's personal goals. Provide advice, education, support and counseling about physical activity/exercise needs.;Develop an individualized exercise prescription for aerobic and resistive training based on initial evaluation findings, risk stratification, comorbidities and participant's personal goals.     Expected Outcomes Short Term: Attend rehab on a regular basis to increase amount of physical activity.;Long Term: Exercising regularly at least 3-5 days a week.;Long Term: Add in home exercise to make exercise part of routine and to increase amount of  physical activity. Short Term: Attend rehab on a regular basis to increase amount of physical activity.;Long Term: Exercising regularly at least 3-5 days a week.;Long Term: Add in home exercise to make exercise part of routine and to increase amount of physical activity. Short Term: Attend rehab on a regular basis to increase amount of physical activity.;Long Term: Exercising regularly at least 3-5 days a week.;Long Term: Add in home exercise to make exercise part of routine and to increase amount of physical activity.     Increase Strength and Stamina Yes Yes Yes     Intervention Provide advice, education, support and counseling about physical activity/exercise needs.;Develop an individualized exercise prescription for aerobic and resistive training based on initial evaluation findings, risk stratification, comorbidities and participant's personal goals. Provide advice, education, support and counseling about physical activity/exercise needs.;Develop an individualized exercise prescription for aerobic and resistive training based on initial evaluation findings, risk stratification, comorbidities and participant's personal goals. Provide advice, education, support and counseling about physical activity/exercise needs.;Develop an individualized exercise prescription for aerobic and resistive training based on initial evaluation findings, risk stratification, comorbidities and participant's personal goals.     Expected Outcomes Short Term: Increase workloads from initial exercise prescription for resistance, speed, and METs.;Short Term: Perform resistance training exercises routinely during rehab and add in resistance training at home;Long Term: Improve cardiorespiratory fitness, muscular endurance and strength as measured by increased METs and functional capacity (6MWT) Short Term: Increase workloads from initial exercise prescription for resistance, speed, and METs.;Short Term: Perform resistance training  exercises routinely during rehab and add in resistance training at home;Long Term: Improve cardiorespiratory fitness, muscular endurance and strength as measured by increased METs and functional capacity (6MWT) Short Term: Increase workloads from initial exercise prescription for resistance, speed, and METs.;Short Term: Perform resistance training exercises routinely during rehab and add in resistance training at home;Long Term: Improve cardiorespiratory fitness, muscular endurance and strength as measured by increased METs and functional capacity (6MWT)     Able to understand and use rate of perceived exertion (RPE) scale Yes Yes Yes     Intervention Provide education and explanation on how to use RPE scale Provide education and explanation on how to use RPE scale Provide education and explanation on how to use RPE scale     Expected Outcomes Short Term: Able to use RPE daily in rehab to express subjective intensity level;Long Term:  Able to use RPE to guide intensity level when exercising independently Short Term: Able to use RPE daily in rehab  to express subjective intensity level;Long Term:  Able to use RPE to guide intensity level when exercising independently Short Term: Able to use RPE daily in rehab to express subjective intensity level;Long Term:  Able to use RPE to guide intensity level when exercising independently     Able to understand and use Dyspnea scale Yes Yes Yes     Intervention Provide education and explanation on how to use Dyspnea scale Provide education and explanation on how to use Dyspnea scale Provide education and explanation on how to use Dyspnea scale     Expected Outcomes Short Term: Able to use Dyspnea scale daily in rehab to express subjective sense of shortness of breath during exertion;Long Term: Able to use Dyspnea scale to guide intensity level when exercising independently Short Term: Able to use Dyspnea scale daily in rehab to express subjective sense of shortness of  breath during exertion;Long Term: Able to use Dyspnea scale to guide intensity level when exercising independently Short Term: Able to use Dyspnea scale daily in rehab to express subjective sense of shortness of breath during exertion;Long Term: Able to use Dyspnea scale to guide intensity level when exercising independently     Knowledge and understanding of Target Heart Rate Range (THRR) Yes Yes Yes     Intervention Provide education and explanation of THRR including how the numbers were predicted and where they are located for reference Provide education and explanation of THRR including how the numbers were predicted and where they are located for reference Provide education and explanation of THRR including how the numbers were predicted and where they are located for reference     Expected Outcomes Short Term: Able to state/look up THRR;Long Term: Able to use THRR to govern intensity when exercising independently;Short Term: Able to use daily as guideline for intensity in rehab Short Term: Able to state/look up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 Yes    Intervention Provide education, individualized exercise plan and daily activity instruction to help decrease symptoms of SOB with activities of daily living.    Expected Outcomes Short Term: Improve cardiorespiratory fitness to achieve a reduction of symptoms when performing ADLs;Long Term: Be able to perform more ADLs without symptoms or delay the onset of symptoms    Increase knowledge of respiratory medications and ability to use respiratory devices properly  Yes    Intervention Provide education and demonstration as needed of appropriate use of medications, inhalers, and oxygen therapy.    Expected Outcomes Short Term: Achieves understanding of medications use. Understands that oxygen is a medication prescribed by physician. Demonstrates appropriate use of inhaler and oxygen therapy.;Long Term: Maintain appropriate use of medications, inhalers, and oxygen therapy.    Stress Yes    Intervention Offer individual and/or small group education and counseling on adjustment to heart disease, stress management and health-related lifestyle change. Teach and support self-help strategies.;Refer participants experiencing significant psychosocial distress to appropriate mental health specialists for further evaluation and treatment. When possible, include family members and significant others in education/counseling sessions.    Expected Outcomes Short Term: Participant demonstrates changes in health-related behavior, relaxation and other stress management skills, ability to obtain effective social support, and compliance with psychotropic medications if prescribed.;Long Term: Emotional wellbeing is  indicated by absence of clinically significant psychosocial distress or social isolation.             Core Components/Risk Factors/Patient Goals Review:   Goals and Risk Factor Review     Row Name 06/20/22 1057 07/13/22 1435           Core Components/Risk Factors/Patient Goals Review   Personal Goals Review Weight Management/Obesity;Increase knowledge of respiratory medications and ability to use respiratory devices properly.;Stress;Improve shortness of breath with ADL's;Develop more efficient breathing techniques such as purse lipped breathing and diaphragmatic breathing and practicing self-pacing with activity. Weight Management/Obesity;Increase knowledge of respiratory medications and ability to use respiratory devices properly.;Stress;Improve shortness of breath with ADL's;Develop more efficient breathing techniques such as purse lipped breathing and diaphragmatic breathing and practicing self-pacing with activity.      Review Kerra has not started PR class yet. She is scheduled to start on 1/16. We look forward to educating and teaching Kellsey about the above topics. Deasya has completed 6 PR classes so far. She enjoys working out on [employer] and walking the track. She has been able to increase her workload and METs on the [employer] and increase her laps on the track. She knows how to report her Rate of Perceived Exertion and her Dyspnea scale to staff. Thanya has  attended oxygen use and oxygen safety classes. Richetta is also working with the nutrionist to achieve weight loss. Bryli enjoys coming to PR and we will continue to work with Estill Bamberg to obtain her goals.      Expected Outcomes See Admission Goals See Admission Goals               Core Components/Risk Factors/Patient Goals at Discharge (Final Review):   Goals and Risk Factor Review - 07/13/22 1435       Core Components/Risk Factors/Patient Goals Review   Personal Goals Review Weight Management/Obesity;Increase knowledge  of respiratory medications and ability to use respiratory devices properly.;Stress;Improve shortness of breath with ADL's;Develop more efficient breathing techniques such as purse lipped breathing and diaphragmatic breathing and practicing self-pacing with activity.    Review Enes has completed 6 PR classes so far. She enjoys working out on [employer] and walking the track. She has been able to increase her workload and METs on the [employer] and increase her laps on the track. She knows how to report her Rate of Perceived Exertion and her Dyspnea scale to staff. Gwynne has attended oxygen use and oxygen safety classes. Margurette is also working with the nutrionist to achieve weight loss. Jamine enjoys coming to PR and we will continue to work with Estill Bamberg to obtain her goals.    Expected Outcomes See Admission Goals             ITP Comments:   Comments: Dr. Rodman Pickle is Medical Director for Pulmonary Rehab at [facility].

## 2022-07-21 ENCOUNTER — Encounter (HOSPITAL_COMMUNITY): Payer: Medicare Other

## 2022-07-21 ENCOUNTER — Ambulatory Visit: Payer: Medicare Other | Admitting: Orthopedic Surgery

## 2022-07-22 ENCOUNTER — Encounter (HOSPITAL_COMMUNITY): Payer: Self-pay

## 2022-07-22 ENCOUNTER — Inpatient Hospital Stay: Payer: Medicare Other | Attending: Hematology

## 2022-07-22 ENCOUNTER — Emergency Department (HOSPITAL_COMMUNITY): Payer: Medicare Other

## 2022-07-22 ENCOUNTER — Inpatient Hospital Stay (HOSPITAL_COMMUNITY)
Admission: EM | Admit: 2022-07-22 | Discharge: 2022-07-29 | DRG: 871 | Disposition: A | Discharge status: Home Health | Payer: Medicare Other | Attending: Internal Medicine | Admitting: Internal Medicine

## 2022-07-22 ENCOUNTER — Inpatient Hospital Stay: Payer: Medicare Other

## 2022-07-22 DIAGNOSIS — U099 Post covid-19 condition, unspecified: Secondary | ICD-10-CM | POA: Diagnosis not present

## 2022-07-22 DIAGNOSIS — R0602 Shortness of breath: Secondary | ICD-10-CM | POA: Diagnosis not present

## 2022-07-22 DIAGNOSIS — I959 Hypotension, unspecified: Secondary | ICD-10-CM | POA: Diagnosis not present

## 2022-07-22 DIAGNOSIS — R6889 Other general symptoms and signs: Secondary | ICD-10-CM | POA: Diagnosis not present

## 2022-07-22 DIAGNOSIS — Z880 Allergy status to penicillin: Secondary | ICD-10-CM

## 2022-07-22 DIAGNOSIS — Z853 Personal history of malignant neoplasm of breast: Secondary | ICD-10-CM

## 2022-07-22 DIAGNOSIS — N1832 Chronic kidney disease, stage 3b: Secondary | ICD-10-CM | POA: Diagnosis present

## 2022-07-22 DIAGNOSIS — Z8261 Family history of arthritis: Secondary | ICD-10-CM

## 2022-07-22 DIAGNOSIS — R404 Transient alteration of awareness: Secondary | ICD-10-CM | POA: Diagnosis not present

## 2022-07-22 DIAGNOSIS — I129 Hypertensive chronic kidney disease with stage 1 through stage 4 chronic kidney disease, or unspecified chronic kidney disease: Secondary | ICD-10-CM | POA: Diagnosis not present

## 2022-07-22 DIAGNOSIS — R5381 Other malaise: Secondary | ICD-10-CM | POA: Diagnosis present

## 2022-07-22 DIAGNOSIS — C50919 Malignant neoplasm of unspecified site of unspecified female breast: Secondary | ICD-10-CM | POA: Diagnosis present

## 2022-07-22 DIAGNOSIS — Z8774 Personal history of (corrected) congenital malformations of heart and circulatory system: Secondary | ICD-10-CM

## 2022-07-22 DIAGNOSIS — E669 Obesity, unspecified: Secondary | ICD-10-CM | POA: Diagnosis present

## 2022-07-22 DIAGNOSIS — Z833 Family history of diabetes mellitus: Secondary | ICD-10-CM

## 2022-07-22 DIAGNOSIS — Z79899 Other long term (current) drug therapy: Secondary | ICD-10-CM

## 2022-07-22 DIAGNOSIS — K760 Fatty (change of) liver, not elsewhere classified: Secondary | ICD-10-CM | POA: Diagnosis not present

## 2022-07-22 DIAGNOSIS — F419 Anxiety disorder, unspecified: Secondary | ICD-10-CM | POA: Diagnosis present

## 2022-07-22 DIAGNOSIS — Z7951 Long term (current) use of inhaled steroids: Secondary | ICD-10-CM

## 2022-07-22 DIAGNOSIS — Z743 Need for continuous supervision: Secondary | ICD-10-CM | POA: Diagnosis not present

## 2022-07-22 DIAGNOSIS — Z881 Allergy status to other antibiotic agents status: Secondary | ICD-10-CM

## 2022-07-22 DIAGNOSIS — Q2111 Secundum atrial septal defect: Secondary | ICD-10-CM

## 2022-07-22 DIAGNOSIS — R7989 Other specified abnormal findings of blood chemistry: Secondary | ICD-10-CM | POA: Diagnosis not present

## 2022-07-22 DIAGNOSIS — J189 Pneumonia, unspecified organism: Secondary | ICD-10-CM | POA: Diagnosis present

## 2022-07-22 DIAGNOSIS — E876 Hypokalemia: Secondary | ICD-10-CM | POA: Diagnosis not present

## 2022-07-22 DIAGNOSIS — A419 Sepsis, unspecified organism: Secondary | ICD-10-CM | POA: Diagnosis not present

## 2022-07-22 DIAGNOSIS — R079 Chest pain, unspecified: Secondary | ICD-10-CM | POA: Diagnosis not present

## 2022-07-22 DIAGNOSIS — D539 Nutritional anemia, unspecified: Secondary | ICD-10-CM | POA: Diagnosis not present

## 2022-07-22 DIAGNOSIS — I9589 Other hypotension: Secondary | ICD-10-CM | POA: Diagnosis present

## 2022-07-22 DIAGNOSIS — Z91120 Patient's intentional underdosing of medication regimen due to financial hardship: Secondary | ICD-10-CM

## 2022-07-22 DIAGNOSIS — Z9981 Dependence on supplemental oxygen: Secondary | ICD-10-CM

## 2022-07-22 DIAGNOSIS — K219 Gastro-esophageal reflux disease without esophagitis: Secondary | ICD-10-CM | POA: Diagnosis not present

## 2022-07-22 DIAGNOSIS — J159 Unspecified bacterial pneumonia: Secondary | ICD-10-CM | POA: Diagnosis present

## 2022-07-22 DIAGNOSIS — I499 Cardiac arrhythmia, unspecified: Secondary | ICD-10-CM | POA: Diagnosis not present

## 2022-07-22 DIAGNOSIS — U071 COVID-19: Secondary | ICD-10-CM | POA: Diagnosis present

## 2022-07-22 DIAGNOSIS — Z87891 Personal history of nicotine dependence: Secondary | ICD-10-CM

## 2022-07-22 DIAGNOSIS — R0600 Dyspnea, unspecified: Secondary | ICD-10-CM | POA: Diagnosis not present

## 2022-07-22 DIAGNOSIS — C799 Secondary malignant neoplasm of unspecified site: Secondary | ICD-10-CM | POA: Diagnosis not present

## 2022-07-22 DIAGNOSIS — T380X5A Adverse effect of glucocorticoids and synthetic analogues, initial encounter: Secondary | ICD-10-CM | POA: Diagnosis not present

## 2022-07-22 DIAGNOSIS — J9611 Chronic respiratory failure with hypoxia: Secondary | ICD-10-CM | POA: Diagnosis not present

## 2022-07-22 DIAGNOSIS — Z8249 Family history of ischemic heart disease and other diseases of the circulatory system: Secondary | ICD-10-CM

## 2022-07-22 DIAGNOSIS — E785 Hyperlipidemia, unspecified: Secondary | ICD-10-CM | POA: Diagnosis not present

## 2022-07-22 DIAGNOSIS — R0902 Hypoxemia: Secondary | ICD-10-CM | POA: Diagnosis not present

## 2022-07-22 DIAGNOSIS — G4733 Obstructive sleep apnea (adult) (pediatric): Secondary | ICD-10-CM | POA: Diagnosis present

## 2022-07-22 DIAGNOSIS — Z801 Family history of malignant neoplasm of trachea, bronchus and lung: Secondary | ICD-10-CM

## 2022-07-22 DIAGNOSIS — J1282 Pneumonia due to coronavirus disease 2019: Secondary | ICD-10-CM | POA: Diagnosis not present

## 2022-07-22 DIAGNOSIS — R739 Hyperglycemia, unspecified: Secondary | ICD-10-CM | POA: Diagnosis not present

## 2022-07-22 DIAGNOSIS — T50996A Underdosing of other drugs, medicaments and biological substances, initial encounter: Secondary | ICD-10-CM | POA: Diagnosis present

## 2022-07-22 DIAGNOSIS — Z888 Allergy status to other drugs, medicaments and biological substances status: Secondary | ICD-10-CM

## 2022-07-22 DIAGNOSIS — Z6830 Body mass index (BMI) 30.0-30.9, adult: Secondary | ICD-10-CM

## 2022-07-22 DIAGNOSIS — N183 Chronic kidney disease, stage 3 unspecified: Secondary | ICD-10-CM | POA: Diagnosis present

## 2022-07-22 LAB — LACTIC ACID, PLASMA: Lactic Acid, Venous: 1 mmol/L (ref 0.5–1.9)

## 2022-07-22 LAB — CBC WITH DIFFERENTIAL/PLATELET

## 2022-07-22 MED ORDER — SODIUM CHLORIDE 0.9 % IV SOLN
2.0000 g | Freq: Once | INTRAVENOUS | Status: AC
  Administered 2022-07-23: 2 g via INTRAVENOUS
  Filled 2022-07-22: qty 12.5

## 2022-07-22 MED ORDER — METRONIDAZOLE 500 MG/100ML IV SOLN
500.0000 mg | Freq: Once | INTRAVENOUS | Status: AC
  Administered 2022-07-23: 500 mg via INTRAVENOUS
  Filled 2022-07-22: qty 100

## 2022-07-22 MED ORDER — VANCOMYCIN HCL IN DEXTROSE 1-5 GM/200ML-% IV SOLN: 1000.0000 mg | Freq: Once | INTRAVENOUS | Status: DC

## 2022-07-22 MED ORDER — LACTATED RINGERS IV BOLUS (SEPSIS)
1000.0000 mL | Freq: Once | INTRAVENOUS | Status: AC
  Administered 2022-07-23: 1000 mL via INTRAVENOUS

## 2022-07-22 MED ORDER — LACTATED RINGERS IV BOLUS (SEPSIS)
1000.0000 mL | Freq: Once | INTRAVENOUS | Status: AC
  Administered 2022-07-22: 1000 mL via INTRAVENOUS

## 2022-07-22 MED ORDER — LACTATED RINGERS IV SOLN: INTRAVENOUS | Status: DC

## 2022-07-22 MED ORDER — VANCOMYCIN HCL 2000 MG/400ML IV SOLN
2000.0000 mg | Freq: Once | INTRAVENOUS | Status: AC
  Administered 2022-07-23: 2000 mg via INTRAVENOUS
  Filled 2022-07-22: qty 400

## 2022-07-22 NOTE — Progress Notes (Signed)
A consult was received from an ED physician for Vancomycin and Cefepime per pharmacy dosing.  The patient's profile has been reviewed for ht/wt/allergies/indication/available labs.    A one time order has been placed for Vancomycin 2gm IV and Cefepime 2gm IV.    Further antibiotics/pharmacy consults should be ordered by admitting physician if indicated.                       Thank you, Everette Rank, PharmD 07/22/2022  11:43 PM

## 2022-07-22 NOTE — ED Triage Notes (Signed)
Pt comes via [facility] for SOB, nausea and dizziness diagnosed with covid last week. Pt has a productive cough with yellow sputum, rhonchi all over, wears CPAP at night, PTA received 91m zofran and 650 mg of tylenol

## 2022-07-22 NOTE — ED Provider Notes (Signed)
[facility] Provider Note   CSN: [id number] Arrival date & time: 07/22/22  2036     History {Add pertinent medical, surgical, social history, OB history to HPI:1} Chief Complaint  Patient presents with   Shortness of Breath    Vernamae Cripe is a 57 y.o. female.   Shortness of Breath      Home Medications Prior to Admission medications   Medication Sig Start Date End Date Taking? Authorizing Provider  abemaciclib (VERZENIO) 100 MG tablet Take 1 tablet (100 mg total) by mouth 2 (two) times daily. 05/17/22   Brunetta Genera, MD  acetaminophen (TYLENOL) 500 MG tablet Take 500 mg by mouth daily as needed for moderate pain.    [provider]  albuterol (PROVENTIL) (2.5 MG/3ML) 0.083% nebulizer solution Take 3 mLs (2.5 mg total) by nebulization every 6 (six) hours as needed for wheezing or shortness of breath. 06/16/22   Margaretha Seeds, MD  albuterol (VENTOLIN HFA) 108 (90 Base) MCG/ACT inhaler Inhale 2 puffs into the lungs every 6 (six) hours as needed for wheezing or shortness of breath. 06/16/22   Margaretha Seeds, MD  ALPRAZolam [name]) 1 MG tablet Take 1 tablet (1 mg total) by mouth at bedtime. 06/09/22   Medina-Vargas, Monina C, NP  amitriptyline (ELAVIL) 75 MG tablet TAKE 1 TABLET BY MOUTH AT BEDTIME 06/07/22   Ngetich, Dinah C, NP  Ascorbic Acid (VITAMIN C) 1000 MG tablet Take 1 tablet (1,000 mg total) by mouth daily for 7 days. 07/15/22 07/22/22  Medina-Vargas, Monina C, NP  Carboxymethylcellul-Glycerin (LUBRICATING EYE DROPS OP) Place 1 drop into the right eye in the morning, at noon, and at bedtime.    [provider]  diphenoxylate-atropine (LOMOTIL) 2.5-0.025 MG tablet Take 1 tablet by mouth 3 (three) times daily as needed for diarrhea or loose stools. 04/20/21   Brunetta Genera, MD  ezetimibe (ZETIA) 10 MG tablet Take 1 tablet (10 mg total) by mouth daily. 06/16/22   Ngetich, Dinah C, NP   fluticasone-salmeterol (ADVAIR) 250-50 MCG/ACT AEPB INHALE 1 INHALATION BY MOUTH IN  THE MORNING AND AT BEDTIME 06/28/22   Margaretha Seeds, MD  loperamide (IMODIUM) 2 MG capsule Take 1-2 capsules (2-4 mg total) by mouth 4 (four) times daily as needed for diarrhea or loose stools. Patient taking differently: Take 4 mg by mouth as needed for diarrhea or loose stools. 05/10/21   Brunetta Genera, MD  methocarbamol (ROBAXIN) 500 MG tablet Take 1 tablet (500 mg total) by mouth every 6 (six) hours as needed for muscle spasms. Patient taking differently: Take 500 mg by mouth 3 (three) times daily as needed for muscle spasms. 01/10/22   [name], MD  midodrine (PROAMATINE) 5 MG tablet Take 1 tablet (5 mg total) by mouth 3 (three) times daily with meals. 06/09/22   Medina-Vargas, Monina C, NP  Multiple Vitamin (MULTIVITAMIN WITH MINERALS) TABS tablet Take 1 tablet by mouth daily.    [provider]  omeprazole (PRILOSEC OTC) 20 MG tablet Take 20 mg by mouth daily.    [provider]  potassium chloride SA (KLOR-CON M) 20 MEQ tablet Take 1 tablet (20 mEq total) by mouth 2 (two) times daily. For 14 days 05/24/22   Orson Slick, MD  traMADol (ULTRAM-ER) 100 MG 24 hr tablet Take 1 tablet (100 mg total) by mouth daily. 06/07/22   Gillis Santa, MD  triamcinolone cream (KENALOG) 0.1 % Apply 1  Application topically 2 (two) times daily. 06/09/22   Medina-Vargas, Monina C, NP  Vitamin D, Ergocalciferol, (DRISDOL) 1.25 MG (50000 UNIT) CAPS capsule TAKE 1 CAPSULE ONCE PER WEEK 06/09/22   Medina-Vargas, Monina C, NP  White Petrolatum-Mineral Oil (LUBRICANT EYE NIGHTTIME) OINT Place 1 drop into the left eye at bedtime as needed (dryness).    [provider]  XIIDRA 5 % SOLN Place 1 drop into the left eye in the morning, at noon, and at bedtime. 03/03/22   [provider]  zinc gluconate 50 MG tablet Take 1 tablet (50 mg total) by mouth daily for 7 days. 07/15/22 07/22/22   Medina-Vargas, Monina C, NP      Allergies    Zithromax [azithromycin], Tramadol, and Psyllium    Review of Systems   Review of Systems  Respiratory:  Positive for shortness of breath.     Physical Exam Updated Vital Signs BP (!) 105/58 (BP Location: Left Arm)   Pulse (!) 121   Temp 98.6 F (37 C) (Oral)   Resp 20   Ht 5' 8"$  (1.727 m)   Wt 90.7 kg   SpO2 95%   BMI 30.41 kg/m  Physical Exam  ED Results / Procedures / Treatments   Labs (all labs ordered are listed, but only abnormal results are displayed) Labs Reviewed - No data to display  EKG None  Radiology No results found.  Procedures Procedures  {Document cardiac monitor, telemetry assessment procedure when appropriate:1}  Medications Ordered in ED Medications - No data to display  ED Course/ Medical Decision Making/ A&P   {   Click here for ABCD2, HEART and other calculatorsREFRESH Note before signing :1}                          Medical Decision Making Amount and/or Complexity of Data Reviewed Labs: ordered. Radiology: ordered.   MINA BATDORF [name] is a 57 y.o. female with a past medical history significant for migraines, GERD, hypertension, hyperlipidemia, CKD, chronic 2 L oxygen use from chronic respiratory failure, and sleep apnea normally on CPAP and recently diagnosed with COVID-19 presents for worsening breathing, cough, fatigue, chills, nausea, and diarrhea.  Patient is coming by family who reports that her breathing has aggressively worsened over the last 48 hours despite the medications she has been taking.  She was given molnupiravir 7 days ago as well as vitamin C, and zinc gluconate.  Family reports that her breathing has worsened and she feels like she is feeling worse overall.  On my exam, lungs have coarseness but she was not wheezing.  Chest was nontender.  Abdomen nontender.  She is on her home oxygen but was tachycardic and tachypneic.  She was afebrile initially and was not  hypotensive.  Abdomen nontender.  No significant edema seen.  Patient otherwise resting but uncomfortable.  Clinically I am concerned about possible development of post COVID-pneumonia or just worsening COVID overall.  Given her lack of chest pain or palpitations have less suspicion for a thromboembolic etiology at this time.  Will get chest x-ray, labs, and cardiac workup.  X-ray returned showing evidence of pneumonia.  She does have a new leukocytosis with a white count of 11.  Still waiting on the rest of her metabolic panel and labs but will go reassess patient to tell her about the pneumonia.  11:41 PM On reassessment, patient is now borderline hypotensive.  Blood pressure was in the 80s when I first  saw her and is now in the 90s.  She is now febrile at 101.5, tachycardic, tachypneic, with downtrending blood pressures.  Will make a code sepsis as her x-ray now shows pneumonia.  Will give fluids, broad-spectrum antibiotics, and call for admission when her labs are completed.   {Document critical care time when appropriate:1} {Document review of labs and clinical decision tools ie heart score, Chads2Vasc2 etc:1}  {Document your independent review of radiology images, and any outside records:1} {Document your discussion with family members, caretakers, and with consultants:1} {Document social determinants of health affecting pt's care:1} {Document your decision making why or why not admission, treatments were needed:1} Final Clinical Impression(s) / ED Diagnoses Final diagnoses:  None    Rx / DC Orders ED Discharge Orders     None

## 2022-07-23 ENCOUNTER — Inpatient Hospital Stay (HOSPITAL_COMMUNITY): Payer: Medicare Other

## 2022-07-23 ENCOUNTER — Encounter (HOSPITAL_COMMUNITY): Payer: Self-pay | Admitting: Internal Medicine

## 2022-07-23 DIAGNOSIS — C799 Secondary malignant neoplasm of unspecified site: Secondary | ICD-10-CM | POA: Diagnosis present

## 2022-07-23 DIAGNOSIS — Z9981 Dependence on supplemental oxygen: Secondary | ICD-10-CM | POA: Diagnosis not present

## 2022-07-23 DIAGNOSIS — R0602 Shortness of breath: Secondary | ICD-10-CM

## 2022-07-23 DIAGNOSIS — A419 Sepsis, unspecified organism: Secondary | ICD-10-CM | POA: Diagnosis present

## 2022-07-23 DIAGNOSIS — U071 COVID-19: Secondary | ICD-10-CM | POA: Insufficient documentation

## 2022-07-23 DIAGNOSIS — E785 Hyperlipidemia, unspecified: Secondary | ICD-10-CM | POA: Diagnosis present

## 2022-07-23 DIAGNOSIS — Z880 Allergy status to penicillin: Secondary | ICD-10-CM | POA: Diagnosis not present

## 2022-07-23 DIAGNOSIS — F419 Anxiety disorder, unspecified: Secondary | ICD-10-CM | POA: Diagnosis present

## 2022-07-23 DIAGNOSIS — I959 Hypotension, unspecified: Secondary | ICD-10-CM | POA: Diagnosis present

## 2022-07-23 DIAGNOSIS — I129 Hypertensive chronic kidney disease with stage 1 through stage 4 chronic kidney disease, or unspecified chronic kidney disease: Secondary | ICD-10-CM | POA: Diagnosis present

## 2022-07-23 DIAGNOSIS — J1282 Pneumonia due to coronavirus disease 2019: Secondary | ICD-10-CM | POA: Diagnosis present

## 2022-07-23 DIAGNOSIS — Z79899 Other long term (current) drug therapy: Secondary | ICD-10-CM | POA: Diagnosis not present

## 2022-07-23 DIAGNOSIS — R7989 Other specified abnormal findings of blood chemistry: Secondary | ICD-10-CM | POA: Insufficient documentation

## 2022-07-23 DIAGNOSIS — J189 Pneumonia, unspecified organism: Secondary | ICD-10-CM | POA: Diagnosis not present

## 2022-07-23 DIAGNOSIS — J159 Unspecified bacterial pneumonia: Secondary | ICD-10-CM | POA: Diagnosis present

## 2022-07-23 DIAGNOSIS — J9611 Chronic respiratory failure with hypoxia: Secondary | ICD-10-CM | POA: Diagnosis present

## 2022-07-23 DIAGNOSIS — K760 Fatty (change of) liver, not elsewhere classified: Secondary | ICD-10-CM | POA: Diagnosis present

## 2022-07-23 DIAGNOSIS — U099 Post covid-19 condition, unspecified: Secondary | ICD-10-CM | POA: Diagnosis present

## 2022-07-23 DIAGNOSIS — Z7951 Long term (current) use of inhaled steroids: Secondary | ICD-10-CM | POA: Diagnosis not present

## 2022-07-23 DIAGNOSIS — I9589 Other hypotension: Secondary | ICD-10-CM | POA: Diagnosis present

## 2022-07-23 DIAGNOSIS — D539 Nutritional anemia, unspecified: Secondary | ICD-10-CM | POA: Diagnosis present

## 2022-07-23 DIAGNOSIS — E669 Obesity, unspecified: Secondary | ICD-10-CM | POA: Diagnosis present

## 2022-07-23 DIAGNOSIS — K219 Gastro-esophageal reflux disease without esophagitis: Secondary | ICD-10-CM | POA: Diagnosis present

## 2022-07-23 DIAGNOSIS — Z881 Allergy status to other antibiotic agents status: Secondary | ICD-10-CM | POA: Diagnosis not present

## 2022-07-23 DIAGNOSIS — N1832 Chronic kidney disease, stage 3b: Secondary | ICD-10-CM | POA: Diagnosis present

## 2022-07-23 DIAGNOSIS — E876 Hypokalemia: Secondary | ICD-10-CM | POA: Diagnosis present

## 2022-07-23 LAB — CBC WITH DIFFERENTIAL/PLATELET
Abs Immature Granulocytes: 0 10*3/uL (ref 0.00–0.07)
Abs Immature Granulocytes: 0.08 10*3/uL — ABNORMAL HIGH (ref 0.00–0.07)
Band Neutrophils: 2 %
Basophils Absolute: 0 10*3/uL (ref 0.0–0.1)
Basophils Absolute: 0.1 10*3/uL (ref 0.0–0.1)
Basophils Relative: 0 %
Basophils Relative: 1 %
Eosinophils Absolute: 0 10*3/uL (ref 0.0–0.5)
Eosinophils Absolute: 0.1 10*3/uL (ref 0.0–0.5)
Eosinophils Relative: 0 %
Eosinophils Relative: 1 %
HCT: 35 % — ABNORMAL LOW (ref 36.0–46.0)
HCT: 40.7 % (ref 36.0–46.0)
Hemoglobin: 11.6 g/dL — ABNORMAL LOW (ref 12.0–15.0)
Hemoglobin: 12.9 g/dL (ref 12.0–15.0)
Immature Granulocytes: 1 %
Lymphocytes Relative: 23 %
Lymphocytes Relative: 22 %
Lymphs Abs: 2.5 10*3/uL (ref 0.7–4.0)
Lymphs Abs: 2.2 10*3/uL (ref 0.7–4.0)
MCH: 35.7 pg — ABNORMAL HIGH (ref 26.0–34.0)
MCH: 35.9 pg — ABNORMAL HIGH (ref 26.0–34.0)
MCHC: 33.1 g/dL (ref 30.0–36.0)
MCHC: 31.7 g/dL (ref 30.0–36.0)
MCV: 107.7 fL — ABNORMAL HIGH (ref 80.0–100.0)
MCV: 113.4 fL — ABNORMAL HIGH (ref 80.0–100.0)
Monocytes Absolute: 0.1 10*3/uL (ref 0.1–1.0)
Monocytes Absolute: 0.5 10*3/uL (ref 0.1–1.0)
Monocytes Relative: 1 %
Monocytes Relative: 5 %
Neutro Abs: 8.4 10*3/uL — ABNORMAL HIGH (ref 1.7–7.7)
Neutro Abs: 7.2 10*3/uL (ref 1.7–7.7)
Neutrophils Relative %: 74 %
Neutrophils Relative %: 70 %
Platelets: 212 10*3/uL (ref 150–400)
Platelets: 189 10*3/uL (ref 150–400)
RBC: 3.25 MIL/uL — ABNORMAL LOW (ref 3.87–5.11)
RBC: 3.59 MIL/uL — ABNORMAL LOW (ref 3.87–5.11)
RDW: 14.6 % (ref 11.5–15.5)
RDW: 14.6 % (ref 11.5–15.5)
WBC: 11 10*3/uL — ABNORMAL HIGH (ref 4.0–10.5)
WBC: 10.1 10*3/uL (ref 4.0–10.5)
nRBC: 0 % (ref 0.0–0.2)
nRBC: 0 % (ref 0.0–0.2)

## 2022-07-23 LAB — BASIC METABOLIC PANEL
Anion gap: 10 (ref 5–15)
BUN: 8 mg/dL (ref 6–20)
CO2: 18 mmol/L — ABNORMAL LOW (ref 22–32)
Calcium: 4.7 mg/dL — CL (ref 8.9–10.3)
Chloride: 116 mmol/L — ABNORMAL HIGH (ref 98–111)
Creatinine, Ser: 0.95 mg/dL (ref 0.44–1.00)
GFR, Estimated: >60 mL/min (ref >60)
Glucose, Bld: 70 mg/dL (ref 70–99)
Potassium: 2.3 mmol/L — CL (ref 3.5–5.1)
Sodium: 144 mmol/L (ref 135–145)

## 2022-07-23 LAB — BRAIN NATRIURETIC PEPTIDE: B Natriuretic Peptide: 92.7 pg/mL (ref 0.0–100.0)

## 2022-07-23 LAB — COMPREHENSIVE METABOLIC PANEL
ALT: 26 U/L (ref 0–44)
ALT: 23 U/L (ref 0–44)
AST: 37 U/L (ref 15–41)
AST: 33 U/L (ref 15–41)
Albumin: 2.9 g/dL — ABNORMAL LOW (ref 3.5–5.0)
Albumin: 2.5 g/dL — ABNORMAL LOW (ref 3.5–5.0)
Alkaline Phosphatase: 135 U/L — ABNORMAL HIGH (ref 38–126)
Alkaline Phosphatase: 114 U/L (ref 38–126)
Anion gap: 10 (ref 5–15)
Anion gap: 9 (ref 5–15)
BUN: 13 mg/dL (ref 6–20)
BUN: 11 mg/dL (ref 6–20)
CO2: 28 mmol/L (ref 22–32)
CO2: 27 mmol/L (ref 22–32)
Calcium: 8.3 mg/dL — ABNORMAL LOW (ref 8.9–10.3)
Calcium: 8.2 mg/dL — ABNORMAL LOW (ref 8.9–10.3)
Chloride: 100 mmol/L (ref 98–111)
Chloride: 105 mmol/L (ref 98–111)
Creatinine, Ser: 1.61 mg/dL — ABNORMAL HIGH (ref 0.44–1.00)
Creatinine, Ser: 1.21 mg/dL — ABNORMAL HIGH (ref 0.44–1.00)
GFR, Estimated: 37 mL/min — ABNORMAL LOW (ref >60)
GFR, Estimated: 52 mL/min — ABNORMAL LOW (ref >60)
Glucose, Bld: 118 mg/dL — ABNORMAL HIGH (ref 70–99)
Glucose, Bld: 91 mg/dL (ref 70–99)
Potassium: 3.1 mmol/L — ABNORMAL LOW (ref 3.5–5.1)
Potassium: 3.2 mmol/L — ABNORMAL LOW (ref 3.5–5.1)
Sodium: 138 mmol/L (ref 135–145)
Sodium: 141 mmol/L (ref 135–145)
Total Bilirubin: 2.9 mg/dL — ABNORMAL HIGH (ref 0.3–1.2)
Total Bilirubin: 2.5 mg/dL — ABNORMAL HIGH (ref 0.3–1.2)
Total Protein: 6.9 g/dL (ref 6.5–8.1)
Total Protein: 6.2 g/dL — ABNORMAL LOW (ref 6.5–8.1)

## 2022-07-23 LAB — TROPONIN I (HIGH SENSITIVITY)
Troponin I (High Sensitivity): 19 ng/L — ABNORMAL HIGH (ref <18)
Troponin I (High Sensitivity): 19 ng/L — ABNORMAL HIGH (ref <18)
Troponin I (High Sensitivity): 48 ng/L — ABNORMAL HIGH (ref <18)
Troponin I (High Sensitivity): 80 ng/L — ABNORMAL HIGH (ref <18)

## 2022-07-23 LAB — ECHOCARDIOGRAM COMPLETE
AR max vel: 2.21 cm2
AV Area VTI: 2.02 cm2
AV Area mean vel: 2.12 cm2
AV Mean grad: 4 mmHg
AV Peak grad: 7.4 mmHg
Ao pk vel: 1.36 m/s
Area-P 1/2: 3.77 cm2
Calc EF: 59.1 %
Height: 68 in
MV M vel: 3.21 m/s
MV Peak grad: 41.2 mmHg
S' Lateral: 2.5 cm
Single Plane A2C EF: 66 %
Single Plane A4C EF: 55.7 %
Weight: 3200 oz

## 2022-07-23 LAB — HEPATIC FUNCTION PANEL
ALT: 10 U/L (ref 0–44)
AST: 16 U/L (ref 15–41)
Albumin: <1.5 g/dL — ABNORMAL LOW (ref 3.5–5.0)
Alkaline Phosphatase: 70 U/L (ref 38–126)
Bilirubin, Direct: 0.6 mg/dL — ABNORMAL HIGH (ref 0.0–0.2)
Indirect Bilirubin: 0.9 mg/dL (ref 0.3–0.9)
Total Bilirubin: 1.5 mg/dL — ABNORMAL HIGH (ref 0.3–1.2)
Total Protein: 3.6 g/dL — ABNORMAL LOW (ref 6.5–8.1)

## 2022-07-23 LAB — PROCALCITONIN: Procalcitonin: 26.39 ng/mL

## 2022-07-23 LAB — MRSA NEXT GEN BY PCR, NASAL: MRSA by PCR Next Gen: NOT DETECTED

## 2022-07-23 LAB — LACTIC ACID, PLASMA: Lactic Acid, Venous: 1.5 mmol/L (ref 0.5–1.9)

## 2022-07-23 LAB — TSH: TSH: 0.428 u[IU]/mL (ref 0.350–4.500)

## 2022-07-23 MED ORDER — MIDODRINE HCL 5 MG PO TABS
5.0000 mg | ORAL_TABLET | Freq: Three times a day (TID) | ORAL | Status: DC
  Administered 2022-07-23 – 2022-07-29 (×12): 5 mg via ORAL
  Filled 2022-07-23 (×18): qty 1

## 2022-07-23 MED ORDER — GUAIFENESIN-DM 100-10 MG/5ML PO SYRP
5.0000 mL | ORAL_SOLUTION | Freq: Four times a day (QID) | ORAL | Status: DC
  Administered 2022-07-23 – 2022-07-29 (×23): 5 mL via ORAL
  Filled 2022-07-23 (×4): qty 10
  Filled 2022-07-23 (×3): qty 5
  Filled 2022-07-23 (×15): qty 10

## 2022-07-23 MED ORDER — ALPRAZOLAM 0.5 MG PO TABS
1.0000 mg | ORAL_TABLET | Freq: Every day | ORAL | Status: DC
  Administered 2022-07-23 – 2022-07-28 (×6): 1 mg via ORAL
  Filled 2022-07-23 (×6): qty 2

## 2022-07-23 MED ORDER — EZETIMIBE 10 MG PO TABS
10.0000 mg | ORAL_TABLET | Freq: Every day | ORAL | Status: DC
  Administered 2022-07-23 – 2022-07-29 (×7): 10 mg via ORAL
  Filled 2022-07-23 (×7): qty 1

## 2022-07-23 MED ORDER — LACTATED RINGERS IV SOLN: INTRAVENOUS | Status: DC

## 2022-07-23 MED ORDER — VANCOMYCIN HCL 750 MG/150ML IV SOLN: 750.0000 mg | INTRAVENOUS | Status: DC

## 2022-07-23 MED ORDER — ENSURE ENLIVE PO LIQD
237.0000 mL | Freq: Three times a day (TID) | ORAL | Status: DC
  Administered 2022-07-23 – 2022-07-28 (×12): 237 mL via ORAL

## 2022-07-23 MED ORDER — OMEPRAZOLE MAGNESIUM 20 MG PO TBEC: 20.0000 mg | DELAYED_RELEASE_TABLET | Freq: Every day | ORAL | Status: DC

## 2022-07-23 MED ORDER — POTASSIUM CHLORIDE CRYS ER 20 MEQ PO TBCR
40.0000 meq | EXTENDED_RELEASE_TABLET | Freq: Once | ORAL | Status: AC
  Administered 2022-07-23: 40 meq via ORAL
  Filled 2022-07-23: qty 2

## 2022-07-23 MED ORDER — ACETAMINOPHEN 325 MG PO TABS
650.0000 mg | ORAL_TABLET | Freq: Four times a day (QID) | ORAL | Status: DC | PRN
  Administered 2022-07-23 – 2022-07-24 (×3): 650 mg via ORAL
  Filled 2022-07-23 (×4): qty 2

## 2022-07-23 MED ORDER — ALBUTEROL SULFATE (2.5 MG/3ML) 0.083% IN NEBU
2.5000 mg | INHALATION_SOLUTION | Freq: Four times a day (QID) | RESPIRATORY_TRACT | Status: DC
  Administered 2022-07-23 – 2022-07-24 (×3): 2.5 mg via RESPIRATORY_TRACT
  Filled 2022-07-23 (×3): qty 3

## 2022-07-23 MED ORDER — SODIUM CHLORIDE 0.9 % IV SOLN
2.0000 g | Freq: Two times a day (BID) | INTRAVENOUS | Status: DC
  Administered 2022-07-23 – 2022-07-24 (×2): 2 g via INTRAVENOUS
  Filled 2022-07-23 (×2): qty 12.5

## 2022-07-23 MED ORDER — SODIUM CHLORIDE 0.9 % IV SOLN
100.0000 mg | Freq: Two times a day (BID) | INTRAVENOUS | Status: AC
  Administered 2022-07-23 – 2022-07-27 (×10): 100 mg via INTRAVENOUS
  Filled 2022-07-23 (×10): qty 100

## 2022-07-23 MED ORDER — MOMETASONE FURO-FORMOTEROL FUM 200-5 MCG/ACT IN AERO
2.0000 | INHALATION_SPRAY | Freq: Two times a day (BID) | RESPIRATORY_TRACT | Status: DC
  Administered 2022-07-23 – 2022-07-27 (×9): 2 via RESPIRATORY_TRACT
  Filled 2022-07-23: qty 8.8

## 2022-07-23 MED ORDER — VITAMIN C 500 MG PO TABS
1000.0000 mg | ORAL_TABLET | Freq: Every day | ORAL | Status: DC
  Administered 2022-07-23 – 2022-07-29 (×7): 1000 mg via ORAL
  Filled 2022-07-23 (×7): qty 2

## 2022-07-23 MED ORDER — PHENOL 1.4 % MT LIQD: 1.0000 | OROMUCOSAL | Status: DC | PRN

## 2022-07-23 MED ORDER — CHLORHEXIDINE GLUCONATE CLOTH 2 % EX PADS
6.0000 | MEDICATED_PAD | Freq: Every day | CUTANEOUS | Status: DC
  Administered 2022-07-23 – 2022-07-29 (×8): 6 via TOPICAL

## 2022-07-23 MED ORDER — PANTOPRAZOLE SODIUM 40 MG PO TBEC
40.0000 mg | DELAYED_RELEASE_TABLET | Freq: Every day | ORAL | Status: DC
  Administered 2022-07-23 – 2022-07-29 (×7): 40 mg via ORAL
  Filled 2022-07-23 (×7): qty 1

## 2022-07-23 MED ORDER — PERFLUTREN LIPID MICROSPHERE
1.0000 mL | INTRAVENOUS | Status: AC | PRN
  Administered 2022-07-23: 2 mL via INTRAVENOUS

## 2022-07-23 MED ORDER — ASPIRIN 81 MG PO TBEC
81.0000 mg | DELAYED_RELEASE_TABLET | Freq: Every day | ORAL | Status: DC
  Administered 2022-07-23 – 2022-07-29 (×7): 81 mg via ORAL
  Filled 2022-07-23 (×7): qty 1

## 2022-07-23 MED ORDER — ACETAMINOPHEN 650 MG RE SUPP: 650.0000 mg | Freq: Four times a day (QID) | RECTAL | Status: DC | PRN

## 2022-07-23 MED ORDER — GUAIFENESIN-DM 100-10 MG/5ML PO SYRP
5.0000 mL | ORAL_SOLUTION | Freq: Four times a day (QID) | ORAL | Status: DC
  Filled 2022-07-23: qty 10

## 2022-07-23 MED ORDER — MENTHOL 3 MG MT LOZG
1.0000 | LOZENGE | OROMUCOSAL | Status: DC | PRN
  Administered 2022-07-23: 3 mg via ORAL
  Filled 2022-07-23: qty 9

## 2022-07-23 MED ORDER — AMITRIPTYLINE HCL 25 MG PO TABS
75.0000 mg | ORAL_TABLET | Freq: Every day | ORAL | Status: DC
  Administered 2022-07-23 – 2022-07-28 (×6): 75 mg via ORAL
  Filled 2022-07-23 (×7): qty 3

## 2022-07-23 MED ORDER — ENOXAPARIN SODIUM 40 MG/0.4ML IJ SOSY
40.0000 mg | PREFILLED_SYRINGE | INTRAMUSCULAR | Status: DC
  Administered 2022-07-23 – 2022-07-29 (×7): 40 mg via SUBCUTANEOUS
  Filled 2022-07-23 (×6): qty 0.4

## 2022-07-23 NOTE — Progress Notes (Signed)
Pharmacy Antibiotic Note  Brandy [name] is a 57 y.o. female admitted on 07/22/2022 with pneumonia. In the ED patient received Cefepime 2gm IV, Vancomycin 2gm IV and Flagyl 55m IV x 1 dose each.  Pharmacy has been consulted for Vancomycin and Cefepime dosing.  Plan: Cefepime 2gm IV q12h Vancomycin 750 mg IV Q 24 hrs. Goal AUC 400-550.  Expected AUC: 455.7  SCr used: 1.61 Doxycycline per MD Follow renal function F/u culture results and sensitivities  Height: 5' 8"$  (172.7 cm) Weight: 90.7 kg (200 lb) IBW/kg (Calculated) : 63.9  Temp (24hrs), Avg:100.1 F (37.8 C), Min:98.6 F (37 C), Max:101.5 F (38.6 C)  Recent Labs  Lab 07/22/22 2300  WBC 11.0*  CREATININE 1.61*  LATICACIDVEN 1.0    Estimated Creatinine Clearance: 44.9 mL/min (A) (by C-G formula based on SCr of 1.61 mg/dL (H)).    Allergies  Allergen Reactions   Zithromax [Azithromycin] Shortness Of Breath and Itching    TOTAL BODY ITCHING [EVEN SOLES OF FEET] WHEEZING    Tramadol Itching and Other (See Comments)    Has taken recently without any side effects.   Psyllium Nausea And Vomiting and Other (See Comments)    Metamucil. Sneezing      Antimicrobials this admission: 2/17 Metronidazole x 1 2/17 Vancomycin >>   2/17 Cefepime >>   2/17 Doxycycline  Dose adjustments this admission:    Microbiology results: 2/16 BCx:    2/17 MRSA PCR:    Thank you for allowing pharmacy to be a part of this patient's care.  PEverette Rank PharmD 07/23/2022 2:58 AM

## 2022-07-23 NOTE — Progress Notes (Signed)
[facility] Progress Note  Patient: Brandy [name] [facility]     [id number]  DOA: 07/22/2022   PCP: Sandrea Hughs, NP       Brief hospital course: [name], a 57 year old female with a complex medical history including chronic respiratory failure necessitating 2 L of oxygen, ASD repair in 2006, metastatic breast cancer managed by Dr. Irene Limbo, GERD, anxiety, chronic kidney disease stage III, and chronic hypotension, presented to the emergency department due to a persistent cough productive of sputum, weakness, and dizziness. Approximately one week ago, she received a diagnosis of COVID-19 during an episode of shortness of breath and diarrhea. Despite being prescribed Molnupiravir, she was unable to afford the medication and did not take it. In the past 24 hours, her respiratory symptoms intensified, accompanied by increased weakness and dizziness, prompting her to seek emergency care. She also experienced several episodes of watery diarrhea. Additionally, the patient reported chronic congestion in her left eye.   Upon evaluation in the emergency department, she was found to have a fever with a temperature of 101.67F. Chest x-ray> infiltrates in the right middle and lower lobes indicative of pneumonia. She remained stable on her baseline oxygen requirement of 2 L.  Temp 101. 5, RR > 20, HR > 100 WBC>  11 T Bili 2.9  high-sensitive troponin levels > 80, subsequently decreasing to 48.  Started on empiric antibiotics. The COVID-19 test returned positive.   Subjective:  She has shortness of breath and a cough. Diarrhea resolved for now.   Assessment and Plan:  Note: labs from 5:30 were inaccurate and had to be redrawn  Principal Problem:   Postviral Pneumonia, recent COVID-19 virus infection -   sepsis   Chronic respiratory failure with hypoxia - procalcitonin 26.39 - congested cough and dyspnea - cont Maxipime and Doxy - add albuterol inhaler and cough  meds today - cont Dulera  Active Problems: Elevated T bili - due to sepsis - now 2.5  Left conjunctiva injection and edema - likely related to COVID- no pain and no discharged  Hypokalemia - K 3.2- replace and follow  CKD 3b - Cr baseline 1.2- 1.6 - at baseline currently    Anxiety disorder - Xanax QHS    Breast cancer, metastatic   - on immunotherapy - Dr Irene Limbo  Chronic hypotension - cont Midodrine    GERD (gastroesophageal reflux disease) - pantoprozole   ASD repair Right eye enucleated          Code Status: Full Code Consultants: none Level of Care: Level of care: Stepdown Total time on patient care: 35 DVT prophylaxis:  enoxaparin (LOVENOX) injection 40 mg Start: 07/23/22 1000     Objective:   Vitals:   07/23/22 0400 07/23/22 0500 07/23/22 0600 07/23/22 0745  BP: 126/72 (!) 107/52 (!) 141/71   Pulse: 89 79 83 86  Resp: (!) 22 19 (!) 23 20  Temp: 99 F (37.2 C)     TempSrc: Oral     SpO2: 98% 99% 100% 98%  Weight:      Height:       Filed Weights   07/22/22 2057  Weight: 90.7 kg   Exam: General exam: Appears uncomfortable  HEENT: oral mucosa moist- left conjunctiva is pink and edematous- no discharge- right eye enucleated Respiratory system: very congested cough with dyspnea- RR in high 20s Cardiovascular system: S1 & S2 heard - tachycardic Gastrointestinal system: Abdomen soft, non-tender, nondistended. Normal bowel sounds   Extremities: No cyanosis, clubbing  or edema Psychiatry:  Mood & affect appropriate.      CBC: Recent Labs  Lab 07/22/22 2300  WBC 11.0*  NEUTROABS 8.4*  HGB 11.6*  HCT 35.0*  MCV 107.7*  PLT [id number]   Basic Metabolic Panel: Recent Labs  Lab 07/22/22 2300 07/23/22 0530  NA 138 144  K 3.1* 2.3*  CL 100 116*  CO2 28 18*  GLUCOSE 118* 70  BUN 13 8  CREATININE 1.61* 0.95  CALCIUM 8.3* 4.7*   GFR: Estimated Creatinine Clearance: 76 mL/min (by C-G formula based on SCr of 0.95 mg/dL).  Scheduled Meds:   ALPRAZolam  1 mg Oral QHS   amitriptyline  75 mg Oral QHS   vitamin C  1,000 mg Oral Daily   aspirin EC  81 mg Oral Daily   Chlorhexidine Gluconate Cloth  6 each Topical Daily   enoxaparin (LOVENOX) injection  40 mg Subcutaneous Q24H   ezetimibe  10 mg Oral Daily   midodrine  5 mg Oral TID WC   mometasone-formoterol  2 puff Inhalation BID   pantoprazole  40 mg Oral Daily   Continuous Infusions:  ceFEPime (MAXIPIME) IV     doxycycline (VIBRAMYCIN) IV Stopped (07/23/22 0550)   lactated ringers 150 mL/hr at 07/23/22 0745   vancomycin     Imaging and lab data was personally reviewed DG Chest Portable 1 View  Result Date: 07/22/2022 CLINICAL DATA:  Shortness of breath.  Positive COVID-19. EXAM: PORTABLE CHEST 1 VIEW COMPARISON:  Chest radiograph dated 04/24/2022. FINDINGS: Right mid to lower lung field opacity most consistent with pneumonia. Clinical correlation and follow-up to resolution recommended. The left lung is clear. No pleural effusion or pneumothorax. Cardiac Amplatzer occlusive device noted. No acute osseous pathology. IMPRESSION: Right mid to lower lung field pneumonia. Electronically Signed   By: Anner Crete M.D.   On: 07/22/2022 23:00    LOS: 0 days   Author: Debbe Odea  07/23/2022 7:55 AM  To contact [facility]>   Check the care team in [facility] and look for the attending/consulting [place] provider listed  Log into [email or web address] and use [facility]'s universal password   Go to> "[facility]"  and find provider  If you still have difficulty reaching the provider, please page the [facility] (Director on Call) for the Hospitalists listed on amion

## 2022-07-23 NOTE — H&P (Signed)
History and Physical    Brandy [name] [facility] [id number] DOB: 1965-10-18 DOA: 07/22/2022  Patient coming from: Home.  Chief Complaint: Shortness of breath.  HPI: Brandy [name] is a 57 y.o. female with history of chronic respiratory failure with hypoxia on 2 L oxygen, ASD s/p repair in 2006, metastatic breast cancer being followed by Dr. Irene [name], GERD, anxiety, chronic kidney disease stage III, chronic hypotension on midodrine presented to the ER after patient has been having persistent cough productive of sputum with weakness and dizziness.  Patient states about a week ago she was diagnosed with COVID infection when she had shortness of breath and diarrhea.  She was given a prescription of Molnupiravir but patient did not take it because she was not able to afford the medication.  In the last 24 hours patient's shortness of breath worsened with productive sputum and also started feeling dizzy weak and decided to come to the ER.  She did have a couple episodes of watery diarrhea.  ED Course: In the ER patient was febrile with temperature 101.5 F chest x-ray shows right middle lobe and lower lobe infiltrates concerning for pneumonia.  Patient is on 2 L oxygen which is the baseline for her.  Labs show WBC count of 11 high sensitive troponin was 80 and repeat was 48.  EKG shows normal sinus rhythm.  Patient did not complain of any chest pain.  Patient at this time was diagnosed with possible postviral pneumonia and started on empiric antibiotics.  Patient's COVID test came back positive.  Patient's left eye looks congested and patient states is chronic.  Review of Systems: As per HPI, rest all negative.   Past Medical History:  Diagnosis Date   Acute pansinusitis 08/02/2017   Anxiety    Arthritis    ASD (atrial septal defect)    s/p closure with Amplatzer device 10/05/04 (Dr. Myriam Jacobson, [facility]) 10/05/04   Cancer [facility])    Cataract    Chronic pain    CKD (chronic kidney  disease)    Dyspnea    Fatty liver 09/04/2019   GERD (gastroesophageal reflux disease)    Heart murmur    no longer heard   History of hiatal hernia    Hyperlipidemia    Legally blind in right eye, as defined in [place]    Lumbar herniated disc    Migraines    On home oxygen therapy 06/15/2022   Pt was given O2 on D/C from [facility] 04/2022   OSA on CPAP 06/15/2022   PONV (postoperative nausea and vomiting)    Sciatica     Past Surgical History:  Procedure Laterality Date   ABLATION     BREAST LUMPECTOMY WITH RADIOACTIVE SEED AND SENTINEL LYMPH NODE BIOPSY Right 11/23/2020   Procedure: RIGHT BREAST LUMPECTOMY WITH RADIOACTIVE SEED AND RIGHT AXILLARY SENTINEL LYMPH NODE BIOPSY;  Surgeon: Rolm Bookbinder, MD;  Location: [place];  Service: General;  Laterality: Right;   BREAST SURGERY Bilateral 2011   Breast Reduction Surgery   BUNIONECTOMY     CARDIAC CATHETERIZATION     10/05/04 [facility]): LM < 25%, otherwise normal coronaries. No pulmonary HTN, Mildly enlarged RV. Secundum ASD s/p closure.   CARDIAC SURGERY     CATARACT EXTRACTION     CLEFT PALATE REPAIR     s/p cleft lip and palate repair   EYE SURGERY Right 2019   right eye removed   LUMBAR LAMINECTOMY/DECOMPRESSION MICRODISCECTOMY Left 05/23/2016   Procedure: LEFT L4-L5 LATERAL RECESS DECOMPRESSION  WITH CENTRAL AND RIGHT DECOMPRESSION VIA LEFT SIDE;  Surgeon: [name], MD;  Location: [place];  Service: Orthopedics;  Laterality: Left;   LUMBAR LAMINECTOMY/DECOMPRESSION MICRODISCECTOMY Left 05/23/2016   Procedure: LUMBAR LAMINECTOMY/DECOMPRESSION MICRODISCECTOMY Lumbar five - Sacral One 1 LEVEL;  Surgeon: [name], MD;  Location: [place];  Service: Orthopedics;  Laterality: Left;   REDUCTION MAMMAPLASTY  2011   SHOULDER INJECTION Left 05/23/2016   Procedure: SHOULDER INJECTION;  Surgeon: [name], MD;  Location: [place];  Service: Orthopedics;  Laterality: Left;  band-aid per pa-c   TRANSTHORACIC ECHOCARDIOGRAM     12/15/05  [facility]): Mild LVH, EF > [id number], grade 1 diastolic dysfunction, Trivial MR/PR/TR.   TUBAL LIGATION       reports that she quit smoking about 4 years ago. Her smoking use included cigarettes. She has a 6.25 pack-year smoking history. She has never used smokeless tobacco. She reports that she does not drink alcohol and does not use drugs.  Allergies  Allergen Reactions   Zithromax [Azithromycin] Shortness Of Breath and Itching    TOTAL BODY ITCHING [EVEN SOLES OF FEET] WHEEZING    Tramadol Itching and Other (See Comments)    Has taken recently without any side effects.   Psyllium Nausea And Vomiting and Other (See Comments)    Metamucil. Sneezing      Family History  Problem Relation Age of Onset   Arthritis Mother    Hypertension Mother    Diabetes Mother    High blood pressure Mother    Cancer Father        Lung   Colon cancer Neg Hx    Colon polyps Neg Hx    Esophageal cancer Neg Hx    Rectal cancer Neg Hx    Stomach cancer Neg Hx     Prior to Admission medications   Medication Sig Start Date End Date Taking? Authorizing Provider  abemaciclib (VERZENIO) 100 MG tablet Take 1 tablet (100 mg total) by mouth 2 (two) times daily. 05/17/22   Brunetta Genera, MD  acetaminophen (TYLENOL) 500 MG tablet Take 500 mg by mouth daily as needed for moderate pain.    [provider]  albuterol (PROVENTIL) (2.5 MG/3ML) 0.083% nebulizer solution Take 3 mLs (2.5 mg total) by nebulization every 6 (six) hours as needed for wheezing or shortness of breath. 06/16/22   Margaretha Seeds, MD  albuterol (VENTOLIN HFA) 108 (90 Base) MCG/ACT inhaler Inhale 2 puffs into the lungs every 6 (six) hours as needed for wheezing or shortness of breath. 06/16/22   Margaretha Seeds, MD  ALPRAZolam [name]) 1 MG tablet Take 1 tablet (1 mg total) by mouth at bedtime. 06/09/22   Medina-Vargas, Monina C, NP  amitriptyline (ELAVIL) 75 MG tablet TAKE 1 TABLET BY MOUTH AT BEDTIME 06/07/22   Ngetich, Dinah C, NP   Carboxymethylcellul-Glycerin (LUBRICATING EYE DROPS OP) Place 1 drop into the right eye in the morning, at noon, and at bedtime.    [provider]  diphenoxylate-atropine (LOMOTIL) 2.5-0.025 MG tablet Take 1 tablet by mouth 3 (three) times daily as needed for diarrhea or loose stools. 04/20/21   Brunetta Genera, MD  ezetimibe (ZETIA) 10 MG tablet Take 1 tablet (10 mg total) by mouth daily. 06/16/22   Ngetich, Dinah C, NP  fluticasone-salmeterol (ADVAIR) 250-50 MCG/ACT AEPB INHALE 1 INHALATION BY MOUTH IN  THE MORNING AND AT BEDTIME 06/28/22   Margaretha Seeds, MD  loperamide (IMODIUM) 2 MG capsule Take 1-2  capsules (2-4 mg total) by mouth 4 (four) times daily as needed for diarrhea or loose stools. Patient taking differently: Take 4 mg by mouth as needed for diarrhea or loose stools. 05/10/21   Brunetta Genera, MD  methocarbamol (ROBAXIN) 500 MG tablet Take 1 tablet (500 mg total) by mouth every 6 (six) hours as needed for muscle spasms. Patient taking differently: Take 500 mg by mouth 3 (three) times daily as needed for muscle spasms. 01/10/22   [name], MD  midodrine (PROAMATINE) 5 MG tablet Take 1 tablet (5 mg total) by mouth 3 (three) times daily with meals. 06/09/22   Medina-Vargas, Monina C, NP  Multiple Vitamin (MULTIVITAMIN WITH MINERALS) TABS tablet Take 1 tablet by mouth daily.    [provider]  omeprazole (PRILOSEC OTC) 20 MG tablet Take 20 mg by mouth daily.    [provider]  potassium chloride SA (KLOR-CON M) 20 MEQ tablet Take 1 tablet (20 mEq total) by mouth 2 (two) times daily. For 14 days 05/24/22   Orson Slick, MD  traMADol (ULTRAM-ER) 100 MG 24 hr tablet Take 1 tablet (100 mg total) by mouth daily. 06/07/22   Gillis Santa, MD  triamcinolone cream (KENALOG) 0.1 % Apply 1 Application topically 2 (two) times daily. 06/09/22   Medina-Vargas, Monina C, NP  Vitamin D, Ergocalciferol, (DRISDOL) 1.25 MG (50000 UNIT) CAPS capsule TAKE 1 CAPSULE  ONCE PER WEEK 06/09/22   Medina-Vargas, Monina C, NP  White Petrolatum-Mineral Oil (LUBRICANT EYE NIGHTTIME) OINT Place 1 drop into the left eye at bedtime as needed (dryness).    [provider]  XIIDRA 5 % SOLN Place 1 drop into the left eye in the morning, at noon, and at bedtime. 03/03/22   [provider]    Physical Exam: Constitutional: Moderately built and nourished. Vitals:   07/22/22 2043 07/22/22 2057 07/22/22 2230 07/22/22 2250  BP:  (!) 105/58 95/61   Pulse:  (!) 121 (!) 109   Resp:  20 (!) 27   Temp:  98.6 F (37 C)  (!) 101.5 F (38.6 C)  TempSrc:  Oral  Rectal  SpO2: 94% 95% 90%   Weight:  90.7 kg    Height:  5' 8"$  (1.727 m)     Eyes: Anicteric no pallor.  Right eyes prosthetic.  Left eye looks congested. ENMT: No discharge from the ears eyes nose and mouth. Neck: No mass felt.  No neck rigidity. Respiratory: No rhonchi or crepitations. Cardiovascular: S1-S2 heard. Abdomen: Soft nontender bowel sound present. Musculoskeletal: No edema. Skin: No rash. Neurologic: Alert awake oriented to time place and person.  Moves all extremities. Psychiatric: Appears normal.  Normal affect.   Labs on Admission: I have personally reviewed following labs and imaging studies  CBC: Recent Labs  Lab 07/22/22 2300  WBC 11.0*  NEUTROABS 8.4*  HGB 11.6*  HCT 35.0*  MCV 107.7*  PLT [id number]   Basic Metabolic Panel: Recent Labs  Lab 07/22/22 2300  NA 138  K 3.1*  CL 100  CO2 28  GLUCOSE 118*  BUN 13  CREATININE 1.61*  CALCIUM 8.3*   GFR: Estimated Creatinine Clearance: 44.9 mL/min (A) (by C-G formula based on SCr of 1.61 mg/dL (H)). Liver Function Tests: Recent Labs  Lab 07/22/22 2300  AST 37  ALT 26  ALKPHOS 135*  BILITOT 2.9*  PROT 6.9  ALBUMIN 2.9*   No results for input(s): "LIPASE", "AMYLASE" in the last 168 hours. No results for input(s): "AMMONIA"  in the last 168 hours. Coagulation Profile: No results for input(s): "INR", "PROTIME" in  the last 168 hours. Cardiac Enzymes: No results for input(s): "CKTOTAL", "CKMB", "CKMBINDEX", "TROPONINI" in the last 168 hours. BNP (last 3 results) No results for input(s): "PROBNP" in the last 8760 hours. HbA1C: No results for input(s): "HGBA1C" in the last 72 hours. CBG: No results for input(s): "GLUCAP" in the last 168 hours. Lipid Profile: No results for input(s): "CHOL", "HDL", "LDLCALC", "TRIG", "CHOLHDL", "LDLDIRECT" in the last 72 hours. Thyroid Function Tests: No results for input(s): "TSH", "T4TOTAL", "FREET4", "T3FREE", "THYROIDAB" in the last 72 hours. Anemia Panel: No results for input(s): "VITAMINB12", "FOLATE", "FERRITIN", "TIBC", "IRON", "RETICCTPCT" in the last 72 hours. Urine analysis:    Component Value Date/Time   COLORURINE YELLOW 09/04/2019 0953   APPEARANCEUR CLEAR 09/04/2019 0953   LABSPEC 1.041 (H) 09/04/2019 0953   PHURINE 5.0 09/04/2019 0953   GLUCOSEU NEGATIVE 09/04/2019 0953   HGBUR MODERATE (A) 09/04/2019 0953   BILIRUBINUR NEGATIVE 09/04/2019 0953   KETONESUR NEGATIVE 09/04/2019 0953   PROTEINUR NEGATIVE 09/04/2019 0953   UROBILINOGEN 1.0 02/22/2015 2105   NITRITE NEGATIVE 09/04/2019 0953   LEUKOCYTESUR SMALL (A) 09/04/2019 0953   Sepsis Labs: @LABRCNTIP$ (procalcitonin:4,lacticidven:4) )No results found for this or any previous visit (from the past 240 hour(s)).   Radiological Exams on Admission: DG Chest Portable 1 View  Result Date: 07/22/2022 CLINICAL DATA:  Shortness of breath.  Positive COVID-19. EXAM: PORTABLE CHEST 1 VIEW COMPARISON:  Chest radiograph dated 04/24/2022. FINDINGS: Right mid to lower lung field opacity most consistent with pneumonia. Clinical correlation and follow-up to resolution recommended. The left lung is clear. No pleural effusion or pneumothorax. Cardiac Amplatzer occlusive device noted. No acute osseous pathology. IMPRESSION: Right mid to lower lung field pneumonia. Electronically Signed   By: Anner Crete M.D.    On: 07/22/2022 23:00    EKG: Independently reviewed.  Normal sinus rhythm.  Assessment/Plan Principal Problem:   Pneumonia Active Problems:   Breast cancer ([place])   CKD (chronic kidney disease), stage III (HCC)   Secundum ASD   Chronic respiratory failure with hypoxia (HCC)   Sepsis ([place])    Pneumonia with COVID-19 infection positive concerning for possible developing sepsis -    patient's symptoms have been ongoing for almost a week now.  Patient is on 2 L oxygen which is her baseline.  At this time patient is past 5 days to be on Paxlovid.  And not more than baseline hypoxia to be on steroids.  Will treat this as postviral bacterial pneumonia with vancomycin cefepime and doxycycline.  Follow cultures.  Follow inflammatory markers procalcitonin.  Will admit to stepdown. Elevated troponin could be secondary to COVID infection and pneumonia.  Denies any chest pain.  Will trend cardiac markers and D-dimer.  Check 2D echo.  Keep patient on aspirin.  Continue Zetia. History of breast cancer being followed by Dr. Irene [name].  Please notify oncologist in the morning to see if patient needs to be continued on the [facility] while inpatient. Chronic hypotension on midodrine. History of ASD status post surgery. Macrocytic anemia followed by oncologist. GERD on PPI. Anxiety on Xanax. Chronic respiratory failure with hypoxia of uncertain etiology presently being followed by pulmonologist takes Advair Diskus and as needed albuterol which will be continued.  Since patient has pneumonia with concerning features for developing sepsis will need close monitoring and more than 2 midnight stay inpatient status.    DVT prophylaxis: Lovenox. Code Status: Full code. Family Communication: Patient's  husband. Disposition Plan: Admit to stepdown. Consults called: None. Admission status: Inpatient.

## 2022-07-23 NOTE — Progress Notes (Signed)
  Echocardiogram 2D Echocardiogram has been performed.  Brandy [name] 07/23/2022, 8:48 AM

## 2022-07-23 NOTE — ED Provider Notes (Incomplete)
[place] EMERGENCY DEPARTMENT AT [facility] Provider Note   CSN: [id number] Arrival date & time: 07/22/22  2036     History {Add pertinent medical, surgical, social history, OB history to HPI:1} Chief Complaint  Patient presents with  . Shortness of Breath    Brandy [name] is a 57 y.o. female.  The history is provided by the patient and medical records. No language interpreter was used.  Shortness of Breath Severity:  Severe Onset quality:  Gradual Duration:  3 days Timing:  Constant Progression:  Worsening Context: URI   Relieved by:  Nothing Worsened by:  Nothing Ineffective treatments:  None tried Associated symptoms: cough and fever   Associated symptoms: no abdominal pain, no chest pain, no headaches, no neck pain, no rash, no vomiting and no wheezing   Risk factors: no hx of PE/DVT        Home Medications Prior to Admission medications   Medication Sig Start Date End Date Taking? Authorizing Provider  abemaciclib (VERZENIO) 100 MG tablet Take 1 tablet (100 mg total) by mouth 2 (two) times daily. 05/17/22   Brunetta Genera, MD  acetaminophen (TYLENOL) 500 MG tablet Take 500 mg by mouth daily as needed for moderate pain.    [provider]  albuterol (PROVENTIL) (2.5 MG/3ML) 0.083% nebulizer solution Take 3 mLs (2.5 mg total) by nebulization every 6 (six) hours as needed for wheezing or shortness of breath. 06/16/22   Margaretha Seeds, MD  albuterol (VENTOLIN HFA) 108 (90 Base) MCG/ACT inhaler Inhale 2 puffs into the lungs every 6 (six) hours as needed for wheezing or shortness of breath. 06/16/22   Margaretha Seeds, MD  ALPRAZolam [name]) 1 MG tablet Take 1 tablet (1 mg total) by mouth at bedtime. 06/09/22   Medina-Vargas, Monina C, NP  amitriptyline (ELAVIL) 75 MG tablet TAKE 1 TABLET BY MOUTH AT BEDTIME 06/07/22   Ngetich, Dinah C, NP  Ascorbic Acid (VITAMIN C) 1000 MG tablet Take 1 tablet (1,000 mg total) by mouth daily for 7  days. 07/15/22 07/22/22  Medina-Vargas, Monina C, NP  Carboxymethylcellul-Glycerin (LUBRICATING EYE DROPS OP) Place 1 drop into the right eye in the morning, at noon, and at bedtime.    [provider]  diphenoxylate-atropine (LOMOTIL) 2.5-0.025 MG tablet Take 1 tablet by mouth 3 (three) times daily as needed for diarrhea or loose stools. 04/20/21   Brunetta Genera, MD  ezetimibe (ZETIA) 10 MG tablet Take 1 tablet (10 mg total) by mouth daily. 06/16/22   Ngetich, Dinah C, NP  fluticasone-salmeterol (ADVAIR) 250-50 MCG/ACT AEPB INHALE 1 INHALATION BY MOUTH IN  THE MORNING AND AT BEDTIME 06/28/22   Margaretha Seeds, MD  loperamide (IMODIUM) 2 MG capsule Take 1-2 capsules (2-4 mg total) by mouth 4 (four) times daily as needed for diarrhea or loose stools. Patient taking differently: Take 4 mg by mouth as needed for diarrhea or loose stools. 05/10/21   Brunetta Genera, MD  methocarbamol (ROBAXIN) 500 MG tablet Take 1 tablet (500 mg total) by mouth every 6 (six) hours as needed for muscle spasms. Patient taking differently: Take 500 mg by mouth 3 (three) times daily as needed for muscle spasms. 01/10/22   [name], MD  midodrine (PROAMATINE) 5 MG tablet Take 1 tablet (5 mg total) by mouth 3 (three) times daily with meals. 06/09/22   Medina-Vargas, Monina C, NP  Multiple Vitamin (MULTIVITAMIN WITH MINERALS) TABS tablet Take 1 tablet by mouth daily.  [provider]  omeprazole (PRILOSEC OTC) 20 MG tablet Take 20 mg by mouth daily.    [provider]  potassium chloride SA (KLOR-CON M) 20 MEQ tablet Take 1 tablet (20 mEq total) by mouth 2 (two) times daily. For 14 days 05/24/22   Orson Slick, MD  traMADol (ULTRAM-ER) 100 MG 24 hr tablet Take 1 tablet (100 mg total) by mouth daily. 06/07/22   Gillis Santa, MD  triamcinolone cream (KENALOG) 0.1 % Apply 1 Application topically 2 (two) times daily. 06/09/22   Medina-Vargas, Monina C, NP  Vitamin D, Ergocalciferol, (DRISDOL)  1.25 MG (50000 UNIT) CAPS capsule TAKE 1 CAPSULE ONCE PER WEEK 06/09/22   Medina-Vargas, Monina C, NP  White Petrolatum-Mineral Oil (LUBRICANT EYE NIGHTTIME) OINT Place 1 drop into the left eye at bedtime as needed (dryness).    [provider]  XIIDRA 5 % SOLN Place 1 drop into the left eye in the morning, at noon, and at bedtime. 03/03/22   [provider]  zinc gluconate 50 MG tablet Take 1 tablet (50 mg total) by mouth daily for 7 days. 07/15/22 07/22/22  Medina-Vargas, Monina C, NP      Allergies    Zithromax [azithromycin], Tramadol, and Psyllium    Review of Systems   Review of Systems  Constitutional:  Positive for chills, fatigue and fever.  HENT:  Positive for congestion.   Respiratory:  Positive for cough and shortness of breath. Negative for chest tightness and wheezing.   Cardiovascular:  Negative for chest pain, palpitations and leg swelling.  Gastrointestinal:  Positive for diarrhea and nausea. Negative for abdominal pain, constipation and vomiting.  Genitourinary:  Negative for dysuria, flank pain and frequency.  Musculoskeletal:  Negative for back pain, neck pain and neck stiffness.  Skin:  Negative for rash and wound.  Neurological:  Negative for light-headedness and headaches.  Psychiatric/Behavioral:  Negative for agitation and confusion.   All other systems reviewed and are negative.   Physical Exam Updated Vital Signs BP (!) 105/58 (BP Location: Left Arm)   Pulse (!) 121   Temp 98.6 F (37 C) (Oral)   Resp 20   Ht 5' 8"$  (1.727 m)   Wt 90.7 kg   SpO2 95%   BMI 30.41 kg/m  Physical Exam Vitals and nursing note reviewed.  Constitutional:      General: She is not in acute distress.    Appearance: She is well-developed. She is not ill-appearing, toxic-appearing or diaphoretic.  HENT:     Head: Normocephalic and atraumatic.  Eyes:     Conjunctiva/sclera: Conjunctivae normal.     Pupils: Pupils are equal, round, and reactive to light.   Cardiovascular:     Rate and Rhythm: Regular rhythm. Tachycardia present.     Heart sounds: No murmur heard. Pulmonary:     Effort: Pulmonary effort is normal. Tachypnea present. No respiratory distress.     Breath sounds: Rhonchi present. No wheezing or rales.  Chest:     Chest wall: No tenderness.  Abdominal:     Palpations: Abdomen is soft.     Tenderness: There is no abdominal tenderness.  Musculoskeletal:        General: No swelling.     Cervical back: Neck supple.  Skin:    General: Skin is warm and dry.     Capillary Refill: Capillary refill takes less than 2 seconds.     Findings: No erythema.  Neurological:     General: No focal deficit  present.     Mental Status: She is alert.  Psychiatric:        Mood and Affect: Mood normal.     ED Results / Procedures / Treatments   Labs (all labs ordered are listed, but only abnormal results are displayed) Labs Reviewed  CBC WITH DIFFERENTIAL/PLATELET - Abnormal; Notable for the following components:      Result Value   WBC 11.0 (*)    RBC 3.25 (*)    Hemoglobin 11.6 (*)    HCT 35.0 (*)    MCV 107.7 (*)    MCH 35.7 (*)    All other components within normal limits  COMPREHENSIVE METABOLIC PANEL - Abnormal; Notable for the following components:   Potassium 3.1 (*)    Glucose, Bld 118 (*)    Creatinine, Ser 1.61 (*)    Calcium 8.3 (*)    Albumin 2.9 (*)    Alkaline Phosphatase 135 (*)    Total Bilirubin 2.9 (*)    GFR, Estimated 37 (*)    All other components within normal limits  TROPONIN I (HIGH SENSITIVITY) - Abnormal; Notable for the following components:   Troponin I (High Sensitivity) 80 (*)    All other components within normal limits  CULTURE, BLOOD (SINGLE)  LACTIC ACID, PLASMA  BRAIN NATRIURETIC PEPTIDE  LACTIC ACID, PLASMA  TSH  TROPONIN I (HIGH SENSITIVITY)    EKG None  Radiology DG Chest Portable 1 View  Result Date: 07/22/2022 CLINICAL DATA:  Shortness of breath.  Positive COVID-19. EXAM:  PORTABLE CHEST 1 VIEW COMPARISON:  Chest radiograph dated 04/24/2022. FINDINGS: Right mid to lower lung field opacity most consistent with pneumonia. Clinical correlation and follow-up to resolution recommended. The left lung is clear. No pleural effusion or pneumothorax. Cardiac Amplatzer occlusive device noted. No acute osseous pathology. IMPRESSION: Right mid to lower lung field pneumonia. Electronically Signed   By: Anner Crete M.D.   On: 07/22/2022 23:00    Procedures Procedures  {Document cardiac monitor, telemetry assessment procedure when appropriate:1}  Medications Ordered in ED Medications  lactated ringers infusion (has no administration in time range)  lactated ringers bolus 1,000 mL (1,000 mLs Intravenous New Bag/Given 07/22/22 2357)    And  lactated ringers bolus 1,000 mL (has no administration in time range)    And  lactated ringers bolus 1,000 mL (has no administration in time range)  ceFEPIme (MAXIPIME) 2 g in sodium chloride 0.9 % 100 mL IVPB (has no administration in time range)  metroNIDAZOLE (FLAGYL) IVPB 500 mg (has no administration in time range)  vancomycin (VANCOREADY) IVPB 2000 mg/400 mL (has no administration in time range)    ED Course/ Medical Decision Making/ A&P   {   Click here for ABCD2, HEART and other calculatorsREFRESH Note before signing :1}                          Medical Decision Making Amount and/or Complexity of Data Reviewed Labs: ordered. Radiology: ordered.  Risk Prescription drug management. Decision regarding hospitalization.   Brandy [name] is a 56 y.o. female with a past medical history significant for migraines, GERD, hypertension, hyperlipidemia, CKD, chronic 2 L oxygen use from chronic respiratory failure, and sleep apnea normally on CPAP and recently diagnosed with COVID-19 presents for worsening breathing, cough, fatigue, chills, nausea, and diarrhea.  Patient is coming by family who reports that her breathing  has aggressively worsened over the last 48 hours despite the medications  she has been taking.  She was given molnupiravir 7 days ago as well as vitamin C, and zinc gluconate.  Family reports that her breathing has worsened and she feels like she is feeling worse overall.  On my exam, lungs have coarseness but she was not wheezing.  Chest was nontender.  Abdomen nontender.  She is on her home oxygen but was tachycardic and tachypneic.  She was afebrile initially and was not hypotensive.  Abdomen nontender.  No significant edema seen.  Patient otherwise resting but uncomfortable.  Clinically I am concerned about possible development of post COVID-pneumonia or just worsening COVID overall.  Given her lack of chest pain or palpitations have less suspicion for a thromboembolic etiology at this time.  Will get chest x-ray, labs, and cardiac workup.  X-ray returned showing evidence of pneumonia.  She does have a new leukocytosis with a white count of 11.  Still waiting on the rest of her metabolic panel and labs but will go reassess patient to tell her about the pneumonia.  11:41 PM On reassessment, patient is now borderline hypotensive.  Blood pressure was in the 80s when I first saw her and is now in the 90s.  She is now febrile at 101.5, tachycardic, tachypneic, with downtrending blood pressures.  Will make a code sepsis as her x-ray now shows pneumonia.  Will give fluids, broad-spectrum antibiotics, and call for admission when her labs are completed.  12:00 AM Once her metabolic panel has returned, will please call for admission.  She will receive fluids and antibiotics and blood pressure last I saw was over [id number] systolic, do not think she will need ICU at this time.  Family agrees with this plan and she will be admitted.   {Document critical care time when appropriate:1} {Document review of labs and clinical decision tools ie heart score, Chads2Vasc2 etc:1}  {Document your independent review of  radiology images, and any outside records:1} {Document your discussion with family members, caretakers, and with consultants:1} {Document social determinants of health affecting pt's care:1} {Document your decision making why or why not admission, treatments were needed:1} Final Clinical Impression(s) / ED Diagnoses Final diagnoses:  Pneumonia of right middle lobe due to infectious organism  COVID-19  Sepsis, due to unspecified organism, unspecified whether acute organ dysfunction present [facility])     Clinical Impression: 1. Pneumonia of right middle lobe due to infectious organism   2. COVID-19   3. Sepsis, due to unspecified organism, unspecified whether acute organ dysfunction present [facility])     Disposition: Admit  This note was prepared with assistance of Dragon voice recognition software. Occasional wrong-word or sound-a-like substitutions may have occurred due to the inherent limitations of voice recognition software.

## 2022-07-23 NOTE — ED Notes (Signed)
ED TO INPATIENT HANDOFF REPORT  ED Nurse Name and Phone #: Sylvan Cheese Name/Age/Gender Brandy [name] 57 y.o. female Room/Bed: WA16/WA16  Code Status   Code Status: Prior  Home/SNF/Other Home Patient oriented to: self, place, time, and situation Is this baseline? Yes   Triage Complete: Triage complete  Chief Complaint Sepsis [facility]) [A41.9]  Triage Note Pt comes via [facility] for SOB, nausea and dizziness diagnosed with covid last week. Pt has a productive cough with yellow sputum, rhonchi all over, wears CPAP at night, PTA received 18m zofran and 650 mg of tylenol    Allergies Allergies  Allergen Reactions   Zithromax [Azithromycin] Shortness Of Breath and Itching    TOTAL BODY ITCHING [EVEN SOLES OF FEET] WHEEZING    Tramadol Itching and Other (See Comments)    Has taken recently without any side effects.   Psyllium Nausea And Vomiting and Other (See Comments)    Metamucil. Sneezing      Level of Care/Admitting Diagnosis ED Disposition     ED Disposition  Admit   Condition  --   Comment  [id number]  Level of Care: Stepdown [14]  Admit to SDU based on following criteria: Hemodynamic compromise or significant risk of instability:  Patient requiring short term acute titration and management of vasoactive drips, and invasive monitoring (i.e., CVP and Arterial line).  [facility] level of care is available:: No  Covid Evaluation: Confirmed COVID Positive  Diagnosis: Sepsis ([facility] [[id number] Admitting Physician: KRise Patience[[id number] Attending Physician: KRise Patience[[id number] Certification:: I certify this patient will need inpatient services for at least 2 midnights  Estimated Length of Stay: 4          B Medical/Surgery History Past Medical History:  Diagnosis Date   Acute pansinusitis 08/02/2017   Anxiety    Arthritis    ASD (atrial  septal defect)    s/p closure with Amplatzer device [facility]   [facility]    Cataract    Chronic pain    CKD (chronic kidney disease)    Dyspnea    Fatty liver 09/04/2019   GERD (gastroesophageal reflux disease)    Heart murmur    no longer heard   History of hiatal hernia    Hyperlipidemia    Legally blind in right eye, [place]   Lumbar herniated disc    Migraines    On home oxygen therapy 06/15/2022   [facility]   OSA on CPAP 06/15/2022   PONV (postoperative nausea and vomiting)    Sciatica    Past Surgical History:  Procedure Laterality Date   ABLATION     BREAST LUMPECTOMY WITH RADIOACTIVE SEED AND SENTINEL LYMPH NODE BIOPSY Right 11/23/2020   Procedure: RIGHT BREAST LUMPECTOMY WITH RADIOACTIVE SEED AND RIGHT AXILLARY SENTINEL LYMPH NODE BIOPSY;  Surgeon: WRolm Bookbinder MD;  [facility]  Service: General;  Laterality: Right;   BREAST SURGERY Bilateral 2011   Breast Reduction Surgery   BUNIONECTOMY     CARDIAC CATHETERIZATION     10/05/04 ([facility]: LM < 25%, otherwise normal coronaries. No pulmonary HTN, Mildly enlarged RV. Secundum ASD s/p closure.   CARDIAC SURGERY     CATARACT EXTRACTION     CLEFT PALATE REPAIR     s/p cleft lip and palate  repair   EYE SURGERY Right 2019   right eye removed   LUMBAR LAMINECTOMY/DECOMPRESSION MICRODISCECTOMY Left 05/23/2016   Procedure: LEFT L4-L5 LATERAL RECESS DECOMPRESSION WITH CENTRAL AND RIGHT DECOMPRESSION VIA LEFT SIDE;  Surgeon: [name], MD;  Location: [place];  Service: Orthopedics;  Laterality: Left;   LUMBAR LAMINECTOMY/DECOMPRESSION MICRODISCECTOMY Left 05/23/2016   Procedure: LUMBAR LAMINECTOMY/DECOMPRESSION MICRODISCECTOMY Lumbar five - Sacral One 1 LEVEL;  Surgeon: [name], MD;  Location: [place];  Service: Orthopedics;  Laterality: Left;   REDUCTION MAMMAPLASTY  2011   SHOULDER INJECTION Left 05/23/2016   Procedure: SHOULDER  INJECTION;  Surgeon: [name], MD;  Location: [employer];  Service: Orthopedics;  Laterality: Left;  band-aid per pa-c   TRANSTHORACIC ECHOCARDIOGRAM     12/15/05 [facility]): Mild LVH, EF > [id number], grade 1 diastolic dysfunction, Trivial MR/PR/TR.   TUBAL LIGATION       A IV Location/Drains/Wounds Patient Lines/Drains/Airways Status     Active Line/Drains/Airways     Name Placement date Placement time Site Days   Peripheral IV 07/12/21 20 G Right Antecubital 07/12/21  1305  Antecubital  376   Peripheral IV 07/22/22 20 G Left Antecubital 07/22/22  2044  Antecubital  1   External Urinary Catheter 07/22/22  2102  --  1   Airway 11/23/20  0741  -- 607            Intake/Output Last 24 hours No intake or output data in the 24 hours ending 07/23/22 0123  Labs/Imaging Results for orders placed or performed during the hospital encounter of 07/22/22 (from the past 48 hour(s))  CBC with Differential     Status: Abnormal   Collection Time: 07/22/22 11:00 PM  Result Value Ref Range   WBC 11.0 (H) 4.0 - 10.5 K/uL   RBC 3.25 (L) 3.87 - 5.11 MIL/uL   Hemoglobin 11.6 (L) 12.0 - 15.0 g/dL   HCT 35.0 (L) 36.0 - 46.0 %   MCV 107.7 (H) 80.0 - 100.0 fL   MCH 35.7 (H) 26.0 - 34.0 pg   MCHC 33.1 30.0 - 36.0 g/dL   RDW 14.6 11.5 - 15.5 %   Platelets 212 150 - 400 K/uL   nRBC 0.0 0.0 - 0.2 %   Neutrophils Relative % 74 %   Neutro Abs 8.4 (H) 1.7 - 7.7 K/uL   Band Neutrophils 2 %   Lymphocytes Relative 23 %   Lymphs Abs 2.5 0.7 - 4.0 K/uL   Monocytes Relative 1 %   Monocytes Absolute 0.1 0.1 - 1.0 K/uL   Eosinophils Relative 0 %   Eosinophils Absolute 0.0 0.0 - 0.5 K/uL   Basophils Relative 0 %   Basophils Absolute 0.0 0.0 - 0.1 K/uL   Abs Immature Granulocytes 0.00 0.00 - 0.07 K/uL    Comment: Performed at [facility], [place] [address], [place], [place] [address]  Comprehensive metabolic panel     Status: Abnormal   Collection Time: 07/22/22 11:00 PM  Result Value Ref Range    Sodium 138 135 - 145 mmol/L   Potassium 3.1 (L) 3.5 - 5.1 mmol/L   Chloride 100 98 - 111 mmol/L   CO2 28 22 - 32 mmol/L   Glucose, Bld 118 (H) 70 - 99 mg/dL    Comment: Glucose reference range applies only to samples taken after fasting for at least 8 hours.   BUN 13 6 - 20 mg/dL   Creatinine, Ser 1.61 (H) 0.44 - 1.00 mg/dL  Calcium 8.3 (L) 8.9 - 10.3 mg/dL   Total Protein 6.9 6.5 - 8.1 g/dL   Albumin 2.9 (L) 3.5 - 5.0 g/dL   AST 37 15 - 41 U/L   ALT 26 0 - 44 U/L   Alkaline Phosphatase 135 (H) 38 - 126 U/L   Total Bilirubin 2.9 (H) 0.3 - 1.2 mg/dL   GFR, Estimated 37 (L) >60 mL/min    Comment: (NOTE) Calculated using the CKD-EPI Creatinine Equation (2021)    Anion gap 10 5 - 15    Comment: Performed at [facility], [place] [address], [place], [place] [address]  Lactic acid, plasma     Status: None   Collection Time: 07/22/22 11:00 PM  Result Value Ref Range   Lactic Acid, Venous 1.0 0.5 - 1.9 mmol/L    Comment: Performed at [facility], [place] [address], [place], [place] [address]  Brain natriuretic peptide     Status: None   Collection Time: 07/22/22 11:00 PM  Result Value Ref Range   B Natriuretic Peptide 92.7 0.0 - 100.0 pg/mL    Comment: Performed at [facility], [place] [address], [place], [place] [address]  Troponin I (High Sensitivity)     Status: Abnormal   Collection Time: 07/22/22 11:00 PM  Result Value Ref Range   Troponin I (High Sensitivity) 80 (H) <18 ng/L    Comment: (NOTE) Elevated high sensitivity troponin I (hsTnI) values and significant  changes across serial measurements may suggest ACS but many other  chronic and acute conditions are known to elevate hsTnI results.  Refer to the "Links" section for chest pain algorithms and additional  guidance. Performed at [facility], [place] [address] [place], [place] [address]    DG Chest Portable 1 View  Result Date: 07/22/2022 CLINICAL  DATA:  Shortness of breath.  Positive COVID-19. EXAM: PORTABLE CHEST 1 VIEW COMPARISON:  Chest radiograph dated 04/24/2022. FINDINGS: Right mid to lower lung field opacity most consistent with pneumonia. Clinical correlation and follow-up to resolution recommended. The left lung is clear. No pleural effusion or pneumothorax. Cardiac Amplatzer occlusive device noted. No acute osseous pathology. IMPRESSION: Right mid to lower lung field pneumonia. Electronically Signed   By: Anner Crete M.D.   On: 07/22/2022 23:00    Pending Labs Unresulted Labs (From admission, onward)     Start     Ordered   07/22/22 2340  Culture, blood (single)  (Undifferentiated -> Now sepsis confirmed (treatment and sepsis specific nursing orders))  ONCE - STAT,   STAT        07/22/22 2341   07/22/22 2218  TSH  Once,   URGENT        07/22/22 2217   07/22/22 2217  Lactic acid, plasma  Now then every 2 hours,   R (with STAT occurrences)      07/22/22 2217            Vitals/Pain Today's Vitals   07/22/22 2057 07/22/22 2230 07/22/22 2250 07/22/22 2250  BP: (!) 105/58 95/61    Pulse: (!) 121 (!) 109    Resp: 20 (!) 27    Temp: 98.6 F (37 C)  (!) 101.5 F (38.6 C)   TempSrc: Oral  Rectal   SpO2: 95% 90%    Weight: 90.7 kg     Height: 5' 8"$  (1.727 m)     PainSc:    0-No pain    Isolation Precautions No active isolations  Medications Medications  lactated ringers infusion (has no administration in time range)  lactated ringers bolus 1,000 mL (1,000 mLs Intravenous New Bag/Given 07/22/22 2357)    And  lactated ringers bolus 1,000 mL (0 mLs Intravenous Stopped 07/23/22 0119)    And  lactated ringers bolus 1,000 mL (1,000 mLs Intravenous New Bag/Given 07/23/22 0120)  ceFEPIme (MAXIPIME) 2 g in sodium chloride 0.9 % 100 mL IVPB (has no administration in time range)  vancomycin (VANCOREADY) IVPB 2000 mg/400 mL (2,000 mg Intravenous New Bag/Given 07/23/22 0004)  metroNIDAZOLE (FLAGYL) IVPB 500 mg (0 mg  Intravenous Stopped 07/23/22 0120)    Mobility walks     Focused Assessments Cardiac Assessment Handoff:  Cardiac Rhythm: Sinus tachycardia Lab Results  Component Value Date   CKTOTAL 77 06/04/2009   CKMB 1.1 06/04/2009   TROPONINI <0.03 02/22/2015   Lab Results  Component Value Date   DDIMER 2.45 (H) 04/24/2022   Does the Patient currently have chest pain? No    R Recommendations: See Admitting Provider Note  Report given to:   Additional Notes: AAOx4, ambulates husband at bedside, here for SOB, post COVID pneumonia.

## 2022-07-23 NOTE — Progress Notes (Signed)
Pharmacy Antibiotic Note  Brandy [name] is a 57 y.o. female admitted on 07/22/2022 with pneumonia. In the ED patient received Cefepime 2gm IV, Vancomycin 2gm IV and Flagyl 519m IV x 1 dose each.  Pharmacy has been consulted for Vancomycin and Cefepime dosing.  Plan: Doxycycline per MD Cefepime 2gm IV q8h D/C vancomycin today per MD as MRSA PCR is negative Pharmacy will sign off at this time.  Please reconsult if a change in clinical status warrants re-evaluation of dosage.  Height: 5' 8"$  (172.7 cm) Weight: 90.7 kg (200 lb) IBW/kg (Calculated) : 63.9  Temp (24hrs), Avg:99.8 F (37.7 C), Min:98.6 F (37 C), Max:101.5 F (38.6 C)  Recent Labs  Lab 07/22/22 2300 07/23/22 0253 07/23/22 0530 07/23/22 0917  WBC 11.0*  --   --  10.1  CREATININE 1.61*  --  0.95  --   LATICACIDVEN 1.0 1.5  --   --      Estimated Creatinine Clearance: 76 mL/min (by C-G formula based on SCr of 0.95 mg/dL).    Allergies  Allergen Reactions   Zithromax [Azithromycin] Shortness Of Breath and Itching    TOTAL BODY ITCHING [EVEN SOLES OF FEET] WHEEZING    Tramadol Itching and Other (See Comments)    Has taken recently without any side effects.   Psyllium Nausea And Vomiting and Other (See Comments)    Metamucil. Sneezing      Antimicrobials this admission: 2/17 Metronidazole x 1 2/17 Vancomycin >>  2/17 2/17 Cefepime >>   2/17 Doxycycline >>  Dose adjustments this admission: 2/17 empiric renal dose adjustment  Microbiology results: 2/16 BCx:  ip 2/17 MRSA PCR: not detected  Thank you for allowing pharmacy to be a part of this patient's care.  CGretta ArabPharmD, BCPS [facility] main pharmacy [phone]/17/2024 11:29 AM

## 2022-07-23 NOTE — Progress Notes (Signed)
Pt being followed by [facility] for Sepsis protocol. 

## 2022-07-24 DIAGNOSIS — J189 Pneumonia, unspecified organism: Secondary | ICD-10-CM | POA: Diagnosis not present

## 2022-07-24 LAB — COMPREHENSIVE METABOLIC PANEL
ALT: 23 U/L (ref 0–44)
AST: 34 U/L (ref 15–41)
Albumin: 2.3 g/dL — ABNORMAL LOW (ref 3.5–5.0)
Alkaline Phosphatase: 94 U/L (ref 38–126)
Anion gap: 8 (ref 5–15)
BUN: 11 mg/dL (ref 6–20)
CO2: 28 mmol/L (ref 22–32)
Calcium: 8.3 mg/dL — ABNORMAL LOW (ref 8.9–10.3)
Chloride: 100 mmol/L (ref 98–111)
Creatinine, Ser: 1.22 mg/dL — ABNORMAL HIGH (ref 0.44–1.00)
GFR, Estimated: 51 mL/min — ABNORMAL LOW (ref >60)
Glucose, Bld: 144 mg/dL — ABNORMAL HIGH (ref 70–99)
Potassium: 3.2 mmol/L — ABNORMAL LOW (ref 3.5–5.1)
Sodium: 136 mmol/L (ref 135–145)
Total Bilirubin: 2 mg/dL — ABNORMAL HIGH (ref 0.3–1.2)
Total Protein: 6 g/dL — ABNORMAL LOW (ref 6.5–8.1)

## 2022-07-24 LAB — D-DIMER, QUANTITATIVE: D-Dimer, Quant: 0.96 ug/mL-FEU — ABNORMAL HIGH (ref 0.00–0.50)

## 2022-07-24 LAB — CBC
HCT: 30.6 % — ABNORMAL LOW (ref 36.0–46.0)
Hemoglobin: 10 g/dL — ABNORMAL LOW (ref 12.0–15.0)
MCH: 36 pg — ABNORMAL HIGH (ref 26.0–34.0)
MCHC: 32.7 g/dL (ref 30.0–36.0)
MCV: 110.1 fL — ABNORMAL HIGH (ref 80.0–100.0)
Platelets: 192 10*3/uL (ref 150–400)
RBC: 2.78 MIL/uL — ABNORMAL LOW (ref 3.87–5.11)
RDW: 14.3 % (ref 11.5–15.5)
WBC: 7.5 10*3/uL (ref 4.0–10.5)
nRBC: 0 % (ref 0.0–0.2)

## 2022-07-24 LAB — C-REACTIVE PROTEIN: CRP: 8.8 mg/dL — ABNORMAL HIGH (ref <1.0)

## 2022-07-24 LAB — MAGNESIUM: Magnesium: 1.2 mg/dL — ABNORMAL LOW (ref 1.7–2.4)

## 2022-07-24 MED ORDER — LIFITEGRAST 5 % OP SOLN: 1.0000 [drp] | Freq: Two times a day (BID) | OPHTHALMIC | Status: DC

## 2022-07-24 MED ORDER — MAGNESIUM SULFATE 4 GM/100ML IV SOLN
4.0000 g | Freq: Once | INTRAVENOUS | Status: AC
  Administered 2022-07-24: 4 g via INTRAVENOUS
  Filled 2022-07-24: qty 100

## 2022-07-24 MED ORDER — POLYVINYL ALCOHOL 1.4 % OP SOLN
1.0000 [drp] | Freq: Four times a day (QID) | OPHTHALMIC | Status: DC
  Administered 2022-07-24 – 2022-07-29 (×20): 1 [drp] via OPHTHALMIC
  Filled 2022-07-24: qty 15

## 2022-07-24 MED ORDER — SODIUM CHLORIDE 0.9 % IV SOLN
2.0000 g | Freq: Three times a day (TID) | INTRAVENOUS | Status: AC
  Administered 2022-07-24 – 2022-07-28 (×14): 2 g via INTRAVENOUS
  Filled 2022-07-24 (×14): qty 12.5

## 2022-07-24 MED ORDER — ALBUTEROL SULFATE (2.5 MG/3ML) 0.083% IN NEBU
2.5000 mg | INHALATION_SOLUTION | Freq: Three times a day (TID) | RESPIRATORY_TRACT | Status: DC
  Administered 2022-07-24 – 2022-07-27 (×8): 2.5 mg via RESPIRATORY_TRACT
  Filled 2022-07-24 (×10): qty 3

## 2022-07-24 MED ORDER — HYDROCOD POLI-CHLORPHE POLI ER 10-8 MG/5ML PO SUER
5.0000 mL | Freq: Two times a day (BID) | ORAL | Status: DC
  Administered 2022-07-24 – 2022-07-29 (×11): 5 mL via ORAL
  Filled 2022-07-24 (×11): qty 5

## 2022-07-24 MED ORDER — ARTIFICIAL TEARS OPHTHALMIC OINT: 1.0000 | TOPICAL_OINTMENT | Freq: Every evening | OPHTHALMIC | Status: DC | PRN

## 2022-07-24 MED ORDER — POTASSIUM CHLORIDE CRYS ER 20 MEQ PO TBCR
40.0000 meq | EXTENDED_RELEASE_TABLET | ORAL | Status: AC
  Administered 2022-07-24 (×2): 40 meq via ORAL
  Filled 2022-07-24 (×2): qty 2

## 2022-07-24 NOTE — Progress Notes (Signed)
[facility] Progress Note  Patient: Brandy [name] [facility]     [id number]  DOA: 07/22/2022   PCP: Sandrea Hughs, NP       Brief hospital course: Brandy [name], a 57 year old female with a complex medical history including chronic respiratory failure necessitating 2 L of oxygen, ASD repair in 2006, metastatic breast cancer managed by Dr. Irene Limbo, GERD, anxiety, chronic kidney disease stage III, and chronic hypotension, presented to the emergency department due to a persistent cough productive of sputum, weakness, and dizziness. Approximately one week ago, she received a diagnosis of COVID-19 during an episode of shortness of breath and diarrhea. Despite being prescribed Molnupiravir, she was unable to afford the medication and did not take it. In the past 24 hours, her respiratory symptoms intensified, accompanied by increased weakness and dizziness, prompting her to seek emergency care. She also experienced several episodes of watery diarrhea. Additionally, the patient reported chronic congestion in her left eye.   Upon evaluation in the emergency department, she was found to have a fever with a temperature of 101.24F. Chest x-ray> infiltrates in the right middle and lower lobes indicative of pneumonia. She remained stable on her baseline oxygen requirement of 2 L.  Temp 101. 5, RR > 20, HR > 100 WBC>  11 T Bili 2.9  high-sensitive troponin levels > 80, subsequently decreasing to 48.  Started on empiric antibiotics. The COVID-19 test returned positive.   Subjective:  Shortness of breath is improving. Her cough is severe.   Assessment and Plan:  Principal Problem:   Postviral Pneumonia, recent COVID-19 virus infection -   sepsis   Chronic respiratory failure with hypoxia - procalcitonin 26.39 - congested cough and dyspnea - cont Maxipime and Doxy, albuterol inhaler and cough meds and Dulera - she is improving  Active Problems: Elevated T bili - due to  sepsis - now 2.0  Left conjunctiva injection and edema - this is chronic- have resumed her home eye drops  Hypokalemia, hypomagnesemia - K 3.2 again - Mg 1.2 - replace and follow  CKD 3b - Cr baseline 1.2- 1.6 - at baseline currently    Anxiety disorder - Xanax QHS    Breast cancer, metastatic   - on immunotherapy - Dr Irene Limbo  Chronic hypotension Hypertensive today - hold Midodrine    GERD (gastroesophageal reflux disease) - pantoprozole   ASD repair Right eye enucleated          Code Status: Full Code Consultants: none Level of Care: Level of care: Stepdown Total time on patient care: 35 DVT prophylaxis:  enoxaparin (LOVENOX) injection 40 mg Start: 07/23/22 1000     Objective:   Vitals:   07/24/22 0500 07/24/22 0600 07/24/22 0752 07/24/22 0800  BP: 103/79 (!) 161/62    Pulse: 98 87    Resp: (!) 25 (!) 27    Temp:    99.2 F (37.3 C)  TempSrc:    Oral  SpO2: 98% 98% 97%   Weight:      Height:       Filed Weights   07/22/22 2057  Weight: 90.7 kg   Exam: General exam: Appears comfortable  HEENT: right eye- edematous and red- left enucleated Respiratory system: a great deal of coughing, RR elevated > 20, rhonchi  Cardiovascular system: S1 & S2 heard  Gastrointestinal system: Abdomen soft, non-tender, nondistended. Normal bowel sounds   Extremities: No cyanosis, clubbing or edema Psychiatry:  Mood & affect appropriate.  CBC: Recent Labs  Lab 07/22/22 2300 07/23/22 0917 07/24/22 0250  WBC 11.0* 10.1 7.5  NEUTROABS 8.4* 7.2  --   HGB 11.6* 12.9 10.0*  HCT 35.0* 40.7 30.6*  MCV 107.7* 113.4* 110.1*  PLT 212 189 [id number]    Basic Metabolic Panel: Recent Labs  Lab 07/22/22 2300 07/23/22 0530 07/23/22 1115 07/24/22 0250 07/24/22 0738  NA 138 144 141 136  --   K 3.1* 2.3* 3.2* 3.2*  --   CL 100 116* 105 100  --   CO2 28 18* 27 28  --   GLUCOSE 118* 70 91 144*  --   BUN 13 8 11 11  $ --   CREATININE 1.61* 0.95 1.21* 1.22*  --    CALCIUM 8.3* 4.7* 8.2* 8.3*  --   MG  --   --   --   --  1.2*    GFR: Estimated Creatinine Clearance: 59.2 mL/min (A) (by C-G formula based on SCr of 1.22 mg/dL (H)).  Scheduled Meds:  albuterol  2.5 mg Nebulization TID   ALPRAZolam  1 mg Oral QHS   amitriptyline  75 mg Oral QHS   vitamin C  1,000 mg Oral Daily   aspirin EC  81 mg Oral Daily   Chlorhexidine Gluconate Cloth  6 each Topical Daily   chlorpheniramine-HYDROcodone  5 mL Oral Q12H   enoxaparin (LOVENOX) injection  40 mg Subcutaneous Q24H   ezetimibe  10 mg Oral Daily   feeding supplement  237 mL Oral TID BM   guaiFENesin-dextromethorphan  5 mL Oral QID   Lifitegrast  1 drop Left Eye BID   midodrine  5 mg Oral TID WC   mometasone-formoterol  2 puff Inhalation BID   pantoprazole  40 mg Oral Daily   polyvinyl alcohol  1 drop Left Eye QID   potassium chloride  40 mEq Oral Q4H   Continuous Infusions:  ceFEPime (MAXIPIME) IV Stopped (07/24/22 0336)   doxycycline (VIBRAMYCIN) IV Stopped (07/24/22 0549)   magnesium sulfate bolus IVPB     Imaging and lab data was personally reviewed ECHOCARDIOGRAM COMPLETE  Result Date: 07/23/2022    ECHOCARDIOGRAM REPORT   Patient Name:   Brandy [name] Date of Exam: 07/23/2022 Medical Rec #:  [id number]                    Height:       68.0 in Accession #:    [id number]                   Weight:       200.0 lb Date of Birth:  04-05-1966                    BSA:          2.044 m Patient Age:    8 years                     BP:           137/65 mmHg Patient Gender: F                            HR:           86 bpm. Exam Location:  Inpatient Procedure: 2D Echo and Intracardiac Opacification Agent Indications:    elevated troponin  History:        Patient has prior history of Echocardiogram  examinations, most                 recent 06/03/2021. Arrythmias:Tachycardia; Risk                 Factors:Dyslipidemia.  Sonographer:    Harvie Junior Referring Phys: [place]   Sonographer Comments: Technically difficult study due to poor echo windows and patient is obese. Image acquisition challenging due to patient body habitus and Image acquisition challenging due to uncooperative patient. IMPRESSIONS  1. Left ventricular ejection fraction, by estimation, is 60 to 65%. The left ventricle has normal function. The left ventricle has no regional wall motion abnormalities. Left ventricular diastolic parameters are consistent with Grade I diastolic dysfunction (impaired relaxation).  2. Right ventricular systolic function is normal. The right ventricular size is normal. Tricuspid regurgitation signal is inadequate for assessing PA pressure.  3. The mitral valve is normal in structure. Trivial mitral valve regurgitation. No evidence of mitral stenosis.  4. The aortic valve is tricuspid. Aortic valve regurgitation is not visualized. Aortic valve sclerosis is present, with no evidence of aortic valve stenosis.  5. The inferior vena cava is normal in size with greater than 50% respiratory variability, suggesting right atrial pressure of 3 mmHg. FINDINGS  Left Ventricle: Left ventricular ejection fraction, by estimation, is 60 to 65%. The left ventricle has normal function. The left ventricle has no regional wall motion abnormalities. Definity contrast agent was given IV to delineate the left ventricular  endocardial borders. The left ventricular internal cavity size was normal in size. There is no left ventricular hypertrophy. Left ventricular diastolic parameters are consistent with Grade I diastolic dysfunction (impaired relaxation). Right Ventricle: The right ventricular size is normal. Right ventricular systolic function is normal. Tricuspid regurgitation signal is inadequate for assessing PA pressure. The tricuspid regurgitant velocity is 2.40 m/s, and with an assumed right atrial  pressure of 3 mmHg, the estimated right ventricular systolic pressure is [id number] mmHg. Left Atrium: Left atrial  size was normal in size. Right Atrium: Right atrial size was normal in size. Pericardium: There is no evidence of pericardial effusion. Mitral Valve: The mitral valve is normal in structure. Trivial mitral valve regurgitation. No evidence of mitral valve stenosis. Tricuspid Valve: The tricuspid valve is normal in structure. Tricuspid valve regurgitation is trivial. No evidence of tricuspid stenosis. Aortic Valve: The aortic valve is tricuspid. Aortic valve regurgitation is not visualized. Aortic valve sclerosis is present, with no evidence of aortic valve stenosis. Aortic valve mean gradient measures 4.0 mmHg. Aortic valve peak gradient measures 7.4  mmHg. Aortic valve area, by VTI measures 2.02 cm. Pulmonic Valve: The pulmonic valve was normal in structure. Pulmonic valve regurgitation is trivial. No evidence of pulmonic stenosis. Aorta: The aortic root is normal in size and structure. Venous: The inferior vena cava is normal in size with greater than 50% respiratory variability, suggesting right atrial pressure of 3 mmHg. IAS/Shunts: No atrial level shunt detected by color flow Doppler.  LEFT VENTRICLE PLAX 2D LVIDd:         3.60 cm     Diastology LVIDs:         2.50 cm     LV e' medial:    6.85 cm/s LV PW:         0.90 cm     LV E/e' medial:  9.0 LV IVS:        0.90 cm     LV e' lateral:   8.05 cm/s LVOT diam:  1.80 cm     LV E/e' lateral: 7.6 LV SV:         53 LV SV Index:   26 LVOT Area:     2.54 cm  LV Volumes (MOD) LV vol d, MOD A2C: 60.8 ml LV vol d, MOD A4C: 66.1 ml LV vol s, MOD A2C: 20.7 ml LV vol s, MOD A4C: 29.3 ml LV SV MOD A2C:     40.1 ml LV SV MOD A4C:     66.1 ml LV SV MOD BP:      37.4 ml RIGHT VENTRICLE RV Basal diam:  3.60 cm RV Mid diam:    2.80 cm RV S prime:     11.70 cm/s TAPSE (M-mode): 1.8 cm LEFT ATRIUM           Index        RIGHT ATRIUM          Index LA diam:      2.20 cm 1.08 cm/m   RA Area:     9.17 cm LA Vol (A2C): 31.1 ml 15.22 ml/m  RA Volume:   17.60 ml 8.61 ml/m LA Vol  (A4C): 28.0 ml 13.70 ml/m  AORTIC VALVE                    PULMONIC VALVE AV Area (Vmax):    2.21 cm     PV Vmax:          0.93 m/s AV Area (Vmean):   2.12 cm     PV Peak grad:     3.5 mmHg AV Area (VTI):     2.02 cm     PR End Diast Vel: 6.86 msec AV Vmax:           136.00 cm/s AV Vmean:          93.100 cm/s AV VTI:            0.261 m AV Peak Grad:      7.4 mmHg AV Mean Grad:      4.0 mmHg LVOT Vmax:         118.00 cm/s LVOT Vmean:        77.700 cm/s LVOT VTI:          0.207 m LVOT/AV VTI ratio: 0.79  AORTA Ao Root diam: 3.30 cm Ao Asc diam:  3.10 cm MITRAL VALVE               TRICUSPID VALVE MV Area (PHT): 3.77 cm    TR Peak grad:   23.0 mmHg MV Decel Time: 201 msec    TR Vmax:        240.00 cm/s MR Peak grad: 41.2 mmHg MR Vmax:      321.00 cm/s  SHUNTS MV E velocity: 61.40 cm/s  Systemic VTI:  0.21 m MV A velocity: 93.20 cm/s  Systemic Diam: 1.80 cm MV E/A ratio:  0.66 Kirk Ruths MD Electronically signed by Kirk Ruths MD Signature Date/Time: 07/23/2022/9:02:47 AM    Final    DG Chest Portable 1 View  Result Date: 07/22/2022 CLINICAL DATA:  Shortness of breath.  Positive COVID-19. EXAM: PORTABLE CHEST 1 VIEW COMPARISON:  Chest radiograph dated 04/24/2022. FINDINGS: Right mid to lower lung field opacity most consistent with pneumonia. Clinical correlation and follow-up to resolution recommended. The left lung is clear. No pleural effusion or pneumothorax. Cardiac Amplatzer occlusive device noted. No acute osseous pathology. IMPRESSION: Right mid to lower lung field pneumonia. Electronically Signed  By: Anner Crete M.D.   On: 07/22/2022 23:00    LOS: 1 day   Author: Debbe Odea  07/24/2022 11:43 AM  To contact [facility]>   Check the care team in [facility] and look for the attending/consulting [place] provider listed  Log into [email or web address] and use [place]'s universal password   Go to> "[facility]"  and find provider  If you still have difficulty reaching the provider,  please page the [facility] (Director on Call) for the Hospitalists listed on amion

## 2022-07-25 DIAGNOSIS — J189 Pneumonia, unspecified organism: Secondary | ICD-10-CM | POA: Diagnosis not present

## 2022-07-25 DIAGNOSIS — R7989 Other specified abnormal findings of blood chemistry: Secondary | ICD-10-CM | POA: Diagnosis not present

## 2022-07-25 DIAGNOSIS — A419 Sepsis, unspecified organism: Secondary | ICD-10-CM | POA: Diagnosis not present

## 2022-07-25 LAB — COMPREHENSIVE METABOLIC PANEL
ALT: 23 U/L (ref 0–44)
AST: 34 U/L (ref 15–41)
Albumin: 2.6 g/dL — ABNORMAL LOW (ref 3.5–5.0)
Alkaline Phosphatase: 109 U/L (ref 38–126)
Anion gap: 9 (ref 5–15)
BUN: 13 mg/dL (ref 6–20)
CO2: 26 mmol/L (ref 22–32)
Calcium: 8.4 mg/dL — ABNORMAL LOW (ref 8.9–10.3)
Chloride: 98 mmol/L (ref 98–111)
Creatinine, Ser: 1.14 mg/dL — ABNORMAL HIGH (ref 0.44–1.00)
GFR, Estimated: 56 mL/min — ABNORMAL LOW (ref >60)
Glucose, Bld: 121 mg/dL — ABNORMAL HIGH (ref 70–99)
Potassium: 3.8 mmol/L (ref 3.5–5.1)
Sodium: 133 mmol/L — ABNORMAL LOW (ref 135–145)
Total Bilirubin: 2 mg/dL — ABNORMAL HIGH (ref 0.3–1.2)
Total Protein: 6.1 g/dL — ABNORMAL LOW (ref 6.5–8.1)

## 2022-07-25 LAB — C-REACTIVE PROTEIN: CRP: 9.5 mg/dL — ABNORMAL HIGH (ref <1.0)

## 2022-07-25 LAB — D-DIMER, QUANTITATIVE: D-Dimer, Quant: 0.9 ug/mL-FEU — ABNORMAL HIGH (ref 0.00–0.50)

## 2022-07-25 MED ORDER — BENZONATATE 100 MG PO CAPS
100.0000 mg | ORAL_CAPSULE | Freq: Once | ORAL | Status: AC
  Administered 2022-07-25: 100 mg via ORAL
  Filled 2022-07-25: qty 1

## 2022-07-25 MED ORDER — DICLOFENAC SODIUM 1 % EX GEL
2.0000 g | Freq: Four times a day (QID) | CUTANEOUS | Status: DC
  Administered 2022-07-25 – 2022-07-29 (×15): 2 g via TOPICAL
  Filled 2022-07-25: qty 100

## 2022-07-25 MED ORDER — ACETAMINOPHEN 500 MG PO TABS
1000.0000 mg | ORAL_TABLET | Freq: Three times a day (TID) | ORAL | Status: DC
  Administered 2022-07-25 – 2022-07-29 (×11): 1000 mg via ORAL
  Filled 2022-07-25 (×13): qty 2

## 2022-07-25 MED ORDER — DEXAMETHASONE 2 MG PO TABS
4.0000 mg | ORAL_TABLET | Freq: Three times a day (TID) | ORAL | Status: DC
  Administered 2022-07-25 – 2022-07-27 (×6): 4 mg via ORAL
  Filled 2022-07-25 (×6): qty 2

## 2022-07-25 NOTE — Progress Notes (Signed)
[facility] Progress Note  Patient: Brandy [name] [facility]     [id number]  DOA: 07/22/2022   PCP: Sandrea Hughs, NP       Brief hospital course: [name], a 57 year old female with a complex medical history including chronic respiratory failure necessitating 2 L of oxygen, ASD repair in 2006, metastatic breast cancer managed by Dr. Irene Limbo, GERD, anxiety, chronic kidney disease stage III, and chronic hypotension, presented to the emergency department due to a persistent cough productive of sputum, weakness, and dizziness. Approximately one week ago, she received a diagnosis of COVID-19 during an episode of shortness of breath and diarrhea. Despite being prescribed Molnupiravir, she was unable to afford the medication and did not take it. In the past 24 hours, her respiratory symptoms intensified, accompanied by increased weakness and dizziness, prompting her to seek emergency care. She also experienced several episodes of watery diarrhea. Additionally, the patient reported chronic congestion in her left eye.   Upon evaluation in the emergency department, she was found to have a fever with a temperature of 101.22F. Chest x-ray> infiltrates in the right middle and lower lobes indicative of pneumonia. She remained stable on her baseline oxygen requirement of 2 L.  Temp 101. 5, RR > 20, HR > 100 WBC>  11 T Bili 2.9  high-sensitive troponin levels > 80, subsequently decreasing to 48.  Started on empiric antibiotics. The COVID-19 test returned positive.   Subjective:  She had a fever last night and feels weak today. Cough is still severe. She is eating.   Assessment and Plan:  Principal Problem:   Postviral Pneumonia, recent COVID-19 virus infection -   sepsis   Chronic respiratory failure with hypoxia - procalcitonin 26.39 - congested cough and dyspnea - she had diarrhea but this has resolved - cont Maxipime and Doxy, albuterol inhaler and cough meds and  Dulera - fever 101 last night- CRP has risen to 9 today-although she is not wheezing, steroids may help with the inflammation- add sputum culture - on 2 L which is her baseline  Active Problems: Elevated T bili - due to sepsis - now 2.0  Left conjunctiva injection and edema - this is chronic- have resumed her home eye drops  Hypokalemia, hypomagnesemia - K 3.2 again - Mg 1.2 - replace and follow  CKD 3b - Cr baseline 1.2- 1.6 - at baseline currently    Anxiety disorder - Xanax QHS    Breast cancer, metastatic   - on immunotherapy - Dr Irene Limbo  Chronic hypotension Hypertensive today - hold Midodrine if SBP < 125    GERD (gastroesophageal reflux disease) - pantoprozole   ASD repair Right eye enucleated          Code Status: Full Code Consultants: none Level of Care: Level of care: Stepdown Total time on patient care: 35 DVT prophylaxis:  enoxaparin (LOVENOX) injection 40 mg Start: 07/23/22 1000     Objective:   Vitals:   07/25/22 0800 07/25/22 0852 07/25/22 1000 07/25/22 1200  BP: (!) 105/48  (!) 122/55   Pulse: 86 83 93   Resp:  18    Temp: 99.5 F (37.5 C)   99.6 F (37.6 C)  TempSrc: Oral     SpO2: 94% 99% 92%   Weight:      Height:       Filed Weights   07/22/22 2057  Weight: 90.7 kg   Exam: General exam: Appears comfortable  HEENT: oral mucosa moist Respiratory system: Clear  to auscultation.  Cardiovascular system: S1 & S2 heard  Gastrointestinal system: Abdomen soft, non-tender, nondistended. Normal bowel sounds   Extremities: No cyanosis, clubbing or edema Psychiatry:  Mood & affect appropriate.    CBC: Recent Labs  Lab 07/22/22 2300 07/23/22 0917 07/24/22 0250  WBC 11.0* 10.1 7.5  NEUTROABS 8.4* 7.2  --   HGB 11.6* 12.9 10.0*  HCT 35.0* 40.7 30.6*  MCV 107.7* 113.4* 110.1*  PLT 212 189 [id number]    Basic Metabolic Panel: Recent Labs  Lab 07/22/22 2300 07/23/22 0530 07/23/22 1115 07/24/22 0250 07/24/22 0738 07/25/22 0315   NA 138 144 141 136  --  133*  K 3.1* 2.3* 3.2* 3.2*  --  3.8  CL 100 116* 105 100  --  98  CO2 28 18* 27 28  --  26  GLUCOSE 118* 70 91 144*  --  121*  BUN 13 8 11 11  $ --  13  CREATININE 1.61* 0.95 1.21* 1.22*  --  1.14*  CALCIUM 8.3* 4.7* 8.2* 8.3*  --  8.4*  MG  --   --   --   --  1.2*  --     GFR: Estimated Creatinine Clearance: 63.3 mL/min (A) (by C-G formula based on SCr of 1.14 mg/dL (H)).  Scheduled Meds:  acetaminophen  1,000 mg Oral TID   albuterol  2.5 mg Nebulization TID   ALPRAZolam  1 mg Oral QHS   amitriptyline  75 mg Oral QHS   vitamin C  1,000 mg Oral Daily   aspirin EC  81 mg Oral Daily   Chlorhexidine Gluconate Cloth  6 each Topical Daily   chlorpheniramine-HYDROcodone  5 mL Oral Q12H   diclofenac Sodium  2 g Topical QID   enoxaparin (LOVENOX) injection  40 mg Subcutaneous Q24H   ezetimibe  10 mg Oral Daily   feeding supplement  237 mL Oral TID BM   guaiFENesin-dextromethorphan  5 mL Oral QID   Lifitegrast  1 drop Left Eye BID   midodrine  5 mg Oral TID WC   mometasone-formoterol  2 puff Inhalation BID   pantoprazole  40 mg Oral Daily   polyvinyl alcohol  1 drop Left Eye QID   Continuous Infusions:  ceFEPime (MAXIPIME) IV Stopped (07/25/22 0640)   doxycycline (VIBRAMYCIN) IV Stopped (07/25/22 0553)   Imaging and lab data was personally reviewed No results found.  LOS: 2 days   Author: Debbe Odea  07/25/2022 12:46 PM  To contact [facility]>   Check the care team in [facility] and look for the attending/consulting [place] provider listed  Log into [email or web address] and use [place]'s universal password   Go to> "[facility]"  and find provider  If you still have difficulty reaching the provider, please page the [facility] (Director on Call) for the Hospitalists listed on amion

## 2022-07-26 ENCOUNTER — Encounter (HOSPITAL_COMMUNITY): Payer: Medicare Other

## 2022-07-26 ENCOUNTER — Inpatient Hospital Stay (HOSPITAL_COMMUNITY): Payer: Medicare Other

## 2022-07-26 DIAGNOSIS — J189 Pneumonia, unspecified organism: Secondary | ICD-10-CM | POA: Diagnosis not present

## 2022-07-26 DIAGNOSIS — R7989 Other specified abnormal findings of blood chemistry: Secondary | ICD-10-CM | POA: Diagnosis not present

## 2022-07-26 DIAGNOSIS — A419 Sepsis, unspecified organism: Secondary | ICD-10-CM | POA: Diagnosis not present

## 2022-07-26 LAB — COMPREHENSIVE METABOLIC PANEL
ALT: 25 U/L (ref 0–44)
AST: 36 U/L (ref 15–41)
Albumin: 2.7 g/dL — ABNORMAL LOW (ref 3.5–5.0)
Alkaline Phosphatase: 145 U/L — ABNORMAL HIGH (ref 38–126)
Anion gap: 10 (ref 5–15)
BUN: 21 mg/dL — ABNORMAL HIGH (ref 6–20)
CO2: 26 mmol/L (ref 22–32)
Calcium: 9.1 mg/dL (ref 8.9–10.3)
Chloride: 101 mmol/L (ref 98–111)
Creatinine, Ser: 1.17 mg/dL — ABNORMAL HIGH (ref 0.44–1.00)
GFR, Estimated: 54 mL/min — ABNORMAL LOW (ref >60)
Glucose, Bld: 240 mg/dL — ABNORMAL HIGH (ref 70–99)
Potassium: 4.2 mmol/L (ref 3.5–5.1)
Sodium: 137 mmol/L (ref 135–145)
Total Bilirubin: 1.6 mg/dL — ABNORMAL HIGH (ref 0.3–1.2)
Total Protein: 7.1 g/dL (ref 6.5–8.1)

## 2022-07-26 LAB — FOLATE: Folate: 19.3 ng/mL (ref >5.9)

## 2022-07-26 LAB — HIV ANTIBODY (ROUTINE TESTING W REFLEX): HIV Screen 4th Generation wRfx: NONREACTIVE

## 2022-07-26 LAB — GLUCOSE, CAPILLARY
Glucose-Capillary: 210 mg/dL — ABNORMAL HIGH (ref 70–99)
Glucose-Capillary: 171 mg/dL — ABNORMAL HIGH (ref 70–99)
Glucose-Capillary: 216 mg/dL — ABNORMAL HIGH (ref 70–99)
Glucose-Capillary: 214 mg/dL — ABNORMAL HIGH (ref 70–99)

## 2022-07-26 LAB — HEMOGLOBIN A1C
Hgb A1c MFr Bld: 5.3 % (ref 4.8–5.6)
Mean Plasma Glucose: 105.41 mg/dL

## 2022-07-26 LAB — VITAMIN B12: Vitamin B-12: 358 pg/mL (ref 180–914)

## 2022-07-26 LAB — MAGNESIUM: Magnesium: 1.5 mg/dL — ABNORMAL LOW (ref 1.7–2.4)

## 2022-07-26 LAB — D-DIMER, QUANTITATIVE: D-Dimer, Quant: 0.76 ug/mL-FEU — ABNORMAL HIGH (ref 0.00–0.50)

## 2022-07-26 LAB — C-REACTIVE PROTEIN: CRP: 17 mg/dL — ABNORMAL HIGH (ref <1.0)

## 2022-07-26 MED ORDER — INSULIN ASPART 100 UNIT/ML IJ SOLN
0.0000 [IU] | Freq: Three times a day (TID) | INTRAMUSCULAR | Status: DC
  Administered 2022-07-26 (×2): 5 [IU] via SUBCUTANEOUS
  Administered 2022-07-26: 3 [IU] via SUBCUTANEOUS
  Administered 2022-07-27 (×2): 5 [IU] via SUBCUTANEOUS
  Administered 2022-07-27: 3 [IU] via SUBCUTANEOUS
  Administered 2022-07-28 – 2022-07-29 (×3): 2 [IU] via SUBCUTANEOUS

## 2022-07-26 MED ORDER — MAGNESIUM SULFATE 2 GM/50ML IV SOLN
2.0000 g | Freq: Once | INTRAVENOUS | Status: AC
  Administered 2022-07-26: 2 g via INTRAVENOUS
  Filled 2022-07-26: qty 50

## 2022-07-26 MED ORDER — BENZONATATE 100 MG PO CAPS
200.0000 mg | ORAL_CAPSULE | Freq: Once | ORAL | Status: AC
  Administered 2022-07-26: 200 mg via ORAL
  Filled 2022-07-26: qty 2

## 2022-07-26 MED ORDER — INSULIN ASPART 100 UNIT/ML IJ SOLN: 0.0000 [IU] | Freq: Three times a day (TID) | INTRAMUSCULAR | Status: DC

## 2022-07-26 MED ORDER — ORAL CARE MOUTH RINSE: 15.0000 mL | OROMUCOSAL | Status: DC | PRN

## 2022-07-26 NOTE — Progress Notes (Addendum)
[facility] Progress Note  Patient: Brandy [name]     [id number]  DOA: 07/22/2022   PCP: Sandrea Hughs, NP       Brief hospital course: Brandy [name], a 57 year old female with a complex medical history including chronic respiratory failure necessitating 2 L of oxygen, ASD repair in 2006, metastatic breast cancer managed by Dr. Irene Limbo, GERD, anxiety, chronic kidney disease stage III, and chronic hypotension, presented to the emergency department due to a persistent cough productive of sputum, weakness, and dizziness. Approximately one week ago, she received a diagnosis of COVID-19 during an episode of shortness of breath and diarrhea. Despite being prescribed Molnupiravir, she was unable to afford the medication and did not take it. In the past 24 hours, her respiratory symptoms intensified, accompanied by increased weakness and dizziness, prompting her to seek emergency care. She also experienced several episodes of watery diarrhea. Additionally, the patient reported chronic congestion in her left eye.   Upon evaluation in the emergency department, she was found to have a fever with a temperature of 101.67F. Chest x-ray> infiltrates in the right middle and lower lobes indicative of pneumonia. She remained stable on her baseline oxygen requirement of 2 L.  Temp 101. 5, RR > 20, HR > 100 WBC>  11 T Bili 2.9  high-sensitive troponin levels > 80, subsequently decreasing to 48.  Started on empiric antibiotics. The COVID-19 test returned positive.   Subjective:  She still has a severe cough and chest pain when she coughs. She did not receive the K pad I ordered yesterday. Eating well and as no trouble urinating. Getting up to a chair.   Assessment and Plan:  Principal Problem:   Postviral Pneumonia, recent COVID-19 virus infection -   sepsis POA Chest pain related to cough   Chronic respiratory failure with hypoxia - on 2 L O2 - procalcitonin  26.39 - congested cough and dyspnea - she had diarrhea but this has resolved - cont Maxipime and Doxy, albuterol inhaler. Voltaren gel, cough meds and Dulera - fever 101 2/18- CRP 8> 9 -although she is not wheezing, steroids added to help with the inflammation- added sputum culture 2/20> symptoms unchanged and CRP now 17- CT chest w/o contrast STAT to look for empyema- have asked RN to order that K pad today- she did not give it to the patient yesterday due to a temp of 99 Addendum: CT Chest.  Bilateral pulmonary infiltrates most notably in the right upper lobe. Cont current meds  Active Problems: Elevated T bili - due to sepsis - now 1.6  Hyperglycemia - related to steroids started on 2/19- Novolog ordered  Macrocytic anemia - check B12 and folate  Left conjunctiva injection and edema - this is chronic- have resumed her home eye drops  Hypokalemia, hypomagnesemia - replace as needed  CKD 3b - Cr baseline 1.2- 1.6 - Cr 1.17 now    Anxiety disorder - Xanax QHS    Breast cancer, metastatic   - on immunotherapy - Dr Irene Limbo  Chronic hypotension - hold Midodrine if SBP < 125    GERD (gastroesophageal reflux disease) - pantoprozole   ASD repair Right eye enucleated          Code Status: Full Code Consultants: none Level of Care: Level of care: Stepdown Total time on patient care: 35 DVT prophylaxis:  enoxaparin (LOVENOX) injection 40 mg Start: 07/23/22 1000     Objective:   Vitals:   07/26/22 0600 07/26/22 0800 07/26/22  [id number] 07/26/22 0824  BP: (!) 104/59 (!) 143/77    Pulse: 78 75    Resp: (!) 24 20    Temp:    98.5 F (36.9 C)  TempSrc:    Oral  SpO2: 93% 94% 95%   Weight:      Height:       Filed Weights   07/22/22 2057  Weight: 90.7 kg   Exam: General exam: Appears comfortable  HEENT: oral mucosa moist Respiratory system: rhonchi, congested cough Cardiovascular system: S1 & S2 heard  Gastrointestinal system: Abdomen soft, non-tender,  nondistended. Normal bowel sounds   Extremities: No cyanosis, clubbing or edema Psychiatry:  Mood & affect appropriate.    CBC: Recent Labs  Lab 07/22/22 2300 07/23/22 0917 07/24/22 0250  WBC 11.0* 10.1 7.5  NEUTROABS 8.4* 7.2  --   HGB 11.6* 12.9 10.0*  HCT 35.0* 40.7 30.6*  MCV 107.7* 113.4* 110.1*  PLT 212 189 [id number]    Basic Metabolic Panel: Recent Labs  Lab 07/23/22 0530 07/23/22 1115 07/24/22 0250 07/24/22 0738 07/25/22 0315 07/26/22 0253  NA 144 141 136  --  133* 137  K 2.3* 3.2* 3.2*  --  3.8 4.2  CL 116* 105 100  --  98 101  CO2 18* 27 28  --  26 26  GLUCOSE 70 91 144*  --  121* 240*  BUN 8 11 11  $ --  13 21*  CREATININE 0.95 1.21* 1.22*  --  1.14* 1.17*  CALCIUM 4.7* 8.2* 8.3*  --  8.4* 9.1  MG  --   --   --  1.2*  --   --     GFR: Estimated Creatinine Clearance: 61.7 mL/min (A) (by C-G formula based on SCr of 1.17 mg/dL (H)).  Scheduled Meds:  acetaminophen  1,000 mg Oral TID   albuterol  2.5 mg Nebulization TID   ALPRAZolam  1 mg Oral QHS   amitriptyline  75 mg Oral QHS   vitamin C  1,000 mg Oral Daily   aspirin EC  81 mg Oral Daily   Chlorhexidine Gluconate Cloth  6 each Topical Daily   chlorpheniramine-HYDROcodone  5 mL Oral Q12H   dexamethasone  4 mg Oral Q8H   diclofenac Sodium  2 g Topical QID   enoxaparin (LOVENOX) injection  40 mg Subcutaneous Q24H   ezetimibe  10 mg Oral Daily   feeding supplement  237 mL Oral TID BM   guaiFENesin-dextromethorphan  5 mL Oral QID   insulin aspart  0-15 Units Subcutaneous TID WC   Lifitegrast  1 drop Left Eye BID   midodrine  5 mg Oral TID WC   mometasone-formoterol  2 puff Inhalation BID   pantoprazole  40 mg Oral Daily   polyvinyl alcohol  1 drop Left Eye QID   Continuous Infusions:  ceFEPime (MAXIPIME) IV Stopped (07/26/22 [id number])   doxycycline (VIBRAMYCIN) IV Stopped (07/26/22 0418)   Imaging and lab data was personally reviewed No results found.  LOS: 3 days   Author: Debbe Odea  07/26/2022  10:10 AM  To contact [facility]>   Check the care team in [facility] and look for the attending/consulting [facility] provider listed  Log into [email or web address] and use [place]'s universal password   Go to> "[facility]"  and find provider  If you still have difficulty reaching the provider, please page the [facility] (Director on Call) for the Hospitalists listed on amion

## 2022-07-26 NOTE — TOC Initial Note (Addendum)
Transition of Care [facility]) - Initial/Assessment Note    Patient Details  Name: Brandy [name] MRN: [id number] Date of Birth: [birth year]  Transition of [facility]) CM/SW Contact:    Roseanne Kaufman, RN Phone Number: 07/26/2022, 6:18 PM  Clinical Narrative:   Per chart review patient has been working with outpatient SW for housing assistance and was given a housing and food referral in [place] in 03/2022. Per chart review patient struggles to afford medications due to paying all bills except electricity with her disability check.  This RNCM spoke with patient who reports she has home oxygen with portable tanks and concentrator with [employer]. Patient reports at discharge her daughter can bring her portable oxygen tank. Patient reports she has the following DME: rollator, cane and 4 foot cane. Patient reports she lives with her son( who is her care giver) and husband however is trying to get away from her husband and wants to have her current phone number added to the resources she was previously provided. This RNCM will refer patient for housing and food resources in [facility] and also add additional resources on AVS. Patient is requesting home health services due to her recent dizziness related to her hypotension. Per chart review of [facility]- patient's new mobile phone number was on the initial referral for housing and food resources. TOC will continue to follow discharge needs.  Transportation at discharge: daughter                Expected Discharge Plan: [place] Barriers to Discharge: Continued Medical Work up   Patient Goals and CMS Choice Patient states their goals for this hospitalization and ongoing recovery are:: return home   Choice offered to / list presented to : Patient [place] ownership interest in [name].provided to:: Patient    Expected Discharge Plan and Services In-house Referral: NA Discharge Planning Services: CM  Consult Post Acute Care Choice: [place] arrangements for the past 2 months: Single Family Home                 DME Arranged: N/A DME Agency: NA                  Prior Living Arrangements/Services Living arrangements for the past 2 months: Single Family Home Lives with:: Adult Children, Spouse Patient language and need for interpreter reviewed:: Yes Do you feel safe going back to the place where you live?: Yes      Need for Family Participation in Patient Care: No (Comment) Care giver support system in place?: Yes (comment) Current home services: DME (home oxygen with [employer], DME: rollator, cane, 4 ft cane) Criminal Activity/Legal Involvement Pertinent to Current Situation/Hospitalization: No - Comment as needed  Activities of Daily Living Home Assistive Devices/Equipment: Cane (specify quad or straight), Walker (specify type) ADL Screening (condition at time of admission) Patient's cognitive ability adequate to safely complete daily activities?: No Is the patient deaf or have difficulty hearing?: No Does the patient have difficulty seeing, even when wearing glasses/contacts?: Yes Does the patient have difficulty concentrating, remembering, or making decisions?: No Patient able to express need for assistance with ADLs?: Yes Does the patient have difficulty dressing or bathing?: No Independently performs ADLs?: Yes (appropriate for developmental age)  Permission Sought/Granted Permission sought to share information with : Case Manager Permission granted to share information with : Yes, Verbal Permission Granted  Share Information with NAME: Case manager  Emotional Assessment Appearance:: Appears stated age Attitude/Demeanor/Rapport: Gracious Affect (typically observed): Accepting Orientation: : Oriented to Self, Oriented to Place, Oriented to  Time, Oriented to Situation Alcohol / Substance Use: Not Applicable Psych Involvement: No  (comment)  Admission diagnosis:  Sepsis ([place]) [A41.9] Pneumonia of right middle lobe due to infectious organism [J18.9] Sepsis, due to unspecified organism, unspecified whether acute organ dysfunction present ([place]) [A41.9] COVID-19 [U07.1] Pneumonia [J18.9] Patient Active Problem List   Diagnosis Date Noted   Sepsis ([place]) 07/23/2022   Pneumonia 07/23/2022   Elevated troponin 07/23/2022   COVID-19 virus infection 07/23/2022   Chronic respiratory failure with hypoxia (H[facility]) 05/08/2022   Hypokalemia 04/24/2022   Hypophosphatemia 04/24/2022   Hypomagnesemia 04/24/2022   Orthostatic hypotension 04/24/2022   Hyperbilirubinemia 04/24/2022   Snoring 07/14/2021   Fatigue 07/14/2021   Daytime somnolence 07/14/2021   Status post device closure of ASD 06/03/2021   Secundum ASD 06/03/2021   Decreased diffusion capacity 05/14/2021   Dyspnea 03/04/2021   Restrictive lung disease 04/01/2020   Pulmonary nodules 04/01/2020   Elevated LFTs 03/26/2020   Primary malignant neoplasm of breast with metastasis ([place]) 02/24/2020   Spondylosis without myelopathy or radiculopathy, lumbosacral region 12/31/2019   Chronic low back pain (Bilateral) w/o sciatica 12/31/2019   Chronic hip pain (3ry area of Pain) (Bilateral) (R>L) 11/27/2019   Chronic sacroiliac joint pain (Left) 11/27/2019   Chronic pain syndrome 11/11/2019   Pharmacologic therapy 11/11/2019   Disorder of skeletal system 11/11/2019   Problems influencing health status 11/11/2019   Abnormal MRI, lumbar spine (08/22/2018) 11/11/2019   Chronic low back pain (1ry area of Pain) (Bilateral) w/ sciatica (Bilateral) 11/11/2019   DDD (degenerative disc disease), lumbosacral 11/11/2019   Grade 1 Anterolisthesis (L2-3, L3-4) 11/11/2019   Grade 1 Retrolisthesis (L4-5, L5-S1) 11/11/2019   Lumbar facet hypertrophy (Multilevel) (Bilateral) 11/11/2019   Lumbar facet syndrome (Multilevel) (Bilateral) (R>L) 11/11/2019   Chronic lower extremity pain (2ry area  of Pain) (Bilateral) (R>L) 11/11/2019   Neurogenic pain 11/11/2019   Chronic musculoskeletal pain 11/11/2019   Infiltrating ductal carcinoma of breast ([place]) 10/08/2019   Diarrhea 09/04/2019   Fatty liver 09/04/2019   Nausea & vomiting 09/04/2019   Obesity (BMI 30-39.9) 09/04/2019   GERD (gastroesophageal reflux disease)    Hyperlipidemia    CKD (chronic kidney disease), stage III ([place])    Depression    Malignant hypertension    Transaminitis    Pneumonitis 08/04/2019   Breast cancer ([place]) 08/04/2019   Tachycardia    Ocular proptosis 02/14/2019   PVD (posterior vitreous detachment), left eye 02/14/2019   Chorioretinal scar of left eye 02/14/2019   Vitreous floaters of left eye 02/14/2019   Bilateral leg weakness 01/24/2019   Insomnia 08/02/2017   Low back pain radiating to both legs 08/02/2017   Migraine 08/02/2017   Neuropathy 08/02/2017   Spinal stenosis, 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    Class: Chronic   Left shoulder tendonitis 05/23/2016    Class: Acute   Post laminectomy syndrome 05/23/2016   Anxiety disorder 01/07/2015   PCP:  Sandrea Hughs, NP Pharmacy:   [place] ([place]), [place] - [address] [place]) [place] [address] Phone: [phone] Fax: [phone]     Social Determinants of Health (SDOH) Social History: SDOH Screenings   Food Insecurity: Food Insecurity Present (06/27/2022)  Housing: Medium Risk (06/27/2022)  Transportation Needs: No Transportation Needs (06/27/2022)  Recent Concern: Transportation Needs - Unmet Transportation Needs (04/24/2022)  Utilities: At Risk (06/27/2022)  Depression (PHQ2-9): Low Risk  (06/15/2022)  Financial Resource Strain: High Risk (06/27/2022)  Tobacco Use: Medium Risk (07/23/2022)   SDOH Interventions:     Readmission Risk Interventions    04/26/2022   12:15 PM  Readmission Risk Prevention Plan  Transportation Screening Complete   PCP or Specialist Appt within 5-7 Days Complete  Home Care Screening Complete  Medication Review (RN CM) Complete

## 2022-07-26 NOTE — Discharge Instructions (Signed)
Additional Resources( Please call the following to assist with your housing concerns):  [place]: [phone] Homelessness Prevention ([place]) [place]: [phone]

## 2022-07-27 ENCOUNTER — Other Ambulatory Visit: Payer: Self-pay

## 2022-07-27 DIAGNOSIS — J189 Pneumonia, unspecified organism: Secondary | ICD-10-CM | POA: Diagnosis not present

## 2022-07-27 LAB — MAGNESIUM: Magnesium: 2 mg/dL (ref 1.7–2.4)

## 2022-07-27 LAB — GLUCOSE, CAPILLARY
Glucose-Capillary: 153 mg/dL — ABNORMAL HIGH (ref 70–99)
Glucose-Capillary: 243 mg/dL — ABNORMAL HIGH (ref 70–99)
Glucose-Capillary: 209 mg/dL — ABNORMAL HIGH (ref 70–99)
Glucose-Capillary: 238 mg/dL — ABNORMAL HIGH (ref 70–99)

## 2022-07-27 LAB — COMPREHENSIVE METABOLIC PANEL
ALT: 45 U/L — ABNORMAL HIGH (ref 0–44)
AST: 71 U/L — ABNORMAL HIGH (ref 15–41)
Albumin: 2.7 g/dL — ABNORMAL LOW (ref 3.5–5.0)
Alkaline Phosphatase: 182 U/L — ABNORMAL HIGH (ref 38–126)
Anion gap: 12 (ref 5–15)
BUN: 21 mg/dL — ABNORMAL HIGH (ref 6–20)
CO2: 25 mmol/L (ref 22–32)
Calcium: 9.4 mg/dL (ref 8.9–10.3)
Chloride: 99 mmol/L (ref 98–111)
Creatinine, Ser: 0.98 mg/dL (ref 0.44–1.00)
GFR, Estimated: >60 mL/min (ref >60)
Glucose, Bld: 228 mg/dL — ABNORMAL HIGH (ref 70–99)
Potassium: 3.9 mmol/L (ref 3.5–5.1)
Sodium: 136 mmol/L (ref 135–145)
Total Bilirubin: 1.4 mg/dL — ABNORMAL HIGH (ref 0.3–1.2)
Total Protein: 7.4 g/dL (ref 6.5–8.1)

## 2022-07-27 LAB — CBC
HCT: 32.9 % — ABNORMAL LOW (ref 36.0–46.0)
Hemoglobin: 11 g/dL — ABNORMAL LOW (ref 12.0–15.0)
MCH: 35.6 pg — ABNORMAL HIGH (ref 26.0–34.0)
MCHC: 33.4 g/dL (ref 30.0–36.0)
MCV: 106.5 fL — ABNORMAL HIGH (ref 80.0–100.0)
Platelets: 296 10*3/uL (ref 150–400)
RBC: 3.09 MIL/uL — ABNORMAL LOW (ref 3.87–5.11)
RDW: 13.7 % (ref 11.5–15.5)
WBC: 13.3 10*3/uL — ABNORMAL HIGH (ref 4.0–10.5)
nRBC: 0.2 % (ref 0.0–0.2)

## 2022-07-27 LAB — D-DIMER, QUANTITATIVE: D-Dimer, Quant: 0.76 ug/mL-FEU — ABNORMAL HIGH (ref 0.00–0.50)

## 2022-07-27 LAB — C-REACTIVE PROTEIN: CRP: 10.3 mg/dL — ABNORMAL HIGH (ref <1.0)

## 2022-07-27 MED ORDER — ARFORMOTEROL TARTRATE 15 MCG/2ML IN NEBU
15.0000 ug | INHALATION_SOLUTION | Freq: Two times a day (BID) | RESPIRATORY_TRACT | Status: DC
  Administered 2022-07-27 – 2022-07-29 (×4): 15 ug via RESPIRATORY_TRACT
  Filled 2022-07-27 (×4): qty 2

## 2022-07-27 MED ORDER — REVEFENACIN 175 MCG/3ML IN SOLN
175.0000 ug | Freq: Every day | RESPIRATORY_TRACT | Status: DC
  Administered 2022-07-28 – 2022-07-29 (×2): 175 ug via RESPIRATORY_TRACT
  Filled 2022-07-27 (×2): qty 3

## 2022-07-27 MED ORDER — SODIUM CHLORIDE 3 % IN NEBU
4.0000 mL | INHALATION_SOLUTION | Freq: Every day | RESPIRATORY_TRACT | Status: DC
  Administered 2022-07-28 – 2022-07-29 (×2): 4 mL via RESPIRATORY_TRACT
  Filled 2022-07-27 (×2): qty 4

## 2022-07-27 MED ORDER — DEXAMETHASONE 4 MG PO TABS
4.0000 mg | ORAL_TABLET | Freq: Two times a day (BID) | ORAL | Status: DC
  Administered 2022-07-27 – 2022-07-29 (×4): 4 mg via ORAL
  Filled 2022-07-27: qty 1
  Filled 2022-07-27 (×2): qty 2
  Filled 2022-07-27: qty 1

## 2022-07-27 MED ORDER — SENNOSIDES-DOCUSATE SODIUM 8.6-50 MG PO TABS
1.0000 | ORAL_TABLET | Freq: Every day | ORAL | Status: DC
  Administered 2022-07-27 – 2022-07-28 (×2): 1 via ORAL
  Filled 2022-07-27 (×2): qty 1

## 2022-07-27 MED ORDER — ARFORMOTEROL TARTRATE 15 MCG/2ML IN NEBU: 15.0000 ug | INHALATION_SOLUTION | Freq: Two times a day (BID) | RESPIRATORY_TRACT | Status: DC

## 2022-07-27 MED ORDER — VITAMIN B-12 1000 MCG PO TABS
500.0000 ug | ORAL_TABLET | Freq: Every day | ORAL | Status: DC
  Administered 2022-07-27 – 2022-07-29 (×3): 500 ug via ORAL
  Filled 2022-07-27 (×3): qty 1

## 2022-07-27 MED ORDER — SENNA 8.6 MG PO TABS
1.0000 | ORAL_TABLET | Freq: Once | ORAL | Status: AC | PRN
  Administered 2022-07-27: 8.6 mg via ORAL
  Filled 2022-07-27: qty 1
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
## 2022-07-27 NOTE — Progress Notes (Signed)
PROGRESS NOTE  Brandy [name] [facility]  [id number] DOB: 1966-01-31 DOA: 07/22/2022 PCP: Sandrea Hughs, NP   Brief Narrative: Patient is a 57 year old female with history of chronic respiratory failure 2 L of oxygen, metastatic breast cancer, GERD, anxiety, CKD stage IIIa, chronic hypertension.  Presented to the emergency department with complaints of productive cough, weakness, dizziness.  She was recently diagnosed with COVID about a week ago before admission.  She has shortness of breath or diarrhea.  On presentation, she was found 101.5.  Chest x-ray showed potassium right medial and lower lobes.  COVID test significantly positive.  Elevated procalcitonin.  Patient was started on antibiotics to cover for bacterial pneumonia.  Assessment & Plan:  Principal Problem:   Pneumonia Active Problems:   Anxiety disorder   Breast cancer ([place])   GERD (gastroesophageal reflux disease)   Hyperlipidemia   CKD (chronic kidney disease), stage III (HCC)   Secundum ASD   Chronic respiratory failure with hypoxia (HCC)   Sepsis (HCC)   Elevated troponin   COVID-19 virus infection  Sepsis secondary to pneumonia: Presented with fever, leukocytosis, elevated procalcitonin.  Blood cultures have been negative.  Continue current antibiotics.  Has leukocytosis.  Currently  afebrile.  Multifocal pneumonia/recent COVID/chronic respiratory failure with hypoxia: Presented with cough, shortness of breath, chest pain, diarrhea which is resolved.  CT chest done on 2/20 showed  bilateral pulmonary infiltrates most notably in the right upper lobe.  Continue current antibiotics.  Incentive spirometer, flutter valve.  Continue bronchodilators as needed.Added Candiss Norse, chest physiotherapy, hypertonic nebulization. Patient is on 2 L of oxygen at home and currently on the same.Elevated CRP. On dexamethasone, reported 2 times a day. On her last admission, she was discharged on 2 L of oxygen per minute for  chronic dyspnea.  She was supposed to follow-up with pulmonology, her PFT had shown restrictive defect  Elevated liver enzymes: Elevated liver enzymes elevated.  Most likely secondary to sepsis.  Continue to monitor.  Hyperglycemia: Most likely secondary to steroids.  Continue sliding scale insulin.A1c of 5.3,no H/O DM  Macrocytic anemia:Currently hemoglobin stable.  Low normal vitamin B12, On supplementation.  Folate level normal.  Left conjunctival injection: continue  appropriately eyedrops  CKD stage IIIb: Clinically looks at baseline.  Baseline creatinine ranges from 1.2-1.6  Metastatic breast cancer: Currently on chemotherapy, follows with Dr. Irene Limbo  Anxiety disorder: Xanax nightly  History of GERD: Continue Protonix  Chronic hypotension: Midodrine  Weakness /Debility: Consult PT/OT.  Ambulates with a cane/walker at home          DVT prophylaxis:enoxaparin (LOVENOX) injection 40 mg Start: 07/23/22 1000     Code Status: Full Code  Family Communication: None at the bedside  Patient status: Inpatient  Patient is from : Home  Anticipated discharge to: Home  Estimated DC date: 2 to 3 days   Consultants: None  Procedures: None  Antimicrobials:  Anti-infectives (From admission, onward)    Start     Dose/Rate Route Frequency Ordered Stop   07/24/22 1400  ceFEPIme (MAXIPIME) 2 g in sodium chloride 0.9 % 100 mL IVPB        2 g 200 mL/hr over 30 Minutes Intravenous Every 8 hours 07/24/22 1213     07/23/22 2359  vancomycin (VANCOREADY) IVPB 750 mg/150 mL  Status:  Discontinued        750 mg 150 mL/hr over 60 Minutes Intravenous Every 24 hours 07/23/22 0257 07/23/22 1140   07/23/22 1500  ceFEPIme (MAXIPIME) 2 g in  sodium chloride 0.9 % 100 mL IVPB  Status:  Discontinued        2 g 200 mL/hr over 30 Minutes Intravenous Every 12 hours 07/23/22 0257 07/24/22 1213   07/23/22 0330  doxycycline (VIBRAMYCIN) 100 mg in sodium chloride 0.9 % 250 mL IVPB        100 mg 125  mL/hr over 120 Minutes Intravenous Every 12 hours 07/23/22 0242 07/28/22 0329   07/22/22 2345  ceFEPIme (MAXIPIME) 2 g in sodium chloride 0.9 % 100 mL IVPB        2 g 200 mL/hr over 30 Minutes Intravenous  Once 07/22/22 2341 07/23/22 0345   07/22/22 2345  metroNIDAZOLE (FLAGYL) IVPB 500 mg        500 mg 100 mL/hr over 60 Minutes Intravenous  Once 07/22/22 2341 07/23/22 0120   07/22/22 2345  vancomycin (VANCOCIN) IVPB 1000 mg/200 mL premix  Status:  Discontinued        1,000 mg 200 mL/hr over 60 Minutes Intravenous  Once 07/22/22 2341 07/22/22 2342   07/22/22 2345  vancomycin (VANCOREADY) IVPB 2000 mg/400 mL        2,000 mg 200 mL/hr over 120 Minutes Intravenous  Once 07/22/22 2343 07/23/22 0215       Subjective: Patient seen and examined at bedside today.  Hemodynamically stable.  On 2 L of oxygen.  AFebrile, normotensive.  Lungs are almost clear on auscultation and she was not in respiratory distress but she was complaining of congestion, cough.  Also started having diarrhea since this morning.  Denies abdomen pain, nausea or vomiting  Objective: Vitals:   07/27/22 0600 07/27/22 0704 07/27/22 0752 07/27/22 0800  BP:    (!) 148/85  Pulse: 80 71  91  Resp: (!) 28 12  (!) 23  Temp:   97.6 F (36.4 C)   TempSrc:   Oral   SpO2: 95% 100%  98%  Weight:      Height:        Intake/Output Summary (Last 24 hours) at 07/27/2022 0810 Last data filed at 07/27/2022 [id number] Gross per 24 hour  Intake 753.25 ml  Output 700 ml  Net 53.25 ml   Filed Weights   07/22/22 2057  Weight: 90.7 kg    Examination:  General exam: Overall comfortable, not in distress, obese Respiratory system:  no wheezes or crackles  Cardiovascular system: S1 & S2 heard, RRR.  Gastrointestinal system: Abdomen is nondistended, soft and nontender. Central nervous system: Alert and oriented Extremities: No edema, no clubbing ,no cyanosis Skin: No rashes, no ulcers,no icterus     Data Reviewed: I have personally  reviewed following labs and imaging studies  CBC: Recent Labs  Lab 07/22/22 2300 07/23/22 0917 07/24/22 0250 07/27/22 0257  WBC 11.0* 10.1 7.5 13.3*  NEUTROABS 8.4* 7.2  --   --   HGB 11.6* 12.9 10.0* 11.0*  HCT 35.0* 40.7 30.6* 32.9*  MCV 107.7* 113.4* 110.1* 106.5*  PLT 212 189 192 0000000   Basic Metabolic Panel: Recent Labs  Lab 07/23/22 1115 07/24/22 0250 07/24/22 0738 07/25/22 0315 07/26/22 0253 07/26/22 1206 07/27/22 0257  NA 141 136  --  133* 137  --  136  K 3.2* 3.2*  --  3.8 4.2  --  3.9  CL 105 100  --  98 101  --  99  CO2 27 28  --  26 26  --  25  GLUCOSE 91 144*  --  121* 240*  --  228*  BUN 11 11  --  13 21*  --  21*  CREATININE 1.21* 1.22*  --  1.14* 1.17*  --  0.98  CALCIUM 8.2* 8.3*  --  8.4* 9.1  --  9.4  MG  --   --  1.2*  --   --  1.5* 2.0     Recent Results (from the past 240 hour(s))  Culture, blood (single)     Status: None (Preliminary result)   Collection Time: 07/22/22 11:40 PM   Specimen: BLOOD  Result Value Ref Range Status   Specimen Description   Final    BLOOD LEFT ANTECUBITAL Performed at [facility] [address], [place], [place] [address]    Special Requests   Final    BOTTLES DRAWN AEROBIC AND ANAEROBIC Blood Culture adequate volume Performed at [facility] [address], [place], [place] [address]    Culture   Final    NO GROWTH 4 DAYS Performed at [facility], [place] [address] [place], [place] [address]    Report Status PENDING  Incomplete  MRSA Next Gen by PCR, Nasal     Status: None   Collection Time: 07/23/22  2:42 AM   Specimen: Nasal Mucosa; Nasal Swab  Result Value Ref Range Status   MRSA by PCR Next Gen NOT DETECTED NOT DETECTED Final    Comment: (NOTE) The GeneXpert MRSA Assay (FDA approved for NASAL specimens only), is one component of a comprehensive MRSA colonization surveillance program. It is not intended to diagnose MRSA infection nor to guide or monitor  treatment for MRSA infections. Test performance is not FDA approved in patients less than 50 years old. Performed at [facility], [place] [address] [place],  [address]      Radiology Studies: CT CHEST WO CONTRAST  Result Date: 07/26/2022 CLINICAL DATA:  Chest pain.  Abnormal x-ray. EXAM: CT CHEST WITHOUT CONTRAST TECHNIQUE: Multidetector CT imaging of the chest was performed following the standard protocol without IV contrast. RADIATION DOSE REDUCTION: This exam was performed according to the departmental dose-optimization program which includes automated exposure control, adjustment of the mA and/or kV according to patient size and/or use of iterative reconstruction technique. COMPARISON:  Chest x-ray 07/22/2022 and prior CT scan 07/12/2021. FINDINGS: Cardiovascular: The heart is normal in size. No pericardial effusion. The aorta is normal in caliber. Stable atherosclerotic calcifications. Stable atrial occlusion device. Mediastinum/Nodes: No mediastinal or hilar mass or adenopathy. Small scattered lymph nodes are stable. The esophagus is grossly normal. Lungs/Pleura: Bilateral pulmonary infiltrates most notably in the right upper lobe. No pleural effusions. No pulmonary lesions. The central tracheobronchial tree is unremarkable. Upper Abdomen: No significant upper abdominal findings. Musculoskeletal: No significant bony findings. IMPRESSION: 1. Bilateral pulmonary infiltrates most notably in the right upper lobe. 2. No mediastinal or hilar mass or adenopathy. Electronically Signed   By: Marijo Sanes M.D.   On: 07/26/2022 17:38    Scheduled Meds:  acetaminophen  1,000 mg Oral TID   albuterol  2.5 mg Nebulization TID   ALPRAZolam  1 mg Oral QHS   amitriptyline  75 mg Oral QHS   vitamin C  1,000 mg Oral Daily   aspirin EC  81 mg Oral Daily   Chlorhexidine Gluconate Cloth  6 each Topical Daily   chlorpheniramine-HYDROcodone  5 mL Oral Q12H   dexamethasone  4 mg Oral Q8H    diclofenac Sodium  2 g Topical QID   enoxaparin (LOVENOX) injection  40 mg Subcutaneous Q24H   ezetimibe  10 mg Oral Daily   feeding supplement  237 mL Oral TID BM   guaiFENesin-dextromethorphan  5 mL Oral QID   insulin aspart  0-15 Units Subcutaneous TID WC   Lifitegrast  1 drop Left Eye BID   midodrine  5 mg Oral TID WC   mometasone-formoterol  2 puff Inhalation BID   pantoprazole  40 mg Oral Daily   polyvinyl alcohol  1 drop Left Eye QID   Continuous Infusions:  ceFEPime (MAXIPIME) IV Stopped (07/27/22 0617)   doxycycline (VIBRAMYCIN) IV Stopped (07/27/22 0525)     LOS: 4 days   Shelly Coss, MD [facility] P2/21/2024, 8:10 AM

## 2022-07-27 NOTE — Evaluation (Signed)
Occupational Therapy Evaluation Patient Details Name: Brandy [name] [facility] MRN: [id number] DOB: [birth year] Today's Date: 07/27/2022   History of Present Illness Patient is a 57 year old female with history of chronic respiratory failure 2 L of oxygen, metastatic breast cancer, GERD, anxiety, CKD stage IIIa, chronic hypertension. She has shortness of breath or diarrhea.  Chest x-ray showed pneumonia right medial and lower lobes. COVID test significantly positive   Clinical Impression   Brandy [name] is a 57 year old woman who presents near her baseline - demonstrating ability to perform bed transfers, ambulation in room and ADLs. She reports no significant shortness of breath during evaluation and o2 sats maintained WNL on her baseline 2 L [place]. From a functional standpoint she is doing well and has no OT needs.      Recommendations for follow up therapy are one component of a multi-disciplinary discharge planning process, led by the attending physician.  Recommendations may be updated based on patient status, additional functional criteria and insurance authorization.   Follow Up Recommendations  No OT follow up     Assistance Recommended at Discharge PRN  Patient can return home with the following Assistance with cooking/housework    Functional Status Assessment  Patient has not had a recent decline in their functional status  Equipment Recommendations  None recommended by OT    Recommendations for Other Services       Precautions / Restrictions Precautions Precautions: Fall Restrictions Weight Bearing Restrictions: No      Mobility Bed Mobility Overal bed mobility: Independent                  Transfers Overall transfer level: Modified independent Equipment used: None                      Balance Overall balance assessment: Mild deficits observed, not formally tested                                         ADL either  performed or assessed with clinical judgement   ADL Overall ADL's : At baseline                                             Vision Patient Visual Report: No change from baseline Additional Comments: Has no Right eye     Perception     Praxis      Pertinent Vitals/Pain Pain Assessment Pain Assessment: No/denies pain     Hand Dominance Right   Extremity/Trunk Assessment Upper Extremity Assessment Upper Extremity Assessment: Overall WFL for tasks assessed   Lower Extremity Assessment Lower Extremity Assessment: Defer to PT evaluation   Cervical / Trunk Assessment Cervical / Trunk Assessment: Normal   Communication Communication Communication: No difficulties   Cognition Arousal/Alertness: Awake/alert Behavior During Therapy: WFL for tasks assessed/performed Overall Cognitive Status: Within Functional Limits for tasks assessed                                 General Comments: Very sharpe. Retired Furniture conservator/restorer      Home Living Family/patient expects to  be discharged to:: Private residence Living Arrangements: Spouse/significant other Available Help at Discharge: Family Type of Home: House Home Access: Stairs to enter [employer] of Steps: "too many" Entrance Stairs-Rails: Right;Left Home Layout: Two level;Able to live on main level with bedroom/bathroom     Bathroom Shower/Tub: Teacher, early years/pre: Standard Bathroom Accessibility: Yes How Accessible: Accessible via walker Home Equipment: [place] (2 wheels);BSC/3in1;Cane - single point          Prior Functioning/Environment Prior Level of Function : Independent/Modified Independent             Mobility Comments: uses RW vs can PRN          OT Problem List: Cardiopulmonary status limiting activity      OT Treatment/Interventions:      OT Goals(Current goals can be  found in the care plan section) Acute Rehab OT Goals OT Goal Formulation: All assessment and education complete, DC therapy  OT Frequency:      Co-evaluation              AM-PAC OT "6 Clicks" Daily Activity     Outcome Measure Help from another person eating meals?: None Help from another person taking care of personal grooming?: None Help from another person toileting, which includes using toliet, bedpan, or urinal?: None Help from another person bathing (including washing, rinsing, drying)?: None Help from another person to put on and taking off regular upper body clothing?: None Help from another person to put on and taking off regular lower body clothing?: None 6 Click Score: 24   End of Session Equipment Utilized During Treatment: Oxygen Nurse Communication: Mobility status  Activity Tolerance: Patient tolerated treatment well Patient left: in bed;with call bell/phone within reach;with nursing/sitter in room  OT Visit Diagnosis: Muscle weakness (generalized) (M62.81)                Time: [id number] OT Time Calculation (min): 33 min Charges:  OT General Charges $OT Visit: 1 Visit OT Evaluation $OT Eval Low Complexity: 1 Low  Gustavo Lah, OTR/L [place]  Office [phone]   Lenward Chancellor 07/27/2022, 4:33 PM

## 2022-07-27 NOTE — Inpatient Diabetes Management (Signed)
Inpatient Diabetes Program Recommendations  AACE/ADA: New Consensus Statement on Inpatient Glycemic Control (2015)  Target Ranges:  Prepandial:   less than 140 mg/dL      Peak postprandial:   less than 180 mg/dL (1-2 hours)      Critically ill patients:  140 - 180 mg/dL   Lab Results  Component Value Date   GLUCAP 153 (H) 07/27/2022   HGBA1C 5.3 07/26/2022    Review of Glycemic Control  Latest Reference Range & Units 07/26/22 08:19 07/26/22 11:15 07/26/22 16:25 07/26/22 21:28 07/27/22 07:55  Glucose-Capillary 70 - 99 mg/dL 210 (H)  Novolog 5 units 171 (H)  Novolog 3 units 216 (H)  Novolog 5 units 214 (H) 153 (H)  Novolog 3 units   Diabetes history: None  Current orders for Inpatient glycemic control:  Novolog 0-15 units tid   Decadron 4 mg Q12 hours Ensure Enlive bid between meals (40 gram of carbohydrates)  Inpatient Diabetes Program Recommendations:    -  Add Novolog 3 units tid meal coverage if eating >50% of meals -  Add Novolog hs scale  Thanks,  Tama Headings RN, MSN, BC-ADM Inpatient Diabetes Coordinator Team Pager [phone] (8a-5p)

## 2022-07-28 ENCOUNTER — Encounter (HOSPITAL_COMMUNITY): Payer: Medicare Other

## 2022-07-28 LAB — GLUCOSE, CAPILLARY
Glucose-Capillary: 127 mg/dL — ABNORMAL HIGH (ref 70–99)
Glucose-Capillary: 87 mg/dL (ref 70–99)
Glucose-Capillary: 131 mg/dL — ABNORMAL HIGH (ref 70–99)
Glucose-Capillary: 122 mg/dL — ABNORMAL HIGH (ref 70–99)

## 2022-07-28 LAB — COMPREHENSIVE METABOLIC PANEL
ALT: 53 U/L — ABNORMAL HIGH (ref 0–44)
AST: 69 U/L — ABNORMAL HIGH (ref 15–41)
Albumin: 2.2 g/dL — ABNORMAL LOW (ref 3.5–5.0)
Alkaline Phosphatase: 173 U/L — ABNORMAL HIGH (ref 38–126)
Anion gap: 9 (ref 5–15)
BUN: 23 mg/dL — ABNORMAL HIGH (ref 6–20)
CO2: 27 mmol/L (ref 22–32)
Calcium: 9 mg/dL (ref 8.9–10.3)
Chloride: 103 mmol/L (ref 98–111)
Creatinine, Ser: 0.93 mg/dL (ref 0.44–1.00)
GFR, Estimated: >60 mL/min (ref >60)
Glucose, Bld: 154 mg/dL — ABNORMAL HIGH (ref 70–99)
Potassium: 3.3 mmol/L — ABNORMAL LOW (ref 3.5–5.1)
Sodium: 139 mmol/L (ref 135–145)
Total Bilirubin: 1 mg/dL (ref 0.3–1.2)
Total Protein: 6.1 g/dL — ABNORMAL LOW (ref 6.5–8.1)

## 2022-07-28 LAB — CBC
HCT: 29.9 % — ABNORMAL LOW (ref 36.0–46.0)
Hemoglobin: 10.2 g/dL — ABNORMAL LOW (ref 12.0–15.0)
MCH: 35.7 pg — ABNORMAL HIGH (ref 26.0–34.0)
MCHC: 34.1 g/dL (ref 30.0–36.0)
MCV: 104.5 fL — ABNORMAL HIGH (ref 80.0–100.0)
Platelets: 306 10*3/uL (ref 150–400)
RBC: 2.86 MIL/uL — ABNORMAL LOW (ref 3.87–5.11)
RDW: 13.6 % (ref 11.5–15.5)
WBC: 11.6 10*3/uL — ABNORMAL HIGH (ref 4.0–10.5)
nRBC: 0.6 % — ABNORMAL HIGH (ref 0.0–0.2)

## 2022-07-28 LAB — CULTURE, BLOOD (SINGLE)
Culture: NO GROWTH
Special Requests: ADEQUATE

## 2022-07-28 LAB — C-REACTIVE PROTEIN: CRP: 5.6 mg/dL — ABNORMAL HIGH (ref <1.0)

## 2022-07-28 MED ORDER — INSULIN ASPART 100 UNIT/ML IJ SOLN
3.0000 [IU] | Freq: Three times a day (TID) | INTRAMUSCULAR | Status: DC
  Administered 2022-07-29: 3 [IU] via SUBCUTANEOUS

## 2022-07-28 MED ORDER — POTASSIUM CHLORIDE CRYS ER 20 MEQ PO TBCR
40.0000 meq | EXTENDED_RELEASE_TABLET | Freq: Once | ORAL | Status: AC
  Administered 2022-07-28: 40 meq via ORAL
  Filled 2022-07-28: qty 2

## 2022-07-28 NOTE — Progress Notes (Signed)
Pt trying to nap. She stated she would perform her flutter 10x on her own when she wakes up for her 1600 CPT.

## 2022-07-28 NOTE — Progress Notes (Signed)
PT Cancellation Note  Patient Details Name: Brandy [name] MRN: [id number] DOB: 08-Apr-1966   Cancelled Treatment:    Reason Eval/Treat Not Completed: Other (comment) re-attempted after transfer to floor but pt not available at this time. Will attempt to return later if time/schedule allow.   Deniece Ree PT DPT PN2

## 2022-07-28 NOTE — Progress Notes (Signed)
PROGRESS NOTE  Brandy [name] [facility]  [id number] DOB: 1965/11/29 DOA: 07/22/2022 PCP: Sandrea Hughs, NP   Brief Narrative: Patient is a 57 year old female with history of chronic respiratory failure 2 L of oxygen, metastatic breast cancer, GERD, anxiety, CKD stage IIIa, chronic hypertension.  Presented to the emergency department with complaints of productive cough, weakness, dizziness.  She was recently diagnosed with COVID about a week ago before admission.  She has shortness of breath or diarrhea.  On presentation, she was found to have fever of 101.5.  Chest x-ray showed infiltrates on   right medial and lower lobes.  COVID test was  positive.  Elevated procalcitonin.  Patient was started on antibiotics to cover for bacterial pneumonia. Overall status is significantly better.  She was on room air today.  Possible plan for discharge tomorrow after physical therapy assessment  Assessment & Plan:  Principal Problem:   Pneumonia Active Problems:   Anxiety disorder   Breast cancer ([place])   GERD (gastroesophageal reflux disease)   Hyperlipidemia   CKD (chronic kidney disease), stage III (HCC)   Secundum ASD   Chronic respiratory failure with hypoxia (HCC)   Sepsis (HCC)   Elevated troponin   COVID-19 virus infection  Sepsis secondary to pneumonia: Presented with fever, leukocytosis, elevated procalcitonin.  Blood cultures have been negative.  Continue current antibiotics: Will stop after  5 days course.  Improving leukocytosis.  Currently  afebrile.  Multifocal pneumonia/recent COVID/chronic respiratory failure with hypoxia: Presented with cough, shortness of breath, chest pain, diarrhea which is resolved.  CT chest done on 2/20 showed  bilateral pulmonary infiltrates most notably in the right upper lobe.    Incentive spirometer, flutter valve.  Continue bronchodilators as needed.Added Candiss Norse, chest physiotherapy, hypertonic nebulization. Patient is on 2 L of oxygen at  home and currently on the same/room air .Elevated CRP but improving now. On dexamethasone, tapered to  2 times a day. On her last admission, she was discharged on 2 L of oxygen per minute for chronic dyspnea.  She was supposed to follow-up with pulmonology, her PFT had shown restrictive defect  Elevated liver enzymes: Elevated liver enzymes elevated.  Most likely secondary to sepsis,improving.  Continue to monitor.  Hyperglycemia: Most likely secondary to steroids.  Continue sliding scale insulin.A1c of 5.3,no H/O DM  Macrocytic anemia:Currently hemoglobin stable.  Low normal vitamin B12, On supplementation.  Folate level normal.  Left conjunctival injection: continue   eyedrops  CKD stage IIIb: Clinically looks at baseline.  Baseline creatinine ranges from 1.2-1.6  Metastatic breast cancer: Currently on chemotherapy, follows with Dr. Irene Limbo  Anxiety disorder: Xanax nightly  Hypokalemia: Supplemented with potassium  History of GERD: Continue Protonix  Chronic hypotension: Midodrine  Weakness /Debility: Consult PT/OT.  Ambulates with a cane/walker at home          DVT prophylaxis:enoxaparin (LOVENOX) injection 40 mg Start: 07/23/22 1000     Code Status: Full Code  Family Communication: None at the bedside  Patient status: Inpatient  Patient is from : Home  Anticipated discharge to: Home  Estimated DC date: Tomorrow   Consultants: None  Procedures: None  Antimicrobials:  Anti-infectives (From admission, onward)    Start     Dose/Rate Route Frequency Ordered Stop   07/24/22 1400  ceFEPIme (MAXIPIME) 2 g in sodium chloride 0.9 % 100 mL IVPB        2 g 200 mL/hr over 30 Minutes Intravenous Every 8 hours 07/24/22 1213     07/23/22  2359  vancomycin (VANCOREADY) IVPB 750 mg/150 mL  Status:  Discontinued        750 mg 150 mL/hr over 60 Minutes Intravenous Every 24 hours 07/23/22 0257 07/23/22 1140   07/23/22 1500  ceFEPIme (MAXIPIME) 2 g in sodium chloride 0.9 % 100  mL IVPB  Status:  Discontinued        2 g 200 mL/hr over 30 Minutes Intravenous Every 12 hours 07/23/22 0257 07/24/22 1213   07/23/22 0330  doxycycline (VIBRAMYCIN) 100 mg in sodium chloride 0.9 % 250 mL IVPB        100 mg 125 mL/hr over 120 Minutes Intravenous Every 12 hours 07/23/22 0242 07/27/22 1707   07/22/22 2345  ceFEPIme (MAXIPIME) 2 g in sodium chloride 0.9 % 100 mL IVPB        2 g 200 mL/hr over 30 Minutes Intravenous  Once 07/22/22 2341 07/23/22 0345   07/22/22 2345  metroNIDAZOLE (FLAGYL) IVPB 500 mg        500 mg 100 mL/hr over 60 Minutes Intravenous  Once 07/22/22 2341 07/23/22 0120   07/22/22 2345  vancomycin (VANCOCIN) IVPB 1000 mg/200 mL premix  Status:  Discontinued        1,000 mg 200 mL/hr over 60 Minutes Intravenous  Once 07/22/22 2341 07/22/22 2342   07/22/22 2345  vancomycin (VANCOREADY) IVPB 2000 mg/400 mL        2,000 mg 200 mL/hr over 120 Minutes Intravenous  Once 07/22/22 2343 07/23/22 0215       Subjective: Patient seen and examined at bedside today.  Looks better today.  On room air, maintaining saturation.  Having some productive cough but not worsening shortness of breath.  Feels much better today.  Objective: Vitals:   07/28/22 0600 07/28/22 0744 07/28/22 0808 07/28/22 0936  BP: (!) 173/62 (!) 161/76  (!) 150/64  Pulse: 66 78  79  Resp: 12 20  17  $ Temp:  98.9 F (37.2 C)    TempSrc:  Oral    SpO2: 95% 96% 100% 96%  Weight:      Height:        Intake/Output Summary (Last 24 hours) at 07/28/2022 1141 Last data filed at 07/28/2022 [id number] Gross per 24 hour  Intake 349.83 ml  Output 1500 ml  Net -1150.17 ml   Filed Weights   07/22/22 2057  Weight: 90.7 kg    Examination:   General exam: Overall comfortable, not in distress HEENT: right eye absent Respiratory system:  no wheezes or crackles  Cardiovascular system: S1 & S2 heard, RRR.  Gastrointestinal system: Abdomen is nondistended, soft and nontender. Central nervous system: Alert and  oriented Extremities: No edema, no clubbing ,no cyanosis Skin: No rashes, no ulcers,no icterus     Data Reviewed: I have personally reviewed following labs and imaging studies  CBC: Recent Labs  Lab 07/22/22 2300 07/23/22 0917 07/24/22 0250 07/27/22 0257 07/28/22 0249  WBC 11.0* 10.1 7.5 13.3* 11.6*  NEUTROABS 8.4* 7.2  --   --   --   HGB 11.6* 12.9 10.0* 11.0* 10.2*  HCT 35.0* 40.7 30.6* 32.9* 29.9*  MCV 107.7* 113.4* 110.1* 106.5* 104.5*  PLT 212 189 192 296 [id number]   Basic Metabolic Panel: Recent Labs  Lab 07/24/22 0250 07/24/22 0738 07/25/22 0315 07/26/22 0253 07/26/22 1206 07/27/22 0257 07/28/22 0249  NA 136  --  133* 137  --  136 139  K 3.2*  --  3.8 4.2  --  3.9 3.3*  CL 100  --  98 101  --  99 103  CO2 28  --  26 26  --  25 27  GLUCOSE 144*  --  121* 240*  --  228* 154*  BUN 11  --  13 21*  --  21* 23*  CREATININE 1.22*  --  1.14* 1.17*  --  0.98 0.93  CALCIUM 8.3*  --  8.4* 9.1  --  9.4 9.0  MG  --  1.2*  --   --  1.5* 2.0  --      Recent Results (from the past 240 hour(s))  Culture, blood (single)     Status: None   Collection Time: 07/22/22 11:40 PM   Specimen: BLOOD  Result Value Ref Range Status   Specimen Description   Final    BLOOD LEFT ANTECUBITAL Performed at [facility], [place] [address] [place], [place] [address]    Special Requests   Final    BOTTLES DRAWN AEROBIC AND ANAEROBIC Blood Culture adequate volume Performed at [place] [address], [place], [place] [address]    Culture   Final    NO GROWTH 5 DAYS Performed at [facility], [place] [address] [place], [place] [address]    Report Status 07/28/2022 FINAL  Final  MRSA Next Gen by PCR, Nasal     Status: None   Collection Time: 07/23/22  2:42 AM   Specimen: Nasal Mucosa; Nasal Swab  Result Value Ref Range Status   MRSA by PCR Next Gen NOT DETECTED NOT DETECTED Final    Comment: (NOTE) The GeneXpert MRSA Assay (FDA approved for  NASAL specimens only), is one component of a comprehensive MRSA colonization surveillance program. It is not intended to diagnose MRSA infection nor to guide or monitor treatment for MRSA infections. Test performance is not FDA approved in patients less than 52 years old. Performed at [facility], [place] [address], [place], [place] [address]      Radiology Studies: CT CHEST WO CONTRAST  Result Date: 07/26/2022 CLINICAL DATA:  Chest pain.  Abnormal x-ray. EXAM: CT CHEST WITHOUT CONTRAST TECHNIQUE: Multidetector CT imaging of the chest was performed following the standard protocol without IV contrast. RADIATION DOSE REDUCTION: This exam was performed according to the departmental dose-optimization program which includes automated exposure control, adjustment of the mA and/or kV according to patient size and/or use of iterative reconstruction technique. COMPARISON:  Chest x-ray 07/22/2022 and prior CT scan 07/12/2021. FINDINGS: Cardiovascular: The heart is normal in size. No pericardial effusion. The aorta is normal in caliber. Stable atherosclerotic calcifications. Stable atrial occlusion device. Mediastinum/Nodes: No mediastinal or hilar mass or adenopathy. Small scattered lymph nodes are stable. The esophagus is grossly normal. Lungs/Pleura: Bilateral pulmonary infiltrates most notably in the right upper lobe. No pleural effusions. No pulmonary lesions. The central tracheobronchial tree is unremarkable. Upper Abdomen: No significant upper abdominal findings. Musculoskeletal: No significant bony findings. IMPRESSION: 1. Bilateral pulmonary infiltrates most notably in the right upper lobe. 2. No mediastinal or hilar mass or adenopathy. Electronically Signed   By: Marijo Sanes M.D.   On: 07/26/2022 17:38    Scheduled Meds:  acetaminophen  1,000 mg Oral TID   ALPRAZolam  1 mg Oral QHS   amitriptyline  75 mg Oral QHS   arformoterol  15 mcg Nebulization BID   vitamin C  1,000 mg Oral  Daily   aspirin EC  81 mg Oral Daily   Chlorhexidine Gluconate Cloth  6 each  Topical Daily   chlorpheniramine-HYDROcodone  5 mL Oral Q12H   vitamin B-12  500 mcg Oral Daily   dexamethasone  4 mg Oral Q12H   diclofenac Sodium  2 g Topical QID   enoxaparin (LOVENOX) injection  40 mg Subcutaneous Q24H   ezetimibe  10 mg Oral Daily   feeding supplement  237 mL Oral TID BM   guaiFENesin-dextromethorphan  5 mL Oral QID   insulin aspart  0-15 Units Subcutaneous TID WC   insulin aspart  3 Units Subcutaneous TID WC   Lifitegrast  1 drop Left Eye BID   midodrine  5 mg Oral TID WC   pantoprazole  40 mg Oral Daily   polyvinyl alcohol  1 drop Left Eye QID   revefenacin  175 mcg Nebulization Daily   senna-docusate  1 tablet Oral QHS   sodium chloride HYPERTONIC  4 mL Nebulization Daily   Continuous Infusions:  ceFEPime (MAXIPIME) IV Stopped (07/28/22 0659)     LOS: 5 days   Shelly Coss, MD [facility] P2/22/2024, 11:41 AM

## 2022-07-28 NOTE — Progress Notes (Signed)
PT Cancellation Note  Patient Details Name: Brandy [name] MRN: [id number] DOB: 1966-05-12   Cancelled Treatment:    Reason Eval/Treat Not Completed: Other (comment) per RN pt actively being transferred to another floor, will attempt to return later if time/schedule allow.   Deniece Ree PT DPT PN2

## 2022-07-29 LAB — BASIC METABOLIC PANEL
Anion gap: 10 (ref 5–15)
BUN: 33 mg/dL — ABNORMAL HIGH (ref 6–20)
CO2: 24 mmol/L (ref 22–32)
Calcium: 8.7 mg/dL — ABNORMAL LOW (ref 8.9–10.3)
Chloride: 101 mmol/L (ref 98–111)
Creatinine, Ser: 0.89 mg/dL (ref 0.44–1.00)
GFR, Estimated: >60 mL/min (ref >60)
Glucose, Bld: 161 mg/dL — ABNORMAL HIGH (ref 70–99)
Potassium: 3.8 mmol/L (ref 3.5–5.1)
Sodium: 135 mmol/L (ref 135–145)

## 2022-07-29 LAB — GLUCOSE, CAPILLARY
Glucose-Capillary: 136 mg/dL — ABNORMAL HIGH (ref 70–99)
Glucose-Capillary: 85 mg/dL (ref 70–99)

## 2022-07-29 MED ORDER — DEXAMETHASONE 4 MG PO TABS: 4.0000 mg | ORAL_TABLET | Freq: Two times a day (BID) | ORAL | 0 refills | Status: AC

## 2022-07-29 MED ORDER — CYANOCOBALAMIN 500 MCG PO TABS: 500.0000 ug | ORAL_TABLET | Freq: Every day | ORAL | 0 refills | Status: AC

## 2022-07-29 NOTE — Discharge Summary (Signed)
Physician Discharge Summary  SAVITRI PETROW [facility] [id number] DOB: May 06, 1966 DOA: 07/22/2022  PCP: Sandrea Hughs, NP  Admit date: 07/22/2022 Discharge date: 07/29/2022  Admitted From: Home Disposition:  Home  Discharge Condition:Stable CODE STATUS:FULL Diet recommendation: regular  Brief/Interim Summary:  Patient is a 57 year old female with history of chronic respiratory failure 2 L of oxygen, metastatic breast cancer, GERD, anxiety, CKD stage IIIa, chronic hypertension.  Presented to the emergency department with complaints of productive cough, weakness, dizziness.  She was recently diagnosed with COVID about a week ago before admission.  She has shortness of breath or diarrhea.  On presentation, she was found to have fever of 101.5.  Chest x-ray showed infiltrates on   right medial and lower lobes.  COVID test was  positive.  Elevated procalcitonin.  Patient was started on antibiotics to cover for bacterial pneumonia.She completed antibiotics course.  PT recommended home health on discharge.  Currently her respiratory status stable and she is on 2 L of oxygen per minute which is her baseline.  Medically stable for discharge home today.    Following problems were addressed during the hospitalization:  Sepsis secondary to pneumonia: Presented with fever, leukocytosis, elevated procalcitonin.  Blood cultures have been negative.  Completed antibiotics course.  Leukocytosis improved.  Currently  afebrile.   Multifocal pneumonia/recent COVID/chronic respiratory failure with hypoxia: Presented with cough, shortness of breath, chest pain, diarrhea which is resolved.  CT chest done on 2/20 showed  bilateral pulmonary infiltrates most notably in the right upper lobe.    Treated with  Candiss Norse, chest physiotherapy, hypertonic nebulization. Patient is on 2 L of oxygen at home and currently on the same/room air .Elevated CRP but improving now. On dexamethasone, tapered . On her  last admission, she was discharged on 2 L of oxygen per minute for chronic dyspnea.  She was supposed to follow-up with pulmonology, her PFT had shown restrictive defect   Elevated liver enzymes: Elevated liver enzymes elevated.  Most likely secondary to sepsis,improved.   Hyperglycemia: Most likely secondary to steroids.  Continue sliding scale insulin.A1c of 5.3,no H/O DM   Macrocytic anemia:Currently hemoglobin stable.  Low normal vitamin B12, On supplementation.  Folate level normal.   CKD stage IIIb: Clinically looks at baseline.  Baseline creatinine ranges from 1.2-1.6   Metastatic breast cancer: Currently on chemotherapy, follows with Dr. Irene Limbo   Anxiety disorder: Xanax nightly   Hypokalemia: Supplemented with potassium   History of GERD: Continue Protonix   Chronic hypotension: Midodrine   Weakness /Debility: PT recommended home health on discharge  Discharge Diagnoses:  Principal Problem:   Pneumonia Active Problems:   Anxiety disorder   Breast cancer ([place])   GERD (gastroesophageal reflux disease)   Hyperlipidemia   CKD (chronic kidney disease), stage III ([place])   Secundum ASD   Chronic respiratory failure with hypoxia (HCC)   Sepsis (HCC)   Elevated troponin   COVID-19 virus infection    Discharge Instructions  Discharge Instructions     Diet - low sodium heart healthy   Complete by: As directed    Discharge instructions   Complete by: As directed    1)Please take prescribed medications as instructed 2)Follow up with your PCP in a week   Increase activity slowly   Complete by: As directed       Allergies as of 07/29/2022       Reactions   Zithromax [azithromycin] Shortness Of Breath, Itching, Other (See Comments)   TOTAL BODY ITCHING [EVEN  SOLES OF FEET] WHEEZING   Tramadol Itching, Other (See Comments)   Has taken recently without any side effects.   Psyllium Nausea And Vomiting, Other (See Comments)   Metamucil. Sneezing         Medication  List     STOP taking these medications    potassium chloride SA 20 MEQ tablet Commonly known as: KLOR-CON M   traMADol 100 MG 24 hr tablet Commonly known as: ULTRAM-ER   vitamin C 1000 MG tablet   zinc gluconate 50 MG tablet       TAKE these medications    abemaciclib 100 MG tablet Commonly known as: Verzenio Take 1 tablet (100 mg total) by mouth 2 (two) times daily.   acetaminophen 500 MG tablet Commonly known as: TYLENOL Take 500 mg by mouth daily as needed for moderate pain.   albuterol (2.5 MG/3ML) 0.083% nebulizer solution Commonly known as: PROVENTIL Take 3 mLs (2.5 mg total) by nebulization every 6 (six) hours as needed for wheezing or shortness of breath.   albuterol 108 (90 Base) MCG/ACT inhaler Commonly known as: VENTOLIN HFA Inhale 2 puffs into the lungs every 6 (six) hours as needed for wheezing or shortness of breath.   ALPRAZolam 1 MG tablet Commonly known as: XANAX Take 1 tablet (1 mg total) by mouth at bedtime.   amitriptyline 75 MG tablet Commonly known as: ELAVIL TAKE 1 TABLET BY MOUTH AT BEDTIME   Belbuca 300 MCG Film Generic drug: Buprenorphine HCl Place 300 mcg inside cheek every 12 (twelve) hours.   cyanocobalamin 500 MCG tablet Commonly known as: VITAMIN B12 Take 1 tablet (500 mcg total) by mouth daily. Start taking on: July 30, 2022   dexamethasone 4 MG tablet Commonly known as: DECADRON Take 1 tablet (4 mg total) by mouth every 12 (twelve) hours for 4 days. Take 2 tablets  today then continue taking 1 tablet daily from tomorrow  for 3 days then stop   diphenoxylate-atropine 2.5-0.025 MG tablet Commonly known as: LOMOTIL Take 1 tablet by mouth 3 (three) times daily as needed for diarrhea or loose stools.   ezetimibe 10 MG tablet Commonly known as: Zetia Take 1 tablet (10 mg total) by mouth daily.   fluticasone-salmeterol 250-50 MCG/ACT Aepb Commonly known as: ADVAIR INHALE 1 INHALATION BY MOUTH IN  THE MORNING AND AT  BEDTIME What changed: See the new instructions.   loperamide 2 MG capsule Commonly known as: IMODIUM Take 1-2 capsules (2-4 mg total) by mouth 4 (four) times daily as needed for diarrhea or loose stools. What changed:  how much to take when to take this   Lubricant Eye Nighttime Oint Place 1 drop into the left eye at bedtime as needed (dryness).   LUBRICATING EYE DROPS OP Place 1 drop into the right eye in the morning, at noon, and at bedtime.   methocarbamol 500 MG tablet Commonly known as: ROBAXIN Take 1 tablet (500 mg total) by mouth every 6 (six) hours as needed for muscle spasms. What changed: when to take this   midodrine 5 MG tablet Commonly known as: PROAMATINE Take 1 tablet (5 mg total) by mouth 3 (three) times daily with meals. What changed: Another medication with the same name was removed. Continue taking this medication, and follow the directions you see here.   multivitamin with minerals Tabs tablet Take 1 tablet by mouth daily.   omeprazole 20 MG tablet Commonly known as: PRILOSEC OTC Take 20 mg by mouth daily.   PRESCRIPTION MEDICATION CPAP-  At bedtime   triamcinolone cream 0.1 % Commonly known as: KENALOG Apply 1 Application topically 2 (two) times daily.   Vitamin D (Ergocalciferol) 1.25 MG (50000 UNIT) Caps capsule Commonly known as: DRISDOL TAKE 1 CAPSULE ONCE PER WEEK What changed:  how much to take how to take this when to take this additional instructions   VITAMIN D3 PO Take 1 tablet by mouth daily.   Xiidra 5 % Soln Generic drug: Lifitegrast Place 1 drop into the left eye 2 (two) times daily.        Follow-up Information     Ngetich, Dinah C, NP. Schedule an appointment as soon as possible for a visit in 1 week(s).   Specialty: Family Medicine Contact information: [place] [id number] [phone]                Allergies  Allergen Reactions   Zithromax [Azithromycin] Shortness Of Breath, Itching and  Other (See Comments)    TOTAL BODY ITCHING [EVEN SOLES OF FEET] WHEEZING    Tramadol Itching and Other (See Comments)    Has taken recently without any side effects.   Psyllium Nausea And Vomiting and Other (See Comments)    Metamucil. Sneezing      Consultations: None   Procedures/Studies: CT CHEST WO CONTRAST  Result Date: 07/26/2022 CLINICAL DATA:  Chest pain.  Abnormal x-ray. EXAM: CT CHEST WITHOUT CONTRAST TECHNIQUE: Multidetector CT imaging of the chest was performed following the standard protocol without IV contrast. RADIATION DOSE REDUCTION: This exam was performed according to the departmental dose-optimization program which includes automated exposure control, adjustment of the mA and/or kV according to patient size and/or use of iterative reconstruction technique. COMPARISON:  Chest x-ray 07/22/2022 and prior CT scan 07/12/2021. FINDINGS: Cardiovascular: The heart is normal in size. No pericardial effusion. The aorta is normal in caliber. Stable atherosclerotic calcifications. Stable atrial occlusion device. Mediastinum/Nodes: No mediastinal or hilar mass or adenopathy. Small scattered lymph nodes are stable. The esophagus is grossly normal. Lungs/Pleura: Bilateral pulmonary infiltrates most notably in the right upper lobe. No pleural effusions. No pulmonary lesions. The central tracheobronchial tree is unremarkable. Upper Abdomen: No significant upper abdominal findings. Musculoskeletal: No significant bony findings. IMPRESSION: 1. Bilateral pulmonary infiltrates most notably in the right upper lobe. 2. No mediastinal or hilar mass or adenopathy. Electronically Signed   By: Marijo Sanes M.D.   On: 07/26/2022 17:38   ECHOCARDIOGRAM COMPLETE  Result Date: 07/23/2022    ECHOCARDIOGRAM REPORT   Patient Name:   TYESE GYAMFI Date of Exam: 07/23/2022 Medical Rec #:  [id number]                    Height:       68.0 in Accession #:    [id number]                   Weight:        200.0 lb Date of Birth:  April 13, 1966                    BSA:          2.044 m Patient Age:    26 years                     BP:           137/65 mmHg Patient Gender: F  HR:           86 bpm. Exam Location:  Inpatient Procedure: 2D Echo and Intracardiac Opacification Agent Indications:    elevated troponin  History:        Patient has prior history of Echocardiogram examinations, most                 recent 06/03/2021. Arrythmias:Tachycardia; Risk                 Factors:Dyslipidemia.  Sonographer:    Harvie Junior Referring Phys: [place]  Sonographer Comments: Technically difficult study due to poor echo windows and patient is obese. Image acquisition challenging due to patient body habitus and Image acquisition challenging due to uncooperative patient. IMPRESSIONS  1. Left ventricular ejection fraction, by estimation, is 60 to 65%. The left ventricle has normal function. The left ventricle has no regional wall motion abnormalities. Left ventricular diastolic parameters are consistent with Grade I diastolic dysfunction (impaired relaxation).  2. Right ventricular systolic function is normal. The right ventricular size is normal. Tricuspid regurgitation signal is inadequate for assessing PA pressure.  3. The mitral valve is normal in structure. Trivial mitral valve regurgitation. No evidence of mitral stenosis.  4. The aortic valve is tricuspid. Aortic valve regurgitation is not visualized. Aortic valve sclerosis is present, with no evidence of aortic valve stenosis.  5. The inferior vena cava is normal in size with greater than 50% respiratory variability, suggesting right atrial pressure of 3 mmHg. FINDINGS  Left Ventricle: Left ventricular ejection fraction, by estimation, is 60 to 65%. The left ventricle has normal function. The left ventricle has no regional wall motion abnormalities. Definity contrast agent was given IV to delineate the left ventricular  endocardial  borders. The left ventricular internal cavity size was normal in size. There is no left ventricular hypertrophy. Left ventricular diastolic parameters are consistent with Grade I diastolic dysfunction (impaired relaxation). Right Ventricle: The right ventricular size is normal. Right ventricular systolic function is normal. Tricuspid regurgitation signal is inadequate for assessing PA pressure. The tricuspid regurgitant velocity is 2.40 m/s, and with an assumed right atrial  pressure of 3 mmHg, the estimated right ventricular systolic pressure is [id number] mmHg. Left Atrium: Left atrial size was normal in size. Right Atrium: Right atrial size was normal in size. Pericardium: There is no evidence of pericardial effusion. Mitral Valve: The mitral valve is normal in structure. Trivial mitral valve regurgitation. No evidence of mitral valve stenosis. Tricuspid Valve: The tricuspid valve is normal in structure. Tricuspid valve regurgitation is trivial. No evidence of tricuspid stenosis. Aortic Valve: The aortic valve is tricuspid. Aortic valve regurgitation is not visualized. Aortic valve sclerosis is present, with no evidence of aortic valve stenosis. Aortic valve mean gradient measures 4.0 mmHg. Aortic valve peak gradient measures 7.4  mmHg. Aortic valve area, by VTI measures 2.02 cm. Pulmonic Valve: The pulmonic valve was normal in structure. Pulmonic valve regurgitation is trivial. No evidence of pulmonic stenosis. Aorta: The aortic root is normal in size and structure. Venous: The inferior vena cava is normal in size with greater than 50% respiratory variability, suggesting right atrial pressure of 3 mmHg. IAS/Shunts: No atrial level shunt detected by color flow Doppler.  LEFT VENTRICLE PLAX 2D LVIDd:         3.60 cm     Diastology LVIDs:         2.50 cm     LV e' medial:    6.85 cm/s LV PW:  0.90 cm     LV E/e' medial:  9.0 LV IVS:        0.90 cm     LV e' lateral:   8.05 cm/s LVOT diam:     1.80 cm     LV E/e'  lateral: 7.6 LV SV:         53 LV SV Index:   26 LVOT Area:     2.54 cm  LV Volumes (MOD) LV vol d, MOD A2C: 60.8 ml LV vol d, MOD A4C: 66.1 ml LV vol s, MOD A2C: 20.7 ml LV vol s, MOD A4C: 29.3 ml LV SV MOD A2C:     40.1 ml LV SV MOD A4C:     66.1 ml LV SV MOD BP:      37.4 ml RIGHT VENTRICLE RV Basal diam:  3.60 cm RV Mid diam:    2.80 cm RV S prime:     11.70 cm/s TAPSE (M-mode): 1.8 cm LEFT ATRIUM           Index        RIGHT ATRIUM          Index LA diam:      2.20 cm 1.08 cm/m   RA Area:     9.17 cm LA Vol (A2C): 31.1 ml 15.22 ml/m  RA Volume:   17.60 ml 8.61 ml/m LA Vol (A4C): 28.0 ml 13.70 ml/m  AORTIC VALVE                    PULMONIC VALVE AV Area (Vmax):    2.21 cm     PV Vmax:          0.93 m/s AV Area (Vmean):   2.12 cm     PV Peak grad:     3.5 mmHg AV Area (VTI):     2.02 cm     PR End Diast Vel: 6.86 msec AV Vmax:           136.00 cm/s AV Vmean:          93.100 cm/s AV VTI:            0.261 m AV Peak Grad:      7.4 mmHg AV Mean Grad:      4.0 mmHg LVOT Vmax:         118.00 cm/s LVOT Vmean:        77.700 cm/s LVOT VTI:          0.207 m LVOT/AV VTI ratio: 0.79  AORTA Ao Root diam: 3.30 cm Ao Asc diam:  3.10 cm MITRAL VALVE               TRICUSPID VALVE MV Area (PHT): 3.77 cm    TR Peak grad:   23.0 mmHg MV Decel Time: 201 msec    TR Vmax:        240.00 cm/s MR Peak grad: 41.2 mmHg MR Vmax:      321.00 cm/s  SHUNTS MV E velocity: 61.40 cm/s  Systemic VTI:  0.21 m MV A velocity: 93.20 cm/s  Systemic Diam: 1.80 cm MV E/A ratio:  0.66 Kirk Ruths MD Electronically signed by Kirk Ruths MD Signature Date/Time: 07/23/2022/9:02:47 AM    Final    DG Chest Portable 1 View  Result Date: 07/22/2022 CLINICAL DATA:  Shortness of breath.  Positive COVID-19. EXAM: PORTABLE CHEST 1 VIEW COMPARISON:  Chest radiograph dated 04/24/2022. FINDINGS: Right mid to lower lung field opacity most consistent with  pneumonia. Clinical correlation and follow-up to resolution recommended. The left lung is clear. No  pleural effusion or pneumothorax. Cardiac Amplatzer occlusive device noted. No acute osseous pathology. IMPRESSION: Right mid to lower lung field pneumonia. Electronically Signed   By: Anner Crete M.D.   On: 07/22/2022 23:00      Subjective: Patient seen and examined at bedside today.  Hemodynamically stable for discharge.  Remains comfortable, denies any shortness of breath or cough.  Discharge Exam: Vitals:   07/29/22 0433 07/29/22 0948  BP: (!) 140/68 (!) 129/98  Pulse: 74 77  Resp: 16   Temp: (!) 97.5 F (36.4 C) (!) 97.5 F (36.4 C)  SpO2: 100% 100%   Vitals:   07/28/22 1943 07/28/22 2034 07/29/22 0433 07/29/22 0948  BP: (!) 145/89  (!) 140/68 (!) 129/98  Pulse: 93  74 77  Resp:   16   Temp: 98 F (36.7 C)  (!) 97.5 F (36.4 C) (!) 97.5 F (36.4 C)  TempSrc: Oral  Oral Oral  SpO2: 100% 99% 100% 100%  Weight:      Height:        General: Pt is alert, awake, not in acute distress Cardiovascular: RRR, S1/S2 +, no rubs, no gallops Respiratory: CTA bilaterally, no wheezing, no rhonchi Abdominal: Soft, NT, ND, bowel sounds + Extremities: no edema, no cyanosis    The results of significant diagnostics from this hospitalization (including imaging, microbiology, ancillary and laboratory) are listed below for reference.     Microbiology: Recent Results (from the past 240 hour(s))  Culture, blood (single)     Status: None   Collection Time: 07/22/22 11:40 PM   Specimen: BLOOD  Result Value Ref Range Status   Specimen Description   Final    BLOOD LEFT ANTECUBITAL Performed at [facility] [address], [place], [place] [address]    Special Requests   Final    BOTTLES DRAWN AEROBIC AND ANAEROBIC Blood Culture adequate volume Performed at [facility] [address] [place], [place] [address]    Culture   Final    NO GROWTH 5 DAYS Performed at [facility], [place] [address] [place], [place] [address]    Report  Status 07/28/2022 FINAL  Final  MRSA Next Gen by PCR, Nasal     Status: None   Collection Time: 07/23/22  2:42 AM   Specimen: Nasal Mucosa; Nasal Swab  Result Value Ref Range Status   MRSA by PCR Next Gen NOT DETECTED NOT DETECTED Final    Comment: (NOTE) The GeneXpert MRSA Assay (FDA approved for NASAL specimens only), is one component of a comprehensive MRSA colonization surveillance program. It is not intended to diagnose MRSA infection nor to guide or monitor treatment for MRSA infections. Test performance is not FDA approved in patients less than 40 years old. Performed at [facility], [place] [address] [place], [place] [address]      Labs: BNP (last 3 results) Recent Labs    07/22/22 2300  BNP [id number]   Basic Metabolic Panel: Recent Labs  Lab 07/24/22 0738 07/25/22 0315 07/26/22 0253 07/26/22 1206 07/27/22 0257 07/28/22 0249 07/29/22 0510  NA  --  133* 137  --  136 139 135  K  --  3.8 4.2  --  3.9 3.3* 3.8  CL  --  98 101  --  99 103 101  CO2  --  26 26  --  '25 27 24  '$ GLUCOSE  --  121* 240*  --  228* 154* 161*  BUN  --  13 21*  --  21* 23* 33*  CREATININE  --  1.14* 1.17*  --  0.98 0.93 0.89  CALCIUM  --  8.4* 9.1  --  9.4 9.0 8.7*  MG 1.2*  --   --  1.5* 2.0  --   --    Liver Function Tests: Recent Labs  Lab 07/24/22 0250 07/25/22 0315 07/26/22 0253 07/27/22 0257 07/28/22 0249  AST 34 34 36 71* 69*  ALT '23 23 25 '$ 45* 53*  ALKPHOS 94 109 145* 182* 173*  BILITOT 2.0* 2.0* 1.6* 1.4* 1.0  PROT 6.0* 6.1* 7.1 7.4 6.1*  ALBUMIN 2.3* 2.6* 2.7* 2.7* 2.2*   No results for input(s): "LIPASE", "AMYLASE" in the last 168 hours. No results for input(s): "AMMONIA" in the last 168 hours. CBC: Recent Labs  Lab 07/22/22 2300 07/23/22 0917 07/24/22 0250 07/27/22 0257 07/28/22 0249  WBC 11.0* 10.1 7.5 13.3* 11.6*  NEUTROABS 8.4* 7.2  --   --   --   HGB 11.6* 12.9 10.0* 11.0* 10.2*  HCT 35.0* 40.7 30.6* 32.9* 29.9*  MCV 107.7* 113.4* 110.1*  106.5* 104.5*  PLT 212 189 192 296 306   Cardiac Enzymes: No results for input(s): "CKTOTAL", "CKMB", "CKMBINDEX", "TROPONINI" in the last 168 hours. BNP: Invalid input(s): "POCBNP" CBG: Recent Labs  Lab 07/28/22 0753 07/28/22 1158 07/28/22 1711 07/28/22 2105 07/29/22 0717  GLUCAP 127* 87 131* 122* 136*   D-Dimer Recent Labs    07/27/22 0257  DDIMER 0.76*   Hgb A1c No results for input(s): "HGBA1C" in the last 72 hours. Lipid Profile No results for input(s): "CHOL", "HDL", "LDLCALC", "TRIG", "CHOLHDL", "LDLDIRECT" in the last 72 hours. Thyroid function studies No results for input(s): "TSH", "T4TOTAL", "T3FREE", "THYROIDAB" in the last 72 hours.  Invalid input(s): "FREET3" Anemia work up Recent Labs    07/26/22 1206  VITAMINB12 358  FOLATE 19.3   Urinalysis    Component Value Date/Time   COLORURINE YELLOW 09/04/2019 [place] 09/04/2019 0953   LABSPEC 1.041 (H) 09/04/2019 0953   PHURINE 5.0 09/04/2019 0953   GLUCOSEU NEGATIVE 09/04/2019 0953   HGBUR MODERATE (A) 09/04/2019 0953   BILIRUBINUR NEGATIVE 09/04/2019 0953   KETONESUR NEGATIVE 09/04/2019 0953   PROTEINUR NEGATIVE 09/04/2019 0953   UROBILINOGEN 1.0 02/22/2015 2105   NITRITE NEGATIVE 09/04/2019 0953   LEUKOCYTESUR SMALL (A) 09/04/2019 0953   Sepsis Labs Recent Labs  Lab 07/23/22 0917 07/24/22 0250 07/27/22 0257 07/28/22 0249  WBC 10.1 7.5 13.3* 11.6*   Microbiology Recent Results (from the past 240 hour(s))  Culture, blood (single)     Status: None   Collection Time: 07/22/22 11:40 PM   Specimen: BLOOD  Result Value Ref Range Status   Specimen Description   Final    BLOOD LEFT ANTECUBITAL Performed at [facility], [place] [address], [place], [place] [address]    Special Requests   Final    BOTTLES DRAWN AEROBIC AND ANAEROBIC Blood Culture adequate volume Performed at [facility] [address] [place], [place] [address]     Culture   Final    NO GROWTH 5 DAYS Performed at [facility], [place] [address] [name], [place] [address]    Report Status 07/28/2022 FINAL  Final  MRSA Next Gen by PCR, Nasal     Status: None   Collection Time: 07/23/22  2:42 AM   Specimen: Nasal Mucosa;  Nasal Swab  Result Value Ref Range Status   MRSA by PCR Next Gen NOT DETECTED NOT DETECTED Final    Comment: (NOTE) The GeneXpert MRSA Assay (FDA approved for NASAL specimens only), is one component of a comprehensive MRSA colonization surveillance program. It is not intended to diagnose MRSA infection nor to guide or monitor treatment for MRSA infections. Test performance is not FDA approved in patients less than 37 years old. Performed at [facility], [place] [address] [place], [place] [address]     Please note: You were cared for by a hospitalist during your hospital stay. Once you are discharged, your primary care physician will handle any further medical issues. Please note that NO REFILLS for any discharge medications will be authorized once you are discharged, as it is imperative that you return to your primary care physician (or establish a relationship with a primary care physician if you do not have one) for your post hospital discharge needs so that they can reassess your need for medications and monitor your lab values.    Time coordinating discharge: 40 minutes  SIGNED:   Shelly Coss, MD  [facility] 07/29/2022, 10:26 AM Pager [phone]  If 7PM-7AM, please contact night-coverage [email or web address] Password [email or web address]

## 2022-07-29 NOTE — Evaluation (Signed)
Physical Therapy Evaluation Patient Details Name: Brandy [name] MRN: [id number] DOB: [birth year] Today's Date: 07/29/2022  History of Present Illness  Patient is a 57 year old female with history of chronic respiratory failure 2 L of oxygen, metastatic breast cancer, GERD, anxiety, CKD stage IIIa, chronic hypertension. She has shortness of breath or diarrhea.  Chest x-ray showed pneumonia right medial and lower lobes. COVID test significantly positive  Clinical Impression  07/29/2022 Pt admitted with above diagnosis.  Pt currently with functional limitations due to the deficits listed below (see PT Problem List). Pt will benefit from skilled PT to increase their independence and safety with mobility to allow discharge to the venue listed below.    The patient is hopeful to Dc home today. Ambulated in room x 25' on RA , SPO2 1005. Patient reported feeling dizzy. BP  121/60.rec. HHPT     Recommendations for follow up therapy are one component of a multi-disciplinary discharge planning process, led by the attending physician.  Recommendations may be updated based on patient status, additional functional criteria and insurance authorization.  Follow Up Recommendations Home health PT      Assistance Recommended at Discharge PRN  Patient can return home with the following  Help with stairs or ramp for entrance;Assistance with cooking/housework    Equipment Recommendations None recommended by PT  Recommendations for Other Services       Functional Status Assessment Patient has had a recent decline in their functional status and demonstrates the ability to make significant improvements in function in a reasonable and predictable amount of time.     Precautions / Restrictions Precautions Precautions: Fall Precaution Comments: on O2      Mobility  Bed Mobility               General bed mobility comments: in recliner    Transfers Overall transfer level: Modified  independent Equipment used: Rolling walker (2 wheels), None                    Ambulation/Gait Ambulation/Gait assistance: Supervision Gait Distance (Feet): 25 Feet (x2) Assistive device: Rolling walker (2 wheels) Gait Pattern/deviations: Step-through pattern Gait velocity: decr     General Gait Details: reported dizziness, rested between walks  Stairs            Wheelchair Mobility    Modified Rankin (Stroke Patients Only)       Balance Overall balance assessment: Mild deficits observed, not formally tested                                           Pertinent Vitals/Pain Pain Assessment Pain Assessment: No/denies pain    Home Living Family/patient expects to be discharged to:: Private residence Living Arrangements: Spouse/significant other;Children Available Help at Discharge: Family;Available PRN/intermittently Type of Home: House Home Access: Stairs to enter Entrance Stairs-Rails: Right;Left Entrance Stairs-Number of Steps: 4   Home Layout: Two level;Able to live on main level with bedroom/bathroom Home Equipment: Rolling Walker (2 wheels);BSC/3in1;Cane - single point      Prior Function Prior Level of Function : Independent/Modified Independent             Mobility Comments: uses RW vs can PRN       Hand Dominance        Extremity/Trunk Assessment   Upper Extremity Assessment Upper Extremity Assessment: Overall WFL for tasks assessed  Lower Extremity Assessment Lower Extremity Assessment: Overall WFL for tasks assessed    Cervical / Trunk Assessment Cervical / Trunk Assessment: Normal  Communication   Communication: No difficulties  Cognition Arousal/Alertness: Awake/alert Behavior During Therapy: WFL for tasks assessed/performed Overall Cognitive Status: Within Functional Limits for tasks assessed                                          General Comments      Exercises      Assessment/Plan    PT Assessment Patient needs continued PT services  PT Problem List Decreased strength;Decreased mobility;Decreased activity tolerance       PT Treatment Interventions DME instruction;Therapeutic exercise;Gait training;Functional mobility training;Therapeutic activities    PT Goals (Current goals can be found in the Care Plan section)  Acute Rehab PT Goals Patient Stated Goal: go home PT Goal Formulation: With patient Time For Goal Achievement: 08/12/22 Potential to Achieve Goals: Good    Frequency Min 3X/week     Co-evaluation               AM-PAC PT "6 Clicks" Mobility  Outcome Measure Help needed turning from your back to your side while in a flat bed without using bedrails?: None Help needed moving from lying on your back to sitting on the side of a flat bed without using bedrails?: None Help needed moving to and from a bed to a chair (including a wheelchair)?: None Help needed standing up from a chair using your arms (e.g., wheelchair or bedside chair)?: None Help needed to walk in hospital room?: A Little Help needed climbing 3-5 steps with a railing? : A Little 6 Click Score: 22    End of Session Equipment Utilized During Treatment: Oxygen Activity Tolerance: Patient tolerated treatment well Patient left: in bed;with call bell/phone within reach Nurse Communication: Mobility status PT Visit Diagnosis: Unsteadiness on feet (R26.81)    Time: 0932-1000 PT Time Calculation (min) (ACUTE ONLY): 28 min   Charges:   PT Evaluation $PT Eval Low Complexity: 1 Low PT Treatments $Gait Training: 8-22 mins        .sigbn   Claretha Cooper 07/29/2022, 11:27 AM

## 2022-07-29 NOTE — TOC Transition Note (Signed)
Transition of Care [facility]) - CM/SW Discharge Note   Patient Details  Name: Brandy [name] MRN: [id number] Date of Birth: [birth year]  [facility] CM/SW Contact:  Ninfa Meeker, RN Phone Number: 07/29/2022, 12:54 PM   Clinical Narrative:    Case manager spoke with patient concerning therapy recommendation for Home Health PT.Discussed options for Home Health agencies, patient states she has been trying to contact [facility]. CM called and asked about services they provide, they are a non medical home care service. Case Manager explained this to patient and she agreed to have referral called to [facility], Adela Lank. Patient will have support of daughter at home. Home Health Agency added to AVS.    Final next level of care: [facility] Barriers to Discharge: No Barriers Identified   Patient Goals and CMS Choice   Choice offered to / list presented to : Patient  Discharge Placement                         Discharge Plan and Services Additional resources added to the After Visit Summary for   In-house Referral: NA Discharge Planning Services: CM Consult Post Acute Care Choice: Home Health          DME Arranged: N/A DME Agency: NA       HH Arranged: PT HH Agency: [place] Date [facility] Agency Contacted: 07/29/22 Time HH Agency Contacted: [phone] Representative spoke with at [place]: Adela Lank  Social Determinants of Health (SDOH) Interventions SDOH Screenings   Food Insecurity: Food Insecurity Present (07/27/2022)  Housing: Medium Risk (07/27/2022)  Transportation Needs: Unmet Transportation Needs (07/27/2022)  Utilities: At Risk (07/27/2022)  Depression (PHQ2-9): Low Risk  (06/15/2022)  Financial Resource Strain: High Risk (06/27/2022)  Tobacco Use: Medium Risk (07/23/2022)     Readmission Risk Interventions    04/26/2022   12:15 PM  Readmission Risk Prevention Plan   Transportation Screening Complete  PCP or Specialist Appt within 5-7 Days Complete  Home Care Screening Complete  Medication Review (RN CM) Complete

## 2022-07-29 NOTE — Plan of Care (Signed)
  Problem: Education: Goal: Knowledge of General Education information will improve Description: Including pain rating scale, medication(s)/side effects and non-pharmacologic comfort measures Outcome: Progressing   Problem: Health Behavior/Discharge Planning: Goal: Ability to manage health-related needs will improve Outcome: Progressing   Problem: Clinical Measurements: Goal: Ability to maintain clinical measurements within normal limits will improve Outcome: Progressing Goal: Will remain free from infection Outcome: Progressing Goal: Diagnostic test results will improve Outcome: Progressing Goal: Respiratory complications will improve Outcome: Progressing Goal: Cardiovascular complication will be avoided Outcome: Progressing   Problem: Activity: Goal: Risk for activity intolerance will decrease Outcome: Progressing   Problem: Nutrition: Goal: Adequate nutrition will be maintained Outcome: Progressing   Problem: Coping: Goal: Level of anxiety will decrease Outcome: Progressing   Problem: Elimination: Goal: Will not experience complications related to bowel motility Outcome: Progressing Goal: Will not experience complications related to urinary retention Outcome: Progressing   Problem: Pain Managment: Goal: General experience of comfort will improve Outcome: Progressing   Problem: Safety: Goal: Ability to remain free from injury will improve Outcome: Progressing   Problem: Skin Integrity: Goal: Risk for impaired skin integrity will decrease Outcome: Progressing   Problem: Education: Goal: Knowledge of risk factors and measures for prevention of condition will improve Outcome: Progressing   Problem: Coping: Goal: Psychosocial and spiritual needs will be supported Outcome: Progressing   Problem: Respiratory: Goal: Will maintain a patent airway Outcome: Progressing Goal: Complications related to the disease process, condition or treatment will be avoided or  minimized Outcome: Progressing   Problem: Education: Goal: Ability to describe self-care measures that may prevent or decrease complications (Diabetes Survival Skills Education) will improve Outcome: Progressing Goal: Individualized Educational Video(s) Outcome: Progressing   Problem: Coping: Goal: Ability to adjust to condition or change in health will improve Outcome: Progressing   Problem: Fluid Volume: Goal: Ability to maintain a balanced intake and output will improve Outcome: Progressing   Problem: Health Behavior/Discharge Planning: Goal: Ability to identify and utilize available resources and services will improve Outcome: Progressing Goal: Ability to manage health-related needs will improve Outcome: Progressing   Problem: Metabolic: Goal: Ability to maintain appropriate glucose levels will improve Outcome: Progressing   Problem: Nutritional: Goal: Maintenance of adequate nutrition will improve Outcome: Progressing Goal: Progress toward achieving an optimal weight will improve Outcome: Progressing   Problem: Skin Integrity: Goal: Risk for impaired skin integrity will decrease Outcome: Progressing   Problem: Tissue Perfusion: Goal: Adequacy of tissue perfusion will improve Outcome: Progressing   

## 2022-08-02 ENCOUNTER — Encounter (HOSPITAL_COMMUNITY): Payer: Medicare Other

## 2022-08-02 ENCOUNTER — Telehealth (HOSPITAL_COMMUNITY): Payer: Self-pay

## 2022-08-02 DIAGNOSIS — R0600 Dyspnea, unspecified: Secondary | ICD-10-CM | POA: Diagnosis not present

## 2022-08-02 NOTE — Telephone Encounter (Signed)
Called Brandy [name] to check on her. Left a message to call back when she is available along with call back number.

## 2022-08-04 ENCOUNTER — Encounter (HOSPITAL_COMMUNITY): Payer: Medicare Other

## 2022-08-04 DIAGNOSIS — N1832 Chronic kidney disease, stage 3b: Secondary | ICD-10-CM | POA: Diagnosis not present

## 2022-08-04 DIAGNOSIS — U071 COVID-19: Secondary | ICD-10-CM | POA: Diagnosis not present

## 2022-08-04 DIAGNOSIS — A419 Sepsis, unspecified organism: Secondary | ICD-10-CM | POA: Diagnosis not present

## 2022-08-04 DIAGNOSIS — J9611 Chronic respiratory failure with hypoxia: Secondary | ICD-10-CM | POA: Diagnosis not present

## 2022-08-04 DIAGNOSIS — I129 Hypertensive chronic kidney disease with stage 1 through stage 4 chronic kidney disease, or unspecified chronic kidney disease: Secondary | ICD-10-CM | POA: Diagnosis not present

## 2022-08-04 NOTE — Progress Notes (Signed)
Attempted to call for VV on 5/4 noa  answer, left message to call us before her appt.date

## 2022-08-05 ENCOUNTER — Telehealth: Payer: Self-pay

## 2022-08-05 NOTE — Telephone Encounter (Signed)
Richardson Landry calling from [facility] states that he needs verbal order for physical therapy once a week for 4 weeks. Verbal order given. Message routed to PCP [place], Nelda Bucks, NP as Juluis Rainier.

## 2022-08-08 ENCOUNTER — Encounter: Payer: Medicare Other | Admitting: Orthopedic Surgery

## 2022-08-08 ENCOUNTER — Encounter: Payer: Self-pay | Admitting: Family

## 2022-08-08 ENCOUNTER — Ambulatory Visit
Payer: Medicare Other | Attending: Student in an Organized Health Care Education/Training Program | Admitting: Student in an Organized Health Care Education/Training Program

## 2022-08-08 ENCOUNTER — Ambulatory Visit (INDEPENDENT_AMBULATORY_CARE_PROVIDER_SITE_OTHER): Payer: Medicare Other | Admitting: Family

## 2022-08-08 VITALS — BP 138/78 | HR 115 | Temp 96.3°F | Resp 16 | Ht 68.0 in | Wt 193.6 lb

## 2022-08-08 DIAGNOSIS — K219 Gastro-esophageal reflux disease without esophagitis: Secondary | ICD-10-CM | POA: Diagnosis not present

## 2022-08-08 DIAGNOSIS — J9611 Chronic respiratory failure with hypoxia: Secondary | ICD-10-CM | POA: Diagnosis not present

## 2022-08-08 DIAGNOSIS — M47816 Spondylosis without myelopathy or radiculopathy, lumbar region: Secondary | ICD-10-CM

## 2022-08-08 DIAGNOSIS — I10 Essential (primary) hypertension: Secondary | ICD-10-CM | POA: Diagnosis not present

## 2022-08-08 DIAGNOSIS — F411 Generalized anxiety disorder: Secondary | ICD-10-CM | POA: Diagnosis not present

## 2022-08-08 NOTE — Progress Notes (Signed)
Provider: Marlowe Sax FNP-C  Takayla Baillie, Nelda Bucks, NP  Patient Care Team: Hadlee Burback, Nelda Bucks, NP as PCP - General (Family Medicine) Berniece Salines, DO as PCP - Cardiology (Cardiology) Everardo Beals, NP as Nurse Practitioner  Extended Emergency Contact Information Primary Emergency Contact: [place] Address: [address]          [place], [place] [address] Johnnette Litter of [place] Phone: [phone] Relation: Spouse Secondary Emergency Contact: [place] Address: [address]          [place], [place] [address] Johnnette Litter of [place] Phone: [phone] Relation: Daughter  Code Status: Full Code  Goals of care: Advanced Directive information    08/08/2022   12:38 PM  Advanced Directives  Does Patient Have a Medical Advance Directive? No  Would patient like information on creating a medical advance directive? No - Patient declined     Chief Complaint  Patient presents with   Transitions Of Care    [facility] 07/22/2022-07/29/2022    HPI:  Pt is a 57 y.o. female seen today for an acute visit for transition of care post hospitalization from 07/22/2022 -07/29/2022 after she presented to the emergency room with complaints of productive cough, weakness and dizziness.  Prior to admission she was diagnosed with COVID 19. Her Temp was 101.5 She was noted to have shortness of breath and also diarrhea.  Checks x-ray done right medial and lower lobes infiltrates.  COVID-19 test was positive.  She was treated with Brovana,Yupelri,chest Physiotherapy and Hypertonic nebulization for bacterial pneumonia.Dexamethasone was tapered.Had hyperglycemia thought from steroids.she was maintained on her home oxygen 2 L Has had PT evaluation will be coming.   Past Medical History:  Diagnosis Date   Acute pansinusitis 08/02/2017   Anxiety    Arthritis    ASD (atrial septal defect)    s/p closure with Amplatzer device 10/05/04 (Dr. Myriam Jacobson, [facility]) 10/05/04   Cancer [facility])     Cataract    Chronic pain    CKD (chronic kidney disease)    Dyspnea    Fatty liver 09/04/2019   GERD (gastroesophageal reflux disease)    Heart murmur    no longer heard   History of hiatal hernia    Hyperlipidemia    Legally blind in right eye, as defined in [place]    Lumbar herniated disc    Migraines    On home oxygen therapy 06/15/2022   Pt was given O2 on D/C from [facility] 04/2022   OSA on CPAP 06/15/2022   PONV (postoperative nausea and vomiting)    Sciatica    Past Surgical History:  Procedure Laterality Date   ABLATION     BREAST LUMPECTOMY WITH RADIOACTIVE SEED AND SENTINEL LYMPH NODE BIOPSY Right 11/23/2020   Procedure: RIGHT BREAST LUMPECTOMY WITH RADIOACTIVE SEED AND RIGHT AXILLARY SENTINEL LYMPH NODE BIOPSY;  Surgeon: Rolm Bookbinder, MD;  Location[facility];  Service: General;  Laterality: Right;   BREAST SURGERY Bilateral 2011   Breast Reduction Surgery   BUNIONECTOMY     CARDIAC CATHETERIZATION     10/05/04 [facility]): LM < 25%, otherwise normal coronaries. No pulmonary HTN, Mildly enlarged RV. Secundum ASD s/p closure.   CARDIAC SURGERY     CATARACT EXTRACTION     CLEFT PALATE REPAIR     s/p cleft lip and palate repair   EYE SURGERY Right 2019   right eye removed   LUMBAR LAMINECTOMY/DECOMPRESSION MICRODISCECTOMY Left 05/23/2016   Procedure: LEFT L4-L5 LATERAL RECESS DECOMPRESSION WITH CENTRAL AND RIGHT  DECOMPRESSION VIA LEFT SIDE;  Surgeon: [name], MD;  Location: [place];  Service: Orthopedics;  Laterality: Left;   LUMBAR LAMINECTOMY/DECOMPRESSION MICRODISCECTOMY Left 05/23/2016   Procedure: LUMBAR LAMINECTOMY/DECOMPRESSION MICRODISCECTOMY Lumbar five - Sacral One 1 LEVEL;  Surgeon: [name], MD;  Location: [place];  Service: Orthopedics;  Laterality: Left;   REDUCTION MAMMAPLASTY  2011   SHOULDER INJECTION Left 05/23/2016   Procedure: SHOULDER INJECTION;  Surgeon: [name], MD;  Location: [place];  Service: Orthopedics;  Laterality: Left;  band-aid per  pa-c   TRANSTHORACIC ECHOCARDIOGRAM     12/15/05 [facility]): Mild LVH, EF > XX123456, grade 1 diastolic dysfunction, Trivial MR/PR/TR.   TUBAL LIGATION      Allergies  Allergen Reactions   Zithromax [Azithromycin] Shortness Of Breath, Itching and Other (See Comments)    TOTAL BODY ITCHING [EVEN SOLES OF FEET] WHEEZING    Tramadol Itching and Other (See Comments)    Has taken recently without any side effects.   Psyllium Nausea And Vomiting and Other (See Comments)    Metamucil. Sneezing      Outpatient Encounter Medications as of 08/08/2022  Medication Sig   abemaciclib (VERZENIO) 100 MG tablet Take 1 tablet (100 mg total) by mouth 2 (two) times daily.   acetaminophen (TYLENOL) 500 MG tablet Take 500 mg by mouth daily as needed for moderate pain.   albuterol (PROVENTIL) (2.5 MG/3ML) 0.083% nebulizer solution Take 3 mLs (2.5 mg total) by nebulization every 6 (six) hours as needed for wheezing or shortness of breath.   albuterol (VENTOLIN HFA) 108 (90 Base) MCG/ACT inhaler Inhale 2 puffs into the lungs every 6 (six) hours as needed for wheezing or shortness of breath.   ALPRAZolam (XANAX) 1 MG tablet Take 1 tablet (1 mg total) by mouth at bedtime.   amitriptyline (ELAVIL) 75 MG tablet TAKE 1 TABLET BY MOUTH AT BEDTIME   BELBUCA 300 MCG FILM Place 300 mcg inside cheek every 12 (twelve) hours.   Carboxymethylcellul-Glycerin (LUBRICATING EYE DROPS OP) Place 1 drop into the right eye in the morning, at noon, and at bedtime.   cyanocobalamin (VITAMIN B12) 500 MCG tablet Take 1 tablet (500 mcg total) by mouth daily.   diphenoxylate-atropine (LOMOTIL) 2.5-0.025 MG tablet Take 1 tablet by mouth 3 (three) times daily as needed for diarrhea or loose stools.   ezetimibe (ZETIA) 10 MG tablet Take 1 tablet (10 mg total) by mouth daily.   fluticasone-salmeterol (ADVAIR) 250-50 MCG/ACT AEPB INHALE 1 INHALATION BY MOUTH IN  THE MORNING AND AT BEDTIME   loperamide (IMODIUM) 2 MG capsule Take 1-2 capsules (2-4 mg  total) by mouth 4 (four) times daily as needed for diarrhea or loose stools.   methocarbamol (ROBAXIN) 500 MG tablet Take 1 tablet (500 mg total) by mouth every 6 (six) hours as needed for muscle spasms.   midodrine (PROAMATINE) 10 MG tablet Take 10 mg by mouth 3 (three) times daily as needed.   Multiple Vitamin (MULTIVITAMIN WITH MINERALS) TABS tablet Take 1 tablet by mouth daily.   omeprazole (PRILOSEC OTC) 20 MG tablet Take 20 mg by mouth daily.   PRESCRIPTION MEDICATION CPAP- At bedtime   triamcinolone cream (KENALOG) 0.1 % Apply 1 Application topically 2 (two) times daily.   Vitamin D, Ergocalciferol, (DRISDOL) 1.25 MG (50000 UNIT) CAPS capsule Take 50,000 Units by mouth every 7 (seven) days. Monday   White Petrolatum-Mineral Oil (LUBRICANT EYE NIGHTTIME) OINT Place 1 drop into the left eye at bedtime as needed (dryness).  XIIDRA 5 % SOLN Place 1 drop into the left eye 2 (two) times daily.   [DISCONTINUED] Cholecalciferol (VITAMIN D3 PO) Take 1 tablet by mouth daily.   [DISCONTINUED] midodrine (PROAMATINE) 5 MG tablet Take 1 tablet (5 mg total) by mouth 3 (three) times daily with meals.   [DISCONTINUED] Vitamin D, Ergocalciferol, (DRISDOL) 1.25 MG (50000 UNIT) CAPS capsule TAKE 1 CAPSULE ONCE PER WEEK (Patient taking differently: Take 50,000 Units by mouth every Monday.)   No facility-administered encounter medications on file as of 08/08/2022.    Review of Systems  Constitutional:  Negative for appetite change, chills, fatigue, fever and unexpected weight change.  HENT:  Negative for congestion, dental problem, ear discharge, ear pain, facial swelling, hearing loss, nosebleeds, postnasal drip, rhinorrhea, sinus pressure, sinus pain, sneezing, sore throat, tinnitus and trouble swallowing.   Eyes:  Positive for visual disturbance. Negative for pain, discharge, redness and itching.  Respiratory:  Negative for cough, chest tightness, shortness of breath and wheezing.   Cardiovascular:   Negative for chest pain, palpitations and leg swelling.  Gastrointestinal:  Negative for abdominal distention, abdominal pain, blood in stool, constipation, diarrhea, nausea and vomiting.  Endocrine: Negative for cold intolerance, heat intolerance, polydipsia, polyphagia and polyuria.  Genitourinary:  Negative for difficulty urinating, dysuria, flank pain, frequency and urgency.  Musculoskeletal:  Positive for gait problem. Negative for arthralgias, back pain, joint swelling, myalgias, neck pain and neck stiffness.  Skin:  Negative for color change, pallor, rash and wound.  Neurological:  Negative for dizziness, syncope, speech difficulty, weakness, light-headedness, numbness and headaches.  Hematological:  Does not bruise/bleed easily.  Psychiatric/Behavioral:  Negative for agitation, behavioral problems, confusion, hallucinations, self-injury, sleep disturbance and suicidal ideas. The patient is not nervous/anxious.     Immunization History  Administered Date(s) Administered   Influenza Inj Mdck Quad Pf 04/25/2019   Influenza,inj,Quad PF,6+ Mos 02/24/2020, 04/15/2022   Influenza,inj,quad, With Preservative 04/25/2019   Influenza-Unspecified 05/26/2021   Moderna Sars-Covid-2 Vaccination 06/29/2019, 08/03/2019, 06/24/2020   PFIZER(Purple Top)SARS-COV-2 Vaccination 05/26/2021   Pneumococcal Conjugate-13 05/29/2019   Pneumococcal Polysaccharide-23 07/29/2019   Zoster Recombinat (Shingrix) 02/17/2020, 04/20/2020   Pertinent  Health Maintenance Due  Topic Date Due   PAP SMEAR-Modifier  Never done   MAMMOGRAM  01/05/2024   COLONOSCOPY (Pts 45-24yr Insurance coverage will need to be confirmed)  08/16/2027   INFLUENZA VACCINE  Completed      06/30/2022   12:11 PM 07/07/2022   10:19 AM 07/12/2022   12:08 PM 07/14/2022   11:52 AM 08/08/2022   12:38 PM  Fall Risk  Falls in the past year? '1 1 1 1 '$ 0  Was there an injury with Fall? 0 0 0 0 0  Fall Risk Category Calculator '1 1 1 1 '$ 0  Patient at  Risk for Falls Due to History of fall(s);Impaired vision History of fall(s);Impaired vision History of fall(s);Impaired vision History of fall(s);Impaired vision No Fall Risks  Fall risk Follow up Falls evaluation completed;Education provided;Falls prevention discussed Falls evaluation completed Falls evaluation completed Falls evaluation completed;Falls prevention discussed Falls evaluation completed   Functional Status Survey:    Vitals:   08/08/22 1234  BP: 138/78  Pulse: (!) 115  Resp: 16  Temp: (!) 96.3 F (35.7 C)  SpO2: 96%  Weight: 193 lb 9.6 oz (87.8 kg)  Height: '5\' 8"'$  (1.727 m)   Body mass index is 29.44 kg/m. Physical Exam Vitals reviewed.  Constitutional:      General: She is not in acute distress.  Appearance: Normal appearance. She is normal weight. She is not ill-appearing or diaphoretic.  HENT:     Head: Normocephalic.     Right Ear: Tympanic membrane, ear canal and external ear normal. There is no impacted cerumen.     Left Ear: Tympanic membrane, ear canal and external ear normal. There is no impacted cerumen.     Nose: Nose normal. No congestion or rhinorrhea.     Mouth/Throat:     Mouth: Mucous membranes are moist.     Pharynx: Oropharynx is clear. No oropharyngeal exudate or posterior oropharyngeal erythema.  Eyes:     General: No scleral icterus.       Left eye: No discharge.     Conjunctiva/sclera: Conjunctivae normal.     Comments: Right eye blind   Neck:     Vascular: No carotid bruit.  Cardiovascular:     Rate and Rhythm: Normal rate and regular rhythm.     Pulses: Normal pulses.     Heart sounds: Normal heart sounds. No murmur heard.    No friction rub. No gallop.  Pulmonary:     Effort: Pulmonary effort is normal. No respiratory distress.     Breath sounds: Normal breath sounds. No wheezing, rhonchi or rales.  Chest:     Chest wall: No tenderness.  Abdominal:     General: Bowel sounds are normal. There is no distension.     Palpations:  Abdomen is soft. There is no mass.     Tenderness: There is no abdominal tenderness. There is no right CVA tenderness, left CVA tenderness, guarding or rebound.  Musculoskeletal:        General: No swelling or tenderness. Normal range of motion.     Cervical back: Normal range of motion. No rigidity or tenderness.     Right lower leg: No edema.     Left lower leg: No edema.  Lymphadenopathy:     Cervical: No cervical adenopathy.  Skin:    General: Skin is warm and dry.     Coloration: Skin is not pale.     Findings: No bruising, erythema, lesion or rash.  Neurological:     Mental Status: She is alert and oriented to person, place, and time.     Cranial Nerves: No cranial nerve deficit.     Sensory: No sensory deficit.     Motor: No weakness.     Coordination: Coordination normal.     Gait: Gait abnormal.  Psychiatric:        Mood and Affect: Mood normal.        Speech: Speech normal.        Behavior: Behavior normal.        Thought Content: Thought content normal.        Judgment: Judgment normal.     Labs reviewed: Recent Labs    04/24/22 1545 04/25/22 0844 04/26/22 0518 05/06/22 0958 07/24/22 0738 07/25/22 0315 07/26/22 1206 07/27/22 0257 07/28/22 0249 07/29/22 0510  NA 140 143 140   < >  --    < >  --  136 139 135  K 2.0* 3.1* 3.1*   < >  --    < >  --  3.9 3.3* 3.8  CL 91* 97* 97*   < >  --    < >  --  99 103 101  CO2 37* 37* 35*   < >  --    < >  --  '25 27 24  '$ GLUCOSE 118*  100* 104*   < >  --    < >  --  228* 154* 161*  BUN '10 8 8   '$ < >  --    < >  --  21* 23* 33*  CREATININE 1.54* 1.27* 1.12*   < >  --    < >  --  0.98 0.93 0.89  CALCIUM 8.0* 7.7* 7.9*   < >  --    < >  --  9.4 9.0 8.7*  MG 1.3* 1.5* 1.8   < > 1.2*  --  1.5* 2.0  --   --   PHOS 1.9* 1.7* 2.6  --   --   --   --   --   --   --    < > = values in this interval not displayed.   Recent Labs    07/26/22 0253 07/27/22 0257 07/28/22 0249  AST 36 71* 69*  ALT 25 45* 53*  ALKPHOS 145* 182*  173*  BILITOT 1.6* 1.4* 1.0  PROT 7.1 7.4 6.1*  ALBUMIN 2.7* 2.7* 2.2*   Recent Labs    06/24/22 1346 07/22/22 2300 07/23/22 0917 07/24/22 0250 07/27/22 0257 07/28/22 0249  WBC 6.1 11.0* 10.1 7.5 13.3* 11.6*  NEUTROABS 2.4 8.4* 7.2  --   --   --   HGB 12.0 11.6* 12.9 10.0* 11.0* 10.2*  HCT 35.4* 35.0* 40.7 30.6* 32.9* 29.9*  MCV 107.3* 107.7* 113.4* 110.1* 106.5* 104.5*  PLT 227 212 189 192 296 306   Lab Results  Component Value Date   TSH 0.428 07/23/2022   Lab Results  Component Value Date   HGBA1C 5.3 07/26/2022   Lab Results  Component Value Date   CHOL 219 (H) 06/07/2022   HDL 76 06/07/2022   LDLCALC 112 (H) 06/07/2022   TRIG 194 (H) 06/07/2022   CHOLHDL 2.9 06/07/2022    Significant Diagnostic Results in last 30 days:  CT CHEST WO CONTRAST  Result Date: 07/26/2022 CLINICAL DATA:  Chest pain.  Abnormal x-ray. EXAM: CT CHEST WITHOUT CONTRAST TECHNIQUE: Multidetector CT imaging of the chest was performed following the standard protocol without IV contrast. RADIATION DOSE REDUCTION: This exam was performed according to the departmental dose-optimization program which includes automated exposure control, adjustment of the mA and/or kV according to patient size and/or use of iterative reconstruction technique. COMPARISON:  Chest x-ray 07/22/2022 and prior CT scan 07/12/2021. FINDINGS: Cardiovascular: The heart is normal in size. No pericardial effusion. The aorta is normal in caliber. Stable atherosclerotic calcifications. Stable atrial occlusion device. Mediastinum/Nodes: No mediastinal or hilar mass or adenopathy. Small scattered lymph nodes are stable. The esophagus is grossly normal. Lungs/Pleura: Bilateral pulmonary infiltrates most notably in the right upper lobe. No pleural effusions. No pulmonary lesions. The central tracheobronchial tree is unremarkable. Upper Abdomen: No significant upper abdominal findings. Musculoskeletal: No significant bony findings. IMPRESSION:  1. Bilateral pulmonary infiltrates most notably in the right upper lobe. 2. No mediastinal or hilar mass or adenopathy. Electronically Signed   By: Marijo Sanes M.D.   On: 07/26/2022 17:38   ECHOCARDIOGRAM COMPLETE  Result Date: 07/23/2022    ECHOCARDIOGRAM REPORT   Patient Name:   TINLEE SESSUM Date of Exam: 07/23/2022 Medical Rec #:  [id number]                    Height:       68.0 in Accession #:    [id number]  Weight:       200.0 lb Date of Birth:  1966-06-05                    BSA:          2.044 m Patient Age:    17 years                     BP:           137/65 mmHg Patient Gender: F                            HR:           86 bpm. Exam Location:  Inpatient Procedure: 2D Echo and Intracardiac Opacification Agent Indications:    elevated troponin  History:        Patient has prior history of Echocardiogram examinations, most                 recent 06/03/2021. Arrythmias:Tachycardia; Risk                 Factors:Dyslipidemia.  Sonographer:    Harvie Junior Referring Phys: [place]  Sonographer Comments: Technically difficult study due to poor echo windows and patient is obese. Image acquisition challenging due to patient body habitus and Image acquisition challenging due to uncooperative patient. IMPRESSIONS  1. Left ventricular ejection fraction, by estimation, is 60 to 65%. The left ventricle has normal function. The left ventricle has no regional wall motion abnormalities. Left ventricular diastolic parameters are consistent with Grade I diastolic dysfunction (impaired relaxation).  2. Right ventricular systolic function is normal. The right ventricular size is normal. Tricuspid regurgitation signal is inadequate for assessing PA pressure.  3. The mitral valve is normal in structure. Trivial mitral valve regurgitation. No evidence of mitral stenosis.  4. The aortic valve is tricuspid. Aortic valve regurgitation is not visualized. Aortic valve sclerosis is  present, with no evidence of aortic valve stenosis.  5. The inferior vena cava is normal in size with greater than 50% respiratory variability, suggesting right atrial pressure of 3 mmHg. FINDINGS  Left Ventricle: Left ventricular ejection fraction, by estimation, is 60 to 65%. The left ventricle has normal function. The left ventricle has no regional wall motion abnormalities. Definity contrast agent was given IV to delineate the left ventricular  endocardial borders. The left ventricular internal cavity size was normal in size. There is no left ventricular hypertrophy. Left ventricular diastolic parameters are consistent with Grade I diastolic dysfunction (impaired relaxation). Right Ventricle: The right ventricular size is normal. Right ventricular systolic function is normal. Tricuspid regurgitation signal is inadequate for assessing PA pressure. The tricuspid regurgitant velocity is 2.40 m/s, and with an assumed right atrial  pressure of 3 mmHg, the estimated right ventricular systolic pressure is XX123456 mmHg. Left Atrium: Left atrial size was normal in size. Right Atrium: Right atrial size was normal in size. Pericardium: There is no evidence of pericardial effusion. Mitral Valve: The mitral valve is normal in structure. Trivial mitral valve regurgitation. No evidence of mitral valve stenosis. Tricuspid Valve: The tricuspid valve is normal in structure. Tricuspid valve regurgitation is trivial. No evidence of tricuspid stenosis. Aortic Valve: The aortic valve is tricuspid. Aortic valve regurgitation is not visualized. Aortic valve sclerosis is present, with no evidence of aortic valve stenosis. Aortic valve mean gradient measures 4.0 mmHg. Aortic valve peak gradient measures 7.4  mmHg.  Aortic valve area, by VTI measures 2.02 cm. Pulmonic Valve: The pulmonic valve was normal in structure. Pulmonic valve regurgitation is trivial. No evidence of pulmonic stenosis. Aorta: The aortic root is normal in size and  structure. Venous: The inferior vena cava is normal in size with greater than 50% respiratory variability, suggesting right atrial pressure of 3 mmHg. IAS/Shunts: No atrial level shunt detected by color flow Doppler.  LEFT VENTRICLE PLAX 2D LVIDd:         3.60 cm     Diastology LVIDs:         2.50 cm     LV e' medial:    6.85 cm/s LV PW:         0.90 cm     LV E/e' medial:  9.0 LV IVS:        0.90 cm     LV e' lateral:   8.05 cm/s LVOT diam:     1.80 cm     LV E/e' lateral: 7.6 LV SV:         53 LV SV Index:   26 LVOT Area:     2.54 cm  LV Volumes (MOD) LV vol d, MOD A2C: 60.8 ml LV vol d, MOD A4C: 66.1 ml LV vol s, MOD A2C: 20.7 ml LV vol s, MOD A4C: 29.3 ml LV SV MOD A2C:     40.1 ml LV SV MOD A4C:     66.1 ml LV SV MOD BP:      37.4 ml RIGHT VENTRICLE RV Basal diam:  3.60 cm RV Mid diam:    2.80 cm RV S prime:     11.70 cm/s TAPSE (M-mode): 1.8 cm LEFT ATRIUM           Index        RIGHT ATRIUM          Index LA diam:      2.20 cm 1.08 cm/m   RA Area:     9.17 cm LA Vol (A2C): 31.1 ml 15.22 ml/m  RA Volume:   17.60 ml 8.61 ml/m LA Vol (A4C): 28.0 ml 13.70 ml/m  AORTIC VALVE                    PULMONIC VALVE AV Area (Vmax):    2.21 cm     PV Vmax:          0.93 m/s AV Area (Vmean):   2.12 cm     PV Peak grad:     3.5 mmHg AV Area (VTI):     2.02 cm     PR End Diast Vel: 6.86 msec AV Vmax:           136.00 cm/s AV Vmean:          93.100 cm/s AV VTI:            0.261 m AV Peak Grad:      7.4 mmHg AV Mean Grad:      4.0 mmHg LVOT Vmax:         118.00 cm/s LVOT Vmean:        77.700 cm/s LVOT VTI:          0.207 m LVOT/AV VTI ratio: 0.79  AORTA Ao Root diam: 3.30 cm Ao Asc diam:  3.10 cm MITRAL VALVE               TRICUSPID VALVE MV Area (PHT): 3.77 cm    TR Peak grad:  23.0 mmHg MV Decel Time: 201 msec    TR Vmax:        240.00 cm/s MR Peak grad: 41.2 mmHg MR Vmax:      321.00 cm/s  SHUNTS MV E velocity: 61.40 cm/s  Systemic VTI:  0.21 m MV A velocity: 93.20 cm/s  Systemic Diam: 1.80 cm MV E/A ratio:  0.66  Kirk Ruths MD Electronically signed by Kirk Ruths MD Signature Date/Time: 07/23/2022/9:02:47 AM    Final    DG Chest Portable 1 View  Result Date: 07/22/2022 CLINICAL DATA:  Shortness of breath.  Positive COVID-19. EXAM: PORTABLE CHEST 1 VIEW COMPARISON:  Chest radiograph dated 04/24/2022. FINDINGS: Right mid to lower lung field opacity most consistent with pneumonia. Clinical correlation and follow-up to resolution recommended. The left lung is clear. No pleural effusion or pneumothorax. Cardiac Amplatzer occlusive device noted. No acute osseous pathology. IMPRESSION: Right mid to lower lung field pneumonia. Electronically Signed   By: Anner Crete M.D.   On: 07/22/2022 23:00    Assessment/Plan 1. Essential hypertension B/p stable  - not on any B/P medication  - COMPLETE METABOLIC PANEL WITH GFR - CBC with Differential/Platelet  2. Gastroesophageal reflux disease without esophagitis Symptoms stable -Continue on omeprazole  3. Generalized anxiety disorder Stable Continue on alprazolam and amitriptyline  4. Hypomagnesemia It was low during recent hospital admission.  Will recheck level - Magnesium  5. Chronic respiratory failure with hypoxia (HCC) Breathing stable -Continue with home O2 - COMPLETE METABOLIC PANEL WITH GFR  Family/ staff Communication: Reviewed plan of care with patient verbalized understanding  Labs/tests ordered:  - COMPLETE METABOLIC PANEL WITH GFR - CBC with Differential/Platelet  Next Appointment: Return if symptoms worsen or fail to improve.   Sandrea Hughs, NP

## 2022-08-09 ENCOUNTER — Encounter (HOSPITAL_COMMUNITY): Payer: Medicare Other

## 2022-08-09 ENCOUNTER — Telehealth (HOSPITAL_COMMUNITY): Payer: Self-pay

## 2022-08-09 LAB — COMPLETE METABOLIC PANEL WITH GFR
AG Ratio: 1.1 (calc) (ref 1.0–2.5)
ALT: 77 U/L — ABNORMAL HIGH (ref 6–29)
AST: 50 U/L — ABNORMAL HIGH (ref 10–35)
Albumin: 3.8 g/dL (ref 3.6–5.1)
Alkaline phosphatase (APISO): 286 U/L — ABNORMAL HIGH (ref 37–153)
BUN/Creatinine Ratio: 8 (calc) (ref 6–22)
BUN: 10 mg/dL (ref 7–25)
CO2: 30 mmol/L (ref 20–32)
Calcium: 9.5 mg/dL (ref 8.6–10.4)
Chloride: 99 mmol/L (ref 98–110)
Creat: 1.28 mg/dL — ABNORMAL HIGH (ref 0.50–1.03)
Globulin: 3.4 g/dL (calc) (ref 1.9–3.7)
Glucose, Bld: 82 mg/dL (ref 65–99)
Potassium: 4.2 mmol/L (ref 3.5–5.3)
Sodium: 141 mmol/L (ref 135–146)
Total Bilirubin: 1.3 mg/dL — ABNORMAL HIGH (ref 0.2–1.2)
Total Protein: 7.2 g/dL (ref 6.1–8.1)
eGFR: 49 mL/min/{1.73_m2} — ABNORMAL LOW (ref >=60)

## 2022-08-09 LAB — CBC WITH DIFFERENTIAL/PLATELET
Absolute Monocytes: 461 cells/uL (ref 200–950)
Basophils Absolute: 29 cells/uL (ref 0–200)
Basophils Relative: 0.6 %
Eosinophils Absolute: 38 cells/uL (ref 15–500)
Eosinophils Relative: 0.8 %
HCT: 32.7 % — ABNORMAL LOW (ref 35.0–45.0)
Hemoglobin: 11 g/dL — ABNORMAL LOW (ref 11.7–15.5)
Lymphs Abs: 2424 cells/uL (ref 850–3900)
MCH: 35.3 pg — ABNORMAL HIGH (ref 27.0–33.0)
MCHC: 33.6 g/dL (ref 32.0–36.0)
MCV: 104.8 fL — ABNORMAL HIGH (ref 80.0–100.0)
MPV: 9.6 fL (ref 7.5–12.5)
Monocytes Relative: 9.6 %
Neutro Abs: 1848 cells/uL (ref 1500–7800)
Neutrophils Relative %: 38.5 %
Platelets: 280 10*3/uL (ref 140–400)
RBC: 3.12 10*6/uL — ABNORMAL LOW (ref 3.80–5.10)
RDW: 12.4 % (ref 11.0–15.0)
Total Lymphocyte: 50.5 %
WBC: 4.8 10*3/uL (ref 3.8–10.8)

## 2022-08-09 LAB — MAGNESIUM: Magnesium: 1.2 mg/dL — ABNORMAL LOW (ref 1.5–2.5)

## 2022-08-09 NOTE — Progress Notes (Signed)
No answer

## 2022-08-09 NOTE — Telephone Encounter (Signed)
Received message on VM from [facility]. She is cleared to come back to [facility]. She was waiting for home health to see her as she wanted to do home health physical therapy for a month and then return. She has not heard from them so will return next Tuesday, 08/16/22.

## 2022-08-11 ENCOUNTER — Encounter (HOSPITAL_COMMUNITY): Payer: Medicare Other

## 2022-08-11 DIAGNOSIS — G4733 Obstructive sleep apnea (adult) (pediatric): Secondary | ICD-10-CM | POA: Diagnosis not present

## 2022-08-11 NOTE — Addendum Note (Signed)
Encounter addended by: [place], Audrinna Sherman G, RD on: 08/11/2022 11:57 AM  Actions taken: Flowsheet data copied forward, Flowsheet accepted

## 2022-08-11 NOTE — Addendum Note (Signed)
Encounter addended by: Vilinda Blanks, RRT on: 08/11/2022 12:28 PM  Actions taken: Vitals modified, Flowsheet data copied forward, Flowsheet accepted

## 2022-08-12 NOTE — Addendum Note (Signed)
Encounter addended by: Janine Ores, RN on: 08/12/2022 12:31 PM  Actions taken: Flowsheet data copied forward, Flowsheet accepted

## 2022-08-15 ENCOUNTER — Encounter (HOSPITAL_BASED_OUTPATIENT_CLINIC_OR_DEPARTMENT_OTHER): Payer: Self-pay | Admitting: Pulmonary Disease

## 2022-08-15 ENCOUNTER — Ambulatory Visit (HOSPITAL_BASED_OUTPATIENT_CLINIC_OR_DEPARTMENT_OTHER): Payer: Medicare Other | Admitting: Pulmonary Disease

## 2022-08-15 ENCOUNTER — Telehealth: Payer: Self-pay | Admitting: Pulmonary Disease

## 2022-08-15 VITALS — BP 134/78 | HR 107 | Ht 68.0 in | Wt 200.8 lb

## 2022-08-15 DIAGNOSIS — J9611 Chronic respiratory failure with hypoxia: Secondary | ICD-10-CM

## 2022-08-15 DIAGNOSIS — I129 Hypertensive chronic kidney disease with stage 1 through stage 4 chronic kidney disease, or unspecified chronic kidney disease: Secondary | ICD-10-CM | POA: Diagnosis not present

## 2022-08-15 DIAGNOSIS — N1832 Chronic kidney disease, stage 3b: Secondary | ICD-10-CM | POA: Diagnosis not present

## 2022-08-15 DIAGNOSIS — A419 Sepsis, unspecified organism: Secondary | ICD-10-CM | POA: Diagnosis not present

## 2022-08-15 DIAGNOSIS — R0602 Shortness of breath: Secondary | ICD-10-CM

## 2022-08-15 DIAGNOSIS — U071 COVID-19: Secondary | ICD-10-CM

## 2022-08-15 MED ORDER — FLUTICASONE-SALMETEROL 250-50 MCG/ACT IN AEPB: 1.0000 | INHALATION_SPRAY | Freq: Two times a day (BID) | RESPIRATORY_TRACT | 3 refills | Status: DC

## 2022-08-15 NOTE — Telephone Encounter (Signed)
Pls call [employer]. Script quantity in question. [phone]

## 2022-08-15 NOTE — Progress Notes (Signed)
Subjective:   PATIENT ID: Brandy [name] GENDER: female DOB: June 29, 1965, MRN: [id number]   HPI  Chief Complaint  Patient presents with   Follow-up    Had covid and Pneumonia and was in the hospital in feb. States getting better every day   Reason for Visit: Follow-up  [name] is a 57 year old female former smoker with metastatic breast cancer, obesity, chronic diastolic heart failure, chronic pain, anxiety, ASD s/p post-repair in 2006 and insomnia who presents for follow-up  Synopsis: Initially referred for evaluation of shortness of breath. PFTs with mild restrictive defect and reduced DLCO. On verzenio and faslodex for metastatic breast cancer 2021 - Methacholine challenge negative. Prior pulmonary nodules decreased in size since 02/2019, likely representing mets post-treatment. CT with no ILD. 2022 - S/p right lumpectomy in June with no breast cancer progression. Worsening dyspnea with unclear cause.Some coughing, nonproductive. Tried Breo which was ineffective. 2023 - Dx with OSA and on CPAP. Hospitalized in Nov for electrolyte issues and discharged on home O2. Ambulatory O2 in Dec with no desaturations.  05/18/22 Since our last visit she was started on CPAP and followed by Cardiology. She reports she is compliant with therapy. Last month she was hospitalized for electrolyte issues and discharged on home O2. Her last potassium was normal which Oncology monitors monthly. Reports worsening fatigue. Has productive cough. She is compliant with Advair but does not feels this help. She reports that walking short distances will cause her be winded. No wheezing. Has increased her O2 but unknown what her saturations are.  08/15/22 Since our last visit she was hospitalized from 07/22/22-07/29/22 for covid 19 pneumonia and treated with co-comitant bacterial pneumonia. Discharged on 2L O2. Will wear at home when she feels short of breath. She did not need O2 prior to this  hospitalization and did not need any during pulmonary rehab. She is planning to return to pulmonary rehab. She is stressed and planning to leave her husband and moving out soon.  Social History: Quit smoking in 2020. 1/4 ppd x 25 years.  Stressed about her husband, poor housing situation  Past Medical History:  Diagnosis Date   Acute pansinusitis 08/02/2017   Anxiety    Arthritis    ASD (atrial septal defect)    s/p closure with Amplatzer device 10/05/04 (Dr. Myriam Jacobson, [facility]) 10/05/04   Cancer [facility])    Cataract    Chronic pain    CKD (chronic kidney disease)    Dyspnea    Fatty liver 09/04/2019   GERD (gastroesophageal reflux disease)    Heart murmur    no longer heard   History of hiatal hernia    Hyperlipidemia    Legally blind in right eye, as defined in [place]    Lumbar herniated disc    Migraines    On home oxygen therapy 06/15/2022   Pt was given O2 on D/C from [facility] 04/2022   OSA on CPAP 06/15/2022   PONV (postoperative nausea and vomiting)    Sciatica      Allergies  Allergen Reactions   Zithromax [Azithromycin] Shortness Of Breath, Itching and Other (See Comments)    TOTAL BODY ITCHING [EVEN SOLES OF FEET] WHEEZING    Tramadol Itching and Other (See Comments)    Has taken recently without any side effects.   Psyllium Nausea And Vomiting and Other (See Comments)    Metamucil. Sneezing       Outpatient Medications Prior to Visit  Medication Sig  Dispense Refill   abemaciclib (VERZENIO) 100 MG tablet Take 1 tablet (100 mg total) by mouth 2 (two) times daily. 56 tablet 3   acetaminophen (TYLENOL) 500 MG tablet Take 500 mg by mouth daily as needed for moderate pain.     albuterol (PROVENTIL) (2.5 MG/3ML) 0.083% nebulizer solution Take 3 mLs (2.5 mg total) by nebulization every 6 (six) hours as needed for wheezing or shortness of breath. 180 mL 1   albuterol (VENTOLIN HFA) 108 (90 Base) MCG/ACT inhaler Inhale 2 puffs into the lungs every 6 (six) hours as needed  for wheezing or shortness of breath. 8 g 6   ALPRAZolam (XANAX) 1 MG tablet Take 1 tablet (1 mg total) by mouth at bedtime. 30 tablet 0   amitriptyline (ELAVIL) 75 MG tablet TAKE 1 TABLET BY MOUTH AT BEDTIME 90 tablet 1   BELBUCA 300 MCG FILM Place 300 mcg inside cheek every 12 (twelve) hours.     Carboxymethylcellul-Glycerin (LUBRICATING EYE DROPS OP) Place 1 drop into the right eye in the morning, at noon, and at bedtime.     cyanocobalamin (VITAMIN B12) 500 MCG tablet Take 1 tablet (500 mcg total) by mouth daily. 30 tablet 0   diphenoxylate-atropine (LOMOTIL) 2.5-0.025 MG tablet Take 1 tablet by mouth 3 (three) times daily as needed for diarrhea or loose stools. 30 tablet 0   ezetimibe (ZETIA) 10 MG tablet Take 1 tablet (10 mg total) by mouth daily. 90 tablet 1   loperamide (IMODIUM) 2 MG capsule Take 1-2 capsules (2-4 mg total) by mouth 4 (four) times daily as needed for diarrhea or loose stools. 30 capsule 1   methocarbamol (ROBAXIN) 500 MG tablet Take 1 tablet (500 mg total) by mouth every 6 (six) hours as needed for muscle spasms. 40 tablet 2   midodrine (PROAMATINE) 10 MG tablet Take 10 mg by mouth 3 (three) times daily as needed.     Multiple Vitamin (MULTIVITAMIN WITH MINERALS) TABS tablet Take 1 tablet by mouth daily.     omeprazole (PRILOSEC OTC) 20 MG tablet Take 20 mg by mouth daily.     PRESCRIPTION MEDICATION CPAP- At bedtime     triamcinolone cream (KENALOG) 0.1 % Apply 1 Application topically 2 (two) times daily. 30 g 0   Vitamin D, Ergocalciferol, (DRISDOL) 1.25 MG (50000 UNIT) CAPS capsule Take 50,000 Units by mouth every 7 (seven) days. Monday     White Petrolatum-Mineral Oil (LUBRICANT EYE NIGHTTIME) OINT Place 1 drop into the left eye at bedtime as needed (dryness).     XIIDRA 5 % SOLN Place 1 drop into the left eye 2 (two) times daily.     fluticasone-salmeterol (ADVAIR) 250-50 MCG/ACT AEPB INHALE 1 INHALATION BY MOUTH IN  THE MORNING AND AT BEDTIME 120 each 5   No  facility-administered medications prior to visit.    Review of Systems  Constitutional:  Negative for chills, diaphoresis, fever, malaise/fatigue and weight loss.  HENT:  Negative for congestion.   Respiratory:  Positive for shortness of breath. Negative for cough, hemoptysis, sputum production and wheezing.   Cardiovascular:  Negative for chest pain, palpitations and leg swelling.    Objective:   Vitals:   08/15/22 0829  BP: 134/78  Pulse: (!) 107  SpO2: 97%  Weight: 200 lb 12.8 oz (91.1 kg)  Height: '5\' 8"'$  (1.727 m)  SpO2: 97 % O2 Device: None (Room air)  Physical Exam: General: Well-appearing, no acute distress HENT: [place], AT, s/p cleft palate repair Eyes: EOMI, no scleral  icterus Respiratory: Clear to auscultation bilaterally.  No crackles, wheezing or rales Cardiovascular: RRR, -M/R/G, no JVD Extremities:-Edema,-tenderness Neuro: AAO x4, CNII-XII grossly intact Psych: Normal mood, normal affect  Data Reviewed:  Imaging: CTA 10/30/19 - No pulmonary embolism. RUL 10x31m, 516m unchanged. RLL nodule 21m65mew CT Chest 05/21/20 - No evidence of ILD. 1.4 x 1.0 x 0.8 cm aggressive appearing nodule near the apex of the right upper lobe PET 08/07/20 - 8 mm irregular nodule in right apex improved compared to CT 2020 PET 05/13/22 - 05/13/22 - Stable 8 mm nodule in the RUL, stable LUL 3mm70mT Chest 07/26/22 - Bilateral pulmonary infiltates R>L  PFT: 12/18/19 FVC 3.13 (83%) FEV1 2.49 (92%) Ratio 88  TLC 78% DLCO 58% Interpretation: Mild restrictive defect with moderate reduction in gas exchange  02/17/20 Negative methacholine challenge  04/30/21 FVC 2.64 (85%) FEV1 2.32 (94%) Ratio 89  TLC 80% DLCO 59% Interpretation: Isolated moderate reduction in gas exchange  Sleep: Sleep 07/14/21 - Mild OSA AHI 6.2. Nadir SpO2 81%  Echocardiogram: 09/2019 Normal EF, grade I DD, mild MR.  06/03/21 EF 60-65%. Normal diastolics. No valvular or WMA  Coronary CT 06/17/21 - Calcium score of  0  CBC    Component Value Date/Time   WBC 4.8 08/08/2022 1421   RBC 3.12 (L) 08/08/2022 1421   HGB 11.0 (L) 08/08/2022 1421   HGB 12.0 06/24/2022 1346   HGB 12.5 05/06/2022 0958   HCT 32.7 (L) 08/08/2022 1421   HCT 37.2 05/06/2022 0958   PLT 280 08/08/2022 1421   PLT 227 06/24/2022 1346   PLT 288 05/06/2022 0958   MCV 104.8 (H) 08/08/2022 1421   MCV 105 (H) 05/06/2022 0958   MCH 35.3 (H) 08/08/2022 1421   MCHC 33.6 08/08/2022 1421   RDW 12.4 08/08/2022 1421   RDW 13.8 05/06/2022 0958   LYMPHSABS 2,424 08/08/2022 1421   LYMPHSABS 3.2 (H) 07/28/2021 0916   MONOABS 0.5 07/23/2022 0917   EOSABS 38 08/08/2022 1421   EOSABS 0.1 07/28/2021 0916   BASOSABS 29 08/08/2022 1421   BASOSABS 0.0 07/28/2021 0916   Absolute eos  01/22/21 - 300 05/03/21 - 0 06/01/21 - 0 07/28/21 -100 04/22/22 -0  CPAP compliance per phone app 98% with 8 hours 47 min AHI 0.9     Assessment & Plan:   Discussion: 57 y20r old female former smoker (6 pack-years) with metastatic breast cancer s/p right lumpectomy with pulmonary mets who presents for follow-up. Hospital discharge summary reviewed. Recent hospitalization. Normal O2 sats in clinic. Clinically improved and ready to start pulm rehab. Discussed clinical course and management of COPD including bronchodilator regimen, preventive care including vaccinations and action plan for exacerbation.  Breo - d/c'd due to ineffectiveness  Shortness of breath - recent covid infection Deconditioning --Prior methacholine challenge which was negative for asthma --CONTINUE Advair 250-50 mcg ONE puff TWICE a day. REFILL --CONTINUE Albuterol AS NEEDED for shortness of breath --Use nebulizers AS NEEDED for exacerbations --RESTART Pulmonary Rehab  Nocturnal hypoxemia 2/2 OSA Isolated moderate DLCO --Echocardiogram neg --Low suspicion for PH. If above treatment neg, could consider RHC but the diagnosis is less likely in absence of hypoxemia --Wear oxygen for  goal SpO2 >88% --Plan for ambulatory O2 at next visit  OSA --Obtain CPAP compliance --Counseled on sleep hygiene --Counseled on weight loss/maintenance of healthy weight --Counseled NOT to drive if/when sleepy --Advised patient to wear CPAP for at least 4 hours each night for greater than 70% of the time to avoid  the machine being repossessed by insurance.   Health Maintenance Immunization History  Administered Date(s) Administered   Influenza Inj Mdck Quad Pf 04/25/2019   Influenza,inj,Quad PF,6+ Mos 02/24/2020, 04/15/2022   Influenza,inj,quad, With Preservative 04/25/2019   Influenza-Unspecified 05/26/2021   Moderna Sars-Covid-2 Vaccination 06/29/2019, 08/03/2019, 06/24/2020   PFIZER(Purple Top)SARS-COV-2 Vaccination 05/26/2021   Pneumococcal Conjugate-13 05/29/2019   Pneumococcal Polysaccharide-23 07/29/2019   Zoster Recombinat (Shingrix) 02/17/2020, 04/20/2020   CT Lung Screen - not qualified  No orders of the defined types were placed in this encounter.  Meds ordered this encounter  Medications   fluticasone-salmeterol (ADVAIR) 250-50 MCG/ACT AEPB    Sig: Inhale 1 puff into the lungs in the morning and at bedtime.    Dispense:  180 each    Refill:  3    Requesting 1 year supply   Return in about 3 months (around 11/15/2022).   I have spent a total time of 36-minutes on the day of the appointment including chart review, data review, collecting history, coordinating care and discussing medical diagnosis and plan with the patient/family. Past medical history, allergies, medications were reviewed. Pertinent imaging, labs and tests included in this note have been reviewed and interpreted independently by me.  Agenda, MD [facility] 08/15/2022 10:16 AM  Office Number [phone]

## 2022-08-15 NOTE — Patient Instructions (Signed)
  Shortness of breath - recent covid infection Deconditioning --Prior methacholine challenge which was negative for asthma --CONTINUE Advair 250-50 mcg ONE puff TWICE a day --CONTINUE Albuterol AS NEEDED for shortness of breath --Use nebulizers AS NEEDED for exacerbations --RESTART Pulmonary Rehab  Nocturnal hypoxemia 2/2 OSA Isolated moderate DLCO --Echocardiogram neg --Low suspicion for PH. If above treatment neg, could consider RHC but the diagnosis is less likely in absence of hypoxemia --Wear oxygen for goal SpO2 >88% --Plan for ambulatory O2 at next visit. May be able to discontinue next time  OSA --Obtain CPAP compliance --Counseled on sleep hygiene --Counseled on weight loss/maintenance of healthy weight --Counseled NOT to drive if/when sleepy --Advised patient to wear CPAP for at least 4 hours each night for greater than 70% of the time to avoid the machine being repossessed by insurance.  Follow-up with me in 3 months

## 2022-08-16 ENCOUNTER — Encounter (HOSPITAL_COMMUNITY): Payer: Medicare Other

## 2022-08-16 ENCOUNTER — Telehealth (HOSPITAL_COMMUNITY): Payer: Self-pay

## 2022-08-16 NOTE — Telephone Encounter (Signed)
Called Brandy [name] to see if she was coming to Pulmonary Rehab. Left a message with call back number.

## 2022-08-16 NOTE — Telephone Encounter (Signed)
Called and spoke with pharmacist at [facility]. She reviewed the patient's chart and could not see any notes made from yesterday. They have been able to process the RX, thus no PA is needed.   Will close encounter since nothing further is needed.

## 2022-08-16 NOTE — Telephone Encounter (Signed)
Hanah returned my call. She stated she was unable to come to [place] due to her back pain but she expects to be here Thursday.

## 2022-08-17 ENCOUNTER — Other Ambulatory Visit: Payer: Self-pay | Admitting: Cardiology

## 2022-08-17 NOTE — Addendum Note (Signed)
Encounter addended by: Vilinda Blanks, RRT on: 08/17/2022 9:49 AM  Actions taken: Clinical Note Signed

## 2022-08-17 NOTE — Progress Notes (Signed)
Pulmonary Individual Treatment Plan  Patient Details  Name: Brandy [name] MRN: [id number] Date of Birth: 1965/08/19 Referring Provider:   April Manson Pulmonary Rehab Walk Test from 06/15/2022 in [facility] [place]  Referring Provider [name]       Initial Encounter Date:  Flowsheet Row Pulmonary Rehab Walk Test from 06/15/2022 in [facility], & [place]  Date 06/15/22       Visit Diagnosis: Shortness of breath  Patient's Home Medications on Admission:   Current Outpatient Medications:    abemaciclib (VERZENIO) 100 MG tablet, Take 1 tablet (100 mg total) by mouth 2 (two) times daily., Disp: 56 tablet, Rfl: 3   acetaminophen (TYLENOL) 500 MG tablet, Take 500 mg by mouth daily as needed for moderate pain., Disp: , Rfl:    albuterol (PROVENTIL) (2.5 MG/3ML) 0.083% nebulizer solution, Take 3 mLs (2.5 mg total) by nebulization every 6 (six) hours as needed for wheezing or shortness of breath., Disp: 180 mL, Rfl: 1   albuterol (VENTOLIN HFA) 108 (90 Base) MCG/ACT inhaler, Inhale 2 puffs into the lungs every 6 (six) hours as needed for wheezing or shortness of breath., Disp: 8 g, Rfl: 6   ALPRAZolam (XANAX) 1 MG tablet, Take 1 tablet (1 mg total) by mouth at bedtime., Disp: 30 tablet, Rfl: 0   amitriptyline (ELAVIL) 75 MG tablet, TAKE 1 TABLET BY MOUTH AT BEDTIME, Disp: 90 tablet, Rfl: 1   BELBUCA 300 MCG FILM, Place 300 mcg inside cheek every 12 (twelve) hours., Disp: , Rfl:    Carboxymethylcellul-Glycerin (LUBRICATING EYE DROPS OP), Place 1 drop into the right eye in the morning, at noon, and at bedtime., Disp: , Rfl:    cyanocobalamin (VITAMIN B12) 500 MCG tablet, Take 1 tablet (500 mcg total) by mouth daily., Disp: 30 tablet, Rfl: 0   diphenoxylate-atropine (LOMOTIL) 2.5-0.025 MG tablet, Take 1 tablet by mouth 3 (three) times daily as needed for diarrhea or loose stools.,  Disp: 30 tablet, Rfl: 0   ezetimibe (ZETIA) 10 MG tablet, Take 1 tablet (10 mg total) by mouth daily., Disp: 90 tablet, Rfl: 1   fluticasone-salmeterol (ADVAIR) 250-50 MCG/ACT AEPB, Inhale 1 puff into the lungs in the morning and at bedtime., Disp: 180 each, Rfl: 3   loperamide (IMODIUM) 2 MG capsule, Take 1-2 capsules (2-4 mg total) by mouth 4 (four) times daily as needed for diarrhea or loose stools., Disp: 30 capsule, Rfl: 1   methocarbamol (ROBAXIN) 500 MG tablet, Take 1 tablet (500 mg total) by mouth every 6 (six) hours as needed for muscle spasms., Disp: 40 tablet, Rfl: 2   midodrine (PROAMATINE) 10 MG tablet, Take 10 mg by mouth 3 (three) times daily as needed., Disp: , Rfl:    Multiple Vitamin (MULTIVITAMIN WITH MINERALS) TABS tablet, Take 1 tablet by mouth daily., Disp: , Rfl:    omeprazole (PRILOSEC OTC) 20 MG tablet, Take 20 mg by mouth daily., Disp: , Rfl:    PRESCRIPTION MEDICATION, CPAP- At bedtime, Disp: , Rfl:    triamcinolone cream (KENALOG) 0.1 %, Apply 1 Application topically 2 (two) times daily., Disp: 30 g, Rfl: 0   Vitamin D, Ergocalciferol, (DRISDOL) 1.25 MG (50000 UNIT) CAPS capsule, Take 50,000 Units by mouth every 7 (seven) days. Monday, Disp: , Rfl:    White Petrolatum-Mineral Oil (LUBRICANT EYE NIGHTTIME) OINT, Place 1 drop into the left eye at bedtime as needed (dryness)., Disp: , Rfl:  XIIDRA 5 % SOLN, Place 1 drop into the left eye 2 (two) times daily., Disp: , Rfl:   Past Medical History: Past Medical History:  Diagnosis Date   Acute pansinusitis 08/02/2017   Anxiety    Arthritis    ASD (atrial septal defect)    s/p closure with Amplatzer device 10/05/04 (Dr. Myriam Jacobson, [facility]) 10/05/04   Cancer [facility])    Cataract    Chronic pain    CKD (chronic kidney disease)    Dyspnea    Fatty liver 09/04/2019   GERD (gastroesophageal reflux disease)    Heart murmur    no longer heard   History of hiatal hernia    Hyperlipidemia    Legally blind in right eye,  as defined in [place]    Lumbar herniated disc    Migraines    On home oxygen therapy 06/15/2022   Pt was given O2 on D/C from [facility] 04/2022   OSA on CPAP 06/15/2022   PONV (postoperative nausea and vomiting)    Sciatica     Tobacco Use: Social History   Tobacco Use  Smoking Status Former   Packs/day: 0.25   Years: 25.00   Total pack years: 6.25   Types: Cigarettes   Quit date: 02/04/2018   Years since quitting: 4.5  Smokeless Tobacco Never    Labs: Review Flowsheet  More data may exist      Latest Ref Rng & Units 08/04/2019 05/28/2021 10/26/2021 06/07/2022 07/26/2022  Labs for ITP Cardiac and Pulmonary Rehab  Cholestrol <200 mg/dL - 291  166  219  -  LDL (calc) mg/dL (calc) - 172  67  112  -  HDL-C > OR = 50 mg/dL - 87  74  76  -  Trlycerides <150 mg/dL 301  167  172  194  -  Hemoglobin A1c 4.8 - 5.6 % - - - - 5.3     Capillary Blood Glucose: Lab Results  Component Value Date   GLUCAP 85 07/29/2022   GLUCAP 136 (H) 07/29/2022   GLUCAP 122 (H) 07/28/2022   GLUCAP 131 (H) 07/28/2022   GLUCAP 87 07/28/2022     Pulmonary Assessment Scores:  Pulmonary Assessment Scores     Row Name 06/15/22 0951         ADL UCSD   SOB Score total 59       CAT Score   CAT Score 10       mMRC Score   mMRC Score 4             UCSD: Self-administered rating of dyspnea associated with activities of daily living (ADLs) 6-point scale (0 = "not at all" to 5 = "maximal or unable to do because of breathlessness")  Scoring Scores range from 0 to 120.  Minimally important difference is 5 units  CAT: CAT can identify the health impairment of COPD patients and is better correlated with disease progression.  CAT has a scoring range of zero to 40. The CAT score is classified into four groups of low (less than 10), medium (10 - 20), high (21-30) and very high (31-40) based on the impact level of disease on health status. A CAT score over 10 suggests significant symptoms.  A worsening CAT  score could be explained by an exacerbation, poor medication adherence, poor inhaler technique, or progression of COPD or comorbid conditions.  CAT MCID is 2 points  mMRC: mMRC (Modified Medical Research Council) Dyspnea Scale is used to assess the  degree of baseline functional disability in patients of respiratory disease due to dyspnea. No minimal important difference is established. A decrease in score of 1 point or greater is considered a positive change.   Pulmonary Function Assessment:  Pulmonary Function Assessment - 06/15/22 1140       Breath   Bilateral Breath Sounds Clear    Shortness of Breath No             Exercise Target Goals: Exercise Program Goal: Individual exercise prescription set using results from initial 6 min walk test and THRR while considering  patient's activity barriers and safety.   Exercise Prescription Goal: Initial exercise prescription builds to 30-45 minutes a day of aerobic activity, 2-3 days per week.  Home exercise guidelines will be given to patient during program as part of exercise prescription that the participant will acknowledge.  Activity Barriers & Risk Stratification:  Activity Barriers & Cardiac Risk Stratification - 06/15/22 1016       Activity Barriers & Cardiac Risk Stratification   Activity Barriers Deconditioning;Shortness of Breath;Muscular Weakness;Back Problems;Other (comment);Assistive Device    Comments Bilateral hip pain, sciatica             6 Minute Walk:  6 Minute Walk     Row Name 06/15/22 1137         6 Minute Walk   Phase Initial     Distance 968 feet     Walk Time 6 minutes     # of Rest Breaks 0     MPH 1.83     METS 3.6     RPE 12     Perceived Dyspnea  3     VO2 Peak 12.61     Symptoms No     Resting HR 92 bpm     Resting BP 118/66     Resting Oxygen Saturation  100 %     Exercise Oxygen Saturation  during 6 min walk 98 %     Max Ex. HR 138 bpm     Max Ex. BP 170/78     2 Minute Post  BP 140/76  4 min: 136/70       Interval HR   1 Minute HR 113     2 Minute HR 122     3 Minute HR 130     4 Minute HR 131     5 Minute HR 137     6 Minute HR 138     2 Minute Post HR 114     Interval Heart Rate? Yes       Interval Oxygen   Interval Oxygen? Yes     Baseline Oxygen Saturation % 100 %     1 Minute Oxygen Saturation % 100 %     1 Minute Liters of Oxygen 0 L     2 Minute Oxygen Saturation % 99 %     2 Minute Liters of Oxygen 0 L     3 Minute Oxygen Saturation % 98 %     3 Minute Liters of Oxygen 0 L     4 Minute Oxygen Saturation % 100 %     4 Minute Liters of Oxygen 0 L     5 Minute Oxygen Saturation % 100 %     5 Minute Liters of Oxygen 0 L     6 Minute Oxygen Saturation % 98 %     6 Minute Liters of Oxygen 0 L     2  Minute Post Oxygen Saturation % 99 %     2 Minute Post Liters of Oxygen 0 L              Oxygen Initial Assessment:  Oxygen Initial Assessment - 06/15/22 1139       Home Oxygen   Home Oxygen Device Home Concentrator;E-Tanks    Sleep Oxygen Prescription CPAP    Home Exercise Oxygen Prescription Continuous    Liters per minute 2    Compliance with Home Oxygen Use Yes      Initial 6 min Walk   Oxygen Used None      Program Oxygen Prescription   Program Oxygen Prescription None      Intervention   Short Term Goals To learn and understand importance of maintaining oxygen saturations>88%;To learn and exhibit compliance with exercise, home and travel O2 prescription;To learn and demonstrate proper use of respiratory medications;To learn and understand importance of monitoring SPO2 with pulse oximeter and demonstrate accurate use of the pulse oximeter.;To learn and demonstrate proper pursed lip breathing techniques or other breathing techniques.     Long  Term Goals Exhibits compliance with exercise, home  and travel O2 prescription;Maintenance of O2 saturations>88%;Compliance with respiratory medication;Verbalizes importance of monitoring SPO2  with pulse oximeter and return demonstration;Exhibits proper breathing techniques, such as pursed lip breathing or other method taught during program session             Oxygen Re-Evaluation:  Oxygen Re-Evaluation     Row Name 06/21/22 1601 07/12/22 1618 08/17/22 0822         Program Oxygen Prescription   Program Oxygen Prescription None None None       Home Oxygen   Home Oxygen Device Home Concentrator;E-Tanks Home Concentrator;E-Tanks Home Concentrator;E-Tanks     Sleep Oxygen Prescription CPAP CPAP CPAP     Home Exercise Oxygen Prescription Continuous Continuous Continuous     Liters per minute '2 2 2     '$ Compliance with Home Oxygen Use Yes Yes Yes       Goals/Expected Outcomes   Short Term Goals To learn and understand importance of maintaining oxygen saturations>88%;To learn and exhibit compliance with exercise, home and travel O2 prescription;To learn and demonstrate proper use of respiratory medications;To learn and understand importance of monitoring SPO2 with pulse oximeter and demonstrate accurate use of the pulse oximeter.;To learn and demonstrate proper pursed lip breathing techniques or other breathing techniques.  To learn and understand importance of maintaining oxygen saturations>88%;To learn and exhibit compliance with exercise, home and travel O2 prescription;To learn and demonstrate proper use of respiratory medications;To learn and understand importance of monitoring SPO2 with pulse oximeter and demonstrate accurate use of the pulse oximeter.;To learn and demonstrate proper pursed lip breathing techniques or other breathing techniques.  To learn and understand importance of maintaining oxygen saturations>88%;To learn and exhibit compliance with exercise, home and travel O2 prescription;To learn and demonstrate proper use of respiratory medications;To learn and understand importance of monitoring SPO2 with pulse oximeter and demonstrate accurate use of the pulse  oximeter.;To learn and demonstrate proper pursed lip breathing techniques or other breathing techniques.      Long  Term Goals Exhibits compliance with exercise, home  and travel O2 prescription;Maintenance of O2 saturations>88%;Compliance with respiratory medication;Verbalizes importance of monitoring SPO2 with pulse oximeter and return demonstration;Exhibits proper breathing techniques, such as pursed lip breathing or other method taught during program session Exhibits compliance with exercise, home  and travel O2 prescription;Maintenance of O2 saturations>88%;Compliance with respiratory  medication;Verbalizes importance of monitoring SPO2 with pulse oximeter and return demonstration;Exhibits proper breathing techniques, such as pursed lip breathing or other method taught during program session Exhibits compliance with exercise, home  and travel O2 prescription;Maintenance of O2 saturations>88%;Compliance with respiratory medication;Verbalizes importance of monitoring SPO2 with pulse oximeter and return demonstration;Exhibits proper breathing techniques, such as pursed lip breathing or other method taught during program session     Goals/Expected Outcomes Compliance and understanding of oxygen saturation monitoring and breathing techniques to decrease shortness of breath. Compliance and understanding of oxygen saturation monitoring and breathing techniques to decrease shortness of breath. Compliance and understanding of oxygen saturation monitoring and breathing techniques to decrease shortness of breath.              Oxygen Discharge (Final Oxygen Re-Evaluation):  Oxygen Re-Evaluation - 08/17/22 0822       Program Oxygen Prescription   Program Oxygen Prescription None      Home Oxygen   Home Oxygen Device Home Concentrator;E-Tanks    Sleep Oxygen Prescription CPAP    Home Exercise Oxygen Prescription Continuous    Liters per minute 2    Compliance with Home Oxygen Use Yes       Goals/Expected Outcomes   Short Term Goals To learn and understand importance of maintaining oxygen saturations>88%;To learn and exhibit compliance with exercise, home and travel O2 prescription;To learn and demonstrate proper use of respiratory medications;To learn and understand importance of monitoring SPO2 with pulse oximeter and demonstrate accurate use of the pulse oximeter.;To learn and demonstrate proper pursed lip breathing techniques or other breathing techniques.     Long  Term Goals Exhibits compliance with exercise, home  and travel O2 prescription;Maintenance of O2 saturations>88%;Compliance with respiratory medication;Verbalizes importance of monitoring SPO2 with pulse oximeter and return demonstration;Exhibits proper breathing techniques, such as pursed lip breathing or other method taught during program session    Goals/Expected Outcomes Compliance and understanding of oxygen saturation monitoring and breathing techniques to decrease shortness of breath.             Initial Exercise Prescription:  Initial Exercise Prescription - 06/15/22 1100       Date of Initial Exercise RX and Referring Provider   Date 06/15/22    Referring Provider [name]    Expected Discharge Date 08/18/22      Recumbant Elliptical   Level 1    Minutes 15    METs 2      Track   Minutes 15    METs 3.6      Prescription Details   Frequency (times per week) 2    Duration Progress to 30 minutes of continuous aerobic without signs/symptoms of physical distress      Intensity   THRR 40-80% of Max Heartrate 65-131    Ratings of Perceived Exertion 11-13    Perceived Dyspnea 0-4      Progression   Progression Continue progressive overload as per policy without signs/symptoms or physical distress.      Resistance Training   Training Prescription Yes    Weight red bands    Reps 10-15             Perform Capillary Blood Glucose checks as needed.  Exercise Prescription Changes:    Exercise Prescription Changes     Row Name 06/21/22 1200 07/05/22 1200 07/14/22 1201 07/14/22 1224 07/15/22 0900     Response to Exercise   Blood Pressure (Admit) 132/80 98/52 138/80 138/60 --   Blood Pressure (Exercise) 132/68  112/60 -- -- --   Blood Pressure (Exit) 120/80 112/58 114/60 114/60 --   Heart Rate (Admit) 117 bpm 89 bpm 102 bpm 102 bpm --   Heart Rate (Exercise) 127 bpm 114 bpm 108 bpm 108 bpm --   Heart Rate (Exit) 112 bpm 116 bpm 107 bpm 107 bpm --   Oxygen Saturation (Admit) 98 % 100 % 98 % 98 % --   Oxygen Saturation (Exercise) 95 % 98 % 95 % 98 % --   Oxygen Saturation (Exit) 100 % 97 % 100 % 100 % --   Rating of Perceived Exertion (Exercise) '11 13 14 12 '$ --   Perceived Dyspnea (Exercise) 0 '1 3 2 '$ --   Duration Progress to 30 minutes of  aerobic without signs/symptoms of physical distress Progress to 30 minutes of  aerobic without signs/symptoms of physical distress Progress to 30 minutes of  aerobic without signs/symptoms of physical distress Progress to 30 minutes of  aerobic without signs/symptoms of physical distress --   Intensity THRR unchanged THRR unchanged THRR unchanged THRR unchanged --     Progression   Progression Continue to progress workloads to maintain intensity without signs/symptoms of physical distress. Continue to progress workloads to maintain intensity without signs/symptoms of physical distress. Continue to progress workloads to maintain intensity without signs/symptoms of physical distress. Continue to progress workloads to maintain intensity without signs/symptoms of physical distress. --     Horticulturist, commercial Prescription Yes Yes Yes Yes --   Weight red bands red bands red bands red bands --   Reps 10-15 10-15 10-15 10-15 --   Time 10 Minutes 10 Minutes 10 Minutes 10 Minutes --     NuStep   Level '1 3 2 3 '$ --   SPM 60 60 60 60 --   Minutes '15 15 15 15 '$ --   METs 1.8 1.'9 2 2 '$ --     Recumbant Elliptical   Level 2 -- -- -- --    Minutes 15 -- -- -- --   METs 3.6 -- -- -- --     Track   Laps -- -- 6 6 --   Minutes -- '15 15 15 '$ --   METs -- 2.54 1.92 1.92 --     Home Exercise Plan   Plans to continue exercise at -- -- -- -- Home (comment)   Frequency -- -- -- -- --  N/A   Initial Home Exercises Provided -- -- -- -- 07/15/22            Exercise Comments:   Exercise Comments     Row Name 06/21/22 1207 07/15/22 0928         Exercise Comments Pt completed first day of exercise. She exercised for 13 min on the octane and 15 min on the Nustep. Teah stated that the octane hurt her buttocks. She averaged 3.6 METs at level 2 on the octane and 1.8 METs at level 1. Karene performed the warmup and cooldown standing/ seated based on her pain and shortness of breath. Discussed METs and how to increase METs. Completed home exercise plan. Keyarra is currently exercising at home. She exercises to walking video 2-3 non-rehab days/wk for about 60 min/day with rest breaks. I told Addisson that I was satisfied with her current home exercise. Sabrinna mentioned that she wished to join a fitness center to do exercise classes. 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 I encouraged Sherrita to call the [employer] to  discuss different class  offerings. Talese agreed with my recommendations. I will follow up with her to see if she has called the [facility]. Tyechia is very motivated to exercise and improve her functional capacity.               Exercise Goals and Review:   Exercise Goals     Row Name 06/15/22 1017 06/21/22 1555 07/12/22 1613 08/17/22 0822       Exercise Goals   Increase Physical Activity Yes Yes Yes Yes    Intervention Provide advice, education, support and counseling about physical activity/exercise needs.;Develop an individualized exercise prescription for aerobic and resistive training based on initial evaluation findings, risk stratification, comorbidities and participant's personal goals. Provide advice,  education, support and counseling about physical activity/exercise needs.;Develop an individualized exercise prescription for aerobic and resistive training based on initial evaluation findings, risk stratification, comorbidities and participant's personal goals. Provide advice, education, support and counseling about physical activity/exercise needs.;Develop an individualized exercise prescription for aerobic and resistive training based on initial evaluation findings, risk stratification, comorbidities and participant's personal goals. Provide advice, education, support and counseling about physical activity/exercise needs.;Develop an individualized exercise prescription for aerobic and resistive training based on initial evaluation findings, risk stratification, comorbidities and participant's personal goals.    Expected Outcomes Short Term: Attend rehab on a regular basis to increase amount of physical activity.;Long Term: Exercising regularly at least 3-5 days a week.;Long Term: Add in home exercise to make exercise part of routine and to increase amount of physical activity. Short Term: Attend rehab on a regular basis to increase amount of physical activity.;Long Term: Exercising regularly at least 3-5 days a week.;Long Term: Add in home exercise to make exercise part of routine and to increase amount of physical activity. Short Term: Attend rehab on a regular basis to increase amount of physical activity.;Long Term: Exercising regularly at least 3-5 days a week.;Long Term: Add in home exercise to make exercise part of routine and to increase amount of physical activity. Short Term: Attend rehab on a regular basis to increase amount of physical activity.;Long Term: Exercising regularly at least 3-5 days a week.;Long Term: Add in home exercise to make exercise part of routine and to increase amount of physical activity.    Increase Strength and Stamina Yes Yes Yes Yes    Intervention Provide advice,  education, support and counseling about physical activity/exercise needs.;Develop an individualized exercise prescription for aerobic and resistive training based on initial evaluation findings, risk stratification, comorbidities and participant's personal goals. Provide advice, education, support and counseling about physical activity/exercise needs.;Develop an individualized exercise prescription for aerobic and resistive training based on initial evaluation findings, risk stratification, comorbidities and participant's personal goals. Provide advice, education, support and counseling about physical activity/exercise needs.;Develop an individualized exercise prescription for aerobic and resistive training based on initial evaluation findings, risk stratification, comorbidities and participant's personal goals. Provide advice, education, support and counseling about physical activity/exercise needs.;Develop an individualized exercise prescription for aerobic and resistive training based on initial evaluation findings, risk stratification, comorbidities and participant's personal goals.    Expected Outcomes Short Term: Increase workloads from initial exercise prescription for resistance, speed, and METs.;Short Term: Perform resistance training exercises routinely during rehab and add in resistance training at home;Long Term: Improve cardiorespiratory fitness, muscular endurance and strength as measured by increased METs and functional capacity (6MWT) Short Term: Increase workloads from initial exercise prescription for resistance, speed, and METs.;Short Term: Perform resistance training exercises routinely during rehab and add in  resistance training at home;Long Term: Improve cardiorespiratory fitness, muscular endurance and strength as measured by increased METs and functional capacity (6MWT) Short Term: Increase workloads from initial exercise prescription for resistance, speed, and METs.;Short Term: Perform  resistance training exercises routinely during rehab and add in resistance training at home;Long Term: Improve cardiorespiratory fitness, muscular endurance and strength as measured by increased METs and functional capacity (6MWT) Short Term: Increase workloads from initial exercise prescription for resistance, speed, and METs.;Short Term: Perform resistance training exercises routinely during rehab and add in resistance training at home;Long Term: Improve cardiorespiratory fitness, muscular endurance and strength as measured by increased METs and functional capacity (6MWT)    Able to understand and use rate of perceived exertion (RPE) scale Yes Yes Yes Yes    Intervention Provide education and explanation on how to use RPE scale Provide education and explanation on how to use RPE scale Provide education and explanation on how to use RPE scale Provide education and explanation on how to use RPE scale    Expected Outcomes Short Term: Able to use RPE daily in rehab to express subjective intensity level;Long Term:  Able to use RPE to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 subject matter. There is an overview of "What is Oxygen" and "Why do we need it".  Instructor also reviews how to create a safe environment for oxygen use, the importance of using oxygen as prescribed, and the risks of noncompliance. There is a brief discussion on traveling with oxygen and resources the patient may utilize. [place]  OTHER RESPIRATORY from 06/30/2022 in [facility]  Date 06/30/22  Educator EP  Instruction Review Code 1- Verbalizes Understanding       Oxygen Use Group instruction provided by PowerPoint, verbal discussion, and written material to discuss how supplemental oxygen is prescribed and different types of oxygen supply systems. Resources for more information are provided.  Flowsheet Row PULMONARY REHAB OTHER RESPIRATORY from 07/07/2022 in [facility]  Date 07/07/22  Educator EP  Instruction Review Code 1- Verbalizes Understanding       Breathing Techniques Group instruction that is supported by demonstration and informational handouts. Instructor discusses the benefits of pursed lip and diaphragmatic breathing and detailed demonstration on how to perform both.  Flowsheet Row PULMONARY REHAB OTHER RESPIRATORY from 07/14/2022 in [facility]  Date 07/14/22  Educator EP  Instruction Review Code 1- Verbalizes Understanding        Risk Factor Reduction Group instruction that is supported by a PowerPoint presentation. Instructor discusses the definition of a risk factor, different risk factors for pulmonary disease, and how the heart and lungs work together.   MD Day A group question and answer session with a medical doctor that allows participants to ask questions that relate to their pulmonary disease state.   Nutrition for the Pulmonary Patient Group  instruction provided by PowerPoint slides, verbal discussion, and written materials to support subject matter. The instructor gives an explanation and review of healthy diet recommendations, which includes a discussion on weight management, recommendations for fruit and vegetable consumption, as well as protein, fluid, caffeine, fiber, sodium, sugar, and alcohol. Tips for eating when patients are short of breath are discussed.    Other Education Group or individual verbal, written, or video instructions that support the educational goals of the pulmonary rehab program.    Knowledge Questionnaire Score:  Knowledge Questionnaire Score - 06/15/22 0950       Knowledge Questionnaire Score   Pre Score 16/18             Core Components/Risk Factors/Patient Goals at Admission:  Personal Goals and Risk Factors at Admission - 06/15/22 1014       Core Components/Risk Factors/Patient Goals on Admission    Weight Management Weight Loss;Yes    Intervention Weight Management: Provide education and appropriate resources to help participant work on and attain dietary goals.;Weight Management/Obesity: Establish reasonable short term and long term weight goals.;Obesity: Provide education and appropriate resources to help participant work on and attain dietary goals.    Admit Weight 197 lb 8 oz (89.6 kg)    Goal Weight: Short Term 187 lb (84.8 kg)    Goal Weight: Long Term 140 lb (63.5 kg)    Expected Outcomes Short Term: Continue to assess and modify interventions until short term weight is achieved;Long Term: Adherence to nutrition and physical activity/exercise program aimed toward attainment of established weight goal;Weight Loss: Understanding of general recommendations for a balanced deficit meal plan, which promotes 1-2 lb weight loss per week and includes a negative energy balance of [id number] kcal/d    Improve shortness of breath with ADL's Yes    Intervention Provide education, individualized  exercise plan and daily activity instruction to help decrease symptoms of SOB with activities of daily living.    Expected Outcomes Short Term: Improve cardiorespiratory fitness to achieve a reduction of symptoms when performing ADLs;Long Term:  Be able to perform more ADLs without symptoms or delay the onset of symptoms    Increase knowledge of respiratory medications and ability to use respiratory devices properly  Yes    Intervention Provide education and demonstration as needed of appropriate use of medications, inhalers, and oxygen therapy.    Expected Outcomes Short Term: Achieves understanding of medications use. Understands that oxygen is a medication prescribed by physician. Demonstrates appropriate use of inhaler and oxygen therapy.;Long Term: Maintain appropriate use of medications, inhalers, and oxygen therapy.    Stress Yes    Intervention Offer individual and/or small group education and counseling on adjustment to heart disease, stress management and health-related lifestyle change. Teach and support self-help strategies.;Refer participants experiencing significant psychosocial distress to appropriate mental health specialists for further evaluation and treatment. When possible, include family members and significant others in education/counseling sessions.    Expected Outcomes Short Term: Participant demonstrates changes in health-related behavior, relaxation and other stress management skills, ability to obtain effective social support, and compliance with psychotropic medications if prescribed.;Long Term: Emotional wellbeing is indicated by absence of clinically significant psychosocial distress or social isolation.             Core Components/Risk Factors/Patient Goals Review:   Goals and Risk Factor Review     Row Name 06/20/22 1057 07/13/22 1435 08/12/22 1226         Core Components/Risk Factors/Patient Goals Review   Personal Goals Review Weight Management/Obesity;Increase  knowledge of respiratory medications and ability to use respiratory devices properly.;Stress;Improve shortness of breath with ADL's;Develop more efficient breathing techniques such as purse lipped breathing and diaphragmatic breathing and practicing self-pacing with activity. Weight Management/Obesity;Increase knowledge of respiratory medications and ability to use respiratory devices properly.;Stress;Improve shortness of breath with ADL's;Develop more efficient breathing techniques such as purse lipped breathing and diaphragmatic breathing and practicing self-pacing with activity. Weight Management/Obesity;Increase knowledge of respiratory medications and ability to use respiratory devices properly.;Stress;Improve shortness of breath with ADL's;Develop more efficient breathing techniques such as purse lipped breathing and diaphragmatic breathing and practicing self-pacing with activity.     Review Umeko has not started PR class yet. She is scheduled to start on 1/16. We look forward to educating and teaching Danilynn about the above topics. Odie has completed 6 PR classes so far. She enjoys working out on [employer] and walking the track. She has been able to increase her workload and METs on the [employer] and increase her laps on the track. She knows how to report her Rate of Perceived Exertion and her Dyspnea scale to staff. Belicia has attended oxygen use and oxygen safety classes. Teaunna is also working with the nutrionist to achieve weight loss. Marit enjoys coming to PR and we will continue to work with Estill Bamberg to obtain her goals. Fani has completed 7 PR classes so far. She has been working out on [employer] and walking the track. She has been able to increase her workload and METs on the [employer] and increase her laps on the track. She knows how to report her Rate of Perceived Exertion and her Dyspnea scale to staff. Meridel has attended oxygen use and oxygen safety classes. Colette is also working with the  nutritionist to achieve weight loss though we have not monitored her for the last month while she was hospitalized. We expect Lazara to come back to class next week and will continue to work with Estill Bamberg to achieve her goals.     Expected Outcomes See Admission Goals See  Admission Goals See Admission Goals              Core Components/Risk Factors/Patient Goals at Discharge (Final Review):   Goals and Risk Factor Review - 08/12/22 1226       Core Components/Risk Factors/Patient Goals Review   Personal Goals Review Weight Management/Obesity;Increase knowledge of respiratory medications and ability to use respiratory devices properly.;Stress;Improve shortness of breath with ADL's;Develop more efficient breathing techniques such as purse lipped breathing and diaphragmatic breathing and practicing self-pacing with activity.    Review Philicia has completed 7 PR classes so far. She has been working out on [employer] and walking the track. She has been able to increase her workload and METs on the [employer] and increase her laps on the track. She knows how to report her Rate of Perceived Exertion and her Dyspnea scale to staff. Debra has attended oxygen use and oxygen safety classes. Kainaat is also working with the nutritionist to achieve weight loss though we have not monitored her for the last month while she was hospitalized. We expect Carmie to come back to class next week and will continue to work with Estill Bamberg to achieve her goals.    Expected Outcomes See Admission Goals             ITP Comments:   Comments: Dr. Rodman Pickle is Medical Director for Pulmonary Rehab at [facility].

## 2022-08-17 NOTE — Progress Notes (Deleted)
Pulmonary Individual Treatment Plan  Patient Details  Name: Brandy [name] MRN: [id number] Date of Birth: 1966/03/12 Referring Provider:   April Manson Pulmonary Rehab Walk Test from 06/15/2022 in [facility] [place]  Referring Provider Brandy [name]       Initial Encounter Date:  Flowsheet Row Pulmonary Rehab Walk Test from 06/15/2022 in [facility] [place]  Date 06/15/22       Visit Diagnosis: Shortness of breath  Patient's Home Medications on Admission:   Current Outpatient Medications:    abemaciclib (VERZENIO) 100 MG tablet, Take 1 tablet (100 mg total) by mouth 2 (two) times daily., Disp: 56 tablet, Rfl: 3   acetaminophen (TYLENOL) 500 MG tablet, Take 500 mg by mouth daily as needed for moderate pain., Disp: , Rfl:    albuterol (PROVENTIL) (2.5 MG/3ML) 0.083% nebulizer solution, Take 3 mLs (2.5 mg total) by nebulization every 6 (six) hours as needed for wheezing or shortness of breath., Disp: 180 mL, Rfl: 1   albuterol (VENTOLIN HFA) 108 (90 Base) MCG/ACT inhaler, Inhale 2 puffs into the lungs every 6 (six) hours as needed for wheezing or shortness of breath., Disp: 8 g, Rfl: 6   ALPRAZolam (XANAX) 1 MG tablet, Take 1 tablet (1 mg total) by mouth at bedtime., Disp: 30 tablet, Rfl: 0   amitriptyline (ELAVIL) 75 MG tablet, TAKE 1 TABLET BY MOUTH AT BEDTIME, Disp: 90 tablet, Rfl: 1   BELBUCA 300 MCG FILM, Place 300 mcg inside cheek every 12 (twelve) hours., Disp: , Rfl:    Carboxymethylcellul-Glycerin (LUBRICATING EYE DROPS OP), Place 1 drop into the right eye in the morning, at noon, and at bedtime., Disp: , Rfl:    cyanocobalamin (VITAMIN B12) 500 MCG tablet, Take 1 tablet (500 mcg total) by mouth daily., Disp: 30 tablet, Rfl: 0   diphenoxylate-atropine (LOMOTIL) 2.5-0.025 MG tablet, Take 1 tablet by mouth 3 (three) times daily as needed for diarrhea or loose stools.,  Disp: 30 tablet, Rfl: 0   ezetimibe (ZETIA) 10 MG tablet, Take 1 tablet (10 mg total) by mouth daily., Disp: 90 tablet, Rfl: 1   fluticasone-salmeterol (ADVAIR) 250-50 MCG/ACT AEPB, Inhale 1 puff into the lungs in the morning and at bedtime., Disp: 180 each, Rfl: 3   loperamide (IMODIUM) 2 MG capsule, Take 1-2 capsules (2-4 mg total) by mouth 4 (four) times daily as needed for diarrhea or loose stools., Disp: 30 capsule, Rfl: 1   methocarbamol (ROBAXIN) 500 MG tablet, Take 1 tablet (500 mg total) by mouth every 6 (six) hours as needed for muscle spasms., Disp: 40 tablet, Rfl: 2   midodrine (PROAMATINE) 10 MG tablet, Take 10 mg by mouth 3 (three) times daily as needed., Disp: , Rfl:    Multiple Vitamin (MULTIVITAMIN WITH MINERALS) TABS tablet, Take 1 tablet by mouth daily., Disp: , Rfl:    omeprazole (PRILOSEC OTC) 20 MG tablet, Take 20 mg by mouth daily., Disp: , Rfl:    PRESCRIPTION MEDICATION, CPAP- At bedtime, Disp: , Rfl:    triamcinolone cream (KENALOG) 0.1 %, Apply 1 Application topically 2 (two) times daily., Disp: 30 g, Rfl: 0   Vitamin D, Ergocalciferol, (DRISDOL) 1.25 MG (50000 UNIT) CAPS capsule, Take 50,000 Units by mouth every 7 (seven) days. Monday, Disp: , Rfl:    White Petrolatum-Mineral Oil (LUBRICANT EYE NIGHTTIME) OINT, Place 1 drop into the left eye at bedtime as needed (dryness)., Disp: , Rfl:  XIIDRA 5 % SOLN, Place 1 drop into the left eye 2 (two) times daily., Disp: , Rfl:   Past Medical History: Past Medical History:  Diagnosis Date   Acute pansinusitis 08/02/2017   Anxiety    Arthritis    ASD (atrial septal defect)    s/p closure with Amplatzer device 10/05/04 (Dr. Myriam Jacobson, [facility]) 10/05/04   Cancer [facility])    Cataract    Chronic pain    CKD (chronic kidney disease)    Dyspnea    Fatty liver 09/04/2019   GERD (gastroesophageal reflux disease)    Heart murmur    no longer heard   History of hiatal hernia    Hyperlipidemia    Legally blind in right eye,  as defined in [place]    Lumbar herniated disc    Migraines    On home oxygen therapy 06/15/2022   Pt was given O2 on D/C from [facility] 04/2022   OSA on CPAP 06/15/2022   PONV (postoperative nausea and vomiting)    Sciatica     Tobacco Use: Social History   Tobacco Use  Smoking Status Former   Packs/day: 0.25   Years: 25.00   Total pack years: 6.25   Types: Cigarettes   Quit date: 02/04/2018   Years since quitting: 4.5  Smokeless Tobacco Never    Labs: Review Flowsheet  More data Brandy [name] exist      Latest Ref Rng & Units 08/04/2019 05/28/2021 10/26/2021 06/07/2022 07/26/2022  Labs for ITP Cardiac and Pulmonary Rehab  Cholestrol <200 mg/dL - 291  166  219  -  LDL (calc) mg/dL (calc) - 172  67  112  -  HDL-C > OR = 50 mg/dL - 87  74  76  -  Trlycerides <150 mg/dL 301  167  172  194  -  Hemoglobin A1c 4.8 - 5.6 % - - - - 5.3     Capillary Blood Glucose: Lab Results  Component Value Date   GLUCAP 85 07/29/2022   GLUCAP 136 (H) 07/29/2022   GLUCAP 122 (H) 07/28/2022   GLUCAP 131 (H) 07/28/2022   GLUCAP 87 07/28/2022     Pulmonary Assessment Scores:  Pulmonary Assessment Scores     Row Name 06/15/22 0951         ADL UCSD   SOB Score total 59       CAT Score   CAT Score 10       mMRC Score   mMRC Score 4             UCSD: Self-administered rating of dyspnea associated with activities of daily living (ADLs) 6-point scale (0 = "not at all" to 5 = "maximal or unable to do because of breathlessness")  Scoring Scores range from 0 to 120.  Minimally important difference is 5 units  CAT: CAT can identify the health impairment of COPD patients and is better correlated with disease progression.  CAT has a scoring range of zero to 40. The CAT score is classified into four groups of low (less than 10), medium (10 - 20), high (21-30) and very high (31-40) based on the impact level of disease on health status. A CAT score over 10 suggests significant symptoms.  A worsening CAT  score could be explained by an exacerbation, poor medication adherence, poor inhaler technique, or progression of COPD or comorbid conditions.  CAT MCID is 2 points  mMRC: mMRC (Modified Medical Research Council) Dyspnea Scale is used to assess the  degree of baseline functional disability in patients of respiratory disease due to dyspnea. No minimal important difference is established. A decrease in score of 1 point or greater is considered a positive change.   Pulmonary Function Assessment:  Pulmonary Function Assessment - 06/15/22 1140       Breath   Bilateral Breath Sounds Clear    Shortness of Breath No             Exercise Target Goals: Exercise Program Goal: Individual exercise prescription set using results from initial 6 min walk test and THRR while considering  patient's activity barriers and safety.   Exercise Prescription Goal: Initial exercise prescription builds to 30-45 minutes a day of aerobic activity, 2-3 days per week.  Home exercise guidelines will be given to patient during program as part of exercise prescription that the participant will acknowledge.  Activity Barriers & Risk Stratification:  Activity Barriers & Cardiac Risk Stratification - 06/15/22 1016       Activity Barriers & Cardiac Risk Stratification   Activity Barriers Deconditioning;Shortness of Breath;Muscular Weakness;Back Problems;Other (comment);Assistive Device    Comments Bilateral hip pain, sciatica             6 Minute Walk:  6 Minute Walk     Row Name 06/15/22 1137         6 Minute Walk   Phase Initial     Distance 968 feet     Walk Time 6 minutes     # of Rest Breaks 0     MPH 1.83     METS 3.6     RPE 12     Perceived Dyspnea  3     VO2 Peak 12.61     Symptoms No     Resting HR 92 bpm     Resting BP 118/66     Resting Oxygen Saturation  100 %     Exercise Oxygen Saturation  during 6 min walk 98 %     Max Ex. HR 138 bpm     Max Ex. BP 170/78     2 Minute Post  BP 140/76  4 min: 136/70       Interval HR   1 Minute HR 113     2 Minute HR 122     3 Minute HR 130     4 Minute HR 131     5 Minute HR 137     6 Minute HR 138     2 Minute Post HR 114     Interval Heart Rate? Yes       Interval Oxygen   Interval Oxygen? Yes     Baseline Oxygen Saturation % 100 %     1 Minute Oxygen Saturation % 100 %     1 Minute Liters of Oxygen 0 L     2 Minute Oxygen Saturation % 99 %     2 Minute Liters of Oxygen 0 L     3 Minute Oxygen Saturation % 98 %     3 Minute Liters of Oxygen 0 L     4 Minute Oxygen Saturation % 100 %     4 Minute Liters of Oxygen 0 L     5 Minute Oxygen Saturation % 100 %     5 Minute Liters of Oxygen 0 L     6 Minute Oxygen Saturation % 98 %     6 Minute Liters of Oxygen 0 L     2  Minute Post Oxygen Saturation % 99 %     2 Minute Post Liters of Oxygen 0 L              Oxygen Initial Assessment:  Oxygen Initial Assessment - 06/15/22 1139       Home Oxygen   Home Oxygen Device Home Concentrator;E-Tanks    Sleep Oxygen Prescription CPAP    Home Exercise Oxygen Prescription Continuous    Liters per minute 2    Compliance with Home Oxygen Use Yes      Initial 6 min Walk   Oxygen Used None      Program Oxygen Prescription   Program Oxygen Prescription None      Intervention   Short Term Goals To learn and understand importance of maintaining oxygen saturations>88%;To learn and exhibit compliance with exercise, home and travel O2 prescription;To learn and demonstrate proper use of respiratory medications;To learn and understand importance of monitoring SPO2 with pulse oximeter and demonstrate accurate use of the pulse oximeter.;To learn and demonstrate proper pursed lip breathing techniques or other breathing techniques.     Long  Term Goals Exhibits compliance with exercise, home  and travel O2 prescription;Maintenance of O2 saturations>88%;Compliance with respiratory medication;Verbalizes importance of monitoring SPO2  with pulse oximeter and return demonstration;Exhibits proper breathing techniques, such as pursed lip breathing or other method taught during program session             Oxygen Re-Evaluation:  Oxygen Re-Evaluation     Row Name 06/21/22 1601 07/12/22 1618 08/17/22 0822         Program Oxygen Prescription   Program Oxygen Prescription None None None       Home Oxygen   Home Oxygen Device Home Concentrator;E-Tanks Home Concentrator;E-Tanks Home Concentrator;E-Tanks     Sleep Oxygen Prescription CPAP CPAP CPAP     Home Exercise Oxygen Prescription Continuous Continuous Continuous     Liters per minute '2 2 2     '$ Compliance with Home Oxygen Use Yes Yes Yes       Goals/Expected Outcomes   Short Term Goals To learn and understand importance of maintaining oxygen saturations>88%;To learn and exhibit compliance with exercise, home and travel O2 prescription;To learn and demonstrate proper use of respiratory medications;To learn and understand importance of monitoring SPO2 with pulse oximeter and demonstrate accurate use of the pulse oximeter.;To learn and demonstrate proper pursed lip breathing techniques or other breathing techniques.  To learn and understand importance of maintaining oxygen saturations>88%;To learn and exhibit compliance with exercise, home and travel O2 prescription;To learn and demonstrate proper use of respiratory medications;To learn and understand importance of monitoring SPO2 with pulse oximeter and demonstrate accurate use of the pulse oximeter.;To learn and demonstrate proper pursed lip breathing techniques or other breathing techniques.  To learn and understand importance of maintaining oxygen saturations>88%;To learn and exhibit compliance with exercise, home and travel O2 prescription;To learn and demonstrate proper use of respiratory medications;To learn and understand importance of monitoring SPO2 with pulse oximeter and demonstrate accurate use of the pulse  oximeter.;To learn and demonstrate proper pursed lip breathing techniques or other breathing techniques.      Long  Term Goals Exhibits compliance with exercise, home  and travel O2 prescription;Maintenance of O2 saturations>88%;Compliance with respiratory medication;Verbalizes importance of monitoring SPO2 with pulse oximeter and return demonstration;Exhibits proper breathing techniques, such as pursed lip breathing or other method taught during program session Exhibits compliance with exercise, home  and travel O2 prescription;Maintenance of O2 saturations>88%;Compliance with respiratory  medication;Verbalizes importance of monitoring SPO2 with pulse oximeter and return demonstration;Exhibits proper breathing techniques, such as pursed lip breathing or other method taught during program session Exhibits compliance with exercise, home  and travel O2 prescription;Maintenance of O2 saturations>88%;Compliance with respiratory medication;Verbalizes importance of monitoring SPO2 with pulse oximeter and return demonstration;Exhibits proper breathing techniques, such as pursed lip breathing or other method taught during program session     Goals/Expected Outcomes Compliance and understanding of oxygen saturation monitoring and breathing techniques to decrease shortness of breath. Compliance and understanding of oxygen saturation monitoring and breathing techniques to decrease shortness of breath. Compliance and understanding of oxygen saturation monitoring and breathing techniques to decrease shortness of breath.              Oxygen Discharge (Final Oxygen Re-Evaluation):  Oxygen Re-Evaluation - 08/17/22 0822       Program Oxygen Prescription   Program Oxygen Prescription None      Home Oxygen   Home Oxygen Device Home Concentrator;E-Tanks    Sleep Oxygen Prescription CPAP    Home Exercise Oxygen Prescription Continuous    Liters per minute 2    Compliance with Home Oxygen Use Yes       Goals/Expected Outcomes   Short Term Goals To learn and understand importance of maintaining oxygen saturations>88%;To learn and exhibit compliance with exercise, home and travel O2 prescription;To learn and demonstrate proper use of respiratory medications;To learn and understand importance of monitoring SPO2 with pulse oximeter and demonstrate accurate use of the pulse oximeter.;To learn and demonstrate proper pursed lip breathing techniques or other breathing techniques.     Long  Term Goals Exhibits compliance with exercise, home  and travel O2 prescription;Maintenance of O2 saturations>88%;Compliance with respiratory medication;Verbalizes importance of monitoring SPO2 with pulse oximeter and return demonstration;Exhibits proper breathing techniques, such as pursed lip breathing or other method taught during program session    Goals/Expected Outcomes Compliance and understanding of oxygen saturation monitoring and breathing techniques to decrease shortness of breath.             Initial Exercise Prescription:  Initial Exercise Prescription - 06/15/22 1100       Date of Initial Exercise RX and Referring Provider   Date 06/15/22    Referring Provider Brandy [name]    Expected Discharge Date 08/18/22      Recumbant Elliptical   Level 1    Minutes 15    METs 2      Track   Minutes 15    METs 3.6      Prescription Details   Frequency (times per week) 2    Duration Progress to 30 minutes of continuous aerobic without signs/symptoms of physical distress      Intensity   THRR 40-80% of Max Heartrate 65-131    Ratings of Perceived Exertion 11-13    Perceived Dyspnea 0-4      Progression   Progression Continue progressive overload as per policy without signs/symptoms or physical distress.      Resistance Training   Training Prescription Yes    Weight red bands    Reps 10-15             Perform Capillary Blood Glucose checks as needed.  Exercise Prescription Changes:    Exercise Prescription Changes     Row Name 06/21/22 1200 07/05/22 1200 07/14/22 1201 07/14/22 1224 07/15/22 0900     Response to Exercise   Blood Pressure (Admit) 132/80 98/52 138/80 138/60 --   Blood Pressure (Exercise) 132/68  112/60 -- -- --   Blood Pressure (Exit) 120/80 112/58 114/60 114/60 --   Heart Rate (Admit) 117 bpm 89 bpm 102 bpm 102 bpm --   Heart Rate (Exercise) 127 bpm 114 bpm 108 bpm 108 bpm --   Heart Rate (Exit) 112 bpm 116 bpm 107 bpm 107 bpm --   Oxygen Saturation (Admit) 98 % 100 % 98 % 98 % --   Oxygen Saturation (Exercise) 95 % 98 % 95 % 98 % --   Oxygen Saturation (Exit) 100 % 97 % 100 % 100 % --   Rating of Perceived Exertion (Exercise) '11 13 14 12 '$ --   Perceived Dyspnea (Exercise) 0 '1 3 2 '$ --   Duration Progress to 30 minutes of  aerobic without signs/symptoms of physical distress Progress to 30 minutes of  aerobic without signs/symptoms of physical distress Progress to 30 minutes of  aerobic without signs/symptoms of physical distress Progress to 30 minutes of  aerobic without signs/symptoms of physical distress --   Intensity THRR unchanged THRR unchanged THRR unchanged THRR unchanged --     Progression   Progression Continue to progress workloads to maintain intensity without signs/symptoms of physical distress. Continue to progress workloads to maintain intensity without signs/symptoms of physical distress. Continue to progress workloads to maintain intensity without signs/symptoms of physical distress. Continue to progress workloads to maintain intensity without signs/symptoms of physical distress. --     Horticulturist, commercial Prescription Yes Yes Yes Yes --   Weight red bands red bands red bands red bands --   Reps 10-15 10-15 10-15 10-15 --   Time 10 Minutes 10 Minutes 10 Minutes 10 Minutes --     NuStep   Level '1 3 2 3 '$ --   SPM 60 60 60 60 --   Minutes '15 15 15 15 '$ --   METs 1.8 1.'9 2 2 '$ --     Recumbant Elliptical   Level 2 -- -- -- --    Minutes 15 -- -- -- --   METs 3.6 -- -- -- --     Track   Laps -- -- 6 6 --   Minutes -- '15 15 15 '$ --   METs -- 2.54 1.92 1.92 --     Home Exercise Plan   Plans to continue exercise at -- -- -- -- Home (comment)   Frequency -- -- -- -- --  N/A   Initial Home Exercises Provided -- -- -- -- 07/15/22            Exercise Comments:   Exercise Comments     Row Name 06/21/22 1207 07/15/22 0928         Exercise Comments Pt completed first day of exercise. She exercised for 13 min on the octane and 15 min on the Nustep. Brandy [name] stated that the octane hurt her buttocks. She averaged 3.6 METs at level 2 on the octane and 1.8 METs at level 1. Brandy [name] performed the warmup and cooldown standing/ seated based on her pain and shortness of breath. Discussed METs and how to increase METs. Completed home exercise plan. Brandy [name] is currently exercising at home. She exercises to walking video 2-3 non-rehab days/wk for about 60 min/day with rest breaks. I told Brandy [name] that I was satisfied with her current home exercise. Brandy [name] mentioned that she wished to join a fitness center to do exercise classes. 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 I encouraged Brandy [name] to call the [employer] to  discuss different class  offerings. Bitania agreed with my recommendations. I will follow up with her to see if she has called the [facility]. Brandy [name] is very motivated to exercise and improve her functional capacity.               Exercise Goals and Review:   Exercise Goals     Row Name 06/15/22 1017 06/21/22 1555 07/12/22 1613 08/17/22 0822       Exercise Goals   Increase Physical Activity Yes Yes Yes Yes    Intervention Provide advice, education, support and counseling about physical activity/exercise needs.;Develop an individualized exercise prescription for aerobic and resistive training based on initial evaluation findings, risk stratification, comorbidities and participant's personal goals. Provide advice,  education, support and counseling about physical activity/exercise needs.;Develop an individualized exercise prescription for aerobic and resistive training based on initial evaluation findings, risk stratification, comorbidities and participant's personal goals. Provide advice, education, support and counseling about physical activity/exercise needs.;Develop an individualized exercise prescription for aerobic and resistive training based on initial evaluation findings, risk stratification, comorbidities and participant's personal goals. Provide advice, education, support and counseling about physical activity/exercise needs.;Develop an individualized exercise prescription for aerobic and resistive training based on initial evaluation findings, risk stratification, comorbidities and participant's personal goals.    Expected Outcomes Short Term: Attend rehab on a regular basis to increase amount of physical activity.;Long Term: Exercising regularly at least 3-5 days a week.;Long Term: Add in home exercise to make exercise part of routine and to increase amount of physical activity. Short Term: Attend rehab on a regular basis to increase amount of physical activity.;Long Term: Exercising regularly at least 3-5 days a week.;Long Term: Add in home exercise to make exercise part of routine and to increase amount of physical activity. Short Term: Attend rehab on a regular basis to increase amount of physical activity.;Long Term: Exercising regularly at least 3-5 days a week.;Long Term: Add in home exercise to make exercise part of routine and to increase amount of physical activity. Short Term: Attend rehab on a regular basis to increase amount of physical activity.;Long Term: Exercising regularly at least 3-5 days a week.;Long Term: Add in home exercise to make exercise part of routine and to increase amount of physical activity.    Increase Strength and Stamina Yes Yes Yes Yes    Intervention Provide advice,  education, support and counseling about physical activity/exercise needs.;Develop an individualized exercise prescription for aerobic and resistive training based on initial evaluation findings, risk stratification, comorbidities and participant's personal goals. Provide advice, education, support and counseling about physical activity/exercise needs.;Develop an individualized exercise prescription for aerobic and resistive training based on initial evaluation findings, risk stratification, comorbidities and participant's personal goals. Provide advice, education, support and counseling about physical activity/exercise needs.;Develop an individualized exercise prescription for aerobic and resistive training based on initial evaluation findings, risk stratification, comorbidities and participant's personal goals. Provide advice, education, support and counseling about physical activity/exercise needs.;Develop an individualized exercise prescription for aerobic and resistive training based on initial evaluation findings, risk stratification, comorbidities and participant's personal goals.    Expected Outcomes Short Term: Increase workloads from initial exercise prescription for resistance, speed, and METs.;Short Term: Perform resistance training exercises routinely during rehab and add in resistance training at home;Long Term: Improve cardiorespiratory fitness, muscular endurance and strength as measured by increased METs and functional capacity (6MWT) Short Term: Increase workloads from initial exercise prescription for resistance, speed, and METs.;Short Term: Perform resistance training exercises routinely during rehab and add in  resistance training at home;Long Term: Improve cardiorespiratory fitness, muscular endurance and strength as measured by increased METs and functional capacity (6MWT) Short Term: Increase workloads from initial exercise prescription for resistance, speed, and METs.;Short Term: Perform  resistance training exercises routinely during rehab and add in resistance training at home;Long Term: Improve cardiorespiratory fitness, muscular endurance and strength as measured by increased METs and functional capacity (6MWT) Short Term: Increase workloads from initial exercise prescription for resistance, speed, and METs.;Short Term: Perform resistance training exercises routinely during rehab and add in resistance training at home;Long Term: Improve cardiorespiratory fitness, muscular endurance and strength as measured by increased METs and functional capacity (6MWT)    Able to understand and use rate of perceived exertion (RPE) scale Yes Yes Yes Yes    Intervention Provide education and explanation on how to use RPE scale Provide education and explanation on how to use RPE scale Provide education and explanation on how to use RPE scale Provide education and explanation on how to use RPE scale    Expected Outcomes Short Term: Able to use RPE daily in rehab to express subjective intensity level;Long Term: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 diseases with examples of each.    Oxygen Safety Group instruction provided by PowerPoint, verbal discussion, and written material  to support subject matter. There is an overview of "What is Oxygen" and "Why do we need it".  Instructor also reviews how to create a safe environment for oxygen use, the importance of using oxygen as prescribed, and the risks of noncompliance. There is a brief discussion on traveling with oxygen and resources the patient Brandy [name] utilize. [place]  OTHER RESPIRATORY from 06/30/2022 in [facility] for Heart, Vascular, & Lung Health  Date 06/30/22  Educator EP  Instruction Review Code 1- Verbalizes Understanding       Oxygen Use Group instruction provided by PowerPoint, verbal discussion, and written material to discuss how supplemental oxygen is prescribed and different types of oxygen supply systems. Resources for more information are provided.  Flowsheet Row PULMONARY REHAB OTHER RESPIRATORY from 07/07/2022 in [facility] for Heart, Vascular, & Lung Health  Date 07/07/22  Educator EP  Instruction Review Code 1- Verbalizes Understanding       Breathing Techniques Group instruction that is supported by demonstration and informational handouts. Instructor discusses the benefits of pursed lip and diaphragmatic breathing and detailed demonstration on how to perform both.  Flowsheet Row PULMONARY REHAB OTHER RESPIRATORY from 07/14/2022 in [facility] for Heart, Vascular, & Lung Health  Date 07/14/22  Educator EP  Instruction Review Code 1- Verbalizes Understanding        Risk Factor Reduction Group instruction that is supported by a PowerPoint presentation. Instructor discusses the definition of a risk factor, different risk factors for pulmonary disease, and how the heart and lungs work together.   MD Day A group question and answer session with a medical doctor that allows participants to ask questions that relate to their pulmonary disease state.   Nutrition for the Pulmonary Patient Group  instruction provided by PowerPoint slides, verbal discussion, and written materials to support subject matter. The instructor gives an explanation and review of healthy diet recommendations, which includes a discussion on weight management, recommendations for fruit and vegetable consumption, as well as protein, fluid, caffeine, fiber, sodium, sugar, and alcohol. Tips for eating when patients are short of breath are discussed.    Other Education Group or individual verbal, written, or video instructions that support the educational goals of the pulmonary rehab program.    Knowledge Questionnaire Score:  Knowledge Questionnaire Score - 06/15/22 0950       Knowledge Questionnaire Score   Pre Score 16/18             Core Components/Risk Factors/Patient Goals at Admission:  Personal Goals and Risk Factors at Admission - 06/15/22 1014       Core Components/Risk Factors/Patient Goals on Admission    Weight Management Weight Loss;Yes    Intervention Weight Management: Provide education and appropriate resources to help participant work on and attain dietary goals.;Weight Management/Obesity: Establish reasonable short term and long term weight goals.;Obesity: Provide education and appropriate resources to help participant work on and attain dietary goals.    Admit Weight 197 lb 8 oz (89.6 kg)    Goal Weight: Short Term 187 lb (84.8 kg)    Goal Weight: Long Term 140 lb (63.5 kg)    Expected Outcomes Short Term: Continue to assess and modify interventions until short term weight is achieved;Long Term: Adherence to nutrition and physical activity/exercise program aimed toward attainment of established weight goal;Weight Loss: Understanding of general recommendations for a balanced deficit meal plan, which promotes 1-2 lb weight loss per week and includes a negative energy balance of [phone] kcal/d    Improve shortness of breath with ADL's Yes    Intervention Provide education, individualized  exercise plan and daily activity instruction to help decrease symptoms of SOB with activities of daily living.    Expected Outcomes Short Term: Improve cardiorespiratory fitness to achieve a reduction of symptoms when performing ADLs;Long Term:  Be able to perform more ADLs without symptoms or delay the onset of symptoms    Increase knowledge of respiratory medications and ability to use respiratory devices properly  Yes    Intervention Provide education and demonstration as needed of appropriate use of medications, inhalers, and oxygen therapy.    Expected Outcomes Short Term: Achieves understanding of medications use. Understands that oxygen is a medication prescribed by physician. Demonstrates appropriate use of inhaler and oxygen therapy.;Long Term: Maintain appropriate use of medications, inhalers, and oxygen therapy.    Stress Yes    Intervention Offer individual and/or small group education and counseling on adjustment to heart disease, stress management and health-related lifestyle change. Teach and support self-help strategies.;Refer participants experiencing significant psychosocial distress to appropriate mental health specialists for further evaluation and treatment. When possible, include family members and significant others in education/counseling sessions.    Expected Outcomes Short Term: Participant demonstrates changes in health-related behavior, relaxation and other stress management skills, ability to obtain effective social support, and compliance with psychotropic medications if prescribed.;Long Term: Emotional wellbeing is indicated by absence of clinically significant psychosocial distress or social isolation.             Core Components/Risk Factors/Patient Goals Review:   Goals and Risk Factor Review     Row Name 06/20/22 1057 07/13/22 1435 08/12/22 1226         Core Components/Risk Factors/Patient Goals Review   Personal Goals Review Weight Management/Obesity;Increase  knowledge of respiratory medications and ability to use respiratory devices properly.;Stress;Improve shortness of breath with ADL's;Develop more efficient breathing techniques such as purse lipped breathing and diaphragmatic breathing and practicing self-pacing with activity. Weight Management/Obesity;Increase knowledge of respiratory medications and ability to use respiratory devices properly.;Stress;Improve shortness of breath with ADL's;Develop more efficient breathing techniques such as purse lipped breathing and diaphragmatic breathing and practicing self-pacing with activity. Weight Management/Obesity;Increase knowledge of respiratory medications and ability to use respiratory devices properly.;Stress;Improve shortness of breath with ADL's;Develop more efficient breathing techniques such as purse lipped breathing and diaphragmatic breathing and practicing self-pacing with activity.     Review Corynn has not started PR class yet. She is scheduled to start on 1/16. We look forward to educating and teaching Cella about the above topics. Dameshia has completed 6 PR classes so far. She enjoys working out on [employer] and walking the track. She has been able to increase her workload and METs on the NuStep and increase her laps on the track. She knows how to report her Rate of Perceived Exertion and her Dyspnea scale to staff. Azaliah has attended oxygen use and oxygen safety classes. Alexy is also working with the nutrionist to achieve weight loss. Donnamae enjoys coming to PR and we will continue to work with Brandy [name] to obtain her goals. Arietta has completed 7 PR classes so far. She has been working out on [employer] and walking the track. She has been able to increase her workload and METs on the NuStep and increase her laps on the track. She knows how to report her Rate of Perceived Exertion and her Dyspnea scale to staff. Codey has attended oxygen use and oxygen safety classes. Zada is also working with the  nutritionist to achieve weight loss though we have not monitored her for the last month while she was hospitalized. We expect Atlean to come back to class next week and will continue to work with Brandy [name] to achieve her goals.     Expected Outcomes See Admission Goals See  Admission Goals See Admission Goals              Core Components/Risk Factors/Patient Goals at Discharge (Final Review):   Goals and Risk Factor Review - 08/12/22 1226       Core Components/Risk Factors/Patient Goals Review   Personal Goals Review Weight Management/Obesity;Increase knowledge of respiratory medications and ability to use respiratory devices properly.;Stress;Improve shortness of breath with ADL's;Develop more efficient breathing techniques such as purse lipped breathing and diaphragmatic breathing and practicing self-pacing with activity.    Review Yamira has completed 7 PR classes so far. She has been working out on [employer] and walking the track. She has been able to increase her workload and METs on the NuStep and increase her laps on the track. She knows how to report her Rate of Perceived Exertion and her Dyspnea scale to staff. Yadhira has attended oxygen use and oxygen safety classes. Aniyah is also working with the nutritionist to achieve weight loss though we have not monitored her for the last month while she was hospitalized. We expect Karlesha to come back to class next week and will continue to work with Brandy [name] to achieve her goals.    Expected Outcomes See Admission Goals             ITP Comments:   Comments: Dr. Rodman Pickle is Medical Director for Pulmonary Rehab at [facility].

## 2022-08-17 NOTE — Addendum Note (Signed)
Encounter addended by: Vilinda Blanks, RRT on: 08/17/2022 9:56 AM  Actions taken: Clinical Note Signed, Delete clinical note

## 2022-08-17 NOTE — Addendum Note (Signed)
Encounter addended by: Sheppard Plumber on: 08/17/2022 8:25 AM  Actions taken: Flowsheet data copied forward, Flowsheet accepted

## 2022-08-18 ENCOUNTER — Other Ambulatory Visit: Payer: Self-pay | Admitting: *Deleted

## 2022-08-18 ENCOUNTER — Encounter (HOSPITAL_COMMUNITY)
Admission: RE | Admit: 2022-08-18 | Discharge: 2022-08-18 | Disposition: A | Discharge status: Home / Self Care | Payer: Medicare Other | Source: Ambulatory Visit | Attending: Pulmonary Disease | Admitting: Pulmonary Disease

## 2022-08-18 ENCOUNTER — Other Ambulatory Visit: Payer: Self-pay

## 2022-08-18 ENCOUNTER — Encounter (HOSPITAL_COMMUNITY): Payer: Medicare Other

## 2022-08-18 DIAGNOSIS — C50919 Malignant neoplasm of unspecified site of unspecified female breast: Secondary | ICD-10-CM

## 2022-08-18 DIAGNOSIS — R0602 Shortness of breath: Secondary | ICD-10-CM | POA: Insufficient documentation

## 2022-08-18 NOTE — Progress Notes (Signed)
Daily Session Note  Patient Details  Name: Brandy [name] MRN: [id number] Date of Birth: 19-Dec-1965 Referring Provider:   April Manson Pulmonary Rehab Walk Test from 06/15/2022 in [name] for Heart, Vascular, & Lung [facility]  Referring Provider [name]       Encounter Date: 08/18/2022  Check In:  Session Check In - 08/18/22 1159       Check-In   Supervising physician immediately available to respond to emergencies [facility] MD immediately available    Physician(s) Dr Gala Romney    Location [facility]    Staff Present Janine Ores, RN, BSN;Randi Olen Cordial BS, ACSM-CEP, Exercise Physiologist;Kaylee Rosana Hoes, MS, ACSM-CEP, Exercise Physiologist;Samantha [place], RD, LDN;Other    Virtual Visit No    Medication changes reported     No    Fall or balance concerns reported    No    Tobacco Cessation No Change    Warm-up and Cool-down Performed as group-led instruction    Resistance Training Performed Yes    VAD Patient? No    PAD/SET Patient? No      Pain Assessment   Currently in Pain? No/denies    Multiple Pain Sites No             Capillary Blood Glucose: No results found for this or any previous visit (from the past 24 hour(s)).    Social History   Tobacco Use  Smoking Status Former   Packs/day: 0.25   Years: 25.00   Additional pack years: 0.00   Total pack years: 6.25   Types: Cigarettes   Quit date: 02/04/2018   Years since quitting: 4.5  Smokeless Tobacco Never    Goals Met:  Proper associated with RPD/PD & O2 Sat Independence with exercise equipment Exercise tolerated well No report of concerns or symptoms today Strength training completed today  Goals Unmet:  Not Applicable  Comments: Service time is from 1002 to 1145.    Dr. Rodman Pickle is Medical Director for Pulmonary Rehab at [facility].

## 2022-08-19 ENCOUNTER — Encounter: Payer: Self-pay | Admitting: Hematology

## 2022-08-19 ENCOUNTER — Other Ambulatory Visit: Payer: Self-pay

## 2022-08-19 ENCOUNTER — Inpatient Hospital Stay: Payer: Medicare Other

## 2022-08-19 ENCOUNTER — Inpatient Hospital Stay: Payer: Medicare Other | Attending: Hematology

## 2022-08-19 VITALS — BP 150/73 | HR 120 | Temp 98.7°F | Resp 18

## 2022-08-19 DIAGNOSIS — G43909 Migraine, unspecified, not intractable, without status migrainosus: Secondary | ICD-10-CM | POA: Diagnosis not present

## 2022-08-19 DIAGNOSIS — Z170 Estrogen receptor positive status [ER+]: Secondary | ICD-10-CM | POA: Insufficient documentation

## 2022-08-19 DIAGNOSIS — Z87891 Personal history of nicotine dependence: Secondary | ICD-10-CM | POA: Diagnosis not present

## 2022-08-19 DIAGNOSIS — C7802 Secondary malignant neoplasm of left lung: Secondary | ICD-10-CM | POA: Diagnosis not present

## 2022-08-19 DIAGNOSIS — C50919 Malignant neoplasm of unspecified site of unspecified female breast: Secondary | ICD-10-CM

## 2022-08-19 DIAGNOSIS — Z8249 Family history of ischemic heart disease and other diseases of the circulatory system: Secondary | ICD-10-CM | POA: Diagnosis not present

## 2022-08-19 DIAGNOSIS — I700 Atherosclerosis of aorta: Secondary | ICD-10-CM | POA: Insufficient documentation

## 2022-08-19 DIAGNOSIS — C50511 Malignant neoplasm of lower-outer quadrant of right female breast: Secondary | ICD-10-CM | POA: Insufficient documentation

## 2022-08-19 DIAGNOSIS — Z833 Family history of diabetes mellitus: Secondary | ICD-10-CM | POA: Insufficient documentation

## 2022-08-19 DIAGNOSIS — Z8261 Family history of arthritis: Secondary | ICD-10-CM | POA: Diagnosis not present

## 2022-08-19 DIAGNOSIS — Z801 Family history of malignant neoplasm of trachea, bronchus and lung: Secondary | ICD-10-CM | POA: Insufficient documentation

## 2022-08-19 DIAGNOSIS — R110 Nausea: Secondary | ICD-10-CM | POA: Insufficient documentation

## 2022-08-19 DIAGNOSIS — K219 Gastro-esophageal reflux disease without esophagitis: Secondary | ICD-10-CM | POA: Insufficient documentation

## 2022-08-19 DIAGNOSIS — Z5111 Encounter for antineoplastic chemotherapy: Secondary | ICD-10-CM | POA: Diagnosis not present

## 2022-08-19 DIAGNOSIS — E876 Hypokalemia: Secondary | ICD-10-CM | POA: Insufficient documentation

## 2022-08-19 DIAGNOSIS — K760 Fatty (change of) liver, not elsewhere classified: Secondary | ICD-10-CM | POA: Diagnosis not present

## 2022-08-19 DIAGNOSIS — C7801 Secondary malignant neoplasm of right lung: Secondary | ICD-10-CM | POA: Insufficient documentation

## 2022-08-19 LAB — CMP (CANCER CENTER ONLY)
ALT: 35 U/L (ref 0–44)
AST: 42 U/L — ABNORMAL HIGH (ref 15–41)
Albumin: 3.4 g/dL — ABNORMAL LOW (ref 3.5–5.0)
Alkaline Phosphatase: 229 U/L — ABNORMAL HIGH (ref 38–126)
Anion gap: 7 (ref 5–15)
BUN: 10 mg/dL (ref 6–20)
CO2: 30 mmol/L (ref 22–32)
Calcium: 9 mg/dL (ref 8.9–10.3)
Chloride: 105 mmol/L (ref 98–111)
Creatinine: 1.28 mg/dL — ABNORMAL HIGH (ref 0.44–1.00)
GFR, Estimated: 49 mL/min — ABNORMAL LOW (ref >60)
Glucose, Bld: 108 mg/dL — ABNORMAL HIGH (ref 70–99)
Potassium: 3.8 mmol/L (ref 3.5–5.1)
Sodium: 142 mmol/L (ref 135–145)
Total Bilirubin: 1 mg/dL (ref 0.3–1.2)
Total Protein: 6.9 g/dL (ref 6.5–8.1)

## 2022-08-19 LAB — CBC WITH DIFFERENTIAL (CANCER CENTER ONLY)
Abs Immature Granulocytes: 0.01 10*3/uL (ref 0.00–0.07)
Basophils Absolute: 0 10*3/uL (ref 0.0–0.1)
Basophils Relative: 0 %
Eosinophils Absolute: 0.1 10*3/uL (ref 0.0–0.5)
Eosinophils Relative: 3 %
HCT: 32.7 % — ABNORMAL LOW (ref 36.0–46.0)
Hemoglobin: 10.8 g/dL — ABNORMAL LOW (ref 12.0–15.0)
Immature Granulocytes: 0 %
Lymphocytes Relative: 52 %
Lymphs Abs: 2.4 10*3/uL (ref 0.7–4.0)
MCH: 35.5 pg — ABNORMAL HIGH (ref 26.0–34.0)
MCHC: 33 g/dL (ref 30.0–36.0)
MCV: 107.6 fL — ABNORMAL HIGH (ref 80.0–100.0)
Monocytes Absolute: 0.3 10*3/uL (ref 0.1–1.0)
Monocytes Relative: 7 %
Neutro Abs: 1.8 10*3/uL (ref 1.7–7.7)
Neutrophils Relative %: 38 %
Platelet Count: 216 10*3/uL (ref 150–400)
RBC: 3.04 MIL/uL — ABNORMAL LOW (ref 3.87–5.11)
RDW: 13.5 % (ref 11.5–15.5)
WBC Count: 4.7 10*3/uL (ref 4.0–10.5)
nRBC: 0 % (ref 0.0–0.2)

## 2022-08-19 MED ORDER — MIDODRINE HCL 10 MG PO TABS: 10.0000 mg | ORAL_TABLET | Freq: Three times a day (TID) | ORAL | 2 refills | Status: DC | PRN

## 2022-08-19 MED ORDER — FULVESTRANT 250 MG/5ML IM SOSY
500.0000 mg | PREFILLED_SYRINGE | Freq: Once | INTRAMUSCULAR | Status: AC
  Administered 2022-08-19: 500 mg via INTRAMUSCULAR
  Filled 2022-08-19: qty 10

## 2022-08-21 ENCOUNTER — Encounter: Payer: Self-pay | Admitting: Hematology

## 2022-08-23 ENCOUNTER — Encounter (HOSPITAL_COMMUNITY)
Admission: RE | Admit: 2022-08-23 | Discharge: 2022-08-23 | Disposition: A | Discharge status: Home / Self Care | Payer: Medicare Other | Source: Ambulatory Visit | Attending: Pulmonary Disease | Admitting: Pulmonary Disease

## 2022-08-23 VITALS — Wt 197.5 lb

## 2022-08-23 DIAGNOSIS — R0602 Shortness of breath: Secondary | ICD-10-CM | POA: Diagnosis not present

## 2022-08-23 NOTE — Progress Notes (Signed)
Daily Session Note  Patient Details  Name: Brandy [name] MRN: [id number] Date of Birth: 1966-01-21 Referring Provider:   April Manson Pulmonary Rehab Walk Test from 06/15/2022 in [facility] for Heart, Vascular, & Lung Health  Referring Provider [name]       Encounter Date: 08/23/2022  Check In:  Session Check In - 08/23/22 1228       Check-In   Supervising physician immediately available to respond to emergencies [facility] MD immediately available    Physician(s) Eric Form, NP    [facility]    Staff Present Maurice Small, RN, Luisa Hart, RN, Quentin Ore, MS, ACSM-CEP, Exercise Physiologist;Samantha [place], RD, LDN;Other    Virtual Visit No    Medication changes reported     No    Fall or balance concerns reported    No    Tobacco Cessation No Change    Warm-up and Cool-down Performed as group-led instruction    Resistance Training Performed Yes    VAD Patient? No    PAD/SET Patient? No      Pain Assessment   Currently in Pain? No/denies    Multiple Pain Sites No             Capillary Blood Glucose: No results found for this or any previous visit (from the past 24 hour(s)).   Exercise Prescription Changes - 08/23/22 1200       Response to Exercise   Blood Pressure (Admit) 134/70    Blood Pressure (Exercise) 130/72    Blood Pressure (Exit) 140/76    Heart Rate (Admit) 132 bpm    Heart Rate (Exercise) 93 bpm    Heart Rate (Exit) 117 bpm    Oxygen Saturation (Admit) 98 %    Oxygen Saturation (Exercise) 96 %    Oxygen Saturation (Exit) 98 %    Rating of Perceived Exertion (Exercise) 13    Perceived Dyspnea (Exercise) 1    Duration Progress to 30 minutes of  aerobic without signs/symptoms of physical distress    Intensity THRR unchanged      Progression   Progression Continue to progress workloads to maintain intensity without signs/symptoms of physical distress.      Resistance  Training   Training Prescription Yes    Weight red bands    Reps 10-15    Time 10 Minutes      NuStep   Level 3    SPM 60    Minutes 15    METs 1.8      Track   Laps 5    Minutes 8    METs 1.77             Social History   Tobacco Use  Smoking Status Former   Packs/day: 0.25   Years: 25.00   Additional pack years: 0.00   Total pack years: 6.25   Types: Cigarettes   Quit date: 02/04/2018   Years since quitting: 4.5  Smokeless Tobacco Never    Goals Met:  Proper associated with RPD/PD & O2 Sat Independence with exercise equipment Exercise tolerated well No report of concerns or symptoms today Strength training completed today  Goals Unmet:  Not Applicable  Comments: Service time is from 1008 to 1145.    Dr. Rodman Pickle is Medical Director for Pulmonary Rehab at [facility].

## 2022-08-25 ENCOUNTER — Encounter (HOSPITAL_COMMUNITY): Payer: Medicare Other

## 2022-08-25 ENCOUNTER — Telehealth (HOSPITAL_COMMUNITY): Payer: Self-pay

## 2022-08-25 DIAGNOSIS — R0600 Dyspnea, unspecified: Secondary | ICD-10-CM | POA: Diagnosis not present

## 2022-08-25 NOTE — Telephone Encounter (Signed)
Called pt to check on her since she missed PR. No answer. Voicemail was left.

## 2022-08-26 DIAGNOSIS — U071 COVID-19: Secondary | ICD-10-CM | POA: Diagnosis not present

## 2022-08-26 DIAGNOSIS — J9611 Chronic respiratory failure with hypoxia: Secondary | ICD-10-CM | POA: Diagnosis not present

## 2022-08-26 DIAGNOSIS — N1832 Chronic kidney disease, stage 3b: Secondary | ICD-10-CM | POA: Diagnosis not present

## 2022-08-26 DIAGNOSIS — A419 Sepsis, unspecified organism: Secondary | ICD-10-CM | POA: Diagnosis not present

## 2022-08-26 DIAGNOSIS — I129 Hypertensive chronic kidney disease with stage 1 through stage 4 chronic kidney disease, or unspecified chronic kidney disease: Secondary | ICD-10-CM | POA: Diagnosis not present

## 2022-08-30 ENCOUNTER — Encounter (HOSPITAL_COMMUNITY)
Admission: RE | Admit: 2022-08-30 | Discharge: 2022-08-30 | Disposition: A | Discharge status: Home / Self Care | Payer: Medicare Other | Source: Ambulatory Visit | Attending: Pulmonary Disease | Admitting: Pulmonary Disease

## 2022-08-30 DIAGNOSIS — J9611 Chronic respiratory failure with hypoxia: Secondary | ICD-10-CM | POA: Diagnosis not present

## 2022-08-30 DIAGNOSIS — A419 Sepsis, unspecified organism: Secondary | ICD-10-CM | POA: Diagnosis not present

## 2022-08-30 DIAGNOSIS — I129 Hypertensive chronic kidney disease with stage 1 through stage 4 chronic kidney disease, or unspecified chronic kidney disease: Secondary | ICD-10-CM | POA: Diagnosis not present

## 2022-08-30 DIAGNOSIS — R0602 Shortness of breath: Secondary | ICD-10-CM | POA: Diagnosis not present

## 2022-08-30 DIAGNOSIS — N1832 Chronic kidney disease, stage 3b: Secondary | ICD-10-CM | POA: Diagnosis not present

## 2022-08-30 DIAGNOSIS — U071 COVID-19: Secondary | ICD-10-CM | POA: Diagnosis not present

## 2022-08-30 NOTE — Progress Notes (Signed)
Daily Session Note  Patient Details  Name: Brandy [name] MRN: [id number] Date of Birth: 15-Jan-1966 Referring Provider:   April Manson Pulmonary Rehab Walk Test from 06/15/2022 in [facility] for Heart, Vascular, & [place]  Referring Provider [name]       Encounter Date: 08/30/2022  Check In:  Session Check In - 08/30/22 1132       Check-In   Supervising physician immediately available to respond to emergencies [facility] MD immediately available    Physician(s) Nevada Crane, PA    Location [facility]    Staff Present Janine Ores, RN, BSN;Casey Tamala Julian, Rozanna Box, MS, ACSM-CEP, Exercise Physiologist;Randi Olen Cordial BS, ACSM-CEP, Exercise Physiologist;Samantha [place], RD, LDN    Virtual Visit No    Medication changes reported     No    Fall or balance concerns reported    No    Tobacco Cessation No Change    Warm-up and Cool-down Performed as group-led instruction    Resistance Training Performed Yes    VAD Patient? No    PAD/SET Patient? No      Pain Assessment   Currently in Pain? No/denies             Capillary Blood Glucose: No results found for this or any previous visit (from the past 24 hour(s)).    Social History   Tobacco Use  Smoking Status Former   Packs/day: 0.25   Years: 25.00   Additional pack years: 0.00   Total pack years: 6.25   Types: Cigarettes   Quit date: 02/04/2018   Years since quitting: 4.5  Smokeless Tobacco Never    Goals Met:  Independence with exercise equipment Exercise tolerated well No report of concerns or symptoms today Strength training completed today  Goals Unmet:  Not Applicable  Comments: Service time is from 1009 to [place]    Dr. Rodman Pickle is Medical Director for Pulmonary Rehab at [facility].

## 2022-08-31 ENCOUNTER — Other Ambulatory Visit: Payer: Self-pay | Admitting: Adult Health

## 2022-08-31 DIAGNOSIS — C50919 Malignant neoplasm of unspecified site of unspecified female breast: Secondary | ICD-10-CM

## 2022-08-31 DIAGNOSIS — R0600 Dyspnea, unspecified: Secondary | ICD-10-CM | POA: Diagnosis not present

## 2022-08-31 DIAGNOSIS — F5101 Primary insomnia: Secondary | ICD-10-CM

## 2022-08-31 NOTE — Telephone Encounter (Signed)
Pharmacy requested refill.  Epic LR: 06/09/2022 Contract Date: 05/28/2021 Note Added to upcoming appointment to update.   Pended Rx and sent to [facility] for approval.

## 2022-09-01 ENCOUNTER — Encounter (HOSPITAL_COMMUNITY)
Admission: RE | Admit: 2022-09-01 | Discharge: 2022-09-01 | Disposition: A | Discharge status: Home / Self Care | Payer: Medicare Other | Source: Ambulatory Visit | Attending: Pulmonary Disease | Admitting: Pulmonary Disease

## 2022-09-01 DIAGNOSIS — R0602 Shortness of breath: Secondary | ICD-10-CM

## 2022-09-01 NOTE — Progress Notes (Signed)
Daily Session Note  Patient Details  Name: Brandy [name] MRN: [id number] Date of Birth: Feb 01, 1966 Referring Provider:   April Manson Pulmonary Rehab Walk Test from 06/15/2022 in [facility] for Heart, Vascular, & [place]  Referring Provider [name]       Encounter Date: 09/01/2022  Check In:  Session Check In - 09/01/22 1140       Check-In   Supervising physician immediately available to respond to emergencies [facility] MD immediately available    Physician(s) [place]    [facility]    Staff Present Janine Ores, RN, BSN;Casey Smith, Rozanna Box, MS, ACSM-CEP, Exercise Physiologist;Randi Olen Cordial BS, ACSM-CEP, Exercise Physiologist;Samantha [place], RD, Idalia Needle, MS, Exercise Physiologist;Johnny Starleen Blue, MS, Exercise Physiologist    Virtual Visit No    Medication changes reported     No    Fall or balance concerns reported    No    Tobacco Cessation No Change    Warm-up and Cool-down Performed as group-led instruction    Resistance Training Performed Yes    VAD Patient? No    PAD/SET Patient? No      Pain Assessment   Currently in Pain? No/denies             Capillary Blood Glucose: No results found for this or any previous visit (from the past 24 hour(s)).    Social History   Tobacco Use  Smoking Status Former   Packs/day: 0.25   Years: 25.00   Additional pack years: 0.00   Total pack years: 6.25   Types: Cigarettes   Quit date: 02/04/2018   Years since quitting: 4.5  Smokeless Tobacco Never    Goals Met:  Independence with exercise equipment Exercise tolerated well No report of concerns or symptoms today Strength training completed today  Goals Unmet:  Not Applicable  Comments: Service time is from 1012 to 1145    Dr. Rodman Pickle is Medical Director for Pulmonary Rehab at [facility].

## 2022-09-06 ENCOUNTER — Encounter (HOSPITAL_COMMUNITY)
Admission: RE | Admit: 2022-09-06 | Discharge: 2022-09-06 | Disposition: A | Discharge status: Home / Self Care | Payer: Medicare Other | Source: Ambulatory Visit | Attending: Pulmonary Disease | Admitting: Pulmonary Disease

## 2022-09-06 DIAGNOSIS — R0602 Shortness of breath: Secondary | ICD-10-CM | POA: Insufficient documentation

## 2022-09-06 NOTE — Progress Notes (Signed)
Daily Session Note  Patient Details  Name: Brandy [name] MRN: [id number] Date of Birth: November 06, 1965 Referring Provider:   April Manson Pulmonary Rehab Walk Test from 06/15/2022 in [facility] for Heart, Vascular, & [place]  Referring Provider [name]       Encounter Date: 09/06/2022  Check In:  Session Check In - 09/06/22 1219       Check-In   Supervising physician immediately available to respond to emergencies [facility] MD immediately available    Physician(s) Leggitt    [facility]    Staff Present Dorene Sorrow, Rozanna Box, MS, ACSM-CEP, Exercise Physiologist;Randi Olen Cordial BS, ACSM-CEP, Exercise Physiologist;Samantha [place], RD, LDN;Carlette Wilber Oliphant, RN, BSN    Virtual Visit No    Medication changes reported     No    Fall or balance concerns reported    No    Tobacco Cessation No Change    Warm-up and Cool-down Performed as group-led Higher education careers adviser Performed Yes    VAD Patient? No    PAD/SET Patient? No      Pain Assessment   Currently in Pain? No/denies    Pain Score 0-No pain    Multiple Pain Sites No             Capillary Blood Glucose: No results found for this or any previous visit (from the past 24 hour(s)).    Social History   Tobacco Use  Smoking Status Former   Packs/day: 0.25   Years: 25.00   Additional pack years: 0.00   Total pack years: 6.25   Types: Cigarettes   Quit date: 02/04/2018   Years since quitting: 4.5  Smokeless Tobacco Never    Goals Met:  Proper associated with RPD/PD & O2 Sat Exercise tolerated well No report of concerns or symptoms today Strength training completed today  Goals Unmet:  Not Applicable  Comments: Service time is from 1041 to 1145.    Dr. Rodman Pickle is Medical Director for Pulmonary Rehab at [facility].

## 2022-09-08 ENCOUNTER — Encounter: Payer: Self-pay | Admitting: Hematology

## 2022-09-11 DIAGNOSIS — G4733 Obstructive sleep apnea (adult) (pediatric): Secondary | ICD-10-CM | POA: Diagnosis not present

## 2022-09-12 ENCOUNTER — Other Ambulatory Visit: Payer: Self-pay | Admitting: Hematology

## 2022-09-12 DIAGNOSIS — C50919 Malignant neoplasm of unspecified site of unspecified female breast: Secondary | ICD-10-CM

## 2022-09-13 ENCOUNTER — Encounter: Payer: Self-pay | Admitting: Family

## 2022-09-13 ENCOUNTER — Ambulatory Visit (INDEPENDENT_AMBULATORY_CARE_PROVIDER_SITE_OTHER): Payer: Medicare Other | Admitting: Family

## 2022-09-13 VITALS — BP 98/60 | HR 100 | Temp 97.5°F | Resp 16 | Ht 68.0 in | Wt 197.6 lb

## 2022-09-13 DIAGNOSIS — F411 Generalized anxiety disorder: Secondary | ICD-10-CM

## 2022-09-13 DIAGNOSIS — K219 Gastro-esophageal reflux disease without esophagitis: Secondary | ICD-10-CM | POA: Diagnosis not present

## 2022-09-13 DIAGNOSIS — I951 Orthostatic hypotension: Secondary | ICD-10-CM | POA: Diagnosis not present

## 2022-09-13 DIAGNOSIS — E782 Mixed hyperlipidemia: Secondary | ICD-10-CM

## 2022-09-13 DIAGNOSIS — I10 Essential (primary) hypertension: Secondary | ICD-10-CM

## 2022-09-13 DIAGNOSIS — H6122 Impacted cerumen, left ear: Secondary | ICD-10-CM

## 2022-09-13 MED ORDER — DEBROX 6.5 % OT SOLN: 5.0000 [drp] | Freq: Two times a day (BID) | OTIC | 0 refills | Status: DC

## 2022-09-13 NOTE — Progress Notes (Signed)
Provider: Richarda Bladeinah Josearmando Kuhnert FNP-C   Rehmat Murtagh, Donalee Citrininah C, NP  Patient Care Team: Sho Salguero, Donalee Citrininah C, NP as PCP - General (Family Medicine) Thomasene Rippleobb, Kardie, DO as PCP - Cardiology (Cardiology) Marva PandaMillsaps, Kimberly, NP as Nurse Practitioner  Extended Emergency Contact Information Primary Emergency Contact: [name],Charles Address: [address]          [place], [place] [address] [place] Home Phone: [phone] Relation: Spouse Secondary Emergency Contact: [name],Michele Address: [address]          [place], [place] [address] [place] Home Phone: [phone] Relation: Daughter  Code Status:  Full Code  Goals of care: Advanced Directive information    08/08/2022   12:38 PM  Advanced Directives  Does Patient Have a Medical Advance Directive? No  Would patient like information on creating a medical advance directive? No - Patient declined     Chief Complaint  Patient presents with   Medical Management of Chronic Issues    HPI:  Pt is a 57 y.o. female seen today for 6 months follow up for medical management of chronic diseases.she denies  States continues to follow [facility] with [facility].Has lab work to be done on the 10 th of this month though will need he fasting Lipid panel and TSH level done today since CBC/diff and BMP. States in the process of moving to her own apartment.current place has stairs and not able to use her walker.Also has mildew affecting her breathing.States having marital issues with Husband who does not want to move out.states has been paying for the current housing and Husband not assisting with th bills.drinks and abuses drugs sometimes disappears for days.could not get him to help her after hospital discharge in February.does have her son and daughter whom she can call to assist her when living in her own apartment.denies any meal insecurity.She denies any symptoms of depression.    Due for Tdap    Past Medical History:   Diagnosis Date   Acute pansinusitis 08/02/2017   Anxiety    Arthritis    ASD (atrial septal defect)    s/p closure with Amplatzer device 10/05/04 [facility] 10/05/04   Cancer    Cataract    Chronic pain    CKD (chronic kidney disease)    Dyspnea    Fatty liver 09/04/2019   GERD (gastroesophageal reflux disease)    Heart murmur    no longer heard   History of hiatal hernia    Hyperlipidemia    Legally blind in right eye, as [place] in [place]    Lumbar herniated disc    Migraines    On home oxygen therapy 06/15/2022   Pt [facility]/20[facility]   OSA on CPAP 06/15/2022   PONV (postoperative nausea and vomiting)    Sciatica    Past Surgical History:  Procedure Laterality Date   ABLATION     BREAST LUMPECTOMY WITH RADIOACTIVE SEED AND SENTINEL LYMPH NODE BIOPSY Right 11/23/2020   Procedure: RIGHT BREAST LUMPECTOMY WITH RADIOACTIVE SEED AND RIGHT AXILLARY SENTINEL LYMPH NODE BIOPSY;  Surgeon: Emelia LoronWakefield, Matthew, MD;  Location: [facility] OR;  Service: General;  Laterality: Right;   BREAST SURGERY Bilateral 2011   Breast Reduction Surgery   BUNIONECTOMY     CARDIAC CATHETERIZATION     10/05/04 [facility]): LM < 25%, otherwise normal coronaries. No pulmonary HTN, Mildly enlarged RV. Secundum ASD s/p closure.   CARDIAC SURGERY     CATARACT EXTRACTION  CLEFT PALATE REPAIR     s/p cleft lip and palate repair   EYE SURGERY Right 2019   right eye removed   LUMBAR LAMINECTOMY/DECOMPRESSION MICRODISCECTOMY Left 05/23/2016   Procedure: LEFT L4-L5 LATERAL RECESS DECOMPRESSION WITH CENTRAL AND RIGHT DECOMPRESSION VIA LEFT SIDE;  Surgeon: Kerrin Champagne, MD;  Location: [facility] OR;  Service: Orthopedics;  Laterality: Left;   LUMBAR LAMINECTOMY/DECOMPRESSION MICRODISCECTOMY Left 05/23/2016   Procedure: LUMBAR LAMINECTOMY/DECOMPRESSION MICRODISCECTOMY Lumbar five - Sacral One 1 LEVEL;  Surgeon: Kerrin Champagne, MD;  Location: [facility] OR;  Service: Orthopedics;  Laterality: Left;    REDUCTION MAMMAPLASTY  2011   SHOULDER INJECTION Left 05/23/2016   Procedure: SHOULDER INJECTION;  Surgeon: Kerrin Champagne, MD;  [facility];  Service: Orthopedics;  Laterality: Left;  band-aid per pa-c   TRANSTHORACIC ECHOCARDIOGRAM     12/15/05 [facility]): Mild LVH, EF > 55%, grade 1 diastolic dysfunction, Trivial MR/PR/TR.   TUBAL LIGATION      Allergies  Allergen Reactions   Zithromax [Azithromycin] Shortness Of Breath, Itching and Other (See Comments)    TOTAL BODY ITCHING [EVEN SOLES OF FEET] WHEEZING    Tramadol Itching and Other (See Comments)    Has taken recently without any side effects.   Psyllium Nausea And Vomiting and Other (See Comments)    Metamucil. Sneezing      Allergies as of 09/13/2022       Reactions   Zithromax [azithromycin] Shortness Of Breath, Itching, Other (See Comments)   TOTAL BODY ITCHING [EVEN SOLES OF FEET] WHEEZING   Tramadol Itching, Other (See Comments)   Has taken recently without any side effects.   Psyllium Nausea And Vomiting, Other (See Comments)   Metamucil. Sneezing         Medication List        Accurate as of September 13, 2022  9:00 AM. If you have any questions, ask your nurse or doctor.          acetaminophen 500 MG tablet Commonly known as: TYLENOL Take 500 mg by mouth daily as needed for moderate pain.   albuterol 108 (90 Base) MCG/ACT inhaler Commonly known as: VENTOLIN HFA Inhale 2 puffs into the lungs every 6 (six) hours as needed for wheezing or shortness of breath. What changed: Another medication with the same name was removed. Continue taking this medication, and follow the directions you see here. Changed by: Caesar Bookman, NP   ALPRAZolam 1 MG tablet Commonly known as: XANAX TAKE 1 TABLET BY MOUTH AT  BEDTIME   amitriptyline 75 MG tablet Commonly known as: ELAVIL TAKE 1 TABLET BY MOUTH AT BEDTIME   Belbuca 300 MCG Film Generic drug: Buprenorphine HCl Place 300 mcg inside cheek every 12 (twelve)  hours.   cyanocobalamin 500 MCG tablet Commonly known as: VITAMIN B12 Take 1 tablet (500 mcg total) by mouth daily.   diphenoxylate-atropine 2.5-0.025 MG tablet Commonly known as: LOMOTIL Take 1 tablet by mouth 3 (three) times daily as needed for diarrhea or loose stools.   ezetimibe 10 MG tablet Commonly known as: Zetia Take 1 tablet (10 mg total) by mouth daily.   fluticasone-salmeterol 250-50 MCG/ACT Aepb Commonly known as: ADVAIR Inhale 1 puff into the lungs in the morning and at bedtime.   loperamide 2 MG capsule Commonly known as: IMODIUM Take 1-2 capsules (2-4 mg total) by mouth 4 (four) times daily as needed for diarrhea or loose stools.   Lubricant Eye Nighttime Oint Place 1 drop into the left eye  at bedtime as needed (dryness).   LUBRICATING EYE DROPS OP Place 1 drop into the right eye in the morning, at noon, and at bedtime.   methocarbamol 500 MG tablet Commonly known as: ROBAXIN Take 1 tablet (500 mg total) by mouth every 6 (six) hours as needed for muscle spasms.   midodrine 10 MG tablet Commonly known as: PROAMATINE Take 1 tablet (10 mg total) by mouth 3 (three) times daily as needed.   multivitamin with minerals Tabs tablet Take 1 tablet by mouth daily.   omeprazole 20 MG tablet Commonly known as: PRILOSEC OTC Take 20 mg by mouth daily.   PRESCRIPTION MEDICATION CPAP- At bedtime   triamcinolone cream 0.1 % Commonly known as: KENALOG Apply 1 Application topically 2 (two) times daily.   Verzenio 100 MG tablet Generic drug: abemaciclib TAKE 1 TABLET BY MOUTH TWICE DAILY   Vitamin D (Ergocalciferol) 1.25 MG (50000 UNIT) Caps capsule Commonly known as: DRISDOL Take 50,000 Units by mouth every 7 (seven) days. Monday   Xiidra 5 % Soln Generic drug: Lifitegrast Place 1 drop into the left eye 2 (two) times daily.        Review of Systems  Constitutional:  Negative for appetite change, chills, fatigue, fever and unexpected weight change.   HENT:  Negative for congestion, dental problem, ear discharge, ear pain, facial swelling, hearing loss, nosebleeds, postnasal drip, rhinorrhea, sinus pressure, sinus pain, sneezing, sore throat, tinnitus and trouble swallowing.   Eyes:  Positive for visual disturbance. Negative for pain, discharge, redness and itching.       Right eye blind   Respiratory:  Negative for cough, chest tightness, shortness of breath and wheezing.   Cardiovascular:  Negative for chest pain, palpitations and leg swelling.  Gastrointestinal:  Negative for abdominal distention, abdominal pain, blood in stool, constipation, diarrhea, nausea and vomiting.  Endocrine: Negative for cold intolerance, heat intolerance, polydipsia, polyphagia and polyuria.  Genitourinary:  Negative for difficulty urinating, dysuria, flank pain, frequency and urgency.  Musculoskeletal:  Negative for arthralgias, back pain, gait problem, joint swelling, myalgias, neck pain and neck stiffness.  Skin:  Negative for color change, pallor, rash and wound.  Neurological:  Negative for dizziness, syncope, speech difficulty, weakness, light-headedness, numbness and headaches.  Hematological:  Does not bruise/bleed easily.  Psychiatric/Behavioral:  Negative for agitation, behavioral problems, confusion, hallucinations, self-injury, sleep disturbance and suicidal ideas. The patient is not nervous/anxious.     Immunization History  Administered Date(s) Administered   Influenza Inj Mdck Quad Pf 04/25/2019   Influenza,inj,Quad PF,6+ Mos 02/24/2020, 04/15/2022   Influenza,inj,quad, With Preservative 04/25/2019   Influenza-Unspecified 05/26/2021   Moderna Sars-Covid-2 Vaccination 06/29/2019, 08/03/2019, 06/24/2020   PFIZER(Purple Top)SARS-COV-2 Vaccination 05/26/2021   Pneumococcal Conjugate-13 05/29/2019   Pneumococcal Polysaccharide-23 07/29/2019   Zoster Recombinat (Shingrix) 02/17/2020, 04/20/2020   Pertinent  Health Maintenance Due  Topic Date  Due   INFLUENZA VACCINE  01/05/2023   PAP SMEAR-Modifier  09/13/2023   MAMMOGRAM  01/05/2024   COLONOSCOPY (Pts 45-20yrs Insurance coverage will need to be confirmed)  08/16/2027      08/08/2022   12:38 PM 08/18/2022   12:00 PM 08/23/2022   12:29 PM 08/30/2022   11:32 AM 09/01/2022   11:41 AM  Fall Risk  Falls in the past year? 0 0 0 0 0  Was there an injury with Fall? 0 0 0 0 0  Fall Risk Category Calculator 0 0 0 0 0  Patient at Risk for Falls Due to No Fall Risks No Fall  Risks No Fall Risks No Fall Risks No Fall Risks  Fall risk Follow up Falls evaluation completed Falls evaluation completed Falls evaluation completed Falls evaluation completed Falls evaluation completed   Functional Status Survey:    Vitals:   09/13/22 0850  BP: 98/60  Pulse: 100  Resp: 16  Temp: (!) 97.5 F (36.4 C)  TempSrc: Temporal  SpO2: 99%  Weight: 197 lb 9.6 oz (89.6 kg)  Height: 5\' 8"  (1.727 m)   Body mass index is 30.04 kg/m. Physical Exam Vitals reviewed.  Constitutional:      General: She is not in acute distress.    Appearance: Normal appearance. She is obese. She is not ill-appearing or diaphoretic.  HENT:     Head: Normocephalic.     Right Ear: Tympanic membrane, ear canal and external ear normal. There is no impacted cerumen.     Left Ear: Tympanic membrane, ear canal and external ear normal. There is impacted cerumen.     Nose: Nose normal. No congestion or rhinorrhea.     Mouth/Throat:     Mouth: Mucous membranes are moist.     Pharynx: Oropharynx is clear. No oropharyngeal exudate or posterior oropharyngeal erythema.  Eyes:     General: No scleral icterus.       Right eye: No discharge.     Conjunctiva/sclera: Conjunctivae normal.     Comments: Right eye prosthesis.Patch on during visit. Left eye chronic watery   Neck:     Vascular: No carotid bruit.  Cardiovascular:     Rate and Rhythm: Normal rate and regular rhythm.     Pulses: Normal pulses.     Heart sounds: Normal  heart sounds. No murmur heard.    No friction rub. No gallop.  Pulmonary:     Effort: Pulmonary effort is normal. No respiratory distress.     Breath sounds: Normal breath sounds. No wheezing, rhonchi or rales.  Chest:     Chest wall: No tenderness.  Abdominal:     General: Bowel sounds are normal. There is no distension.     Palpations: Abdomen is soft. There is no mass.     Tenderness: There is no abdominal tenderness. There is no right CVA tenderness, left CVA tenderness, guarding or rebound.  Musculoskeletal:        General: No swelling or tenderness. Normal range of motion.     Cervical back: Normal range of motion. No rigidity or tenderness.     Right lower leg: No edema.     Left lower leg: No edema.  Lymphadenopathy:     Cervical: No cervical adenopathy.  Skin:    General: Skin is warm and dry.     Coloration: Skin is not pale.     Findings: No bruising, erythema, lesion or rash.  Neurological:     Mental Status: She is alert and oriented to person, place, and time.     Cranial Nerves: No cranial nerve deficit.     Sensory: No sensory deficit.     Motor: No weakness.     Coordination: Coordination normal.     Gait: Gait abnormal.  Psychiatric:        Mood and Affect: Mood normal.        Speech: Speech normal.        Behavior: Behavior normal.        Thought Content: Thought content normal.        Judgment: Judgment normal.     Labs reviewed: Recent Labs  04/24/22 1545 04/25/22 0844 04/26/22 0518 05/06/22 0958 07/26/22 1206 07/27/22 0257 07/28/22 0249 07/29/22 0510 08/08/22 1421 08/19/22 0719  NA 140 143 140   < >  --  136   < > 135 141 142  K 2.0* 3.1* 3.1*   < >  --  3.9   < > 3.8 4.2 3.8  CL 91* 97* 97*   < >  --  99   < > 101 99 105  CO2 37* 37* 35*   < >  --  25   < > 24 30 30   GLUCOSE 118* 100* 104*   < >  --  228*   < > 161* 82 108*  BUN 10 8 8    < >  --  21*   < > 33* 10 10  CREATININE 1.54* 1.27* 1.12*   < >  --  0.98   < > 0.89 1.28* 1.28*   CALCIUM 8.0* 7.7* 7.9*   < >  --  9.4   < > 8.7* 9.5 9.0  MG 1.3* 1.5* 1.8   < > 1.5* 2.0  --   --  1.2*  --   PHOS 1.9* 1.7* 2.6  --   --   --   --   --   --   --    < > = values in this interval not displayed.   Recent Labs    07/27/22 0257 07/28/22 0249 08/08/22 1421 08/19/22 0719  AST 71* 69* 50* 42*  ALT 45* 53* 77* 35  ALKPHOS 182* 173*  --  229*  BILITOT 1.4* 1.0 1.3* 1.0  PROT 7.4 6.1* 7.2 6.9  ALBUMIN 2.7* 2.2*  --  3.4*   Recent Labs    07/23/22 0917 07/24/22 0250 07/28/22 0249 08/08/22 1421 08/19/22 0719  WBC 10.1   < > 11.6* 4.8 4.7  NEUTROABS 7.2  --   --  1,848 1.8  HGB 12.9   < > 10.2* 11.0* 10.8*  HCT 40.7   < > 29.9* 32.7* 32.7*  MCV 113.4*   < > 104.5* 104.8* 107.6*  PLT 189   < > 306 280 216   < > = values in this interval not displayed.   Lab Results  Component Value Date   TSH 0.428 07/23/2022   Lab Results  Component Value Date   HGBA1C 5.3 07/26/2022   Lab Results  Component Value Date   CHOL 219 (H) 06/07/2022   HDL 76 06/07/2022   LDLCALC 112 (H) 06/07/2022   TRIG 194 (H) 06/07/2022   CHOLHDL 2.9 06/07/2022    Significant Diagnostic Results in last 30 days:  No results found.  Assessment/Plan  1. Essential hypertension B/p low soft off B/p medication.on midodrine three times as needed  - Asymptomatic  - Lipid Panel - TSH  2. Gastroesophageal reflux disease without esophagitis Stable  Continue on omeprazole  3. Generalized anxiety disorder Stable -Continue on amitriptyline  4. Mixed hyperlipidemia Previous LDL 112 not at goal -Continue with dietary modification and exercise as tolerated - Lipid Panel  5. Orthostatic hypotension Continue on on midodrine three times as needed   6. Impacted cerumen of left ear Tympanic membrane not visualized on the left ear due to cerumen impaction. - Instill debrox 6.5 otic solution 5 drops into left ear twice daily x 4 days then follow up for ear lavage.May apply cotton ball at  bedtime to prevent drainage to pillow. - carbamide peroxide (DEBROX) 6.5 % OTIC solution;  Place 5 drops into the left ear 2 (two) times daily.  Dispense: 15 mL; Refill: 0  Family/ staff Communication: Reviewed plan of care with patient verbalized understanding  Labs/tests ordered:  - Lipid Panel - TSH  Next Appointment : Return in about 6 months (around 03/15/2023) for medical mangement of chronic issues. one week for left ear lava.   Caesar Bookman, NP

## 2022-09-13 NOTE — Patient Instructions (Addendum)
-   Instill debrox 6.5 otic solution 5 drops into left  ear twice daily x 4 days then follow up for ear lavage.May apply cotton ball at bedtime to prevent drainage to pillow. - Please get Tetanus vaccine at the pharmacy

## 2022-09-14 ENCOUNTER — Ambulatory Visit
Admission: RE | Admit: 2022-09-14 | Discharge: 2022-09-14 | Disposition: A | Discharge status: Home / Self Care | Payer: Medicare Other | Source: Ambulatory Visit | Attending: Hematology | Admitting: Hematology

## 2022-09-14 DIAGNOSIS — Z853 Personal history of malignant neoplasm of breast: Secondary | ICD-10-CM | POA: Diagnosis not present

## 2022-09-14 DIAGNOSIS — C50919 Malignant neoplasm of unspecified site of unspecified female breast: Secondary | ICD-10-CM

## 2022-09-14 DIAGNOSIS — N644 Mastodynia: Secondary | ICD-10-CM | POA: Diagnosis not present

## 2022-09-14 LAB — LIPID PANEL
Cholesterol: 299 mg/dL — ABNORMAL HIGH (ref <200)
HDL: 82 mg/dL (ref >=50)
LDL Cholesterol (Calc): 161 mg/dL (calc) — ABNORMAL HIGH
Non-HDL Cholesterol (Calc): 217 mg/dL (calc) — ABNORMAL HIGH (ref <130)
Total CHOL/HDL Ratio: 3.6 (calc) (ref <5.0)
Triglycerides: 365 mg/dL — ABNORMAL HIGH (ref <150)

## 2022-09-14 LAB — TSH: TSH: 2.74 mIU/L (ref 0.40–4.50)

## 2022-09-19 ENCOUNTER — Other Ambulatory Visit: Payer: Self-pay

## 2022-09-19 ENCOUNTER — Encounter: Payer: Self-pay | Admitting: Family

## 2022-09-19 ENCOUNTER — Other Ambulatory Visit: Payer: Self-pay | Admitting: Family

## 2022-09-19 ENCOUNTER — Ambulatory Visit (INDEPENDENT_AMBULATORY_CARE_PROVIDER_SITE_OTHER): Payer: Medicare Other | Admitting: Family

## 2022-09-19 VITALS — BP 120/62 | HR 63 | Temp 97.3°F | Resp 16 | Ht 68.0 in | Wt 197.6 lb

## 2022-09-19 DIAGNOSIS — H6122 Impacted cerumen, left ear: Secondary | ICD-10-CM | POA: Diagnosis not present

## 2022-09-19 DIAGNOSIS — E782 Mixed hyperlipidemia: Secondary | ICD-10-CM

## 2022-09-19 NOTE — Progress Notes (Signed)
Hyperlipidemia referral has been ordered.

## 2022-09-19 NOTE — Progress Notes (Signed)
Provider: Richarda Blade FNP-C  Brandy [name], Donalee Citrin, NP  Patient Care Team: [name], Donalee Citrin, NP as PCP - General (Family Medicine) Thomasene Ripple, DO as PCP - Cardiology (Cardiology) Marva Panda, NP as Nurse Practitioner  Extended Emergency Contact Information Primary Emergency Contact: [name],Charles Address: [address]          [place], [place] [address] Darden [place] of [place] Home Phone: [phone] Relation: Spouse Secondary Emergency Contact: [name],Michele Address: [address]          [place], [place] [address] D[place] of [place] Home Phone: [phone] Relation: Daughter  Code Status:  Full Code  Goals of care: Advanced Directive information    08/08/2022   12:38 PM  Advanced Directives  Does Patient Have a Medical Advance Directive? No  Would patient like information on creating a medical advance directive? No - Patient declined     Chief Complaint  Patient presents with   ear lavage    HPI:  Pt is a 57 y.o. female seen today for an acute visit for evaluation of left ear lavage.she was here on 09/13/2022  for routine follow up left cerumen impaction was noted. She was advised to instil debrox 6.5 % otic solution 5 drops into left ear twice daily x 4 days then follow up today for ear lavage.states has used debrox sol as prescribed.she denies any ringing in the ear,pain or drainage. Also states has found an apartment to move into that will be on lower levels and has backdoor with screen that accessible with a walker does not have to climb up the steps.    Past Medical History:  Diagnosis Date   Acute pansinusitis 08/02/2017   Anxiety    Arthritis    ASD (atrial septal defect)    s/p closure with Amplatzer device 10/05/04 (Dr. Celso Amy, [facility]) 10/05/04   Cancer    Cataract    Chronic pain    CKD (chronic kidney disease)    Dyspnea    Fatty liver 09/04/2019   GERD (gastroesophageal reflux disease)    Heart murmur    no longer  heard   History of hiatal hernia    Hyperlipidemia    Legally blind in right eye, as defined in [place]    Lumbar herniated disc    Migraines    On home oxygen therapy 06/15/2022   Pt was given O2 on D/C from [facility] 04/2022   OSA on CPAP 06/15/2022   PONV (postoperative nausea and vomiting)    Sciatica    Past Surgical History:  Procedure Laterality Date   ABLATION     BREAST LUMPECTOMY WITH RADIOACTIVE SEED AND SENTINEL LYMPH NODE BIOPSY Right 11/23/2020   Procedure: RIGHT BREAST LUMPECTOMY WITH RADIOACTIVE SEED AND RIGHT AXILLARY SENTINEL LYMPH NODE BIOPSY;  Surgeon: Emelia Loron, MD;  Location: [facility] OR;  Service: General;  Laterality: Right;   BREAST SURGERY Bilateral 2011   Breast Reduction Surgery   BUNIONECTOMY     CARDIAC CATHETERIZATION     10/05/04 [facility]): LM < 25%, otherwise normal coronaries. No pulmonary HTN, Mildly enlarged RV. Secundum ASD s/p closure.   CARDIAC SURGERY     CATARACT EXTRACTION     CLEFT PALATE REPAIR     s/p cleft lip and palate repair   EYE SURGERY Right 2019   right eye removed   LUMBAR LAMINECTOMY/DECOMPRESSION MICRODISCECTOMY Left 05/23/2016   Procedure: LEFT L4-L5 LATERAL RECESS DECOMPRESSION WITH CENTRAL AND RIGHT DECOMPRESSION VIA LEFT SIDE;  Surgeon: Kerrin Champagne,  MD;  Location: [facility] OR;  Service: Orthopedics;  Laterality: Left;   LUMBAR LAMINECTOMY/DECOMPRESSION MICRODISCECTOMY Left 05/23/2016   Procedure: LUMBAR LAMINECTOMY/DECOMPRESSION MICRODISCECTOMY Lumbar five - Sacral One 1 LEVEL;  Surgeon: Kerrin Champagne, MD;  Location: [facility] OR;  Service: Orthopedics;  Laterality: Left;   REDUCTION MAMMAPLASTY  2011   SHOULDER INJECTION Left 05/23/2016   Procedure: SHOULDER INJECTION;  Surgeon: Kerrin Champagne, MD;  Location: [facility] OR;  Service: Orthopedics;  Laterality: Left;  band-aid per pa-c   TRANSTHORACIC ECHOCARDIOGRAM     12/15/05 [facility]): Mild LVH, EF > 55%, grade 1 diastolic dysfunction, Trivial MR/PR/TR.   TUBAL LIGATION      Allergies  Allergen  Reactions   Zithromax [Azithromycin] Shortness Of Breath, Itching and Other (See Comments)    TOTAL BODY ITCHING [EVEN SOLES OF FEET] WHEEZING    Tramadol Itching and Other (See Comments)    Has taken recently without any side effects.   Psyllium Nausea And Vomiting and Other (See Comments)    Metamucil. Sneezing      Outpatient Encounter Medications as of 09/19/2022  Medication Sig   acetaminophen (TYLENOL) 500 MG tablet Take 500 mg by mouth daily as needed for moderate pain.   albuterol (VENTOLIN HFA) 108 (90 Base) MCG/ACT inhaler Inhale 2 puffs into the lungs every 6 (six) hours as needed for wheezing or shortness of breath.   ALPRAZolam (XANAX) 1 MG tablet TAKE 1 TABLET BY MOUTH AT  BEDTIME   amitriptyline (ELAVIL) 75 MG tablet TAKE 1 TABLET BY MOUTH AT BEDTIME   BELBUCA 300 MCG FILM Place 300 mcg inside cheek every 12 (twelve) hours.   carbamide peroxide (DEBROX) 6.5 % OTIC solution Place 5 drops into the left ear 2 (two) times daily.   Carboxymethylcellul-Glycerin (LUBRICATING EYE DROPS OP) Place 1 drop into the right eye in the morning, at noon, and at bedtime.   cyanocobalamin (VITAMIN B12) 500 MCG tablet Take 1 tablet (500 mcg total) by mouth daily.   diphenoxylate-atropine (LOMOTIL) 2.5-0.025 MG tablet Take 1 tablet by mouth 3 (three) times daily as needed for diarrhea or loose stools.   ezetimibe (ZETIA) 10 MG tablet Take 1 tablet (10 mg total) by mouth daily.   fluticasone-salmeterol (ADVAIR) 250-50 MCG/ACT AEPB Inhale 1 puff into the lungs in the morning and at bedtime.   loperamide (IMODIUM) 2 MG capsule Take 1-2 capsules (2-4 mg total) by mouth 4 (four) times daily as needed for diarrhea or loose stools.   methocarbamol (ROBAXIN) 500 MG tablet Take 1 tablet (500 mg total) by mouth every 6 (six) hours as needed for muscle spasms.   midodrine (PROAMATINE) 10 MG tablet Take 1 tablet (10 mg total) by mouth 3 (three) times daily as needed.   Multiple Vitamin (MULTIVITAMIN WITH  MINERALS) TABS tablet Take 1 tablet by mouth daily.   omeprazole (PRILOSEC OTC) 20 MG tablet Take 20 mg by mouth daily.   PRESCRIPTION MEDICATION CPAP- At bedtime   triamcinolone cream (KENALOG) 0.1 % Apply 1 Application topically 2 (two) times daily.   VERZENIO 100 MG tablet TAKE 1 TABLET BY MOUTH TWICE DAILY   Vitamin D, Ergocalciferol, (DRISDOL) 1.25 MG (50000 UNIT) CAPS capsule Take 50,000 Units by mouth every 7 (seven) days. Monday   White Petrolatum-Mineral Oil (LUBRICANT EYE NIGHTTIME) OINT Place 1 drop into the left eye at bedtime as needed (dryness).   XIIDRA 5 % SOLN Place 1 drop into the left eye 2 (two) times daily.   No facility-administered encounter medications on  file as of 09/19/2022.    Review of Systems  Constitutional:  Negative for appetite change, chills, fatigue, fever and unexpected weight change.  HENT:  Negative for congestion, dental problem, ear discharge, ear pain, facial swelling, hearing loss, nosebleeds, postnasal drip, rhinorrhea, sinus pressure, sinus pain, sneezing, sore throat, tinnitus and trouble swallowing.   Eyes:  Negative for pain, discharge, redness, itching and visual disturbance.  Respiratory:  Negative for cough, chest tightness, shortness of breath and wheezing.   Cardiovascular:  Negative for chest pain, palpitations and leg swelling.  Gastrointestinal:  Negative for abdominal distention, abdominal pain, blood in stool, constipation, diarrhea, nausea and vomiting.  Musculoskeletal:  Positive for arthralgias and gait problem. Negative for back pain, joint swelling, myalgias, neck pain and neck stiffness.  Skin:  Negative for color change, pallor, rash and wound.  Neurological:  Negative for dizziness, syncope, light-headedness, numbness and headaches.  Psychiatric/Behavioral:  Negative for agitation, behavioral problems and confusion.     Immunization History  Administered Date(s) Administered   Influenza Inj Mdck Quad Pf 04/25/2019    Influenza,inj,Quad PF,6+ Mos 02/24/2020, 04/15/2022   Influenza,inj,quad, With Preservative 04/25/2019   Influenza-Unspecified 05/26/2021   Moderna Sars-Covid-2 Vaccination 06/29/2019, 08/03/2019, 06/24/2020   PFIZER(Purple Top)SARS-COV-2 Vaccination 05/26/2021   Pneumococcal Conjugate-13 05/29/2019   Pneumococcal Polysaccharide-23 07/29/2019   Zoster Recombinat (Shingrix) 02/17/2020, 04/20/2020   Pertinent  Health Maintenance Due  Topic Date Due   INFLUENZA VACCINE  01/05/2023   PAP SMEAR-Modifier  09/13/2023   MAMMOGRAM  09/13/2024   COLONOSCOPY (Pts 45-7yrs Insurance coverage will need to be confirmed)  08/16/2027      08/08/2022   12:38 PM 08/18/2022   12:00 PM 08/23/2022   12:29 PM 08/30/2022   11:32 AM 09/01/2022   11:41 AM  Fall Risk  Falls in the past year? 0 0 0 0 0  Was there an injury with Fall? 0 0 0 0 0  Fall Risk Category Calculator 0 0 0 0 0  Patient at Risk for Falls Due to No Fall Risks No Fall Risks No Fall Risks No Fall Risks No Fall Risks  Fall risk Follow up Falls evaluation completed Falls evaluation completed Falls evaluation completed Falls evaluation completed Falls evaluation completed   Functional Status Survey:    Vitals:   09/19/22 1327  BP: 120/62  Pulse: 63  Resp: 16  Temp: (!) 97.3 F (36.3 C)  TempSrc: Temporal  SpO2: 97%  Weight: 197 lb 10.1 oz (89.6 kg)  Height: 5\' 8"  (1.727 m)   Body mass index is 30.05 kg/m. Physical Exam Vitals reviewed.  Constitutional:      General: She is not in acute distress.    Appearance: Normal appearance. She is normal weight. She is not ill-appearing or diaphoretic.  HENT:     Head: Normocephalic.     Right Ear: Tympanic membrane, ear canal and external ear normal. There is no impacted cerumen.     Left Ear: Tympanic membrane, ear canal and external ear normal. There is impacted cerumen.     Nose: Nose normal. No congestion or rhinorrhea.     Mouth/Throat:     Mouth: Mucous membranes are moist.      Pharynx: Oropharynx is clear. No oropharyngeal exudate or posterior oropharyngeal erythema.  Eyes:     General: No scleral icterus.       Right eye: No discharge.        Left eye: No discharge.     Extraocular Movements: Extraocular movements intact.  Conjunctiva/sclera: Conjunctivae normal.     Pupils: Pupils are equal, round, and reactive to light.  Neck:     Vascular: No carotid bruit.  Cardiovascular:     Rate and Rhythm: Normal rate and regular rhythm.     Pulses: Normal pulses.     Heart sounds: Normal heart sounds. No murmur heard.    No friction rub. No gallop.  Pulmonary:     Effort: Pulmonary effort is normal. No respiratory distress.     Breath sounds: Normal breath sounds. No wheezing, rhonchi or rales.  Chest:     Chest wall: No tenderness.  Musculoskeletal:     Cervical back: Normal range of motion. No rigidity or tenderness.  Lymphadenopathy:     Cervical: No cervical adenopathy.  Skin:    General: Skin is warm and dry.     Coloration: Skin is not pale.     Findings: No bruising, erythema, lesion or rash.  Neurological:     Mental Status: She is alert and oriented to person, place, and time.     Cranial Nerves: No cranial nerve deficit.     Sensory: No sensory deficit.     Motor: No weakness.     Coordination: Coordination normal.     Gait: Gait abnormal.  Psychiatric:        Mood and Affect: Mood normal.        Speech: Speech normal.        Behavior: Behavior normal.     Labs reviewed: Recent Labs    04/24/22 1545 04/25/22 0844 04/26/22 0518 05/06/22 0958 07/26/22 1206 07/27/22 0257 07/28/22 0249 07/29/22 0510 08/08/22 1421 08/19/22 0719  NA 140 143 140   < >  --  136   < > 135 141 142  K 2.0* 3.1* 3.1*   < >  --  3.9   < > 3.8 4.2 3.8  CL 91* 97* 97*   < >  --  99   < > 101 99 105  CO2 37* 37* 35*   < >  --  25   < > 24 30 30   GLUCOSE 118* 100* 104*   < >  --  228*   < > 161* 82 108*  BUN 10 8 8    < >  --  21*   < > 33* 10 10   CREATININE 1.54* 1.27* 1.12*   < >  --  0.98   < > 0.89 1.28* 1.28*  CALCIUM 8.0* 7.7* 7.9*   < >  --  9.4   < > 8.7* 9.5 9.0  MG 1.3* 1.5* 1.8   < > 1.5* 2.0  --   --  1.2*  --   PHOS 1.9* 1.7* 2.6  --   --   --   --   --   --   --    < > = values in this interval not displayed.   Recent Labs    07/27/22 0257 07/28/22 0249 08/08/22 1421 08/19/22 0719  AST 71* 69* 50* 42*  ALT 45* 53* 77* 35  ALKPHOS 182* 173*  --  229*  BILITOT 1.4* 1.0 1.3* 1.0  PROT 7.4 6.1* 7.2 6.9  ALBUMIN 2.7* 2.2*  --  3.4*   Recent Labs    07/23/22 0917 07/24/22 0250 07/28/22 0249 08/08/22 1421 08/19/22 0719  WBC 10.1   < > 11.6* 4.8 4.7  NEUTROABS 7.2  --   --  1,848 1.8  HGB 12.9   < >  10.2* 11.0* 10.8*  HCT 40.7   < > 29.9* 32.7* 32.7*  MCV 113.4*   < > 104.5* 104.8* 107.6*  PLT 189   < > 306 280 216   < > = values in this interval not displayed.   Lab Results  Component Value Date   TSH 2.74 09/13/2022   Lab Results  Component Value Date   HGBA1C 5.3 07/26/2022   Lab Results  Component Value Date   CHOL 299 (H) 09/13/2022   HDL 82 09/13/2022   LDLCALC 161 (H) 09/13/2022   TRIG 365 (H) 09/13/2022   CHOLHDL 3.6 09/13/2022    Significant Diagnostic Results in last 30 days:  MM 3D DIAGNOSTIC MAMMOGRAM BILATERAL BREAST  Result Date: 09/14/2022 CLINICAL DATA:  RIGHT breast pain. History of RIGHT breast cancer status post lumpectomy in 2022. History of LEFT breast reduction. EXAM: DIGITAL DIAGNOSTIC BILATERAL MAMMOGRAM WITH TOMOSYNTHESIS TECHNIQUE: Bilateral digital diagnostic mammography and breast tomosynthesis was performed. COMPARISON:  Previous exam(s). ACR Breast Density Category b: There are scattered areas of fibroglandular density. FINDINGS: There are stable postsurgical changes of both breasts. There are no new dominant masses, suspicious calcifications or secondary signs of malignancy within either breast. IMPRESSION: No evidence of malignancy within either breast. Stable  postsurgical changes. RECOMMENDATION: Bilateral diagnostic mammogram in 1 year. I have discussed the findings and recommendations with the patient. If applicable, a reminder letter will be sent to the patient regarding the next appointment. BI-RADS CATEGORY  2: Benign. Electronically Signed   By: Bary Richard M.D.   On: 09/14/2022 16:02   Assessment/Plan Impacted cerumen of left ear Afebrile  ear cerumen lavaged with warm water and hydrogen peroxide small amounts of cerumen obtained.Tolerated procedure well.TM clear without any signs of infection.  Family/ staff Communication: Reviewed plan of care with patient verbalized understanding  Labs/tests ordered: None   Next Appointment: Return if symptoms worsen or fail to improve.   Caesar Bookman, NP

## 2022-09-21 ENCOUNTER — Other Ambulatory Visit: Payer: Self-pay

## 2022-09-21 MED ORDER — AMITRIPTYLINE HCL 75 MG PO TABS: 75.0000 mg | ORAL_TABLET | Freq: Every day | ORAL | 3 refills | Status: DC

## 2022-09-22 ENCOUNTER — Other Ambulatory Visit: Payer: Self-pay

## 2022-09-22 ENCOUNTER — Other Ambulatory Visit (INDEPENDENT_AMBULATORY_CARE_PROVIDER_SITE_OTHER): Payer: Medicare Other

## 2022-09-22 ENCOUNTER — Ambulatory Visit (INDEPENDENT_AMBULATORY_CARE_PROVIDER_SITE_OTHER): Payer: Medicare Other | Admitting: Orthopedic Surgery

## 2022-09-22 VITALS — BP 120/75 | HR 90 | Ht 68.0 in | Wt 198.0 lb

## 2022-09-22 DIAGNOSIS — C50919 Malignant neoplasm of unspecified site of unspecified female breast: Secondary | ICD-10-CM

## 2022-09-22 DIAGNOSIS — M4316 Spondylolisthesis, lumbar region: Secondary | ICD-10-CM | POA: Diagnosis not present

## 2022-09-22 NOTE — Progress Notes (Signed)
Orthopedic Spine Surgery Office Note  Assessment: Patient is a 57 y.o. female with acute on chronic low back pain.  Has L2/3 spondylolisthesis, disc height loss at L2/3 and L5/S1, loss of lumbar lordosis that all could be contributing to her pain   Plan: -Patient has tried PT, NSAIDs, Tylenol, steroid pills, lumbar spine injections, pain management  -Discussed that there are several potential etiologies of her pain.  She has loss of lumbar lordosis and disc height loss at L2/3 and L5/S1 -Recommended speaking with her pain management doctor to potentially increase her current medication.  I explained that most back pains do get better with time.  She has had prior flares and they do get better.  If this pain does not return to her baseline within a couple of weeks, then we will order an MRI to evaluate further -Patient should return to office on an as needed basis   Patient expressed understanding of the plan and all questions were answered to the patient's satisfaction.   ___________________________________________________________________________   History:  Patient is a 57 y.o. female who presents today for lumbar spine.  Patient has previously undergone left L5/S1 laminotomy and wrong level left L4/5 laminotomy with Dr. Otelia Sergeant on 05/23/2016.  She has a history of chronic low back pain.  She is currently established with pain management.  She states for the last 2 weeks she has noticed acute worsening of her chronic low back pain.  There is no trauma or injury that preceded the onset of the acute pain.  Pain is felt in her lower lumbar spine.  She has no pain radiating into her lower extremities.  Pain is worse with activity.  Pain is severe in nature.  Has chronic neuropathy.  No new paresthesias or numbness.  Of note, she said that she has had acute flares of pain like this in the past and they usually get better with time.   Weakness: yes, low back weakness. No other weakness Symptoms of  imbalance: denies Paresthesias and numbness: denies Bowel or bladder incontinence: denies Saddle anesthesia: denies  Treatments tried: PT, NSAIDs, Tylenol, steroid pills, lumbar spine injections, pain management  Review of systems: Denies fevers and chills, night sweats, unexplained weight loss, history of cancer. Has had pain that wakes her at night  Past medical history: Depression/anxiety Osteoarthritis Atrial septal defect Breast cancer Lung cancer CKD Chronic pain Migraines GERD OSA HLD  Allergies: azithromycin, tramadol, psyllium  Past surgical history:  Bilateral bunionectomy Cleft palate repair Laser eye surgery Right eye enucleation Cardiac surgery Breast lumpectomy with radioactive seed Tubal ligation Anemia Left L5/S1 hemilaminotomy, left L4/5 hemilaminotomy  Social history: Denies use of nicotine product (smoking, vaping, patches, smokeless) Alcohol use: Denies Denies recreational drug use   Physical Exam:  General: no acute distress, appears stated age Neurologic: alert, answering questions appropriately, following commands Respiratory: unlabored breathing on room air, symmetric chest rise Psychiatric: appropriate affect, normal cadence to speech   MSK (spine):  -Strength exam      Left  Right EHL    5/5  5/5 TA    5/5  5/5 GSC    5/5  5/5 Knee extension  5/5  5/5 Hip flexion   5/5  5/5  -Sensory exam    Sensation intact to light touch in L3-S1 nerve distributions of bilateral lower extremities  -Achilles DTR: 1/4 on the left, 1/4 on the right -Patellar tendon DTR: 1/4 on the left, 1/4 on the right  -Straight leg raise: Negative bilaterally -Femoral  nerve stretch test: Negative bilaterally -Clonus: no beats bilaterally  -Left hip exam: No pain through range of motion, negative Stinchfield, negative FABER -Right hip exam: No pain through range of motion, negative Stinchfield, negative FABER  Imaging: XR of the lumbar spine from  09/22/2022 was independently reviewed and interpreted, showing disc height loss at L2/3 and L5/S1.  Spondylolisthesis at L2/3 and L3/4 that shifts about 1mm between flexion and extension views at both levels. No fracture or dislocation seen. PI of 52, LL of 28.   Patient name: Brandy [name] [facility] Patient MRN: [id number] Date of visit: 09/22/22

## 2022-09-23 ENCOUNTER — Inpatient Hospital Stay (HOSPITAL_BASED_OUTPATIENT_CLINIC_OR_DEPARTMENT_OTHER): Payer: Medicare Other | Admitting: Hematology

## 2022-09-23 ENCOUNTER — Telehealth: Payer: Self-pay | Admitting: Hematology

## 2022-09-23 ENCOUNTER — Inpatient Hospital Stay: Payer: Medicare Other

## 2022-09-23 ENCOUNTER — Encounter: Payer: Self-pay | Admitting: Hematology

## 2022-09-23 ENCOUNTER — Inpatient Hospital Stay: Payer: Medicare Other | Attending: Hematology

## 2022-09-23 VITALS — BP 128/77 | HR 80 | Temp 97.1°F | Resp 16 | Wt 197.0 lb

## 2022-09-23 DIAGNOSIS — Z170 Estrogen receptor positive status [ER+]: Secondary | ICD-10-CM | POA: Diagnosis not present

## 2022-09-23 DIAGNOSIS — Z79899 Other long term (current) drug therapy: Secondary | ICD-10-CM | POA: Insufficient documentation

## 2022-09-23 DIAGNOSIS — C7801 Secondary malignant neoplasm of right lung: Secondary | ICD-10-CM | POA: Insufficient documentation

## 2022-09-23 DIAGNOSIS — Z5111 Encounter for antineoplastic chemotherapy: Secondary | ICD-10-CM | POA: Diagnosis present

## 2022-09-23 DIAGNOSIS — E876 Hypokalemia: Secondary | ICD-10-CM | POA: Diagnosis not present

## 2022-09-23 DIAGNOSIS — C50919 Malignant neoplasm of unspecified site of unspecified female breast: Secondary | ICD-10-CM

## 2022-09-23 DIAGNOSIS — I700 Atherosclerosis of aorta: Secondary | ICD-10-CM | POA: Diagnosis not present

## 2022-09-23 DIAGNOSIS — R7989 Other specified abnormal findings of blood chemistry: Secondary | ICD-10-CM | POA: Insufficient documentation

## 2022-09-23 DIAGNOSIS — C50511 Malignant neoplasm of lower-outer quadrant of right female breast: Secondary | ICD-10-CM | POA: Insufficient documentation

## 2022-09-23 DIAGNOSIS — C7802 Secondary malignant neoplasm of left lung: Secondary | ICD-10-CM | POA: Insufficient documentation

## 2022-09-23 LAB — CMP (CANCER CENTER ONLY)
ALT: 68 U/L — ABNORMAL HIGH (ref 0–44)
AST: 79 U/L — ABNORMAL HIGH (ref 15–41)
Albumin: 3.6 g/dL (ref 3.5–5.0)
Alkaline Phosphatase: 267 U/L — ABNORMAL HIGH (ref 38–126)
Anion gap: 6 (ref 5–15)
BUN: 12 mg/dL (ref 6–20)
CO2: 30 mmol/L (ref 22–32)
Calcium: 9.5 mg/dL (ref 8.9–10.3)
Chloride: 104 mmol/L (ref 98–111)
Creatinine: 1.37 mg/dL — ABNORMAL HIGH (ref 0.44–1.00)
GFR, Estimated: 45 mL/min — ABNORMAL LOW (ref >60)
Glucose, Bld: 96 mg/dL (ref 70–99)
Potassium: 3.9 mmol/L (ref 3.5–5.1)
Sodium: 140 mmol/L (ref 135–145)
Total Bilirubin: 0.8 mg/dL (ref 0.3–1.2)
Total Protein: 7.2 g/dL (ref 6.5–8.1)

## 2022-09-23 LAB — CBC WITH DIFFERENTIAL (CANCER CENTER ONLY)
Abs Immature Granulocytes: 0.01 10*3/uL (ref 0.00–0.07)
Basophils Absolute: 0.1 10*3/uL (ref 0.0–0.1)
Basophils Relative: 2 %
Eosinophils Absolute: 0.1 10*3/uL (ref 0.0–0.5)
Eosinophils Relative: 2 %
HCT: 33.6 % — ABNORMAL LOW (ref 36.0–46.0)
Hemoglobin: 11.6 g/dL — ABNORMAL LOW (ref 12.0–15.0)
Immature Granulocytes: 0 %
Lymphocytes Relative: 55 %
Lymphs Abs: 2.1 10*3/uL (ref 0.7–4.0)
MCH: 35.9 pg — ABNORMAL HIGH (ref 26.0–34.0)
MCHC: 34.5 g/dL (ref 30.0–36.0)
MCV: 104 fL — ABNORMAL HIGH (ref 80.0–100.0)
Monocytes Absolute: 0.3 10*3/uL (ref 0.1–1.0)
Monocytes Relative: 8 %
Neutro Abs: 1.2 10*3/uL — ABNORMAL LOW (ref 1.7–7.7)
Neutrophils Relative %: 33 %
Platelet Count: 208 10*3/uL (ref 150–400)
RBC: 3.23 MIL/uL — ABNORMAL LOW (ref 3.87–5.11)
RDW: 13.7 % (ref 11.5–15.5)
WBC Count: 3.7 10*3/uL — ABNORMAL LOW (ref 4.0–10.5)
nRBC: 0 % (ref 0.0–0.2)

## 2022-09-23 MED ORDER — FULVESTRANT 250 MG/5ML IM SOSY
500.0000 mg | PREFILLED_SYRINGE | Freq: Once | INTRAMUSCULAR | Status: AC
  Administered 2022-09-23: 500 mg via INTRAMUSCULAR
  Filled 2022-09-23: qty 10

## 2022-09-23 MED ORDER — ERYTHROMYCIN 5 MG/GM OP OINT: 1.0000 | TOPICAL_OINTMENT | Freq: Three times a day (TID) | OPHTHALMIC | 1 refills | Status: DC
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
## 2022-09-25 DIAGNOSIS — R0600 Dyspnea, unspecified: Secondary | ICD-10-CM | POA: Diagnosis not present

## 2022-09-26 DIAGNOSIS — L308 Other specified dermatitis: Secondary | ICD-10-CM | POA: Diagnosis not present

## 2022-09-26 DIAGNOSIS — D492 Neoplasm of unspecified behavior of bone, soft tissue, and skin: Secondary | ICD-10-CM | POA: Diagnosis not present

## 2022-09-26 DIAGNOSIS — L2089 Other atopic dermatitis: Secondary | ICD-10-CM | POA: Diagnosis not present

## 2022-09-26 DIAGNOSIS — B353 Tinea pedis: Secondary | ICD-10-CM | POA: Diagnosis not present

## 2022-09-27 ENCOUNTER — Other Ambulatory Visit: Payer: Self-pay | Admitting: Family

## 2022-09-27 ENCOUNTER — Ambulatory Visit (INDEPENDENT_AMBULATORY_CARE_PROVIDER_SITE_OTHER): Payer: Medicare Other | Admitting: Family

## 2022-09-27 ENCOUNTER — Encounter: Payer: Self-pay | Admitting: Family

## 2022-09-27 VITALS — BP 130/86 | HR 85 | Temp 96.8°F | Resp 20 | Ht 68.0 in | Wt 197.2 lb

## 2022-09-27 DIAGNOSIS — Z780 Asymptomatic menopausal state: Secondary | ICD-10-CM | POA: Diagnosis not present

## 2022-09-27 DIAGNOSIS — C50919 Malignant neoplasm of unspecified site of unspecified female breast: Secondary | ICD-10-CM

## 2022-09-27 DIAGNOSIS — Z0000 Encounter for general adult medical examination without abnormal findings: Secondary | ICD-10-CM

## 2022-09-27 DIAGNOSIS — F5101 Primary insomnia: Secondary | ICD-10-CM

## 2022-09-27 NOTE — Patient Instructions (Signed)
[name] , Thank you for taking time to come for your Medicare Wellness Visit. I appreciate your ongoing commitment to your health goals. Please review the following plan we discussed and let me know if I can assist you in the future.   Screening recommendations/referrals: Colonoscopy : Up to date  Mammogram : Up to date  Bone Density : due  Recommended yearly ophthalmology/optometry visit for glaucoma screening and checkup Recommended yearly dental visit for hygiene and checkup  Vaccinations: Influenza vaccine : Up to date  Pneumococcal vaccine : Up to date  Tdap vaccine : Due  Shingles vaccine : Up to date    COVID vaccine : Due   Advanced directives: No   Conditions/risks identified:  advanced age (>65men, >20 women)  Next appointment: 1 year   Preventive Care 2-64 Years, Female Preventive care refers to lifestyle choices and visits with your health care provider that can promote health and wellness. What does preventive care include? A yearly physical exam. This is also called an annual well check. Dental exams once or twice a year. Routine eye exams. Ask your health care provider how often you should have your eyes checked. Personal lifestyle choices, including: Daily care of your teeth and gums. Regular physical activity. Eating a healthy diet. Avoiding tobacco and drug use. Limiting alcohol use. Practicing safe sex. Taking low-dose aspirin daily starting at age 52. Taking vitamin and mineral supplements as recommended by your health care provider. What happens during an annual well check? The services and screenings done by your health care provider during your annual well check will depend on your age, overall health, lifestyle risk factors, and family history of disease. Counseling  Your health care provider may ask you questions about your: Alcohol use. Tobacco use. Drug use. Emotional well-being. Home and relationship well-being. Sexual  activity. Eating habits. Work and work Astr[employer]. Method of birth control. Menstrual cycle. Pregnancy history. Screening  You may have the following tests or measurements: Height, weight, and BMI. Blood pressure. Lipid and cholesterol levels. These may be checked every 5 years, or more frequently if you are over 59 years old. Skin check. Lung cancer screening. You may have this screening every year starting at age 70 if you have a 30-pack-year history of smoking and currently smoke or have quit within the past 15 years. Fecal occult blood test (FOBT) of the stool. You may have this test every year starting at age 5. Flexible sigmoidoscopy or colonoscopy. You may have a sigmoidoscopy every 5 years or a colonoscopy every 10 years starting at age 1. Hepatitis C blood test. Hepatitis B blood test. Sexually transmitted disease (STD) testing. Diabetes screening. This is done by checking your blood sugar (glucose) after you have not eaten for a while (fasting). You may have this done every 1-3 years. Mammogram. This may be done every 1-2 years. Talk to your health care provider about when you should start having regular mammograms. This may depend on whether you have a family history of breast cancer. BRCA-related cancer screening. This may be done if you have a family history of breast, ovarian, tubal, or peritoneal cancers. Pelvic exam and Pap test. This may be done every 3 years starting at age 65. Starting at age 11, this may be done every 5 years if you have a Pap test in combination with an HPV test. Bone density scan. This is done to screen for osteoporosis. You may have this scan if you are at high risk for osteoporosis.  Discuss your test results, treatment options, and if necessary, the need for more tests with your health care provider. Vaccines  Your health care provider may recommend certain vaccines, such as: Influenza vaccine. This is recommended every year. Tetanus, diphtheria,  and acellular pertussis (Tdap, Td) vaccine. You may need a Td booster every 10 years. Zoster vaccine. You may need this after age 38. Pneumococcal 13-valent conjugate (PCV13) vaccine. You may need this if you have certain conditions and were not previously vaccinated. Pneumococcal polysaccharide (PPSV23) vaccine. You may need one or two doses if you smoke cigarettes or if you have certain conditions. Talk to your health care provider about which screenings and vaccines you need and how often you need them. This information is not intended to replace advice given to you by your health care provider. Make sure you discuss any questions you have with your health care provider. Document Released: 06/19/2015 Document Revised: 02/10/2016 Document Reviewed: 03/24/2015 Elsevier Interactive Patient Education  2017 [employer].    Fall Prevention in the Home Falls can cause injuries. They can happen to people of all ages. There are many things you can do to make your home safe and to help prevent falls. What can I do on the outside of my home? Regularly fix the edges of walkways and driveways and fix any cracks. Remove anything that might make you trip as you walk through a door, such as a raised step or threshold. Trim any bushes or trees on the path to your home. Use bright outdoor lighting. Clear any walking paths of anything that might make someone trip, such as rocks or tools. Regularly check to see if handrails are loose or broken. Make sure that both sides of any steps have handrails. Any raised decks and porches should have guardrails on the edges. Have any leaves, snow, or ice cleared regularly. Use sand or salt on walking paths during winter. Clean up any spills in your garage right away. This includes oil or grease spills. What can I do in the bathroom? Use night lights. Install grab bars by the toilet and in the tub and shower. Do not use towel bars as grab bars. Use non-skid mats or  decals in the tub or shower. If you need to sit down in the shower, use a plastic, non-slip stool. Keep the floor dry. Clean up any water that spills on the floor as soon as it happens. Remove soap buildup in the tub or shower regularly. Attach bath mats securely with double-sided non-slip rug tape. Do not have throw rugs and other things on the floor that can make you trip. What can I do in the bedroom? Use night lights. Make sure that you have a light by your bed that is easy to reach. Do not use any sheets or blankets that are too big for your bed. They should not hang down onto the floor. Have a firm chair that has side arms. You can use this for support while you get dressed. Do not have throw rugs and other things on the floor that can make you trip. What can I do in the kitchen? Clean up any spills right away. Avoid walking on wet floors. Keep items that you use a lot in easy-to-reach places. If you need to reach something above you, use a strong step stool that has a grab bar. Keep electrical cords out of the way. Do not use floor polish or wax that makes floors slippery. If you must use wax,  use non-skid floor wax. Do not have throw rugs and other things on the floor that can make you trip. What can I do with my stairs? Do not leave any items on the stairs. Make sure that there are handrails on both sides of the stairs and use them. Fix handrails that are broken or loose. Make sure that handrails are as long as the stairways. Check any carpeting to make sure that it is firmly attached to the stairs. Fix any carpet that is loose or worn. Avoid having throw rugs at the top or bottom of the stairs. If you do have throw rugs, attach them to the floor with carpet tape. Make sure that you have a light switch at the top of the stairs and the bottom of the stairs. If you do not have them, ask someone to add them for you. What else can I do to help prevent falls? Wear shoes that: Do not  have high heels. Have rubber bottoms. Are comfortable and fit you well. Are closed at the toe. Do not wear sandals. If you use a stepladder: Make sure that it is fully opened. Do not climb a closed stepladder. Make sure that both sides of the stepladder are locked into place. Ask someone to hold it for you, if possible. Clearly mark and make sure that you can see: Any grab bars or handrails. First and last steps. Where the edge of each step is. Use tools that help you move around (mobility aids) if they are needed. These include: Canes. Walkers. Scooters. Crutches. Turn on the lights when you go into a dark area. Replace any light bulbs as soon as they burn out. Set up your furniture so you have a clear path. Avoid moving your furniture around. If any of your floors are uneven, fix them. If there are any pets around you, be aware of where they are. Review your medicines with your doctor. Some medicines can make you feel dizzy. This can increase your chance of falling. Ask your doctor what other things that you can do to help prevent falls. This information is not intended to replace advice given to you by your health care provider. Make sure you discuss any questions you have with your health care provider. Document Released: 03/19/2009 Document Revised: 10/29/2015 Document Reviewed: 06/27/2014 Elsevier Interactive Patient Education  2017 [employer].

## 2022-09-27 NOTE — Progress Notes (Signed)
Subjective:   Brandy [name] is a 57 y.o. female who presents for Medicare Annual (Subsequent) preventive examination.  Review of Systems     Cardiac Risk Factors include: advanced age (>60men, >30 women)     Objective:    Today's Vitals   09/27/22 1058 09/27/22 1134 09/27/22 1157  BP: 130/86    Pulse: 85    Resp: 20    Temp: (!) 96.8 F (36 C)    SpO2: 97%    Weight: 197 lb 3.2 oz (89.4 kg)    Height: 5\' 8"  (1.727 m)    PainSc:  0-No pain 8    Body mass index is 29.98 kg/m.     09/27/2022   10:59 AM 08/08/2022   12:38 PM 07/27/2022    7:42 PM 07/22/2022    8:47 PM 06/27/2022    9:42 AM 06/15/2022    9:49 AM 06/01/2022    8:05 AM  Advanced Directives  Does Patient Have a Medical Advance Directive? Yes No  No  No No  Does patient want to make changes to medical advance directive? Yes (Inpatient - patient defers changing a medical advance directive at this time - Information given)        Would patient like information on creating a medical advance directive?  No - Patient declined Yes (Inpatient - patient defers creating a medical advance directive at this time - Information given)  Yes (MAU/Ambulatory/Procedural Areas - Information given) No - Patient declined     Current Medications (verified) Outpatient Encounter Medications as of 09/27/2022  Medication Sig   acetaminophen (TYLENOL) 500 MG tablet Take 500 mg by mouth daily as needed for moderate pain.   albuterol (VENTOLIN HFA) 108 (90 Base) MCG/ACT inhaler Inhale 2 puffs into the lungs every 6 (six) hours as needed for wheezing or shortness of breath.   ALPRAZolam (XANAX) 1 MG tablet TAKE 1 TABLET BY MOUTH AT  BEDTIME   amitriptyline (ELAVIL) 75 MG tablet Take 1 tablet (75 mg total) by mouth at bedtime.   BELBUCA 300 MCG FILM Place 300 mcg inside cheek every 12 (twelve) hours.   carbamide peroxide (DEBROX) 6.5 % OTIC solution Place 5 drops into the left ear 2 (two) times daily.    Carboxymethylcellul-Glycerin (LUBRICATING EYE DROPS OP) Place 1 drop into the right eye in the morning, at noon, and at bedtime.   cyanocobalamin (VITAMIN B12) 500 MCG tablet Take 1 tablet (500 mcg total) by mouth daily.   diphenoxylate-atropine (LOMOTIL) 2.5-0.025 MG tablet Take 1 tablet by mouth 3 (three) times daily as needed for diarrhea or loose stools.   erythromycin ophthalmic ointment Place 1 Application into the right eye 3 (three) times daily.   ezetimibe (ZETIA) 10 MG tablet Take 1 tablet (10 mg total) by mouth daily.   fluticasone-salmeterol (ADVAIR) 250-50 MCG/ACT AEPB Inhale 1 puff into the lungs in the morning and at bedtime.   loperamide (IMODIUM) 2 MG capsule Take 1-2 capsules (2-4 mg total) by mouth 4 (four) times daily as needed for diarrhea or loose stools.   methocarbamol (ROBAXIN) 500 MG tablet Take 1 tablet (500 mg total) by mouth every 6 (six) hours as needed for muscle spasms.   midodrine (PROAMATINE) 10 MG tablet Take 1 tablet (10 mg total) by mouth 3 (three) times daily as needed.   Multiple Vitamin (MULTIVITAMIN WITH MINERALS) TABS tablet Take 1 tablet by mouth daily.   omeprazole (PRILOSEC OTC) 20 MG tablet Take 20 mg by mouth daily.  PRESCRIPTION MEDICATION CPAP- At bedtime   triamcinolone cream (KENALOG) 0.1 % Apply 1 Application topically 2 (two) times daily.   VERZENIO 100 MG tablet TAKE 1 TABLET BY MOUTH TWICE DAILY   Vitamin D, Ergocalciferol, (DRISDOL) 1.25 MG (50000 UNIT) CAPS capsule Take 50,000 Units by mouth every 7 (seven) days. Monday   White Petrolatum-Mineral Oil (LUBRICANT EYE NIGHTTIME) OINT Place 1 drop into the left eye at bedtime as needed (dryness).   XIIDRA 5 % SOLN Place 1 drop into the left eye 2 (two) times daily.   No facility-administered encounter medications on file as of 09/27/2022.    Allergies (verified) Zithromax [azithromycin], Tramadol, and Psyllium   History: Past Medical History:  Diagnosis Date   Acute pansinusitis  08/02/2017   Anxiety    Arthritis    ASD (atrial septal defect)    s/p closure with Amplatzer device 10/05/04 (Dr. Celso Amy, [facility]) 10/05/04   Cancer    Cataract    Chronic pain    CKD (chronic kidney disease)    Dyspnea    Fatty liver 09/04/2019   GERD (gastroesophageal reflux disease)    Heart murmur    no longer heard   History of hiatal hernia    Hyperlipidemia    Legally blind in right eye, as defined in [place]    Lumbar herniated disc    Migraines    On home oxygen therapy 06/15/2022   Pt was given O2 on D/C from [facility] 04/2022   OSA on CPAP 06/15/2022   PONV (postoperative nausea and vomiting)    Sciatica    Past Surgical History:  Procedure Laterality Date   ABLATION     BREAST LUMPECTOMY WITH RADIOACTIVE SEED AND SENTINEL LYMPH NODE BIOPSY Right 11/23/2020   Procedure: RIGHT BREAST LUMPECTOMY WITH RADIOACTIVE SEED AND RIGHT AXILLARY SENTINEL LYMPH NODE BIOPSY;  Surgeon: Emelia Loron, MD;  Location: [facility] OR;  Service: General;  Laterality: Right;   BREAST SURGERY Bilateral 2011   Breast Reduction Surgery   BUNIONECTOMY     CARDIAC CATHETERIZATION     10/05/04 [facility]): LM < 25%, otherwise normal coronaries. No pulmonary HTN, Mildly enlarged RV. Secundum ASD s/p closure.   CARDIAC SURGERY     CATARACT EXTRACTION     CLEFT PALATE REPAIR     s/p cleft lip and palate repair   EYE SURGERY Right 2019   right eye removed   LUMBAR LAMINECTOMY/DECOMPRESSION MICRODISCECTOMY Left 05/23/2016   Procedure: LEFT L4-L5 LATERAL RECESS DECOMPRESSION WITH CENTRAL AND RIGHT DECOMPRESSION VIA LEFT SIDE;  Surgeon: Kerrin Champagne, MD;  Location: [facility] OR;  Service: Orthopedics;  Laterality: Left;   LUMBAR LAMINECTOMY/DECOMPRESSION MICRODISCECTOMY Left 05/23/2016   Procedure: LUMBAR LAMINECTOMY/DECOMPRESSION MICRODISCECTOMY Lumbar five - Sacral One 1 LEVEL;  Surgeon: Kerrin Champagne, MD;  Location: [facility] OR;  Service: Orthopedics;  Laterality: Left;   REDUCTION MAMMAPLASTY  2011   SHOULDER  INJECTION Left 05/23/2016   Procedure: SHOULDER INJECTION;  Surgeon: Kerrin Champagne, MD;  Location: [facility] OR;  Service: Orthopedics;  Laterality: Left;  band-aid per pa-c   TRANSTHORACIC ECHOCARDIOGRAM     12/15/05 [facility]): Mild LVH, EF > 55%, grade 1 diastolic dysfunction, Trivial MR/PR/TR.   TUBAL LIGATION     Family History  Problem Relation Age of Onset   Arthritis Mother    Hypertension Mother    Diabetes Mother    High blood pressure Mother    Cancer Father        Lung   Colon cancer  Neg Hx    Colon polyps Neg Hx    Esophageal cancer Neg Hx    Rectal cancer Neg Hx    Stomach cancer Neg Hx    Social History   Socioeconomic History   Marital status: Married    Spouse name: Philippa Chester   Number of children: 3   Years of education: 14   Highest education level: Associate degree: occupational, Scientist, product/process development, or vocational program  Occupational History   Not on file  Tobacco Use   Smoking status: Former    Packs/day: 0.25    Years: 25.00    Additional pack years: 0.00    Total pack years: 6.25    Types: Cigarettes    Quit date: 02/04/2018    Years since quitting: 4.6   Smokeless tobacco: Never  Vaping Use   Vaping Use: Never used  Substance and Sexual Activity   Alcohol use: No   Drug use: No   Sexual activity: Not Currently  Other Topics Concern   Not on file  Social History Narrative   Tobacco use, amount per day now: None.   Past tobacco use, amount per day: 1/4   How many years did you use tobacco: Intermittent x 20 years   Alcohol use (drinks per week): N/A   Diet: Plant Base   Do you drink/eat things with caffeine: Coffee, Tea, Soda.   Marital status:    Married                              What year were you married? 1999   Do you live in a house, apartment, assisted living, condo, trailer, etc.? House    Is it one or more stories? 2   How many persons live in your home? 6 adults, 5 children.   Do you have pets in your home?( please list) 1 Terrier    Highest Level of education completed? AAS   Current or past profession: LPN   Do you exercise?   A little                               Type and how often? Walk, stretches.    Do you have a living will? No   Do you have a DNR form?   No                                If not, do you want to discuss one?   Do you have signed POA/HPOA forms? No                       If so, please bring to you appointment      Do you have any difficulty bathing or dressing yourself? No   Do you have any difficulty preparing food or eating? No   Do you have any difficulty managing your medications? No   Do you have any difficulty managing your finances? No   Do you have any difficulty affording your medications? No.   Social Determinants of Health   Financial Resource Strain: Medium Risk (09/12/2022)   Overall Financial Resource Strain (CARDIA)    Difficulty of Paying Living Expenses: Somewhat hard  Food Insecurity: Food Insecurity Present (09/12/2022)   Hunger Vital Sign    Worried About Running Out of  Food in the Last Year: Sometimes true    Ran Out of Food in the Last Year: Sometimes true  Transportation Needs: No Transportation Needs (09/12/2022)   PRAPARE - Administrator, Civil Service (Medical): No    Lack of Transportation (Non-Medical): No  Recent Concern: Transportation Needs - Unmet Transportation Needs (07/27/2022)   PRAPARE - Transportation    Lack of Transportation (Medical): Yes    Lack of Transportation (Non-Medical): Yes  Physical Activity: Insufficiently Active (09/12/2022)   Exercise Vital Sign    Days of Exercise per Week: 3 days    Minutes of Exercise per Session: 40 min  Stress: Stress Concern Present (09/12/2022)   [employer] of Occupational Health - Occupational Stress Questionnaire    Feeling of Stress : To some extent  Social Connections: Moderately Integrated (09/12/2022)   Social Connection and Isolation Panel [NHANES]    Frequency of Communication with Friends and  Family: Once a week    Frequency of Social Gatherings with Friends and Family: Once a week    Attends Religious Services: 1 to 4 times per year    Active Member of [employer] or Organizations: Yes    Attends Engineer, structural: More than 4 times per year    Marital Status: Married    Tobacco Counseling Counseling given: Not Answered   Clinical Intake:  Pre-visit preparation completed: No  Pain : No/denies pain Pain Score: 8  (today it's a zero) Pain Location: Back Pain Radiating Towards: hips and legs Pain Descriptors / Indicators: Burning, Stabbing Pain Onset: More than a month ago (more than 20 yrs) Pain Frequency: Intermittent Pain Relieving Factors: [place] Effect of Pain on Daily Activities: No  Pain Relieving Factors: [place]  BMI - recorded: 29.98 Nutritional Status: BMI 25 -29 Overweight Nutritional Risks: None Diabetes: No  How often do you need to have someone help you when you read instructions, pamphlets, or other written materials from your doctor or pharmacy?: 4 - Often What is the last grade level you completed in school?: Associate degree  Diabetic?No   Interpreter Needed?: No  Information entered by :: Porsha McClurkin,CMA   Activities of Daily Living    09/27/2022   11:40 AM 09/27/2022   11:05 AM  In your present state of health, do you have any difficulty performing the following activities:  Hearing? 0 1  Vision? 0 1  Difficulty concentrating or making decisions? 0 0  Walking or climbing stairs? 1 1  Comment uses a walker at times   Dressing or bathing? 0 0  Doing errands, shopping? 1 0  Comment uses [employer] transportation   Preparing Food and eating ? N N  Using the Toilet? N N  In the past six months, have you accidently leaked urine? N N  Do you have problems with loss of bowel control? N N  Managing your Medications? N N  Managing your Finances? N N  Housekeeping or managing your Housekeeping? N N    Patient Care  Team: Amiah Frohlich, Donalee Citrin, NP as PCP - General (Family Medicine) Thomasene Ripple, DO as PCP - Cardiology (Cardiology) Marva Panda, NP as Nurse Practitioner  Indicate any recent Medical Services you may have received from other than [facility] providers in the past year (date may be approximate).     Assessment:   This is a routine wellness examination for Santiana.  Hearing/Vision screen Hearing Screening - Comments:: Some hearing loss in bilateral ears Vision Screening - Comments:: Vision concerns, does not  wear glasses anymore  Dietary issues and exercise activities discussed: Current Exercise Habits: The patient does not participate in regular exercise at present, Exercise limited by: None identified   Goals Addressed             This Visit's Progress    Patient Stated       - decrease amount of salt in diet      Patient Stated       Eating healthy three times a day      Weight (lb) < 200 lb (90.7 kg)   197 lb 3.2 oz (89.4 kg)      Depression Screen    09/27/2022   11:01 AM 09/06/2022    4:49 PM 06/15/2022    9:52 AM 06/01/2022    8:05 AM 07/19/2021   10:58 AM 12/31/2019    8:12 AM  PHQ 2/9 Scores  PHQ - 2 Score 0 0 0 0 0 0  PHQ- 9 Score  1 2       Fall Risk    09/27/2022   10:59 AM 09/01/2022   11:41 AM 08/30/2022   11:32 AM 08/23/2022   12:29 PM 08/18/2022   12:00 PM  Fall Risk   Falls in the past year? 0 0 0 0 0  Number falls in past yr: 0 0 0 0 0  Injury with Fall? 0 0 0 0 0  Risk for fall due to : No Fall Risks No Fall Risks No Fall Risks No Fall Risks No Fall Risks  Follow up Falls evaluation completed Falls evaluation completed Falls evaluation completed Falls evaluation completed Falls evaluation completed    FALL RISK PREVENTION PERTAINING TO THE HOME:  Any stairs in or around the home? Yes  If so, are there any without handrails? No  Home free of loose throw rugs in walkways, pet beds, electrical cords, etc? No  Adequate lighting in your home to reduce  risk of falls? No   ASSISTIVE DEVICES UTILIZED TO PREVENT FALLS:  Life alert? No  Use of a cane, walker or w/c? Yes  Grab bars in the bathroom? Yes  Shower chair or bench in shower? No  Elevated toilet seat or a handicapped toilet? Yes   TIMED UP AND GO:  Was the test performed? Yes .  Length of time to ambulate 10 feet: 12 sec.   Gait slow and steady with assistive device  Cognitive Function:    09/27/2022   11:09 AM  MMSE - Mini Mental State Exam  Orientation to time 5  Orientation to Place 5  Registration 3  Attention/ Calculation 5  Recall 3  Language- name 2 objects 2  Language- repeat 1  Language- follow 3 step command 3  Language- read & follow direction 1  Write a sentence 1  Copy design 1  Total score 30        Immunizations Immunization History  Administered Date(s) Administered   Influenza Inj Mdck Quad Pf 04/25/2019   Influenza,inj,Quad PF,6+ Mos 02/24/2020, 04/15/2022   Influenza,inj,quad, With Preservative 04/25/2019   Influenza-Unspecified 05/26/2021   Moderna Sars-Covid-2 Vaccination 06/29/2019, 08/03/2019, 06/24/2020   PFIZER(Purple Top)SARS-COV-2 Vaccination 05/26/2021   Pneumococcal Conjugate-13 05/29/2019   Pneumococcal Polysaccharide-23 07/29/2019   Zoster Recombinat (Shingrix) 02/17/2020, 04/20/2020    TDAP status: Due, Education has been provided regarding the importance of this vaccine. Advised may receive this vaccine at local pharmacy or Health Dept. Aware to provide a copy of the vaccination record if obtained from local pharmacy  or Health Dept. Verbalized acceptance and understanding.  Flu Vaccine status: Up to date  Pneumococcal vaccine status: Up to date  Covid-19 vaccine status: Information provided on how to obtain vaccines.   Qualifies for Shingles Vaccine? Yes   Zostavax completed Yes   Shingrix Completed?: Yes  Screening Tests Health Maintenance  Topic Date Due   DTaP/Tdap/Td (1 - Tdap) Never done   COVID-19 Vaccine  (5 - 2023-24 season) 09/29/2022 (Originally 02/04/2022)   INFLUENZA VACCINE  01/05/2023   PAP SMEAR-Modifier  09/13/2023   Medicare Annual Wellness (AWV)  09/27/2023   MAMMOGRAM  09/13/2024   COLONOSCOPY (Pts 45-44yrs Insurance coverage will need to be confirmed)  08/16/2027   Hepatitis C Screening  Completed   HIV Screening  Completed   Zoster Vaccines- Shingrix  Completed   HPV VACCINES  Aged Out    Health Maintenance  Health Maintenance Due  Topic Date Due   DTaP/Tdap/Td (1 - Tdap) Never done    Colorectal cancer screening: Type of screening: Colonoscopy. Completed 08/15/2017. Repeat every 10 years  Mammogram status: Completed 09/14/2022. Repeat every year  Bone Density status: Ordered 09/27/2022. Pt provided with contact info and advised to call to schedule appt.  Lung Cancer Screening: (Low Dose CT Chest recommended if Age 70-80 years, 30 pack-year currently smoking OR have quit w/in 15years.) does not qualify.   Lung Cancer Screening Referral: NO   Additional Screening:  Hepatitis C Screening: does qualify; Completed Yes   Vision Screening: Recommended annual ophthalmology exams for early detection of glaucoma and other disorders of the eye. Is the patient up to date with their annual eye exam?  Yes  Who is the provider or what is the name of the office in which the patient attends annual eye exams? [facility]  If pt is not established with a provider, would they like to be referred to a provider to establish care? No .   Dental Screening: Recommended annual dental exams for proper oral hygiene  Community Resource Referral / Chronic Care Management: CRR required this visit?  No   CCM required this visit?  No      Plan:     I have personally reviewed and noted the following in the patient's chart:   Medical and social history Use of alcohol, tobacco or illicit drugs  Current medications and supplements including opioid prescriptions. Patient is currently  taking opioid prescriptions. Information provided to patient regarding non-opioid alternatives. Patient advised to discuss non-opioid treatment plan with their provider. Functional ability and status Nutritional status Physical activity Advanced directives List of other physicians Hospitalizations, surgeries, and ER visits in previous 12 months Vitals Screenings to include cognitive, depression, and falls Referrals and appointments  In addition, I have reviewed and discussed with patient certain preventive protocols, quality metrics, and best practice recommendations. A written personalized care plan for preventive services as well as general preventive health recommendations were provided to patient.     Caesar Bookman, NP   09/27/2022   Nurse Notes: Advised to get COVID-19 vaccine and Tdap vaccine at the pharmacy

## 2022-09-28 NOTE — Telephone Encounter (Signed)
Patient has request refill on medication Xanax . Patient medication last refilled 08/31/2022. Patient has Non Opioid Contract signed 06/09/2021. Patient has upcoming appointment 03/15/2023. UPDATE CONTRACT added to patient appointment notes.

## 2022-09-29 ENCOUNTER — Encounter: Payer: Self-pay | Admitting: Hematology

## 2022-09-30 ENCOUNTER — Encounter: Payer: Self-pay | Admitting: Hematology

## 2022-10-01 DIAGNOSIS — R0600 Dyspnea, unspecified: Secondary | ICD-10-CM | POA: Diagnosis not present

## 2022-10-04 ENCOUNTER — Other Ambulatory Visit: Payer: Self-pay

## 2022-10-04 MED ORDER — EZETIMIBE 10 MG PO TABS: 10.0000 mg | ORAL_TABLET | Freq: Every day | ORAL | 1 refills | Status: DC

## 2022-10-06 ENCOUNTER — Encounter: Payer: Self-pay | Admitting: Student in an Organized Health Care Education/Training Program

## 2022-10-06 ENCOUNTER — Ambulatory Visit
Payer: Medicare Other | Attending: Student in an Organized Health Care Education/Training Program | Admitting: Student in an Organized Health Care Education/Training Program

## 2022-10-06 VITALS — BP 134/75 | HR 97 | Temp 97.3°F | Resp 16 | Ht 67.0 in | Wt 193.0 lb

## 2022-10-06 DIAGNOSIS — G894 Chronic pain syndrome: Secondary | ICD-10-CM | POA: Diagnosis not present

## 2022-10-06 DIAGNOSIS — M4310 Spondylolisthesis, site unspecified: Secondary | ICD-10-CM

## 2022-10-06 DIAGNOSIS — M47817 Spondylosis without myelopathy or radiculopathy, lumbosacral region: Secondary | ICD-10-CM | POA: Diagnosis not present

## 2022-10-06 DIAGNOSIS — M4316 Spondylolisthesis, lumbar region: Secondary | ICD-10-CM

## 2022-10-06 DIAGNOSIS — M961 Postlaminectomy syndrome, not elsewhere classified: Secondary | ICD-10-CM | POA: Diagnosis not present

## 2022-10-06 DIAGNOSIS — M47816 Spondylosis without myelopathy or radiculopathy, lumbar region: Secondary | ICD-10-CM | POA: Insufficient documentation

## 2022-10-06 MED ORDER — BELBUCA 300 MCG BU FILM
300.0000 ug | ORAL_FILM | Freq: Two times a day (BID) | BUCCAL | 2 refills | Status: DC
Start: 2022-10-06 — End: 2023-01-10

## 2022-10-06 NOTE — Progress Notes (Deleted)
NEUROLOGY FOLLOW UP OFFICE NOTE  Brandy [name] [facility] [id number]  Assessment/Plan:   Migraine without aura, without status migrainosus, intractable Probable essential tremor.  Not appreciated on exam today.  Does not exhibit findings on exam consistent with Parkinson's disease Chronic back pain - treated by other provider Orthostatic hypotension - treated by cardiology   Migraine prevention:  Amitriptyline 75mg  at bedtime (prescribed by Dr. Otelia [name]) Migraine rescue:  ***.  Zofran 8mg  for nausea Limit use of pain relievers to no more than 2 days out of week to prevent risk of rebound or medication-overuse headache. Keep headache diary Follow up ***       Subjective:  Brandy [name] is a 57 year old female with ASD s/p closure, arthritis,right eye prosthesis and breast cancer who follows up for migraine.   UPDATE: Provided sample of Nurtec.  *** Intensity:  severe Duration:  3 days Frequency:  no recent typical headaches   She has been experiencing newer headaches 4 months ago.  Dull pain across back of head and down the neck bilaterally worse on the left.  She gets it about 1 to 2 times a month.  Lasts 20 to 30 minutes.     Current NSAIDS/analgesics:  acetaminophen Current triptans:  none Current ergotamine:  none Current anti-emetic:  Zofran 8mg  Current muscle relaxants:  methocarbamol  Current Antihypertensive medications:  none Current Antidepressant medications:  amitriptyline 75mg  QHS Current Anticonvulsant medications:  none Current anti-CGRP:  none Current Vitamins/Herbal/Supplements:  magnesium oxide 400mg  BID, B12 Current Antihistamines/Decongestants:  none Other therapy:  none Other medications:  abemaciclib, alprazolam, albuterol, midodrine 10mg  TID, Florinef   Caffeine:  1 small cup of coffee daily.   Diet:  2 cups of water daily.  Skips meals.  Rarely drinks clear soft drinks Exercise:  trying to Depression:  no; Anxiety:  yes Other  pain:  Chronic low back pain with sciatica followed by orthopedics.  Sees Brandy [name] of pain management which helps.   Sleep hygiene:  poor since changed from amitriptyline to duloxetine   HISTORY:  Reports frequent headaches since she was in third grade.  The are severe bitemporal and retro-orbital headaches.  They last 3-5 days.  Occurs at least twice a month.  It used to occur more frequently.  Associated nausea, vomiting, photophobia, phonophobia, osmophobia.  Movement aggravates it.  Vomiting helps, sometimes projectile vomiting.  Recently changed by her spine specialist, Dr. Otelia [name], from amitriptyline to duloxetine but feels that the amitriptyline helped better for migraines, nerve pain and insomnia.     She is followed by cardiology for orthostatic hypotension.  Treated with midodrine.  She was initially experiencing shortness of breath and chest discomfort.  Coronary CTA and echocardiogram were unremarkable.   She has frequent falls due to orthostasis or sometimes legs may just give out.     History of chronic neck pain and low back pain with sciatica.  Has been followed by orthopedics.  Underwent workup in April.  MRI of brain without contrast was unremarkable.  MRI of cervical spine showed diffuse cervical disc degeneration and advanced facet arthrosis with mild spinal stenosis at C3-4 and C4-5 and moderate right neural foraminal stenosis at C4-5.  MRI of thoracic spine showed mild disc and moderate facet degeneration without spinal or high-grade neural foraminal stenosis.  MRI of lumbar spine showed post-op laminectomy left L4-5 and L5-S1 without recurrent disc protrusion, subarticular stenosis left L4-5 and bilateral L5-S1 and multilevel face degeneration with 7 x 8 mm synovial  cyst at L1-2 projecting into posterior epidural space in the midline but not causing significant stenosis.     Notices a tremor, let worse than right.  Does not happen all of the time.  Noticeable particularly with use  such as holding an object..  No family history.     Past NSAIDS/analgesics:  ibuprofen, naproxen, tramadol Past abortive triptans:  sumatriptan tab/NS, rizatriptan Past abortive ergotamine:  none Past muscle relaxants:  baclofen, cylcobenzaprine Past anti-emetic:  none Past antihypertensive medications:  none Past antidepressant medications:  duloxetine, sertraline Past anticonvulsant medications:  topiramate, Depakote, gabapentin Past anti-CGRP:  Brandy [name] Past vitamins/Herbal/Supplements:  none Past antihistamines/decongestants:  meclizine Other past therapies:  none     Family history of headache:  mother (migraines), maternal grandmother (migraines), son (migraines), daughter (migraines), maternal aunt (migraines)  PAST MEDICAL HISTORY: Past Medical History:  Diagnosis Date   Acute pansinusitis 08/02/2017   Anxiety    Arthritis    ASD (atrial septal defect)    s/p closure with Amplatzer device 10/05/04 (Dr. Celso [name], [name]) 10/05/04   Cancer Brandy [name])    Cataract    Chronic pain    CKD (chronic kidney disease)    Dyspnea    Fatty liver 09/04/2019   GERD (gastroesophageal reflux disease)    Heart murmur    no longer heard   History of hiatal hernia    Hyperlipidemia    Legally blind in right eye, as defined in [place]    Lumbar herniated disc    Migraines    On home oxygen therapy 06/15/2022   Pt was given O2 on D/C from Brandy [name] 04/2022   OSA on CPAP 06/15/2022   PONV (postoperative nausea and vomiting)    Sciatica     MEDICATIONS: Current Outpatient Medications on File Prior to Visit  Medication Sig Dispense Refill   acetaminophen (TYLENOL) 500 MG tablet Take 500 mg by mouth daily as needed for moderate pain.     albuterol (VENTOLIN HFA) 108 (90 Base) MCG/ACT inhaler Inhale 2 puffs into the lungs every 6 (six) hours as needed for wheezing or shortness of breath. 8 g 6   ALPRAZolam (XANAX) 1 MG tablet TAKE 1 TABLET BY MOUTH AT  BEDTIME 30 tablet 1   amitriptyline  (ELAVIL) 75 MG tablet Take 1 tablet (75 mg total) by mouth at bedtime. 90 tablet 3   BELBUCA 300 MCG FILM Place 300 mcg inside cheek every 12 (twelve) hours.     carbamide peroxide (DEBROX) 6.5 % OTIC solution Place 5 drops into the left ear 2 (two) times daily. 15 mL 0   Carboxymethylcellul-Glycerin (LUBRICATING EYE DROPS OP) Place 1 drop into the right eye in the morning, at noon, and at bedtime.     cyanocobalamin (VITAMIN B12) 500 MCG tablet Take 1 tablet (500 mcg total) by mouth daily. 30 tablet 0   diphenoxylate-atropine (LOMOTIL) 2.5-0.025 MG tablet Take 1 tablet by mouth 3 (three) times daily as needed for diarrhea or loose stools. 30 tablet 0   erythromycin ophthalmic ointment Place 1 Application into the right eye 3 (three) times daily. 3.5 g 1   ezetimibe (ZETIA) 10 MG tablet Take 1 tablet (10 mg total) by mouth daily. 90 tablet 1   fluticasone-salmeterol (ADVAIR) 250-50 MCG/ACT AEPB Inhale 1 puff into the lungs in the morning and at bedtime. 180 each 3   loperamide (IMODIUM) 2 MG capsule Take 1-2 capsules (2-4 mg total) by mouth 4 (four) times daily as needed for diarrhea or loose  stools. 30 capsule 1   methocarbamol (ROBAXIN) 500 MG tablet Take 1 tablet (500 mg total) by mouth every 6 (six) hours as needed for muscle spasms. 40 tablet 2   midodrine (PROAMATINE) 10 MG tablet Take 1 tablet (10 mg total) by mouth 3 (three) times daily as needed. 270 tablet 2   Multiple Vitamin (MULTIVITAMIN WITH MINERALS) TABS tablet Take 1 tablet by mouth daily.     omeprazole (PRILOSEC OTC) 20 MG tablet Take 20 mg by mouth daily.     PRESCRIPTION MEDICATION CPAP- At bedtime     triamcinolone cream (KENALOG) 0.1 % Apply 1 Application topically 2 (two) times daily. 30 g 0   VERZENIO 100 MG tablet TAKE 1 TABLET BY MOUTH TWICE DAILY 56 tablet 0   Vitamin D, Ergocalciferol, (DRISDOL) 1.25 MG (50000 UNIT) CAPS capsule Take 50,000 Units by mouth every 7 (seven) days. Monday     White Petrolatum-Mineral Oil  (LUBRICANT EYE NIGHTTIME) OINT Place 1 drop into the left eye at bedtime as needed (dryness).     XIIDRA 5 % SOLN Place 1 drop into the left eye 2 (two) times daily.     No current facility-administered medications on file prior to visit.    ALLERGIES: Allergies  Allergen Reactions   Zithromax [Azithromycin] Shortness Of Breath, Itching and Other (See Comments)    TOTAL BODY ITCHING [EVEN SOLES OF FEET] WHEEZING    Tramadol Itching and Other (See Comments)    Has taken recently without any side effects.   Psyllium Nausea And Vomiting and Other (See Comments)    Metamucil. Sneezing      FAMILY HISTORY: Family History  Problem Relation Age of Onset   Arthritis Mother    Hypertension Mother    Diabetes Mother    High blood pressure Mother    Cancer Father        Lung   Colon cancer Neg Hx    Colon polyps Neg Hx    Esophageal cancer Neg Hx    Rectal cancer Neg Hx    Stomach cancer Neg Hx       Objective:  *** General: No acute distress.  Patient appears ***-groomed.   Head:  Normocephalic/atraumatic Eyes:  Fundi examined but not visualized Neck: supple, no paraspinal tenderness, full range of motion Heart:  Regular rate and rhythm Lungs:  Clear to auscultation bilaterally Back: No paraspinal tenderness Neurological Exam: alert and oriented to person, place, and time.  Speech fluent and not dysarthric, language intact.  CN II-XII intact. Bulk and tone normal, muscle strength 5/5 throughout.  Sensation to light touch intact.  Deep tendon reflexes 2+ throughout, toes downgoing.  Finger to nose testing intact.  Gait normal, Romberg negative.   Shon Millet, DO  CC: ***

## 2022-10-06 NOTE — Progress Notes (Signed)
Nursing Pain Medication Assessment:  Safety precautions to be maintained throughout the outpatient stay will include: orient to surroundings, keep bed in low position, maintain call bell within reach at all times, provide assistance with transfer out of bed and ambulation.  Medication Inspection Compliance: [name] did not comply with our request to bring her pills to be counted. She was reminded that bringing the medication bottles, even when empty, is a requirement.  Medication: None brought in. Pill/Patch Count: None available to be counted. Bottle Appearance: No container available. Did not bring bottle(s) to appointment. Filled Date: N/A Last Medication intake:  Yesterday

## 2022-10-06 NOTE — Progress Notes (Signed)
PROVIDER NOTE: Information contained herein reflects review and annotations entered in association with encounter. Interpretation of such information and data should be left to medically-trained personnel. Information provided to patient can be located elsewhere in the medical record under "Patient Instructions". Document created using STT-dictation technology, any transcriptional errors that may result from process are unintentional.    Patient: Brandy [name]  Service Category: E/M  Provider: Edward Jolly, MD  DOB: 12-22-1965  DOS: 10/06/2022  Referring Provider: Caesar Bookman, NP  MRN: [id number]  Specialty: Interventional Pain Management  PCP: Caesar Bookman, NP  Type: Established Patient  Setting: Ambulatory outpatient    Location: Office  Delivery: Face-to-face     HPI  [name] Peck, a 57 y.o. year old female, is here today because of her Lumbar facet joint syndrome [M47.816]. [name] primary complain today is Back Pain (Lumbar bilateral ) Last encounter: My last encounter with her was on 06/27/22 Pertinent problems: [name] has Spinal stenosis, lumbar region with neurogenic claudication; Post laminectomy syndrome; CKD (chronic kidney disease), stage III (HCC); Depression; Chronic pain syndrome; DDD (degenerative disc disease), lumbosacral; Grade 1 Retrolisthesis (L4-5, L5-S1); Lumbar facet syndrome (Multilevel) (Bilateral) (R>L); Neurogenic pain; and Spondylosis without myelopathy or radiculopathy, lumbosacral region on their pertinent problem list. Pain Assessment: Severity of Chronic pain is reported as a 10-Worst pain ever/10. Location: Back Lower, Left, Right/into hips and legs. Onset: More than a month ago. Quality: Discomfort, Constant, Burning, Stabbing, Other (Comment) (pinching.  not the ususal kind of pain, currently worse). Timing: Constant. Modifying factor(s): patches and rest.  heat/ice occassionally ES  tylenol. Vitals:  height is 5\' 7"  (1.702 m) and weight is 193 lb (87.5 kg). Her temporal temperature is 97.3 F (36.3 C) (abnormal). Her blood pressure is 134/75 and her pulse is 97. Her respiration is 16 and oxygen saturation is 96%.   Reason for encounter: medication management.  No change in medical history since last visit.  Patient's pain is at baseline.  Patient continues multimodal pain regimen as prescribed.  States that it provides pain relief and improvement in functional status. Getting good relief from L-RFA done in December and January, will continue to monitor.  ROS  Constitutional: Denies any fever or chills Gastrointestinal: No reported hemesis, hematochezia, vomiting, or acute GI distress Musculoskeletal:  Mild low back pain Neurological: No reported episodes of acute onset apraxia, aphasia, dysarthria, agnosia, amnesia, paralysis, loss of coordination, or loss of consciousness  Medication Review  ALPRAZolam, Buprenorphine HCl, Carboxymethylcellul-Glycerin, Lifitegrast, Lubricant Eye Nighttime, PRESCRIPTION MEDICATION, Vitamin D (Ergocalciferol), abemaciclib, acetaminophen, albuterol, amitriptyline, carbamide peroxide, cyanocobalamin, diphenoxylate-atropine, erythromycin, ezetimibe, fluticasone-salmeterol, loperamide, methocarbamol, midodrine, multivitamin with minerals, omeprazole, and triamcinolone cream  History Review  Allergy: [name] is allergic to zithromax [azithromycin], tramadol, and psyllium. Drug: [name]  reports no history of drug use. Alcohol:  reports no history of alcohol use. Tobacco:  reports that she quit smoking about 4 years ago. Her smoking use included cigarettes. She has a 6.25 pack-year smoking history. She has never used smokeless tobacco. Social: [name]  reports that she quit smoking about 4 years ago. Her smoking use included cigarettes. She has a 6.25 pack-year smoking history. She has never used  smokeless tobacco. She reports that she does not drink alcohol and does not use drugs. Medical:  has a past medical history of Acute pansinusitis (08/02/2017), Anxiety, Arthritis, ASD (atrial septal defect), Cancer (HCC), Cataract, Chronic pain, CKD (chronic kidney disease), Dyspnea,  Fatty liver (09/04/2019), GERD (gastroesophageal reflux disease), Heart murmur, History of hiatal hernia, Hyperlipidemia, Legally blind in right eye, as defined in [place], Lumbar herniated disc, Migraines, On home oxygen therapy (06/15/2022), OSA on CPAP (06/15/2022), PONV (postoperative nausea and vomiting), and Sciatica. Surgical: [name]  has a past surgical history that includes Breast surgery (Bilateral, 2011); Cataract extraction; Bunionectomy; Cleft palate repair; Cardiac surgery; Ablation; transthoracic echocardiogram; Cardiac catheterization; Lumbar laminectomy/decompression microdiscectomy (Left, 05/23/2016); Lumbar laminectomy/decompression microdiscectomy (Left, 05/23/2016); Shoulder injection (Left, 05/23/2016); Reduction mammaplasty (2011); Eye surgery (Right, 2019); Tubal ligation; and Breast lumpectomy with radioactive seed and sentinel lymph node biopsy (Right, 11/23/2020). Family: family history includes Arthritis in her mother; Cancer in her father; Diabetes in her mother; High blood pressure in her mother; Hypertension in her mother.  Laboratory Chemistry Profile   Renal Lab Results  Component Value Date   BUN 12 09/23/2022   CREATININE 1.37 (H) 09/23/2022   LABCREA 123.4 10/27/2021   BCR 8 08/08/2022   GFRAA >60 02/24/2020   GFRNONAA 45 (L) 09/23/2022    Hepatic Lab Results  Component Value Date   AST 79 (H) 09/23/2022   ALT 68 (H) 09/23/2022   ALBUMIN 3.6 09/23/2022   ALKPHOS 267 (H) 09/23/2022   LIPASE 35 07/12/2021    Electrolytes Lab Results  Component Value Date   NA 140 09/23/2022   K 3.9 09/23/2022   CL 104 09/23/2022   CALCIUM 9.5 09/23/2022   MG 1.2 (L) 08/08/2022    PHOS 2.6 04/26/2022    Bone Lab Results  Component Value Date   VD25OH 30.51 06/26/2019   VD125OH2TOT 62 05/28/2021   UY4034VQ2 <8 05/28/2021   VD2125OH2 62 05/28/2021   25OHVITD1 50 11/27/2019   25OHVITD2 46 11/27/2019   25OHVITD3 4.1 11/27/2019    Inflammation (CRP: Acute Phase) (ESR: Chronic Phase) Lab Results  Component Value Date   CRP 5.6 (H) 07/28/2022   ESRSEDRATE 22 11/27/2019   LATICACIDVEN 1.5 07/23/2022         Note: Above Lab results reviewed.  Recent Imaging Review  MM 3D DIAGNOSTIC MAMMOGRAM BILATERAL BREAST CLINICAL DATA:  RIGHT breast pain. History of RIGHT breast cancer status post lumpectomy in 2022. History of LEFT breast reduction.  EXAM: DIGITAL DIAGNOSTIC BILATERAL MAMMOGRAM WITH TOMOSYNTHESIS  TECHNIQUE: Bilateral digital diagnostic mammography and breast tomosynthesis was performed.  COMPARISON:  Previous exam(s).  ACR Breast Density Category b: There are scattered areas of fibroglandular density.  FINDINGS: There are stable postsurgical changes of both breasts. There are no new dominant masses, suspicious calcifications or secondary signs of malignancy within either breast.  IMPRESSION: No evidence of malignancy within either breast. Stable postsurgical changes.  RECOMMENDATION: Bilateral diagnostic mammogram in 1 year.  I have discussed the findings and recommendations with the patient. If applicable, a reminder letter will be sent to the patient regarding the next appointment.  BI-RADS CATEGORY  2: Benign.  Electronically Signed   By: Bary Richard M.D.   On: 09/14/2022 16:02  MR Lumbar Spine w/o contrast   Narrative CLINICAL DATA:  Spinal stenosis, lumbar. Low back pain radiating to bilateral sides/legs with numbness and weakness in bilateral legs for years. History of lumbar spine laminectomy in 2017.   EXAM: MRI LUMBAR SPINE WITHOUT CONTRAST   TECHNIQUE: Multiplanar, multisequence MR imaging of the lumbar spine  was performed. No intravenous contrast was administered.   COMPARISON:  MR lumbar 01/13/2017; X-ray lumbar 08/26/2021.   FINDINGS: Segmentation:  5 lumbar vertebra   Alignment: 4 mm anterolisthesis  L2-3 is unchanged. 2 mm anterolisthesis L3-4 unchanged. 4 mm retrolisthesis L4-5 and L5-S1 also unchanged.   Vertebrae: Negative for fracture or mass. Normal appearing bone marrow.   Conus medullaris and cauda equina: Conus extends to the L2-3 level. Conus and cauda equina appear normal.   Paraspinal and other soft tissues: Negative for paraspinous mass, adenopathy, fluid collection   Disc levels:   L1-2: Minimal disc degeneration. Moderate to advanced facet degeneration. Interval development of posterior epidural synovial cyst measuring 7 x 8 mm. This flattens the posterior thecal sac but is not causing significant spinal stenosis. Generous size spinal canal.   L2-3: Anterolisthesis. Moderate to advanced facet degeneration bilaterally. No significant stenosis   L3-4: Mild anterolisthesis. Shallow right foraminal disc protrusion unchanged from the prior study. Mild disc degeneration and moderate facet degeneration. Negative for stenosis.   L4-5: Left laminotomy. Diffuse bulging of the disc and mild facet degeneration. Mild left subarticular stenosis, unchanged. Spinal canal adequate in size   L5-S1: Left laminotomy. Retrolisthesis with disc degeneration and spurring. Bilateral facet degeneration. Mild subarticular stenosis bilaterally. No interval change.   IMPRESSION: Postop laminectomy left L4-5 and L5-S1. No recurrent disc protrusion. Subarticular stenosis left L4-5 unchanged. Subarticular stenosis bilaterally L5-S1 unchanged   Multilevel facet degeneration. Interval development of 7 x 8 mm synovial cyst at L1-2 projecting into the posterior epidural space in the midline. This is not causing significant spinal stenosis.     Electronically Signed By: Marlan Palau  M.D. On: 09/29/2021 10:49   MR Lumbar Spine W Wo Contrast   Narrative CLINICAL DATA:  Intermittent back pain for 5 years, bilateral lower extremity burning and numbness. Follow-up radiculopathy.   EXAM: MRI LUMBAR SPINE WITHOUT AND WITH CONTRAST   TECHNIQUE: Multiplanar and multiecho pulse sequences of the lumbar spine were obtained without and with intravenous contrast.   CONTRAST:  17mL MULTIHANCE GADOBENATE DIMEGLUMINE 529 MG/ML IV SOLN   COMPARISON:  MRI of the lumbar spine March 06, 2016 and lumbar spine radiographs August 25, 2016   FINDINGS: SEGMENTATION: For the purposes of this report, the last well-formed intervertebral disc will be reported as L5-S1.   ALIGNMENT: Straightened lumbar lordosis. Minimal grade 1 L2-3 anterolisthesis. Minimal grade 1 L4-5 retrolisthesis. Grade 1 L5-S1 retrolisthesis.   VERTEBRAE:Vertebral bodies are intact. Moderate to severe L5-S1 disc height loss, progressed from prior MRI. Mild desiccation lower lumbar discs. Mild chronic discogenic endplate changes L5-S1. Similar low T1, bright STIR signal single segment upper coccyx, without definite enhancement though, not tailored for evaluation. No abnormal or acute lumbar spine bone marrow signal or enhancement. No abnormal disc enhancement.   CONUS MEDULLARIS: Conus medullaris terminates at L1-2 and demonstrates normal morphology and signal characteristics. Cauda equina is normal. No abnormal cord, leptomeningeal or epidural enhancement.   PARASPINAL AND SOFT TISSUES: Included prevertebral and paraspinal soft tissues are nonacute. Moderate LEFT sacral paraspinal denervation/atrophy.   DISC LEVELS:   T12-L1, L1-2: No disc bulge, canal stenosis nor neural foraminal narrowing. Mild to moderate facet arthropathy.   L2-3: Anterolisthesis. Unroofing of the disc without disc bulge. 4 mm RIGHT facet synovial cyst decreased from 9 mm, decreased fluid component. Moderate to severe RIGHT,  moderate LEFT facet arthropathy and ligamentum flavum redundancy without canal stenosis or neural foraminal narrowing.   L3-4: Small broad-based RIGHT extraforaminal disc protrusion unchanged. Moderate to severe facet arthropathy without canal stenosis or neural foraminal narrowing.   L4-5: Retrolisthesis. LEFT hemilaminectomy. Homogeneous enhancing granulation tissue within the surgical bed, no fluid collection. Small LEFT  subarticular disc protrusion and annular fissure persists. No canal stenosis. Mild LEFT neural foraminal narrowing.   L5-S1: Bilateral L5-S1 laminectomies. Homogeneous enhancing granulation tissue within the surgical bed, no focal fluid collection. Mild facet arthropathy. No canal stenosis. Mild LEFT neural foraminal narrowing.   IMPRESSION: 1. Status post LEFT L4-5 and bilateral L5-S1 laminectomies. 2. Grade 1 L2-3 anterolisthesis, grade 1 L4-5 retrolisthesis, grade 1 L5-S1 retrolisthesis. 3. No canal stenosis. Mild LEFT L4-5 and L5-S1 neural foraminal narrowing. 4. Similar abnormal upper coccyx bone marrow signal, nonspecific and incompletely characterized.     Electronically Signed By: Awilda Metro M.D. On: 01/14/2017 00:23   DG Lumbar Spine 1 View   Narrative CLINICAL DATA:  Left L4-5 decompression.   EXAM: LUMBAR SPINE - 1 VIEW   COMPARISON:  05/23/2016 intraoperative lumbar spine radiographs .   FINDINGS: There is a posterior approach surgical marking device with the tip overlying the spinal canal at the level of the L5-S1 disc. Additional surgical instruments overlie the posterior lower back soft tissues at the L4-S1 levels.   IMPRESSION: Posterior approach surgical marking device position as described.     Electronically Signed By: Delbert Phenix M.D. On: 05/23/2016 10:57  Note: Reviewed        Physical Exam  General appearance: Well nourished, well developed, and well hydrated. In no apparent acute distress Mental status:  Alert, oriented x 3 (person, place, & time)       Respiratory: No evidence of acute respiratory distress Eyes: PERLA Vitals: BP 134/75 (BP Location: Left Arm, Patient Position: Sitting, Cuff Size: Normal)   Pulse 97   Temp (!) 97.3 F (36.3 C) (Temporal)   Resp 16   Ht 5\' 7"  (1.702 m)   Wt 193 lb (87.5 kg)   SpO2 96%   BMI 30.23 kg/m  BMI: Estimated body mass index is 30.23 kg/m as calculated from the following:   Height as of this encounter: 5\' 7"  (1.702 m).   Weight as of this encounter: 193 lb (87.5 kg). Ideal: Ideal body weight: 61.6 kg (135 lb 12.9 oz) Adjusted ideal body weight: 72 kg (158 lb 10.9 oz)  Lumbar Spine Area Exam  Skin & Axial Inspection: Well healed scar from previous spine surgery detected Alignment: Symmetrical Functional ROM: Pain restricted ROM       Stability: No instability detected Muscle Tone/Strength: Functionally intact. No obvious neuro-muscular anomalies detected. Sensory (Neurological): facet mediated Palpation: No palpable anomalies         Mild pain with lumbar extension and facet loading.   Ambulation: Unassisted Gait: Antalgic Posture: Difficulty standing up straight, due to pain  Lower Extremity Exam      Side: Right lower extremity   Side: Left lower extremity  Stability: No instability observed           Stability: No instability observed          Skin & Extremity Inspection: Skin color, temperature, and hair growth are WNL. No peripheral edema or cyanosis. No masses, redness, swelling, asymmetry, or associated skin lesions. No contractures.   Skin & Extremity Inspection: Skin color, temperature, and hair growth are WNL. No peripheral edema or cyanosis. No masses, redness, swelling, asymmetry, or associated skin lesions. No contractures.  Functional ROM: Pain restricted ROM for hip and knee joints           Functional ROM: Pain restricted ROM for hip and knee joints          Muscle Tone/Strength: Functionally intact. No obvious  neuro-muscular anomalies detected.   Muscle Tone/Strength: Functionally intact. No obvious neuro-muscular anomalies detected.  Sensory (Neurological): Unimpaired         Sensory (Neurological): Unimpaired        DTR: Patellar: deferred today Achilles: deferred today Plantar: deferred today   DTR: Patellar: deferred today Achilles: deferred today Plantar: deferred today  Palpation: No palpable anomalies   Palpation: No palpable anomalies     Assessment   Diagnosis Status  1. Lumbar facet syndrome (Multilevel) (Bilateral) (R>L)   2. Spondylosis without myelopathy or radiculopathy, lumbosacral region   3. Grade 1 Retrolisthesis (L4-5, L5-S1)   4. Grade 1 Anterolisthesis (L2-3, L3-4)   5. Post laminectomy syndrome   6. Lumbar facet hypertrophy (Multilevel) (Bilateral)   7. Chronic pain syndrome    Responding Controlled Controlled     Plan of Care   Requested Prescriptions   Signed Prescriptions Disp Refills   BELBUCA 300 MCG FILM 60 each 2    Sig: Place 300 mcg inside cheek every 12 (twelve) hours.     Follow-up plan:   Return in about 3 months (around 01/06/2023) for Medication Management, in person.     Interventional management options:  Considering:   SCS trial Sprint PNS medial branch Diagnostic caudal ESI  Diagnostic left sacroiliac joint block  Diagnostic right IA hip joint injection      Recent Visits Date Type Provider Dept  08/08/22 Office Visit Edward Jolly, MD [facility]  Showing recent visits within past 90 days and meeting all other requirements Today's Visits Date Type Provider Dept  10/06/22 Office Visit Edward Jolly, MD [facility]  Showing today's visits and meeting all other requirements Future Appointments No visits were found meeting these conditions. Showing future appointments within next 90 days and meeting all other requirements  I discussed the assessment and treatment plan with the patient. The patient was  provided an opportunity to ask questions and all were answered. The patient agreed with the plan and demonstrated an understanding of the instructions.  Patient advised to call back or seek an in-person evaluation if the symptoms or condition worsens.  Duration of encounter: .  Total time on encounter, as per AMA guidelines included both the face-to-face and non-face-to-face time personally spent by the physician and/or other qualified health care professional(s) on the day of the encounter (includes time in activities that require the physician or other qualified health care professional and does not include time in activities normally performed by clinical staff). Physician's time may include the following activities when performed: preparing to see the patient (eg, review of tests, pre-charting review of records) obtaining and/or reviewing separately obtained history performing a medically appropriate examination and/or evaluation counseling and educating the patient/family/caregiver ordering medications, tests, or procedures referring and communicating with other health care professionals (when not separately reported) documenting clinical information in the electronic or other health record independently interpreting results (not separately reported) and communicating results to the patient/ family/caregiver care coordination (not separately reported)  Note by: Edward Jolly, MD Date: 10/06/2022; Time: 1:54 PM

## 2022-10-10 ENCOUNTER — Ambulatory Visit: Payer: Medicare Other | Admitting: Neurology

## 2022-10-10 ENCOUNTER — Encounter: Payer: Self-pay | Admitting: Neurology

## 2022-10-11 DIAGNOSIS — S41101A Unspecified open wound of right upper arm, initial encounter: Secondary | ICD-10-CM | POA: Diagnosis not present

## 2022-10-11 DIAGNOSIS — L728 Other follicular cysts of the skin and subcutaneous tissue: Secondary | ICD-10-CM | POA: Diagnosis not present

## 2022-10-19 ENCOUNTER — Ambulatory Visit: Payer: Medicare Other | Admitting: Family

## 2022-10-25 DIAGNOSIS — R0600 Dyspnea, unspecified: Secondary | ICD-10-CM | POA: Diagnosis not present

## 2022-10-27 ENCOUNTER — Other Ambulatory Visit: Payer: Self-pay | Admitting: Hematology

## 2022-10-27 ENCOUNTER — Other Ambulatory Visit: Payer: Self-pay

## 2022-10-27 DIAGNOSIS — C50919 Malignant neoplasm of unspecified site of unspecified female breast: Secondary | ICD-10-CM

## 2022-10-28 ENCOUNTER — Encounter: Payer: Self-pay | Admitting: Hematology

## 2022-10-28 ENCOUNTER — Inpatient Hospital Stay: Payer: Medicare Other

## 2022-10-28 ENCOUNTER — Inpatient Hospital Stay: Payer: Medicare Other | Attending: Hematology

## 2022-10-28 VITALS — BP 143/64 | HR 79 | Temp 98.2°F | Resp 16

## 2022-10-28 DIAGNOSIS — E876 Hypokalemia: Secondary | ICD-10-CM | POA: Insufficient documentation

## 2022-10-28 DIAGNOSIS — Z170 Estrogen receptor positive status [ER+]: Secondary | ICD-10-CM | POA: Diagnosis not present

## 2022-10-28 DIAGNOSIS — C50919 Malignant neoplasm of unspecified site of unspecified female breast: Secondary | ICD-10-CM

## 2022-10-28 DIAGNOSIS — I700 Atherosclerosis of aorta: Secondary | ICD-10-CM | POA: Insufficient documentation

## 2022-10-28 DIAGNOSIS — C7801 Secondary malignant neoplasm of right lung: Secondary | ICD-10-CM | POA: Insufficient documentation

## 2022-10-28 DIAGNOSIS — C50511 Malignant neoplasm of lower-outer quadrant of right female breast: Secondary | ICD-10-CM | POA: Diagnosis not present

## 2022-10-28 DIAGNOSIS — C7802 Secondary malignant neoplasm of left lung: Secondary | ICD-10-CM | POA: Insufficient documentation

## 2022-10-28 DIAGNOSIS — Z79899 Other long term (current) drug therapy: Secondary | ICD-10-CM | POA: Insufficient documentation

## 2022-10-28 DIAGNOSIS — R7989 Other specified abnormal findings of blood chemistry: Secondary | ICD-10-CM | POA: Diagnosis not present

## 2022-10-28 DIAGNOSIS — Z5111 Encounter for antineoplastic chemotherapy: Secondary | ICD-10-CM | POA: Insufficient documentation

## 2022-10-28 LAB — CMP (CANCER CENTER ONLY)
ALT: 50 U/L — ABNORMAL HIGH (ref 0–44)
AST: 50 U/L — ABNORMAL HIGH (ref 15–41)
Albumin: 3.5 g/dL (ref 3.5–5.0)
Alkaline Phosphatase: 254 U/L — ABNORMAL HIGH (ref 38–126)
Anion gap: 8 (ref 5–15)
BUN: 14 mg/dL (ref 6–20)
CO2: 28 mmol/L (ref 22–32)
Calcium: 9.1 mg/dL (ref 8.9–10.3)
Chloride: 105 mmol/L (ref 98–111)
Creatinine: 1.28 mg/dL — ABNORMAL HIGH (ref 0.44–1.00)
GFR, Estimated: 49 mL/min — ABNORMAL LOW (ref >60)
Glucose, Bld: 102 mg/dL — ABNORMAL HIGH (ref 70–99)
Potassium: 3.7 mmol/L (ref 3.5–5.1)
Sodium: 141 mmol/L (ref 135–145)
Total Bilirubin: 0.6 mg/dL (ref 0.3–1.2)
Total Protein: 7.2 g/dL (ref 6.5–8.1)

## 2022-10-28 LAB — CBC WITH DIFFERENTIAL (CANCER CENTER ONLY)
Abs Immature Granulocytes: 0.02 10*3/uL (ref 0.00–0.07)
Basophils Absolute: 0.1 10*3/uL (ref 0.0–0.1)
Basophils Relative: 1 %
Eosinophils Absolute: 0.1 10*3/uL (ref 0.0–0.5)
Eosinophils Relative: 2 %
HCT: 32.9 % — ABNORMAL LOW (ref 36.0–46.0)
Hemoglobin: 11 g/dL — ABNORMAL LOW (ref 12.0–15.0)
Immature Granulocytes: 0 %
Lymphocytes Relative: 62 %
Lymphs Abs: 2.8 10*3/uL (ref 0.7–4.0)
MCH: 34.8 pg — ABNORMAL HIGH (ref 26.0–34.0)
MCHC: 33.4 g/dL (ref 30.0–36.0)
MCV: 104.1 fL — ABNORMAL HIGH (ref 80.0–100.0)
Monocytes Absolute: 0.3 10*3/uL (ref 0.1–1.0)
Monocytes Relative: 6 %
Neutro Abs: 1.3 10*3/uL — ABNORMAL LOW (ref 1.7–7.7)
Neutrophils Relative %: 29 %
Platelet Count: 255 10*3/uL (ref 150–400)
RBC: 3.16 MIL/uL — ABNORMAL LOW (ref 3.87–5.11)
RDW: 13.6 % (ref 11.5–15.5)
WBC Count: 4.5 10*3/uL (ref 4.0–10.5)
nRBC: 0 % (ref 0.0–0.2)

## 2022-10-28 MED ORDER — FULVESTRANT 250 MG/5ML IM SOSY
500.0000 mg | PREFILLED_SYRINGE | Freq: Once | INTRAMUSCULAR | Status: AC
  Administered 2022-10-28: 500 mg via INTRAMUSCULAR
  Filled 2022-10-28: qty 10

## 2022-10-28 NOTE — Progress Notes (Signed)
Pt. Here for Faslodex injections.  States she took her Benadryl and Pepcid before coming in for her injections today.

## 2022-10-30 ENCOUNTER — Other Ambulatory Visit: Payer: Self-pay | Admitting: Family

## 2022-10-31 DIAGNOSIS — R0600 Dyspnea, unspecified: Secondary | ICD-10-CM | POA: Diagnosis not present

## 2022-11-05 ENCOUNTER — Encounter: Payer: Self-pay | Admitting: Hematology

## 2022-11-11 ENCOUNTER — Encounter: Payer: Self-pay | Admitting: Pharmacist Clinician (PhC)/ Clinical Pharmacy Specialist

## 2022-11-11 ENCOUNTER — Ambulatory Visit
Payer: Medicare Other | Attending: Internal Medicine | Admitting: Pharmacist Clinician (PhC)/ Clinical Pharmacy Specialist

## 2022-11-11 DIAGNOSIS — E782 Mixed hyperlipidemia: Secondary | ICD-10-CM

## 2022-11-11 NOTE — Progress Notes (Signed)
Office Visit    Patient Name: [name] Date of Encounter: 11/11/2022  Primary Care Provider:  Caesar Bookman, NP Primary Cardiologist:  Thomasene Ripple, DO  Chief Complaint    Hyperlipidemia   Significant Past Medical History   Orthostatic hypotension On midodrine 10 mg tid  Vit D deficiency WNL on weekly 50,000 IU  Chronic respiratory failure Uses O2     Allergies  Allergen Reactions   Zithromax [Azithromycin] Shortness Of Breath, Itching and Other (See Comments)    TOTAL BODY ITCHING [EVEN SOLES OF FEET] WHEEZING    Tramadol Itching and Other (See Comments)    Has taken recently without any side effects.   Psyllium Nausea And Vomiting and Other (See Comments)    Metamucil. Sneezing      History of Present Illness    Brandy [name] is a 57 y.o. female patient of Dr Servando Salina, in the office today to discuss cholesterol management.  Patient was previously on rosuvastatin, however she is not sure why it was discontinued.  She believes she was told it wasn't needed.  In January of this year her LDL cholesterol was at 112 and a year before that she had a coronary CT which showed no coronary calcium.  Unfortunately since stopping her statin the LDL has increased to 161.  Her triglycerides were also elevated at [employer] [facility]:  [facility] Medicare [id number]  LDL Cholesterol goal:  LDL < 100  Current Medications:   ezetimibe 10 mg  Previously tried:  atorvastatin, rosuvastatin - not sure why stopped  Family Hx:   mgm had MI, mother with DM, hypertension, still living; father died from cancer in his 51's; no health issues with siblings that she is aware of (5 siblings); 3 children (28,31,34) all healthy  Social Hx: Tobacco: no, quit 2019 Alcohol: no     Diet:  home cooked meals, from scratch; protein usually fish and chicken, only occasional beef or pork; vegetables fresh or frozen; doesn't snack much  Exercise: walk twice daily about 20-30  minutes per walk  Adherence Assessment  Do you ever forget to take your medication? [] Yes [x] No  Do you ever skip doses due to side effects? [] Yes [x] No  Do you have trouble affording your medicines? [x] Yes -eyedrops [] No  Are you ever unable to pick up your medication due to transportation difficulties? [] Yes [x] No   Adherence strategy: 7 day pill minder   Accessory Clinical Findings   Lab Results  Component Value Date   CHOL 299 (H) 09/13/2022   HDL 82 09/13/2022   LDLCALC 161 (H) 09/13/2022   TRIG 365 (H) 09/13/2022   CHOLHDL 3.6 09/13/2022    Lab Results  Component Value Date   ALT 50 (H) 10/28/2022   AST 50 (H) 10/28/2022   GGT 340 (H) 07/05/2019   ALKPHOS 254 (H) 10/28/2022   BILITOT 0.6 10/28/2022   Lab Results  Component Value Date   CREATININE 1.28 (H) 10/28/2022   BUN 14 10/28/2022   NA 141 10/28/2022   K 3.7 10/28/2022   CL 105 10/28/2022   CO2 28 10/28/2022   Lab Results  Component Value Date   HGBA1C 5.3 07/26/2022    Home Medications    Current Outpatient Medications  Medication Sig Dispense Refill   acetaminophen (TYLENOL) 500 MG tablet Take 500 mg by mouth daily as needed for moderate pain.     albuterol (VENTOLIN HFA) 108 (90 Base) MCG/ACT inhaler Inhale 2 puffs into the  lungs every 6 (six) hours as needed for wheezing or shortness of breath. 8 g 6   ALPRAZolam (XANAX) 1 MG tablet TAKE 1 TABLET BY MOUTH AT  BEDTIME 30 tablet 1   amitriptyline (ELAVIL) 75 MG tablet Take 1 tablet (75 mg total) by mouth at bedtime. 90 tablet 3   BELBUCA 300 MCG FILM Place 300 mcg inside cheek every 12 (twelve) hours. 60 each 2   Carboxymethylcellul-Glycerin (LUBRICATING EYE DROPS OP) Place 1 drop into the right eye in the morning, at noon, and at bedtime.     cyanocobalamin (VITAMIN B12) 500 MCG tablet Take 1 tablet (500 mcg total) by mouth daily. 30 tablet 0   diphenoxylate-atropine (LOMOTIL) 2.5-0.025 MG tablet Take 1 tablet by mouth 3 (three) times daily as  needed for diarrhea or loose stools. 30 tablet 0   erythromycin ophthalmic ointment Place 1 Application into the right eye 3 (three) times daily. 3.5 g 1   ezetimibe (ZETIA) 10 MG tablet Take 1 tablet (10 mg total) by mouth daily. 90 tablet 1   fluticasone-salmeterol (ADVAIR) 250-50 MCG/ACT AEPB Inhale 1 puff into the lungs in the morning and at bedtime. 180 each 3   loperamide (IMODIUM) 2 MG capsule Take 1-2 capsules (2-4 mg total) by mouth 4 (four) times daily as needed for diarrhea or loose stools. 30 capsule 1   methocarbamol (ROBAXIN) 500 MG tablet Take 1 tablet (500 mg total) by mouth every 6 (six) hours as needed for muscle spasms. 40 tablet 2   midodrine (PROAMATINE) 10 MG tablet Take 1 tablet (10 mg total) by mouth 3 (three) times daily as needed. 270 tablet 2   Multiple Vitamin (MULTIVITAMIN WITH MINERALS) TABS tablet Take 1 tablet by mouth daily.     omeprazole (PRILOSEC OTC) 20 MG tablet Take 20 mg by mouth daily.     PRESCRIPTION MEDICATION CPAP- At bedtime     triamcinolone cream (KENALOG) 0.1 % Apply 1 Application topically 2 (two) times daily. 30 g 0   VERZENIO 100 MG tablet TAKE 1 TABLET BY MOUTH TWICE DAILY 56 tablet 0   Vitamin D, Ergocalciferol, (DRISDOL) 1.25 MG (50000 UNIT) CAPS capsule Take 50,000 Units by mouth every 7 (seven) days. Monday     White Petrolatum-Mineral Oil (LUBRICANT EYE NIGHTTIME) OINT Place 1 drop into the left eye at bedtime as needed (dryness).     XIIDRA 5 % SOLN Place 1 drop into the left eye 2 (two) times daily.     No current facility-administered medications for this visit.     Assessment & Plan    Hyperlipidemia Assessment: Patient with elevated LDL not at goal of < 100 Most recent LDL 161 on 09/13/22 Has been compliant with ezetimibe : started in April Unsure why statins were discontinued.  Has a bottle at home, atorvastatin 10 mg  Plan: Will have her re-start the atorvastatin 10 mg.  Do not believe this will get her to goal, but with her  history of chronic pain issues, I would rather start low and build up the dose as she tolerates.  Repeat labs after:  2 months Lipid Liver function Triglycerides elevated, however have usually been lower.  If she can tolerate statin, will see what drop in TG we can get before starting fibrate or omega 3.  Patient on limited income.  She will try to adjust diet as can afford.      Phillips Hay, PharmD CPP [facility] [address]  [place], [place] [address] [phone]  11/11/2022, 12:17 PM

## 2022-11-11 NOTE — Assessment & Plan Note (Addendum)
Assessment: Patient with elevated LDL not at goal of < 100 Most recent LDL 161 on 09/13/22 Has been compliant with ezetimibe : started in April Unsure why statins were discontinued.  Has a bottle at home, atorvastatin 10 mg  Plan: Will have her re-start the atorvastatin 10 mg.  Do not believe this will get her to goal, but with her history of chronic pain issues, I would rather start low and build up the dose as she tolerates.  Repeat labs after:  2 months Lipid Liver function Triglycerides elevated, however have usually been lower.  If she can tolerate statin, will see what drop in TG we can get before starting fibrate or omega 3.  Patient on limited income.  She will try to adjust diet as can afford.

## 2022-11-11 NOTE — Patient Instructions (Signed)
Your Results:             Your most recent labs Goal  Total Cholesterol 299 < 200  Triglycerides 365 < 150  HDL (happy/good cholesterol) 82 > 40  LDL (lousy/bad cholesterol 161 < 70   Medication changes:  Call me when you get home and let me know what cholesterol medication you have.  Belenda Cruise at [phone])  Continue using the ezetimibe 10 mg daily  Thank you for choosing [facility]   High Triglycerides Eating Plan Triglycerides are a type of fat in the blood. High levels of triglycerides can increase your risk of heart disease and stroke. If your triglyceride levels are high, choosing the right foods can help lower your triglycerides and keep your heart healthy. Work with your health care provider or a dietitian to develop an eating plan that is right for you. What are tips for following this plan? General guidelines  Lose weight, if you are overweight. For most people, losing 5-10 lb (2-5 kg) helps lower triglyceride levels. A weight-loss plan may include: 30 minutes of exercise at least 5 days a week. Reducing the amount of calories, sugar, and fat you eat. Eat a wide variety of fresh fruits, vegetables, and whole grains. These foods are high in fiber. Eat foods that contain healthy fats, such as fatty fish, nuts, seeds, and olive oil. Avoid foods that are high in added sugar, added salt (sodium), and saturated fat. Avoid low-fiber, refined carbohydrates such as white bread, crackers, noodles, and white rice. Avoid foods with trans fats or partially hydrogenated oils, such as fried foods or stick margarine. If you drink alcohol: Limit how much you have to: 0-1 drink a day for women who are not pregnant. 0-2 drinks a day for men. Your health care provider may recommend that you drink less than these amounts depending on your overall health. Know how much alcohol is in a drink. In the [place] one drink equals one 12 oz bottle of beer (355 mL), one 5 oz glass of wine (148 mL), or  one 1 oz glass of hard liquor (44 mL). Reading food labels Check food labels for: The amount of saturated fat. Choose foods with no or very little saturated fat (less than 2 g). The amount of trans fat. Choose foods with no transfat. The amount of cholesterol. Choose foods that are low in cholesterol. The amount of sodium. Choose foods with less than 140 milligrams (mg) per serving. Shopping Buy dairy products labeled as nonfat (skim) or low-fat (1%). Avoid buying processed or prepackaged foods. These are often high in added sugar, sodium, and fat. Cooking Choose healthy fats when cooking, such as olive oil, avocado oil, or canola oil. Cook foods using lower fat methods, such as baking, broiling, boiling, or grilling. Make your own sauces, dressings, and marinades when possible, instead of buying them. Store-bought sauces, dressings, and marinades are often high in sodium and sugar. Meal planning Eat more home-cooked food and less restaurant, buffet, and fast food. Eat fatty fish at least 2 times each week. Examples of fatty fish include salmon, trout, sardines, mackerel, tuna, and herring. If you eat whole eggs, do not eat more than 4 egg yolks per week.  What foods should I eat? Fruits All fresh, canned (in natural juice), or frozen fruits. Vegetables Fresh or frozen vegetables. Low-sodium canned vegetables. Grains Whole wheat or whole grain breads, crackers, cereals, and pasta. Unsweetened oatmeal. Bulgur. Barley. Quinoa. Brown rice. Whole wheat flour tortillas. Meats and  other proteins Skinless chicken or [place]. Ground chicken or [place]. Lean cuts of pork, trimmed of fat. Fish and seafood, especially salmon, trout, and herring. Egg whites. Dried beans, peas, or lentils. Unsalted nuts or seeds. Unsalted canned beans. Natural peanut or almond butter or other nut butters. Dairy Low-fat dairy products. Skim or low-fat (1%) milk. Reduced fat (2%) and low-sodium cheese. Low-fat ricotta  cheese. Low-fat cottage cheese. Plain, low-fat yogurt. Fats and oils Tub margarine without trans fats. Light or reduced-fat mayonnaise. Light or reduced-fat salad dressings. Avocado. Safflower, olive, sunflower, soybean, and canola oils. The items listed above may not be a complete list of recommended foods and beverages. Talk with your dietitian about what dietary choices are best for you.  What foods should I avoid? Fruits Sweetened dried fruit. Canned fruit in syrup. Fruit juice. Vegetables Creamed or fried vegetables. Vegetables in a cheese sauce. Grains White bread. White (regular) pasta. White rice. Cornbread. Bagels. Pastries. Crackers that contain trans fat. Meats and other proteins Fatty cuts of meat. Ribs. Chicken wings. Tomasa Blase. Sausage. Bologna. Salami. Chitterlings. Fatback. Hot dogs. Bratwurst. Packaged lunch meats. Dairy Whole or reduced-fat (2%) milk. Half-and-half. Cream cheese. Full-fat or sweetened yogurt. Full-fat cheese. Nondairy creamers. Whipped toppings. Processed cheese or cheese spreads. Cheese curds. Fats and oils Butter. Stick margarine. Lard. Shortening. Ghee. Bacon fat. Tropical oils, such as coconut, palm kernel, or palm oils. Beverages Alcohol. Sweetened drinks, such as soda, lemonade, fruit drinks, or punches. Sweets and desserts Corn syrup. Sugars. Honey. Molasses. Candy. Jam and jelly. Syrup. Sweetened cereals. Cookies. Pies. Cakes. Donuts. Muffins. Ice cream. Condiments Store-bought sauces, dressings, and marinades that are high in sugar, such as ketchup and barbecue sauce. The items listed above may not be a complete list of foods and beverages you should avoid. Talk with your dietitian about what dietary choices are best for you. Summary High levels of triglycerides can increase the risk of heart disease and stroke. Choosing the right foods can help lower your triglycerides. Eat plenty of fresh fruits, vegetables, and whole grains. Choose low-fat dairy  and lean meats. Eat fatty fish at least twice a week. Avoid processed and prepackaged foods with added sugar, sodium, saturated fat, and trans fat. If you need suggestions or have questions about what types of food are good for you, talk with your health care provider or a dietitian. This information is not intended to replace advice given to you by your health care provider. Make sure you discuss any questions you have with your health care provider. Document Revised: 10/02/2020 Document Reviewed: 10/02/2020 Elsevier Patient Education  2024 [employer].

## 2022-11-15 ENCOUNTER — Telehealth: Payer: Medicare Other

## 2022-11-15 NOTE — Telephone Encounter (Signed)
CPAP machine and supplies will need to be ordered by Pulmonology.

## 2022-11-15 NOTE — Telephone Encounter (Signed)
Patient left a voicemail this afternoon in regards to a DME order for CPAP supplies that needs to be faxed over to [facility] along with Sleep Study results.  Message routed to Ngetich, Donalee Citrin, NP

## 2022-11-16 ENCOUNTER — Encounter: Payer: Self-pay | Admitting: Hematology

## 2022-11-16 NOTE — Telephone Encounter (Signed)
Patient was agreement and we be calling the Pulmonology for the CPAP machine and supplies.  Message routed back to Ngetich, Brandy Citrin, NP

## 2022-11-16 NOTE — Telephone Encounter (Signed)
Noted  

## 2022-11-24 ENCOUNTER — Telehealth: Payer: Self-pay | Admitting: Pulmonary Disease

## 2022-11-24 ENCOUNTER — Other Ambulatory Visit: Payer: Self-pay

## 2022-11-24 DIAGNOSIS — C50919 Malignant neoplasm of unspecified site of unspecified female breast: Secondary | ICD-10-CM

## 2022-11-24 DIAGNOSIS — G4733 Obstructive sleep apnea (adult) (pediatric): Secondary | ICD-10-CM

## 2022-11-24 NOTE — Telephone Encounter (Signed)
Patient states needs order for CPAP supplies. Patient uses [facility]. Patient phone number is [phone].

## 2022-11-24 NOTE — Telephone Encounter (Signed)
OK to order CPAP supplies 

## 2022-11-25 ENCOUNTER — Other Ambulatory Visit: Payer: Self-pay

## 2022-11-25 ENCOUNTER — Inpatient Hospital Stay: Payer: Medicare Other | Attending: Hematology

## 2022-11-25 ENCOUNTER — Inpatient Hospital Stay: Payer: Medicare Other

## 2022-11-25 VITALS — BP 122/60 | HR 78 | Temp 97.7°F | Resp 16

## 2022-11-25 DIAGNOSIS — R0600 Dyspnea, unspecified: Secondary | ICD-10-CM | POA: Diagnosis not present

## 2022-11-25 DIAGNOSIS — Z5111 Encounter for antineoplastic chemotherapy: Secondary | ICD-10-CM | POA: Diagnosis not present

## 2022-11-25 DIAGNOSIS — C50511 Malignant neoplasm of lower-outer quadrant of right female breast: Secondary | ICD-10-CM | POA: Insufficient documentation

## 2022-11-25 DIAGNOSIS — Z170 Estrogen receptor positive status [ER+]: Secondary | ICD-10-CM | POA: Diagnosis not present

## 2022-11-25 DIAGNOSIS — C50919 Malignant neoplasm of unspecified site of unspecified female breast: Secondary | ICD-10-CM

## 2022-11-25 DIAGNOSIS — C7800 Secondary malignant neoplasm of unspecified lung: Secondary | ICD-10-CM | POA: Diagnosis not present

## 2022-11-25 DIAGNOSIS — Z79818 Long term (current) use of other agents affecting estrogen receptors and estrogen levels: Secondary | ICD-10-CM | POA: Diagnosis not present

## 2022-11-25 LAB — CMP (CANCER CENTER ONLY)
ALT: 32 U/L (ref 0–44)
AST: 47 U/L — ABNORMAL HIGH (ref 15–41)
Albumin: 3.6 g/dL (ref 3.5–5.0)
Alkaline Phosphatase: 210 U/L — ABNORMAL HIGH (ref 38–126)
Anion gap: 8 (ref 5–15)
BUN: 16 mg/dL (ref 6–20)
CO2: 30 mmol/L (ref 22–32)
Calcium: 9.5 mg/dL (ref 8.9–10.3)
Chloride: 103 mmol/L (ref 98–111)
Creatinine: 1.33 mg/dL — ABNORMAL HIGH (ref 0.44–1.00)
GFR, Estimated: 46 mL/min — ABNORMAL LOW (ref >60)
Glucose, Bld: 105 mg/dL — ABNORMAL HIGH (ref 70–99)
Potassium: 3.9 mmol/L (ref 3.5–5.1)
Sodium: 141 mmol/L (ref 135–145)
Total Bilirubin: 0.6 mg/dL (ref 0.3–1.2)
Total Protein: 7.1 g/dL (ref 6.5–8.1)

## 2022-11-25 LAB — CBC WITH DIFFERENTIAL (CANCER CENTER ONLY)
Abs Immature Granulocytes: 0.01 10*3/uL (ref 0.00–0.07)
Basophils Absolute: 0 10*3/uL (ref 0.0–0.1)
Basophils Relative: 1 %
Eosinophils Absolute: 0.1 10*3/uL (ref 0.0–0.5)
Eosinophils Relative: 2 %
HCT: 36 % (ref 36.0–46.0)
Hemoglobin: 11.8 g/dL — ABNORMAL LOW (ref 12.0–15.0)
Immature Granulocytes: 0 %
Lymphocytes Relative: 56 %
Lymphs Abs: 2.3 10*3/uL (ref 0.7–4.0)
MCH: 34.3 pg — ABNORMAL HIGH (ref 26.0–34.0)
MCHC: 32.8 g/dL (ref 30.0–36.0)
MCV: 104.7 fL — ABNORMAL HIGH (ref 80.0–100.0)
Monocytes Absolute: 0.3 10*3/uL (ref 0.1–1.0)
Monocytes Relative: 7 %
Neutro Abs: 1.4 10*3/uL — ABNORMAL LOW (ref 1.7–7.7)
Neutrophils Relative %: 34 %
Platelet Count: 232 10*3/uL (ref 150–400)
RBC: 3.44 MIL/uL — ABNORMAL LOW (ref 3.87–5.11)
RDW: 14 % (ref 11.5–15.5)
WBC Count: 4.1 10*3/uL (ref 4.0–10.5)
nRBC: 0 % (ref 0.0–0.2)

## 2022-11-25 MED ORDER — FULVESTRANT 250 MG/5ML IM SOSY
500.0000 mg | PREFILLED_SYRINGE | Freq: Once | INTRAMUSCULAR | Status: AC
  Administered 2022-11-25: 500 mg via INTRAMUSCULAR
  Filled 2022-11-25: qty 10

## 2022-11-25 NOTE — Telephone Encounter (Signed)
CPAP supplies order has been placed.   Nothing further needed.

## 2022-11-25 NOTE — Progress Notes (Signed)
Patient took her premedications at home today. Two beneadryl (50mg ) and one pepcid PO- 0800.

## 2022-11-25 NOTE — Patient Instructions (Signed)

## 2022-11-26 ENCOUNTER — Other Ambulatory Visit: Payer: Self-pay | Admitting: Family

## 2022-11-26 DIAGNOSIS — C50919 Malignant neoplasm of unspecified site of unspecified female breast: Secondary | ICD-10-CM

## 2022-11-26 DIAGNOSIS — F5101 Primary insomnia: Secondary | ICD-10-CM

## 2022-11-28 NOTE — Telephone Encounter (Signed)
Medication last refilled 09/29/22. Treatment agreement is up to date.  Medication pended and sent to Abbey Chatters, NP

## 2022-11-29 ENCOUNTER — Other Ambulatory Visit: Payer: Self-pay | Admitting: Hematology

## 2022-11-29 DIAGNOSIS — C50919 Malignant neoplasm of unspecified site of unspecified female breast: Secondary | ICD-10-CM

## 2022-12-01 DIAGNOSIS — R0600 Dyspnea, unspecified: Secondary | ICD-10-CM | POA: Diagnosis not present

## 2022-12-09 ENCOUNTER — Other Ambulatory Visit: Payer: Self-pay | Admitting: Nurse Practitioner

## 2022-12-09 ENCOUNTER — Other Ambulatory Visit: Payer: Self-pay | Admitting: Family

## 2022-12-09 DIAGNOSIS — C50919 Malignant neoplasm of unspecified site of unspecified female breast: Secondary | ICD-10-CM

## 2022-12-09 DIAGNOSIS — F5101 Primary insomnia: Secondary | ICD-10-CM

## 2022-12-12 NOTE — Telephone Encounter (Signed)
Patient has request refill on medication Xanax. Patient last refill dated 11/28/2022. Patient has Non Opioid Contract on file dated 06/09/2021. Patient has upcoming appointment 03/15/2023. Update Contract added to appointment note. Medication pend and sent to PCP Ngetich, Donalee Citrin, NP for approval.

## 2022-12-14 ENCOUNTER — Encounter (HOSPITAL_BASED_OUTPATIENT_CLINIC_OR_DEPARTMENT_OTHER): Payer: Self-pay | Admitting: Pulmonary Disease

## 2022-12-14 ENCOUNTER — Ambulatory Visit (INDEPENDENT_AMBULATORY_CARE_PROVIDER_SITE_OTHER): Payer: Medicare Other | Admitting: Pulmonary Disease

## 2022-12-14 VITALS — BP 120/60 | HR 83 | Temp 98.7°F | Ht 68.0 in | Wt 196.4 lb

## 2022-12-14 DIAGNOSIS — G4733 Obstructive sleep apnea (adult) (pediatric): Secondary | ICD-10-CM | POA: Diagnosis not present

## 2022-12-14 DIAGNOSIS — R0602 Shortness of breath: Secondary | ICD-10-CM

## 2022-12-14 NOTE — Patient Instructions (Addendum)
Shortness of breath - prior covid infection Deconditioning --Prior methacholine challenge which was negative for asthma --CONTINUE Advair 250-50 mcg ONE puff TWICE a day. REFILL --CONTINUE Albuterol AS NEEDED for shortness of breath --Use nebulizers AS NEEDED for exacerbations --Graduated Pulmonary Rehab in 06/2022-08/2022 --Continue regular aerobic exercise daily up to 20-30 minutes  OSA Patient uses NIV for more than four hours nightly for at least 70% of nights during the last three months of usage. The patient has been using and benefiting from PAP use and will continue to benefit from therapy.  --Obtain CPAP compliance report --RESEND CPAP supplies (head straps, mask etc) to correct DME: [facility]

## 2022-12-14 NOTE — Progress Notes (Signed)
Subjective:   PATIENT ID: Brandy [name] GENDER: female DOB: [birth year], MRN: [id number]   HPI  Chief Complaint  Patient presents with   Follow-up    Follow up.    Reason for Visit: Follow-up  [name] is a 57 year old female former smoker with metastatic breast cancer, obesity, chronic diastolic heart failure, chronic pain, anxiety, ASD s/p post-repair in 2006 and insomnia who presents for follow-up  Synopsis: Initially referred for evaluation of shortness of breath. PFTs with mild restrictive defect and reduced DLCO. On verzenio and faslodex for metastatic breast cancer 2021 - Methacholine challenge negative. Prior pulmonary nodules decreased in size since 02/2019, likely representing mets post-treatment. CT with no ILD. 2022 - S/p right lumpectomy in June with no breast cancer progression. Worsening dyspnea with unclear cause.Some coughing, nonproductive. Tried Breo which was ineffective. 2023 - Dx with OSA and on CPAP. Hospitalized in Nov for electrolyte issues and discharged on home O2. Ambulatory O2 in Dec with no desaturations.  05/18/22 Since our last visit she was started on CPAP and followed by Cardiology. She reports she is compliant with therapy. Last month she was hospitalized for electrolyte issues and discharged on home O2. Her last potassium was normal which Oncology monitors monthly. Reports worsening fatigue. Has productive cough. She is compliant with Advair but does not feels this help. She reports that walking short distances will cause her be winded. No wheezing. Has increased her O2 but unknown what her saturations are.  08/15/22 Since our last visit she was hospitalized from 07/22/22-07/29/22 for covid 19 pneumonia and treated with co-comitant bacterial pneumonia. Discharged on 2L O2. Will wear at home when she feels short of breath. She did not need O2 prior to this hospitalization and did not need any during pulmonary rehab. She is planning  to return to pulmonary rehab. She is stressed and planning to leave her husband and moving out soon.  12/14/22 Overall respiratory smptoms is good with current inhalers. Completed pulm rehab in March. Walking her dog daily and exercising twice a day with walks. The humidity will affect her breathing but no problems reported. She is compliant with her CPAP nightly and reports >4 hours. Does have some leakage because her straps are loose. Has not received her CPAP supplies due to wrong DME reported.      No data to display         Social History: Quit smoking in 2020. 1/4 ppd x 25 years.  Stressed about her husband, poor housing situation  Past Medical History:  Diagnosis Date   Acute pansinusitis 08/02/2017   Anxiety    Arthritis    ASD (atrial septal defect)    s/p closure with Amplatzer device 10/05/04 (Dr. Celso Amy, [facility]) 10/05/04   Cancer [facility])    Cataract    Chronic pain    CKD (chronic kidney disease)    Dyspnea    Fatty liver 09/04/2019   GERD (gastroesophageal reflux disease)    Heart murmur    no longer heard   History of hiatal hernia    Hyperlipidemia    Legally blind in right eye, as defined in [place]    Lumbar herniated disc    Migraines    On home oxygen therapy 06/15/2022   Pt was given O2 on D/C from [facility] 04/2022   OSA on CPAP 06/15/2022   PONV (postoperative nausea and vomiting)    Sciatica      Allergies  Allergen Reactions  Zithromax [Azithromycin] Shortness Of Breath, Itching and Other (See Comments)    TOTAL BODY ITCHING [EVEN SOLES OF FEET] WHEEZING    Tramadol Itching and Other (See Comments)    Has taken recently without any side effects.   Psyllium Nausea And Vomiting and Other (See Comments)    Metamucil. Sneezing       Outpatient Medications Prior to Visit  Medication Sig Dispense Refill   acetaminophen (TYLENOL) 500 MG tablet Take 500 mg by mouth daily as needed for moderate pain.     albuterol (VENTOLIN HFA) 108 (90 Base)  MCG/ACT inhaler Inhale 2 puffs into the lungs every 6 (six) hours as needed for wheezing or shortness of breath. 8 g 6   ALPRAZolam (XANAX) 1 MG tablet TAKE 1 TABLET BY MOUTH AT  BEDTIME AS NEEDED FOR ANXIETY 30 tablet 3   amitriptyline (ELAVIL) 75 MG tablet Take 1 tablet (75 mg total) by mouth at bedtime. 90 tablet 3   BELBUCA 300 MCG FILM Place 300 mcg inside cheek every 12 (twelve) hours. 60 each 2   Carboxymethylcellul-Glycerin (LUBRICATING EYE DROPS OP) Place 1 drop into the right eye in the morning, at noon, and at bedtime.     cyanocobalamin (VITAMIN B12) 500 MCG tablet Take 1 tablet (500 mcg total) by mouth daily. 30 tablet 0   diphenoxylate-atropine (LOMOTIL) 2.5-0.025 MG tablet Take 1 tablet by mouth 3 (three) times daily as needed for diarrhea or loose stools. 30 tablet 0   erythromycin ophthalmic ointment Place 1 Application into the right eye 3 (three) times daily. 3.5 g 1   ezetimibe (ZETIA) 10 MG tablet Take 1 tablet (10 mg total) by mouth daily. 90 tablet 1   fluticasone-salmeterol (ADVAIR) 250-50 MCG/ACT AEPB Inhale 1 puff into the lungs in the morning and at bedtime. 180 each 3   loperamide (IMODIUM) 2 MG capsule Take 1-2 capsules (2-4 mg total) by mouth 4 (four) times daily as needed for diarrhea or loose stools. 30 capsule 1   methocarbamol (ROBAXIN) 500 MG tablet Take 1 tablet (500 mg total) by mouth every 6 (six) hours as needed for muscle spasms. 40 tablet 2   midodrine (PROAMATINE) 10 MG tablet Take 1 tablet (10 mg total) by mouth 3 (three) times daily as needed. 270 tablet 2   Multiple Vitamin (MULTIVITAMIN WITH MINERALS) TABS tablet Take 1 tablet by mouth daily.     omeprazole (PRILOSEC OTC) 20 MG tablet Take 20 mg by mouth daily.     PRESCRIPTION MEDICATION CPAP- At bedtime     triamcinolone cream (KENALOG) 0.1 % Apply 1 Application topically 2 (two) times daily. 30 g 0   VERZENIO 100 MG tablet TAKE 1 TABLET BY MOUTH TWICE DAILY 56 tablet 0   Vitamin D, Ergocalciferol,  (DRISDOL) 1.25 MG (50000 UNIT) CAPS capsule Take 50,000 Units by mouth every 7 (seven) days. Monday     White Petrolatum-Mineral Oil (LUBRICANT EYE NIGHTTIME) OINT Place 1 drop into the left eye at bedtime as needed (dryness).     XIIDRA 5 % SOLN Place 1 drop into the left eye 2 (two) times daily.     No facility-administered medications prior to visit.    Review of Systems  Constitutional:  Negative for chills, diaphoresis, fever, malaise/fatigue and weight loss.  HENT:  Negative for congestion.   Respiratory:  Positive for shortness of breath. Negative for cough, hemoptysis, sputum production and wheezing.   Cardiovascular:  Negative for chest pain, palpitations and leg swelling.  Objective:   Vitals:   12/14/22 1009  BP: 120/60  Pulse: 83  Temp: 98.7 F (37.1 C)  TempSrc: Oral  SpO2: 98%  Weight: 196 lb 6.4 oz (89.1 kg)  Height: 5\' 8"  (1.727 m)  SpO2: 98 % O2 Device: None (Room air)  Physical Exam: General: Well-appearing, no acute distress HENT: [place], AT, s/p cleft palate repair Eyes: EOMI, no scleral icterus Respiratory: Clear to auscultation bilaterally.  No crackles, wheezing or rales Cardiovascular: RRR, -M/R/G, no JVD Extremities:-Edema,-tenderness Neuro: AAO x4, CNII-XII grossly intact Psych: Normal mood, normal affect  Data Reviewed:  Imaging: CTA 10/30/19 - No pulmonary embolism. RUL 10x24mm, 5mm, unchanged. RLL nodule 4mm new CT Chest 05/21/20 - No evidence of ILD. 1.4 x 1.0 x 0.8 cm aggressive appearing nodule near the apex of the right upper lobe PET 08/07/20 - 8 mm irregular nodule in right apex improved compared to CT 2020 PET 05/13/22 - 05/13/22 - Stable 8 mm nodule in the RUL, stable LUL 3mm. CT Chest 07/26/22 - Bilateral pulmonary infiltates R>L  PFT: 12/18/19 FVC 3.13 (83%) FEV1 2.49 (92%) Ratio 88  TLC 78% DLCO 58% Interpretation: Mild restrictive defect with moderate reduction in gas exchange  02/17/20 Negative methacholine  challenge  04/30/21 FVC 2.64 (85%) FEV1 2.32 (94%) Ratio 89  TLC 80% DLCO 59% Interpretation: Isolated moderate reduction in gas exchange  Sleep: Sleep 07/14/21 - Mild OSA AHI 6.2. Nadir SpO2 81%  Echocardiogram: 09/2019 Normal EF, grade I DD, mild MR.  06/03/21 EF 60-65%. Normal diastolics. No valvular or WMA  Coronary CT 06/17/21 - Calcium score of 0  CBC    Component Value Date/Time   WBC 4.1 11/25/2022 0752   WBC 4.8 08/08/2022 1421   RBC 3.44 (L) 11/25/2022 0752   HGB 11.8 (L) 11/25/2022 0752   HGB 12.5 05/06/2022 0958   HCT 36.0 11/25/2022 0752   HCT 37.2 05/06/2022 0958   PLT 232 11/25/2022 0752   PLT 288 05/06/2022 0958   MCV 104.7 (H) 11/25/2022 0752   MCV 105 (H) 05/06/2022 0958   MCH 34.3 (H) 11/25/2022 0752   MCHC 32.8 11/25/2022 0752   RDW 14.0 11/25/2022 0752   RDW 13.8 05/06/2022 0958   LYMPHSABS 2.3 11/25/2022 0752   LYMPHSABS 3.2 (H) 07/28/2021 0916   MONOABS 0.3 11/25/2022 0752   EOSABS 0.1 11/25/2022 0752   EOSABS 0.1 07/28/2021 0916   BASOSABS 0.0 11/25/2022 0752   BASOSABS 0.0 07/28/2021 0916   Absolute eos  01/22/21 - 300 05/03/21 - 0 06/01/21 - 0 07/28/21 -100 04/22/22 -0  CPAP compliance per phone app 98% with 8 hours 47 min AHI 0.9     Assessment & Plan:   Discussion: 57 year old female former smoker (6 pack-years) with metastatic breast cancer s/p right lumpectomy with pulmonary mets on Verzenio and Faslodex, HTN, chronic respiratory failure, CKD III who presents for follow-up. Discussed clinical course and management of COPD/asthma including bronchodilator regimen, preventive care and action plan for exacerbation.  Breo - d/c'd due to ineffectiveness  Shortness of breath - prior covid infection Deconditioning --Prior methacholine challenge which was negative for asthma --CONTINUE Advair 250-50 mcg ONE puff TWICE a day. REFILL --CONTINUE Albuterol AS NEEDED for shortness of breath --Use nebulizers AS NEEDED for  exacerbations --Graduated Pulmonary Rehab in 06/2022-08/2022 --Continue regular aerobic exercise daily up to 20-30 minutes  Nocturnal hypoxemia 2/2 OSA Isolated moderate DLCO --Echocardiogram neg --Low suspicion for PH. If above treatment neg, could consider RHC but the diagnosis is less  likely in absence of hypoxemia --Wear oxygen for goal SpO2 >88%  OSA Patient uses NIV for more than four hours nightly for at least 70% of nights during the last three months of usage. The patient has been using and benefiting from PAP use and will continue to benefit from therapy.  --Obtain CPAP compliance report --RESEND CPAP supplies (head straps, mask etc) to correct DME: [employer] --Counseled on sleep hygiene --Counseled on weight loss/maintenance of healthy weight --Counseled NOT to drive if/when sleepy --Advised patient to wear CPAP for at least 4 hours each night for greater than 70% of the time to avoid the machine being repossessed by insurance.   Health Maintenance Immunization History  Administered Date(s) Administered   Influenza Inj Mdck Quad Pf 04/25/2019   Influenza,inj,Quad PF,6+ Mos 02/24/2020, 04/15/2022   Influenza,inj,quad, With Preservative 04/25/2019   Influenza-Unspecified 05/26/2021   Moderna Sars-Covid-2 Vaccination 06/29/2019, 08/03/2019, 06/24/2020   PFIZER(Purple Top)SARS-COV-2 Vaccination 05/26/2021   Pneumococcal Conjugate-13 05/29/2019   Pneumococcal Polysaccharide-23 07/29/2019   Zoster Recombinant(Shingrix) 02/17/2020, 04/20/2020   CT Lung Screen - not qualified  Orders Placed This Encounter  Procedures   AMB REFERRAL FOR DME    Referral Priority:   Routine    Referral Type:   Durable Medical Equipment Purchase   No orders of the defined types were placed in this encounter.  Return in about 6 months (around 06/16/2023).   I have spent a total time of 31-minutes on the day of the appointment including chart review, data review, collecting history,  coordinating care and discussing medical diagnosis and plan with the patient/family. Past medical history, allergies, medications were reviewed. Pertinent imaging, labs and tests included in this note have been reviewed and interpreted independently by me.  Shagun Wordell Mechele Collin, MD [name] 12/14/2022 10:30 AM  Office Number [phone]

## 2022-12-20 ENCOUNTER — Encounter (HOSPITAL_BASED_OUTPATIENT_CLINIC_OR_DEPARTMENT_OTHER): Payer: Self-pay | Admitting: Pulmonary Disease

## 2022-12-22 ENCOUNTER — Other Ambulatory Visit: Payer: Self-pay

## 2022-12-22 DIAGNOSIS — C50919 Malignant neoplasm of unspecified site of unspecified female breast: Secondary | ICD-10-CM

## 2022-12-23 ENCOUNTER — Ambulatory Visit: Payer: Medicare Other

## 2022-12-23 ENCOUNTER — Ambulatory Visit: Payer: Medicare Other | Admitting: Hematology

## 2022-12-23 ENCOUNTER — Other Ambulatory Visit: Payer: Medicare Other

## 2022-12-23 ENCOUNTER — Inpatient Hospital Stay: Payer: Medicare Other | Attending: Hematology

## 2022-12-23 ENCOUNTER — Inpatient Hospital Stay: Payer: Medicare Other

## 2022-12-23 DIAGNOSIS — Z5111 Encounter for antineoplastic chemotherapy: Secondary | ICD-10-CM | POA: Insufficient documentation

## 2022-12-23 DIAGNOSIS — C7800 Secondary malignant neoplasm of unspecified lung: Secondary | ICD-10-CM | POA: Insufficient documentation

## 2022-12-23 DIAGNOSIS — Z170 Estrogen receptor positive status [ER+]: Secondary | ICD-10-CM | POA: Insufficient documentation

## 2022-12-23 DIAGNOSIS — C50511 Malignant neoplasm of lower-outer quadrant of right female breast: Secondary | ICD-10-CM | POA: Insufficient documentation

## 2022-12-23 DIAGNOSIS — Z79818 Long term (current) use of other agents affecting estrogen receptors and estrogen levels: Secondary | ICD-10-CM | POA: Insufficient documentation

## 2022-12-25 DIAGNOSIS — R0600 Dyspnea, unspecified: Secondary | ICD-10-CM | POA: Diagnosis not present

## 2022-12-26 ENCOUNTER — Other Ambulatory Visit: Payer: Self-pay | Admitting: Hematology

## 2022-12-26 DIAGNOSIS — C50919 Malignant neoplasm of unspecified site of unspecified female breast: Secondary | ICD-10-CM

## 2022-12-27 ENCOUNTER — Telehealth: Payer: Self-pay | Admitting: Hematology

## 2022-12-27 ENCOUNTER — Encounter: Payer: Self-pay | Admitting: Hematology

## 2022-12-27 DIAGNOSIS — C50919 Malignant neoplasm of unspecified site of unspecified female breast: Secondary | ICD-10-CM | POA: Diagnosis not present

## 2022-12-27 DIAGNOSIS — N1831 Chronic kidney disease, stage 3a: Secondary | ICD-10-CM | POA: Diagnosis not present

## 2022-12-27 DIAGNOSIS — I951 Orthostatic hypotension: Secondary | ICD-10-CM | POA: Diagnosis not present

## 2022-12-27 DIAGNOSIS — G4733 Obstructive sleep apnea (adult) (pediatric): Secondary | ICD-10-CM | POA: Diagnosis not present

## 2022-12-27 NOTE — Telephone Encounter (Signed)
Refilled per Dr. Clyda Greener OV note - 09/23/22 Continues on [employer]

## 2022-12-28 ENCOUNTER — Inpatient Hospital Stay: Payer: Medicare Other

## 2022-12-28 ENCOUNTER — Other Ambulatory Visit: Payer: Self-pay

## 2022-12-28 VITALS — BP 125/53 | HR 74 | Temp 98.4°F | Resp 16

## 2022-12-28 DIAGNOSIS — Z170 Estrogen receptor positive status [ER+]: Secondary | ICD-10-CM | POA: Diagnosis not present

## 2022-12-28 DIAGNOSIS — Z5111 Encounter for antineoplastic chemotherapy: Secondary | ICD-10-CM | POA: Diagnosis not present

## 2022-12-28 DIAGNOSIS — C50919 Malignant neoplasm of unspecified site of unspecified female breast: Secondary | ICD-10-CM

## 2022-12-28 DIAGNOSIS — C50511 Malignant neoplasm of lower-outer quadrant of right female breast: Secondary | ICD-10-CM | POA: Diagnosis not present

## 2022-12-28 DIAGNOSIS — Z79818 Long term (current) use of other agents affecting estrogen receptors and estrogen levels: Secondary | ICD-10-CM | POA: Diagnosis not present

## 2022-12-28 DIAGNOSIS — C7800 Secondary malignant neoplasm of unspecified lung: Secondary | ICD-10-CM | POA: Diagnosis not present

## 2022-12-28 LAB — CMP (CANCER CENTER ONLY)
ALT: 47 U/L — ABNORMAL HIGH (ref 0–44)
AST: 65 U/L — ABNORMAL HIGH (ref 15–41)
Albumin: 3.6 g/dL (ref 3.5–5.0)
Alkaline Phosphatase: 267 U/L — ABNORMAL HIGH (ref 38–126)
Anion gap: 6 (ref 5–15)
BUN: 13 mg/dL (ref 6–20)
CO2: 33 mmol/L — ABNORMAL HIGH (ref 22–32)
Calcium: 9.3 mg/dL (ref 8.9–10.3)
Chloride: 103 mmol/L (ref 98–111)
Creatinine: 1.24 mg/dL — ABNORMAL HIGH (ref 0.44–1.00)
GFR, Estimated: 50 mL/min — ABNORMAL LOW (ref >60)
Glucose, Bld: 80 mg/dL (ref 70–99)
Potassium: 3.5 mmol/L (ref 3.5–5.1)
Sodium: 142 mmol/L (ref 135–145)
Total Bilirubin: 0.6 mg/dL (ref 0.3–1.2)
Total Protein: 6.7 g/dL (ref 6.5–8.1)

## 2022-12-28 LAB — CBC WITH DIFFERENTIAL (CANCER CENTER ONLY)
Abs Immature Granulocytes: 0 10*3/uL (ref 0.00–0.07)
Basophils Absolute: 0 10*3/uL (ref 0.0–0.1)
Basophils Relative: 1 %
Eosinophils Absolute: 0 10*3/uL (ref 0.0–0.5)
Eosinophils Relative: 1 %
HCT: 31.6 % — ABNORMAL LOW (ref 36.0–46.0)
Hemoglobin: 10.8 g/dL — ABNORMAL LOW (ref 12.0–15.0)
Immature Granulocytes: 0 %
Lymphocytes Relative: 48 %
Lymphs Abs: 2 10*3/uL (ref 0.7–4.0)
MCH: 35.4 pg — ABNORMAL HIGH (ref 26.0–34.0)
MCHC: 34.2 g/dL (ref 30.0–36.0)
MCV: 103.6 fL — ABNORMAL HIGH (ref 80.0–100.0)
Monocytes Absolute: 0.3 10*3/uL (ref 0.1–1.0)
Monocytes Relative: 7 %
Neutro Abs: 1.7 10*3/uL (ref 1.7–7.7)
Neutrophils Relative %: 43 %
Platelet Count: 191 10*3/uL (ref 150–400)
RBC: 3.05 MIL/uL — ABNORMAL LOW (ref 3.87–5.11)
RDW: 14.3 % (ref 11.5–15.5)
WBC Count: 4 10*3/uL (ref 4.0–10.5)
nRBC: 0 % (ref 0.0–0.2)

## 2022-12-28 MED ORDER — FULVESTRANT 250 MG/5ML IM SOSY
500.0000 mg | PREFILLED_SYRINGE | Freq: Once | INTRAMUSCULAR | Status: AC
  Administered 2022-12-28: 500 mg via INTRAMUSCULAR
  Filled 2022-12-28: qty 10

## 2022-12-31 DIAGNOSIS — R0600 Dyspnea, unspecified: Secondary | ICD-10-CM | POA: Diagnosis not present

## 2023-01-02 ENCOUNTER — Encounter: Payer: Self-pay | Admitting: Nephrology

## 2023-01-05 ENCOUNTER — Telehealth: Payer: Self-pay | Admitting: Pharmacist Clinician (PhC)/ Clinical Pharmacy Specialist

## 2023-01-05 ENCOUNTER — Encounter: Payer: Medicare Other | Admitting: Student in an Organized Health Care Education/Training Program

## 2023-01-05 DIAGNOSIS — E782 Mixed hyperlipidemia: Secondary | ICD-10-CM

## 2023-01-05 NOTE — Telephone Encounter (Signed)
-----   Message from [facility] sent at 11/11/2022 12:16 PM EDT ----- Regarding: lipid labs Check lipids (LDL, trigs) on atorva 10 and ezetimibe

## 2023-01-10 ENCOUNTER — Ambulatory Visit
Payer: Medicare Other | Attending: Student in an Organized Health Care Education/Training Program | Admitting: Student in an Organized Health Care Education/Training Program

## 2023-01-10 ENCOUNTER — Encounter: Payer: Self-pay | Admitting: Student in an Organized Health Care Education/Training Program

## 2023-01-10 VITALS — BP 100/62 | HR 92 | Temp 97.2°F | Resp 16 | Ht 67.0 in | Wt 200.0 lb

## 2023-01-10 DIAGNOSIS — M4310 Spondylolisthesis, site unspecified: Secondary | ICD-10-CM | POA: Insufficient documentation

## 2023-01-10 DIAGNOSIS — G894 Chronic pain syndrome: Secondary | ICD-10-CM | POA: Diagnosis not present

## 2023-01-10 DIAGNOSIS — M47817 Spondylosis without myelopathy or radiculopathy, lumbosacral region: Secondary | ICD-10-CM | POA: Diagnosis not present

## 2023-01-10 DIAGNOSIS — M961 Postlaminectomy syndrome, not elsewhere classified: Secondary | ICD-10-CM | POA: Insufficient documentation

## 2023-01-10 DIAGNOSIS — M47816 Spondylosis without myelopathy or radiculopathy, lumbar region: Secondary | ICD-10-CM | POA: Diagnosis not present

## 2023-01-10 MED ORDER — BELBUCA 300 MCG BU FILM
300.0000 ug | ORAL_FILM | Freq: Two times a day (BID) | BUCCAL | 3 refills | Status: DC
Start: 2023-02-12 — End: 2023-02-28

## 2023-01-10 NOTE — Progress Notes (Signed)
Nursing Pain Medication Assessment:  Safety precautions to be maintained throughout the outpatient stay will include: orient to surroundings, keep bed in low position, maintain call bell within reach at all times, provide assistance with transfer out of bed and ambulation.  Medication Inspection Compliance: [name] did not comply with our request to bring her pills to be counted. She was reminded that bringing the medication bottles, even when empty, is a requirement.  Medication: None brought in. Pill/Patch Count: None available to be counted. Bottle Appearance: No container available. Did not bring bottle(s) to appointment. Filled Date: N/A Last Medication intake:  Today

## 2023-01-10 NOTE — Progress Notes (Signed)
PROVIDER NOTE: Information contained herein reflects review and annotations entered in association with encounter. Interpretation of such information and data should be left to medically-trained personnel. Information provided to patient can be located elsewhere in the medical record under "Patient Instructions". Document created using STT-dictation technology, any transcriptional errors that may result from process are unintentional.    Patient: Brandy [name]  Service Category: E/M  Provider: Edward Jolly, MD  DOB: Feb 13, 1966  DOS: 01/10/2023  Referring Provider: Caesar Bookman, NP  MRN: [id number]  Specialty: Interventional Pain Management  PCP: Caesar Bookman, NP  Type: Established Patient  Setting: Ambulatory outpatient    Location: Office  Delivery: Face-to-face     HPI  Ms. BREIONA BUCHHOLZ [place], a 57 y.o. year old female, is here today because of her Lumbar facet joint syndrome [M47.816]. [name] primary complain today is Back Pain Last encounter: My last encounter with her was on 06/27/22 Pertinent problems: [name] has Spinal stenosis, lumbar region with neurogenic claudication; Post laminectomy syndrome; CKD (chronic kidney disease), stage III (HCC); Depression; Chronic pain syndrome; DDD (degenerative disc disease), lumbosacral; Grade 1 Retrolisthesis (L4-5, L5-S1); Lumbar facet syndrome (Multilevel) (Bilateral) (R>L); Neurogenic pain; and Spondylosis without myelopathy or radiculopathy, lumbosacral region on their pertinent problem list. Pain Assessment: Severity of Chronic pain is reported as a 0-No pain/10. Location: Back Lower, Right, Left (right side is worse)/to right hip and down right leg to calf. Onset: More than a month ago. Quality: Burning, Tightness, Tingling. Timing: Other (Comment) (pain gets worse as the day goes on). Modifying factor(s): stretching, meds. Vitals:  height is 5\' 7"  (1.702 m) and weight is 200 lb (90.7 kg).  Her temporal temperature is 97.2 F (36.2 C) (abnormal). Her blood pressure is 100/62 and her pulse is 92. Her respiration is 16 and oxygen saturation is 100%.   Reason for encounter: medication management.  No change in medical history since last visit.  Patient's pain is at baseline.  Patient continues multimodal pain regimen as prescribed.  States that it provides pain relief and improvement in functional status. Getting good relief from L-RFA done in December and January 2023, will continue to monitor.  ROS  Constitutional: Denies any fever or chills Gastrointestinal: No reported hemesis, hematochezia, vomiting, or acute GI distress Musculoskeletal:  Mild low back pain Neurological: No reported episodes of acute onset apraxia, aphasia, dysarthria, agnosia, amnesia, paralysis, loss of coordination, or loss of consciousness  Medication Review  ALPRAZolam, Buprenorphine HCl, Lifitegrast, Lubricant Eye Nighttime, PRESCRIPTION MEDICATION, Polyethyl Glycol-Propyl Glycol, Vitamin D (Ergocalciferol), abemaciclib, acetaminophen, albuterol, amitriptyline, cyanocobalamin, diphenoxylate-atropine, ezetimibe, fluticasone-salmeterol, loperamide, methocarbamol, midodrine, multivitamin with minerals, omeprazole, rosuvastatin, and triamcinolone cream  History Review  Allergy: [name] is allergic to zithromax [azithromycin], tramadol, and psyllium. Drug: [name]  reports no history of drug use. Alcohol:  reports no history of alcohol use. Tobacco:  reports that she quit smoking about 4 years ago. Her smoking use included cigarettes. She started smoking about 29 years ago. She has a 6.3 pack-year smoking history. She has never used smokeless tobacco. Social: [name]  reports that she quit smoking about 4 years ago. Her smoking use included cigarettes. She started smoking about 29 years ago. She has a 6.3 pack-year smoking history. She has never used smokeless  tobacco. She reports that she does not drink alcohol and does not use drugs. Medical:  has a past medical history of Acute pansinusitis (08/02/2017), Anxiety, Arthritis, ASD (atrial septal defect),  Cancer [facility]), Cataract, Chronic pain, CKD (chronic kidney disease), Dyspnea, Fatty liver (09/04/2019), GERD (gastroesophageal reflux disease), Heart murmur, History of hiatal hernia, Hyperlipidemia, Legally blind in right eye, as defined in [place], Lumbar herniated disc, Migraines, On home oxygen therapy (06/15/2022), OSA on CPAP (06/15/2022), PONV (postoperative nausea and vomiting), and Sciatica. Surgical: [name]  has a past surgical history that includes Breast surgery (Bilateral, 2011); Cataract extraction; Bunionectomy; Cleft palate repair; Cardiac surgery; Ablation; transthoracic echocardiogram; Cardiac catheterization; Lumbar laminectomy/decompression microdiscectomy (Left, 05/23/2016); Lumbar laminectomy/decompression microdiscectomy (Left, 05/23/2016); Shoulder injection (Left, 05/23/2016); Reduction mammaplasty (2011); Eye surgery (Right, 2019); Tubal ligation; and Breast lumpectomy with radioactive seed and sentinel lymph node biopsy (Right, 11/23/2020). Family: family history includes Arthritis in her mother; Cancer in her father; Diabetes in her mother; High blood pressure in her mother; Hypertension in her mother.  Laboratory Chemistry Profile   Renal Lab Results  Component Value Date   BUN 13 12/28/2022   CREATININE 1.24 (H) 12/28/2022   LABCREA 123.4 10/27/2021   BCR 8 08/08/2022   GFRAA >60 02/24/2020   GFRNONAA 50 (L) 12/28/2022    Hepatic Lab Results  Component Value Date   AST 65 (H) 12/28/2022   ALT 47 (H) 12/28/2022   ALBUMIN 3.6 12/28/2022   ALKPHOS 267 (H) 12/28/2022   LIPASE 35 07/12/2021    Electrolytes Lab Results  Component Value Date   NA 142 12/28/2022   K 3.5 12/28/2022   CL 103 12/28/2022   CALCIUM 9.3 12/28/2022   MG 1.2 (L) 08/08/2022   PHOS  2.6 04/26/2022    Bone Lab Results  Component Value Date   VD25OH 30.51 06/26/2019   VD125OH2TOT 62 05/28/2021   NG2952WU1 <8 05/28/2021   VD2125OH2 62 05/28/2021   25OHVITD1 50 11/27/2019   25OHVITD2 46 11/27/2019   25OHVITD3 4.1 11/27/2019    Inflammation (CRP: Acute Phase) (ESR: Chronic Phase) Lab Results  Component Value Date   CRP 5.6 (H) 07/28/2022   ESRSEDRATE 22 11/27/2019   LATICACIDVEN 1.5 07/23/2022         Note: Above Lab results reviewed.  Recent Imaging Review  XR Lumbar Spine Complete Please see Notes tab for imaging impression.  MR Lumbar Spine w/o contrast   Narrative CLINICAL DATA:  Spinal stenosis, lumbar. Low back pain radiating to bilateral sides/legs with numbness and weakness in bilateral legs for years. History of lumbar spine laminectomy in 2017.   EXAM: MRI LUMBAR SPINE WITHOUT CONTRAST   TECHNIQUE: Multiplanar, multisequence MR imaging of the lumbar spine was performed. No intravenous contrast was administered.   COMPARISON:  MR lumbar 01/13/2017; X-ray lumbar 08/26/2021.   FINDINGS: Segmentation:  5 lumbar vertebra   Alignment: 4 mm anterolisthesis L2-3 is unchanged. 2 mm anterolisthesis L3-4 unchanged. 4 mm retrolisthesis L4-5 and L5-S1 also unchanged.   Vertebrae: Negative for fracture or mass. Normal appearing bone marrow.   Conus medullaris and cauda equina: Conus extends to the L2-3 level. Conus and cauda equina appear normal.   Paraspinal and other soft tissues: Negative for paraspinous mass, adenopathy, fluid collection   Disc levels:   L1-2: Minimal disc degeneration. Moderate to advanced facet degeneration. Interval development of posterior epidural synovial cyst measuring 7 x 8 mm. This flattens the posterior thecal sac but is not causing significant spinal stenosis. Generous size spinal canal.   L2-3: Anterolisthesis. Moderate to advanced facet degeneration bilaterally. No significant stenosis   L3-4: Mild  anterolisthesis. Shallow right foraminal disc protrusion unchanged from the prior study. Mild disc degeneration  and moderate facet degeneration. Negative for stenosis.   L4-5: Left laminotomy. Diffuse bulging of the disc and mild facet degeneration. Mild left subarticular stenosis, unchanged. Spinal canal adequate in size   L5-S1: Left laminotomy. Retrolisthesis with disc degeneration and spurring. Bilateral facet degeneration. Mild subarticular stenosis bilaterally. No interval change.   IMPRESSION: Postop laminectomy left L4-5 and L5-S1. No recurrent disc protrusion. Subarticular stenosis left L4-5 unchanged. Subarticular stenosis bilaterally L5-S1 unchanged   Multilevel facet degeneration. Interval development of 7 x 8 mm synovial cyst at L1-2 projecting into the posterior epidural space in the midline. This is not causing significant spinal stenosis.     Electronically Signed By: Marlan Palau M.D. On: 09/29/2021 10:49   MR Lumbar Spine W Wo Contrast   Narrative CLINICAL DATA:  Intermittent back pain for 5 years, bilateral lower extremity burning and numbness. Follow-up radiculopathy.   EXAM: MRI LUMBAR SPINE WITHOUT AND WITH CONTRAST   TECHNIQUE: Multiplanar and multiecho pulse sequences of the lumbar spine were obtained without and with intravenous contrast.   CONTRAST:  17mL MULTIHANCE GADOBENATE DIMEGLUMINE 529 MG/ML IV SOLN   COMPARISON:  MRI of the lumbar spine March 06, 2016 and lumbar spine radiographs August 25, 2016   FINDINGS: SEGMENTATION: For the purposes of this report, the last well-formed intervertebral disc will be reported as L5-S1.   ALIGNMENT: Straightened lumbar lordosis. Minimal grade 1 L2-3 anterolisthesis. Minimal grade 1 L4-5 retrolisthesis. Grade 1 L5-S1 retrolisthesis.   VERTEBRAE:Vertebral bodies are intact. Moderate to severe L5-S1 disc height loss, progressed from prior MRI. Mild desiccation lower lumbar discs. Mild chronic  discogenic endplate changes L5-S1. Similar low T1, bright STIR signal single segment upper coccyx, without definite enhancement though, not tailored for evaluation. No abnormal or acute lumbar spine bone marrow signal or enhancement. No abnormal disc enhancement.   CONUS MEDULLARIS: Conus medullaris terminates at L1-2 and demonstrates normal morphology and signal characteristics. Cauda equina is normal. No abnormal cord, leptomeningeal or epidural enhancement.   PARASPINAL AND SOFT TISSUES: Included prevertebral and paraspinal soft tissues are nonacute. Moderate LEFT sacral paraspinal denervation/atrophy.   DISC LEVELS:   T12-L1, L1-2: No disc bulge, canal stenosis nor neural foraminal narrowing. Mild to moderate facet arthropathy.   L2-3: Anterolisthesis. Unroofing of the disc without disc bulge. 4 mm RIGHT facet synovial cyst decreased from 9 mm, decreased fluid component. Moderate to severe RIGHT, moderate LEFT facet arthropathy and ligamentum flavum redundancy without canal stenosis or neural foraminal narrowing.   L3-4: Small broad-based RIGHT extraforaminal disc protrusion unchanged. Moderate to severe facet arthropathy without canal stenosis or neural foraminal narrowing.   L4-5: Retrolisthesis. LEFT hemilaminectomy. Homogeneous enhancing granulation tissue within the surgical bed, no fluid collection. Small LEFT subarticular disc protrusion and annular fissure persists. No canal stenosis. Mild LEFT neural foraminal narrowing.   L5-S1: Bilateral L5-S1 laminectomies. Homogeneous enhancing granulation tissue within the surgical bed, no focal fluid collection. Mild facet arthropathy. No canal stenosis. Mild LEFT neural foraminal narrowing.   IMPRESSION: 1. Status post LEFT L4-5 and bilateral L5-S1 laminectomies. 2. Grade 1 L2-3 anterolisthesis, grade 1 L4-5 retrolisthesis, grade 1 L5-S1 retrolisthesis. 3. No canal stenosis. Mild LEFT L4-5 and L5-S1 neural  foraminal narrowing. 4. Similar abnormal upper coccyx bone marrow signal, nonspecific and incompletely characterized.     Electronically Signed By: Awilda Metro M.D. On: 01/14/2017 00:23   DG Lumbar Spine 1 View   Narrative CLINICAL DATA:  Left L4-5 decompression.   EXAM: LUMBAR SPINE - 1 VIEW   COMPARISON:  05/23/2016  intraoperative lumbar spine radiographs .   FINDINGS: There is a posterior approach surgical marking device with the tip overlying the spinal canal at the level of the L5-S1 disc. Additional surgical instruments overlie the posterior lower back soft tissues at the L4-S1 levels.   IMPRESSION: Posterior approach surgical marking device position as described.     Electronically Signed By: Delbert Phenix M.D. On: 05/23/2016 10:57  Note: Reviewed        Physical Exam  General appearance: Well nourished, well developed, and well hydrated. In no apparent acute distress Mental status: Alert, oriented x 3 (person, place, & time)       Respiratory: No evidence of acute respiratory distress Eyes: PERLA Vitals: BP 100/62   Pulse 92   Temp (!) 97.2 F (36.2 C) (Temporal)   Resp 16   Ht 5\' 7"  (1.702 m)   Wt 200 lb (90.7 kg)   SpO2 100%   BMI 31.32 kg/m  BMI: Estimated body mass index is 31.32 kg/m as calculated from the following:   Height as of this encounter: 5\' 7"  (1.702 m).   Weight as of this encounter: 200 lb (90.7 kg). Ideal: Ideal body weight: 61.6 kg (135 lb 12.9 oz) Adjusted ideal body weight: 73.2 kg (161 lb 7.7 oz)  Lumbar Spine Area Exam  Skin & Axial Inspection: Well healed scar from previous spine surgery detected Alignment: Symmetrical Functional ROM: Pain restricted ROM       Stability: No instability detected Muscle Tone/Strength: Functionally intact. No obvious neuro-muscular anomalies detected. Sensory (Neurological): facet mediated Palpation: No palpable anomalies         Mild pain with lumbar extension and facet loading.    Ambulation: Unassisted Gait: Antalgic Posture: Difficulty standing up straight, due to pain  Lower Extremity Exam      Side: Right lower extremity   Side: Left lower extremity  Stability: No instability observed           Stability: No instability observed          Skin & Extremity Inspection: Skin color, temperature, and hair growth are WNL. No peripheral edema or cyanosis. No masses, redness, swelling, asymmetry, or associated skin lesions. No contractures.   Skin & Extremity Inspection: Skin color, temperature, and hair growth are WNL. No peripheral edema or cyanosis. No masses, redness, swelling, asymmetry, or associated skin lesions. No contractures.  Functional ROM: Pain restricted ROM for hip and knee joints           Functional ROM: Pain restricted ROM for hip and knee joints          Muscle Tone/Strength: Functionally intact. No obvious neuro-muscular anomalies detected.   Muscle Tone/Strength: Functionally intact. No obvious neuro-muscular anomalies detected.  Sensory (Neurological): Unimpaired         Sensory (Neurological): Unimpaired        DTR: Patellar: deferred today Achilles: deferred today Plantar: deferred today   DTR: Patellar: deferred today Achilles: deferred today Plantar: deferred today  Palpation: No palpable anomalies   Palpation: No palpable anomalies     Assessment   Diagnosis Status  1. Lumbar facet syndrome (Multilevel) (Bilateral) (R>L)   2. Chronic pain syndrome   3. Spondylosis without myelopathy or radiculopathy, lumbosacral region   4. Grade 1 Retrolisthesis (L4-5, L5-S1)   5. Grade 1 Anterolisthesis (L2-3, L3-4)   6. Post laminectomy syndrome     Responding Controlled Controlled     Plan of Care   Requested Prescriptions   Signed Prescriptions  Disp Refills   BELBUCA 300 MCG FILM 60 each 3    Sig: Place 300 mcg inside cheek every 12 (twelve) hours.     Follow-up plan:   Return in about 4 months (around 05/25/2023) for Medication  Management, in person.     Interventional management options:  Considering:   SCS trial Sprint PNS medial branch Diagnostic caudal ESI  Diagnostic left sacroiliac joint block  Diagnostic right IA hip joint injection      Recent Visits No visits were found meeting these conditions. Showing recent visits within past 90 days and meeting all other requirements Today's Visits Date Type Provider Dept  01/10/23 Office Visit Edward Jolly, MD [facility]  Showing today's visits and meeting all other requirements Future Appointments No visits were found meeting these conditions. Showing future appointments within next 90 days and meeting all other requirements  I discussed the assessment and treatment plan with the patient. The patient was provided an opportunity to ask questions and all were answered. The patient agreed with the plan and demonstrated an understanding of the instructions.  Patient advised to call back or seek an in-person evaluation if the symptoms or condition worsens.  Duration of encounter: .  Total time on encounter, as per AMA guidelines included both the face-to-face and non-face-to-face time personally spent by the physician and/or other qualified health care professional(s) on the day of the encounter (includes time in activities that require the physician or other qualified health care professional and does not include time in activities normally performed by clinical staff). Physician's time may include the following activities when performed: preparing to see the patient (eg, review of tests, pre-charting review of records) obtaining and/or reviewing separately obtained history performing a medically appropriate examination and/or evaluation counseling and educating the patient/family/caregiver ordering medications, tests, or procedures referring and communicating with other health care professionals (when not separately reported) documenting  clinical information in the electronic or other health record independently interpreting results (not separately reported) and communicating results to the patient/ family/caregiver care coordination (not separately reported)  Note by: Edward Jolly, MD Date: 01/10/2023; Time: 9:19 AM

## 2023-01-18 ENCOUNTER — Other Ambulatory Visit: Payer: Self-pay | Admitting: Family

## 2023-01-19 ENCOUNTER — Other Ambulatory Visit: Payer: Self-pay | Admitting: Hematology

## 2023-01-19 DIAGNOSIS — C50919 Malignant neoplasm of unspecified site of unspecified female breast: Secondary | ICD-10-CM

## 2023-01-25 DIAGNOSIS — R0600 Dyspnea, unspecified: Secondary | ICD-10-CM | POA: Diagnosis not present

## 2023-01-26 ENCOUNTER — Other Ambulatory Visit: Payer: Self-pay

## 2023-01-26 DIAGNOSIS — C50919 Malignant neoplasm of unspecified site of unspecified female breast: Secondary | ICD-10-CM

## 2023-01-27 ENCOUNTER — Inpatient Hospital Stay: Payer: Medicare Other | Attending: Hematology

## 2023-01-27 ENCOUNTER — Inpatient Hospital Stay: Payer: Medicare Other

## 2023-01-27 ENCOUNTER — Inpatient Hospital Stay (HOSPITAL_BASED_OUTPATIENT_CLINIC_OR_DEPARTMENT_OTHER): Payer: Medicare Other | Admitting: Hematology

## 2023-01-27 VITALS — BP 136/81 | HR 74 | Temp 98.4°F | Resp 16 | Ht 67.0 in | Wt 199.8 lb

## 2023-01-27 VITALS — BP 145/61 | HR 72 | Temp 97.7°F | Resp 16

## 2023-01-27 DIAGNOSIS — C50511 Malignant neoplasm of lower-outer quadrant of right female breast: Secondary | ICD-10-CM | POA: Insufficient documentation

## 2023-01-27 DIAGNOSIS — Z79899 Other long term (current) drug therapy: Secondary | ICD-10-CM | POA: Diagnosis not present

## 2023-01-27 DIAGNOSIS — C50919 Malignant neoplasm of unspecified site of unspecified female breast: Secondary | ICD-10-CM

## 2023-01-27 DIAGNOSIS — Z170 Estrogen receptor positive status [ER+]: Secondary | ICD-10-CM | POA: Diagnosis not present

## 2023-01-27 DIAGNOSIS — C7800 Secondary malignant neoplasm of unspecified lung: Secondary | ICD-10-CM | POA: Insufficient documentation

## 2023-01-27 DIAGNOSIS — E876 Hypokalemia: Secondary | ICD-10-CM | POA: Insufficient documentation

## 2023-01-27 DIAGNOSIS — Z79818 Long term (current) use of other agents affecting estrogen receptors and estrogen levels: Secondary | ICD-10-CM | POA: Insufficient documentation

## 2023-01-27 DIAGNOSIS — Z87891 Personal history of nicotine dependence: Secondary | ICD-10-CM | POA: Diagnosis not present

## 2023-01-27 DIAGNOSIS — Z5111 Encounter for antineoplastic chemotherapy: Secondary | ICD-10-CM | POA: Diagnosis present

## 2023-01-27 LAB — CMP (CANCER CENTER ONLY)
ALT: 61 U/L — ABNORMAL HIGH (ref 0–44)
AST: 90 U/L — ABNORMAL HIGH (ref 15–41)
Albumin: 3.8 g/dL (ref 3.5–5.0)
Alkaline Phosphatase: 334 U/L — ABNORMAL HIGH (ref 38–126)
Anion gap: 8 (ref 5–15)
BUN: 12 mg/dL (ref 6–20)
CO2: 29 mmol/L (ref 22–32)
Calcium: 9.1 mg/dL (ref 8.9–10.3)
Chloride: 105 mmol/L (ref 98–111)
Creatinine: 1.34 mg/dL — ABNORMAL HIGH (ref 0.44–1.00)
GFR, Estimated: 46 mL/min — ABNORMAL LOW (ref >60)
Glucose, Bld: 88 mg/dL (ref 70–99)
Potassium: 3.2 mmol/L — ABNORMAL LOW (ref 3.5–5.1)
Sodium: 142 mmol/L (ref 135–145)
Total Bilirubin: 0.9 mg/dL (ref 0.3–1.2)
Total Protein: 7.4 g/dL (ref 6.5–8.1)

## 2023-01-27 LAB — CBC WITH DIFFERENTIAL (CANCER CENTER ONLY)
Abs Immature Granulocytes: 0.01 10*3/uL (ref 0.00–0.07)
Basophils Absolute: 0 10*3/uL (ref 0.0–0.1)
Basophils Relative: 1 %
Eosinophils Absolute: 0.1 10*3/uL (ref 0.0–0.5)
Eosinophils Relative: 2 %
HCT: 33.3 % — ABNORMAL LOW (ref 36.0–46.0)
Hemoglobin: 11.2 g/dL — ABNORMAL LOW (ref 12.0–15.0)
Immature Granulocytes: 0 %
Lymphocytes Relative: 58 %
Lymphs Abs: 2.4 10*3/uL (ref 0.7–4.0)
MCH: 35 pg — ABNORMAL HIGH (ref 26.0–34.0)
MCHC: 33.6 g/dL (ref 30.0–36.0)
MCV: 104.1 fL — ABNORMAL HIGH (ref 80.0–100.0)
Monocytes Absolute: 0.2 10*3/uL (ref 0.1–1.0)
Monocytes Relative: 5 %
Neutro Abs: 1.4 10*3/uL — ABNORMAL LOW (ref 1.7–7.7)
Neutrophils Relative %: 34 %
Platelet Count: 173 10*3/uL (ref 150–400)
RBC: 3.2 MIL/uL — ABNORMAL LOW (ref 3.87–5.11)
RDW: 14.6 % (ref 11.5–15.5)
WBC Count: 4.1 10*3/uL (ref 4.0–10.5)
nRBC: 0 % (ref 0.0–0.2)

## 2023-01-27 MED ORDER — FULVESTRANT 250 MG/5ML IM SOSY
500.0000 mg | PREFILLED_SYRINGE | Freq: Once | INTRAMUSCULAR | Status: AC
  Administered 2023-01-27: 500 mg via INTRAMUSCULAR
  Filled 2023-01-27: qty 10
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
## 2023-01-31 DIAGNOSIS — R0600 Dyspnea, unspecified: Secondary | ICD-10-CM | POA: Diagnosis not present

## 2023-02-02 ENCOUNTER — Encounter: Payer: Self-pay | Admitting: Hematology

## 2023-02-07 ENCOUNTER — Telehealth: Payer: Self-pay | Admitting: Pharmacist Clinician (PhC)/ Clinical Pharmacy Specialist

## 2023-02-07 NOTE — Telephone Encounter (Signed)
Patient called, she recently had labs drawn at the cancer center.  Showed an increase in AST/ALT and oncology would like her to come off statin.    Reviewed option of repatha and patient agreeable to try.  Because her LFT's are currently elevated, will hold off starting Repatha until next labs are drawn in 3-4 weeks at cancer center.  Once we have that baseline LFT information, can start.  Patient agreeable to plan.   CPhT -  please try PA for Repatha.

## 2023-02-14 ENCOUNTER — Other Ambulatory Visit: Payer: Self-pay | Admitting: Hematology

## 2023-02-14 DIAGNOSIS — C50919 Malignant neoplasm of unspecified site of unspecified female breast: Secondary | ICD-10-CM

## 2023-02-20 ENCOUNTER — Encounter: Payer: Self-pay | Admitting: Student in an Organized Health Care Education/Training Program

## 2023-02-22 ENCOUNTER — Other Ambulatory Visit: Payer: Self-pay

## 2023-02-22 DIAGNOSIS — C50919 Malignant neoplasm of unspecified site of unspecified female breast: Secondary | ICD-10-CM

## 2023-02-24 ENCOUNTER — Inpatient Hospital Stay: Payer: Medicare Other | Attending: Hematology

## 2023-02-24 ENCOUNTER — Inpatient Hospital Stay: Payer: Medicare Other

## 2023-02-24 VITALS — BP 128/72 | HR 85 | Temp 98.6°F | Resp 16

## 2023-02-24 DIAGNOSIS — Z79818 Long term (current) use of other agents affecting estrogen receptors and estrogen levels: Secondary | ICD-10-CM | POA: Insufficient documentation

## 2023-02-24 DIAGNOSIS — C50511 Malignant neoplasm of lower-outer quadrant of right female breast: Secondary | ICD-10-CM | POA: Insufficient documentation

## 2023-02-24 DIAGNOSIS — Z87891 Personal history of nicotine dependence: Secondary | ICD-10-CM | POA: Diagnosis not present

## 2023-02-24 DIAGNOSIS — C50919 Malignant neoplasm of unspecified site of unspecified female breast: Secondary | ICD-10-CM

## 2023-02-24 DIAGNOSIS — Z79899 Other long term (current) drug therapy: Secondary | ICD-10-CM | POA: Insufficient documentation

## 2023-02-24 DIAGNOSIS — Z5111 Encounter for antineoplastic chemotherapy: Secondary | ICD-10-CM | POA: Diagnosis not present

## 2023-02-24 DIAGNOSIS — Z170 Estrogen receptor positive status [ER+]: Secondary | ICD-10-CM | POA: Insufficient documentation

## 2023-02-24 DIAGNOSIS — E876 Hypokalemia: Secondary | ICD-10-CM | POA: Insufficient documentation

## 2023-02-24 DIAGNOSIS — C7800 Secondary malignant neoplasm of unspecified lung: Secondary | ICD-10-CM | POA: Insufficient documentation

## 2023-02-24 LAB — CMP (CANCER CENTER ONLY)
ALT: 37 U/L (ref 0–44)
AST: 46 U/L — ABNORMAL HIGH (ref 15–41)
Albumin: 3.4 g/dL — ABNORMAL LOW (ref 3.5–5.0)
Alkaline Phosphatase: 201 U/L — ABNORMAL HIGH (ref 38–126)
Anion gap: 7 (ref 5–15)
BUN: 12 mg/dL (ref 6–20)
CO2: 32 mmol/L (ref 22–32)
Calcium: 8.6 mg/dL — ABNORMAL LOW (ref 8.9–10.3)
Chloride: 102 mmol/L (ref 98–111)
Creatinine: 1.35 mg/dL — ABNORMAL HIGH (ref 0.44–1.00)
GFR, Estimated: 46 mL/min — ABNORMAL LOW (ref >60)
Glucose, Bld: 115 mg/dL — ABNORMAL HIGH (ref 70–99)
Potassium: 3.5 mmol/L (ref 3.5–5.1)
Sodium: 141 mmol/L (ref 135–145)
Total Bilirubin: 1.5 mg/dL — ABNORMAL HIGH (ref 0.3–1.2)
Total Protein: 6.9 g/dL (ref 6.5–8.1)

## 2023-02-24 LAB — CBC WITH DIFFERENTIAL (CANCER CENTER ONLY)
Basophils Absolute: 0 10*3/uL (ref 0.0–0.1)
Basophils Relative: 1 %
Eosinophils Absolute: 0.1 10*3/uL (ref 0.0–0.5)
Eosinophils Relative: 2 %
HCT: 32.6 % — ABNORMAL LOW (ref 36.0–46.0)
Hemoglobin: 10.8 g/dL — ABNORMAL LOW (ref 12.0–15.0)
Lymphocytes Relative: 56 %
Lymphs Abs: 1.8 10*3/uL (ref 0.7–4.0)
MCH: 34.8 pg — ABNORMAL HIGH (ref 26.0–34.0)
MCHC: 33.1 g/dL (ref 30.0–36.0)
MCV: 105.2 fL — ABNORMAL HIGH (ref 80.0–100.0)
Monocytes Absolute: 0.4 10*3/uL (ref 0.1–1.0)
Monocytes Relative: 11 %
Neutro Abs: 1 10*3/uL — ABNORMAL LOW (ref 1.7–7.7)
Neutrophils Relative %: 30 %
Platelet Count: 172 10*3/uL (ref 150–400)
RBC: 3.1 MIL/uL — ABNORMAL LOW (ref 3.87–5.11)
RDW: 14.4 % (ref 11.5–15.5)
Smear Review: NORMAL
WBC Count: 3.2 10*3/uL — ABNORMAL LOW (ref 4.0–10.5)
nRBC: 0 % (ref 0.0–0.2)
nRBC: 0 /100 WBC

## 2023-02-24 MED ORDER — FULVESTRANT 250 MG/5ML IM SOSY
500.0000 mg | PREFILLED_SYRINGE | Freq: Once | INTRAMUSCULAR | Status: AC
  Administered 2023-02-24: 500 mg via INTRAMUSCULAR
  Filled 2023-02-24: qty 10

## 2023-02-28 ENCOUNTER — Encounter: Payer: Self-pay | Admitting: Student in an Organized Health Care Education/Training Program

## 2023-02-28 ENCOUNTER — Ambulatory Visit
Payer: Medicare Other | Attending: Student in an Organized Health Care Education/Training Program | Admitting: Student in an Organized Health Care Education/Training Program

## 2023-02-28 VITALS — BP 136/63 | HR 99 | Temp 97.1°F | Ht 67.0 in | Wt 200.0 lb

## 2023-02-28 DIAGNOSIS — M4310 Spondylolisthesis, site unspecified: Secondary | ICD-10-CM | POA: Diagnosis not present

## 2023-02-28 DIAGNOSIS — M961 Postlaminectomy syndrome, not elsewhere classified: Secondary | ICD-10-CM | POA: Diagnosis not present

## 2023-02-28 DIAGNOSIS — M47817 Spondylosis without myelopathy or radiculopathy, lumbosacral region: Secondary | ICD-10-CM | POA: Insufficient documentation

## 2023-02-28 DIAGNOSIS — M47816 Spondylosis without myelopathy or radiculopathy, lumbar region: Secondary | ICD-10-CM | POA: Diagnosis not present

## 2023-02-28 DIAGNOSIS — G894 Chronic pain syndrome: Secondary | ICD-10-CM | POA: Insufficient documentation

## 2023-02-28 MED ORDER — HYDROCODONE-ACETAMINOPHEN 5-325 MG PO TABS: 1.0000 | ORAL_TABLET | Freq: Two times a day (BID) | ORAL | 0 refills | Status: AC | PRN

## 2023-02-28 NOTE — Progress Notes (Signed)
PROVIDER NOTE: Information contained herein reflects review and annotations entered in association with encounter. Interpretation of such information and data should be left to medically-trained personnel. Information provided to patient can be located elsewhere in the medical record under "Patient Instructions". Document created using STT-dictation technology, any transcriptional errors that may result from process are unintentional.    Patient: Brandy [name]  Service Category: E/M  Provider: Edward Jolly, MD  DOB: 08-Mar-1966  DOS: 02/28/2023  Referring Provider: Caesar Bookman, NP  MRN: [id number]  Specialty: Interventional Pain Management  PCP: Caesar Bookman, NP  Type: Established Patient  Setting: Ambulatory outpatient    Location: Office  Delivery: Face-to-face     HPI  Brandy [name], a 57 y.o. year old female, is here today because of her Chronic pain syndrome [G89.4]. Brandy [name] primary complain today is Back Pain (lower) Last encounter: My last encounter with her was on 01/10/23 Pertinent problems: [name] has Spinal stenosis, lumbar region with neurogenic claudication; Post laminectomy syndrome; CKD (chronic kidney disease), stage III (HCC); Depression; Chronic pain syndrome; DDD (degenerative disc disease), lumbosacral; Grade 1 Retrolisthesis (L4-5, L5-S1); Lumbar facet syndrome (Multilevel) (Bilateral) (R>L); Neurogenic pain; and Spondylosis without myelopathy or radiculopathy, lumbosacral region on their pertinent problem list. Pain Assessment: Severity of Chronic pain is reported as a 5 /10. Location: Back Lower/radiates down to right hip. Onset: More than a month ago. Quality: Burning. Timing: Intermittent. Modifying factor(s): rest. Vitals:  height is 5\' 7"  (1.702 m) and weight is 200 lb (90.7 kg). Her temperature is 97.1 F (36.2 C) (abnormal). Her blood pressure is 136/63 and her pulse is 99. Her oxygen saturation is  100%.   Reason for encounter: medication management.  Patient is still experiencing good relief from her lumbar RFA for her low back pain.  She states that she was obtaining satisfactory analgesic benefit with [facility] however the cost for her has increased by over $120 per month that she is no longer able to afford.  We discussed alternatives including hydrocodone.  Risk and benefits reviewed and we will try that for chronic pain management.   ROS  Constitutional: Denies any fever or chills Gastrointestinal: No reported hemesis, hematochezia, vomiting, or acute GI distress Musculoskeletal:  Mild low back pain Neurological: No reported episodes of acute onset apraxia, aphasia, dysarthria, agnosia, amnesia, paralysis, loss of coordination, or loss of consciousness  Medication Review  ALPRAZolam, Buprenorphine HCl, HYDROcodone-acetaminophen, Lifitegrast, Lubricant Eye Nighttime, PRESCRIPTION MEDICATION, Polyethyl Glycol-Propyl Glycol, Vitamin D (Ergocalciferol), abemaciclib, acetaminophen, albuterol, amitriptyline, cyanocobalamin, diphenoxylate-atropine, ezetimibe, fluticasone-salmeterol, ketoconazole, loperamide, methocarbamol, midodrine, multivitamin with minerals, omeprazole, rosuvastatin, and triamcinolone cream  History Review  Allergy: Brandy [name] is allergic to zithromax [azithromycin], tramadol, and psyllium. Drug: Brandy [name]  reports no history of drug use. Alcohol:  reports no history of alcohol use. Tobacco:  reports that she quit smoking about 5 years ago. Her smoking use included cigarettes. She started smoking about 30 years ago. She has a 6.3 pack-year smoking history. She has never used smokeless tobacco. Social: Brandy [name]  reports that she quit smoking about 5 years ago. Her smoking use included cigarettes. She started smoking about 30 years ago. She has a 6.3 pack-year smoking history. She has never used smokeless tobacco. She reports that she  does not drink alcohol and does not use drugs. Medical:  has a past medical history of Acute pansinusitis (08/02/2017), Anxiety, Arthritis, ASD (atrial septal defect), Cancer (HCC), Cataract, Chronic pain, CKD (  chronic kidney disease), Dyspnea, Fatty liver (09/04/2019), GERD (gastroesophageal reflux disease), Heart murmur, History of hiatal hernia, Hyperlipidemia, Legally blind in right eye, as defined in [place], Lumbar herniated disc, Migraines, On home oxygen therapy (06/15/2022), OSA on CPAP (06/15/2022), PONV (postoperative nausea and vomiting), and Sciatica. Surgical: Brandy [name]  has a past surgical history that includes Breast surgery (Bilateral, 2011); Cataract extraction; Bunionectomy; Cleft palate repair; Cardiac surgery; Ablation; transthoracic echocardiogram; Cardiac catheterization; Lumbar laminectomy/decompression microdiscectomy (Left, 05/23/2016); Lumbar laminectomy/decompression microdiscectomy (Left, 05/23/2016); Shoulder injection (Left, 05/23/2016); Reduction mammaplasty (2011); Eye surgery (Right, 2019); Tubal ligation; and Breast lumpectomy with radioactive seed and sentinel lymph node biopsy (Right, 11/23/2020). Family: family history includes Arthritis in her mother; Cancer in her father; Diabetes in her mother; High blood pressure in her mother; Hypertension in her mother.  Laboratory Chemistry Profile   Renal Lab Results  Component Value Date   BUN 12 02/24/2023   CREATININE 1.35 (H) 02/24/2023   LABCREA 123.4 10/27/2021   BCR 8 08/08/2022   GFRAA >60 02/24/2020   GFRNONAA 46 (L) 02/24/2023    Hepatic Lab Results  Component Value Date   AST 46 (H) 02/24/2023   ALT 37 02/24/2023   ALBUMIN 3.4 (L) 02/24/2023   ALKPHOS 201 (H) 02/24/2023   LIPASE 35 07/12/2021    Electrolytes Lab Results  Component Value Date   NA 141 02/24/2023   K 3.5 02/24/2023   CL 102 02/24/2023   CALCIUM 8.6 (L) 02/24/2023   MG 1.2 (L) 08/08/2022   PHOS 2.6 04/26/2022     Bone Lab Results  Component Value Date   VD25OH 30.51 06/26/2019   VD125OH2TOT 62 05/28/2021   [id number] <8 05/28/2021   VD2125OH2 62 05/28/2021   25OHVITD1 50 11/27/2019   25OHVITD2 46 11/27/2019   25OHVITD3 4.1 11/27/2019    Inflammation (CRP: Acute Phase) (ESR: Chronic Phase) Lab Results  Component Value Date   CRP 5.6 (H) 07/28/2022   ESRSEDRATE 22 11/27/2019   LATICACIDVEN 1.5 07/23/2022         Note: Above Lab results reviewed.  Recent Imaging Review  XR Lumbar Spine Complete Please see Notes tab for imaging impression.  MR Lumbar Spine w/o contrast   Narrative CLINICAL DATA:  Spinal stenosis, lumbar. Low back pain radiating to bilateral sides/legs with numbness and weakness in bilateral legs for years. History of lumbar spine laminectomy in 2017.   EXAM: MRI LUMBAR SPINE WITHOUT CONTRAST   TECHNIQUE: Multiplanar, multisequence MR imaging of the lumbar spine was performed. No intravenous contrast was administered.   COMPARISON:  MR lumbar 01/13/2017; X-ray lumbar 08/26/2021.   FINDINGS: Segmentation:  5 lumbar vertebra   Alignment: 4 mm anterolisthesis L2-3 is unchanged. 2 mm anterolisthesis L3-4 unchanged. 4 mm retrolisthesis L4-5 and L5-S1 also unchanged.   Vertebrae: Negative for fracture or mass. Normal appearing bone marrow.   Conus medullaris and cauda equina: Conus extends to the L2-3 level. Conus and cauda equina appear normal.   Paraspinal and other soft tissues: Negative for paraspinous mass, adenopathy, fluid collection   Disc levels:   L1-2: Minimal disc degeneration. Moderate to advanced facet degeneration. Interval development of posterior epidural synovial cyst measuring 7 x 8 mm. This flattens the posterior thecal sac but is not causing significant spinal stenosis. Generous size spinal canal.   L2-3: Anterolisthesis. Moderate to advanced facet degeneration bilaterally. No significant stenosis   L3-4: Mild anterolisthesis.  Shallow right foraminal disc protrusion unchanged from the prior study. Mild disc degeneration and moderate facet degeneration. Negative  for stenosis.   L4-5: Left laminotomy. Diffuse bulging of the disc and mild facet degeneration. Mild left subarticular stenosis, unchanged. Spinal canal adequate in size   L5-S1: Left laminotomy. Retrolisthesis with disc degeneration and spurring. Bilateral facet degeneration. Mild subarticular stenosis bilaterally. No interval change.   IMPRESSION: Postop laminectomy left L4-5 and L5-S1. No recurrent disc protrusion. Subarticular stenosis left L4-5 unchanged. Subarticular stenosis bilaterally L5-S1 unchanged   Multilevel facet degeneration. Interval development of 7 x 8 mm synovial cyst at L1-2 projecting into the posterior epidural space in the midline. This is not causing significant spinal stenosis.     Electronically Signed By: Marlan Palau M.D. On: 09/29/2021 10:49   MR Lumbar Spine W Wo Contrast   Narrative CLINICAL DATA:  Intermittent back pain for 5 years, bilateral lower extremity burning and numbness. Follow-up radiculopathy.   EXAM: MRI LUMBAR SPINE WITHOUT AND WITH CONTRAST   TECHNIQUE: Multiplanar and multiecho pulse sequences of the lumbar spine were obtained without and with intravenous contrast.   CONTRAST:  17mL MULTIHANCE GADOBENATE DIMEGLUMINE 529 MG/ML IV SOLN   COMPARISON:  MRI of the lumbar spine March 06, 2016 and lumbar spine radiographs August 25, 2016   FINDINGS: SEGMENTATION: For the purposes of this report, the last well-formed intervertebral disc will be reported as L5-S1.   ALIGNMENT: Straightened lumbar lordosis. Minimal grade 1 L2-3 anterolisthesis. Minimal grade 1 L4-5 retrolisthesis. Grade 1 L5-S1 retrolisthesis.   VERTEBRAE:Vertebral bodies are intact. Moderate to severe L5-S1 disc height loss, progressed from prior MRI. Mild desiccation lower lumbar discs. Mild chronic discogenic endplate  changes N6-E9. Similar low T1, bright STIR signal single segment upper coccyx, without definite enhancement though, not tailored for evaluation. No abnormal or acute lumbar spine bone marrow signal or enhancement. No abnormal disc enhancement.   CONUS MEDULLARIS: Conus medullaris terminates at L1-2 and demonstrates normal morphology and signal characteristics. Cauda equina is normal. No abnormal cord, leptomeningeal or epidural enhancement.   PARASPINAL AND SOFT TISSUES: Included prevertebral and paraspinal soft tissues are nonacute. Moderate LEFT sacral paraspinal denervation/atrophy.   DISC LEVELS:   T12-L1, L1-2: No disc bulge, canal stenosis nor neural foraminal narrowing. Mild to moderate facet arthropathy.   L2-3: Anterolisthesis. Unroofing of the disc without disc bulge. 4 mm RIGHT facet synovial cyst decreased from 9 mm, decreased fluid component. Moderate to severe RIGHT, moderate LEFT facet arthropathy and ligamentum flavum redundancy without canal stenosis or neural foraminal narrowing.   L3-4: Small broad-based RIGHT extraforaminal disc protrusion unchanged. Moderate to severe facet arthropathy without canal stenosis or neural foraminal narrowing.   L4-5: Retrolisthesis. LEFT hemilaminectomy. Homogeneous enhancing granulation tissue within the surgical bed, no fluid collection. Small LEFT subarticular disc protrusion and annular fissure persists. No canal stenosis. Mild LEFT neural foraminal narrowing.   L5-S1: Bilateral L5-S1 laminectomies. Homogeneous enhancing granulation tissue within the surgical bed, no focal fluid collection. Mild facet arthropathy. No canal stenosis. Mild LEFT neural foraminal narrowing.   IMPRESSION: 1. Status post LEFT L4-5 and bilateral L5-S1 laminectomies. 2. Grade 1 L2-3 anterolisthesis, grade 1 L4-5 retrolisthesis, grade 1 L5-S1 retrolisthesis. 3. No canal stenosis. Mild LEFT L4-5 and L5-S1 neural foraminal narrowing. 4. Similar  abnormal upper coccyx bone marrow signal, nonspecific and incompletely characterized.     Electronically Signed By: Awilda Metro M.D. On: 01/14/2017 00:23   DG Lumbar Spine 1 View   Narrative CLINICAL DATA:  Left L4-5 decompression.   EXAM: LUMBAR SPINE - 1 VIEW   COMPARISON:  05/23/2016 intraoperative lumbar spine radiographs .  FINDINGS: There is a posterior approach surgical marking device with the tip overlying the spinal canal at the level of the L5-S1 disc. Additional surgical instruments overlie the posterior lower back soft tissues at the L4-S1 levels.   IMPRESSION: Posterior approach surgical marking device position as described.     Electronically Signed By: Delbert Phenix M.D. On: 05/23/2016 10:57  Note: Reviewed        Physical Exam  General appearance: Well nourished, well developed, and well hydrated. In no apparent acute distress Mental status: Alert, oriented x 3 (person, place, & time)       Respiratory: No evidence of acute respiratory distress Eyes: PERLA Vitals: BP 136/63   Pulse 99   Temp (!) 97.1 F (36.2 C)   Ht 5\' 7"  (1.702 m)   Wt 200 lb (90.7 kg)   SpO2 100%   BMI 31.32 kg/m  BMI: Estimated body mass index is 31.32 kg/m as calculated from the following:   Height as of this encounter: 5\' 7"  (1.702 m).   Weight as of this encounter: 200 lb (90.7 kg). Ideal: Ideal body weight: 61.6 kg (135 lb 12.9 oz) Adjusted ideal body weight: 73.2 kg (161 lb 7.7 oz)  Lumbar Spine Area Exam  Skin & Axial Inspection: Well healed scar from previous spine surgery detected Alignment: Symmetrical Functional ROM: Pain restricted ROM       Stability: No instability detected Muscle Tone/Strength: Functionally intact. No obvious neuro-muscular anomalies detected. Sensory (Neurological): facet mediated Palpation: No palpable anomalies         Mild pain with lumbar extension and facet loading.   Ambulation: Unassisted Gait: Antalgic Posture:  Difficulty standing up straight, due to pain  Lower Extremity Exam      Side: Right lower extremity   Side: Left lower extremity  Stability: No instability observed           Stability: No instability observed          Skin & Extremity Inspection: Skin color, temperature, and hair growth are WNL. No peripheral edema or cyanosis. No masses, redness, swelling, asymmetry, or associated skin lesions. No contractures.   Skin & Extremity Inspection: Skin color, temperature, and hair growth are WNL. No peripheral edema or cyanosis. No masses, redness, swelling, asymmetry, or associated skin lesions. No contractures.  Functional ROM: Pain restricted ROM for hip and knee joints           Functional ROM: Pain restricted ROM for hip and knee joints          Muscle Tone/Strength: Functionally intact. No obvious neuro-muscular anomalies detected.   Muscle Tone/Strength: Functionally intact. No obvious neuro-muscular anomalies detected.  Sensory (Neurological): Unimpaired         Sensory (Neurological): Unimpaired        DTR: Patellar: deferred today Achilles: deferred today Plantar: deferred today   DTR: Patellar: deferred today Achilles: deferred today Plantar: deferred today  Palpation: No palpable anomalies   Palpation: No palpable anomalies     Assessment   Diagnosis Status  1. Chronic pain syndrome   2. Lumbar facet syndrome (Multilevel) (Bilateral) (R>L)   3. Spondylosis without myelopathy or radiculopathy, lumbosacral region   4. Grade 1 Retrolisthesis (L4-5, L5-S1)   5. Grade 1 Anterolisthesis (L2-3, L3-4)   6. Post laminectomy syndrome   7. Lumbar facet hypertrophy (Multilevel) (Bilateral)     Controlled Controlled Controlled     Plan of Care   Requested Prescriptions   Signed Prescriptions Disp Refills  HYDROcodone-acetaminophen (NORCO/VICODIN) 5-325 MG tablet 60 tablet 0    Sig: Take 1 tablet by mouth every 12 (twelve) hours as needed for severe pain. Must last 30 days.    HYDROcodone-acetaminophen (NORCO/VICODIN) 5-325 MG tablet 60 tablet 0    Sig: Take 1 tablet by mouth every 12 (twelve) hours as needed for severe pain. Must last 30 days.   HYDROcodone-acetaminophen (NORCO/VICODIN) 5-325 MG tablet 60 tablet 0    Sig: Take 1 tablet by mouth every 12 (twelve) hours as needed for severe pain. Must last 30 days.     Follow-up plan:   Return in about 3 months (around 05/30/2023) for MM, F2F.     Interventional management options:  Considering:   SCS trial Sprint PNS medial branch Diagnostic caudal ESI  Diagnostic left sacroiliac joint block  Diagnostic right IA hip joint injection      Recent Visits Date Type Provider Dept  01/10/23 Office Visit Edward Jolly, MD [facility]  Showing recent visits within past 90 days and meeting all other requirements Today's Visits Date Type Provider Dept  02/28/23 Office Visit Edward Jolly, MD [facility]  Showing today's visits and meeting all other requirements Future Appointments Date Type Provider Dept  05/25/23 Appointment Edward Jolly, MD [facility]  Showing future appointments within next 90 days and meeting all other requirements  I discussed the assessment and treatment plan with the patient. The patient was provided an opportunity to ask questions and all were answered. The patient agreed with the plan and demonstrated an understanding of the instructions.  Patient advised to call back or seek an in-person evaluation if the symptoms or condition worsens.  Duration of encounter: .  Total time on encounter, as per AMA guidelines included both the face-to-face and non-face-to-face time personally spent by the physician and/or other qualified health care professional(s) on the day of the encounter (includes time in activities that require the physician or other qualified health care professional and does not include time in activities normally performed by clinical  staff). Physician's time may include the following activities when performed: preparing to see the patient (eg, review of tests, pre-charting review of records) obtaining and/or reviewing separately obtained history performing a medically appropriate examination and/or evaluation counseling and educating the patient/family/caregiver ordering medications, tests, or procedures referring and communicating with other health care professionals (when not separately reported) documenting clinical information in the electronic or other health record independently interpreting results (not separately reported) and communicating results to the patient/ family/caregiver care coordination (not separately reported)  Note by: Edward Jolly, MD Date: 02/28/2023; Time: 10:31 AM

## 2023-02-28 NOTE — Progress Notes (Signed)
Safety precautions to be maintained throughout the outpatient stay will include: orient to surroundings, keep bed in low position, maintain call bell within reach at all times, provide assistance with transfer out of bed and ambulation.  

## 2023-03-01 ENCOUNTER — Telehealth: Payer: Self-pay | Admitting: Student in an Organized Health Care Education/Training Program

## 2023-03-01 NOTE — Telephone Encounter (Signed)
Patient needs PA for new med Dr Cherylann Ratel switched her too.

## 2023-03-01 NOTE — Telephone Encounter (Signed)
Attempted PA on Cover My Meds. No PA needed. Patient and pharmacy notified.

## 2023-03-03 ENCOUNTER — Encounter: Payer: Self-pay | Admitting: Hematology

## 2023-03-03 ENCOUNTER — Other Ambulatory Visit: Payer: Self-pay | Admitting: Pharmacist Clinician (PhC)/ Clinical Pharmacy Specialist

## 2023-03-03 ENCOUNTER — Telehealth: Payer: Self-pay | Admitting: Pharmacist Clinician (PhC)/ Clinical Pharmacy Specialist

## 2023-03-03 ENCOUNTER — Other Ambulatory Visit (HOSPITAL_COMMUNITY): Payer: Self-pay

## 2023-03-03 DIAGNOSIS — E782 Mixed hyperlipidemia: Secondary | ICD-10-CM

## 2023-03-03 NOTE — Telephone Encounter (Signed)
Please do PA for Repatha

## 2023-03-03 NOTE — Telephone Encounter (Signed)
Starting PA form and realizing patient will not meet approval criteria without a diagnosis of either ASCVD, Familial, or Primary Hyperlipidemia (LDL-C of 190 mg/dL or greater)  [facility] made aware, PA cancelled.

## 2023-03-03 NOTE — Telephone Encounter (Signed)
LMOM for patient.  Cannot get PCSK9 2/2 lack of ASCVD/familial hyperlipidemia.   Currently on ezetimibe 10.  Will get labs in 6-8 weeks then determine if we can re-try prior authorization.

## 2023-03-08 ENCOUNTER — Other Ambulatory Visit: Payer: Self-pay | Admitting: Hematology

## 2023-03-08 DIAGNOSIS — C50919 Malignant neoplasm of unspecified site of unspecified female breast: Secondary | ICD-10-CM

## 2023-03-15 ENCOUNTER — Encounter: Payer: Self-pay | Admitting: Family

## 2023-03-15 ENCOUNTER — Ambulatory Visit (INDEPENDENT_AMBULATORY_CARE_PROVIDER_SITE_OTHER): Payer: Medicare Other | Admitting: Family

## 2023-03-15 VITALS — BP 114/78 | HR 66 | Temp 95.7°F | Resp 18 | Ht 67.0 in | Wt 197.6 lb

## 2023-03-15 DIAGNOSIS — J449 Chronic obstructive pulmonary disease, unspecified: Secondary | ICD-10-CM | POA: Diagnosis not present

## 2023-03-15 DIAGNOSIS — K219 Gastro-esophageal reflux disease without esophagitis: Secondary | ICD-10-CM | POA: Diagnosis not present

## 2023-03-15 DIAGNOSIS — E782 Mixed hyperlipidemia: Secondary | ICD-10-CM | POA: Diagnosis not present

## 2023-03-15 DIAGNOSIS — E538 Deficiency of other specified B group vitamins: Secondary | ICD-10-CM

## 2023-03-15 DIAGNOSIS — I951 Orthostatic hypotension: Secondary | ICD-10-CM

## 2023-03-15 DIAGNOSIS — G43109 Migraine with aura, not intractable, without status migrainosus: Secondary | ICD-10-CM

## 2023-03-15 DIAGNOSIS — R252 Cramp and spasm: Secondary | ICD-10-CM | POA: Diagnosis not present

## 2023-03-15 DIAGNOSIS — F322 Major depressive disorder, single episode, severe without psychotic features: Secondary | ICD-10-CM

## 2023-03-15 DIAGNOSIS — I10 Essential (primary) hypertension: Secondary | ICD-10-CM | POA: Diagnosis not present

## 2023-03-15 MED ORDER — AMITRIPTYLINE HCL 75 MG PO TABS
75.0000 mg | ORAL_TABLET | Freq: Every day | ORAL | 3 refills | Status: DC
Start: 2023-03-15 — End: 2024-02-14

## 2023-03-15 MED ORDER — VITAMIN D (ERGOCALCIFEROL) 1.25 MG (50000 UNIT) PO CAPS: 50000.0000 [IU] | ORAL_CAPSULE | ORAL | 0 refills | Status: DC

## 2023-03-15 MED ORDER — MIDODRINE HCL 10 MG PO TABS: 10.0000 mg | ORAL_TABLET | Freq: Two times a day (BID) | ORAL | 1 refills | Status: DC

## 2023-03-15 NOTE — Progress Notes (Signed)
Provider: Richarda Blade FNP-C   Heavenleigh Petruzzi, Donalee Citrin, NP  Patient Care Team: Trimaine Maser, Donalee Citrin, NP as PCP - General (Family Medicine) Thomasene Ripple, DO as PCP - Cardiology (Cardiology) Elie Confer, NP as Nurse Practitioner  Extended Emergency Contact Information Primary Emergency Contact: [name],Charles Address: [address]          [place], [place] [address] D[place] of [place] Home Phone: [phone] Relation: Spouse Secondary Emergency Contact: [name],Michele Address: [address]          [place], [place] [address] Darden [place] of [place] Home Phone: [phone] Relation: Daughter  Code Status:  Full Code  Goals of care: Advanced Directive information    03/15/2023    8:16 AM  Advanced Directives  Does Patient Have a Medical Advance Directive? No  Would patient like information on creating a medical advance directive? No - Patient declined     Chief Complaint  Patient presents with   Medical Management of Chronic Issues    6 mths chronic issues.   Immunizations    Influenza, Covid, DTAP   Quality Metric Gaps    Cervical Cancer Screening.    HPI:  Pt is a 57 y.o. female seen today for medical management of chronic diseases. 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  Pain radiates down to right leg.  Continues to follow-up with pain management.  Last seen 02/28/2023 by Dr. Edward Jolly.   Recent lab work done 03/03/2023 by Nance Pew cardiology reviewed and discussed during visit. Hemoglobin 10.8 lower than previous which was 11.2.Her  MCV 105 states run out of her vitamin B 12 tablet and has been forgetting to buy from the store.   Glucose 115  Creatinine 1.35  at baseline previous 1.348 follows up with Nephrologist every 6 months.Midodrine was adjusted.   Total bilirubin 1.5; alkaline phosphatase 201 improved from 334 states has been high since she had cancer. Follows up with oncologist.   Also complains of both hands and feet cramping.Has some tingling every now then.she recently started taking her magnesium and potassium supplement which usually helps with the symptoms. States drinks all other fluids but not enough water.  Hypertension - B/p has been stable so she stopped checking.denies any headache,dizziness,vision changes,fatigue,chest tightness,palpitation,chest pain or shortness of breath.   Generalized anxiety disorder - continues to require Xanax which helps with symptoms.    Migraine - Wellbutrin has been effective.usually has vision blurry prior to migraine. Also associated with Nausea and vomiting.states vomiting alleviates the symptoms.last episode of the migraine was one month ago.   Had a fall few weeks after tripping over the foot of the bed.No injuries sustained.states has not been doing her video exercises.Feels stiff. Plan to restart her exercises and moving around more often.   Immunization - Had a flu,Tdap,PNA and Hep B vaccine at the pharmacy.CMA will obtain records then update chart.   Due for cervical cancer screening.she will schedule an appointment for pap smear.    Past Medical History:  Diagnosis Date   Acute pansinusitis 08/02/2017   Anxiety    Arthritis    ASD (atrial septal defect)    s/p closure with Amplatzer device 10/05/04 (Dr. Celso Amy, [facility]) 10/05/04   Cancer [facility])    Cataract    Chronic pain  CKD (chronic kidney disease)    Dyspnea    Fatty liver 09/04/2019   GERD (gastroesophageal reflux disease)    Heart murmur    no longer heard   History of hiatal hernia    Hyperlipidemia    Legally blind in right eye, as defined in [place]    Lumbar herniated disc    Migraines    On home oxygen therapy  06/15/2022   Pt was given O2 on D/C from [facility] 04/2022   OSA on CPAP 06/15/2022   PONV (postoperative nausea and vomiting)    Sciatica    Past Surgical History:  Procedure Laterality Date   ABLATION     BREAST LUMPECTOMY WITH RADIOACTIVE SEED AND SENTINEL LYMPH NODE BIOPSY Right 11/23/2020   Procedure: RIGHT BREAST LUMPECTOMY WITH RADIOACTIVE SEED AND RIGHT AXILLARY SENTINEL LYMPH NODE BIOPSY;  Surgeon: Emelia Loron, MD;  Location: [facility] OR;  Service: General;  Laterality: Right;   BREAST SURGERY Bilateral 2011   Breast Reduction Surgery   BUNIONECTOMY     CARDIAC CATHETERIZATION     10/05/04 [facility]): LM < 25%, otherwise normal coronaries. No pulmonary HTN, Mildly enlarged RV. Secundum ASD s/p closure.   CARDIAC SURGERY     CATARACT EXTRACTION     CLEFT PALATE REPAIR     s/p cleft lip and palate repair   EYE SURGERY Right 2019   right eye removed   LUMBAR LAMINECTOMY/DECOMPRESSION MICRODISCECTOMY Left 05/23/2016   Procedure: LEFT L4-L5 LATERAL RECESS DECOMPRESSION WITH CENTRAL AND RIGHT DECOMPRESSION VIA LEFT SIDE;  Surgeon: Kerrin Champagne, MD;  Location: [facility] OR;  Service: Orthopedics;  Laterality: Left;   LUMBAR LAMINECTOMY/DECOMPRESSION MICRODISCECTOMY Left 05/23/2016   Procedure: LUMBAR LAMINECTOMY/DECOMPRESSION MICRODISCECTOMY Lumbar five - Sacral One 1 LEVEL;  Surgeon: Kerrin Champagne, MD;  Location: [facility] OR;  Service: Orthopedics;  Laterality: Left;   REDUCTION MAMMAPLASTY  2011   SHOULDER INJECTION Left 05/23/2016   Procedure: SHOULDER INJECTION;  Surgeon: Kerrin Champagne, MD;  Location: [facility] OR;  Service: Orthopedics;  Laterality: Left;  band-aid per pa-c   TRANSTHORACIC ECHOCARDIOGRAM     12/15/05 [facility]): Mild LVH, EF > 55%, grade 1 diastolic dysfunction, Trivial MR/PR/TR.   TUBAL LIGATION      Allergies  Allergen Reactions   Zithromax [Azithromycin] Shortness Of Breath, Itching and Other (See Comments)    TOTAL BODY ITCHING [EVEN SOLES OF FEET] WHEEZING    Tramadol Itching and  Other (See Comments)    Has taken recently without any side effects.   Psyllium Nausea And Vomiting and Other (See Comments)    Metamucil. Sneezing      Allergies as of 03/15/2023       Reactions   Zithromax [azithromycin] Shortness Of Breath, Itching, Other (See Comments)   TOTAL BODY ITCHING [EVEN SOLES OF FEET] WHEEZING   Tramadol Itching, Other (See Comments)   Has taken recently without any side effects.   Psyllium Nausea And Vomiting, Other (See Comments)   Metamucil. Sneezing         Medication List        Accurate as of March 15, 2023  8:50 AM. If you have any questions, ask your nurse or doctor.          STOP taking these medications    rosuvastatin 10 MG tablet Commonly known as: CRESTOR Stopped by: Maddoxx Burkitt C Analeese Andreatta       TAKE these medications    acetaminophen 500 MG tablet Commonly known as: TYLENOL Take 500 mg by mouth  daily as needed for moderate pain.   albuterol 108 (90 Base) MCG/ACT inhaler Commonly known as: VENTOLIN HFA Inhale 2 puffs into the lungs every 6 (six) hours as needed for wheezing or shortness of breath.   ALPRAZolam 1 MG tablet Commonly known as: XANAX TAKE 1 TABLET BY MOUTH AT  BEDTIME AS NEEDED FOR ANXIETY   amitriptyline 75 MG tablet Commonly known as: ELAVIL Take 1 tablet (75 mg total) by mouth at bedtime.   cyanocobalamin 500 MCG tablet Commonly known as: VITAMIN B12 Take 1 tablet (500 mcg total) by mouth daily.   diphenoxylate-atropine 2.5-0.025 MG tablet Commonly known as: LOMOTIL Take 1 tablet by mouth 3 (three) times daily as needed for diarrhea or loose stools.   ezetimibe 10 MG tablet Commonly known as: ZETIA TAKE 1 TABLET BY MOUTH DAILY   fluticasone-salmeterol 250-50 MCG/ACT Aepb Commonly known as: ADVAIR Inhale 1 puff into the lungs in the morning and at bedtime.   HYDROcodone-acetaminophen 5-325 MG tablet Commonly known as: NORCO/VICODIN Take 1 tablet by mouth every 12 (twelve) hours as needed for  severe pain. Must last 30 days.   HYDROcodone-acetaminophen 5-325 MG tablet Commonly known as: NORCO/VICODIN Take 1 tablet by mouth every 12 (twelve) hours as needed for severe pain. Must last 30 days. Start taking on: March 30, 2023   HYDROcodone-acetaminophen 5-325 MG tablet Commonly known as: NORCO/VICODIN Take 1 tablet by mouth every 12 (twelve) hours as needed for severe pain. Must last 30 days. Start taking on: April 29, 2023   ketoconazole 2 % cream Commonly known as: NIZORAL Apply 1 Application topically 2 (two) times daily.   loperamide 2 MG capsule Commonly known as: IMODIUM Take 1-2 capsules (2-4 mg total) by mouth 4 (four) times daily as needed for diarrhea or loose stools.   Lubricant Eye Nighttime Oint Place 1 drop into the left eye at bedtime as needed (dryness).   LUBRICATING EYE DROPS OP Place 1 drop into the right eye in the morning, at noon, and at bedtime.   methocarbamol 500 MG tablet Commonly known as: ROBAXIN Take 1 tablet (500 mg total) by mouth every 6 (six) hours as needed for muscle spasms.   midodrine 10 MG tablet Commonly known as: PROAMATINE Take 1 tablet (10 mg total) by mouth 3 (three) times daily as needed.   multivitamin with minerals Tabs tablet Take 1 tablet by mouth daily.   omeprazole 20 MG tablet Commonly known as: PRILOSEC OTC Take 20 mg by mouth daily.   PRESCRIPTION MEDICATION CPAP- At bedtime   triamcinolone cream 0.1 % Commonly known as: KENALOG Apply 1 Application topically 2 (two) times daily.   Verzenio 100 MG tablet Generic drug: abemaciclib TAKE 1 TABLET BY MOUTH TWICE DAILY   Vitamin D (Ergocalciferol) 1.25 MG (50000 UNIT) Caps capsule Commonly known as: DRISDOL Take 50,000 Units by mouth every 7 (seven) days. Monday   Xiidra 5 % Soln Generic drug: Lifitegrast Place 1 drop into the left eye 2 (two) times daily.        Review of Systems  Constitutional:  Negative for appetite change, chills,  fatigue, fever and unexpected weight change.  HENT:  Negative for congestion, dental problem, ear discharge, ear pain, facial swelling, hearing loss, nosebleeds, postnasal drip, rhinorrhea, sinus pressure, sinus pain, sneezing, sore throat, tinnitus and trouble swallowing.   Eyes:  Positive for visual disturbance. Negative for pain, discharge, redness and itching.       Right eye blind   Respiratory:  Negative for cough,  chest tightness, shortness of breath and wheezing.   Cardiovascular:  Negative for chest pain, palpitations and leg swelling.  Gastrointestinal:  Negative for abdominal distention, abdominal pain, blood in stool, constipation, diarrhea, nausea and vomiting.  Endocrine: Negative for cold intolerance, heat intolerance, polydipsia, polyphagia and polyuria.  Genitourinary:  Negative for difficulty urinating, dysuria, flank pain, frequency and urgency.  Musculoskeletal:  Positive for arthralgias, gait problem and neck pain. Negative for back pain, joint swelling, myalgias and neck stiffness.  Skin:  Negative for color change, pallor, rash and wound.  Neurological:  Negative for dizziness, syncope, speech difficulty, weakness, light-headedness and numbness.       Chronic migraine headache last episode 1 month ago  Hematological:  Does not bruise/bleed easily.  Psychiatric/Behavioral:  Negative for agitation, behavioral problems, confusion, hallucinations, self-injury, sleep disturbance and suicidal ideas. The patient is not nervous/anxious.     Immunization History  Administered Date(s) Administered   Influenza Inj Mdck Quad Pf 04/25/2019   Influenza,inj,Quad PF,6+ Mos 02/24/2020, 04/15/2022   Influenza,inj,quad, With Preservative 04/25/2019   Influenza-Unspecified 05/26/2021   Moderna Sars-Covid-2 Vaccination 06/29/2019, 08/03/2019, 06/24/2020   PFIZER(Purple Top)SARS-COV-2 Vaccination 05/26/2021   Pneumococcal Conjugate-13 05/29/2019   Pneumococcal Polysaccharide-23 07/29/2019    Zoster Recombinant(Shingrix) 02/17/2020, 04/20/2020   Pertinent  Health Maintenance Due  Topic Date Due   INFLUENZA VACCINE  01/05/2023   MAMMOGRAM  09/13/2024   Colonoscopy  08/16/2027      09/27/2022   10:59 AM 10/06/2022    1:24 PM 01/10/2023    9:01 AM 02/28/2023    9:49 AM 03/15/2023    8:44 AM  Fall Risk  Falls in the past year? 0 0 1 1 1   Was there an injury with Fall? 0  0 0 0  Fall Risk Category Calculator 0  1 1 2   Patient at Risk for Falls Due to No Fall Risks No Fall Risks   History of fall(s)  Fall risk Follow up Falls evaluation completed Falls evaluation completed   Falls evaluation completed   Functional Status Survey:    Vitals:   03/15/23 0816  BP: 114/78  Pulse: 66  Resp: 18  Temp: (!) 95.7 F (35.4 C)  SpO2: 99%  Weight: 197 lb 9.6 oz (89.6 kg)  Height: 5\' 7"  (1.702 m)   Body mass index is 30.95 kg/m. Physical Exam Vitals reviewed.  Constitutional:      General: She is not in acute distress.    Appearance: Normal appearance. She is obese. She is not ill-appearing or diaphoretic.  HENT:     Head: Normocephalic.     Right Ear: Tympanic membrane, ear canal and external ear normal. There is no impacted cerumen.     Left Ear: Tympanic membrane, ear canal and external ear normal. There is no impacted cerumen.     Nose: Nose normal. No congestion or rhinorrhea.     Mouth/Throat:     Mouth: Mucous membranes are moist.     Pharynx: Oropharynx is clear. No oropharyngeal exudate or posterior oropharyngeal erythema.  Eyes:     General: No scleral icterus.       Right eye: No discharge.     Extraocular Movements: Extraocular movements intact.     Conjunctiva/sclera: Conjunctivae normal.     Comments: Right eye pupil Round and reactive to light. 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  Cardiovascular:     Rate and Rhythm: Normal rate and regular rhythm.  Pulses: Normal pulses.     Heart sounds: Normal heart sounds. No  murmur heard.    No friction rub. No gallop.  Pulmonary:     Effort: Pulmonary effort is normal. No respiratory distress.     Breath sounds: Normal breath sounds. No wheezing, rhonchi or rales.  Chest:     Chest wall: No tenderness.  Abdominal:     General: Bowel sounds are normal. There is no distension.     Palpations: Abdomen is soft. There is no mass.     Tenderness: There is no abdominal tenderness. There is no right CVA tenderness, left CVA tenderness, guarding or rebound.  Musculoskeletal:        General: No swelling or tenderness.     Cervical back: Normal range of motion. No rigidity or tenderness.     Lumbar back: No swelling. Normal range of motion. Positive left straight leg raise test.     Right lower leg: No edema.     Left lower leg: No edema.     Comments: Unable to raise RLE due to chronic pain   Lymphadenopathy:     Cervical: No cervical adenopathy.  Skin:    General: Skin is warm and dry.     Coloration: Skin is not pale.     Findings: No bruising, erythema, lesion or rash.  Neurological:     Mental Status: She is alert and oriented to person, place, and time.     Cranial Nerves: No cranial nerve deficit.     Sensory: No sensory deficit.     Motor: No weakness.     Coordination: Coordination normal.     Gait: Gait abnormal.  Psychiatric:        Mood and Affect: Mood normal.        Speech: Speech normal.        Behavior: Behavior normal.        Thought Content: Thought content normal.        Judgment: Judgment normal.     Labs reviewed: Recent Labs    04/24/22 1545 04/25/22 0844 04/26/22 0518 05/06/22 0958 07/26/22 1206 07/27/22 0257 07/28/22 0249 08/08/22 1421 08/19/22 0719 12/28/22 1239 01/27/23 0725 02/24/23 0755  NA 140 143 140   < >  --  136   < > 141   < > 142 142 141  K 2.0* 3.1* 3.1*   < >  --  3.9   < > 4.2   < > 3.5 3.2* 3.5  CL 91* 97* 97*   < >  --  99   < > 99   < > 103 105 102  CO2 37* 37* 35*   < >  --  25   < > 30   < > 33*  29 32  GLUCOSE 118* 100* 104*   < >  --  228*   < > 82   < > 80 88 115*  BUN 10 8 8    < >  --  21*   < > 10   < > 13 12 12   CREATININE 1.54* 1.27* 1.12*   < >  --  0.98   < > 1.28*   < > 1.24* 1.34* 1.35*  CALCIUM 8.0* 7.7* 7.9*   < >  --  9.4   < > 9.5   < > 9.3 9.1 8.6*  MG 1.3* 1.5* 1.8   < > 1.5* 2.0  --  1.2*  --   --   --   --  PHOS 1.9* 1.7* 2.6  --   --   --   --   --   --   --   --   --    < > = values in this interval not displayed.   Recent Labs    12/28/22 1239 01/27/23 0725 02/24/23 0755  AST 65* 90* 46*  ALT 47* 61* 37  ALKPHOS 267* 334* 201*  BILITOT 0.6 0.9 1.5*  PROT 6.7 7.4 6.9  ALBUMIN 3.6 3.8 3.4*   Recent Labs    12/28/22 1239 01/27/23 0725 02/24/23 0755  WBC 4.0 4.1 3.2*  NEUTROABS 1.7 1.4* 1.0*  HGB 10.8* 11.2* 10.8*  HCT 31.6* 33.3* 32.6*  MCV 103.6* 104.1* 105.2*  PLT 191 173 172   Lab Results  Component Value Date   TSH 2.74 09/13/2022   Lab Results  Component Value Date   HGBA1C 5.3 07/26/2022   Lab Results  Component Value Date   CHOL 299 (H) 09/13/2022   HDL 82 09/13/2022   LDLCALC 161 (H) 09/13/2022   TRIG 365 (H) 09/13/2022   CHOLHDL 3.6 09/13/2022    Significant Diagnostic Results in last 30 days:  No results found.  Assessment/Plan 1. Essential hypertension Blood pressures have been within normal range with some orthostatic blood pressure midodrine has been effective. - CBC with Differential/Platelet - COMPLETE METABOLIC PANEL WITH GFR - TSH  2. Mixed hyperlipidemia Previous LDL 161 stating does not tolerate statin - continue on Ezetimibe  -Dietary modification and exercise advised - Lipid Panel  3. Gastroesophageal reflux disease without esophagitis Symptoms well-controlled H/H stable.No tarry or black stool  - advised to avoid eating meals late in the evening and to avoid aggravating foods and spices. - continue on Omeprazole   - CBC with Differential/Platelet  4. Vitamin B12 deficiency Recent Hgb indices  high.will check Vitamin B 12 levels - Vitamin B12  5. Cramping of hands Recently started on magnesium and potassium due to cramping on hands and feet.will check mg levels - Magnesium  6. Chronic obstructive pulmonary disease, unspecified COPD type (HCC) Breathing stable  - continue on Advair and Albuterol inhalers   7. Major depressive disorder, single episode, severe without psychotic features (HCC) Mood stable  - continue on amitriptyline  - amitriptyline (ELAVIL) 75 MG tablet; Take 1 tablet (75 mg total) by mouth at bedtime.  Dispense: 90 tablet; Refill: 3  8. Migraine with aura and without status migrainosus, not intractable Amitriptyline has been effective last migraine 1 month ago -Continue to monitor  9. Orthostatic hypotension Blood pressure stable Midodrine recently reduced to twice daily by nephrologist.  Family/ staff Communication: Reviewed plan of care with patient verbalized understanding  Labs/tests ordered:   - CBC with Differential/Platelet - COMPLETE METABOLIC PANEL WITH GFR - TSH - Vitamin B 12 - Magnesium - Lipid Panel  Next Appointment : Return in about 6 months (around 09/13/2023) for medical mangement of chronic issues.Pap smear soon. Caesar Bookman, NP

## 2023-03-16 LAB — CBC WITH DIFFERENTIAL/PLATELET
Absolute Monocytes: 211 {cells}/uL (ref 200–950)
Basophils Absolute: 31 {cells}/uL (ref 0–200)
Basophils Relative: 0.8 %
Eosinophils Absolute: 70 {cells}/uL (ref 15–500)
Eosinophils Relative: 1.8 %
HCT: 34.2 % — ABNORMAL LOW (ref 35.0–45.0)
Hemoglobin: 11.3 g/dL — ABNORMAL LOW (ref 11.7–15.5)
Lymphs Abs: 2301 {cells}/uL (ref 850–3900)
MCH: 34.8 pg — ABNORMAL HIGH (ref 27.0–33.0)
MCHC: 33 g/dL (ref 32.0–36.0)
MCV: 105.2 fL — ABNORMAL HIGH (ref 80.0–100.0)
MPV: 10 fL (ref 7.5–12.5)
Monocytes Relative: 5.4 %
Neutro Abs: 1287 {cells}/uL — ABNORMAL LOW (ref 1500–7800)
Neutrophils Relative %: 33 %
Platelets: 220 10*3/uL (ref 140–400)
RBC: 3.25 10*6/uL — ABNORMAL LOW (ref 3.80–5.10)
RDW: 12.9 % (ref 11.0–15.0)
Total Lymphocyte: 59 %
WBC: 3.9 10*3/uL (ref 3.8–10.8)

## 2023-03-16 LAB — VITAMIN B12: Vitamin B-12: 417 pg/mL (ref 200–1100)

## 2023-03-16 LAB — MAGNESIUM: Magnesium: 1.6 mg/dL (ref 1.5–2.5)

## 2023-03-16 LAB — COMPLETE METABOLIC PANEL WITH GFR
AG Ratio: 0.9 (calc) — ABNORMAL LOW (ref 1.0–2.5)
ALT: 33 U/L — ABNORMAL HIGH (ref 6–29)
AST: 40 U/L — ABNORMAL HIGH (ref 10–35)
Albumin: 3.6 g/dL (ref 3.6–5.1)
Alkaline phosphatase (APISO): 210 U/L — ABNORMAL HIGH (ref 37–153)
BUN/Creatinine Ratio: 15 (calc) (ref 6–22)
BUN: 18 mg/dL (ref 7–25)
CO2: 30 mmol/L (ref 20–32)
Calcium: 9.4 mg/dL (ref 8.6–10.4)
Chloride: 102 mmol/L (ref 98–110)
Creat: 1.24 mg/dL — ABNORMAL HIGH (ref 0.50–1.03)
Globulin: 3.9 g/dL — ABNORMAL HIGH (ref 1.9–3.7)
Glucose, Bld: 84 mg/dL (ref 65–99)
Potassium: 4.2 mmol/L (ref 3.5–5.3)
Sodium: 141 mmol/L (ref 135–146)
Total Bilirubin: 0.8 mg/dL (ref 0.2–1.2)
Total Protein: 7.5 g/dL (ref 6.1–8.1)
eGFR: 50 mL/min/{1.73_m2} — ABNORMAL LOW (ref >=60)

## 2023-03-16 LAB — TSH: TSH: 1.95 m[IU]/L (ref 0.40–4.50)

## 2023-03-16 LAB — LIPID PANEL
Cholesterol: 329 mg/dL — ABNORMAL HIGH (ref <200)
HDL: 91 mg/dL (ref >=50)
LDL Cholesterol (Calc): 186 mg/dL — ABNORMAL HIGH
Non-HDL Cholesterol (Calc): 238 mg/dL — ABNORMAL HIGH (ref <130)
Total CHOL/HDL Ratio: 3.6 (calc) (ref <5.0)
Triglycerides: 309 mg/dL — ABNORMAL HIGH (ref <150)

## 2023-03-22 ENCOUNTER — Encounter (INDEPENDENT_AMBULATORY_CARE_PROVIDER_SITE_OTHER): Payer: Medicare Other | Admitting: Family

## 2023-03-23 ENCOUNTER — Encounter: Payer: Self-pay | Admitting: Family

## 2023-03-23 ENCOUNTER — Ambulatory Visit (INDEPENDENT_AMBULATORY_CARE_PROVIDER_SITE_OTHER): Payer: Medicare Other | Admitting: Family

## 2023-03-23 VITALS — BP 118/70 | HR 76 | Temp 98.0°F | Resp 20 | Ht 67.0 in | Wt 199.0 lb

## 2023-03-23 DIAGNOSIS — M25472 Effusion, left ankle: Secondary | ICD-10-CM | POA: Diagnosis not present

## 2023-03-23 DIAGNOSIS — R2681 Unsteadiness on feet: Secondary | ICD-10-CM | POA: Diagnosis not present

## 2023-03-23 DIAGNOSIS — M25572 Pain in left ankle and joints of left foot: Secondary | ICD-10-CM | POA: Diagnosis not present

## 2023-03-23 DIAGNOSIS — R296 Repeated falls: Secondary | ICD-10-CM | POA: Diagnosis not present

## 2023-03-23 MED ORDER — PREDNISONE 20 MG PO TABS: ORAL_TABLET | ORAL | 0 refills | Status: AC

## 2023-03-23 NOTE — Progress Notes (Signed)
Provider: Richarda Blade FNP-C  Ross Hefferan, Donalee Citrin, NP  Patient Care Team: Nuha Degner, Donalee Citrin, NP as PCP - General (Family Medicine) Thomasene Ripple, DO as PCP - Cardiology (Cardiology) Elie Confer, NP as Nurse Practitioner  Extended Emergency Contact Information Primary Emergency Contact: [name],Charles Address: [address]          [place], [place] [address] Darden Amber of [place] Home Phone: [phone] Relation: Spouse Secondary Emergency Contact: [name],Michele Address: [address]          [place], [place] [address] Darden Amber of [place] Home Phone: [phone] Relation: Daughter  Code Status:  Full Code  Goals of care: Advanced Directive information    03/15/2023    8:16 AM  Advanced Directives  Does Patient Have a Medical Advance Directive? No  Would patient like information on creating a medical advance directive? No - Patient declined     Chief Complaint  Patient presents with   Acute Visit    Patient presents today for left ankle pain. She reports pain level 6.    HPI:  Pt is a 57 y.o. female seen today for an acute visit for evaluation of left ankle pain x 1 week.Has had swelling.Rates pain 6 out of 10 on scale  Had to use bedside commode due to inability to bear weight.unable to put toes down on the floor.Also has some cramping on the leg.Took Tylenol for pain with some relief.states Vicodin didn't help.  States slid off the bed trying to get out of the bed.But no injuries to ankle.   Past Medical History:  Diagnosis Date   Acute pansinusitis 08/02/2017   Anxiety    Arthritis    ASD (atrial septal defect)    s/p closure with Amplatzer device 10/05/04 (Dr. Celso Amy, [facility] 10/05/04   [facility])    Cataract    Chronic pain    CKD (chronic kidney disease)    Dyspnea    Fatty liver 09/04/2019   GERD (gastroesophageal reflux disease)    Heart murmur    no longer heard   History of hiatal hernia    Hyperlipidemia    Legally  blind in right eye, as defined in [place]    Lumbar herniated disc    Migraines    On home oxygen therapy 06/15/2022   Pt was given O2 on D/C from [facility] 04/2022   OSA on CPAP 06/15/2022   PONV (postoperative nausea and vomiting)    Sciatica    Past Surgical History:  Procedure Laterality Date   ABLATION     BREAST LUMPECTOMY WITH RADIOACTIVE SEED AND SENTINEL LYMPH NODE BIOPSY Right 11/23/2020   Procedure: RIGHT BREAST LUMPECTOMY WITH RADIOACTIVE SEED AND RIGHT AXILLARY SENTINEL LYMPH NODE BIOPSY;  Surgeon: Emelia Loron, MD;  Location: [facility] OR;  Service: General;  Laterality: Right;   BREAST SURGERY Bilateral 2011   Breast Reduction Surgery   BUNIONECTOMY     CARDIAC CATHETERIZATION     10/05/04 [facility]): LM < 25%, otherwise normal coronaries. No pulmonary HTN, Mildly enlarged RV. Secundum ASD s/p closure.   CARDIAC SURGERY     CATARACT EXTRACTION     CLEFT PALATE REPAIR     s/p cleft lip and palate repair   EYE SURGERY Right 2019   right eye removed   LUMBAR LAMINECTOMY/DECOMPRESSION MICRODISCECTOMY Left 05/23/2016   Procedure: LEFT L4-L5 LATERAL RECESS DECOMPRESSION WITH CENTRAL AND RIGHT DECOMPRESSION VIA LEFT SIDE;  Surgeon: Kerrin Champagne, MD;  Location: [facility] OR;  Service: Orthopedics;  Laterality: Left;   LUMBAR LAMINECTOMY/DECOMPRESSION MICRODISCECTOMY Left 05/23/2016   Procedure: LUMBAR LAMINECTOMY/DECOMPRESSION MICRODISCECTOMY Lumbar five - Sacral One 1 LEVEL;  Surgeon: Kerrin Champagne, MD;  Location: [facility] OR;  Service: Orthopedics;  Laterality: Left;   REDUCTION MAMMAPLASTY  2011   SHOULDER INJECTION Left 05/23/2016   Procedure: SHOULDER INJECTION;  Surgeon: Kerrin Champagne, MD;  Location: [facility] OR;  Service: Orthopedics;  Laterality: Left;  band-aid per pa-c   TRANSTHORACIC ECHOCARDIOGRAM     12/15/05 [facility]): Mild LVH, EF > 55%, grade 1 diastolic dysfunction, Trivial MR/PR/TR.   TUBAL LIGATION      Allergies  Allergen Reactions   Zithromax [Azithromycin] Shortness Of Breath, Itching  and Other (See Comments)    TOTAL BODY ITCHING [EVEN SOLES OF FEET] WHEEZING    Tramadol Itching and Other (See Comments)    Has taken recently without any side effects.   Psyllium Nausea And Vomiting and Other (See Comments)    Metamucil. Sneezing      Outpatient Encounter Medications as of 03/23/2023  Medication Sig   acetaminophen (TYLENOL) 500 MG tablet Take 500 mg by mouth daily as needed for moderate pain.   albuterol (VENTOLIN HFA) 108 (90 Base) MCG/ACT inhaler Inhale 2 puffs into the lungs every 6 (six) hours as needed for wheezing or shortness of breath.   ALPRAZolam (XANAX) 1 MG tablet TAKE 1 TABLET BY MOUTH AT  BEDTIME AS NEEDED FOR ANXIETY   amitriptyline (ELAVIL) 75 MG tablet Take 1 tablet (75 mg total) by mouth at bedtime.   Carboxymethylcellul-Glycerin (LUBRICATING EYE DROPS OP) Place 1 drop into the right eye in the morning, at noon, and at bedtime.   cyanocobalamin (VITAMIN B12) 500 MCG tablet Take 1 tablet (500 mcg total) by mouth daily.   diphenoxylate-atropine (LOMOTIL) 2.5-0.025 MG tablet Take 1 tablet by mouth 3 (three) times daily as needed for diarrhea or loose stools.   ezetimibe (ZETIA) 10 MG tablet TAKE 1 TABLET BY MOUTH DAILY   fluticasone-salmeterol (ADVAIR) 250-50 MCG/ACT AEPB Inhale 1 puff into the lungs in the morning and at bedtime.   HYDROcodone-acetaminophen (NORCO/VICODIN) 5-325 MG tablet Take 1 tablet by mouth every 12 (twelve) hours as needed for severe pain. Must last 30 days.   [START ON 03/30/2023] HYDROcodone-acetaminophen (NORCO/VICODIN) 5-325 MG tablet Take 1 tablet by mouth every 12 (twelve) hours as needed for severe pain. Must last 30 days.   [START ON 04/29/2023] HYDROcodone-acetaminophen (NORCO/VICODIN) 5-325 MG tablet Take 1 tablet by mouth every 12 (twelve) hours as needed for severe pain. Must last 30 days.   ketoconazole (NIZORAL) 2 % cream Apply 1 Application topically 2 (two) times daily.   loperamide (IMODIUM) 2 MG capsule Take 1-2  capsules (2-4 mg total) by mouth 4 (four) times daily as needed for diarrhea or loose stools.   methocarbamol (ROBAXIN) 500 MG tablet Take 1 tablet (500 mg total) by mouth every 6 (six) hours as needed for muscle spasms.   midodrine (PROAMATINE) 10 MG tablet Take 1 tablet (10 mg total) by mouth in the morning and at bedtime.   Multiple Vitamin (MULTIVITAMIN WITH MINERALS) TABS tablet Take 1 tablet by mouth daily.   omeprazole (PRILOSEC OTC) 20 MG tablet Take 20 mg by mouth daily.   PRESCRIPTION MEDICATION CPAP- At bedtime   triamcinolone cream (KENALOG) 0.1 % Apply 1 Application topically 2 (two) times daily.   VERZENIO 100 MG tablet TAKE 1 TABLET BY MOUTH TWICE DAILY   Vitamin D, Ergocalciferol, (DRISDOL) 1.25 MG (50000 UNIT) CAPS  capsule Take 1 capsule (50,000 Units total) by mouth every 7 (seven) days. Monday   White Petrolatum-Mineral Oil (LUBRICANT EYE NIGHTTIME) OINT Place 1 drop into the left eye at bedtime as needed (dryness).   XIIDRA 5 % SOLN Place 1 drop into the left eye 2 (two) times daily.   No facility-administered encounter medications on file as of 03/23/2023.    Review of Systems  Constitutional:  Negative for appetite change, chills, fatigue and fever.  Respiratory:  Negative for cough, chest tightness, shortness of breath and wheezing.   Cardiovascular:  Negative for chest pain, palpitations and leg swelling.  Musculoskeletal:  Positive for arthralgias and gait problem. Negative for back pain, joint swelling, myalgias, neck pain and neck stiffness.       Left ankle pain   Skin:  Negative for color change, pallor, rash and wound.  Neurological:  Negative for dizziness, weakness, light-headedness, numbness and headaches.    Immunization History  Administered Date(s) Administered   Hepatitis A 03/02/2023   Hepatitis B 03/02/2023   Influenza Inj Mdck Quad Pf 04/25/2019   Influenza,inj,Quad PF,6+ Mos 02/24/2020, 04/15/2022, 02/21/2023   Influenza,inj,quad, With  Preservative 04/25/2019   Influenza-Unspecified 05/26/2021   Moderna Sars-Covid-2 Vaccination 06/29/2019, 08/03/2019, 06/24/2020   PFIZER(Purple Top)SARS-COV-2 Vaccination 05/26/2021   Pneumococcal Conjugate-13 05/29/2019   Pneumococcal Polysaccharide-23 07/29/2019   Tdap 02/21/2023   Unspecified SARS-COV-2 Vaccination 02/21/2023   Zoster Recombinant(Shingrix) 02/17/2020, 04/20/2020   Pertinent  Health Maintenance Due  Topic Date Due   MAMMOGRAM  09/13/2024   Colonoscopy  08/16/2027   INFLUENZA VACCINE  Completed      10/06/2022    1:24 PM 01/10/2023    9:01 AM 02/28/2023    9:49 AM 03/15/2023    8:44 AM 03/23/2023    1:25 PM  Fall Risk  Falls in the past year? 0 1 1 1 1   Was there an injury with Fall?  0 0 0 0  Fall Risk Category Calculator  1 1 2 1   Patient at Risk for Falls Due to No Fall Risks   History of fall(s) History of fall(s)  Fall risk Follow up Falls evaluation completed   Falls evaluation completed Falls evaluation completed;Education provided;Falls prevention discussed   Functional Status Survey:    Vitals:   03/23/23 1322  BP: 118/70  Pulse: 76  Resp: 20  Temp: 98 F (36.7 C)  SpO2: 98%  Weight: 199 lb (90.3 kg)  Height: 5\' 7"  (1.702 m)   Body mass index is 31.17 kg/m. Physical Exam Vitals reviewed.  Constitutional:      General: She is not in acute distress.    Appearance: Normal appearance. She is normal weight. She is not ill-appearing or diaphoretic.  HENT:     Head: Normocephalic.  Cardiovascular:     Rate and Rhythm: Normal rate and regular rhythm.     Pulses: Normal pulses.     Heart sounds: Normal heart sounds. No murmur heard.    No friction rub. No gallop.  Pulmonary:     Effort: Pulmonary effort is normal. No respiratory distress.     Breath sounds: Normal breath sounds. No wheezing, rhonchi or rales.  Chest:     Chest wall: No tenderness.  Musculoskeletal:        General: No swelling. Normal range of motion.     Right lower leg:  No edema.     Left lower leg: No edema.     Right ankle: Normal.     Right Achilles  Tendon: Normal.     Left ankle: Swelling present. No ecchymosis. Tenderness present. Anterior drawer test negative. Normal pulse.     Left Achilles Tendon: Normal.  Skin:    General: Skin is warm and dry.     Coloration: Skin is not pale.     Findings: No bruising, erythema, lesion or rash.  Neurological:     Mental Status: She is alert and oriented to person, place, and time.     Cranial Nerves: No cranial nerve deficit.     Sensory: No sensory deficit.     Motor: No weakness.     Coordination: Coordination normal.     Gait: Gait normal.  Psychiatric:        Mood and Affect: Mood normal.        Speech: Speech normal.        Behavior: Behavior normal.        Thought Content: Thought content normal.        Judgment: Judgment normal.     Labs reviewed: Recent Labs    04/24/22 1545 04/25/22 0844 04/26/22 0518 05/06/22 2130 07/27/22 0257 07/28/22 0249 08/08/22 1421 08/19/22 0719 01/27/23 0725 02/24/23 0755 03/15/23 0927  NA 140 143 140   < > 136   < > 141   < > 142 141 141  K 2.0* 3.1* 3.1*   < > 3.9   < > 4.2   < > 3.2* 3.5 4.2  CL 91* 97* 97*   < > 99   < > 99   < > 105 102 102  CO2 37* 37* 35*   < > 25   < > 30   < > 29 32 30  GLUCOSE 118* 100* 104*   < > 228*   < > 82   < > 88 115* 84  BUN 10 8 8    < > 21*   < > 10   < > 12 12 18   CREATININE 1.54* 1.27* 1.12*   < > 0.98   < > 1.28*   < > 1.34* 1.35* 1.24*  CALCIUM 8.0* 7.7* 7.9*   < > 9.4   < > 9.5   < > 9.1 8.6* 9.4  MG 1.3* 1.5* 1.8   < > 2.0  --  1.2*  --   --   --  1.6  PHOS 1.9* 1.7* 2.6  --   --   --   --   --   --   --   --    < > = values in this interval not displayed.   Recent Labs    12/28/22 1239 01/27/23 0725 02/24/23 0755 03/15/23 0927  AST 65* 90* 46* 40*  ALT 47* 61* 37 33*  ALKPHOS 267* 334* 201*  --   BILITOT 0.6 0.9 1.5* 0.8  PROT 6.7 7.4 6.9 7.5  ALBUMIN 3.6 3.8 3.4*  --    Recent Labs     01/27/23 0725 02/24/23 0755 03/15/23 0927  WBC 4.1 3.2* 3.9  NEUTROABS 1.4* 1.0* 1,287*  HGB 11.2* 10.8* 11.3*  HCT 33.3* 32.6* 34.2*  MCV 104.1* 105.2* 105.2*  PLT 173 172 220   Lab Results  Component Value Date   TSH 1.95 03/15/2023   Lab Results  Component Value Date   HGBA1C 5.3 07/26/2022   Lab Results  Component Value Date   CHOL 329 (H) 03/15/2023   HDL 91 03/15/2023   LDLCALC 186 (H) 03/15/2023   TRIG 309 (H)  03/15/2023   CHOLHDL 3.6 03/15/2023    Significant Diagnostic Results in last 30 days:  No results found.  Assessment/Plan 1. Pain and swelling of left ankle Swollen ankle and tender to touch unable to bear weight. Suspect possible gout.will treat with Prednisone taper. - Please get left ankle  X-ray at [facility] imaging at [phone] then will call you with results. - DG Ankle Complete Left; Future - Uric Acid - Sedimentation rate  2. Unsteady gait Fall and safety precautions - Ambulatory referral to Physical Therapy: Gait stability, range of motion exercises and muscle strengthening  3. Falling episodes Fall and safety precaution advised.  Will refer to physical therapy as above. - Ambulatory referral to Physical Therapy  Family/ staff Communication: Reviewed plan of care with patient verbalized understanding  Labs/tests ordered:  - DG Ankle Complete Left; Future - Uric Acid - Sedimentation rate  Next Appointment: Return if symptoms worsen or fail to improve.   Caesar Bookman, NP

## 2023-03-23 NOTE — Patient Instructions (Signed)
-   Please get left ankle X-ray at [facility] imaging at [facility] then will call you with results.

## 2023-03-24 ENCOUNTER — Ambulatory Visit
Admission: RE | Admit: 2023-03-24 | Discharge: 2023-03-24 | Disposition: A | Discharge status: Home / Self Care | Payer: Medicare Other | Source: Ambulatory Visit | Attending: Family | Admitting: Family

## 2023-03-24 DIAGNOSIS — M19072 Primary osteoarthritis, left ankle and foot: Secondary | ICD-10-CM | POA: Diagnosis not present

## 2023-03-24 DIAGNOSIS — M7752 Other enthesopathy of left foot: Secondary | ICD-10-CM | POA: Diagnosis not present

## 2023-03-24 DIAGNOSIS — M25472 Effusion, left ankle: Secondary | ICD-10-CM

## 2023-03-24 LAB — SEDIMENTATION RATE: Sed Rate: 60 mm/h — ABNORMAL HIGH (ref 0–30)

## 2023-03-24 LAB — URIC ACID: Uric Acid, Serum: 6.6 mg/dL (ref 2.5–7.0)

## 2023-03-29 ENCOUNTER — Other Ambulatory Visit: Payer: Self-pay

## 2023-03-29 DIAGNOSIS — C50919 Malignant neoplasm of unspecified site of unspecified female breast: Secondary | ICD-10-CM

## 2023-03-30 ENCOUNTER — Other Ambulatory Visit: Payer: Self-pay | Admitting: Hematology

## 2023-03-30 DIAGNOSIS — C50919 Malignant neoplasm of unspecified site of unspecified female breast: Secondary | ICD-10-CM

## 2023-03-31 ENCOUNTER — Telehealth: Payer: Self-pay | Admitting: Hematology

## 2023-03-31 ENCOUNTER — Inpatient Hospital Stay: Payer: Medicare Other

## 2023-03-31 NOTE — Telephone Encounter (Signed)
Patient was sick and unable to make it to her appointment, patient is aware of rescheduled appointmetn times/dates

## 2023-04-03 ENCOUNTER — Inpatient Hospital Stay: Payer: Medicare Other

## 2023-04-03 ENCOUNTER — Inpatient Hospital Stay: Payer: Medicare Other | Attending: Hematology

## 2023-04-03 ENCOUNTER — Other Ambulatory Visit: Payer: Self-pay

## 2023-04-03 VITALS — BP 138/73 | HR 91 | Temp 98.1°F | Resp 18

## 2023-04-03 DIAGNOSIS — E876 Hypokalemia: Secondary | ICD-10-CM | POA: Insufficient documentation

## 2023-04-03 DIAGNOSIS — C50511 Malignant neoplasm of lower-outer quadrant of right female breast: Secondary | ICD-10-CM | POA: Insufficient documentation

## 2023-04-03 DIAGNOSIS — Z79818 Long term (current) use of other agents affecting estrogen receptors and estrogen levels: Secondary | ICD-10-CM | POA: Diagnosis not present

## 2023-04-03 DIAGNOSIS — Z87891 Personal history of nicotine dependence: Secondary | ICD-10-CM | POA: Diagnosis not present

## 2023-04-03 DIAGNOSIS — Z79899 Other long term (current) drug therapy: Secondary | ICD-10-CM | POA: Insufficient documentation

## 2023-04-03 DIAGNOSIS — C7800 Secondary malignant neoplasm of unspecified lung: Secondary | ICD-10-CM | POA: Insufficient documentation

## 2023-04-03 DIAGNOSIS — Z170 Estrogen receptor positive status [ER+]: Secondary | ICD-10-CM | POA: Insufficient documentation

## 2023-04-03 DIAGNOSIS — C50919 Malignant neoplasm of unspecified site of unspecified female breast: Secondary | ICD-10-CM

## 2023-04-03 DIAGNOSIS — Z5111 Encounter for antineoplastic chemotherapy: Secondary | ICD-10-CM | POA: Insufficient documentation

## 2023-04-03 LAB — CBC WITH DIFFERENTIAL (CANCER CENTER ONLY)
Abs Immature Granulocytes: 0.01 10*3/uL (ref 0.00–0.07)
Basophils Absolute: 0 10*3/uL (ref 0.0–0.1)
Basophils Relative: 1 %
Eosinophils Absolute: 0 10*3/uL (ref 0.0–0.5)
Eosinophils Relative: 1 %
HCT: 36.2 % (ref 36.0–46.0)
Hemoglobin: 12.3 g/dL (ref 12.0–15.0)
Immature Granulocytes: 0 %
Lymphocytes Relative: 53 %
Lymphs Abs: 2.1 10*3/uL (ref 0.7–4.0)
MCH: 35.9 pg — ABNORMAL HIGH (ref 26.0–34.0)
MCHC: 34 g/dL (ref 30.0–36.0)
MCV: 105.5 fL — ABNORMAL HIGH (ref 80.0–100.0)
Monocytes Absolute: 0.3 10*3/uL (ref 0.1–1.0)
Monocytes Relative: 8 %
Neutro Abs: 1.5 10*3/uL — ABNORMAL LOW (ref 1.7–7.7)
Neutrophils Relative %: 37 %
Platelet Count: 173 10*3/uL (ref 150–400)
RBC: 3.43 MIL/uL — ABNORMAL LOW (ref 3.87–5.11)
RDW: 14.4 % (ref 11.5–15.5)
WBC Count: 4 10*3/uL (ref 4.0–10.5)
nRBC: 0 % (ref 0.0–0.2)

## 2023-04-03 LAB — CMP (CANCER CENTER ONLY)
ALT: 88 U/L — ABNORMAL HIGH (ref 0–44)
AST: 69 U/L — ABNORMAL HIGH (ref 15–41)
Albumin: 3.8 g/dL (ref 3.5–5.0)
Alkaline Phosphatase: 255 U/L — ABNORMAL HIGH (ref 38–126)
Anion gap: 7 (ref 5–15)
BUN: 16 mg/dL (ref 6–20)
CO2: 27 mmol/L (ref 22–32)
Calcium: 9.7 mg/dL (ref 8.9–10.3)
Chloride: 104 mmol/L (ref 98–111)
Creatinine: 1.33 mg/dL — ABNORMAL HIGH (ref 0.44–1.00)
GFR, Estimated: 46 mL/min — ABNORMAL LOW (ref >60)
Glucose, Bld: 91 mg/dL (ref 70–99)
Potassium: 4 mmol/L (ref 3.5–5.1)
Sodium: 138 mmol/L (ref 135–145)
Total Bilirubin: 1 mg/dL (ref 0.3–1.2)
Total Protein: 7.5 g/dL (ref 6.5–8.1)

## 2023-04-03 MED ORDER — DIPHENHYDRAMINE HCL 25 MG PO CAPS: 25.0000 mg | ORAL_CAPSULE | Freq: Once | ORAL | Status: DC

## 2023-04-03 MED ORDER — FAMOTIDINE 20 MG PO TABS: 20.0000 mg | ORAL_TABLET | Freq: Once | ORAL | Status: DC

## 2023-04-03 MED ORDER — FULVESTRANT 250 MG/5ML IM SOSY
500.0000 mg | PREFILLED_SYRINGE | Freq: Once | INTRAMUSCULAR | Status: AC
  Administered 2023-04-03: 500 mg via INTRAMUSCULAR

## 2023-04-03 MED ORDER — IBUPROFEN 200 MG PO TABS: 400.0000 mg | ORAL_TABLET | Freq: Once | ORAL | Status: DC

## 2023-04-04 ENCOUNTER — Ambulatory Visit: Payer: Medicare Other | Attending: Family

## 2023-04-04 DIAGNOSIS — R29898 Other symptoms and signs involving the musculoskeletal system: Secondary | ICD-10-CM | POA: Insufficient documentation

## 2023-04-04 DIAGNOSIS — R262 Difficulty in walking, not elsewhere classified: Secondary | ICD-10-CM | POA: Insufficient documentation

## 2023-04-04 DIAGNOSIS — R2681 Unsteadiness on feet: Secondary | ICD-10-CM | POA: Insufficient documentation

## 2023-04-05 ENCOUNTER — Other Ambulatory Visit: Payer: Self-pay | Admitting: Adult Health

## 2023-04-05 ENCOUNTER — Telehealth: Payer: Medicare Other

## 2023-04-05 DIAGNOSIS — F5101 Primary insomnia: Secondary | ICD-10-CM

## 2023-04-05 DIAGNOSIS — C50919 Malignant neoplasm of unspecified site of unspecified female breast: Secondary | ICD-10-CM

## 2023-04-05 MED ORDER — ALPRAZOLAM 1 MG PO TABS: 1.0000 mg | ORAL_TABLET | Freq: Every evening | ORAL | 0 refills | Status: DC | PRN

## 2023-04-05 NOTE — Telephone Encounter (Signed)
Providers response    Medina-Vargas, Monina C, NP  You28 minutes ago (4:35 PM)    Yes, [place] was [place].  Thanks.  Monina  Pharmacist made aware of providers response

## 2023-04-05 NOTE — Progress Notes (Signed)
  This encounter was created in error - please disregard. No show 

## 2023-04-05 NOTE — Telephone Encounter (Signed)
Message left on clinical intake voicemail:    Pharmacist would like to confirm that it is ok to dispense Xanax as patient is also getting an opioid (hydrocodone) from a provider in [place] (pain management) and he would like confirmation that we also checked the [place] controlled Substance Database   Please advise

## 2023-04-05 NOTE — Telephone Encounter (Signed)
Patient called to request a refill on her Xanax to [facility] on [address] , as per patient " [facility] takes too long."  Patient is requesting a refill of the following medications: Requested Prescriptions   Pending Prescriptions Disp Refills   ALPRAZolam (XANAX) 1 MG tablet 30 tablet 1    Sig: Take 1 tablet (1 mg total) by mouth at bedtime as needed for anxiety.    Date of last refill: 12/12/2022  Refill amount: 30/3 was sent to [facility]   Treatment agreement date: 03/15/23, notation made on next appointment to update to reflect [facility] as the pharmacy of choice versus [facility]

## 2023-04-06 ENCOUNTER — Telehealth: Payer: Self-pay | Admitting: Pharmacy Technician

## 2023-04-06 ENCOUNTER — Other Ambulatory Visit: Payer: Self-pay

## 2023-04-06 ENCOUNTER — Ambulatory Visit: Payer: Medicare Other

## 2023-04-06 DIAGNOSIS — R29898 Other symptoms and signs involving the musculoskeletal system: Secondary | ICD-10-CM

## 2023-04-06 DIAGNOSIS — R2681 Unsteadiness on feet: Secondary | ICD-10-CM

## 2023-04-06 DIAGNOSIS — R262 Difficulty in walking, not elsewhere classified: Secondary | ICD-10-CM | POA: Diagnosis not present

## 2023-04-06 NOTE — Therapy (Signed)
OUTPATIENT PHYSICAL THERAPY NEURO EVALUATION   Patient Name: Brandy [name] MRN: [id number] DOB:27-May-1966, 57 y.o., female Today's Date: 04/06/2023   PCP: Caesar Bookman, NP REFERRING PROVIDER: Caesar Bookman, NP  END OF SESSION:  PT End of Session - 04/06/23 1433     Visit Number 1    Number of Visits 5    Date for PT Re-Evaluation 05/04/23    Authorization Type [employer] Medicare    PT Start Time 1445    PT Stop Time 1530    PT Time Calculation (min) 45 min             Past Medical History:  Diagnosis Date   Acute pansinusitis 08/02/2017   Anxiety    Arthritis    ASD (atrial septal defect)    s/p closure with Amplatzer device 10/05/04 (Dr. Celso Amy, [facility]) 10/05/04   Cancer [facility])    Cataract    Chronic pain    CKD (chronic kidney disease)    Dyspnea    Fatty liver 09/04/2019   GERD (gastroesophageal reflux disease)    Heart murmur    no longer heard   History of hiatal hernia    Hyperlipidemia    Legally blind in right eye, as defined in [place]    Lumbar herniated disc    Migraines    On home oxygen therapy 06/15/2022   Pt was given O2 on D/C from [facility] 04/2022   OSA on CPAP 06/15/2022   PONV (postoperative nausea and vomiting)    Sciatica    Past Surgical History:  Procedure Laterality Date   ABLATION     BREAST LUMPECTOMY WITH RADIOACTIVE SEED AND SENTINEL LYMPH NODE BIOPSY Right 11/23/2020   Procedure: RIGHT BREAST LUMPECTOMY WITH RADIOACTIVE SEED AND RIGHT AXILLARY SENTINEL LYMPH NODE BIOPSY;  Surgeon: Emelia Loron, MD;  Location: [facility] OR;  Service: General;  Laterality: Right;   BREAST SURGERY Bilateral 2011   Breast Reduction Surgery   BUNIONECTOMY     CARDIAC CATHETERIZATION     10/05/04 [facility]): LM < 25%, otherwise normal coronaries. No pulmonary HTN, Mildly enlarged RV. Secundum ASD s/p closure.   CARDIAC SURGERY     CATARACT EXTRACTION     CLEFT PALATE REPAIR     s/p cleft lip and palate repair   EYE  SURGERY Right 2019   right eye removed   LUMBAR LAMINECTOMY/DECOMPRESSION MICRODISCECTOMY Left 05/23/2016   Procedure: LEFT L4-L5 LATERAL RECESS DECOMPRESSION WITH CENTRAL AND RIGHT DECOMPRESSION VIA LEFT SIDE;  Surgeon: Kerrin Champagne, MD;  Location: [facility] OR;  Service: Orthopedics;  Laterality: Left;   LUMBAR LAMINECTOMY/DECOMPRESSION MICRODISCECTOMY Left 05/23/2016   Procedure: LUMBAR LAMINECTOMY/DECOMPRESSION MICRODISCECTOMY Lumbar five - Sacral One 1 LEVEL;  Surgeon: Kerrin Champagne, MD;  Location: [facility] OR;  Service: Orthopedics;  Laterality: Left;   REDUCTION MAMMAPLASTY  2011   SHOULDER INJECTION Left 05/23/2016   Procedure: SHOULDER INJECTION;  Surgeon: Kerrin Champagne, MD;  Location: [facility] OR;  Service: Orthopedics;  Laterality: Left;  band-aid per pa-c   TRANSTHORACIC ECHOCARDIOGRAM     12/15/05 [facility]): Mild LVH, EF > 55%, grade 1 diastolic dysfunction, Trivial MR/PR/TR.   TUBAL LIGATION     Patient Active Problem List   Diagnosis Date Noted   Chronic obstructive pulmonary disease, unspecified COPD type (HCC) 03/15/2023   Major depressive disorder, single episode, severe without psychotic features (HCC) 03/15/2023   Chronic respiratory failure with hypoxia (HCC) 05/08/2022   Orthostatic hypotension 04/24/2022   Hyperbilirubinemia 04/24/2022  Snoring 07/14/2021   Fatigue 07/14/2021   Daytime somnolence 07/14/2021   Status post device closure of ASD 06/03/2021   Secundum ASD 06/03/2021   Decreased diffusion capacity 05/14/2021   Dyspnea 03/04/2021   Pulmonary nodules 04/01/2020   Primary malignant neoplasm of breast with metastasis (HCC) 02/24/2020   Spondylosis without myelopathy or radiculopathy, lumbosacral region 12/31/2019   Chronic low back pain (Bilateral) w/o sciatica 12/31/2019   Chronic hip pain (3ry area of Pain) (Bilateral) (R>L) 11/27/2019   Chronic sacroiliac joint pain (Left) 11/27/2019   Chronic pain syndrome 11/11/2019   Pharmacologic therapy 11/11/2019   Disorder of  skeletal system 11/11/2019   Problems influencing health status 11/11/2019   Abnormal MRI, lumbar spine (08/22/2018) 11/11/2019   Chronic low back pain (1ry area of Pain) (Bilateral) w/ sciatica (Bilateral) 11/11/2019   DDD (degenerative disc disease), lumbosacral 11/11/2019   Grade 1 Anterolisthesis (L2-3, L3-4) 11/11/2019   Grade 1 Retrolisthesis (L4-5, L5-S1) 11/11/2019   Lumbar facet hypertrophy (Multilevel) (Bilateral) 11/11/2019   Lumbar facet syndrome (Multilevel) (Bilateral) (R>L) 11/11/2019   Chronic lower extremity pain (2ry area of Pain) (Bilateral) (R>L) 11/11/2019   Neurogenic pain 11/11/2019   Chronic musculoskeletal pain 11/11/2019   Infiltrating ductal carcinoma of breast (HCC) 10/08/2019   Fatty liver 09/04/2019   Nausea & vomiting 09/04/2019   Obesity (BMI 30-39.9) 09/04/2019   GERD (gastroesophageal reflux disease)    Hyperlipidemia    CKD (chronic kidney disease), stage III (HCC)    Depression    Malignant hypertension    Ocular proptosis 02/14/2019   PVD (posterior vitreous detachment), left eye 02/14/2019   Chorioretinal scar of left eye 02/14/2019   Vitreous floaters of left eye 02/14/2019   Bilateral leg weakness 01/24/2019   Insomnia 08/02/2017   Low back pain radiating to both legs 08/02/2017   Migraine 08/02/2017   Neuropathy 08/02/2017   Spinal stenosis, 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    Class: Chronic   Left shoulder tendonitis 05/23/2016    Class: Acute   Post laminectomy syndrome 05/23/2016   Anxiety disorder 01/07/2015    ONSET DATE: most recent fall 3 weeks ago  REFERRING DIAG: R26.81 (ICD-10-CM) - Unsteady gait R29.6 (ICD-10-CM) - Falling episodes  THERAPY DIAG:  Difficulty in walking, not elsewhere classified  Unsteadiness on feet  Other symptoms and signs involving the musculoskeletal system  Rationale for Evaluation and Treatment: Rehabilitation  SUBJECTIVE:  SUBJECTIVE STATEMENT: Hx of falling and experiencing recent onset of left ankle pain. Has been x-rayed but does not know results.  Pain hurts medial-lateral ankle. Had prednisone taper which did not help ankle pain, ad testing was negative for gout.  Pt reports long hx of orthostatic hypotension which has recently been addressed medically. Notes her episodes of syncope have been less since that time. No pattern to nature of falling.  Pt accompanied by: self  PERTINENT HISTORY: medical histroy orthostatic hypotension, hyperlipidemia, COPD, GERD, chronic pain syndrome, spinal stenosis, lumbar region with neurology claudication post laminectomy syndrome, chronic kidney disease stage III, generalized anxiety disorder, major depression degenerative disc disease  PAIN:  Are you having pain? Yes: NPRS scale: 5/10 Pain location: left ankle Pain description: sore and tender Aggravating factors: weightbearing/walking Relieving factors: elevation, ice  PRECAUTIONS: Fall  RED FLAGS: None   WEIGHT BEARING RESTRICTIONS: No  FALLS: Has patient fallen in last 6 months? Yes. Number of falls 3  LIVING ENVIRONMENT: Lives with: lives alone Lives in: House/apartment Stairs: No Has following equipment at home: Single point cane, Walker - 4 wheeled, and bed side commode  PLOF: Independent, Independent with household mobility without device, and Independent with community mobility with device  PATIENT GOALS: improve ankle pain and balance, reduce falls.   OBJECTIVE:  Note: Objective measures were completed at Evaluation unless otherwise noted.  DIAGNOSTIC FINDINGS: x-ray of left ankle, awaiting results  COGNITION: Overall cognitive status: Within functional limits for tasks assessed   SENSATION: Reports hx of neuropathy related to lumbar  spine  COORDINATION:   EDEMA:  None appreciated  MUSCLE TONE: NT  MUSCLE LENGTH: WFL  DTRs:  NT  POSTURE: No Significant postural limitations  LOWER EXTREMITY ROM:     WNL  LOWER EXTREMITY MMT:    5/5 gross BLE  BED MOBILITY:  independent  TRANSFERS: Assistive device utilized: None  Sit to stand: Complete Independence Stand to sit: Complete Independence Chair to chair: Complete Independence Floor:  NT    CURB:  NT  STAIRS: NT  GAIT: Gait pattern: decreased stride length, decreased left foot clearance Distance walked: 800 ft Assistive device utilized: Environmental consultant - 4 wheeled Level of assistance: Modified independence Comments:   FUNCTIONAL TESTS:  5 times sit to stand: 19 sec Timed up and go (TUG): 14 sec w/ RW, 13 sec w/out RW Berg Balance Scale: 49/56 Single leg stance: able to initiate, unable to hold  M-CTSIB  Condition 1: Firm Surface, EO 30 Sec, Normal Sway  Condition 2: Firm Surface, EC 30 Sec, Mild Sway  Condition 3: Foam Surface, EO 30 Sec, Mild Sway  Condition 4: Foam Surface, EC 30 Sec, Mild Sway   : 800 ft, rates 7/10 RPE. Ankle pain progressed from 5/10 to 7/10 during course of test  Vitals pre: 97%, 100 bpm, 121/78 mmHg Vitals post: 97%, 107 bpm, 133/80 mmHg    PATIENT EDUCATION: Education details: assessment details, rationale of PT intervention Person educated: Patient Education method: Explanation Education comprehension: verbalized understanding  HOME EXERCISE PROGRAM: TBD  GOALS: Goals reviewed with patient? Yes  SHORT TERM GOALS: Target date: same as LTG      LONG TERM GOALS: Target date: 05/04/2023    Patient will be independent in HEP to improve functional outcomes Baseline:  Goal status: INITIAL  2.  Manifest improved BLE strength per time 15 sec 5xSTS test to improve mobility/reduce fall risk Baseline: 19 sec Goal status: INITIAL  3.  Demo low risk for falls per time  12 sec TUG test Baseline: 13-14  sec Goal status: INITIAL  4.  Demo improved gait endurance/activity tolerance per distance of 950 ft during RPE 5/10 and ankle pain not exceeding 3/10 Baseline: 800 ft w/ 4WW, RPE 7/10, ankle pain 5-7/10 Goal status: INITIAL  5.  Reduce risk for falls as evidenced by ability to perform single limb stance x 5 sec bilaterally Baseline: unable Goal status: INITIAL    ASSESSMENT:  CLINICAL IMPRESSION: Patient is a 57 y.o. lady who was seen today for physical therapy evaluation and treatment for history of falling and balance deficits.  Pt has also been experiencing increased onset of left ankle pain without known MOI.  Balance and fall risk tests reveal increased risk for falls under 5xSTS test, TUG test, and Single Leg Stance test with low risk per [employer] Test.  Reduced ambulation endurance/activity tolerance evident by distance as compared to age-matched peers.  Pt would benefit from PT services to develop and instruct activities/interventions to reduce left ankle pain, improve balance, activity tolerance, and reduce risk for falls   OBJECTIVE IMPAIRMENTS: Abnormal gait, decreased activity tolerance, decreased balance, decreased mobility, difficulty walking, decreased strength, and pain.   ACTIVITY LIMITATIONS: carrying, lifting, bending, stairs, transfers, and locomotion level  PARTICIPATION LIMITATIONS: meal prep, cleaning, shopping, community activity, and exercise routine  PERSONAL FACTORS: Age, Time since onset of injury/illness/exacerbation, and 3+ comorbidities: PMH  are also affecting patient's functional outcome.   REHAB POTENTIAL: Good  CLINICAL DECISION MAKING: Evolving/moderate complexity  EVALUATION COMPLEXITY: Moderate  PLAN:  PT FREQUENCY: 1x/week  PT DURATION: 4 weeks  PLANNED INTERVENTIONS: 97110-Therapeutic exercises, 97530- Therapeutic activity, [id number]- Neuromuscular re-education, 97535- Self Care, [id number]- Manual therapy, [phone]- Gait training,  [phone]- Canalith repositioning, [phone]- Aquatic Therapy, Patient/Family education, Balance training, Stair training, Taping, Dry Needling, Joint mobilization, Spinal mobilization, Vestibular training, DME instructions, Cryotherapy, and Moist heat  PLAN FOR NEXT SESSION: develop HEP: supported SLS, ankle ROM?, general conditioning   5:19 PM, 04/06/23 M. Shary Decamp, PT, DPT Physical Therapist- [place] Office Number: [phone]

## 2023-04-06 NOTE — Telephone Encounter (Signed)
Oral Oncology Patient Advocate Encounter   Received notification that patient is due for re-enrollment for assistance for Verzenio through [employer].   Re-enrollment process has been initiated and will be submitted upon completion of necessary documents.  Patient prefers to sign documents electronically. Confirmed email address:  [email or web address]   [phone]  [employer] phone number [phone].   I will continue to follow until final determination.  Jinger Neighbors, CPhT-Adv Oncology Pharmacy Patient Advocate [facility] Direct Number: [phone]  Fax: [phone]

## 2023-04-09 ENCOUNTER — Other Ambulatory Visit: Payer: Self-pay | Admitting: Family

## 2023-04-10 ENCOUNTER — Other Ambulatory Visit: Payer: Self-pay | Admitting: Family

## 2023-04-10 DIAGNOSIS — M79605 Pain in left leg: Secondary | ICD-10-CM

## 2023-04-10 NOTE — Telephone Encounter (Signed)
Patient only given 70month and 1 week supply.

## 2023-04-11 NOTE — Telephone Encounter (Signed)
Oral Oncology Patient Advocate Encounter   Submitted application for assistance for Verzenio to [place] [employer].   Application submitted via e-fax to [phone]   [facility] phone number [phone].   I will continue to check the status until final determination.   Jinger Neighbors, CPhT-Adv Oncology Pharmacy Patient Advocate [facility] Direct Number: [phone]  Fax: [phone] '

## 2023-04-13 ENCOUNTER — Ambulatory Visit: Payer: Medicare Other | Attending: Family

## 2023-04-17 NOTE — Telephone Encounter (Signed)
 Oral Oncology Patient Advocate Encounter   Received notification re-enrollment for assistance for Verzenio through [employer] has been approved. Patient may continue to receive their medication at $0 from this program.    [employer] phone number [phone].   Effective dates: 06/07/23 through 06/05/24  I have spoken to the patient.  Jinger Neighbors, CPhT-Adv Oncology Pharmacy Patient Advocate [facility] Direct Number: [phone]  Fax: [phone]

## 2023-04-18 ENCOUNTER — Ambulatory Visit
Admission: RE | Admit: 2023-04-18 | Discharge: 2023-04-18 | Disposition: A | Discharge status: Home / Self Care | Payer: Medicare Other | Source: Ambulatory Visit | Attending: Family | Admitting: Family

## 2023-04-20 ENCOUNTER — Ambulatory Visit: Payer: Medicare Other

## 2023-04-25 ENCOUNTER — Other Ambulatory Visit: Payer: Self-pay

## 2023-04-25 ENCOUNTER — Telehealth: Payer: Self-pay | Admitting: Cardiology

## 2023-04-25 DIAGNOSIS — C50919 Malignant neoplasm of unspecified site of unspecified female breast: Secondary | ICD-10-CM

## 2023-04-25 NOTE — Telephone Encounter (Signed)
Patient called to let PharmD know that she had her lipid panel done last month.  She wants to know if prior Berkley Harvey will be done again for her cholesterol medication.

## 2023-04-26 ENCOUNTER — Ambulatory Visit: Payer: Medicare Other | Admitting: Podiatry

## 2023-04-26 ENCOUNTER — Inpatient Hospital Stay: Payer: Medicare Other | Admitting: Hematology

## 2023-04-26 ENCOUNTER — Inpatient Hospital Stay: Payer: Medicare Other

## 2023-04-26 ENCOUNTER — Inpatient Hospital Stay: Payer: Medicare Other | Attending: Hematology

## 2023-04-26 ENCOUNTER — Encounter: Payer: Self-pay | Admitting: Podiatry

## 2023-04-26 ENCOUNTER — Other Ambulatory Visit: Payer: Self-pay | Admitting: Hematology

## 2023-04-26 VITALS — BP 122/61 | HR 87 | Temp 97.3°F | Resp 18 | Wt 199.5 lb

## 2023-04-26 VITALS — Ht 67.0 in | Wt 200.0 lb

## 2023-04-26 DIAGNOSIS — M65972 Unspecified synovitis and tenosynovitis, left ankle and foot: Secondary | ICD-10-CM

## 2023-04-26 DIAGNOSIS — Z79899 Other long term (current) drug therapy: Secondary | ICD-10-CM | POA: Diagnosis not present

## 2023-04-26 DIAGNOSIS — Z87891 Personal history of nicotine dependence: Secondary | ICD-10-CM | POA: Insufficient documentation

## 2023-04-26 DIAGNOSIS — C50919 Malignant neoplasm of unspecified site of unspecified female breast: Secondary | ICD-10-CM

## 2023-04-26 DIAGNOSIS — C7800 Secondary malignant neoplasm of unspecified lung: Secondary | ICD-10-CM | POA: Insufficient documentation

## 2023-04-26 DIAGNOSIS — R197 Diarrhea, unspecified: Secondary | ICD-10-CM | POA: Diagnosis not present

## 2023-04-26 DIAGNOSIS — C50511 Malignant neoplasm of lower-outer quadrant of right female breast: Secondary | ICD-10-CM | POA: Insufficient documentation

## 2023-04-26 DIAGNOSIS — Z170 Estrogen receptor positive status [ER+]: Secondary | ICD-10-CM | POA: Diagnosis not present

## 2023-04-26 DIAGNOSIS — E876 Hypokalemia: Secondary | ICD-10-CM | POA: Diagnosis not present

## 2023-04-26 DIAGNOSIS — Z5111 Encounter for antineoplastic chemotherapy: Secondary | ICD-10-CM | POA: Diagnosis not present

## 2023-04-26 DIAGNOSIS — M7752 Other enthesopathy of left foot: Secondary | ICD-10-CM | POA: Diagnosis not present

## 2023-04-26 DIAGNOSIS — Z79818 Long term (current) use of other agents affecting estrogen receptors and estrogen levels: Secondary | ICD-10-CM | POA: Insufficient documentation

## 2023-04-26 LAB — CMP (CANCER CENTER ONLY)
ALT: 90 U/L — ABNORMAL HIGH (ref 0–44)
AST: 84 U/L — ABNORMAL HIGH (ref 15–41)
Albumin: 3.7 g/dL (ref 3.5–5.0)
Alkaline Phosphatase: 277 U/L — ABNORMAL HIGH (ref 38–126)
Anion gap: 6 (ref 5–15)
BUN: 22 mg/dL — ABNORMAL HIGH (ref 6–20)
CO2: 30 mmol/L (ref 22–32)
Calcium: 9.6 mg/dL (ref 8.9–10.3)
Chloride: 106 mmol/L (ref 98–111)
Creatinine: 1.5 mg/dL — ABNORMAL HIGH (ref 0.44–1.00)
GFR, Estimated: 40 mL/min — ABNORMAL LOW (ref >60)
Glucose, Bld: 98 mg/dL (ref 70–99)
Potassium: 3.8 mmol/L (ref 3.5–5.1)
Sodium: 142 mmol/L (ref 135–145)
Total Bilirubin: 0.8 mg/dL (ref <1.2)
Total Protein: 7.4 g/dL (ref 6.5–8.1)

## 2023-04-26 LAB — CBC WITH DIFFERENTIAL (CANCER CENTER ONLY)
Abs Immature Granulocytes: 0 10*3/uL (ref 0.00–0.07)
Basophils Absolute: 0 10*3/uL (ref 0.0–0.1)
Basophils Relative: 1 %
Eosinophils Absolute: 0.1 10*3/uL (ref 0.0–0.5)
Eosinophils Relative: 1 %
HCT: 36.6 % (ref 36.0–46.0)
Hemoglobin: 12.3 g/dL (ref 12.0–15.0)
Immature Granulocytes: 0 %
Lymphocytes Relative: 63 %
Lymphs Abs: 2.4 10*3/uL (ref 0.7–4.0)
MCH: 35.7 pg — ABNORMAL HIGH (ref 26.0–34.0)
MCHC: 33.6 g/dL (ref 30.0–36.0)
MCV: 106.1 fL — ABNORMAL HIGH (ref 80.0–100.0)
Monocytes Absolute: 0.2 10*3/uL (ref 0.1–1.0)
Monocytes Relative: 6 %
Neutro Abs: 1.1 10*3/uL — ABNORMAL LOW (ref 1.7–7.7)
Neutrophils Relative %: 29 %
Platelet Count: 186 10*3/uL (ref 150–400)
RBC: 3.45 MIL/uL — ABNORMAL LOW (ref 3.87–5.11)
RDW: 14.3 % (ref 11.5–15.5)
WBC Count: 3.8 10*3/uL — ABNORMAL LOW (ref 4.0–10.5)
nRBC: 0 % (ref 0.0–0.2)

## 2023-04-26 MED ORDER — DIPHENHYDRAMINE HCL 25 MG PO CAPS: 25.0000 mg | ORAL_CAPSULE | Freq: Once | ORAL | Status: DC

## 2023-04-26 MED ORDER — FAMOTIDINE 20 MG PO TABS: 20.0000 mg | ORAL_TABLET | Freq: Once | ORAL | Status: DC

## 2023-04-26 MED ORDER — BETAMETHASONE SOD PHOS & ACET 6 (3-3) MG/ML IJ SUSP
3.0000 mg | Freq: Once | INTRAMUSCULAR | Status: AC
  Administered 2023-04-26: 3 mg via INTRA_ARTICULAR

## 2023-04-26 MED ORDER — FULVESTRANT 250 MG/5ML IM SOSY
500.0000 mg | PREFILLED_SYRINGE | Freq: Once | INTRAMUSCULAR | Status: AC
  Administered 2023-04-26: 500 mg via INTRAMUSCULAR
  Filled 2023-04-26: qty 10

## 2023-04-26 MED ORDER — IBUPROFEN 200 MG PO TABS: 400.0000 mg | ORAL_TABLET | Freq: Once | ORAL | Status: DC
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
## 2023-04-26 NOTE — Progress Notes (Signed)
Chief Complaint  Patient presents with   Foot Pain    Patient is here for left foot pain    Subjective:  57 y.o. female presenting today as a new patient for evaluation of pain to the left ankle ongoing for several years.  Idiopathic gradual onset.  Patient states that it has been ongoing for several decades.  She has a history of chronic pain to the left ankle.  She has not seen by for treatment.  Past Medical History:  Diagnosis Date   Acute pansinusitis 08/02/2017   Anxiety    Arthritis    ASD (atrial septal defect)    s/p closure with Amplatzer device 10/05/04 (Dr. Celso Amy, [facility]) 10/05/04   Cancer [facility])    Cataract    Chronic pain    CKD (chronic kidney disease)    Dyspnea    Fatty liver 09/04/2019   GERD (gastroesophageal reflux disease)    Heart murmur    no longer heard   History of hiatal hernia    Hyperlipidemia    Legally blind in right eye, as defined in [place]    Lumbar herniated disc    Migraines    On home oxygen therapy 06/15/2022   Pt was given O2 on D/C from [facility] 04/2022   OSA on CPAP 06/15/2022   PONV (postoperative nausea and vomiting)    Sciatica     Past Surgical History:  Procedure Laterality Date   ABLATION     BREAST LUMPECTOMY WITH RADIOACTIVE SEED AND SENTINEL LYMPH NODE BIOPSY Right 11/23/2020   Procedure: RIGHT BREAST LUMPECTOMY WITH RADIOACTIVE SEED AND RIGHT AXILLARY SENTINEL LYMPH NODE BIOPSY;  Surgeon: Emelia Loron, MD;  Location: [facility] OR;  Service: General;  Laterality: Right;   BREAST SURGERY Bilateral 2011   Breast Reduction Surgery   BUNIONECTOMY     CARDIAC CATHETERIZATION     10/05/04 [facility]): LM < 25%, otherwise normal coronaries. No pulmonary HTN, Mildly enlarged RV. Secundum ASD s/p closure.   CARDIAC SURGERY     CATARACT EXTRACTION     CLEFT PALATE REPAIR     s/p cleft lip and palate repair   EYE SURGERY Right 2019   right eye removed   LUMBAR LAMINECTOMY/DECOMPRESSION MICRODISCECTOMY Left 05/23/2016   Procedure:  LEFT L4-L5 LATERAL RECESS DECOMPRESSION WITH CENTRAL AND RIGHT DECOMPRESSION VIA LEFT SIDE;  Surgeon: Kerrin Champagne, MD;  Location: [facility] OR;  Service: Orthopedics;  Laterality: Left;   LUMBAR LAMINECTOMY/DECOMPRESSION MICRODISCECTOMY Left 05/23/2016   Procedure: LUMBAR LAMINECTOMY/DECOMPRESSION MICRODISCECTOMY Lumbar five - Sacral One 1 LEVEL;  Surgeon: Kerrin Champagne, MD;  Location: [facility] OR;  Service: Orthopedics;  Laterality: Left;   REDUCTION MAMMAPLASTY  2011   SHOULDER INJECTION Left 05/23/2016   Procedure: SHOULDER INJECTION;  Surgeon: Kerrin Champagne, MD;  [facility];  Service: Orthopedics;  Laterality: Left;  band-aid per pa-c   TRANSTHORACIC ECHOCARDIOGRAM     12/15/05 [facility]): Mild LVH, EF > 55%, grade 1 diastolic dysfunction, Trivial MR/PR/TR.   TUBAL LIGATION      Allergies  Allergen Reactions   Zithromax [Azithromycin] Shortness Of Breath, Itching and Other (See Comments)    TOTAL BODY ITCHING [EVEN SOLES OF FEET] WHEEZING    Tramadol Itching and Other (See Comments)    Has taken recently without any side effects.   Psyllium Nausea And Vomiting and Other (See Comments)    Metamucil. Sneezing      Objective / Physical Exam:  General:  The patient is alert and  oriented x3 in no acute distress. Dermatology:  Skin is warm, dry and supple bilateral lower extremities. Negative for open lesions or macerations. Vascular:  Palpable pedal pulses bilaterally. No edema or erythema noted. Capillary refill within normal limits. Neurological:  Light touch and protective threshold grossly intact bilaterally.  Musculoskeletal Exam:  Pain on palpation to the anterior lateral medial aspects of the patient's left ankle. Mild edema noted. Range of motion within normal limits to all pedal and ankle joints bilateral. Muscle strength 5/5 in all groups bilateral.   DG Ankle Complete Left 03/24/2023 IMPRESSION: 1. Mild talonavicular and navicular-cuneiform osteoarthritis. 2. Mild chronic  enthesopathic change at the Achilles insertion on the calcaneus.  Assessment: 1.  Capsulitis left ankle 2.  Pes planovalgus deformity bilateral  Plan of Care:  -Patient was evaluated. X-Rays reviewed.  -Injection of 0.5 mL Celestone Soluspan injected in the patient's left ankle lateral aspect. -Ankle brace dispensed.  Wear daily -History of CKD.  No NSAIDs -Advise against when barefoot -Return to clinic 3 months routine footcare with Dr. Vallery Sa, DPM [facility]  Dr. Felecia Shelling, DPM    [address] [place], [place] [address]                Office [phone]  Fax [phone]

## 2023-04-26 NOTE — Progress Notes (Signed)
Pt reports taking premeds for Faslodex (advil, pepcid, and benadryl per [facility]) while at MD appt. Reports Dr. Candise Che watched her take the meds approx 30-45 mins ago.

## 2023-04-27 ENCOUNTER — Encounter (HOSPITAL_BASED_OUTPATIENT_CLINIC_OR_DEPARTMENT_OTHER): Payer: Self-pay | Admitting: Pulmonary Disease

## 2023-04-27 ENCOUNTER — Ambulatory Visit: Payer: Medicare Other

## 2023-04-27 DIAGNOSIS — J9611 Chronic respiratory failure with hypoxia: Secondary | ICD-10-CM

## 2023-04-28 ENCOUNTER — Ambulatory Visit: Payer: Medicare Other

## 2023-04-28 ENCOUNTER — Other Ambulatory Visit: Payer: Medicare Other

## 2023-05-01 NOTE — Telephone Encounter (Signed)
Order place to d/c O2.

## 2023-05-01 NOTE — Telephone Encounter (Signed)
OK to discontinue O2

## 2023-05-02 ENCOUNTER — Encounter: Payer: Self-pay | Admitting: Hematology

## 2023-05-03 ENCOUNTER — Ambulatory Visit: Payer: Medicare Other | Admitting: Physical Therapy

## 2023-05-05 ENCOUNTER — Other Ambulatory Visit: Payer: Self-pay | Admitting: Adult Health

## 2023-05-05 DIAGNOSIS — F5101 Primary insomnia: Secondary | ICD-10-CM

## 2023-05-08 NOTE — Telephone Encounter (Signed)
Patient has request refill on medication Xanax. Patient medication pend and sent to PCP Nget[place], Donalee Citrin, NP

## 2023-05-09 ENCOUNTER — Encounter: Payer: Self-pay | Admitting: Pharmacist Clinician (PhC)/ Clinical Pharmacy Specialist

## 2023-05-09 ENCOUNTER — Encounter (HOSPITAL_BASED_OUTPATIENT_CLINIC_OR_DEPARTMENT_OTHER): Payer: Self-pay | Admitting: Pulmonary Disease

## 2023-05-11 ENCOUNTER — Other Ambulatory Visit: Payer: Self-pay

## 2023-05-11 MED ORDER — EZETIMIBE 10 MG PO TABS: 10.0000 mg | ORAL_TABLET | Freq: Every day | ORAL | 2 refills | Status: DC

## 2023-05-12 ENCOUNTER — Other Ambulatory Visit: Payer: Self-pay | Admitting: Pharmacist Clinician (PhC)/ Clinical Pharmacy Specialist

## 2023-05-12 DIAGNOSIS — E782 Mixed hyperlipidemia: Secondary | ICD-10-CM

## 2023-05-16 ENCOUNTER — Encounter: Payer: Medicare Other | Admitting: Student in an Organized Health Care Education/Training Program

## 2023-05-19 ENCOUNTER — Ambulatory Visit (HOSPITAL_COMMUNITY)
Admission: RE | Admit: 2023-05-19 | Discharge: 2023-05-19 | Disposition: A | Discharge status: Home / Self Care | Payer: Medicare Other | Source: Ambulatory Visit | Attending: Hematology | Admitting: Hematology

## 2023-05-19 DIAGNOSIS — Z9889 Other specified postprocedural states: Secondary | ICD-10-CM | POA: Diagnosis not present

## 2023-05-19 DIAGNOSIS — C50919 Malignant neoplasm of unspecified site of unspecified female breast: Secondary | ICD-10-CM | POA: Diagnosis present

## 2023-05-19 DIAGNOSIS — C799 Secondary malignant neoplasm of unspecified site: Secondary | ICD-10-CM | POA: Diagnosis not present

## 2023-05-19 DIAGNOSIS — C50911 Malignant neoplasm of unspecified site of right female breast: Secondary | ICD-10-CM | POA: Insufficient documentation

## 2023-05-24 ENCOUNTER — Other Ambulatory Visit: Payer: Self-pay | Admitting: Hematology

## 2023-05-24 DIAGNOSIS — C50919 Malignant neoplasm of unspecified site of unspecified female breast: Secondary | ICD-10-CM

## 2023-05-25 ENCOUNTER — Other Ambulatory Visit: Payer: Self-pay

## 2023-05-25 ENCOUNTER — Encounter: Payer: Medicare Other | Admitting: Student in an Organized Health Care Education/Training Program

## 2023-05-25 DIAGNOSIS — C50919 Malignant neoplasm of unspecified site of unspecified female breast: Secondary | ICD-10-CM

## 2023-05-26 ENCOUNTER — Inpatient Hospital Stay: Payer: Medicare Other | Admitting: Hematology

## 2023-05-26 ENCOUNTER — Inpatient Hospital Stay: Payer: Medicare Other | Attending: Hematology

## 2023-05-26 ENCOUNTER — Inpatient Hospital Stay: Payer: Medicare Other

## 2023-05-26 VITALS — BP 145/64 | HR 95 | Temp 97.0°F | Resp 18 | Wt 203.5 lb

## 2023-05-26 DIAGNOSIS — C50919 Malignant neoplasm of unspecified site of unspecified female breast: Secondary | ICD-10-CM

## 2023-05-26 DIAGNOSIS — Z5111 Encounter for antineoplastic chemotherapy: Secondary | ICD-10-CM | POA: Diagnosis not present

## 2023-05-26 DIAGNOSIS — E876 Hypokalemia: Secondary | ICD-10-CM | POA: Diagnosis not present

## 2023-05-26 DIAGNOSIS — Z170 Estrogen receptor positive status [ER+]: Secondary | ICD-10-CM | POA: Diagnosis not present

## 2023-05-26 DIAGNOSIS — C50511 Malignant neoplasm of lower-outer quadrant of right female breast: Secondary | ICD-10-CM | POA: Insufficient documentation

## 2023-05-26 DIAGNOSIS — C7800 Secondary malignant neoplasm of unspecified lung: Secondary | ICD-10-CM | POA: Insufficient documentation

## 2023-05-26 DIAGNOSIS — Z79899 Other long term (current) drug therapy: Secondary | ICD-10-CM | POA: Diagnosis not present

## 2023-05-26 DIAGNOSIS — Z79818 Long term (current) use of other agents affecting estrogen receptors and estrogen levels: Secondary | ICD-10-CM | POA: Insufficient documentation

## 2023-05-26 DIAGNOSIS — Z87891 Personal history of nicotine dependence: Secondary | ICD-10-CM | POA: Insufficient documentation

## 2023-05-26 LAB — CBC WITH DIFFERENTIAL (CANCER CENTER ONLY)
Abs Immature Granulocytes: 0 10*3/uL (ref 0.00–0.07)
Basophils Absolute: 0 10*3/uL (ref 0.0–0.1)
Basophils Relative: 1 %
Eosinophils Absolute: 0 10*3/uL (ref 0.0–0.5)
Eosinophils Relative: 1 %
HCT: 33.1 % — ABNORMAL LOW (ref 36.0–46.0)
Hemoglobin: 11.7 g/dL — ABNORMAL LOW (ref 12.0–15.0)
Immature Granulocytes: 0 %
Lymphocytes Relative: 65 %
Lymphs Abs: 2 10*3/uL (ref 0.7–4.0)
MCH: 38.6 pg — ABNORMAL HIGH (ref 26.0–34.0)
MCHC: 35.3 g/dL (ref 30.0–36.0)
MCV: 109.2 fL — ABNORMAL HIGH (ref 80.0–100.0)
Monocytes Absolute: 0.3 10*3/uL (ref 0.1–1.0)
Monocytes Relative: 8 %
Neutro Abs: 0.8 10*3/uL — ABNORMAL LOW (ref 1.7–7.7)
Neutrophils Relative %: 25 %
Platelet Count: 179 10*3/uL (ref 150–400)
RBC: 3.03 MIL/uL — ABNORMAL LOW (ref 3.87–5.11)
RDW: 15.9 % — ABNORMAL HIGH (ref 11.5–15.5)
WBC Count: 3.1 10*3/uL — ABNORMAL LOW (ref 4.0–10.5)
nRBC: 0 % (ref 0.0–0.2)

## 2023-05-26 LAB — CMP (CANCER CENTER ONLY)
ALT: 54 U/L — ABNORMAL HIGH (ref 0–44)
AST: 67 U/L — ABNORMAL HIGH (ref 15–41)
Albumin: 3.5 g/dL (ref 3.5–5.0)
Alkaline Phosphatase: 239 U/L — ABNORMAL HIGH (ref 38–126)
Anion gap: 5 (ref 5–15)
BUN: 22 mg/dL — ABNORMAL HIGH (ref 6–20)
CO2: 30 mmol/L (ref 22–32)
Calcium: 9.2 mg/dL (ref 8.9–10.3)
Chloride: 104 mmol/L (ref 98–111)
Creatinine: 1.33 mg/dL — ABNORMAL HIGH (ref 0.44–1.00)
GFR, Estimated: 46 mL/min — ABNORMAL LOW (ref >60)
Glucose, Bld: 86 mg/dL (ref 70–99)
Potassium: 4.3 mmol/L (ref 3.5–5.1)
Sodium: 139 mmol/L (ref 135–145)
Total Bilirubin: 0.8 mg/dL (ref <1.2)
Total Protein: 6.6 g/dL (ref 6.5–8.1)

## 2023-05-26 MED ORDER — FULVESTRANT 250 MG/5ML IM SOSY
500.0000 mg | PREFILLED_SYRINGE | Freq: Once | INTRAMUSCULAR | Status: AC
  Administered 2023-05-26: 500 mg via INTRAMUSCULAR
  Filled 2023-05-26: qty 10
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
## 2023-05-30 DIAGNOSIS — E782 Mixed hyperlipidemia: Secondary | ICD-10-CM | POA: Diagnosis not present

## 2023-05-31 ENCOUNTER — Encounter: Payer: Self-pay | Admitting: Hematology

## 2023-05-31 LAB — LIPID PANEL
Chol/HDL Ratio: 2.9 {ratio} (ref 0.0–4.4)
Cholesterol, Total: 356 mg/dL — ABNORMAL HIGH (ref 100–199)
HDL: 122 mg/dL (ref >39)
LDL Chol Calc (NIH): 189 mg/dL — ABNORMAL HIGH (ref 0–99)
Triglycerides: 243 mg/dL — ABNORMAL HIGH (ref 0–149)
VLDL Cholesterol Cal: 45 mg/dL — ABNORMAL HIGH (ref 5–40)

## 2023-06-13 ENCOUNTER — Ambulatory Visit
Payer: Medicare Other | Attending: Student in an Organized Health Care Education/Training Program | Admitting: Student in an Organized Health Care Education/Training Program

## 2023-06-13 ENCOUNTER — Other Ambulatory Visit: Payer: Self-pay

## 2023-06-13 ENCOUNTER — Encounter: Payer: Self-pay | Admitting: Student in an Organized Health Care Education/Training Program

## 2023-06-13 VITALS — BP 126/66 | HR 85 | Temp 97.2°F | Resp 16 | Ht 67.0 in | Wt 200.0 lb

## 2023-06-13 DIAGNOSIS — M961 Postlaminectomy syndrome, not elsewhere classified: Secondary | ICD-10-CM | POA: Diagnosis not present

## 2023-06-13 DIAGNOSIS — M47817 Spondylosis without myelopathy or radiculopathy, lumbosacral region: Secondary | ICD-10-CM | POA: Diagnosis not present

## 2023-06-13 DIAGNOSIS — M4310 Spondylolisthesis, site unspecified: Secondary | ICD-10-CM | POA: Diagnosis not present

## 2023-06-13 DIAGNOSIS — M47816 Spondylosis without myelopathy or radiculopathy, lumbar region: Secondary | ICD-10-CM | POA: Diagnosis not present

## 2023-06-13 DIAGNOSIS — G894 Chronic pain syndrome: Secondary | ICD-10-CM | POA: Insufficient documentation

## 2023-06-13 MED ORDER — BELBUCA 300 MCG BU FILM: 1.0000 | ORAL_FILM | Freq: Two times a day (BID) | BUCCAL | 2 refills | Status: DC

## 2023-06-13 NOTE — Progress Notes (Signed)
 Nursing Pain Medication Assessment:  Safety precautions to be maintained throughout the outpatient stay will include: orient to surroundings, keep bed in low position, maintain call bell within reach at all times, provide assistance with transfer out of bed and ambulation.  Medication Inspection Compliance: Pill count conducted under aseptic conditions, in front of the patient. Neither the pills nor the bottle was removed from the patient's sight at any time. Once count was completed pills were immediately returned to the patient in their original bottle.  Medication: Hydrocodone /APAP Pill/Patch Count:  35 of 60 pills remain Pill/Patch Appearance: Markings consistent with prescribed medication Bottle Appearance: Standard pharmacy container. Clearly labeled. Filled Date: 106 / 18 / 2024 Last Medication intake:  Yesterday

## 2023-06-13 NOTE — Progress Notes (Signed)
 Contacted Brandy [name] per Dr Onesimo to : let patient know his CT CAP -- showed -- No evidence of recurrent or metastatic disease. Stable apical lung  nodules, benign.  Continuing current treatment plan unchanged.  Brandy [name] acknowledged information and verbalized understanding.

## 2023-06-13 NOTE — Patient Instructions (Signed)
 ______________________________________________________________________    Preparing for your procedure  Appointments: If you think you may not be able to keep your appointment, call 24-48 hours in advance to cancel. We need time to make it available to others.  Procedure visits are for procedures only. During your procedure appointment there will be: NO Prescription Refills*. NO medication changes or discussions*. NO discussion of disability issues*. NO unrelated pain problem evaluations*. NO evaluations to order other pain procedures*. *These will be addressed at a separate and distinct evaluation encounter on the provider's evaluation schedule and not during procedure days.  Instructions: Food intake: Avoid eating anything solid for at least 8 hours prior to your procedure. Clear liquid intake: You may take clear liquids such as water up to 2 hours prior to your procedure. (No carbonated drinks. No soda.) Transportation: Unless otherwise stated by your physician, bring a driver. (Driver cannot be a Market researcher, Pharmacist, community, or any other form of public transportation.) Morning Medicines: Except for blood thinners, take all of your other morning medications with a sip of water. Make sure to take your heart and blood pressure medicines. If your blood pressure's lower number is above 100, the case will be rescheduled. Blood thinners: Make sure to stop your blood thinners as instructed.  If you take a blood thinner, but were not instructed to stop it, call our office [phone] and ask to talk to a nurse. Not stopping a blood thinner prior to certain procedures could lead to serious complications. Diabetics on insulin: Notify the staff so that you can be scheduled 1st case in the morning. If your diabetes requires high dose insulin, take only  of your normal insulin dose the morning of the procedure and notify the staff that you have done so. Preventing infections: Shower with an antibacterial soap the  morning of your procedure.  Build-up your immune system: Take 1000 mg of Vitamin C with every meal (3 times a day) the day prior to your procedure. Antibiotics: Inform the nursing staff if you are taking any antibiotics or if you have any conditions that may require antibiotics prior to procedures. (Example: recent joint implants)   Pregnancy: If you are pregnant make sure to notify the nursing staff. Not doing so may result in injury to the fetus, including death.  Sickness: If you have a cold, fever, or any active infections, call and cancel or reschedule your procedure. Receiving steroids while having an infection may result in complications. Arrival: You must be in the facility at least 30 minutes prior to your scheduled procedure. Tardiness: Your scheduled time is also the cutoff time. If you do not arrive at least 15 minutes prior to your procedure, you will be rescheduled.  Children: Do not bring any children with you. Make arrangements to keep them home. Dress appropriately: There is always a possibility that your clothing may get soiled. Avoid long dresses. Valuables: Do not bring any jewelry or valuables.  Reasons to call and reschedule or cancel your procedure: (Following these recommendations will minimize the risk of a serious complication.) Surgeries: Avoid having procedures within 2 weeks of any surgery. (Avoid for 2 weeks before or after any surgery). Flu Shots: Avoid having procedures within 2 weeks of a flu shots or . (Avoid for 2 weeks before or after immunizations). Barium: Avoid having a procedure within 7-10 days after having had a radiological study involving the use of radiological contrast. (Myelograms, Barium swallow or enema study). Heart attacks: Avoid any elective procedures or surgeries for the  initial 6 months after a "Myocardial Infarction" (Heart Attack). Blood thinners: It is imperative that you stop these medications before procedures. Let us know if you if you take  any blood thinner.  Infection: Avoid procedures during or within two weeks of an infection (including chest colds or gastrointestinal problems). Symptoms associated with infections include: Localized redness, fever, chills, night sweats or profuse sweating, burning sensation when voiding, cough, congestion, stuffiness, runny nose, sore throat, diarrhea, nausea, vomiting, cold or Flu symptoms, recent or current infections. It is specially important if the infection is over the area that we intend to treat. Heart and lung problems: Symptoms that may suggest an active cardiopulmonary problem include: cough, chest pain, breathing difficulties or shortness of breath, dizziness, ankle swelling, uncontrolled high or unusually low blood pressure, and/or palpitations. If you are experiencing any of these symptoms, cancel your procedure and contact your primary care physician for an evaluation.  Remember:  Regular Business hours are:  Monday to Thursday 8:00 AM to 4:00 PM  Provider's Schedule: Delano Metz, MD:  Procedure days: Tuesday and Thursday 7:30 AM to 4:00 PM  Edward Jolly, MD:  Procedure days: Monday and Wednesday 7:30 AM to 4:00 PM Last  Updated: 05/16/2023 ______________________________________________________________________    Radiofrequency Ablation Radiofrequency ablation is a procedure that is performed to relieve pain. The procedure is often used for back, neck, or arm pain. Radiofrequency ablation involves the use of a machine that creates radio waves to make heat. During the procedure, the heat is applied to the nerve that carries the pain signal. The heat damages the nerve and interferes with the pain signal. Pain relief usually starts about 2 weeks after the procedure and lasts for 6 months to 1 year. Tell a health care provider about: Any allergies you have. All medicines you are taking, including vitamins, herbs, eye drops, creams, and over-the-counter medicines. Any problems  you or family members have had with anesthetic medicines. Any bleeding problems you have. Any surgeries you have had. Any medical conditions you have. Whether you are pregnant or may be pregnant. What are the risks? Generally, this is a safe procedure. However, problems may occur, including: Pain or soreness at the injection site. Allergic reaction to medicines given during the procedure. Bleeding. Infection at the injection site. Damage to nerves or blood vessels. What happens before the procedure? When to stop eating and drinking Follow instructions from your health care provider about what you may eat and drink before your procedure. These may include: 8 hours before the procedure Stop eating most foods. Do not eat meat, fried foods, or fatty foods. Eat only light foods, such as toast or crackers. All liquids are okay except energy drinks and alcohol. 6 hours before the procedure Stop eating. Drink only clear liquids, such as water, clear fruit juice, black coffee, plain tea, and sports drinks. Do not drink energy drinks or alcohol. 2 hours before the procedure Stop drinking all liquids. You may be allowed to take medicine with small sips of water. If you do not follow your health care provider's instructions, your procedure may be delayed or canceled. Medicines Ask your health care provider about: Changing or stopping your regular medicines. This is especially important if you are taking diabetes medicines or blood thinners. Taking medicines such as aspirin and ibuprofen. These medicines can thin your blood. Do not take these medicines unless your health care provider tells you to take them. Taking over-the-counter medicines, vitamins, herbs, and supplements. General instructions Ask your health care  provider what steps will be taken to help prevent infection. These steps may include: Removing hair at the procedure site. Washing skin with a germ-killing soap. Taking antibiotic  medicine. If you will be going home right after the procedure, plan to have a responsible adult: Take you home from the hospital or clinic. You will not be allowed to drive. Care for you for the time you are told. What happens during the procedure?  You will be awake during the procedure. You will need to be able to talk with the health care provider during the procedure. An IV will be inserted into one of your veins. You will be given one or more of the following: A medicine to help you relax (sedative). A medicine to numb the area (local anesthetic). Your health care provider will insert a radiofrequency needle into the area to be treated. This is done with the help of fluoroscopy. A wire that carries the radio waves (electrode) will be put through the radiofrequency needle. An electrical pulse will be sent through the electrode to verify the correct nerve that is causing your pain. You will feel a tingling sensation, and you may have muscle twitching. The tissue around the needle tip will be heated by an electric current that comes from the radiofrequency machine. This will numb the nerves. The needle will be removed. A bandage (dressing) will be put on the insertion area. The procedure may vary among health care providers and hospitals. What happens after the procedure? Your blood pressure, heart rate, breathing rate, and blood oxygen level will be monitored until you leave the hospital or clinic. Return to your normal activities as told by your health care provider. Ask your health care provider what activities are safe for you. If you were given a sedative during the procedure, it can affect you for several hours. Do not drive or operate machinery until your health care provider says that it is safe. Summary Radiofrequency ablation is a procedure that is performed to relieve pain. The procedure is often used for back, neck, or arm pain. Radiofrequency ablation involves the use of a  machine that creates radio waves to make heat. Plan to have a responsible adult take you home from the hospital or clinic. Do not drive or operate machinery until your health care provider says that it is safe. Return to your normal activities as told by your health care provider. Ask your health care provider what activities are safe for you. This information is not intended to replace advice given to you by your health care provider. Make sure you discuss any questions you have with your health care provider. Document Revised: 11/10/2020 Document Reviewed: 11/10/2020 [employer] Patient Education  2024 [employer].

## 2023-06-13 NOTE — Progress Notes (Signed)
 PROVIDER NOTE: Information contained herein reflects review and annotations entered in association with encounter. Interpretation of such information and data should be left to medically-trained personnel. Information provided to patient can be located elsewhere in the medical record under Patient Instructions. Document created using STT-dictation technology, any transcriptional errors that may result from process are unintentional.    Patient: Brandy [name]   Service Category: E/M  Provider: Wallie Sherry, MD  DOB: 1965-12-08  DOS: 06/13/2023  Referring Provider: Leonarda Roxan BROCKS, NP  MRN: [id number]  Specialty: Interventional Pain Management  PCP: Leonarda Roxan BROCKS, NP  Type: Established Patient  Setting: Ambulatory outpatient    Location: Office  Delivery: Face-to-face     HPI  [name] , a 58 y.o. year old female, is here today because of her Chronic pain syndrome [G89.4]. [name] 's primary complain today is Back Pain (lower) and Foot Pain (left) Last encounter: My last encounter with her was on 02/28/23 Pertinent problems: [name]  has Spinal stenosis, lumbar region with neurogenic claudication; Post laminectomy syndrome; CKD (chronic kidney disease), stage III (HCC); Depression; Chronic pain syndrome; DDD (degenerative disc disease), lumbosacral; Grade 1 Retrolisthesis (L4-5, L5-S1); Lumbar facet syndrome (Multilevel) (Bilateral) (R>L); Neurogenic pain; and Spondylosis without myelopathy or radiculopathy, lumbosacral region on their pertinent problem list. Pain Assessment: Severity of Chronic pain is reported as a 8 /10. Location: Back Lower/down left leg to befind knee. Onset:  . Quality: Aching, Burning. Timing: Constant. Modifying factor(s): states vicodin not as effective as belbuca  was. Vitals:  height is 5' 7 (1.702 m) and weight is 200 lb (90.7 kg). Her temperature is 97.2 F (36.2 C) (abnormal). Her blood pressure is 126/66  and her pulse is 85. Her respiration is 16 and oxygen  saturation is 99%.   Reason for encounter: medication management.  Discussed the use of AI scribe software for clinical note transcription with the patient, who gave verbal consent to proceed.  History of Present Illness   The patient, with a history of chronic pain, was previously managed with Belbuca , which she reported as being superior to oxycodone . However, due to financial constraints, she had to switch to oxycodone . She is now in a position to return to Belbuca  and has requested a prescription refill for the 15th of the month, as she has enough oxycodone  to last until then.  The patient also reported a recent fall resulting in four fractures in her left ankle. She is currently wearing a brace, but the duration of this treatment is unclear. She reported that the pain persists despite a cortisone shot, which only provided temporary relief.  The patient's mobility is further compromised by her living situation. Her mailbox is a couple of hundred feet away from her apartment, and the slight incline to reach it exacerbates her back pain. As a result, she has limited her trips to the mailbox to once a week or every other week.  The patient has previously benefited from lumbar RFA for her low back and leg pain. She had a lumbar spinal ablation last year, which provided relief until November. She is open to exploring this option again once her ankle has healed.       ROS  Constitutional: Denies any fever or chills Gastrointestinal: No reported hemesis, hematochezia, vomiting, or acute GI distress Musculoskeletal:   low back pain Neurological: No reported episodes of acute onset apraxia, aphasia, dysarthria, agnosia, amnesia, paralysis, loss of coordination, or loss of consciousness  Medication Review  ALPRAZolam , Buprenorphine   HCl, Lifitegrast , PRESCRIPTION MEDICATION, Polyethyl Glycol-Propyl Glycol, Vitamin D  (Ergocalciferol ), abemaciclib ,  acetaminophen , albuterol , amitriptyline , cyanocobalamin , diphenoxylate -atropine , ezetimibe , fluticasone -salmeterol, ketoconazole , loperamide , methocarbamol , midodrine , multivitamin with minerals, omeprazole , and triamcinolone  cream  History Review  Allergy: [name]  is allergic to zithromax [azithromycin], tramadol , and psyllium. Drug: [name]   reports no history of drug use. Alcohol :  reports no history of alcohol  use. Tobacco:  reports that she quit smoking about 5 years ago. Her smoking use included cigarettes. She started smoking about 30 years ago. She has a 6.3 pack-year smoking history. She has never used smokeless tobacco. Social: [name]   reports that she quit smoking about 5 years ago. Her smoking use included cigarettes. She started smoking about 30 years ago. She has a 6.3 pack-year smoking history. She has never used smokeless tobacco. She reports that she does not drink alcohol  and does not use drugs. Medical:  has a past medical history of Acute pansinusitis (08/02/2017), Anxiety, Arthritis, ASD (atrial septal defect), Cancer (HCC), Cataract, Chronic pain, CKD (chronic kidney disease), Dyspnea, Fatty liver (09/04/2019), GERD (gastroesophageal reflux disease), Heart murmur, History of hiatal hernia, Hyperlipidemia, Legally blind in right eye, as defined in [place] , Lumbar herniated disc, Migraines, On home oxygen  therapy (06/15/2022), OSA on CPAP (06/15/2022), PONV (postoperative nausea and vomiting), and Sciatica. Surgical: [name]   has a past surgical history that includes Breast surgery (Bilateral, 2011); Cataract extraction; Bunionectomy; Cleft palate repair; Cardiac surgery; Ablation; transthoracic echocardiogram; Cardiac catheterization; Lumbar laminectomy/decompression microdiscectomy (Left, 05/23/2016); Lumbar laminectomy/decompression microdiscectomy (Left, 05/23/2016); Shoulder injection (Left, 05/23/2016); Reduction  mammaplasty (2011); Eye surgery (Right, 2019); Tubal ligation; and Breast lumpectomy with radioactive seed and sentinel lymph node biopsy (Right, 11/23/2020). Family: family history includes Arthritis in her mother; Cancer in her father; Diabetes in her mother; High blood pressure in her mother; Hypertension in her mother.  Laboratory Chemistry Profile   Renal Lab Results  Component Value Date   BUN 22 (H) 05/26/2023   CREATININE 1.33 (H) 05/26/2023   LABCREA 123.4 10/27/2021   BCR 15 03/15/2023   GFRAA >60 02/24/2020   GFRNONAA 46 (L) 05/26/2023    Hepatic Lab Results  Component Value Date   AST 67 (H) 05/26/2023   ALT 54 (H) 05/26/2023   ALBUMIN  3.5 05/26/2023   ALKPHOS 239 (H) 05/26/2023   LIPASE 35 07/12/2021    Electrolytes Lab Results  Component Value Date   NA 139 05/26/2023   K 4.3 05/26/2023   CL 104 05/26/2023   CALCIUM  9.2 05/26/2023   MG 1.6 03/15/2023   PHOS 2.6 04/26/2022    Bone Lab Results  Component Value Date   VD25OH 30.51 06/26/2019   VD125OH2TOT 62 05/28/2021   CI6874NY7 <8 05/28/2021   [id number] 62 05/28/2021   25OHVITD1 50 11/27/2019   25OHVITD2 46 11/27/2019   25OHVITD3 4.1 11/27/2019    Inflammation (CRP: Acute Phase) (ESR: Chronic Phase) Lab Results  Component Value Date   CRP 5.6 (H) 07/28/2022   ESRSEDRATE 60 (H) 03/23/2023   LATICACIDVEN 1.5 07/23/2022         Note: Above Lab results reviewed.  Recent Imaging Review  CT CHEST ABDOMEN PELVIS WO CONTRAST CLINICAL DATA:  Metastatic breast cancer  EXAM: CT CHEST, ABDOMEN AND PELVIS WITHOUT CONTRAST  TECHNIQUE: Multidetector CT imaging of the chest, abdomen and pelvis was performed following the standard protocol without IV contrast.  RADIATION DOSE REDUCTION: This exam was performed according to the departmental dose-optimization program which includes automated exposure control, adjustment of the mA and/or  kV according to patient size and/or use of iterative reconstruction  technique.  COMPARISON:  CT chest dated 07/26/2022.  PET-CT dated 05/13/2022.  FINDINGS: CT CHEST FINDINGS  Cardiovascular: The heart is normal in size. No pericardial effusion.  No evidence of thoracic aortic aneurysm. Atherosclerotic calcifications of the aortic arch.  Stable atrial septal occluder device.  Mediastinum/Nodes: No suspicious mediastinal lymphadenopathy.  Status post right axillary lymph node dissection.  Visualized thyroid  is unremarkable.  Lungs/Pleura: Stable 4 mm nodule in the posterior left lung apex (series 4/image 19).  Stable 7 mm irregular nodule in the right lung apex, unchanged.  Mild left lower lobe scarring/atelectasis. Prior patchy right lung opacities have resolved. No focal consolidation.  No pleural effusion or pneumothorax.  Musculoskeletal: Status post right breast lumpectomy.  Degenerative changes of the thoracic spine.  CT ABDOMEN PELVIS FINDINGS  Hepatobiliary: Unenhanced liver is unremarkable.  Layering small gallstones, without associated inflammatory changes. No intrahepatic or extrahepatic ductal dilatation.  Pancreas: Within normal limits.  Spleen: Within normal limits.  Adrenals/Urinary Tract: Adrenal glands are within normal limits.  Kidneys are within normal limits. No renal, ureteral, or bladder calculi. No hydronephrosis.  Bladder is within normal limits.  Stomach/Bowel: Stomach is within normal limits.  No evidence of bowel obstruction.  Appendix is mildly prominent but within normal limits (series 2/image 80), without evidence of appendicitis.  No colonic wall thickening or inflammatory changes.  Vascular/Lymphatic: No evidence of abdominal aortic aneurysm.  Atherosclerotic calcifications of the abdominal aorta and branch vessels.  No suspicious abdominopelvic lymphadenopathy.  Reproductive: Uterus is grossly unremarkable.  Bilateral ovaries are within normal limits.  Other: No abdominopelvic  ascites.  Musculoskeletal: Postprocedural changes in the bilateral gluteal regions.  Degenerative changes of the lumbar spine.  IMPRESSION: Status post right breast lumpectomy and right axillary lymph node dissection.  No evidence of recurrent or metastatic disease. Stable apical lung nodules, benign.  Additional ancillary findings as above.  Electronically Signed   By: Pinkie Pebbles M.D.   On: 05/28/2023 01:32  MR Lumbar Spine w/o contrast   Narrative CLINICAL DATA:  Spinal stenosis, lumbar. Low back pain radiating to bilateral sides/legs with numbness and weakness in bilateral legs for years. History of lumbar spine laminectomy in 2017.   EXAM: MRI LUMBAR SPINE WITHOUT CONTRAST   TECHNIQUE: Multiplanar, multisequence MR imaging of the lumbar spine was performed. No intravenous contrast was administered.   COMPARISON:  MR lumbar 01/13/2017; X-ray lumbar 08/26/2021.   FINDINGS: Segmentation:  5 lumbar vertebra   Alignment: 4 mm anterolisthesis L2-3 is unchanged. 2 mm anterolisthesis L3-4 unchanged. 4 mm retrolisthesis L4-5 and L5-S1 also unchanged.   Vertebrae: Negative for fracture or mass. Normal appearing bone marrow.   Conus medullaris and cauda equina: Conus extends to the L2-3 level. Conus and cauda equina appear normal.   Paraspinal and other soft tissues: Negative for paraspinous mass, adenopathy, fluid collection   Disc levels:   L1-2: Minimal disc degeneration. Moderate to advanced facet degeneration. Interval development of posterior epidural synovial cyst measuring 7 x 8 mm. This flattens the posterior thecal sac but is not causing significant spinal stenosis. Generous size spinal canal.   L2-3: Anterolisthesis. Moderate to advanced facet degeneration bilaterally. No significant stenosis   L3-4: Mild anterolisthesis. Shallow right foraminal disc protrusion unchanged from the prior study. Mild disc degeneration and moderate facet  degeneration. Negative for stenosis.   L4-5: Left laminotomy. Diffuse bulging of the disc and mild facet degeneration. Mild left subarticular stenosis, unchanged. Spinal canal adequate  in size   L5-S1: Left laminotomy. Retrolisthesis with disc degeneration and spurring. Bilateral facet degeneration. Mild subarticular stenosis bilaterally. No interval change.   IMPRESSION: Postop laminectomy left L4-5 and L5-S1. No recurrent disc protrusion. Subarticular stenosis left L4-5 unchanged. Subarticular stenosis bilaterally L5-S1 unchanged   Multilevel facet degeneration. Interval development of 7 x 8 mm synovial cyst at L1-2 projecting into the posterior epidural space in the midline. This is not causing significant spinal stenosis.     Electronically Signed By: Carlin Gaskins M.D. On: 09/29/2021 10:49   MR Lumbar Spine W Wo Contrast   Narrative CLINICAL DATA:  Intermittent back pain for 5 years, bilateral lower extremity burning and numbness. Follow-up radiculopathy.   EXAM: MRI LUMBAR SPINE WITHOUT AND WITH CONTRAST   TECHNIQUE: Multiplanar and multiecho pulse sequences of the lumbar spine were obtained without and with intravenous contrast.   CONTRAST:  17mL MULTIHANCE  GADOBENATE DIMEGLUMINE  529 MG/ML IV SOLN   COMPARISON:  MRI of the lumbar spine March 06, 2016 and lumbar spine radiographs August 25, 2016   FINDINGS: SEGMENTATION: For the purposes of this report, the last well-formed intervertebral disc will be reported as L5-S1.   ALIGNMENT: Straightened lumbar lordosis. Minimal grade 1 L2-3 anterolisthesis. Minimal grade 1 L4-5 retrolisthesis. Grade 1 L5-S1 retrolisthesis.   VERTEBRAE:Vertebral bodies are intact. Moderate to severe L5-S1 disc height loss, progressed from prior MRI. Mild desiccation lower lumbar discs. Mild chronic discogenic endplate changes L5-S1. Similar low T1, bright STIR signal single segment upper coccyx, without definite enhancement though,  not tailored for evaluation. No abnormal or acute lumbar spine bone marrow signal or enhancement. No abnormal disc enhancement.   CONUS MEDULLARIS: Conus medullaris terminates at L1-2 and demonstrates normal morphology and signal characteristics. Cauda equina is normal. No abnormal cord, leptomeningeal or epidural enhancement.   PARASPINAL AND SOFT TISSUES: Included prevertebral and paraspinal soft tissues are nonacute. Moderate LEFT sacral paraspinal denervation/atrophy.   DISC LEVELS:   T12-L1, L1-2: No disc bulge, canal stenosis nor neural foraminal narrowing. Mild to moderate facet arthropathy.   L2-3: Anterolisthesis. Unroofing of the disc without disc bulge. 4 mm RIGHT facet synovial cyst decreased from 9 mm, decreased fluid component. Moderate to severe RIGHT, moderate LEFT facet arthropathy and ligamentum flavum redundancy without canal stenosis or neural foraminal narrowing.   L3-4: Small broad-based RIGHT extraforaminal disc protrusion unchanged. Moderate to severe facet arthropathy without canal stenosis or neural foraminal narrowing.   L4-5: Retrolisthesis. LEFT hemilaminectomy. Homogeneous enhancing granulation tissue within the surgical bed, no fluid collection. Small LEFT subarticular disc protrusion and annular fissure persists. No canal stenosis. Mild LEFT neural foraminal narrowing.   L5-S1: Bilateral L5-S1 laminectomies. Homogeneous enhancing granulation tissue within the surgical bed, no focal fluid collection. Mild facet arthropathy. No canal stenosis. Mild LEFT neural foraminal narrowing.   IMPRESSION: 1. Status post LEFT L4-5 and bilateral L5-S1 laminectomies. 2. Grade 1 L2-3 anterolisthesis, grade 1 L4-5 retrolisthesis, grade 1 L5-S1 retrolisthesis. 3. No canal stenosis. Mild LEFT L4-5 and L5-S1 neural foraminal narrowing. 4. Similar abnormal upper coccyx bone marrow signal, nonspecific and incompletely characterized.     Electronically  Signed By: Minerva Salle M.D. On: 01/14/2017 00:23   DG Lumbar Spine 1 View   Narrative CLINICAL DATA:  Left L4-5 decompression.   EXAM: LUMBAR SPINE - 1 VIEW   COMPARISON:  05/23/2016 intraoperative lumbar spine radiographs .   FINDINGS: There is a posterior approach surgical marking device with the tip overlying the spinal canal at the level of the L5-S1  disc. Additional surgical instruments overlie the posterior lower back soft tissues at the L4-S1 levels.   IMPRESSION: Posterior approach surgical marking device position as described.     Electronically Signed By: Selinda DELENA Blue M.D. On: 05/23/2016 10:57  Note: Reviewed        Physical Exam  General appearance: Well nourished, well developed, and well hydrated. In no apparent acute distress Mental status: Alert, oriented x 3 (person, place, & time)       Respiratory: No evidence of acute respiratory distress Eyes: PERLA Vitals: BP 126/66   Pulse 85   Temp (!) 97.2 F (36.2 C)   Resp 16   Ht 5' 7 (1.702 m)   Wt 200 lb (90.7 kg)   SpO2 99%   BMI 31.32 kg/m  BMI: Estimated body mass index is 31.32 kg/m as calculated from the following:   Height as of this encounter: 5' 7 (1.702 m).   Weight as of this encounter: 200 lb (90.7 kg). Ideal: Ideal body weight: 61.6 kg (135 lb 12.9 oz) Adjusted ideal body weight: 73.2 kg (161 lb 7.7 oz)  Lumbar Spine Area Exam  Skin & Axial Inspection: Well healed scar from previous spine surgery detected Alignment: Symmetrical Functional ROM: Pain restricted ROM       Stability: No instability detected Muscle Tone/Strength: Functionally intact. No obvious neuro-muscular anomalies detected. Sensory (Neurological): facet mediated Palpation: No palpable anomalies         Mild pain with lumbar extension and facet loading.   Ambulation: Unassisted Gait: Antalgic Posture: Difficulty standing up straight, due to pain  Lower Extremity Exam      Side: Right lower extremity    Side: Left lower extremity  Stability: No instability observed           Stability: No instability observed          Skin & Extremity Inspection: Skin color, temperature, and hair growth are WNL. No peripheral edema or cyanosis. No masses, redness, swelling, asymmetry, or associated skin lesions. No contractures.   Skin & Extremity Inspection: Skin color, temperature, and hair growth are WNL. No peripheral edema or cyanosis. No masses, redness, swelling, asymmetry, or associated skin lesions. No contractures.  Functional ROM: Pain restricted ROM for hip and knee joints           Functional ROM: Pain restricted ROM for hip and knee joints          Muscle Tone/Strength: Functionally intact. No obvious neuro-muscular anomalies detected.   Muscle Tone/Strength: Functionally intact. No obvious neuro-muscular anomalies detected.  Sensory (Neurological): Unimpaired         Sensory (Neurological): Unimpaired        DTR: Patellar: deferred today Achilles: deferred today Plantar: deferred today   DTR: Patellar: deferred today Achilles: deferred today Plantar: deferred today  Palpation: No palpable anomalies   Palpation: No palpable anomalies     Assessment   Diagnosis Status  1. Chronic pain syndrome   2. Lumbar facet syndrome (Multilevel) (Bilateral) (R>L)   3. Spondylosis without myelopathy or radiculopathy, lumbosacral region   4. Grade 1 Retrolisthesis (L4-5, L5-S1)   5. Grade 1 Anterolisthesis (L2-3, L3-4)   6. Post laminectomy syndrome     Controlled Having a Flare-up Controlled     Plan of Care  Assessment and Plan    Chronic Pain Management   Belbuca  has been more effective than oxycodone  for her pain management, leading to a decision to switch back to Belbuca  300 mcg on January  15th, contingent on financial ability. She has considered future options for her low back and leg pain, including spinal cord stimulators, which have been beneficial previously. However, she prefers to  wait until her ankle heals before exploring spinal cord stimulator options further. A follow-up is scheduled in three months to reassess pain management and discuss the potential for a spinal cord stimulator.  Low Back Pain   Her low back pain, exacerbated by walking to the mailbox due to a slight incline, had previously found relief through a lumbar MB ablation until NOV. She now prefers to proceed with a bilateral ablation procedure for more comprehensive relief and has requested the procedure be scheduled for an 8 AM appointment on a Wednesday.  Left Ankle Fractures   She sustained four fractures in her left ankle from a fall and is currently managing pain with a brace, having received a cortisone shot for temporary relief. Surgery has not been recommended, and she has been advised to minimize weight-bearing activities. A follow-up with an orthopedic specialist is planned for this month or next to monitor her recovery.       Requested Prescriptions   Signed Prescriptions Disp Refills   Buprenorphine  HCl (BELBUCA ) 300 MCG FILM 60 Film 2    Sig: Place 1 Film inside cheek every 12 (twelve) hours.   Orders Placed This Encounter  Procedures   Radiofrequency,Lumbar    Standing Status:   Future    Expiration Date:   09/11/2023    Scheduling Instructions:     Side(s): Bilateral     Level(s): L3, L4, L5, Medial Branch Nerve(s)     Sedation: With Sedation     Scheduling Timeframe: As soon as pre-approved    Where will this procedure be performed?:   [facility]     Follow-up plan:   Return in about 3 months (around 09/11/2023) for MM, F2F.     Interventional management options:  Considering:   SCS trial Sprint PNS medial branch Diagnostic caudal ESI  Diagnostic left sacroiliac joint block  Diagnostic right IA hip joint injection      Recent Visits No visits were found meeting these conditions. Showing recent visits within past 90 days and meeting all other  requirements Today's Visits Date Type Provider Dept  06/13/23 Office Visit Marcelino Nurse, MD [facility]  Showing today's visits and meeting all other requirements Future Appointments No visits were found meeting these conditions. Showing future appointments within next 90 days and meeting all other requirements  I discussed the assessment and treatment plan with the patient. The patient was provided an opportunity to ask questions and all were answered. The patient agreed with the plan and demonstrated an understanding of the instructions.  Patient advised to call back or seek an in-person evaluation if the symptoms or condition worsens.  Duration of encounter: .  Total time on encounter, as per AMA guidelines included both the face-to-face and non-face-to-face time personally spent by the physician and/or other qualified health care professional(s) on the day of the encounter (includes time in activities that require the physician or other qualified health care professional and does not include time in activities normally performed by clinical staff). Physician's time may include the following activities when performed: preparing to see the patient (eg, review of tests, pre-charting review of records) obtaining and/or reviewing separately obtained history performing a medically appropriate examination and/or evaluation counseling and educating the patient/family/caregiver ordering medications, tests, or procedures referring and communicating with other health care  professionals (when not separately reported) documenting clinical information in the electronic or other health record independently interpreting results (not separately reported) and communicating results to the patient/ family/caregiver care coordination (not separately reported)  Note by: Wallie Sherry, MD Date: 06/13/2023; Time: 8:46 AM

## 2023-06-14 ENCOUNTER — Other Ambulatory Visit: Payer: Self-pay | Admitting: Hematology

## 2023-06-14 DIAGNOSIS — C50919 Malignant neoplasm of unspecified site of unspecified female breast: Secondary | ICD-10-CM

## 2023-06-19 ENCOUNTER — Telehealth: Payer: Self-pay | Admitting: Student in an Organized Health Care Education/Training Program

## 2023-06-19 ENCOUNTER — Other Ambulatory Visit: Payer: Self-pay | Admitting: *Deleted

## 2023-06-19 ENCOUNTER — Other Ambulatory Visit: Payer: Self-pay | Admitting: Family

## 2023-06-19 DIAGNOSIS — M47817 Spondylosis without myelopathy or radiculopathy, lumbosacral region: Secondary | ICD-10-CM

## 2023-06-19 DIAGNOSIS — F5101 Primary insomnia: Secondary | ICD-10-CM

## 2023-06-19 DIAGNOSIS — M47816 Spondylosis without myelopathy or radiculopathy, lumbar region: Secondary | ICD-10-CM

## 2023-06-19 DIAGNOSIS — G894 Chronic pain syndrome: Secondary | ICD-10-CM

## 2023-06-19 MED ORDER — BELBUCA 300 MCG BU FILM: 1.0000 | ORAL_FILM | Freq: Two times a day (BID) | BUCCAL | 2 refills | Status: AC

## 2023-06-19 NOTE — Telephone Encounter (Signed)
 PT called stated that she will like for her Belbuca to be send to [facility]. PT stated that it's more afford. Current pharmacy is $400. Please give patient a call. TY

## 2023-06-19 NOTE — Telephone Encounter (Signed)
Med refill request sent to [facility]

## 2023-06-19 NOTE — Telephone Encounter (Signed)
 Patient has request refill on medication Xanax. Patient medication last refilled 05/08/2023 with 30 tablets and 3 refills. Patient medication pend and sent to PCP Ngetich, Donalee Citrin, NP for approval.

## 2023-06-19 NOTE — Telephone Encounter (Signed)
 Patient aware.

## 2023-06-19 NOTE — Telephone Encounter (Signed)
 Per PDMP review medication was filled 06/08/2023

## 2023-06-20 NOTE — Telephone Encounter (Signed)
 Please refuse

## 2023-06-22 ENCOUNTER — Telehealth: Payer: Self-pay | Admitting: Pharmacy Technician

## 2023-06-22 ENCOUNTER — Telehealth: Payer: Self-pay | Admitting: Pharmacist Clinician (PhC)/ Clinical Pharmacy Specialist

## 2023-06-22 ENCOUNTER — Other Ambulatory Visit (HOSPITAL_COMMUNITY): Payer: Self-pay

## 2023-06-22 ENCOUNTER — Other Ambulatory Visit: Payer: Self-pay

## 2023-06-22 DIAGNOSIS — C50919 Malignant neoplasm of unspecified site of unspecified female breast: Secondary | ICD-10-CM

## 2023-06-22 NOTE — Telephone Encounter (Signed)
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

## 2023-06-23 ENCOUNTER — Inpatient Hospital Stay: Payer: Medicare Other | Attending: Hematology

## 2023-06-23 ENCOUNTER — Telehealth: Payer: Self-pay | Admitting: Pharmacy Technician

## 2023-06-23 ENCOUNTER — Inpatient Hospital Stay: Payer: Medicare Other

## 2023-06-23 ENCOUNTER — Other Ambulatory Visit (HOSPITAL_COMMUNITY): Payer: Self-pay

## 2023-06-23 VITALS — BP 136/66 | HR 105 | Temp 97.8°F | Resp 16

## 2023-06-23 DIAGNOSIS — C7800 Secondary malignant neoplasm of unspecified lung: Secondary | ICD-10-CM | POA: Diagnosis not present

## 2023-06-23 DIAGNOSIS — E876 Hypokalemia: Secondary | ICD-10-CM | POA: Insufficient documentation

## 2023-06-23 DIAGNOSIS — Z87891 Personal history of nicotine dependence: Secondary | ICD-10-CM | POA: Insufficient documentation

## 2023-06-23 DIAGNOSIS — Z79818 Long term (current) use of other agents affecting estrogen receptors and estrogen levels: Secondary | ICD-10-CM | POA: Insufficient documentation

## 2023-06-23 DIAGNOSIS — C50511 Malignant neoplasm of lower-outer quadrant of right female breast: Secondary | ICD-10-CM | POA: Insufficient documentation

## 2023-06-23 DIAGNOSIS — Z79899 Other long term (current) drug therapy: Secondary | ICD-10-CM | POA: Insufficient documentation

## 2023-06-23 DIAGNOSIS — Z170 Estrogen receptor positive status [ER+]: Secondary | ICD-10-CM | POA: Insufficient documentation

## 2023-06-23 DIAGNOSIS — Z5111 Encounter for antineoplastic chemotherapy: Secondary | ICD-10-CM | POA: Insufficient documentation

## 2023-06-23 DIAGNOSIS — C50919 Malignant neoplasm of unspecified site of unspecified female breast: Secondary | ICD-10-CM

## 2023-06-23 LAB — CMP (CANCER CENTER ONLY)
ALT: 72 U/L — ABNORMAL HIGH (ref 0–44)
AST: 73 U/L — ABNORMAL HIGH (ref 15–41)
Albumin: 3.8 g/dL (ref 3.5–5.0)
Alkaline Phosphatase: 236 U/L — ABNORMAL HIGH (ref 38–126)
Anion gap: 9 (ref 5–15)
BUN: 25 mg/dL — ABNORMAL HIGH (ref 6–20)
CO2: 28 mmol/L (ref 22–32)
Calcium: 9.4 mg/dL (ref 8.9–10.3)
Chloride: 102 mmol/L (ref 98–111)
Creatinine: 1.29 mg/dL — ABNORMAL HIGH (ref 0.44–1.00)
GFR, Estimated: 48 mL/min — ABNORMAL LOW (ref >60)
Glucose, Bld: 114 mg/dL — ABNORMAL HIGH (ref 70–99)
Potassium: 4 mmol/L (ref 3.5–5.1)
Sodium: 139 mmol/L (ref 135–145)
Total Bilirubin: 1 mg/dL (ref 0.0–1.2)
Total Protein: 7.4 g/dL (ref 6.5–8.1)

## 2023-06-23 LAB — CBC WITH DIFFERENTIAL (CANCER CENTER ONLY)
Abs Immature Granulocytes: 0.01 10*3/uL (ref 0.00–0.07)
Basophils Absolute: 0 10*3/uL (ref 0.0–0.1)
Basophils Relative: 1 %
Eosinophils Absolute: 0 10*3/uL (ref 0.0–0.5)
Eosinophils Relative: 1 %
HCT: 34.9 % — ABNORMAL LOW (ref 36.0–46.0)
Hemoglobin: 11.8 g/dL — ABNORMAL LOW (ref 12.0–15.0)
Immature Granulocytes: 0 %
Lymphocytes Relative: 48 %
Lymphs Abs: 2.2 10*3/uL (ref 0.7–4.0)
MCH: 35.8 pg — ABNORMAL HIGH (ref 26.0–34.0)
MCHC: 33.8 g/dL (ref 30.0–36.0)
MCV: 105.8 fL — ABNORMAL HIGH (ref 80.0–100.0)
Monocytes Absolute: 0.3 10*3/uL (ref 0.1–1.0)
Monocytes Relative: 7 %
Neutro Abs: 2 10*3/uL (ref 1.7–7.7)
Neutrophils Relative %: 43 %
Platelet Count: 215 10*3/uL (ref 150–400)
RBC: 3.3 MIL/uL — ABNORMAL LOW (ref 3.87–5.11)
RDW: 14.5 % (ref 11.5–15.5)
WBC Count: 4.6 10*3/uL (ref 4.0–10.5)
nRBC: 0 % (ref 0.0–0.2)

## 2023-06-23 MED ORDER — FULVESTRANT 250 MG/5ML IM SOSY
500.0000 mg | PREFILLED_SYRINGE | Freq: Once | INTRAMUSCULAR | Status: AC
  Administered 2023-06-23: 500 mg via INTRAMUSCULAR
  Filled 2023-06-23: qty 10

## 2023-06-23 NOTE — Telephone Encounter (Signed)
PA request has been Submitted. New Encounter created for follow up. For additional info see Pharmacy Prior Auth telephone encounter from 06/23/23.

## 2023-06-23 NOTE — Telephone Encounter (Signed)
Closed- Judeth Cornfield was able to finish- had trouble with name in system

## 2023-06-23 NOTE — Telephone Encounter (Signed)
Pharmacy Patient Advocate Encounter   Received notification from Pt Calls Messages that prior authorization for Repatha SureClick 140MG /ML auto-injectors is required/requested.   Insurance verification completed.   The patient is insured through [facility] .   Per test claim: PA required; PA submitted to above mentioned insurance via CoverMyMeds Key/confirmation #/EOC [id number] Status is pending    Rosalee Kaufman, RPH-CPP    06/22/23  8:17 AM Note Please do PA for Repatha - familial hyperlipidemia.

## 2023-06-26 ENCOUNTER — Other Ambulatory Visit (HOSPITAL_COMMUNITY): Payer: Self-pay

## 2023-06-26 ENCOUNTER — Telehealth: Payer: Self-pay | Admitting: Pharmacy Technician

## 2023-06-26 MED ORDER — REPATHA SURECLICK 140 MG/ML ~~LOC~~ SOAJ: 140.0000 mg | SUBCUTANEOUS | 3 refills | Status: DC

## 2023-06-26 NOTE — Telephone Encounter (Signed)
Pharmacy Patient Advocate Encounter  Received notification from [facility] that Prior Authorization for Repatha SureClick 140MG /ML auto-injectors has been APPROVED from 06/23/23 to 12/21/23. Ran test claim, Copay is $47.00. This test claim was processed through [facility]- copay amounts may vary at other pharmacies due to pharmacy/plan contracts, or as the patient moves through the different stages of their insurance plan.  A prescription will need to be sent in to pharmacy   PA #/Case ID/Reference #: [id number]

## 2023-06-26 NOTE — Telephone Encounter (Signed)
PA request has been Approved. New Encounter created for follow up. For additional info see Pharmacy Prior Auth telephone encounter from 06/23/23.

## 2023-06-26 NOTE — Addendum Note (Signed)
Addended by: Rosalee Kaufman on: 06/26/2023 12:20 PM   Modules accepted: Orders

## 2023-07-02 ENCOUNTER — Other Ambulatory Visit: Payer: Self-pay | Admitting: Family

## 2023-07-03 ENCOUNTER — Encounter (HOSPITAL_BASED_OUTPATIENT_CLINIC_OR_DEPARTMENT_OTHER): Payer: Self-pay | Admitting: Pulmonary Disease

## 2023-07-03 ENCOUNTER — Ambulatory Visit (HOSPITAL_BASED_OUTPATIENT_CLINIC_OR_DEPARTMENT_OTHER): Payer: Medicare Other | Admitting: Pulmonary Disease

## 2023-07-03 VITALS — BP 108/68 | HR 104 | Resp 16 | Ht 67.0 in | Wt 206.3 lb

## 2023-07-03 DIAGNOSIS — R0602 Shortness of breath: Secondary | ICD-10-CM

## 2023-07-03 DIAGNOSIS — G4733 Obstructive sleep apnea (adult) (pediatric): Secondary | ICD-10-CM

## 2023-07-03 MED ORDER — FLUTICASONE-SALMETEROL 250-50 MCG/ACT IN AEPB: 1.0000 | INHALATION_SPRAY | Freq: Two times a day (BID) | RESPIRATORY_TRACT | 3 refills | Status: DC

## 2023-07-03 NOTE — Progress Notes (Addendum)
Subjective:   PATIENT ID: Brandy Milian GENDER: female DOB: 18-Mar-1966, MRN: [id number]   HPI  Chief Complaint  Patient presents with   Follow-up    Breathing has been fine. Still using CPAP-Adapt   Reason for Visit: Follow-up  Brandy [name] is a 58 year old female former smoker with metastatic breast cancer, obesity, chronic diastolic heart failure, chronic pain, anxiety, ASD s/p post-repair in 2006 and insomnia who presents for follow-up  Synopsis: Initially referred for evaluation of shortness of breath. PFTs with mild restrictive defect and reduced DLCO. On verzenio and faslodex for metastatic breast cancer 2021 - Methacholine challenge negative. Prior pulmonary nodules decreased in size since 02/2019, likely representing mets post-treatment. CT with no ILD. 2022 - S/p right lumpectomy in June with no breast cancer progression. Worsening dyspnea with unclear cause.Some coughing, nonproductive. Tried Breo which was ineffective. 2023 - Dx with OSA and on CPAP. Hospitalized in Nov for electrolyte issues and discharged on home O2. Ambulatory O2 in Dec with no desaturations.  05/18/22 Since our last visit she was started on CPAP and followed by Cardiology. She reports she is compliant with therapy. Last month she was hospitalized for electrolyte issues and discharged on home O2. Her last potassium was normal which Oncology monitors monthly. Reports worsening fatigue. Has productive cough. She is compliant with Advair but does not feels this help. She reports that walking short distances will cause her be winded. No wheezing. Has increased her O2 but unknown what her saturations are.  08/15/22 Since our last visit she was hospitalized from 07/22/22-07/29/22 for covid 19 pneumonia and treated with co-comitant bacterial pneumonia. Discharged on 2L O2. Will wear at home when she feels short of breath. She did not need O2 prior to this hospitalization and did not need any  during pulmonary rehab. She is planning to return to pulmonary rehab. She is stressed and planning to leave her husband and moving out soon.  12/14/22 Overall respiratory smptoms is good with current inhalers. Completed pulm rehab in March. Walking her dog daily and exercising twice a day with walks. The humidity will affect her breathing but no problems reported. She is compliant with her CPAP nightly and reports >4 hours. Does have some leakage because her straps are loose. Has not received her CPAP supplies due to wrong DME reported.  07/03/23 Since our last visit she is doing well on Advair Diskus. Does not use albuterol often. She has not been active lately but planning for re-enroll into [employer]. Has had someone else walking her dog. Wearing CPAP nightly for >8 hours with improved qualify of sleep.      No data to display         Social History: Quit smoking in 2020. 1/4 ppd x 25 years.  Stressed about her husband, poor housing situation  Past Medical History:  Diagnosis Date   Acute pansinusitis 08/02/2017   Anxiety    Arthritis    ASD (atrial septal defect)    s/p closure with Amplatzer device 10/05/04 (Dr. Celso Amy, [facility]) 10/05/04   Cancer [facility])    Cataract    Chronic pain    CKD (chronic kidney disease)    Dyspnea    Fatty liver 09/04/2019   GERD (gastroesophageal reflux disease)    Heart murmur    no longer heard   History of hiatal hernia    Hyperlipidemia    Legally blind in right eye, as defined in [place]  Lumbar herniated disc    Migraines    On home oxygen therapy 06/15/2022   Pt was given O2 on D/C from [facility] 04/2022   OSA on CPAP 06/15/2022   PONV (postoperative nausea and vomiting)    Sciatica      Allergies  Allergen Reactions   Zithromax [Azithromycin] Shortness Of Breath, Itching and Other (See Comments)    TOTAL BODY ITCHING [EVEN SOLES OF FEET] WHEEZING    Tramadol Itching and Other (See Comments)    Has taken recently without any  side effects.   Psyllium Nausea And Vomiting and Other (See Comments)    Metamucil. Sneezing       Outpatient Medications Prior to Visit  Medication Sig Dispense Refill   acetaminophen (TYLENOL) 500 MG tablet Take 500 mg by mouth daily as needed for moderate pain.     albuterol (VENTOLIN HFA) 108 (90 Base) MCG/ACT inhaler Inhale 2 puffs into the lungs every 6 (six) hours as needed for wheezing or shortness of breath. 8 [name] 6   ALPRAZolam (XANAX) 1 MG tablet TAKE 1 TABLET BY MOUTH AT BEDTIME AS NEEDED FOR ANXIETY 30 tablet 3   amitriptyline (ELAVIL) 75 MG tablet Take 1 tablet (75 mg total) by mouth at bedtime. 90 tablet 3   Buprenorphine HCl (BELBUCA) 300 MCG FILM Place 1 Film inside cheek every 12 (twelve) hours. 60 Film 2   Carboxymethylcellul-Glycerin (LUBRICATING EYE DROPS OP) Place 1 drop into the right eye in the morning, at noon, and at bedtime.     cyanocobalamin (VITAMIN B12) 500 MCG tablet Take 1 tablet (500 mcg total) by mouth daily. 30 tablet 0   diphenoxylate-atropine (LOMOTIL) 2.5-0.025 MG tablet Take 1 tablet by mouth 3 (three) times daily as needed for diarrhea or loose stools. 30 tablet 0   Evolocumab (REPATHA SURECLICK) 140 MG/ML SOAJ Inject 140 mg into the skin every 14 (fourteen) days. 6 mL 3   ketoconazole (NIZORAL) 2 % cream Apply 1 Application topically 2 (two) times daily.     loperamide (IMODIUM) 2 MG capsule Take 1-2 capsules (2-4 mg total) by mouth 4 (four) times daily as needed for diarrhea or loose stools. 30 capsule 1   methocarbamol (ROBAXIN) 500 MG tablet Take 1 tablet (500 mg total) by mouth every 6 (six) hours as needed for muscle spasms. 40 tablet 2   midodrine (PROAMATINE) 10 MG tablet Take 1 tablet (10 mg total) by mouth in the morning and at bedtime. 180 tablet 1   Multiple Vitamin (MULTIVITAMIN WITH MINERALS) TABS tablet Take 1 tablet by mouth daily.     omeprazole (PRILOSEC OTC) 20 MG tablet Take 20 mg by mouth daily.     PRESCRIPTION MEDICATION CPAP- At  bedtime     triamcinolone cream (KENALOG) 0.1 % Apply 1 Application topically 2 (two) times daily. 30 [name] 0   VERZENIO 100 MG tablet TAKE 1 TABLET BY MOUTH TWICE DAILY 56 tablet 0   Vitamin D, Ergocalciferol, (DRISDOL) 1.25 MG (50000 UNIT) CAPS capsule TAKE 1 CAPSULE BY MOUTH EVERY 7  DAYS ( MONDAY ) 5 capsule 9   XIIDRA 5 % SOLN Place 1 drop into the left eye 2 (two) times daily. (Patient not taking: Reported on 06/13/2023)     ezetimibe (ZETIA) 10 MG tablet Take 1 tablet (10 mg total) by mouth daily. 100 tablet 2   fluticasone-salmeterol (ADVAIR) 250-50 MCG/ACT AEPB Inhale 1 puff into the lungs in the morning and at bedtime. 180 each 3   No facility-administered  medications prior to visit.    Review of Systems  Constitutional:  Negative for chills, diaphoresis, fever, malaise/fatigue and weight loss.  HENT:  Negative for congestion.   Respiratory:  Positive for shortness of breath. Negative for cough, hemoptysis, sputum production and wheezing.   Cardiovascular:  Negative for chest pain, palpitations and leg swelling.    Objective:   Vitals:   07/03/23 1039  BP: 108/68  Pulse: (!) 104  Resp: 16  SpO2: 94%  Weight: 206 lb 4.8 oz (93.6 kg)  Height: 5\' 7"  (1.702 m)  SpO2: 94 %  Physical Exam: General: Well-appearing, no acute distress HENT: Brandy [name], AT, s/p clef palate repair Eyes: EOMI, no scleral icterus Respiratory: Clear to auscultation bilaterally.  No crackles, wheezing or rales Cardiovascular: RRR, -M/R/[name], no JVD Extremities:-Edema,-tenderness Neuro: AAO x4, CNII-XII grossly intact Psych: Normal mood, normal affect   Data Reviewed:  Imaging: CTA 10/30/19 - No pulmonary embolism. RUL 10x58mm, 5mm, unchanged. RLL nodule 4mm new CT Chest 05/21/20 - No evidence of ILD. 1.4 x 1.0 x 0.8 cm aggressive appearing nodule near the apex of the right upper lobe PET 08/07/20 - 8 mm irregular nodule in right apex improved compared to CT 2020 PET 05/13/22 - 05/13/22 - Stable 8 mm nodule in  the RUL, stable LUL 3mm. CT Chest 07/26/22 - Bilateral pulmonary infiltates R>L CT CAP 05/19/23 - Stable 4 mm LUL nodule and 7 mm RUL nodule, mild LLL atelectasis  PFT: 12/18/19 FVC 3.13 (83%) FEV1 2.49 (92%) Ratio 88  TLC 78% DLCO 58% Interpretation: Mild restrictive defect with moderate reduction in gas exchange  02/17/20 Negative methacholine challenge  04/30/21 FVC 2.64 (85%) FEV1 2.32 (94%) Ratio 89  TLC 80% DLCO 59% Interpretation: Isolated moderate reduction in gas exchange  Sleep: Sleep 07/14/21 - Mild OSA AHI 6.2. Nadir SpO2 81%  Echocardiogram: 09/2019 Normal EF, grade I DD, mild MR.  06/03/21 EF 60-65%. Normal diastolics. No valvular or WMA  Coronary CT 06/17/21 - Calcium score of 0  CBC    Component Value Date/Time   WBC 4.6 06/23/2023 0811   WBC 3.9 03/15/2023 0927   RBC 3.30 (L) 06/23/2023 0811   HGB 11.8 (L) 06/23/2023 0811   HGB 12.5 05/06/2022 0958   HCT 34.9 (L) 06/23/2023 0811   HCT 37.2 05/06/2022 0958   PLT 215 06/23/2023 0811   PLT 288 05/06/2022 0958   MCV 105.8 (H) 06/23/2023 0811   MCV 105 (H) 05/06/2022 0958   MCH 35.8 (H) 06/23/2023 0811   MCHC 33.8 06/23/2023 0811   RDW 14.5 06/23/2023 0811   RDW 13.8 05/06/2022 0958   LYMPHSABS 2.2 06/23/2023 0811   LYMPHSABS 3.2 (H) 07/28/2021 0916   MONOABS 0.3 06/23/2023 0811   EOSABS 0.0 06/23/2023 0811   EOSABS 0.1 07/28/2021 0916   BASOSABS 0.0 06/23/2023 0811   BASOSABS 0.0 07/28/2021 0916   Absolute eos  01/22/21 - 300 05/03/21 - 0 06/01/21 - 0 07/28/21 -100 04/22/22 -0  CPAP compliance per phone app 06/03/23-07/02/23 >4 hours 25/30 days (83.3%) AHI 0.067    Assessment & Plan:   Discussion: 58 year old female former smoker (6 pack-years) with metastatic breast cancer s/p right lumpectomy with pulmonary mets on Verzenio and Faslodex, HTN, chronic respiratory failure, CKD III whop resents for follow-up. Discussed clinical course and management of COPD/asthma including bronchodilator  regimen, preventive care and action plan for exacerbation. Currently well controlled. No exacerbations. Compliant with CPAP. Ambulatory O2 in clinic with no desaturations  Breo - d/c'd due  to ineffectiveness  Shortness of breath - prior covid infection, improved but not active Deconditioning --Prior methacholine challenge which was negative for asthma --CONTINUE Advair Diskus 250-50 mcg ONE puff TWICE a day. REFILL to [facility] --CONTINUE Albuterol AS NEEDED for shortness of breath --Use nebulizers AS NEEDED for exacerbations --Graduated Pulmonary Rehab in 06/2022-08/2022 --Encourage regular regular aerobic exercise daily up to 20-30 minutes  Nocturnal hypoxemia 2/2 OSA Isolated moderate DLCO --Echocardiogram neg --Low suspicion for PH. If above treatment neg, could consider RHC but the diagnosis is less likely in absence of hypoxemia  OSA Patient uses NIV for more than four hours nightly for at least 70% of nights during the last three months of usage. The patient has been using and benefiting from PAP use and will continue to benefit from therapy.  --Reviewed CPAP compliance report --Counseled on sleep hygiene --Counseled on weight loss/maintenance of healthy weight --Counseled NOT to drive if/when sleepy --Advised patient to wear CPAP for at least 4 hours each night for greater than 70% of the time to avoid the machine being repossessed by insurance.    Health Maintenance Immunization History  Administered Date(s) Administered   Hepatitis A 03/02/2023   Hepatitis B 03/02/2023   Influenza Inj Mdck Quad Pf 04/25/2019   Influenza,inj,Quad PF,6+ Mos 02/24/2020, 04/15/2022, 02/21/2023   Influenza,inj,quad, With Preservative 04/25/2019   Influenza-Unspecified 05/26/2021   Moderna Sars-Covid-2 Vaccination 06/29/2019, 08/03/2019, 06/24/2020   PFIZER(Purple Top)SARS-COV-2 Vaccination 05/26/2021   Pneumococcal Conjugate-13 05/29/2019   Pneumococcal Polysaccharide-23 07/29/2019   Tdap  02/21/2023   Unspecified SARS-COV-2 Vaccination 02/21/2023   Zoster Recombinant(Shingrix) 02/17/2020, 04/20/2020   CT Lung Screen - not qualified  No orders of the defined types were placed in this encounter.  Meds ordered this encounter  Medications   fluticasone-salmeterol (ADVAIR) 250-50 MCG/ACT AEPB    Sig: Inhale 1 puff into the lungs in the morning and at bedtime.    Dispense:  180 each    Refill:  3    Requesting 1 year supply   Return in about 8 months (around 03/02/2024).   I have spent a total time of 35-minutes on the day of the appointment including chart review, data review, collecting history, coordinating care and discussing medical diagnosis and plan with the patient/family. Past medical history, allergies, medications were reviewed. Pertinent imaging, labs and tests included in this note have been reviewed and interpreted independently by me.  Nikki Glanzer Mechele Collin, MD [employer] [facility] 07/03/2023 11:01 AM  Office Number [phone]

## 2023-07-03 NOTE — Patient Instructions (Signed)
Shortness of breath - prior covid infection, improved but not active Deconditioning --Prior methacholine challenge which was negative for asthma --CONTINUE Advair Diskus 250-50 mcg ONE puff TWICE a day. REFILL to [facility] --CONTINUE Albuterol AS NEEDED for shortness of breath --Use nebulizers AS NEEDED for exacerbations --Graduated Pulmonary Rehab in 06/2022-08/2022 --Encourage regular regular aerobic exercise daily up to 20-30 minutes  Nocturnal hypoxemia 2/2 OSA Isolated moderate DLCO --Ambulatory O2 in clinic with no desaturations  OSA Patient uses NIV for more than four hours nightly for at least 70% of nights during the last three months of usage. The patient has been using and benefiting from PAP use and will continue to benefit from therapy.  --Reviewed CPAP compliance report --Counseled on sleep hygiene --Counseled on weight loss/maintenance of healthy weight --Counseled NOT to drive if/when sleepy --Advised patient to wear CPAP for at least 4 hours each night for greater than 70% of the time to avoid the machine being repossessed by insurance.

## 2023-07-05 ENCOUNTER — Ambulatory Visit
Payer: Medicare Other | Attending: Student in an Organized Health Care Education/Training Program | Admitting: Student in an Organized Health Care Education/Training Program

## 2023-07-05 ENCOUNTER — Ambulatory Visit
Admission: RE | Admit: 2023-07-05 | Discharge: 2023-07-05 | Disposition: A | Discharge status: Home / Self Care | Payer: Medicare Other | Source: Ambulatory Visit | Attending: Student in an Organized Health Care Education/Training Program | Admitting: Student in an Organized Health Care Education/Training Program

## 2023-07-05 VITALS — BP 110/53 | HR 84 | Temp 97.3°F | Resp 20 | Ht 68.0 in | Wt 200.0 lb

## 2023-07-05 DIAGNOSIS — M47816 Spondylosis without myelopathy or radiculopathy, lumbar region: Secondary | ICD-10-CM | POA: Insufficient documentation

## 2023-07-05 DIAGNOSIS — M47817 Spondylosis without myelopathy or radiculopathy, lumbosacral region: Secondary | ICD-10-CM | POA: Insufficient documentation

## 2023-07-05 DIAGNOSIS — G894 Chronic pain syndrome: Secondary | ICD-10-CM | POA: Insufficient documentation

## 2023-07-05 MED ORDER — MIDAZOLAM HCL 2 MG/2ML IJ SOLN
0.5000 mg | Freq: Once | INTRAMUSCULAR | Status: AC
  Administered 2023-07-05: 2 mg via INTRAVENOUS

## 2023-07-05 MED ORDER — ROPIVACAINE HCL 2 MG/ML IJ SOLN
INTRAMUSCULAR | Status: AC
  Filled 2023-07-05: qty 20

## 2023-07-05 MED ORDER — DEXAMETHASONE SODIUM PHOSPHATE 10 MG/ML IJ SOLN
INTRAMUSCULAR | Status: AC
  Filled 2023-07-05: qty 1

## 2023-07-05 MED ORDER — DEXAMETHASONE SODIUM PHOSPHATE 10 MG/ML IJ SOLN
20.0000 mg | Freq: Once | INTRAMUSCULAR | Status: AC
Start: 2023-07-05 — End: 2023-07-05
  Administered 2023-07-05: 20 mg

## 2023-07-05 MED ORDER — LACTATED RINGERS IV SOLN
Freq: Once | INTRAVENOUS | Status: AC
Start: 2023-07-05 — End: 2023-07-05

## 2023-07-05 MED ORDER — LIDOCAINE HCL 2 % IJ SOLN
20.0000 mL | Freq: Once | INTRAMUSCULAR | Status: AC
Start: 2023-07-05 — End: 2023-07-05
  Administered 2023-07-05: 400 mg

## 2023-07-05 MED ORDER — LIDOCAINE HCL 2 % IJ SOLN
INTRAMUSCULAR | Status: AC
  Filled 2023-07-05: qty 20

## 2023-07-05 MED ORDER — ROPIVACAINE HCL 2 MG/ML IJ SOLN
18.0000 mL | Freq: Once | INTRAMUSCULAR | Status: AC
  Administered 2023-07-05: 18 mL via PERINEURAL

## 2023-07-05 MED ORDER — MIDAZOLAM HCL 2 MG/2ML IJ SOLN
INTRAMUSCULAR | Status: AC
  Filled 2023-07-05: qty 2

## 2023-07-05 NOTE — Patient Instructions (Signed)
___________________________________________________________________________________________  Post-Radiofrequency (RF) Discharge Instructions  You have just completed a Radiofrequency Neurotomy.  The following instructions will provide you with information and guidelines for self-care upon discharge.  If at any time you have questions or concerns please call your physician. DO NOT DRIVE YOURSELF!!  Instructions:  Apply ice: Fill a plastic sandwich bag with crushed ice. Cover it with a small towel and apply to injection site. Apply for 15 minutes then remove x 15 minutes. Repeat sequence on day of procedure, until you go to bed. The purpose is to minimize swelling and discomfort after procedure.  Apply heat: Apply heat to procedure site starting the day following the procedure. The purpose is to treat any soreness and discomfort from the procedure.  Food intake: No eating limitations, unless stipulated above.  Nevertheless, if you have had sedation, you may experience some nausea.  In this case, it may be wise to wait at least two hours prior to resuming regular diet.  Physical activities: Keep activities to a minimum for the first 8 hours after the procedure. For the first 24 hours after the procedure, do not drive a motor vehicle,  Operate heavy machinery, power tools, or handle any weapons.  Consider walking with the use of an assistive device or accompanied by an adult for the first 24 hours.  Do not drink alcoholic beverages including beer.  Do not make any important decisions or sign any legal documents. Go home and rest today.  Resume activities tomorrow, as tolerated.  Use caution in moving about as you may experience mild leg weakness.  Use caution in cooking, use of household electrical appliances and climbing steps.  Driving: If you have received any sedation, you are not allowed to drive for 24 hours after your procedure.  Blood thinner: Restart your blood thinner 6 hours after your  procedure. (Only for those taking blood thinners)  Insulin: As soon as you can eat, you may resume your normal dosing schedule. (Only for those taking insulin)  Medications: May resume pre-procedure medications.  Do not take any drugs, other than what has been prescribed to you.  Infection prevention: Keep procedure site clean and dry.  Post-procedure Pain Diary: Extremely important that this be done correctly and accurately. Recorded information will be used to determine the next step in treatment.  Pain evaluated is that of treated area only. Do not include pain from an untreated area.  Complete every hour, on the hour, for the initial 8 hours. Set an alarm to help you do this part accurately.  Do not go to sleep and have it completed later. It will not be accurate.  Follow-up appointment: Keep your follow-up appointment after the procedure. Usually 2-6 weeks after radiofrequency. Bring you pain diary. The information collected will be essential for your long-term care.   Expect:  From numbing medicine (AKA: Local Anesthetics): Numbness or decrease in pain.  Onset: Full effect within 15 minutes of injected.  Duration: It will depend on the type of local anesthetic used. On the average, 1 to 8 hours.   From steroids (when added): Decrease in swelling or inflammation. Once inflammation is improved, relief of the pain will follow.  Onset of benefits: Depends on the amount of swelling present. The more swelling, the longer it will take for the benefits to be seen. In some cases, up to 10 days.  Duration: Steroids will stay in the system x 2 weeks. Duration of benefits will depend on multiple posibilities including persistent irritating factors.  From procedure: Some discomfort is to be expected once the numbing medicine wears off. In the case of radiofrequency procedures, this may last as long as 6 weeks. Additional post-procedure pain medication is provided for this. Discomfort is  minimized if ice and heat are applied as instructed.  Call if:  You experience numbness and weakness that gets worse with time, as opposed to wearing off.  He experience any unusual bleeding, difficulty breathing, or loss of the ability to control your bowel and bladder. (This applies to Spinal procedures only)  You experience any redness, swelling, heat, red streaks, elevated temperature, fever, or any other signs of a possible infection.  Emergency Numbers:  Durning business hours (Monday - Thursday, 8:00 AM - 4:00 PM) (Friday, 9:00 AM - 12:00 Noon): [phone]  After hours: [phone] ____________________________________________________________________________________________

## 2023-07-05 NOTE — Progress Notes (Signed)
PROVIDER NOTE: Interpretation of information contained herein should be left to medically-trained personnel. Specific patient instructions are provided elsewhere under "Patient Instructions" section of medical record. This document was created in part using STT-dictation technology, any transcriptional errors that may result from this process are unintentional.  Patient: Brandy [name] [name] Type: Established DOB: 1965/08/20 MRN: [id number] PCP: Caesar Bookman, NP  Service: Procedure DOS: 07/05/2023 Setting: Ambulatory Location: Ambulatory outpatient facility Delivery: Face-to-face Provider: Edward Jolly, MD Specialty: Interventional Pain Management Specialty designation: 09 Location: Outpatient facility Ref. Prov.: Ngetich, Dinah C, NP    Procedure:           Type: Lumbar Facet, Medial Branch Radiofrequency Ablation (RFA) #2  Laterality:  Bilateral   Level: L3, L4, and L5 Medial Branch Level(s).  Imaging: Fluoroscopy-guided         Anesthesia: Local anesthesia (1-2% Lidocaine) Anxiolysis: IV Versed         DOS: 07/05/2023  Performed by: Edward Jolly, MD  Purpose: Therapeutic/Palliative Indications: Low back pain severe enough to impact quality of life or function. Indications: 1. Lumbar facet syndrome (Multilevel) (Bilateral) (R>L)   2. Spondylosis without myelopathy or radiculopathy, lumbosacral region   3. Chronic pain syndrome    Brandy [name] has been dealing with the above chronic pain for longer than three months and has either failed to respond, was unable to tolerate, or simply did not get enough benefit from other more conservative therapies including, but not limited to: 1. Over-the-counter medications 2. Anti-inflammatory medications 3. Muscle relaxants 4. Membrane stabilizers 5. Opioids 6. Physical therapy and/or chiropractic manipulation 7. Modalities (Heat, ice, etc.) 8. Invasive techniques such as nerve blocks. Brandy [name] has  attained more than 50% relief of the pain from a series of diagnostic injections conducted in separate occasions.  Pain Score: Pre-procedure: 5 /10 Post-procedure: 0-No pain/10     Position / Prep / Materials:  Position: Prone  Prep solution: DuraPrep (Iodine Povacrylex [0.7% available iodine] and Isopropyl Alcohol, 74% w/w) Prep Area: Entire Lumbosacral Region (Lower back from mid-thoracic region to end of tailbone and from flank to flank.) Materials:  Tray: RFA (Radiofrequency) tray Needle(s):  Type: RFA (Teflon-coated radiofrequency ablation needles)  Pre-op H&P Assessment:  Brandy [name] is a 58 y.o. (year old), female patient, seen today for interventional treatment. She  has a past surgical history that includes Breast surgery (Bilateral, 2011); Cataract extraction; Bunionectomy; Cleft palate repair; Cardiac surgery; Ablation; transthoracic echocardiogram; Cardiac catheterization; Lumbar laminectomy/decompression microdiscectomy (Left, 05/23/2016); Lumbar laminectomy/decompression microdiscectomy (Left, 05/23/2016); Shoulder injection (Left, 05/23/2016); Reduction mammaplasty (2011); Eye surgery (Right, 2019); Tubal ligation; and Breast lumpectomy with radioactive seed and sentinel lymph node biopsy (Right, 11/23/2020). Brandy [name] has a current medication list which includes the following prescription(s): acetaminophen, albuterol, alprazolam, amitriptyline, belbuca, polyethyl glycol-propyl glycol, cyanocobalamin, diphenoxylate-atropine, repatha sureclick, fluticasone-salmeterol, ketoconazole, loperamide, methocarbamol, midodrine, multivitamin with minerals, omeprazole, PRESCRIPTION MEDICATION, triamcinolone cream, verzenio, vitamin d (ergocalciferol), and xiidra, and the following Facility-Administered Medications: lactated ringers. Her primarily concern today is the Back Pain (Bilateral, lower)  Initial Vital Signs:  Pulse/HCG Rate: 80  Temp: (!) 97.3 F (36.3  C) Resp: 18 BP: 130/69 SpO2: 100 %  BMI: Estimated body mass index is 30.41 kg/m as calculated from the following:   Height as of this encounter: 5\' 8"  (1.727 m).   Weight as of this encounter: 200 lb (90.7 kg).  Risk Assessment: Allergies: Reviewed. She is allergic to zithromax [azithromycin], tramadol, and psyllium.  Allergy Precautions: None required Coagulopathies:  Reviewed. None identified.  Blood-thinner therapy: None at this time Active Infection(s): Reviewed. None identified. Brandy [name] is afebrile  Site Confirmation: Brandy [name] was asked to confirm the procedure and laterality before marking the site Procedure checklist: Completed Consent: Before the procedure and under the influence of no sedative(s), amnesic(s), or anxiolytics, the patient was informed of the treatment options, risks and possible complications. To fulfill our ethical and legal obligations, as recommended by the American Medical Association's Code of Ethics, I have informed the patient of my clinical impression; the nature and purpose of the treatment or procedure; the risks, benefits, and possible complications of the intervention; the alternatives, including doing nothing; the risk(s) and benefit(s) of the alternative treatment(s) or procedure(s); and the risk(s) and benefit(s) of doing nothing. The patient was provided information about the general risks and possible complications associated with the procedure. These may include, but are not limited to: failure to achieve desired goals, infection, bleeding, organ or nerve damage, allergic reactions, paralysis, and death. In addition, the patient was informed of those risks and complications associated to Spine-related procedures, such as failure to decrease pain; infection (i.e.: Meningitis, epidural or intraspinal abscess); bleeding (i.e.: epidural hematoma, subarachnoid hemorrhage, or any other type of intraspinal or peri-dural  bleeding); organ or nerve damage (i.e.: Any type of peripheral nerve, nerve root, or spinal cord injury) with subsequent damage to sensory, motor, and/or autonomic systems, resulting in permanent pain, numbness, and/or weakness of one or several areas of the body; allergic reactions; (i.e.: anaphylactic reaction); and/or death. Furthermore, the patient was informed of those risks and complications associated with the medications. These include, but are not limited to: allergic reactions (i.e.: anaphylactic or anaphylactoid reaction(s)); adrenal axis suppression; blood sugar elevation that in diabetics may result in ketoacidosis or comma; water retention that in patients with history of congestive heart failure may result in shortness of breath, pulmonary edema, and decompensation with resultant heart failure; weight gain; swelling or edema; medication-induced neural toxicity; particulate matter embolism and blood vessel occlusion with resultant organ, and/or nervous system infarction; and/or aseptic necrosis of one or more joints. Finally, the patient was informed that Medicine is not an exact science; therefore, there is also the possibility of unforeseen or unpredictable risks and/or possible complications that may result in a catastrophic outcome. The patient indicated having understood very clearly. We have given the patient no guarantees and we have made no promises. Enough time was given to the patient to ask questions, all of which were answered to the patient's satisfaction. Brandy [name] has indicated that she wanted to continue with the procedure. Attestation: I, the ordering provider, attest that I have discussed with the patient the benefits, risks, side-effects, alternatives, likelihood of achieving goals, and potential problems during recovery for the procedure that I have provided informed consent. Date  Time: 07/05/2023  8:27 AM  Pre-Procedure Preparation:  Monitoring: As per clinic  protocol. Respiration, ETCO2, SpO2, BP, heart rate and rhythm monitor placed and checked for adequate function Safety Precautions: Patient was assessed for positional comfort and pressure points before starting the procedure. Time-out: I initiated and conducted the "Time-out" before starting the procedure, as per protocol. The patient was asked to participate by confirming the accuracy of the "Time Out" information. Verification of the correct person, site, and procedure were performed and confirmed by me, the nursing staff, and the patient. "Time-out" conducted as per Joint Commission's Universal Protocol (UP.01.01.01). Time: 8841  Description of Procedure:  Laterality:  Bilateral Levels:  L3, L4, and L5 Medial Branch Level(s). Safety Precautions: Aspiration looking for blood return was conducted prior to all injections. At no point did we inject any substances, as a needle was being advanced. Before injecting, the patient was told to immediately notify me if she was experiencing any new onset of "ringing in the ears, or metallic taste in the mouth". No attempts were made at seeking any paresthesias. Safe injection practices and needle disposal techniques used. Medications properly checked for expiration dates. SDV (single dose vial) medications used. After the completion of the procedure, all disposable equipment used was discarded in the proper designated medical waste containers. Local Anesthesia: Protocol guidelines were followed. The patient was positioned over the fluoroscopy table. The area was prepped in the usual manner. The time-out was completed. The target area was identified using fluoroscopy. A 12-in long, straight, sterile hemostat was used with fluoroscopic guidance to locate the targets for each level blocked. Once located, the skin was marked with an approved surgical skin marker. Once all sites were marked, the skin (epidermis, dermis, and hypodermis), as well as deeper tissues  (fat, connective tissue and muscle) were infiltrated with a small amount of a short-acting local anesthetic, loaded on a 10cc syringe with a 25G, 1.5-in  Needle. An appropriate amount of time was allowed for local anesthetics to take effect before proceeding to the next step. Technical description of process:  Radiofrequency Ablation (RFA) L3 Medial Branch Nerve RFA: The target area for the L3 medial branch is at the junction of the postero-lateral aspect of the superior articular process and the superior, posterior, and medial edge of the transverse process of L4. Under fluoroscopic guidance, a Radiofrequency needle was inserted until contact was made with os over the superior postero-lateral aspect of the pedicular shadow (target area). Sensory and motor testing was conducted to properly adjust the position of the needle. Once satisfactory placement of the needle was achieved, the numbing solution was slowly injected after negative aspiration for blood. 2.0 mL of the nerve block solution was injected without difficulty or complication. After waiting for at least 3 minutes, the ablation was performed. Once completed, the needle was removed intact. L4 Medial Branch Nerve RFA: The target area for the L4 medial branch is at the junction of the postero-lateral aspect of the superior articular process and the superior, posterior, and medial edge of the transverse process of L5. Under fluoroscopic guidance, a Radiofrequency needle was inserted until contact was made with os over the superior postero-lateral aspect of the pedicular shadow (target area). Sensory and motor testing was conducted to properly adjust the position of the needle. Once satisfactory placement of the needle was achieved, the numbing solution was slowly injected after negative aspiration for blood. 2.0 mL of the nerve block solution was injected without difficulty or complication. After waiting for at least 3 minutes, the ablation was performed.  Once completed, the needle was removed intact. L5 Medial Branch Nerve RFA: The target area for the L5 medial branch is at the junction of the postero-lateral aspect of the superior articular process of S1 and the superior, posterior, and medial edge of the sacral ala. Under fluoroscopic guidance, a Radiofrequency needle was inserted until contact was made with os over the superior postero-lateral aspect of the pedicular shadow (target area). Sensory and motor testing was conducted to properly adjust the position of the needle. Once satisfactory placement of the needle was achieved, the numbing solution was slowly injected after negative  aspiration for blood. 2.0 mL of the nerve block solution was injected without difficulty or complication. After waiting for at least 3 minutes, the ablation was performed. Once completed, the needle was removed intact.  Radiofrequency lesioning (ablation):  Radiofrequency Generator: Medtronic AccurianTM AG 1000 RF Generator Sensory Stimulation Parameters: 50 Hz was used to locate & identify the nerve, making sure that the needle was positioned such that there was no sensory stimulation below 0.3 V or above 0.7 V. Motor Stimulation Parameters: 2 Hz was used to evaluate the motor component. Care was taken not to lesion any nerves that demonstrated motor stimulation of the lower extremities at an output of less than 2.5 times that of the sensory threshold, or a maximum of 2.0 V. Lesioning Technique Parameters: Standard Radiofrequency settings. (Not bipolar or pulsed.) Temperature Settings: 80 degrees C Lesioning time: 60 seconds Intra-operative Compliance: Compliant  6cc solution made of 5cc of 0.2% ropivacaine, 1 cc of Decadron 10 mg/cc.  2 cc injected at each level above on the LEFT  6cc solution made of 5cc of 0.2% ropivacaine, 1 cc of Decadron 10 mg/cc.  2 cc injected at each level above on the RIGHT  Once the entire procedure was completed, the treated area was  cleaned, making sure to leave some of the prepping solution back to take advantage of its long term bactericidal properties.    Illustration of the posterior view of the lumbar spine and the posterior neural structures. Laminae of L2 through S1 are labeled. DPRL5, dorsal primary ramus of L5; DPRS1, dorsal primary ramus of S1; DPR3, dorsal primary ramus of L3; FJ, facet (zygapophyseal) joint L3-L4; I, inferior articular process of L4; LB1, lateral branch of dorsal primary ramus of L1; IAB, inferior articular branches from L3 medial branch (supplies L4-L5 facet joint); IBP, intermediate branch plexus; MB3, medial branch of dorsal primary ramus of L3; NR3, third lumbar nerve root; S, superior articular process of L5; SAB, superior articular branches from L4 (supplies L4-5 facet joint also); TP3, transverse process of L3.  Vitals:   07/05/23 0943 07/05/23 0948 07/05/23 0953 07/05/23 1000  BP: (!) 150/91 (!) 155/92 (!) 153/90 (!) 110/53  Pulse: 96 98 97 84  Resp: 16 14 16 20   Temp:      TempSrc:      SpO2: 99% 98% 98% 100%  Weight:      Height:        Start Time: 0933 hrs. End Time: 0953 hrs.  Imaging Guidance (Spinal):          Type of Imaging Technique: Fluoroscopy Guidance (Spinal) Indication(s): Assistance in needle guidance and placement for procedures requiring needle placement in or near specific anatomical locations not easily accessible without such assistance. Exposure Time: Please see nurses notes. Contrast: None used. Fluoroscopic Guidance: I was personally present during the use of fluoroscopy. "Tunnel Vision Technique" used to obtain the best possible view of the target area. Parallax error corrected before commencing the procedure. "Direction-depth-direction" technique used to introduce the needle under continuous pulsed fluoroscopy. Once target was reached, antero-posterior, oblique, and lateral fluoroscopic projection used confirm needle placement in all planes. Images permanently  stored in EMR. Interpretation: No contrast injected. I personally interpreted the imaging intraoperatively. Adequate needle placement confirmed in multiple planes. Permanent images saved into the patient's record.  Antibiotic Prophylaxis:   Anti-infectives (From admission, onward)    None      Indication(s): None identified  Post-operative Assessment:  Post-procedure Vital Signs:  Pulse/HCG Rate: 84  Temp: Marland Kitchen)  97.3 F (36.3 C) Resp: 20 BP: (!) 110/53 SpO2: 100 %  EBL: None  Complications: No immediate post-treatment complications observed by team, or reported by patient.  Note: The patient tolerated the entire procedure well. A repeat set of vitals were taken after the procedure and the patient was kept under observation following institutional policy, for this type of procedure. Post-procedural neurological assessment was performed, showing return to baseline, prior to discharge. The patient was provided with post-procedure discharge instructions, including a section on how to identify potential problems. Should any problems arise concerning this procedure, the patient was given instructions to immediately contact us, at any time, without hesitation. In any case, we plan to contact the patient by telephone for a follow-up status report regarding this interventional procedure.  Comments:  No additional relevant information.  Plan of Care  Orders:  Orders Placed This Encounter  Procedures   DG PAIN [facility] C-ARM 1-60 MIN NO REPORT    Intraoperative interpretation by procedural physician at [facility].    Standing Status:   Standing    Number of Occurrences:   1    Reason for exam::   Assistance in needle guidance and placement for procedures requiring needle placement in or near specific anatomical locations not easily accessible without such assistance.    Medications ordered for procedure: Meds ordered this encounter  Medications   lidocaine (XYLOCAINE) 2 %  (with pres) injection 400 mg   lactated ringers infusion   midazolam (VERSED) injection 0.5-2 mg    Make sure Flumazenil is available in the pyxis when using this medication. If oversedation occurs, administer 0.2 mg IV over 15 sec. If after 45 sec no response, administer 0.2 mg again over 1 min; may repeat at 1 min intervals; not to exceed 4 doses (1 mg)   ropivacaine (PF) 2 mg/mL (0.2%) (NAROPIN) injection 18 mL   dexamethasone (DECADRON) injection 20 mg   Medications administered: We administered lidocaine, lactated ringers, midazolam, ropivacaine (PF) 2 mg/mL (0.2%), and dexamethasone.  See the medical record for exact dosing, route, and time of administration.  Follow-up plan:   Return for Keep sch. appt.       Interventional management options:    Lumbar RFA; right L3, L4, L5 06/01/2022, left 06/27/22. B/L L3,4,5 RFA #2 07/05/23 SCS trial; appropriate windows Sprint PNS medial branch Diagnostic left sacroiliac joint block  Diagnostic right IA hip joint injection       Recent Visits Date Type Provider Dept  06/13/23 Office Visit Edward Jolly, MD [facility]  Showing recent visits within past 90 days and meeting all other requirements Today's Visits Date Type Provider Dept  07/05/23 Procedure visit Edward Jolly, MD [facility]  Showing today's visits and meeting all other requirements Future Appointments Date Type Provider Dept  09/12/23 Appointment Edward Jolly, MD [facility]  Showing future appointments within next 90 days and meeting all other requirements  Disposition: Discharge home  Discharge (Date  Time): 07/05/2023; 1010 hrs.   Primary Care Physician: Caesar Bookman, NP Location: [facility] Note by: Edward Jolly, MD Date: 07/05/2023; Time: 10:30 AM  Disclaimer:  Medicine is not an exact science. The only guarantee in medicine is that nothing is guaranteed. It is important to note that the decision  to proceed with this intervention was based on the information collected from the patient. The Data and conclusions were drawn from the patient's questionnaire, the interview, and the physical examination. Because the information  was provided in large part by the patient, it cannot be guaranteed that it has not been purposely or unconsciously manipulated. Every effort has been made to obtain as much relevant data as possible for this evaluation. It is important to note that the conclusions that lead to this procedure are derived in large part from the available data. Always take into account that the treatment will also be dependent on availability of resources and existing treatment guidelines, considered by other Pain Management Practitioners as being common knowledge and practice, at the time of the intervention. For Medico-Legal purposes, it is also important to point out that variation in procedural techniques and pharmacological choices are the acceptable norm. The indications, contraindications, technique, and results of the above procedure should only be interpreted and judged by a Board-Certified Interventional Pain Specialist with extensive familiarity and expertise in the same exact procedure and technique.

## 2023-07-06 ENCOUNTER — Telehealth: Payer: Self-pay

## 2023-07-06 NOTE — Telephone Encounter (Signed)
Post procedure follow up.  Patient states he is doing ok,.

## 2023-07-10 ENCOUNTER — Other Ambulatory Visit: Payer: Self-pay | Admitting: Hematology

## 2023-07-10 DIAGNOSIS — C50919 Malignant neoplasm of unspecified site of unspecified female breast: Secondary | ICD-10-CM

## 2023-07-12 DIAGNOSIS — S0502XA Injury of conjunctiva and corneal abrasion without foreign body, left eye, initial encounter: Secondary | ICD-10-CM | POA: Diagnosis not present

## 2023-07-12 DIAGNOSIS — H1820 Unspecified corneal edema: Secondary | ICD-10-CM | POA: Diagnosis not present

## 2023-07-12 DIAGNOSIS — Z4421 Encounter for fitting and adjustment of artificial right eye: Secondary | ICD-10-CM | POA: Diagnosis not present

## 2023-07-20 ENCOUNTER — Ambulatory Visit (INDEPENDENT_AMBULATORY_CARE_PROVIDER_SITE_OTHER): Payer: Medicare Other | Admitting: Family

## 2023-07-20 ENCOUNTER — Other Ambulatory Visit: Payer: Self-pay | Admitting: *Deleted

## 2023-07-20 ENCOUNTER — Encounter: Payer: Self-pay | Admitting: Family

## 2023-07-20 VITALS — BP 108/60 | HR 100 | Temp 97.7°F | Resp 20 | Ht 68.0 in | Wt 208.6 lb

## 2023-07-20 DIAGNOSIS — R296 Repeated falls: Secondary | ICD-10-CM | POA: Diagnosis not present

## 2023-07-20 DIAGNOSIS — N1831 Chronic kidney disease, stage 3a: Secondary | ICD-10-CM | POA: Diagnosis not present

## 2023-07-20 DIAGNOSIS — I951 Orthostatic hypotension: Secondary | ICD-10-CM | POA: Diagnosis not present

## 2023-07-20 DIAGNOSIS — M5416 Radiculopathy, lumbar region: Secondary | ICD-10-CM

## 2023-07-20 DIAGNOSIS — R2681 Unsteadiness on feet: Secondary | ICD-10-CM | POA: Diagnosis not present

## 2023-07-20 DIAGNOSIS — I10 Essential (primary) hypertension: Secondary | ICD-10-CM | POA: Diagnosis not present

## 2023-07-20 DIAGNOSIS — C50919 Malignant neoplasm of unspecified site of unspecified female breast: Secondary | ICD-10-CM | POA: Diagnosis not present

## 2023-07-20 DIAGNOSIS — E669 Obesity, unspecified: Secondary | ICD-10-CM

## 2023-07-20 MED ORDER — PREDNISONE 20 MG PO TABS: ORAL_TABLET | ORAL | 0 refills | Status: AC

## 2023-07-20 NOTE — Patient Instructions (Signed)
-   Please get lumbar and femur  X-ray at [facility] imaging at [facility] then will call you with results.

## 2023-07-20 NOTE — Progress Notes (Signed)
Provider: Richarda Blade FNP-C  Betina Puckett, Donalee Citrin, NP  Patient Care Team: Kolden Dupee, Donalee Citrin, NP as PCP - General (Family Medicine) Thomasene Ripple, DO as PCP - Cardiology (Cardiology) Elie Confer, NP as Nurse Practitioner  Extended Emergency Contact Information Primary Emergency Contact: [name],Charles Address: [address]          [place], [place] [address] [place] Home Phone: [phone] Relation: Spouse Secondary Emergency Contact: [name],Michele Address: [address]          [place], [place] [address] [place] Home Phone: [phone] Relation: Daughter  Code Status:  Full Code  Goals of care: Advanced Directive information    07/20/2023   10:39 AM  Advanced Directives  Does Patient Have a Medical Advance Directive? No  Would patient like information on creating a medical advance directive? No - Patient declined     Chief Complaint  Patient presents with   Acute Visit    Pt c/o back pain and having trouble walking on leg.    Discussed the use of AI scribe software for clinical note transcription with the patient, who gave verbal consent to proceed.  History of Present Illness   Brandy [name] is a 58 year old female who presents with worsening back pain after a fall.  She has been experiencing worsening back pain following a fall last month during a snowstorm. She slipped on ice and fell, initially without pain, but began experiencing consistent pain about a week later. The pain is primarily located in the lower back, radiating more to the left side, with tenderness in the buttocks and back of the thigh. On February 9th, she experienced severe pain radiating from her left foot up her leg, causing her to fall back into bed. No current pain radiating down the leg, weakness, tingling or numbness. No swelling or bruising in the affected areas. She has a history of back pain and underwent a nerve ablation  procedure at the end of January to manage her symptoms.  She has issues with her eyes, specifically dry eyes, requiring the use of artificial tears every hour. She has a prosthetic eye and uses lubricant and tapes her left eyelid shut at night to prevent dryness. Her vision can be distorted at times, affecting her daily activities.  She has difficulty with mobility, especially navigating stairs in her current living situation, which requires her to carry her walker and cane up and down stairs.   Past Medical History:  Diagnosis Date   Acute pansinusitis 08/02/2017   Anxiety    Arthritis    ASD (atrial septal defect)    s/p closure with Amplatzer device 10/05/04 (Dr. Celso Amy, [facility]) 10/05/04   Cancer [name])    Cataract    Chronic pain    CKD (chronic kidney disease)    Dyspnea    Fatty liver 09/04/2019   GERD (gastroesophageal reflux disease)    Heart murmur    no longer heard   History of hiatal hernia    Hyperlipidemia    Legally blind in right eye, as defined in [place]    Lumbar herniated disc    Migraines    On home oxygen therapy 06/15/2022   Pt was given O2 on D/C from [facility] 04/2022   OSA on CPAP 06/15/2022   PONV (postoperative nausea and vomiting)    Sciatica    Past Surgical History:  Procedure Laterality Date   ABLATION  BREAST LUMPECTOMY WITH RADIOACTIVE SEED AND SENTINEL LYMPH NODE BIOPSY Right 11/23/2020   Procedure: RIGHT BREAST LUMPECTOMY WITH RADIOACTIVE SEED AND RIGHT AXILLARY SENTINEL LYMPH NODE BIOPSY;  Surgeon: Emelia Loron, MD;  Location: [facility] OR;  Service: General;  Laterality: Right;   BREAST SURGERY Bilateral 2011   Breast Reduction Surgery   BUNIONECTOMY     CARDIAC CATHETERIZATION     10/05/04 [facility]): LM < 25%, otherwise normal coronaries. No pulmonary HTN, Mildly enlarged RV. Secundum ASD s/p closure.   CARDIAC SURGERY     CATARACT EXTRACTION     CLEFT PALATE REPAIR     s/p cleft lip and palate repair   EYE SURGERY Right 2019   right eye  removed   LUMBAR LAMINECTOMY/DECOMPRESSION MICRODISCECTOMY Left 05/23/2016   Procedure: LEFT L4-L5 LATERAL RECESS DECOMPRESSION WITH CENTRAL AND RIGHT DECOMPRESSION VIA LEFT SIDE;  Surgeon: Kerrin Champagne, MD;  Location: [facility] OR;  Service: Orthopedics;  Laterality: Left;   LUMBAR LAMINECTOMY/DECOMPRESSION MICRODISCECTOMY Left 05/23/2016   Procedure: LUMBAR LAMINECTOMY/DECOMPRESSION MICRODISCECTOMY Lumbar five - Sacral One 1 LEVEL;  Surgeon: Kerrin Champagne, MD;  Location: [facility] OR;  Service: Orthopedics;  Laterality: Left;   REDUCTION MAMMAPLASTY  2011   SHOULDER INJECTION Left 05/23/2016   Procedure: SHOULDER INJECTION;  Surgeon: Kerrin Champagne, MD;  Location: [facility] OR;  Service: Orthopedics;  Laterality: Left;  band-aid per pa-c   TRANSTHORACIC ECHOCARDIOGRAM     12/15/05 [facility]): Mild LVH, EF > 55%, grade 1 diastolic dysfunction, Trivial MR/PR/TR.   TUBAL LIGATION      Allergies  Allergen Reactions   Zithromax [Azithromycin] Shortness Of Breath, Itching and Other (See Comments)    TOTAL BODY ITCHING [EVEN SOLES OF FEET] WHEEZING    Tramadol Itching and Other (See Comments)    Has taken recently without any side effects.   Psyllium Nausea And Vomiting and Other (See Comments)    Metamucil. Sneezing      Outpatient Encounter Medications as of 07/20/2023  Medication Sig   acetaminophen (TYLENOL) 500 MG tablet Take 500 mg by mouth daily as needed for moderate pain.   albuterol (VENTOLIN HFA) 108 (90 Base) MCG/ACT inhaler Inhale 2 puffs into the lungs every 6 (six) hours as needed for wheezing or shortness of breath.   ALPRAZolam (XANAX) 1 MG tablet TAKE 1 TABLET BY MOUTH AT BEDTIME AS NEEDED FOR ANXIETY   amitriptyline (ELAVIL) 75 MG tablet Take 1 tablet (75 mg total) by mouth at bedtime.   Buprenorphine HCl (BELBUCA) 300 MCG FILM Place 300 mcg inside cheek 2 (two) times daily.   Carboxymethylcellul-Glycerin (LUBRICATING EYE DROPS OP) Place 1 drop into the right eye in the morning, at noon, and at  bedtime.   cyanocobalamin (VITAMIN B12) 500 MCG tablet Take 1 tablet (500 mcg total) by mouth daily.   diphenoxylate-atropine (LOMOTIL) 2.5-0.025 MG tablet Take 1 tablet by mouth 3 (three) times daily as needed for diarrhea or loose stools.   Evolocumab (REPATHA SURECLICK) 140 MG/ML SOAJ Inject 140 mg into the skin every 14 (fourteen) days.   fluticasone-salmeterol (ADVAIR) 250-50 MCG/ACT AEPB Inhale 1 puff into the lungs in the morning and at bedtime.   ketoconazole (NIZORAL) 2 % cream Apply 1 Application topically 2 (two) times daily.   loperamide (IMODIUM) 2 MG capsule Take 1-2 capsules (2-4 mg total) by mouth 4 (four) times daily as needed for diarrhea or loose stools.   methocarbamol (ROBAXIN) 500 MG tablet Take 1 tablet (500 mg total) by mouth every 6 (six) hours as  needed for muscle spasms.   midodrine (PROAMATINE) 10 MG tablet TAKE 1 TABLET BY MOUTH IN THE  MORNING AND AT BEDTIME   Multiple Vitamin (MULTIVITAMIN WITH MINERALS) TABS tablet Take 1 tablet by mouth daily.   omeprazole (PRILOSEC OTC) 20 MG tablet Take 20 mg by mouth daily.   predniSONE (DELTASONE) 20 MG tablet Take 2 tablets (40 mg total) by mouth daily with breakfast for 1 day, THEN 1.5 tablets (30 mg total) daily with breakfast for 1 day, THEN 1 tablet (20 mg total) daily with breakfast for 1 day, THEN 0.5 tablets (10 mg total) daily with breakfast for 1 day.   PRESCRIPTION MEDICATION CPAP- At bedtime   triamcinolone cream (KENALOG) 0.1 % Apply 1 Application topically 2 (two) times daily.   VERZENIO 100 MG tablet TAKE 1 TABLET BY MOUTH TWICE DAILY   Vitamin D, Ergocalciferol, (DRISDOL) 1.25 MG (50000 UNIT) CAPS capsule TAKE 1 CAPSULE BY MOUTH EVERY 7  DAYS ( MONDAY )   [EXPIRED] Buprenorphine HCl (BELBUCA) 300 MCG FILM Place 1 Film inside cheek every 12 (twelve) hours.   XIIDRA 5 % SOLN Place 1 drop into the left eye 2 (two) times daily. (Patient not taking: Reported on 07/20/2023)   No facility-administered encounter  medications on file as of 07/20/2023.    Review of Systems  Constitutional:  Negative for appetite change, chills, fatigue, fever and unexpected weight change.  HENT:  Negative for congestion, ear discharge, ear pain, hearing loss, nosebleeds, postnasal drip, rhinorrhea, sinus pressure, sinus pain, sneezing and sore throat.   Eyes:  Negative for pain, discharge, redness, itching and visual disturbance.       Left dry eye follows up with Ophthalmology   Respiratory:  Negative for cough, chest tightness, shortness of breath and wheezing.   Cardiovascular:  Negative for chest pain, palpitations and leg swelling.  Gastrointestinal:  Negative for abdominal distention, abdominal pain, blood in stool, constipation, diarrhea, nausea and vomiting.  Musculoskeletal:  Positive for arthralgias, back pain and gait problem. Negative for joint swelling, myalgias, neck pain and neck stiffness.       Fall episodes   Skin:  Negative for color change, pallor, rash and wound.  Neurological:  Negative for dizziness, syncope, speech difficulty, weakness, light-headedness, numbness and headaches.  Hematological:  Does not bruise/bleed easily.    Immunization History  Administered Date(s) Administered   Hepatitis A 03/02/2023   Hepatitis B 03/02/2023   Influenza Inj Mdck Quad Pf 04/25/2019   Influenza,inj,Quad PF,6+ Mos 02/24/2020, 04/15/2022, 02/21/2023   Influenza,inj,quad, With Preservative 04/25/2019   Influenza-Unspecified 05/26/2021   Moderna Sars-Covid-2 Vaccination 06/29/2019, 08/03/2019, 06/24/2020   PFIZER(Purple Top)SARS-COV-2 Vaccination 05/26/2021   Pneumococcal Conjugate-13 05/29/2019   Pneumococcal Polysaccharide-23 07/29/2019   Tdap 02/21/2023   Unspecified SARS-COV-2 Vaccination 02/21/2023   Zoster Recombinant(Shingrix) 02/17/2020, 04/20/2020   Pertinent  Health Maintenance Due  Topic Date Due   MAMMOGRAM  09/13/2024   Colonoscopy  08/16/2027   INFLUENZA VACCINE  Completed       03/15/2023    8:44 AM 03/23/2023    1:25 PM 06/13/2023    8:17 AM 07/05/2023    8:32 AM 07/20/2023   10:38 AM  Fall Risk  Falls in the past year? 1 1 1 1 1   Was there an injury with Fall? 0 0 1 0 0  Was there an injury with Fall? - Comments   fx left ankle 9/24    Fall Risk Category Calculator 2 1 3 2 2   Patient at Risk  for Falls Due to History of fall(s) History of fall(s)     Fall risk Follow up Falls evaluation completed Falls evaluation completed;Education provided;Falls prevention discussed      Functional Status Survey:    Vitals:   07/20/23 1044  BP: 108/60  Pulse: 100  Resp: 20  Temp: 97.7 F (36.5 C)  SpO2: 97%  Weight: 208 lb 9.6 oz (94.6 kg)  Height: 5\' 8"  (1.727 m)   Body mass index is 31.72 kg/m. Physical Exam  VITALS: T- 97.7, P- 100, BP- 108/60, SaO2- 97% MEASUREMENTS: WT- 208.6 HEENT: Dry eyes, right eye prosthesis CARDIOVASCULAR: Heart rate regular, No murmur  RESPIRATORY: Lungs clear to Auscultation  EXTREMITIES: No edema or bruises on legs or knees MUSCULOSKELETAL: No pain on shoulder movement, tenderness in lower back and posterior thigh, no tenderness along spine, Negative bilateral Leg raise  NEUROLOGICAL: Able to lift legs without pain, unsteady gait  SKIN: No swelling or bruising on back and hip PSYCHIATRY /BEHAVIORAL: Mood stable      Labs reviewed: Recent Labs    07/27/22 0257 07/28/22 0249 08/08/22 1421 08/19/22 0719 03/15/23 0927 04/03/23 1311 04/26/23 0831 05/26/23 1127 06/23/23 0811  NA 136   < > 141   < > 141   < > 142 139 139  K 3.9   < > 4.2   < > 4.2   < > 3.8 4.3 4.0  CL 99   < > 99   < > 102   < > 106 104 102  CO2 25   < > 30   < > 30   < > 30 30 28   GLUCOSE 228*   < > 82   < > 84   < > 98 86 114*  BUN 21*   < > 10   < > 18   < > 22* 22* 25*  CREATININE 0.98   < > 1.28*   < > 1.24*   < > 1.50* 1.33* 1.29*  CALCIUM 9.4   < > 9.5   < > 9.4   < > 9.6 9.2 9.4  MG 2.0  --  1.2*  --  1.6  --   --   --   --    < > = values in  this interval not displayed.   Recent Labs    04/26/23 0831 05/26/23 1127 06/23/23 0811  AST 84* 67* 73*  ALT 90* 54* 72*  ALKPHOS 277* 239* 236*  BILITOT 0.8 0.8 1.0  PROT 7.4 6.6 7.4  ALBUMIN 3.7 3.5 3.8   Recent Labs    04/26/23 0831 05/26/23 1127 06/23/23 0811  WBC 3.8* 3.1* 4.6  NEUTROABS 1.1* 0.8* 2.0  HGB 12.3 11.7* 11.8*  HCT 36.6 33.1* 34.9*  MCV 106.1* 109.2* 105.8*  PLT 186 179 215   Lab Results  Component Value Date   TSH 1.95 03/15/2023   Lab Results  Component Value Date   HGBA1C 5.3 07/26/2022   Lab Results  Component Value Date   CHOL 356 (H) 05/30/2023   HDL 122 05/30/2023   LDLCALC 189 (H) 05/30/2023   TRIG 243 (H) 05/30/2023   CHOLHDL 2.9 05/30/2023    Significant Diagnostic Results in last 30 days:  DG PAIN CLINIC C-ARM 1-60 MIN NO REPORT Result Date: 07/05/2023 Fluoro was used, but no Radiologist interpretation will be provided. Please refer to "NOTES" tab for provider progress note.   Assessment/Plan  Lumbar back pain with radiculopathy affecting left lower extremity  Chronic  lower back pain exacerbated by a fall on ice last month. Pain is more consistent now, primarily on the left side but also affecting the right side and buttocks. No swelling, bruising, or radiating pain. No numbness reported. Discussed need for imaging to rule out fractures or other injuries. Patient prefers to start physical therapy, including water therapy, to improve mobility and reduce pain. - Order lumbar spine and left hip x-ray at [facility] - Prescribe prednisone taper starting at 40 mg - Refer to outpatient physical therapy, including water therapy at [facility] - DG Lumbar Spine Complete; Future - predniSONE (DELTASONE) 20 MG tablet; Take 2 tablets (40 mg total) by mouth daily with breakfast for 1 day, THEN 1.5 tablets (30 mg total) daily with breakfast for 1 day, THEN 1 tablet (20 mg total) daily with breakfast for 1 day, THEN 0.5 tablets (10 mg  total) daily with breakfast for 1 day.  Dispense: 5 tablet; Refill: 0 - DG Hip Unilat W OR W/O Pelvis 2-3 Views Left; Future - Ambulatory referral to Physical Therapy  Unsteady gait /Frequent falls  - Fall and safety precaution  - - Refer to outpatient physical therapy, including water therapy at [facility] - Ambulatory referral to Physical Therapy  Hypertension Blood pressure is well-controlled at 108/60 mmHg with current medication regimen. - Continue current antihypertensive medication  Obesity Weight increased to 208.6 lbs. Patient acknowledges the need for weight loss and is encouraged to continue walking and dietary modifications. Discussed benefits of regular exercise and dietary changes, including the use of the [employer] program and keeping a journal for tracking progress. - Encourage regular walking and dietary changes - Consider referral to a dietitian if needed  Dry Eye Syndrome Severe dry eyes requiring hourly artificial tears. Patient reports distorted vision and uses Lacri-Lube at night with eyelid taping. Discussed importance of consistent use of artificial tears and night-time lubrication to prevent further complications. - Continue using artificial tears hourly - Apply Lacri-Lube at night and tape eyelids closed   General Health Maintenance Discussed importance of regular exercise and a balanced diet. Advised on incorporating iron-rich foods like dark leafy greens, beets, and black beans into diet to manage anemia. - Encourage use of [employer] program for exercise - Recommend keeping a journal for exercise and diet tracking - Advise on incorporating iron-rich foods like dark leafy greens, beets, and black beans into diet  Follow-up - Follow up with imaging results - Schedule physical therapy sessions - Monitor response to prednisone and adjust treatment as necessary.   Family/ staff Communication: Reviewed plan of care with patient verbalized  understanding   Labs/tests ordered: None   Next Appointment: Return if symptoms worsen or fail to improve.   Total time: 30 minutes. Greater than 50% of total time spent doing patient education regarding Lower back pain,HTN,Frequent fall,Dry eye syndrome and health maintenance including symptom/medication management.   Caesar Bookman, NP

## 2023-07-21 ENCOUNTER — Inpatient Hospital Stay: Payer: Medicare Other

## 2023-07-21 ENCOUNTER — Inpatient Hospital Stay: Payer: Medicare Other | Attending: Hematology

## 2023-07-21 VITALS — BP 142/53 | HR 93 | Temp 97.8°F | Resp 19

## 2023-07-21 DIAGNOSIS — Z170 Estrogen receptor positive status [ER+]: Secondary | ICD-10-CM | POA: Insufficient documentation

## 2023-07-21 DIAGNOSIS — C50511 Malignant neoplasm of lower-outer quadrant of right female breast: Secondary | ICD-10-CM | POA: Insufficient documentation

## 2023-07-21 DIAGNOSIS — C7800 Secondary malignant neoplasm of unspecified lung: Secondary | ICD-10-CM | POA: Diagnosis not present

## 2023-07-21 DIAGNOSIS — Z87891 Personal history of nicotine dependence: Secondary | ICD-10-CM | POA: Insufficient documentation

## 2023-07-21 DIAGNOSIS — E876 Hypokalemia: Secondary | ICD-10-CM | POA: Diagnosis not present

## 2023-07-21 DIAGNOSIS — Z5111 Encounter for antineoplastic chemotherapy: Secondary | ICD-10-CM | POA: Diagnosis not present

## 2023-07-21 DIAGNOSIS — C50919 Malignant neoplasm of unspecified site of unspecified female breast: Secondary | ICD-10-CM

## 2023-07-21 DIAGNOSIS — Z79899 Other long term (current) drug therapy: Secondary | ICD-10-CM | POA: Insufficient documentation

## 2023-07-21 DIAGNOSIS — Z79818 Long term (current) use of other agents affecting estrogen receptors and estrogen levels: Secondary | ICD-10-CM | POA: Insufficient documentation

## 2023-07-21 LAB — CBC WITH DIFFERENTIAL (CANCER CENTER ONLY)
Abs Immature Granulocytes: 0.01 10*3/uL (ref 0.00–0.07)
Basophils Absolute: 0 10*3/uL (ref 0.0–0.1)
Basophils Relative: 1 %
Eosinophils Absolute: 0 10*3/uL (ref 0.0–0.5)
Eosinophils Relative: 1 %
HCT: 34 % — ABNORMAL LOW (ref 36.0–46.0)
Hemoglobin: 11.4 g/dL — ABNORMAL LOW (ref 12.0–15.0)
Immature Granulocytes: 0 %
Lymphocytes Relative: 58 %
Lymphs Abs: 2.4 10*3/uL (ref 0.7–4.0)
MCH: 36.1 pg — ABNORMAL HIGH (ref 26.0–34.0)
MCHC: 33.5 g/dL (ref 30.0–36.0)
MCV: 107.6 fL — ABNORMAL HIGH (ref 80.0–100.0)
Monocytes Absolute: 0.4 10*3/uL (ref 0.1–1.0)
Monocytes Relative: 9 %
Neutro Abs: 1.3 10*3/uL — ABNORMAL LOW (ref 1.7–7.7)
Neutrophils Relative %: 31 %
Platelet Count: 191 10*3/uL (ref 150–400)
RBC: 3.16 MIL/uL — ABNORMAL LOW (ref 3.87–5.11)
RDW: 13.4 % (ref 11.5–15.5)
WBC Count: 4.2 10*3/uL (ref 4.0–10.5)
nRBC: 0 % (ref 0.0–0.2)

## 2023-07-21 LAB — CMP (CANCER CENTER ONLY)
ALT: 57 U/L — ABNORMAL HIGH (ref 0–44)
AST: 67 U/L — ABNORMAL HIGH (ref 15–41)
Albumin: 3.5 g/dL (ref 3.5–5.0)
Alkaline Phosphatase: 236 U/L — ABNORMAL HIGH (ref 38–126)
Anion gap: 7 (ref 5–15)
BUN: 17 mg/dL (ref 6–20)
CO2: 29 mmol/L (ref 22–32)
Calcium: 9.3 mg/dL (ref 8.9–10.3)
Chloride: 104 mmol/L (ref 98–111)
Creatinine: 1.43 mg/dL — ABNORMAL HIGH (ref 0.44–1.00)
GFR, Estimated: 43 mL/min — ABNORMAL LOW (ref >60)
Glucose, Bld: 97 mg/dL (ref 70–99)
Potassium: 4 mmol/L (ref 3.5–5.1)
Sodium: 140 mmol/L (ref 135–145)
Total Bilirubin: 0.8 mg/dL (ref 0.0–1.2)
Total Protein: 6.8 g/dL (ref 6.5–8.1)

## 2023-07-21 MED ORDER — FULVESTRANT 250 MG/5ML IM SOSY
500.0000 mg | PREFILLED_SYRINGE | Freq: Once | INTRAMUSCULAR | Status: AC
  Administered 2023-07-21: 500 mg via INTRAMUSCULAR
  Filled 2023-07-21: qty 10

## 2023-07-25 DIAGNOSIS — H16402 Unspecified corneal neovascularization, left eye: Secondary | ICD-10-CM | POA: Diagnosis not present

## 2023-07-25 DIAGNOSIS — H18832 Recurrent erosion of cornea, left eye: Secondary | ICD-10-CM | POA: Diagnosis not present

## 2023-07-26 ENCOUNTER — Ambulatory Visit
Admission: RE | Admit: 2023-07-26 | Discharge: 2023-07-26 | Disposition: A | Discharge status: Home / Self Care | Payer: Medicare Other | Source: Ambulatory Visit | Attending: Family | Admitting: Family

## 2023-07-26 ENCOUNTER — Ambulatory Visit: Payer: Medicare Other | Attending: Family | Admitting: Physical Therapy

## 2023-07-26 ENCOUNTER — Encounter: Payer: Self-pay | Admitting: Physical Therapy

## 2023-07-26 ENCOUNTER — Other Ambulatory Visit: Payer: Self-pay

## 2023-07-26 DIAGNOSIS — M5416 Radiculopathy, lumbar region: Secondary | ICD-10-CM | POA: Insufficient documentation

## 2023-07-26 DIAGNOSIS — R2689 Other abnormalities of gait and mobility: Secondary | ICD-10-CM | POA: Diagnosis not present

## 2023-07-26 DIAGNOSIS — R296 Repeated falls: Secondary | ICD-10-CM | POA: Diagnosis not present

## 2023-07-26 DIAGNOSIS — M6281 Muscle weakness (generalized): Secondary | ICD-10-CM | POA: Diagnosis not present

## 2023-07-26 DIAGNOSIS — R2681 Unsteadiness on feet: Secondary | ICD-10-CM | POA: Insufficient documentation

## 2023-07-26 DIAGNOSIS — R293 Abnormal posture: Secondary | ICD-10-CM | POA: Diagnosis not present

## 2023-07-26 DIAGNOSIS — M160 Bilateral primary osteoarthritis of hip: Secondary | ICD-10-CM | POA: Diagnosis not present

## 2023-07-26 DIAGNOSIS — M5459 Other low back pain: Secondary | ICD-10-CM | POA: Diagnosis not present

## 2023-07-26 NOTE — Therapy (Signed)
OUTPATIENT PHYSICAL THERAPY NEURO EVALUATION   Patient Name: Brandy [name] MRN: [id number] DOB:[birth year], 58 y.o., female Today's Date: 07/26/2023   PCP: Caesar Bookman, NP  REFERRING PROVIDER: Caesar Bookman, NP   END OF SESSION:  PT End of Session - 07/26/23 0810     Visit Number 1    Number of Visits 17    Date for PT Re-Evaluation 09/22/23    Authorization Type [employer] Medicare-auth submitted at eval    Progress Note Due on Visit 10    PT Start Time 0804    PT Stop Time 0906    PT Time Calculation (min) 62 min    Activity Tolerance Patient tolerated treatment well    Behavior During Therapy [facility] for tasks assessed/performed             Past Medical History:  Diagnosis Date   Acute pansinusitis 08/02/2017   Anxiety    Arthritis    ASD (atrial septal defect)    s/p closure with Amplatzer device 10/05/04 (Dr. Celso Wayburn Shaler, [facility]) 10/05/04   [facility]cer [facility])    Cataract    Chronic pain    CKD (chronic kidney disease)    Dyspnea    Fatty liver 09/04/2019   GERD (gastroesophageal reflux disease)    Heart murmur    no longer heard   History of hiatal hernia    Hyperlipidemia    Legally blind in right eye, as defined in [place]    Lumbar herniated disc    Migraines    On home oxygen therapy 06/15/2022   Pt was given O2 on D/C from [facility] 04/2022   OSA on CPAP 06/15/2022   PONV (postoperative nausea and vomiting)    Sciatica    Past Surgical History:  Procedure Laterality Date   ABLATION     BREAST LUMPECTOMY WITH RADIOACTIVE SEED AND SENTINEL LYMPH NODE BIOPSY Right 11/23/2020   Procedure: RIGHT BREAST LUMPECTOMY WITH RADIOACTIVE SEED AND RIGHT AXILLARY SENTINEL LYMPH NODE BIOPSY;  Surgeon: Emelia Loron, MD;  Location: [facility] OR;  Service: General;  Laterality: Right;   BREAST SURGERY Bilateral 2011   Breast Reduction Surgery   BUNIONECTOMY     CARDIAC CATHETERIZATION     10/05/04 [facility]): LM < 25%, otherwise normal coronaries. No pulmonary HTN,  Mildly enlarged RV. Secundum ASD s/p closure.   CARDIAC SURGERY     CATARACT EXTRACTION     CLEFT PALATE REPAIR     s/p cleft lip and palate repair   EYE SURGERY Right 2019   right eye removed   LUMBAR LAMINECTOMY/DECOMPRESSION MICRODISCECTOMY Left 05/23/2016   Procedure: LEFT L4-L5 LATERAL RECESS DECOMPRESSION WITH CENTRAL AND RIGHT DECOMPRESSION VIA LEFT SIDE;  Surgeon: Kerrin Champagne, MD;  Location: [facility] OR;  Service: Orthopedics;  Laterality: Left;   LUMBAR LAMINECTOMY/DECOMPRESSION MICRODISCECTOMY Left 05/23/2016   Procedure: LUMBAR LAMINECTOMY/DECOMPRESSION MICRODISCECTOMY Lumbar five - Sacral One 1 LEVEL;  Surgeon: Kerrin Champagne, MD;  Location: [facility] OR;  Service: Orthopedics;  Laterality: Left;   REDUCTION MAMMAPLASTY  2011   SHOULDER INJECTION Left 05/23/2016   Procedure: SHOULDER INJECTION;  Surgeon: Kerrin Champagne, MD;  Location: [facility] OR;  Service: Orthopedics;  Laterality: Left;  band-aid per pa-c   TRANSTHORACIC ECHOCARDIOGRAM     12/15/05 [facility]): Mild LVH, EF > 55%, grade 1 diastolic dysfunction, Trivial MR/PR/TR.   TUBAL LIGATION     Patient Active Problem List   Diagnosis Date Noted   Chronic obstructive pulmonary disease, unspecified COPD type (HCC)  03/15/2023   Major depressive disorder, single episode, severe without psychotic features (HCC) 03/15/2023   Chronic respiratory failure with hypoxia (HCC) 05/08/2022   Orthostatic hypotension 04/24/2022   Hyperbilirubinemia 04/24/2022   Snoring 07/14/2021   Fatigue 07/14/2021   Daytime somnolence 07/14/2021   Status post device closure of ASD 06/03/2021   Secundum ASD 06/03/2021   Decreased diffusion capacity 05/14/2021   Dyspnea 03/04/2021   Pulmonary nodules 04/01/2020   Primary malignant neoplasm of breast with metastasis (HCC) 02/24/2020   Spondylosis without myelopathy or radiculopathy, lumbosacral region 12/31/2019   Chronic low back pain (Bilateral) w/o sciatica 12/31/2019   Chronic hip pain (3ry area of Pain)  (Bilateral) (R>L) 11/27/2019   Chronic sacroiliac joint pain (Left) 11/27/2019   Chronic pain syndrome 11/11/2019   Pharmacologic therapy 11/11/2019   Disorder of skeletal system 11/11/2019   Problems influencing health status 11/11/2019   Abnormal MRI, lumbar spine (08/22/2018) 11/11/2019   Chronic low back pain (1ry area of Pain) (Bilateral) w/ sciatica (Bilateral) 11/11/2019   DDD (degenerative disc disease), lumbosacral 11/11/2019   Grade 1 Anterolisthesis (L2-3, L3-4) 11/11/2019   Grade 1 Retrolisthesis (L4-5, L5-S1) 11/11/2019   Lumbar facet hypertrophy (Multilevel) (Bilateral) 11/11/2019   Lumbar facet syndrome (Multilevel) (Bilateral) (R>L) 11/11/2019   Chronic lower extremity pain (2ry area of Pain) (Bilateral) (R>L) 11/11/2019   Neurogenic pain 11/11/2019   Chronic musculoskeletal pain 11/11/2019   Infiltrating ductal carcinoma of breast (HCC) 10/08/2019   Fatty liver 09/04/2019   Nausea & vomiting 09/04/2019   Obesity (BMI 30-39.9) 09/04/2019   GERD (gastroesophageal reflux disease)    Hyperlipidemia    CKD (chronic kidney disease), stage III (HCC)    Depression    Malignant hypertension    Ocular proptosis 02/14/2019   PVD (posterior vitreous detachment), left eye 02/14/2019   Chorioretinal scar of left eye 02/14/2019   Vitreous floaters of left eye 02/14/2019   Bilateral leg weakness 01/24/2019   Insomnia 08/02/2017   Low back pain radiating to both legs 08/02/2017   Migraine 08/02/2017   Neuropathy 08/02/2017   Spinal stenosis, 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    Class: Chronic   Left shoulder tendonitis 05/23/2016    Class: Acute   Post laminectomy syndrome 05/23/2016   Anxiety disorder 01/07/2015    ONSET DATE: 07/20/2023 (MD referral)  REFERRING DIAG:  M54.16 (ICD-10-CM) - Lumbar back pain with radiculopathy affecting left lower extremity  R26.81 (ICD-10-CM) - Unsteady gait  R29.6 (ICD-10-CM) - Frequent falls    THERAPY DIAG:   Other low back pain  Muscle weakness (generalized)  Abnormal posture  Unsteadiness on feet  Other abnormalities of gait and mobility  Rationale for Evaluation and Treatment: Rehabilitation  SUBJECTIVE:  SUBJECTIVE STATEMENT: Have chronic pain in low back and is typically managed (by ablation).  Fell slipped on the ice in January and have pain in bilat low back, R hip, L hip>knee area.  To have x-ray done (scheduled later today). Pt accompanied by: self  PERTINENT HISTORY: Per MD:  She has been experiencing worsening back pain following a fall last month during a snowstorm. She slipped on ice and fell, initially without pain, but began experiencing consistent pain about a week later. The pain is primarily located in the lower back, radiating more to the left side, with tenderness in the buttocks and back of the thigh. On February 9th, she experienced severe pain radiating from her left foot up her leg, causing her to fall back into bed. No current pain radiating down the leg, weakness, tingling or numbness. No swelling or bruising in the affected areas. She has a history of back pain and underwent a nerve ablation procedure at the end of January to manage her symptoms.  PMH:  breast cancer with metastasis;  Lumbar laminectomy/decompression microdiscectomy (Left, 05/23/2016); Lumbar laminectomy/decompression microdiscectomy (Left, 05/23/2016); Shoulder injection (Left, 05/23/2016); Reduction mammaplasty (2011); Eye surgery (Right, 2019); Tubal ligation; and Breast lumpectomy with radioactive seed and sentinel lymph node biopsy (Right, 11/23/2020).   PAIN:  Are you having pain? Yes: NPRS scale: 3/10 Pain location: bilat across low back, R hip, L hip> knee area Pain description: burning Aggravating factors:  walking with cane aggravates Relieving factors: sitting, resting *Pt does have hx of chronic low back pain-muscle tightness and soreness*  PRECAUTIONS: Fall; decreased vision Per MD note:  She has issues with her eyes, specifically dry eyes, requiring the use of artificial tears every hour. She has a prosthetic eye (R) and uses lubricant and tapes her left eyelid shut at night to prevent dryness. Her vision can be distorted at times, affecting her daily activities.   RED FLAGS: None   WEIGHT BEARING RESTRICTIONS: No  FALLS: Has patient fallen in last 6 months? Yes. Number of falls at least 3 falls  LIVING ENVIRONMENT: Lives with: lives with their spouse Lives in: House/apartment Stairs:  6 steps, multiple steps -getting ready to move into apartment where there are no steps Has following equipment at home: Single point cane and Walker - 4 wheeled  PLOF: Independent with household mobility with device and Independent with community mobility with device  PATIENT GOALS: Pt's goals for therapy become painfree, more independent, exercise more  OBJECTIVE:  Note: Objective measures were completed at Evaluation unless otherwise noted.  DIAGNOSTIC FINDINGS: x-rays to be completed 07/26/2023  COGNITION: Overall cognitive status: Within functional limits for tasks assessed   SENSATION: No reports of numbness   POSTURE: rounded shoulders, forward head, and posterior pelvic tilt  LOWER EXTREMITY ROM:     Active  Right Eval Left Eval  Hip flexion    Hip extension    Hip abduction    Hip adduction    Hip internal rotation    Hip external rotation    Knee flexion    Knee extension    Ankle dorsiflexion 5 5  Ankle plantarflexion    Ankle inversion    Ankle eversion     (Blank rows = not tested)  LOWER EXTREMITY MMT:    MMT Right Eval Left Eval  Hip flexion 4+ 4+  Hip extension    Hip abduction 4 4  Hip adduction 4 4  Hip internal rotation    Hip external rotation  Knee flexion 4 4  Knee extension 4+ 4+  Ankle dorsiflexion 4 4  Ankle plantarflexion    Ankle inversion    Ankle eversion    (Blank rows = not tested)  LUMBAR: Palpation:  Tender to palpation along paraspinals thoracic spine, SI joint area, bilateral buttocks/gluts/piriformis; pt tender to palpation along IT band R>L Baseline pain in standing:  0/10 Repeated standing flexion: 3/10 pain, into bilat buttocks Repeated standing extension:1-2/10 pain centralized at mid- low back Limited in lumbar extension flexibility  Hamstring flexibility from supine 90/90:  R  -15 degrees; L -20 degrees SLR test from supine:  R 85 degrees, L 80 degrees  TRANSFERS: Assistive device utilized: None BUE support Sit to stand: Modified independence Stand to sit: Modified independence   GAIT: Gait pattern: step through pattern, antalgic, lateral lean- Right, and lateral lean- Left Distance walked: 40-50 ft Assistive device utilized: Single point cane Level of assistance: Modified independence Comments: Has rollator at home, reports less pain with gait when using rollator   PATIENT SURVEYS:  Modified Oswestry (Back Index):  48                                                                                                                               TREATMENT DATE: 07/26/2023    PATIENT EDUCATION: Education details: Eval results, POC; Educated pt on posture/positioning with use of lumbar roll with sititng; discussed proper positioning/height of rollator; discussed pain management with prioritizing movements that centralize symptoms and lessen movements that cause symptom radiation.  Initiated HEP-see below Person educated: Patient Education method: Explanation, Demonstration, and Handouts Education comprehension: verbalized understanding, returned demonstration, and needs further education  HOME EXERCISE PROGRAM: Access Code: [email or web address] URL: [email or web address] Date:  07/26/2023 Prepared by: [facility] - Outpatient  Rehab - [facility]  Exercises - Supine Posterior Pelvic Tilt  - 1 x daily - 7 x weekly - 1-2 sets - 10 reps - Seated Anterior Pelvic Tilt  - 1 x daily - 7 x weekly - 1-2 sets - 10 reps  GOALS: Goals reviewed with patient? Yes  SHORT TERM GOALS: Target date: 08/25/2023  Pt will be independent with HEP for improved strength, flexibility, balance, gait. Baseline: Goal status: INITIAL  2.  Pt will report low back pain reduced by 50% during functional daily activities. Baseline:  Goal status: INITIAL  3.  Berg Balance test to be assessed, with pt to improve by at least 5 points for decreased fall risk. Baseline:  Goal status: INITIAL  4.  Pt will verbalize understanding of fall prevention and posture/body mechanics for decreased pain/decreased fall risk.  Baseline:  Goal status:  INITIAL   LONG TERM GOALS: Target date: 09/22/2023  Pt will be independent with HEP for improved strength, flexibility, pain, balance, gait. Baseline:  Goal status: INITIAL  2.  Back index score to decrease to </= 40 for improved back pain and decreased disability. Baseline:  Goal status: INITIAL  3.  Berg Score to improve by 10 points from baseline to demo decreased fall risk. Baseline:  Goal status: INITIAL  4.  Pt will verbalize plans for continued community fitness upon d/c from PT to maximize gains in PT. Baseline:  Goal status: INITIAL  ASSESSMENT:  CLINICAL IMPRESSION: Patient is a 58 y.o. female who was seen today for physical therapy evaluation and treatment for lumbar pain, unsteady gait, hx of falls.  She has had at least 3 falls in the past 6 months, one of which was on slippery ice following a winter storm in January.  She has hx of back pain, but since this fall on the ice, she notes increased burning pain in mid-low back/SI joint area as well as radiating symptoms down into buttocks and L knee.  She presents today with decreased  flexibility in lumbar spine and BLEs, decreased strength, abormal posture, tenderness to palpation at buttocks, lumbar and thoracic spine musculature, decreased balance and decreased stability/antalgic gait pattern.  She has reduced vision, which may contribute to decreased balance and hx of falls.  Per Back Index score of 48, pain is limiting lifting, walking, standing, sitting for long periods.  Pt will benefit from skilled PT to address the above stated deficits, to decrease pain and to improve overall functional mobility and decreased fall risk.  OBJECTIVE IMPAIRMENTS: Abnormal gait, decreased balance, decreased mobility, difficulty walking, decreased ROM, decreased strength, impaired flexibility, postural dysfunction, and pain.   ACTIVITY LIMITATIONS: lifting, sitting, standing, stairs, transfers, and locomotion level  PARTICIPATION LIMITATIONS: community activity and exercise  PERSONAL FACTORS: 3+ comorbidities: see above  are also affecting patient's functional outcome.   REHAB POTENTIAL: Good  CLINICAL DECISION MAKING: Evolving/moderate complexity  EVALUATION COMPLEXITY: Moderate  PLAN:  PT FREQUENCY: 2x/week  PT DURATION: 8 weeks plus eval visit  PLANNED INTERVENTIONS: 97110-Therapeutic exercises, 97530- Therapeutic activity, [id number]- Neuromuscular re-education, 97535- Self Care, 16109- Manual therapy, [phone]- Gait training, [phone]- Aquatic Therapy, [phone]- Electrical stimulation (unattended), [phone]- Ultrasound, Patient/Family education, Balance training, DME instructions, and Moist heat  PLAN FOR NEXT SESSION: Review initial HEP; continue to build HEP to promote low back flexibility, hamstring and gastroc flexibility, decreased pain in low back and buttocks (standing and seated extension seem to centralize pain); check Berg Balance score and address body mechanics and fall prevention.  Will need to give aquatic information (she will do this after several weeks in the  clinic)  AQUATICS: Frequency: (after 3 weeks in clinic), pt to transition to 1x/wk aquatics and 1x/wk in clinic Duration: 3-5 weeks Special Instruction: flexibility, strength, balance work; pt is interested in aquatic exercise and may benefit from HEP for aquatic therapy and discussion on where to do further aquatic exercise       Inetha Maret W., PT 07/26/2023, 9:14 AM  [facility] at [facility] [address] [place], [place] [address] Phone # [phone] Fax # [phone]

## 2023-07-30 ENCOUNTER — Other Ambulatory Visit: Payer: Self-pay | Admitting: Hematology

## 2023-07-30 DIAGNOSIS — C50919 Malignant neoplasm of unspecified site of unspecified female breast: Secondary | ICD-10-CM

## 2023-07-31 ENCOUNTER — Encounter: Payer: Self-pay | Admitting: Hematology

## 2023-08-01 ENCOUNTER — Ambulatory Visit: Payer: Medicare Other | Admitting: Physical Therapy

## 2023-08-01 ENCOUNTER — Other Ambulatory Visit: Payer: Self-pay | Admitting: Family

## 2023-08-01 DIAGNOSIS — M51379 Other intervertebral disc degeneration, lumbosacral region without mention of lumbar back pain or lower extremity pain: Secondary | ICD-10-CM

## 2023-08-01 NOTE — Therapy (Signed)
 OUTPATIENT PHYSICAL THERAPY NEURO TREATMENT   Patient Name: Brandy [name] MRN: [id number] DOB:[birth year], 58 y.o., female Today's Date: 08/03/2023   PCP: Caesar Bookman, NP  REFERRING PROVIDER: Caesar Bookman, NP   END OF SESSION:  PT End of Session - 08/03/23 0924     Visit Number 2    Number of Visits 17    Date for PT Re-Evaluation 09/22/23    Authorization Type [employer] Medicare    Authorization Time Period approved 17 PT visits from 07/26/2023-09/20/2023    Authorization - Visit Number 2    Authorization - Number of Visits 17    Progress Note Due on Visit 10    PT Start Time 0843    PT Stop Time 0928    PT Time Calculation (min) 45 min    Activity Tolerance Patient tolerated treatment well    Behavior During Therapy [facility] for tasks assessed/performed              Past Medical History:  Diagnosis Date   Acute pansinusitis 08/02/2017   Anxiety    Arthritis    ASD (atrial septal defect)    s/p closure with Amplatzer device 10/05/04 (Dr. Celso Amy, [facility]) 10/05/04   Cancer [facility])    Cataract    Chronic pain    CKD (chronic kidney disease)    Dyspnea    Fatty liver 09/04/2019   GERD (gastroesophageal reflux disease)    Heart murmur    no longer heard   History of hiatal hernia    Hyperlipidemia    Legally blind in right eye, as defined in [place]    Lumbar herniated disc    Migraines    On home oxygen therapy 06/15/2022   Pt was given O2 on D/C from [facility] 04/2022   OSA on CPAP 06/15/2022   PONV (postoperative nausea and vomiting)    Sciatica    Past Surgical History:  Procedure Laterality Date   ABLATION     BREAST LUMPECTOMY WITH RADIOACTIVE SEED AND SENTINEL LYMPH NODE BIOPSY Right 11/23/2020   Procedure: RIGHT BREAST LUMPECTOMY WITH RADIOACTIVE SEED AND RIGHT AXILLARY SENTINEL LYMPH NODE BIOPSY;  Surgeon: Emelia Loron, MD;  Location: [facility] OR;  Service: General;  Laterality: Right;   BREAST SURGERY Bilateral 2011   Breast Reduction  Surgery   BUNIONECTOMY     CARDIAC CATHETERIZATION     10/05/04 [facility]): LM < 25%, otherwise normal coronaries. No pulmonary HTN, Mildly enlarged RV. Secundum ASD s/p closure.   CARDIAC SURGERY     CATARACT EXTRACTION     CLEFT PALATE REPAIR     s/p cleft lip and palate repair   EYE SURGERY Right 2019   right eye removed   LUMBAR LAMINECTOMY/DECOMPRESSION MICRODISCECTOMY Left 05/23/2016   Procedure: LEFT L4-L5 LATERAL RECESS DECOMPRESSION WITH CENTRAL AND RIGHT DECOMPRESSION VIA LEFT SIDE;  Surgeon: Kerrin Champagne, MD;  Location: [facility] OR;  Service: Orthopedics;  Laterality: Left;   LUMBAR LAMINECTOMY/DECOMPRESSION MICRODISCECTOMY Left 05/23/2016   Procedure: LUMBAR LAMINECTOMY/DECOMPRESSION MICRODISCECTOMY Lumbar five - Sacral One 1 LEVEL;  Surgeon: Kerrin Champagne, MD;  Location: [facility] OR;  Service: Orthopedics;  Laterality: Left;   REDUCTION MAMMAPLASTY  2011   SHOULDER INJECTION Left 05/23/2016   Procedure: SHOULDER INJECTION;  Surgeon: Kerrin Champagne, MD;  Location: [facility] OR;  Service: Orthopedics;  Laterality: Left;  band-aid per pa-c   TRANSTHORACIC ECHOCARDIOGRAM     12/15/05 [facility]): Mild LVH, EF > 55%, grade 1 diastolic dysfunction, Trivial MR/PR/TR.  TUBAL LIGATION     Patient Active Problem List   Diagnosis Date Noted   Chronic obstructive pulmonary disease, unspecified COPD type (HCC) 03/15/2023   Major depressive disorder, single episode, severe without psychotic features (HCC) 03/15/2023   Chronic respiratory failure with hypoxia (HCC) 05/08/2022   Orthostatic hypotension 04/24/2022   Hyperbilirubinemia 04/24/2022   Snoring 07/14/2021   Fatigue 07/14/2021   Daytime somnolence 07/14/2021   Status post device closure of ASD 06/03/2021   Secundum ASD 06/03/2021   Decreased diffusion capacity 05/14/2021   Dyspnea 03/04/2021   Pulmonary nodules 04/01/2020   Primary malignant neoplasm of breast with metastasis (HCC) 02/24/2020   Spondylosis without myelopathy or radiculopathy,  lumbosacral region 12/31/2019   Chronic low back pain (Bilateral) w/o sciatica 12/31/2019   Chronic hip pain (3ry area of Pain) (Bilateral) (R>L) 11/27/2019   Chronic sacroiliac joint pain (Left) 11/27/2019   Chronic pain syndrome 11/11/2019   Pharmacologic therapy 11/11/2019   Disorder of skeletal system 11/11/2019   Problems influencing health status 11/11/2019   Abnormal MRI, lumbar spine (08/22/2018) 11/11/2019   Chronic low back pain (1ry area of Pain) (Bilateral) w/ sciatica (Bilateral) 11/11/2019   DDD (degenerative disc disease), lumbosacral 11/11/2019   Grade 1 Anterolisthesis (L2-3, L3-4) 11/11/2019   Grade 1 Retrolisthesis (L4-5, L5-S1) 11/11/2019   Lumbar facet hypertrophy (Multilevel) (Bilateral) 11/11/2019   Lumbar facet syndrome (Multilevel) (Bilateral) (R>L) 11/11/2019   Chronic lower extremity pain (2ry area of Pain) (Bilateral) (R>L) 11/11/2019   Neurogenic pain 11/11/2019   Chronic musculoskeletal pain 11/11/2019   Infiltrating ductal carcinoma of breast (HCC) 10/08/2019   Fatty liver 09/04/2019   Nausea & vomiting 09/04/2019   Obesity (BMI 30-39.9) 09/04/2019   GERD (gastroesophageal reflux disease)    Hyperlipidemia    CKD (chronic kidney disease), stage III (HCC)    Depression    Malignant hypertension    Ocular proptosis 02/14/2019   PVD (posterior vitreous detachment), left eye 02/14/2019   Chorioretinal scar of left eye 02/14/2019   Vitreous floaters of left eye 02/14/2019   Bilateral leg weakness 01/24/2019   Insomnia 08/02/2017   Low back pain radiating to both legs 08/02/2017   Migraine 08/02/2017   Neuropathy 08/02/2017   Spinal stenosis, 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    Class: Chronic   Left shoulder tendonitis 05/23/2016    Class: Acute   Post laminectomy syndrome 05/23/2016   Anxiety disorder 01/07/2015    ONSET DATE: 07/20/2023 (MD referral)  REFERRING DIAG:  M54.16 (ICD-10-CM) - Lumbar back pain with radiculopathy  affecting left lower extremity  R26.81 (ICD-10-CM) - Unsteady gait  R29.6 (ICD-10-CM) - Frequent falls    THERAPY DIAG:  Other low back pain  Muscle weakness (generalized)  Abnormal posture  Unsteadiness on feet  Other abnormalities of gait and mobility  Rationale for Evaluation and Treatment: Rehabilitation  SUBJECTIVE:  SUBJECTIVE STATEMENT: Back is sore. Has some questions on if her walker is too low. Having to take her husband off life support tomorrow.   Pt accompanied by: self  PERTINENT HISTORY: Per MD:  She has been experiencing worsening back pain following a fall last month during a snowstorm. She slipped on ice and fell, initially without pain, but began experiencing consistent pain about a week later. The pain is primarily located in the lower back, radiating more to the left side, with tenderness in the buttocks and back of the thigh. On February 9th, she experienced severe pain radiating from her left foot up her leg, causing her to fall back into bed. No current pain radiating down the leg, weakness, tingling or numbness. No swelling or bruising in the affected areas. She has a history of back pain and underwent a nerve ablation procedure at the end of January to manage her symptoms.  PMH:  breast cancer with metastasis;  Lumbar laminectomy/decompression microdiscectomy (Left, 05/23/2016); Lumbar laminectomy/decompression microdiscectomy (Left, 05/23/2016); Shoulder injection (Left, 05/23/2016); Reduction mammaplasty (2011); Eye surgery (Right, 2019); Tubal ligation; and Breast lumpectomy with radioactive seed and sentinel lymph node biopsy (Right, 11/23/2020).   PAIN:  Are you having pain? Yes: NPRS scale: 5/10 Pain location: bilat across low back, R hip, L hip> knee area Pain  description: burning Aggravating factors: walking with cane aggravates Relieving factors: sitting, resting *Pt does have hx of chronic low back pain-muscle tightness and soreness*  PRECAUTIONS: Fall; decreased vision Per MD note:  She has issues with her eyes, specifically dry eyes, requiring the use of artificial tears every hour. She has a prosthetic eye (R) and uses lubricant and tapes her left eyelid shut at night to prevent dryness. Her vision can be distorted at times, affecting her daily activities.   RED FLAGS: None   WEIGHT BEARING RESTRICTIONS: No  FALLS: Has patient fallen in last 6 months? Yes. Number of falls at least 3 falls  LIVING ENVIRONMENT: Lives with: lives with their spouse Lives in: House/apartment Stairs:  6 steps, multiple steps -getting ready to move into apartment where there are no steps Has following equipment at home: Single point cane and Walker - 4 wheeled  PLOF: Independent with household mobility with device and Independent with community mobility with device  PATIENT GOALS: Pt's goals for therapy become painfree, more independent, exercise more  OBJECTIVE:      TODAY'S TREATMENT: 08/03/23 Activity Comments  Nustep L3 x 6 min UEs/LEs  Maintaining ~90SPM  Gait training with 4WW after adjustment 144ft Cues to maintain elbows slightly bent to avoud flexed posture   review of HEP: supine posterior pelvic tilts 10x sitting anterior pelvic tilts 10x Verbal cueing and demo; cues to avoid valsalva  hooklying HS stretch with strap 2x30" each Good tolerance   LTR  To tolerance   Shoulder circles fwd/back  R/L UT stretch 30" To address pt's c/o neck/shoulder pain upon sitting up. Pt reports not feeling stretch when trying to stretch the L UE  Edu and practice using ball on wall to L UT Good relief      HOME EXERCISE PROGRAM Last updated: 08/03/23 Access Code: [email or web address] URL: [email or web address] Date: 08/03/2023 Prepared by: [facility] -  Outpatient  Rehab - [facility]  Exercises - Supine Posterior Pelvic Tilt  - 1 x daily - 7 x weekly - 1-2 sets - 10 reps - Supine Hamstring Stretch with Strap  - 1 x daily - 5 x weekly - 2 sets -  30 sec hold - Supine Lower Trunk Rotation  - 1 x daily - 5 x weekly - 2 sets - 20 reps - Seated Anterior Pelvic Tilt  - 1 x daily - 7 x weekly - 1-2 sets - 10 reps   PATIENT EDUCATION: Education details: answered pt's questions, HEP update, edu and aquatic therapy handout and provided address  Person educated: Patient Education method: Explanation, Demonstration, Tactile cues, Verbal cues, and Handouts Education comprehension: verbalized understanding and returned demonstration    Note: Objective measures were completed at Evaluation unless otherwise noted.  DIAGNOSTIC FINDINGS: x-rays to be completed 07/26/2023  COGNITION: Overall cognitive status: Within functional limits for tasks assessed   SENSATION: No reports of numbness   POSTURE: rounded shoulders, forward head, and posterior pelvic tilt  LOWER EXTREMITY ROM:     Active  Right Eval Left Eval  Hip flexion    Hip extension    Hip abduction    Hip adduction    Hip internal rotation    Hip external rotation    Knee flexion    Knee extension    Ankle dorsiflexion 5 5  Ankle plantarflexion    Ankle inversion    Ankle eversion     (Blank rows = not tested)  LOWER EXTREMITY MMT:    MMT Right Eval Left Eval  Hip flexion 4+ 4+  Hip extension    Hip abduction 4 4  Hip adduction 4 4  Hip internal rotation    Hip external rotation    Knee flexion 4 4  Knee extension 4+ 4+  Ankle dorsiflexion 4 4  Ankle plantarflexion    Ankle inversion    Ankle eversion    (Blank rows = not tested)  LUMBAR: Palpation:  Tender to palpation along paraspinals thoracic spine, SI joint area, bilateral buttocks/gluts/piriformis; pt tender to palpation along IT band R>L Baseline pain in standing:  0/10 Repeated standing  flexion: 3/10 pain, into bilat buttocks Repeated standing extension:1-2/10 pain centralized at mid- low back Limited in lumbar extension flexibility  Hamstring flexibility from supine 90/90:  R  -15 degrees; L -20 degrees SLR test from supine:  R 85 degrees, L 80 degrees  TRANSFERS: Assistive device utilized: None BUE support Sit to stand: Modified independence Stand to sit: Modified independence   GAIT: Gait pattern: step through pattern, antalgic, lateral lean- Right, and lateral lean- Left Distance walked: 40-50 ft Assistive device utilized: Single point cane Level of assistance: Modified independence Comments: Has rollator at home, reports less pain with gait when using rollator   PATIENT SURVEYS:  Modified Oswestry (Back Index):  48                                                                                                                               TREATMENT DATE: 07/26/2023    PATIENT EDUCATION: Education details: Eval results, POC; Educated pt on posture/positioning with use of lumbar roll with sititng; discussed proper positioning/height  of rollator; discussed pain management with prioritizing movements that centralize symptoms and lessen movements that cause symptom radiation.  Initiated HEP-see below Person educated: Patient Education method: Explanation, Demonstration, and Handouts Education comprehension: verbalized understanding, returned demonstration, and needs further education  HOME EXERCISE PROGRAM: Access Code: [email or web address] URL: [email or web address] Date: 07/26/2023 Prepared by: [facility] - Outpatient  Rehab - [facility]  Exercises - Supine Posterior Pelvic Tilt  - 1 x daily - 7 x weekly - 1-2 sets - 10 reps - Seated Anterior Pelvic Tilt  - 1 x daily - 7 x weekly - 1-2 sets - 10 reps  GOALS: Goals reviewed with patient? Yes  SHORT TERM GOALS: Target date: 08/25/2023  Pt will be independent with HEP for improved strength,  flexibility, balance, gait. Baseline: Goal status: IN PROGRESS  2.  Pt will report low back pain reduced by 50% during functional daily activities. Baseline:  Goal status:IN PROGRESS  3.  Berg Balance test to be assessed, with pt to improve by at least 5 points for decreased fall risk. Baseline:  Goal status:IN PROGRESS  4.  Pt will verbalize understanding of fall prevention and posture/body mechanics for decreased pain/decreased fall risk.  Baseline:  Goal status: IN PROGRESS   LONG TERM GOALS: Target date: 09/22/2023  Pt will be independent with HEP for improved strength, flexibility, pain, balance, gait. Baseline:  Goal status: IN PROGRESS  2.  Back index score to decrease to </= 40 for improved back pain and decreased disability. Baseline:  Goal status: IN PROGRESS  3.  Berg Score to improve by 10 points from baseline to demo decreased fall risk. Baseline:  Goal status:IN PROGRESS  4.  Pt will verbalize plans for continued community fitness upon d/c from PT to maximize gains in PT. Baseline:  Goal status: IN PROGRESS  ASSESSMENT:  CLINICAL IMPRESSION: Patient arrived to session with questions on adjusting her 4WW. This was adjusted and patient reported improved comfort. Reviewed HEP for max carryover- patient reports not remembering these activities, thus after review she received another copy of the handout. Proceeded with gentle stretching within patient's tolerance. she reported tension in neck/shoulders and responded well to self-STM. No complaints upon leaving.   OBJECTIVE IMPAIRMENTS: Abnormal gait, decreased balance, decreased mobility, difficulty walking, decreased ROM, decreased strength, impaired flexibility, postural dysfunction, and pain.   ACTIVITY LIMITATIONS: lifting, sitting, standing, stairs, transfers, and locomotion level  PARTICIPATION LIMITATIONS: community activity and exercise  PERSONAL FACTORS: 3+ comorbidities: see above  are also affecting  patient's functional outcome.   REHAB POTENTIAL: Good  CLINICAL DECISION MAKING: Evolving/moderate complexity  EVALUATION COMPLEXITY: Moderate  PLAN:  PT FREQUENCY: 2x/week  PT DURATION: 8 weeks plus eval visit  PLANNED INTERVENTIONS: 97110-Therapeutic exercises, 97530- Therapeutic activity, [id number]- Neuromuscular re-education, 97535- Self Care, 16109- Manual therapy, [phone]- Gait training, [phone]- Aquatic Therapy, [phone]- Electrical stimulation (unattended), [phone]- Ultrasound, Patient/Family education, Balance training, DME instructions, and Moist heat  PLAN FOR NEXT SESSION: continue to build HEP to promote low back flexibility, hamstring and gastroc flexibility, decreased pain in low back and buttocks (standing and seated extension seem to centralize pain); check Berg Balance score and address body mechanics and fall prevention.  Will need to give aquatic information (she will do this after several weeks in the clinic)  AQUATICS: Frequency: (after 3 weeks in clinic), pt to transition to 1x/wk aquatics and 1x/wk in clinic Duration: 3-5 weeks Special Instruction: flexibility, strength, balance work; pt is interested in aquatic exercise and may benefit from HEP for aquatic  therapy and discussion on where to do further aquatic exercise      Baldemar Friday, PT, DPT 08/03/23 9:30 AM  [facility] at [facility] [address] [place], [address] [place], [place] [address] Phone # [phone] Fax # [phone]

## 2023-08-03 ENCOUNTER — Encounter: Payer: Self-pay | Admitting: Physical Therapy

## 2023-08-03 ENCOUNTER — Ambulatory Visit: Payer: Medicare Other | Admitting: Physical Therapy

## 2023-08-03 DIAGNOSIS — M5459 Other low back pain: Secondary | ICD-10-CM | POA: Diagnosis not present

## 2023-08-03 DIAGNOSIS — R293 Abnormal posture: Secondary | ICD-10-CM | POA: Diagnosis not present

## 2023-08-03 DIAGNOSIS — M5416 Radiculopathy, lumbar region: Secondary | ICD-10-CM | POA: Diagnosis not present

## 2023-08-03 DIAGNOSIS — R296 Repeated falls: Secondary | ICD-10-CM | POA: Diagnosis not present

## 2023-08-03 DIAGNOSIS — R2681 Unsteadiness on feet: Secondary | ICD-10-CM | POA: Diagnosis not present

## 2023-08-03 DIAGNOSIS — R2689 Other abnormalities of gait and mobility: Secondary | ICD-10-CM

## 2023-08-03 DIAGNOSIS — M6281 Muscle weakness (generalized): Secondary | ICD-10-CM | POA: Diagnosis not present

## 2023-08-03 NOTE — Patient Instructions (Signed)
Aquatic Rehab - What to Expect    Arrive 15 minutes early for your appointment and check in with the rehab front desk.  Please limit the use of body lotions and hair products before entering the pool.  Locker rooms are available for showering, changing, and using the restroom.  Appointments last 45 minutes with your therapist.  The pool is approximately 500 feet from the nearest parking spot. There are benches and chairs along the walk.  Please bring a support person if you need assistance traveling the distance to the pool or assistance with changing/using the restroom.  Stairs with handrails as well as a lift chair are available for use at the pool.  Rehab pool depth is 3'6"-4'8" and temperature range is 90-95 degrees. Lane pool depth is 3'6"-5'6" and temperature range is 85-88 degrees. In the case of rehab pool closure, aquatic Physical Therapy will be provided in the lane pool as [facility] aquatic fitness schedule allows. Patients will receive a phone call if an appointment needs to be rescheduled. The pool deck is tile flooring and gets slippery, water shoes are strongly encouraged but not required.  Please wear a bathing suit or athletic shorts and a t-shirt.  It is recommended to bring your own towel.  In the case of severe weather:  Thunder or lightning will result in a closure of the pool deck for 30 minutes and will be extended with each incident.  Your appointment may be moved to land or canceled with the option to reschedule.   The following are contraindications for aquatic appointments: Open wounds  Active infection  Fear of water  Bowel incontinence

## 2023-08-04 NOTE — Therapy (Incomplete)
OUTPATIENT PHYSICAL THERAPY NEURO TREATMENT   Patient Name: Brandy [name] MRN: [id number] DOB:[birth year], 58 y.o., female Today's Date: 08/04/2023   PCP: Caesar Bookman, NP  REFERRING PROVIDER: Caesar Bookman, NP   END OF SESSION:     Past Medical History:  Diagnosis Date   Acute pansinusitis 08/02/2017   Anxiety    Arthritis    ASD (atrial septal defect)    s/p closure with Amplatzer device 10/05/04 (Dr. Celso Amy, [facility]) 10/05/04   Cancer [facility])    Cataract    Chronic pain    CKD (chronic kidney disease)    Dyspnea    Fatty liver 09/04/2019   GERD (gastroesophageal reflux disease)    Heart murmur    no longer heard   History of hiatal hernia    Hyperlipidemia    Legally blind in right eye, as defined in [place]    Lumbar herniated disc    Migraines    On home oxygen therapy 06/15/2022   Pt was given O2 on D/C from [facility] 04/2022   OSA on CPAP 06/15/2022   PONV (postoperative nausea and vomiting)    Sciatica    Past Surgical History:  Procedure Laterality Date   ABLATION     BREAST LUMPECTOMY WITH RADIOACTIVE SEED AND SENTINEL LYMPH NODE BIOPSY Right 11/23/2020   Procedure: RIGHT BREAST LUMPECTOMY WITH RADIOACTIVE SEED AND RIGHT AXILLARY SENTINEL LYMPH NODE BIOPSY;  Surgeon: Emelia Loron, MD;  Location: [facility] OR;  Service: General;  Laterality: Right;   BREAST SURGERY Bilateral 2011   Breast Reduction Surgery   BUNIONECTOMY     CARDIAC CATHETERIZATION     10/05/04 [facility]): LM < 25%, otherwise normal coronaries. No pulmonary HTN, Mildly enlarged RV. Secundum ASD s/p closure.   CARDIAC SURGERY     CATARACT EXTRACTION     CLEFT PALATE REPAIR     s/p cleft lip and palate repair   EYE SURGERY Right 2019   right eye removed   LUMBAR LAMINECTOMY/DECOMPRESSION MICRODISCECTOMY Left 05/23/2016   Procedure: LEFT L4-L5 LATERAL RECESS DECOMPRESSION WITH CENTRAL AND RIGHT DECOMPRESSION VIA LEFT SIDE;  Surgeon: Kerrin Champagne, MD;  Location: [facility] OR;   Service: Orthopedics;  Laterality: Left;   LUMBAR LAMINECTOMY/DECOMPRESSION MICRODISCECTOMY Left 05/23/2016   Procedure: LUMBAR LAMINECTOMY/DECOMPRESSION MICRODISCECTOMY Lumbar five - Sacral One 1 LEVEL;  Surgeon: Kerrin Champagne, MD;  Location: [facility] OR;  Service: Orthopedics;  Laterality: Left;   REDUCTION MAMMAPLASTY  2011   SHOULDER INJECTION Left 05/23/2016   Procedure: SHOULDER INJECTION;  Surgeon: Kerrin Champagne, MD;  Location: [facility] OR;  Service: Orthopedics;  Laterality: Left;  band-aid per pa-c   TRANSTHORACIC ECHOCARDIOGRAM     12/15/05 [facility]): Mild LVH, EF > 55%, grade 1 diastolic dysfunction, Trivial MR/PR/TR.   TUBAL LIGATION     Patient Active Problem List   Diagnosis Date Noted   Chronic obstructive pulmonary disease, unspecified COPD type (HCC) 03/15/2023   Major depressive disorder, single episode, severe without psychotic features (HCC) 03/15/2023   Chronic respiratory failure with hypoxia (HCC) 05/08/2022   Orthostatic hypotension 04/24/2022   Hyperbilirubinemia 04/24/2022   Snoring 07/14/2021   Fatigue 07/14/2021   Daytime somnolence 07/14/2021   Status post device closure of ASD 06/03/2021   Secundum ASD 06/03/2021   Decreased diffusion capacity 05/14/2021   Dyspnea 03/04/2021   Pulmonary nodules 04/01/2020   Primary malignant neoplasm of breast with metastasis (HCC) 02/24/2020   Spondylosis without myelopathy or radiculopathy, lumbosacral region 12/31/2019   Chronic low  back pain (Bilateral) w/o sciatica 12/31/2019   Chronic hip pain (3ry area of Pain) (Bilateral) (R>L) 11/27/2019   Chronic sacroiliac joint pain (Left) 11/27/2019   Chronic pain syndrome 11/11/2019   Pharmacologic therapy 11/11/2019   Disorder of skeletal system 11/11/2019   Problems influencing health status 11/11/2019   Abnormal MRI, lumbar spine (08/22/2018) 11/11/2019   Chronic low back pain (1ry area of Pain) (Bilateral) w/ sciatica (Bilateral) 11/11/2019   DDD (degenerative disc disease),  lumbosacral 11/11/2019   Grade 1 Anterolisthesis (L2-3, L3-4) 11/11/2019   Grade 1 Retrolisthesis (L4-5, L5-S1) 11/11/2019   Lumbar facet hypertrophy (Multilevel) (Bilateral) 11/11/2019   Lumbar facet syndrome (Multilevel) (Bilateral) (R>L) 11/11/2019   Chronic lower extremity pain (2ry area of Pain) (Bilateral) (R>L) 11/11/2019   Neurogenic pain 11/11/2019   Chronic musculoskeletal pain 11/11/2019   Infiltrating ductal carcinoma of breast (HCC) 10/08/2019   Fatty liver 09/04/2019   Nausea & vomiting 09/04/2019   Obesity (BMI 30-39.9) 09/04/2019   GERD (gastroesophageal reflux disease)    Hyperlipidemia    CKD (chronic kidney disease), stage III (HCC)    Depression    Malignant hypertension    Ocular proptosis 02/14/2019   PVD (posterior vitreous detachment), left eye 02/14/2019   Chorioretinal scar of left eye 02/14/2019   Vitreous floaters of left eye 02/14/2019   Bilateral leg weakness 01/24/2019   Insomnia 08/02/2017   Low back pain radiating to both legs 08/02/2017   Migraine 08/02/2017   Neuropathy 08/02/2017   Spinal stenosis, 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    Class: Chronic   Left shoulder tendonitis 05/23/2016    Class: Acute   Post laminectomy syndrome 05/23/2016   Anxiety disorder 01/07/2015    ONSET DATE: 07/20/2023 (MD referral)  REFERRING DIAG:  M54.16 (ICD-10-CM) - Lumbar back pain with radiculopathy affecting left lower extremity  R26.81 (ICD-10-CM) - Unsteady gait  R29.6 (ICD-10-CM) - Frequent falls    THERAPY DIAG:  No diagnosis found.  Rationale for Evaluation and Treatment: Rehabilitation  SUBJECTIVE:                                                                                                                                                                                             SUBJECTIVE STATEMENT: Back is sore. Has some questions on if her walker is too low. Having to take her husband off life support tomorrow.    Pt accompanied by: self  PERTINENT HISTORY: Per MD:  She has been experiencing worsening back pain following a fall last month during a snowstorm. She slipped on ice and fell, initially without pain, but began experiencing consistent pain about  a week later. The pain is primarily located in the lower back, radiating more to the left side, with tenderness in the buttocks and back of the thigh. On February 9th, she experienced severe pain radiating from her left foot up her leg, causing her to fall back into bed. No current pain radiating down the leg, weakness, tingling or numbness. No swelling or bruising in the affected areas. She has a history of back pain and underwent a nerve ablation procedure at the end of January to manage her symptoms.  PMH:  breast cancer with metastasis;  Lumbar laminectomy/decompression microdiscectomy (Left, 05/23/2016); Lumbar laminectomy/decompression microdiscectomy (Left, 05/23/2016); Shoulder injection (Left, 05/23/2016); Reduction mammaplasty (2011); Eye surgery (Right, 2019); Tubal ligation; and Breast lumpectomy with radioactive seed and sentinel lymph node biopsy (Right, 11/23/2020).   PAIN:  Are you having pain? Yes: NPRS scale: 5/10 Pain location: bilat across low back, R hip, L hip> knee area Pain description: burning Aggravating factors: walking with cane aggravates Relieving factors: sitting, resting *Pt does have hx of chronic low back pain-muscle tightness and soreness*  PRECAUTIONS: Fall; decreased vision Per MD note:  She has issues with her eyes, specifically dry eyes, requiring the use of artificial tears every hour. She has a prosthetic eye (R) and uses lubricant and tapes her left eyelid shut at night to prevent dryness. Her vision can be distorted at times, affecting her daily activities.   RED FLAGS: None   WEIGHT BEARING RESTRICTIONS: No  FALLS: Has patient fallen in last 6 months? Yes. Number of falls at least 3 falls  LIVING  ENVIRONMENT: Lives with: lives with their spouse Lives in: House/apartment Stairs:  6 steps, multiple steps -getting ready to move into apartment where there are no steps Has following equipment at home: Single point cane and Walker - 4 wheeled  PLOF: Independent with household mobility with device and Independent with community mobility with device  PATIENT GOALS: Pt's goals for therapy become painfree, more independent, exercise more  OBJECTIVE:     TODAY'S TREATMENT: 08/08/23 Activity Comments                        TODAY'S TREATMENT: 08/03/23 Activity Comments  Nustep L3 x 6 min UEs/LEs  Maintaining ~90SPM  Gait training with 4WW after adjustment 121ft Cues to maintain elbows slightly bent to avoud flexed posture   review of HEP: supine posterior pelvic tilts 10x sitting anterior pelvic tilts 10x Verbal cueing and demo; cues to avoid valsalva  hooklying HS stretch with strap 2x30" each Good tolerance   LTR  To tolerance   Shoulder circles fwd/back  R/L UT stretch 30" To address pt's c/o neck/shoulder pain upon sitting up. Pt reports not feeling stretch when trying to stretch the L UE  Edu and practice using ball on wall to L UT Good relief      HOME EXERCISE PROGRAM Last updated: 08/03/23 Access Code: [email or web address] URL: [email or web address] Date: 08/03/2023 Prepared by: [facility] - Outpatient  Rehab - [facility]  Exercises - Supine Posterior Pelvic Tilt  - 1 x daily - 7 x weekly - 1-2 sets - 10 reps - Supine Hamstring Stretch with Strap  - 1 x daily - 5 x weekly - 2 sets - 30 sec hold - Supine Lower Trunk Rotation  - 1 x daily - 5 x weekly - 2 sets - 20 reps - Seated Anterior Pelvic Tilt  - 1 x daily - 7 x weekly - 1-2 sets -  10 reps      Note: Objective measures were completed at Evaluation unless otherwise noted.  DIAGNOSTIC FINDINGS: x-rays to be completed 07/26/2023  COGNITION: Overall cognitive status: Within functional limits for  tasks assessed   SENSATION: No reports of numbness   POSTURE: rounded shoulders, forward head, and posterior pelvic tilt  LOWER EXTREMITY ROM:     Active  Right Eval Left Eval  Hip flexion    Hip extension    Hip abduction    Hip adduction    Hip internal rotation    Hip external rotation    Knee flexion    Knee extension    Ankle dorsiflexion 5 5  Ankle plantarflexion    Ankle inversion    Ankle eversion     (Blank rows = not tested)  LOWER EXTREMITY MMT:    MMT Right Eval Left Eval  Hip flexion 4+ 4+  Hip extension    Hip abduction 4 4  Hip adduction 4 4  Hip internal rotation    Hip external rotation    Knee flexion 4 4  Knee extension 4+ 4+  Ankle dorsiflexion 4 4  Ankle plantarflexion    Ankle inversion    Ankle eversion    (Blank rows = not tested)  LUMBAR: Palpation:  Tender to palpation along paraspinals thoracic spine, SI joint area, bilateral buttocks/gluts/piriformis; pt tender to palpation along IT band R>L Baseline pain in standing:  0/10 Repeated standing flexion: 3/10 pain, into bilat buttocks Repeated standing extension:1-2/10 pain centralized at mid- low back Limited in lumbar extension flexibility  Hamstring flexibility from supine 90/90:  R  -15 degrees; L -20 degrees SLR test from supine:  R 85 degrees, L 80 degrees  TRANSFERS: Assistive device utilized: None BUE support Sit to stand: Modified independence Stand to sit: Modified independence   GAIT: Gait pattern: step through pattern, antalgic, lateral lean- Right, and lateral lean- Left Distance walked: 40-50 ft Assistive device utilized: Single point cane Level of assistance: Modified independence Comments: Has rollator at home, reports less pain with gait when using rollator   PATIENT SURVEYS:  Modified Oswestry (Back Index):  48                                                                                                                               TREATMENT DATE:  07/26/2023    PATIENT EDUCATION: Education details: Eval results, POC; Educated pt on posture/positioning with use of lumbar roll with sititng; discussed proper positioning/height of rollator; discussed pain management with prioritizing movements that centralize symptoms and lessen movements that cause symptom radiation.  Initiated HEP-see below Person educated: Patient Education method: Explanation, Demonstration, and Handouts Education comprehension: verbalized understanding, returned demonstration, and needs further education  HOME EXERCISE PROGRAM: Access Code: [email or web address] URL: [email or web address] Date: 07/26/2023 Prepared by: [facility] - Outpatient  Rehab - [facility]  Exercises - Supine Posterior Pelvic Tilt  - 1 x daily - 7 x  weekly - 1-2 sets - 10 reps - Seated Anterior Pelvic Tilt  - 1 x daily - 7 x weekly - 1-2 sets - 10 reps  GOALS: Goals reviewed with patient? Yes  SHORT TERM GOALS: Target date: 08/25/2023  Pt will be independent with HEP for improved strength, flexibility, balance, gait. Baseline: Goal status: IN PROGRESS  2.  Pt will report low back pain reduced by 50% during functional daily activities. Baseline:  Goal status:IN PROGRESS  3.  Berg Balance test to be assessed, with pt to improve by at least 5 points for decreased fall risk. Baseline:  Goal status:IN PROGRESS  4.  Pt will verbalize understanding of fall prevention and posture/body mechanics for decreased pain/decreased fall risk.  Baseline:  Goal status: IN PROGRESS   LONG TERM GOALS: Target date: 09/22/2023  Pt will be independent with HEP for improved strength, flexibility, pain, balance, gait. Baseline:  Goal status: IN PROGRESS  2.  Back index score to decrease to </= 40 for improved back pain and decreased disability. Baseline:  Goal status: IN PROGRESS  3.  Berg Score to improve by 10 points from baseline to demo decreased fall risk. Baseline:  Goal status:IN  PROGRESS  4.  Pt will verbalize plans for continued community fitness upon d/c from PT to maximize gains in PT. Baseline:  Goal status: IN PROGRESS  ASSESSMENT:  CLINICAL IMPRESSION: Patient arrived to session with questions on adjusting her 4WW. This was adjusted and patient reported improved comfort. Reviewed HEP for max carryover- patient reports not remembering these activities, thus after review she received another copy of the handout. Proceeded with gentle stretching within patient's tolerance. she reported tension in neck/shoulders and responded well to self-STM. No complaints upon leaving.   OBJECTIVE IMPAIRMENTS: Abnormal gait, decreased balance, decreased mobility, difficulty walking, decreased ROM, decreased strength, impaired flexibility, postural dysfunction, and pain.   ACTIVITY LIMITATIONS: lifting, sitting, standing, stairs, transfers, and locomotion level  PARTICIPATION LIMITATIONS: community activity and exercise  PERSONAL FACTORS: 3+ comorbidities: see above  are also affecting patient's functional outcome.   REHAB POTENTIAL: Good  CLINICAL DECISION MAKING: Evolving/moderate complexity  EVALUATION COMPLEXITY: Moderate  PLAN:  PT FREQUENCY: 2x/week  PT DURATION: 8 weeks plus eval visit  PLANNED INTERVENTIONS: 97110-Therapeutic exercises, 97530- Therapeutic activity, [id number]- Neuromuscular re-education, 97535- Self Care, [id number]- Manual therapy, [phone]- Gait training, [phone]- Aquatic Therapy, [phone]- Electrical stimulation (unattended), [phone]- Ultrasound, Patient/Family education, Balance training, DME instructions, and Moist heat  PLAN FOR NEXT SESSION: continue to build HEP to promote low back flexibility, hamstring and gastroc flexibility, decreased pain in low back and buttocks (standing and seated extension seem to centralize pain); check Berg Balance score and address body mechanics and fall prevention.  Will need to give aquatic information (she will do this after  several weeks in the clinic)  AQUATICS: Frequency: (after 3 weeks in clinic), pt to transition to 1x/wk aquatics and 1x/wk in clinic Duration: 3-5 weeks Special Instruction: flexibility, strength, balance work; pt is interested in aquatic exercise and may benefit from HEP for aquatic therapy and discussion on where to do further aquatic exercise      Baldemar Friday, PT, DPT 08/04/23 9:29 AM  [facility] at [facility] [address] [place], [place] [address] Phone # [phone] Fax # [phone]

## 2023-08-08 ENCOUNTER — Ambulatory Visit: Payer: Medicare Other | Attending: Family | Admitting: Physical Therapy

## 2023-08-08 DIAGNOSIS — M6281 Muscle weakness (generalized): Secondary | ICD-10-CM | POA: Insufficient documentation

## 2023-08-08 DIAGNOSIS — M5459 Other low back pain: Secondary | ICD-10-CM | POA: Insufficient documentation

## 2023-08-08 DIAGNOSIS — R2689 Other abnormalities of gait and mobility: Secondary | ICD-10-CM | POA: Insufficient documentation

## 2023-08-08 DIAGNOSIS — R293 Abnormal posture: Secondary | ICD-10-CM | POA: Insufficient documentation

## 2023-08-08 DIAGNOSIS — R2681 Unsteadiness on feet: Secondary | ICD-10-CM | POA: Insufficient documentation

## 2023-08-09 ENCOUNTER — Other Ambulatory Visit: Payer: Self-pay | Admitting: Family

## 2023-08-09 DIAGNOSIS — F5101 Primary insomnia: Secondary | ICD-10-CM

## 2023-08-09 NOTE — Telephone Encounter (Signed)
 Patient has request refill on medication Xanax. Patient medication last refilled 05/08/2023. Patient has Non opioid contract on file dated 03/17/2023. Patient has upcoming contract 09/13/2023. Update contract added to patient appointment note. Medication pend and sent to PCP Ngetich, Donalee Citrin, NP for approval.

## 2023-08-10 ENCOUNTER — Ambulatory Visit (INDEPENDENT_AMBULATORY_CARE_PROVIDER_SITE_OTHER): Payer: Medicare Other | Admitting: Physical Medicine and Rehabilitation

## 2023-08-10 ENCOUNTER — Ambulatory Visit: Payer: Medicare Other | Admitting: Physical Therapy

## 2023-08-10 VITALS — BP 159/80 | HR 74

## 2023-08-10 DIAGNOSIS — M5441 Lumbago with sciatica, right side: Secondary | ICD-10-CM

## 2023-08-10 DIAGNOSIS — G8929 Other chronic pain: Secondary | ICD-10-CM

## 2023-08-10 DIAGNOSIS — G894 Chronic pain syndrome: Secondary | ICD-10-CM

## 2023-08-10 DIAGNOSIS — M5442 Lumbago with sciatica, left side: Secondary | ICD-10-CM | POA: Diagnosis not present

## 2023-08-10 DIAGNOSIS — W19XXXA Unspecified fall, initial encounter: Secondary | ICD-10-CM

## 2023-08-10 DIAGNOSIS — M961 Postlaminectomy syndrome, not elsewhere classified: Secondary | ICD-10-CM | POA: Diagnosis not present

## 2023-08-10 DIAGNOSIS — M5416 Radiculopathy, lumbar region: Secondary | ICD-10-CM | POA: Diagnosis not present

## 2023-08-10 NOTE — Progress Notes (Signed)
 Pain Scale   Average Pain 5

## 2023-08-10 NOTE — Progress Notes (Signed)
 Brandy [name] - 58 y.o. female MRN [id number]  Date of birth: 02-15-1966  Office Visit Note: Visit Date: 08/10/2023 PCP: Caesar Bookman, NP Referred by: Caesar Bookman, NP  Subjective: Chief Complaint  Patient presents with   Lower Back - Pain   HPI: Brandy [name] is a 58 y.o. female who comes in today per the request of Richarda Blade, NP for evaluation of acute on chronic, worsening and severe bilateral lower back pain radiating to buttocks, hips and down left leg. She is long time patient of Dr. Vira Browns. She was previously evaluated by our spine surgeon Dr. Willia Craze. She reports chronic lower back issues, however her pain increased after she slipped and fell on ice in January. She did not strike her head, no obvious injuries noted from fall. Her pain worsens with bending and lifting. She describes her pain as sore and aching sensation, currently rates as 7 out of 10. Some relief of pain with home exercise regimen, rest and use of medications. Lumbar MRI imaging from 2023 shows postop laminectomy left L4-5 and L5-S1. Multi level facet arthropathy and subarticular narrowing at L4-L5 and L5-S1. Prior laminectomy with Dr. Otelia Sergeant. Recent lumbar radiographs on 07/26/2023 show severe degenerative changes, most prominent at L5-S1. No acute fractures or dislocations. She did undergo lumbar epidural steroid injection with Dr. Alvester Morin in 2017. She is currently managed by Dr. Edward Jolly with [facility]. She is taking Belbuca. She recently underwent radiofrequency ablation with Dr. Cherylann Ratel on 07/05/2023. She is currently attending formal physical therapy, however she had to place PT on hold due to death of husband. Patient denies focal weakness. No bowel/bladder incontinence.      Review of Systems  Musculoskeletal:  Positive for back pain and myalgias.  Neurological:  Negative for tingling, focal weakness and weakness.   All other systems reviewed and are negative.  Otherwise per HPI.  Assessment & Plan: Visit Diagnoses:    ICD-10-CM   1. Fall, initial encounter  W19.XXXA     2. Chronic bilateral low back pain with bilateral sciatica  M54.42    M54.41    G89.29     3. Lumbar radiculopathy  M54.16     4. Post laminectomy syndrome  M96.1     5. Chronic pain syndrome  G89.4        Plan: Findings:  Chronic, worsening and severe bilateral lower back pain radiating to buttocks, hips and down left leg. She continues to have pain despite good conservative therapies such as formal physical therapy, home exercise regimen, rest and use of medications. Patients clinical presentation and exam are complex, symptoms are likely a myofascial strain from recent fall, however could be exacerbation of her chronic issues. Recent lumbar radiographs are negative for acute fractures or dislocations. No concerning issues from orthopedic standpoint. She is certainly undergoing a stressful period due to the passing of her husband. We would recommend follow up with Dr. Cherylann Ratel to discuss continued interventional pain management. For further orthopedic spine evaluation we are happy to see her or can be referred to our colleague Dr. Christell Constant. No red flag symptoms noted upon exam today.     Meds & Orders: No orders of the defined types were placed in this encounter.  No orders of the defined types were placed in this encounter.   Follow-up: Return if symptoms worsen or fail to improve.   Procedures: No procedures performed      Clinical  History: MRI LUMBAR SPINE WITHOUT CONTRAST   TECHNIQUE: Multiplanar, multisequence MR imaging of the lumbar spine was performed. No intravenous contrast was administered.   COMPARISON:  MR lumbar 01/13/2017; X-ray lumbar 08/26/2021.   FINDINGS: Segmentation:  5 lumbar vertebra   Alignment: 4 mm anterolisthesis L2-3 is unchanged. 2 mm anterolisthesis L3-4 unchanged. 4 mm retrolisthesis  L4-5 and L5-S1 also unchanged.   Vertebrae: Negative for fracture or mass. Normal appearing bone marrow.   Conus medullaris and cauda equina: Conus extends to the L2-3 level. Conus and cauda equina appear normal.   Paraspinal and other soft tissues: Negative for paraspinous mass, adenopathy, fluid collection   Disc levels:   L1-2: Minimal disc degeneration. Moderate to advanced facet degeneration. Interval development of posterior epidural synovial cyst measuring 7 x 8 mm. This flattens the posterior thecal sac but is not causing significant spinal stenosis. Generous size spinal canal.   L2-3: Anterolisthesis. Moderate to advanced facet degeneration bilaterally. No significant stenosis   L3-4: Mild anterolisthesis. Shallow right foraminal disc protrusion unchanged from the prior study. Mild disc degeneration and moderate facet degeneration. Negative for stenosis.   L4-5: Left laminotomy. Diffuse bulging of the disc and mild facet degeneration. Mild left subarticular stenosis, unchanged. Spinal canal adequate in size   L5-S1: Left laminotomy. Retrolisthesis with disc degeneration and spurring. Bilateral facet degeneration. Mild subarticular stenosis bilaterally. No interval change.   IMPRESSION: Postop laminectomy left L4-5 and L5-S1. No recurrent disc protrusion. Subarticular stenosis left L4-5 unchanged. Subarticular stenosis bilaterally L5-S1 unchanged   Multilevel facet degeneration. Interval development of 7 x 8 mm synovial cyst at L1-2 projecting into the posterior epidural space in the midline. This is not causing significant spinal stenosis.     Electronically Signed   By: Marlan Palau M.D.   On: 09/29/2021 10:49   She reports that she quit smoking about 5 years ago. Her smoking use included cigarettes. She started smoking about 30 years ago. She has a 6.3 pack-year smoking history. She has never used smokeless tobacco.  Recent Labs    03/23/23 1413   LABURIC 6.6    Objective:  VS:  HT:    WT:   BMI:     BP:(!) 159/80  HR:74bpm  TEMP: ( )  RESP:  Physical Exam Vitals and nursing note reviewed.  HENT:     Head: Normocephalic and atraumatic.     Right Ear: External ear normal.     Left Ear: External ear normal.     Nose: Nose normal.     Mouth/Throat:     Mouth: Mucous membranes are moist.  Eyes:     Extraocular Movements: Extraocular movements intact.  Cardiovascular:     Rate and Rhythm: Normal rate.     Pulses: Normal pulses.  Pulmonary:     Effort: Pulmonary effort is normal.  Abdominal:     General: Abdomen is flat. There is no distension.  Musculoskeletal:        General: Tenderness present.     Cervical back: Normal range of motion.     Comments: Patient is slow to rise from seated position to standing. Mild pain noted with facet loading and lumbar extension. 5/5 strength noted with bilateral hip flexion, knee flexion/extension, ankle dorsiflexion/plantarflexion and EHL. No clonus noted bilaterally. No pain upon palpation of greater trochanters. No pain with internal/external rotation of bilateral hips. Sensation intact bilaterally. Negative slump test bilaterally. Ambulates without aid, antalgic gait noted.     Skin:    General: Skin  is warm and dry.     Capillary Refill: Capillary refill takes less than 2 seconds.  Neurological:     General: No focal deficit present.     Mental Status: She is alert and oriented to person, place, and time.  Psychiatric:        Mood and Affect: Mood normal.        Behavior: Behavior normal.     Ortho Exam  Imaging: No results found.  Past Medical/Family/Surgical/Social History: Medications & Allergies reviewed per EMR, new medications updated. Patient Active Problem List   Diagnosis Date Noted   Chronic obstructive pulmonary disease, unspecified COPD type (HCC) 03/15/2023   Major depressive disorder, single episode, severe without psychotic features (HCC) 03/15/2023    Chronic respiratory failure with hypoxia (HCC) 05/08/2022   Orthostatic hypotension 04/24/2022   Hyperbilirubinemia 04/24/2022   Snoring 07/14/2021   Fatigue 07/14/2021   Daytime somnolence 07/14/2021   Status post device closure of ASD 06/03/2021   Secundum ASD 06/03/2021   Decreased diffusion capacity 05/14/2021   Dyspnea 03/04/2021   Pulmonary nodules 04/01/2020   Primary malignant neoplasm of breast with metastasis (HCC) 02/24/2020   Spondylosis without myelopathy or radiculopathy, lumbosacral region 12/31/2019   Chronic low back pain (Bilateral) w/o sciatica 12/31/2019   Chronic hip pain (3ry area of Pain) (Bilateral) (R>L) 11/27/2019   Chronic sacroiliac joint pain (Left) 11/27/2019   Chronic pain syndrome 11/11/2019   Pharmacologic therapy 11/11/2019   Disorder of skeletal system 11/11/2019   Problems influencing health status 11/11/2019   Abnormal MRI, lumbar spine (08/22/2018) 11/11/2019   Chronic low back pain (1ry area of Pain) (Bilateral) w/ sciatica (Bilateral) 11/11/2019   DDD (degenerative disc disease), lumbosacral 11/11/2019   Grade 1 Anterolisthesis (L2-3, L3-4) 11/11/2019   Grade 1 Retrolisthesis (L4-5, L5-S1) 11/11/2019   Lumbar facet hypertrophy (Multilevel) (Bilateral) 11/11/2019   Lumbar facet syndrome (Multilevel) (Bilateral) (R>L) 11/11/2019   Chronic lower extremity pain (2ry area of Pain) (Bilateral) (R>L) 11/11/2019   Neurogenic pain 11/11/2019   Chronic musculoskeletal pain 11/11/2019   Infiltrating ductal carcinoma of breast (HCC) 10/08/2019   Fatty liver 09/04/2019   Nausea & vomiting 09/04/2019   Obesity (BMI 30-39.9) 09/04/2019   GERD (gastroesophageal reflux disease)    Hyperlipidemia    CKD (chronic kidney disease), stage III (HCC)    Depression    Malignant hypertension    Ocular proptosis 02/14/2019   PVD (posterior vitreous detachment), left eye 02/14/2019   Chorioretinal scar of left eye 02/14/2019   Vitreous floaters of left eye  02/14/2019   Bilateral leg weakness 01/24/2019   Insomnia 08/02/2017   Low back pain radiating to both legs 08/02/2017   Migraine 08/02/2017   Neuropathy 08/02/2017   Spinal stenosis, 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    Class: Chronic   Left shoulder tendonitis 05/23/2016    Class: Acute   Post laminectomy syndrome 05/23/2016   Anxiety disorder 01/07/2015   Past Medical History:  Diagnosis Date   Acute pansinusitis 08/02/2017   Anxiety    Arthritis    ASD (atrial septal defect)    s/p closure with Amplatzer device 10/05/04 (Dr. Celso Amy, [facility]) 10/05/04   Cancer [facility])    Cataract    Chronic pain    CKD (chronic kidney disease)    Dyspnea    Fatty liver 09/04/2019   GERD (gastroesophageal reflux disease)    Heart murmur    no longer heard   History of hiatal hernia  Hyperlipidemia    Legally blind in right eye, as defined in [place]    Lumbar herniated disc    Migraines    On home oxygen therapy 06/15/2022   Pt was given O2 on D/C from [facility] 04/2022   OSA on CPAP 06/15/2022   PONV (postoperative nausea and vomiting)    Sciatica    Family History  Problem Relation Age of Onset   Arthritis Mother    Hypertension Mother    Diabetes Mother    High blood pressure Mother    Cancer Father        Lung   Colon cancer Neg Hx    Colon polyps Neg Hx    Esophageal cancer Neg Hx    Rectal cancer Neg Hx    Stomach cancer Neg Hx    Past Surgical History:  Procedure Laterality Date   ABLATION     BREAST LUMPECTOMY WITH RADIOACTIVE SEED AND SENTINEL LYMPH NODE BIOPSY Right 11/23/2020   Procedure: RIGHT BREAST LUMPECTOMY WITH RADIOACTIVE SEED AND RIGHT AXILLARY SENTINEL LYMPH NODE BIOPSY;  Surgeon: Emelia Loron, MD;  Location: [facility] OR;  Service: General;  Laterality: Right;   BREAST SURGERY Bilateral 2011   Breast Reduction Surgery   BUNIONECTOMY     CARDIAC CATHETERIZATION     10/05/04 [facility]): LM < 25%, otherwise normal coronaries. No pulmonary  HTN, Mildly enlarged RV. Secundum ASD s/p closure.   CARDIAC SURGERY     CATARACT EXTRACTION     CLEFT PALATE REPAIR     s/p cleft lip and palate repair   EYE SURGERY Right 2019   right eye removed   LUMBAR LAMINECTOMY/DECOMPRESSION MICRODISCECTOMY Left 05/23/2016   Procedure: LEFT L4-L5 LATERAL RECESS DECOMPRESSION WITH CENTRAL AND RIGHT DECOMPRESSION VIA LEFT SIDE;  Surgeon: Kerrin Champagne, MD;  Location: [facility] OR;  Service: Orthopedics;  Laterality: Left;   LUMBAR LAMINECTOMY/DECOMPRESSION MICRODISCECTOMY Left 05/23/2016   Procedure: LUMBAR LAMINECTOMY/DECOMPRESSION MICRODISCECTOMY Lumbar five - Sacral One 1 LEVEL;  Surgeon: Kerrin Champagne, MD;  Location: [facility] OR;  Service: Orthopedics;  Laterality: Left;   REDUCTION MAMMAPLASTY  2011   SHOULDER INJECTION Left 05/23/2016   Procedure: SHOULDER INJECTION;  Surgeon: Kerrin Champagne, MD;  Location: [facility] OR;  Service: Orthopedics;  Laterality: Left;  band-aid per pa-c   TRANSTHORACIC ECHOCARDIOGRAM     12/15/05 [facility]): Mild LVH, EF > 55%, grade 1 diastolic dysfunction, Trivial MR/PR/TR.   TUBAL LIGATION     Social History   Occupational History   Not on file  Tobacco Use   Smoking status: Former    Current packs/day: 0.00    Average packs/day: 0.3 packs/day for 25.0 years (6.3 ttl pk-yrs)    Types: Cigarettes    Start date: 02/04/1993    Quit date: 02/04/2018    Years since quitting: 5.5   Smokeless tobacco: Never  Vaping Use   Vaping status: Never Used  Substance and Sexual Activity   Alcohol use: No   Drug use: No   Sexual activity: Not Currently

## 2023-08-11 ENCOUNTER — Encounter: Payer: Self-pay | Admitting: Physical Medicine and Rehabilitation

## 2023-08-15 ENCOUNTER — Ambulatory Visit: Payer: Medicare Other | Admitting: Physical Therapy

## 2023-08-15 DIAGNOSIS — Z961 Presence of intraocular lens: Secondary | ICD-10-CM | POA: Diagnosis not present

## 2023-08-15 DIAGNOSIS — H1789 Other corneal scars and opacities: Secondary | ICD-10-CM | POA: Diagnosis not present

## 2023-08-16 ENCOUNTER — Ambulatory Visit: Admitting: Physical Therapy

## 2023-08-16 ENCOUNTER — Encounter: Payer: Self-pay | Admitting: Physical Therapy

## 2023-08-16 ENCOUNTER — Ambulatory Visit: Payer: Self-pay | Admitting: Family

## 2023-08-16 ENCOUNTER — Ambulatory Visit: Admitting: Sports Medicine

## 2023-08-16 DIAGNOSIS — M6281 Muscle weakness (generalized): Secondary | ICD-10-CM | POA: Diagnosis not present

## 2023-08-16 DIAGNOSIS — R2689 Other abnormalities of gait and mobility: Secondary | ICD-10-CM

## 2023-08-16 DIAGNOSIS — R2681 Unsteadiness on feet: Secondary | ICD-10-CM | POA: Diagnosis not present

## 2023-08-16 DIAGNOSIS — R293 Abnormal posture: Secondary | ICD-10-CM | POA: Diagnosis not present

## 2023-08-16 DIAGNOSIS — M5459 Other low back pain: Secondary | ICD-10-CM | POA: Diagnosis not present

## 2023-08-16 NOTE — Telephone Encounter (Signed)
 Noted.

## 2023-08-16 NOTE — Telephone Encounter (Signed)
 Copied from CRM [id number]. Topic: Clinical - Red Word Triage >> Aug 16, 2023  9:19 AM Dennison Nancy wrote: Red Word that prompted transfer to Nurse Triage: Lonia Blood a physical therapist with [facility]  , swelling in left ankle into lower into calves  in a lot of pain very point tender to touch up ankle up to calve     Chief Complaint: Ankle pain Symptoms: Ankle/calf pain and swelling Frequency: Ongoing since yesterday Pertinent Negatives: Patient denies fever, warmth to touch, redness Disposition: [] ED /[] Urgent Care (no appt availability in office) / [x] Appointment(In office/virtual)/ []  [facility]/ [] Home Care/ [] Refused Recommended Disposition /[] [place] [facility]/ []  Follow-up with PCP Additional Notes: Patient's physical therapist called the office with patient also present on the line. Patient is experiencing pain and swelling in her left ankle and calf. Symptoms started yesterday. Appointment scheduled for today.   Reason for Disposition  [1] Calf swelling AND [2] only 1 side  Answer Assessment - Initial Assessment Questions 1. ONSET: "When did the pain start?"      Yesterday  2. LOCATION: "Where is the pain located?"      Left ankle/ calf area  3. PAIN: "How bad is the pain?"    (Scale 1-10; or mild, moderate, severe)  - MILD (1-3): doesn't interfere with normal activities.   - MODERATE (4-7): interferes with normal activities (e.g., work or school) or awakens from sleep, limping.   - SEVERE (8-10): excruciating pain, unable to do any normal activities, unable to walk.      7, hurts to walk  4. OTHER SYMPTOMS: "Do you have any other symptoms?" (e.g., calf pain, rash, fever, swelling)     Ankle/calf swelling (tender to touch), nickel sized bruise on calf that feels firm  Protocols used: Ankle Pain-A-AH

## 2023-08-16 NOTE — Telephone Encounter (Signed)
Message routed to Dr.Veludandi

## 2023-08-16 NOTE — Therapy (Signed)
 OUTPATIENT PHYSICAL THERAPY NEURO TREATMENT   Patient Name: Brandy [name] MRN: [id number] DOB:1965-10-02, 58 y.o., female Today's Date: 08/16/2023   PCP: Caesar Bookman, NP  REFERRING PROVIDER: Caesar Bookman, NP   END OF SESSION:  PT End of Session - 08/16/23 0849     Visit Number 3    Number of Visits 17    Date for PT Re-Evaluation 09/22/23    Authorization Type [employer] Medicare    Authorization Time Period approved 17 PT visits from 07/26/2023-09/20/2023    Authorization - Visit Number 3    Authorization - Number of Visits 17    Progress Note Due on Visit 10    PT Start Time 0850    PT Stop Time 0935    PT Time Calculation (min) 45 min    Activity Tolerance Patient tolerated treatment well    Behavior During Therapy [facility] for tasks assessed/performed               Past Medical History:  Diagnosis Date   Acute pansinusitis 08/02/2017   Anxiety    Arthritis    ASD (atrial septal defect)    s/p closure with Amplatzer device 10/05/04 (Dr. Celso Amoreena Neubert, [facility]) 10/05/04   Cancer [facility])    Cataract    Chronic pain    CKD (chronic kidney disease)    Dyspnea    Fatty liver 09/04/2019   GERD (gastroesophageal reflux disease)    Heart murmur    no longer heard   History of hiatal hernia    Hyperlipidemia    Legally blind in right eye, as defined in [place]    Lumbar herniated disc    Migraines    On home oxygen therapy 06/15/2022   Pt was given O2 on D/C from [facility] 04/2022   OSA on CPAP 06/15/2022   PONV (postoperative nausea and vomiting)    Sciatica    Past Surgical History:  Procedure Laterality Date   ABLATION     BREAST LUMPECTOMY WITH RADIOACTIVE SEED AND SENTINEL LYMPH NODE BIOPSY Right 11/23/2020   Procedure: RIGHT BREAST LUMPECTOMY WITH RADIOACTIVE SEED AND RIGHT AXILLARY SENTINEL LYMPH NODE BIOPSY;  Surgeon: Emelia Loron, MD;  Location: [facility] OR;  Service: General;  Laterality: Right;   BREAST SURGERY Bilateral 2011   Breast Reduction  Surgery   BUNIONECTOMY     CARDIAC CATHETERIZATION     10/05/04 [facility]): LM < 25%, otherwise normal coronaries. No pulmonary HTN, Mildly enlarged RV. Secundum ASD s/p closure.   CARDIAC SURGERY     CATARACT EXTRACTION     CLEFT PALATE REPAIR     s/p cleft lip and palate repair   EYE SURGERY Right 2019   right eye removed   LUMBAR LAMINECTOMY/DECOMPRESSION MICRODISCECTOMY Left 05/23/2016   Procedure: LEFT L4-L5 LATERAL RECESS DECOMPRESSION WITH CENTRAL AND RIGHT DECOMPRESSION VIA LEFT SIDE;  Surgeon: Kerrin Champagne, MD;  Location: [facility] OR;  Service: Orthopedics;  Laterality: Left;   LUMBAR LAMINECTOMY/DECOMPRESSION MICRODISCECTOMY Left 05/23/2016   Procedure: LUMBAR LAMINECTOMY/DECOMPRESSION MICRODISCECTOMY Lumbar five - Sacral One 1 LEVEL;  Surgeon: Kerrin Champagne, MD;  Location: [facility] OR;  Service: Orthopedics;  Laterality: Left;   REDUCTION MAMMAPLASTY  2011   SHOULDER INJECTION Left 05/23/2016   Procedure: SHOULDER INJECTION;  Surgeon: Kerrin Champagne, MD;  Location: [facility] OR;  Service: Orthopedics;  Laterality: Left;  band-aid per pa-c   TRANSTHORACIC ECHOCARDIOGRAM     12/15/05 [facility]): Mild LVH, EF > 55%, grade 1 diastolic dysfunction, Trivial  MR/PR/TR.   TUBAL LIGATION     Patient Active Problem List   Diagnosis Date Noted   Chronic obstructive pulmonary disease, unspecified COPD type (HCC) 03/15/2023   Major depressive disorder, single episode, severe without psychotic features (HCC) 03/15/2023   Chronic respiratory failure with hypoxia (HCC) 05/08/2022   Orthostatic hypotension 04/24/2022   Hyperbilirubinemia 04/24/2022   Snoring 07/14/2021   Fatigue 07/14/2021   Daytime somnolence 07/14/2021   Status post device closure of ASD 06/03/2021   Secundum ASD 06/03/2021   Decreased diffusion capacity 05/14/2021   Dyspnea 03/04/2021   Pulmonary nodules 04/01/2020   Primary malignant neoplasm of breast with metastasis (HCC) 02/24/2020   Spondylosis without myelopathy or radiculopathy,  lumbosacral region 12/31/2019   Chronic low back pain (Bilateral) w/o sciatica 12/31/2019   Chronic hip pain (3ry area of Pain) (Bilateral) (R>L) 11/27/2019   Chronic sacroiliac joint pain (Left) 11/27/2019   Chronic pain syndrome 11/11/2019   Pharmacologic therapy 11/11/2019   Disorder of skeletal system 11/11/2019   Problems influencing health status 11/11/2019   Abnormal MRI, lumbar spine (08/22/2018) 11/11/2019   Chronic low back pain (1ry area of Pain) (Bilateral) w/ sciatica (Bilateral) 11/11/2019   DDD (degenerative disc disease), lumbosacral 11/11/2019   Grade 1 Anterolisthesis (L2-3, L3-4) 11/11/2019   Grade 1 Retrolisthesis (L4-5, L5-S1) 11/11/2019   Lumbar facet hypertrophy (Multilevel) (Bilateral) 11/11/2019   Lumbar facet syndrome (Multilevel) (Bilateral) (R>L) 11/11/2019   Chronic lower extremity pain (2ry area of Pain) (Bilateral) (R>L) 11/11/2019   Neurogenic pain 11/11/2019   Chronic musculoskeletal pain 11/11/2019   Infiltrating ductal carcinoma of breast (HCC) 10/08/2019   Fatty liver 09/04/2019   Nausea & vomiting 09/04/2019   Obesity (BMI 30-39.9) 09/04/2019   GERD (gastroesophageal reflux disease)    Hyperlipidemia    CKD (chronic kidney disease), stage III (HCC)    Depression    Malignant hypertension    Ocular proptosis 02/14/2019   PVD (posterior vitreous detachment), left eye 02/14/2019   Chorioretinal scar of left eye 02/14/2019   Vitreous floaters of left eye 02/14/2019   Bilateral leg weakness 01/24/2019   Insomnia 08/02/2017   Low back pain radiating to both legs 08/02/2017   Migraine 08/02/2017   Neuropathy 08/02/2017   Spinal stenosis, 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    Class: Chronic   Left shoulder tendonitis 05/23/2016    Class: Acute   Post laminectomy syndrome 05/23/2016   Anxiety disorder 01/07/2015    ONSET DATE: 07/20/2023 (MD referral)  REFERRING DIAG:  M54.16 (ICD-10-CM) - Lumbar back pain with radiculopathy  affecting left lower extremity  R26.81 (ICD-10-CM) - Unsteady gait  R29.6 (ICD-10-CM) - Frequent falls    THERAPY DIAG:  Muscle weakness (generalized)  Unsteadiness on feet  Other abnormalities of gait and mobility  Rationale for Evaluation and Treatment: Rehabilitation  SUBJECTIVE:  SUBJECTIVE STATEMENT: My ankle is hurting worse and I think I need to see the doctor again.  Usually have the walker, but was in a hurry today; no falls.  Pt accompanied by: self  PERTINENT HISTORY: Per MD:  She has been experiencing worsening back pain following a fall last month during a snowstorm. She slipped on ice and fell, initially without pain, but began experiencing consistent pain about a week later. The pain is primarily located in the lower back, radiating more to the left side, with tenderness in the buttocks and back of the thigh. On February 9th, she experienced severe pain radiating from her left foot up her leg, causing her to fall back into bed. No current pain radiating down the leg, weakness, tingling or numbness. No swelling or bruising in the affected areas. She has a history of back pain and underwent a nerve ablation procedure at the end of January to manage her symptoms.  PMH:  breast cancer with metastasis;  Lumbar laminectomy/decompression microdiscectomy (Left, 05/23/2016); Lumbar laminectomy/decompression microdiscectomy (Left, 05/23/2016); Shoulder injection (Left, 05/23/2016); Reduction mammaplasty (2011); Eye surgery (Right, 2019); Tubal ligation; and Breast lumpectomy with radioactive seed and sentinel lymph node biopsy (Right, 11/23/2020).   PAIN:  Are you having pain? Yes: NPRS scale: 5/10 Pain location: bilat across low back, R hip, L hip> knee area Pain description: burning Aggravating  factors: walking with cane aggravates Relieving factors: sitting, resting *Pt does have hx of chronic low back pain-muscle tightness and soreness* PAIN:  Are you having pain? Yes: NPRS scale: 7/10 Pain location: L ankle Pain description: worsening achy pain Aggravating factors: walking, weightbearing Relieving factors: Voltaren gel, elevate, and medication  PRECAUTIONS: Fall; decreased vision Per MD note:  She has issues with her eyes, specifically dry eyes, requiring the use of artificial tears every hour. She has a prosthetic eye (R) and uses lubricant and tapes her left eyelid shut at night to prevent dryness. Her vision can be distorted at times, affecting her daily activities.   RED FLAGS: None   WEIGHT BEARING RESTRICTIONS: No  FALLS: Has patient fallen in last 6 months? Yes. Number of falls at least 3 falls  LIVING ENVIRONMENT: Lives with: lives with their spouse Lives in: House/apartment Stairs:  6 steps, multiple steps -getting ready to move into apartment where there are no steps Has following equipment at home: Single point cane and Walker - 4 wheeled  PLOF: Independent with household mobility with device and Independent with community mobility with device  PATIENT GOALS: Pt's goals for therapy become painfree, more independent, exercise more  OBJECTIVE:     TODAY'S TREATMENT: 08/15/23 Assessed pt's ankle pain: Assisted pt to take off her ankle brace and pt reports increased tenderness and edema noted L ankle Circumferential measurements taken 20 cm below knee:  L 26.4 cm, R 24 cm Pt tender to palpation along superior aspect of L ankle, and with palpation, pt is tender to touch along front of lower calf and shin up to medial and lateral aspect of L calf No warmth or redness noted, but pt does have a darkened/bruised area noted at superior medial front aspect of L calf that is firm and very tender to touch Asked pt to gently perform ROM for L ankle, and she is very  tender throughout L ankle and calf With pt permission, called PCP and assisted pt to make an appointment for later today Educated pt that she needs to follow up with PCP about the pain in ankle and  calf Educated pt that she needs to follow up with [facility] about increased ankle pain  With gait today, pt has very antalgic pattern, with decreased (almost no) weightbearing through LLE, using cane to come into therapy session and reaching for furniture PT has pt use rollator walker to leave clinic, and pt continues to have antalgic pattern, decreased (almost no) weightbearing through LLE  Educated pt that if symptoms worsen, she needs to contact 911 or proceed to ED.      HOME EXERCISE PROGRAM Last updated: 08/03/23 Access Code: [email or web address] URL: [email or web address] Date: 08/03/2023 Prepared by: [facility] - Outpatient  Rehab - [facility]  Exercises - Supine Posterior Pelvic Tilt  - 1 x daily - 7 x weekly - 1-2 sets - 10 reps - Supine Hamstring Stretch with Strap  - 1 x daily - 5 x weekly - 2 sets - 30 sec hold - Supine Lower Trunk Rotation  - 1 x daily - 5 x weekly - 2 sets - 20 reps - Seated Anterior Pelvic Tilt  - 1 x daily - 7 x weekly - 1-2 sets - 10 reps   PATIENT EDUCATION: Education details: calf pain and ankle pain follow up needed-to go to PCP today (see above for details), use walker for safety with gait Person educated: Patient Education method: Explanation Education comprehension: verbalized understanding  ---------------------------------------------  Note: Objective measures were completed at Evaluation unless otherwise noted.  DIAGNOSTIC FINDINGS: x-rays to be completed 07/26/2023  COGNITION: Overall cognitive status: Within functional limits for tasks assessed   SENSATION: No reports of numbness   POSTURE: rounded shoulders, forward head, and posterior pelvic tilt  LOWER EXTREMITY ROM:     Active  Right Eval Left Eval  Hip  flexion    Hip extension    Hip abduction    Hip adduction    Hip internal rotation    Hip external rotation    Knee flexion    Knee extension    Ankle dorsiflexion 5 5  Ankle plantarflexion    Ankle inversion    Ankle eversion     (Blank rows = not tested)  LOWER EXTREMITY MMT:    MMT Right Eval Left Eval  Hip flexion 4+ 4+  Hip extension    Hip abduction 4 4  Hip adduction 4 4  Hip internal rotation    Hip external rotation    Knee flexion 4 4  Knee extension 4+ 4+  Ankle dorsiflexion 4 4  Ankle plantarflexion    Ankle inversion    Ankle eversion    (Blank rows = not tested)  LUMBAR: Palpation:  Tender to palpation along paraspinals thoracic spine, SI joint area, bilateral buttocks/gluts/piriformis; pt tender to palpation along IT band R>L Baseline pain in standing:  0/10 Repeated standing flexion: 3/10 pain, into bilat buttocks Repeated standing extension:1-2/10 pain centralized at mid- low back Limited in lumbar extension flexibility  Hamstring flexibility from supine 90/90:  R  -15 degrees; L -20 degrees SLR test from supine:  R 85 degrees, L 80 degrees  TRANSFERS: Assistive device utilized: None BUE support Sit to stand: Modified independence Stand to sit: Modified independence   GAIT: Gait pattern: step through pattern, antalgic, lateral lean- Right, and lateral lean- Left Distance walked: 40-50 ft Assistive device utilized: Single point cane Level of assistance: Modified independence Comments: Has rollator at home, reports less pain with gait when using rollator   PATIENT SURVEYS:  Modified Oswestry (Back Index):  48  TREATMENT DATE: 07/26/2023    PATIENT EDUCATION: Education details: Eval results, POC; Educated pt on posture/positioning with use of lumbar roll with sititng; discussed proper positioning/height of rollator;  discussed pain management with prioritizing movements that centralize symptoms and lessen movements that cause symptom radiation.  Initiated HEP-see below Person educated: Patient Education method: Explanation, Demonstration, and Handouts Education comprehension: verbalized understanding, returned demonstration, and needs further education  HOME EXERCISE PROGRAM: Access Code: [email or web address] URL: [email or web address] Date: 07/26/2023 Prepared by: [facility] - Outpatient  Rehab - [facility]  Exercises - Supine Posterior Pelvic Tilt  - 1 x daily - 7 x weekly - 1-2 sets - 10 reps - Seated Anterior Pelvic Tilt  - 1 x daily - 7 x weekly - 1-2 sets - 10 reps  GOALS: Goals reviewed with patient? Yes  SHORT TERM GOALS: Target date: 08/25/2023  Pt will be independent with HEP for improved strength, flexibility, balance, gait. Baseline: Goal status: IN PROGRESS  2.  Pt will report low back pain reduced by 50% during functional daily activities. Baseline:  Goal status:IN PROGRESS  3.  Berg Balance test to be assessed, with pt to improve by at least 5 points for decreased fall risk. Baseline:  Goal status:IN PROGRESS  4.  Pt will verbalize understanding of fall prevention and posture/body mechanics for decreased pain/decreased fall risk.  Baseline:  Goal status: IN PROGRESS   LONG TERM GOALS: Target date: 09/22/2023  Pt will be independent with HEP for improved strength, flexibility, pain, balance, gait. Baseline:  Goal status: IN PROGRESS  2.  Back index score to decrease to </= 40 for improved back pain and decreased disability. Baseline:  Goal status: IN PROGRESS  3.  Berg Score to improve by 10 points from baseline to demo decreased fall risk. Baseline:  Goal status:IN PROGRESS  4.  Pt will verbalize plans for continued community fitness upon d/c from PT to maximize gains in PT. Baseline:  Goal status: IN PROGRESS  ASSESSMENT:  CLINICAL IMPRESSION: Pt  presents today with increased pain in L ankle and foot.  She uses cane to come into therapy, and has very antalgic, unsafe gait pattern, reaching for furniture and wall with other hand.  Educated pt to use walker for safety until she can see MD about the pain in her ankle.  Upon further inspection of ankle, she does have some edema and significant point tenderness along front of L calf.  Pt will plan to follow up with MD later today.  Pt will continue to benefit from skilled PT towards goals for improved functional mobility and decreased fall risk.    OBJECTIVE IMPAIRMENTS: Abnormal gait, decreased balance, decreased mobility, difficulty walking, decreased ROM, decreased strength, impaired flexibility, postural dysfunction, and pain.   ACTIVITY LIMITATIONS: lifting, sitting, standing, stairs, transfers, and locomotion level  PARTICIPATION LIMITATIONS: community activity and exercise  PERSONAL FACTORS: 3+ comorbidities: see above  are also affecting patient's functional outcome.   REHAB POTENTIAL: Good  CLINICAL DECISION MAKING: Evolving/moderate complexity  EVALUATION COMPLEXITY: Moderate  PLAN:  PT FREQUENCY: 2x/week  PT DURATION: 8 weeks plus eval visit  PLANNED INTERVENTIONS: 97110-Therapeutic exercises, 97530- Therapeutic activity, [id number]- Neuromuscular re-education, 97535- Self Care, [id number]- Manual therapy, [id number]- Gait training, [phone]- Aquatic Therapy, [phone]- Electrical stimulation (unattended), 97035- Ultrasound, Patient/Family education, Balance training, DME instructions, and Moist heat  PLAN FOR NEXT SESSION:  Continue to build HEP to promote low back flexibility, hamstring and gastroc flexibility, decreased pain in low back and buttocks (standing and seated extension  seem to centralize pain); check Berg Balance score and address body mechanics and fall prevention.     *HOLD on Aquatics for now*.  Will need to give aquatic information (she will do this after several weeks in the  clinic)  AQUATICS: Frequency: (after 3 weeks in clinic), pt to transition to 1x/wk aquatics and 1x/wk in clinic Duration: 3-5 weeks Special Instruction: flexibility, strength, balance work; pt is interested in aquatic exercise and may benefit from HEP for aquatic therapy and discussion on where to do further aquatic exercise      Lonia Blood, PT 08/16/23 9:56 AM Phone: [phone] Fax: [phone]  [facility] Re[facility] at [facility] [address] [place], [place] [address] Phone # [phone] Fax # [phone]

## 2023-08-17 ENCOUNTER — Ambulatory Visit: Admitting: Family

## 2023-08-17 ENCOUNTER — Other Ambulatory Visit: Payer: Self-pay

## 2023-08-17 ENCOUNTER — Ambulatory Visit: Payer: Medicare Other | Admitting: Physical Therapy

## 2023-08-17 ENCOUNTER — Encounter: Payer: Self-pay | Admitting: Family

## 2023-08-17 VITALS — BP 118/88 | HR 87 | Temp 97.2°F | Resp 18 | Ht 68.0 in | Wt 205.0 lb

## 2023-08-17 DIAGNOSIS — F4321 Adjustment disorder with depressed mood: Secondary | ICD-10-CM | POA: Diagnosis not present

## 2023-08-17 DIAGNOSIS — L989 Disorder of the skin and subcutaneous tissue, unspecified: Secondary | ICD-10-CM | POA: Diagnosis not present

## 2023-08-17 DIAGNOSIS — M25472 Effusion, left ankle: Secondary | ICD-10-CM | POA: Diagnosis not present

## 2023-08-17 DIAGNOSIS — C50919 Malignant neoplasm of unspecified site of unspecified female breast: Secondary | ICD-10-CM

## 2023-08-17 DIAGNOSIS — M25572 Pain in left ankle and joints of left foot: Secondary | ICD-10-CM

## 2023-08-17 DIAGNOSIS — R252 Cramp and spasm: Secondary | ICD-10-CM

## 2023-08-17 NOTE — Progress Notes (Signed)
 Provider: Richarda Blade FNP-C  Katia Hannen, Donalee Citrin, NP  Patient Care Team: Mitali Shenefield, Donalee Citrin, NP as PCP - General (Family Medicine) Thomasene Ripple, DO as PCP - Cardiology (Cardiology) Elie Confer, NP as Nurse Practitioner  Extended Emergency Contact Information Primary Emergency Contact: [facility] Address: [address]          [place], [place] [address] D[place] of [place] Home Phone: [phone] Relation: Daughter Secondary Emergency Contact: Tucker,[name] Mobile Phone: [phone] Relation: Other Preferred language: English  Code Status:  Full Code  Goals of care: Advanced Directive information    07/26/2023    8:08 AM  Advanced Directives  Does Patient Have a Medical Advance Directive? Yes;No  Does patient want to make changes to medical advance directive? Yes (MAU/Ambulatory/Procedural Areas - Information given)     Chief Complaint  Patient presents with   Ankle Pain    Ankle/calf swelling and pain since yesterday    Discussed the use of AI scribe software for clinical note transcription with the patient, who gave verbal consent to proceed.  History of Present Illness   The patient is a 58 y.o female, presents with left leg pain and swelling.  She has been experiencing left leg pain and swelling, particularly in the ankle, which was previously fractured. Initially, she wore a soft brace daily, but reduced its use as her condition improved. Recently, the pain and swelling have increased, especially after prolonged periods of having her leg down, such as during bus travel. The pain is severe, making it difficult to bear weight on the foot, even with a cane. Swelling extends up the leg, and the area is tender to touch. She has been attending physical therapy twice a week but missed sessions due to her husband's passing. Her therapist noted increased swelling and tenderness during the last session.  She experiences muscle cramps in the left calf and  arch of the foot, occasionally in her hand, which she attributes to running out of magnesium supplements. She has since replenished her supply.  She is undergoing significant emotional distress following her husband's unexpected death, which required her to be on her feet frequently for funeral arrangements. This period of emotional distress coincided with a bruise on her leg, believed to have occurred when she hit it on a hospital bed.  She has a history of falls, attributed to her previous living situation where she was unable to use her walker effectively. She has since moved to a more accommodating apartment, improving her mobility and safety.  No redness or heat in the swollen area of her leg. No other bumps or pain in other areas of her body.    Past Medical History:  Diagnosis Date   Acute pansinusitis 08/02/2017   Anxiety    Arthritis    ASD (atrial septal defect)    s/p closure with Amplatzer device 10/05/04 (Dr. Celso Amy, [facility]) 10/05/04   [facility])    Cataract    Chronic pain    CKD (chronic kidney disease)    Dyspnea    Fatty liver 09/04/2019   GERD (gastroesophageal reflux disease)    Heart murmur    no longer heard   History of hiatal hernia    Hyperlipidemia    Legally blind in right eye, as defined in [place]    Lumbar herniated disc    Migraines    On home oxygen therapy 06/15/2022   Pt was given O2 on D/C from [facility] 04/2022   OSA on  CPAP 06/15/2022   PONV (postoperative nausea and vomiting)    Sciatica    Past Surgical History:  Procedure Laterality Date   ABLATION     BREAST LUMPECTOMY WITH RADIOACTIVE SEED AND SENTINEL LYMPH NODE BIOPSY Right 11/23/2020   Procedure: RIGHT BREAST LUMPECTOMY WITH RADIOACTIVE SEED AND RIGHT AXILLARY SENTINEL LYMPH NODE BIOPSY;  Surgeon: Emelia Loron, MD;  Location: [facility] OR;  Service: General;  Laterality: Right;   BREAST SURGERY Bilateral 2011   Breast Reduction Surgery   BUNIONECTOMY     CARDIAC CATHETERIZATION      10/05/04 [facility]): LM < 25%, otherwise normal coronaries. No pulmonary HTN, Mildly enlarged RV. Secundum ASD s/p closure.   CARDIAC SURGERY     CATARACT EXTRACTION     CLEFT PALATE REPAIR     s/p cleft lip and palate repair   EYE SURGERY Right 2019   right eye removed   LUMBAR LAMINECTOMY/DECOMPRESSION MICRODISCECTOMY Left 05/23/2016   Procedure: LEFT L4-L5 LATERAL RECESS DECOMPRESSION WITH CENTRAL AND RIGHT DECOMPRESSION VIA LEFT SIDE;  Surgeon: Kerrin Champagne, MD;  Location: [facility] OR;  Service: Orthopedics;  Laterality: Left;   LUMBAR LAMINECTOMY/DECOMPRESSION MICRODISCECTOMY Left 05/23/2016   Procedure: LUMBAR LAMINECTOMY/DECOMPRESSION MICRODISCECTOMY Lumbar five - Sacral One 1 LEVEL;  Surgeon: Kerrin Champagne, MD;  Location: [facility] OR;  Service: Orthopedics;  Laterality: Left;   REDUCTION MAMMAPLASTY  2011   SHOULDER INJECTION Left 05/23/2016   Procedure: SHOULDER INJECTION;  Surgeon: Kerrin Champagne, MD;  Location: [facility] OR;  Service: Orthopedics;  Laterality: Left;  band-aid per pa-c   TRANSTHORACIC ECHOCARDIOGRAM     12/15/05 [facility]): Mild LVH, EF > 55%, grade 1 diastolic dysfunction, Trivial MR/PR/TR.   TUBAL LIGATION      Allergies  Allergen Reactions   Zithromax [Azithromycin] Shortness Of Breath, Itching and Other (See Comments)    TOTAL BODY ITCHING [EVEN SOLES OF FEET] WHEEZING    Tramadol Itching and Other (See Comments)    Has taken recently without any side effects.   Psyllium Nausea And Vomiting and Other (See Comments)    Metamucil. Sneezing      Outpatient Encounter Medications as of 08/17/2023  Medication Sig   acetaminophen (TYLENOL) 500 MG tablet Take 500 mg by mouth daily as needed for moderate pain.   albuterol (VENTOLIN HFA) 108 (90 Base) MCG/ACT inhaler Inhale 2 puffs into the lungs every 6 (six) hours as needed for wheezing or shortness of breath.   ALPRAZolam (XANAX) 1 MG tablet TAKE 1 TABLET BY MOUTH AT  BEDTIME AS NEEDED FOR ANXIETY   amitriptyline (ELAVIL) 75 MG tablet  Take 1 tablet (75 mg total) by mouth at bedtime.   Buprenorphine HCl (BELBUCA) 300 MCG FILM Place 300 mcg inside cheek 2 (two) times daily.   Carboxymethylcellul-Glycerin (LUBRICATING EYE DROPS OP) Place 1 drop into the right eye in the morning, at noon, and at bedtime.   cyanocobalamin (VITAMIN B12) 500 MCG tablet Take 1 tablet (500 mcg total) by mouth daily.   diphenoxylate-atropine (LOMOTIL) 2.5-0.025 MG tablet Take 1 tablet by mouth 3 (three) times daily as needed for diarrhea or loose stools.   Evolocumab (REPATHA SURECLICK) 140 MG/ML SOAJ Inject 140 mg into the skin every 14 (fourteen) days.   fluticasone-salmeterol (ADVAIR) 250-50 MCG/ACT AEPB Inhale 1 puff into the lungs in the morning and at bedtime.   ketoconazole (NIZORAL) 2 % cream Apply 1 Application topically 2 (two) times daily.   loperamide (IMODIUM) 2 MG capsule Take 1-2 capsules (2-4 mg total) by mouth  4 (four) times daily as needed for diarrhea or loose stools.   methocarbamol (ROBAXIN) 500 MG tablet Take 1 tablet (500 mg total) by mouth every 6 (six) hours as needed for muscle spasms.   midodrine (PROAMATINE) 10 MG tablet TAKE 1 TABLET BY MOUTH IN THE  MORNING AND AT BEDTIME   Multiple Vitamin (MULTIVITAMIN WITH MINERALS) TABS tablet Take 1 tablet by mouth daily.   omeprazole (PRILOSEC OTC) 20 MG tablet Take 20 mg by mouth daily.   PRESCRIPTION MEDICATION CPAP- At bedtime   triamcinolone cream (KENALOG) 0.1 % Apply 1 Application topically 2 (two) times daily.   VERZENIO 100 MG tablet TAKE 1 TABLET BY MOUTH TWICE DAILY   Vitamin D, Ergocalciferol, (DRISDOL) 1.25 MG (50000 UNIT) CAPS capsule TAKE 1 CAPSULE BY MOUTH EVERY 7  DAYS ( MONDAY )   XIIDRA 5 % SOLN Place 1 drop into the left eye 2 (two) times daily.   No facility-administered encounter medications on file as of 08/17/2023.    Review of Systems  Constitutional:  Negative for appetite change, chills, fatigue, fever and unexpected weight change.  Eyes:  Negative for  pain, discharge, redness, itching and visual disturbance.  Respiratory:  Negative for cough, chest tightness, shortness of breath and wheezing.   Cardiovascular:  Negative for chest pain, palpitations and leg swelling.  Gastrointestinal:  Negative for abdominal distention, abdominal pain, nausea and vomiting.  Musculoskeletal:  Positive for arthralgias and gait problem. Negative for back pain, joint swelling, myalgias, neck pain and neck stiffness.       Left ankle   Skin:  Negative for color change, pallor, rash and wound.  Neurological:  Negative for dizziness, weakness, light-headedness, numbness and headaches.  Psychiatric/Behavioral:  Negative for agitation, behavioral problems, confusion, hallucinations, self-injury, sleep disturbance and suicidal ideas. The patient is not nervous/anxious.     Immunization History  Administered Date(s) Administered   Hepatitis A 03/02/2023   Hepatitis B 03/02/2023   Influenza Inj Mdck Quad Pf 04/25/2019   Influenza,inj,Quad PF,6+ Mos 02/24/2020, 04/15/2022, 02/21/2023   Influenza,inj,quad, With Preservative 04/25/2019   Influenza-Unspecified 05/26/2021   Moderna Sars-Covid-2 Vaccination 06/29/2019, 08/03/2019, 06/24/2020   PFIZER(Purple Top)SARS-COV-2 Vaccination 05/26/2021   Pneumococcal Conjugate-13 05/29/2019   Pneumococcal Polysaccharide-23 07/29/2019   Tdap 02/21/2023   Unspecified SARS-COV-2 Vaccination 02/21/2023   Zoster Recombinant(Shingrix) 02/17/2020, 04/20/2020   Pertinent  Health Maintenance Due  Topic Date Due   MAMMOGRAM  09/13/2024   Colonoscopy  08/16/2027   INFLUENZA VACCINE  Completed      03/23/2023    1:25 PM 06/13/2023    8:17 AM 07/05/2023    8:32 AM 07/20/2023   10:38 AM 08/17/2023   10:42 AM  Fall Risk  Falls in the past year? 1 1 1 1  0  Was there an injury with Fall? 0 1 0 0 0  Was there an injury with Fall? - Comments  fx left ankle 9/24     Fall Risk Category Calculator 1 3 2 2  0  Patient at Risk for Falls Due  to History of fall(s)    No Fall Risks  Fall risk Follow up Falls evaluation completed;Education provided;Falls prevention discussed    Falls evaluation completed   Functional Status Survey:    Vitals:   08/17/23 1037  BP: 118/88  Pulse: 87  Resp: 18  Temp: (!) 97.2 F (36.2 C)  SpO2: 92%  Weight: 205 lb (93 kg)  Height: 5\' 8"  (1.727 m)   Body mass index is 31.17 kg/m. Physical  Exam MEASUREMENTS: Weight- 205. GENERAL: Alert, cooperative, well developed, no acute distress HEENT: Normocephalic, normal oropharynx, moist mucous membranes CHEST: Clear to auscultation bilaterally, no wheezes, rhonchi, or crackles CARDIOVASCULAR: Normal heart rate and rhythm, S1 and S2 normal without murmurs ABDOMEN: Soft, non-tender, non-distended, without organomegaly, normal bowel sounds EXTREMITIES: Feet appear swollen with tenderness, mild swelling in the leg, strong pulse in the foot, no cyanosis NEUROLOGICAL: Cranial nerves grossly intact, moves all extremities without gross motor or sensory deficit  PSYCHIATRY/BEHAVIORAL: Mood stable   Labs reviewed: Recent Labs    03/15/23 0927 04/03/23 1311 05/26/23 1127 06/23/23 0811 07/21/23 0804  NA 141   < > 139 139 140  K 4.2   < > 4.3 4.0 4.0  CL 102   < > 104 102 104  CO2 30   < > 30 28 29   GLUCOSE 84   < > 86 114* 97  BUN 18   < > 22* 25* 17  CREATININE 1.24*   < > 1.33* 1.29* 1.43*  CALCIUM 9.4   < > 9.2 9.4 9.3  MG 1.6  --   --   --   --    < > = values in this interval not displayed.   Recent Labs    05/26/23 1127 06/23/23 0811 07/21/23 0804  AST 67* 73* 67*  ALT 54* 72* 57*  ALKPHOS 239* 236* 236*  BILITOT 0.8 1.0 0.8  PROT 6.6 7.4 6.8  ALBUMIN 3.5 3.8 3.5   Recent Labs    05/26/23 1127 06/23/23 0811 07/21/23 0804  WBC 3.1* 4.6 4.2  NEUTROABS 0.8* 2.0 1.3*  HGB 11.7* 11.8* 11.4*  HCT 33.1* 34.9* 34.0*  MCV 109.2* 105.8* 107.6*  PLT 179 215 191   Lab Results  Component Value Date   TSH 1.95 03/15/2023   Lab  Results  Component Value Date   HGBA1C 5.3 07/26/2022   Lab Results  Component Value Date   CHOL 356 (H) 05/30/2023   HDL 122 05/30/2023   LDLCALC 189 (H) 05/30/2023   TRIG 243 (H) 05/30/2023   CHOLHDL 2.9 05/30/2023    Significant Diagnostic Results in last 30 days:  DG Hip Unilat W OR W/O Pelvis 2-3 Views Left Result Date: 07/31/2023 CLINICAL DATA:  Low back pain radiating to lower extremities and bilateral hips EXAM: DG HIP (WITH OR WITHOUT PELVIS) 2-3V LEFT COMPARISON:  None Available. FINDINGS: Osteoarthrosis of both hips with narrowing of the right hip superolateral and inferomedial compartment left hip inferomedial compartment. IMPRESSION: Osteoarthrosis of both hips. Electronically Signed   By: Shaaron Adler M.D.   On: 07/31/2023 15:10   DG Lumbar Spine Complete Result Date: 07/31/2023 CLINICAL DATA:  Low back pain radiating to the left lower extremity EXAM: LUMBAR SPINE - COMPLETE 4+ VIEW COMPARISON:  September 22, 2022 FINDINGS: No significant change compared with prior examination with severe degenerative disc disease particularly L5-S1 large anterior osteophytes. There is significant posterior bony spondylosis L4-L5 and narrowing of the L2-L3 disc space. No spondylolysis or listhesis with minimal dextroscoliosis and lateral syndesmotic osteophytes. IMPRESSION: Severe degenerative disc disease particularly L5-S1. Electronically Signed   By: Shaaron Adler M.D.   On: 07/31/2023 15:09    Assessment/Plan  Left Ankle Pain and Swelling Intermittent left ankle pain and swelling, exacerbated by prolonged standing and walking. Pain extends up the leg, with tenderness and swelling. Previous ankle fractures with a soft brace provided. Swelling likely due to fluid accumulation from prolonged leg dependency. Differential includes exacerbation of previous fractures  or new injury. Circulation is good, no evidence of blood clot. - Order x-ray of the left ankle to assess for fractures or new injury -  Advise leg elevation to reduce swelling - Instruct to monitor salt intake and continue using Mrs. Dash as a salt substitute - Advise to schedule an appointment with the foot and ankle specialist  Muscle Cramping Muscle cramping in the left calf and arch, and occasional cramping in the hand. Ran out of magnesium supplements, which may contribute to cramping. Resumed magnesium supplementation. - Continue magnesium supplementation  Skin Lesions Multiple skin lesions appear as hard, mole-like spots on the back and legs. Lesions not associated with heat or redness, suggesting no infection. Uses homemade soap, which may lack antibacterial properties. - Recommend using antibacterial soap such as Dial  Grief and Emotional Distress Experiencing grief and emotional distress following the recent death of her husband. Overwhelmed and difficulty processing emotions, particularly after deciding to remove her husband from life support. - Provide emotional support and encourage seeking counseling or support groups  General Health Maintenance Concerned about weight gain but has lost weight since last visit. Moved to a more spacious apartment accommodating mobility needs better, reducing fall risk. - Reassure about weight loss progress - Provide a letter stating the need to move due to fall risk in previous apartment  Follow-up Upcoming appointments for regular follow-up and Medicare visit. Needs to schedule an appointment with the foot and ankle specialist. - Schedule follow-up appointment on April 9 for regular six-month follow-up - Schedule Medicare visit on April 25 - Ensure scheduling an appointment with the foot and ankle specialist    Family/ staff Communication: Reviewed plan of care with patient verbalized understanding   Labs/tests ordered: None   Next Appointment: Return if symptoms worsen or fail to improve.   Total time: 30 minutes. Greater than 50% of total time spent doing patient  education regarding left ankle pain,muscle cramps,Grief,health maintenance including symptom/medication management.   Caesar Bookman, NP

## 2023-08-17 NOTE — Patient Instructions (Signed)
-   Please get left ankle X-ray at [facility] imaging at [facility] then will call you with results.

## 2023-08-18 ENCOUNTER — Inpatient Hospital Stay: Payer: Medicare Other | Attending: Hematology

## 2023-08-18 ENCOUNTER — Inpatient Hospital Stay: Payer: Medicare Other | Admitting: Hematology

## 2023-08-18 ENCOUNTER — Inpatient Hospital Stay: Payer: Medicare Other

## 2023-08-18 DIAGNOSIS — E876 Hypokalemia: Secondary | ICD-10-CM | POA: Insufficient documentation

## 2023-08-18 DIAGNOSIS — C7800 Secondary malignant neoplasm of unspecified lung: Secondary | ICD-10-CM | POA: Insufficient documentation

## 2023-08-18 DIAGNOSIS — Z5111 Encounter for antineoplastic chemotherapy: Secondary | ICD-10-CM | POA: Insufficient documentation

## 2023-08-18 DIAGNOSIS — Z170 Estrogen receptor positive status [ER+]: Secondary | ICD-10-CM | POA: Insufficient documentation

## 2023-08-18 DIAGNOSIS — C50511 Malignant neoplasm of lower-outer quadrant of right female breast: Secondary | ICD-10-CM | POA: Insufficient documentation

## 2023-08-18 DIAGNOSIS — Z87891 Personal history of nicotine dependence: Secondary | ICD-10-CM | POA: Insufficient documentation

## 2023-08-18 DIAGNOSIS — Z79899 Other long term (current) drug therapy: Secondary | ICD-10-CM | POA: Insufficient documentation

## 2023-08-18 DIAGNOSIS — Z79818 Long term (current) use of other agents affecting estrogen receptors and estrogen levels: Secondary | ICD-10-CM | POA: Insufficient documentation

## 2023-08-22 ENCOUNTER — Ambulatory Visit: Payer: Medicare Other | Admitting: Rehabilitation

## 2023-08-22 ENCOUNTER — Other Ambulatory Visit: Payer: Self-pay | Admitting: Hematology

## 2023-08-22 ENCOUNTER — Encounter: Payer: Self-pay | Admitting: Podiatry

## 2023-08-22 ENCOUNTER — Ambulatory Visit: Payer: Medicare Other | Admitting: Podiatry

## 2023-08-22 VITALS — Ht 68.0 in | Wt 205.0 lb

## 2023-08-22 DIAGNOSIS — C50919 Malignant neoplasm of unspecified site of unspecified female breast: Secondary | ICD-10-CM

## 2023-08-22 DIAGNOSIS — M79674 Pain in right toe(s): Secondary | ICD-10-CM | POA: Diagnosis not present

## 2023-08-22 DIAGNOSIS — M79675 Pain in left toe(s): Secondary | ICD-10-CM

## 2023-08-22 DIAGNOSIS — B351 Tinea unguium: Secondary | ICD-10-CM

## 2023-08-23 NOTE — Therapy (Signed)
 OUTPATIENT PHYSICAL THERAPY NEURO TREATMENT   Patient Name: Brandy [name] MRN: [id number] DOB:[birth year], 58 y.o., female Today's Date: 08/24/2023   PCP: Caesar Bookman, NP  REFERRING PROVIDER: Caesar Bookman, NP   END OF SESSION:  PT End of Session - 08/24/23 1015     Visit Number 4    Number of Visits 17    Date for PT Re-Evaluation 09/22/23    Authorization Type [employer] Medicare    Authorization Time Period approved 17 PT visits from 07/26/2023-09/20/2023    Authorization - Visit Number 4    Authorization - Number of Visits 17    Progress Note Due on Visit 10    PT Start Time 0843    PT Stop Time 0925    PT Time Calculation (min) 42 min    Equipment Utilized During Treatment Gait belt    Activity Tolerance Patient tolerated treatment well    Behavior During Therapy WFL for tasks assessed/performed                Past Medical History:  Diagnosis Date   Acute pansinusitis 08/02/2017   Anxiety    Arthritis    ASD (atrial septal defect)    s/p closure with Amplatzer device 10/05/04 (Dr. Celso Amy, [facility]) 10/05/04   Cancer [facility])    Cataract    Chronic pain    CKD (chronic kidney disease)    Dyspnea    Fatty liver 09/04/2019   GERD (gastroesophageal reflux disease)    Heart murmur    no longer heard   History of hiatal hernia    Hyperlipidemia    Legally blind in right eye, as defined in [place]    Lumbar herniated disc    Migraines    On home oxygen therapy 06/15/2022   Pt was given O2 on D/C from [facility] 04/2022   OSA on CPAP 06/15/2022   PONV (postoperative nausea and vomiting)    Sciatica    Past Surgical History:  Procedure Laterality Date   ABLATION     BREAST LUMPECTOMY WITH RADIOACTIVE SEED AND SENTINEL LYMPH NODE BIOPSY Right 11/23/2020   Procedure: RIGHT BREAST LUMPECTOMY WITH RADIOACTIVE SEED AND RIGHT AXILLARY SENTINEL LYMPH NODE BIOPSY;  Surgeon: Emelia Loron, MD;  Location: [facility] OR;  Service: General;  Laterality: Right;    BREAST SURGERY Bilateral 2011   Breast Reduction Surgery   BUNIONECTOMY     CARDIAC CATHETERIZATION     10/05/04 [facility]): LM < 25%, otherwise normal coronaries. No pulmonary HTN, Mildly enlarged RV. Secundum ASD s/p closure.   CARDIAC SURGERY     CATARACT EXTRACTION     CLEFT PALATE REPAIR     s/p cleft lip and palate repair   EYE SURGERY Right 2019   right eye removed   LUMBAR LAMINECTOMY/DECOMPRESSION MICRODISCECTOMY Left 05/23/2016   Procedure: LEFT L4-L5 LATERAL RECESS DECOMPRESSION WITH CENTRAL AND RIGHT DECOMPRESSION VIA LEFT SIDE;  Surgeon: Kerrin Champagne, MD;  Location: [facility] OR;  Service: Orthopedics;  Laterality: Left;   LUMBAR LAMINECTOMY/DECOMPRESSION MICRODISCECTOMY Left 05/23/2016   Procedure: LUMBAR LAMINECTOMY/DECOMPRESSION MICRODISCECTOMY Lumbar five - Sacral One 1 LEVEL;  Surgeon: Kerrin Champagne, MD;  Location: [facility] OR;  Service: Orthopedics;  Laterality: Left;   REDUCTION MAMMAPLASTY  2011   SHOULDER INJECTION Left 05/23/2016   Procedure: SHOULDER INJECTION;  Surgeon: Kerrin Champagne, MD;  Location: [facility] OR;  Service: Orthopedics;  Laterality: Left;  band-aid per pa-c   TRANSTHORACIC ECHOCARDIOGRAM     12/15/05 [facility]):  Mild LVH, EF > 55%, grade 1 diastolic dysfunction, Trivial MR/PR/TR.   TUBAL LIGATION     Patient Active Problem List   Diagnosis Date Noted   Chronic obstructive pulmonary disease, unspecified COPD type (HCC) 03/15/2023   Major depressive disorder, single episode, severe without psychotic features (HCC) 03/15/2023   Chronic respiratory failure with hypoxia (HCC) 05/08/2022   Orthostatic hypotension 04/24/2022   Hyperbilirubinemia 04/24/2022   Snoring 07/14/2021   Fatigue 07/14/2021   Daytime somnolence 07/14/2021   Status post device closure of ASD 06/03/2021   Secundum ASD 06/03/2021   Decreased diffusion capacity 05/14/2021   Dyspnea 03/04/2021   Pulmonary nodules 04/01/2020   Primary malignant neoplasm of breast with metastasis (HCC) 02/24/2020    Spondylosis without myelopathy or radiculopathy, lumbosacral region 12/31/2019   Chronic low back pain (Bilateral) w/o sciatica 12/31/2019   Chronic hip pain (3ry area of Pain) (Bilateral) (R>L) 11/27/2019   Chronic sacroiliac joint pain (Left) 11/27/2019   Chronic pain syndrome 11/11/2019   Pharmacologic therapy 11/11/2019   Disorder of skeletal system 11/11/2019   Problems influencing health status 11/11/2019   Abnormal MRI, lumbar spine (08/22/2018) 11/11/2019   Chronic low back pain (1ry area of Pain) (Bilateral) w/ sciatica (Bilateral) 11/11/2019   DDD (degenerative disc disease), lumbosacral 11/11/2019   Grade 1 Anterolisthesis (L2-3, L3-4) 11/11/2019   Grade 1 Retrolisthesis (L4-5, L5-S1) 11/11/2019   Lumbar facet hypertrophy (Multilevel) (Bilateral) 11/11/2019   Lumbar facet syndrome (Multilevel) (Bilateral) (R>L) 11/11/2019   Chronic lower extremity pain (2ry area of Pain) (Bilateral) (R>L) 11/11/2019   Neurogenic pain 11/11/2019   Chronic musculoskeletal pain 11/11/2019   Infiltrating ductal carcinoma of breast (HCC) 10/08/2019   Fatty liver 09/04/2019   Nausea & vomiting 09/04/2019   Obesity (BMI 30-39.9) 09/04/2019   GERD (gastroesophageal reflux disease)    Hyperlipidemia    CKD (chronic kidney disease), stage III (HCC)    Depression    Malignant hypertension    Ocular proptosis 02/14/2019   PVD (posterior vitreous detachment), left eye 02/14/2019   Chorioretinal scar of left eye 02/14/2019   Vitreous floaters of left eye 02/14/2019   Bilateral leg weakness 01/24/2019   Insomnia 08/02/2017   Low back pain radiating to both legs 08/02/2017   Migraine 08/02/2017   Neuropathy 08/02/2017   Spinal stenosis, 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    Class: Chronic   Left shoulder tendonitis 05/23/2016    Class: Acute   Post laminectomy syndrome 05/23/2016   Anxiety disorder 01/07/2015    ONSET DATE: 07/20/2023 (MD referral)  REFERRING DIAG:  M54.16  (ICD-10-CM) - Lumbar back pain with radiculopathy affecting left lower extremity  R26.81 (ICD-10-CM) - Unsteady gait  R29.6 (ICD-10-CM) - Frequent falls    THERAPY DIAG:  Unsteadiness on feet  Other abnormalities of gait and mobility  Muscle weakness (generalized)  Other low back pain  Rationale for Evaluation and Treatment: Rehabilitation  SUBJECTIVE:  SUBJECTIVE STATEMENT: L ankle is better but can't put too much pressure on it for too long. X-ray right after this appointment. Reports a fall a few weeks ago, hurting the R lateral hip, tailbone, and mid-back. Reports mild remaining soreness but denies head trauma. Reports LBP is "touch and go." Reports that is it a "teeny weeny bit better." Reports that she has put in a request to install grab bar in the shower and texture strips on the floor. Pt denies questions on HEP.   Pt accompanied by: self  PERTINENT HISTORY: Per MD:  She has been experiencing worsening back pain following a fall last month during a snowstorm. She slipped on ice and fell, initially without pain, but began experiencing consistent pain about a week later. The pain is primarily located in the lower back, radiating more to the left side, with tenderness in the buttocks and back of the thigh. On February 9th, she experienced severe pain radiating from her left foot up her leg, causing her to fall back into bed. No current pain radiating down the leg, weakness, tingling or numbness. No swelling or bruising in the affected areas. She has a history of back pain and underwent a nerve ablation procedure at the end of January to manage her symptoms.  PMH:  breast cancer with metastasis;  Lumbar laminectomy/decompression microdiscectomy (Left, 05/23/2016); Lumbar laminectomy/decompression  microdiscectomy (Left, 05/23/2016); Shoulder injection (Left, 05/23/2016); Reduction mammaplasty (2011); Eye surgery (Right, 2019); Tubal ligation; and Breast lumpectomy with radioactive seed and sentinel lymph node biopsy (Right, 11/23/2020).   PAIN:  Are you having pain? Yes: NPRS scale: 4/10 Pain location: across LB Pain description: burning Aggravating factors: walking with cane aggravates Relieving factors: sitting, resting *Pt does have hx of chronic low back pain-muscle tightness and soreness*   PRECAUTIONS: Fall; decreased vision Per MD note:  She has issues with her eyes, specifically dry eyes, requiring the use of artificial tears every hour. She has a prosthetic eye (R) and uses lubricant and tapes her left eyelid shut at night to prevent dryness. Her vision can be distorted at times, affecting her daily activities.   RED FLAGS: None   WEIGHT BEARING RESTRICTIONS: No  FALLS: Has patient fallen in last 6 months? Yes. Number of falls at least 3 falls  LIVING ENVIRONMENT: Lives with: lives with their spouse Lives in: House/apartment Stairs:  6 steps, multiple steps -getting ready to move into apartment where there are no steps Has following equipment at home: Single point cane and Walker - 4 wheeled  PLOF: Independent with household mobility with device and Independent with community mobility with device  PATIENT GOALS: Pt's goals for therapy become painfree, more independent, exercise more  OBJECTIVE:    TODAY'S TREATMENT: 08/24/2023 Activity Comments  nustep L5 x 7 min UEs/LEs  Maintaining ~57 SPM  Berg  42/56  Wide tandem stance at counter Cues for core contraction, weaning UE support                [facility] PT Assessment - 08/24/23 0001       Standardized Balance Assessment   Standardized Balance Assessment Berg Balance Test      Berg Balance Test   Sit to Stand Able to stand without using hands and stabilize independently    Standing Unsupported Able to  stand safely 2 minutes    Sitting with Back Unsupported but Feet Supported on Floor or Stool Able to sit safely and securely 2 minutes    Stand to Sit Sits  safely with minimal use of hands    Transfers Able to transfer safely, minor use of hands    Standing Unsupported with Eyes Closed Able to stand 10 seconds safely    Standing Unsupported with Feet Together Able to place feet together independently and stand 1 minute safely    From Standing, Reach Forward with Outstretched Arm Can reach forward >12 cm safely (5")    From Standing Position, Pick up Object from Floor Able to pick up shoe, needs supervision    From Standing Position, Turn to Look Behind Over each Shoulder Looks behind from both sides and weight shifts well    Turn 360 Degrees Able to turn 360 degrees safely in 4 seconds or less    Standing Unsupported, Alternately Place Feet on Step/Stool Needs assistance to keep from falling or unable to try   8 reps with 1 UE support   Standing Unsupported, One Foot in Front Loses balance while stepping or standing    Standing on One Leg Unable to try or needs assist to prevent fall    Total Score 42             PATIENT EDUCATION: Education details: edu on test findings, answered pt's questions on walker height, fall prevention edu, POC, HEP with edu for safety Person educated: Patient Education method: Explanation, Demonstration, Tactile cues, Verbal cues, and Handouts Education comprehension: verbalized understanding and returned demonstration   HOME EXERCISE PROGRAM Access Code: [email or web address] URL: [email or web address] Date: 08/24/2023 Prepared by: [facility] - Outpatient  Rehab - [facility]  Exercises - Supine Posterior Pelvic Tilt  - 1 x daily - 7 x weekly - 1-2 sets - 10 reps - Supine Hamstring Stretch with Strap  - 1 x daily - 5 x weekly - 2 sets - 30 sec hold - Supine Lower Trunk Rotation  - 1 x daily - 5 x weekly - 2 sets - 20 reps - Seated Anterior  Pelvic Tilt  - 1 x daily - 7 x weekly - 1-2 sets - 10 reps - Wide Tandem Stance with Eyes Open  - 1 x daily - 5 x weekly - 2 sets - 30 sec hold    ---------------------------------------------  Note: Objective measures were completed at Evaluation unless otherwise noted.  DIAGNOSTIC FINDINGS: x-rays to be completed 07/26/2023  COGNITION: Overall cognitive status: Within functional limits for tasks assessed   SENSATION: No reports of numbness   POSTURE: rounded shoulders, forward head, and posterior pelvic tilt  LOWER EXTREMITY ROM:     Active  Right Eval Left Eval  Hip flexion    Hip extension    Hip abduction    Hip adduction    Hip internal rotation    Hip external rotation    Knee flexion    Knee extension    Ankle dorsiflexion 5 5  Ankle plantarflexion    Ankle inversion    Ankle eversion     (Blank rows = not tested)  LOWER EXTREMITY MMT:    MMT Right Eval Left Eval  Hip flexion 4+ 4+  Hip extension    Hip abduction 4 4  Hip adduction 4 4  Hip internal rotation    Hip external rotation    Knee flexion 4 4  Knee extension 4+ 4+  Ankle dorsiflexion 4 4  Ankle plantarflexion    Ankle inversion    Ankle eversion    (Blank rows = not tested)  LUMBAR: Palpation:  Tender to palpation along paraspinals thoracic  spine, SI joint area, bilateral buttocks/gluts/piriformis; pt tender to palpation along IT band R>L Baseline pain in standing:  0/10 Repeated standing flexion: 3/10 pain, into bilat buttocks Repeated standing extension:1-2/10 pain centralized at mid- low back Limited in lumbar extension flexibility  Hamstring flexibility from supine 90/90:  R  -15 degrees; L -20 degrees SLR test from supine:  R 85 degrees, L 80 degrees  TRANSFERS: Assistive device utilized: None BUE support Sit to stand: Modified independence Stand to sit: Modified independence   GAIT: Gait pattern: step through pattern, antalgic, lateral lean- Right, and lateral lean-  Left Distance walked: 40-50 ft Assistive device utilized: Single point cane Level of assistance: Modified independence Comments: Has rollator at home, reports less pain with gait when using rollator   PATIENT SURVEYS:  Modified Oswestry (Back Index):  48                                                                                                                               TREATMENT DATE: 07/26/2023    PATIENT EDUCATION: Education details: Eval results, POC; Educated pt on posture/positioning with use of lumbar roll with sititng; discussed proper positioning/height of rollator; discussed pain management with prioritizing movements that centralize symptoms and lessen movements that cause symptom radiation.  Initiated HEP-see below Person educated: Patient Education method: Explanation, Demonstration, and Handouts Education comprehension: verbalized understanding, returned demonstration, and needs further education  HOME EXERCISE PROGRAM: Access Code: [email or web address] URL: [email or web address] Date: 07/26/2023 Prepared by: [facility] - Outpatient  Rehab - [facility]  Exercises - Supine Posterior Pelvic Tilt  - 1 x daily - 7 x weekly - 1-2 sets - 10 reps - Seated Anterior Pelvic Tilt  - 1 x daily - 7 x weekly - 1-2 sets - 10 reps  GOALS: Goals reviewed with patient? Yes  SHORT TERM GOALS: Target date: 08/25/2023  Pt will be independent with HEP for improved strength, flexibility, balance, gait. Baseline: Pt denies questions on HEP 08/23/23 Goal status: MET  08/23/23  2.  Pt will report low back pain reduced by 50% during functional daily activities. Baseline:  Reports LBP is "touch and go." Reports that is it a "teeny weeny bit better" 08/24/23  Goal status: IN PROGRESS 08/24/23   3. Patient to score at least 46/56 on Berg in order to decrease risk of falls.  Baseline: 42 08/24/23 Goal status:IN PROGRESS 08/24/23  4.  Pt will verbalize understanding of fall  prevention and posture/body mechanics for decreased pain/decreased fall risk.  Baseline: provided today 08/23/23  Goal status: MET 08/23/23   LONG TERM GOALS: Target date: 09/22/2023  Pt will be independent with HEP for improved strength, flexibility, pain, balance, gait. Baseline:  Goal status: IN PROGRESS  2.  Back index score to decrease to </= 40 for improved back pain and decreased disability. Baseline:  Goal status: IN PROGRESS  3.  Berg Score to improve by 10 points from baseline to demo decreased  fall risk. Baseline:  Goal status:IN PROGRESS  4.  Pt will verbalize plans for continued community fitness upon d/c from PT to maximize gains in PT. Baseline:  Goal status: IN PROGRESS  ASSESSMENT:  CLINICAL IMPRESSION: Patient arrived to session with 1OX. Since last session, she was seen by PCP and podiatry for L ankle pain and swelling. Was encouraged to elevate, reduce salt intake, and an x-ray was ordered. She reports slight improvement in L ankle pain but admits to a fall a few weeks ago with some mild remaining R lateral hip soreness. STGs check revealed minimal report of improvement in LBP. Patient scored 42/56, indicating an increased risk of falls. Most difficulty was evident with SLS tasks. Provided information on fall prevention in the home and answered all questions. HEP was updated to introduce balance training safely at home. Patient reported understanding and without complaints upon leaving.    OBJECTIVE IMPAIRMENTS: Abnormal gait, decreased balance, decreased mobility, difficulty walking, decreased ROM, decreased strength, impaired flexibility, postural dysfunction, and pain.   ACTIVITY LIMITATIONS: lifting, sitting, standing, stairs, transfers, and locomotion level  PARTICIPATION LIMITATIONS: community activity and exercise  PERSONAL FACTORS: 3+ comorbidities: see above  are also affecting patient's functional outcome.   REHAB POTENTIAL: Good  CLINICAL DECISION  MAKING: Evolving/moderate complexity  EVALUATION COMPLEXITY: Moderate  PLAN:  PT FREQUENCY: 2x/week  PT DURATION: 8 weeks plus eval visit  PLANNED INTERVENTIONS: 97110-Therapeutic exercises, 97530- Therapeutic activity, [id number]- Neuromuscular re-education, 97535- Self Care, 09604- Manual therapy, [id number]- Gait training, [phone]- Aquatic Therapy, [phone]- Electrical stimulation (unattended), 97035- Ultrasound, Patient/Family education, Balance training, DME instructions, and Moist heat  PLAN FOR NEXT SESSION:  Continue to build HEP to promote low back flexibility, hamstring and gastroc flexibility, decreased pain in low back and buttocks (standing and seated extension seem to centralize pain)   *HOLD on Aquatics for now*.  Will need to give aquatic information (she will do this after several weeks in the clinic)  AQUATICS: Frequency: (after 3 weeks in clinic), pt to transition to 1x/wk aquatics and 1x/wk in clinic Duration: 3-5 weeks Special Instruction: flexibility, strength, balance work; pt is interested in aquatic exercise and may benefit from HEP for aquatic therapy and discussion on where to do further aquatic exercise     Baldemar Friday, PT, DPT 08/24/23 10:17 AM  [facility] at [facility] [address] [place], [place] [address] Phone # [phone] Fax # [phone]

## 2023-08-24 ENCOUNTER — Ambulatory Visit: Payer: Medicare Other | Admitting: Physical Therapy

## 2023-08-24 ENCOUNTER — Ambulatory Visit
Admission: RE | Admit: 2023-08-24 | Discharge: 2023-08-24 | Disposition: A | Discharge status: Home / Self Care | Source: Ambulatory Visit | Attending: Family | Admitting: Family

## 2023-08-24 ENCOUNTER — Encounter: Payer: Self-pay | Admitting: Physical Therapy

## 2023-08-24 DIAGNOSIS — M25472 Effusion, left ankle: Secondary | ICD-10-CM

## 2023-08-24 DIAGNOSIS — R2681 Unsteadiness on feet: Secondary | ICD-10-CM

## 2023-08-24 DIAGNOSIS — M5459 Other low back pain: Secondary | ICD-10-CM

## 2023-08-24 DIAGNOSIS — M25572 Pain in left ankle and joints of left foot: Secondary | ICD-10-CM | POA: Diagnosis not present

## 2023-08-24 DIAGNOSIS — R2689 Other abnormalities of gait and mobility: Secondary | ICD-10-CM

## 2023-08-24 DIAGNOSIS — M6281 Muscle weakness (generalized): Secondary | ICD-10-CM

## 2023-08-24 DIAGNOSIS — R293 Abnormal posture: Secondary | ICD-10-CM | POA: Diagnosis not present

## 2023-08-24 NOTE — Patient Instructions (Signed)

## 2023-08-25 ENCOUNTER — Telehealth: Payer: Self-pay | Admitting: Hematology

## 2023-08-25 NOTE — Telephone Encounter (Signed)
 Patient is aware of scheduled appointment times/dates; patient has requested a calendar and note has been added for when she arrives for their appointment

## 2023-08-28 ENCOUNTER — Inpatient Hospital Stay

## 2023-08-28 VITALS — BP 141/65 | HR 94 | Temp 97.6°F | Resp 18

## 2023-08-28 DIAGNOSIS — C50511 Malignant neoplasm of lower-outer quadrant of right female breast: Secondary | ICD-10-CM | POA: Diagnosis not present

## 2023-08-28 DIAGNOSIS — Z79818 Long term (current) use of other agents affecting estrogen receptors and estrogen levels: Secondary | ICD-10-CM | POA: Diagnosis not present

## 2023-08-28 DIAGNOSIS — C50919 Malignant neoplasm of unspecified site of unspecified female breast: Secondary | ICD-10-CM

## 2023-08-28 DIAGNOSIS — Z5111 Encounter for antineoplastic chemotherapy: Secondary | ICD-10-CM | POA: Diagnosis not present

## 2023-08-28 DIAGNOSIS — Z79899 Other long term (current) drug therapy: Secondary | ICD-10-CM | POA: Diagnosis not present

## 2023-08-28 DIAGNOSIS — E876 Hypokalemia: Secondary | ICD-10-CM | POA: Diagnosis not present

## 2023-08-28 DIAGNOSIS — Z87891 Personal history of nicotine dependence: Secondary | ICD-10-CM | POA: Diagnosis not present

## 2023-08-28 DIAGNOSIS — Z170 Estrogen receptor positive status [ER+]: Secondary | ICD-10-CM | POA: Diagnosis not present

## 2023-08-28 DIAGNOSIS — C7800 Secondary malignant neoplasm of unspecified lung: Secondary | ICD-10-CM | POA: Diagnosis not present

## 2023-08-28 LAB — CMP (CANCER CENTER ONLY)
ALT: 88 U/L — ABNORMAL HIGH (ref 0–44)
AST: 90 U/L — ABNORMAL HIGH (ref 15–41)
Albumin: 3.6 g/dL (ref 3.5–5.0)
Alkaline Phosphatase: 272 U/L — ABNORMAL HIGH (ref 38–126)
Anion gap: 7 (ref 5–15)
BUN: 11 mg/dL (ref 6–20)
CO2: 31 mmol/L (ref 22–32)
Calcium: 9.4 mg/dL (ref 8.9–10.3)
Chloride: 105 mmol/L (ref 98–111)
Creatinine: 1.19 mg/dL — ABNORMAL HIGH (ref 0.44–1.00)
GFR, Estimated: 53 mL/min — ABNORMAL LOW (ref >60)
Glucose, Bld: 104 mg/dL — ABNORMAL HIGH (ref 70–99)
Potassium: 3.8 mmol/L (ref 3.5–5.1)
Sodium: 143 mmol/L (ref 135–145)
Total Bilirubin: 0.5 mg/dL (ref 0.0–1.2)
Total Protein: 7 g/dL (ref 6.5–8.1)

## 2023-08-28 LAB — CBC WITH DIFFERENTIAL (CANCER CENTER ONLY)
Abs Immature Granulocytes: 0 10*3/uL (ref 0.00–0.07)
Basophils Absolute: 0 10*3/uL (ref 0.0–0.1)
Basophils Relative: 1 %
Eosinophils Absolute: 0 10*3/uL (ref 0.0–0.5)
Eosinophils Relative: 1 %
HCT: 37.2 % (ref 36.0–46.0)
Hemoglobin: 12.3 g/dL (ref 12.0–15.0)
Immature Granulocytes: 0 %
Lymphocytes Relative: 58 %
Lymphs Abs: 1.8 10*3/uL (ref 0.7–4.0)
MCH: 35.5 pg — ABNORMAL HIGH (ref 26.0–34.0)
MCHC: 33.1 g/dL (ref 30.0–36.0)
MCV: 107.5 fL — ABNORMAL HIGH (ref 80.0–100.0)
Monocytes Absolute: 0.2 10*3/uL (ref 0.1–1.0)
Monocytes Relative: 6 %
Neutro Abs: 1.1 10*3/uL — ABNORMAL LOW (ref 1.7–7.7)
Neutrophils Relative %: 34 %
Platelet Count: 198 10*3/uL (ref 150–400)
RBC: 3.46 MIL/uL — ABNORMAL LOW (ref 3.87–5.11)
RDW: 13.2 % (ref 11.5–15.5)
WBC Count: 3.1 10*3/uL — ABNORMAL LOW (ref 4.0–10.5)
nRBC: 0 % (ref 0.0–0.2)

## 2023-08-28 MED ORDER — FAMOTIDINE 20 MG PO TABS
20.0000 mg | ORAL_TABLET | Freq: Once | ORAL | Status: AC
  Administered 2023-08-28: 20 mg via ORAL
  Filled 2023-08-28: qty 1

## 2023-08-28 MED ORDER — FULVESTRANT 250 MG/5ML IM SOSY
500.0000 mg | PREFILLED_SYRINGE | Freq: Once | INTRAMUSCULAR | Status: AC
  Administered 2023-08-28: 500 mg via INTRAMUSCULAR
  Filled 2023-08-28: qty 10

## 2023-08-28 MED ORDER — IBUPROFEN 200 MG PO TABS
400.0000 mg | ORAL_TABLET | Freq: Once | ORAL | Status: AC
  Administered 2023-08-28: 400 mg via ORAL
  Filled 2023-08-28: qty 2

## 2023-08-28 MED ORDER — DIPHENHYDRAMINE HCL 25 MG PO CAPS
25.0000 mg | ORAL_CAPSULE | Freq: Once | ORAL | Status: AC
  Administered 2023-08-28: 25 mg via ORAL
  Filled 2023-08-28: qty 1

## 2023-08-28 NOTE — Progress Notes (Signed)
  Subjective:  Patient ID: Brandy [name], female    DOB: 1966-02-18,  MRN: [id number]  58 y.o. female presents painful elongated mycotic toenails 1-5 bilaterally which are tender when wearing enclosed shoe gear. Pain is relieved with periodic professional debridement.  Chief Complaint  Patient presents with   Nail Problem    Pt is here for [facility] PCP is Dr Elam Dutch and LOV was earlier this month.   New problem(s): None   PCP is Ngetich, Dinah C, NP.  Allergies  Allergen Reactions   Zithromax [Azithromycin] Shortness Of Breath, Itching and Other (See Comments)    TOTAL BODY ITCHING [EVEN SOLES OF FEET] WHEEZING    Tramadol Itching and Other (See Comments)    Has taken recently without any side effects.   Psyllium Nausea And Vomiting and Other (See Comments)    Metamucil. Sneezing      Review of Systems: Negative except as noted in the HPI.   Objective:  Brandy [name] is a pleasant 58 y.o. female in NAD. AAO x 3.  Vascular Examination: Vascular status intact b/l with palpable pedal pulses. CFT immediate b/l. Pedal hair present. No edema. No pain with calf compression b/l. Skin temperature gradient WNL b/l. No varicosities noted. No cyanosis or clubbing noted.  Neurological Examination: Sensation grossly intact b/l with 10 gram monofilament. Vibratory sensation intact b/l.  Dermatological Examination: Pedal skin with normal turgor, texture and tone b/l. No open wounds nor interdigital macerations noted. Toenails 1-5 b/l thick, discolored, elongated with subungual debris and pain on dorsal palpation. Hyperkeratotic lesion(s) bilateral heels.  No erythema, no edema, no drainage, no fluctuance.  Musculoskeletal Examination: Muscle strength 5/5 to b/l LE.  No pain, crepitus noted b/l. No gross pedal deformities. Utilizes rollator for ambulation assistance.  Radiographs: None  Last A1c:       No data to display         Assessment:   1. Pain due  to onychomycosis of toenails of both feet    Plan:  -Patient was evaluated today. All questions/concerns addressed on today's visit. -Continue supportive shoe gear daily. -Toenails 1-5 b/l were debrided in length and girth with sterile nail nippers and dremel without iatrogenic bleeding.  -As a courtesy, callus(es) bilateral heels gently filed without complication or incident. Total number pared=2. -For dry skin, recommended daily use of Bag Balm Hand and Body Moistuizer which may be purchased at local drug store or on [employer]. -Recommended Revitaderm Cream for calluses of heels. -Patient/POA to call should there be question/concern in the interim.  Return in about 3 months (around 11/22/2023).  Freddie Breech, DPM      [place] LOCATION: [address] [place] [address]                   Office [phone]   [facility] LOCATION: [address] [place], [place] [address] Office [phone]

## 2023-08-28 NOTE — Therapy (Signed)
 OUTPATIENT PHYSICAL THERAPY NEURO TREATMENT   Patient Name: Brandy [name] MRN: [id number] DOB:Apr 29, 1966, 58 y.o., female Today's Date: 08/29/2023   PCP: Caesar Bookman, NP  REFERRING PROVIDER: Caesar Bookman, NP   END OF SESSION:  PT End of Session - 08/29/23 0926     Visit Number 5    Number of Visits 17    Date for PT Re-Evaluation 09/22/23    Authorization Type [employer] Medicare    Authorization Time Period approved 17 PT visits from 07/26/2023-09/20/2023    Authorization - Visit Number 5    Authorization - Number of Visits 17    Progress Note Due on Visit 10    PT Start Time 0843    PT Stop Time 0926    PT Time Calculation (min) 43 min    Equipment Utilized During Treatment Gait belt    Activity Tolerance Patient tolerated treatment well    Behavior During Therapy WFL for tasks assessed/performed                 Past Medical History:  Diagnosis Date   Acute pansinusitis 08/02/2017   Anxiety    Arthritis    ASD (atrial septal defect)    s/p closure with Amplatzer device 10/05/04 (Dr. Celso Amy, [facility]) 10/05/04   Cancer [facility])    Cataract    Chronic pain    CKD (chronic kidney disease)    Dyspnea    Fatty liver 09/04/2019   GERD (gastroesophageal reflux disease)    Heart murmur    no longer heard   History of hiatal hernia    Hyperlipidemia    Legally blind in right eye, as defined in [place]    Lumbar herniated disc    Migraines    On home oxygen therapy 06/15/2022   Pt was given O2 on D/C from [facility] 04/2022   OSA on CPAP 06/15/2022   PONV (postoperative nausea and vomiting)    Sciatica    Past Surgical History:  Procedure Laterality Date   ABLATION     BREAST LUMPECTOMY WITH RADIOACTIVE SEED AND SENTINEL LYMPH NODE BIOPSY Right 11/23/2020   Procedure: RIGHT BREAST LUMPECTOMY WITH RADIOACTIVE SEED AND RIGHT AXILLARY SENTINEL LYMPH NODE BIOPSY;  Surgeon: Emelia Loron, MD;  Location: [facility] OR;  Service: General;  Laterality: Right;    BREAST SURGERY Bilateral 2011   Breast Reduction Surgery   BUNIONECTOMY     CARDIAC CATHETERIZATION     10/05/04 [facility]): LM < 25%, otherwise normal coronaries. No pulmonary HTN, Mildly enlarged RV. Secundum ASD s/p closure.   CARDIAC SURGERY     CATARACT EXTRACTION     CLEFT PALATE REPAIR     s/p cleft lip and palate repair   EYE SURGERY Right 2019   right eye removed   LUMBAR LAMINECTOMY/DECOMPRESSION MICRODISCECTOMY Left 05/23/2016   Procedure: LEFT L4-L5 LATERAL RECESS DECOMPRESSION WITH CENTRAL AND RIGHT DECOMPRESSION VIA LEFT SIDE;  Surgeon: Kerrin Champagne, MD;  Location: [facility] OR;  Service: Orthopedics;  Laterality: Left;   LUMBAR LAMINECTOMY/DECOMPRESSION MICRODISCECTOMY Left 05/23/2016   Procedure: LUMBAR LAMINECTOMY/DECOMPRESSION MICRODISCECTOMY Lumbar five - Sacral One 1 LEVEL;  Surgeon: Kerrin Champagne, MD;  Location: [facility] OR;  Service: Orthopedics;  Laterality: Left;   REDUCTION MAMMAPLASTY  2011   SHOULDER INJECTION Left 05/23/2016   Procedure: SHOULDER INJECTION;  Surgeon: Kerrin Champagne, MD;  Location: [facility] OR;  Service: Orthopedics;  Laterality: Left;  band-aid per pa-c   TRANSTHORACIC ECHOCARDIOGRAM     12/15/05 (  [facility]): Mild LVH, EF > 55%, grade 1 diastolic dysfunction, Trivial MR/PR/TR.   TUBAL LIGATION     Patient Active Problem List   Diagnosis Date Noted   Chronic obstructive pulmonary disease, unspecified COPD type (HCC) 03/15/2023   Major depressive disorder, single episode, severe without psychotic features (HCC) 03/15/2023   Chronic respiratory failure with hypoxia (HCC) 05/08/2022   Orthostatic hypotension 04/24/2022   Hyperbilirubinemia 04/24/2022   Snoring 07/14/2021   Fatigue 07/14/2021   Daytime somnolence 07/14/2021   Status post device closure of ASD 06/03/2021   Secundum ASD 06/03/2021   Decreased diffusion capacity 05/14/2021   Dyspnea 03/04/2021   Pulmonary nodules 04/01/2020   Primary malignant neoplasm of breast with metastasis (HCC) 02/24/2020    Spondylosis without myelopathy or radiculopathy, lumbosacral region 12/31/2019   Chronic low back pain (Bilateral) w/o sciatica 12/31/2019   Chronic hip pain (3ry area of Pain) (Bilateral) (R>L) 11/27/2019   Chronic sacroiliac joint pain (Left) 11/27/2019   Chronic pain syndrome 11/11/2019   Pharmacologic therapy 11/11/2019   Disorder of skeletal system 11/11/2019   Problems influencing health status 11/11/2019   Abnormal MRI, lumbar spine (08/22/2018) 11/11/2019   Chronic low back pain (1ry area of Pain) (Bilateral) w/ sciatica (Bilateral) 11/11/2019   DDD (degenerative disc disease), lumbosacral 11/11/2019   Grade 1 Anterolisthesis (L2-3, L3-4) 11/11/2019   Grade 1 Retrolisthesis (L4-5, L5-S1) 11/11/2019   Lumbar facet hypertrophy (Multilevel) (Bilateral) 11/11/2019   Lumbar facet syndrome (Multilevel) (Bilateral) (R>L) 11/11/2019   Chronic lower extremity pain (2ry area of Pain) (Bilateral) (R>L) 11/11/2019   Neurogenic pain 11/11/2019   Chronic musculoskeletal pain 11/11/2019   Infiltrating ductal carcinoma of breast (HCC) 10/08/2019   Fatty liver 09/04/2019   Nausea & vomiting 09/04/2019   Obesity (BMI 30-39.9) 09/04/2019   GERD (gastroesophageal reflux disease)    Hyperlipidemia    CKD (chronic kidney disease), stage III (HCC)    Depression    Malignant hypertension    Ocular proptosis 02/14/2019   PVD (posterior vitreous detachment), left eye 02/14/2019   Chorioretinal scar of left eye 02/14/2019   Vitreous floaters of left eye 02/14/2019   Bilateral leg weakness 01/24/2019   Insomnia 08/02/2017   Low back pain radiating to both legs 08/02/2017   Migraine 08/02/2017   Neuropathy 08/02/2017   Spinal stenosis, 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    Class: Chronic   Left shoulder tendonitis 05/23/2016    Class: Acute   Post laminectomy syndrome 05/23/2016   Anxiety disorder 01/07/2015    ONSET DATE: 07/20/2023 (MD referral)  REFERRING DIAG:  M54.16  (ICD-10-CM) - Lumbar back pain with radiculopathy affecting left lower extremity  R26.81 (ICD-10-CM) - Unsteady gait  R29.6 (ICD-10-CM) - Frequent falls    THERAPY DIAG:  Unsteadiness on feet  Other abnormalities of gait and mobility  Muscle weakness (generalized)  Other low back pain  Abnormal posture  Rationale for Evaluation and Treatment: Rehabilitation  SUBJECTIVE:  SUBJECTIVE STATEMENT: (Back) is still sore but it's okay. L ankle is still hurting.   Pt accompanied by: self  PERTINENT HISTORY: Per MD:  She has been experiencing worsening back pain following a fall last month during a snowstorm. She slipped on ice and fell, initially without pain, but began experiencing consistent pain about a week later. The pain is primarily located in the lower back, radiating more to the left side, with tenderness in the buttocks and back of the thigh. On February 9th, she experienced severe pain radiating from her left foot up her leg, causing her to fall back into bed. No current pain radiating down the leg, weakness, tingling or numbness. No swelling or bruising in the affected areas. She has a history of back pain and underwent a nerve ablation procedure at the end of January to manage her symptoms.  PMH:  breast cancer with metastasis;  Lumbar laminectomy/decompression microdiscectomy (Left, 05/23/2016); Lumbar laminectomy/decompression microdiscectomy (Left, 05/23/2016); Shoulder injection (Left, 05/23/2016); Reduction mammaplasty (2011); Eye surgery (Right, 2019); Tubal ligation; and Breast lumpectomy with radioactive seed and sentinel lymph node biopsy (Right, 11/23/2020).   PAIN:  Are you having pain? Yes: NPRS scale: "zero"/10 Pain location: across LB Pain description: burning Aggravating factors:  walking with cane aggravates Relieving factors: sitting, resting *Pt does have hx of chronic low back pain-muscle tightness and soreness*  Are you having pain? Yes: NPRS scale: "when I sit it's zero, but when I start to move it increases"/10 Pain location: L ankle Pain description: sore Aggravating factors: standing, walking Relieving factors: ankle sleeve    PRECAUTIONS: Fall; decreased vision Per MD note:  She has issues with her eyes, specifically dry eyes, requiring the use of artificial tears every hour. She has a prosthetic eye (R) and uses lubricant and tapes her left eyelid shut at night to prevent dryness. Her vision can be distorted at times, affecting her daily activities.   RED FLAGS: None   WEIGHT BEARING RESTRICTIONS: No  FALLS: Has patient fallen in last 6 months? Yes. Number of falls at least 3 falls  LIVING ENVIRONMENT: Lives with: lives with their spouse Lives in: House/apartment Stairs:  6 steps, multiple steps -getting ready to move into apartment where there are no steps Has following equipment at home: Single point cane and Walker - 4 wheeled  PLOF: Independent with household mobility with device and Independent with community mobility with device  PATIENT GOALS: Pt's goals for therapy become painfree, more independent, exercise more  OBJECTIVE:     TODAY'S TREATMENT: 08/29/23 Activity Comments  nustep L5 x 6 min UEs/LEs  Maintaining ~58SPM  wide tandem stance 2x30" each  Good focus on posture and improved stability; able to perform without support during static and dynamic components of exercise   alt toe tap on edge of TM Able to wean UE support; cueing to occasionally look down at foot for improved safety awareness   1 foot on edge of TM + alt UE raise CGA; good stability   Sitting green TB row 2x15 While sitting on MHP d/t L buttock/thigh pain Cueing for posture and proper alignment      HOME EXERCISE PROGRAM Access Code: [email or web address] URL:  [email or web address] Date: 08/29/2023 Prepared by: [facility] - Outpatient  Rehab - [facility]  Exercises - Supine Posterior Pelvic Tilt  - 1 x daily - 7 x weekly - 1-2 sets - 10 reps - Supine Hamstring Stretch with Strap  - 1 x daily - 5 x weekly - 2  sets - 30 sec hold - Supine Lower Trunk Rotation  - 1 x daily - 5 x weekly - 2 sets - 20 reps - Seated Anterior Pelvic Tilt  - 1 x daily - 7 x weekly - 1-2 sets - 10 reps - Wide Tandem Stance with Eyes Open  - 1 x daily - 5 x weekly - 2 sets - 30 sec hold - Standing Toe Taps  - 1 x daily - 5 x weekly - 2 sets - 10 reps - Standing Shoulder Row with Anchored Resistance  - 1 x daily - 5 x weekly - 2 sets - 10 reps     PATIENT EDUCATION: Education details: HEP update with edu for safety Person educated: Patient Education method: Explanation, Demonstration, Tactile cues, Verbal cues, and Handouts Education comprehension: verbalized understanding and returned demonstration     ---------------------------------------------  Note: Objective measures were completed at Evaluation unless otherwise noted.  DIAGNOSTIC FINDINGS: x-rays to be completed 07/26/2023  COGNITION: Overall cognitive status: Within functional limits for tasks assessed   SENSATION: No reports of numbness   POSTURE: rounded shoulders, forward head, and posterior pelvic tilt  LOWER EXTREMITY ROM:     Active  Right Eval Left Eval  Hip flexion    Hip extension    Hip abduction    Hip adduction    Hip internal rotation    Hip external rotation    Knee flexion    Knee extension    Ankle dorsiflexion 5 5  Ankle plantarflexion    Ankle inversion    Ankle eversion     (Blank rows = not tested)  LOWER EXTREMITY MMT:    MMT Right Eval Left Eval  Hip flexion 4+ 4+  Hip extension    Hip abduction 4 4  Hip adduction 4 4  Hip internal rotation    Hip external rotation    Knee flexion 4 4  Knee extension 4+ 4+  Ankle dorsiflexion 4 4   Ankle plantarflexion    Ankle inversion    Ankle eversion    (Blank rows = not tested)  LUMBAR: Palpation:  Tender to palpation along paraspinals thoracic spine, SI joint area, bilateral buttocks/gluts/piriformis; pt tender to palpation along IT band R>L Baseline pain in standing:  0/10 Repeated standing flexion: 3/10 pain, into bilat buttocks Repeated standing extension:1-2/10 pain centralized at mid- low back Limited in lumbar extension flexibility  Hamstring flexibility from supine 90/90:  R  -15 degrees; L -20 degrees SLR test from supine:  R 85 degrees, L 80 degrees  TRANSFERS: Assistive device utilized: None BUE support Sit to stand: Modified independence Stand to sit: Modified independence   GAIT: Gait pattern: step through pattern, antalgic, lateral lean- Right, and lateral lean- Left Distance walked: 40-50 ft Assistive device utilized: Single point cane Level of assistance: Modified independence Comments: Has rollator at home, reports less pain with gait when using rollator   PATIENT SURVEYS:  Modified Oswestry (Back Index):  48  TREATMENT DATE: 07/26/2023    PATIENT EDUCATION: Education details: Eval results, POC; Educated pt on posture/positioning with use of lumbar roll with sititng; discussed proper positioning/height of rollator; discussed pain management with prioritizing movements that centralize symptoms and lessen movements that cause symptom radiation.  Initiated HEP-see below Person educated: Patient Education method: Explanation, Demonstration, and Handouts Education comprehension: verbalized understanding, returned demonstration, and needs further education  HOME EXERCISE PROGRAM: Access Code: [email or web address] URL: [email or web address] Date: 07/26/2023 Prepared by: [facility] - Outpatient  Rehab - [facility]  Exercises - Supine Posterior Pelvic Tilt  - 1 x daily - 7 x weekly - 1-2 sets - 10 reps - Seated Anterior Pelvic Tilt  - 1 x daily - 7 x weekly - 1-2 sets - 10 reps  GOALS: Goals reviewed with patient? Yes  SHORT TERM GOALS: Target date: 08/25/2023  Pt will be independent with HEP for improved strength, flexibility, balance, gait. Baseline: Pt denies questions on HEP 08/23/23 Goal status: MET  08/23/23  2.  Pt will report low back pain reduced by 50% during functional daily activities. Baseline:  Reports LBP is "touch and go." Reports that is it a "teeny weeny bit better" 08/24/23  Goal status: IN PROGRESS 08/24/23   3. Patient to score at least 46/56 on Berg in order to decrease risk of falls.  Baseline: 42 08/24/23 Goal status:IN PROGRESS 08/24/23  4.  Pt will verbalize understanding of fall prevention and posture/body mechanics for decreased pain/decreased fall risk.  Baseline: provided today 08/23/23  Goal status: MET 08/23/23   LONG TERM GOALS: Target date: 09/22/2023  Pt will be independent with HEP for improved strength, flexibility, pain, balance, gait. Baseline:  Goal status: IN PROGRESS  2.  Back index score to decrease to </= 40 for improved back pain and decreased disability. Baseline:  Goal status: IN PROGRESS  3.  Berg Score to improve by 10 points from baseline to demo decreased fall risk. Baseline:  Goal status:IN PROGRESS  4.  Pt will verbalize plans for continued community fitness upon d/c from PT to maximize gains in PT. Baseline:  Goal status: IN PROGRESS  ASSESSMENT:  CLINICAL IMPRESSION: Patient arrived to session with report of remaining L ankle soreness. X-ray results still pending.  Reviewed HEP update and continued working on dynamic balance activities. Patient reported occasional L ankle pain in standing but requested to proceed with activities. Good improvement in stability evident with balance activities today. Remainder of session focused  on seated postural strengthening  to assist with taller posture during gait. This was performed in sitting with MHP to L buttock d/t c/o L ankle and buttock pain after several standing activities. Patient reported some R UT rightness at end of session, no other complaints.    OBJECTIVE IMPAIRMENTS: Abnormal gait, decreased balance, decreased mobility, difficulty walking, decreased ROM, decreased strength, impaired flexibility, postural dysfunction, and pain.   ACTIVITY LIMITATIONS: lifting, sitting, standing, stairs, transfers, and locomotion level  PARTICIPATION LIMITATIONS: community activity and exercise  PERSONAL FACTORS: 3+ comorbidities: see above  are also affecting patient's functional outcome.   REHAB POTENTIAL: Good  CLINICAL DECISION MAKING: Evolving/moderate complexity  EVALUATION COMPLEXITY: Moderate  PLAN:  PT FREQUENCY: 2x/week  PT DURATION: 8 weeks plus eval visit  PLANNED INTERVENTIONS: 97110-Therapeutic exercises, 97530- Therapeutic activity, [id number]- Neuromuscular re-education, 97535- Self Care, [id number]- Manual therapy, [id number]- Gait training, [id number]- Aquatic Therapy, [phone]- Electrical stimulation (unattended), 97035- Ultrasound, Patient/Family education, Balance training, DME instructions, and Moist heat  PLAN FOR NEXT SESSION:  Continue to build HEP to promote low back flexibility, hamstring and gastroc flexibility, decreased pain in low back and buttocks (standing and seated extension seem to centralize pain)   *HOLD on Aquatics for now*.  Will need to give aquatic information (she will do this after several weeks in the clinic)  AQUATICS: Frequency: (after 3 weeks in clinic), pt to transition to 1x/wk aquatics and 1x/wk in clinic Duration: 3-5 weeks Special Instruction: flexibility, strength, balance work; pt is interested in aquatic exercise and may benefit from HEP for aquatic therapy and discussion on where to do further aquatic exercise     Baldemar Friday, PT, DPT 08/29/23 9:27 AM  [facility] at [facility] [address], [address] [place], [place] [address] Phone # [phone] Fax # [phone]

## 2023-08-29 ENCOUNTER — Ambulatory Visit: Payer: Medicare Other | Admitting: Rehabilitation

## 2023-08-29 ENCOUNTER — Encounter: Payer: Self-pay | Admitting: Physical Therapy

## 2023-08-29 ENCOUNTER — Ambulatory Visit: Admitting: Physical Therapy

## 2023-08-29 DIAGNOSIS — R2681 Unsteadiness on feet: Secondary | ICD-10-CM | POA: Diagnosis not present

## 2023-08-29 DIAGNOSIS — R293 Abnormal posture: Secondary | ICD-10-CM | POA: Diagnosis not present

## 2023-08-29 DIAGNOSIS — M6281 Muscle weakness (generalized): Secondary | ICD-10-CM

## 2023-08-29 DIAGNOSIS — M5459 Other low back pain: Secondary | ICD-10-CM | POA: Diagnosis not present

## 2023-08-29 DIAGNOSIS — R2689 Other abnormalities of gait and mobility: Secondary | ICD-10-CM | POA: Diagnosis not present

## 2023-08-31 ENCOUNTER — Ambulatory Visit: Payer: Medicare Other | Admitting: Physical Therapy

## 2023-08-31 ENCOUNTER — Telehealth: Payer: Self-pay | Admitting: Physical Therapy

## 2023-08-31 NOTE — Telephone Encounter (Signed)
 LVM as patient did not show for 8:45 PT appointment today.  Requested pt call back to try to reschedule.  Lonia Blood, PT 08/31/23 9:03 AM Phone: [phone] Fax: [phone]

## 2023-09-04 DIAGNOSIS — H0520 Unspecified exophthalmos: Secondary | ICD-10-CM | POA: Diagnosis not present

## 2023-09-04 DIAGNOSIS — H16212 Exposure keratoconjunctivitis, left eye: Secondary | ICD-10-CM | POA: Diagnosis not present

## 2023-09-05 ENCOUNTER — Encounter: Payer: Self-pay | Admitting: Physical Therapy

## 2023-09-05 ENCOUNTER — Ambulatory Visit: Attending: Family | Admitting: Physical Therapy

## 2023-09-05 ENCOUNTER — Ambulatory Visit: Payer: Medicare Other | Admitting: Rehabilitation

## 2023-09-05 DIAGNOSIS — R262 Difficulty in walking, not elsewhere classified: Secondary | ICD-10-CM | POA: Insufficient documentation

## 2023-09-05 DIAGNOSIS — R2689 Other abnormalities of gait and mobility: Secondary | ICD-10-CM

## 2023-09-05 DIAGNOSIS — R2681 Unsteadiness on feet: Secondary | ICD-10-CM | POA: Diagnosis not present

## 2023-09-05 DIAGNOSIS — R293 Abnormal posture: Secondary | ICD-10-CM | POA: Insufficient documentation

## 2023-09-05 DIAGNOSIS — M6281 Muscle weakness (generalized): Secondary | ICD-10-CM | POA: Diagnosis not present

## 2023-09-05 DIAGNOSIS — M5459 Other low back pain: Secondary | ICD-10-CM | POA: Diagnosis not present

## 2023-09-05 NOTE — Therapy (Signed)
 OUTPATIENT PHYSICAL THERAPY NEURO TREATMENT   Patient Name: Brandy [name] MRN: [id number] DOB:May 20, 1966, 58 y.o., female Today's Date: 09/05/2023   PCP: Caesar Bookman, NP  REFERRING PROVIDER: Caesar Bookman, NP   END OF SESSION:  PT End of Session - 09/05/23 0834     Visit Number 6    Number of Visits 17    Date for PT Re-Evaluation 09/22/23    Authorization Type [employer] Medicare    Authorization Time Period approved 17 PT visits from 07/26/2023-09/20/2023    Authorization - Visit Number 6    Authorization - Number of Visits 17    Progress Note Due on Visit 10    PT Start Time [phone]    Equipment Utilized During Treatment Gait belt    Activity Tolerance Patient tolerated treatment well    Behavior During Therapy [facility] for tasks assessed/performed                  Past Medical History:  Diagnosis Date   Acute pansinusitis 08/02/2017   Anxiety    Arthritis    ASD (atrial septal defect)    s/p closure with Amplatzer device 10/05/04 (Dr. Celso Anselmo Reihl, [facility]) 10/05/04   Cancer [facility])    Cataract    Chronic pain    CKD (chronic kidney disease)    Dyspnea    Fatty liver 09/04/2019   GERD (gastroesophageal reflux disease)    Heart murmur    no longer heard   History of hiatal hernia    Hyperlipidemia    Legally blind in right eye, as defined in [place]    Lumbar herniated disc    Migraines    On home oxygen therapy 06/15/2022   Pt [facility]   OSA on CPAP 06/15/2022   PONV (postoperative nausea and vomiting)    Sciatica    Past Surgical History:  Procedure Laterality Date   ABLATION     BREAST LUMPECTOMY WITH RADIOACTIVE SEED AND SENTINEL LYMPH NODE BIOPSY Right 11/23/2020   Procedure: RIGHT BREAST LUMPECTOMY WITH RADIOACTIVE SEED AND RIGHT AXILLARY SENTINEL LYMPH NODE BIOPSY;  Surgeon: Emelia Loron, MD;  Location: [facility] OR;  Service: General;  Laterality: Right;   BREAST SURGERY Bilateral 2011   Breast Reduction  Surgery   BUNIONECTOMY     CARDIAC CATHETERIZATION     10/05/04 [facility]): LM < 25%, otherwise normal coronaries. No pulmonary HTN, Mildly enlarged RV. Secundum ASD s/p closure.   CARDIAC SURGERY     CATARACT EXTRACTION     CLEFT PALATE REPAIR     s/p cleft lip and palate repair   EYE SURGERY Right 2019   right eye removed   LUMBAR LAMINECTOMY/DECOMPRESSION MICRODISCECTOMY Left 05/23/2016   Procedure: LEFT L4-L5 LATERAL RECESS DECOMPRESSION WITH CENTRAL AND RIGHT DECOMPRESSION VIA LEFT SIDE;  Surgeon: Kerrin Champagne, MD;  Location: [facility] OR;  Service: Orthopedics;  Laterality: Left;   LUMBAR LAMINECTOMY/DECOMPRESSION MICRODISCECTOMY Left 05/23/2016   Procedure: LUMBAR LAMINECTOMY/DECOMPRESSION MICRODISCECTOMY Lumbar five - Sacral One 1 LEVEL;  Surgeon: Kerrin Champagne, MD;  Location: [facility] OR;  Service: Orthopedics;  Laterality: Left;   REDUCTION MAMMAPLASTY  2011   SHOULDER INJECTION Left 05/23/2016   Procedure: SHOULDER INJECTION;  Surgeon: Kerrin Champagne, MD;  Location: [facility] OR;  Service: Orthopedics;  Laterality: Left;  band-aid per pa-c   TRANSTHORACIC ECHOCARDIOGRAM     12/15/05 [facility]): Mild LVH, EF > 55%, grade 1 diastolic dysfunction, Trivial MR/PR/TR.   TUBAL  LIGATION     Patient Active Problem List   Diagnosis Date Noted   Chronic obstructive pulmonary disease, unspecified COPD type (HCC) 03/15/2023   Major depressive disorder, single episode, severe without psychotic features (HCC) 03/15/2023   Chronic respiratory failure with hypoxia (HCC) 05/08/2022   Orthostatic hypotension 04/24/2022   Hyperbilirubinemia 04/24/2022   Snoring 07/14/2021   Fatigue 07/14/2021   Daytime somnolence 07/14/2021   Status post device closure of ASD 06/03/2021   Secundum ASD 06/03/2021   Decreased diffusion capacity 05/14/2021   Dyspnea 03/04/2021   Pulmonary nodules 04/01/2020   Primary malignant neoplasm of breast with metastasis (HCC) 02/24/2020   Spondylosis without myelopathy or radiculopathy,  lumbosacral region 12/31/2019   Chronic low back pain (Bilateral) w/o sciatica 12/31/2019   Chronic hip pain (3ry area of Pain) (Bilateral) (R>L) 11/27/2019   Chronic sacroiliac joint pain (Left) 11/27/2019   Chronic pain syndrome 11/11/2019   Pharmacologic therapy 11/11/2019   Disorder of skeletal system 11/11/2019   Problems influencing health status 11/11/2019   Abnormal MRI, lumbar spine (08/22/2018) 11/11/2019   Chronic low back pain (1ry area of Pain) (Bilateral) w/ sciatica (Bilateral) 11/11/2019   DDD (degenerative disc disease), lumbosacral 11/11/2019   Grade 1 Anterolisthesis (L2-3, L3-4) 11/11/2019   Grade 1 Retrolisthesis (L4-5, L5-S1) 11/11/2019   Lumbar facet hypertrophy (Multilevel) (Bilateral) 11/11/2019   Lumbar facet syndrome (Multilevel) (Bilateral) (R>L) 11/11/2019   Chronic lower extremity pain (2ry area of Pain) (Bilateral) (R>L) 11/11/2019   Neurogenic pain 11/11/2019   Chronic musculoskeletal pain 11/11/2019   Infiltrating ductal carcinoma of breast (HCC) 10/08/2019   Fatty liver 09/04/2019   Nausea & vomiting 09/04/2019   Obesity (BMI 30-39.9) 09/04/2019   GERD (gastroesophageal reflux disease)    Hyperlipidemia    CKD (chronic kidney disease), stage III (HCC)    Depression    Malignant hypertension    Ocular proptosis 02/14/2019   PVD (posterior vitreous detachment), left eye 02/14/2019   Chorioretinal scar of left eye 02/14/2019   Vitreous floaters of left eye 02/14/2019   Bilateral leg weakness 01/24/2019   Insomnia 08/02/2017   Low back pain radiating to both legs 08/02/2017   Migraine 08/02/2017   Neuropathy 08/02/2017   Spinal stenosis, 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    Class: Chronic   Left shoulder tendonitis 05/23/2016    Class: Acute   Post laminectomy syndrome 05/23/2016   Anxiety disorder 01/07/2015    ONSET DATE: 07/20/2023 (MD referral)  REFERRING DIAG:  M54.16 (ICD-10-CM) - Lumbar back pain with radiculopathy  affecting left lower extremity  R26.81 (ICD-10-CM) - Unsteady gait  R29.6 (ICD-10-CM) - Frequent falls    THERAPY DIAG:  Unsteadiness on feet  Other abnormalities of gait and mobility  Muscle weakness (generalized)  Rationale for Evaluation and Treatment: Rehabilitation  SUBJECTIVE:  SUBJECTIVE STATEMENT: Larey Seat this morning.  I didn't turn the light on, so I know that's my fault.  Ended up in the floor trying to turn and sit on the toilet.  Pt accompanied by: self  PERTINENT HISTORY: Per MD:  She has been experiencing worsening back pain following a fall last month during a snowstorm. She slipped on ice and fell, initially without pain, but began experiencing consistent pain about a week later. The pain is primarily located in the lower back, radiating more to the left side, with tenderness in the buttocks and back of the thigh. On February 9th, she experienced severe pain radiating from her left foot up her leg, causing her to fall back into bed. No current pain radiating down the leg, weakness, tingling or numbness. No swelling or bruising in the affected areas. She has a history of back pain and underwent a nerve ablation procedure at the end of January to manage her symptoms.  PMH:  breast cancer with metastasis;  Lumbar laminectomy/decompression microdiscectomy (Left, 05/23/2016); Lumbar laminectomy/decompression microdiscectomy (Left, 05/23/2016); Shoulder injection (Left, 05/23/2016); Reduction mammaplasty (2011); Eye surgery (Right, 2019); Tubal ligation; and Breast lumpectomy with radioactive seed and sentinel lymph node biopsy (Right, 11/23/2020).   PAIN:  No c/o pain 09/05/2023 in back or ankle  PRECAUTIONS: Fall; decreased vision Per MD note:  She has issues with her eyes, specifically dry eyes,  requiring the use of artificial tears every hour. She has a prosthetic eye (R) and uses lubricant and tapes her left eyelid shut at night to prevent dryness. Her vision can be distorted at times, affecting her daily activities.   RED FLAGS: None   WEIGHT BEARING RESTRICTIONS: No  FALLS: Has patient fallen in last 6 months? Yes. Number of falls at least 3 falls  LIVING ENVIRONMENT: Lives with: lives with their spouse Lives in: House/apartment Stairs:  6 steps, multiple steps -getting ready to move into apartment where there are no steps Has following equipment at home: Single point cane and Walker - 4 wheeled  PLOF: Independent with household mobility with device and Independent with community mobility with device  PATIENT GOALS: Pt's goals for therapy become painfree, more independent, exercise more  OBJECTIVE:    TODAY'S TREATMENT: 09/05/2023 Activity Comments  Sidestep along counter, 2 minutes No UE support, increased trunk sway  Forward/back walking at counter 2 min   Box step/4-square x 1 min Good balance  Sit to stand, 2 x 5 reps, hands at knees From mat  Wide tandem stance EO + EC 30 sec Min guard   NuStep, Level 5, 8 min 4 extremities SPM >60 for strength, flexibility        HOME EXERCISE PROGRAM Access Code: [email or web address] URL: [email or web address] Date: 08/29/2023 Prepared by: [facility] - Outpatient  Rehab - [facility]  Exercises - Supine Posterior Pelvic Tilt  - 1 x daily - 7 x weekly - 1-2 sets - 10 reps - Supine Hamstring Stretch with Strap  - 1 x daily - 5 x weekly - 2 sets - 30 sec hold - Supine Lower Trunk Rotation  - 1 x daily - 5 x weekly - 2 sets - 20 reps - Seated Anterior Pelvic Tilt  - 1 x daily - 7 x weekly - 1-2 sets - 10 reps - Wide Tandem Stance with Eyes Open  - 1 x daily - 5 x weekly - 2 sets - 30 sec hold - Standing Toe Taps  - 1 x daily - 5 x weekly -  2 sets - 10 reps - Standing Shoulder Row with Anchored Resistance  - 1 x  daily - 5 x weekly - 2 sets - 10 reps     PATIENT EDUCATION: Education details: Fall prevention in regards to bathroom area-lighting, nightlights, taping for foot position to make sure she is close enough to toilet; ways to incorporate exercise machines into exercise routine Person educated: Patient Education method: Explanation, Demonstration, Tactile cues, Verbal cues, and Handouts Education comprehension: verbalized understanding and returned demonstration     ---------------------------------------------  Note: Objective measures were completed at Evaluation unless otherwise noted.  DIAGNOSTIC FINDINGS: x-rays to be completed 07/26/2023  COGNITION: Overall cognitive status: Within functional limits for tasks assessed   SENSATION: No reports of numbness   POSTURE: rounded shoulders, forward head, and posterior pelvic tilt  LOWER EXTREMITY ROM:     Active  Right Eval Left Eval  Hip flexion    Hip extension    Hip abduction    Hip adduction    Hip internal rotation    Hip external rotation    Knee flexion    Knee extension    Ankle dorsiflexion 5 5  Ankle plantarflexion    Ankle inversion    Ankle eversion     (Blank rows = not tested)  LOWER EXTREMITY MMT:    MMT Right Eval Left Eval  Hip flexion 4+ 4+  Hip extension    Hip abduction 4 4  Hip adduction 4 4  Hip internal rotation    Hip external rotation    Knee flexion 4 4  Knee extension 4+ 4+  Ankle dorsiflexion 4 4  Ankle plantarflexion    Ankle inversion    Ankle eversion    (Blank rows = not tested)  LUMBAR: Palpation:  Tender to palpation along paraspinals thoracic spine, SI joint area, bilateral buttocks/gluts/piriformis; pt tender to palpation along IT band R>L Baseline pain in standing:  0/10 Repeated standing flexion: 3/10 pain, into bilat buttocks Repeated standing extension:1-2/10 pain centralized at mid- low back Limited in lumbar extension flexibility  Hamstring flexibility from  supine 90/90:  R  -15 degrees; L -20 degrees SLR test from supine:  R 85 degrees, L 80 degrees  TRANSFERS: Assistive device utilized: None BUE support Sit to stand: Modified independence Stand to sit: Modified independence   GAIT: Gait pattern: step through pattern, antalgic, lateral lean- Right, and lateral lean- Left Distance walked: 40-50 ft Assistive device utilized: Single point cane Level of assistance: Modified independence Comments: Has rollator at home, reports less pain with gait when using rollator   PATIENT SURVEYS:  Modified Oswestry (Back Index):  48                                                                                                                               TREATMENT DATE: 07/26/2023    PATIENT EDUCATION: Education details: Eval results, POC; Educated pt on posture/positioning with use of lumbar roll  with sititng; discussed proper positioning/height of rollator; discussed pain management with prioritizing movements that centralize symptoms and lessen movements that cause symptom radiation.  Initiated HEP-see below Person educated: Patient Education method: Explanation, Demonstration, and Handouts Education comprehension: verbalized understanding, returned demonstration, and needs further education  HOME EXERCISE PROGRAM: Access Code: [email or web address] URL: [email or web address] Date: 07/26/2023 Prepared by: [facility] - Outpatient  Rehab - [facility]  Exercises - Supine Posterior Pelvic Tilt  - 1 x daily - 7 x weekly - 1-2 sets - 10 reps - Seated Anterior Pelvic Tilt  - 1 x daily - 7 x weekly - 1-2 sets - 10 reps  GOALS: Goals reviewed with patient? Yes  SHORT TERM GOALS: Target date: 08/25/2023  Pt will be independent with HEP for improved strength, flexibility, balance, gait. Baseline: Pt denies questions on HEP 08/23/23 Goal status: MET  08/23/23  2.  Pt will report low back pain reduced by 50% during functional daily  activities. Baseline:  Reports LBP is "touch and go." Reports that is it a "teeny weeny bit better" 08/24/23  Goal status: IN PROGRESS 08/24/23   3. Patient to score at least 46/56 on Berg in order to decrease risk of falls.  Baseline: 42 08/24/23 Goal status:IN PROGRESS 08/24/23  4.  Pt will verbalize understanding of fall prevention and posture/body mechanics for decreased pain/decreased fall risk.  Baseline: provided today 08/23/23  Goal status: MET 08/23/23   LONG TERM GOALS: Target date: 09/22/2023  Pt will be independent with HEP for improved strength, flexibility, pain, balance, gait. Baseline:  Goal status: IN PROGRESS  2.  Back index score to decrease to </= 40 for improved back pain and decreased disability. Baseline:  Goal status: IN PROGRESS  3.  Berg Score to improve by 10 points from baseline to demo decreased fall risk. Baseline:  Goal status:IN PROGRESS  4.  Pt will verbalize plans for continued community fitness upon d/c from PT to maximize gains in PT. Baseline:  Goal status: IN PROGRESS  ASSESSMENT:  CLINICAL IMPRESSION: Pt presents today with reports of no pain, but she did have a fall this morning.  She reports no injury from fall. Skilled PT session focused on balance and strength/flexibility; also provided education about fall prevention and ways to incorporate exercise machines into her routine. With balance exercises vision removed, she has increased sway and intermittent UE support.  She does not have any c/o pain in ankle/back today.  Pt will continue to benefit from skilled PT towards goals for improved functional mobility and decreased fall risk.   OBJECTIVE IMPAIRMENTS: Abnormal gait, decreased balance, decreased mobility, difficulty walking, decreased ROM, decreased strength, impaired flexibility, postural dysfunction, and pain.   ACTIVITY LIMITATIONS: lifting, sitting, standing, stairs, transfers, and locomotion level  PARTICIPATION LIMITATIONS:  community activity and exercise  PERSONAL FACTORS: 3+ comorbidities: see above  are also affecting patient's functional outcome.   REHAB POTENTIAL: Good  CLINICAL DECISION MAKING: Evolving/moderate complexity  EVALUATION COMPLEXITY: Moderate  PLAN:  PT FREQUENCY: 2x/week  PT DURATION: 8 weeks plus eval visit  PLANNED INTERVENTIONS: 97110-Therapeutic exercises, 97530- Therapeutic activity, [id number]- Neuromuscular re-education, 97535- Self Care, [id number]- Manual therapy, [id number]- Gait training, [phone]- Aquatic Therapy, [phone]- Electrical stimulation (unattended), 97035- Ultrasound, Patient/Family education, Balance training, DME instructions, and Moist heat  PLAN FOR NEXT SESSION:  Balance, strengthening, flexibility exercises.   *HOLD on Aquatics for now*.  Will need to give aquatic information (she will do this after several weeks in the clinic)  AQUATICS: Frequency: (after 3  weeks in clinic), pt to transition to 1x/wk aquatics and 1x/wk in clinic Duration: 3-5 weeks Special Instruction: flexibility, strength, balance work; pt is interested in aquatic exercise and may benefit from HEP for aquatic therapy and discussion on where to do further aquatic exercise     Lonia Blood, PT 09/05/23 8:48 AM Phone: [phone] Fax: [phone]  [facility] at [facility] [address] [place], [place] [address] Phone # [phone] Fax # [phone]

## 2023-09-07 ENCOUNTER — Ambulatory Visit: Payer: Medicare Other | Admitting: Physical Therapy

## 2023-09-07 ENCOUNTER — Encounter: Payer: Self-pay | Admitting: Physical Therapy

## 2023-09-07 DIAGNOSIS — R293 Abnormal posture: Secondary | ICD-10-CM | POA: Diagnosis not present

## 2023-09-07 DIAGNOSIS — M6281 Muscle weakness (generalized): Secondary | ICD-10-CM | POA: Diagnosis not present

## 2023-09-07 DIAGNOSIS — M5459 Other low back pain: Secondary | ICD-10-CM | POA: Diagnosis not present

## 2023-09-07 DIAGNOSIS — R2681 Unsteadiness on feet: Secondary | ICD-10-CM | POA: Diagnosis not present

## 2023-09-07 DIAGNOSIS — R2689 Other abnormalities of gait and mobility: Secondary | ICD-10-CM

## 2023-09-07 DIAGNOSIS — R262 Difficulty in walking, not elsewhere classified: Secondary | ICD-10-CM | POA: Diagnosis not present

## 2023-09-07 NOTE — Therapy (Signed)
 OUTPATIENT PHYSICAL THERAPY NEURO TREATMENT   Patient Name: Brandy [name] MRN: [id number] DOB:06/18/1965, 58 y.o., female Today's Date: 09/07/2023   PCP: Caesar Bookman, NP  REFERRING PROVIDER: Caesar Bookman, NP   END OF SESSION:  PT End of Session - 09/07/23 0855     Visit Number 7    Number of Visits 17    Date for PT Re-Evaluation 09/22/23    Authorization Type [employer] Medicare    Authorization Time Period approved 17 PT visits from 07/26/2023-09/20/2023    Authorization - Visit Number 7    Authorization - Number of Visits 17    Progress Note Due on Visit 10    PT Start Time 0852    PT Stop Time 0930    PT Time Calculation (min) 38 min    Equipment Utilized During Treatment Gait belt    Activity Tolerance Patient tolerated treatment well    Behavior During Therapy WFL for tasks assessed/performed                   Past Medical History:  Diagnosis Date   Acute pansinusitis 08/02/2017   Anxiety    Arthritis    ASD (atrial septal defect)    s/p closure with Amplatzer device 10/05/04 (Dr. Celso Shubham Thackston, [facility]) 10/05/04   Cancer [facility])    Cataract    Chronic pain    CKD (chronic kidney disease)    Dyspnea    Fatty liver 09/04/2019   GERD (gastroesophageal reflux disease)    Heart murmur    no longer heard   History of hiatal hernia    Hyperlipidemia    Legally blind in right eye, as defined in [place]    Lumbar herniated disc    Migraines    On home oxygen therapy 06/15/2022   Pt was given O2 on D/C from [facility] 04/2022   OSA on CPAP 06/15/2022   PONV (postoperative nausea and vomiting)    Sciatica    Past Surgical History:  Procedure Laterality Date   ABLATION     BREAST LUMPECTOMY WITH RADIOACTIVE SEED AND SENTINEL LYMPH NODE BIOPSY Right 11/23/2020   Procedure: RIGHT BREAST LUMPECTOMY WITH RADIOACTIVE SEED AND RIGHT AXILLARY SENTINEL LYMPH NODE BIOPSY;  Surgeon: Emelia Loron, MD;  Location: [facility] OR;  Service: General;  Laterality:  Right;   BREAST SURGERY Bilateral 2011   Breast Reduction Surgery   BUNIONECTOMY     CARDIAC CATHETERIZATION     10/05/04 [facility]): LM < 25%, otherwise normal coronaries. No pulmonary HTN, Mildly enlarged RV. Secundum ASD s/p closure.   CARDIAC SURGERY     CATARACT EXTRACTION     CLEFT PALATE REPAIR     s/p cleft lip and palate repair   EYE SURGERY Right 2019   right eye removed   LUMBAR LAMINECTOMY/DECOMPRESSION MICRODISCECTOMY Left 05/23/2016   Procedure: LEFT L4-L5 LATERAL RECESS DECOMPRESSION WITH CENTRAL AND RIGHT DECOMPRESSION VIA LEFT SIDE;  Surgeon: Kerrin Champagne, MD;  Location: [facility] OR;  Service: Orthopedics;  Laterality: Left;   LUMBAR LAMINECTOMY/DECOMPRESSION MICRODISCECTOMY Left 05/23/2016   Procedure: LUMBAR LAMINECTOMY/DECOMPRESSION MICRODISCECTOMY Lumbar five - Sacral One 1 LEVEL;  Surgeon: Kerrin Champagne, MD;  Location: [facility] OR;  Service: Orthopedics;  Laterality: Left;   REDUCTION MAMMAPLASTY  2011   SHOULDER INJECTION Left 05/23/2016   Procedure: SHOULDER INJECTION;  Surgeon: Kerrin Champagne, MD;  Location: [facility] OR;  Service: Orthopedics;  Laterality: Left;  band-aid per pa-c   TRANSTHORACIC ECHOCARDIOGRAM  12/15/05 [facility]): Mild LVH, EF > 55%, grade 1 diastolic dysfunction, Trivial MR/PR/TR.   TUBAL LIGATION     Patient Active Problem List   Diagnosis Date Noted   Chronic obstructive pulmonary disease, unspecified COPD type (HCC) 03/15/2023   Major depressive disorder, single episode, severe without psychotic features (HCC) 03/15/2023   Chronic respiratory failure with hypoxia (HCC) 05/08/2022   Orthostatic hypotension 04/24/2022   Hyperbilirubinemia 04/24/2022   Snoring 07/14/2021   Fatigue 07/14/2021   Daytime somnolence 07/14/2021   Status post device closure of ASD 06/03/2021   Secundum ASD 06/03/2021   Decreased diffusion capacity 05/14/2021   Dyspnea 03/04/2021   Pulmonary nodules 04/01/2020   Primary malignant neoplasm of breast with metastasis (HCC) 02/24/2020    Spondylosis without myelopathy or radiculopathy, lumbosacral region 12/31/2019   Chronic low back pain (Bilateral) w/o sciatica 12/31/2019   Chronic hip pain (3ry area of Pain) (Bilateral) (R>L) 11/27/2019   Chronic sacroiliac joint pain (Left) 11/27/2019   Chronic pain syndrome 11/11/2019   Pharmacologic therapy 11/11/2019   Disorder of skeletal system 11/11/2019   Problems influencing health status 11/11/2019   Abnormal MRI, lumbar spine (08/22/2018) 11/11/2019   Chronic low back pain (1ry area of Pain) (Bilateral) w/ sciatica (Bilateral) 11/11/2019   DDD (degenerative disc disease), lumbosacral 11/11/2019   Grade 1 Anterolisthesis (L2-3, L3-4) 11/11/2019   Grade 1 Retrolisthesis (L4-5, L5-S1) 11/11/2019   Lumbar facet hypertrophy (Multilevel) (Bilateral) 11/11/2019   Lumbar facet syndrome (Multilevel) (Bilateral) (R>L) 11/11/2019   Chronic lower extremity pain (2ry area of Pain) (Bilateral) (R>L) 11/11/2019   Neurogenic pain 11/11/2019   Chronic musculoskeletal pain 11/11/2019   Infiltrating ductal carcinoma of breast (HCC) 10/08/2019   Fatty liver 09/04/2019   Nausea & vomiting 09/04/2019   Obesity (BMI 30-39.9) 09/04/2019   GERD (gastroesophageal reflux disease)    Hyperlipidemia    CKD (chronic kidney disease), stage III (HCC)    Depression    Malignant hypertension    Ocular proptosis 02/14/2019   PVD (posterior vitreous detachment), left eye 02/14/2019   Chorioretinal scar of left eye 02/14/2019   Vitreous floaters of left eye 02/14/2019   Bilateral leg weakness 01/24/2019   Insomnia 08/02/2017   Low back pain radiating to both legs 08/02/2017   Migraine 08/02/2017   Neuropathy 08/02/2017   Spinal stenosis, 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    Class: Chronic   Left shoulder tendonitis 05/23/2016    Class: Acute   Post laminectomy syndrome 05/23/2016   Anxiety disorder 01/07/2015    ONSET DATE: 07/20/2023 (MD referral)  REFERRING DIAG:   M54.16 (ICD-10-CM) - Lumbar back pain with radiculopathy affecting left lower extremity  R26.81 (ICD-10-CM) - Unsteady gait  R29.6 (ICD-10-CM) - Frequent falls    THERAPY DIAG:  Unsteadiness on feet  Other abnormalities of gait and mobility  Muscle weakness (generalized)  Rationale for Evaluation and Treatment: Rehabilitation  SUBJECTIVE:  SUBJECTIVE STATEMENT: Had some pain in my ankle after leaving here the other day, when I put weight on it, but taking the weight off of it lessens.  Pt accompanied by: self  PERTINENT HISTORY: Per MD:  She has been experiencing worsening back pain following a fall last month during a snowstorm. She slipped on ice and fell, initially without pain, but began experiencing consistent pain about a week later. The pain is primarily located in the lower back, radiating more to the left side, with tenderness in the buttocks and back of the thigh. On February 9th, she experienced severe pain radiating from her left foot up her leg, causing her to fall back into bed. No current pain radiating down the leg, weakness, tingling or numbness. No swelling or bruising in the affected areas. She has a history of back pain and underwent a nerve ablation procedure at the end of January to manage her symptoms.  PMH:  breast cancer with metastasis;  Lumbar laminectomy/decompression microdiscectomy (Left, 05/23/2016); Lumbar laminectomy/decompression microdiscectomy (Left, 05/23/2016); Shoulder injection (Left, 05/23/2016); Reduction mammaplasty (2011); Eye surgery (Right, 2019); Tubal ligation; and Breast lumpectomy with radioactive seed and sentinel lymph node biopsy (Right, 11/23/2020).   PAIN:  No c/o pain 09/07/2023 in back or ankle  PRECAUTIONS: Fall; decreased vision Per MD note:  She  has issues with her eyes, specifically dry eyes, requiring the use of artificial tears every hour. She has a prosthetic eye (R) and uses lubricant and tapes her left eyelid shut at night to prevent dryness. Her vision can be distorted at times, affecting her daily activities.   RED FLAGS: None   WEIGHT BEARING RESTRICTIONS: No  FALLS: Has patient fallen in last 6 months? Yes. Number of falls at least 3 falls  LIVING ENVIRONMENT: Lives with: lives with their spouse Lives in: House/apartment Stairs:  6 steps, multiple steps -getting ready to move into apartment where there are no steps Has following equipment at home: Single point cane and Walker - 4 wheeled  PLOF: Independent with household mobility with device and Independent with community mobility with device  PATIENT GOALS: Pt's goals for therapy become painfree, more independent, exercise more  OBJECTIVE:    TODAY'S TREATMENT: 09/07/2023 Activity Comments  Seated ankle dflex, 2 x 10 Seated ankle eversion, x 10 Seated ankle plantar flexion 2 x 10 Seated ankle/gastroc stretch, 3 x 15"  Medial L foot pain with eversion  Sidestep along counter, 2 minutes Cues for core activation and less trunk sway  Forward/back walking at counter 2 min Cues for posture, balance  Box step/4-square x 1 min   Forward/back step and weightshift, with added arm swing, 2 x 10 reps 1 UE support at counter, good step length  Gait indoors 85 ft x 2, then 150 ft outdoors with 4WW Pt bumps into items on R side x 3, cues to veer more towards L to avoid R obstacles.         HOME EXERCISE PROGRAM Access Code: [email or web address] URL: [email or web address] Date: 08/29/2023 Prepared by: [facility] - Outpatient  Rehab - [facility]  Exercises - Supine Posterior Pelvic Tilt  - 1 x daily - 7 x weekly - 1-2 sets - 10 reps - Supine Hamstring Stretch with Strap  - 1 x daily - 5 x weekly - 2 sets - 30 sec hold - Supine Lower Trunk Rotation  - 1 x  daily - 5 x weekly - 2 sets - 20 reps - Seated Anterior Pelvic Tilt  -  1 x daily - 7 x weekly - 1-2 sets - 10 reps - Wide Tandem Stance with Eyes Open  - 1 x daily - 5 x weekly - 2 sets - 30 sec hold - Standing Toe Taps  - 1 x daily - 5 x weekly - 2 sets - 10 reps - Standing Shoulder Row with Anchored Resistance  - 1 x daily - 5 x weekly - 2 sets - 10 reps     PATIENT EDUCATION: Education details: Continue current HEP  Person educated: Patient Education method: Explanation, Demonstration, Tactile cues, Verbal cues, and Handouts Education comprehension: verbalized understanding and returned demonstration     ---------------------------------------------  Note: Objective measures were completed at Evaluation unless otherwise noted.  DIAGNOSTIC FINDINGS: x-rays to be completed 07/26/2023  COGNITION: Overall cognitive status: Within functional limits for tasks assessed   SENSATION: No reports of numbness   POSTURE: rounded shoulders, forward head, and posterior pelvic tilt  LOWER EXTREMITY ROM:     Active  Right Eval Left Eval  Hip flexion    Hip extension    Hip abduction    Hip adduction    Hip internal rotation    Hip external rotation    Knee flexion    Knee extension    Ankle dorsiflexion 5 5  Ankle plantarflexion    Ankle inversion    Ankle eversion     (Blank rows = not tested)  LOWER EXTREMITY MMT:    MMT Right Eval Left Eval  Hip flexion 4+ 4+  Hip extension    Hip abduction 4 4  Hip adduction 4 4  Hip internal rotation    Hip external rotation    Knee flexion 4 4  Knee extension 4+ 4+  Ankle dorsiflexion 4 4  Ankle plantarflexion    Ankle inversion    Ankle eversion    (Blank rows = not tested)  LUMBAR: Palpation:  Tender to palpation along paraspinals thoracic spine, SI joint area, bilateral buttocks/gluts/piriformis; pt tender to palpation along IT band R>L Baseline pain in standing:  0/10 Repeated standing flexion: 3/10 pain, into bilat  buttocks Repeated standing extension:1-2/10 pain centralized at mid- low back Limited in lumbar extension flexibility  Hamstring flexibility from supine 90/90:  R  -15 degrees; L -20 degrees SLR test from supine:  R 85 degrees, L 80 degrees  TRANSFERS: Assistive device utilized: None BUE support Sit to stand: Modified independence Stand to sit: Modified independence   GAIT: Gait pattern: step through pattern, antalgic, lateral lean- Right, and lateral lean- Left Distance walked: 40-50 ft Assistive device utilized: Single point cane Level of assistance: Modified independence Comments: Has rollator at home, reports less pain with gait when using rollator   PATIENT SURVEYS:  Modified Oswestry (Back Index):  48  TREATMENT DATE: 07/26/2023    PATIENT EDUCATION: Education details: Eval results, POC; Educated pt on posture/positioning with use of lumbar roll with sititng; discussed proper positioning/height of rollator; discussed pain management with prioritizing movements that centralize symptoms and lessen movements that cause symptom radiation.  Initiated HEP-see below Person educated: Patient Education method: Explanation, Demonstration, and Handouts Education comprehension: verbalized understanding, returned demonstration, and needs further education  HOME EXERCISE PROGRAM: Access Code: [email or web address] URL: [email or web address] Date: 07/26/2023 Prepared by: [facility] - Outpatient  Rehab - [facility]  Exercises - Supine Posterior Pelvic Tilt  - 1 x daily - 7 x weekly - 1-2 sets - 10 reps - Seated Anterior Pelvic Tilt  - 1 x daily - 7 x weekly - 1-2 sets - 10 reps  GOALS: Goals reviewed with patient? Yes  SHORT TERM GOALS: Target date: 08/25/2023  Pt will be independent with HEP for improved strength, flexibility, balance,  gait. Baseline: Pt denies questions on HEP 08/23/23 Goal status: MET  08/23/23  2.  Pt will report low back pain reduced by 50% during functional daily activities. Baseline:  Reports LBP is "touch and go." Reports that is it a "teeny weeny bit better" 08/24/23  Goal status: IN PROGRESS 08/24/23   3. Patient to score at least 46/56 on Berg in order to decrease risk of falls.  Baseline: 42 08/24/23 Goal status:IN PROGRESS 08/24/23  4.  Pt will verbalize understanding of fall prevention and posture/body mechanics for decreased pain/decreased fall risk.  Baseline: provided today 08/23/23  Goal status: MET 08/23/23   LONG TERM GOALS: Target date: 09/22/2023  Pt will be independent with HEP for improved strength, flexibility, pain, balance, gait. Baseline:  Goal status: IN PROGRESS  2.  Back index score to decrease to </= 40 for improved back pain and decreased disability. Baseline:  Goal status: IN PROGRESS  3.  Berg Score to improve by 10 points from baseline to demo decreased fall risk. Baseline:  Goal status:IN PROGRESS  4.  Pt will verbalize plans for continued community fitness upon d/c from PT to maximize gains in PT. Baseline:  Goal status: IN PROGRESS  ASSESSMENT:  CLINICAL IMPRESSION: Pt presents today with no new complaints. Skilled PT session focused on ankle flexibility, balance, and gait. Pt tolerates seated and standing exercises well; with cues, she is able to take longer step length, strides.  She does have more postural awareness and more upright posture with balance exercises today.  Pt will continue to benefit from skilled PT towards goals for improved functional mobility and decreased fall risk.   OBJECTIVE IMPAIRMENTS: Abnormal gait, decreased balance, decreased mobility, difficulty walking, decreased ROM, decreased strength, impaired flexibility, postural dysfunction, and pain.   ACTIVITY LIMITATIONS: lifting, sitting, standing, stairs, transfers, and locomotion  level  PARTICIPATION LIMITATIONS: community activity and exercise  PERSONAL FACTORS: 3+ comorbidities: see above  are also affecting patient's functional outcome.   REHAB POTENTIAL: Good  CLINICAL DECISION MAKING: Evolving/moderate complexity  EVALUATION COMPLEXITY: Moderate  PLAN:  PT FREQUENCY: 2x/week  PT DURATION: 8 weeks plus eval visit  PLANNED INTERVENTIONS: 97110-Therapeutic exercises, 97530- Therapeutic activity, [id number]- Neuromuscular re-education, 97535- Self Care, 16109- Manual therapy, [id number]- Gait training, [phone]- Aquatic Therapy, [phone]- Electrical stimulation (unattended), 97035- Ultrasound, Patient/Family education, Balance training, DME instructions, and Moist heat  PLAN FOR NEXT SESSION:  Continue balance, strengthening, ankle flexibility exercises (consider adding to HEP).   *HOLD on Aquatics for now*.  Will need to give aquatic information (she will do this after several weeks  in the clinic)  AQUATICS: Frequency: (after 3 weeks in clinic), pt to transition to 1x/wk aquatics and 1x/wk in clinic Duration: 3-5 weeks Special Instruction: flexibility, strength, balance work; pt is interested in aquatic exercise and may benefit from HEP for aquatic therapy and discussion on where to do further aquatic exercise     Lonia Blood, PT 09/07/23 9:30 AM Phone: [phone] Fax: [phone]  [facility] at [facility] [address] [place], [place] [address] Phone # [phone] Fax # [phone]

## 2023-09-08 ENCOUNTER — Other Ambulatory Visit: Payer: Self-pay | Admitting: Hematology

## 2023-09-08 DIAGNOSIS — C50919 Malignant neoplasm of unspecified site of unspecified female breast: Secondary | ICD-10-CM

## 2023-09-11 ENCOUNTER — Ambulatory Visit

## 2023-09-11 DIAGNOSIS — R293 Abnormal posture: Secondary | ICD-10-CM | POA: Diagnosis not present

## 2023-09-11 DIAGNOSIS — M5459 Other low back pain: Secondary | ICD-10-CM | POA: Diagnosis not present

## 2023-09-11 DIAGNOSIS — M6281 Muscle weakness (generalized): Secondary | ICD-10-CM | POA: Diagnosis not present

## 2023-09-11 DIAGNOSIS — R2689 Other abnormalities of gait and mobility: Secondary | ICD-10-CM | POA: Diagnosis not present

## 2023-09-11 DIAGNOSIS — R262 Difficulty in walking, not elsewhere classified: Secondary | ICD-10-CM

## 2023-09-11 DIAGNOSIS — R2681 Unsteadiness on feet: Secondary | ICD-10-CM

## 2023-09-11 NOTE — Therapy (Signed)
 OUTPATIENT PHYSICAL THERAPY NEURO TREATMENT   Patient Name: Brandy [name] MRN: [id number] DOB:07-Mar-1966, 58 y.o., female Today's Date: 09/11/2023   PCP: Caesar Bookman, NP  REFERRING PROVIDER: Caesar Bookman, NP   END OF SESSION:  PT End of Session - 09/11/23 0801     Visit Number 8    Number of Visits 17    Date for PT Re-Evaluation 09/22/23    Authorization Type [employer] Medicare    Authorization Time Period approved 17 PT visits from 07/26/2023-09/20/2023    Authorization - Visit Number 8    Authorization - Number of Visits 17    Progress Note Due on Visit 10    PT Start Time 0800    PT Stop Time 0845    PT Time Calculation (min) 45 min    Equipment Utilized During Treatment Gait belt    Activity Tolerance Patient tolerated treatment well    Behavior During Therapy WFL for tasks assessed/performed                   Past Medical History:  Diagnosis Date   Acute pansinusitis 08/02/2017   Anxiety    Arthritis    ASD (atrial septal defect)    s/p closure with Amplatzer device 10/05/04 (Dr. Celso Amy, [facility]) 10/05/04   Cancer [facility])    Cataract    Chronic pain    CKD (chronic kidney disease)    Dyspnea    Fatty liver 09/04/2019   GERD (gastroesophageal reflux disease)    Heart murmur    no longer heard   History of hiatal hernia    Hyperlipidemia    Legally blind in right eye, as defined in [place]    Lumbar herniated disc    Migraines    On home oxygen therapy 06/15/2022   Pt was given O2 on D/C from [facility] 04/2022   OSA on CPAP 06/15/2022   PONV (postoperative nausea and vomiting)    Sciatica    Past Surgical History:  Procedure Laterality Date   ABLATION     BREAST LUMPECTOMY WITH RADIOACTIVE SEED AND SENTINEL LYMPH NODE BIOPSY Right 11/23/2020   Procedure: RIGHT BREAST LUMPECTOMY WITH RADIOACTIVE SEED AND RIGHT AXILLARY SENTINEL LYMPH NODE BIOPSY;  Surgeon: Emelia Loron, MD;  Location: [facility] OR;  Service: General;  Laterality:  Right;   BREAST SURGERY Bilateral 2011   Breast Reduction Surgery   BUNIONECTOMY     CARDIAC CATHETERIZATION     10/05/04 [facility]): LM < 25%, otherwise normal coronaries. No pulmonary HTN, Mildly enlarged RV. Secundum ASD s/p closure.   CARDIAC SURGERY     CATARACT EXTRACTION     CLEFT PALATE REPAIR     s/p cleft lip and palate repair   EYE SURGERY Right 2019   right eye removed   LUMBAR LAMINECTOMY/DECOMPRESSION MICRODISCECTOMY Left 05/23/2016   Procedure: LEFT L4-L5 LATERAL RECESS DECOMPRESSION WITH CENTRAL AND RIGHT DECOMPRESSION VIA LEFT SIDE;  Surgeon: Kerrin Champagne, MD;  Location: [facility] OR;  Service: Orthopedics;  Laterality: Left;   LUMBAR LAMINECTOMY/DECOMPRESSION MICRODISCECTOMY Left 05/23/2016   Procedure: LUMBAR LAMINECTOMY/DECOMPRESSION MICRODISCECTOMY Lumbar five - Sacral One 1 LEVEL;  Surgeon: Kerrin Champagne, MD;  Location: [facility] OR;  Service: Orthopedics;  Laterality: Left;   REDUCTION MAMMAPLASTY  2011   SHOULDER INJECTION Left 05/23/2016   Procedure: SHOULDER INJECTION;  Surgeon: Kerrin Champagne, MD;  Location: [facility] OR;  Service: Orthopedics;  Laterality: Left;  band-aid per pa-c   TRANSTHORACIC ECHOCARDIOGRAM  12/15/05 [facility]): Mild LVH, EF > 55%, grade 1 diastolic dysfunction, Trivial MR/PR/TR.   TUBAL LIGATION     Patient Active Problem List   Diagnosis Date Noted   Chronic obstructive pulmonary disease, unspecified COPD type (HCC) 03/15/2023   Major depressive disorder, single episode, severe without psychotic features (HCC) 03/15/2023   Chronic respiratory failure with hypoxia (HCC) 05/08/2022   Orthostatic hypotension 04/24/2022   Hyperbilirubinemia 04/24/2022   Snoring 07/14/2021   Fatigue 07/14/2021   Daytime somnolence 07/14/2021   Status post device closure of ASD 06/03/2021   Secundum ASD 06/03/2021   Decreased diffusion capacity 05/14/2021   Dyspnea 03/04/2021   Pulmonary nodules 04/01/2020   Primary malignant neoplasm of breast with metastasis (HCC) 02/24/2020    Spondylosis without myelopathy or radiculopathy, lumbosacral region 12/31/2019   Chronic low back pain (Bilateral) w/o sciatica 12/31/2019   Chronic hip pain (3ry area of Pain) (Bilateral) (R>L) 11/27/2019   Chronic sacroiliac joint pain (Left) 11/27/2019   Chronic pain syndrome 11/11/2019   Pharmacologic therapy 11/11/2019   Disorder of skeletal system 11/11/2019   Problems influencing health status 11/11/2019   Abnormal MRI, lumbar spine (08/22/2018) 11/11/2019   Chronic low back pain (1ry area of Pain) (Bilateral) w/ sciatica (Bilateral) 11/11/2019   DDD (degenerative disc disease), lumbosacral 11/11/2019   Grade 1 Anterolisthesis (L2-3, L3-4) 11/11/2019   Grade 1 Retrolisthesis (L4-5, L5-S1) 11/11/2019   Lumbar facet hypertrophy (Multilevel) (Bilateral) 11/11/2019   Lumbar facet syndrome (Multilevel) (Bilateral) (R>L) 11/11/2019   Chronic lower extremity pain (2ry area of Pain) (Bilateral) (R>L) 11/11/2019   Neurogenic pain 11/11/2019   Chronic musculoskeletal pain 11/11/2019   Infiltrating ductal carcinoma of breast (HCC) 10/08/2019   Fatty liver 09/04/2019   Nausea & vomiting 09/04/2019   Obesity (BMI 30-39.9) 09/04/2019   GERD (gastroesophageal reflux disease)    Hyperlipidemia    CKD (chronic kidney disease), stage III (HCC)    Depression    Malignant hypertension    Ocular proptosis 02/14/2019   PVD (posterior vitreous detachment), left eye 02/14/2019   Chorioretinal scar of left eye 02/14/2019   Vitreous floaters of left eye 02/14/2019   Bilateral leg weakness 01/24/2019   Insomnia 08/02/2017   Low back pain radiating to both legs 08/02/2017   Migraine 08/02/2017   Neuropathy 08/02/2017   Spinal stenosis, 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    Class: Chronic   Left shoulder tendonitis 05/23/2016    Class: Acute   Post laminectomy syndrome 05/23/2016   Anxiety disorder 01/07/2015    ONSET DATE: 07/20/2023 (MD referral)  REFERRING DIAG:   M54.16 (ICD-10-CM) - Lumbar back pain with radiculopathy affecting left lower extremity  R26.81 (ICD-10-CM) - Unsteady gait  R29.6 (ICD-10-CM) - Frequent falls    THERAPY DIAG:  Unsteadiness on feet  Other abnormalities of gait and mobility  Muscle weakness (generalized)  Other low back pain  Abnormal posture  Difficulty in walking, not elsewhere classified  Rationale for Evaluation and Treatment: Rehabilitation  SUBJECTIVE:  SUBJECTIVE STATEMENT: Not having any pain at present. No radiating pain right now.    Pt accompanied by: self  PERTINENT HISTORY: Per MD:  She has been experiencing worsening back pain following a fall last month during a snowstorm. She slipped on ice and fell, initially without pain, but began experiencing consistent pain about a week later. The pain is primarily located in the lower back, radiating more to the left side, with tenderness in the buttocks and back of the thigh. On February 9th, she experienced severe pain radiating from her left foot up her leg, causing her to fall back into bed. No current pain radiating down the leg, weakness, tingling or numbness. No swelling or bruising in the affected areas. She has a history of back pain and underwent a nerve ablation procedure at the end of January to manage her symptoms.  PMH:  breast cancer with metastasis;  Lumbar laminectomy/decompression microdiscectomy (Left, 05/23/2016); Lumbar laminectomy/decompression microdiscectomy (Left, 05/23/2016); Shoulder injection (Left, 05/23/2016); Reduction mammaplasty (2011); Eye surgery (Right, 2019); Tubal ligation; and Breast lumpectomy with radioactive seed and sentinel lymph node biopsy (Right, 11/23/2020).   PAIN:  No c/o pain 09/07/2023 in back or ankle  PRECAUTIONS: Fall;  decreased vision Per MD note:  She has issues with her eyes, specifically dry eyes, requiring the use of artificial tears every hour. She has a prosthetic eye (R) and uses lubricant and tapes her left eyelid shut at night to prevent dryness. Her vision can be distorted at times, affecting her daily activities.   RED FLAGS: None   WEIGHT BEARING RESTRICTIONS: No  FALLS: Has patient fallen in last 6 months? Yes. Number of falls at least 3 falls  LIVING ENVIRONMENT: Lives with: lives with their spouse Lives in: House/apartment Stairs:  6 steps, multiple steps -getting ready to move into apartment where there are no steps Has following equipment at home: Single point cane and Walker - 4 wheeled  PLOF: Independent with household mobility with device and Independent with community mobility with device  PATIENT GOALS: Pt's goals for therapy become painfree, more independent, exercise more  OBJECTIVE:   TODAY'S TREATMENT: 09/11/23 Activity Comments  Palpation  Demo some swelling to ATF lig left ankle. Tender to palpation. Applied biofreeze and instructed in transverse friction massage and AROM-ankle ABC   Dynamic balance -retrowalk 2x45 ft -sidestep 2x45 ft -tandem walk 1x25 ft -toe walk 1x25 ft  Back strength -seated unilat row 2x10, 10# -seated wide bar row 2x10, 15# -seated good morning 1x10: 15#, 20#               TODAY'S TREATMENT: 09/07/2023 Activity Comments  Seated ankle dflex, 2 x 10 Seated ankle eversion, x 10 Seated ankle plantar flexion 2 x 10 Seated ankle/gastroc stretch, 3 x 15"  Medial L foot pain with eversion  Sidestep along counter, 2 minutes Cues for core activation and less trunk sway  Forward/back walking at counter 2 min Cues for posture, balance  Box step/4-square x 1 min   Forward/back step and weightshift, with added arm swing, 2 x 10 reps 1 UE support at counter, good step length  Gait indoors 85 ft x 2, then 150 ft outdoors with 4WW Pt bumps into items  on R side x 3, cues to veer more towards L to avoid R obstacles.         HOME EXERCISE PROGRAM Access Code: [email or web address] URL: [email or web address] Date: 08/29/2023 Prepared by: [facility] - Outpatient  Rehab - [facility]  Exercises -  Supine Posterior Pelvic Tilt  - 1 x daily - 7 x weekly - 1-2 sets - 10 reps - Supine Hamstring Stretch with Strap  - 1 x daily - 5 x weekly - 2 sets - 30 sec hold - Supine Lower Trunk Rotation  - 1 x daily - 5 x weekly - 2 sets - 20 reps - Seated Anterior Pelvic Tilt  - 1 x daily - 7 x weekly - 1-2 sets - 10 reps - Wide Tandem Stance with Eyes Open  - 1 x daily - 5 x weekly - 2 sets - 30 sec hold - Standing Toe Taps  - 1 x daily - 5 x weekly - 2 sets - 10 reps - Standing Shoulder Row with Anchored Resistance  - 1 x daily - 5 x weekly - 2 sets - 10 reps     PATIENT EDUCATION: Education details: Continue current HEP  Person educated: Patient Education method: Explanation, Demonstration, Tactile cues, Verbal cues, and Handouts Education comprehension: verbalized understanding and returned demonstration     ---------------------------------------------  Note: Objective measures were completed at Evaluation unless otherwise noted.  DIAGNOSTIC FINDINGS: x-rays to be completed 07/26/2023  COGNITION: Overall cognitive status: Within functional limits for tasks assessed   SENSATION: No reports of numbness   POSTURE: rounded shoulders, forward head, and posterior pelvic tilt  LOWER EXTREMITY ROM:     Active  Right Eval Left Eval  Hip flexion    Hip extension    Hip abduction    Hip adduction    Hip internal rotation    Hip external rotation    Knee flexion    Knee extension    Ankle dorsiflexion 5 5  Ankle plantarflexion    Ankle inversion    Ankle eversion     (Blank rows = not tested)  LOWER EXTREMITY MMT:    MMT Right Eval Left Eval  Hip flexion 4+ 4+  Hip extension    Hip abduction 4 4  Hip adduction 4  4  Hip internal rotation    Hip external rotation    Knee flexion 4 4  Knee extension 4+ 4+  Ankle dorsiflexion 4 4  Ankle plantarflexion    Ankle inversion    Ankle eversion    (Blank rows = not tested)  LUMBAR: Palpation:  Tender to palpation along paraspinals thoracic spine, SI joint area, bilateral buttocks/gluts/piriformis; pt tender to palpation along IT band R>L Baseline pain in standing:  0/10 Repeated standing flexion: 3/10 pain, into bilat buttocks Repeated standing extension:1-2/10 pain centralized at mid- low back Limited in lumbar extension flexibility  Hamstring flexibility from supine 90/90:  R  -15 degrees; L -20 degrees SLR test from supine:  R 85 degrees, L 80 degrees  TRANSFERS: Assistive device utilized: None BUE support Sit to stand: Modified independence Stand to sit: Modified independence   GAIT: Gait pattern: step through pattern, antalgic, lateral lean- Right, and lateral lean- Left Distance walked: 40-50 ft Assistive device utilized: Single point cane Level of assistance: Modified independence Comments: Has rollator at home, reports less pain with gait when using rollator   PATIENT SURVEYS:  Modified Oswestry (Back Index):  48  TREATMENT DATE: 07/26/2023    PATIENT EDUCATION: Education details: Eval results, POC; Educated pt on posture/positioning with use of lumbar roll with sititng; discussed proper positioning/height of rollator; discussed pain management with prioritizing movements that centralize symptoms and lessen movements that cause symptom radiation.  Initiated HEP-see below Person educated: Patient Education method: Explanation, Demonstration, and Handouts Education comprehension: verbalized understanding, returned demonstration, and needs further education  HOME EXERCISE PROGRAM: Access Code: [email or web address] URL:  [email or web address] Date: 07/26/2023 Prepared by: [facility] - Outpatient  Rehab - [facility]  Exercises - Supine Posterior Pelvic Tilt  - 1 x daily - 7 x weekly - 1-2 sets - 10 reps - Seated Anterior Pelvic Tilt  - 1 x daily - 7 x weekly - 1-2 sets - 10 reps  GOALS: Goals reviewed with patient? Yes  SHORT TERM GOALS: Target date: 08/25/2023  Pt will be independent with HEP for improved strength, flexibility, balance, gait. Baseline: Pt denies questions on HEP 08/23/23 Goal status: MET  08/23/23  2.  Pt will report low back pain reduced by 50% during functional daily activities. Baseline:  Reports LBP is "touch and go." Reports that is it a "teeny weeny bit better" 08/24/23  Goal status: IN PROGRESS 08/24/23   3. Patient to score at least 46/56 on Berg in order to decrease risk of falls.  Baseline: 42 08/24/23 Goal status:IN PROGRESS 08/24/23  4.  Pt will verbalize understanding of fall prevention and posture/body mechanics for decreased pain/decreased fall risk.  Baseline: provided today 08/23/23  Goal status: MET 08/23/23   LONG TERM GOALS: Target date: 09/22/2023  Pt will be independent with HEP for improved strength, flexibility, pain, balance, gait. Baseline:  Goal status: IN PROGRESS  2.  Back index score to decrease to </= 40 for improved back pain and decreased disability. Baseline:  Goal status: IN PROGRESS  3.  Berg Score to improve by 10 points from baseline to demo decreased fall risk. Baseline:  Goal status:IN PROGRESS  4.  Pt will verbalize plans for continued community fitness upon d/c from PT to maximize gains in PT. Baseline:  Goal status: IN PROGRESS  ASSESSMENT:  CLINICAL IMPRESSION: Pt reports her back and raditing leg pain has been improved past couple of days.  Notes ongoing pain at left lateral ankle and tenderness to palpation at ATG lig and some at calcaneofib lig with diffuse edema surrounding. Instructed in transverse friction  massage at 2-3x/wk and ankle AROM to promote improved healing/function.  Dynamic balance to enhance balance strategies and reduce risk for falls with difficulty under narrow BOS demands. Remaining time on strengthening for posterior chain to improve core strength/coordination to prepare for functional lifting   OBJECTIVE IMPAIRMENTS: Abnormal gait, decreased balance, decreased mobility, difficulty walking, decreased ROM, decreased strength, impaired flexibility, postural dysfunction, and pain.   ACTIVITY LIMITATIONS: lifting, sitting, standing, stairs, transfers, and locomotion level  PARTICIPATION LIMITATIONS: community activity and exercise  PERSONAL FACTORS: 3+ comorbidities: see above  are also affecting patient's functional outcome.   REHAB POTENTIAL: Good  CLINICAL DECISION MAKING: Evolving/moderate complexity  EVALUATION COMPLEXITY: Moderate  PLAN:  PT FREQUENCY: 2x/week  PT DURATION: 8 weeks plus eval visit  PLANNED INTERVENTIONS: 97110-Therapeutic exercises, 97530- Therapeutic activity, [id number]- Neuromuscular re-education, 97535- Self Care, 13086- Manual therapy, [id number]- Gait training, [phone]- Aquatic Therapy, [phone]- Electrical stimulation (unattended), 97035- Ultrasound, Patient/Family education, Balance training, DME instructions, and Moist heat  PLAN FOR NEXT SESSION:  Continue balance, strengthening, ankle flexibility exercises (consider adding to HEP).   *HOLD  on Aquatics for now*.  Will need to give aquatic information (she will do this after several weeks in the clinic)  AQUATICS: Frequency: (after 3 weeks in clinic), pt to transition to 1x/wk aquatics and 1x/wk in clinic Duration: 3-5 weeks Special Instruction: flexibility, strength, balance work; pt is interested in aquatic exercise and may benefit from HEP for aquatic therapy and discussion on where to do further aquatic exercise     9:01 AM, 09/11/23 M. Shary Decamp, PT, DPT Physical Therapist- [place] Office  Number: [phone]

## 2023-09-12 ENCOUNTER — Ambulatory Visit
Payer: Medicare Other | Attending: Student in an Organized Health Care Education/Training Program | Admitting: Student in an Organized Health Care Education/Training Program

## 2023-09-12 ENCOUNTER — Encounter: Payer: Self-pay | Admitting: Student in an Organized Health Care Education/Training Program

## 2023-09-12 VITALS — BP 126/72 | HR 77 | Temp 97.7°F | Resp 16 | Ht 67.0 in | Wt 190.0 lb

## 2023-09-12 DIAGNOSIS — G894 Chronic pain syndrome: Secondary | ICD-10-CM | POA: Diagnosis not present

## 2023-09-12 DIAGNOSIS — G8929 Other chronic pain: Secondary | ICD-10-CM | POA: Diagnosis not present

## 2023-09-12 DIAGNOSIS — M47816 Spondylosis without myelopathy or radiculopathy, lumbar region: Secondary | ICD-10-CM | POA: Insufficient documentation

## 2023-09-12 MED ORDER — HYDROCODONE-ACETAMINOPHEN 5-325 MG PO TABS: 1.0000 | ORAL_TABLET | Freq: Three times a day (TID) | ORAL | 0 refills | Status: AC | PRN

## 2023-09-12 MED ORDER — BELBUCA 300 MCG BU FILM: 300.0000 ug | ORAL_FILM | Freq: Two times a day (BID) | BUCCAL | 2 refills | Status: AC

## 2023-09-12 NOTE — Progress Notes (Signed)
 PROVIDER NOTE: Interpretation of information contained herein should be left to medically-trained personnel. Specific patient instructions are provided elsewhere under "Patient Instructions" section of medical record. This document was created in part using AI and STT-dictation technology, any transcriptional errors that may result from this process are unintentional.  Patient: Brandy [name]  Service: E/M   PCP: Caesar Bookman, NP  DOB: 07-08-1965  DOS: 09/12/2023  Provider: Edward Jolly, MD  MRN: [id number]  Delivery: Face-to-face  Specialty: Interventional Pain Management  Type: Established Patient  Setting: Ambulatory outpatient facility  Specialty designation: 09  Referring Prov.: Ngetich, Donalee Citrin, NP  Location: Outpatient office facility       HPI  Brandy [name], a 58 y.o. year old female, is here today because of her Lumbar facet joint syndrome [M47.816]. Brandy [name]'s primary complain today is Back Pain (Lumbar midline ) and Eye Pain (Left eye ? From wearing CPAP mask )  Pertinent problems: Brandy [name] has Spinal stenosis, lumbar region with neurogenic claudication; Post laminectomy syndrome; CKD (chronic kidney disease), stage III (HCC); Depression; Chronic pain syndrome; DDD (degenerative disc disease), lumbosacral; Grade 1 Retrolisthesis (L4-5, L5-S1); Lumbar facet syndrome (Multilevel) (Bilateral) (R>L); Neurogenic pain; and Spondylosis without myelopathy or radiculopathy, lumbosacral region on their pertinent problem list. Pain Assessment: Severity of Chronic pain is reported as a 4 /10. Location: Back Lower, Left, Right/denies. Onset: More than a month ago. Quality: Discomfort, Sore, Constant, Tender. Timing:  . Modifying factor(s): RFA did help but the pain from fall is getting her now, rest and heating pad seem to help that.. Vitals:  height is 5\' 7"  (1.702 m) and weight is 190 lb (86.2 kg). Her temporal temperature is 97.7 F (36.5  C). Her blood pressure is 126/72 and her pulse is 77. Her respiration is 16 and oxygen saturation is 100%.  BMI: Estimated body mass index is 29.76 kg/m as calculated from the following:   Height as of this encounter: 5\' 7"  (1.702 m).   Weight as of this encounter: 190 lb (86.2 kg). Last encounter: 06/13/2023. Last procedure: 07/05/2023.  Reason for encounter: both, medication management and post-procedure evaluation and assessment.  The patient indicates doing well with the current medication regimen.  No adverse reaction or side effects reported to the medication.  Patient get 75% relief after 6-week with radiofrequency ablation (RFA) which help to improve her daily activities.    Pharmacotherapy Assessment  Analgesic: Hydrocodone - acetaminophen 5-325 mg TID for 30 days for break through pain. MME=5 Buprenorphine HCl (BELBUCA) 300 mcg Film BID.   Monitoring: [place] PMP: PDMP reviewed during this encounter.       Pharmacotherapy: No side-effects or adverse reactions reported. Compliance: No problems identified. Effectiveness: Clinically acceptable.  Vernie Ammons, RN  09/12/2023  8:40 AM  Sign when Signing Visit Nursing Pain Medication Assessment:  Safety precautions to be maintained throughout the outpatient stay will include: orient to surroundings, keep bed in low position, maintain call bell within reach at all times, provide assistance with transfer out of bed and ambulation.  Medication Inspection Compliance: Brandy [name] did not comply with our request to bring her pills to be counted. She was reminded that bringing the medication bottles, even when empty, is a requirement.  Medication: None brought in. Pill/Patch Count: None available to be counted. Bottle Appearance: No container available. Did not bring bottle(s) to appointment. Filled Date: N/A Last Medication intake:  Today  Did not bring Hydro - apap today.  No results found for: "CBDTHCR" No results  found for: "D8THCCBX" No results found for: "D9THCCBX"  UDS:  Summary  Date Value Ref Range Status  01/11/2022 Note  Final    Comment:    ==================================================================== Compliance Drug Analysis, Ur ==================================================================== Test                             Result       Flag       Units  Drug Present and Declared for Prescription Verification   Alprazolam                     15           EXPECTED   ng/mg creat   Alpha-hydroxyalprazolam        218          EXPECTED   ng/mg creat    Source of alprazolam is a scheduled prescription medication. Alpha-    hydroxyalprazolam is an expected metabolite of alprazolam.    Oxycodone                      39           EXPECTED   ng/mg creat   Oxymorphone                    317          EXPECTED   ng/mg creat   Noroxycodone                   77           EXPECTED   ng/mg creat   Noroxymorphone                 95           EXPECTED   ng/mg creat    Sources of oxycodone are scheduled prescription medications.    Oxymorphone, noroxycodone, and noroxymorphone are expected    metabolites of oxycodone. Oxymorphone is also available as a    scheduled prescription medication.    Amitriptyline                  PRESENT      EXPECTED   Nortriptyline                  PRESENT      EXPECTED    Nortriptyline is an expected metabolite of amitriptyline.  Drug Present not Declared for Prescription Verification   Desmethyldiazepam              14           UNEXPECTED ng/mg creat   Oxazepam                       64           UNEXPECTED ng/mg creat   Temazepam                      67           UNEXPECTED ng/mg creat    Desmethyldiazepam, oxazepam, and temazepam are benzodiazepine drugs,    but may also be present as common metabolites of other    benzodiazepine drugs, including diazepam.    Salicylate  PRESENT      UNEXPECTED  Drug Absent but Declared for  Prescription Verification   Buprenorphine                  Not Detected UNEXPECTED ng/mg creat    Low dose buprenorphine is not always detected even when used as    directed.    Methocarbamol                  Not Detected UNEXPECTED ==================================================================== Test                      Result    Flag   Units      Ref Range   Creatinine              175              mg/dL      >=65 ==================================================================== Declared Medications:  The flagging and interpretation on this report are based on the  following declared medications.  Unexpected results may arise from  inaccuracies in the declared medications.   **Note: The testing scope of this panel includes these medications:   Alprazolam (Xanax)  Amitriptyline (Elavil)  Methocarbamol (Robaxin)  Oxycodone (OxyIR)   **Note: The testing scope of this panel does not include small to  moderate amounts of these reported medications:   Buprenorphine (Belbuca)   **Note: The testing scope of this panel does not include the  following reported medications:   Abemaciclib (Verzenio)  Albuterol (Proventil HFA)  Atropine (Lomotil)  Diphenoxylate (Lomotil)  Eye Drops  Fluticasone (Advair)  Ketoconazole (Nizoral)  Loperamide (Imodium)  Magnesium (Mag-Ox)  Midodrine (Proamatine)  Multivitamin  Omeprazole (Prilosec)  Ondansetron (Zofran)  Rosuvastatin (Crestor)  Salmeterol (Advair)  Supplement  Triamcinolone (Kenalog)  Vitamin D2 (Drisdol) ==================================================================== For clinical consultation, please call [phone]. ====================================================================       ROS  Constitutional: Denies any fever or chills Gastrointestinal: No reported hemesis, hematochezia, vomiting, or acute GI distress Musculoskeletal: Denies any acute onset joint swelling, redness, loss of ROM, or  weakness Neurological: No reported episodes of acute onset apraxia, aphasia, dysarthria, agnosia, amnesia, paralysis, loss of coordination, or loss of consciousness  Medication Review  ALPRAZolam, Buprenorphine HCl, Evolocumab, HYDROcodone-acetaminophen, Lifitegrast, PRESCRIPTION MEDICATION, Polyethyl Glycol-Propyl Glycol, Vitamin D (Ergocalciferol), abemaciclib, acetaminophen, albuterol, amitriptyline, cyanocobalamin, diphenoxylate-atropine, fluticasone-salmeterol, ketoconazole, loperamide, methocarbamol, midodrine, multivitamin with minerals, omeprazole, and triamcinolone cream  History Review  Allergy: Brandy [name] is allergic to zithromax [azithromycin], tramadol, and psyllium. Drug: Brandy [name]  reports no history of drug use. Alcohol:  reports no history of alcohol use. Tobacco:  reports that she quit smoking about 5 years ago. Her smoking use included cigarettes. She started smoking about 30 years ago. She has a 6.3 pack-year smoking history. She has never used smokeless tobacco. Social: Brandy [name]  reports that she quit smoking about 5 years ago. Her smoking use included cigarettes. She started smoking about 30 years ago. She has a 6.3 pack-year smoking history. She has never used smokeless tobacco. She reports that she does not drink alcohol and does not use drugs. Medical:  has a past medical history of Acute pansinusitis (08/02/2017), Anxiety, Arthritis, ASD (atrial septal defect), Cancer (HCC), Cataract, Chronic pain, CKD (chronic kidney disease), Dyspnea, Fatty liver (09/04/2019), GERD (gastroesophageal reflux disease), Heart murmur, History of hiatal hernia, Hyperlipidemia, Legally blind in right eye, as defined in [place], Lumbar herniated disc, Migraines, On home oxygen therapy (06/15/2022), OSA on CPAP (06/15/2022),  PONV (postoperative nausea and vomiting), and Sciatica. Surgical: Brandy [name]  has a past surgical history that includes Breast  surgery (Bilateral, 2011); Cataract extraction; Bunionectomy; Cleft palate repair; Cardiac surgery; Ablation; transthoracic echocardiogram; Cardiac catheterization; Lumbar laminectomy/decompression microdiscectomy (Left, 05/23/2016); Lumbar laminectomy/decompression microdiscectomy (Left, 05/23/2016); Shoulder injection (Left, 05/23/2016); Reduction mammaplasty (2011); Eye surgery (Right, 2019); Tubal ligation; and Breast lumpectomy with radioactive seed and sentinel lymph node biopsy (Right, 11/23/2020). Family: family history includes Arthritis in her mother; Cancer in her father; Diabetes in her mother; High blood pressure in her mother; Hypertension in her mother.  Laboratory Chemistry Profile   Renal Lab Results  Component Value Date   BUN 11 08/28/2023   CREATININE 1.19 (H) 08/28/2023   LABCREA 123.4 10/27/2021   BCR 15 03/15/2023   GFRAA >60 02/24/2020   GFRNONAA 53 (L) 08/28/2023    Hepatic Lab Results  Component Value Date   AST 90 (H) 08/28/2023   ALT 88 (H) 08/28/2023   ALBUMIN 3.6 08/28/2023   ALKPHOS 272 (H) 08/28/2023   LIPASE 35 07/12/2021    Electrolytes Lab Results  Component Value Date   NA 143 08/28/2023   K 3.8 08/28/2023   CL 105 08/28/2023   CALCIUM 9.4 08/28/2023   MG 1.6 03/15/2023   PHOS 2.6 04/26/2022    Bone Lab Results  Component Value Date   VD25OH 30.51 06/26/2019   VD125OH2TOT 62 05/28/2021   [id number] <8 05/28/2021   [id number] 62 05/28/2021   25OHVITD1 50 11/27/2019   25OHVITD2 46 11/27/2019   25OHVITD3 4.1 11/27/2019    Inflammation (CRP: Acute Phase) (ESR: Chronic Phase) Lab Results  Component Value Date   CRP 5.6 (H) 07/28/2022   ESRSEDRATE 60 (H) 03/23/2023   LATICACIDVEN 1.5 07/23/2022         Note: Above Lab results reviewed.  Recent Imaging Review  DG Ankle Complete Left CLINICAL DATA:  Ankle pain and swelling  EXAM: LEFT ANKLE COMPLETE - 3+ VIEW  COMPARISON:  None Available.  FINDINGS: There is no evidence of  fracture, dislocation, or joint effusion. There is no evidence of arthropathy or other focal bone abnormality. Soft tissues are unremarkable.  IMPRESSION: Negative.  Electronically Signed   By: Shaaron Adler M.D.   On: 08/30/2023 16:09 Note: Reviewed        Physical Exam  General appearance: alert and Well nourished, well developed, and well hydrated. In no apparent acute distress Mental status: Alert, oriented x 3 (person, place, & time)       Respiratory: No evidence of acute respiratory distress Eyes: PERLA Vitals: BP 126/72 (BP Location: Left Arm, Patient Position: Sitting, Cuff Size: Normal)   Pulse 77   Temp 97.7 F (36.5 C) (Temporal)   Resp 16   Ht 5\' 7"  (1.702 m)   Wt 190 lb (86.2 kg)   SpO2 100%   BMI 29.76 kg/m  BMI: Estimated body mass index is 29.76 kg/m as calculated from the following:   Height as of this encounter: 5\' 7"  (1.702 m).   Weight as of this encounter: 190 lb (86.2 kg). Ideal: Ideal body weight: 61.6 kg (135 lb 12.9 oz) Adjusted ideal body weight: 71.4 kg (157 lb 7.7 oz)  Assessment   Diagnosis Status  1. Lumbar facet syndrome (Multilevel) (Bilateral) (R>L)   2. Chronic pain syndrome   3. Encounter for chronic pain management    Improved Controlled Controlled    Plan of Care  Problem-specific:  Assessment and Plan We will continue on Buprenorphine  HCl (BELBUCA) 300 mcg Film BID. Patient get 75% relief after 6-week with radiofrequency ablation (RFA) which help to improve her daily activities. Discussed repeating after July. Renew UDS for medication compliance and monitoring.  Brandy [name] has a current medication list which includes the following long-term medication(s): albuterol, amitriptyline, fluticasone-salmeterol, and omeprazole.  Pharmacotherapy (Medications Ordered): Meds ordered this encounter  Medications   Buprenorphine HCl (BELBUCA) 300 MCG FILM    Sig: Place 300 mcg inside cheek 2 (two) times daily.     Dispense:  60 each    Refill:  2   HYDROcodone-acetaminophen (NORCO/VICODIN) 5-325 MG tablet    Sig: Take 1 tablet by mouth 3 (three) times daily as needed for severe pain (pain score 7-10). Must last 30 days    Dispense:  90 tablet    Refill:  0    Chronic Pain: STOP Act (Not applicable) Fill 1 day early if closed on refill date. Avoid benzodiazepines within 8 hours of opioids   Orders:  Orders Placed This Encounter  Procedures   ToxASSURE Select 13 (MW), Urine    Volume: 30 ml(s). Minimum 3 ml of urine is needed. Document temperature of fresh sample. Indications: Long term (current) use of opiate analgesic (R60.454)    Release to patient:   Immediate   Follow-up plan:   Return in about 3 months (around 12/12/2023) for (F2F), (MM).      Recent Visits Date Type Provider Dept  07/05/23 Procedure visit Edward Jolly, MD [facility]  Showing recent visits within past 90 days and meeting all other requirements Today's Visits Date Type Provider Dept  09/12/23 Office Visit Edward Jolly, MD [facility]  Showing today's visits and meeting all other requirements Future Appointments Date Type Provider Dept  10/05/23 Appointment Edward Jolly, MD [facility]  Showing future appointments within next 90 days and meeting all other requirements  I discussed the assessment and treatment plan with the patient. The patient was provided an opportunity to ask questions and all were answered. The patient agreed with the plan and demonstrated an understanding of the instructions.  Patient advised to call back or seek an in-person evaluation if the symptoms or condition worsens.  Duration of encounter: 30 minutes.  Total time on encounter, as per AMA guidelines included both the face-to-face and non-face-to-face time personally spent by the physician and/or other qualified health care professional(s) on the day of the encounter (includes time in activities that require  the physician or other qualified health care professional and does not include time in activities normally performed by clinical staff). Physician's time may include the following activities when performed: Preparing to see the patient (e.g., pre-charting review of records, searching for previously ordered imaging, lab work, and nerve conduction tests) Review of prior analgesic pharmacotherapies. Reviewing PMP Interpreting ordered tests (e.g., lab work, imaging, nerve conduction tests) Performing post-procedure evaluations, including interpretation of diagnostic procedures Obtaining and/or reviewing separately obtained history Performing a medically appropriate examination and/or evaluation Counseling and educating the patient/family/caregiver Ordering medications, tests, or procedures Referring and communicating with other health care professionals (when not separately reported) Documenting clinical information in the electronic or other health record Independently interpreting results (not separately reported) and communicating results to the patient/ family/caregiver Care coordination (not separately reported)  Note by: Edward Jolly, MD (TTS and AI technology used. I apologize for any typographical errors that were not detected and corrected.) Date: 09/12/2023; Time: 9:42 AM

## 2023-09-12 NOTE — Patient Instructions (Signed)
 ______________________________________________________________________    Medication Rules  Purpose: To inform patients, and their family members, of our medication rules and regulations.  Applies to: All patients receiving prescriptions from our practice (written or electronic).  Pharmacy of record: This is the pharmacy where your electronic prescriptions will be sent. Make sure we have the correct one.  Electronic prescriptions: In compliance with the [facility] Strengthen Opioid Misuse Prevention (STOP) Act of 2017 (Session Conni Elliot [phone]), effective June 06, 2018, all controlled substances must be electronically prescribed. Written prescriptions, faxing, or calling prescriptions to a pharmacy will no longer be done.  Prescription refills: These will be provided only during in-person appointments. No medications will be renewed without a "face-to-face" evaluation with your provider. Applies to all prescriptions.  NOTE: The following applies primarily to controlled substances (Opioid* Pain Medications).   Type of encounter (visit): For patients receiving controlled substances, face-to-face visits are required. (Not an option and not up to the patient.)  Patient's Responsibilities: Pain Pills: Bring all pain pills to every appointment (except for procedure appointments). Pill counts are required.  Pill Bottles: Bring pills in original pharmacy bottle. Bring bottle, even if empty. Always bring the bottle of the most recent fill.  Medication refills: You are responsible for knowing and keeping track of what medications you are taking and when is it that you will need a refill. The day before your appointment: write a list of all prescriptions that need to be refilled. The day of the appointment: give the list to the admitting nurse. Prescriptions will be written only during appointments. No prescriptions will be written on procedure days. If you forget a medication: it will not be  "Called in", "Faxed", or "electronically sent". You will need to get another appointment to get these prescribed. No early refills. Do not call asking to have your prescription filled early. Partial  or short prescriptions: Occasionally your pharmacy may not have enough pills to fill your prescription.  NEVER ACCEPT a partial fill or a prescription that is short of the total amount of pills that you were prescribed.  With controlled substances the law allows 72 hours for the pharmacy to complete the prescription.  If the prescription is not completed within 72 hours, the pharmacist will require a new prescription to be written. This means that you will be short on your medicine and we WILL NOT send another prescription to complete your original prescription.  Instead, request the pharmacy to send a carrier to a nearby branch to get enough medication to provide you with your full prescription. Prescription Accuracy: You are responsible for carefully inspecting your prescriptions before leaving our office. Have the discharge nurse carefully go over each prescription with you, before taking them home. Make sure that your name is accurately spelled, that your address is correct. Check the name and dose of your medication to make sure it is accurate. Check the number of pills, and the written instructions to make sure they are clear and accurate. Make sure that you are given enough medication to last until your next medication refill appointment. Taking Medication: Take medication as prescribed. When it comes to controlled substances, taking less pills or less frequently than prescribed is permitted and encouraged. Never take more pills than instructed. Never take the medication more frequently than prescribed.  Inform other Doctors: Always inform, all of your healthcare providers, of all the medications you take. Pain Medication from other Providers: You are not allowed to accept any additional pain medication  from any  other Doctor or Healthcare provider. There are two exceptions to this rule. (see below) In the event that you require additional pain medication, you are responsible for notifying us, as stated below. Cough Medicine: Often these contain an opioid, such as codeine or hydrocodone. Never accept or take cough medicine containing these opioids if you are already taking an opioid* medication. The combination may cause respiratory failure and death. Medication Agreement: You are responsible for carefully reading and following our Medication Agreement. This must be signed before receiving any prescriptions from our practice. Safely store a copy of your signed Agreement. Violations to the Agreement will result in no further prescriptions. (Additional copies of our Medication Agreement are available upon request.) Laws, Rules, & Regulations: All patients are expected to follow all [address] and [employer], Rules, [employer]. Ignorance of the Laws does not constitute a valid excuse.  Illegal drugs and Controlled Substances: The use of illegal substances (including, but not limited to marijuana and its derivatives) and/or the illegal use of any controlled substances is strictly prohibited. Violation of this rule may result in the immediate and permanent discontinuation of any and all prescriptions being written by our practice. The use of any illegal substances is prohibited. Adopted CDC guidelines & recommendations: Target dosing levels will be at or below 60 MME/day. Use of benzodiazepines** is not recommended. Urine Drug testing: Patients taking controlled substances will be required to provide a urine sample upon request. Do not void before coming to your medication management appointments. Hold emptying your bladder until you are admitted. The admitting nurse will inform you if a sample is required. Our practice reserves the right to call you at any time to provide a sample. Once receiving the  call, you have 24 hours to comply with request. Not providing a sample upon request may result in termination of medication therapy.  Exceptions: There are only two exceptions to the rule of not receiving pain medications from other Healthcare Providers. Exception #1 (Emergencies): In the event of an emergency (i.e.: accident requiring emergency care), you are allowed to receive additional pain medication. However, you are responsible for: As soon as you are able, call our office [phone], at any time of the day or night, and leave a message stating your name, the date and nature of the emergency, and the name and dose of the medication prescribed. In the event that your call is answered by a member of our staff, make sure to document and save the date, time, and the name of the person that took your information.  Exception #2 (Planned Surgery): In the event that you are scheduled by another doctor or dentist to have any type of surgery or procedure, you are allowed (for a period no longer than 30 days), to receive additional pain medication, for the acute post-op pain. However, in this case, you are responsible for picking up a copy of our "Post-op Pain Management for Surgeons" handout, and giving it to your surgeon or dentist. This document is available at our office, and does not require an appointment to obtain it. Simply go to our office during business hours (Monday-Thursday from 8:00 AM to 4:00 PM) (Friday 8:00 AM to 12:00 Noon) or if you have a scheduled appointment with [place], prior to your surgery, and ask for it by name. In addition, you are responsible for: calling our office [phone], at any time of the day or night, and leaving a message stating your name, name of your surgeon,  type of surgery, and date of procedure or surgery. Failure to comply with your responsibilities may result in termination of therapy involving the controlled substances.  Consequences:  Non-compliance with the  above rules may result in permanent discontinuation of medication prescription therapy. All patients receiving any type of controlled substance is expected to comply with the above patient responsibilities. Not doing so may result in permanent discontinuation of medication prescription therapy. Medication Agreement Violation. Following the above rules, including your responsibilities will help you in avoiding a Medication Agreement Violation ("Breaking your Pain Medication Contract").  *Opioid medications include: morphine, codeine, oxycodone, oxymorphone, hydrocodone, hydromorphone, meperidine, tramadol, tapentadol, buprenorphine, fentanyl, methadone. **Benzodiazepine medications include: diazepam (Valium), alprazolam (Xanax), clonazepam (Klonopine), lorazepam (Ativan), clorazepate (Tranxene), chlordiazepoxide (Librium), estazolam (Prosom), oxazepam (Serax), temazepam (Restoril), triazolam (Halcion) (Last updated: 03/29/2023) ______________________________________________________________________

## 2023-09-12 NOTE — Progress Notes (Signed)
 Nursing Pain Medication Assessment:  Safety precautions to be maintained throughout the outpatient stay will include: orient to surroundings, keep bed in low position, maintain call bell within reach at all times, provide assistance with transfer out of bed and ambulation.  Medication Inspection Compliance: [name] did not comply with our request to bring her pills to be counted. She was reminded that bringing the medication bottles, even when empty, is a requirement.  Medication: None brought in. Pill/Patch Count: None available to be counted. Bottle Appearance: No container available. Did not bring bottle(s) to appointment. Filled Date: N/A Last Medication intake:  Today  Did not bring Hydro - apap today.

## 2023-09-13 ENCOUNTER — Ambulatory Visit (INDEPENDENT_AMBULATORY_CARE_PROVIDER_SITE_OTHER): Payer: Medicare Other | Admitting: Family

## 2023-09-13 ENCOUNTER — Other Ambulatory Visit: Payer: Self-pay | Admitting: Pharmacist Clinician (PhC)/ Clinical Pharmacy Specialist

## 2023-09-13 ENCOUNTER — Encounter: Payer: Self-pay | Admitting: Pharmacist Clinician (PhC)/ Clinical Pharmacy Specialist

## 2023-09-13 VITALS — BP 124/70 | HR 87 | Temp 97.8°F | Resp 20 | Ht 67.0 in | Wt 208.2 lb

## 2023-09-13 DIAGNOSIS — N1832 Chronic kidney disease, stage 3b: Secondary | ICD-10-CM | POA: Diagnosis not present

## 2023-09-13 DIAGNOSIS — E538 Deficiency of other specified B group vitamins: Secondary | ICD-10-CM | POA: Diagnosis not present

## 2023-09-13 DIAGNOSIS — E782 Mixed hyperlipidemia: Secondary | ICD-10-CM

## 2023-09-13 DIAGNOSIS — E0849 Diabetes mellitus due to underlying condition with other diabetic neurological complication: Secondary | ICD-10-CM | POA: Insufficient documentation

## 2023-09-13 DIAGNOSIS — J449 Chronic obstructive pulmonary disease, unspecified: Secondary | ICD-10-CM | POA: Diagnosis not present

## 2023-09-13 DIAGNOSIS — Z6832 Body mass index (BMI) 32.0-32.9, adult: Secondary | ICD-10-CM

## 2023-09-13 DIAGNOSIS — D72819 Decreased white blood cell count, unspecified: Secondary | ICD-10-CM

## 2023-09-13 DIAGNOSIS — F322 Major depressive disorder, single episode, severe without psychotic features: Secondary | ICD-10-CM | POA: Diagnosis not present

## 2023-09-13 DIAGNOSIS — R748 Abnormal levels of other serum enzymes: Secondary | ICD-10-CM

## 2023-09-13 DIAGNOSIS — F411 Generalized anxiety disorder: Secondary | ICD-10-CM

## 2023-09-13 DIAGNOSIS — E66811 Obesity, class 1: Secondary | ICD-10-CM

## 2023-09-13 DIAGNOSIS — C7800 Secondary malignant neoplasm of unspecified lung: Secondary | ICD-10-CM | POA: Insufficient documentation

## 2023-09-13 NOTE — Progress Notes (Signed)
 Provider: Richarda Blade FNP-C   Nyela Cortinas, Donalee Citrin, NP  Patient Care Team: Destin Vinsant, Donalee Citrin, NP as PCP - General (Family Medicine) Thomasene Ripple, DO as PCP - Cardiology (Cardiology) Elie Confer, NP as Nurse Practitioner  Extended Emergency Contact Information Primary Emergency Contact: [name] Address: [address]          [place], [place] [address] [place] of [place] Home Phone: [phone] Relation: Daughter Secondary Emergency Contact: Tucker,[name] Mobile Phone: [phone] Relation: Other Preferred language: English  Code Status:  Full Code  Goals of care: Advanced Directive information    09/13/2023    9:26 AM  Advanced Directives  Does Patient Have a Medical Advance Directive? No  Would patient like information on creating a medical advance directive? No - Patient declined     Chief Complaint  Patient presents with   Medical Management of Chronic Issues    6 month follow up and update tx agreement.     Discussed the use of AI scribe software for clinical note transcription with the patient, who gave verbal consent to proceed.  History of Present Illness   Brandy [name] is a 58 year old female who presents for a six-month follow-up visit.  She has gained weight from 205 pounds to 208.2 pounds since her last visit, attributing this to increased bread consumption. She acknowledges the need to watch her portions and increase her exercise. She enjoys exercising and mentions that her grandchildren sometimes join her.  She continues to experience pain and puffiness in her ankle, which she attributes to a previous injury. She recently saw a physical therapist who suggested it might be a strain or sprain and recommended using Biofreeze two to three times a week. The puffiness has decreased but is still present.  She has been dealing with the aftermath of her husband's death, which has been stressful. She is managing  responsibilities such as valuing her husband's car and sorting through his storage. She feels overwhelmed by her children's demands and prefers to handle things at her own pace.  She has discontinued Lomotil and Xiidra, finding Imodium and regular eye drops more effective and cost-efficient. She continues to use omeprazole for acid reflux, Robaxin for muscle relaxation, and Vicodin for breakthrough pain. She also takes Xanax for anxiety, Vazenil, and Belbuca for pain management, along with Advair, Minitran, Repatha, vitamin D, amitriptyline, magnesium, vitamin B12, albuterol, triamcinolone cream, and Tylenol as needed.  No numbness or tingling. She has seen improvements in her kidney function and hemoglobin levels. However, she notes increased liver enzymes and persistent fatigue, often needing long naps during the day. She plans to incorporate more exercise to boost her energy levels.    Past Medical History:  Diagnosis Date   Acute pansinusitis 08/02/2017   Anxiety    Arthritis    ASD (atrial septal defect)    s/p closure with Amplatzer device 10/05/04 (Dr. Celso Amy, [facility]) 10/05/04   Cancer [facility])    Cataract    Chronic pain    CKD (chronic kidney disease)    Dyspnea    Fatty liver 09/04/2019   GERD (gastroesophageal reflux disease)    Heart murmur    no longer heard   History of hiatal hernia    Hyperlipidemia    Legally blind in right eye, as defined in [place]    Lumbar herniated disc    Migraines    On home oxygen therapy 06/15/2022   Pt was given O2 on D/C from  [facility] 04/2022   OSA on CPAP 06/15/2022   PONV (postoperative nausea and vomiting)    Sciatica    Past Surgical History:  Procedure Laterality Date   ABLATION     BREAST LUMPECTOMY WITH RADIOACTIVE SEED AND SENTINEL LYMPH NODE BIOPSY Right 11/23/2020   Procedure: RIGHT BREAST LUMPECTOMY WITH RADIOACTIVE SEED AND RIGHT AXILLARY SENTINEL LYMPH NODE BIOPSY;  Surgeon: Emelia Loron, MD;  Location: [facility] OR;  Service:  General;  Laterality: Right;   BREAST SURGERY Bilateral 2011   Breast Reduction Surgery   BUNIONECTOMY     CARDIAC CATHETERIZATION     10/05/04 [facility]): LM < 25%, otherwise normal coronaries. No pulmonary HTN, Mildly enlarged RV. Secundum ASD s/p closure.   CARDIAC SURGERY     CATARACT EXTRACTION     CLEFT PALATE REPAIR     s/p cleft lip and palate repair   EYE SURGERY Right 2019   right eye removed   LUMBAR LAMINECTOMY/DECOMPRESSION MICRODISCECTOMY Left 05/23/2016   Procedure: LEFT L4-L5 LATERAL RECESS DECOMPRESSION WITH CENTRAL AND RIGHT DECOMPRESSION VIA LEFT SIDE;  Surgeon: Kerrin Champagne, MD;  Location: [facility] OR;  Service: Orthopedics;  Laterality: Left;   LUMBAR LAMINECTOMY/DECOMPRESSION MICRODISCECTOMY Left 05/23/2016   Procedure: LUMBAR LAMINECTOMY/DECOMPRESSION MICRODISCECTOMY Lumbar five - Sacral One 1 LEVEL;  Surgeon: Kerrin Champagne, MD;  Location: [facility] OR;  Service: Orthopedics;  Laterality: Left;   REDUCTION MAMMAPLASTY  2011   SHOULDER INJECTION Left 05/23/2016   Procedure: SHOULDER INJECTION;  Surgeon: Kerrin Champagne, MD;  Location: [facility] OR;  Service: Orthopedics;  Laterality: Left;  band-aid per pa-c   TRANSTHORACIC ECHOCARDIOGRAM     12/15/05 [name]): Mild LVH, EF > 55%, grade 1 diastolic dysfunction, Trivial MR/PR/TR.   TUBAL LIGATION      Allergies  Allergen Reactions   Zithromax [Azithromycin] Shortness Of Breath, Itching and Other (See Comments)    TOTAL BODY ITCHING [EVEN SOLES OF FEET] WHEEZING    Tramadol Itching and Other (See Comments)    Has taken recently without any side effects.   Psyllium Nausea And Vomiting and Other (See Comments)    Metamucil. Sneezing      Allergies as of 09/13/2023       Reactions   Zithromax [azithromycin] Shortness Of Breath, Itching, Other (See Comments)   TOTAL BODY ITCHING [EVEN SOLES OF FEET] WHEEZING   Tramadol Itching, Other (See Comments)   Has taken recently without any side effects.   Psyllium Nausea And Vomiting, Other (See  Comments)   Metamucil. Sneezing         Medication List        Accurate as of September 13, 2023 10:20 AM. If you have any questions, ask your nurse or doctor.          STOP taking these medications    diphenoxylate-atropine 2.5-0.025 MG tablet Commonly known as: LOMOTIL Stopped by: Donalee Citrin Benjaman Artman   Xiidra 5 % Soln Generic drug: Lifitegrast Stopped by: Donalee Citrin Kaelin Holford       TAKE these medications    acetaminophen 500 MG tablet Commonly known as: TYLENOL Take 500 mg by mouth daily as needed for moderate pain.   albuterol 108 (90 Base) MCG/ACT inhaler Commonly known as: VENTOLIN HFA Inhale 2 puffs into the lungs every 6 (six) hours as needed for wheezing or shortness of breath.   ALPRAZolam 1 MG tablet Commonly known as: XANAX TAKE 1 TABLET BY MOUTH AT  BEDTIME AS NEEDED FOR ANXIETY   amitriptyline 75 MG tablet Commonly known  as: ELAVIL Take 1 tablet (75 mg total) by mouth at bedtime.   Belbuca 300 MCG Film Generic drug: Buprenorphine HCl Place 300 mcg inside cheek 2 (two) times daily. Start taking on: September 22, 2023   cyanocobalamin 500 MCG tablet Commonly known as: VITAMIN B12 Take 1 tablet (500 mcg total) by mouth daily.   fluticasone-salmeterol 250-50 MCG/ACT Aepb Commonly known as: ADVAIR Inhale 1 puff into the lungs in the morning and at bedtime.   HYDROcodone-acetaminophen 5-325 MG tablet Commonly known as: NORCO/VICODIN Take 1 tablet by mouth 3 (three) times daily as needed for severe pain (pain score 7-10). Must last 30 days   ketoconazole 2 % cream Commonly known as: NIZORAL Apply 1 Application topically 2 (two) times daily.   loperamide 2 MG capsule Commonly known as: IMODIUM Take 1-2 capsules (2-4 mg total) by mouth 4 (four) times daily as needed for diarrhea or loose stools.   LUBRICATING EYE DROPS OP Place 1 drop into the right eye in the morning, at noon, and at bedtime.   methocarbamol 500 MG tablet Commonly known as: ROBAXIN Take  1 tablet (500 mg total) by mouth every 6 (six) hours as needed for muscle spasms.   midodrine 10 MG tablet Commonly known as: PROAMATINE TAKE 1 TABLET BY MOUTH IN THE  MORNING AND AT BEDTIME   multivitamin with minerals Tabs tablet Take 1 tablet by mouth daily.   omeprazole 20 MG tablet Commonly known as: PRILOSEC OTC Take 20 mg by mouth daily.   PRESCRIPTION MEDICATION CPAP- At bedtime   Repatha SureClick 140 MG/ML Soaj Generic drug: Evolocumab Inject 140 mg into the skin every 14 (fourteen) days.   triamcinolone cream 0.1 % Commonly known as: KENALOG Apply 1 Application topically 2 (two) times daily.   Verzenio 100 MG tablet Generic drug: abemaciclib TAKE 1 TABLET BY MOUTH TWICE DAILY   Vitamin D (Ergocalciferol) 1.25 MG (50000 UNIT) Caps capsule Commonly known as: DRISDOL TAKE 1 CAPSULE BY MOUTH EVERY 7  DAYS ( MONDAY )        Review of Systems  Constitutional:  Negative for appetite change, chills, fatigue, fever and unexpected weight change.  HENT:  Negative for congestion, dental problem, ear discharge, ear pain, facial swelling, hearing loss, nosebleeds, postnasal drip, rhinorrhea, sinus pressure, sinus pain, sneezing, sore throat, tinnitus and trouble swallowing.   Eyes:  Positive for visual disturbance. Negative for pain, discharge, redness and itching.       Left eye prosthetic   Respiratory:  Negative for cough, chest tightness, shortness of breath and wheezing.   Cardiovascular:  Negative for chest pain, palpitations and leg swelling.  Gastrointestinal:  Negative for abdominal distention, abdominal pain, blood in stool, constipation, diarrhea, nausea and vomiting.  Endocrine: Negative for cold intolerance, heat intolerance, polydipsia, polyphagia and polyuria.  Genitourinary:  Negative for difficulty urinating, dysuria, flank pain, frequency and urgency.  Musculoskeletal:  Positive for arthralgias, back pain and gait problem. Negative for joint swelling,  myalgias, neck pain and neck stiffness.  Skin:  Negative for color change, pallor, rash and wound.  Neurological:  Negative for dizziness, syncope, speech difficulty, weakness, light-headedness, numbness and headaches.  Hematological:  Does not bruise/bleed easily.  Psychiatric/Behavioral:  Negative for agitation, behavioral problems, confusion, hallucinations, self-injury, sleep disturbance and suicidal ideas. The patient is not nervous/anxious.        Still grieving loss of husband reports no worsening depression     Immunization History  Administered Date(s) Administered   Hepatitis A 03/02/2023  Hepatitis B 03/02/2023   Influenza Inj Mdck Quad Pf 04/25/2019   Influenza,inj,Quad PF,6+ Mos 02/24/2020, 04/15/2022, 02/21/2023   Influenza,inj,quad, With Preservative 04/25/2019   Influenza-Unspecified 05/26/2021   Moderna Sars-Covid-2 Vaccination 06/29/2019, 08/03/2019, 06/24/2020   PFIZER(Purple Top)SARS-COV-2 Vaccination 05/26/2021   Pneumococcal Conjugate-13 05/29/2019   Pneumococcal Polysaccharide-23 07/29/2019   Tdap 02/21/2023   Unspecified SARS-COV-2 Vaccination 02/21/2023   Zoster Recombinant(Shingrix) 02/17/2020, 04/20/2020   Pertinent  Health Maintenance Due  Topic Date Due   FOOT EXAM  Never done   OPHTHALMOLOGY EXAM  Never done   HEMOGLOBIN A1C  01/24/2023   INFLUENZA VACCINE  01/05/2024   MAMMOGRAM  09/13/2024   Colonoscopy  08/16/2027      07/05/2023    8:32 AM 07/20/2023   10:38 AM 08/17/2023   10:42 AM 09/12/2023    8:20 AM 09/13/2023    9:25 AM  Fall Risk  Falls in the past year? 1 1 0 1 1  Was there an injury with Fall? 0 0 0 0 0  Was there an injury with Fall? - Comments    other than bumps and bruises and feeling sore.  slipped on the ice in January   Fall Risk Category Calculator 2 2 0 2 1  Patient at Risk for Falls Due to   No Fall Risks History of fall(s) History of fall(s)  Fall risk Follow up   Falls evaluation completed Falls evaluation  completed;Education provided Falls evaluation completed   Functional Status Survey:    Vitals:   09/13/23 0932  BP: 124/70  Pulse: 87  Resp: 20  Temp: 97.8 F (36.6 C)  SpO2: 99%  Weight: 208 lb 3.2 oz (94.4 kg)  Height: 5\' 7"  (1.702 m)   Body mass index is 32.61 kg/m. Physical Exam VITALS: T- 97.8, P- 87, BP- 124/70, SaO2- 99% MEASUREMENTS: Weight- 208.2. GENERAL: Alert, cooperative, well developed, no acute distress. HEENT: Normocephalic, normal oropharynx, moist mucous membranes.Left eye prosthetic  CHEST: Clear to auscultation bilaterally, no wheezes, rhonchi, or crackles. CARDIOVASCULAR: Normal heart rate and rhythm, S1 and S2 normal without murmurs. ABDOMEN: Soft, non-tender, non-distended, without organomegaly, normal bowel sounds. EXTREMITIES: No cyanosis or edema. MUSCULOSKELETAL: No tenderness in the back. NEUROLOGICAL: Cranial nerves grossly intact, moves all extremities without gross motor or sensory deficit.  SKIN: No rash,no lesion or erythema   PSYCHIATRY/BEHAVIORAL: Mood stable    Labs reviewed: Recent Labs    03/15/23 0927 04/03/23 1311 06/23/23 0811 07/21/23 0804 08/28/23 1352  NA 141   < > 139 140 143  K 4.2   < > 4.0 4.0 3.8  CL 102   < > 102 104 105  CO2 30   < > 28 29 31   GLUCOSE 84   < > 114* 97 104*  BUN 18   < > 25* 17 11  CREATININE 1.24*   < > 1.29* 1.43* 1.19*  CALCIUM 9.4   < > 9.4 9.3 9.4  MG 1.6  --   --   --   --    < > = values in this interval not displayed.   Recent Labs    06/23/23 0811 07/21/23 0804 08/28/23 1352  AST 73* 67* 90*  ALT 72* 57* 88*  ALKPHOS 236* 236* 272*  BILITOT 1.0 0.8 0.5  PROT 7.4 6.8 7.0  ALBUMIN 3.8 3.5 3.6   Recent Labs    06/23/23 0811 07/21/23 0804 08/28/23 1352  WBC 4.6 4.2 3.1*  NEUTROABS 2.0 1.3* 1.1*  HGB 11.8* 11.4* 12.3  HCT 34.9* 34.0* 37.2  MCV 105.8* 107.6* 107.5*  PLT 215 191 198   Lab Results  Component Value Date   TSH 1.95 03/15/2023   Lab Results  Component  Value Date   HGBA1C 5.3 07/26/2022   Lab Results  Component Value Date   CHOL 356 (H) 05/30/2023   HDL 122 05/30/2023   LDLCALC 189 (H) 05/30/2023   TRIG 243 (H) 05/30/2023   CHOLHDL 2.9 05/30/2023    Significant Diagnostic Results in last 30 days:  DG Ankle Complete Left Result Date: 08/30/2023 CLINICAL DATA:  Ankle pain and swelling EXAM: LEFT ANKLE COMPLETE - 3+ VIEW COMPARISON:  None Available. FINDINGS: There is no evidence of fracture, dislocation, or joint effusion. There is no evidence of arthropathy or other focal bone abnormality. Soft tissues are unremarkable. IMPRESSION: Negative. Electronically Signed   By: Shaaron Adler M.D.   On: 08/30/2023 16:09    Assessment/Plan  Ankle pain Persistent ankle pain with puffiness, likely due to a strain or sprain. Biofreeze and massage, as advised by a physical therapist, have reduced puffiness. - Continue Biofreeze and massage as advised by the physical therapist  Chronic pain management Chronic pain managed with Vicodin for breakthrough pain, Robaxin as a muscle relaxant, and Belbuca for pain management. She is aware of the need to avoid excessive use of medications that can cause drowsiness and affect breathing, especially at bedtime. - Continue Vicodin for breakthrough pain as needed - Continue Robaxin as a muscle relaxant - Continue Belbuca for pain management - Monitor for side effects and adjust medication use as needed  Obesity - BMI 32.61  Weight increased to 208.2 lbs from 205 lbs, attributed to increased bread consumption. Emphasized portion control and regular exercise. - Advise portion control with bread consumption - Encourage regular exercise  Chronic kidney disease stage 3 Kidney function slightly improved, with creatinine levels decreasing from 1.43 to 1.19. Advised to increase water intake, especially with exercise. - Monitor kidney function - Encourage increased water intake  Liver enzyme elevation Elevated  liver enzymes (AST, ALT, and alkaline phosphatase). She denies alcohol consumption. - Recheck liver enzymes  Low white blood cell count Persistently low white blood cell count, likely related to cancer. Recommended vitamin C supplementation. - Start vitamin C supplementation - Continue vitamin B12 supplementation - continue to follow up with oncologist   Generalized Anxiety disorder  Anxiety managed with Xanax 1 mg. Following up with Dr. Ralene Cork. - Continue Xanax 1 mg - Follow up with Dr. Ralene Cork   Hyperlipidemia  - dietary modification and exercise at least 30 minutes three times per week  - continue on Repatha  - continue to follow up with Cardiologist    Family/ staff Communication: Reviewed plan of care with patient verbalized understanding   Labs/tests ordered:  - Magnesium  - Hepatic panel  - TSH - Hgb A1C - Lipid panel - Vitamin B 12   Next Appointment : Return in about 6 months (around 03/14/2024) for medical mangement of chronic issues. Pap smear soon .   Spent 30 minutes of Face to face and non-face to face with patient  >50% time spent counseling; reviewing medical record; tests; labs; documentation and developing future plan of care.   Caesar Bookman, NP

## 2023-09-14 ENCOUNTER — Ambulatory Visit

## 2023-09-14 DIAGNOSIS — R293 Abnormal posture: Secondary | ICD-10-CM | POA: Diagnosis not present

## 2023-09-14 DIAGNOSIS — M6281 Muscle weakness (generalized): Secondary | ICD-10-CM | POA: Diagnosis not present

## 2023-09-14 DIAGNOSIS — R2689 Other abnormalities of gait and mobility: Secondary | ICD-10-CM | POA: Diagnosis not present

## 2023-09-14 DIAGNOSIS — M5459 Other low back pain: Secondary | ICD-10-CM

## 2023-09-14 DIAGNOSIS — R262 Difficulty in walking, not elsewhere classified: Secondary | ICD-10-CM | POA: Diagnosis not present

## 2023-09-14 DIAGNOSIS — R2681 Unsteadiness on feet: Secondary | ICD-10-CM

## 2023-09-14 LAB — HEPATIC FUNCTION PANEL
AG Ratio: 1.2 (calc) (ref 1.0–2.5)
ALT: 51 U/L — ABNORMAL HIGH (ref 6–29)
AST: 59 U/L — ABNORMAL HIGH (ref 10–35)
Albumin: 3.7 g/dL (ref 3.6–5.1)
Alkaline phosphatase (APISO): 282 U/L — ABNORMAL HIGH (ref 37–153)
Bilirubin, Direct: 0.3 mg/dL — ABNORMAL HIGH (ref 0.0–0.2)
Globulin: 3 g/dL (ref 1.9–3.7)
Indirect Bilirubin: 0.4 mg/dL (ref 0.2–1.2)
Total Bilirubin: 0.7 mg/dL (ref 0.2–1.2)
Total Protein: 6.7 g/dL (ref 6.1–8.1)

## 2023-09-14 LAB — COMPLETE METABOLIC PANEL WITHOUT GFR
AG Ratio: 1.2 (calc) (ref 1.0–2.5)
ALT: 51 U/L — ABNORMAL HIGH (ref 6–29)
AST: 59 U/L — ABNORMAL HIGH (ref 10–35)
Albumin: 3.7 g/dL (ref 3.6–5.1)
Alkaline phosphatase (APISO): 282 U/L — ABNORMAL HIGH (ref 37–153)
BUN/Creatinine Ratio: 10 (calc) (ref 6–22)
BUN: 11 mg/dL (ref 7–25)
CO2: 29 mmol/L (ref 20–32)
Calcium: 9 mg/dL (ref 8.6–10.4)
Chloride: 102 mmol/L (ref 98–110)
Creat: 1.05 mg/dL — ABNORMAL HIGH (ref 0.50–1.03)
Globulin: 3 g/dL (ref 1.9–3.7)
Glucose, Bld: 92 mg/dL (ref 65–99)
Potassium: 3.9 mmol/L (ref 3.5–5.3)
Sodium: 143 mmol/L (ref 135–146)
Total Bilirubin: 0.7 mg/dL (ref 0.2–1.2)
Total Protein: 6.7 g/dL (ref 6.1–8.1)

## 2023-09-14 LAB — LIPID PANEL
Cholesterol: 304 mg/dL — ABNORMAL HIGH (ref <200)
HDL: 83 mg/dL (ref >=50)
LDL Cholesterol (Calc): 169 mg/dL — ABNORMAL HIGH
Non-HDL Cholesterol (Calc): 221 mg/dL — ABNORMAL HIGH (ref <130)
Total CHOL/HDL Ratio: 3.7 (calc) (ref <5.0)
Triglycerides: 290 mg/dL — ABNORMAL HIGH (ref <150)

## 2023-09-14 LAB — TSH: TSH: 1.26 m[IU]/L (ref 0.40–4.50)

## 2023-09-14 LAB — MAGNESIUM: Magnesium: 1.1 mg/dL — ABNORMAL LOW (ref 1.5–2.5)

## 2023-09-14 LAB — VITAMIN B12: Vitamin B-12: 1338 pg/mL — ABNORMAL HIGH (ref 200–1100)

## 2023-09-14 NOTE — Therapy (Signed)
 OUTPATIENT PHYSICAL THERAPY NEURO TREATMENT   Patient Name: Brandy [name] MRN: [id number] DOB:1966/01/27, 58 y.o., female Today's Date: 09/14/2023   PCP: Caesar Bookman, NP  REFERRING PROVIDER: Caesar Bookman, NP   END OF SESSION:  PT End of Session - 09/14/23 0829     Visit Number 9    Number of Visits 17    Date for PT Re-Evaluation 09/22/23    Authorization Type [employer] Medicare    Authorization Time Period approved 17 PT visits from 07/26/2023-09/20/2023    Authorization - Visit Number 9    Authorization - Number of Visits 17    Progress Note Due on Visit 10    PT Start Time 0830    PT Stop Time 0915    PT Time Calculation (min) 45 min    Equipment Utilized During Treatment Gait belt    Activity Tolerance Patient tolerated treatment well    Behavior During Therapy WFL for tasks assessed/performed                   Past Medical History:  Diagnosis Date   Acute pansinusitis 08/02/2017   Anxiety    Arthritis    ASD (atrial septal defect)    s/p closure with Amplatzer device 10/05/04 (Dr. Celso Amy, [facility]) 10/05/04   Cancer [facility])    Cataract    Chronic pain    CKD (chronic kidney disease)    Dyspnea    Fatty liver 09/04/2019   GERD (gastroesophageal reflux disease)    Heart murmur    no longer heard   History of hiatal hernia    Hyperlipidemia    Legally blind in right eye, as defined in [place]    Lumbar herniated disc    Migraines    On home oxygen therapy 06/15/2022   Pt was given O2 on D/C from [facility] 04/2022   OSA on CPAP 06/15/2022   PONV (postoperative nausea and vomiting)    Sciatica    Past Surgical History:  Procedure Laterality Date   ABLATION     BREAST LUMPECTOMY WITH RADIOACTIVE SEED AND SENTINEL LYMPH NODE BIOPSY Right 11/23/2020   Procedure: RIGHT BREAST LUMPECTOMY WITH RADIOACTIVE SEED AND RIGHT AXILLARY SENTINEL LYMPH NODE BIOPSY;  Surgeon: Emelia Loron, MD;  Location: [facility] OR;  Service: General;  Laterality:  Right;   BREAST SURGERY Bilateral 2011   Breast Reduction Surgery   BUNIONECTOMY     CARDIAC CATHETERIZATION     10/05/04 [facility]): LM < 25%, otherwise normal coronaries. No pulmonary HTN, Mildly enlarged RV. Secundum ASD s/p closure.   CARDIAC SURGERY     CATARACT EXTRACTION     CLEFT PALATE REPAIR     s/p cleft lip and palate repair   EYE SURGERY Right 2019   right eye removed   LUMBAR LAMINECTOMY/DECOMPRESSION MICRODISCECTOMY Left 05/23/2016   Procedure: LEFT L4-L5 LATERAL RECESS DECOMPRESSION WITH CENTRAL AND RIGHT DECOMPRESSION VIA LEFT SIDE;  Surgeon: Kerrin Champagne, MD;  Location: [facility] OR;  Service: Orthopedics;  Laterality: Left;   LUMBAR LAMINECTOMY/DECOMPRESSION MICRODISCECTOMY Left 05/23/2016   Procedure: LUMBAR LAMINECTOMY/DECOMPRESSION MICRODISCECTOMY Lumbar five - Sacral One 1 LEVEL;  Surgeon: Kerrin Champagne, MD;  Location: [facility] OR;  Service: Orthopedics;  Laterality: Left;   REDUCTION MAMMAPLASTY  2011   SHOULDER INJECTION Left 05/23/2016   Procedure: SHOULDER INJECTION;  Surgeon: Kerrin Champagne, MD;  Location: [facility] OR;  Service: Orthopedics;  Laterality: Left;  band-aid per pa-c   TRANSTHORACIC ECHOCARDIOGRAM  12/15/05 [facility]): Mild LVH, EF > 55%, grade 1 diastolic dysfunction, Trivial MR/PR/TR.   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 (HCC) 09/13/2023   Chronic obstructive pulmonary disease, unspecified COPD type (HCC) 03/15/2023   Major depressive disorder, single episode, severe without psychotic features (HCC) 03/15/2023   Orthostatic hypotension 04/24/2022   Hyperbilirubinemia 04/24/2022   Snoring 07/14/2021   Fatigue 07/14/2021   Daytime somnolence 07/14/2021   Status post device closure of ASD 06/03/2021   Secundum ASD 06/03/2021   Decreased diffusion capacity 05/14/2021   Dyspnea 03/04/2021    Pulmonary nodules 04/01/2020   Primary malignant neoplasm of breast with metastasis (HCC) 02/24/2020   Spondylosis without myelopathy or radiculopathy, lumbosacral region 12/31/2019   Chronic low back pain (Bilateral) w/o sciatica 12/31/2019   Chronic hip pain (3ry area of Pain) (Bilateral) (R>L) 11/27/2019   Chronic sacroiliac joint pain (Left) 11/27/2019   Chronic pain syndrome 11/11/2019   Encounter for chronic pain management 11/11/2019   Disorder of skeletal system 11/11/2019   Problems influencing health status 11/11/2019   Abnormal MRI, lumbar spine (08/22/2018) 11/11/2019   Chronic low back pain (1ry area of Pain) (Bilateral) w/ sciatica (Bilateral) 11/11/2019   DDD (degenerative disc disease), lumbosacral 11/11/2019   Grade 1 Anterolisthesis (L2-3, L3-4) 11/11/2019   Grade 1 Retrolisthesis (L4-5, L5-S1) 11/11/2019   Lumbar facet hypertrophy (Multilevel) (Bilateral) 11/11/2019   Lumbar facet syndrome (Multilevel) (Bilateral) (R>L) 11/11/2019   Chronic lower extremity pain (2ry area of Pain) (Bilateral) (R>L) 11/11/2019   Neurogenic pain 11/11/2019   Chronic musculoskeletal pain 11/11/2019   Infiltrating ductal carcinoma of breast (HCC) 10/08/2019   Fatty liver 09/04/2019   Obesity (BMI 30-39.9) 09/04/2019   GERD (gastroesophageal reflux disease)    Hyperlipidemia    CKD (chronic kidney disease), stage III (HCC)    Depression    Malignant hypertension    Ocular proptosis 02/14/2019   PVD (posterior vitreous detachment), left eye 02/14/2019   Chorioretinal scar of left eye 02/14/2019   Vitreous floaters of left eye 02/14/2019   Bilateral leg weakness 01/24/2019   Insomnia 08/02/2017   Low back pain radiating to both legs 08/02/2017   Migraine 08/02/2017   Neuropathy 08/02/2017   Spinal stenosis, 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    Class: Chronic   Left shoulder tendonitis 05/23/2016    Class: Acute   Post laminectomy syndrome 05/23/2016   Anxiety  disorder 01/07/2015    ONSET DATE: 07/20/2023 (MD referral)  REFERRING DIAG:  M54.16 (ICD-10-CM) - Lumbar back pain with radiculopathy affecting left lower extremity  R26.81 (ICD-10-CM) - Unsteady gait  R29.6 (ICD-10-CM) - Frequent falls    THERAPY DIAG:  Unsteadiness on feet  Other abnormalities of gait and mobility  Muscle weakness (generalized)  Other low back pain  Abnormal posture  Rationale for Evaluation and Treatment: Rehabilitation  SUBJECTIVE:  SUBJECTIVE STATEMENT: Not having any pain at present. No radiating pain right now. Ankle is not as puffy either   Pt accompanied by: self  PERTINENT HISTORY: Per MD:  She has been experiencing worsening back pain following a fall last month during a snowstorm. She slipped on ice and fell, initially without pain, but began experiencing consistent pain about a week later. The pain is primarily located in the lower back, radiating more to the left side, with tenderness in the buttocks and back of the thigh. On February 9th, she experienced severe pain radiating from her left foot up her leg, causing her to fall back into bed. No current pain radiating down the leg, weakness, tingling or numbness. No swelling or bruising in the affected areas. She has a history of back pain and underwent a nerve ablation procedure at the end of January to manage her symptoms.  PMH:  breast cancer with metastasis;  Lumbar laminectomy/decompression microdiscectomy (Left, 05/23/2016); Lumbar laminectomy/decompression microdiscectomy (Left, 05/23/2016); Shoulder injection (Left, 05/23/2016); Reduction mammaplasty (2011); Eye surgery (Right, 2019); Tubal ligation; and Breast lumpectomy with radioactive seed and sentinel lymph node biopsy (Right, 11/23/2020).   PAIN:  No c/o  pain 09/07/2023 in back or ankle  PRECAUTIONS: Fall; decreased vision Per MD note:  She has issues with her eyes, specifically dry eyes, requiring the use of artificial tears every hour. She has a prosthetic eye (R) and uses lubricant and tapes her left eyelid shut at night to prevent dryness. Her vision can be distorted at times, affecting her daily activities.   RED FLAGS: None   WEIGHT BEARING RESTRICTIONS: No  FALLS: Has patient fallen in last 6 months? Yes. Number of falls at least 3 falls  LIVING ENVIRONMENT: Lives with: lives with their spouse Lives in: House/apartment Stairs:  6 steps, multiple steps -getting ready to move into apartment where there are no steps Has following equipment at home: Single point cane and Walker - 4 wheeled  PLOF: Independent with household mobility with device and Independent with community mobility with device  PATIENT GOALS: Pt's goals for therapy become painfree, more independent, exercise more  OBJECTIVE:   TODAY'S TREATMENT: 09/14/23 Activity Comments  Seated row 1x15 10# 3x10 20#  Seated lat row 3x10 35#-assist for grip  Stiff arm pull down 3x10 20#,   Paloff press 1x10 15#  LTR x 2 min   Hip flexion isometric 2x10   SKTC 1x10 deep breaths Cues for technique   DKTC 1x10 deep breaths          HOME EXERCISE PROGRAM Access Code: [email or web address] URL: [email or web address] Date: 08/29/2023 Prepared by: [facility] - Outpatient  Rehab - [facility]  Exercises - Supine Posterior Pelvic Tilt  - 1 x daily - 7 x weekly - 1-2 sets - 10 reps - Supine Hamstring Stretch with Strap  - 1 x daily - 5 x weekly - 2 sets - 30 sec hold - Supine Lower Trunk Rotation  - 1 x daily - 5 x weekly - 2 sets - 20 reps - Seated Anterior Pelvic Tilt  - 1 x daily - 7 x weekly - 1-2 sets - 10 reps - Wide Tandem Stance with Eyes Open  - 1 x daily - 5 x weekly - 2 sets - 30 sec hold - Standing Toe Taps  - 1 x daily - 5 x weekly - 2 sets - 10 reps -  Standing Shoulder Row with Anchored Resistance  - 1 x daily - 5 x weekly -  2 sets - 10 reps - Standing Anti-Rotation Press with Anchored Resistance  - 1 x daily - 7 x weekly - 3 sets - 10 reps - 2 sec hold - Hooklying Isometric Hip Flexion with Opposite Arm  - 1 x daily - 7 x weekly - 3 sets - 10 reps - 2 sec hold    PATIENT EDUCATION: Education details: Continue current HEP  Person educated: Patient Education method: Explanation, Demonstration, Tactile cues, Verbal cues, and Handouts Education comprehension: verbalized understanding and returned demonstration     ---------------------------------------------  Note: Objective measures were completed at Evaluation unless otherwise noted.  DIAGNOSTIC FINDINGS: x-rays to be completed 07/26/2023  COGNITION: Overall cognitive status: Within functional limits for tasks assessed   SENSATION: No reports of numbness   POSTURE: rounded shoulders, forward head, and posterior pelvic tilt  LOWER EXTREMITY ROM:     Active  Right Eval Left Eval  Hip flexion    Hip extension    Hip abduction    Hip adduction    Hip internal rotation    Hip external rotation    Knee flexion    Knee extension    Ankle dorsiflexion 5 5  Ankle plantarflexion    Ankle inversion    Ankle eversion     (Blank rows = not tested)  LOWER EXTREMITY MMT:    MMT Right Eval Left Eval  Hip flexion 4+ 4+  Hip extension    Hip abduction 4 4  Hip adduction 4 4  Hip internal rotation    Hip external rotation    Knee flexion 4 4  Knee extension 4+ 4+  Ankle dorsiflexion 4 4  Ankle plantarflexion    Ankle inversion    Ankle eversion    (Blank rows = not tested)  LUMBAR: Palpation:  Tender to palpation along paraspinals thoracic spine, SI joint area, bilateral buttocks/gluts/piriformis; pt tender to palpation along IT band R>L Baseline pain in standing:  0/10 Repeated standing flexion: 3/10 pain, into bilat buttocks Repeated standing extension:1-2/10  pain centralized at mid- low back Limited in lumbar extension flexibility  Hamstring flexibility from supine 90/90:  R  -15 degrees; L -20 degrees SLR test from supine:  R 85 degrees, L 80 degrees  TRANSFERS: Assistive device utilized: None BUE support Sit to stand: Modified independence Stand to sit: Modified independence   GAIT: Gait pattern: step through pattern, antalgic, lateral lean- Right, and lateral lean- Left Distance walked: 40-50 ft Assistive device utilized: Single point cane Level of assistance: Modified independence Comments: Has rollator at home, reports less pain with gait when using rollator   PATIENT SURVEYS:  Modified Oswestry (Back Index):  48                                                                                                                               TREATMENT DATE: 07/26/2023    PATIENT EDUCATION: Education details: Eval results, POC; Educated pt on posture/positioning with  use of lumbar roll with sititng; discussed proper positioning/height of rollator; discussed pain management with prioritizing movements that centralize symptoms and lessen movements that cause symptom radiation.  Initiated HEP-see below Person educated: Patient Education method: Explanation, Demonstration, and Handouts Education comprehension: verbalized understanding, returned demonstration, and needs further education  HOME EXERCISE PROGRAM: Access Code: [email or web address] URL: [email or web address] Date: 07/26/2023 Prepared by: [facility] - Outpatient  Rehab - [facility]  Exercises - Supine Posterior Pelvic Tilt  - 1 x daily - 7 x weekly - 1-2 sets - 10 reps - Seated Anterior Pelvic Tilt  - 1 x daily - 7 x weekly - 1-2 sets - 10 reps  GOALS: Goals reviewed with patient? Yes  SHORT TERM GOALS: Target date: 08/25/2023  Pt will be independent with HEP for improved strength, flexibility, balance, gait. Baseline: Pt denies questions on HEP 08/23/23 Goal  status: MET  08/23/23  2.  Pt will report low back pain reduced by 50% during functional daily activities. Baseline:  Reports LBP is "touch and go." Reports that is it a "teeny weeny bit better" 08/24/23  Goal status: IN PROGRESS 08/24/23   3. Patient to score at least 46/56 on Berg in order to decrease risk of falls.  Baseline: 42 08/24/23 Goal status:IN PROGRESS 08/24/23  4.  Pt will verbalize understanding of fall prevention and posture/body mechanics for decreased pain/decreased fall risk.  Baseline: provided today 08/23/23  Goal status: MET 08/23/23   LONG TERM GOALS: Target date: 09/22/2023  Pt will be independent with HEP for improved strength, flexibility, pain, balance, gait. Baseline:  Goal status: IN PROGRESS  2.  Back index score to decrease to </= 40 for improved back pain and decreased disability. Baseline:  Goal status: IN PROGRESS  3.  Berg Score to improve by 10 points from baseline to demo decreased fall risk. Baseline:  Goal status:IN PROGRESS  4.  Pt will verbalize plans for continued community fitness upon d/c from PT to maximize gains in PT. Baseline:  Goal status: IN PROGRESS  ASSESSMENT:  CLINICAL IMPRESSION: Focus on compound strengthening for posterior chain to improve trunk strength/stability. Engaged in additional trunk strength for HEP and flexion-biased stretching to improve flexibility. Good tolerance with minimal rest periods requied. Continued sessions to advance POC details to improve mobility and balance  OBJECTIVE IMPAIRMENTS: Abnormal gait, decreased balance, decreased mobility, difficulty walking, decreased ROM, decreased strength, impaired flexibility, postural dysfunction, and pain.   ACTIVITY LIMITATIONS: lifting, sitting, standing, stairs, transfers, and locomotion level  PARTICIPATION LIMITATIONS: community activity and exercise  PERSONAL FACTORS: 3+ comorbidities: see above  are also affecting patient's functional outcome.   REHAB  POTENTIAL: Good  CLINICAL DECISION MAKING: Evolving/moderate complexity  EVALUATION COMPLEXITY: Moderate  PLAN:  PT FREQUENCY: 2x/week  PT DURATION: 8 weeks plus eval visit  PLANNED INTERVENTIONS: 97110-Therapeutic exercises, 97530- Therapeutic activity, [id number]- Neuromuscular re-education, 97535- Self Care, [id number]- Manual therapy, [id number]- Gait training, [phone]- Aquatic Therapy, [phone]- Electrical stimulation (unattended), 97035- Ultrasound, Patient/Family education, Balance training, DME instructions, and Moist heat  PLAN FOR NEXT SESSION:  Continue balance, strengthening, ankle flexibility exercises (consider adding to HEP).   *HOLD on Aquatics for now*.  Will need to give aquatic information (she will do this after several weeks in the clinic)  AQUATICS: Frequency: (after 3 weeks in clinic), pt to transition to 1x/wk aquatics and 1x/wk in clinic Duration: 3-5 weeks Special Instruction: flexibility, strength, balance work; pt is interested in aquatic exercise and may benefit from HEP for aquatic therapy and discussion  on where to do further aquatic exercise     8:29 AM, 09/14/23 M. Shary Decamp, PT, DPT Physical Therapist- [place] Office Number: [phone]

## 2023-09-18 ENCOUNTER — Inpatient Hospital Stay: Payer: Medicare Other

## 2023-09-18 ENCOUNTER — Telehealth: Payer: Self-pay | Admitting: Nurse Practitioner

## 2023-09-18 NOTE — Telephone Encounter (Signed)
 PT called stated that [employer] needs PA send so patient can get Hydrocodone prescription can be delivery. Please give patient a call. TY

## 2023-09-18 NOTE — Telephone Encounter (Signed)
 Attempted to start PA for Hydrocodone 5-325mg  with [employer]. However having trouble with completing PA due to error with name and birthday. Key code #[id number].   Will attempt later. Patient has been made aware.   FYI patient needs PA for [facility] as well. (Faxed in by [facility]).

## 2023-09-18 NOTE — Therapy (Signed)
 OUTPATIENT PHYSICAL THERAPY NEURO PROGRESS NOTE/RECERT   Patient Name: Brandy [name] MRN: [id number] DOB:01/20/1966, 58 y.o., female Today's Date: 09/19/2023   PCP: Caesar Bookman, NP  REFERRING PROVIDER: Caesar Bookman, NP   Progress Note Reporting Period 07/26/23 to 09/19/23  See note below for Objective Data and Assessment of Progress/Goals.    END OF SESSION:  PT End of Session - 09/19/23 0933     Visit Number 10    Number of Visits 18    Date for PT Re-Evaluation 10/17/23    Authorization Type [employer] Medicare    Authorization Time Period approved 17 PT visits from 07/26/2023-09/20/2023   new auth submitted   Authorization - Visit Number 10    Authorization - Number of Visits 17    Progress Note Due on Visit 10    PT Start Time 0849    PT Stop Time 0931    PT Time Calculation (min) 42 min    Equipment Utilized During Treatment Gait belt    Activity Tolerance Patient tolerated treatment well    Behavior During Therapy WFL for tasks assessed/performed                    Past Medical History:  Diagnosis Date   Acute pansinusitis 08/02/2017   Anxiety    Arthritis    ASD (atrial septal defect)    s/p closure with Amplatzer device 10/05/04 (Dr. Celso Amy, [facility]) 10/05/04   Cancer [facility])    Cataract    Chronic pain    CKD (chronic kidney disease)    Dyspnea    Fatty liver 09/04/2019   GERD (gastroesophageal reflux disease)    Heart murmur    no longer heard   History of hiatal hernia    Hyperlipidemia    Legally blind in right eye, as defined in [place]    Lumbar herniated disc    Migraines    On home oxygen therapy 06/15/2022   Pt was given O2 on D/C from [facility] 04/2022   OSA on CPAP 06/15/2022   PONV (postoperative nausea and vomiting)    Sciatica    Past Surgical History:  Procedure Laterality Date   ABLATION     BREAST LUMPECTOMY WITH RADIOACTIVE SEED AND SENTINEL LYMPH NODE BIOPSY Right 11/23/2020   Procedure: RIGHT BREAST  LUMPECTOMY WITH RADIOACTIVE SEED AND RIGHT AXILLARY SENTINEL LYMPH NODE BIOPSY;  Surgeon: Emelia Loron, MD;  Location: [facility] OR;  Service: General;  Laterality: Right;   BREAST SURGERY Bilateral 2011   Breast Reduction Surgery   BUNIONECTOMY     CARDIAC CATHETERIZATION     10/05/04 [facility]): LM < 25%, otherwise normal coronaries. No pulmonary HTN, Mildly enlarged RV. Secundum ASD s/p closure.   CARDIAC SURGERY     CATARACT EXTRACTION     CLEFT PALATE REPAIR     s/p cleft lip and palate repair   EYE SURGERY Right 2019   right eye removed   LUMBAR LAMINECTOMY/DECOMPRESSION MICRODISCECTOMY Left 05/23/2016   Procedure: LEFT L4-L5 LATERAL RECESS DECOMPRESSION WITH CENTRAL AND RIGHT DECOMPRESSION VIA LEFT SIDE;  Surgeon: Kerrin Champagne, MD;  Location: [facility] OR;  Service: Orthopedics;  Laterality: Left;   LUMBAR LAMINECTOMY/DECOMPRESSION MICRODISCECTOMY Left 05/23/2016   Procedure: LUMBAR LAMINECTOMY/DECOMPRESSION MICRODISCECTOMY Lumbar five - Sacral One 1 LEVEL;  Surgeon: Kerrin Champagne, MD;  Location: [facility] OR;  Service: Orthopedics;  Laterality: Left;   REDUCTION MAMMAPLASTY  2011   SHOULDER INJECTION Left 05/23/2016   Procedure: SHOULDER INJECTION;  Surgeon: Alphonso Jean, MD;  Location: [facility] OR;  Service: Orthopedics;  Laterality: Left;  band-aid per pa-c   TRANSTHORACIC ECHOCARDIOGRAM     12/15/05 [facility]): Mild LVH, EF > 55%, grade 1 diastolic dysfunction, Trivial MR/PR/TR.   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 (HCC) 09/13/2023   Chronic obstructive pulmonary disease, unspecified COPD type (HCC) 03/15/2023   Major depressive disorder, single episode, severe without psychotic features (HCC) 03/15/2023   Orthostatic hypotension 04/24/2022   Hyperbilirubinemia 04/24/2022   Snoring 07/14/2021   Fatigue 07/14/2021   Daytime  somnolence 07/14/2021   Status post device closure of ASD 06/03/2021   Secundum ASD 06/03/2021   Decreased diffusion capacity 05/14/2021   Dyspnea 03/04/2021   Pulmonary nodules 04/01/2020   Primary malignant neoplasm of breast with metastasis (HCC) 02/24/2020   Spondylosis without myelopathy or radiculopathy, lumbosacral region 12/31/2019   Chronic low back pain (Bilateral) w/o sciatica 12/31/2019   Chronic hip pain (3ry area of Pain) (Bilateral) (R>L) 11/27/2019   Chronic sacroiliac joint pain (Left) 11/27/2019   Chronic pain syndrome 11/11/2019   Encounter for chronic pain management 11/11/2019   Disorder of skeletal system 11/11/2019   Problems influencing health status 11/11/2019   Abnormal MRI, lumbar spine (08/22/2018) 11/11/2019   Chronic low back pain (1ry area of Pain) (Bilateral) w/ sciatica (Bilateral) 11/11/2019   DDD (degenerative disc disease), lumbosacral 11/11/2019   Grade 1 Anterolisthesis (L2-3, L3-4) 11/11/2019   Grade 1 Retrolisthesis (L4-5, L5-S1) 11/11/2019   Lumbar facet hypertrophy (Multilevel) (Bilateral) 11/11/2019   Lumbar facet syndrome (Multilevel) (Bilateral) (R>L) 11/11/2019   Chronic lower extremity pain (2ry area of Pain) (Bilateral) (R>L) 11/11/2019   Neurogenic pain 11/11/2019   Chronic musculoskeletal pain 11/11/2019   Infiltrating ductal carcinoma of breast (HCC) 10/08/2019   Fatty liver 09/04/2019   Obesity (BMI 30-39.9) 09/04/2019   GERD (gastroesophageal reflux disease)    Hyperlipidemia    CKD (chronic kidney disease), stage III (HCC)    Depression    Malignant hypertension    Ocular proptosis 02/14/2019   PVD (posterior vitreous detachment), left eye 02/14/2019   Chorioretinal scar of left eye 02/14/2019   Vitreous floaters of left eye 02/14/2019   Bilateral leg weakness 01/24/2019   Insomnia 08/02/2017   Low back pain radiating to both legs 08/02/2017   Migraine 08/02/2017   Neuropathy 08/02/2017   Spinal stenosis, lumbar region  with neurogenic claudication 05/23/2016    Class: Chronic   Left shoulder tendonitis 05/23/2016    Class: Acute   Post laminectomy syndrome 05/23/2016   Anxiety disorder 01/07/2015    ONSET DATE: 07/20/2023 (MD referral)  REFERRING DIAG:  M54.16 (ICD-10-CM) - Lumbar back pain with radiculopathy affecting left lower extremity  R26.81 (ICD-10-CM) - Unsteady gait  R29.6 (ICD-10-CM) - Frequent falls    THERAPY DIAG:  Unsteadiness on feet  Other abnormalities of gait and mobility  Muscle weakness (generalized)  Other low back pain  Rationale for Evaluation and Treatment: Rehabilitation  SUBJECTIVE:  SUBJECTIVE STATEMENT: Ankle swelled up and gave out on me yesterday when doing laundry. Reports that she was having trouble bearing weight after this occurred. Has an appointment with PCP tomorrow to address this.   Pt accompanied by: self  PERTINENT HISTORY: Per MD:  She has been experiencing worsening back pain following a fall last month during a snowstorm. She slipped on ice and fell, initially without pain, but began experiencing consistent pain about a week later. The pain is primarily located in the lower back, radiating more to the left side, with tenderness in the buttocks and back of the thigh. On February 9th, she experienced severe pain radiating from her left foot up her leg, causing her to fall back into bed. No current pain radiating down the leg, weakness, tingling or numbness. No swelling or bruising in the affected areas. She has a history of back pain and underwent a nerve ablation procedure at the end of January to manage her symptoms.  PMH:  breast cancer with metastasis;  Lumbar laminectomy/decompression microdiscectomy (Left, 05/23/2016); Lumbar laminectomy/decompression microdiscectomy  (Left, 05/23/2016); Shoulder injection (Left, 05/23/2016); Reduction mammaplasty (2011); Eye surgery (Right, 2019); Tubal ligation; and Breast lumpectomy with radioactive seed and sentinel lymph node biopsy (Right, 11/23/2020).   PAIN: L ankle 4/10 pain   PRECAUTIONS: Fall; decreased vision Per MD note:  She has issues with her eyes, specifically dry eyes, requiring the use of artificial tears every hour. She has a prosthetic eye (R) and uses lubricant and tapes her left eyelid shut at night to prevent dryness. Her vision can be distorted at times, affecting her daily activities.   RED FLAGS: None   WEIGHT BEARING RESTRICTIONS: No  FALLS: Has patient fallen in last 6 months? Yes. Number of falls at least 3 falls  LIVING ENVIRONMENT: Lives with: lives with their spouse Lives in: House/apartment Stairs:  6 steps, multiple steps -getting ready to move into apartment where there are no steps Has following equipment at home: Single point cane and Walker - 4 wheeled  PLOF: Independent with household mobility with device and Independent with community mobility with device  PATIENT GOALS: Pt's goals for therapy become painfree, more independent, exercise more  OBJECTIVE:     TODAY'S TREATMENT: 09/19/23 Activity Comments  Palpation  TTP inferior to L lateral malleolus with mild swelling. No warmth, discoloration   Assisted in donning L ankle lace up brace    Berg 52/56  Back pain index 10/50           PATIENT EDUCATION: Education details: edu on progress towards goals and remaining impairments; discussed pt's request to resume aquatic therapy, POC, discussed eventual transition to [facility] for fitness activities  Person educated: Patient Education method: Explanation, Demonstration, Tactile cues, and Verbal cues Education comprehension: verbalized understanding    HOME EXERCISE PROGRAM Access Code: [email or web address] URL: [email or web address] Date: 08/29/2023 Prepared by: [facility] -  Outpatient  Rehab - [facility]  Exercises - Supine Posterior Pelvic Tilt  - 1 x daily - 7 x weekly - 1-2 sets - 10 reps - Supine Hamstring Stretch with Strap  - 1 x daily - 5 x weekly - 2 sets - 30 sec hold - Supine Lower Trunk Rotation  - 1 x daily - 5 x weekly - 2 sets - 20 reps - Seated Anterior Pelvic Tilt  - 1 x daily - 7 x weekly - 1-2 sets - 10 reps - Wide Tandem Stance with Eyes Open  - 1 x daily - 5 x  weekly - 2 sets - 30 sec hold - Standing Toe Taps  - 1 x daily - 5 x weekly - 2 sets - 10 reps - Standing Shoulder Row with Anchored Resistance  - 1 x daily - 5 x weekly - 2 sets - 10 reps - Standing Anti-Rotation Press with Anchored Resistance  - 1 x daily - 7 x weekly - 3 sets - 10 reps - 2 sec hold - Hooklying Isometric Hip Flexion with Opposite Arm  - 1 x daily - 7 x weekly - 3 sets - 10 reps - 2 sec hold     ---------------------------------------------  Note: Objective measures were completed at Evaluation unless otherwise noted.  DIAGNOSTIC FINDINGS: x-rays to be completed 07/26/2023  COGNITION: Overall cognitive status: Within functional limits for tasks assessed   SENSATION: No reports of numbness   POSTURE: rounded shoulders, forward head, and posterior pelvic tilt  LOWER EXTREMITY ROM:     Active  Right Eval Left Eval  Hip flexion    Hip extension    Hip abduction    Hip adduction    Hip internal rotation    Hip external rotation    Knee flexion    Knee extension    Ankle dorsiflexion 5 5  Ankle plantarflexion    Ankle inversion    Ankle eversion     (Blank rows = not tested)  LOWER EXTREMITY MMT:    MMT Right Eval Left Eval  Hip flexion 4+ 4+  Hip extension    Hip abduction 4 4  Hip adduction 4 4  Hip internal rotation    Hip external rotation    Knee flexion 4 4  Knee extension 4+ 4+  Ankle dorsiflexion 4 4  Ankle plantarflexion    Ankle inversion    Ankle eversion    (Blank rows = not tested)  LUMBAR: Palpation:   Tender to palpation along paraspinals thoracic spine, SI joint area, bilateral buttocks/gluts/piriformis; pt tender to palpation along IT band R>L Baseline pain in standing:  0/10 Repeated standing flexion: 3/10 pain, into bilat buttocks Repeated standing extension:1-2/10 pain centralized at mid- low back Limited in lumbar extension flexibility  Hamstring flexibility from supine 90/90:  R  -15 degrees; L -20 degrees SLR test from supine:  R 85 degrees, L 80 degrees  TRANSFERS: Assistive device utilized: None BUE support Sit to stand: Modified independence Stand to sit: Modified independence   GAIT: Gait pattern: step through pattern, antalgic, lateral lean- Right, and lateral lean- Left Distance walked: 40-50 ft Assistive device utilized: Single point cane Level of assistance: Modified independence Comments: Has rollator at home, reports less pain with gait when using rollator   PATIENT SURVEYS:  Modified Oswestry (Back Index):  48                                                                                                                               TREATMENT DATE: 07/26/2023  PATIENT EDUCATION: Education details: Eval results, POC; Educated pt on posture/positioning with use of lumbar roll with sititng; discussed proper positioning/height of rollator; discussed pain management with prioritizing movements that centralize symptoms and lessen movements that cause symptom radiation.  Initiated HEP-see below Person educated: Patient Education method: Explanation, Demonstration, and Handouts Education comprehension: verbalized understanding, returned demonstration, and needs further education  HOME EXERCISE PROGRAM: Access Code: [email or web address] URL: [email or web address] Date: 07/26/2023 Prepared by: [facility] - Outpatient  Rehab - [facility]  Exercises - Supine Posterior Pelvic Tilt  - 1 x daily - 7 x weekly - 1-2 sets - 10 reps - Seated Anterior Pelvic  Tilt  - 1 x daily - 7 x weekly - 1-2 sets - 10 reps  GOALS: Goals reviewed with patient? Yes  SHORT TERM GOALS: Target date: 08/25/2023  Pt will be independent with HEP for improved strength, flexibility, balance, gait. Baseline: Pt denies questions on HEP 08/23/23 Goal status: MET  08/23/23  2.  Pt will report low back pain reduced by 50% during functional daily activities. Baseline:  Reports LBP is "touch and go." Reports that is it a "teeny weeny bit better" 08/24/23  Goal status: IN PROGRESS 08/24/23   3. Patient to score at least 46/56 on Berg in order to decrease risk of falls.  Baseline: 42 08/24/23; 52/56 09/19/23 Goal status:MET 09/19/23  4.  Pt will verbalize understanding of fall prevention and posture/body mechanics for decreased pain/decreased fall risk.  Baseline: provided today 08/23/23  Goal status: MET 08/23/23   LONG TERM GOALS: Target date: 10/17/2023  Pt will be independent with HEP for improved strength, flexibility, pain, balance, gait. Baseline: pt reports "I think I'm doing better" 09/19/23  Goal status: IN PROGRESS 09/19/23  2.  Back index score to decrease to </= 40 for improved back pain and decreased disability. Baseline: 10/50 (20%) 09/19/23 Goal status: MET 09/19/23  3.  Berg Score to improve by 10 points from baseline to demo decreased fall risk. Baseline: 52/56 09/19/23 Goal status: MET 09/19/23  4.  Pt will verbalize plans for continued community fitness upon d/c from PT to maximize gains in PT. Baseline:  educated on this today, pt reports that she has a [employer] 09/19/23 Goal status: IN PROGRESS 09/19/23  ASSESSMENT:  CLINICAL IMPRESSION: Patient arrived to session with report of L ankle swelling and buckling yesterday, causing difficulty bearing weight on it. Patient reports compliance with HEP and denies concerns. Patient has met back disability goal today. Randye Buttner has improved to 51/56, indicating a decreased risk of falls and improved from initial  assessment. Patient requests resumption of aquatic therapy, which would likely be beneficial d/t ongoing L ankle pain. Patient is progressing well towards goals. Would benefit from additional skilled PT services 2x/week for 4 weeks to address remaining goals.   OBJECTIVE IMPAIRMENTS: Abnormal gait, decreased balance, decreased mobility, difficulty walking, decreased ROM, decreased strength, impaired flexibility, postural dysfunction, and pain.   ACTIVITY LIMITATIONS: lifting, sitting, standing, stairs, transfers, and locomotion level  PARTICIPATION LIMITATIONS: community activity and exercise  PERSONAL FACTORS: 3+ comorbidities: see above  are also affecting patient's functional outcome.   REHAB POTENTIAL: Good  CLINICAL DECISION MAKING: Evolving/moderate complexity  EVALUATION COMPLEXITY: Moderate  PLAN:  PT FREQUENCY: 2x/week  PT DURATION: 4 weeks   PLANNED INTERVENTIONS: 97110-Therapeutic exercises, 97530- Therapeutic activity, [id number]- Neuromuscular re-education, 97535- Self Care, [address]- Manual therapy, [phone]- Gait training, [phone]- Aquatic Therapy, [phone]- Electrical stimulation (unattended), [phone]- Ultrasound, Patient/Family education, Balance training, DME instructions, and Moist  heat  PLAN FOR NEXT SESSION:  Continue balance, strengthening, ankle flexibility exercises (consider adding to HEP).  AQUATICS: Frequency: 1x/wk aquatics and 1x/wk in clinic Duration: 4 weeks Special Instruction: flexibility, strength, balance work; pt is interested in aquatic exercise and may benefit from HEP for aquatic therapy and discussion on where to do further aquatic exercise     Thaddeus Filippo, PT, DPT 09/19/23 9:44 AM  [facility]patient Rehab at [facility] [address], [address] [place], [place] [address] Phone # [phone] Fax # [phone]

## 2023-09-19 ENCOUNTER — Encounter: Payer: Self-pay | Admitting: Physical Therapy

## 2023-09-19 ENCOUNTER — Ambulatory Visit: Admitting: Physical Therapy

## 2023-09-19 DIAGNOSIS — R2689 Other abnormalities of gait and mobility: Secondary | ICD-10-CM

## 2023-09-19 DIAGNOSIS — M6281 Muscle weakness (generalized): Secondary | ICD-10-CM

## 2023-09-19 DIAGNOSIS — M5459 Other low back pain: Secondary | ICD-10-CM | POA: Diagnosis not present

## 2023-09-19 DIAGNOSIS — R2681 Unsteadiness on feet: Secondary | ICD-10-CM

## 2023-09-19 DIAGNOSIS — R293 Abnormal posture: Secondary | ICD-10-CM | POA: Diagnosis not present

## 2023-09-19 DIAGNOSIS — R262 Difficulty in walking, not elsewhere classified: Secondary | ICD-10-CM | POA: Diagnosis not present

## 2023-09-20 ENCOUNTER — Encounter: Payer: Self-pay | Admitting: Family

## 2023-09-20 ENCOUNTER — Ambulatory Visit (INDEPENDENT_AMBULATORY_CARE_PROVIDER_SITE_OTHER): Admitting: Family

## 2023-09-20 VITALS — BP 126/72 | HR 98 | Temp 97.9°F | Resp 19 | Ht 67.0 in | Wt 206.4 lb

## 2023-09-20 DIAGNOSIS — M25472 Effusion, left ankle: Secondary | ICD-10-CM

## 2023-09-20 DIAGNOSIS — Z124 Encounter for screening for malignant neoplasm of cervix: Secondary | ICD-10-CM | POA: Diagnosis not present

## 2023-09-20 DIAGNOSIS — R748 Abnormal levels of other serum enzymes: Secondary | ICD-10-CM | POA: Diagnosis not present

## 2023-09-20 DIAGNOSIS — M25572 Pain in left ankle and joints of left foot: Secondary | ICD-10-CM

## 2023-09-20 NOTE — Progress Notes (Signed)
 Provider: Richarda Blade FNP-C  Glady Ouderkirk, Donalee Citrin, NP  Patient Care Team: Amaad Byers, Donalee Citrin, NP as PCP - General (Family Medicine) Thomasene Ripple, DO as PCP - Cardiology (Cardiology) Elie Confer, NP as Nurse Practitioner  Extended Emergency Contact Information Primary Emergency Contact: [facility] Address: [address]          [place], [place] [address] Darden Amber of [place] Home Phone: [phone] Relation: Daughter Secondary Emergency Contact: Tucker,[name] Mobile Phone: [phone] Relation: Other Preferred language: English  Code Status:  Full Code  Goals of care: Advanced Directive information    09/20/2023    8:49 AM  Advanced Directives  Does Patient Have a Medical Advance Directive? No  Would patient like information on creating a medical advance directive? No - Patient declined     Chief Complaint  Patient presents with   Gynecologic Exam    Pap smear.    Discussed the use of AI scribe software for clinical note transcription with the patient, who gave verbal consent to proceed.  History of Present Illness   Katelen Luepke is a 58 year old female who presents with persistent ankle swelling and pain.  She has been experiencing persistent swelling in her ankle, which has worsened to the point where she cannot put any pressure on it since Monday. The swelling remains present, and she describes it as 'slipped there again.'  She has been attending physical therapy, where the therapist noted the issue felt like 'a string' and massaged the muscle, which was tender and painful. She has tried using Voltaren Gel but it only provided temporary relief. Previously, she received a cortisone shot, which only lasted for a day.  She lives alone in an apartment and tries to sit down when the pain starts. She has also attempted using ice, but it has not been effective.  She has a history of visiting a foot and ankle specialist named Genelle Bal at a  clinic on [address], where she received a Kloss cast, which she feels worsened her condition. She is here for pap smear. No abdominal pain, discharge, urinary burning or itching, and reports regular bowel movements. Has upcoming appointment for mammogram. Her recent lab work reviewed. Mg level 1.1 will restart her OTC magnesium. Total chol 304  improved from 329; TRG 290 previous 243 and LDL down from 186 to 169.she was started on Rapatha and follows up with pharmacist every 2 weeks.Has started exercise and cutting down on sandwiches.  Liver enzymes still elevated but stable compared to previous level.Alk phos 282,AST 59 and ALT 51 Has a history of fatty liver.    She denies any abdominal pain,nausea or vomiting.   Past Medical History:  Diagnosis Date   Acute pansinusitis 08/02/2017   Anxiety    Arthritis    ASD (atrial septal defect)    s/p closure with Amplatzer device 10/05/04 (Dr. Celso Amy, [facility]) 10/05/04   [facility])    Cataract    Chronic pain    CKD (chronic kidney disease)    Dyspnea    Fatty liver 09/04/2019   GERD (gastroesophageal reflux disease)    Heart murmur    no longer heard   History of hiatal hernia    Hyperlipidemia    Legally blind in right eye, as defined in [place]    Lumbar herniated disc    Migraines    On home oxygen therapy 06/15/2022   Pt was given O2 on D/C from [facility] 04/2022   OSA  on CPAP 06/15/2022   PONV (postoperative nausea and vomiting)    Sciatica    Past Surgical History:  Procedure Laterality Date   ABLATION     BREAST LUMPECTOMY WITH RADIOACTIVE SEED AND SENTINEL LYMPH NODE BIOPSY Right 11/23/2020   Procedure: RIGHT BREAST LUMPECTOMY WITH RADIOACTIVE SEED AND RIGHT AXILLARY SENTINEL LYMPH NODE BIOPSY;  Surgeon: Enid Harry, MD;  Location: [facility] OR;  Service: General;  Laterality: Right;   BREAST SURGERY Bilateral 2011   Breast Reduction Surgery   BUNIONECTOMY     CARDIAC CATHETERIZATION     10/05/04 [facility]): LM < 25%,  otherwise normal coronaries. No pulmonary HTN, Mildly enlarged RV. Secundum ASD s/p closure.   CARDIAC SURGERY     CATARACT EXTRACTION     CLEFT PALATE REPAIR     s/p cleft lip and palate repair   EYE SURGERY Right 2019   right eye removed   LUMBAR LAMINECTOMY/DECOMPRESSION MICRODISCECTOMY Left 05/23/2016   Procedure: LEFT L4-L5 LATERAL RECESS DECOMPRESSION WITH CENTRAL AND RIGHT DECOMPRESSION VIA LEFT SIDE;  Surgeon: Alphonso Jean, MD;  Location: [facility] OR;  Service: Orthopedics;  Laterality: Left;   LUMBAR LAMINECTOMY/DECOMPRESSION MICRODISCECTOMY Left 05/23/2016   Procedure: LUMBAR LAMINECTOMY/DECOMPRESSION MICRODISCECTOMY Lumbar five - Sacral One 1 LEVEL;  Surgeon: Alphonso Jean, MD;  Location: [facility] OR;  Service: Orthopedics;  Laterality: Left;   REDUCTION MAMMAPLASTY  2011   SHOULDER INJECTION Left 05/23/2016   Procedure: SHOULDER INJECTION;  Surgeon: Alphonso Jean, MD;  Location: [facility] OR;  Service: Orthopedics;  Laterality: Left;  band-aid per pa-c   TRANSTHORACIC ECHOCARDIOGRAM     12/15/05 [facility]): Mild LVH, EF > 55%, grade 1 diastolic dysfunction, Trivial MR/PR/TR.   TUBAL LIGATION      Allergies  Allergen Reactions   Zithromax [Azithromycin] Shortness Of Breath, Itching and Other (See Comments)    TOTAL BODY ITCHING [EVEN SOLES OF FEET] WHEEZING    Tramadol Itching and Other (See Comments)    Has taken recently without any side effects.   Psyllium Nausea And Vomiting and Other (See Comments)    Metamucil. Sneezing      Outpatient Encounter Medications as of 09/20/2023  Medication Sig   acetaminophen (TYLENOL) 500 MG tablet Take 500 mg by mouth daily as needed for moderate pain.   albuterol (VENTOLIN HFA) 108 (90 Base) MCG/ACT inhaler Inhale 2 puffs into the lungs every 6 (six) hours as needed for wheezing or shortness of breath.   ALPRAZolam (XANAX) 1 MG tablet TAKE 1 TABLET BY MOUTH AT  BEDTIME AS NEEDED FOR ANXIETY   amitriptyline (ELAVIL) 75 MG tablet Take 1 tablet (75 mg  total) by mouth at bedtime.   [START ON 09/22/2023] Buprenorphine HCl (BELBUCA) 300 MCG FILM Place 300 mcg inside cheek 2 (two) times daily.   Carboxymethylcellul-Glycerin (LUBRICATING EYE DROPS OP) Place 1 drop into the right eye in the morning, at noon, and at bedtime.   cyanocobalamin (VITAMIN B12) 500 MCG tablet Take 1 tablet (500 mcg total) by mouth daily.   Evolocumab (REPATHA SURECLICK) 140 MG/ML SOAJ Inject 140 mg into the skin every 14 (fourteen) days.   fluticasone-salmeterol (ADVAIR) 250-50 MCG/ACT AEPB Inhale 1 puff into the lungs in the morning and at bedtime.   HYDROcodone-acetaminophen (NORCO/VICODIN) 5-325 MG tablet Take 1 tablet by mouth 3 (three) times daily as needed for severe pain (pain score 7-10). Must last 30 days   ketoconazole (NIZORAL) 2 % cream Apply 1 Application topically 2 (two) times daily.   loperamide (IMODIUM) 2 MG  capsule Take 1-2 capsules (2-4 mg total) by mouth 4 (four) times daily as needed for diarrhea or loose stools.   methocarbamol (ROBAXIN) 500 MG tablet Take 1 tablet (500 mg total) by mouth every 6 (six) hours as needed for muscle spasms.   midodrine (PROAMATINE) 10 MG tablet TAKE 1 TABLET BY MOUTH IN THE  MORNING AND AT BEDTIME   Multiple Vitamin (MULTIVITAMIN WITH MINERALS) TABS tablet Take 1 tablet by mouth daily.   omeprazole (PRILOSEC OTC) 20 MG tablet Take 20 mg by mouth daily.   PRESCRIPTION MEDICATION CPAP- At bedtime   triamcinolone cream (KENALOG) 0.1 % Apply 1 Application topically 2 (two) times daily.   VERZENIO 100 MG tablet TAKE 1 TABLET BY MOUTH TWICE DAILY   Vitamin D, Ergocalciferol, (DRISDOL) 1.25 MG (50000 UNIT) CAPS capsule TAKE 1 CAPSULE BY MOUTH EVERY 7  DAYS ( MONDAY )   No facility-administered encounter medications on file as of 09/20/2023.    Review of Systems  Constitutional:  Negative for appetite change, chills, fatigue, fever and unexpected weight change.  Eyes:  Negative for pain, discharge, redness, itching and visual  disturbance.  Respiratory:  Negative for cough, chest tightness, shortness of breath and wheezing.   Cardiovascular:  Negative for chest pain, palpitations and leg swelling.  Gastrointestinal:  Negative for abdominal distention, abdominal pain, constipation, diarrhea, nausea and vomiting.  Genitourinary:  Negative for difficulty urinating, dysuria, flank pain, frequency and urgency.  Musculoskeletal:  Negative for arthralgias, back pain, gait problem, joint swelling, myalgias, neck pain and neck stiffness.  Skin:  Negative for color change, pallor, rash and wound.  Neurological:  Negative for dizziness, syncope, speech difficulty, weakness, light-headedness, numbness and headaches.  Hematological:  Does not bruise/bleed easily.  Psychiatric/Behavioral:  Negative for agitation, behavioral problems, confusion, hallucinations and sleep disturbance. The patient is not nervous/anxious.     Immunization History  Administered Date(s) Administered   Hepatitis A 03/02/2023   Hepatitis B 03/02/2023   Influenza Inj Mdck Quad Pf 04/25/2019   Influenza,inj,Quad PF,6+ Mos 02/24/2020, 04/15/2022, 02/21/2023   Influenza,inj,quad, With Preservative 04/25/2019   Influenza-Unspecified 05/26/2021   Moderna Sars-Covid-2 Vaccination 06/29/2019, 08/03/2019, 06/24/2020   PFIZER(Purple Top)SARS-COV-2 Vaccination 05/26/2021   Pneumococcal Conjugate-13 05/29/2019   Pneumococcal Polysaccharide-23 07/29/2019   Tdap 02/21/2023   Unspecified SARS-COV-2 Vaccination 02/21/2023   Zoster Recombinant(Shingrix) 02/17/2020, 04/20/2020   Pertinent  Health Maintenance Due  Topic Date Due   FOOT EXAM  Never done   OPHTHALMOLOGY EXAM  Never done   HEMOGLOBIN A1C  01/24/2023   INFLUENZA VACCINE  01/05/2024   MAMMOGRAM  09/13/2024   Colonoscopy  08/16/2027      07/05/2023    8:32 AM 07/20/2023   10:38 AM 08/17/2023   10:42 AM 09/12/2023    8:20 AM 09/13/2023    9:25 AM  Fall Risk  Falls in the past year? 1 1 0 1 1  Was  there an injury with Fall? 0 0 0 0 0  Was there an injury with Fall? - Comments    other than bumps and bruises and feeling sore.  slipped on the ice in January   Fall Risk Category Calculator 2 2 0 2 1  Patient at Risk for Falls Due to   No Fall Risks History of fall(s) History of fall(s)  Fall risk Follow up   Falls evaluation completed Falls evaluation completed;Education provided Falls evaluation completed   Functional Status Survey:    Vitals:   09/20/23 0851  BP: 126/72  Pulse:  98  Resp: 19  Temp: 97.9 F (36.6 C)  SpO2: 92%  Weight: 206 lb 6.4 oz (93.6 kg)  Height: 5\' 7"  (1.702 m)   Body mass index is 32.33 kg/m. Physical Exam  GENERAL: Alert, cooperative, well developed, no acute distress HEENT: Normocephalic, normal oropharynx, moist mucous membranes CHEST: Clear to auscultation bilaterally, No wheezes, rhonchi, or crackles CARDIOVASCULAR: Normal heart rate and rhythm, S1 and S2 normal without murmurs ABDOMEN: Soft, non-tender, non-distended, without organomegaly, Normal bowel sounds GENITOURINARY: Normal examination of groin, ovaries, and cervix No bleeding or spotting.Ronald Pippins ,CMA chaperone present through out the exam. Tolerated procedure well. EXTREMITIES: No cyanosis or edema NEUROLOGICAL: Cranial nerves grossly intact, Moves all extremities without gross motor or sensory deficit   Labs reviewed: Recent Labs    03/15/23 0927 04/03/23 1311 07/21/23 0804 08/28/23 1352 09/13/23 1012  NA 141   < > 140 143 143  K 4.2   < > 4.0 3.8 3.9  CL 102   < > 104 105 102  CO2 30   < > 29 31 29   GLUCOSE 84   < > 97 104* 92  BUN 18   < > 17 11 11   CREATININE 1.24*   < > 1.43* 1.19* 1.05*  CALCIUM 9.4   < > 9.3 9.4 9.0  MG 1.6  --   --   --  1.1*   < > = values in this interval not displayed.   Recent Labs    06/23/23 0811 07/21/23 0804 08/28/23 1352 09/13/23 1012  AST 73* 67* 90* 59*  59*  ALT 72* 57* 88* 51*  51*  ALKPHOS 236* 236* 272*  --    BILITOT 1.0 0.8 0.5 0.7  0.7  PROT 7.4 6.8 7.0 6.7  6.7  ALBUMIN 3.8 3.5 3.6  --    Recent Labs    06/23/23 0811 07/21/23 0804 08/28/23 1352  WBC 4.6 4.2 3.1*  NEUTROABS 2.0 1.3* 1.1*  HGB 11.8* 11.4* 12.3  HCT 34.9* 34.0* 37.2  MCV 105.8* 107.6* 107.5*  PLT 215 191 198   Lab Results  Component Value Date   TSH 1.26 09/13/2023   Lab Results  Component Value Date   HGBA1C 5.3 07/26/2022   Lab Results  Component Value Date   CHOL 304 (H) 09/13/2023   HDL 83 09/13/2023   LDLCALC 169 (H) 09/13/2023   TRIG 290 (H) 09/13/2023   CHOLHDL 3.7 09/13/2023    Significant Diagnostic Results in last 30 days:  DG Ankle Complete Left Result Date: 08/30/2023 CLINICAL DATA:  Ankle pain and swelling EXAM: LEFT ANKLE COMPLETE - 3+ VIEW COMPARISON:  None Available. FINDINGS: There is no evidence of fracture, dislocation, or joint effusion. There is no evidence of arthropathy or other focal bone abnormality. Soft tissues are unremarkable. IMPRESSION: Negative. Electronically Signed   By: Shaaron Adler M.D.   On: 08/30/2023 16:09    Assessment/Plan  Ankle Pain Persistent swelling and tenderness in the ankle, exacerbated by pressure. Previous cortisone injection provided only temporary relief. Physical therapy noted muscle tenderness and applied topical treatment with limited effect. Use of a Kloss cast worsened the condition. Current management includes referral to an ankle specialist for further evaluation and management. - Refer to an ankle specialist for further evaluation and management.  Cervical cancer screening  Routine health maintenance visit including a pelvic exam and Pap smear. Asymptomatic for abdominal pain, discharge, or urinary symptoms. Regular bowel movements. No rash, tenderness or swelling in the groin area.  Asymptomatic hidradenitis suppurativa post-cyst removal. - Pap smear completed. Tolerated procedure well.Presley Broker ,CMA chaperone present through out the  exam.   Family/ staff Communication: Reviewed plan of care with patient verbalized understanding   Labs/tests ordered: Pap smear   Next Appointment: Return if symptoms worsen or fail to improve.   Total time: minutes. Greater than 50% of total time spent doing patient education regarding Pap smear,chronic Ankle pain,health maintenance including symptom/medication management.   Estil Heman, NP

## 2023-09-20 NOTE — Patient Instructions (Signed)
 Notify provider for any signs of bleeding or infection

## 2023-09-21 ENCOUNTER — Ambulatory Visit: Admitting: Physical Therapy

## 2023-09-21 LAB — TOXASSURE SELECT 13 (MW), URINE

## 2023-09-22 ENCOUNTER — Other Ambulatory Visit: Payer: Self-pay

## 2023-09-22 DIAGNOSIS — C50919 Malignant neoplasm of unspecified site of unspecified female breast: Secondary | ICD-10-CM

## 2023-09-25 ENCOUNTER — Inpatient Hospital Stay

## 2023-09-25 ENCOUNTER — Inpatient Hospital Stay: Attending: Hematology

## 2023-09-25 ENCOUNTER — Ambulatory Visit
Admission: RE | Admit: 2023-09-25 | Discharge: 2023-09-25 | Disposition: A | Discharge status: Home / Self Care | Source: Ambulatory Visit | Attending: Family | Admitting: Family

## 2023-09-25 DIAGNOSIS — Z5111 Encounter for antineoplastic chemotherapy: Secondary | ICD-10-CM | POA: Diagnosis not present

## 2023-09-25 DIAGNOSIS — C50919 Malignant neoplasm of unspecified site of unspecified female breast: Secondary | ICD-10-CM

## 2023-09-25 DIAGNOSIS — E876 Hypokalemia: Secondary | ICD-10-CM | POA: Insufficient documentation

## 2023-09-25 DIAGNOSIS — Z87891 Personal history of nicotine dependence: Secondary | ICD-10-CM | POA: Diagnosis not present

## 2023-09-25 DIAGNOSIS — Z79899 Other long term (current) drug therapy: Secondary | ICD-10-CM | POA: Insufficient documentation

## 2023-09-25 DIAGNOSIS — Z170 Estrogen receptor positive status [ER+]: Secondary | ICD-10-CM | POA: Diagnosis not present

## 2023-09-25 DIAGNOSIS — Z79818 Long term (current) use of other agents affecting estrogen receptors and estrogen levels: Secondary | ICD-10-CM | POA: Insufficient documentation

## 2023-09-25 DIAGNOSIS — C7800 Secondary malignant neoplasm of unspecified lung: Secondary | ICD-10-CM | POA: Diagnosis not present

## 2023-09-25 DIAGNOSIS — R748 Abnormal levels of other serum enzymes: Secondary | ICD-10-CM

## 2023-09-25 DIAGNOSIS — R945 Abnormal results of liver function studies: Secondary | ICD-10-CM | POA: Diagnosis not present

## 2023-09-25 DIAGNOSIS — C50511 Malignant neoplasm of lower-outer quadrant of right female breast: Secondary | ICD-10-CM | POA: Diagnosis not present

## 2023-09-25 LAB — CBC WITH DIFFERENTIAL (CANCER CENTER ONLY)
Abs Immature Granulocytes: 0.02 10*3/uL (ref 0.00–0.07)
Basophils Absolute: 0 10*3/uL (ref 0.0–0.1)
Basophils Relative: 1 %
Eosinophils Absolute: 0.1 10*3/uL (ref 0.0–0.5)
Eosinophils Relative: 2 %
HCT: 37 % (ref 36.0–46.0)
Hemoglobin: 12.4 g/dL (ref 12.0–15.0)
Immature Granulocytes: 1 %
Lymphocytes Relative: 49 %
Lymphs Abs: 2.2 10*3/uL (ref 0.7–4.0)
MCH: 34.5 pg — ABNORMAL HIGH (ref 26.0–34.0)
MCHC: 33.5 g/dL (ref 30.0–36.0)
MCV: 103.1 fL — ABNORMAL HIGH (ref 80.0–100.0)
Monocytes Absolute: 0.3 10*3/uL (ref 0.1–1.0)
Monocytes Relative: 8 %
Neutro Abs: 1.7 10*3/uL (ref 1.7–7.7)
Neutrophils Relative %: 39 %
Platelet Count: 266 10*3/uL (ref 150–400)
RBC: 3.59 MIL/uL — ABNORMAL LOW (ref 3.87–5.11)
RDW: 13.5 % (ref 11.5–15.5)
WBC Count: 4.4 10*3/uL (ref 4.0–10.5)
nRBC: 0 % (ref 0.0–0.2)

## 2023-09-25 LAB — CMP (CANCER CENTER ONLY)
ALT: 44 U/L (ref 0–44)
AST: 54 U/L — ABNORMAL HIGH (ref 15–41)
Albumin: 3.9 g/dL (ref 3.5–5.0)
Alkaline Phosphatase: 241 U/L — ABNORMAL HIGH (ref 38–126)
Anion gap: 6 (ref 5–15)
BUN: 18 mg/dL (ref 6–20)
CO2: 31 mmol/L (ref 22–32)
Calcium: 9.8 mg/dL (ref 8.9–10.3)
Chloride: 103 mmol/L (ref 98–111)
Creatinine: 1.24 mg/dL — ABNORMAL HIGH (ref 0.44–1.00)
GFR, Estimated: 50 mL/min — ABNORMAL LOW (ref >60)
Glucose, Bld: 78 mg/dL (ref 70–99)
Potassium: 4.4 mmol/L (ref 3.5–5.1)
Sodium: 140 mmol/L (ref 135–145)
Total Bilirubin: 0.8 mg/dL (ref 0.0–1.2)
Total Protein: 7.4 g/dL (ref 6.5–8.1)

## 2023-09-25 MED ORDER — DIPHENHYDRAMINE HCL 25 MG PO CAPS: 25.0000 mg | ORAL_CAPSULE | Freq: Once | ORAL | Status: DC

## 2023-09-25 MED ORDER — FULVESTRANT 250 MG/5ML IM SOSY
500.0000 mg | PREFILLED_SYRINGE | Freq: Once | INTRAMUSCULAR | Status: AC
Start: 2023-09-25 — End: 2023-09-25
  Administered 2023-09-25: 500 mg via INTRAMUSCULAR
  Filled 2023-09-25: qty 10

## 2023-09-25 MED ORDER — IBUPROFEN 200 MG PO TABS: 400.0000 mg | ORAL_TABLET | Freq: Once | ORAL | Status: DC

## 2023-09-25 MED ORDER — FAMOTIDINE 20 MG PO TABS: 20.0000 mg | ORAL_TABLET | Freq: Once | ORAL | Status: DC

## 2023-09-26 ENCOUNTER — Ambulatory Visit: Admitting: Rehabilitation

## 2023-09-26 ENCOUNTER — Encounter: Payer: Self-pay | Admitting: Rehabilitation

## 2023-09-26 DIAGNOSIS — R262 Difficulty in walking, not elsewhere classified: Secondary | ICD-10-CM | POA: Diagnosis not present

## 2023-09-26 DIAGNOSIS — M5459 Other low back pain: Secondary | ICD-10-CM

## 2023-09-26 DIAGNOSIS — R293 Abnormal posture: Secondary | ICD-10-CM | POA: Diagnosis not present

## 2023-09-26 DIAGNOSIS — R2681 Unsteadiness on feet: Secondary | ICD-10-CM

## 2023-09-26 DIAGNOSIS — M6281 Muscle weakness (generalized): Secondary | ICD-10-CM | POA: Diagnosis not present

## 2023-09-26 DIAGNOSIS — R2689 Other abnormalities of gait and mobility: Secondary | ICD-10-CM

## 2023-09-26 LAB — PAP IG W/ RFLX HPV ASCU

## 2023-09-26 NOTE — Therapy (Signed)
 OUTPATIENT PHYSICAL THERAPY NEURO PROGRESS NOTE/RECERT   Patient Name: Brandy [name]  MRN: [id number] DOB:September 18, 1965, 58 y.o., female Today's Date: 09/26/2023   PCP: Estil Heman, NP  REFERRING PROVIDER: Estil Heman, NP   Progress Note Reporting Period 07/26/23 to 09/19/23  See note below for Objective Data and Assessment of Progress/Goals.    END OF SESSION:  PT End of Session - 09/26/23 0809     Visit Number 11    Number of Visits 18    Date for PT Re-Evaluation 10/17/23    Authorization Type [employer] Medicare    Authorization Time Period approved 4 visits from 4/15-5/13/25   new auth submitted   Authorization - Visit Number 1    Authorization - Number of Visits 4    Progress Note Due on Visit 10    PT Start Time 0930    PT Stop Time 1015    PT Time Calculation (min) 45 min    Equipment Utilized During Treatment Other (comment)   floatation devices as needed for safety   Activity Tolerance Patient tolerated treatment well    Behavior During Therapy [facility] for tasks assessed/performed                    Past Medical History:  Diagnosis Date   Acute pansinusitis 08/02/2017   Anxiety    Arthritis    ASD (atrial septal defect)    s/p closure with Amplatzer device 10/05/04 (Dr. Kyla Phlegm, [facility]) 10/05/04   Cancer [facility])    Cataract    Chronic pain    CKD (chronic kidney disease)    Dyspnea    Fatty liver 09/04/2019   GERD (gastroesophageal reflux disease)    Heart murmur    no longer heard   History of hiatal hernia    Hyperlipidemia    Legally blind in right eye, as defined in [place]     Lumbar herniated disc    Migraines    On home oxygen  therapy 06/15/2022   Pt was given O2 on D/C from [facility] 04/2022   OSA on CPAP 06/15/2022   PONV (postoperative nausea and vomiting)    Sciatica    Past Surgical History:  Procedure Laterality Date   ABLATION     BREAST LUMPECTOMY WITH RADIOACTIVE SEED AND SENTINEL LYMPH NODE BIOPSY Right  11/23/2020   Procedure: RIGHT BREAST LUMPECTOMY WITH RADIOACTIVE SEED AND RIGHT AXILLARY SENTINEL LYMPH NODE BIOPSY;  Surgeon: Enid Harry, MD;  Location: [facility] OR;  Service: General;  Laterality: Right;   BREAST SURGERY Bilateral 2011   Breast Reduction Surgery   BUNIONECTOMY     CARDIAC CATHETERIZATION     10/05/04 [facility]): LM < 25%, otherwise normal coronaries. No pulmonary HTN, Mildly enlarged RV. Secundum ASD s/p closure.   CARDIAC SURGERY     CATARACT EXTRACTION     CLEFT PALATE REPAIR     s/p cleft lip and palate repair   EYE SURGERY Right 2019   right eye removed   LUMBAR LAMINECTOMY/DECOMPRESSION MICRODISCECTOMY Left 05/23/2016   Procedure: LEFT L4-L5 LATERAL RECESS DECOMPRESSION WITH CENTRAL AND RIGHT DECOMPRESSION VIA LEFT SIDE;  Surgeon: Alphonso Jean, MD;  Location: [facility] OR;  Service: Orthopedics;  Laterality: Left;   LUMBAR LAMINECTOMY/DECOMPRESSION MICRODISCECTOMY Left 05/23/2016   Procedure: LUMBAR LAMINECTOMY/DECOMPRESSION MICRODISCECTOMY Lumbar five - Sacral One 1 LEVEL;  Surgeon: Alphonso Jean, MD;  Location: [facility] OR;  Service: Orthopedics;  Laterality: Left;   REDUCTION MAMMAPLASTY  2011   SHOULDER INJECTION Left  05/23/2016   Procedure: SHOULDER INJECTION;  Surgeon: Alphonso Jean, MD;  Location: [facility] OR;  Service: Orthopedics;  Laterality: Left;  band-aid per pa-c   TRANSTHORACIC ECHOCARDIOGRAM     12/15/05 [facility]): Mild LVH, EF > 55%, grade 1 diastolic dysfunction, Trivial MR/PR/TR.   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 (HCC) 09/13/2023   Chronic obstructive pulmonary disease, unspecified COPD type (HCC) 03/15/2023   Major depressive disorder, single episode, severe without psychotic features (HCC) 03/15/2023   Orthostatic hypotension 04/24/2022   Hyperbilirubinemia 04/24/2022   Snoring 07/14/2021    Fatigue 07/14/2021   Daytime somnolence 07/14/2021   Status post device closure of ASD 06/03/2021   Secundum ASD 06/03/2021   Decreased diffusion capacity 05/14/2021   Dyspnea 03/04/2021   Pulmonary nodules 04/01/2020   Primary malignant neoplasm of breast with metastasis (HCC) 02/24/2020   Spondylosis without myelopathy or radiculopathy, lumbosacral region 12/31/2019   Chronic low back pain (Bilateral) w/o sciatica 12/31/2019   Chronic hip pain (3ry area of Pain) (Bilateral) (R>L) 11/27/2019   Chronic sacroiliac joint pain (Left) 11/27/2019   Chronic pain syndrome 11/11/2019   Encounter for chronic pain management 11/11/2019   Disorder of skeletal system 11/11/2019   Problems influencing health status 11/11/2019   Abnormal MRI, lumbar spine (08/22/2018) 11/11/2019   Chronic low back pain (1ry area of Pain) (Bilateral) w/ sciatica (Bilateral) 11/11/2019   DDD (degenerative disc disease), lumbosacral 11/11/2019   Grade 1 Anterolisthesis (L2-3, L3-4) 11/11/2019   Grade 1 Retrolisthesis (L4-5, L5-S1) 11/11/2019   Lumbar facet hypertrophy (Multilevel) (Bilateral) 11/11/2019   Lumbar facet syndrome (Multilevel) (Bilateral) (R>L) 11/11/2019   Chronic lower extremity pain (2ry area of Pain) (Bilateral) (R>L) 11/11/2019   Neurogenic pain 11/11/2019   Chronic musculoskeletal pain 11/11/2019   Infiltrating ductal carcinoma of breast (HCC) 10/08/2019   Fatty liver 09/04/2019   Obesity (BMI 30-39.9) 09/04/2019   GERD (gastroesophageal reflux disease)    Hyperlipidemia    CKD (chronic kidney disease), stage III (HCC)    Depression    Malignant hypertension    Ocular proptosis 02/14/2019   PVD (posterior vitreous detachment), left eye 02/14/2019   Chorioretinal scar of left eye 02/14/2019   Vitreous floaters of left eye 02/14/2019   Bilateral leg weakness 01/24/2019   Insomnia 08/02/2017   Low back pain radiating to both legs 08/02/2017   Migraine 08/02/2017   Neuropathy 08/02/2017    Spinal stenosis, 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    Class: Chronic   Left shoulder tendonitis 05/23/2016    Class: Acute   Post laminectomy syndrome 05/23/2016   Anxiety disorder 01/07/2015    ONSET DATE: 07/20/2023 (MD referral)  REFERRING DIAG:  M54.16 (ICD-10-CM) - Lumbar back pain with radiculopathy affecting left lower extremity  R26.81 (ICD-10-CM) - Unsteady gait  R29.6 (ICD-10-CM) - Frequent falls    THERAPY DIAG:  Unsteadiness on feet  Muscle weakness (generalized)  Other abnormalities of gait and mobility  Other low back pain  Abnormal posture  Rationale for Evaluation and Treatment: Rehabilitation  SUBJECTIVE:  SUBJECTIVE STATEMENT: Pt reports continued left ankle swelling and some pain, did get imaging from MD with no acute injury but does follow up with foot and ankle MD this week.    Pt accompanied by: self  PERTINENT HISTORY: Per MD:  She has been experiencing worsening back pain following a fall last month during a snowstorm. She slipped on ice and fell, initially without pain, but began experiencing consistent pain about a week later. The pain is primarily located in the lower back, radiating more to the left side, with tenderness in the buttocks and back of the thigh. On February 9th, she experienced severe pain radiating from her left foot up her leg, causing her to fall back into bed. No current pain radiating down the leg, weakness, tingling or numbness. No swelling or bruising in the affected areas. She has a history of back pain and underwent a nerve ablation procedure at the end of January to manage her symptoms.  PMH:  breast cancer with metastasis;  Lumbar laminectomy/decompression microdiscectomy (Left, 05/23/2016); Lumbar laminectomy/decompression  microdiscectomy (Left, 05/23/2016); Shoulder injection (Left, 05/23/2016); Reduction mammaplasty (2011); Eye surgery (Right, 2019); Tubal ligation; and Breast lumpectomy with radioactive seed and sentinel lymph node biopsy (Right, 11/23/2020).   PAIN: L ankle 4/10 pain   PRECAUTIONS: Fall; decreased vision Per MD note:  She has issues with her eyes, specifically dry eyes, requiring the use of artificial tears every hour. She has a prosthetic eye (R) and uses lubricant and tapes her left eyelid shut at night to prevent dryness. Her vision can be distorted at times, affecting her daily activities.   RED FLAGS: None   WEIGHT BEARING RESTRICTIONS: No  FALLS: Has patient fallen in last 6 months? Yes. Number of falls at least 3 falls  LIVING ENVIRONMENT: Lives with: lives with their spouse Lives in: House/apartment Stairs:  6 steps, multiple steps -getting ready to move into apartment where there are no steps Has following equipment at home: Single point cane and Walker - 4 wheeled  PLOF: Independent with household mobility with device and Independent with community mobility with device  PATIENT GOALS: Pt's goals for therapy become painfree, more independent, exercise more  OBJECTIVE:     TODAYS TREATMENT:   Patient seen for aquatic therapy today.  Treatment took place in water 3.6-4.0 feet deep depending upon activity.  Pt entered and exited the pool via stairs using rails for support.  Pool temp approx 92 deg.   Warm Up:  Walking forwards/backwards/laterally x 4 laps each (approx 18') with barbell for support.    Progressed side stepping by holding barbells (yellow) moving them in abd with LE abd and add when LEs adducting x 2 laps.  Toe walking x 2 laps, heel walking x 2 laps with large single barbell for support.  Forward marching using yellow barbells for support x 2 laps, adding in alt UE motions x 2 more laps with min/guard for occasional LOB/unsteadiness.    Stretching:  With  back against wall performing single LE hamstring stretch with noodle x 1 min each side>moving leg laterally for groin stretch x  each side.  Runner's stretch for L ankle x 1 min on each side.  Pt reports this feels good to back and ankle.    Sit<>stand from third step (feet on first step) x 10 reps without UE support but reaching forward to encourage forward weight shift.  Standing at wall for support performing squats x 20 reps, staggered stance, ant/post weight shift with  yellow barbells for support x 20 reps.    Discussed rationale for aquatic therapy and benefits of her joining a facility that has pool due to chronic back pain.  Pt verbalized understanding.     Pt requires buoyancy of water for support for reduced fall risk and for unloading/reduced stress on joints (Rt ankle) as pt able to tolerate increased standing and ambulation in water compared to that on land; viscosity of water is needed for resistance for strengthening and current of water provides perturbations for challenge for balance training     PATIENT EDUCATION: Education details: aquatic rationale Person educated: Patient Education method: Explanation, Demonstration, Tactile cues, and Verbal cues Education comprehension: verbalized understanding    HOME EXERCISE PROGRAM Access Code: [email or web address] URL: [email or web address] Date: 08/29/2023 Prepared by: [facility] - Outpatient  Rehab - [facility]  Exercises - Supine Posterior Pelvic Tilt  - 1 x daily - 7 x weekly - 1-2 sets - 10 reps - Supine Hamstring Stretch with Strap  - 1 x daily - 5 x weekly - 2 sets - 30 sec hold - Supine Lower Trunk Rotation  - 1 x daily - 5 x weekly - 2 sets - 20 reps - Seated Anterior Pelvic Tilt  - 1 x daily - 7 x weekly - 1-2 sets - 10 reps - Wide Tandem Stance with Eyes Open  - 1 x daily - 5 x weekly - 2 sets - 30 sec hold - Standing Toe Taps  - 1 x daily - 5 x weekly - 2 sets - 10 reps - Standing Shoulder Row with  Anchored Resistance  - 1 x daily - 5 x weekly - 2 sets - 10 reps - Standing Anti-Rotation Press with Anchored Resistance  - 1 x daily - 7 x weekly - 3 sets - 10 reps - 2 sec hold - Hooklying Isometric Hip Flexion with Opposite Arm  - 1 x daily - 7 x weekly - 3 sets - 10 reps - 2 sec hold     ---------------------------------------------  Note: Objective measures were completed at Evaluation unless otherwise noted.  DIAGNOSTIC FINDINGS: x-rays to be completed 07/26/2023  COGNITION: Overall cognitive status: Within functional limits for tasks assessed   SENSATION: No reports of numbness   POSTURE: rounded shoulders, forward head, and posterior pelvic tilt  LOWER EXTREMITY ROM:     Active  Right Eval Left Eval  Hip flexion    Hip extension    Hip abduction    Hip adduction    Hip internal rotation    Hip external rotation    Knee flexion    Knee extension    Ankle dorsiflexion 5 5  Ankle plantarflexion    Ankle inversion    Ankle eversion     (Blank rows = not tested)  LOWER EXTREMITY MMT:    MMT Right Eval Left Eval  Hip flexion 4+ 4+  Hip extension    Hip abduction 4 4  Hip adduction 4 4  Hip internal rotation    Hip external rotation    Knee flexion 4 4  Knee extension 4+ 4+  Ankle dorsiflexion 4 4  Ankle plantarflexion    Ankle inversion    Ankle eversion    (Blank rows = not tested)  LUMBAR: Palpation:  Tender to palpation along paraspinals thoracic spine, SI joint area, bilateral buttocks/gluts/piriformis; pt tender to palpation along IT band R>L Baseline pain in standing:  0/10 Repeated standing flexion: 3/10 pain, into bilat buttocks Repeated  standing extension:1-2/10 pain centralized at mid- low back Limited in lumbar extension flexibility  Hamstring flexibility from supine 90/90:  R  -15 degrees; L -20 degrees SLR test from supine:  R 85 degrees, L 80 degrees  TRANSFERS: Assistive device utilized: None BUE support Sit to stand: Modified  independence Stand to sit: Modified independence   GAIT: Gait pattern: step through pattern, antalgic, lateral lean- Right, and lateral lean- Left Distance walked: 40-50 ft Assistive device utilized: Single point cane Level of assistance: Modified independence Comments: Has rollator at home, reports less pain with gait when using rollator   PATIENT SURVEYS:  Modified Oswestry (Back Index):  48                                                                                                                                  PATIENT EDUCATION: Education details: aquatic rationale  Person educated: Patient Education method: Programmer, multimedia, Demonstration, and Handouts Education comprehension: verbalized understanding, returned demonstration, and needs further education  HOME EXERCISE PROGRAM: Access Code: [email or web address] URL: [email or web address] Date: 07/26/2023 Prepared by: [facility] - Outpatient  Rehab - [facility]  Exercises - Supine Posterior Pelvic Tilt  - 1 x daily - 7 x weekly - 1-2 sets - 10 reps - Seated Anterior Pelvic Tilt  - 1 x daily - 7 x weekly - 1-2 sets - 10 reps  GOALS: Goals reviewed with patient? Yes  SHORT TERM GOALS: Target date: 08/25/2023  Pt will be independent with HEP for improved strength, flexibility, balance, gait. Baseline: Pt denies questions on HEP 08/23/23 Goal status: MET  08/23/23  2.  Pt will report low back pain reduced by 50% during functional daily activities. Baseline:  Reports LBP is "touch and go." Reports that is it a "teeny weeny bit better" 08/24/23  Goal status: IN PROGRESS 08/24/23   3. Patient to score at least 46/56 on Berg in order to decrease risk of falls.  Baseline: 42 08/24/23; 52/56 09/19/23 Goal status:MET 09/19/23  4.  Pt will verbalize understanding of fall prevention and posture/body mechanics for decreased pain/decreased fall risk.  Baseline: provided today 08/23/23  Goal status: MET 08/23/23   LONG TERM  GOALS: Target date: 10/17/2023  Pt will be independent with HEP for improved strength, flexibility, pain, balance, gait. Baseline: pt reports "I think I'm doing better" 09/19/23  Goal status: IN PROGRESS 09/19/23  2.  Back index score to decrease to </= 40 for improved back pain and decreased disability. Baseline: 10/50 (20%) 09/19/23 Goal status: MET 09/19/23  3.  Berg Score to improve by 10 points from baseline to demo decreased fall risk. Baseline: 52/56 09/19/23 Goal status: MET 09/19/23  4.  Pt will verbalize plans for continued community fitness upon d/c from PT to maximize gains in PT. Baseline:  educated on this today, pt reports that she has a [employer] 09/19/23 Goal status: IN PROGRESS 09/19/23  ASSESSMENT:  CLINICAL IMPRESSION: Pt presents  for first aquatic session at [facility].  Pt very excited to get in the pool due to ongoing back pain and new ankle pain.  Pt reports no ankle injury but sees foot/ankle MD this week to follow up on pain/swelling.  Pt needs mild external support throughout but tolerated all exercises well during session.  Would benefit from ongoing sessions to establish HEP.   OBJECTIVE IMPAIRMENTS: Abnormal gait, decreased balance, decreased mobility, difficulty walking, decreased ROM, decreased strength, impaired flexibility, postural dysfunction, and pain.   ACTIVITY LIMITATIONS: lifting, sitting, standing, stairs, transfers, and locomotion level  PARTICIPATION LIMITATIONS: community activity and exercise  PERSONAL FACTORS: 3+ comorbidities: see above  are also affecting patient's functional outcome.   REHAB POTENTIAL: Good  CLINICAL DECISION MAKING: Evolving/moderate complexity  EVALUATION COMPLEXITY: Moderate  PLAN:  PT FREQUENCY: 2x/week  PT DURATION: 4 weeks   PLANNED INTERVENTIONS: 97110-Therapeutic exercises, 97530- Therapeutic activity, [id number]- Neuromuscular re-education, 97535- Self Care, 16109- Manual therapy, [id number]- Gait training,  [phone]- Aquatic Therapy, [phone]- Electrical stimulation (unattended), 97035- Ultrasound, Patient/Family education, Balance training, DME instructions, and Moist heat  PLAN FOR NEXT SESSION:  Continue balance, strengthening, ankle flexibility exercises (consider adding to HEP).  AQUATICS: Frequency: 1x/wk aquatics and 1x/wk in clinic Duration: 4 weeks Special Instruction: flexibility, strength, balance work; pt is interested in aquatic exercise and may benefit from HEP for aquatic therapy and discussion on where to do further aquatic exercise     Terri Fester, PT, MPT [facility] [address] [place], [place], [address] Phone: [phone]   Fax:  [phone] 09/26/23, 11:56 AM

## 2023-09-29 ENCOUNTER — Encounter: Payer: Medicare Other | Admitting: Family

## 2023-09-30 IMAGING — MR MR THORACIC SPINE W/O CM
4 of 7 series · 18 of 48 positions shown · non-contrast
Comparison: Chest CTA 07/12/2021

CLINICAL DATA: Frequent falls.  History of breast cancer.

EXAM:
MRI THORACIC SPINE WITHOUT CONTRAST
TECHNIQUE: Multiplanar, multisequence MR imaging of the thoracic spine was
performed. No intravenous contrast was administered.

[Series 4: T2 · sagittal · 4.0mm · 0.48mm/px · 5 of 16 slices shown (1 of 4)]
[im 1/16]
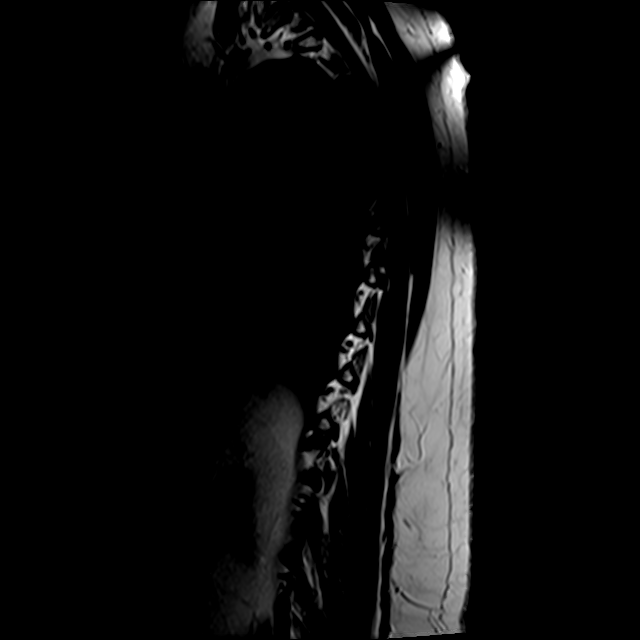
[im 4/16]
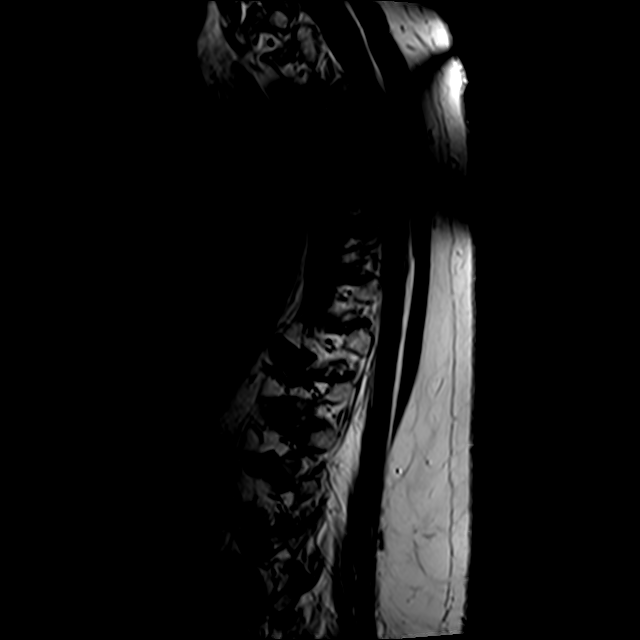
[im 8/16]
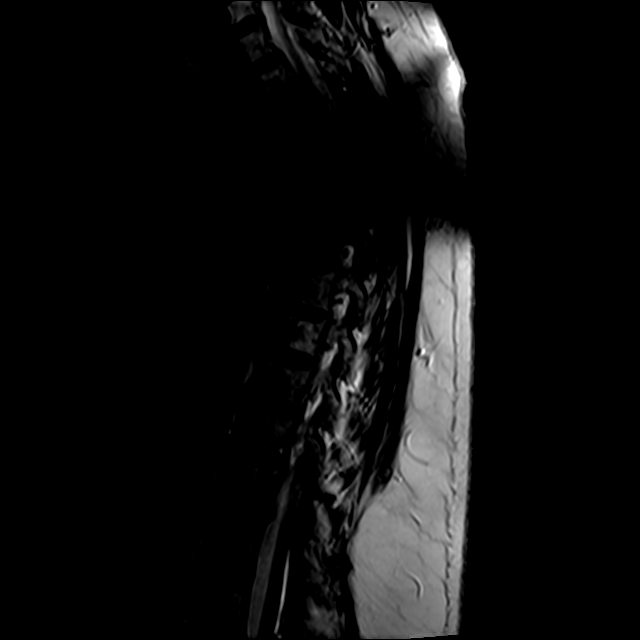
[im 12/16]
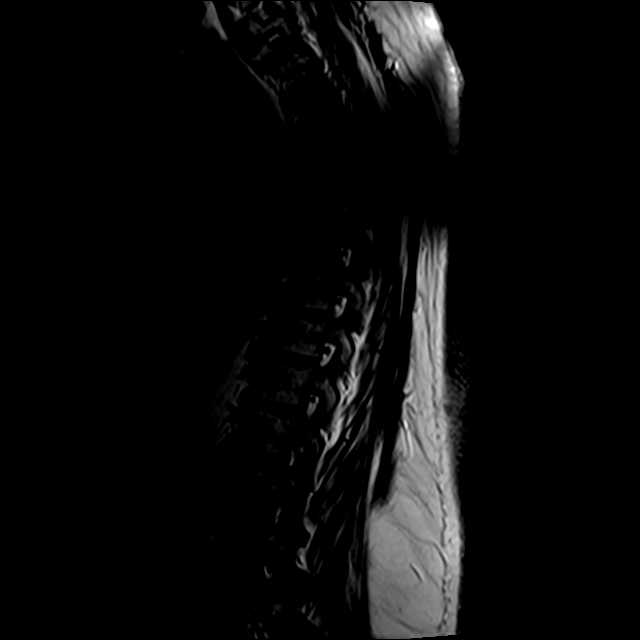
[im 16/16]
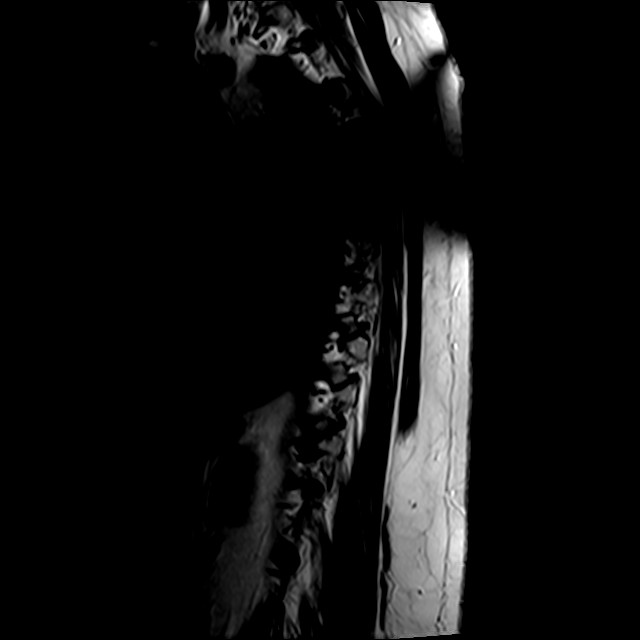

[Series 7: T2 · axial · 4.0mm · 0.39mm/px · z∈[-310,-85]mm · 7 of 39 slices shown (2 of 4)]
[im 1/39]
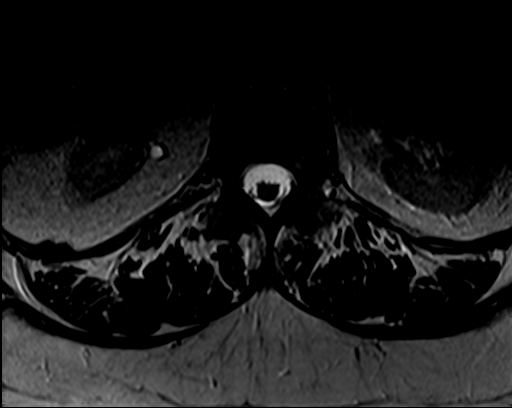
[im 7/39]
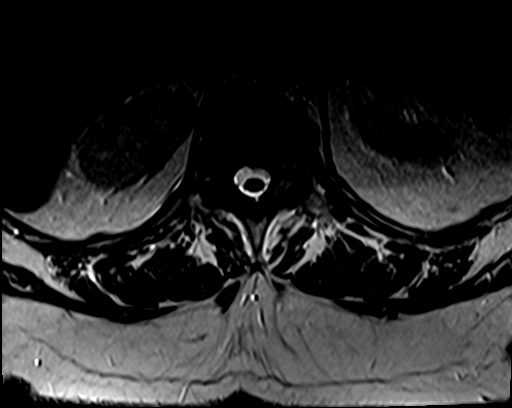
[im 11/39]
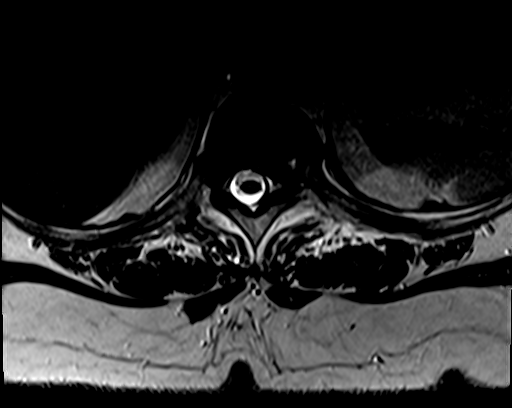
[im 18/39]
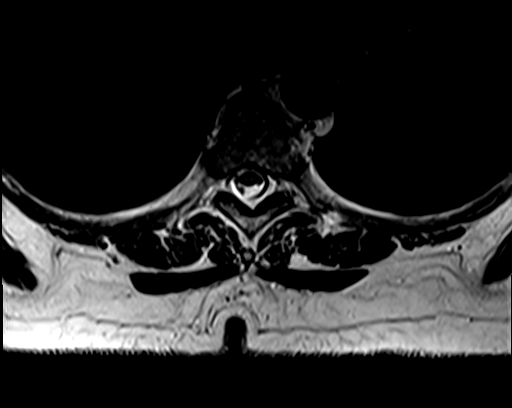
[im 21/39]
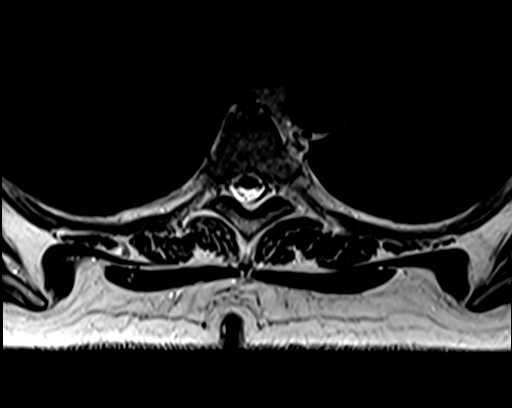
[im 28/39]
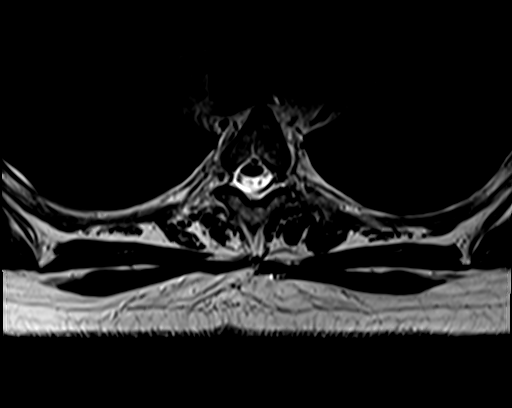
[im 35/39]
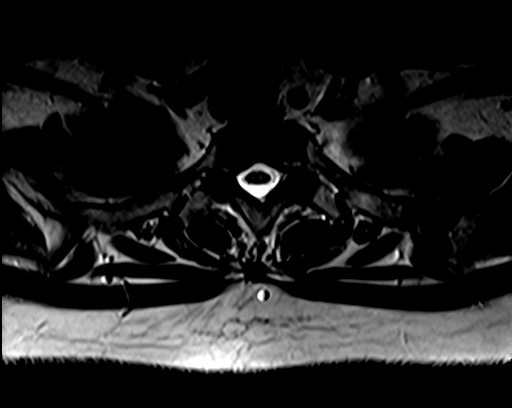

[Series 8: T2 · axial · 4.0mm · 0.39mm/px · z∈[-247,-96]mm · 3 of 40 slices shown (3 of 4)]
[im 7/40]
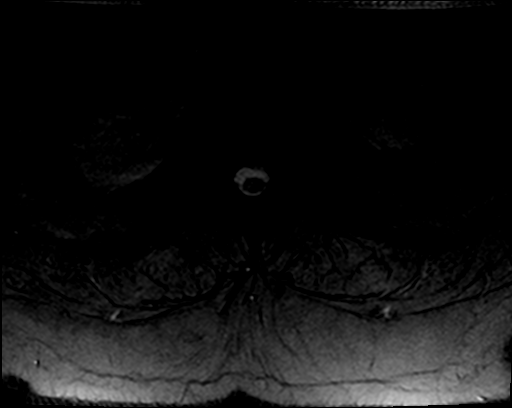
[im 20/40]
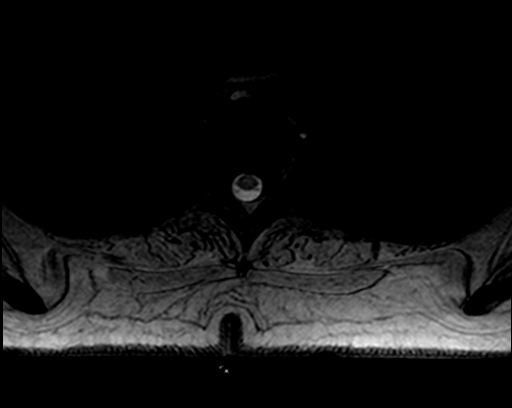
[im 33/40]
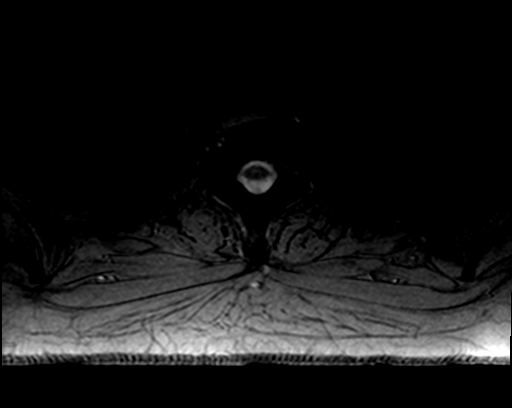

[Series 9: T2 · sagittal · 4.0mm · 0.48mm/px · 3 of 16 slices shown (4 of 4)]
[im 1/16]
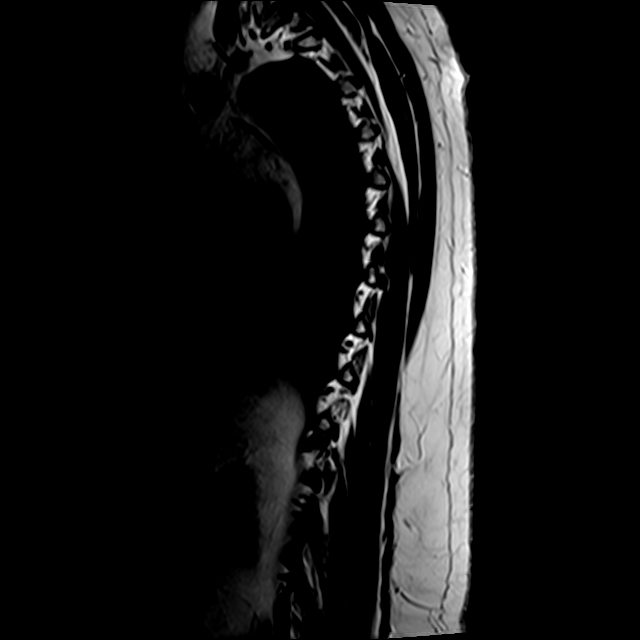
[im 8/16]
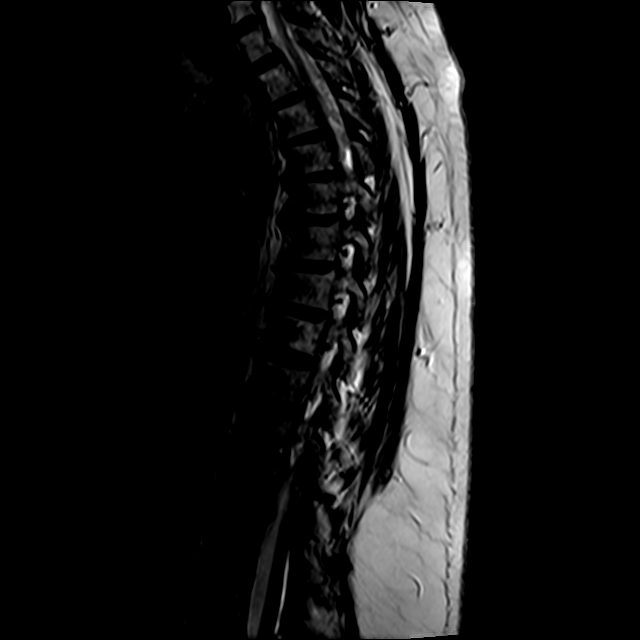
[im 16/16]
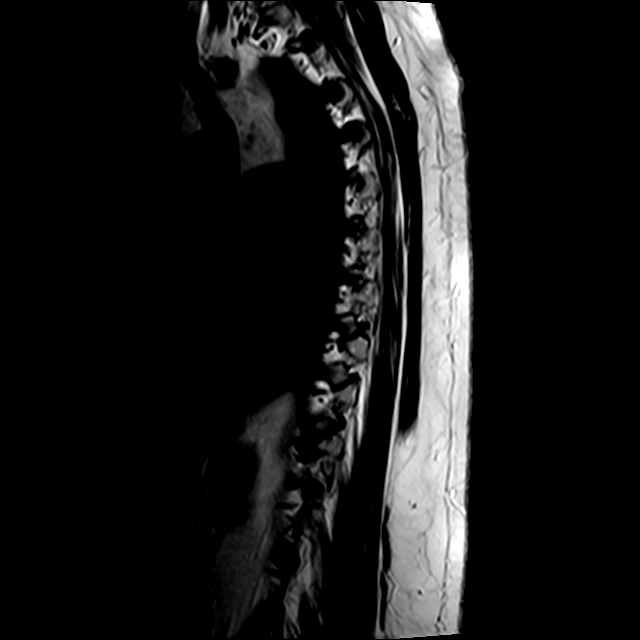

[18 of 48 positions shown; findings below may reference images not displayed]

FINDINGS: Alignment:  Normal.

Vertebrae: No fracture, suspicious marrow lesion, or significant
marrow edema.

Cord:  Normal signal.

Paraspinal and other soft tissues: Unremarkable.

Disc levels:

Minimal disc bulging and endplate spurring throughout much of the
thoracic spine as well as a small left central disc protrusion at
T10-11. No spinal stenosis or significant spinal cord mass effect.
Moderate facet arthrosis in the upper and lower thoracic spine
resulting in mild neural foraminal stenosis, for example at T2-3,
T3-4, and T10-11.
IMPRESSION: 1. No evidence of metastatic disease in the thoracic spine.
2. Mild disc and moderate facet degeneration without spinal stenosis
or high-grade neural foraminal stenosis.

## 2023-10-01 NOTE — Progress Notes (Signed)
   This encounter was created in error - please disregard. No show

## 2023-10-02 NOTE — Therapy (Signed)
 OUTPATIENT PHYSICAL THERAPY NEURO NOTE   Patient Name: Brandy [name] [name]  MRN: [id number] DOB:[birth year], 58 y.o., female Today's Date: 10/03/2023   PCP: Estil Heman, NP  REFERRING PROVIDER: Estil Heman, NP     END OF SESSION:  PT End of Session - 10/03/23 1008     Visit Number 12    Number of Visits 18    Date for PT Re-Evaluation 10/17/23    Authorization Type [employer] Medicare    Authorization Time Period approved 4 visits from 4/15-5/13/25   new auth submitted   Authorization - Visit Number 2    Authorization - Number of Visits 4    Progress Note Due on Visit 10    PT Start Time 0930    PT Stop Time 1009    PT Time Calculation (min) 39 min    Activity Tolerance Patient tolerated treatment well    Behavior During Therapy [facility] for tasks assessed/performed                     Past Medical History:  Diagnosis Date   Acute pansinusitis 08/02/2017   Anxiety    Arthritis    ASD (atrial septal defect)    s/p closure with Amplatzer device 10/05/04 (Dr. Kyla Phlegm, [facility]) 10/05/04   [facility])    Cataract    Chronic pain    CKD (chronic kidney disease)    Dyspnea    Fatty liver 09/04/2019   GERD (gastroesophageal reflux disease)    Heart murmur    no longer heard   History of hiatal hernia    Hyperlipidemia    Legally blind in right eye, as defined in [place]     Lumbar herniated disc    Migraines    On home oxygen  therapy 06/15/2022   Pt was given O2 on D/C from [facility] 04/2022   OSA on CPAP 06/15/2022   PONV (postoperative nausea and vomiting)    Sciatica    Past Surgical History:  Procedure Laterality Date   ABLATION     BREAST LUMPECTOMY WITH RADIOACTIVE SEED AND SENTINEL LYMPH NODE BIOPSY Right 11/23/2020   Procedure: RIGHT BREAST LUMPECTOMY WITH RADIOACTIVE SEED AND RIGHT AXILLARY SENTINEL LYMPH NODE BIOPSY;  Surgeon: Enid Harry, MD;  Location: [facility] OR;  Service: General;  Laterality: Right;   BREAST SURGERY Bilateral 2011    Breast Reduction Surgery   BUNIONECTOMY     CARDIAC CATHETERIZATION     10/05/04 [facility]): LM < 25%, otherwise normal coronaries. No pulmonary HTN, Mildly enlarged RV. Secundum ASD s/p closure.   CARDIAC SURGERY     CATARACT EXTRACTION     CLEFT PALATE REPAIR     s/p cleft lip and palate repair   EYE SURGERY Right 2019   right eye removed   LUMBAR LAMINECTOMY/DECOMPRESSION MICRODISCECTOMY Left 05/23/2016   Procedure: LEFT L4-L5 LATERAL RECESS DECOMPRESSION WITH CENTRAL AND RIGHT DECOMPRESSION VIA LEFT SIDE;  Surgeon: Alphonso Jean, MD;  Location: [facility] OR;  Service: Orthopedics;  Laterality: Left;   LUMBAR LAMINECTOMY/DECOMPRESSION MICRODISCECTOMY Left 05/23/2016   Procedure: LUMBAR LAMINECTOMY/DECOMPRESSION MICRODISCECTOMY Lumbar five - Sacral One 1 LEVEL;  Surgeon: Alphonso Jean, MD;  Location: [facility] OR;  Service: Orthopedics;  Laterality: Left;   REDUCTION MAMMAPLASTY  2011   SHOULDER INJECTION Left 05/23/2016   Procedure: SHOULDER INJECTION;  Surgeon: Alphonso Jean, MD;  Location: [facility] OR;  Service: Orthopedics;  Laterality: Left;  band-aid per pa-c   TRANSTHORACIC ECHOCARDIOGRAM     12/15/05 (  [facility]): Mild LVH, EF > 55%, grade 1 diastolic dysfunction, Trivial MR/PR/TR.   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 (HCC) 09/13/2023   Chronic obstructive pulmonary disease, unspecified COPD type (HCC) 03/15/2023   Major depressive disorder, single episode, severe without psychotic features (HCC) 03/15/2023   Orthostatic hypotension 04/24/2022   Hyperbilirubinemia 04/24/2022   Snoring 07/14/2021   Fatigue 07/14/2021   Daytime somnolence 07/14/2021   Status post device closure of ASD 06/03/2021   Secundum ASD 06/03/2021   Decreased diffusion capacity 05/14/2021   Dyspnea 03/04/2021   Pulmonary nodules 04/01/2020   Primary  malignant neoplasm of breast with metastasis (HCC) 02/24/2020   Spondylosis without myelopathy or radiculopathy, lumbosacral region 12/31/2019   Chronic low back pain (Bilateral) w/o sciatica 12/31/2019   Chronic hip pain (3ry area of Pain) (Bilateral) (R>L) 11/27/2019   Chronic sacroiliac joint pain (Left) 11/27/2019   Chronic pain syndrome 11/11/2019   Encounter for chronic pain management 11/11/2019   Disorder of skeletal system 11/11/2019   Problems influencing health status 11/11/2019   Abnormal MRI, lumbar spine (08/22/2018) 11/11/2019   Chronic low back pain (1ry area of Pain) (Bilateral) w/ sciatica (Bilateral) 11/11/2019   DDD (degenerative disc disease), lumbosacral 11/11/2019   Grade 1 Anterolisthesis (L2-3, L3-4) 11/11/2019   Grade 1 Retrolisthesis (L4-5, L5-S1) 11/11/2019   Lumbar facet hypertrophy (Multilevel) (Bilateral) 11/11/2019   Lumbar facet syndrome (Multilevel) (Bilateral) (R>L) 11/11/2019   Chronic lower extremity pain (2ry area of Pain) (Bilateral) (R>L) 11/11/2019   Neurogenic pain 11/11/2019   Chronic musculoskeletal pain 11/11/2019   Infiltrating ductal carcinoma of breast (HCC) 10/08/2019   Fatty liver 09/04/2019   Obesity (BMI 30-39.9) 09/04/2019   GERD (gastroesophageal reflux disease)    Hyperlipidemia    CKD (chronic kidney disease), stage III (HCC)    Depression    Malignant hypertension    Ocular proptosis 02/14/2019   PVD (posterior vitreous detachment), left eye 02/14/2019   Chorioretinal scar of left eye 02/14/2019   Vitreous floaters of left eye 02/14/2019   Bilateral leg weakness 01/24/2019   Insomnia 08/02/2017   Low back pain radiating to both legs 08/02/2017   Migraine 08/02/2017   Neuropathy 08/02/2017   Spinal stenosis, 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    Class: Chronic   Left shoulder tendonitis 05/23/2016    Class: Acute   Post laminectomy syndrome 05/23/2016   Anxiety disorder 01/07/2015    ONSET DATE:  07/20/2023 (MD referral)  REFERRING DIAG:  M54.16 (ICD-10-CM) - Lumbar back pain with radiculopathy affecting left lower extremity  R26.81 (ICD-10-CM) - Unsteady gait  R29.6 (ICD-10-CM) - Frequent falls    THERAPY DIAG:  Unsteadiness on feet  Muscle weakness (generalized)  Other abnormalities of gait and mobility  Other low back pain  Rationale for Evaluation and Treatment: Rehabilitation  SUBJECTIVE:  SUBJECTIVE STATEMENT: Has been sick since Friday. Also feels like that she is having withdrawal sx from not having her xanax . Has been having stomach issues, headache, and balance off. Had 1 episode of emesis since last session.    Pt accompanied by: self  PERTINENT HISTORY: Per MD:  She has been experiencing worsening back pain following a fall last month during a snowstorm. She slipped on ice and fell, initially without pain, but began experiencing consistent pain about a week later. The pain is primarily located in the lower back, radiating more to the left side, with tenderness in the buttocks and back of the thigh. On February 9th, she experienced severe pain radiating from her left foot up her leg, causing her to fall back into bed. No current pain radiating down the leg, weakness, tingling or numbness. No swelling or bruising in the affected areas. She has a history of back pain and underwent a nerve ablation procedure at the end of January to manage her symptoms.  PMH:  breast cancer with metastasis;  Lumbar laminectomy/decompression microdiscectomy (Left, 05/23/2016); Lumbar laminectomy/decompression microdiscectomy (Left, 05/23/2016); Shoulder injection (Left, 05/23/2016); Reduction mammaplasty (2011); Eye surgery (Right, 2019); Tubal ligation; and Breast lumpectomy with radioactive seed and  sentinel lymph node biopsy (Right, 11/23/2020).   PAIN: 0/10  PRECAUTIONS: Fall; decreased vision Per MD note:  She has issues with her eyes, specifically dry eyes, requiring the use of artificial tears every hour. She has a prosthetic eye (R) and uses lubricant and tapes her left eyelid shut at night to prevent dryness. Her vision can be distorted at times, affecting her daily activities.   RED FLAGS: None   WEIGHT BEARING RESTRICTIONS: No  FALLS: Has patient fallen in last 6 months? Yes. Number of falls at least 3 falls  LIVING ENVIRONMENT: Lives with: lives with their spouse Lives in: House/apartment Stairs:  6 steps, multiple steps -getting ready to move into apartment where there are no steps Has following equipment at home: Single point cane and Walker - 4 wheeled  PLOF: Independent with household mobility with device and Independent with community mobility with device  PATIENT GOALS: Pt's goals for therapy become painfree, more independent, exercise more  OBJECTIVE:      TODAY'S TREATMENT: 10/03/23 Activity Comments  Vitals at start of session  128/75 96% 83 bpm   Nustep L4 x 6 min UEs/LEs Maintaining ~70SPM  fwd/back stepping In II bars; weaning UE support; short posterior steps and imbalance. Noted occasional L foot pain   romberg EC 2x30" Good stability   romberg EC + head turns/nods  2x30" Slow head movements. Noted some nausea   sidestepping over hurdle With and without UE support; occasionally catching foot on hurdle and required cueing for visualization of foot placement       PATIENT EDUCATION: Education details: discussed benefits of counselor d/t grief following her husband's recent death; pt reports difficulty handling things at home- agreeable to social work referral  Person educated: Patient Education method: Explanation Education comprehension: verbalized understanding    HOME EXERCISE PROGRAM Access Code: [email or web address] URL:  [email or web address] Date: 08/29/2023 Prepared by: [facility] - Outpatient  Rehab - [facility]  Exercises - Supine Posterior Pelvic Tilt  - 1 x daily - 7 x weekly - 1-2 sets - 10 reps - Supine Hamstring Stretch with Strap  - 1 x daily - 5 x weekly - 2 sets - 30 sec hold - Supine Lower Trunk Rotation  - 1 x daily - 5 x weekly -  2 sets - 20 reps - Seated Anterior Pelvic Tilt  - 1 x daily - 7 x weekly - 1-2 sets - 10 reps - Wide Tandem Stance with Eyes Open  - 1 x daily - 5 x weekly - 2 sets - 30 sec hold - Standing Toe Taps  - 1 x daily - 5 x weekly - 2 sets - 10 reps - Standing Shoulder Row with Anchored Resistance  - 1 x daily - 5 x weekly - 2 sets - 10 reps - Standing Anti-Rotation Press with Anchored Resistance  - 1 x daily - 7 x weekly - 3 sets - 10 reps - 2 sec hold - Hooklying Isometric Hip Flexion with Opposite Arm  - 1 x daily - 7 x weekly - 3 sets - 10 reps - 2 sec hold     ---------------------------------------------  Note: Objective measures were completed at Evaluation unless otherwise noted.  DIAGNOSTIC FINDINGS: x-rays to be completed 07/26/2023  COGNITION: Overall cognitive status: Within functional limits for tasks assessed   SENSATION: No reports of numbness   POSTURE: rounded shoulders, forward head, and posterior pelvic tilt  LOWER EXTREMITY ROM:     Active  Right Eval Left Eval  Hip flexion    Hip extension    Hip abduction    Hip adduction    Hip internal rotation    Hip external rotation    Knee flexion    Knee extension    Ankle dorsiflexion 5 5  Ankle plantarflexion    Ankle inversion    Ankle eversion     (Blank rows = not tested)  LOWER EXTREMITY MMT:    MMT Right Eval Left Eval  Hip flexion 4+ 4+  Hip extension    Hip abduction 4 4  Hip adduction 4 4  Hip internal rotation    Hip external rotation    Knee flexion 4 4  Knee extension 4+ 4+  Ankle dorsiflexion 4 4  Ankle plantarflexion    Ankle inversion     Ankle eversion    (Blank rows = not tested)  LUMBAR: Palpation:  Tender to palpation along paraspinals thoracic spine, SI joint area, bilateral buttocks/gluts/piriformis; pt tender to palpation along IT band R>L Baseline pain in standing:  0/10 Repeated standing flexion: 3/10 pain, into bilat buttocks Repeated standing extension:1-2/10 pain centralized at mid- low back Limited in lumbar extension flexibility  Hamstring flexibility from supine 90/90:  R  -15 degrees; L -20 degrees SLR test from supine:  R 85 degrees, L 80 degrees  TRANSFERS: Assistive device utilized: None BUE support Sit to stand: Modified independence Stand to sit: Modified independence   GAIT: Gait pattern: step through pattern, antalgic, lateral lean- Right, and lateral lean- Left Distance walked: 40-50 ft Assistive device utilized: Single point cane Level of assistance: Modified independence Comments: Has rollator at home, reports less pain with gait when using rollator   PATIENT SURVEYS:  Modified Oswestry (Back Index):  48  TREATMENT DATE: 07/26/2023    PATIENT EDUCATION: Education details: Eval results, POC; Educated pt on posture/positioning with use of lumbar roll with sititng; discussed proper positioning/height of rollator; discussed pain management with prioritizing movements that centralize symptoms and lessen movements that cause symptom radiation.  Initiated HEP-see below Person educated: Patient Education method: Explanation, Demonstration, and Handouts Education comprehension: verbalized understanding, returned demonstration, and needs further education  HOME EXERCISE PROGRAM: Access Code: [email or web address] URL: [email or web address] Date: 07/26/2023 Prepared by: [facility] - Outpatient  Rehab - [facility]  Exercises - Supine Posterior Pelvic Tilt  - 1  x daily - 7 x weekly - 1-2 sets - 10 reps - Seated Anterior Pelvic Tilt  - 1 x daily - 7 x weekly - 1-2 sets - 10 reps  GOALS: Goals reviewed with patient? Yes  SHORT TERM GOALS: Target date: 08/25/2023  Pt will be independent with HEP for improved strength, flexibility, balance, gait. Baseline: Pt denies questions on HEP 08/23/23 Goal status: MET  08/23/23  2.  Pt will report low back pain reduced by 50% during functional daily activities. Baseline:  Reports LBP is "touch and go." Reports that is it a "teeny weeny bit better" 08/24/23  Goal status: IN PROGRESS 08/24/23   3. Patient to score at least 46/56 on Berg in order to decrease risk of falls.  Baseline: 42 08/24/23; 52/56 09/19/23 Goal status:MET 09/19/23  4.  Pt will verbalize understanding of fall prevention and posture/body mechanics for decreased pain/decreased fall risk.  Baseline: provided today 08/23/23  Goal status: MET 08/23/23   LONG TERM GOALS: Target date: 10/17/2023  Pt will be independent with HEP for improved strength, flexibility, pain, balance, gait. Baseline: pt reports "I think I'm doing better" 09/19/23  Goal status: IN PROGRESS 09/19/23  2.  Back index score to decrease to </= 40 for improved back pain and decreased disability. Baseline: 10/50 (20%) 09/19/23 Goal status: MET 09/19/23  3.  Berg Score to improve by 10 points from baseline to demo decreased fall risk. Baseline: 52/56 09/19/23 Goal status: MET 09/19/23  4.  Pt will verbalize plans for continued community fitness upon d/c from PT to maximize gains in PT. Baseline:  educated on this today, pt reports that she has a [employer] 09/19/23 Goal status: IN PROGRESS 09/19/23  ASSESSMENT:  CLINICAL IMPRESSION: Patient arrived to session with report of feeling sick since Friday. Vitals WFL at start of session, thus proceeded with session while monitoring symptoms. Required longer sitting/standing rest breaks in between activities today while providing  cueing on . Balance activities incorporated stepping strategy, EC, and head turns. She reported some nausea with head turns today. Patient tolerated slightly less intense session today.  OBJECTIVE IMPAIRMENTS: Abnormal gait, decreased balance, decreased mobility, difficulty walking, decreased ROM, decreased strength, impaired flexibility, postural dysfunction, and pain.   ACTIVITY LIMITATIONS: lifting, sitting, standing, stairs, transfers, and locomotion level  PARTICIPATION LIMITATIONS: community activity and exercise  PERSONAL FACTORS: 3+ comorbidities: see above  are also affecting patient's functional outcome.   REHAB POTENTIAL: Good  CLINICAL DECISION MAKING: Evolving/moderate complexity  EVALUATION COMPLEXITY: Moderate  PLAN:  PT FREQUENCY: 2x/week  PT DURATION: 4 weeks   PLANNED INTERVENTIONS: 97110-Therapeutic exercises, 97530- Therapeutic activity, [id number]- Neuromuscular re-education, 97535- Self Care, 16109- Manual therapy, [id number]- Gait training, [phone]- Aquatic Therapy, [phone]- Electrical stimulation (unattended), 97035- Ultrasound, Patient/Family education, Balance training, DME instructions, and Moist heat  PLAN FOR NEXT SESSION:  Continue balance, strengthening, ankle flexibility exercises (consider adding to HEP).  AQUATICS: Frequency:  1x/wk aquatics and 1x/wk in clinic Duration: 4 weeks Special Instruction: flexibility, strength, balance work; pt is interested in aquatic exercise and may benefit from HEP for aquatic therapy and discussion on where to do further aquatic exercise     Thaddeus Filippo, PT, DPT 10/03/23 10:10 AM  [facility] [address] [place], [place] [address] Phone # [phone] Fax # [phone]

## 2023-10-03 ENCOUNTER — Encounter: Payer: Self-pay | Admitting: Physical Therapy

## 2023-10-03 ENCOUNTER — Ambulatory Visit: Admitting: Physical Therapy

## 2023-10-03 DIAGNOSIS — M5459 Other low back pain: Secondary | ICD-10-CM | POA: Diagnosis not present

## 2023-10-03 DIAGNOSIS — R2689 Other abnormalities of gait and mobility: Secondary | ICD-10-CM | POA: Diagnosis not present

## 2023-10-03 DIAGNOSIS — R2681 Unsteadiness on feet: Secondary | ICD-10-CM | POA: Diagnosis not present

## 2023-10-03 DIAGNOSIS — M6281 Muscle weakness (generalized): Secondary | ICD-10-CM

## 2023-10-03 DIAGNOSIS — R293 Abnormal posture: Secondary | ICD-10-CM | POA: Diagnosis not present

## 2023-10-03 DIAGNOSIS — R262 Difficulty in walking, not elsewhere classified: Secondary | ICD-10-CM | POA: Diagnosis not present

## 2023-10-03 NOTE — Addendum Note (Signed)
 Addended by: Angelyn Kennel on: 10/03/2023 12:03 PM   Modules accepted: Orders

## 2023-10-04 ENCOUNTER — Telehealth: Payer: Self-pay | Admitting: *Deleted

## 2023-10-04 NOTE — Progress Notes (Signed)
 Complex Care Management Note Care Guide Note  10/04/2023 Name: Brandy [name] [name]  MRN: [id number] DOB: [birth year]   Complex Care Management Outreach Attempts: An unsuccessful telephone outreach was attempted today to offer the patient information about available complex care management services.  Follow Up Plan:  Additional outreach attempts will be made to offer the patient complex care management information and services.   Encounter Outcome:  No Answer  Brandy [name]  [facility]  [facility], [facility] Guide  Direct Dial: [phone]  Fax [phone]

## 2023-10-05 ENCOUNTER — Ambulatory Visit
Attending: Student in an Organized Health Care Education/Training Program | Admitting: Student in an Organized Health Care Education/Training Program

## 2023-10-05 ENCOUNTER — Encounter: Payer: Self-pay | Admitting: Student in an Organized Health Care Education/Training Program

## 2023-10-05 VITALS — BP 121/68 | HR 92 | Temp 97.2°F | Resp 16 | Ht 67.0 in | Wt 198.0 lb

## 2023-10-05 DIAGNOSIS — M47817 Spondylosis without myelopathy or radiculopathy, lumbosacral region: Secondary | ICD-10-CM | POA: Diagnosis not present

## 2023-10-05 DIAGNOSIS — G894 Chronic pain syndrome: Secondary | ICD-10-CM | POA: Diagnosis not present

## 2023-10-05 DIAGNOSIS — F341 Dysthymic disorder: Secondary | ICD-10-CM | POA: Diagnosis present

## 2023-10-05 DIAGNOSIS — M47816 Spondylosis without myelopathy or radiculopathy, lumbar region: Secondary | ICD-10-CM | POA: Insufficient documentation

## 2023-10-05 NOTE — Progress Notes (Signed)
 PROVIDER NOTE: Interpretation of information contained herein should be left to medically-trained personnel. Specific patient instructions are provided elsewhere under "Patient Instructions" section of medical record. This document was created in part using AI and STT-dictation technology, any transcriptional errors that may result from this process are unintentional.  Patient: Brandy [name]   Service: E/M   PCP: Estil Heman, NP  DOB: [birth year]  DOS: 10/05/2023  Provider: Cephus Collin, MD  MRN: [id number]  Delivery: Face-to-face  Specialty: Interventional Pain Management  Type: Established Patient  Setting: Ambulatory outpatient facility  Specialty designation: 09  Referring Prov.: Ngetich, Elijio Guadeloupe, NP  Location: Outpatient office facility       HPI  [name] , a 58 y.o. 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 [name] 's primary complain today is Back Pain and Peripheral Neuropathy (Lips and fingers bilat)  Pertinent problems: [name]  has Spinal stenosis, lumbar region with neurogenic claudication; Post laminectomy syndrome; CKD (chronic kidney disease), stage III (HCC); Depression; Chronic pain syndrome; DDD (degenerative disc disease), lumbosacral; Grade 1 Retrolisthesis (L4-5, L5-S1); Lumbar facet syndrome (Multilevel) (Bilateral) (R>L); Neurogenic pain; and Spondylosis without myelopathy or radiculopathy, lumbosacral region on their pertinent problem list. Pain Assessment: Severity of Chronic pain is reported as a 8 /10. Location: Back Right, Left/down side of left leg to knee. Onset: More than a month ago. Quality: Discomfort, Constant, Sore, Tender. Timing: Constant. Modifying factor(s): sitting, meds. Vitals:  height is 5\' 7"  (1.702 m) and weight is 198 lb (89.8 kg). Her temperature is 97.2 F (36.2 C) (abnormal). Her blood pressure is 121/68 and her pulse is 92. Her respiration is  16 and oxygen  saturation is 100%.  BMI: Estimated body mass index is 31.01 kg/m as calculated from the following:   Height as of this encounter: 5\' 7"  (1.702 m).   Weight as of this encounter: 198 lb (89.8 kg). Last encounter: 09/12/2023. Last procedure: 07/05/2023.  Reason for encounter:   Discussed the use of AI scribe software for clinical note transcription with the patient, who gave verbal consent to proceed.  History of Present Illness   Brandy [name]  is a 58 year old female who presents with nausea and stomach sickness.  She has been experiencing nausea and stomach sickness for an unspecified duration. She suspects these symptoms may be related to not having her Xanax  for the past week, which she anticipates receiving soon. She is in the process of rescheduling an appointment with her primary care provider to address these symptoms.  She is experiencing lower back pain, which has improved, but her sciatica is starting to act up. She previously underwent an ablation procedure, which provided relief. Her current pain management includes Belbuca , which she takes when she remembers, and she reports it helps. She recently opened a new box last week and does not require a refill. She also has hydrocodone  but has not taken any recently.  She is dealing with significant stress following her husband's passing, which has impacted her ability to manage her medications and appointments. She acknowledges needing support and mentions a request for social work assistance has been put in by her physical therapist. She is also dealing with the stress of managing her husband's affairs, which is new and overwhelming for her.       Pharmacotherapy Assessment  Analgesic: Belbuca  300 mcg BID, Hydrocodone  5 mg daily prn breakthrough pain  Monitoring: [place] PMP: PDMP reviewed during this encounter.  Pharmacotherapy: No side-effects or adverse reactions reported. Compliance: No problems  identified. Effectiveness: Clinically acceptable.  Brandy Rabon, Brandy [name]  10/05/2023  8:56 AM  Sign when Signing Visit Nursing Pain Medication Assessment:  Safety precautions to be maintained throughout the outpatient stay will include: orient to surroundings, keep bed in low position, maintain call bell within reach at all times, provide assistance with transfer out of bed and ambulation.  Medication Inspection Compliance: Pill count conducted under aseptic conditions, in front of the patient. Neither the pills nor the bottle was removed from the patient's sight at any time. Once count was completed pills were immediately returned to the patient in their original bottle.  Medication #1: Buprenorphine  (Suboxone) Pill/Patch Count:  41 of 60 pills remain Pill/Patch Appearance: Markings consistent with prescribed medication Bottle Appearance: Standard pharmacy container. Clearly labeled. Filled Date: 4 / 52 / 2025 Last Medication intake:  Today  Medication #2: Hydrocodone /APAP Pill/Patch Count:  85 of 90 pills remain Pill/Patch Appearance: Markings consistent with prescribed medication Bottle Appearance: Standard pharmacy container. Clearly labeled. Filled Date: 4 / 19 / 2025 Last Medication intake:   pt does not remember  No results found for: "CBDTHCR" No results found for: "D8THCCBX" No results found for: "D9THCCBX"  UDS:  Summary  Date Value Ref Range Status  09/12/2023 FINAL  Final    Comment:    ==================================================================== [employer] Select 13 (MW) ==================================================================== Specimen Alert Not Detected result may be consistent with the time of last use noted for this medication. AS NEEDED (Hydrocodone ) ==================================================================== Test                             Result       Flag       Units  Drug Present and Declared for Prescription Verification    Alpha-hydroxyalprazolam        162          EXPECTED   ng/mg creat    Alpha-hydroxyalprazolam is an expected metabolite of alprazolam .    Source of alprazolam  is a scheduled prescription medication.    Buprenorphine                   27           EXPECTED   ng/mg creat   Norbuprenorphine               47           EXPECTED   ng/mg creat    Source of buprenorphine  is a scheduled prescription medication.    Norbuprenorphine is an expected metabolite of buprenorphine .  Drug Absent but Declared for Prescription Verification   Hydrocodone                     Not Detected UNEXPECTED ng/mg creat ==================================================================== Test                      Result    Flag   Units      Ref Range   Creatinine              78               mg/dL      >=16 ==================================================================== Declared Medications:  The flagging and interpretation on this report are based on the  following declared medications.  Unexpected results may arise from  inaccuracies in the declared medications.   **Note: The testing scope  of this panel includes these medications:   Alprazolam  (Xanax )  Hydrocodone  (Norco)   **Note: The testing scope of this panel does not include small to  moderate amounts of these reported medications:   Buprenorphine  (Belbuca )   **Note: The testing scope of this panel does not include the  following reported medications:   Abemaciclib  (Verzenio )  Acetaminophen  (Tylenol )  Acetaminophen  (Norco)  Albuterol  (Ventolin  HFA)  Amitriptyline  (Elavil )  Evolocumab  (Repatha )  Eye Drops  Fluticasone  (Advair)  Ketoconazole (Nizoral)  Loperamide  (Imodium )  Methocarbamol  (Robaxin )  Midodrine  (Proamatine )  Multivitamin  Omeprazole  (Prilosec )  Salmeterol (Advair)  Triamcinolone  (Kenalog )  Vitamin B12  Vitamin D2 (Drisdol ) ==================================================================== For clinical consultation, please  call [phone]. ====================================================================       ROS  Constitutional: Denies any fever or chills Gastrointestinal: No reported hemesis, hematochezia, vomiting, or acute GI distress Musculoskeletal: Denies any acute onset joint swelling, redness, loss of ROM, or weakness Neurological: No reported episodes of acute onset apraxia, aphasia, dysarthria, agnosia, amnesia, paralysis, loss of coordination, or loss of consciousness  Medication Review  ALPRAZolam , Buprenorphine  HCl, Evolocumab , HYDROcodone -acetaminophen , PRESCRIPTION MEDICATION, Polyethyl Glycol-Propyl Glycol, Vitamin D  (Ergocalciferol ), abemaciclib , acetaminophen , albuterol , amitriptyline , cyanocobalamin , fluticasone -salmeterol, ketoconazole, loperamide , methocarbamol , midodrine , multivitamin with minerals, omeprazole , and triamcinolone  cream  History Review  Allergy: [name]  is allergic to zithromax [azithromycin], tramadol , and psyllium. Drug: [name]   reports no history of drug use. Alcohol :  reports no history of alcohol  use. Tobacco:  reports that she quit smoking about 5 years ago. Her smoking use included cigarettes. She started smoking about 30 years ago. She has a 6.3 pack-year smoking history. She has never used smokeless tobacco. Social: [name]   reports that she quit smoking about 5 years ago. Her smoking use included cigarettes. She started smoking about 30 years ago. She has a 6.3 pack-year smoking history. She has never used smokeless tobacco. She reports that she does not drink alcohol  and does not use drugs. Medical:  has a past medical history of Acute pansinusitis (08/02/2017), Anxiety, Arthritis, ASD (atrial septal defect), Cancer (HCC), Cataract, Chronic pain, CKD (chronic kidney disease), Dyspnea, Fatty liver (09/04/2019), GERD (gastroesophageal reflux disease), Heart murmur, History of hiatal hernia, Hyperlipidemia,  Legally blind in right eye, as defined in [place] , Lumbar herniated disc, Migraines, On home oxygen  therapy (06/15/2022), OSA on CPAP (06/15/2022), PONV (postoperative nausea and vomiting), and Sciatica. Surgical: [name]   has a past surgical history that includes Breast surgery (Bilateral, 2011); Cataract extraction; Bunionectomy; Cleft palate repair; Cardiac surgery; Ablation; transthoracic echocardiogram; Cardiac catheterization; Lumbar laminectomy/decompression microdiscectomy (Left, 05/23/2016); Lumbar laminectomy/decompression microdiscectomy (Left, 05/23/2016); Shoulder injection (Left, 05/23/2016); Reduction mammaplasty (2011); Eye surgery (Right, 2019); Tubal ligation; and Breast lumpectomy with radioactive seed and sentinel lymph node biopsy (Right, 11/23/2020). Family: family history includes Arthritis in her mother; Cancer in her father; Diabetes in her mother; High blood pressure in her mother; Hypertension in her mother.  Laboratory Chemistry Profile   Renal Lab Results  Component Value Date   BUN 18 09/25/2023   CREATININE 1.24 (H) 09/25/2023   LABCREA 123.4 10/27/2021   BCR 10 09/13/2023   GFRAA >60 02/24/2020   GFRNONAA 50 (L) 09/25/2023    Hepatic Lab Results  Component Value Date   AST 54 (H) 09/25/2023   ALT 44 09/25/2023   ALBUMIN  3.9 09/25/2023   ALKPHOS 241 (H) 09/25/2023   LIPASE 35 07/12/2021    Electrolytes Lab Results  Component Value Date   NA 140 09/25/2023   K 4.4 09/25/2023  CL 103 09/25/2023   CALCIUM  9.8 09/25/2023   MG 1.1 (L) 09/13/2023   PHOS 2.6 04/26/2022    Bone Lab Results  Component Value Date   VD25OH 30.51 06/26/2019   VD125OH2TOT 62 05/28/2021   [id number] <8 05/28/2021   [id number] 62 05/28/2021   25OHVITD1 50 11/27/2019   25OHVITD2 46 11/27/2019   25OHVITD3 4.1 11/27/2019    Inflammation (CRP: Acute Phase) (ESR: Chronic Phase) Lab Results  Component Value Date   CRP 5.6 (H) 07/28/2022   ESRSEDRATE 60 (H)  03/23/2023   LATICACIDVEN 1.5 07/23/2022         Note: Above Lab results reviewed.  Recent Imaging Review  US  ABDOMEN LIMITED RUQ (LIVER/GB) CLINICAL DATA:  elevated liver enzymes  EXAM: ULTRASOUND ABDOMEN LIMITED RIGHT UPPER QUADRANT  COMPARISON:  May 19, 2023  FINDINGS: Evaluation is limited secondary to shadowing bowel gas and non NPO status.  Gallbladder:  No gallstones or wall thickening visualized. Previously described cholelithiasis are not visualized on today's exam. No sonographic Murphy sign noted by sonographer.  Common bile duct:  Diameter: Visualized portion measures 5 mm, within normal limits.  Liver:  No focal lesion identified. Upper limits of normal in parenchymal echogenicity. Portal vein is patent on color Doppler imaging with normal direction of blood flow towards the liver.  Other: None.  IMPRESSION: No sonographic etiology for elevated liver enzymes identified.  Electronically Signed   By: Clancy Crimes M.D.   On: 09/25/2023 13:25 Note: Reviewed        Physical Exam  General appearance: Well nourished, well developed, and well hydrated. In no apparent acute distress Mental status: Alert, oriented x 3 (person, place, & time)       Respiratory: No evidence of acute respiratory distress Eyes: PERLA Vitals: BP 121/68   Pulse 92   Temp (!) 97.2 F (36.2 C)   Resp 16   Ht 5\' 7"  (1.702 m)   Wt 198 lb (89.8 kg)   SpO2 100%   BMI 31.01 kg/m  BMI: Estimated body mass index is 31.01 kg/m as calculated from the following:   Height as of this encounter: 5\' 7"  (1.702 m).   Weight as of this encounter: 198 lb (89.8 kg). Ideal: Ideal body weight: 61.6 kg (135 lb 12.9 oz) Adjusted ideal body weight: 72.9 kg (160 lb 10.9 oz)  Assessment   Diagnosis  1. Persistent depressive disorder   2. Lumbar facet syndrome (Multilevel) (Bilateral) (R>L)   3. Spondylosis without myelopathy or radiculopathy, lumbosacral region   4. Chronic pain  syndrome      Updated Problems: No problems updated.  Plan of Care  Problem-specific:  Assessment and Plan    Sciatica   Sciatica is worsening, but she manages with Belbuca , which provides relief. Continue Belbuca  as needed for pain management.  Lower back pain   Lower back pain has improved. She previously found relief with ablation therapy and prefers to monitor the situation without immediate intervention. Monitor back pain and consider repeating ablation therapy if pain returns within six months.  Nausea   Nausea and dizziness have been present for about a week, possibly due to the absence of Xanax , which will be available soon. She plans to reschedule an appointment with her primary care provider to address these symptoms.  Depression   Depression is likely exacerbated by the recent passing of her husband and associated stress. She acknowledges the need for support and is open to psychiatric evaluation. Refer to a psychiatrist, for further  evaluation and support.       [name]  has a current medication list which includes the following long-term medication(s): albuterol , amitriptyline , fluticasone -salmeterol, and omeprazole .  Pharmacotherapy (Medications Ordered): No orders of the defined types were placed in this encounter.  Orders:  Orders Placed This Encounter  Procedures   Ambulatory referral to Psychiatry    Referral Priority:   Routine    Referral Type:   Psychiatric    Referral Reason:   Specialty Services Required    Requested Specialty:   Psychiatry    Number of Visits Requested:   1   Follow-up plan:   Return in about 10 weeks (around 12/14/2023) for Marthe Slain, MM.     Interventional management options:    Lumbar RFA; right L3, L4, L5 06/01/2022, left 06/27/22. B/L L3,4,5 RFA #2 07/05/23 SCS trial; appropriate windows Sprint PNS medial branch Diagnostic left sacroiliac joint block  Diagnostic right IA hip joint injection         Recent Visits Date Type Provider Dept  09/12/23 Office Visit Cephus Collin, MD [facility]  Showing recent visits within past 90 days and meeting all other requirements Today's Visits Date Type Provider Dept  10/05/23 Office Visit Cephus Collin, MD [facility]  Showing today's visits and meeting all other requirements Future Appointments No visits were found meeting these conditions. Showing future appointments within next 90 days and meeting all other requirements  I discussed the assessment and treatment plan with the patient. The patient was provided an opportunity to ask questions and all were answered. The patient agreed with the plan and demonstrated an understanding of the instructions.  Patient advised to call back or seek an in-person evaluation if the symptoms or condition worsens.  Duration of encounter: 20 minutes.  Total time on encounter, as per AMA guidelines included both the face-to-face and non-face-to-face time personally spent by the physician and/or other qualified health care professional(s) on the day of the encounter (includes time in activities that require the physician or other qualified health care professional and does not include time in activities normally performed by clinical staff). Physician's time may include the following activities when performed: Preparing to see the patient (e.g., pre-charting review of records, searching for previously ordered imaging, lab work, and nerve conduction tests) Review of prior analgesic pharmacotherapies. Reviewing PMP Interpreting ordered tests (e.g., lab work, imaging, nerve conduction tests) Performing post-procedure evaluations, including interpretation of diagnostic procedures Obtaining and/or reviewing separately obtained history Performing a medically appropriate examination and/or evaluation Counseling and educating the patient/family/caregiver Ordering medications, tests, or  procedures Referring and communicating with other health care professionals (when not separately reported) Documenting clinical information in the electronic or other health record Independently interpreting results (not separately reported) and communicating results to the patient/ family/caregiver Care coordination (not separately reported)  Note by: Cephus Collin, MD (TTS and AI technology used. I apologize for any typographical errors that were not detected and corrected.) Date: 10/05/2023; Time: 9:21 AM

## 2023-10-05 NOTE — Progress Notes (Signed)
 Nursing Pain Medication Assessment:  Safety precautions to be maintained throughout the outpatient stay will include: orient to surroundings, keep bed in low position, maintain call bell within reach at all times, provide assistance with transfer out of bed and ambulation.  Medication Inspection Compliance: Pill count conducted under aseptic conditions, in front of the patient. Neither the pills nor the bottle was removed from the patient's sight at any time. Once count was completed pills were immediately returned to the patient in their original bottle.  Medication #1: Buprenorphine  (Suboxone) Pill/Patch Count:  41 of 60 pills remain Pill/Patch Appearance: Markings consistent with prescribed medication Bottle Appearance: Standard pharmacy container. Clearly labeled. Filled Date: 4 / 24 / 2025 Last Medication intake:  Today  Medication #2: Hydrocodone /APAP Pill/Patch Count:  85 of 90 pills remain Pill/Patch Appearance: Markings consistent with prescribed medication Bottle Appearance: Standard pharmacy container. Clearly labeled. Filled Date: 4 / 3 / 2025 Last Medication intake:   pt does not remember

## 2023-10-06 ENCOUNTER — Ambulatory Visit: Payer: Self-pay

## 2023-10-06 ENCOUNTER — Ambulatory Visit: Payer: Self-pay | Admitting: *Deleted

## 2023-10-06 NOTE — Progress Notes (Signed)
 Complex Care Management Note  Care Guide Note 10/06/2023 Name: KYANI AGIUS [name]  MRN: [id number] DOB: 29-Mar-1966  Alethia Huxley [name]  is a 58 y.o. year old female who sees Ngetich, Dinah C, NP for primary care. I reached out to Alethia Huxley [name]  by phone today to offer complex care management services.  Ms. Renford Cartwright [name]  was given information about Complex Care Management services today including:   The Complex Care Management services include support from the care team which includes your Nurse Care Manager, Clinical Social Worker, or Pharmacist.  The Complex Care Management team is here to help remove barriers to the health concerns and goals most important to you. Complex Care Management services are voluntary, and the patient may decline or stop services at any time by request to their care team member.   Complex Care Management Consent Status: Patient agreed to services and verbal consent obtained.   Follow up plan:  Telephone appointment with complex care management team member scheduled for:  10/20/23  Encounter Outcome:  Patient Scheduled  Barnie Bora  [facility]  [name], [facility] Guide  Direct Dial: [phone]  Fax [phone]

## 2023-10-06 NOTE — Telephone Encounter (Signed)
 Noted fwd to monina since she will be seeing pt

## 2023-10-06 NOTE — Telephone Encounter (Signed)
 Chief Complaint: diarrhea x 5 days and incontinent of bowel and bladder last night  Symptoms: diarrhea approx 7 or more times. Taking imodium  with minimal relief. Nausea. Tried to eat solid food on Wednesday and had diarrhea again. Is drinking fluids. Last night woke up and was incontinent of B/B bed saturated.  Frequency: 5 days  Pertinent Negatives: Patient denies fever no vomiting. No blood in stool.  Disposition: [] ED /[] Urgent Care (no appt availability in office) / [x] Appointment(In office/virtual)/ []  [facility]/ [] Home Care/ [] Refused Recommended Disposition /[] [facility]/ []  Follow-up with PCP Additional Notes:   Patient reports she can not come in for appt due to incontinence. Requesting VV. Called [facility] to assist with scheduling due to PCP no appt until later next week. Patient scheduled for VV 10/09/23. Recommended if sx worsen dizziness , sx of dehydration noted go to ED.         Copied from CRM [id number]. Topic: Clinical - Red Word Triage >> Oct 06, 2023 11:12 AM Danelle Dunning F wrote: [facility] that prompted transfer to Nurse Triage:   Patient has been experiencing incontinence from both ends and was awaken to her entire bed being saturated; Patient has been nauseous and experiencing loose stool for about 5 days leading up to this event. Reason for Disposition  [1] SEVERE diarrhea (e.g., 7 or more times / day more than normal) AND [2] present > 24 hours (1 day)  Answer Assessment - Initial Assessment Questions 1. DIARRHEA SEVERITY: "How bad is the diarrhea?" "How many more stools have you had in the past 24 hours than normal?"    - NO DIARRHEA (SCALE 0)   - MILD (SCALE 1-3): Few loose or mushy BMs; increase of 1-3 stools over normal daily number of stools; mild increase in ostomy output.   -  MODERATE (SCALE 4-7): Increase of 4-6 stools daily over normal; moderate increase in ostomy output.   -  SEVERE (SCALE 8-10; OR "WORST POSSIBLE"): Increase of 7 or more  stools daily over normal; moderate increase in ostomy output; incontinence.    Moderate  2. ONSET: "When did the diarrhea begin?"      5 days ago  3. BM CONSISTENCY: "How loose or watery is the diarrhea?"      Loose  4. VOMITING: "Are you also vomiting?" If Yes, ask: "How many times in the past 24 hours?"      None since Sunday   5. ABDOMEN PAIN: "Are you having any abdomen pain?" If Yes, ask: "What does it feel like?" (e.g., crampy, dull, intermittent, constant)      No  6. ABDOMEN PAIN SEVERITY: If present, ask: "How bad is the pain?"  (e.g., Scale 1-10; mild, moderate, or severe)   - MILD (1-3): doesn't interfere with normal activities, abdomen soft and not tender to touch    - MODERATE (4-7): interferes with normal activities or awakens from sleep, abdomen tender to touch    - SEVERE (8-10): excruciating pain, doubled over, unable to do any normal activities       No  7. ORAL INTAKE: If vomiting, "Have you been able to drink liquids?" "How much liquids have you had in the past 24 hours?"     Able to eat on Wednesday and drink now  8. HYDRATION: "Any signs of dehydration?" (e.g., dry mouth [not just dry lips], too weak to stand, dizziness, new weight loss) "When did you last urinate?"     na 9. EXPOSURE: "Have you traveled  to a foreign country recently?" "Have you been exposed to anyone with diarrhea?" "Could you have eaten any food that was spoiled?"     Na 10. ANTIBIOTIC USE: "Are you taking antibiotics now or have you taken antibiotics in the past 2 months?"       Na  11. OTHER SYMPTOMS: "Do you have any other symptoms?" (e.g., fever, blood in stool)       Loose stools and incontinent bladder  12. PREGNANCY: "Is there any chance you are pregnant?" "When was your last menstrual period?"       na  Protocols used: [facility]

## 2023-10-06 NOTE — Telephone Encounter (Signed)
 Patient called, left VM to return the call to the office to speak to NT.    Summary: Question if virus is going around   Copied From CRM [id number]. Reason for Triage: loose stools, nausea ( 5 days)

## 2023-10-06 NOTE — Telephone Encounter (Signed)
 Message routed to [place], NP due to PCP Ngetich, Elijio Guadeloupe, NP being out of office.

## 2023-10-08 ENCOUNTER — Encounter: Payer: Self-pay | Admitting: Hematology

## 2023-10-09 ENCOUNTER — Ambulatory Visit: Admitting: Podiatry

## 2023-10-09 ENCOUNTER — Other Ambulatory Visit

## 2023-10-09 ENCOUNTER — Encounter: Payer: Self-pay | Admitting: Adult Health

## 2023-10-09 ENCOUNTER — Telehealth (INDEPENDENT_AMBULATORY_CARE_PROVIDER_SITE_OTHER): Admitting: Adult Health

## 2023-10-09 VITALS — Wt 198.0 lb

## 2023-10-09 DIAGNOSIS — C50919 Malignant neoplasm of unspecified site of unspecified female breast: Secondary | ICD-10-CM

## 2023-10-09 DIAGNOSIS — R197 Diarrhea, unspecified: Secondary | ICD-10-CM | POA: Diagnosis not present

## 2023-10-09 LAB — CBC WITH DIFFERENTIAL/PLATELET
Absolute Lymphocytes: 2595 {cells}/uL (ref 850–3900)
Absolute Monocytes: 251 {cells}/uL (ref 200–950)
Basophils Absolute: 30 {cells}/uL (ref 0–200)
Basophils Relative: 0.8 %
Eosinophils Absolute: 110 {cells}/uL (ref 15–500)
Eosinophils Relative: 2.9 %
HCT: 36.1 % (ref 35.0–45.0)
Hemoglobin: 12.1 g/dL (ref 11.7–15.5)
MCH: 34.4 pg — ABNORMAL HIGH (ref 27.0–33.0)
MCHC: 33.5 g/dL (ref 32.0–36.0)
MCV: 102.6 fL — ABNORMAL HIGH (ref 80.0–100.0)
MPV: 9.6 fL (ref 7.5–12.5)
Monocytes Relative: 6.6 %
Neutro Abs: 813 {cells}/uL — ABNORMAL LOW (ref 1500–7800)
Neutrophils Relative %: 21.4 %
Platelets: 199 10*3/uL (ref 140–400)
RBC: 3.52 10*6/uL — ABNORMAL LOW (ref 3.80–5.10)
RDW: 12.5 % (ref 11.0–15.0)
Total Lymphocyte: 68.3 %
WBC: 3.8 10*3/uL (ref 3.8–10.8)

## 2023-10-09 LAB — BASIC METABOLIC PANEL WITHOUT GFR
BUN/Creatinine Ratio: 9 (calc) (ref 6–22)
BUN: 12 mg/dL (ref 7–25)
CO2: 33 mmol/L — ABNORMAL HIGH (ref 20–32)
Calcium: 9.5 mg/dL (ref 8.6–10.4)
Chloride: 100 mmol/L (ref 98–110)
Creat: 1.33 mg/dL — ABNORMAL HIGH (ref 0.50–1.03)
Glucose, Bld: 86 mg/dL (ref 65–99)
Potassium: 4.2 mmol/L (ref 3.5–5.3)
Sodium: 141 mmol/L (ref 135–146)

## 2023-10-09 NOTE — Progress Notes (Signed)
 Virtual Visit via Video Note  I connected with Brandy [name]  on 10/09/23 at  8:40 AM EDT by a video enabled telemedicine application and verified that I am speaking with the correct person using two identifiers.  Patient Location: Home Provider Location: Office/Clinic+3  I discussed the limitations, risks, security, and privacy concerns of performing an evaluation and management service by video and the availability of in person appointments. I also discussed with the patient that there may be a patient responsible charge related to this service. The patient expressed understanding and agreed to proceed.  Subjective: PCP: Ngetich, Elijio Gu[place], NP  Chief Complaint  Patient presents with   Diarrhea    Started on about 1 week ago , vomiting , she is has going on going diarrhea , she said that she is that she been taking otc medication hasn't help, no fever ,no stomach     Discussed the use of AI scribe software for clinical note transcription with the patient, who gave verbal consent to proceed.  HPI    Brandy [name]  is a 58 year old female with a history of breast cancer who had a video visit due to persistent diarrhea.  She began experiencing gastrointestinal symptoms over a week ago, initially with a 'funny' feeling in her stomach and a single episode of loose stool. There was no vomiting or bowel movements initially, but she later vomited Tylenol  and water after feeling unwell at church.  The symptoms progressed to persistent diarrhea starting last Tuesday, which she initially attributed to Xanax  withdrawal due to running out of her medication. She resumed Xanax  on Thursday, which provided some relief, but diarrhea persisted, worsening to the point of having watery stool in bed by Friday morning. She has been taking Imodium  after each stool, which has slowed the frequency but not resolved the diarrhea completely.  The diarrhea is described as watery with a  'funny smell' and sometimes mucoid. No fever, but she reports sweating initially. She has been able to eat some solid foods, such as fruit with cottage cheese, and has been keeping hydrated with water and ginger ale.  She has a history of breast cancer, which was in the right breast and had spread to her lungs. She is currently cancer-free but continues to take Verzenio , which she notes can cause diarrhea. She stopped taking it when the diarrhea started but resumed it when symptoms persisted. She has been on Verzenio  since 2020 and has experienced occasional diarrhea, but not to this extent.    ROS: Per HPI  Current Outpatient Medications:    acetaminophen  (TYLENOL ) 500 MG tablet, Take 500 mg by mouth daily as needed for moderate pain., Disp: , Rfl:    albuterol  (VENTOLIN  HFA) 108 (90 Base) MCG/ACT inhaler, Inhale 2 puffs into the lungs every 6 (six) hours as needed for wheezing or shortness of breath., Disp: 8 g, Rfl: 6   ALPRAZolam  (XANAX ) 1 MG tablet, TAKE 1 TABLET BY MOUTH AT  BEDTIME AS NEEDED FOR ANXIETY, Disp: 30 tablet, Rfl: 5   amitriptyline  (ELAVIL ) 75 MG tablet, Take 1 tablet (75 mg total) by mouth at bedtime., Disp: 90 tablet, Rfl: 3   Buprenorphine  HCl (BELBUCA ) 300 MCG FILM, Place 300 mcg inside cheek 2 (two) times daily., Disp: 60 each, Rfl: 2   Carboxymethylcellul-Glycerin (LUBRICATING EYE DROPS OP), Place 1 drop into the right eye in the morning, at noon, and at bedtime., Disp: , Rfl:    cyanocobalamin  (VITAMIN B12) 500 MCG  tablet, Take 1 tablet (500 mcg total) by mouth daily., Disp: 30 tablet, Rfl: 0   Evolocumab  (REPATHA  SURECLICK) 140 MG/ML SOAJ, Inject 140 mg into the skin every 14 (fourteen) days., Disp: 6 mL, Rfl: 3   fluticasone -salmeterol (ADVAIR) 250-50 MCG/ACT AEPB, Inhale 1 puff into the lungs in the morning and at bedtime., Disp: 180 each, Rfl: 3   HYDROcodone -acetaminophen  (NORCO/VICODIN) 5-325 MG tablet, Take 1 tablet by mouth 3 (three) times daily as needed for severe  pain (pain score 7-10). Must last 30 days, Disp: 90 tablet, Rfl: 0   ketoconazole (NIZORAL) 2 % cream, Apply 1 Application topically 2 (two) times daily., Disp: , Rfl:    loperamide  (IMODIUM ) 2 MG capsule, Take 1-2 capsules (2-4 mg total) by mouth 4 (four) times daily as needed for diarrhea or loose stools., Disp: 30 capsule, Rfl: 1   magnesium  30 MG tablet, Take 30 mg by mouth 2 (two) times daily., Disp: , Rfl:    methocarbamol  (ROBAXIN ) 500 MG tablet, Take 1 tablet (500 mg total) by mouth every 6 (six) hours as needed for muscle spasms., Disp: 40 tablet, Rfl: 2   midodrine  (PROAMATINE ) 10 MG tablet, TAKE 1 TABLET BY MOUTH IN THE  MORNING AND AT BEDTIME, Disp: 180 tablet, Rfl: 1   Multiple Vitamin (MULTIVITAMIN WITH MINERALS) TABS tablet, Take 1 tablet by mouth daily., Disp: , Rfl:    omeprazole  (PRILOSEC  OTC) 20 MG tablet, Take 20 mg by mouth daily., Disp: , Rfl:    PRESCRIPTION MEDICATION, CPAP- At bedtime, Disp: , Rfl:    triamcinolone  cream (KENALOG ) 0.1 %, Apply 1 Application topically 2 (two) times daily., Disp: 30 g, Rfl: 0   VERZENIO  100 MG tablet, TAKE 1 TABLET BY MOUTH TWICE DAILY, Disp: 56 tablet, Rfl: 0   Vitamin D , Ergocalciferol , (DRISDOL ) 1.25 MG (50000 UNIT) CAPS capsule, TAKE 1 CAPSULE BY MOUTH EVERY 7  DAYS ( MONDAY ), Disp: 5 capsule, Rfl: 9  Observations/Objective: Today's Vitals   10/09/23 0833  Weight: 198 lb (89.8 kg)   Physical Exam  Assessment and Plan:  1. Diarrhea, unspecified type (Primary) -  Acute diarrhea for over a week with watery, odorous stools and mucus. No fever. Possible causes: C. difficile, food poisoning, Xanax  withdrawal. Verzenio -related diarrhea unlikely due to long-term use.  Continue Imodium  PRN -  continue to drink fluids for hydration, eat bland, easy to digest foods such as crackers, bananas, toast etc -  avoid spicy or fatty foos - Order stool culture for C. difficile. - Order blood tests for electrolytes and CBC. - Advise hydration with  water and ginger ale. - Instruct to come to the office for specimen collection and blood draw. - Clostridium difficile culture-fecal - Basic Metabolic Panel with eGFR - CBC with Differential/Platelets  2. 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 Continuing Verzenio  as per oncology recommendation. - Continue Verzenio  as prescribed. - Follow up with oncology as scheduled.       Follow Up Instructions: Return if symptoms worsen or fail to improve.   I discussed the assessment and treatment plan with the patient. The patient was provided an opportunity to ask questions, and all were answered. The patient agreed with the plan and demonstrated an understanding of the instructions.   The patient was advised to call back or seek an in-person evaluation if the symptoms worsen or if the condition fails to improve as anticipated.  The above assessment and management plan  was discussed with the patient. The patient verbalized understanding of and has agreed to the management plan.   Shawnda Mauney Medina-Vargas, NP

## 2023-10-09 NOTE — Patient Instructions (Signed)
 Diarrhea, Adult Diarrhea is when you pass loose and sometimes watery poop (stool) often. Diarrhea can make you feel weak and cause you to lose water in your body (get dehydrated). Losing water in your body can cause you to: Feel tired and thirsty. Have a dry mouth. Go pee (urinate) less often. Diarrhea often lasts 2-3 days. It can last longer if it is a sign of something more serious. Be sure to treat your diarrhea as told by your doctor. Follow these instructions at home: Eating and drinking     Follow these instructions as told by your doctor: Take an ORS (oral rehydration solution). This is a drink that helps you replace fluids and minerals your body lost. It is sold at pharmacies and stores. Drink enough fluid to keep your pee (urine) pale yellow. Drink fluids such as: Water. You can also get fluids by sucking on ice chips. Diluted fruit juice. Low-calorie sports drinks. Milk. Avoid drinking fluids that have a lot of sugar or caffeine in them. These include soda, energy drinks, and regular sports drinks. Avoid alcohol. Eat bland, easy-to-digest foods in small amounts as you are able. These foods include: Bananas. Applesauce. Rice. Low-fat (lean) meats. Toast. Crackers. Avoid spicy or fatty foods.  Medicines Take over-the-counter and prescription medicines only as told by your doctor. If you were prescribed antibiotics, take them as told by your doctor. Do not stop taking them even if you start to feel better. General instructions  Wash your hands often using soap and water for 20 seconds. If soap and water are not available, use hand sanitizer. Others in your home should wash their hands as well. Wash your hands: After using the toilet or changing a diaper. Before preparing, cooking, or serving food. While caring for a sick person. While visiting someone in a hospital. Rest at home while you get better. Take a warm bath to help with any burning or pain from having  diarrhea. Watch your condition for any changes. Contact a doctor if: You have a fever. Your diarrhea gets worse. You have new symptoms. You vomit every time you eat or drink. You feel light-headed, dizzy, or you have a headache. You have muscle cramps. You have signs of losing too much water in your body, such as: Dark pee, very little pee, or no pee. Cracked lips. Dry mouth. Sunken eyes. Sleepiness. Weakness. You have bloody or black poop or poop that looks like tar. You have very bad pain, cramping, or bloating in your belly (abdomen). Your skin feels cold and clammy. You feel confused. Get help right away if: You have chest pain. Your heart is beating very quickly. You have trouble breathing or you are breathing very quickly. You feel very weak or you faint. These symptoms may be an emergency. Get help right away. Call 911. Do not wait to see if the symptoms will go away. Do not drive yourself to the hospital. This information is not intended to replace advice given to you by your health care provider. Make sure you discuss any questions you have with your health care provider. Document Revised: 11/09/2021 Document Reviewed: 11/09/2021 Elsevier Patient Education  2024 [employer].

## 2023-10-09 NOTE — Telephone Encounter (Signed)
 Patient was seen today, 10/09/23. FYI.

## 2023-10-10 ENCOUNTER — Ambulatory Visit: Admitting: Rehabilitation

## 2023-10-11 ENCOUNTER — Encounter (HOSPITAL_COMMUNITY): Payer: Self-pay

## 2023-10-11 DIAGNOSIS — R197 Diarrhea, unspecified: Secondary | ICD-10-CM | POA: Diagnosis not present

## 2023-10-11 NOTE — Progress Notes (Signed)
-   electrolytes normal -  no anemia

## 2023-10-16 LAB — CLOSTRIDIUM DIFFICILE CULTURE-FECAL

## 2023-10-17 ENCOUNTER — Ambulatory Visit: Payer: Self-pay | Attending: Family | Admitting: Physical Therapy

## 2023-10-17 ENCOUNTER — Encounter: Payer: Self-pay | Admitting: Hematology

## 2023-10-17 ENCOUNTER — Ambulatory Visit: Payer: Self-pay | Admitting: Adult Health

## 2023-10-17 ENCOUNTER — Encounter: Payer: Self-pay | Admitting: Physical Therapy

## 2023-10-17 ENCOUNTER — Ambulatory Visit: Payer: Self-pay | Admitting: Rehabilitation

## 2023-10-17 DIAGNOSIS — M6281 Muscle weakness (generalized): Secondary | ICD-10-CM | POA: Insufficient documentation

## 2023-10-17 DIAGNOSIS — R2689 Other abnormalities of gait and mobility: Secondary | ICD-10-CM | POA: Diagnosis not present

## 2023-10-17 DIAGNOSIS — R2681 Unsteadiness on feet: Secondary | ICD-10-CM | POA: Insufficient documentation

## 2023-10-17 NOTE — Progress Notes (Signed)
-   Electrolytes normal -   No anemia -   C. difficile culture negative

## 2023-10-17 NOTE — Therapy (Signed)
 OUTPATIENT PHYSICAL THERAPY NEURO NOTE/RECERT   Patient Name: Brandy [name] [name]  MRN: [id number] DOB:13-May-1966, 58 y.o., female Today's Date: 10/18/2023   PCP: Estil Heman, NP  REFERRING PROVIDER: Estil Heman, NP     END OF SESSION:  PT End of Session - 10/17/23 0935     Visit Number 13    Number of Visits 19    Date for PT Re-Evaluation 12/01/23   per recert 10/17/2023   Authorization Type [employer] Medicare-submitted for reauth after 10/17/2023 visit    Authorization Time Period approved 4 visits from 4/15-5/13/25   new auth submitted   Authorization - Visit Number 3    Authorization - Number of Visits 4    Progress Note Due on Visit --    PT Start Time 0935    PT Stop Time 1015    PT Time Calculation (min) 40 min    Activity Tolerance Patient tolerated treatment well    Behavior During Therapy [facility] for tasks assessed/performed                      Past Medical History:  Diagnosis Date   Acute pansinusitis 08/02/2017   Anxiety    Arthritis    ASD (atrial septal defect)    s/p closure with Amplatzer device 10/05/04 (Dr. Kyla Phlegm, [facility]) 10/05/04   Cancer [facility])    Cataract    Chronic pain    CKD (chronic kidney disease)    Dyspnea    Fatty liver 09/04/2019   GERD (gastroesophageal reflux disease)    Heart murmur    no longer heard   History of hiatal hernia    Hyperlipidemia    Legally blind in right eye, as defined in [place]     Lumbar herniated disc    Migraines    On home oxygen  therapy 06/15/2022   Pt was given O2 on D/C from [facility] 04/2022   OSA on CPAP 06/15/2022   PONV (postoperative nausea and vomiting)    Sciatica    Past Surgical History:  Procedure Laterality Date   ABLATION     BREAST LUMPECTOMY WITH RADIOACTIVE SEED AND SENTINEL LYMPH NODE BIOPSY Right 11/23/2020   Procedure: RIGHT BREAST LUMPECTOMY WITH RADIOACTIVE SEED AND RIGHT AXILLARY SENTINEL LYMPH NODE BIOPSY;  Surgeon: Enid Harry, MD;  Location: [facility] OR;   Service: General;  Laterality: Right;   BREAST SURGERY Bilateral 2011   Breast Reduction Surgery   BUNIONECTOMY     CARDIAC CATHETERIZATION     10/05/04 [facility]): LM < 25%, otherwise normal coronaries. No pulmonary HTN, Mildly enlarged RV. Secundum ASD s/p closure.   CARDIAC SURGERY     CATARACT EXTRACTION     CLEFT PALATE REPAIR     s/p cleft lip and palate repair   EYE SURGERY Right 2019   right eye removed   LUMBAR LAMINECTOMY/DECOMPRESSION MICRODISCECTOMY Left 05/23/2016   Procedure: LEFT L4-L5 LATERAL RECESS DECOMPRESSION WITH CENTRAL AND RIGHT DECOMPRESSION VIA LEFT SIDE;  Surgeon: Alphonso Jean, MD;  Location: [facility] OR;  Service: Orthopedics;  Laterality: Left;   LUMBAR LAMINECTOMY/DECOMPRESSION MICRODISCECTOMY Left 05/23/2016   Procedure: LUMBAR LAMINECTOMY/DECOMPRESSION MICRODISCECTOMY Lumbar five - Sacral One 1 LEVEL;  Surgeon: Alphonso Jean, MD;  Location: [facility] OR;  Service: Orthopedics;  Laterality: Left;   REDUCTION MAMMAPLASTY  2011   SHOULDER INJECTION Left 05/23/2016   Procedure: SHOULDER INJECTION;  Surgeon: Alphonso Jean, MD;  Location: [facility] OR;  Service: Orthopedics;  Laterality: Left;  band-aid per  pa-c   TRANSTHORACIC ECHOCARDIOGRAM     12/15/05 [facility]): Mild LVH, EF > 55%, grade 1 diastolic dysfunction, Trivial MR/PR/TR.   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 (HCC) 09/13/2023   Chronic obstructive pulmonary disease, unspecified COPD type (HCC) 03/15/2023   Major depressive disorder, single episode, severe without psychotic features (HCC) 03/15/2023   Orthostatic hypotension 04/24/2022   Hyperbilirubinemia 04/24/2022   Snoring 07/14/2021   Fatigue 07/14/2021   Daytime somnolence 07/14/2021   Status post device closure of ASD 06/03/2021   Secundum ASD 06/03/2021   Decreased diffusion capacity  05/14/2021   Dyspnea 03/04/2021   Pulmonary nodules 04/01/2020   Primary malignant neoplasm of breast with metastasis (HCC) 02/24/2020   Spondylosis without myelopathy or radiculopathy, lumbosacral region 12/31/2019   Chronic low back pain (Bilateral) w/o sciatica 12/31/2019   Chronic hip pain (3ry area of Pain) (Bilateral) (R>L) 11/27/2019   Chronic sacroiliac joint pain (Left) 11/27/2019   Chronic pain syndrome 11/11/2019   Encounter for chronic pain management 11/11/2019   Disorder of skeletal system 11/11/2019   Problems influencing health status 11/11/2019   Abnormal MRI, lumbar spine (08/22/2018) 11/11/2019   Chronic low back pain (1ry area of Pain) (Bilateral) w/ sciatica (Bilateral) 11/11/2019   DDD (degenerative disc disease), lumbosacral 11/11/2019   Grade 1 Anterolisthesis (L2-3, L3-4) 11/11/2019   Grade 1 Retrolisthesis (L4-5, L5-S1) 11/11/2019   Lumbar facet hypertrophy (Multilevel) (Bilateral) 11/11/2019   Lumbar facet syndrome (Multilevel) (Bilateral) (R>L) 11/11/2019   Chronic lower extremity pain (2ry area of Pain) (Bilateral) (R>L) 11/11/2019   Neurogenic pain 11/11/2019   Chronic musculoskeletal pain 11/11/2019   Infiltrating ductal carcinoma of breast (HCC) 10/08/2019   Fatty liver 09/04/2019   Obesity (BMI 30-39.9) 09/04/2019   GERD (gastroesophageal reflux disease)    Hyperlipidemia    CKD (chronic kidney disease), stage III (HCC)    Depression    Malignant hypertension    Ocular proptosis 02/14/2019   PVD (posterior vitreous detachment), left eye 02/14/2019   Chorioretinal scar of left eye 02/14/2019   Vitreous floaters of left eye 02/14/2019   Bilateral leg weakness 01/24/2019   Insomnia 08/02/2017   Low back pain radiating to both legs 08/02/2017   Migraine 08/02/2017   Neuropathy 08/02/2017   Spinal stenosis, 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    Class: Chronic   Left shoulder tendonitis 05/23/2016    Class: Acute   Post  laminectomy syndrome 05/23/2016   Anxiety disorder 01/07/2015    ONSET DATE: 07/20/2023 (MD referral)  REFERRING DIAG:  M54.16 (ICD-10-CM) - Lumbar back pain with radiculopathy affecting left lower extremity  R26.81 (ICD-10-CM) - Unsteady gait  R29.6 (ICD-10-CM) - Frequent falls    THERAPY DIAG:  Unsteadiness on feet  Muscle weakness (generalized)  Other abnormalities of gait and mobility  Rationale for Evaluation and Treatment: Rehabilitation  SUBJECTIVE:  SUBJECTIVE STATEMENT: Think I had a GI bug, that's why I missed.  I do feel better.   My balance is still way off and I don't want to fall.   Pt accompanied by: self  PERTINENT HISTORY: Per MD:  She has been experiencing worsening back pain following a fall last month during a snowstorm. She slipped on ice and fell, initially without pain, but began experiencing consistent pain about a week later. The pain is primarily located in the lower back, radiating more to the left side, with tenderness in the buttocks and back of the thigh. On February 9th, she experienced severe pain radiating from her left foot up her leg, causing her to fall back into bed. No current pain radiating down the leg, weakness, tingling or numbness. No swelling or bruising in the affected areas. She has a history of back pain and underwent a nerve ablation procedure at the end of January to manage her symptoms.  PMH:  breast cancer with metastasis;  Lumbar laminectomy/decompression microdiscectomy (Left, 05/23/2016); Lumbar laminectomy/decompression microdiscectomy (Left, 05/23/2016); Shoulder injection (Left, 05/23/2016); Reduction mammaplasty (2011); Eye surgery (Right, 2019); Tubal ligation; and Breast lumpectomy with radioactive seed and sentinel lymph node biopsy (Right,  11/23/2020).   PAIN: 0/10  PRECAUTIONS: Fall; decreased vision Per MD note:  She has issues with her eyes, specifically dry eyes, requiring the use of artificial tears every hour. She has a prosthetic eye (R) and uses lubricant and tapes her left eyelid shut at night to prevent dryness. Her vision can be distorted at times, affecting her daily activities.   RED FLAGS: None   WEIGHT BEARING RESTRICTIONS: No  FALLS: Has patient fallen in last 6 months? Yes. Number of falls at least 3 falls  LIVING ENVIRONMENT: Lives with: lives with their spouse Lives in: House/apartment Stairs: 6 steps, multiple steps -getting ready to move into apartment where there are no steps Has following equipment at home: Single point cane and Walker - 4 wheeled  PLOF: Independent with household mobility with device and Independent with community mobility with device  PATIENT GOALS: Pt's goals for therapy become painfree, more independent, exercise more; 10/17/2023:  better balance  OBJECTIVE:   Discussed POC and PT:  Pt feels she is using her assistive devices more effectively, has arranged her work space at home to be more at eye level/waist height to help with back pain.  Balance is better, but still not fully stable.  No falls.  Pain has really decreased.  Haven't yet called the [employer] to get in with classes or the pool.  Uses cane at home or at church.  TODAY'S TREATMENT: 10/17/2023 Activity Comments  TUG:  39.78 sec with rollator   FTSTS: 20.18 sec   Gait velocity: Rollator:  27.63 sec (1.19 ft/sec)   Romberg EO Romberg EC Mild sway on solid surface; moderate sway on foam surface  Modified Oswestry Score 20/50          PATIENT EDUCATION: Education details: POC, Progress towards goals; fall risk per balance measures, plans for recert/continued PT. Person educated: Patient Education method: Explanation Education comprehension: verbalized understanding    HOME EXERCISE PROGRAM Access Code:  [email or web address] URL: [email or web address] Date: 08/29/2023 Prepared by: [facility] - Outpatient  Rehab - [facility]  Exercises - Supine Posterior Pelvic Tilt  - 1 x daily - 7 x weekly - 1-2 sets - 10 reps - Supine Hamstring Stretch with Strap  - 1 x daily - 5 x weekly - 2 sets - 30  sec hold - Supine Lower Trunk Rotation  - 1 x daily - 5 x weekly - 2 sets - 20 reps - Seated Anterior Pelvic Tilt  - 1 x daily - 7 x weekly - 1-2 sets - 10 reps - Wide Tandem Stance with Eyes Open  - 1 x daily - 5 x weekly - 2 sets - 30 sec hold - Standing Toe Taps  - 1 x daily - 5 x weekly - 2 sets - 10 reps - Standing Shoulder Row with Anchored Resistance  - 1 x daily - 5 x weekly - 2 sets - 10 reps - Standing Anti-Rotation Press with Anchored Resistance  - 1 x daily - 7 x weekly - 3 sets - 10 reps - 2 sec hold - Hooklying Isometric Hip Flexion with Opposite Arm  - 1 x daily - 7 x weekly - 3 sets - 10 reps - 2 sec hold     ---------------------------------------------  Note: Objective measures were completed at Evaluation unless otherwise noted.  DIAGNOSTIC FINDINGS: x-rays to be completed 07/26/2023  COGNITION: Overall cognitive status: Within functional limits for tasks assessed   SENSATION: No reports of numbness   POSTURE: rounded shoulders, forward head, and posterior pelvic tilt  LOWER EXTREMITY ROM:     Active  Right Eval Left Eval  Hip flexion    Hip extension    Hip abduction    Hip adduction    Hip internal rotation    Hip external rotation    Knee flexion    Knee extension    Ankle dorsiflexion 5 5  Ankle plantarflexion    Ankle inversion    Ankle eversion     (Blank rows = not tested)  LOWER EXTREMITY MMT:    MMT Right Eval Left Eval  Hip flexion 4+ 4+  Hip extension    Hip abduction 4 4  Hip adduction 4 4  Hip internal rotation    Hip external rotation    Knee flexion 4 4  Knee extension 4+ 4+  Ankle dorsiflexion 4 4  Ankle plantarflexion     Ankle inversion    Ankle eversion    (Blank rows = not tested)  LUMBAR: Palpation:  Tender to palpation along paraspinals thoracic spine, SI joint area, bilateral buttocks/gluts/piriformis; pt tender to palpation along IT band R>L Baseline pain in standing:  0/10 Repeated standing flexion: 3/10 pain, into bilat buttocks Repeated standing extension:1-2/10 pain centralized at mid- low back Limited in lumbar extension flexibility  Hamstring flexibility from supine 90/90:  R  -15 degrees; L -20 degrees SLR test from supine:  R 85 degrees, L 80 degrees  TRANSFERS: Assistive device utilized: None BUE support Sit to stand: Modified independence Stand to sit: Modified independence   GAIT: Gait pattern: step through pattern, antalgic, lateral lean- Right, and lateral lean- Left Distance walked: 40-50 ft Assistive device utilized: Single point cane Level of assistance: Modified independence Comments: Has rollator at home, reports less pain with gait when using rollator   PATIENT SURVEYS:  Modified Oswestry (Back Index):  48  GOALS: Goals reviewed with patient? Yes  SHORT TERM GOALS: Target date: 08/25/2023  Pt will be independent with HEP for improved strength, flexibility, balance, gait. Baseline: Pt denies questions on HEP 08/23/23 Goal status: MET  08/23/23  2.  Pt will report low back pain reduced by 50% during functional daily activities. Baseline:  Reports LBP is "touch and go." Reports that is it a "teeny weeny bit better" 08/24/23  Goal status: IN PROGRESS 08/24/23   3. Patient to score at least 46/56 on Berg in order to decrease risk of falls.  Baseline: 42 08/24/23; 52/56 09/19/23 Goal status:MET 09/19/23  4.  Pt will verbalize understanding of fall prevention and posture/body mechanics for decreased pain/decreased fall risk.  Baseline: provided  today 08/23/23  Goal status: MET 08/23/23   LONG TERM GOALS: UPDATED TARGET 12/01/2023  Pt will be independent with progression of HEP (*including aquatic HEP) for improved strength, balance, gait. Baseline: interested in aquatic HEP for use of [employer] pool Goal status: IN PROGRESS 10/17/2023  2. Pt will improve 5x sit<>stand to less than or equal to 15 sec to demonstrate improved functional strength and transfer efficiency.  Baseline:  20 sec  Goal status:  INITIAL, 10/17/2023  3.  Pt will improve TUG score to less than or equal to 25 sec for decreased fall risk.. Baseline: 39 sec Goal status: INITIAL 10/17/23  4.  Pt will verbalize plans for continued community fitness upon d/c from PT to maximize gains in PT. Baseline:  educated on this today, pt reports that she has a [employer] 09/19/23 Goal status: IN PROGRESS 10/17/23  5.  Pt will improve gait velocity to at least 1.8 ft/sec for improved gait efficiency and safety.  Baseline:  1.19 ft/sec  Goal status:  INITIAL, 10/17/2023      ASSESSMENT:  CLINICAL IMPRESSION: Pt presents today with no new complaints; she reports feeling better from likely GI bug, for which she had to cancel some PT/aquatic appointments.  Overall, she feels and reports her back pain is better; she is still significantly concerned with her balance.  Given her back pain improvement, looked at objective measures with balance today.  Pt is at fall risk per FTSTS score of 20 sec, TUG score of 39 sec, and gait velocity score of 1.19 ft/sec. She would benefit from further skilled PT to address balance and functional strength, for improved functional mobility and decreased fall risk.  OBJECTIVE IMPAIRMENTS: Abnormal gait, decreased balance, decreased mobility, difficulty walking, decreased ROM, decreased strength, impaired flexibility, postural dysfunction, and pain.   ACTIVITY LIMITATIONS: lifting, sitting, standing, stairs, transfers, and locomotion  level  PARTICIPATION LIMITATIONS: community activity and exercise  PERSONAL FACTORS: 3+ comorbidities: see above are also affecting patient's functional outcome.   REHAB POTENTIAL: Good  CLINICAL DECISION MAKING: Evolving/moderate complexity  EVALUATION COMPLEXITY: Moderate  PLAN:  PT FREQUENCY: 1x/week  PT DURATION: 6 weeks per recert 10/17/2023   PLANNED INTERVENTIONS: 97110-Therapeutic exercises, 97530- Therapeutic activity, 97112- Neuromuscular re-education, 97535- Self Care, 16109- Manual therapy, [id number]- Gait training, [phone]- Aquatic Therapy, [phone]- Electrical stimulation (unattended), 97035- Ultrasound, Patient/Family education, Balance training, DME instructions, and Moist heat  PLAN FOR NEXT SESSION:  Continue balance, strengthening, ankle flexibility exercises (consider adding to HEP).  AQUATICS: Frequency: 1x/wk aquatics and 1x/wk in clinic (alternating weeks) Duration: 1-2 weeks *10/18/2023*Special Instruction: flexibility, strength, balance work; Physiological scientist up aquatic HEP that patient can use in pool at [employer] upon d/c.   Dessie Flow, PT 10/18/23 9:10 AM Phone: [phone] Fax: [phone]  [facility] Re[facility] at [facility] [address] [place], [address] [place], [place] [address] Phone # [phone] Fax # [phone]

## 2023-10-18 ENCOUNTER — Inpatient Hospital Stay: Payer: Self-pay

## 2023-10-18 ENCOUNTER — Ambulatory Visit: Admitting: Hematology

## 2023-10-18 ENCOUNTER — Encounter: Payer: Self-pay | Admitting: Family

## 2023-10-18 ENCOUNTER — Inpatient Hospital Stay: Payer: Medicare Other

## 2023-10-18 ENCOUNTER — Ambulatory Visit: Admitting: Family

## 2023-10-18 VITALS — BP 122/78 | HR 83 | Temp 97.6°F | Ht 67.0 in | Wt 207.0 lb

## 2023-10-18 DIAGNOSIS — Z0000 Encounter for general adult medical examination without abnormal findings: Secondary | ICD-10-CM | POA: Diagnosis not present

## 2023-10-18 NOTE — Patient Instructions (Signed)
 [name]  , Thank you for taking time to come for your Medicare Wellness Visit. I appreciate your ongoing commitment to your health goals. Please review the following plan we discussed and let me know if I can assist you in the future.   Screening recommendations/referrals: Colonoscopy : Up to date  Mammogram : Up to date  Bone Density : Up to date  Recommended yearly ophthalmology/optometry visit for glaucoma screening and checkup Recommended yearly dental visit for hygiene and checkup  Vaccinations: Influenza vaccine- due annually in September/October Pneumococcal vaccine : Up to date  Tdap vaccine : Up to date  Shingles vaccine : Up to date     Advanced directives: No   Conditions/risks identified: advanced age (>39men, >49 women);obesity (BMI >30kg/m2);smoking/ tobacco exposure;sedentary lifestyle  Next appointment: 1 year    Preventive Care 59 Years and Older, Female Preventive care refers to lifestyle choices and visits with your health care provider that can promote health and wellness. What does preventive care include? A yearly physical exam. This is also called an annual well check. Dental exams once or twice a year. Routine eye exams. Ask your health care provider how often you should have your eyes checked. Personal lifestyle choices, including: Daily care of your teeth and gums. Regular physical activity. Eating a healthy diet. Avoiding tobacco and drug use. Limiting alcohol  use. Practicing safe sex. Taking low-dose aspirin  every day. Taking vitamin and mineral supplements as recommended by your health care provider. What happens during an annual well check? The services and screenings done by your health care provider during your annual well check will depend on your age, overall health, lifestyle risk factors, and family history of disease. Counseling  Your health care provider may ask you questions about your: Alcohol  use. Tobacco use. Drug  use. Emotional well-being. Home and relationship well-being. Sexual activity. Eating habits. History of falls. Memory and ability to understand (cognition). Work and work Astronomer. Reproductive health. Screening  You may have the following tests or measurements: Height, weight, and BMI. Blood pressure. Lipid and cholesterol levels. These may be checked every 5 years, or more frequently if you are over 64 years old. Skin check. Lung cancer screening. You may have this screening every year starting at age 59 if you have a 30-pack-year history of smoking and currently smoke or have quit within the past 15 years. Fecal occult blood test (FOBT) of the stool. You may have this test every year starting at age 59. Flexible sigmoidoscopy or colonoscopy. You may have a sigmoidoscopy every 5 years or a colonoscopy every 10 years starting at age 59. Hepatitis C blood test. Hepatitis B blood test. Sexually transmitted disease (STD) testing. Diabetes screening. This is done by checking your blood sugar (glucose) after you have not eaten for a while (fasting). You may have this done every 1-3 years. Bone density scan. This is done to screen for osteoporosis. You may have this done starting at age 59. Mammogram. This may be done every 1-2 years. Talk to your health care provider about how often you should have regular mammograms. Talk with your health care provider about your test results, treatment options, and if necessary, the need for more tests. Vaccines  Your health care provider may recommend certain vaccines, such as: Influenza vaccine. This is recommended every year. Tetanus, diphtheria, and acellular pertussis (Tdap, Td) vaccine. You may need a Td booster every 10 years. Zoster vaccine. You may need this after age 59. Pneumococcal 13-valent conjugate (PCV13) vaccine. One  dose is recommended after age 59. Pneumococcal polysaccharide (PPSV23) vaccine. One dose is recommended after age  59. Talk to your health care provider about which screenings and vaccines you need and how often you need them. This information is not intended to replace advice given to you by your health care provider. Make sure you discuss any questions you have with your health care provider. Document Released: 06/19/2015 Document Revised: 02/10/2016 Document Reviewed: 03/24/2015 Elsevier Interactive Patient Education  2017 [employer].  Fall Prevention in the Home Falls can cause injuries. They can happen to people of all ages. There are many things you can do to make your home safe and to help prevent falls. What can I do on the outside of my home? Regularly fix the edges of walkways and driveways and fix any cracks. Remove anything that might make you trip as you walk through a door, such as a raised step or threshold. Trim any bushes or trees on the path to your home. Use bright outdoor lighting. Clear any walking paths of anything that might make someone trip, such as rocks or tools. Regularly check to see if handrails are loose or broken. Make sure that both sides of any steps have handrails. Any raised decks and porches should have guardrails on the edges. Have any leaves, snow, or ice cleared regularly. Use sand or salt on walking paths during winter. Clean up any spills in your garage right away. This includes oil or grease spills. What can I do in the bathroom? Use night lights. Install grab bars by the toilet and in the tub and shower. Do not use towel bars as grab bars. Use non-skid mats or decals in the tub or shower. If you need to sit down in the shower, use a plastic, non-slip stool. Keep the floor dry. Clean up any water that spills on the floor as soon as it happens. Remove soap buildup in the tub or shower regularly. Attach bath mats securely with double-sided non-slip rug tape. Do not have throw rugs and other things on the floor that can make you trip. What can I do in the  bedroom? Use night lights. Make sure that you have a light by your bed that is easy to reach. Do not use any sheets or blankets that are too big for your bed. They should not hang down onto the floor. Have a firm chair that has side arms. You can use this for support while you get dressed. Do not have throw rugs and other things on the floor that can make you trip. What can I do in the kitchen? Clean up any spills right away. Avoid walking on wet floors. Keep items that you use a lot in easy-to-reach places. If you need to reach something above you, use a strong step stool that has a grab bar. Keep electrical cords out of the way. Do not use floor polish or wax that makes floors slippery. If you must use wax, use non-skid floor wax. Do not have throw rugs and other things on the floor that can make you trip. What can I do with my stairs? Do not leave any items on the stairs. Make sure that there are handrails on both sides of the stairs and use them. Fix handrails that are broken or loose. Make sure that handrails are as long as the stairways. Check any carpeting to make sure that it is firmly attached to the stairs. Fix any carpet that is loose or worn.  Avoid having throw rugs at the top or bottom of the stairs. If you do have throw rugs, attach them to the floor with carpet tape. Make sure that you have a light switch at the top of the stairs and the bottom of the stairs. If you do not have them, ask someone to add them for you. What else can I do to help prevent falls? Wear shoes that: Do not have high heels. Have rubber bottoms. Are comfortable and fit you well. Are closed at the toe. Do not wear sandals. If you use a stepladder: Make sure that it is fully opened. Do not climb a closed stepladder. Make sure that both sides of the stepladder are locked into place. Ask someone to hold it for you, if possible. Clearly mark and make sure that you can see: Any grab bars or  handrails. First and last steps. Where the edge of each step is. Use tools that help you move around (mobility aids) if they are needed. These include: Canes. Walkers. Scooters. Crutches. Turn on the lights when you go into a dark area. Replace any light bulbs as soon as they burn out. Set up your furniture so you have a clear path. Avoid moving your furniture around. If any of your floors are uneven, fix them. If there are any pets around you, be aware of where they are. Review your medicines with your doctor. Some medicines can make you feel dizzy. This can increase your chance of falling. Ask your doctor what other things that you can do to help prevent falls. This information is not intended to replace advice given to you by your health care provider. Make sure you discuss any questions you have with your health care provider. Document Released: 03/19/2009 Document Revised: 10/29/2015 Document Reviewed: 06/27/2014 Elsevier Interactive Patient Education  2017 [employer].

## 2023-10-18 NOTE — Progress Notes (Signed)
 Subjective:   Brandy [name]  is a 58 y.o. female who presents for Medicare Annual (Subsequent) preventive examination.  Visit Complete: In person  Patient Medicare AWV questionnaire was completed by the patient on 10/18/2023; I have confirmed that all information answered by patient is correct and no changes since this date.  Cardiac Risk Factors include: advanced age (>12men, >82 women);obesity (BMI >30kg/m2);smoking/ tobacco exposure;sedentary lifestyle     Objective:    Today's Vitals   10/18/23 1020 10/18/23 1113  BP: 122/78   Pulse: 83   Temp: 97.6 F (36.4 C)   TempSrc: Oral   SpO2: 99%   Weight: 207 lb (93.9 kg)   Height: 5\' 7"  (1.702 m)   PainSc:  5    Body mass index is 32.42 kg/m.     10/18/2023   10:27 AM 10/05/2023    8:42 AM 09/20/2023    8:49 AM 09/13/2023    9:26 AM 09/12/2023    8:21 AM 07/26/2023    8:08 AM 07/20/2023   10:39 AM  Advanced Directives  Does Patient Have a Medical Advance Directive? Yes No No No No Yes;No No  Type of Estate agent of [place];Living will        Does patient want to make changes to medical advance directive?      Yes (MAU/Ambulatory/Procedural Areas - Information given)   Copy of Healthcare Power of Attorney in Chart? No - copy requested        Would patient like information on creating a medical advance directive? No - Patient declined  No - Patient declined No - Patient declined   No - Patient declined    Current Medications (verified) Outpatient Encounter Medications as of 10/18/2023  Medication Sig   acetaminophen  (TYLENOL ) 500 MG tablet Take 500 mg by mouth daily as needed for moderate pain.   albuterol  (VENTOLIN  HFA) 108 (90 Base) MCG/ACT inhaler Inhale 2 puffs into the lungs every 6 (six) hours as needed for wheezing or shortness of breath.   ALPRAZolam  (XANAX ) 1 MG tablet TAKE 1 TABLET BY MOUTH AT  BEDTIME AS NEEDED FOR ANXIETY   amitriptyline  (ELAVIL ) 75 MG tablet Take 1 tablet (75 mg  total) by mouth at bedtime.   Buprenorphine  HCl (BELBUCA ) 300 MCG FILM Place 300 mcg inside cheek 2 (two) times daily.   Carboxymethylcellul-Glycerin (LUBRICATING EYE DROPS OP) Place 1 drop into the right eye in the morning, at noon, and at bedtime.   cyanocobalamin  (VITAMIN B12) 500 MCG tablet Take 1 tablet (500 mcg total) by mouth daily.   Evolocumab  (REPATHA  SURECLICK) 140 MG/ML SOAJ Inject 140 mg into the skin every 14 (fourteen) days.   fluticasone -salmeterol (ADVAIR) 250-50 MCG/ACT AEPB Inhale 1 puff into the lungs in the morning and at bedtime.   ketoconazole (NIZORAL) 2 % cream Apply 1 Application topically 2 (two) times daily.   loperamide  (IMODIUM ) 2 MG capsule Take 1-2 capsules (2-4 mg total) by mouth 4 (four) times daily as needed for diarrhea or loose stools.   magnesium  30 MG tablet Take 30 mg by mouth 2 (two) times daily.   methocarbamol  (ROBAXIN ) 500 MG tablet Take 1 tablet (500 mg total) by mouth every 6 (six) hours as needed for muscle spasms.   midodrine  (PROAMATINE ) 10 MG tablet TAKE 1 TABLET BY MOUTH IN THE  MORNING AND AT BEDTIME   Multiple Vitamin (MULTIVITAMIN WITH MINERALS) TABS tablet Take 1 tablet by mouth daily.   omeprazole  (PRILOSEC  OTC) 20 MG tablet Take  20 mg by mouth daily.   PRESCRIPTION MEDICATION CPAP- At bedtime   triamcinolone  cream (KENALOG ) 0.1 % Apply 1 Application topically 2 (two) times daily.   VERZENIO  100 MG tablet TAKE 1 TABLET BY MOUTH TWICE DAILY   Vitamin D , Ergocalciferol , (DRISDOL ) 1.25 MG (50000 UNIT) CAPS capsule TAKE 1 CAPSULE BY MOUTH EVERY 7  DAYS ( MONDAY )   No facility-administered encounter medications on file as of 10/18/2023.    Allergies (verified) Zithromax [azithromycin], Tramadol , and Psyllium   History: Past Medical History:  Diagnosis Date   Acute pansinusitis 08/02/2017   Anxiety    Arthritis    ASD (atrial septal defect)    s/p closure with Amplatzer device 10/05/04 (Dr. Kyla Phlegm, [facility]) 10/05/04   Cancer  [facility])    Cataract    Chronic pain    CKD (chronic kidney disease)    Dyspnea    Fatty liver 09/04/2019   GERD (gastroesophageal reflux disease)    Heart murmur    no longer heard   History of hiatal hernia    Hyperlipidemia    Legally blind in right eye, as defined in [place]     Lumbar herniated disc    Migraines    On home oxygen  therapy 06/15/2022   Pt was given O2 on D/C from [facility] 04/2022   OSA on CPAP 06/15/2022   PONV (postoperative nausea and vomiting)    Sciatica    Past Surgical History:  Procedure Laterality Date   ABLATION     BREAST LUMPECTOMY WITH RADIOACTIVE SEED AND SENTINEL LYMPH NODE BIOPSY Right 11/23/2020   Procedure: RIGHT BREAST LUMPECTOMY WITH RADIOACTIVE SEED AND RIGHT AXILLARY SENTINEL LYMPH NODE BIOPSY;  Surgeon: Enid Harry, MD;  Location: [facility] OR;  Service: General;  Laterality: Right;   BREAST SURGERY Bilateral 2011   Breast Reduction Surgery   BUNIONECTOMY     CARDIAC CATHETERIZATION     10/05/04 [facility]): LM < 25%, otherwise normal coronaries. No pulmonary HTN, Mildly enlarged RV. Secundum ASD s/p closure.   CARDIAC SURGERY     CATARACT EXTRACTION     CLEFT PALATE REPAIR     s/p cleft lip and palate repair   EYE SURGERY Right 2019   right eye removed   LUMBAR LAMINECTOMY/DECOMPRESSION MICRODISCECTOMY Left 05/23/2016   Procedure: LEFT L4-L5 LATERAL RECESS DECOMPRESSION WITH CENTRAL AND RIGHT DECOMPRESSION VIA LEFT SIDE;  Surgeon: Alphonso Jean, MD;  Location: [facility] OR;  Service: Orthopedics;  Laterality: Left;   LUMBAR LAMINECTOMY/DECOMPRESSION MICRODISCECTOMY Left 05/23/2016   Procedure: LUMBAR LAMINECTOMY/DECOMPRESSION MICRODISCECTOMY Lumbar five - Sacral One 1 LEVEL;  Surgeon: Alphonso Jean, MD;  Location: [facility] OR;  Service: Orthopedics;  Laterality: Left;   REDUCTION MAMMAPLASTY  2011   SHOULDER INJECTION Left 05/23/2016   Procedure: SHOULDER INJECTION;  Surgeon: Alphonso Jean, MD;  Location: [facility] OR;  Service: Orthopedics;  Laterality: Left;  band-aid  per pa-c   TRANSTHORACIC ECHOCARDIOGRAM     12/15/05 [facility]): Mild LVH, EF > 55%, grade 1 diastolic dysfunction, Trivial MR/PR/TR.   TUBAL LIGATION     Family History  Problem Relation Age of Onset   Arthritis Mother    Hypertension Mother    Diabetes Mother    High blood pressure Mother    Cancer Father        Lung   Colon cancer Neg Hx    Colon polyps Neg Hx    Esophageal cancer Neg Hx    Rectal cancer Neg Hx    Stomach cancer  Neg Hx    Social History   Socioeconomic History   Marital status: Married    Spouse name: Charles [name]    Number of children: 3   Years of education: 14   Highest education level: Associate degree: occupational, Scientist, product/process development, or vocational program  Occupational History   Not on file  Tobacco Use   Smoking status: Former    Current packs/day: 0.00    Average packs/day: 0.3 packs/day for 25.0 years (6.3 ttl pk-yrs)    Types: Cigarettes    Start date: 02/04/1993    Quit date: 02/04/2018    Years since quitting: 5.7   Smokeless tobacco: Never  Vaping Use   Vaping status: Never Used  Substance and Sexual Activity   Alcohol  use: No   Drug use: No   Sexual activity: Not Currently  Other Topics Concern   Not on file  Social History Narrative   Tobacco use, amount per day now: None.   Past tobacco use, amount per day: 1/4   How many years did you use tobacco: Intermittent x 20 years   Alcohol  use (drinks per week): N/A   Diet: Plant Base   Do you drink/eat things with caffeine: Coffee, Tea, Soda.   Marital status:    Married                              What year were you married? 1999   Do you live in a house, apartment, assisted living, condo, trailer, etc.? House    Is it one or more stories? 2   How many persons live in your home? 6 adults, 5 children.   Do you have pets in your home?( please list) 1 Terrier   Highest Level of education completed? AAS   Current or past profession: LPN   Do you exercise?   A little                                Type and how often? Walk, stretches.    Do you have a living will? No   Do you have a DNR form?   No                                If not, do you want to discuss one?   Do you have signed POA/HPOA forms? No                       If so, please bring to you appointment      Do you have any difficulty bathing or dressing yourself? No   Do you have any difficulty preparing food or eating? No   Do you have any difficulty managing your medications? No   Do you have any difficulty managing your finances? No   Do you have any difficulty affording your medications? No.   Social Drivers of Health   Financial Resource Strain: Low Risk  (10/18/2023)   Overall Financial Resource Strain (CARDIA)    Difficulty of Paying Living Expenses: Not hard at all  Food Insecurity: No Food Insecurity (10/18/2023)   Hunger Vital Sign    Worried About Running Out of Food in the Last Year: Never true    Ran Out of Food in the Last Year: Never true  Recent Concern: Food  Insecurity - Food Insecurity Present (09/13/2023)   Hunger Vital Sign    Worried About Running Out of Food in the Last Year: Often true    Ran Out of Food in the Last Year: Sometimes true  Transportation Needs: No Transportation Needs (10/18/2023)   PRAPARE - Administrator, Civil Service (Medical): No    Lack of Transportation (Non-Medical): No  Physical Activity: Insufficiently Active (10/18/2023)   Exercise Vital Sign    Days of Exercise per Week: 7 days    Minutes of Exercise per Session: 20 min  Stress: No Stress Concern Present (10/18/2023)   [employer] of Occupational Health - Occupational Stress Questionnaire    Feeling of Stress : Not at all  Social Connections: Moderately Integrated (09/13/2023)   Social Connection and Isolation Panel [NHANES]    Frequency of Communication with Friends and Family: More than three times a week    Frequency of Social Gatherings with Friends and Family: Once a week    Attends Religious  Services: More than 4 times per year    Active Member of [employer] or Organizations: Yes    Attends Banker Meetings: More than 4 times per year    Marital Status: Widowed    Tobacco Counseling Counseling given: Not Answered   Clinical Intake:  Pre-visit preparation completed: No  Pain : 0-10 Pain Score: 5  Pain Type: Chronic pain Pain Location: Back (ankle) Pain Orientation: Left Pain Radiating Towards: left leg Pain Descriptors / Indicators: Constant, Sharp Pain Onset: More than a month ago Pain Frequency: Intermittent Pain Relieving Factors: sitting Effect of Pain on Daily Activities: yes  Pain Relieving Factors: sitting  BMI - recorded: 31.01 Nutritional Status: BMI > 30  Obese Nutritional Risks: None Diabetes: No  How often do you need to have someone help you when you read instructions, pamphlets, or other written materials from your doctor or pharmacy?: 1 - Never What is the last grade level you completed in school?: College Associate degree  Interpreter Needed?: No      Activities of Daily Living    10/18/2023   10:30 AM 10/17/2023    7:47 AM  In your present state of health, do you have any difficulty performing the following activities:  Hearing? 0 0  Vision? 1 1  Difficulty concentrating or making decisions? 0 0  Walking or climbing stairs? 1 1  Dressing or bathing? 0 0  Doing errands, shopping? 1 1  Preparing Food and eating ? N N  Using the Toilet? N N  In the past six months, have you accidently leaked urine? Y Y  Do you have problems with loss of bowel control? N N  Managing your Medications? N N  Managing your Finances? N N  Housekeeping or managing your Housekeeping? Colie Dawes    Patient Care Team: Masha Orbach, Elijio Guadeloupe, NP as PCP - General (Family Medicine) Tobb, Kardie, DO as PCP - Cardiology (Cardiology) Verlene Glimpse, NP as Nurse Practitioner  Indicate any recent Medical Services you may have received from other than [facility]  providers in the past year (date may be approximate).     Assessment:   This is a routine wellness examination for Brandy [name].  Hearing/Vision screen Hearing Screening - Comments:: hearing difficulties  pt doesn't wear any hear aid  Vision Screening - Comments::  up to date with routine eye exams with  08/2023 [facility]    Goals Addressed  This Visit's Progress    Patient Stated   On track    - decrease amount of salt in diet      Patient Stated   Not on track    Eating healthy three times a day       Depression Screen    10/18/2023   10:24 AM 10/05/2023    8:43 AM 09/12/2023    8:21 AM 08/17/2023   10:42 AM 07/20/2023   10:39 AM 07/05/2023    8:32 AM 06/13/2023    8:18 AM  PHQ 2/9 Scores  PHQ - 2 Score 2 0 0 0 0 0 0  PHQ- 9 Score 4   0       Fall Risk    10/18/2023   10:29 AM 10/17/2023    7:47 AM 10/05/2023    8:42 AM 09/13/2023    9:25 AM 09/12/2023    8:20 AM  Fall Risk   Falls in the past year? 1 1 1 1 1   Number falls in past yr: 0 1 1 0 1  Injury with Fall? 0 0 1 0 0  Comment   left ankle still sore, [facility]  other than bumps and bruises and feeling sore.  slipped on the ice in January  Risk for fall due to : No Fall Risks   History of fall(s) History of fall(s)  Follow up Falls evaluation completed   Falls evaluation completed Falls evaluation completed;Education provided    MEDICARE RISK AT HOME: Medicare Risk at Home Any stairs in or around the home?: Yes If so, are there any without handrails?: No Home free of loose throw rugs in walkways, pet beds, electrical cords, etc?: Yes Adequate lighting in your home to reduce risk of falls?: Yes Life alert?: No Use of a cane, walker or w/c?: Yes Grab bars in the bathroom?: Yes Shower chair or bench in shower?: No Elevated toilet seat or a handicapped toilet?: No  TIMED UP AND GO:  Was the test performed?  Yes  Length of time to ambulate 10 feet: 25 sec Gait  unsteady with use of assistive device, provider informed and education provided.     Cognitive Function:    09/27/2022   11:09 AM  MMSE - Mini Mental State Exam  Orientation to time 5  Orientation to Place 5  Registration 3  Attention/ Calculation 5  Recall 3  Language- name 2 objects 2  Language- repeat 1  Language- follow 3 step command 3  Language- read & follow direction 1  Write a sentence 1  Copy design 1  Total score 30        Immunizations Immunization History  Administered Date(s) Administered   Hepatitis A 03/02/2023   Hepatitis B 03/02/2023   Influenza Inj Mdck Quad Pf 04/25/2019   Influenza,inj,Quad PF,6+ Mos 02/24/2020, 04/15/2022, 02/21/2023   Influenza,inj,quad, With Preservative 04/25/2019   Influenza-Unspecified 05/26/2021   Moderna Sars-Covid-2 Vaccination 06/29/2019, 08/03/2019, 06/24/2020   PFIZER(Purple Top)SARS-COV-2 Vaccination 05/26/2021   Pneumococcal Conjugate-13 05/29/2019   Pneumococcal Polysaccharide-23 07/29/2019   Tdap 02/21/2023   Unspecified SARS-COV-2 Vaccination 02/21/2023   Zoster Recombinant(Shingrix) 02/17/2020, 04/20/2020    TDAP status: Up to date  Flu Vaccine status: Up to date  Pneumococcal vaccine status: Up to date  Covid-19 vaccine status: Completed vaccines  Qualifies for Shingles Vaccine? Yes   Zostavax completed No   Shingrix Completed?: Yes  Screening Tests Health Maintenance  Topic Date Due   FOOT  EXAM  Never done   Diabetic kidney evaluation - Urine ACR  Never done   HEMOGLOBIN A1C  01/24/2023   COVID-19 Vaccine (6 - Moderna risk 2024-25 season) 11/03/2023 (Originally 08/21/2023)   INFLUENZA VACCINE  01/05/2024   Pneumococcal Vaccine 66-62 Years old (3 of 3 - PPSV23, PCV20 or PCV21) 07/28/2024   OPHTHALMOLOGY EXAM  08/24/2024   MAMMOGRAM  09/13/2024   Diabetic kidney evaluation - eGFR measurement  10/08/2024   Medicare Annual Wellness (AWV)  10/17/2024   Cervical Cancer Screening (HPV/Pap Cotest)   09/20/2026   Colonoscopy  08/16/2027   DTaP/Tdap/Td (2 - Td or Tdap) 02/20/2033   Hepatitis C Screening  Completed   HIV Screening  Completed   Zoster Vaccines- Shingrix  Completed   HPV VACCINES  Aged Out   Meningococcal B Vaccine  Aged Out    Health Maintenance  Health Maintenance Due  Topic Date Due   FOOT EXAM  Never done   Diabetic kidney evaluation - Urine ACR  Never done   HEMOGLOBIN A1C  01/24/2023    Colorectal cancer screening: Type of screening: Colonoscopy. Completed 08/15/2017. Repeat every 10 years  Mammogram status: Completed 09/14/2022. Repeat every year  Bone Density status: Completed 04/18/2023. Results reflect: Bone density results: NORMAL. Repeat every 5 years.  Lung Cancer Screening: (Low Dose CT Chest recommended if Age 34-80 years, 20 pack-year currently smoking OR have quit w/in 15years.) does qualify.   Lung Cancer Screening Referral: yes   Additional Screening:  Hepatitis C Screening: does qualify; Completed yes  Vision Screening: Recommended annual ophthalmology exams for early detection of glaucoma and other disorders of the eye. Is the patient up to date with their annual eye exam?  Yes  Who is the provider or what is the name of the office in which the patient attends annual eye exams? Dr[facility]  If pt is not established with a provider, would they like to be referred to a provider to establish care? No .   Dental Screening: Recommended annual dental exams for proper oral hygiene  Diabetic Foot Exam: Diabetic Foot Exam: Completed N/a   Community Resource Referral / Chronic Care Management: CRR required this visit?  No   CCM required this visit?  No     Plan:     I have personally reviewed and noted the following in the patient's chart:   Medical and social history Use of alcohol , tobacco or illicit drugs  Current medications and supplements including opioid prescriptions. Patient is currently taking opioid  prescriptions. Information provided to patient regarding non-opioid alternatives. Patient advised to discuss non-opioid treatment plan with their provider. Functional ability and status Nutritional status Physical activity Advanced directives List of other physicians Hospitalizations, surgeries, and ER visits in previous 12 months Vitals Screenings to include cognitive, depression, and falls Referrals and appointments  In addition, I have reviewed and discussed with patient certain preventive protocols, quality metrics, and best practice recommendations. A written personalized care plan for preventive services as well as general preventive health recommendations were provided to patient.     Estil Heman, NP   10/18/2023   After Visit Summary: (In Person-Printed) AVS printed and given to the patient  Nurse Notes: Up to date

## 2023-10-20 ENCOUNTER — Other Ambulatory Visit: Payer: Self-pay | Admitting: Licensed Clinical Social Worker

## 2023-10-20 ENCOUNTER — Other Ambulatory Visit: Payer: Self-pay

## 2023-10-20 DIAGNOSIS — G629 Polyneuropathy, unspecified: Secondary | ICD-10-CM

## 2023-10-20 DIAGNOSIS — C50919 Malignant neoplasm of unspecified site of unspecified female breast: Secondary | ICD-10-CM

## 2023-10-20 DIAGNOSIS — J449 Chronic obstructive pulmonary disease, unspecified: Secondary | ICD-10-CM

## 2023-10-20 DIAGNOSIS — E0849 Diabetes mellitus due to underlying condition with other diabetic neurological complication: Secondary | ICD-10-CM

## 2023-10-22 NOTE — Progress Notes (Signed)
 HEMATOLOGY/ONCOLOGY CLINIC NOTE  Date of Service: 10/23/2023   Patient Care Team: Ngetich, Elijio Guadeloupe, NP as PCP - General (Family Medicine) Tobb, Kardie, DO as PCP - Cardiology (Cardiology) Verlene Glimpse, NP as Nurse Practitioner [facility]  CHIEF COMPLAINTS/PURPOSE OF CONSULTATION:  Follow-up for continued evaluation and management of metastatic ER/PR positive HER2 negative breast cancer  INTERVAL HISTORY:  Brandy [name]  is a 58 y.o. female is here for continued evaluation and management of breast cancer.  Patient was last seen by me on 05/26/2023 and reported left ankle issues involving osteoarthritis and 4 fractures. She also complained of bilateral hip pain and mild occasional bilateral hand cramps.   Patient notes significant social stressors including recently losing her husband which is why she missed her last appointment. No acute issues with ocular inflammation and discomfort and has been seeing ophthalmology for that. No new acute focal symptoms. No new breast pain or palpable lumps. -Tolerating her Faslodex  and Verzenio  well overall other than some grade 1-2 diarrhea from Verzenio  for which she uses Imodium  as needed.    MEDICAL HISTORY:  Past Medical History:  Diagnosis Date   Acute pansinusitis 08/02/2017   Anxiety    Arthritis    ASD (atrial septal defect)    s/p closure with Amplatzer device 10/05/04 (Dr. Kyla Phlegm, [facility]) 10/05/04   Cancer [facility])    Cataract    Chronic pain    CKD (chronic kidney disease)    Dyspnea    Fatty liver 09/04/2019   GERD (gastroesophageal reflux disease)    Heart murmur    no longer heard   History of hiatal hernia    Hyperlipidemia    Legally blind in right eye, as defined in [place]     Lumbar herniated disc    Migraines    On home oxygen  therapy 06/15/2022   Pt was given O2 on D/C from [facility] 04/2022   OSA on CPAP 06/15/2022   PONV (postoperative nausea and vomiting)    Sciatica   Uterine  Fibroids  SURGICAL HISTORY: Past Surgical History:  Procedure Laterality Date   ABLATION     BREAST LUMPECTOMY WITH RADIOACTIVE SEED AND SENTINEL LYMPH NODE BIOPSY Right 11/23/2020   Procedure: RIGHT BREAST LUMPECTOMY WITH RADIOACTIVE SEED AND RIGHT AXILLARY SENTINEL LYMPH NODE BIOPSY;  Surgeon: Enid Harry, MD;  Location: [facility] OR;  Service: General;  Laterality: Right;   BREAST SURGERY Bilateral 2011   Breast Reduction Surgery   BUNIONECTOMY     CARDIAC CATHETERIZATION     10/05/04 [facility]): LM < 25%, otherwise normal coronaries. No pulmonary HTN, Mildly enlarged RV. Secundum ASD s/p closure.   CARDIAC SURGERY     CATARACT EXTRACTION     CLEFT PALATE REPAIR     s/p cleft lip and palate repair   EYE SURGERY Right 2019   right eye removed   LUMBAR LAMINECTOMY/DECOMPRESSION MICRODISCECTOMY Left 05/23/2016   Procedure: LEFT L4-L5 LATERAL RECESS DECOMPRESSION WITH CENTRAL AND RIGHT DECOMPRESSION VIA LEFT SIDE;  Surgeon: Alphonso Jean, MD;  Location: [facility] OR;  Service: Orthopedics;  Laterality: Left;   LUMBAR LAMINECTOMY/DECOMPRESSION MICRODISCECTOMY Left 05/23/2016   Procedure: LUMBAR LAMINECTOMY/DECOMPRESSION MICRODISCECTOMY Lumbar five - Sacral One 1 LEVEL;  Surgeon: Alphonso Jean, MD;  Location: [facility] OR;  Service: Orthopedics;  Laterality: Left;   REDUCTION MAMMAPLASTY  2011   SHOULDER INJECTION Left 05/23/2016   Procedure: SHOULDER INJECTION;  Surgeon: Alphonso Jean, MD;  Location: [facility] OR;  Service: Orthopedics;  Laterality: Left;  band-aid per pa-c   TRANSTHORACIC ECHOCARDIOGRAM     12/15/05 [facility]): Mild LVH, EF > 55%, grade 1 diastolic dysfunction, Trivial MR/PR/TR.   TUBAL LIGATION    Endometrial ablation 2003 Breast Reduction, bilateral 2011  SOCIAL HISTORY: Social History   Socioeconomic History   Marital status: Married    Spouse name: Charles [name]    Number of children: 3   Years of education: 14   Highest education level: Associate degree: occupational, Scientist, product/process development, or  vocational program  Occupational History   Not on file  Tobacco Use   Smoking status: Former    Current packs/day: 0.00    Average packs/day: 0.3 packs/day for 25.0 years (6.3 ttl pk-yrs)    Types: Cigarettes    Start date: 02/04/1993    Quit date: 02/04/2018    Years since quitting: 5.7   Smokeless tobacco: Never  Vaping Use   Vaping status: Never Used  Substance and Sexual Activity   Alcohol  use: No   Drug use: No   Sexual activity: Not Currently  Other Topics Concern   Not on file  Social History Narrative   Tobacco use, amount per day now: None.   Past tobacco use, amount per day: 1/4   How many years did you use tobacco: Intermittent x 20 years   Alcohol  use (drinks per week): N/A   Diet: Plant Base   Do you drink/eat things with caffeine: Coffee, Tea, Soda.   Marital status:    Married                              What year were you married? 1999   Do you live in a house, apartment, assisted living, condo, trailer, etc.? House    Is it one or more stories? 2   How many persons live in your home? 6 adults, 5 children.   Do you have pets in your home?( please list) 1 Terrier   Highest Level of education completed? AAS   Current or past profession: LPN   Do you exercise?   A little                               Type and how often? Walk, stretches.    Do you have a living will? No   Do you have a DNR form?   No                                If not, do you want to discuss one?   Do you have signed POA/HPOA forms? No                       If so, please bring to you appointment      Do you have any difficulty bathing or dressing yourself? No   Do you have any difficulty preparing food or eating? No   Do you have any difficulty managing your medications? No   Do you have any difficulty managing your finances? No   Do you have any difficulty affording your medications? No.   Social Drivers of Health   Financial Resource Strain: Low Risk  (10/18/2023)   Overall Financial  Resource Strain (CARDIA)    Difficulty of Paying Living Expenses: Not hard at all  Food Insecurity: No Food Insecurity (10/18/2023)  Hunger Vital Sign    Worried About Running Out of Food in the Last Year: Never true    Ran Out of Food in the Last Year: Never true  Recent Concern: Food Insecurity - Food Insecurity Present (09/13/2023)   Hunger Vital Sign    Worried About Running Out of Food in the Last Year: Often true    Ran Out of Food in the Last Year: Sometimes true  Transportation Needs: No Transportation Needs (10/18/2023)   PRAPARE - Administrator, Civil Service (Medical): No    Lack of Transportation (Non-Medical): No  Physical Activity: Insufficiently Active (10/18/2023)   Exercise Vital Sign    Days of Exercise per Week: 7 days    Minutes of Exercise per Session: 20 min  Stress: No Stress Concern Present (10/18/2023)   [employer] of Occupational Health - Occupational Stress Questionnaire    Feeling of Stress : Not at all  Social Connections: Moderately Integrated (09/13/2023)   Social Connection and Isolation Panel [NHANES]    Frequency of Communication with Friends and Family: More than three times a week    Frequency of Social Gatherings with Friends and Family: Once a week    Attends Religious Services: More than 4 times per year    Active Member of [employer] or Organizations: Yes    Attends Banker Meetings: More than 4 times per year    Marital Status: Widowed  Intimate Partner Violence: Not At Risk (10/18/2023)   Humiliation, Afraid, Rape, and Kick questionnaire    Fear of Current or Ex-Partner: No    Emotionally Abused: No    Physically Abused: No    Sexually Abused: No    FAMILY HISTORY: Family History  Problem Relation Age of Onset   Arthritis Mother    Hypertension Mother    Diabetes Mother    High blood pressure Mother    Cancer Father        Lung   Colon cancer Neg Hx    Colon polyps Neg Hx    Esophageal cancer Neg Hx     Rectal cancer Neg Hx    Stomach cancer Neg Hx     ALLERGIES:  is allergic to zithromax [azithromycin], tramadol , and psyllium.  MEDICATIONS:  Current Outpatient Medications  Medication Sig Dispense Refill   acetaminophen  (TYLENOL ) 500 MG tablet Take 500 mg by mouth daily as needed for moderate pain.     albuterol  (VENTOLIN  HFA) 108 (90 Base) MCG/ACT inhaler Inhale 2 puffs into the lungs every 6 (six) hours as needed for wheezing or shortness of breath. 8 g 6   ALPRAZolam  (XANAX ) 1 MG tablet TAKE 1 TABLET BY MOUTH AT  BEDTIME AS NEEDED FOR ANXIETY 30 tablet 5   amitriptyline  (ELAVIL ) 75 MG tablet Take 1 tablet (75 mg total) by mouth at bedtime. 90 tablet 3   Buprenorphine  HCl (BELBUCA ) 300 MCG FILM Place 300 mcg inside cheek 2 (two) times daily. 60 each 2   Carboxymethylcellul-Glycerin (LUBRICATING EYE DROPS OP) Place 1 drop into the right eye in the morning, at noon, and at bedtime.     cyanocobalamin  (VITAMIN B12) 500 MCG tablet Take 1 tablet (500 mcg total) by mouth daily. 30 tablet 0   Evolocumab  (REPATHA  SURECLICK) 140 MG/ML SOAJ Inject 140 mg into the skin every 14 (fourteen) days. 6 mL 3   fluticasone -salmeterol (ADVAIR) 250-50 MCG/ACT AEPB Inhale 1 puff into the lungs in the morning and at bedtime. 180 each 3   ketoconazole (  NIZORAL) 2 % cream Apply 1 Application topically 2 (two) times daily.     loperamide  (IMODIUM ) 2 MG capsule Take 1-2 capsules (2-4 mg total) by mouth 4 (four) times daily as needed for diarrhea or loose stools. 30 capsule 1   magnesium  30 MG tablet Take 30 mg by mouth 2 (two) times daily.     methocarbamol  (ROBAXIN ) 500 MG tablet Take 1 tablet (500 mg total) by mouth every 6 (six) hours as needed for muscle spasms. 40 tablet 2   midodrine  (PROAMATINE ) 10 MG tablet TAKE 1 TABLET BY MOUTH IN THE  MORNING AND AT BEDTIME 180 tablet 1   Multiple Vitamin (MULTIVITAMIN WITH MINERALS) TABS tablet Take 1 tablet by mouth daily.     omeprazole  (PRILOSEC  OTC) 20 MG tablet Take  20 mg by mouth daily.     PRESCRIPTION MEDICATION CPAP- At bedtime     triamcinolone  cream (KENALOG ) 0.1 % Apply 1 Application topically 2 (two) times daily. 30 g 0   VERZENIO  100 MG tablet TAKE 1 TABLET BY MOUTH TWICE DAILY 56 tablet 0   Vitamin D , Ergocalciferol , (DRISDOL ) 1.25 MG (50000 UNIT) CAPS capsule TAKE 1 CAPSULE BY MOUTH EVERY 7  DAYS ( MONDAY ) 5 capsule 9   No current facility-administered medications for this visit.    REVIEW OF SYSTEMS:    10 Point review of Systems was done is negative except as noted above.   PHYSICAL EXAMINATION: ECOG FS:1 - Symptomatic but completely ambulatory  There were no vitals filed for this visit.  Wt Readings from Last 3 Encounters:  10/18/23 207 lb (93.9 kg)  10/09/23 198 lb (89.8 kg)  10/05/23 198 lb (89.8 kg)   There is no height or weight on file to calculate BMI.     GENERAL:alert, in no acute distress and comfortable SKIN: no acute rashes, no significant lesions EYES: conjunctiva are pink and non-injected, sclera anicteric OROPHARYNX: MMM, no exudates, no oropharyngeal erythema or ulceration NECK: supple, no JVD LYMPH:  no palpable lymphadenopathy in the cervical, axillary or inguinal regions LUNGS: clear to auscultation b/l with normal respiratory effort HEART: regular rate & rhythm ABDOMEN:  normoactive bowel sounds , non tender, not distended. Extremity: no pedal edema PSYCH: alert & oriented x 3 with fluent speech NEURO: no focal motor/sensory deficits   LABORATORY DATA:  I have reviewed the data as listed     Latest Ref Rng & Units 10/23/2023    1:44 PM 10/09/2023   11:08 AM 09/25/2023    1:33 PM  CBC  WBC 4.0 - 10.5 K/uL 4.5  3.8  4.4   Hemoglobin 12.0 - 15.0 g/dL 16.1  09.6  04.5   Hematocrit 36.0 - 46.0 % 33.9  36.1  37.0   Platelets 150 - 400 K/uL 204  199  266        Latest Ref Rng & Units 10/23/2023    1:44 PM 10/09/2023   11:08 AM 09/25/2023    1:33 PM  CMP  Glucose 70 - 99 mg/dL 76  86  78   BUN 6 - 20  mg/dL 23  12  18    Creatinine 0.44 - 1.00 mg/dL 4.09  8.11  9.14   Sodium 135 - 145 mmol/L 141  141  140   Potassium 3.5 - 5.1 mmol/L 4.1  4.2  4.4   Chloride 98 - 111 mmol/L 105  100  103   CO2 22 - 32 mmol/L 29  33  31   Calcium   8.9 - 10.3 mg/dL 9.6  9.5  9.8   Total Protein 6.5 - 8.1 g/dL 7.6   7.4   Total Bilirubin 0.0 - 1.2 mg/dL 0.9   0.8   Alkaline Phos 38 - 126 U/L 227   241   AST 15 - 41 U/L 69   54   ALT 0 - 44 U/L 54   44          RADIOGRAPHIC STUDIES: I have personally reviewed the radiological images as listed and agreed with the findings in the report. US  ABDOMEN LIMITED RUQ (LIVER/GB) Result Date: 09/25/2023 CLINICAL DATA:  elevated liver enzymes EXAM: ULTRASOUND ABDOMEN LIMITED RIGHT UPPER QUADRANT COMPARISON:  May 19, 2023 FINDINGS: Evaluation is limited secondary to shadowing bowel gas and non NPO status. Gallbladder: No gallstones or wall thickening visualized. Previously described cholelithiasis are not visualized on today's exam. No sonographic Murphy sign noted by sonographer. Common bile duct: Diameter: Visualized portion measures 5 mm, within normal limits. Liver: No focal lesion identified. Upper limits of normal in parenchymal echogenicity. Portal vein is patent on color Doppler imaging with normal direction of blood flow towards the liver. Other: None. IMPRESSION: No sonographic etiology for elevated liver enzymes identified. Electronically Signed   By: Clancy Crimes M.D.   On: 09/25/2023 13:25    MM DIAG BREAST TOMO BILATERAL  Result Date: 01/04/2022 CLINICAL DATA:  Patient has a history of metastatic right breast cancer diagnosed in October of 2020. Patient is status post right breast lumpectomy in June of 2022. EXAM: DIGITAL DIAGNOSTIC BILATERAL MAMMOGRAM WITH TOMOSYNTHESIS TECHNIQUE: Bilateral digital diagnostic mammography and breast tomosynthesis was performed. COMPARISON:  Previous exam(s). ACR Breast Density Category b: There are scattered areas  of fibroglandular density. FINDINGS: Cc and MLO views of bilateral breasts are submitted. Postsurgical changes are identified in the right breast. The left breast is stable. IMPRESSION: Benign findings. RECOMMENDATION: Bilateral diagnostic mammogram in 1 year. I have discussed the findings and recommendations with the patient. If applicable, a reminder letter will be sent to the patient regarding the next appointment. BI-RADS CATEGORY  2: Benign. Electronically Signed   By: Anna Barnes M.D.   On: 01/04/2022 16:27    09/02/2020 Surgical Pathology     ASSESSMENT & PLAN:   AVIANA SHEVLIN [name]  is a 58 y.o. female with:  1. Metastatic breast cancer ER+/PRneg/Her2 neg  02/28/2019 neck CT with results revealing "negative for mass or adenopathy in the neck."  03/04/2019 head MRI with results revealing "Negative for metastatic disease.  No acute abnormality in the brain."  01/04/2022 Mammogram benign  2. Pulmonary metastases  02/18/2019 chest and abdomen with results revealing "5cm spiculated soft tissue mass in inferior right breast, highly suspicious for primary breast carcinoma. No acute findings or metastatic disease within the abdomen or pelvis. Multiple small pulmonary nodules in both lung bases, consistent with pulmonary metastases. 4.5 cm uterine fibroid."  02/28/2019 C/A/P CT with results revealing "Irregular solid 5.0 cm right breast mass, suspicious for primary right breast malignancy. Innumerable solid pulmonary nodules scattered throughout both lungs, compatible with pulmonary metastases. No evidence of metastatic disease in the abdomen, pelvis or skeleton. Mildly enlarged and probably myomatous uterus. Simple 1.4 cm left adnexal cyst requires no follow-up. This recommendation follows ACR consensus guidelines: White Paper of the ACR Incidental Findings Committee II on Adnexal Findings. J Am Coll Radiol [phone]. Aortic Atherosclerosis (ICD10-I70.0)."  NUCLEAR MEDICINE  WHOLE BODY BONE SCAN completed on 03/19/2019 with results revealing "1. No scintigraphic evidence skeletal  metastasis. 2. Degenerative bone disease in the posterior elements of the upper and mid lumbar spine."   04/09/2019 Bone Density ([id number]) which revealed "The BMD measured at Femur Neck Right is 1.153 g/cm2 with a T-score of 0.8. This patient is considered normal according to World Health Organization [facility]) criteria. Lumbar spine was not utilized due to advanced degenerative changes. The scan quality is good. Femur Neck Right 04/09/2019 54.7 Normal 0.8 1.153 g/cm2. Left Forearm Radius 33% 04/09/2019 54.7 Normal 0.8 0.949 g/cm2."  08/04/2019 CT Angio Chest ([id number]) revealed "1. No lobar or central pulmonary embolus detected. Exam is limited secondary to respiratory motion. 2. Mild ground-glass attenuation may represent mild pneumonitis or areas of air trapping. 3. Signs of atrial septal closure. 4. Decrease in size and number of bilateral pulmonary nodules, marked response noted on today's exam with the only nodule remaining near a cm in the right upper lobe and the smaller nodules that were present on the previous examination throughout the chest no longer measurable though the lower lobes are limited by respiratory motion. 5. Decreased size of right breast mass. 6. Probable hepatic steatosis."  S/p rt breast lumpectomy on 6/20 -- no residual carcinoma on pathology.  3. Elevated LFTs 2/2 extensive hepatic steatosis -Under the care of Dr. Leonia Raman and last seen on 03/26/2020 -07/03/2019 US  Abd revealed "No acute findings. Normal gallbladder. No bile duct dilation. 2. Significant increased liver parenchymal echogenicity consistent with extensive hepatic steatosis." PLAN:  -discussed lab results from today, 10/23/2023, in detail with patient. - cbc showed normal WBC count and platelets and a hemoglobin of 11.3 - CMP shows variable abnormal liver function tests and some mild dehydration. -  Grade 1 diarrhea due to Verzenio .  Patient was counseled to increase p.o. fluid intake and use Imodium  as needed.  She has not interested in reducing the dose of Verzenio  at this time. - Will repeat breast cancer tumor markers with next labs - Continue Verzenio  at 100 mg p.o. twice daily and monthly Faslodex . - Continue follow-up with primary care physician for management of fatty liver and other chronic medical issues. - Patient follows with pain management for chronic pain management. - She has had a lot of difficulties with lab draws and requests a Port-A-Cath placement for which order has been placed. - is due for her Yearly mammogram she will schedule this  FOLLOW-UP:  RTC with Dr Salomon Cree with portflush labs in 8 weeks Port a cath placement for breast cancer treatment and poor IV access Plz schedule monthly faslodex  x 12 with portflush and portflush labs   The total time spent in the appointment was 32 minutes* .  All of the patient's questions were answered with apparent satisfaction. The patient knows to call the clinic with any problems, questions or concerns.   Jacquelyn Matt MD MS AAHIVMS [facility] Hematology/Oncology Physician [facility]  .*Total Encounter Time as defined by the Centers for Medicare and Medicaid Services includes, in addition to the face-to-face time of a patient visit (documented in the note above) non-face-to-face time: obtaining and reviewing outside history, ordering and reviewing medications, tests or procedures, care coordination (communications with other health care professionals or caregivers) and documentation in the medical record.    I,Mitra Faeizi,acting as a Neurosurgeon for Jacquelyn Matt, MD.,have documented all relevant documentation on the behalf of Jacquelyn Matt, MD,as directed by  Jacquelyn Matt, MD while in the presence of Jacquelyn Matt, MD.  .I have reviewed the above documentation for accuracy and completeness, and  I agree with the  above. .Windel Keziah Kishore Cheri Ayotte MD

## 2023-10-23 ENCOUNTER — Inpatient Hospital Stay (HOSPITAL_BASED_OUTPATIENT_CLINIC_OR_DEPARTMENT_OTHER): Admitting: Hematology

## 2023-10-23 ENCOUNTER — Inpatient Hospital Stay: Payer: Self-pay

## 2023-10-23 ENCOUNTER — Inpatient Hospital Stay: Payer: Self-pay | Attending: Hematology

## 2023-10-23 VITALS — BP 125/59 | HR 83 | Temp 97.2°F | Resp 20 | Wt 204.9 lb

## 2023-10-23 DIAGNOSIS — C50511 Malignant neoplasm of lower-outer quadrant of right female breast: Secondary | ICD-10-CM | POA: Insufficient documentation

## 2023-10-23 DIAGNOSIS — R197 Diarrhea, unspecified: Secondary | ICD-10-CM

## 2023-10-23 DIAGNOSIS — Z170 Estrogen receptor positive status [ER+]: Secondary | ICD-10-CM | POA: Insufficient documentation

## 2023-10-23 DIAGNOSIS — Z1732 Human epidermal growth factor receptor 2 negative status: Secondary | ICD-10-CM | POA: Insufficient documentation

## 2023-10-23 DIAGNOSIS — Z5111 Encounter for antineoplastic chemotherapy: Secondary | ICD-10-CM | POA: Insufficient documentation

## 2023-10-23 DIAGNOSIS — C50919 Malignant neoplasm of unspecified site of unspecified female breast: Secondary | ICD-10-CM

## 2023-10-23 DIAGNOSIS — C7800 Secondary malignant neoplasm of unspecified lung: Secondary | ICD-10-CM | POA: Diagnosis not present

## 2023-10-23 DIAGNOSIS — Z1721 Progesterone receptor positive status: Secondary | ICD-10-CM | POA: Diagnosis not present

## 2023-10-23 LAB — CBC WITH DIFFERENTIAL (CANCER CENTER ONLY)
Abs Immature Granulocytes: 0.01 10*3/uL (ref 0.00–0.07)
Basophils Absolute: 0 10*3/uL (ref 0.0–0.1)
Basophils Relative: 1 %
Eosinophils Absolute: 0.1 10*3/uL (ref 0.0–0.5)
Eosinophils Relative: 1 %
HCT: 33.9 % — ABNORMAL LOW (ref 36.0–46.0)
Hemoglobin: 11.3 g/dL — ABNORMAL LOW (ref 12.0–15.0)
Immature Granulocytes: 0 %
Lymphocytes Relative: 55 %
Lymphs Abs: 2.4 10*3/uL (ref 0.7–4.0)
MCH: 34.5 pg — ABNORMAL HIGH (ref 26.0–34.0)
MCHC: 33.3 g/dL (ref 30.0–36.0)
MCV: 103.4 fL — ABNORMAL HIGH (ref 80.0–100.0)
Monocytes Absolute: 0.3 10*3/uL (ref 0.1–1.0)
Monocytes Relative: 7 %
Neutro Abs: 1.6 10*3/uL — ABNORMAL LOW (ref 1.7–7.7)
Neutrophils Relative %: 36 %
Platelet Count: 204 10*3/uL (ref 150–400)
RBC: 3.28 MIL/uL — ABNORMAL LOW (ref 3.87–5.11)
RDW: 14.4 % (ref 11.5–15.5)
WBC Count: 4.5 10*3/uL (ref 4.0–10.5)
nRBC: 0 % (ref 0.0–0.2)

## 2023-10-23 LAB — CMP (CANCER CENTER ONLY)
ALT: 54 U/L — ABNORMAL HIGH (ref 0–44)
AST: 69 U/L — ABNORMAL HIGH (ref 15–41)
Albumin: 4 g/dL (ref 3.5–5.0)
Alkaline Phosphatase: 227 U/L — ABNORMAL HIGH (ref 38–126)
Anion gap: 7 (ref 5–15)
BUN: 23 mg/dL — ABNORMAL HIGH (ref 6–20)
CO2: 29 mmol/L (ref 22–32)
Calcium: 9.6 mg/dL (ref 8.9–10.3)
Chloride: 105 mmol/L (ref 98–111)
Creatinine: 1.51 mg/dL — ABNORMAL HIGH (ref 0.44–1.00)
GFR, Estimated: 40 mL/min — ABNORMAL LOW (ref >60)
Glucose, Bld: 76 mg/dL (ref 70–99)
Potassium: 4.1 mmol/L (ref 3.5–5.1)
Sodium: 141 mmol/L (ref 135–145)
Total Bilirubin: 0.9 mg/dL (ref 0.0–1.2)
Total Protein: 7.6 g/dL (ref 6.5–8.1)

## 2023-10-23 MED ORDER — IBUPROFEN 200 MG PO TABS
400.0000 mg | ORAL_TABLET | Freq: Once | ORAL | Status: DC
  Filled 2023-10-23: qty 2

## 2023-10-23 MED ORDER — FULVESTRANT 250 MG/5ML IM SOSY
500.0000 mg | PREFILLED_SYRINGE | Freq: Once | INTRAMUSCULAR | Status: AC
  Administered 2023-10-23: 500 mg via INTRAMUSCULAR
  Filled 2023-10-23: qty 10

## 2023-10-23 MED ORDER — DIPHENHYDRAMINE HCL 25 MG PO CAPS
25.0000 mg | ORAL_CAPSULE | Freq: Once | ORAL | Status: DC
  Filled 2023-10-23: qty 1

## 2023-10-23 MED ORDER — FAMOTIDINE 20 MG PO TABS
20.0000 mg | ORAL_TABLET | Freq: Once | ORAL | Status: DC
  Filled 2023-10-23: qty 1

## 2023-10-24 ENCOUNTER — Other Ambulatory Visit: Payer: Self-pay

## 2023-10-24 ENCOUNTER — Ambulatory Visit: Attending: Family | Admitting: Rehabilitation

## 2023-10-24 ENCOUNTER — Encounter: Payer: Self-pay | Admitting: Rehabilitation

## 2023-10-24 ENCOUNTER — Other Ambulatory Visit: Payer: Self-pay | Admitting: Hematology

## 2023-10-24 DIAGNOSIS — R293 Abnormal posture: Secondary | ICD-10-CM | POA: Insufficient documentation

## 2023-10-24 DIAGNOSIS — M6281 Muscle weakness (generalized): Secondary | ICD-10-CM | POA: Insufficient documentation

## 2023-10-24 DIAGNOSIS — R2689 Other abnormalities of gait and mobility: Secondary | ICD-10-CM | POA: Insufficient documentation

## 2023-10-24 DIAGNOSIS — M5459 Other low back pain: Secondary | ICD-10-CM | POA: Diagnosis not present

## 2023-10-24 DIAGNOSIS — C50919 Malignant neoplasm of unspecified site of unspecified female breast: Secondary | ICD-10-CM

## 2023-10-24 DIAGNOSIS — R2681 Unsteadiness on feet: Secondary | ICD-10-CM | POA: Diagnosis not present

## 2023-10-24 MED ORDER — ABEMACICLIB 100 MG PO TABS: 100.0000 mg | ORAL_TABLET | Freq: Two times a day (BID) | ORAL | 0 refills | Status: DC

## 2023-10-24 NOTE — Therapy (Signed)
 OUTPATIENT PHYSICAL THERAPY NEURO NOTE/RECERT   Patient Name: Brandy [name] [name]  MRN: [id number] DOB:Sep 17, 1965, 58 y.o., female Today's Date: 10/24/2023   PCP: Estil Heman, NP  REFERRING PROVIDER: Estil Heman, NP     END OF SESSION:  PT End of Session - 10/24/23 0814     Visit Number 14    Number of Visits 19    Date for PT Re-Evaluation 12/01/23   per recert 10/17/2023   Authorization Type [employer] Medicare-submitted for reauth after 10/17/2023 visit    Authorization Time Period approved 6 visits from 10/17/23-11/28/23   new auth submitted   Authorization - Visit Number 1    Authorization - Number of Visits 6    PT Start Time 1100    PT Stop Time 1145    PT Time Calculation (min) 45 min    Equipment Utilized During Treatment Other (comment)   floatation devices as needed for safety   Activity Tolerance Patient tolerated treatment well    Behavior During Therapy [facility] for tasks assessed/performed                      Past Medical History:  Diagnosis Date   Acute pansinusitis 08/02/2017   Anxiety    Arthritis    ASD (atrial septal defect)    s/p closure with Amplatzer device 10/05/04 (Dr. Kyla Phlegm, [facility]) 10/05/04   Cancer [facility])    Cataract    Chronic pain    CKD (chronic kidney disease)    Dyspnea    Fatty liver 09/04/2019   GERD (gastroesophageal reflux disease)    Heart murmur    no longer heard   History of hiatal hernia    Hyperlipidemia    Legally blind in right eye, as defined in [place]     Lumbar herniated disc    Migraines    On home oxygen  therapy 06/15/2022   Pt was given O2 on D/C from [facility] 04/2022   OSA on CPAP 06/15/2022   PONV (postoperative nausea and vomiting)    Sciatica    Past Surgical History:  Procedure Laterality Date   ABLATION     BREAST LUMPECTOMY WITH RADIOACTIVE SEED AND SENTINEL LYMPH NODE BIOPSY Right 11/23/2020   Procedure: RIGHT BREAST LUMPECTOMY WITH RADIOACTIVE SEED AND RIGHT AXILLARY SENTINEL LYMPH  NODE BIOPSY;  Surgeon: Enid Harry, MD;  Location: [facility] OR;  Service: General;  Laterality: Right;   BREAST SURGERY Bilateral 2011   Breast Reduction Surgery   BUNIONECTOMY     CARDIAC CATHETERIZATION     10/05/04 [facility]): LM < 25%, otherwise normal coronaries. No pulmonary HTN, Mildly enlarged RV. Secundum ASD s/p closure.   CARDIAC SURGERY     CATARACT EXTRACTION     CLEFT PALATE REPAIR     s/p cleft lip and palate repair   EYE SURGERY Right 2019   right eye removed   LUMBAR LAMINECTOMY/DECOMPRESSION MICRODISCECTOMY Left 05/23/2016   Procedure: LEFT L4-L5 LATERAL RECESS DECOMPRESSION WITH CENTRAL AND RIGHT DECOMPRESSION VIA LEFT SIDE;  Surgeon: Alphonso Jean, MD;  Location: [facility] OR;  Service: Orthopedics;  Laterality: Left;   LUMBAR LAMINECTOMY/DECOMPRESSION MICRODISCECTOMY Left 05/23/2016   Procedure: LUMBAR LAMINECTOMY/DECOMPRESSION MICRODISCECTOMY Lumbar five - Sacral One 1 LEVEL;  Surgeon: Alphonso Jean, MD;  Location: [facility] OR;  Service: Orthopedics;  Laterality: Left;   REDUCTION MAMMAPLASTY  2011   SHOULDER INJECTION Left 05/23/2016   Procedure: SHOULDER INJECTION;  Surgeon: Alphonso Jean, MD;  Location: [facility] OR;  Service:  Orthopedics;  Laterality: Left;  band-aid per pa-c   TRANSTHORACIC ECHOCARDIOGRAM     12/15/05 [facility]): Mild LVH, EF > 55%, grade 1 diastolic dysfunction, Trivial MR/PR/TR.   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 (HCC) 09/13/2023   Chronic obstructive pulmonary disease, unspecified COPD type (HCC) 03/15/2023   Major depressive disorder, single episode, severe without psychotic features (HCC) 03/15/2023   Orthostatic hypotension 04/24/2022   Hyperbilirubinemia 04/24/2022   Snoring 07/14/2021   Fatigue 07/14/2021   Daytime somnolence 07/14/2021   Status post device closure of ASD 06/03/2021    Secundum ASD 06/03/2021   Decreased diffusion capacity 05/14/2021   Dyspnea 03/04/2021   Pulmonary nodules 04/01/2020   Primary malignant neoplasm of breast with metastasis (HCC) 02/24/2020   Spondylosis without myelopathy or radiculopathy, lumbosacral region 12/31/2019   Chronic low back pain (Bilateral) w/o sciatica 12/31/2019   Chronic hip pain (3ry area of Pain) (Bilateral) (R>L) 11/27/2019   Chronic sacroiliac joint pain (Left) 11/27/2019   Chronic pain syndrome 11/11/2019   Encounter for chronic pain management 11/11/2019   Disorder of skeletal system 11/11/2019   Problems influencing health status 11/11/2019   Abnormal MRI, lumbar spine (08/22/2018) 11/11/2019   Chronic low back pain (1ry area of Pain) (Bilateral) w/ sciatica (Bilateral) 11/11/2019   DDD (degenerative disc disease), lumbosacral 11/11/2019   Grade 1 Anterolisthesis (L2-3, L3-4) 11/11/2019   Grade 1 Retrolisthesis (L4-5, L5-S1) 11/11/2019   Lumbar facet hypertrophy (Multilevel) (Bilateral) 11/11/2019   Lumbar facet syndrome (Multilevel) (Bilateral) (R>L) 11/11/2019   Chronic lower extremity pain (2ry area of Pain) (Bilateral) (R>L) 11/11/2019   Neurogenic pain 11/11/2019   Chronic musculoskeletal pain 11/11/2019   Infiltrating ductal carcinoma of breast (HCC) 10/08/2019   Fatty liver 09/04/2019   Obesity (BMI 30-39.9) 09/04/2019   GERD (gastroesophageal reflux disease)    Hyperlipidemia    CKD (chronic kidney disease), stage III (HCC)    Depression    Malignant hypertension    Ocular proptosis 02/14/2019   PVD (posterior vitreous detachment), left eye 02/14/2019   Chorioretinal scar of left eye 02/14/2019   Vitreous floaters of left eye 02/14/2019   Bilateral leg weakness 01/24/2019   Insomnia 08/02/2017   Low back pain radiating to both legs 08/02/2017   Migraine 08/02/2017   Neuropathy 08/02/2017   Spinal stenosis, 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    Class: Chronic   Left shoulder  tendonitis 05/23/2016    Class: Acute   Post laminectomy syndrome 05/23/2016   Anxiety disorder 01/07/2015    ONSET DATE: 07/20/2023 (MD referral)  REFERRING DIAG:  M54.16 (ICD-10-CM) - Lumbar back pain with radiculopathy affecting left lower extremity  R26.81 (ICD-10-CM) - Unsteady gait  R29.6 (ICD-10-CM) - Frequent falls    THERAPY DIAG:  Unsteadiness on feet  Muscle weakness (generalized)  Other abnormalities of gait and mobility  Other low back pain  Abnormal posture  Rationale for Evaluation and Treatment: Rehabilitation  SUBJECTIVE:  SUBJECTIVE STATEMENT: Pt excited to be back after having illness and waiting on approval for more therapy.  Still having some ankle issues and reports balance still not "great."    Pt accompanied by: self  PERTINENT HISTORY: Per MD:  She has been experiencing worsening back pain following a fall last month during a snowstorm. She slipped on ice and fell, initially without pain, but began experiencing consistent pain about a week later. The pain is primarily located in the lower back, radiating more to the left side, with tenderness in the buttocks and back of the thigh. On February 9th, she experienced severe pain radiating from her left foot up her leg, causing her to fall back into bed. No current pain radiating down the leg, weakness, tingling or numbness. No swelling or bruising in the affected areas. She has a history of back pain and underwent a nerve ablation procedure at the end of January to manage her symptoms.  PMH:  breast cancer with metastasis;  Lumbar laminectomy/decompression microdiscectomy (Left, 05/23/2016); Lumbar laminectomy/decompression microdiscectomy (Left, 05/23/2016); Shoulder injection (Left, 05/23/2016); Reduction mammaplasty (2011);  Eye surgery (Right, 2019); Tubal ligation; and Breast lumpectomy with radioactive seed and sentinel lymph node biopsy (Right, 11/23/2020).   PAIN: 0/10  PRECAUTIONS: Fall; decreased vision Per MD note:  She has issues with her eyes, specifically dry eyes, requiring the use of artificial tears every hour. She has a prosthetic eye (R) and uses lubricant and tapes her left eyelid shut at night to prevent dryness. Her vision can be distorted at times, affecting her daily activities.   RED FLAGS: None   WEIGHT BEARING RESTRICTIONS: No  FALLS: Has patient fallen in last 6 months? Yes. Number of falls at least 3 falls  LIVING ENVIRONMENT: Lives with: lives with their spouse Lives in: House/apartment Stairs: 6 steps, multiple steps -getting ready to move into apartment where there are no steps Has following equipment at home: Single point cane and Walker - 4 wheeled  PLOF: Independent with household mobility with device and Independent with community mobility with device  PATIENT GOALS: Pt's goals for therapy become painfree, more independent, exercise more; 10/17/2023:  better balance  OBJECTIVE:   Today's Treatment:  10/24/23  Patient seen for aquatic therapy today.  Treatment took place in water 3.6-4.0 feet deep depending upon activity.  Pt entered and exited the pool via stairs using rails for support.  Pool temp approx 92 deg.    Warm Up:  Walking forwards/backwards/laterally x 4 laps each (approx 18') without support.  Cues for posture and correct foot placement.  At times she tends to be very stiff and holds tension in shoulders.     Side stepping holding yellow barbells in water throughout x 4 laps.    Forward marching using yellow barbells for support x 2 laps, adding in alt UE motions x 2 more laps with min/guard for occasional LOB/unsteadiness.  Standing at wall for single UE support, moving single LE in hip flex/ext with knee ext without stopping for more strength x 10 reps then  having her do lighter support at wall moving into flex/ext then touching floor for more balance challenge x 10 reps on each side.     Stretching: With heels off first step, performed alt calf stretch x 10 reps ending with longer hold x 30 secs on each side.       Sit<>stand from third step (feet on first step) x 10 reps without UE support but reaching forward to encourage forward weight  shift.  Forward step downs with R and LLE x 10 reps each.  PT did move step to deeper water to reduce L ankle pain.  Forward step ups x 10 reps with opposite LE march.  Ended with lateral step up/down without UE support x 10 reps.     Discussed that PT would establish HEP for pt and have ready at next visit.         Pt requires buoyancy of water for support for reduced fall risk and for unloading/reduced stress on joints (spine and ankle) as pt able to tolerate increased standing and ambulation in water compared to that on land; viscosity of water is needed for resistance for strengthening and current of water provides perturbations for challenge for balance training       PATIENT EDUCATION: Education details: aquatic rationale and aquatic HEP  Person educated: Patient Education method: Explanation Education comprehension: verbalized understanding    HOME EXERCISE PROGRAM Access Code: [email or web address] URL: [email or web address] Date: 08/29/2023 Prepared by: [facility] - Outpatient  Rehab - [facility]  Exercises - Supine Posterior Pelvic Tilt  - 1 x daily - 7 x weekly - 1-2 sets - 10 reps - Supine Hamstring Stretch with Strap  - 1 x daily - 5 x weekly - 2 sets - 30 sec hold - Supine Lower Trunk Rotation  - 1 x daily - 5 x weekly - 2 sets - 20 reps - Seated Anterior Pelvic Tilt  - 1 x daily - 7 x weekly - 1-2 sets - 10 reps - Wide Tandem Stance with Eyes Open  - 1 x daily - 5 x weekly - 2 sets - 30 sec hold - Standing Toe Taps  - 1 x daily - 5 x weekly - 2 sets - 10 reps - Standing  Shoulder Row with Anchored Resistance  - 1 x daily - 5 x weekly - 2 sets - 10 reps - Standing Anti-Rotation Press with Anchored Resistance  - 1 x daily - 7 x weekly - 3 sets - 10 reps - 2 sec hold - Hooklying Isometric Hip Flexion with Opposite Arm  - 1 x daily - 7 x weekly - 3 sets - 10 reps - 2 sec hold     ---------------------------------------------  Note: Objective measures were completed at Evaluation unless otherwise noted.  DIAGNOSTIC FINDINGS: x-rays to be completed 07/26/2023  COGNITION: Overall cognitive status: Within functional limits for tasks assessed   SENSATION: No reports of numbness   POSTURE: rounded shoulders, forward head, and posterior pelvic tilt  LOWER EXTREMITY ROM:     Active  Right Eval Left Eval  Hip flexion    Hip extension    Hip abduction    Hip adduction    Hip internal rotation    Hip external rotation    Knee flexion    Knee extension    Ankle dorsiflexion 5 5  Ankle plantarflexion    Ankle inversion    Ankle eversion     (Blank rows = not tested)  LOWER EXTREMITY MMT:    MMT Right Eval Left Eval  Hip flexion 4+ 4+  Hip extension    Hip abduction 4 4  Hip adduction 4 4  Hip internal rotation    Hip external rotation    Knee flexion 4 4  Knee extension 4+ 4+  Ankle dorsiflexion 4 4  Ankle plantarflexion    Ankle inversion    Ankle eversion    (Blank rows = not tested)  LUMBAR: Palpation:  Tender to palpation along paraspinals thoracic spine, SI joint area, bilateral buttocks/gluts/piriformis; pt tender to palpation along IT band R>L Baseline pain in standing:  0/10 Repeated standing flexion: 3/10 pain, into bilat buttocks Repeated standing extension:1-2/10 pain centralized at mid- low back Limited in lumbar extension flexibility  Hamstring flexibility from supine 90/90:  R  -15 degrees; L -20 degrees SLR test from supine:  R 85 degrees, L 80 degrees  TRANSFERS: Assistive device utilized: None BUE support Sit to  stand: Modified independence Stand to sit: Modified independence   GAIT: Gait pattern: step through pattern, antalgic, lateral lean- Right, and lateral lean- Left Distance walked: 40-50 ft Assistive device utilized: Single point cane Level of assistance: Modified independence Comments: Has rollator at home, reports less pain with gait when using rollator   PATIENT SURVEYS:  Modified Oswestry (Back Index):  48                                                                                                                                GOALS: Goals reviewed with patient? Yes  SHORT TERM GOALS: Target date: 08/25/2023  Pt will be independent with HEP for improved strength, flexibility, balance, gait. Baseline: Pt denies questions on HEP 08/23/23 Goal status: MET  08/23/23  2.  Pt will report low back pain reduced by 50% during functional daily activities. Baseline:  Reports LBP is "touch and go." Reports that is it a "teeny weeny bit better" 08/24/23  Goal status: IN PROGRESS 08/24/23   3. Patient to score at least 46/56 on Berg in order to decrease risk of falls.  Baseline: 42 08/24/23; 52/56 09/19/23 Goal status:MET 09/19/23  4.  Pt will verbalize understanding of fall prevention and posture/body mechanics for decreased pain/decreased fall risk.  Baseline: provided today 08/23/23  Goal status: MET 08/23/23   LONG TERM GOALS: UPDATED TARGET 12/01/2023  Pt will be independent with progression of HEP (*including aquatic HEP) for improved strength, balance, gait. Baseline: interested in aquatic HEP for use of [facility] pool Goal status: IN PROGRESS 10/17/2023  2. Pt will improve 5x sit<>stand to less than or equal to 15 sec to demonstrate improved functional strength and transfer efficiency.  Baseline:  20 sec  Goal status:  INITIAL, 10/17/2023  3.  Pt will improve TUG score to less than or equal to 25 sec for decreased fall risk.. Baseline: 39 sec Goal status: INITIAL 10/17/23  4.  Pt  will verbalize plans for continued community fitness upon d/c from PT to maximize gains in PT. Baseline:  educated on this today, pt reports that she has a [employer] 09/19/23 Goal status: IN PROGRESS 10/17/23  5.  Pt will improve gait velocity to at least 1.8 ft/sec for improved gait efficiency and safety.  Baseline:  1.19 ft/sec  Goal status:  INITIAL, 10/17/2023      ASSESSMENT:  CLINICAL IMPRESSION: Pt presents back to pool today following brief break.  She reports she was sick and was then waiting for new approval.  She did great today with emphasis on LE strengthening and balance along with calf stretching to help ankle. Discussed that PT would have HEP at next session so that she may go to community pool.   OBJECTIVE IMPAIRMENTS: Abnormal gait, decreased balance, decreased mobility, difficulty walking, decreased ROM, decreased strength, impaired flexibility, postural dysfunction, and pain.   ACTIVITY LIMITATIONS: lifting, sitting, standing, stairs, transfers, and locomotion level  PARTICIPATION LIMITATIONS: community activity and exercise  PERSONAL FACTORS: 3+ comorbidities: see above are also affecting patient's functional outcome.   REHAB POTENTIAL: Good  CLINICAL DECISION MAKING: Evolving/moderate complexity  EVALUATION COMPLEXITY: Moderate  PLAN:  PT FREQUENCY: 1x/week  PT DURATION: 6 weeks per recert 10/17/2023   PLANNED INTERVENTIONS: 97110-Therapeutic exercises, 97530- Therapeutic activity, 97112- Neuromuscular re-education, 97535- Self Care, 16109- Manual therapy, [id number]- Gait training, [phone]- Aquatic Therapy, [phone]- Electrical stimulation (unattended), 97035- Ultrasound, Patient/Family education, Balance training, DME instructions, and Moist heat  PLAN FOR NEXT SESSION:  Continue balance, strengthening, ankle flexibility exercises (consider adding to HEP).  AQUATICS: Frequency: 1x/wk aquatics and 1x/wk in clinic (alternating weeks) Duration: 1-2  weeks *10/18/2023*Special Instruction: flexibility, strength, balance work; Physiological scientist up aquatic HEP that patient can use in pool at [employer] upon d/c.   Terri Fester, PT, MPT [facility] [address] [place], [place], [address] Phone: [phone]   Fax:  [phone] 10/24/23, 12:47 PM

## 2023-10-30 ENCOUNTER — Encounter: Payer: Self-pay | Admitting: Hematology

## 2023-10-31 ENCOUNTER — Ambulatory Visit: Payer: Self-pay

## 2023-10-31 NOTE — Telephone Encounter (Signed)
 Copied from CRM [id number]. Topic: Clinical - Red Word Triage >> Oct 31, 2023  9:45 AM Tisa Forester wrote: Red Word that prompted transfer to Nurse Triage: have a ear infection with green pus coming out of her ear with pain rating 6 and 8 , with sore throat , when put like a Q tip in the ear there is green pus  started with earach in left ear   Patient call back number [phone]  Chief Complaint: Ear pain and discharge Symptoms: Green drainage, sore throat and tongue Frequency: Since Thursday/Friday Pertinent Negatives: Patient denies fever Disposition: [] ED /[] Urgent Care (no appt availability in office) / [x] Appointment(In office/virtual)/ []  [place] [facility]/ [] Home Care/ [] Refused Recommended Disposition /[] [place] [facility]/ []  Follow-up with PCP Additional Notes: Patient called in to report left ear pain and drainage. Patient stated symptoms started Thursday/Friday of last week. Patient stated discharge is thick and green. Patient stated she had water therapy last week and believes she has swimmer's ear. Patient denied fever. Patient stated she is out of town and the earliest she could come in would be tomorrow afternoon. No availability with PCP tomorrow afternoon. This RN scheduled patient with alternate provider in office tomorrow afternoon. Provided care advice and instructed patient to call back if symptoms worsen. Patient complied.   Reason for Disposition  White, yellow, or green discharge  Answer Assessment - Initial Assessment Questions 1. LOCATION: "Which ear is involved?"     Left ear 2. ONSET: "When did the ear start hurting"      Thursday or Friday, noticed drainage on Sunday 3. SEVERITY: "How bad is the pain?"  (Scale 1-10; mild, moderate or severe)   - MILD (1-3): doesn't interfere with normal activities    - MODERATE (4-7): interferes with normal activities or awakens from sleep    - SEVERE (8-10): excruciating pain, unable to do any normal activities       Rates pain 6-8 at this time 4. URI SYMPTOMS: "Do you have a runny nose or cough?"     Sore throat and tongue, denies cough 5. FEVER: "Do you have a fever?" If Yes, ask: "What is your temperature, how was it measured, and when did it start?"     Denies 6. CAUSE: "Have you been swimming recently?", "How often do you use Q-TIPS?", "Have you had any recent air travel or scuba diving?"     States she thinks it might be swimmer's ear- states she had water therapy last Tuesday  7. OTHER SYMPTOMS: "Do you have any other symptoms?" (e.g., headache, stiff neck, dizziness, vomiting, runny nose, decreased hearing)     States she has been using OTC earache drops, thick green drainage  Protocols used: Louie Rover

## 2023-10-31 NOTE — Telephone Encounter (Signed)
 Message routed to Dr.V as Arlie Lain.

## 2023-10-31 NOTE — Patient Outreach (Signed)
 Complex Care Management   Visit Note  10/20/2023  Name:  Brandy [name]  MRN: [id number] DOB: [birth year]  Situation: Referral received for Complex Care Management related to Mental/Behavioral Health diagnosis Depression and Anxiety I obtained verbal consent from Patient.  Visit completed with pt  on the phone  Background:   Past Medical History:  Diagnosis Date   Acute pansinusitis 08/02/2017   Anxiety    Arthritis    ASD (atrial septal defect)    s/p closure with Amplatzer device 10/05/04 (Dr. 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 [place], as defined in [place]     Lumbar herniated disc    Migraines    On home oxygen  therapy 06/15/2022   Pt was given O2 on D/C from [facility] 04/2022   OSA on CPAP 06/15/2022   PONV (postoperative nausea and vomiting)    Sciatica     Assessment: Patient Reported Symptoms:  Cognitive Cognitive Status: Normal speech and language skills, Alert and oriented to person, place, and time Cognitive/Intellectual Conditions Management [RPT]: None reported or documented in medical history or problem list      Neurological Neurological Review of Symptoms: No symptoms reported    HEENT HEENT Symptoms Reported: No symptoms reported      Cardiovascular Cardiovascular Symptoms Reported: No symptoms reported Does patient have uncontrolled Hypertension?: No    Respiratory Respiratory Symptoms Reported: No symptoms reported Respiratory Conditions: COPD  Endocrine Patient reports the following symptoms related to hypoglycemia or hyperglycemia : No symptoms reported Is patient diabetic?: Yes Endocrine Conditions: Diabetes Endocrine Management Strategies: Medication therapy, Routine screening   Gastrointestinal Gastrointestinal Symptoms Reported: No symptoms reported      Genitourinary Genitourinary Symptoms Reported: No symptoms reported    Integumentary Integumentary Symptoms Reported: No symptoms reported    Musculoskeletal Musculoskelatal Symptoms Reviewed: Weakness Additional Musculoskeletal Details: DDD, Sciatica, Hernated Disc Musculoskeletal Conditions: Back pain Musculoskeletal Management Strategies: Medication therapy, Routine screening Musculoskeletal Self-Management Outcome: 2 (bad) Falls in the past year?: Yes Number of falls in past year: 2 or more Was there an injury with Fall?: Yes Fall Risk Category Calculator: 3 Patient Fall Risk Level: High Fall Risk Patient at Risk for Falls Due to: Impaired balance/gait, Impaired vision Fall risk Follow up: Falls prevention discussed  Psychosocial Psychosocial Symptoms Reported: No symptoms reported Additional Psychological Details: Pt endorses stress completing ADLs, such as, cleaning, preparing meals, and laundry. She is participating in PT and Water Therapy to increase activity. Pt reports strong support from adult children Behavioral Health Conditions: Anxiety, Depression Behavioral Management Strategies: Coping strategies, Support system, Medication therapy Major Change/Loss/Stressor/Fears (CP): Medical condition, self Techniques to Cope with Loss/Stress/Change: Exercise, Medication Quality of Family Relationships: helpful, involved, supportive Do you feel physically threatened by others?: No      10/18/2023   10:24 AM  Depression screen PHQ 2/9  Decreased Interest 1  Down, Depressed, Hopeless 1  PHQ - 2 Score 2  Altered sleeping 0  Tired, decreased energy 2  Change in appetite 0  Feeling bad or failure about yourself  0  Trouble concentrating 0  Moving slowly or fidgety/restless 0  Suicidal thoughts 0  PHQ-9 Score  4  Difficult doing work/chores Not difficult at all    There were no vitals filed for  this visit.  Medications Reviewed Today     Reviewed by Adriana Albany, LCSW (Social Worker) on 10/20/23 at 1131  Med List Status: <None>   Medication Order Taking? Sig Documenting Provider Last Dose Status Informant  acetaminophen  (TYLENOL ) 500 MG tablet [id number] Yes Take 500 mg by mouth daily as needed for moderate pain. [provider] Taking Active Self  albuterol  (VENTOLIN  HFA) 108 (90 Base) MCG/ACT inhaler [id number] Yes Inhale 2 puffs into the lungs every 6 (six) hours as needed for wheezing or shortness of breath. Quillian Brunt, MD Taking Active Self  ALPRAZolam  (XANAX ) 1 MG tablet [id number] Yes TAKE 1 TABLET BY MOUTH AT  BEDTIME AS NEEDED FOR ANXIETY Ngetich, Dinah C, NP Taking Active   amitriptyline  (ELAVIL ) 75 MG tablet [id number] Yes Take 1 tablet (75 mg total) by mouth at bedtime. Ngetich, Dinah C, NP Taking Active   Buprenorphine  HCl (BELBUCA ) 300 MCG FILM [id number] Yes Place 300 mcg inside cheek 2 (two) times daily. Patel, Seema K, NP Taking Active   Carboxymethylcellul-Glycerin (LUBRICATING EYE DROPS OP) [id number] Yes Place 1 drop into the right eye in the morning, at noon, and at bedtime. [provider] Taking Active Self  cyanocobalamin  (VITAMIN B12) 500 MCG tablet [id number] Yes Take 1 tablet (500 mcg total) by mouth daily. Leona Rake, MD Taking Active   Evolocumab  (REPATHA  SURECLICK) 140 MG/ML [place] [id number] Yes Inject 140 mg into the skin every 14 (fourteen) days. Tobb, Kardie, DO Taking Active   fluticasone -salmeterol (ADVAIR) 250-50 MCG/ACT AEPB [id number] Yes Inhale 1 puff into the lungs in the morning and at bedtime. Quillian Brunt, MD Taking Active   ketoconazole (NIZORAL) 2 % cream [id number] Yes Apply 1 Application topically 2 (two) times daily. [provider] Taking Active   loperamide  (IMODIUM ) 2 MG capsule [id number] Yes Take 1-2 capsules (2-4 mg total) by mouth 4 (four) times daily as needed for diarrhea or loose stools. Frankie Israel, MD Taking Active Self  magnesium  30 MG tablet [id number] Yes Take 30 mg by mouth 2 (two) times daily. [provider] Taking Active   methocarbamol  (ROBAXIN ) 500 MG tablet [id number] Yes Take 1 tablet (500 mg total) by mouth every 6 (six) hours as needed for muscle spasms. Nitka, James E, MD Taking Active Self  midodrine  (PROAMATINE ) 10 MG tablet [id number] Yes TAKE 1 TABLET BY MOUTH IN THE  MORNING AND AT BEDTIME Ngetich, Dinah C, NP Taking Active   Multiple Vitamin (MULTIVITAMIN WITH MINERALS) TABS tablet [id number] Yes Take 1 tablet by mouth daily. [provider] Taking Active Self           Med Note Mills Alma, Sahiba Granholm E   Fri May 28, 2021  9:32 AM)    omeprazole  (PRILOSEC  OTC) 20 MG tablet [id number] Yes Take 20 mg by mouth daily. [provider] Taking Active Self  PRESCRIPTION MEDICATION [id number] Yes CPAP- At bedtime [provider] Taking Active Self  triamcinolone  cream (KENALOG ) 0.1 % [id number] Yes Apply 1 Application topically 2 (two) times daily. Medina-Vargas, Monina C, NP Taking Active Self  VERZENIO  100 MG tablet [id number] Yes TAKE 1 TABLET BY MOUTH TWICE DAILY Salomon Cree, Gautam Kishore, MD Taking Active   Vitamin D , Ergocalciferol , (DRISDOL ) 1.25 MG (50000 UNIT) CAPS capsule [id number] Yes TAKE 1 CAPSULE BY MOUTH EVERY 7  DAYS ( MONDAY ) Ngetich, Dinah C, NP Taking Active   Med  List Note Ahnesti, Townsend, RN 09/20/23 1555): UDS 09/12/23 MR Hydrocodone  10/12/23; [place]  10/22/23   PA for Hydro/APAP sent via CoverMyMeds 09/18/23 (Key: [id number]) . ER --> Denied on 09/20/23. --> Fax sent to Appeal Dept on 09/20/23. ER (FYI: Cover my meds has patient under name First name: E E; Last name: Renford Cartwright [name]  with her old address [address] [place], [place] [address]) 09/18/23            Recommendation:   Continue Current Plan of Care  Follow Up Plan:   Telephone follow-up 2-4 weeks  Alease Hunter, LCSW [place]  [facility], [facility] Clinical Social Worker Direct Dial: [phone]  Fax: [phone] Website: [email or web address] 10:05 AM

## 2023-10-31 NOTE — Patient Instructions (Signed)
 Visit Information  Thank you for taking time to visit with me today. Please don't hesitate to contact me if I can be of assistance to you before our next scheduled appointment.  Our next appointment is by telephone on 06/06 at 3:30 PM Please call the care guide team at [phone] if you need to cancel or reschedule your appointment.   Following is a copy of your care plan:   Goals Addressed             This Visit's Progress    LCSW VBCI Social Work Care Plan   On track    Problems:   Disease Management support and education needs related to Depression: anxiety and depressed mood and Grief  CSW Clinical Goal(s):   Over the next 90 days the Patient will attend all scheduled medical appointments as evidenced by patient report and care team review of appointment completion in electronic MEDICAL RECORD NUMBER  demonstrate a reduction in symptoms related to Depression: anxiety depressed mood Grief .  Interventions:  Mental Health:  Evaluation of current treatment plan related to Depression: anxiety and depressed mood and Grief Active listening / Reflection utilized Consideration on in-home help encouraged : options discussed Financial risk analyst / information provided Emotional Support Provided Mindfulness or Relaxation training provided Provided general psycho-education for mental health needs Reviewed mental health medications and discussed importance of compliance: Pt reports compliance Suicidal Ideation/Homicidal Ideation assessed: Pt denies  Patient Goals/Self-Care Activities:  Continue taking your medication as prescribed.   Coordinate with [facility] to assist with supportive resources. Increase coping skills and healthy habits  Plan:   Telephone follow up appointment with care management team member scheduled for:  2-4 weeks        Please call the Suicide and Crisis Lifeline: 988 go to [facility] Urgent [facility] [address], [place] [phone]) call 911 if you are experiencing a Mental Health or Behavioral Health Crisis or need someone to talk to.  Patient verbalizes understanding of instructions and care plan provided today and agrees to view in MyChart. Active MyChart status and patient understanding of how to access instructions and care plan via MyChart confirmed with patient.     [facility]  [facility] Clinical Social Worker Direct Dial: [phone]  Fax: [phone] Website: [email or web address] 10:05 AM

## 2023-11-01 ENCOUNTER — Ambulatory Visit (INDEPENDENT_AMBULATORY_CARE_PROVIDER_SITE_OTHER): Admitting: Sports Medicine

## 2023-11-01 ENCOUNTER — Encounter: Payer: Self-pay | Admitting: Sports Medicine

## 2023-11-01 VITALS — BP 160/86 | HR 85 | Temp 97.8°F | Wt 204.6 lb

## 2023-11-01 DIAGNOSIS — R6889 Other general symptoms and signs: Secondary | ICD-10-CM

## 2023-11-01 DIAGNOSIS — J029 Acute pharyngitis, unspecified: Secondary | ICD-10-CM

## 2023-11-01 DIAGNOSIS — B309 Viral conjunctivitis, unspecified: Secondary | ICD-10-CM | POA: Diagnosis not present

## 2023-11-01 DIAGNOSIS — I10 Essential (primary) hypertension: Secondary | ICD-10-CM | POA: Diagnosis not present

## 2023-11-01 DIAGNOSIS — H6690 Otitis media, unspecified, unspecified ear: Secondary | ICD-10-CM

## 2023-11-01 LAB — POCT INFLUENZA A/B
Influenza A, POC: NEGATIVE
Influenza B, POC: NEGATIVE

## 2023-11-01 LAB — POC COVID19 BINAXNOW: SARS Coronavirus 2 Ag: NEGATIVE

## 2023-11-01 MED ORDER — AMOXICILLIN-POT CLAVULANATE 875-125 MG PO TABS: 1.0000 | ORAL_TABLET | Freq: Two times a day (BID) | ORAL | 0 refills | Status: DC

## 2023-11-01 NOTE — Progress Notes (Unsigned)
 Careteam: Patient Care Team: Ngetich, Elijio Guadeloupe, NP as PCP - General (Family Medicine) Tobb, Kardie, DO as PCP - Cardiology (Cardiology) Verlene Glimpse, NP as Nurse Practitioner Adriana Albany, LCSW as VBCI Care Management (Licensed Clinical Social Worker)  PLACE OF SERVICE:  [facility]  Advanced Directive information    Allergies  Allergen Reactions   Zithromax [Azithromycin] Shortness Of Breath, Itching and Other (See Comments)    TOTAL BODY ITCHING [EVEN SOLES OF FEET] WHEEZING    Tramadol  Itching and Other (See Comments)    Has taken recently without any side effects.   Psyllium Nausea And Vomiting and Other (See Comments)    Metamucil. Sneezing      Chief Complaint  Patient presents with   Ear Drainage     Discussed the use of AI scribe software for clinical note transcription with the patient, who gave verbal consent to proceed.  History of Present Illness    Brandy [name]  is a 58 year old female who presents with left ear pain and discharge.  She began experiencing left ear pain last Friday. Initially, she used over-the-counter ear drops for relief. By Sunday, the pain intensified, and she felt a popping sensation followed by drainage. She describes a sensation of water fullness in her left ear and noted thick, green discharge. She continued using ear drops to keep the area moist as it crusted over.  She has a history of cleft lip and palate, which she believes increases her susceptibility to ear issues. She attended aqua therapy last Tuesday and suspects water may have entered her ear during the session.  No fever is present, but she reports a sore throat coinciding with the ear pain, making swallowing difficult. She has been using salt water for relief. No nasal congestion, chest pain, cough, or gastrointestinal or urinary symptoms.  She mentions dizziness, which has been exacerbated by the ear pain and drainage. She is currently undergoing  physical therapy for balance issues.  Review of Systems:  Review of Systems  Constitutional:  Negative for chills and fever.  HENT:  Positive for ear discharge and ear pain. Negative for congestion and sore throat.   Eyes:  Negative for double vision.  Respiratory:  Negative for cough, sputum production and shortness of breath.   Cardiovascular:  Negative for chest pain, palpitations and leg swelling.  Gastrointestinal:  Negative for abdominal pain, heartburn and nausea.  Genitourinary:  Negative for dysuria, frequency and hematuria.  Musculoskeletal:  Negative for falls and myalgias.  Neurological:  Negative for dizziness, sensory change and focal weakness.   Negative unless indicated in HPI.   Past Medical History:  Diagnosis Date   Acute pansinusitis 08/02/2017   Anxiety    Arthritis    ASD (atrial septal defect)    s/p closure with Amplatzer device 10/05/04 (Dr. Kyla Phlegm, [facility]) 10/05/04   Cancer [facility])    Cataract    Chronic pain    CKD (chronic kidney disease)    Dyspnea    Fatty liver 09/04/2019   GERD (gastroesophageal reflux disease)    Heart murmur    no longer heard   History of hiatal hernia    Hyperlipidemia    Legally blind in right [place], as defined in [place]     Lumbar herniated disc    Migraines    On home oxygen  therapy 06/15/2022   Pt was given O2 on D/C from [facility] 04/2022   OSA on CPAP 06/15/2022   PONV (postoperative  nausea and vomiting)    Sciatica    Past Surgical History:  Procedure Laterality Date   ABLATION     BREAST LUMPECTOMY WITH RADIOACTIVE SEED AND SENTINEL LYMPH NODE BIOPSY Right 11/23/2020   Procedure: RIGHT BREAST LUMPECTOMY WITH RADIOACTIVE SEED AND RIGHT AXILLARY SENTINEL LYMPH NODE BIOPSY;  Surgeon: Enid Harry, MD;  Location: [facility] OR;  Service: General;  Laterality: Right;   BREAST SURGERY Bilateral 2011   Breast Reduction Surgery   BUNIONECTOMY     CARDIAC CATHETERIZATION     10/05/04 [facility]): LM < 25%, otherwise normal  coronaries. No pulmonary HTN, Mildly enlarged RV. Secundum ASD s/p closure.   CARDIAC SURGERY     CATARACT EXTRACTION     CLEFT PALATE REPAIR     s/p cleft lip and palate repair   EYE SURGERY Right 2019   right eye removed   LUMBAR LAMINECTOMY/DECOMPRESSION MICRODISCECTOMY Left 05/23/2016   Procedure: LEFT L4-L5 LATERAL RECESS DECOMPRESSION WITH CENTRAL AND RIGHT DECOMPRESSION VIA LEFT SIDE;  Surgeon: Alphonso Jean, MD;  Location: [facility] OR;  Service: Orthopedics;  Laterality: Left;   LUMBAR LAMINECTOMY/DECOMPRESSION MICRODISCECTOMY Left 05/23/2016   Procedure: LUMBAR LAMINECTOMY/DECOMPRESSION MICRODISCECTOMY Lumbar five - Sacral One 1 LEVEL;  Surgeon: Alphonso Jean, MD;  Location: [facility] OR;  Service: Orthopedics;  Laterality: Left;   REDUCTION MAMMAPLASTY  2011   SHOULDER INJECTION Left 05/23/2016   Procedure: SHOULDER INJECTION;  Surgeon: Alphonso Jean, MD;  Location: [facility] OR;  Service: Orthopedics;  Laterality: Left;  band-aid per pa-c   TRANSTHORACIC ECHOCARDIOGRAM     12/15/05 [facility]): Mild LVH, EF > 55%, grade 1 diastolic dysfunction, Trivial MR/PR/TR.   TUBAL LIGATION     Social History:   reports that she quit smoking about 5 years ago. Her smoking use included cigarettes. She started smoking about 30 years ago. She has a 6.3 pack-year smoking history. She has never used smokeless tobacco. She reports that she does not drink alcohol  and does not use drugs.  Family History  Problem Relation Age of Onset   Arthritis Mother    Hypertension Mother    Diabetes Mother    High blood pressure Mother    Cancer Father        Lung   Colon cancer Neg Hx    Colon polyps Neg Hx    Esophageal cancer Neg Hx    Rectal cancer Neg Hx    Stomach cancer Neg Hx     Medications: Patient's Medications  New Prescriptions   No medications on file  Previous Medications   ABEMACICLIB  (VERZENIO ) 100 MG TABLET    Take 1 tablet (100 mg total) by mouth 2 (two) times daily.   ACETAMINOPHEN  (TYLENOL ) 500 MG  TABLET    Take 500 mg by mouth daily as needed for moderate pain.   ALBUTEROL  (VENTOLIN  HFA) 108 (90 BASE) MCG/ACT INHALER    Inhale 2 puffs into the lungs every 6 (six) hours as needed for wheezing or shortness of breath.   ALPRAZOLAM  (XANAX ) 1 MG TABLET    TAKE 1 TABLET BY MOUTH AT  BEDTIME AS NEEDED FOR ANXIETY   AMITRIPTYLINE  (ELAVIL ) 75 MG TABLET    Take 1 tablet (75 mg total) by mouth at bedtime.   CARBOXYMETHYLCELLUL-GLYCERIN (LUBRICATING EYE DROPS OP)    Place 1 drop into the right eye in the morning, at noon, and at bedtime.   CYANOCOBALAMIN  (VITAMIN B12) 500 MCG TABLET    Take 1 tablet (500 mcg total) by mouth daily.  EVOLOCUMAB  (REPATHA  SURECLICK) 140 MG/ML SOAJ    Inject 140 mg into the skin every 14 (fourteen) days.   FLUTICASONE -SALMETEROL (ADVAIR) 250-50 MCG/ACT AEPB    Inhale 1 puff into the lungs in the morning and at bedtime.   KETOCONAZOLE (NIZORAL) 2 % CREAM    Apply 1 Application topically 2 (two) times daily.   LOPERAMIDE  (IMODIUM ) 2 MG CAPSULE    Take 1-2 capsules (2-4 mg total) by mouth 4 (four) times daily as needed for diarrhea or loose stools.   MAGNESIUM  30 MG TABLET    Take 30 mg by mouth 2 (two) times daily.   METHOCARBAMOL  (ROBAXIN ) 500 MG TABLET    Take 1 tablet (500 mg total) by mouth every 6 (six) hours as needed for muscle spasms.   MIDODRINE  (PROAMATINE ) 10 MG TABLET    TAKE 1 TABLET BY MOUTH IN THE  MORNING AND AT BEDTIME   MULTIPLE VITAMIN (MULTIVITAMIN WITH MINERALS) TABS TABLET    Take 1 tablet by mouth daily.   OMEPRAZOLE  (PRILOSEC  OTC) 20 MG TABLET    Take 20 mg by mouth daily.   PRESCRIPTION MEDICATION    CPAP- At bedtime   TRIAMCINOLONE  CREAM (KENALOG ) 0.1 %    Apply 1 Application topically 2 (two) times daily.   VITAMIN D , ERGOCALCIFEROL , (DRISDOL ) 1.25 MG (50000 UNIT) CAPS CAPSULE    TAKE 1 CAPSULE BY MOUTH EVERY 7  DAYS ( MONDAY )  Modified Medications   No medications on file  Discontinued Medications   No medications on file    Physical  Exam: Vitals:   11/01/23 1521  BP: (!) 160/86  Pulse: 85  Temp: 97.8 F (36.6 C)  SpO2: 95%  Weight: 204 lb 9.6 oz (92.8 kg)   Body mass index is 32.04 kg/m. BP Readings from Last 3 Encounters:  11/01/23 (!) 160/86  10/23/23 (!) 125/59  10/18/23 122/78   Wt Readings from Last 3 Encounters:  11/01/23 204 lb 9.6 oz (92.8 kg)  10/23/23 204 lb 14.4 oz (92.9 kg)  10/18/23 207 lb (93.9 kg)    Physical Exam Constitutional:      Appearance: Normal appearance.  HENT:     Head: Normocephalic and atraumatic.     Ears:     Comments: Left ear canal filled with whitish discharge TM not visualized due to drainage  Cardiovascular:     Rate and Rhythm: Normal rate and regular rhythm.  Pulmonary:     Effort: Pulmonary effort is normal. No respiratory distress.     Breath sounds: Normal breath sounds. No wheezing.  Abdominal:     General: Bowel sounds are normal. There is no distension.     Tenderness: There is no abdominal tenderness. There is no guarding.     Comments:    Musculoskeletal:        General: No swelling.  Neurological:     Mental Status: She is alert. Mental status is at baseline.     Motor: No weakness.     Labs reviewed: Basic Metabolic Panel: Recent Labs    03/15/23 0927 04/03/23 1311 09/13/23 1012 09/25/23 1333 10/09/23 1108 10/23/23 1344  NA 141   < > 143 140 141 141  K 4.2   < > 3.9 4.4 4.2 4.1  CL 102   < > 102 103 100 105  CO2 30   < > 29 31 33* 29  GLUCOSE 84   < > 92 78 86 76  BUN 18   < > 11 18  12 23*  CREATININE 1.24*   < > 1.05* 1.24* 1.33* 1.51*  CALCIUM  9.4   < > 9.0 9.8 9.5 9.6  MG 1.6  --  1.1*  --   --   --   TSH 1.95  --  1.26  --   --   --    < > = values in this interval not displayed.   Liver Function Tests: Recent Labs    08/28/23 1352 09/13/23 1012 09/25/23 1333 10/23/23 1344  AST 90* 59*  59* 54* 69*  ALT 88* 51*  51* 44 54*  ALKPHOS 272*  --  241* 227*  BILITOT 0.5 0.7  0.7 0.8 0.9  PROT 7.0 6.7  6.7 7.4 7.6   ALBUMIN  3.6  --  3.9 4.0   No results for input(s): "LIPASE", "AMYLASE" in the last 8760 hours. No results for input(s): "AMMONIA" in the last 8760 hours. CBC: Recent Labs    09/25/23 1333 10/09/23 1108 10/23/23 1344  WBC 4.4 3.8 4.5  NEUTROABS 1.7 813* 1.6*  HGB 12.4 12.1 11.3*  HCT 37.0 36.1 33.9*  MCV 103.1* 102.6* 103.4*  PLT 266 199 204   Lipid Panel: Recent Labs    03/15/23 0927 05/30/23 1053 09/13/23 1012  CHOL 329* 356* 304*  HDL 91 122 83  LDLCALC 186* 189* 169*  TRIG 309* 243* 290*  CHOLHDL 3.6 2.9 3.7   TSH: Recent Labs    03/15/23 0927 09/13/23 1012  TSH 1.95 1.26   A1C: Lab Results  Component Value Date   HGBA1C 5.3 07/26/2022    Assessment and Plan Assessment & Plan   1. Acute otitis media, unspecified otitis media type Left ear canal filled with whitish fluid Wills tart augmentin   Will refer to ENT  - Ambulatory referral to ENT - amoxicillin -clavulanate (AUGMENTIN ) 875-125 MG tablet; Take 1 tablet by mouth 2 (two) times daily.  Dispense: 20 tablet; Refill: 0  2. Acute viral conjunctivitis of both eyes Watering from both eyes Instructed to use zaditor eye drops Monitor for yellowish drainage or crusting   3. Sorethroat Lu, covid neg  4. Primary hypertension Bp high Instructed to monitor bp , keep a log  Pt states that her bp usually runs low  5. Congestion of throat (Primary) Flu, covid neg

## 2023-11-02 ENCOUNTER — Encounter: Payer: Self-pay | Admitting: Physical Therapy

## 2023-11-02 ENCOUNTER — Ambulatory Visit: Admitting: Physical Therapy

## 2023-11-02 DIAGNOSIS — R2689 Other abnormalities of gait and mobility: Secondary | ICD-10-CM | POA: Diagnosis not present

## 2023-11-02 DIAGNOSIS — M6281 Muscle weakness (generalized): Secondary | ICD-10-CM | POA: Diagnosis not present

## 2023-11-02 DIAGNOSIS — R2681 Unsteadiness on feet: Secondary | ICD-10-CM | POA: Diagnosis not present

## 2023-11-02 NOTE — Therapy (Signed)
 OUTPATIENT PHYSICAL THERAPY NEURO NOTE  Patient Name: Brandy [name]  MRN: [id number] DOB:30-Apr-1966, 58 y.o., female Today's Date: 11/02/2023   PCP: Estil Heman, NP  REFERRING PROVIDER: Estil Heman, NP     END OF SESSION:  PT End of Session - 11/02/23 0939     Visit Number 15    Number of Visits 19    Date for PT Re-Evaluation 12/01/23   per recert 10/17/2023   Authorization Type [employer] Medicare-submitted for reauth after 10/17/2023 visit    Authorization Time Period approved 6 visits from 10/17/23-11/28/23   new auth submitted   Authorization - Visit Number 3    Authorization - Number of Visits 6    PT Start Time [phone]    PT Stop Time 1015    PT Time Calculation (min) 39 min    Equipment Utilized During Treatment Gait belt    Activity Tolerance Patient tolerated treatment well    Behavior During Therapy [facility] for tasks assessed/performed                       Past Medical History:  Diagnosis Date   Acute pansinusitis 08/02/2017   Anxiety    Arthritis    ASD (atrial septal defect)    s/p closure with Amplatzer device 10/05/04 (Dr. Kyla Phlegm, [facility]) 10/05/04   Cancer [facility])    Cataract    Chronic pain    CKD (chronic kidney disease)    Dyspnea    Fatty liver 09/04/2019   GERD (gastroesophageal reflux disease)    Heart murmur    no longer heard   History of hiatal hernia    Hyperlipidemia    Legally blind in right eye, as defined in [place]     Lumbar herniated disc    Migraines    On home oxygen  therapy 06/15/2022   Pt was given O2 on D/C from [facility] 04/2022   OSA on CPAP 06/15/2022   PONV (postoperative nausea and vomiting)    Sciatica    Past Surgical History:  Procedure Laterality Date   ABLATION     BREAST LUMPECTOMY WITH RADIOACTIVE SEED AND SENTINEL LYMPH NODE BIOPSY Right 11/23/2020   Procedure: RIGHT BREAST LUMPECTOMY WITH RADIOACTIVE SEED AND RIGHT AXILLARY SENTINEL LYMPH NODE BIOPSY;  Surgeon: Enid Harry, MD;   Location: [facility] OR;  Service: General;  Laterality: Right;   BREAST SURGERY Bilateral 2011   Breast Reduction Surgery   BUNIONECTOMY     CARDIAC CATHETERIZATION     10/05/04 [facility]): LM < 25%, otherwise normal coronaries. No pulmonary HTN, Mildly enlarged RV. Secundum ASD s/p closure.   CARDIAC SURGERY     CATARACT EXTRACTION     CLEFT PALATE REPAIR     s/p cleft lip and palate repair   EYE SURGERY Right 2019   right eye removed   LUMBAR LAMINECTOMY/DECOMPRESSION MICRODISCECTOMY Left 05/23/2016   Procedure: LEFT L4-L5 LATERAL RECESS DECOMPRESSION WITH CENTRAL AND RIGHT DECOMPRESSION VIA LEFT SIDE;  Surgeon: Alphonso Jean, MD;  Location: [facility] OR;  Service: Orthopedics;  Laterality: Left;   LUMBAR LAMINECTOMY/DECOMPRESSION MICRODISCECTOMY Left 05/23/2016   Procedure: LUMBAR LAMINECTOMY/DECOMPRESSION MICRODISCECTOMY Lumbar five - Sacral One 1 LEVEL;  Surgeon: Alphonso Jean, MD;  Location: [facility] OR;  Service: Orthopedics;  Laterality: Left;   REDUCTION MAMMAPLASTY  2011   SHOULDER INJECTION Left 05/23/2016   Procedure: SHOULDER INJECTION;  Surgeon: Alphonso Jean, MD;  Location[facility] OR;  Service: Orthopedics;  Laterality: Left;  band-aid per  pa-c   TRANSTHORACIC ECHOCARDIOGRAM     12/15/05 [facility]): Mild LVH, EF > 55%, grade 1 diastolic dysfunction, Trivial MR/PR/TR.   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 (HCC) 09/13/2023   Chronic obstructive pulmonary disease, unspecified COPD type (HCC) 03/15/2023   Major depressive disorder, single episode, severe without psychotic features (HCC) 03/15/2023   Orthostatic hypotension 04/24/2022   Hyperbilirubinemia 04/24/2022   Snoring 07/14/2021   Fatigue 07/14/2021   Daytime somnolence 07/14/2021   Status post device closure of ASD 06/03/2021   Secundum ASD 06/03/2021   Decreased diffusion  capacity 05/14/2021   Dyspnea 03/04/2021   Pulmonary nodules 04/01/2020   Primary malignant neoplasm of breast with metastasis (HCC) 02/24/2020   Spondylosis without myelopathy or radiculopathy, lumbosacral region 12/31/2019   Chronic low back pain (Bilateral) w/o sciatica 12/31/2019   Chronic hip pain (3ry area of Pain) (Bilateral) (R>L) 11/27/2019   Chronic sacroiliac joint pain (Left) 11/27/2019   Chronic pain syndrome 11/11/2019   Encounter for chronic pain management 11/11/2019   Disorder of skeletal system 11/11/2019   Problems influencing health status 11/11/2019   Abnormal MRI, lumbar spine (08/22/2018) 11/11/2019   Chronic low back pain (1ry area of Pain) (Bilateral) w/ sciatica (Bilateral) 11/11/2019   DDD (degenerative disc disease), lumbosacral 11/11/2019   Grade 1 Anterolisthesis (L2-3, L3-4) 11/11/2019   Grade 1 Retrolisthesis (L4-5, L5-S1) 11/11/2019   Lumbar facet hypertrophy (Multilevel) (Bilateral) 11/11/2019   Lumbar facet syndrome (Multilevel) (Bilateral) (R>L) 11/11/2019   Chronic lower extremity pain (2ry area of Pain) (Bilateral) (R>L) 11/11/2019   Neurogenic pain 11/11/2019   Chronic musculoskeletal pain 11/11/2019   Infiltrating ductal carcinoma of breast (HCC) 10/08/2019   Fatty liver 09/04/2019   Obesity (BMI 30-39.9) 09/04/2019   GERD (gastroesophageal reflux disease)    Hyperlipidemia    CKD (chronic kidney disease), stage III (HCC)    Depression    Malignant hypertension    Ocular proptosis 02/14/2019   PVD (posterior vitreous detachment), left eye 02/14/2019   Chorioretinal scar of left eye 02/14/2019   Vitreous floaters of left eye 02/14/2019   Bilateral leg weakness 01/24/2019   Insomnia 08/02/2017   Low back pain radiating to both legs 08/02/2017   Migraine 08/02/2017   Neuropathy 08/02/2017   Spinal stenosis, 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    Class: Chronic   Left shoulder tendonitis 05/23/2016    Class: Acute   Post  laminectomy syndrome 05/23/2016   Anxiety disorder 01/07/2015    ONSET DATE: 07/20/2023 (MD referral)  REFERRING DIAG:  M54.16 (ICD-10-CM) - Lumbar back pain with radiculopathy affecting left lower extremity  R26.81 (ICD-10-CM) - Unsteady gait  R29.6 (ICD-10-CM) - Frequent falls    THERAPY DIAG:  Unsteadiness on feet  Muscle weakness (generalized)  Other abnormalities of gait and mobility  Rationale for Evaluation and Treatment: Rehabilitation  SUBJECTIVE:  SUBJECTIVE STATEMENT: Had to see MD yesterday for ear infection and started antibiotics.  Had a fall yesterday, tripped over stool helping my sister.    Pt accompanied by: self  PERTINENT HISTORY: Per MD:  She has been experiencing worsening back pain following a fall last month during a snowstorm. She slipped on ice and fell, initially without pain, but began experiencing consistent pain about a week later. The pain is primarily located in the lower back, radiating more to the left side, with tenderness in the buttocks and back of the thigh. On February 9th, she experienced severe pain radiating from her left foot up her leg, causing her to fall back into bed. No current pain radiating down the leg, weakness, tingling or numbness. No swelling or bruising in the affected areas. She has a history of back pain and underwent a nerve ablation procedure at the end of January to manage her symptoms.  PMH:  breast cancer with metastasis;  Lumbar laminectomy/decompression microdiscectomy (Left, 05/23/2016); Lumbar laminectomy/decompression microdiscectomy (Left, 05/23/2016); Shoulder injection (Left, 05/23/2016); Reduction mammaplasty (2011); Eye surgery (Right, 2019); Tubal ligation; and Breast lumpectomy with radioactive seed and sentinel lymph node biopsy  (Right, 11/23/2020).   PAIN:  Are you having pain? Yes: NPRS scale: 3-4/10 Pain location: L ankle, R hip Pain description: sore Aggravating factors: not sure Relieving factors: not sure  PRECAUTIONS: Fall; decreased vision Per MD note:  She has issues with her eyes, specifically dry eyes, requiring the use of artificial tears every hour. She has a prosthetic eye (R) and uses lubricant and tapes her left eyelid shut at night to prevent dryness. Her vision can be distorted at times, affecting her daily activities.   RED FLAGS: None   WEIGHT BEARING RESTRICTIONS: No  FALLS: Has patient fallen in last 6 months? Yes. Number of falls at least 3 falls  LIVING ENVIRONMENT: Lives with: lives with their spouse Lives in: House/apartment Stairs: 6 steps, multiple steps -getting ready to move into apartment where there are no steps Has following equipment at home: Single point cane and Walker - 4 wheeled  PLOF: Independent with household mobility with device and Independent with community mobility with device  PATIENT GOALS: Pt's goals for therapy become painfree, more independent, exercise more; 10/17/2023:  better balance  OBJECTIVE:    TODAY'S TREATMENT: 11/02/2023 Activity Comments  Sit to stand, 2 x 5 reps From bed height  Performed alternating step taps at 4" step, x 10 UE support  Sidestepping along counter, 3 reps R and L UE support and supervision-discussed counting steps so she knows she is near end of counter for safety  Quarter turns R and L, 3 reps Cues to perform slowly and pay attention to surroundings  Gait x 60 ft in and out of session with SPC, supervision Pt holds on to furniture with L hand      HOME EXERCISE PROGRAM Access Code: [email or web address] URL: [email or web address] Date: 11/02/2023 Prepared by: [facility] - Outpatient  Rehab - [facility]  Exercises - Supine Posterior Pelvic Tilt  - 1 x daily - 7 x weekly - 1-2 sets - 10 reps - Supine Hamstring  Stretch with Strap  - 1 x daily - 5 x weekly - 2 sets - 30 sec hold - Supine Lower Trunk Rotation  - 1 x daily - 5 x weekly - 2 sets - 20 reps - Seated Anterior Pelvic Tilt  - 1 x daily - 7 x weekly - 1-2 sets - 10 reps - Wide  Tandem Stance with Eyes Open  - 1 x daily - 5 x weekly - 2 sets - 30 sec hold - Standing Toe Taps  - 1 x daily - 5 x weekly - 2 sets - 10 reps - Standing Shoulder Row with Anchored Resistance  - 1 x daily - 5 x weekly - 2 sets - 10 reps - Standing Anti-Rotation Press with Anchored Resistance  - 1 x daily - 7 x weekly - 3 sets - 10 reps - 2 sec hold - Hooklying Isometric Hip Flexion with Opposite Arm  - 1 x daily - 7 x weekly - 3 sets - 10 reps - 2 sec hold - Side Stepping with Counter Support  - 1 x daily - 7 x weekly - 1 sets - 3-5 reps - Standing Quarter Turn with Counter Support  - 1 x daily - 7 x weekly - 1 sets - 3-5 reps   PATIENT EDUCATION: Education details: Modified HEP, discussed options for asking social work [facility] [employer]) about household resources.  Discussed optimal safety with gait is to use walker at all times Person educated: Patient Education method: Explanation, Demonstration, and emailed additions to [employer] Education comprehension: verbalized understanding, returned demonstration, and needs further education        ---------------------------------------------  Note: Objective measures were completed at Evaluation unless otherwise noted.  DIAGNOSTIC FINDINGS: x-rays to be completed 07/26/2023  COGNITION: Overall cognitive status: Within functional limits for tasks assessed   SENSATION: No reports of numbness   POSTURE: rounded shoulders, forward head, and posterior pelvic tilt  LOWER EXTREMITY ROM:     Active  Right Eval Left Eval  Hip flexion    Hip extension    Hip abduction    Hip adduction    Hip internal rotation    Hip external rotation    Knee flexion    Knee extension    Ankle dorsiflexion 5 5  Ankle  plantarflexion    Ankle inversion    Ankle eversion     (Blank rows = not tested)  LOWER EXTREMITY MMT:    MMT Right Eval Left Eval  Hip flexion 4+ 4+  Hip extension    Hip abduction 4 4  Hip adduction 4 4  Hip internal rotation    Hip external rotation    Knee flexion 4 4  Knee extension 4+ 4+  Ankle dorsiflexion 4 4  Ankle plantarflexion    Ankle inversion    Ankle eversion    (Blank rows = not tested)  LUMBAR: Palpation:  Tender to palpation along paraspinals thoracic spine, SI joint area, bilateral buttocks/gluts/piriformis; pt tender to palpation along IT band R>L Baseline pain in standing:  0/10 Repeated standing flexion: 3/10 pain, into bilat buttocks Repeated standing extension:1-2/10 pain centralized at mid- low back Limited in lumbar extension flexibility  Hamstring flexibility from supine 90/90:  R  -15 degrees; L -20 degrees SLR test from supine:  R 85 degrees, L 80 degrees  TRANSFERS: Assistive device utilized: None BUE support Sit to stand: Modified independence Stand to sit: Modified independence   GAIT: Gait pattern: step through pattern, antalgic, lateral lean- Right, and lateral lean- Left Distance walked: 40-50 ft Assistive device utilized: Single point cane Level of assistance: Modified independence Comments: Has rollator at home, reports less pain with gait when using rollator   PATIENT SURVEYS:  Modified Oswestry (Back Index):  48  GOALS: Goals reviewed with patient? Yes  SHORT TERM GOALS: Target date: 08/25/2023  Pt will be independent with HEP for improved strength, flexibility, balance, gait. Baseline: Pt denies questions on HEP 08/23/23 Goal status: MET  08/23/23  2.  Pt will report low back pain reduced by 50% during functional daily activities. Baseline:  Reports LBP is "touch and go." Reports that is it a  "teeny weeny bit better" 08/24/23  Goal status: IN PROGRESS 08/24/23   3. Patient to score at least 46/56 on Berg in order to decrease risk of falls.  Baseline: 42 08/24/23; 52/56 09/19/23 Goal status:MET 09/19/23  4.  Pt will verbalize understanding of fall prevention and posture/body mechanics for decreased pain/decreased fall risk.  Baseline: provided today 08/23/23  Goal status: MET 08/23/23   LONG TERM GOALS: UPDATED TARGET 12/01/2023  Pt will be independent with progression of HEP (*including aquatic HEP) for improved strength, balance, gait. Baseline: interested in aquatic HEP for use of [facility] pool Goal status: IN PROGRESS 10/17/2023  2. Pt will improve 5x sit<>stand to less than or equal to 15 sec to demonstrate improved functional strength and transfer efficiency.  Baseline:  20 sec  Goal status:  INITIAL, 10/17/2023  3.  Pt will improve TUG score to less than or equal to 25 sec for decreased fall risk.. Baseline: 39 sec Goal status: INITIAL 10/17/23  4.  Pt will verbalize plans for continued community fitness upon d/c from PT to maximize gains in PT. Baseline:  educated on this today, pt reports that she has a [employer] 09/19/23 Goal status: IN PROGRESS 10/17/23  5.  Pt will improve gait velocity to at least 1.8 ft/sec for improved gait efficiency and safety.  Baseline:  1.19 ft/sec  Goal status:  INITIAL, 10/17/2023      ASSESSMENT:  CLINICAL IMPRESSION: Pt presents today with reports of recent fall and continued concerns about balance. Skilled PT session focused on updated HEP for balance.  Pt needs UE support and cues for slowed pace.  Also discussed options for assistance within the home, like an aide service and she voices plans to discuss with social worker.  Also, discussed that rollator walker is safest option for patient at all times.  Pt will continue to benefit from skilled PT towards goals for improved functional mobility and decreased fall risk.  OBJECTIVE  IMPAIRMENTS: Abnormal gait, decreased balance, decreased mobility, difficulty walking, decreased ROM, decreased strength, impaired flexibility, postural dysfunction, and pain.   ACTIVITY LIMITATIONS: lifting, sitting, standing, stairs, transfers, and locomotion level  PARTICIPATION LIMITATIONS: community activity and exercise  PERSONAL FACTORS: 3+ comorbidities: see above are also affecting patient's functional outcome.   REHAB POTENTIAL: Good  CLINICAL DECISION MAKING: Evolving/moderate complexity  EVALUATION COMPLEXITY: Moderate  PLAN:  PT FREQUENCY: 1x/week  PT DURATION: 6 weeks per recert 10/17/2023   PLANNED INTERVENTIONS: 97110-Therapeutic exercises, 97530- Therapeutic activity, 97112- Neuromuscular re-education, 97535- Self Care, [address]- Manual therapy, [id number]- Gait training, [phone]- Aquatic Therapy, [phone]- Electrical stimulation (unattended), 97035- Ultrasound, Patient/Family education, Balance training, DME instructions, and Moist heat  PLAN FOR NEXT SESSION:  Review HEP additions and continue to progress exercises for balance.    AQUATICS: Frequency: 1x/wk aquatics and 1x/wk in clinic (alternating weeks) Duration: 1-2 weeks *10/18/2023*Special Instruction: flexibility, strength, balance work; Physiological scientist up aquatic HEP that patient can use in pool at [employer] upon d/c.   Dessie Flow, PT 11/02/23 12:29 PM Phone: [phone] Fax: [phone]   Outpatient Rehab at [facility] [address] [place],  [address] [place], [place] [address] Phone # [phone] Fax # [phone]

## 2023-11-03 ENCOUNTER — Encounter: Payer: Self-pay | Admitting: Sports Medicine

## 2023-11-06 ENCOUNTER — Other Ambulatory Visit: Payer: Self-pay | Admitting: Hematology

## 2023-11-06 DIAGNOSIS — Z9889 Other specified postprocedural states: Secondary | ICD-10-CM

## 2023-11-06 DIAGNOSIS — Z853 Personal history of malignant neoplasm of breast: Secondary | ICD-10-CM

## 2023-11-07 ENCOUNTER — Ambulatory Visit: Admitting: Rehabilitation

## 2023-11-08 ENCOUNTER — Ambulatory Visit

## 2023-11-08 ENCOUNTER — Ambulatory Visit: Admitting: Podiatry

## 2023-11-08 ENCOUNTER — Encounter: Payer: Self-pay | Admitting: Podiatry

## 2023-11-08 ENCOUNTER — Ambulatory Visit (INDEPENDENT_AMBULATORY_CARE_PROVIDER_SITE_OTHER)

## 2023-11-08 DIAGNOSIS — M65972 Unspecified synovitis and tenosynovitis, left ankle and foot: Secondary | ICD-10-CM | POA: Diagnosis not present

## 2023-11-08 DIAGNOSIS — M778 Other enthesopathies, not elsewhere classified: Secondary | ICD-10-CM

## 2023-11-08 MED ORDER — BETAMETHASONE SOD PHOS & ACET 6 (3-3) MG/ML IJ SUSP
3.0000 mg | Freq: Once | INTRAMUSCULAR | Status: AC
  Administered 2023-11-08: 3 mg via INTRA_ARTICULAR

## 2023-11-08 NOTE — Progress Notes (Signed)
 Chief Complaint  Patient presents with   Ankle Pain    Pt is here due to left ankle pain states she injured her foot in February and since then has bee having constant pain and swelling, states boot she was given it hurts her to walk on and compression sleeve she states nothing is helping.    Subjective:  58 y.o. female CKD, not on dialysis, presenting today for follow up evaluation of left foot and ankle pain.  She said that the ankle has been painful for several years.  Recently since last visit she did sustain a misstep and injured her left foot.  It has been painful and tender ever since.  Past Medical History:  Diagnosis Date   Acute pansinusitis 08/02/2017   Anxiety    Arthritis    ASD (atrial septal defect)    s/p closure with Amplatzer device 10/05/04 (Dr. Kyla Phlegm, [facility]) 10/05/04   Cancer [facility])    Cataract    Chronic pain    CKD (chronic kidney disease)    Dyspnea    Fatty liver 09/04/2019   GERD (gastroesophageal reflux disease)    Heart murmur    no longer heard   History of hiatal hernia    Hyperlipidemia    Legally blind in right eye, as defined in [place]     Lumbar herniated disc    Migraines    On home oxygen  therapy 06/15/2022   Pt was given O2 on D/C from [facility] 04/2022   OSA on CPAP 06/15/2022   PONV (postoperative nausea and vomiting)    Sciatica     Past Surgical History:  Procedure Laterality Date   ABLATION     BREAST LUMPECTOMY WITH RADIOACTIVE SEED AND SENTINEL LYMPH NODE BIOPSY Right 11/23/2020   Procedure: RIGHT BREAST LUMPECTOMY WITH RADIOACTIVE SEED AND RIGHT AXILLARY SENTINEL LYMPH NODE BIOPSY;  Surgeon: Enid Harry, MD;  Location: [facility] OR;  Service: General;  Laterality: Right;   BREAST SURGERY Bilateral 2011   Breast Reduction Surgery   BUNIONECTOMY     CARDIAC CATHETERIZATION     10/05/04 [facility]): LM < 25%, otherwise normal coronaries. No pulmonary HTN, Mildly enlarged RV. Secundum ASD s/p closure.   CARDIAC SURGERY     CATARACT  EXTRACTION     CLEFT PALATE REPAIR     s/p cleft lip and palate repair   EYE SURGERY Right 2019   right eye removed   LUMBAR LAMINECTOMY/DECOMPRESSION MICRODISCECTOMY Left 05/23/2016   Procedure: LEFT L4-L5 LATERAL RECESS DECOMPRESSION WITH CENTRAL AND RIGHT DECOMPRESSION VIA LEFT SIDE;  Surgeon: Alphonso Jean, MD;  Location: [facility] OR;  Service: Orthopedics;  Laterality: Left;   LUMBAR LAMINECTOMY/DECOMPRESSION MICRODISCECTOMY Left 05/23/2016   Procedure: LUMBAR LAMINECTOMY/DECOMPRESSION MICRODISCECTOMY Lumbar five - Sacral One 1 LEVEL;  Surgeon: Alphonso Jean, MD;  Location: [facility] OR;  Service: Orthopedics;  Laterality: Left;   REDUCTION MAMMAPLASTY  2011   SHOULDER INJECTION Left 05/23/2016   Procedure: SHOULDER INJECTION;  Surgeon: Alphonso Jean, MD;  Location: [facility] OR;  Service: Orthopedics;  Laterality: Left;  band-aid per pa-c   TRANSTHORACIC ECHOCARDIOGRAM     12/15/05 [facility]): Mild LVH, EF > 55%, grade 1 diastolic dysfunction, Trivial MR/PR/TR.   TUBAL LIGATION      Allergies  Allergen Reactions   Zithromax [Azithromycin] Shortness Of Breath, Itching and Other (See Comments)    TOTAL BODY ITCHING [EVEN SOLES OF FEET] WHEEZING    Tramadol  Itching and Other (See Comments)    Has taken recently  without any side effects.   Psyllium Nausea And Vomiting and Other (See Comments)    Metamucil. Sneezing      Objective / Physical Exam:  General:  The patient is alert and oriented x3 in no acute distress. Dermatology:  Skin is warm, dry and supple bilateral lower extremities. Negative for open lesions or macerations. Vascular:  Palpable pedal pulses bilaterally. No edema or erythema noted. Capillary refill within normal limits. Neurological:  Light touch and protective threshold grossly intact bilaterally.  Musculoskeletal Exam:  Pain on palpation to the anterior lateral medial aspects of the patient's left ankle. Mild edema noted. Range of motion within normal limits to all pedal and  ankle joints bilateral. Muscle strength 5/5 in all groups bilateral.  Radiographic exam LT foot and ankle 11/08/2023: No acute fracture identified.  Moderate degenerative changes noted throughout the midtarsal joint.  Mostly unchanged from prior x-rays  DG Ankle Complete Left 03/24/2023 IMPRESSION: 1. Mild talonavicular and navicular-cuneiform osteoarthritis. 2. Mild chronic enthesopathic change at the Achilles insertion on the calcaneus.  Assessment: 1.  Chronic arthritis/capsulitis left ankle 2.  Pes planovalgus deformity bilateral 3.  Capsulitis left midtarsal joint  Plan of Care:  -Patient was evaluated. X-Rays reviewed.  -Injection of 0.5 mL Celestone  Soluspan injected in the midtarsal joint left foot. - Cam boot dispensed.  WBAT -History of CKD.  No NSAIDs -Return to clinic 3 weeks   Dot Gazella, DPM [facility]  Dr. Dot Gazella, DPM    [address] [place], [place] [address]                Office [phone]  Fax [phone]

## 2023-11-10 ENCOUNTER — Telehealth: Payer: Self-pay | Admitting: Licensed Clinical Social Worker

## 2023-11-10 ENCOUNTER — Encounter: Payer: Self-pay | Admitting: Licensed Clinical Social Worker

## 2023-11-13 ENCOUNTER — Telehealth: Payer: Self-pay

## 2023-11-13 NOTE — Therapy (Signed)
 OUTPATIENT PHYSICAL THERAPY NEURO NOTE  Patient Name: Brandy [name] [name]  MRN: [id number] DOB:06-23-1965, 58 y.o., female Today's Date: 11/13/2023   PCP: Estil Heman, NP  REFERRING PROVIDER: Estil Heman, NP     END OF SESSION:              Past Medical History:  Diagnosis Date   Acute pansinusitis 08/02/2017   Anxiety    Arthritis    ASD (atrial septal defect)    s/p closure with Amplatzer device 10/05/04 (Dr. Kyla Phlegm, [facility]) 10/05/04   Cancer [facility])    Cataract    Chronic pain    CKD (chronic kidney disease)    Dyspnea    Fatty liver 09/04/2019   GERD (gastroesophageal reflux disease)    Heart murmur    no longer heard   History of hiatal hernia    Hyperlipidemia    Legally blind in right eye, as defined in [place]     Lumbar herniated disc    Migraines    On home oxygen  therapy 06/15/2022   Pt was given O2 on D/C from [facility] 04/2022   OSA on CPAP 06/15/2022   PONV (postoperative nausea and vomiting)    Sciatica    Past Surgical History:  Procedure Laterality Date   ABLATION     BREAST LUMPECTOMY WITH RADIOACTIVE SEED AND SENTINEL LYMPH NODE BIOPSY Right 11/23/2020   Procedure: RIGHT BREAST LUMPECTOMY WITH RADIOACTIVE SEED AND RIGHT AXILLARY SENTINEL LYMPH NODE BIOPSY;  Surgeon: Enid Harry, MD;  Location: [facility] OR;  Service: General;  Laterality: Right;   BREAST SURGERY Bilateral 2011   Breast Reduction Surgery   BUNIONECTOMY     CARDIAC CATHETERIZATION     10/05/04 [facility]): LM < 25%, otherwise normal coronaries. No pulmonary HTN, Mildly enlarged RV. Secundum ASD s/p closure.   CARDIAC SURGERY     CATARACT EXTRACTION     CLEFT PALATE REPAIR     s/p cleft lip and palate repair   EYE SURGERY Right 2019   right eye removed   LUMBAR LAMINECTOMY/DECOMPRESSION MICRODISCECTOMY Left 05/23/2016   Procedure: LEFT L4-L5 LATERAL RECESS DECOMPRESSION WITH CENTRAL AND RIGHT DECOMPRESSION VIA LEFT SIDE;  Surgeon: Alphonso Jean, MD;  Location:  [facility] OR;  Service: Orthopedics;  Laterality: Left;   LUMBAR LAMINECTOMY/DECOMPRESSION MICRODISCECTOMY Left 05/23/2016   Procedure: LUMBAR LAMINECTOMY/DECOMPRESSION MICRODISCECTOMY Lumbar five - Sacral One 1 LEVEL;  Surgeon: Alphonso Jean, MD;  Location: [facility] OR;  Service: Orthopedics;  Laterality: Left;   REDUCTION MAMMAPLASTY  2011   SHOULDER INJECTION Left 05/23/2016   Procedure: SHOULDER INJECTION;  Surgeon: Alphonso Jean, MD;  Location: [facility] OR;  Service: Orthopedics;  Laterality: Left;  band-aid per pa-c   TRANSTHORACIC ECHOCARDIOGRAM     12/15/05 [facility]): Mild LVH, EF > 55%, grade 1 diastolic dysfunction, Trivial MR/PR/TR.   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 (HCC) 09/13/2023   Chronic obstructive pulmonary disease, unspecified COPD type (HCC) 03/15/2023   Major depressive disorder, single episode, severe without psychotic features (HCC) 03/15/2023   Orthostatic hypotension 04/24/2022   Hyperbilirubinemia 04/24/2022   Snoring 07/14/2021   Fatigue 07/14/2021   Daytime somnolence 07/14/2021   Status post device closure of ASD 06/03/2021   Secundum ASD 06/03/2021   Decreased diffusion capacity 05/14/2021   Dyspnea 03/04/2021   Pulmonary nodules  04/01/2020   Primary malignant neoplasm of breast with metastasis (HCC) 02/24/2020   Spondylosis without myelopathy or radiculopathy, lumbosacral region 12/31/2019   Chronic low back pain (Bilateral) w/o sciatica 12/31/2019   Chronic hip pain (3ry area of Pain) (Bilateral) (R>L) 11/27/2019   Chronic sacroiliac joint pain (Left) 11/27/2019   Chronic pain syndrome 11/11/2019   Encounter for chronic pain management 11/11/2019   Disorder of skeletal system 11/11/2019   Problems influencing health status 11/11/2019   Abnormal MRI, lumbar spine (08/22/2018) 11/11/2019   Chronic  low back pain (1ry area of Pain) (Bilateral) w/ sciatica (Bilateral) 11/11/2019   DDD (degenerative disc disease), lumbosacral 11/11/2019   Grade 1 Anterolisthesis (L2-3, L3-4) 11/11/2019   Grade 1 Retrolisthesis (L4-5, L5-S1) 11/11/2019   Lumbar facet hypertrophy (Multilevel) (Bilateral) 11/11/2019   Lumbar facet syndrome (Multilevel) (Bilateral) (R>L) 11/11/2019   Chronic lower extremity pain (2ry area of Pain) (Bilateral) (R>L) 11/11/2019   Neurogenic pain 11/11/2019   Chronic musculoskeletal pain 11/11/2019   Infiltrating ductal carcinoma of breast (HCC) 10/08/2019   Fatty liver 09/04/2019   Obesity (BMI 30-39.9) 09/04/2019   GERD (gastroesophageal reflux disease)    Hyperlipidemia    CKD (chronic kidney disease), stage III (HCC)    Depression    Malignant hypertension    Ocular proptosis 02/14/2019   PVD (posterior vitreous detachment), left eye 02/14/2019   Chorioretinal scar of left eye 02/14/2019   Vitreous floaters of left eye 02/14/2019   Bilateral leg weakness 01/24/2019   Insomnia 08/02/2017   Low back pain radiating to both legs 08/02/2017   Migraine 08/02/2017   Neuropathy 08/02/2017   Spinal stenosis, 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    Class: Chronic   Left shoulder tendonitis 05/23/2016    Class: Acute   Post laminectomy syndrome 05/23/2016   Anxiety disorder 01/07/2015    ONSET DATE: 07/20/2023 (MD referral)  REFERRING DIAG:  M54.16 (ICD-10-CM) - Lumbar back pain with radiculopathy affecting left lower extremity  R26.81 (ICD-10-CM) - Unsteady gait  R29.6 (ICD-10-CM) - Frequent falls    THERAPY DIAG:  No diagnosis found.  Rationale for Evaluation and Treatment: Rehabilitation  SUBJECTIVE:                                                                                                                                                                                             SUBJECTIVE STATEMENT: Had to see MD yesterday for ear  infection and started antibiotics.  Had a fall yesterday, tripped over stool helping my sister.    Pt accompanied by: self  PERTINENT HISTORY: Per MD:  She has been experiencing worsening back  pain following a fall last month during a snowstorm. She slipped on ice and fell, initially without pain, but began experiencing consistent pain about a week later. The pain is primarily located in the lower back, radiating more to the left side, with tenderness in the buttocks and back of the thigh. On February 9th, she experienced severe pain radiating from her left foot up her leg, causing her to fall back into bed. No current pain radiating down the leg, weakness, tingling or numbness. No swelling or bruising in the affected areas. She has a history of back pain and underwent a nerve ablation procedure at the end of January to manage her symptoms.  PMH:  breast cancer with metastasis;  Lumbar laminectomy/decompression microdiscectomy (Left, 05/23/2016); Lumbar laminectomy/decompression microdiscectomy (Left, 05/23/2016); Shoulder injection (Left, 05/23/2016); Reduction mammaplasty (2011); Eye surgery (Right, 2019); Tubal ligation; and Breast lumpectomy with radioactive seed and sentinel lymph node biopsy (Right, 11/23/2020).   PAIN:  Are you having pain? Yes: NPRS scale: 3-4/10 Pain location: L ankle, R hip Pain description: sore Aggravating factors: not sure Relieving factors: not sure  PRECAUTIONS: Fall; decreased vision Per MD note:  She has issues with her eyes, specifically dry eyes, requiring the use of artificial tears every hour. She has a prosthetic eye (R) and uses lubricant and tapes her left eyelid shut at night to prevent dryness. Her vision can be distorted at times, affecting her daily activities.   RED FLAGS: None   WEIGHT BEARING RESTRICTIONS: No  FALLS: Has patient fallen in last 6 months? Yes. Number of falls at least 3 falls  LIVING ENVIRONMENT: Lives with: lives with their  spouse Lives in: House/apartment Stairs: 6 steps, multiple steps -getting ready to move into apartment where there are no steps Has following equipment at home: Single point cane and Walker - 4 wheeled  PLOF: Independent with household mobility with device and Independent with community mobility with device  PATIENT GOALS: Pt's goals for therapy become painfree, more independent, exercise more; 10/17/2023:  better balance  OBJECTIVE:     TODAY'S TREATMENT: 11/14/23 Activity Comments                        HOME EXERCISE PROGRAM Access Code: [email or web address] URL: [email or web address] Date: 11/02/2023 Prepared by: [facility] - Outpatient  Rehab - [facility]  Exercises - Supine Posterior Pelvic Tilt  - 1 x daily - 7 x weekly - 1-2 sets - 10 reps - Supine Hamstring Stretch with Strap  - 1 x daily - 5 x weekly - 2 sets - 30 sec hold - Supine Lower Trunk Rotation  - 1 x daily - 5 x weekly - 2 sets - 20 reps - Seated Anterior Pelvic Tilt  - 1 x daily - 7 x weekly - 1-2 sets - 10 reps - Wide Tandem Stance with Eyes Open  - 1 x daily - 5 x weekly - 2 sets - 30 sec hold - Standing Toe Taps  - 1 x daily - 5 x weekly - 2 sets - 10 reps - Standing Shoulder Row with Anchored Resistance  - 1 x daily - 5 x weekly - 2 sets - 10 reps - Standing Anti-Rotation Press with Anchored Resistance  - 1 x daily - 7 x weekly - 3 sets - 10 reps - 2 sec hold - Hooklying Isometric Hip Flexion with Opposite Arm  - 1 x daily - 7 x weekly - 3 sets - 10 reps -  2 sec hold - Side Stepping with Counter Support  - 1 x daily - 7 x weekly - 1 sets - 3-5 reps - Standing Quarter Turn with Counter Support  - 1 x daily - 7 x weekly - 1 sets - 3-5 reps     ---------------------------------------------  Note: Objective measures were completed at Evaluation unless otherwise noted.  DIAGNOSTIC FINDINGS: x-rays to be completed 07/26/2023  COGNITION: Overall cognitive status: Within functional limits for  tasks assessed   SENSATION: No reports of numbness   POSTURE: rounded shoulders, forward head, and posterior pelvic tilt  LOWER EXTREMITY ROM:     Active  Right Eval Left Eval  Hip flexion    Hip extension    Hip abduction    Hip adduction    Hip internal rotation    Hip external rotation    Knee flexion    Knee extension    Ankle dorsiflexion 5 5  Ankle plantarflexion    Ankle inversion    Ankle eversion     (Blank rows = not tested)  LOWER EXTREMITY MMT:    MMT Right Eval Left Eval  Hip flexion 4+ 4+  Hip extension    Hip abduction 4 4  Hip adduction 4 4  Hip internal rotation    Hip external rotation    Knee flexion 4 4  Knee extension 4+ 4+  Ankle dorsiflexion 4 4  Ankle plantarflexion    Ankle inversion    Ankle eversion    (Blank rows = not tested)  LUMBAR: Palpation:  Tender to palpation along paraspinals thoracic spine, SI joint area, bilateral buttocks/gluts/piriformis; pt tender to palpation along IT band R>L Baseline pain in standing:  0/10 Repeated standing flexion: 3/10 pain, into bilat buttocks Repeated standing extension:1-2/10 pain centralized at mid- low back Limited in lumbar extension flexibility  Hamstring flexibility from supine 90/90:  R  -15 degrees; L -20 degrees SLR test from supine:  R 85 degrees, L 80 degrees  TRANSFERS: Assistive device utilized: None BUE support Sit to stand: Modified independence Stand to sit: Modified independence   GAIT: Gait pattern: step through pattern, antalgic, lateral lean- Right, and lateral lean- Left Distance walked: 40-50 ft Assistive device utilized: Single point cane Level of assistance: Modified independence Comments: Has rollator at home, reports less pain with gait when using rollator   PATIENT SURVEYS:  Modified Oswestry (Back Index):  48                                                                                                                                GOALS: Goals  reviewed with patient? Yes  SHORT TERM GOALS: Target date: 08/25/2023  Pt will be independent with HEP for improved strength, flexibility, balance, gait. Baseline: Pt denies questions on HEP 08/23/23 Goal status: MET  08/23/23  2.  Pt will report low back pain reduced by 50% during functional daily activities. Baseline:  Reports LBP is "  touch and go." Reports that is it a "teeny weeny bit better" 08/24/23  Goal status: IN PROGRESS 08/24/23   3. Patient to score at least 46/56 on Berg in order to decrease risk of falls.  Baseline: 42 08/24/23; 52/56 09/19/23 Goal status:MET 09/19/23  4.  Pt will verbalize understanding of fall prevention and posture/body mechanics for decreased pain/decreased fall risk.  Baseline: provided today 08/23/23  Goal status: MET 08/23/23   LONG TERM GOALS: UPDATED TARGET 12/01/2023  Pt will be independent with progression of HEP (*including aquatic HEP) for improved strength, balance, gait. Baseline: interested in aquatic HEP for use of [facility] pool Goal status: IN PROGRESS 10/17/2023  2. Pt will improve 5x sit<>stand to less than or equal to 15 sec to demonstrate improved functional strength and transfer efficiency.  Baseline:  20 sec  Goal status:  INITIAL, 10/17/2023  3.  Pt will improve TUG score to less than or equal to 25 sec for decreased fall risk.. Baseline: 39 sec Goal status: INITIAL 10/17/23  4.  Pt will verbalize plans for continued community fitness upon d/c from PT to maximize gains in PT. Baseline:  educated on this today, pt reports that she has a [employer] 09/19/23 Goal status: IN PROGRESS 10/17/23  5.  Pt will improve gait velocity to at least 1.8 ft/sec for improved gait efficiency and safety.  Baseline:  1.19 ft/sec  Goal status:  INITIAL, 10/17/2023      ASSESSMENT:  CLINICAL IMPRESSION: Pt presents today with reports of recent fall and continued concerns about balance. Skilled PT session focused on updated HEP for balance.  Pt needs  UE support and cues for slowed pace.  Also discussed options for assistance within the home, like an aide service and she voices plans to discuss with social worker.  Also, discussed that rollator walker is safest option for patient at all times.  Pt will continue to benefit from skilled PT towards goals for improved functional mobility and decreased fall risk.  OBJECTIVE IMPAIRMENTS: Abnormal gait, decreased balance, decreased mobility, difficulty walking, decreased ROM, decreased strength, impaired flexibility, postural dysfunction, and pain.   ACTIVITY LIMITATIONS: lifting, sitting, standing, stairs, transfers, and locomotion level  PARTICIPATION LIMITATIONS: community activity and exercise  PERSONAL FACTORS: 3+ comorbidities: see above are also affecting patient's functional outcome.   REHAB POTENTIAL: Good  CLINICAL DECISION MAKING: Evolving/moderate complexity  EVALUATION COMPLEXITY: Moderate  PLAN:  PT FREQUENCY: 1x/week  PT DURATION: 6 weeks per recert 10/17/2023   PLANNED INTERVENTIONS: 97110-Therapeutic exercises, 97530- Therapeutic activity, 97112- Neuromuscular re-education, 97535- Self Care, 65784- Manual therapy, [id number]- Gait training, [phone]- Aquatic Therapy, [phone]- Electrical stimulation (unattended), 97035- Ultrasound, Patient/Family education, Balance training, DME instructions, and Moist heat  PLAN FOR NEXT SESSION:  Review HEP additions and continue to progress exercises for balance.    AQUATICS: Frequency: 1x/wk aquatics and 1x/wk in clinic (alternating weeks) Duration: 1-2 weeks *10/18/2023*Special Instruction: flexibility, strength, balance work; Physiological scientist up aquatic HEP that patient can use in pool at [employer] upon d/c.

## 2023-11-13 NOTE — Progress Notes (Signed)
   Telephone encounter was:  Unsuccessful.  11/13/2023 Name: Brandy [name]  MRN: [id number] DOB: [birth year]  Unsuccessful outbound call made today to assist with:  [employer] Attempt:  1st Attempt  No answer and unable to leave a message    Azell Leopard [facility]  [facility] Guide, Phone: [phone] Fax: [phone] Website: [email or web address]

## 2023-11-14 ENCOUNTER — Ambulatory Visit: Attending: Family | Admitting: Physical Therapy

## 2023-11-14 ENCOUNTER — Telehealth: Payer: Self-pay

## 2023-11-14 ENCOUNTER — Encounter: Payer: Self-pay | Admitting: Physical Therapy

## 2023-11-14 DIAGNOSIS — M6281 Muscle weakness (generalized): Secondary | ICD-10-CM | POA: Diagnosis not present

## 2023-11-14 DIAGNOSIS — M5459 Other low back pain: Secondary | ICD-10-CM | POA: Diagnosis not present

## 2023-11-14 DIAGNOSIS — R2689 Other abnormalities of gait and mobility: Secondary | ICD-10-CM | POA: Insufficient documentation

## 2023-11-14 DIAGNOSIS — R2681 Unsteadiness on feet: Secondary | ICD-10-CM | POA: Insufficient documentation

## 2023-11-14 NOTE — Progress Notes (Signed)
   Telephone encounter was:  Unsuccessful.  11/14/2023 Name: KAROL SKARZYNSKI [name]  MRN: [id number] DOB: [birth year]  Unsuccessful outbound call made today to assist with:  [employer] Attempt:  2nd Attempt  No answer and unable to leave a message    Azell Leopard [facility]  [facility] Guide, Phone: [phone] Fax: [phone] Website: [email or web address]

## 2023-11-15 ENCOUNTER — Other Ambulatory Visit: Payer: Self-pay | Admitting: Hematology

## 2023-11-15 ENCOUNTER — Telehealth: Payer: Self-pay

## 2023-11-15 DIAGNOSIS — C50919 Malignant neoplasm of unspecified site of unspecified female breast: Secondary | ICD-10-CM

## 2023-11-15 NOTE — Progress Notes (Signed)
   Telephone encounter was:  Successful.  Complex Care Management Note Care Guide Note  11/15/2023 Name: Brandy [name]  MRN: [id number] DOB: Aug 14, 1965  Brandy [name]  is a 58 y.o. year old female who is a primary care patient of Ngetich, Dinah C, NP . The community resource team was consulted for assistance with [employer]   SDOH screenings and interventions completed:  No        Care guide performed the following interventions: Patient provided with information about care guide support team and interviewed to confirm resource needs.Patient is looking for in home care. Pt refused resources for services of the blind   Follow Up Plan:  No further follow up planned at this time. The patient has been provided with needed resources.  Encounter Outcome:  Patient Refused    Brandy [name] [facility]  [facility] Guide, Phone: [phone] Fax: [phone] Website: [email or web address]

## 2023-11-17 ENCOUNTER — Inpatient Hospital Stay: Payer: Medicare Other

## 2023-11-20 ENCOUNTER — Inpatient Hospital Stay: Attending: Hematology

## 2023-11-20 ENCOUNTER — Inpatient Hospital Stay: Payer: Self-pay

## 2023-11-20 ENCOUNTER — Inpatient Hospital Stay

## 2023-11-20 VITALS — BP 134/61 | HR 81 | Temp 97.9°F | Resp 18

## 2023-11-20 DIAGNOSIS — Z1721 Progesterone receptor positive status: Secondary | ICD-10-CM | POA: Diagnosis not present

## 2023-11-20 DIAGNOSIS — C50511 Malignant neoplasm of lower-outer quadrant of right female breast: Secondary | ICD-10-CM | POA: Insufficient documentation

## 2023-11-20 DIAGNOSIS — Z1732 Human epidermal growth factor receptor 2 negative status: Secondary | ICD-10-CM | POA: Insufficient documentation

## 2023-11-20 DIAGNOSIS — Z170 Estrogen receptor positive status [ER+]: Secondary | ICD-10-CM | POA: Diagnosis not present

## 2023-11-20 DIAGNOSIS — C50919 Malignant neoplasm of unspecified site of unspecified female breast: Secondary | ICD-10-CM

## 2023-11-20 DIAGNOSIS — Z5111 Encounter for antineoplastic chemotherapy: Secondary | ICD-10-CM | POA: Diagnosis not present

## 2023-11-20 DIAGNOSIS — C7800 Secondary malignant neoplasm of unspecified lung: Secondary | ICD-10-CM | POA: Insufficient documentation

## 2023-11-20 LAB — CBC WITH DIFFERENTIAL (CANCER CENTER ONLY)
Abs Immature Granulocytes: 0.01 10*3/uL (ref 0.00–0.07)
Basophils Absolute: 0.1 10*3/uL (ref 0.0–0.1)
Basophils Relative: 1 %
Eosinophils Absolute: 0.1 10*3/uL (ref 0.0–0.5)
Eosinophils Relative: 2 %
HCT: 35.8 % — ABNORMAL LOW (ref 36.0–46.0)
Hemoglobin: 12.1 g/dL (ref 12.0–15.0)
Immature Granulocytes: 0 %
Lymphocytes Relative: 47 %
Lymphs Abs: 2 10*3/uL (ref 0.7–4.0)
MCH: 35.1 pg — ABNORMAL HIGH (ref 26.0–34.0)
MCHC: 33.8 g/dL (ref 30.0–36.0)
MCV: 103.8 fL — ABNORMAL HIGH (ref 80.0–100.0)
Monocytes Absolute: 0.3 10*3/uL (ref 0.1–1.0)
Monocytes Relative: 8 %
Neutro Abs: 1.8 10*3/uL (ref 1.7–7.7)
Neutrophils Relative %: 42 %
Platelet Count: 194 10*3/uL (ref 150–400)
RBC: 3.45 MIL/uL — ABNORMAL LOW (ref 3.87–5.11)
RDW: 14.9 % (ref 11.5–15.5)
WBC Count: 4.3 10*3/uL (ref 4.0–10.5)
nRBC: 0 % (ref 0.0–0.2)

## 2023-11-20 LAB — CMP (CANCER CENTER ONLY)
ALT: 92 U/L — ABNORMAL HIGH (ref 0–44)
AST: 69 U/L — ABNORMAL HIGH (ref 15–41)
Albumin: 3.7 g/dL (ref 3.5–5.0)
Alkaline Phosphatase: 239 U/L — ABNORMAL HIGH (ref 38–126)
Anion gap: 6 (ref 5–15)
BUN: 11 mg/dL (ref 6–20)
CO2: 30 mmol/L (ref 22–32)
Calcium: 9.4 mg/dL (ref 8.9–10.3)
Chloride: 104 mmol/L (ref 98–111)
Creatinine: 1.2 mg/dL — ABNORMAL HIGH (ref 0.44–1.00)
GFR, Estimated: 52 mL/min — ABNORMAL LOW (ref >60)
Glucose, Bld: 87 mg/dL (ref 70–99)
Potassium: 4.1 mmol/L (ref 3.5–5.1)
Sodium: 140 mmol/L (ref 135–145)
Total Bilirubin: 0.8 mg/dL (ref 0.0–1.2)
Total Protein: 7.1 g/dL (ref 6.5–8.1)

## 2023-11-20 MED ORDER — FULVESTRANT 250 MG/5ML IM SOSY
500.0000 mg | PREFILLED_SYRINGE | Freq: Once | INTRAMUSCULAR | Status: AC
  Administered 2023-11-20: 500 mg via INTRAMUSCULAR
  Filled 2023-11-20: qty 10

## 2023-11-20 NOTE — Progress Notes (Signed)
 Pt. Here for Faslodex  injection.  States she was supposed to be scheduled for a Port placement.  Beth Wright/RN added to secure chat per [employer].  Beth informed patient, that the scheduler has been trying to contact her.  Pt. given number to call the scheduler herself

## 2023-11-21 ENCOUNTER — Ambulatory Visit: Admitting: Physical Therapy

## 2023-11-21 ENCOUNTER — Telehealth: Payer: Self-pay

## 2023-11-21 LAB — CANCER ANTIGEN 27.29: CA 27.29: 61.7 U/mL — ABNORMAL HIGH (ref 0.0–38.6)

## 2023-11-21 LAB — CANCER ANTIGEN 15-3: CA 15-3: 48.1 U/mL — ABNORMAL HIGH (ref 0.0–25.0)

## 2023-11-21 NOTE — Progress Notes (Signed)
 Complex Care Management Care Guide Note  11/21/2023 Name: Brandy [name]  MRN: [id number] DOB: Dec 14, 1965  [name]  is a 58 y.o. year old female who is a primary care patient of Ngetich, Dinah C, NP and is actively engaged with the care management team. I reached out to [name]  by phone today to assist with re-scheduling  with the Licensed Clinical Child psychotherapist.  Follow up plan: Unsuccessful telephone outreach attempt made. A HIPAA compliant phone message was left for the patient providing contact information and requesting a return call.  Creola Doheny [facility], [facility] Guide  Direct Dial: [phone]  Fax [phone]

## 2023-11-21 NOTE — Therapy (Incomplete)
 OUTPATIENT PHYSICAL THERAPY NEURO NOTE  Patient Name: Brandy [name] [name]  MRN: [id number] DOB:April 13, 1966, 58 y.o., female Today's Date: 11/21/2023   PCP: Estil Heman, NP  REFERRING PROVIDER: Estil Heman, NP     END OF SESSION:               Past Medical History:  Diagnosis Date   Acute pansinusitis 08/02/2017   Anxiety    Arthritis    ASD (atrial septal defect)    s/p closure with Amplatzer device 10/05/04 (Dr. Kyla Phlegm, [facility]) 10/05/04   Cancer [facility])    Cataract    Chronic pain    CKD (chronic kidney disease)    Dyspnea    Fatty liver 09/04/2019   GERD (gastroesophageal reflux disease)    Heart murmur    no longer heard   History of hiatal hernia    Hyperlipidemia    Legally blind in right eye, as defined in [place]     Lumbar herniated disc    Migraines    On home oxygen  therapy 06/15/2022   Pt was given O2 on D/C from [facility] 04/2022   OSA on CPAP 06/15/2022   PONV (postoperative nausea and vomiting)    Sciatica    Past Surgical History:  Procedure Laterality Date   ABLATION     BREAST LUMPECTOMY WITH RADIOACTIVE SEED AND SENTINEL LYMPH NODE BIOPSY Right 11/23/2020   Procedure: RIGHT BREAST LUMPECTOMY WITH RADIOACTIVE SEED AND RIGHT AXILLARY SENTINEL LYMPH NODE BIOPSY;  Surgeon: Enid Harry, MD;  Location: [facility] OR;  Service: General;  Laterality: Right;   BREAST SURGERY Bilateral 2011   Breast Reduction Surgery   BUNIONECTOMY     CARDIAC CATHETERIZATION     10/05/04 [facility]): LM < 25%, otherwise normal coronaries. No pulmonary HTN, Mildly enlarged RV. Secundum ASD s/p closure.   CARDIAC SURGERY     CATARACT EXTRACTION     CLEFT PALATE REPAIR     s/p cleft lip and palate repair   EYE SURGERY Right 2019   right eye removed   LUMBAR LAMINECTOMY/DECOMPRESSION MICRODISCECTOMY Left 05/23/2016   Procedure: LEFT L4-L5 LATERAL RECESS DECOMPRESSION WITH CENTRAL AND RIGHT DECOMPRESSION VIA LEFT SIDE;  Surgeon: Alphonso Jean, MD;   Location: [facility] OR;  Service: Orthopedics;  Laterality: Left;   LUMBAR LAMINECTOMY/DECOMPRESSION MICRODISCECTOMY Left 05/23/2016   Procedure: LUMBAR LAMINECTOMY/DECOMPRESSION MICRODISCECTOMY Lumbar five - Sacral One 1 LEVEL;  Surgeon: Alphonso Jean, MD;  Location: [facility] OR;  Service: Orthopedics;  Laterality: Left;   REDUCTION MAMMAPLASTY  2011   SHOULDER INJECTION Left 05/23/2016   Procedure: SHOULDER INJECTION;  Surgeon: Alphonso Jean, MD;  Location: [facility] OR;  Service: Orthopedics;  Laterality: Left;  band-aid per pa-c   TRANSTHORACIC ECHOCARDIOGRAM     12/15/05 [facility]): Mild LVH, EF > 55%, grade 1 diastolic dysfunction, Trivial MR/PR/TR.   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 (HCC) 09/13/2023   Chronic obstructive pulmonary disease, unspecified COPD type (HCC) 03/15/2023   Major depressive disorder, single episode, severe without psychotic features (HCC) 03/15/2023   Orthostatic hypotension 04/24/2022   Hyperbilirubinemia 04/24/2022   Snoring 07/14/2021   Fatigue 07/14/2021   Daytime somnolence 07/14/2021   Status post device closure of ASD 06/03/2021   Secundum ASD 06/03/2021   Decreased diffusion capacity 05/14/2021   Dyspnea 03/04/2021   Pulmonary  nodules 04/01/2020   Primary malignant neoplasm of breast with metastasis (HCC) 02/24/2020   Spondylosis without myelopathy or radiculopathy, lumbosacral region 12/31/2019   Chronic low back pain (Bilateral) w/o sciatica 12/31/2019   Chronic hip pain (3ry area of Pain) (Bilateral) (R>L) 11/27/2019   Chronic sacroiliac joint pain (Left) 11/27/2019   Chronic pain syndrome 11/11/2019   Encounter for chronic pain management 11/11/2019   Disorder of skeletal system 11/11/2019   Problems influencing health status 11/11/2019   Abnormal MRI, lumbar spine (08/22/2018) 11/11/2019    Chronic low back pain (1ry area of Pain) (Bilateral) w/ sciatica (Bilateral) 11/11/2019   DDD (degenerative disc disease), lumbosacral 11/11/2019   Grade 1 Anterolisthesis (L2-3, L3-4) 11/11/2019   Grade 1 Retrolisthesis (L4-5, L5-S1) 11/11/2019   Lumbar facet hypertrophy (Multilevel) (Bilateral) 11/11/2019   Lumbar facet syndrome (Multilevel) (Bilateral) (R>L) 11/11/2019   Chronic lower extremity pain (2ry area of Pain) (Bilateral) (R>L) 11/11/2019   Neurogenic pain 11/11/2019   Chronic musculoskeletal pain 11/11/2019   Infiltrating ductal carcinoma of breast (HCC) 10/08/2019   Fatty liver 09/04/2019   Obesity (BMI 30-39.9) 09/04/2019   GERD (gastroesophageal reflux disease)    Hyperlipidemia    CKD (chronic kidney disease), stage III (HCC)    Depression    Malignant hypertension    Ocular proptosis 02/14/2019   PVD (posterior vitreous detachment), left eye 02/14/2019   Chorioretinal scar of left eye 02/14/2019   Vitreous floaters of left eye 02/14/2019   Bilateral leg weakness 01/24/2019   Insomnia 08/02/2017   Low back pain radiating to both legs 08/02/2017   Migraine 08/02/2017   Neuropathy 08/02/2017   Spinal stenosis, 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    Class: Chronic   Left shoulder tendonitis 05/23/2016    Class: Acute   Post laminectomy syndrome 05/23/2016   Anxiety disorder 01/07/2015    ONSET DATE: 07/20/2023 (MD referral)  REFERRING DIAG:  M54.16 (ICD-10-CM) - Lumbar back pain with radiculopathy affecting left lower extremity  R26.81 (ICD-10-CM) - Unsteady gait  R29.6 (ICD-10-CM) - Frequent falls    THERAPY DIAG:  No diagnosis found.  Rationale for Evaluation and Treatment: Rehabilitation  SUBJECTIVE:                                                                                                                                                                                             SUBJECTIVE STATEMENT: ***Reports that she was  given a boot for the L ankle. reports that she has not had it on for the past couple of days. Sees MD again in 3 weeks.   Pt accompanied by: self  PERTINENT HISTORY:  Per MD:  She has been experiencing worsening back pain following a fall last month during a snowstorm. She slipped on ice and fell, initially without pain, but began experiencing consistent pain about a week later. The pain is primarily located in the lower back, radiating more to the left side, with tenderness in the buttocks and back of the thigh. On February 9th, she experienced severe pain radiating from her left foot up her leg, causing her to fall back into bed. No current pain radiating down the leg, weakness, tingling or numbness. No swelling or bruising in the affected areas. She has a history of back pain and underwent a nerve ablation procedure at the end of January to manage her symptoms.  PMH:  breast cancer with metastasis;  Lumbar laminectomy/decompression microdiscectomy (Left, 05/23/2016); Lumbar laminectomy/decompression microdiscectomy (Left, 05/23/2016); Shoulder injection (Left, 05/23/2016); Reduction mammaplasty (2011); Eye surgery (Right, 2019); Tubal ligation; and Breast lumpectomy with radioactive seed and sentinel lymph node biopsy (Right, 11/23/2020).   PAIN:  Are you having pain? Yes: NPRS scale: 0/10 Pain location: L ankle, R hip Pain description: sore Aggravating factors: not sure Relieving factors: not sure  PRECAUTIONS: Per Podiatry's note 11/08/23: Cam boot dispensed. WBAT Fall; decreased vision Per MD note:  She has issues with her eyes, specifically dry eyes, requiring the use of artificial tears every hour. She has a prosthetic eye (R) and uses lubricant and tapes her left eyelid shut at night to prevent dryness. Her vision can be distorted at times, affecting her daily activities.   RED FLAGS: None   WEIGHT BEARING RESTRICTIONS: No  FALLS: Has patient fallen in last 6 months? Yes. Number of  falls at least 3 falls  LIVING ENVIRONMENT: Lives with: lives with their spouse Lives in: House/apartment Stairs: 6 steps, multiple steps -getting ready to move into apartment where there are no steps Has following equipment at home: Single point cane and Walker - 4 wheeled  PLOF: Independent with household mobility with device and Independent with community mobility with device  PATIENT GOALS: Pt's goals for therapy become painfree, more independent, exercise more; 10/17/2023:  better balance  OBJECTIVE:     TODAY'S TREATMENT: 11/21/2023 Activity Comments                       TODAY'S TREATMENT: 11/14/23 Activity Comments  Assisted pt in donning CAM boot  Pt reported improved comfort and reports understanding   LAQ 5# 2x15 each  Good form  Sitting HS curl green TB 2x15 each  More difficulty on L LE     Printed out the following HEP per aquatic therapist Emily's request. Encouraged patient continue performing this exercise regimen at [facility] as this is a safe way to exercise despite L ankle pain.  Access Code: [facility] URL: [email or web address] Date: 11/14/2023 Prepared by: [facility] - Outpatient  Rehab - [facility]  Exercises - Forward and Backward Stepping at [facility]  - 1 x daily - 7 x weekly - 1 sets - 4 reps - Lateral Stepping at Pool Wall  - 1 x daily - 7 x weekly - 1 sets - 4 reps - Forward March with Opposite Arm Knee Taps and Hand Floats  - 1 x daily - 7 x weekly - 1 sets - 4 reps - Side to Side Hamstring Stretch with Noodle at Pool Wall  - 1 x daily - 7 x weekly - 1 sets - 2 reps - Gastroc Stretch on Step  - 1  x daily - 7 x weekly - 1 sets - 10 reps - Sit to Stand Without Arm Support  - 1 x daily - 7 x weekly - 1 sets - 10 reps - Standing Hip Flexion Extension at [employer]  - 1 x daily - 7 x weekly - 1 sets - 10 reps     PATIENT EDUCATION: Education details: edu on importance of staying compliant with CAM boot, encouraged consistent use  of RW especially while CAM boot is donned, encouraged [employer] membership to maintain aquatic exercises and opportunities for strength training  Person educated: Patient Education method: Explanation, Demonstration, Tactile cues, Verbal cues, and Handouts Education comprehension: verbalized understanding    HOME EXERCISE PROGRAM Access Code: [email or web address] URL: [email or web address] Date: 11/02/2023 Prepared by: [facility] - Outpatient  Rehab - [facility]  Exercises - Supine Posterior Pelvic Tilt  - 1 x daily - 7 x weekly - 1-2 sets - 10 reps - Supine Hamstring Stretch with Strap  - 1 x daily - 5 x weekly - 2 sets - 30 sec hold - Supine Lower Trunk Rotation  - 1 x daily - 5 x weekly - 2 sets - 20 reps - Seated Anterior Pelvic Tilt  - 1 x daily - 7 x weekly - 1-2 sets - 10 reps - Wide Tandem Stance with Eyes Open  - 1 x daily - 5 x weekly - 2 sets - 30 sec hold - Standing Toe Taps  - 1 x daily - 5 x weekly - 2 sets - 10 reps - Standing Shoulder Row with Anchored Resistance  - 1 x daily - 5 x weekly - 2 sets - 10 reps - Standing Anti-Rotation Press with Anchored Resistance  - 1 x daily - 7 x weekly - 3 sets - 10 reps - 2 sec hold - Hooklying Isometric Hip Flexion with Opposite Arm  - 1 x daily - 7 x weekly - 3 sets - 10 reps - 2 sec hold - Side Stepping with Counter Support  - 1 x daily - 7 x weekly - 1 sets - 3-5 reps - Standing Quarter Turn with Counter Support  - 1 x daily - 7 x weekly - 1 sets - 3-5 reps     ---------------------------------------------  Note: Objective measures were completed at Evaluation unless otherwise noted.  DIAGNOSTIC FINDINGS: x-rays to be completed 07/26/2023  COGNITION: Overall cognitive status: Within functional limits for tasks assessed   SENSATION: No reports of numbness   POSTURE: rounded shoulders, forward head, and posterior pelvic tilt  LOWER EXTREMITY ROM:     Active  Right Eval Left Eval  Hip flexion    Hip extension     Hip abduction    Hip adduction    Hip internal rotation    Hip external rotation    Knee flexion    Knee extension    Ankle dorsiflexion 5 5  Ankle plantarflexion    Ankle inversion    Ankle eversion     (Blank rows = not tested)  LOWER EXTREMITY MMT:    MMT Right Eval Left Eval  Hip flexion 4+ 4+  Hip extension    Hip abduction 4 4  Hip adduction 4 4  Hip internal rotation    Hip external rotation    Knee flexion 4 4  Knee extension 4+ 4+  Ankle dorsiflexion 4 4  Ankle plantarflexion    Ankle inversion    Ankle eversion    (Blank rows = not  tested)  LUMBAR: Palpation:  Tender to palpation along paraspinals thoracic spine, SI joint area, bilateral buttocks/gluts/piriformis; pt tender to palpation along IT band R>L Baseline pain in standing:  0/10 Repeated standing flexion: 3/10 pain, into bilat buttocks Repeated standing extension:1-2/10 pain centralized at mid- low back Limited in lumbar extension flexibility  Hamstring flexibility from supine 90/90:  R  -15 degrees; L -20 degrees SLR test from supine:  R 85 degrees, L 80 degrees  TRANSFERS: Assistive device utilized: None BUE support Sit to stand: Modified independence Stand to sit: Modified independence   GAIT: Gait pattern: step through pattern, antalgic, lateral lean- Right, and lateral lean- Left Distance walked: 40-50 ft Assistive device utilized: Single point cane Level of assistance: Modified independence Comments: Has rollator at home, reports less pain with gait when using rollator   PATIENT SURVEYS:  Modified Oswestry (Back Index):  48                                                                                                                                GOALS: Goals reviewed with patient? Yes  SHORT TERM GOALS: Target date: 08/25/2023  Pt will be independent with HEP for improved strength, flexibility, balance, gait. Baseline: Pt denies questions on HEP 08/23/23 Goal status: MET   08/23/23  2.  Pt will report low back pain reduced by 50% during functional daily activities. Baseline:  Reports LBP is touch and go. Reports that is it a teeny weeny bit better 08/24/23  Goal status: IN PROGRESS 08/24/23   3. Patient to score at least 46/56 on Berg in order to decrease risk of falls.  Baseline: 42 08/24/23; 52/56 09/19/23 Goal status:MET 09/19/23  4.  Pt will verbalize understanding of fall prevention and posture/body mechanics for decreased pain/decreased fall risk.  Baseline: provided today 08/23/23  Goal status: MET 08/23/23   LONG TERM GOALS: UPDATED TARGET 12/01/2023  Pt will be independent with progression of HEP (*including aquatic HEP) for improved strength, balance, gait. Baseline: interested in aquatic HEP for use of [employer] pool Goal status: IN PROGRESS 10/17/2023  2. Pt will improve 5x sit<>stand to less than or equal to 15 sec to demonstrate improved functional strength and transfer efficiency.  Baseline:  20 sec  Goal status:  INITIAL, 10/17/2023  3.  Pt will improve TUG score to less than or equal to 25 sec for decreased fall risk.. Baseline: 39 sec Goal status: INITIAL 10/17/23  4.  Pt will verbalize plans for continued community fitness upon d/c from PT to maximize gains in PT. Baseline:  educated on this today, pt reports that she has a [employer] 09/19/23 Goal status: IN PROGRESS 10/17/23  5.  Pt will improve gait velocity to at least 1.8 ft/sec for improved gait efficiency and safety.  Baseline:  1.19 ft/sec  Goal status:  INITIAL, 10/17/2023      ASSESSMENT:  CLINICAL IMPRESSION: Pt presents today ***. Skilled PT session focused  on ***. Pt needs ***. Pt will continue to benefit from skilled PT towards goals for improved functional mobility and decreased fall risk.   Patient arrived to session with L walking boot donned- reports that she has not had it on for the past couple of days. Educated pt on being compliant with this device and  educated on properly donning it. Patient reported understanding. Session focused on LE strengthening activities in sitting position to avoid exacerbating L ankle pain and education on maintaining safety and participating in [facility] for exercise regimen. Patient tolerated session well and without complaints upon leaving.  OBJECTIVE IMPAIRMENTS: Abnormal gait, decreased balance, decreased mobility, difficulty walking, decreased ROM, decreased strength, impaired flexibility, postural dysfunction, and pain.   ACTIVITY LIMITATIONS: lifting, sitting, standing, stairs, transfers, and locomotion level  PARTICIPATION LIMITATIONS: community activity and exercise  PERSONAL FACTORS: 3+ comorbidities: see above are also affecting patient's functional outcome.   REHAB POTENTIAL: Good  CLINICAL DECISION MAKING: Evolving/moderate complexity  EVALUATION COMPLEXITY: Moderate  PLAN:  PT FREQUENCY: 1x/week  PT DURATION: 6 weeks per recert 10/17/2023   PLANNED INTERVENTIONS: 97110-Therapeutic exercises, 97530- Therapeutic activity, 97112- Neuromuscular re-education, 97535- Self Care, 11914- Manual therapy, [id number]- Gait training, [phone]- Aquatic Therapy, [phone]- Electrical stimulation (unattended), 97035- Ultrasound, Patient/Family education, Balance training, DME instructions, and Moist heat  PLAN FOR NEXT SESSION: ***Review HEP additions and continue to progress exercises for balance.     Dessie Flow, PT 11/21/23 7:56 AM Phone: [phone] Fax: [phone]   [facility] at [facility] [address], [address] [place], [place] [address] Phone # [phone] Fax # [phone]

## 2023-11-24 ENCOUNTER — Encounter: Payer: Self-pay | Admitting: Physical Therapy

## 2023-11-24 ENCOUNTER — Ambulatory Visit: Admitting: Physical Therapy

## 2023-11-24 DIAGNOSIS — R2681 Unsteadiness on feet: Secondary | ICD-10-CM

## 2023-11-24 DIAGNOSIS — M5459 Other low back pain: Secondary | ICD-10-CM | POA: Diagnosis not present

## 2023-11-24 DIAGNOSIS — M6281 Muscle weakness (generalized): Secondary | ICD-10-CM | POA: Diagnosis not present

## 2023-11-24 DIAGNOSIS — R2689 Other abnormalities of gait and mobility: Secondary | ICD-10-CM | POA: Diagnosis not present

## 2023-11-24 NOTE — Therapy (Signed)
 OUTPATIENT PHYSICAL THERAPY NEURO NOTE  Patient Name: Brandy [name]  MRN: [id number] DOB:06/07/1965, 58 y.o., female Today's Date: 11/24/2023   PCP: Estil Heman, NP  REFERRING PROVIDER: Estil Heman, NP     END OF SESSION:  PT End of Session - 11/24/23 0924     Visit Number 17    Number of Visits 19    Date for PT Re-Evaluation 12/01/23   per recert 10/17/2023   Authorization Type [employer] Medicare-submitted for reauth after 10/17/2023 visit    Authorization Time Period approved 6 visits from 10/17/23-11/28/23   new auth submitted   Authorization - Visit Number 5    Authorization - Number of Visits 6    PT Start Time 0930    PT Stop Time 1012    PT Time Calculation (min) 42 min    Activity Tolerance Patient tolerated treatment well    Behavior During Therapy [facility] for tasks assessed/performed                      Past Medical History:  Diagnosis Date   Acute pansinusitis 08/02/2017   Anxiety    Arthritis    ASD (atrial septal defect)    s/p closure with Amplatzer device 10/05/04 (Dr. Kyla Phlegm, [facility]) 10/05/04   Cancer [facility])    Cataract    Chronic pain    CKD (chronic kidney disease)    Dyspnea    Fatty liver 09/04/2019   GERD (gastroesophageal reflux disease)    Heart murmur    no longer heard   History of hiatal hernia    Hyperlipidemia    Legally blind in right eye, as defined in [place]     Lumbar herniated disc    Migraines    On home oxygen  therapy 06/15/2022   Pt was given O2 on D/C from [facility] 04/2022   OSA on CPAP 06/15/2022   PONV (postoperative nausea and vomiting)    Sciatica    Past Surgical History:  Procedure Laterality Date   ABLATION     BREAST LUMPECTOMY WITH RADIOACTIVE SEED AND SENTINEL LYMPH NODE BIOPSY Right 11/23/2020   Procedure: RIGHT BREAST LUMPECTOMY WITH RADIOACTIVE SEED AND RIGHT AXILLARY SENTINEL LYMPH NODE BIOPSY;  Surgeon: Enid Harry, MD;  Location: [facility] OR;  Service: General;  Laterality: Right;    BREAST SURGERY Bilateral 2011   Breast Reduction Surgery   BUNIONECTOMY     CARDIAC CATHETERIZATION     10/05/04 [facility]): LM < 25%, otherwise normal coronaries. No pulmonary HTN, Mildly enlarged RV. Secundum ASD s/p closure.   CARDIAC SURGERY     CATARACT EXTRACTION     CLEFT PALATE REPAIR     s/p cleft lip and palate repair   EYE SURGERY Right 2019   right eye removed   LUMBAR LAMINECTOMY/DECOMPRESSION MICRODISCECTOMY Left 05/23/2016   Procedure: LEFT L4-L5 LATERAL RECESS DECOMPRESSION WITH CENTRAL AND RIGHT DECOMPRESSION VIA LEFT SIDE;  Surgeon: Alphonso Jean, MD;  Location: [facility] OR;  Service: Orthopedics;  Laterality: Left;   LUMBAR LAMINECTOMY/DECOMPRESSION MICRODISCECTOMY Left 05/23/2016   Procedure: LUMBAR LAMINECTOMY/DECOMPRESSION MICRODISCECTOMY Lumbar five - Sacral One 1 LEVEL;  Surgeon: Alphonso Jean, MD;  Location: [facility] OR;  Service: Orthopedics;  Laterality: Left;   REDUCTION MAMMAPLASTY  2011   SHOULDER INJECTION Left 05/23/2016   Procedure: SHOULDER INJECTION;  Surgeon: Alphonso Jean, MD;  Location: [facility] OR;  Service: Orthopedics;  Laterality: Left;  band-aid per pa-c   TRANSTHORACIC ECHOCARDIOGRAM     12/15/05 (  [facility]): Mild LVH, EF > 55%, grade 1 diastolic dysfunction, Trivial MR/PR/TR.   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 (HCC) 09/13/2023   Chronic obstructive pulmonary disease, unspecified COPD type (HCC) 03/15/2023   Major depressive disorder, single episode, severe without psychotic features (HCC) 03/15/2023   Orthostatic hypotension 04/24/2022   Hyperbilirubinemia 04/24/2022   Snoring 07/14/2021   Fatigue 07/14/2021   Daytime somnolence 07/14/2021   Status post device closure of ASD 06/03/2021   Secundum ASD 06/03/2021   Decreased diffusion capacity 05/14/2021   Dyspnea 03/04/2021   Pulmonary  nodules 04/01/2020   Primary malignant neoplasm of breast with metastasis (HCC) 02/24/2020   Spondylosis without myelopathy or radiculopathy, lumbosacral region 12/31/2019   Chronic low back pain (Bilateral) w/o sciatica 12/31/2019   Chronic hip pain (3ry area of Pain) (Bilateral) (R>L) 11/27/2019   Chronic sacroiliac joint pain (Left) 11/27/2019   Chronic pain syndrome 11/11/2019   Encounter for chronic pain management 11/11/2019   Disorder of skeletal system 11/11/2019   Problems influencing health status 11/11/2019   Abnormal MRI, lumbar spine (08/22/2018) 11/11/2019   Chronic low back pain (1ry area of Pain) (Bilateral) w/ sciatica (Bilateral) 11/11/2019   DDD (degenerative disc disease), lumbosacral 11/11/2019   Grade 1 Anterolisthesis (L2-3, L3-4) 11/11/2019   Grade 1 Retrolisthesis (L4-5, L5-S1) 11/11/2019   Lumbar facet hypertrophy (Multilevel) (Bilateral) 11/11/2019   Lumbar facet syndrome (Multilevel) (Bilateral) (R>L) 11/11/2019   Chronic lower extremity pain (2ry area of Pain) (Bilateral) (R>L) 11/11/2019   Neurogenic pain 11/11/2019   Chronic musculoskeletal pain 11/11/2019   Infiltrating ductal carcinoma of breast (HCC) 10/08/2019   Fatty liver 09/04/2019   Obesity (BMI 30-39.9) 09/04/2019   GERD (gastroesophageal reflux disease)    Hyperlipidemia    CKD (chronic kidney disease), stage III (HCC)    Depression    Malignant hypertension    Ocular proptosis 02/14/2019   PVD (posterior vitreous detachment), left eye 02/14/2019   Chorioretinal scar of left eye 02/14/2019   Vitreous floaters of left eye 02/14/2019   Bilateral leg weakness 01/24/2019   Insomnia 08/02/2017   Low back pain radiating to both legs 08/02/2017   Migraine 08/02/2017   Neuropathy 08/02/2017   Spinal stenosis, 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    Class: Chronic   Left shoulder tendonitis 05/23/2016    Class: Acute   Post laminectomy syndrome 05/23/2016   Anxiety disorder  01/07/2015    ONSET DATE: 07/20/2023 (MD referral)  REFERRING DIAG:  M54.16 (ICD-10-CM) - Lumbar back pain with radiculopathy affecting left lower extremity  R26.81 (ICD-10-CM) - Unsteady gait  R29.6 (ICD-10-CM) - Frequent falls    THERAPY DIAG:  Unsteadiness on feet  Muscle weakness (generalized)  Other abnormalities of gait and mobility  Rationale for Evaluation and Treatment: Rehabilitation  SUBJECTIVE:  SUBJECTIVE STATEMENT: Monday, went to the cancer center and the area is very painful in my buttocks where I got the injection.  Have put a call in about it, but not yet heard back.  Pt accompanied by: self  PERTINENT HISTORY: Per MD:  She has been experiencing worsening back pain following a fall last month during a snowstorm. She slipped on ice and fell, initially without pain, but began experiencing consistent pain about a week later. The pain is primarily located in the lower back, radiating more to the left side, with tenderness in the buttocks and back of the thigh. On February 9th, she experienced severe pain radiating from her left foot up her leg, causing her to fall back into bed. No current pain radiating down the leg, weakness, tingling or numbness. No swelling or bruising in the affected areas. She has a history of back pain and underwent a nerve ablation procedure at the end of January to manage her symptoms.  PMH:  breast cancer with metastasis;  Lumbar laminectomy/decompression microdiscectomy (Left, 05/23/2016); Lumbar laminectomy/decompression microdiscectomy (Left, 05/23/2016); Shoulder injection (Left, 05/23/2016); Reduction mammaplasty (2011); Eye surgery (Right, 2019); Tubal ligation; and Breast lumpectomy with radioactive seed and sentinel lymph node biopsy (Right, 11/23/2020).    PAIN:  Are you having pain? Yes: NPRS scale: 2/10; 0/10 pain in hip Pain location: L ankle, R hip Pain description: sore Aggravating factors: not sure Relieving factors: not sure  PRECAUTIONS: Per Podiatry's note 11/08/23: Cam boot dispensed. WBAT Fall; decreased vision Per MD note:  She has issues with her eyes, specifically dry eyes, requiring the use of artificial tears every hour. She has a prosthetic eye (R) and uses lubricant and tapes her left eyelid shut at night to prevent dryness. Her vision can be distorted at times, affecting her daily activities.   RED FLAGS: None   WEIGHT BEARING RESTRICTIONS: No  FALLS: Has patient fallen in last 6 months? Yes. Number of falls at least 3 falls  LIVING ENVIRONMENT: Lives with: lives with their spouse Lives in: House/apartment Stairs: 6 steps, multiple steps -getting ready to move into apartment where there are no steps Has following equipment at home: Single point cane and Walker - 4 wheeled  PLOF: Independent with household mobility with device and Independent with community mobility with device  PATIENT GOALS: Pt's goals for therapy become painfree, more independent, exercise more; 10/17/2023:  better balance  OBJECTIVE:     TODAY'S TREATMENT: 11/24/2023 Activity Comments  Assisted pt in tightening Cam boot Cues for optimal positioning, tightness of Cam boot  Sit to stand 3 x 5 reps Good control  LAQ 2 x 15  Seated march 2 x 15 5#  Seated hamstring curls 15 reps Green band            Aquatic exercises Access Code: [email or web address] URL: [email or web address] Date: 11/14/2023 Prepared by: [facility] - Outpatient  Rehab - [facility]  Exercises - Forward and Backward Stepping at [facility]  - 1 x daily - 7 x weekly - 1 sets - 4 reps - Lateral Stepping at [facility] Wall  - 1 x daily - 7 x weekly - 1 sets - 4 reps - Forward March with Opposite Arm Knee Taps and Hand Floats  - 1 x daily - 7 x weekly - 1 sets - 4  reps - Side to Side Hamstring Stretch with Noodle at Pool Wall  - 1 x daily - 7 x weekly - 1 sets - 2 reps - Theme park manager  on Step  - 1 x daily - 7 x weekly - 1 sets - 10 reps - Sit to Stand Without Arm Support  - 1 x daily - 7 x weekly - 1 sets - 10 reps - Standing Hip Flexion Extension at [employer]  - 1 x daily - 7 x weekly - 1 sets - 10 reps     PATIENT EDUCATION: Education details: Reviewed/edu on importance of staying compliant with CAM boot, encouraged consistent use of RW especially while CAM boot is donned; discussed walker use for pathways around her apartment complex; verbally added LAQ, seated march and seated hamstring curls with resistance to HEP (pt states she already has these exercises) Person educated: Patient Education method: Explanation, Demonstration, Tactile cues, Verbal cues, and Handouts Education comprehension: verbalized understanding    HOME EXERCISE PROGRAM Access Code: [email or web address] URL: [email or web address] Date: 11/02/2023 Prepared by: [facility] - Outpatient  Rehab - [facility]  Exercises - Supine Posterior Pelvic Tilt  - 1 x daily - 7 x weekly - 1-2 sets - 10 reps - Supine Hamstring Stretch with Strap  - 1 x daily - 5 x weekly - 2 sets - 30 sec hold - Supine Lower Trunk Rotation  - 1 x daily - 5 x weekly - 2 sets - 20 reps - Seated Anterior Pelvic Tilt  - 1 x daily - 7 x weekly - 1-2 sets - 10 reps - Wide Tandem Stance with Eyes Open  - 1 x daily - 5 x weekly - 2 sets - 30 sec hold - Standing Toe Taps  - 1 x daily - 5 x weekly - 2 sets - 10 reps - Standing Shoulder Row with Anchored Resistance  - 1 x daily - 5 x weekly - 2 sets - 10 reps - Standing Anti-Rotation Press with Anchored Resistance  - 1 x daily - 7 x weekly - 3 sets - 10 reps - 2 sec hold - Hooklying Isometric Hip Flexion with Opposite Arm  - 1 x daily - 7 x weekly - 3 sets - 10 reps - 2 sec hold - Side Stepping with Counter Support  - 1 x daily - 7 x weekly - 1 sets - 3-5  reps - Standing Quarter Turn with Counter Support  - 1 x daily - 7 x weekly - 1 sets - 3-5 reps     ---------------------------------------------  Note: Objective measures were completed at Evaluation unless otherwise noted.  DIAGNOSTIC FINDINGS: x-rays to be completed 07/26/2023  COGNITION: Overall cognitive status: Within functional limits for tasks assessed   SENSATION: No reports of numbness   POSTURE: rounded shoulders, forward head, and posterior pelvic tilt  LOWER EXTREMITY ROM:     Active  Right Eval Left Eval  Hip flexion    Hip extension    Hip abduction    Hip adduction    Hip internal rotation    Hip external rotation    Knee flexion    Knee extension    Ankle dorsiflexion 5 5  Ankle plantarflexion    Ankle inversion    Ankle eversion     (Blank rows = not tested)  LOWER EXTREMITY MMT:    MMT Right Eval Left Eval  Hip flexion 4+ 4+  Hip extension    Hip abduction 4 4  Hip adduction 4 4  Hip internal rotation    Hip external rotation    Knee flexion 4 4  Knee extension 4+ 4+  Ankle dorsiflexion 4 4  Ankle plantarflexion    Ankle inversion    Ankle eversion    (Blank rows = not tested)  LUMBAR: Palpation:  Tender to palpation along paraspinals thoracic spine, SI joint area, bilateral buttocks/gluts/piriformis; pt tender to palpation along IT band R>L Baseline pain in standing:  0/10 Repeated standing flexion: 3/10 pain, into bilat buttocks Repeated standing extension:1-2/10 pain centralized at mid- low back Limited in lumbar extension flexibility  Hamstring flexibility from supine 90/90:  R  -15 degrees; L -20 degrees SLR test from supine:  R 85 degrees, L 80 degrees  TRANSFERS: Assistive device utilized: None BUE support Sit to stand: Modified independence Stand to sit: Modified independence   GAIT: Gait pattern: step through pattern, antalgic, lateral lean- Right, and lateral lean- Left Distance walked: 40-50 ft Assistive device  utilized: Single point cane Level of assistance: Modified independence Comments: Has rollator at home, reports less pain with gait when using rollator   PATIENT SURVEYS:  Modified Oswestry (Back Index):  48                                                                                                                                GOALS: Goals reviewed with patient? Yes  SHORT TERM GOALS: Target date: 08/25/2023  Pt will be independent with HEP for improved strength, flexibility, balance, gait. Baseline: Pt denies questions on HEP 08/23/23 Goal status: MET  08/23/23  2.  Pt will report low back pain reduced by 50% during functional daily activities. Baseline:  Reports LBP is touch and go. Reports that is it a teeny weeny bit better 08/24/23  Goal status: IN PROGRESS 08/24/23   3. Patient to score at least 46/56 on Berg in order to decrease risk of falls.  Baseline: 42 08/24/23; 52/56 09/19/23 Goal status:MET 09/19/23  4.  Pt will verbalize understanding of fall prevention and posture/body mechanics for decreased pain/decreased fall risk.  Baseline: provided today 08/23/23  Goal status: MET 08/23/23   LONG TERM GOALS: UPDATED TARGET 12/01/2023  Pt will be independent with progression of HEP (*including aquatic HEP) for improved strength, balance, gait. Baseline: interested in aquatic HEP for use of [employer] pool Goal status: IN PROGRESS 10/17/2023  2. Pt will improve 5x sit<>stand to less than or equal to 15 sec to demonstrate improved functional strength and transfer efficiency.  Baseline:  20 sec  Goal status:  INITIAL, 10/17/2023  3.  Pt will improve TUG score to less than or equal to 25 sec for decreased fall risk.. Baseline: 39 sec Goal status: INITIAL 10/17/23  4.  Pt will verbalize plans for continued community fitness upon d/c from PT to maximize gains in PT. Baseline:  educated on this today, pt reports that she has a [employer] 09/19/23 Goal status: IN PROGRESS  10/17/23  5.  Pt will improve gait velocity to at least 1.8 ft/sec for improved gait efficiency and safety.  Baseline:  1.19 ft/sec  Goal status:  INITIAL, 10/17/2023      ASSESSMENT:  CLINICAL IMPRESSION: Pt presents today with reports of some hip/buttock pain since her infusion injection on Monday. Skilled PT session focused on lower extremity strengthening exercises and reinforcement of proper donning/positioning of Cam boot. She does well with seated resisted exercises-keeps good posture and doesn't have any complaints of back pain. Discussed that she has comprehensive exercise program for exercises to address balance, low back, leg strengthening, as well as aquatics, which pt is looking into joining [employer].  Will plan to assess goals and look to discharge next visit.     OBJECTIVE IMPAIRMENTS: Abnormal gait, decreased balance, decreased mobility, difficulty walking, decreased ROM, decreased strength, impaired flexibility, postural dysfunction, and pain.   ACTIVITY LIMITATIONS: lifting, sitting, standing, stairs, transfers, and locomotion level  PARTICIPATION LIMITATIONS: community activity and exercise  PERSONAL FACTORS: 3+ comorbidities: see above are also affecting patient's functional outcome.   REHAB POTENTIAL: Good  CLINICAL DECISION MAKING: Evolving/moderate complexity  EVALUATION COMPLEXITY: Moderate  PLAN:  PT FREQUENCY: 1x/week  PT DURATION: 6 weeks per recert 10/17/2023   PLANNED INTERVENTIONS: 97110-Therapeutic exercises, 97530- Therapeutic activity, 97112- Neuromuscular re-education, 97535- Self Care, 04540- Manual therapy, [id number]- Gait training, [phone]- Aquatic Therapy, [phone]- Electrical stimulation (unattended), [phone]- Ultrasound, Patient/Family education, Balance training, DME instructions, and Moist heat  PLAN FOR NEXT SESSION: Assess LTGs and plan for discharge.   Dessie Flow, PT 11/24/23 11:58 AM Phone: [phone] Fax: [phone]   [facility]  Outpatient Rehab at [facility] [address] [place], [address] [place], [place] [address] Phone # [phone] Fax # [phone]

## 2023-11-27 ENCOUNTER — Encounter: Payer: Self-pay | Admitting: Podiatry

## 2023-11-27 ENCOUNTER — Ambulatory Visit: Admitting: Podiatry

## 2023-11-27 ENCOUNTER — Other Ambulatory Visit: Payer: Self-pay | Admitting: Hematology

## 2023-11-27 VITALS — Ht 67.0 in | Wt 204.6 lb

## 2023-11-27 DIAGNOSIS — M19072 Primary osteoarthritis, left ankle and foot: Secondary | ICD-10-CM

## 2023-11-27 MED ORDER — BETAMETHASONE SOD PHOS & ACET 6 (3-3) MG/ML IJ SUSP
3.0000 mg | Freq: Once | INTRAMUSCULAR | Status: AC
  Administered 2023-11-27: 3 mg via INTRA_ARTICULAR

## 2023-11-27 NOTE — Progress Notes (Signed)
 Chief Complaint  Patient presents with   Foot Pain    Pt is here to f/u on left foot and ankle pain, pt states her foot and ankle has still been in some pain still having slight swelling continues to wear cam boot.    Subjective:  58 y.o. female CKD, not on dialysis, presenting today for follow up evaluation of left foot and ankle pain.  She says that the injection into the left foot did help significantly.  Now she is mostly experiencing ankle pain.  Injections in the past have helped significantly.  She continues to wear the cam boot  Past Medical History:  Diagnosis Date   Acute pansinusitis 08/02/2017   Anxiety    Arthritis    ASD (atrial septal defect)    s/p closure with Amplatzer device 10/05/04 (Dr. DOROTHA Franky Minerva, [facility]) 10/05/04   Cancer [facility])    Cataract    Chronic pain    CKD (chronic kidney disease)    Dyspnea    Fatty liver 09/04/2019   GERD (gastroesophageal reflux disease)    Heart murmur    no longer heard   History of hiatal hernia    Hyperlipidemia    Legally blind in right eye, as defined in [place]     Lumbar herniated disc    Migraines    On home oxygen  therapy 06/15/2022   Pt was given O2 on D/C from [facility] 04/2022   OSA on CPAP 06/15/2022   PONV (postoperative nausea and vomiting)    Sciatica     Past Surgical History:  Procedure Laterality Date   ABLATION     BREAST LUMPECTOMY WITH RADIOACTIVE SEED AND SENTINEL LYMPH NODE BIOPSY Right 11/23/2020   Procedure: RIGHT BREAST LUMPECTOMY WITH RADIOACTIVE SEED AND RIGHT AXILLARY SENTINEL LYMPH NODE BIOPSY;  Surgeon: Ebbie Cough, MD;  Location: [facility] OR;  Service: General;  Laterality: Right;   BREAST SURGERY Bilateral 2011   Breast Reduction Surgery   BUNIONECTOMY     CARDIAC CATHETERIZATION     10/05/04 [facility]): LM < 25%, otherwise normal coronaries. No pulmonary HTN, Mildly enlarged RV. Secundum ASD s/p closure.   CARDIAC SURGERY     CATARACT EXTRACTION     CLEFT PALATE REPAIR     s/p cleft lip and  palate repair   EYE SURGERY Right 2019   right eye removed   LUMBAR LAMINECTOMY/DECOMPRESSION MICRODISCECTOMY Left 05/23/2016   Procedure: LEFT L4-L5 LATERAL RECESS DECOMPRESSION WITH CENTRAL AND RIGHT DECOMPRESSION VIA LEFT SIDE;  Surgeon: Lynwood FORBES Better, MD;  Location: [facility] OR;  Service: Orthopedics;  Laterality: Left;   LUMBAR LAMINECTOMY/DECOMPRESSION MICRODISCECTOMY Left 05/23/2016   Procedure: LUMBAR LAMINECTOMY/DECOMPRESSION MICRODISCECTOMY Lumbar five - Sacral One 1 LEVEL;  Surgeon: Lynwood FORBES Better, MD;  Location: [facility] OR;  Service: Orthopedics;  Laterality: Left;   REDUCTION MAMMAPLASTY  2011   SHOULDER INJECTION Left 05/23/2016   Procedure: SHOULDER INJECTION;  Surgeon: Lynwood FORBES Better, MD;  Location: [facility] OR;  Service: Orthopedics;  Laterality: Left;  band-aid per pa-c   TRANSTHORACIC ECHOCARDIOGRAM     12/15/05 [facility]): Mild LVH, EF > 55%, grade 1 diastolic dysfunction, Trivial MR/PR/TR.   TUBAL LIGATION      Allergies  Allergen Reactions   Zithromax [Azithromycin] Shortness Of Breath, Itching and Other (See Comments)    TOTAL BODY ITCHING [EVEN SOLES OF FEET] WHEEZING    Tramadol  Itching and Other (See Comments)    Has taken recently without any side effects.   Psyllium Nausea And Vomiting and  Other (See Comments)    Metamucil. Sneezing      Objective / Physical Exam:  General:  The patient is alert and oriented x3 in no acute distress. Dermatology:  Skin is warm, dry and supple bilateral lower extremities. Negative for open lesions or macerations. Vascular:  Palpable pedal pulses bilaterally. No edema or erythema noted. Capillary refill within normal limits. Neurological:  Light touch and protective threshold grossly intact bilaterally.  Musculoskeletal Exam:  There is no pain with palpation throughout the midfoot.  The ankle joint does demonstrate some pain and tenderness mostly to the medial aspect of the ankle.  No crepitus with range of motion.  Pes planovalgus deformity  also noted with weightbearing Radiographic exam LT foot and ankle 11/08/2023: No acute fracture identified.  Moderate degenerative changes noted throughout the midtarsal joint.  Mostly unchanged from prior x-rays  DG Ankle Complete Left 03/24/2023 IMPRESSION: 1. Mild talonavicular and navicular-cuneiform osteoarthritis. 2. Mild chronic enthesopathic change at the Achilles insertion on the calcaneus.  Assessment: 1.  Chronic arthritis/capsulitis left ankle 2.  Pes planovalgus deformity bilateral 3.  Arthritis left midtarsal joint  Plan of Care:  -Patient was evaluated. X-Rays reviewed.  -Injection of 0.5 mL Celestone  Soluspan injected in the left ankle medial aspect -Patient may now transition out of the cam boot.  Recommend good supportive tennis shoes and sneakers -History of CKD.  No NSAIDs -Return to clinic PRN   Thresa EMERSON Sar, DPM [facility]  Dr. Thresa EMERSON Sar, DPM    [address] [place], [place] [address]                Office [phone]  Fax [phone]

## 2023-11-28 ENCOUNTER — Ambulatory Visit: Admitting: Physical Therapy

## 2023-11-28 ENCOUNTER — Encounter: Payer: Self-pay | Admitting: Physical Therapy

## 2023-11-28 DIAGNOSIS — R2681 Unsteadiness on feet: Secondary | ICD-10-CM | POA: Diagnosis not present

## 2023-11-28 DIAGNOSIS — R2689 Other abnormalities of gait and mobility: Secondary | ICD-10-CM

## 2023-11-28 DIAGNOSIS — M5459 Other low back pain: Secondary | ICD-10-CM | POA: Diagnosis not present

## 2023-11-28 DIAGNOSIS — M6281 Muscle weakness (generalized): Secondary | ICD-10-CM

## 2023-11-28 NOTE — Therapy (Signed)
 OUTPATIENT PHYSICAL THERAPY NEURO NOTE/DISCHARGE SUMMARY  Patient Name: Brandy [name] [name]  MRN: [id number] DOB:September 22, 1965, 58 y.o., female Today's Date: 11/28/2023   PCP: Leonarda Roxan BROCKS, NP  REFERRING PROVIDER: Ngetich, Dinah C, NP   PHYSICAL THERAPY DISCHARGE SUMMARY  Visits from Start of Care: 18  Current functional level related to goals / functional outcomes: Pt has met 4 of 5 LTGs-see below   Remaining deficits: Balance, strength-improving   Education / Equipment: Educated in [place], pool HEP, community fitness, fall prevention   Patient agrees to discharge. Patient goals were met. Patient is being discharged due to meeting the stated rehab goals.   END OF SESSION:  PT End of Session - 11/28/23 0932     Visit Number 18    Number of Visits 19    Date for PT Re-Evaluation 12/01/23   per recert 10/17/2023   Authorization Type [employer] Medicare-submitted for reauth after 10/17/2023 visit    Authorization Time Period approved 6 visits from 10/17/23-11/28/23   new auth submitted   Authorization - Visit Number 6    Authorization - Number of Visits 6    PT Start Time 0935    PT Stop Time 1013    PT Time Calculation (min) 38 min    Activity Tolerance Patient tolerated treatment well    Behavior During Therapy [facility] for tasks assessed/performed                       Past Medical History:  Diagnosis Date   Acute pansinusitis 08/02/2017   Anxiety    Arthritis    ASD (atrial septal defect)    s/p closure with Amplatzer device 10/05/04 (Dr. DOROTHA Franky Minerva, [facility]) 10/05/04   Cancer [facility])    Cataract    Chronic pain    CKD (chronic kidney disease)    Dyspnea    Fatty liver 09/04/2019   GERD (gastroesophageal reflux disease)    Heart murmur    no longer heard   History of hiatal hernia    Hyperlipidemia    Legally blind in right eye, as defined in [place]     Lumbar herniated disc    Migraines    On home oxygen  therapy 06/15/2022   Pt was given O2 on D/C  from [facility] 04/2022   OSA on CPAP 06/15/2022   PONV (postoperative nausea and vomiting)    Sciatica    Past Surgical History:  Procedure Laterality Date   ABLATION     BREAST LUMPECTOMY WITH RADIOACTIVE SEED AND SENTINEL LYMPH NODE BIOPSY Right 11/23/2020   Procedure: RIGHT BREAST LUMPECTOMY WITH RADIOACTIVE SEED AND RIGHT AXILLARY SENTINEL LYMPH NODE BIOPSY;  Surgeon: Ebbie Cough, MD;  Location: [facility] OR;  Service: General;  Laterality: Right;   BREAST SURGERY Bilateral 2011   Breast Reduction Surgery   BUNIONECTOMY     CARDIAC CATHETERIZATION     10/05/04 [facility]): LM < 25%, otherwise normal coronaries. No pulmonary HTN, Mildly enlarged RV. Secundum ASD s/p closure.   CARDIAC SURGERY     CATARACT EXTRACTION     CLEFT PALATE REPAIR     s/p cleft lip and palate repair   EYE SURGERY Right 2019   right eye removed   LUMBAR LAMINECTOMY/DECOMPRESSION MICRODISCECTOMY Left 05/23/2016   Procedure: LEFT L4-L5 LATERAL RECESS DECOMPRESSION WITH CENTRAL AND RIGHT DECOMPRESSION VIA LEFT SIDE;  Surgeon: Lynwood FORBES Better, MD;  Location: [facility] OR;  Service: Orthopedics;  Laterality: Left;   LUMBAR LAMINECTOMY/DECOMPRESSION MICRODISCECTOMY Left 05/23/2016   Procedure:  LUMBAR LAMINECTOMY/DECOMPRESSION MICRODISCECTOMY Lumbar five - Sacral One 1 LEVEL;  Surgeon: Lynwood FORBES Better, MD;  Location: [facility] OR;  Service: Orthopedics;  Laterality: Left;   REDUCTION MAMMAPLASTY  2011   SHOULDER INJECTION Left 05/23/2016   Procedure: SHOULDER INJECTION;  Surgeon: Lynwood FORBES Better, MD;  Location: [facility] OR;  Service: Orthopedics;  Laterality: Left;  band-aid per pa-c   TRANSTHORACIC ECHOCARDIOGRAM     12/15/05 [facility]): Mild LVH, EF > 55%, grade 1 diastolic dysfunction, Trivial MR/PR/TR.   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 (HCC) 09/13/2023   Chronic obstructive  pulmonary disease, unspecified COPD type (HCC) 03/15/2023   Major depressive disorder, single episode, severe without psychotic features (HCC) 03/15/2023   Orthostatic hypotension 04/24/2022   Hyperbilirubinemia 04/24/2022   Snoring 07/14/2021   Fatigue 07/14/2021   Daytime somnolence 07/14/2021   Status post device closure of ASD 06/03/2021   Secundum ASD 06/03/2021   Decreased diffusion capacity 05/14/2021   Dyspnea 03/04/2021   Pulmonary nodules 04/01/2020   Primary malignant neoplasm of breast with metastasis (HCC) 02/24/2020   Spondylosis without myelopathy or radiculopathy, lumbosacral region 12/31/2019   Chronic low back pain (Bilateral) w/o sciatica 12/31/2019   Chronic hip pain (3ry area of Pain) (Bilateral) (R>L) 11/27/2019   Chronic sacroiliac joint pain (Left) 11/27/2019   Chronic pain syndrome 11/11/2019   Encounter for chronic pain management 11/11/2019   Disorder of skeletal system 11/11/2019   Problems influencing health status 11/11/2019   Abnormal MRI, lumbar spine (08/22/2018) 11/11/2019   Chronic low back pain (1ry area of Pain) (Bilateral) w/ sciatica (Bilateral) 11/11/2019   DDD (degenerative disc disease), lumbosacral 11/11/2019   Grade 1 Anterolisthesis (L2-3, L3-4) 11/11/2019   Grade 1 Retrolisthesis (L4-5, L5-S1) 11/11/2019   Lumbar facet hypertrophy (Multilevel) (Bilateral) 11/11/2019   Lumbar facet syndrome (Multilevel) (Bilateral) (R>L) 11/11/2019   Chronic lower extremity pain (2ry area of Pain) (Bilateral) (R>L) 11/11/2019   Neurogenic pain 11/11/2019   Chronic musculoskeletal pain 11/11/2019   Infiltrating ductal carcinoma of breast (HCC) 10/08/2019   Fatty liver 09/04/2019   Obesity (BMI 30-39.9) 09/04/2019   GERD (gastroesophageal reflux disease)    Hyperlipidemia    CKD (chronic kidney disease), stage III (HCC)    Depression    Malignant hypertension    Ocular proptosis 02/14/2019   PVD (posterior vitreous detachment), left eye 02/14/2019    Chorioretinal scar of left eye 02/14/2019   Vitreous floaters of left eye 02/14/2019   Bilateral leg weakness 01/24/2019   Insomnia 08/02/2017   Low back pain radiating to both legs 08/02/2017   Migraine 08/02/2017   Neuropathy 08/02/2017   Spinal stenosis, 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    Class: Chronic   Left shoulder tendonitis 05/23/2016    Class: Acute   Post laminectomy syndrome 05/23/2016   Anxiety disorder 01/07/2015    ONSET DATE: 07/20/2023 (MD referral)  REFERRING DIAG:  M54.16 (ICD-10-CM) - Lumbar back pain with radiculopathy affecting left lower extremity  R26.81 (ICD-10-CM) - Unsteady gait  R29.6 (ICD-10-CM) - Frequent falls    THERAPY DIAG:  Unsteadiness on feet  Muscle weakness (generalized)  Other abnormalities of gait and mobility  Rationale for Evaluation and Treatment: Rehabilitation  SUBJECTIVE:  SUBJECTIVE STATEMENT: [facility] [employer] and I plan to do the Silver Sneakers and for the pool for water aerobics.  Went to foot doctor yesterday and he gave me a cortisone shot-said to use the CAM boot as I needed.  Pt accompanied by: self  PERTINENT HISTORY: Per MD:  She has been experiencing worsening back pain following a fall last month during a snowstorm. She slipped on ice and fell, initially without pain, but began experiencing consistent pain about a week later. The pain is primarily located in the lower back, radiating more to the left side, with tenderness in the buttocks and back of the thigh. On February 9th, she experienced severe pain radiating from her left foot up her leg, causing her to fall back into bed. No current pain radiating down the leg, weakness, tingling or numbness. No swelling or bruising in the affected areas. She has a  history of back pain and underwent a nerve ablation procedure at the end of January to manage her symptoms.  PMH:  breast cancer with metastasis;  Lumbar laminectomy/decompression microdiscectomy (Left, 05/23/2016); Lumbar laminectomy/decompression microdiscectomy (Left, 05/23/2016); Shoulder injection (Left, 05/23/2016); Reduction mammaplasty (2011); Eye surgery (Right, 2019); Tubal ligation; and Breast lumpectomy with radioactive seed and sentinel lymph node biopsy (Right, 11/23/2020).   PAIN:  Are you having pain? Yes: NPRS scale: 2/10; 0/10 pain in hip Pain location: L ankle, R hip Pain description: sore Aggravating factors: not sure Relieving factors: not sure  PRECAUTIONS: Per Podiatry's note 11/08/23: Cam boot dispensed. WBAT Fall; decreased vision Per MD note:  She has issues with her eyes, specifically dry eyes, requiring the use of artificial tears every hour. She has a prosthetic eye (R) and uses lubricant and tapes her left eyelid shut at night to prevent dryness. Her vision can be distorted at times, affecting her daily activities.   RED FLAGS: None   WEIGHT BEARING RESTRICTIONS: No  FALLS: Has patient fallen in last 6 months? Yes. Number of falls at least 3 falls  LIVING ENVIRONMENT: Lives with: lives with their spouse Lives in: House/apartment Stairs: 6 steps, multiple steps -getting ready to move into apartment where there are no steps Has following equipment at home: Single point cane and Walker - 4 wheeled  PLOF: Independent with household mobility with device and Independent with community mobility with device  PATIENT GOALS: Pt's goals for therapy become painfree, more independent, exercise more; 10/17/2023:  better balance  OBJECTIVE:    TODAY'S TREATMENT: 11/28/2023 Activity Comments  FTSTS:  16.25 sec Improved from 20 sec  TUG:  14.38 sec Improved from 39 sec  72M walk:17.25 sec (1.9 ft/sec) Improved from 1.19 ft/sec  Review HEP additions: Seated march,  LAQ, hamstring curls Green band-good return Interior and spatial designer exercises Access Code: [email or web address] URL: [email or web address] Date: 11/14/2023 Prepared by: [facility] - Outpatient  Rehab - [facility]  Exercises - Forward and Backward Stepping at [facility]  - 1 x daily - 7 x weekly - 1 sets - 4 reps - Lateral Stepping at [facility] Wall  - 1 x daily - 7 x weekly - 1 sets - 4 reps - Forward March with Opposite Arm Knee Taps and Hand Floats  - 1 x daily - 7 x weekly - 1 sets - 4 reps - Side to Side Hamstring Stretch with Noodle at Pool Wall  - 1 x daily - 7 x weekly - 1 sets - 2 reps -  Gastroc Stretch on Step  - 1 x daily - 7 x weekly - 1 sets - 10 reps - Sit to Stand Without Arm Support  - 1 x daily - 7 x weekly - 1 sets - 10 reps - Standing Hip Flexion Extension at [employer]  - 1 x daily - 7 x weekly - 1 sets - 10 reps     PATIENT EDUCATION: Education details: Discussed POC and progress towards goals; discussed plans to transition to [employer] for community fitness Person educated: Patient Education method: Explanation, Demonstration, Tactile cues, Verbal cues, and Handouts Education comprehension: verbalized understanding    HOME EXERCISE PROGRAM Access Code: [email or web address] URL: [email or web address] Date: 11/02/2023 Prepared by: [facility] - Outpatient  Rehab - [facility]  Exercises - Supine Posterior Pelvic Tilt  - 1 x daily - 7 x weekly - 1-2 sets - 10 reps - Supine Hamstring Stretch with Strap  - 1 x daily - 5 x weekly - 2 sets - 30 sec hold - Supine Lower Trunk Rotation  - 1 x daily - 5 x weekly - 2 sets - 20 reps - Seated Anterior Pelvic Tilt  - 1 x daily - 7 x weekly - 1-2 sets - 10 reps - Wide Tandem Stance with Eyes Open  - 1 x daily - 5 x weekly - 2 sets - 30 sec hold - Standing Toe Taps  - 1 x daily - 5 x weekly - 2 sets - 10 reps - Standing Shoulder Row with Anchored Resistance  - 1 x daily - 5 x weekly - 2 sets - 10 reps - Standing  Anti-Rotation Press with Anchored Resistance  - 1 x daily - 7 x weekly - 3 sets - 10 reps - 2 sec hold - Hooklying Isometric Hip Flexion with Opposite Arm  - 1 x daily - 7 x weekly - 3 sets - 10 reps - 2 sec hold - Side Stepping with Counter Support  - 1 x daily - 7 x weekly - 1 sets - 3-5 reps - Standing Quarter Turn with Counter Support  - 1 x daily - 7 x weekly - 1 sets - 3-5 reps     ---------------------------------------------  Note: Objective measures were completed at Evaluation unless otherwise noted.  DIAGNOSTIC FINDINGS: x-rays to be completed 07/26/2023  COGNITION: Overall cognitive status: Within functional limits for tasks assessed   SENSATION: No reports of numbness   POSTURE: rounded shoulders, forward head, and posterior pelvic tilt  LOWER EXTREMITY ROM:     Active  Right Eval Left Eval  Hip flexion    Hip extension    Hip abduction    Hip adduction    Hip internal rotation    Hip external rotation    Knee flexion    Knee extension    Ankle dorsiflexion 5 5  Ankle plantarflexion    Ankle inversion    Ankle eversion     (Blank rows = not tested)  LOWER EXTREMITY MMT:    MMT Right Eval Left Eval  Hip flexion 4+ 4+  Hip extension    Hip abduction 4 4  Hip adduction 4 4  Hip internal rotation    Hip external rotation    Knee flexion 4 4  Knee extension 4+ 4+  Ankle dorsiflexion 4 4  Ankle plantarflexion    Ankle inversion    Ankle eversion    (Blank rows = not tested)  LUMBAR: Palpation:  Tender to palpation along paraspinals thoracic spine,  SI joint area, bilateral buttocks/gluts/piriformis; pt tender to palpation along IT band R>L Baseline pain in standing:  0/10 Repeated standing flexion: 3/10 pain, into bilat buttocks Repeated standing extension:1-2/10 pain centralized at mid- low back Limited in lumbar extension flexibility  Hamstring flexibility from supine 90/90:  R  -15 degrees; L -20 degrees SLR test from supine:  R 85 degrees,  L 80 degrees  TRANSFERS: Assistive device utilized: None BUE support Sit to stand: Modified independence Stand to sit: Modified independence   GAIT: Gait pattern: step through pattern, antalgic, lateral lean- Right, and lateral lean- Left Distance walked: 40-50 ft Assistive device utilized: Single point cane Level of assistance: Modified independence Comments: Has rollator at home, reports less pain with gait when using rollator   PATIENT SURVEYS:  Modified Oswestry (Back Index):  48                                                                                                                                GOALS: Goals reviewed with patient? Yes  SHORT TERM GOALS: Target date: 08/25/2023  Pt will be independent with HEP for improved strength, flexibility, balance, gait. Baseline: Pt denies questions on HEP 08/23/23 Goal status: MET  08/23/23  2.  Pt will report low back pain reduced by 50% during functional daily activities. Baseline:  Reports LBP is touch and go. Reports that is it a teeny weeny bit better 08/24/23  Goal status: IN PROGRESS 08/24/23   3. Patient to score at least 46/56 on Berg in order to decrease risk of falls.  Baseline: 42 08/24/23; 52/56 09/19/23 Goal status:MET 09/19/23  4.  Pt will verbalize understanding of fall prevention and posture/body mechanics for decreased pain/decreased fall risk.  Baseline: provided today 08/23/23  Goal status: MET 08/23/23   LONG TERM GOALS: UPDATED TARGET 12/01/2023  Pt will be independent with progression of HEP (*including aquatic HEP) for improved strength, balance, gait. Baseline: interested in aquatic HEP for use of [employer] pool Goal status: MET, 11/28/2023  2. Pt will improve 5x sit<>stand to less than or equal to 15 sec to demonstrate improved functional strength and transfer efficiency.  Baseline:  20 sec> 16.53 sec   Goal status:  PARTIALLY MET, 11/28/2023  3.  Pt will improve TUG score to less than or equal to  25 sec for decreased fall risk.. Baseline: 39 sec> 14.38 sec no device 11/28/2023 Goal status: MET, 11/28/2023  4.  Pt will verbalize plans for continued community fitness upon d/c from PT to maximize gains in PT. Baseline:  educated on this today, pt reports that she has a [employer] 09/19/23 Goal status: MET, 11/28/2023  5.  Pt will improve gait velocity to at least 1.8 ft/sec for improved gait efficiency and safety.  Baseline:  1.9 ft/sec  Goal status:  MET, 11/28/2023      ASSESSMENT:  CLINICAL IMPRESSION: Pt presents today with reports of feeling so much better after cortisone  shot yesterday. Skilled PT session focused on assessing goals today, with pt meeting 4 of 5 LTGs.  She has improved functional measures with FTSTS, TUG and gait velocity scores.  She has made plans to join [facility] and to continue her HEP.  She is appropriate for discharge from PT at this time.      OBJECTIVE IMPAIRMENTS: Abnormal gait, decreased balance, decreased mobility, difficulty walking, decreased ROM, decreased strength, impaired flexibility, postural dysfunction, and pain.   ACTIVITY LIMITATIONS: lifting, sitting, standing, stairs, transfers, and locomotion level  PARTICIPATION LIMITATIONS: community activity and exercise  PERSONAL FACTORS: 3+ comorbidities: see above are also affecting patient's functional outcome.   REHAB POTENTIAL: Good  CLINICAL DECISION MAKING: Evolving/moderate complexity  EVALUATION COMPLEXITY: Moderate  PLAN:  PT FREQUENCY: 1x/week  PT DURATION: 6 weeks per recert 10/17/2023   PLANNED INTERVENTIONS: 97110-Therapeutic exercises, 97530- Therapeutic activity, 97112- Neuromuscular re-education, 97535- Self Care, 02859- Manual therapy, [id number]- Gait training, [phone]- Aquatic Therapy, [phone]- Electrical stimulation (unattended), 02964- Ultrasound, Patient/Family education, Balance training, DME instructions, and Moist heat  PLAN FOR NEXT SESSION: Discharge PT this  visit.   Greig Anon, PT 11/28/23 10:09 AM Phone: [phone] Fax: [phone]   [facility] at [facility] [address] [place], [address] [place], [place] [address] Phone # [phone] Fax # [phone]

## 2023-11-29 ENCOUNTER — Ambulatory Visit
Admission: RE | Admit: 2023-11-29 | Discharge: 2023-11-29 | Discharge status: Home / Self Care | Source: Ambulatory Visit | Attending: Hematology

## 2023-11-29 DIAGNOSIS — Z853 Personal history of malignant neoplasm of breast: Secondary | ICD-10-CM

## 2023-11-29 DIAGNOSIS — N644 Mastodynia: Secondary | ICD-10-CM | POA: Diagnosis not present

## 2023-11-29 DIAGNOSIS — Z9889 Other specified postprocedural states: Secondary | ICD-10-CM

## 2023-12-04 ENCOUNTER — Telehealth: Payer: Self-pay | Admitting: Licensed Clinical Social Worker

## 2023-12-04 ENCOUNTER — Other Ambulatory Visit (HOSPITAL_COMMUNITY): Payer: Self-pay | Admitting: Student

## 2023-12-04 ENCOUNTER — Encounter: Payer: Self-pay | Admitting: Licensed Clinical Social Worker

## 2023-12-04 DIAGNOSIS — H0520 Unspecified exophthalmos: Secondary | ICD-10-CM | POA: Diagnosis not present

## 2023-12-04 DIAGNOSIS — H02536 Eyelid retraction left eye, unspecified eyelid: Secondary | ICD-10-CM | POA: Diagnosis not present

## 2023-12-05 ENCOUNTER — Telehealth: Payer: Self-pay

## 2023-12-05 NOTE — Progress Notes (Unsigned)
 Complex Care Management Care [name] Note  12/05/2023 Name: Brandy [name]  MRN: [id number] DOB: 1966/03/24  Brandy [name]  is a 58 y.o. year old female who is a primary care patient of Ngetich, Dinah C, NP and is actively engaged with the care management team. I reached out to Brandy [name]  by phone today to assist with re-scheduling  with the Licensed Clinical Child psychotherapist.  Follow up plan: Unsuccessful telephone outreach attempt made. A HIPAA compliant phone message was left for the patient providing contact information and requesting a return call.  Brandy [name], Brandy [name]  Direct Dial: [phone]  Fax [phone]

## 2023-12-06 ENCOUNTER — Encounter (HOSPITAL_COMMUNITY): Payer: Self-pay

## 2023-12-06 ENCOUNTER — Ambulatory Visit (INDEPENDENT_AMBULATORY_CARE_PROVIDER_SITE_OTHER): Admitting: Podiatry

## 2023-12-06 ENCOUNTER — Ambulatory Visit (HOSPITAL_COMMUNITY)
Admission: RE | Admit: 2023-12-06 | Discharge: 2023-12-06 | Disposition: A | Discharge status: Home / Self Care | Source: Ambulatory Visit | Attending: Hematology | Admitting: Hematology

## 2023-12-06 ENCOUNTER — Ambulatory Visit (HOSPITAL_COMMUNITY)
Admission: RE | Admit: 2023-12-06 | Discharge: 2023-12-06 | Disposition: A | Discharge status: Home / Self Care | Source: Ambulatory Visit

## 2023-12-06 DIAGNOSIS — Z91198 Patient's noncompliance with other medical treatment and regimen for other reason: Secondary | ICD-10-CM

## 2023-12-06 DIAGNOSIS — C50919 Malignant neoplasm of unspecified site of unspecified female breast: Secondary | ICD-10-CM | POA: Diagnosis not present

## 2023-12-06 DIAGNOSIS — Z452 Encounter for adjustment and management of vascular access device: Secondary | ICD-10-CM | POA: Diagnosis not present

## 2023-12-06 HISTORY — PX: IR IMAGING GUIDED PORT INSERTION: IMG5740

## 2023-12-06 MED ORDER — MIDAZOLAM HCL 2 MG/2ML IJ SOLN
INTRAMUSCULAR | Status: AC
  Filled 2023-12-06: qty 2

## 2023-12-06 MED ORDER — HEPARIN SOD (PORK) LOCK FLUSH 100 UNIT/ML IV SOLN
INTRAVENOUS | Status: AC
  Filled 2023-12-06: qty 5

## 2023-12-06 MED ORDER — FENTANYL CITRATE (PF) 100 MCG/2ML IJ SOLN
INTRAMUSCULAR | Status: AC | PRN
  Administered 2023-12-06 (×2): 50 ug via INTRAVENOUS

## 2023-12-06 MED ORDER — MIDAZOLAM HCL 2 MG/2ML IJ SOLN
INTRAMUSCULAR | Status: AC | PRN
  Administered 2023-12-06: 1 mg via INTRAVENOUS

## 2023-12-06 MED ORDER — MIDAZOLAM HCL 2 MG/2ML IJ SOLN
INTRAMUSCULAR | Status: AC | PRN
  Administered 2023-12-06: 2 mg via INTRAVENOUS

## 2023-12-06 MED ORDER — LIDOCAINE-EPINEPHRINE 1 %-1:100000 IJ SOLN
20.0000 mL | Freq: Once | INTRAMUSCULAR | Status: AC
  Administered 2023-12-06: 10 mL via INTRADERMAL

## 2023-12-06 MED ORDER — HEPARIN SOD (PORK) LOCK FLUSH 100 UNIT/ML IV SOLN
500.0000 [IU] | Freq: Once | INTRAVENOUS | Status: AC
  Administered 2023-12-06: 500 [IU] via INTRAVENOUS

## 2023-12-06 MED ORDER — LIDOCAINE HCL 1 % IJ SOLN
INTRAMUSCULAR | Status: AC
  Filled 2023-12-06: qty 20

## 2023-12-06 MED ORDER — FENTANYL CITRATE (PF) 100 MCG/2ML IJ SOLN
INTRAMUSCULAR | Status: AC
  Filled 2023-12-06: qty 2

## 2023-12-06 MED ORDER — FENTANYL CITRATE (PF) 100 MCG/2ML IJ SOLN
INTRAMUSCULAR | Status: AC | PRN
  Administered 2023-12-06: 50 ug via INTRAVENOUS

## 2023-12-06 MED ORDER — LIDOCAINE HCL 1 % IJ SOLN
20.0000 mL | Freq: Once | INTRAMUSCULAR | Status: AC
  Administered 2023-12-06: 15 mL via INTRADERMAL

## 2023-12-06 MED ORDER — SODIUM CHLORIDE 0.9 % IV SOLN: INTRAVENOUS | Status: DC

## 2023-12-06 MED ORDER — LIDOCAINE-EPINEPHRINE 1 %-1:100000 IJ SOLN
INTRAMUSCULAR | Status: AC
Start: 2023-12-06 — End: 2023-12-06
  Filled 2023-12-06: qty 1

## 2023-12-06 NOTE — Sedation Documentation (Addendum)
 RN Shirelle Tootle pulled 4 mg Versed  and 200 mcg Fentanyl  in Ir room pysix. Pt. Received 4 mg Versed  and 200 mcg Fentanyl  throughout the procedure.

## 2023-12-06 NOTE — Progress Notes (Signed)
 Complex Care Management Care Guide Note  12/06/2023 Name: Brandy [name]  MRN: [id number] DOB: 06-14-1965  Brandy [name]  is a 58 y.o. year old female who is a primary care patient of Ngetich, Dinah C, NP and is actively engaged with the care management team. I reached out to Brandy [name]  by phone today to assist with re-scheduling  with the Licensed Clinical Child psychotherapist.  Follow up plan: Telephone appointment with complex care management team member scheduled for:  12/21/23 @ 11AM.  Leotis [facility] Guide  Direct Dial: [phone]  Fax [phone]

## 2023-12-06 NOTE — H&P (Signed)
 Chief Complaint: Patient was seen in consultation today for port placement  Referring Physician(s): Kale,Gautam Kishore  Supervising Physician: Philip Cornet  Patient Status: [facility] - Out-pt  History of Present Illness: Brandy [name] [name]  is a 57 y.o. female with breast cancer. She also has poor venous access and requires frequent lab draws and infusions.  She is referred for port placement.  PMHx, meds, labs, imaging, allergies reviewed. Feels well, no recent fevers, chills, illness. Has been NPO today as directed. Family at bedside.   Past Medical History:  Diagnosis Date   Acute pansinusitis 08/02/2017   Anxiety    Arthritis    ASD (atrial septal defect)    s/p closure with Amplatzer device 10/05/04 (Dr. DOROTHA Franky Minerva, [facility]) 10/05/04   Cancer [facility])    Cataract    Chronic pain    CKD (chronic kidney disease)    Dyspnea    Fatty liver 09/04/2019   GERD (gastroesophageal reflux disease)    Heart murmur    no longer heard   History of hiatal hernia    Hyperlipidemia    Legally blind in right eye, as defined in [place]     Lumbar herniated disc    Migraines    On home oxygen  therapy 06/15/2022   Pt was given O2 on D/C from [facility] 04/2022   OSA on CPAP 06/15/2022   PONV (postoperative nausea and vomiting)    Sciatica     Past Surgical History:  Procedure Laterality Date   ABLATION     BREAST LUMPECTOMY WITH RADIOACTIVE SEED AND SENTINEL LYMPH NODE BIOPSY Right 11/23/2020   Procedure: RIGHT BREAST LUMPECTOMY WITH RADIOACTIVE SEED AND RIGHT AXILLARY SENTINEL LYMPH NODE BIOPSY;  Surgeon: Ebbie Cough, MD;  Location: [facility] OR;  Service: General;  Laterality: Right;   BREAST SURGERY Bilateral 2011   Breast Reduction Surgery   BUNIONECTOMY     CARDIAC CATHETERIZATION     10/05/04 [facility]): LM < 25%, otherwise normal coronaries. No pulmonary HTN, Mildly enlarged RV. Secundum ASD s/p closure.   CARDIAC SURGERY     CATARACT EXTRACTION     CLEFT PALATE REPAIR      s/p cleft lip and palate repair   EYE SURGERY Right 2019   right eye removed   LUMBAR LAMINECTOMY/DECOMPRESSION MICRODISCECTOMY Left 05/23/2016   Procedure: LEFT L4-L5 LATERAL RECESS DECOMPRESSION WITH CENTRAL AND RIGHT DECOMPRESSION VIA LEFT SIDE;  Surgeon: Lynwood FORBES Better, MD;  Location: [facility] OR;  Service: Orthopedics;  Laterality: Left;   LUMBAR LAMINECTOMY/DECOMPRESSION MICRODISCECTOMY Left 05/23/2016   Procedure: LUMBAR LAMINECTOMY/DECOMPRESSION MICRODISCECTOMY Lumbar five - Sacral One 1 LEVEL;  Surgeon: Lynwood FORBES Better, MD;  Location: [facility] OR;  Service: Orthopedics;  Laterality: Left;   REDUCTION MAMMAPLASTY  2011   SHOULDER INJECTION Left 05/23/2016   Procedure: SHOULDER INJECTION;  Surgeon: Lynwood FORBES Better, MD;  Location: [facility] OR;  Service: Orthopedics;  Laterality: Left;  band-aid per pa-c   TRANSTHORACIC ECHOCARDIOGRAM     12/15/05 [facility]): Mild LVH, EF > 55%, grade 1 diastolic dysfunction, Trivial MR/PR/TR.   TUBAL LIGATION      Allergies: Zithromax [azithromycin], Tramadol , and Psyllium  Medications: Prior to Admission medications   Medication Sig Start Date End Date Taking? Authorizing Provider  acetaminophen  (TYLENOL ) 500 MG tablet Take 500 mg by mouth daily as needed for moderate pain.    [provider]  albuterol  (VENTOLIN  HFA) 108 (90 Base) MCG/ACT inhaler Inhale 2 puffs into the lungs every 6 (six) hours as needed for wheezing or shortness  of breath. 06/16/22   Kassie Acquanetta Bradley, MD  ALPRAZolam  (XANAX ) 1 MG tablet TAKE 1 TABLET BY MOUTH AT  BEDTIME AS NEEDED FOR ANXIETY 08/11/23   Ngetich, Dinah C, NP  amitriptyline  (ELAVIL ) 75 MG tablet Take 1 tablet (75 mg total) by mouth at bedtime. 03/15/23   Ngetich, Dinah C, NP  amoxicillin -clavulanate (AUGMENTIN ) 875-125 MG tablet Take 1 tablet by mouth 2 (two) times daily. 11/01/23   Sherlynn Madden, MD  Carboxymethylcellul-Glycerin (LUBRICATING EYE DROPS OP) Place 1 drop into the right eye in the morning, at noon, and at bedtime.     [provider]  cyanocobalamin  (VITAMIN B12) 500 MCG tablet Take 1 tablet (500 mcg total) by mouth daily. 07/30/22   Jillian Buttery, MD  Evolocumab  (REPATHA  SURECLICK) 140 MG/ML SOAJ Inject 140 mg into the skin every 14 (fourteen) days. 06/26/23   Tobb, Kardie, DO  fluticasone -salmeterol (ADVAIR) 250-50 MCG/ACT AEPB Inhale 1 puff into the lungs in the morning and at bedtime. 07/03/23   Kassie Acquanetta Bradley, MD  ketoconazole (NIZORAL) 2 % cream Apply 1 Application topically 2 (two) times daily. 02/08/23   [provider]  loperamide  (IMODIUM ) 2 MG capsule Take 1-2 capsules (2-4 mg total) by mouth 4 (four) times daily as needed for diarrhea or loose stools. 05/10/21   Onesimo Emaline Brink, MD  magnesium  30 MG tablet Take 30 mg by mouth 2 (two) times daily.    [provider]  methocarbamol  (ROBAXIN ) 500 MG tablet Take 1 tablet (500 mg total) by mouth every 6 (six) hours as needed for muscle spasms. 01/10/22   Nitka, James E, MD  midodrine  (PROAMATINE ) 10 MG tablet TAKE 1 TABLET BY MOUTH IN THE  MORNING AND AT BEDTIME 07/03/23   Ngetich, Dinah C, NP  Multiple Vitamin (MULTIVITAMIN WITH MINERALS) TABS tablet Take 1 tablet by mouth daily.    [provider]  omeprazole  (PRILOSEC  OTC) 20 MG tablet Take 20 mg by mouth daily.    [provider]  PRESCRIPTION MEDICATION CPAP- At bedtime    [provider]  triamcinolone  cream (KENALOG ) 0.1 % Apply 1 Application topically 2 (two) times daily. 06/09/22   Medina-Vargas, Monina C, NP  VERZENIO  100 MG tablet TAKE 1 TABLET BY MOUTH TWICE DAILY 11/15/23   Onesimo Emaline Brink, MD  Vitamin D , Ergocalciferol , (DRISDOL ) 1.25 MG (50000 UNIT) CAPS capsule TAKE 1 CAPSULE BY MOUTH EVERY 7  DAYS ( MONDAY ) 04/10/23   Ngetich, Roxan BROCKS, NP     Family History  Problem Relation Age of Onset   Arthritis Mother    Hypertension Mother    Diabetes Mother    High blood pressure Mother    Cancer Father        Lung   Colon cancer Neg Hx     Colon polyps Neg Hx    Esophageal cancer Neg Hx    Rectal cancer Neg Hx    Stomach cancer Neg Hx     Social History   Socioeconomic History   Marital status: Married    Spouse name: Charles [name]    Number of children: 3   Years of education: 14   Highest education level: Associate degree: occupational, Scientist, product/process development, or vocational program  Occupational History   Not on file  Tobacco Use   Smoking status: Former    Current packs/day: 0.00    Average packs/day: 0.3 packs/day for 25.0 years (6.3 ttl pk-yrs)    Types: Cigarettes    Start date: 02/04/1993  Quit date: 02/04/2018    Years since quitting: 5.8   Smokeless tobacco: Never  Vaping Use   Vaping status: Never Used  Substance and Sexual Activity   Alcohol  use: No   Drug use: No   Sexual activity: Not Currently  Other Topics Concern   Not on file  Social History Narrative   Tobacco use, amount per day now: None.   Past tobacco use, amount per day: 1/4   How many years did you use tobacco: Intermittent x 20 years   Alcohol  use (drinks per week): N/A   Diet: Plant Base   Do you drink/eat things with caffeine: Coffee, Tea, Soda.   Marital status:    Married                              What year were you married? 1999   Do you live in a house, apartment, assisted living, condo, trailer, etc.? House    Is it one or more stories? 2   How many persons live in your home? 6 adults, 5 children.   Do you have pets in your home?( please list) 1 Terrier   Highest Level of education completed? AAS   Current or past profession: LPN   Do you exercise?   A little                               Type and how often? Walk, stretches.    Do you have a living will? No   Do you have a DNR form?   No                                If not, do you want to discuss one?   Do you have signed POA/HPOA forms? No                       If so, please bring to you appointment      Do you have any difficulty bathing or dressing yourself? No   Do  you have any difficulty preparing food or eating? No   Do you have any difficulty managing your medications? No   Do you have any difficulty managing your finances? No   Do you have any difficulty affording your medications? No.   Social Drivers of Health   Financial Resource Strain: Low Risk  (10/18/2023)   Overall Financial Resource Strain (CARDIA)    Difficulty of Paying Living Expenses: Not hard at all  Food Insecurity: No Food Insecurity (10/18/2023)   Hunger Vital Sign    Worried About Running Out of Food in the Last Year: Never true    Ran Out of Food in the Last Year: Never true  Recent Concern: Food Insecurity - Food Insecurity Present (09/13/2023)   Hunger Vital Sign    Worried About Running Out of Food in the Last Year: Often true    Ran Out of Food in the Last Year: Sometimes true  Transportation Needs: No Transportation Needs (10/18/2023)   PRAPARE - Administrator, Civil Service (Medical): No    Lack of Transportation (Non-Medical): No  Physical Activity: Insufficiently Active (10/18/2023)   Exercise Vital Sign    Days of Exercise per Week: 7 days    Minutes of Exercise per Session:  20 min  Stress: No Stress Concern Present (10/18/2023)   [employer] of Occupational Health - Occupational Stress Questionnaire    Feeling of Stress : Not at all  Social Connections: Moderately Integrated (09/13/2023)   Social Connection and Isolation Panel    Frequency of Communication with Friends and Family: More than three times a week    Frequency of Social Gatherings with Friends and Family: Once a week    Attends Religious Services: More than 4 times per year    Active Member of [employer] or Organizations: Yes    Attends Banker Meetings: More than 4 times per year    Marital Status: Widowed    Review of Systems: A 12 point ROS discussed and pertinent positives are indicated in the HPI above.  All other systems are negative.  Review of Systems  Vital  Signs: BP (!) 142/74   Pulse 98   Temp 98.8 F (37.1 C) (Oral)   SpO2 92% Comment: 100 with deep breaths  Physical Exam Constitutional:      Appearance: She is not ill-appearing.  HENT:     Mouth/Throat:     Mouth: Mucous membranes are moist.     Pharynx: Oropharynx is clear.  Cardiovascular:     Rate and Rhythm: Normal rate and regular rhythm.     Heart sounds: Normal heart sounds.  Pulmonary:     Effort: Pulmonary effort is normal. No respiratory distress.     Breath sounds: Normal breath sounds.  Neurological:     General: No focal deficit present.     Mental Status: She is alert and oriented to person, place, and time.  Psychiatric:        Mood and Affect: Mood normal.        Thought Content: Thought content normal.     Imaging: MM 3D DIAGNOSTIC MAMMOGRAM BILATERAL BREAST Result Date: 11/29/2023 CLINICAL DATA:  58 year old female presents with diffuse intermittent RIGHT breast pain. History of RIGHT breast cancer and lumpectomy in 2022. EXAM: DIGITAL DIAGNOSTIC BILATERAL MAMMOGRAM WITH TOMOSYNTHESIS AND CAD TECHNIQUE: Bilateral digital diagnostic mammography and breast tomosynthesis was performed. The images were evaluated with computer-aided detection. COMPARISON:  Previous exam(s). ACR Breast Density Category b: There are scattered areas of fibroglandular density. FINDINGS: Full field views of both breasts and a magnification view of the lumpectomy site demonstrate no suspicious mass, nonsurgical distortion or worrisome calcifications. RIGHT lumpectomy changes with surgical and biopsy clips in this area again noted. No new changes are present. IMPRESSION: No new or suspicious findings within either breast. RIGHT lumpectomy changes again noted. RECOMMENDATION: Per protocol, as the patient is now 2 or more years status post lumpectomy, she may return to annual screening mammography in 1 year. However, given the history of breast cancer, the patient remains eligible for annual  diagnostic mammography if preferred. (Code:SM-B-01Y) Recommend clinical follow-up as indicated. Any further workup should be based on clinical grounds. I have discussed the findings and recommendations with the patient. If applicable, a reminder letter will be sent to the patient regarding the next appointment. BI-RADS CATEGORY  2: Benign. Electronically Signed   By: Reyes Phi M.D.   On: 11/29/2023 11:08   DG Foot Complete Left Result Date: 11/08/2023 Please see detailed radiograph report in office note.   Labs:  CBC: Recent Labs    09/25/23 1333 10/09/23 1108 10/23/23 1344 11/20/23 1330  WBC 4.4 3.8 4.5 4.3  HGB 12.4 12.1 11.3* 12.1  HCT 37.0 36.1 33.9* 35.8*  PLT  266 199 204 194    COAGS: No results for input(s): INR, APTT in the last 8760 hours.  BMP: Recent Labs    08/28/23 1352 09/13/23 1012 09/25/23 1333 10/09/23 1108 10/23/23 1344 11/20/23 1330  NA 143   < > 140 141 141 140  K 3.8   < > 4.4 4.2 4.1 4.1  CL 105   < > 103 100 105 104  CO2 31   < > 31 33* 29 30  GLUCOSE 104*   < > 78 86 76 87  BUN 11   < > 18 12 23* 11  CALCIUM  9.4   < > 9.8 9.5 9.6 9.4  CREATININE 1.19*   < > 1.24* 1.33* 1.51* 1.20*  GFRNONAA 53*  --  50*  --  40* 52*   < > = values in this interval not displayed.    LIVER FUNCTION TESTS: Recent Labs    08/28/23 1352 09/13/23 1012 09/25/23 1333 10/23/23 1344 11/20/23 1330  BILITOT 0.5 0.7  0.7 0.8 0.9 0.8  AST 90* 59*  59* 54* 69* 69*  ALT 88* 51*  51* 44 54* 92*  ALKPHOS 272*  --  241* 227* 239*  PROT 7.0 6.7  6.7 7.4 7.6 7.1  ALBUMIN  3.6  --  3.9 4.0 3.7     Assessment and Plan: Breast cancer Poor venous access For port placement Risks and benefits of image guided port-a-catheter placement was discussed with the patient including, but not limited to bleeding, infection, pneumothorax, or fibrin sheath development and need for additional procedures.  All of the patient's questions were answered, patient is agreeable to  proceed. Consent signed and in chart.    Electronically Signed: Franky Rusk, PA-C 12/06/2023, 12:47 PM   I spent a total of 20 minutes in face to face in clinical consultation, greater than 50% of which was counseling/coordinating care for port

## 2023-12-06 NOTE — Procedures (Signed)
Interventional Radiology Procedure:   Indications: Poor venous access  Procedure: Port placement  Findings: Right jugular port, tip at SVC/RA junction  Complications: None     EBL: Minimal, less than 10 ml  Plan: Discharge in one hour.  Keep port site and incisions dry for at least 24 hours.     Cay Kath R. Zarai Orsborn, MD  Pager: [phone]   

## 2023-12-06 NOTE — Discharge Instructions (Signed)
 Implanted Port Insertion, Care After  The following information offers guidance on how to care for yourself after your procedure. Your health care provider may also give you more specific instructions. If you have problems or questions, contact your health care provider.  What can I expect after the procedure? After the procedure, it is common to have: Discomfort at the port insertion site. Bruising on the skin over the port. This should improve over 3-4 days.   Urgent needs - Interventional Radiology, clinic [phone] (mon-fri 8-5).   Wound - May remove dressing and shower in 24 to 48 hours.  Keep site clean and dry.  Replace with bandaid as needed.  Do not submerge in tub or water until site healing well. If closed with glue, glue will flake off on its own.   If ordered by your provider, may start Emla cream (or any other creams ointments or lotions) in 2 weeks or after incision is healed. Port is ready for use immediately.   After completion of treatment, your provider should have you set up for monthly port flushes.   Follow these instructions at home: [facility] care After your port is placed, you will get a manufacturer's information card. The card has information about your port. Keep this card with you at all times. Take care of the port as told by your health care provider. Ask your health care provider if you or a family member can get training for taking care of the port at home. A home health care nurse will be be available to help care for the port. Make sure to remember what type of port you have. Incision care     Follow instructions from your health care provider about how to take care of your port insertion site. Make sure you: Wash your hands with soap and water for at least 20 seconds before and after you change your bandage (dressing). If soap and water are not available, use hand sanitizer. Change your dressing as told by your health care provider. Leave stitches  (sutures), skin glue, or adhesive strips in place. These skin closures may need to stay in place for 2 weeks or longer. If adhesive strip edges start to loosen and curl up, you may trim the loose edges. Do not remove adhesive strips completely unless your health care provider tells you to do that. Check your port insertion site every day for signs of infection. Check for: Redness, swelling, or pain. Fluid or blood. Warmth. Pus or a bad smell. Activity Return to your normal activities as told by your health care provider. Ask your health care provider what activities are safe for you. You may have to avoid lifting. Ask your health care provider how much you can safely lift. General instructions Take over-the-counter and prescription medicines only as told by your health care provider. Do not take baths, swim, or use a hot tub until your health care provider approves. Ask your health care provider if you may take showers. You may only be allowed to take sponge baths. If you were given a sedative during the procedure, it can affect you for several hours. Do not drive or operate machinery until your health care provider says that it is safe. Wear a medical alert bracelet in case of an emergency. This will tell any health care providers that you have a port. Keep all follow-up visits. This is important. Contact a health care provider if: You cannot flush your port with saline as directed, or you cannot  draw blood from the port. You have a fever or chills. You have redness, swelling, or pain around your port insertion site. You have fluid or blood coming from your port insertion site. Your port insertion site feels warm to the touch. You have pus or a bad smell coming from the port insertion site. Get help right away if: You have chest pain or shortness of breath. You have bleeding from your port that you cannot control. These symptoms may be an emergency. Get help right away. Call 911. Do not  wait to see if the symptoms will go away. Do not drive yourself to the hospital. Summary Take care of the port as told by your health care provider. Keep the manufacturer's information card with you at all times. Change your dressing as told by your health care provider. Contact a health care provider if you have a fever or chills or if you have redness, swelling, or pain around your port insertion site. Keep all follow-up visits. This information is not intended to replace advice given to you by your health care provider. Make sure you discuss any questions you have with your health care provider. Document Revised: 11/24/2020 Document Reviewed: 11/24/2020 Elsevier Patient Education  2023 Elsevier Inc.    Moderate Conscious Sedation  Adult  Care After (English)  After the procedure, it is common to have: Sleepiness for a few hours. Impaired judgment for a few hours. Trouble with balance. Nausea or vomiting if you eat too soon. Follow these instructions at home: For the time period you were told by your health care provider:  Rest. Do not participate in activities where you could fall or become injured. Do not drive or use machinery. Do not drink alcohol. Do not take sleeping pills or medicines that cause drowsiness. Do not make important decisions or sign legal documents. Do not take care of children on your own. Eating and drinking Follow instructions from your health care provider about what you may eat and drink. Drink enough fluid to keep your urine pale yellow. If you vomit: Drink clear fluids slowly and in small amounts as you are able. Clear fluids include water, ice chips, low-calorie sports drinks, and fruit juice that has water added to it (diluted fruit juice). Eat light and bland foods in small amounts as you are able. These foods include bananas, applesauce, rice, lean meats, toast, and crackers. General instructions Take over-the-counter and prescription medicines  only as told by your health care provider. Have a responsible adult stay with you for the time you are told. Do not use any products that contain nicotine or tobacco. These products include cigarettes, chewing tobacco, and vaping devices, such as e-cigarettes. If you need help quitting, ask your health care provider. Return to your normal activities as told by your health care provider. Ask your health care provider what activities are safe for you. Your health care provider may give you more instructions. Make sure you know what you can and cannot do. Contact a health care provider if: You are still sleepy or having trouble with balance after 24 hours. You feel light-headed. You vomit every time you eat or drink. You get a rash. You have a fever. You have redness or swelling around the IV site. Get help right away if: You have trouble breathing. You start to feel confused at home. These symptoms may be an emergency. Get help right away. Call 911. Do not wait to see if the symptoms will go away. Do not  drive yourself to the hospital. This information is not intended to replace advice given to you by your health care provider. Make sure you discuss any questions you have with your health care provider.

## 2023-12-07 NOTE — Progress Notes (Signed)
 1. Failure to attend appointment with reason given    Patient in hospital.

## 2023-12-08 ENCOUNTER — Other Ambulatory Visit: Payer: Self-pay | Admitting: Hematology

## 2023-12-08 DIAGNOSIS — C50919 Malignant neoplasm of unspecified site of unspecified female breast: Secondary | ICD-10-CM

## 2023-12-11 ENCOUNTER — Encounter: Payer: Self-pay | Admitting: Hematology

## 2023-12-11 DIAGNOSIS — H179 Unspecified corneal scar and opacity: Secondary | ICD-10-CM | POA: Diagnosis not present

## 2023-12-11 DIAGNOSIS — H0222B Mechanical lagophthalmos left eye, upper and lower eyelids: Secondary | ICD-10-CM | POA: Diagnosis not present

## 2023-12-11 DIAGNOSIS — H04122 Dry eye syndrome of left lacrimal gland: Secondary | ICD-10-CM | POA: Diagnosis not present

## 2023-12-11 DIAGNOSIS — H16212 Exposure keratoconjunctivitis, left eye: Secondary | ICD-10-CM | POA: Diagnosis not present

## 2023-12-14 ENCOUNTER — Ambulatory Visit (HOSPITAL_BASED_OUTPATIENT_CLINIC_OR_DEPARTMENT_OTHER): Admitting: Nurse Practitioner

## 2023-12-14 DIAGNOSIS — G894 Chronic pain syndrome: Secondary | ICD-10-CM

## 2023-12-14 DIAGNOSIS — Z91199 Patient's noncompliance with other medical treatment and regimen due to unspecified reason: Secondary | ICD-10-CM

## 2023-12-14 NOTE — Progress Notes (Signed)
 12/14/2023 - No show

## 2023-12-18 ENCOUNTER — Inpatient Hospital Stay: Payer: Medicare Other

## 2023-12-18 ENCOUNTER — Inpatient Hospital Stay

## 2023-12-20 ENCOUNTER — Ambulatory Visit: Attending: Nurse Practitioner | Admitting: Nurse Practitioner

## 2023-12-20 ENCOUNTER — Encounter: Payer: Self-pay | Admitting: Nurse Practitioner

## 2023-12-20 VITALS — BP 153/73 | Temp 97.3°F | Resp 18 | Ht 67.0 in | Wt 200.0 lb

## 2023-12-20 DIAGNOSIS — M961 Postlaminectomy syndrome, not elsewhere classified: Secondary | ICD-10-CM | POA: Diagnosis not present

## 2023-12-20 DIAGNOSIS — M47817 Spondylosis without myelopathy or radiculopathy, lumbosacral region: Secondary | ICD-10-CM | POA: Insufficient documentation

## 2023-12-20 DIAGNOSIS — G894 Chronic pain syndrome: Secondary | ICD-10-CM | POA: Insufficient documentation

## 2023-12-20 DIAGNOSIS — M4310 Spondylolisthesis, site unspecified: Secondary | ICD-10-CM | POA: Insufficient documentation

## 2023-12-20 DIAGNOSIS — G8929 Other chronic pain: Secondary | ICD-10-CM | POA: Insufficient documentation

## 2023-12-20 DIAGNOSIS — M47816 Spondylosis without myelopathy or radiculopathy, lumbar region: Secondary | ICD-10-CM | POA: Diagnosis not present

## 2023-12-20 DIAGNOSIS — M4317 Spondylolisthesis, lumbosacral region: Secondary | ICD-10-CM

## 2023-12-20 MED ORDER — HYDROCODONE-ACETAMINOPHEN 5-325 MG PO TABS: 1.0000 | ORAL_TABLET | Freq: Two times a day (BID) | ORAL | 0 refills | Status: AC | PRN

## 2023-12-20 MED ORDER — HYDROCODONE-ACETAMINOPHEN 5-325 MG PO TABS
1.0000 | ORAL_TABLET | Freq: Two times a day (BID) | ORAL | 0 refills | Status: AC | PRN
Start: 2024-01-19 — End: 2024-02-18

## 2023-12-20 MED ORDER — HYDROCODONE-ACETAMINOPHEN 5-325 MG PO TABS: 1.0000 | ORAL_TABLET | Freq: Two times a day (BID) | ORAL | 0 refills | Status: DC | PRN

## 2023-12-20 MED ORDER — BELBUCA 300 MCG BU FILM: 1.0000 | ORAL_FILM | Freq: Two times a day (BID) | BUCCAL | 2 refills | Status: DC | PRN

## 2023-12-20 NOTE — Progress Notes (Signed)
 Nursing Pain Medication Assessment:  Safety precautions to be maintained throughout the outpatient stay will include: orient to surroundings, keep bed in low position, maintain call bell within reach at all times, provide assistance with transfer out of bed and ambulation.  Medication Inspection Compliance: Pill count conducted under aseptic conditions, in front of the patient. Neither the pills nor the bottle was removed from the patient's sight at any time. Once count was completed pills were immediately returned to the patient in their original bottle.  Medication: Belbuca  HCL Pill/Patch Count: 20 of 60 pills/patches remain Pill/Patch Appearance: Markings consistent with prescribed medication Bottle Appearance: Standard pharmacy container. Clearly labeled. Filled Date: 06 / 18 / 2025 Last Medication intake:  Today

## 2023-12-20 NOTE — Progress Notes (Signed)
 PROVIDER NOTE: Interpretation of information contained herein should be left to medically-trained personnel. Specific patient instructions are provided elsewhere under Patient Instructions section of medical record. This document was created in part using AI and STT-dictation technology, any transcriptional errors that may result from this process are unintentional.  Patient: Brandy [name]   Service: E/M   PCP: Leonarda Roxan BROCKS, NP  DOB: Nov 17, 1965  DOS: 12/20/2023  Provider: Emmy MARLA Blanch, NP  MRN: [id number]  Delivery: Face-to-face  Specialty: Interventional Pain Management  Type: Established Patient  Setting: Ambulatory outpatient facility  Specialty designation: 09  Referring Prov.: Ngetich, Roxan BROCKS, NP  Location: Outpatient office facility       History of present illness (HPI) Brandy [name] , a 58 y.o. year old female, is here today because of her Lumbar facet joint syndrome [M47.816]. [name] 's primary complain today is Back Pain (Lower)  Pertinent problems: [name]  has Spinal stenosis, Lumbar region with neurogenic claudication; Post laminectomy syndrome; CKD (chronic kidney disease), stage III (HCC); Depression; DDD (degenerative disc disease), lumbosacral; Grade 1 retrolithiasis (L4-5, L5-S1); Lumbar facet syndrome (Multilevel) (Bilateral) (R>L); Neurogenic pain; spondylosis without myelopathy or radiculopathy, lumbosacral region, and Chronic pain syndrome on their pertinent problem list.  Pain Assessment: Severity of Chronic pain is reported as a 7/10. Location: Back Mid, Lower/Radiates down to saccrum. Onset: More than a month ago. Quality: Aching, Pressure. Timing: Intermittent. Modifying factor(s): Medications, Rest. Vitals:  height is 5' 7 (1.702 m) and weight is 200 lb (90.7 kg). Her temporal temperature is 97.3 F (36.3 C) (abnormal). Her blood pressure is 153/73 (abnormal). Her respiration is 18 and oxygen  saturation is  98%.  BMI: Estimated body mass index is 31.32 kg/m as calculated from the following:   Height as of this encounter: 5' 7 (1.702 m).   Weight as of this encounter: 200 lb (90.7 kg).  Last encounter: 12/14/2023. Last procedure: Visit date not found.  Reason for encounter: evaluation for possible interventional PM therapy/treatment and medication management. No change in medical history since last visit.  Patient's pain is at baseline.  Patient continues multimodal pain regimen as prescribed.  States that it provides pain relief and improvement in functional status.  The patient has previously experienced significant relief from lumbar radiofrequency ablation (RFA) for her lower back and leg pain, with approximately 7 months of symptoms improvement following her last lumbar spinal ablation.  She is currently experiencing a flareup of her pain and is open to repeating the RFA procedure as a treatment option.  Pharmacotherapy Assessment   Hydrocodone  - acetaminophen  5-325 mg TID for 30 days for break through pain. MME=10 Buprenorphine  HCl (BELBUCA ) 300 mcg Film BID.   Monitoring: [place] PMP: PDMP reviewed during this encounter.       Pharmacotherapy: No side-effects or adverse reactions reported. Compliance: No problems identified. Effectiveness: Clinically acceptable.  Brandy [name], [place]  12/20/2023  8:18 AM  Sign when Signing Visit Nursing Pain Medication Assessment:  Safety precautions to be maintained throughout the outpatient stay will include: orient to surroundings, keep bed in low position, maintain call bell within reach at all times, provide assistance with transfer out of bed and ambulation.  Medication Inspection Compliance: Pill count conducted under aseptic conditions, in front of the patient. Neither the pills nor the bottle was removed from the patient's sight at any time. Once count was completed pills were immediately returned to the patient in their original bottle.  Medication:  Belbuca  HCL Pill/Patch Count:  20 of 60 pills/patches remain Pill/Patch Appearance: Markings consistent with prescribed medication Bottle Appearance: Standard pharmacy container. Clearly labeled. Filled Date: 06 / 18 / 2025 Last Medication intake:  Today  Brandy [name], CMA  12/20/2023  8:16 AM  Sign when Signing Visit Nursing Pain Medication Assessment:  Safety precautions to be maintained throughout the outpatient stay will include: orient to surroundings, keep bed in low position, maintain call bell within reach at all times, provide assistance with transfer out of bed and ambulation.  Medication Inspection Compliance: Pill count conducted under aseptic conditions, in front of the patient. Neither the pills nor the bottle was removed from the patient's sight at any time. Once count was completed pills were immediately returned to the patient in their original bottle.  Medication: Hydrocodone /APAP Pill/Patch Count: 40 of 90 pills/patches remain Pill/Patch Appearance: Markings consistent with prescribed medication Bottle Appearance: Standard pharmacy container. Clearly labeled. Filled Date: 04 / 16 / 2025 Last Medication intake:  Today    UDS:  Summary  Date Value Ref Range Status  09/12/2023 FINAL  Final    Comment:    ==================================================================== [employer] Select 13 (MW) ==================================================================== Specimen Alert Not Detected result may be consistent with the time of last use noted for this medication. AS NEEDED (Hydrocodone ) ==================================================================== Test                             Result       Flag       Units  Drug Present and Declared for Prescription Verification   Alpha-hydroxyalprazolam        162          EXPECTED   ng/mg creat    Alpha-hydroxyalprazolam is an expected metabolite of alprazolam .    Source of alprazolam  is a scheduled prescription  medication.    Buprenorphine                   27           EXPECTED   ng/mg creat   Norbuprenorphine               47           EXPECTED   ng/mg creat    Source of buprenorphine  is a scheduled prescription medication.    Norbuprenorphine is an expected metabolite of buprenorphine .  Drug Absent but Declared for Prescription Verification   Hydrocodone                     Not Detected UNEXPECTED ng/mg creat ==================================================================== Test                      Result    Flag   Units      Ref Range   Creatinine              78               mg/dL      >=79 ==================================================================== Declared Medications:  The flagging and interpretation on this report are based on the  following declared medications.  Unexpected results may arise from  inaccuracies in the declared medications.   **Note: The testing scope of this panel includes these medications:   Alprazolam  (Xanax )  Hydrocodone  (Norco)   **Note: The testing scope of this panel does not include small to  moderate amounts of these reported medications:   Buprenorphine  (Belbuca )   **Note: The  testing scope of this panel does not include the  following reported medications:   Abemaciclib  (Verzenio )  Acetaminophen  (Tylenol )  Acetaminophen  (Norco)  Albuterol  (Ventolin  HFA)  Amitriptyline  (Elavil )  Evolocumab  (Repatha )  Eye Drops  Fluticasone  (Advair)  Ketoconazole (Nizoral)  Loperamide  (Imodium )  Methocarbamol  (Robaxin )  Midodrine  (Proamatine )  Multivitamin  Omeprazole  (Prilosec )  Salmeterol (Advair)  Triamcinolone  (Kenalog )  Vitamin B12  Vitamin D2 (Drisdol ) ==================================================================== For clinical consultation, please call [phone]. ====================================================================     No results found for: CBDTHCR No results found for: D8THCCBX No results found for:  D9THCCBX  ROS  Constitutional: Denies any fever or chills Gastrointestinal: No reported hemesis, hematochezia, vomiting, or acute GI distress Musculoskeletal: Denies any acute onset joint swelling, redness, loss of ROM, or weakness Neurological: No reported episodes of acute onset apraxia, aphasia, dysarthria, agnosia, amnesia, paralysis, loss of coordination, or loss of consciousness  Medication Review  ALPRAZolam , Buprenorphine  HCl, Evolocumab , HYDROcodone -acetaminophen , PRESCRIPTION MEDICATION, Polyethyl Glycol-Propyl Glycol, Vitamin D  (Ergocalciferol ), abemaciclib , acetaminophen , albuterol , amitriptyline , amoxicillin -clavulanate, cyanocobalamin , fluticasone -salmeterol, ketoconazole, loperamide , magnesium , methocarbamol , midodrine , multivitamin with minerals, omeprazole , and triamcinolone  cream  History Review  Allergy: [name]  is allergic to zithromax [azithromycin], tramadol , and psyllium. Drug: [name]   reports no history of drug use. Alcohol :  reports no history of alcohol  use. Tobacco:  reports that she quit smoking about 5 years ago. Her smoking use included cigarettes. She started smoking about 30 years ago. She has a 6.3 pack-year smoking history. She has never used smokeless tobacco. Social: [name]   reports that she quit smoking about 5 years ago. Her smoking use included cigarettes. She started smoking about 30 years ago. She has a 6.3 pack-year smoking history. She has never used smokeless tobacco. She reports that she does not drink alcohol  and does not use drugs. Medical:  has a past medical history of Acute pansinusitis (08/02/2017), Anxiety, Arthritis, ASD (atrial septal defect), Cancer (HCC), Cataract, Chronic pain, CKD (chronic kidney disease), Dyspnea, Fatty liver (09/04/2019), GERD (gastroesophageal reflux disease), Heart murmur, History of hiatal hernia, Hyperlipidemia, Legally blind in right eye, as defined in [place] , Lumbar  herniated disc, Migraines, On home oxygen  therapy (06/15/2022), OSA on CPAP (06/15/2022), PONV (postoperative nausea and vomiting), and Sciatica. Surgical: [name]   has a past surgical history that includes Breast surgery (Bilateral, 2011); Cataract extraction; Bunionectomy; Cleft palate repair; Cardiac surgery; Ablation; transthoracic echocardiogram; Cardiac catheterization; Lumbar laminectomy/decompression microdiscectomy (Left, 05/23/2016); Lumbar laminectomy/decompression microdiscectomy (Left, 05/23/2016); Shoulder injection (Left, 05/23/2016); Reduction mammaplasty (2011); Eye surgery (Right, 2019); Tubal ligation; Breast lumpectomy with radioactive seed and sentinel lymph node biopsy (Right, 11/23/2020); and IR IMAGING GUIDED PORT INSERTION (12/06/2023). Family: family history includes Arthritis in her mother; Cancer in her father; Diabetes in her mother; High blood pressure in her mother; Hypertension in her mother.  Laboratory Chemistry Profile   Renal Lab Results  Component Value Date   BUN 11 11/20/2023   CREATININE 1.20 (H) 11/20/2023   LABCREA 123.4 10/27/2021   BCR 9 10/09/2023   GFRAA >60 02/24/2020   GFRNONAA 52 (L) 11/20/2023    Hepatic Lab Results  Component Value Date   AST 69 (H) 11/20/2023   ALT 92 (H) 11/20/2023   ALBUMIN  3.7 11/20/2023   ALKPHOS 239 (H) 11/20/2023   LIPASE 35 07/12/2021    Electrolytes Lab Results  Component Value Date   NA 140 11/20/2023   K 4.1 11/20/2023   CL 104 11/20/2023   CALCIUM  9.4 11/20/2023   MG 1.1 (L) 09/13/2023   PHOS 2.6  04/26/2022    Bone Lab Results  Component Value Date   VD25OH 30.51 06/26/2019   VD125OH2TOT 62 05/28/2021   CI6874NY7 <8 05/28/2021   [id number] 62 05/28/2021   25OHVITD1 50 11/27/2019   25OHVITD2 46 11/27/2019   25OHVITD3 4.1 11/27/2019    Inflammation (CRP: Acute Phase) (ESR: Chronic Phase) Lab Results  Component Value Date   CRP 5.6 (H) 07/28/2022   ESRSEDRATE 60 (H) 03/23/2023    LATICACIDVEN 1.5 07/23/2022         Note: Above Lab results reviewed.  Recent Imaging Review  IR IMAGING GUIDED PORT INSERTION INDICATION: 58 year old with history of breast cancer. Port-A-Cath needed due to poor venous access.  EXAM: FLUOROSCOPIC AND ULTRASOUND GUIDED PLACEMENT OF A SUBCUTANEOUS PORT  MEDICATIONS: Moderate sedation  ANESTHESIA/SEDATION: Moderate (conscious) sedation was employed during this procedure. A total of Versed  4 mg and fentanyl  200 mcg was administered intravenously at the order of the provider performing the procedure.  Total intra-service moderate sedation time: 28 minutes.  Patient's level of consciousness and vital signs were monitored continuously by radiology nurse throughout the procedure under the supervision of the provider performing the procedure.  FLUOROSCOPY TIME:  Radiation Exposure Index (as provided by the fluoroscopic device): 5 mGy Kerma  COMPLICATIONS: None immediate.  PROCEDURE: The procedure, risks, benefits, and alternatives were explained to the patient. Questions regarding the procedure were encouraged and answered. The patient understands and consents to the procedure.  Patient was placed supine on the interventional table. Ultrasound confirmed a patent right internal jugular vein. Ultrasound image was saved for documentation. The right chest and neck were cleaned with a skin antiseptic and a sterile drape was placed. Maximal barrier sterile technique was utilized including caps, mask, sterile gowns, sterile gloves, sterile drape, hand hygiene and skin antiseptic. The right neck was anesthetized with 1% lidocaine . Small incision was made in the right neck with a blade. Micropuncture set was placed in the right internal jugular vein with ultrasound guidance. The micropuncture wire was used for measurement purposes. The right chest was anesthetized with 1% lidocaine  with epinephrine . #15 blade was used to make an  incision and a subcutaneous port pocket was formed. 8 french Power Port was assembled. Subcutaneous tunnel was formed with a stiff tunneling device. The port catheter was brought through the subcutaneous tunnel. The port was placed in the subcutaneous pocket. The micropuncture set was exchanged for a peel-away sheath. The catheter was placed through the peel-away sheath and the tip was positioned at the superior cavoatrial junction. Catheter placement was confirmed with fluoroscopy. The port was accessed and flushed with heparinized saline. The port pocket was closed using two layers of absorbable sutures and Dermabond. The vein skin site was closed using a single layer of absorbable suture and Dermabond. Sterile dressings were applied. Patient tolerated the procedure well without an immediate complication. Ultrasound and fluoroscopic images were taken and saved for this procedure.  IMPRESSION: Placement of a subcutaneous power-injectable port device. Catheter tip at the superior cavoatrial junction.  Electronically Signed   By: Juliene Balder M.D.   On: 12/06/2023 14:40 Note: Reviewed        MR Lumbar Spine w/o contrast   Narrative CLINICAL DATA:  Spinal stenosis, lumbar. Low back pain radiating to bilateral sides/legs with numbness and weakness in bilateral legs for years. History of lumbar spine laminectomy in 2017.   EXAM: MRI LUMBAR SPINE WITHOUT CONTRAST   TECHNIQUE: Multiplanar, multisequence MR imaging of the lumbar spine was performed. No intravenous contrast  was administered.   COMPARISON:  MR lumbar 01/13/2017; X-ray lumbar 08/26/2021.   FINDINGS: Segmentation:  5 lumbar vertebra   Alignment: 4 mm anterolisthesis L2-3 is unchanged. 2 mm anterolisthesis L3-4 unchanged. 4 mm retrolisthesis L4-5 and L5-S1 also unchanged.   Vertebrae: Negative for fracture or mass. Normal appearing bone marrow.   Conus medullaris and cauda equina: Conus extends to the L2-3  level. Conus and cauda equina appear normal.   Paraspinal and other soft tissues: Negative for paraspinous mass, adenopathy, fluid collection   Disc levels:   L1-2: Minimal disc degeneration. Moderate to advanced facet degeneration. Interval development of posterior epidural synovial cyst measuring 7 x 8 mm. This flattens the posterior thecal sac but is not causing significant spinal stenosis. Generous size spinal canal.   L2-3: Anterolisthesis. Moderate to advanced facet degeneration bilaterally. No significant stenosis   L3-4: Mild anterolisthesis. Shallow right foraminal disc protrusion unchanged from the prior study. Mild disc degeneration and moderate facet degeneration. Negative for stenosis.   L4-5: Left laminotomy. Diffuse bulging of the disc and mild facet degeneration. Mild left subarticular stenosis, unchanged. Spinal canal adequate in size   L5-S1: Left laminotomy. Retrolisthesis with disc degeneration and spurring. Bilateral facet degeneration. Mild subarticular stenosis bilaterally. No interval change.   IMPRESSION: Postop laminectomy left L4-5 and L5-S1. No recurrent disc protrusion. Subarticular stenosis left L4-5 unchanged. Subarticular stenosis bilaterally L5-S1 unchanged   Multilevel facet degeneration. Interval development of 7 x 8 mm synovial cyst at L1-2 projecting into the posterior epidural space in the midline. This is not causing significant spinal stenosis.     Electronically Signed By: Carlin Gaskins M.D. On: 09/29/2021 10:49  Physical Exam  Vitals: BP (!) 153/73 (BP Location: Left Arm, Patient Position: Sitting, Cuff Size: Normal)   Temp (!) 97.3 F (36.3 C) (Temporal)   Resp 18   Ht 5' 7 (1.702 m)   Wt 200 lb (90.7 kg)   SpO2 98%   BMI 31.32 kg/m  BMI: Estimated body mass index is 31.32 kg/m as calculated from the following:   Height as of this encounter: 5' 7 (1.702 m).   Weight as of this encounter: 200 lb (90.7 kg). Ideal:  Ideal body weight: 61.6 kg (135 lb 12.9 oz) Adjusted ideal body weight: 73.2 kg (161 lb 7.7 oz) General appearance: Well nourished, well developed, and well hydrated. In no apparent acute distress Mental status: Alert, oriented x 3 (person, place, & time)       Respiratory: No evidence of acute respiratory distress Eyes: PERLA  Lumbar Exam  Skin & Axial Inspection: Well healed scar from previous spine surgery detected Alignment: Symmetrical Functional ROM: Pain restricted ROM       Stability: No instability detected Muscle Tone/Strength: Functionally intact. No obvious neuro-muscular anomalies detected. Sensory (Neurological): facet medicated  Palpation: No palpable anomalies        Mild pain with lumbar extension and facet loading   Ambulation: Unassisted Gait: Antalgic Posture: Difficulty standing up straight, due to pain  Lower Extremity Exam      Side: Right lower extremity   Side: Left lower extremity  Stability: No instability observed           Stability: No instability observed          Skin & Extremity Inspection: Skin color, temperature, and hair growth are WNL. No peripheral edema or cyanosis. No masses, redness, swelling, asymmetry, or associated skin lesions. No contractures.   Skin & Extremity Inspection: Skin color, temperature, and hair  growth are WNL. No peripheral edema or cyanosis. No masses, redness, swelling, asymmetry, or associated skin lesions. No contractures.  Functional ROM: Pain restricted ROM for hip and knee joints           Functional ROM: Pain restricted ROM for hip and knee joints          Muscle Tone/Strength: Functionally intact. No obvious neuro-muscular anomalies detected.   Muscle Tone/Strength: Functionally intact. No obvious neuro-muscular anomalies detected.  Sensory (Neurological): Unimpaired         Sensory (Neurological): Unimpaired        DTR: Patellar: deferred today Achilles: deferred today Plantar: deferred today   DTR: Patellar:  deferred today Achilles: deferred today Plantar: deferred today  Palpation: No palpable anomalies   Palpation: No palpable anomalies    Assessment   Diagnosis Status  1. Lumbar facet syndrome (Multilevel) (Bilateral) (R>L)   2. Lumbar facet hypertrophy (Multilevel) (Bilateral)   3. Chronic pain syndrome   4. Failed back surgical syndrome   5. Grade 1 Retrolisthesis (L4-5, L5-S1)   6. Encounter for chronic pain management   7. Spondylosis without myelopathy or radiculopathy, lumbosacral region    Having a Flare-up Having a Flare-up Controlled   Updated Problems: No problems updated.  Plan of Care  Problem-specific:  Assessment and Plan We will continue on current medication regimen.  Prescribing drug monitoring (PDMP) reviewed; findings consistent with the use of prescribed medication and no evidence of narcotic misuse or abuse.  Urine drug screening (UDS) up-to-date.  Schedule follow-up in 90 days for medication management.  Plan: Lumbar RFA with Dr. Marcelino    [name]  has a current medication list which includes the following long-term medication(s): albuterol , amitriptyline , fluticasone -salmeterol, and omeprazole .  Pharmacotherapy (Medications Ordered): Meds ordered this encounter  Medications   HYDROcodone -acetaminophen  (NORCO/VICODIN) 5-325 MG tablet    Sig: Take 1 tablet by mouth 2 (two) times daily as needed for severe pain (pain score 7-10). Must last 30 days.    Dispense:  60 tablet    Refill:  0    Must last 30 days   HYDROcodone -acetaminophen  (NORCO/VICODIN) 5-325 MG tablet    Sig: Take 1 tablet by mouth 2 (two) times daily as needed for severe pain (pain score 7-10). Must last 30 days.    Dispense:  60 tablet    Refill:  0    Must last 30 days   Buprenorphine  HCl (BELBUCA ) 300 MCG FILM    Sig: Place 1 Film inside cheek 2 (two) times daily as needed. Must last 30 days.    Dispense:  60 Film    Refill:  2    Chronic Pain: STOP Act  (Not applicable) Fill 1 day early if closed on refill date. Avoid benzodiazepines within 8 hours of opioids   HYDROcodone -acetaminophen  (NORCO/VICODIN) 5-325 MG tablet    Sig: Take 1 tablet by mouth 2 (two) times daily as needed for severe pain (pain score 7-10). Must last 30 days.    Dispense:  60 tablet    Refill:  0    Must last 30 days   Orders:  Orders Placed This Encounter  Procedures   Radiofrequency,Lumbar    Standing Status:   Future    Expiration Date:   03/21/2024    Scheduling Instructions:     Side(s): Bilateral     Level(s): L2, L3, L4, L5, & S1 Medial Branch Nerve(s)     Sedation: With Sedation     Scheduling Timeframe: As  soon as pre-approved    Where will this procedure be performed?:   [facility]        Return in about 3 months (around 03/21/2024) for (F2F), (MM), Emmy Blanch NP.    Recent Visits Date Type Provider Dept  10/05/23 Office Visit Marcelino Nurse, MD [facility]  Showing recent visits within past 90 days and meeting all other requirements Today's Visits Date Type Provider Dept  12/20/23 Office Visit Khup Sapia K, NP [facility]  Showing today's visits and meeting all other requirements Future Appointments Date Type Provider Dept  03/13/24 Appointment Talani Brazee K, NP [facility]  Showing future appointments within next 90 days and meeting all other requirements  I discussed the assessment and treatment plan with the patient. The patient was provided an opportunity to ask questions and all were answered. The patient agreed with the plan and demonstrated an understanding of the instructions.  Patient advised to call back or seek an in-person evaluation if the symptoms or condition worsens.  Duration of encounter: 30 minutes.  Total time on encounter, as per AMA guidelines included both the face-to-face and non-face-to-face time personally spent by the physician and/or other qualified health care  professional(s) on the day of the encounter (includes time in activities that require the physician or other qualified health care professional and does not include time in activities normally performed by clinical staff). Physician's time may include the following activities when performed: Preparing to see the patient (e.g., pre-charting review of records, searching for previously ordered imaging, lab work, and nerve conduction tests) Review of prior analgesic pharmacotherapies. Reviewing PMP Interpreting ordered tests (e.g., lab work, imaging, nerve conduction tests) Performing post-procedure evaluations, including interpretation of diagnostic procedures Obtaining and/or reviewing separately obtained history Performing a medically appropriate examination and/or evaluation Counseling and educating the patient/family/caregiver Ordering medications, tests, or procedures Referring and communicating with other health care professionals (when not separately reported) Documenting clinical information in the electronic or other health record Independently interpreting results (not separately reported) and communicating results to the patient/ family/caregiver Care coordination (not separately reported)  Note by: Kryslyn Helbig K Lorita Forinash, NP (TTS and AI technology used. I apologize for any typographical errors that were not detected and corrected.) Date: 12/20/2023; Time: 9:08 AM

## 2023-12-20 NOTE — Progress Notes (Signed)
 Nursing Pain Medication Assessment:  Safety precautions to be maintained throughout the outpatient stay will include: orient to surroundings, keep bed in low position, maintain call bell within reach at all times, provide assistance with transfer out of bed and ambulation.  Medication Inspection Compliance: Pill count conducted under aseptic conditions, in front of the patient. Neither the pills nor the bottle was removed from the patient's sight at any time. Once count was completed pills were immediately returned to the patient in their original bottle.  Medication: Hydrocodone /APAP Pill/Patch Count: 40 of 90 pills/patches remain Pill/Patch Appearance: Markings consistent with prescribed medication Bottle Appearance: Standard pharmacy container. Clearly labeled. Filled Date: 04 / 16 / 2025 Last Medication intake:  Today

## 2023-12-21 ENCOUNTER — Other Ambulatory Visit: Payer: Self-pay | Admitting: Nurse Practitioner

## 2023-12-21 ENCOUNTER — Telehealth: Payer: Self-pay | Admitting: Licensed Clinical Social Worker

## 2023-12-21 ENCOUNTER — Encounter: Payer: Self-pay | Admitting: Licensed Clinical Social Worker

## 2023-12-21 NOTE — Patient Instructions (Signed)
 Brandy [name] Brandy [name]  - I am sorry I was unable to reach you today for our scheduled appointment. I work with Ngetich, Roxan BROCKS, NP and am calling to support your healthcare needs. Please contact me at [phone] at your earliest convenience. I look forward to speaking with you soon.   Thank you,  Rolin Kerns, LCSW [place]  [facility], [facility] Clinical Social Worker Direct Dial: [phone]  Fax: [phone] Website: [email or web address] 3:54 PM

## 2023-12-26 ENCOUNTER — Other Ambulatory Visit: Payer: Self-pay

## 2023-12-26 DIAGNOSIS — C50919 Malignant neoplasm of unspecified site of unspecified female breast: Secondary | ICD-10-CM

## 2023-12-26 NOTE — Progress Notes (Signed)
 HEMATOLOGY/ONCOLOGY CLINIC NOTE  Date of Service: .12/27/2023    Patient Care Team: Ngetich, Roxan BROCKS, NP as PCP - General (Family Medicine) Tobb, Kardie, DO as PCP - Cardiology (Cardiology) Cristopher Suzen HERO, NP as Nurse Practitioner Ezzard Rolin BIRCH, LCSW as VBCI Care Management (Licensed Clinical Social Worker) Suzen Downy  CHIEF COMPLAINTS/PURPOSE OF CONSULTATION:  Follow-up for continued evaluation and management of metastatic ER/PR positive HER2 negative breast cancer  INTERVAL HISTORY:  RAEGHAN [name] [name]  is a 58 y.o. female is here for continued evaluation and management of breast cancer.  Patient was last seen by me on 10/23/2023 and reported significant social stressors including recently losing her husband. She noted some grade 1-2 diarrhea.   Patient presents with a walker during today's visit. She takes Verzenio  100 MG twice daily.   She reports some diarrhea, which she does not attribute to Verzenio . Patient notes that she does consume a lot of cottage cheese in her diet which may be causing diarrhea. She does believe that she is lactose-intolerant. Patient notes that she started consuming lactase milk which has been better.  She notes that last month, when she got her Faslodex  injection into the site of her right buttocks, she got a welt again. Patient reports soreness/pain in the area for 2 days and notes using an ice pack for 30 minutes  She notes that she ran out of pepsid, and is unsure if this made a difference. Patient also noted that she doubled her benadryl , and had ibuprofen .   Patient received a mammogram last month, which showed normal findings.   Patient notes blood glucose level of 205 mg/dL. Her blood glucose is 97 mg/dL today.   Patient does not recall any infection issues.   She is not taking vitamin B complex, or oral iron, but does take a multivitamin. Patient notes that she was told to limit her supplemental B12.   She denies  any new medication changes.   She reports having an ablation procedure which improved her back pain. Patient denies any other new bone pains.   Patient denies any bleeding issues, such as nose bleeds, gum bleeds, black stools, or blood in stools.  She notes that she will have a Left construction of intermarginal adhesions with median tarsorrhapy on 02/16/2024 as intervention for her dry eyes.   Patient denies any abdominal pain or leg swelling.   She is noted to be wearing a boot on her left foot due to ankle pain. Patient reports that she was seen by a podiatrist for her ankle pain. Her x-ray was normal. She is unsure of the cause of her foot pain, but does note findings of arthritis.   She notes that she previously tried to wear a soft boot, which worsened pain. Her current standard boot does improve symptoms.   She reports that needing emla  cream for her port. Patient notes needing to apply an ice pack to the area of her port.   Patient notes that she has settled down into her new apartment nicely.   MEDICAL HISTORY:  Past Medical History:  Diagnosis Date   Acute pansinusitis 08/02/2017   Anxiety    Arthritis    ASD (atrial septal defect)    s/p closure with Amplatzer device 10/05/04 (Dr. DOROTHA Franky Minerva, [facility]) 10/05/04   Cancer [facility])    Cataract    Chronic pain    CKD (chronic kidney disease)    Dyspnea    Fatty liver 09/04/2019   GERD (  gastroesophageal reflux disease)    Heart murmur    no longer heard   History of hiatal hernia    Hyperlipidemia    Legally blind in right eye, as defined in [place]     Lumbar herniated disc    Migraines    On home oxygen  therapy 06/15/2022   Pt was given O2 on D/C from [facility] 04/2022   OSA on CPAP 06/15/2022   PONV (postoperative nausea and vomiting)    Sciatica   Uterine Fibroids  SURGICAL HISTORY: Past Surgical History:  Procedure Laterality Date   ABLATION     BREAST LUMPECTOMY WITH RADIOACTIVE SEED AND SENTINEL LYMPH NODE BIOPSY  Right 11/23/2020   Procedure: RIGHT BREAST LUMPECTOMY WITH RADIOACTIVE SEED AND RIGHT AXILLARY SENTINEL LYMPH NODE BIOPSY;  Surgeon: Ebbie Cough, MD;  Location: [facility] OR;  Service: General;  Laterality: Right;   BREAST SURGERY Bilateral 2011   Breast Reduction Surgery   BUNIONECTOMY     CARDIAC CATHETERIZATION     10/05/04 [facility]): LM < 25%, otherwise normal coronaries. No pulmonary HTN, Mildly enlarged RV. Secundum ASD s/p closure.   CARDIAC SURGERY     CATARACT EXTRACTION     CLEFT PALATE REPAIR     s/p cleft lip and palate repair   EYE SURGERY Right 2019   right eye removed   IR IMAGING GUIDED PORT INSERTION  12/06/2023   LUMBAR LAMINECTOMY/DECOMPRESSION MICRODISCECTOMY Left 05/23/2016   Procedure: LEFT L4-L5 LATERAL RECESS DECOMPRESSION WITH CENTRAL AND RIGHT DECOMPRESSION VIA LEFT SIDE;  Surgeon: Lynwood FORBES Better, MD;  Location: [facility] OR;  Service: Orthopedics;  Laterality: Left;   LUMBAR LAMINECTOMY/DECOMPRESSION MICRODISCECTOMY Left 05/23/2016   Procedure: LUMBAR LAMINECTOMY/DECOMPRESSION MICRODISCECTOMY Lumbar five - Sacral One 1 LEVEL;  Surgeon: Lynwood FORBES Better, MD;  Location: [facility] OR;  Service: Orthopedics;  Laterality: Left;   REDUCTION MAMMAPLASTY  2011   SHOULDER INJECTION Left 05/23/2016   Procedure: SHOULDER INJECTION;  Surgeon: Lynwood FORBES Better, MD;  Location: [facility] OR;  Service: Orthopedics;  Laterality: Left;  band-aid per pa-c   TRANSTHORACIC ECHOCARDIOGRAM     12/15/05 [facility]): Mild LVH, EF > 55%, grade 1 diastolic dysfunction, Trivial MR/PR/TR.   TUBAL LIGATION    Endometrial ablation 2003 Breast Reduction, bilateral 2011  SOCIAL HISTORY: Social History   Socioeconomic History   Marital status: Married    Spouse name: Brandy [name]    Number of children: 3   Years of education: 14   Highest education level: Associate degree: occupational, Scientist, product/process development, or vocational program  Occupational History   Not on file  Tobacco Use   Smoking status: Former    Current packs/day: 0.00     Average packs/day: 0.3 packs/day for 25.0 years (6.3 ttl pk-yrs)    Types: Cigarettes    Start date: 02/04/1993    Quit date: 02/04/2018    Years since quitting: 5.9   Smokeless tobacco: Never  Vaping Use   Vaping status: Never Used  Substance and Sexual Activity   Alcohol  use: No   Drug use: No   Sexual activity: Not Currently  Other Topics Concern   Not on file  Social History Narrative   Tobacco use, amount per day now: None.   Past tobacco use, amount per day: 1/4   How many years did you use tobacco: Intermittent x 20 years   Alcohol  use (drinks per week): N/A   Diet: Plant Base   Do you drink/eat things with caffeine: Coffee, Tea, Soda.   Marital status:    Married  What year were you married? 1999   Do you live in a house, apartment, assisted living, condo, trailer, etc.? House    Is it one or more stories? 2   How many persons live in your home? 6 adults, 5 children.   Do you have pets in your home?( please list) 1 Terrier   Highest Level of education completed? AAS   Current or past profession: LPN   Do you exercise?   A little                               Type and how often? Walk, stretches.    Do you have a living will? No   Do you have a DNR form?   No                                If not, do you want to discuss one?   Do you have signed POA/HPOA forms? No                       If so, please bring to you appointment      Do you have any difficulty bathing or dressing yourself? No   Do you have any difficulty preparing food or eating? No   Do you have any difficulty managing your medications? No   Do you have any difficulty managing your finances? No   Do you have any difficulty affording your medications? No.   Social Drivers of Health   Financial Resource Strain: Low Risk  (10/18/2023)   Overall Financial Resource Strain (CARDIA)    Difficulty of Paying Living Expenses: Not hard at all  Food Insecurity: No Food Insecurity  (10/18/2023)   Hunger Vital Sign    Worried About Running Out of Food in the Last Year: Never true    Ran Out of Food in the Last Year: Never true  Recent Concern: Food Insecurity - Food Insecurity Present (09/13/2023)   Hunger Vital Sign    Worried About Running Out of Food in the Last Year: Often true    Ran Out of Food in the Last Year: Sometimes true  Transportation Needs: No Transportation Needs (10/18/2023)   PRAPARE - Administrator, Civil Service (Medical): No    Lack of Transportation (Non-Medical): No  Physical Activity: Insufficiently Active (10/18/2023)   Exercise Vital Sign    Days of Exercise per Week: 7 days    Minutes of Exercise per Session: 20 min  Stress: No Stress Concern Present (10/18/2023)   [employer] of Occupational Health - Occupational Stress Questionnaire    Feeling of Stress : Not at all  Social Connections: Moderately Integrated (09/13/2023)   Social Connection and Isolation Panel    Frequency of Communication with Friends and Family: More than three times a week    Frequency of Social Gatherings with Friends and Family: Once a week    Attends Religious Services: More than 4 times per year    Active Member of [employer] or Organizations: Yes    Attends Banker Meetings: More than 4 times per year    Marital Status: Widowed  Intimate Partner Violence: Not At Risk (10/18/2023)   Humiliation, Afraid, Rape, and Kick questionnaire    Fear of Current or Ex-Partner: No    Emotionally Abused: No    Physically  Abused: No    Sexually Abused: No    FAMILY HISTORY: Family History  Problem Relation Age of Onset   Arthritis Mother    Hypertension Mother    Diabetes Mother    High blood pressure Mother    Cancer Father        Lung   Colon cancer Neg Hx    Colon polyps Neg Hx    Esophageal cancer Neg Hx    Rectal cancer Neg Hx    Stomach cancer Neg Hx     ALLERGIES:  is allergic to zithromax [azithromycin], tramadol , and  psyllium.  MEDICATIONS:  Current Outpatient Medications  Medication Sig Dispense Refill   acetaminophen  (TYLENOL ) 500 MG tablet Take 500 mg by mouth daily as needed for moderate pain.     albuterol  (VENTOLIN  HFA) 108 (90 Base) MCG/ACT inhaler Inhale 2 puffs into the lungs every 6 (six) hours as needed for wheezing or shortness of breath. 8 g 6   ALPRAZolam  (XANAX ) 1 MG tablet TAKE 1 TABLET BY MOUTH AT  BEDTIME AS NEEDED FOR ANXIETY 30 tablet 3   amitriptyline  (ELAVIL ) 75 MG tablet Take 1 tablet (75 mg total) by mouth at bedtime. 90 tablet 3   Buprenorphine  HCl (BELBUCA ) 300 MCG FILM Place 1 Film inside cheek 2 (two) times daily as needed. Must last 30 days. 60 Film 2   Carboxymethylcellul-Glycerin (LUBRICATING EYE DROPS OP) Place 1 drop into the right eye in the morning, at noon, and at bedtime.     cyanocobalamin  (VITAMIN B12) 500 MCG tablet Take 1 tablet (500 mcg total) by mouth daily. 30 tablet 0   Evolocumab  (REPATHA  SURECLICK) 140 MG/ML SOAJ Inject 140 mg into the skin every 14 (fourteen) days. 6 mL 3   fluticasone -salmeterol (ADVAIR) 250-50 MCG/ACT AEPB Inhale 1 puff into the lungs in the morning and at bedtime. 180 each 3   HYDROcodone -acetaminophen  (NORCO/VICODIN) 5-325 MG tablet Take 1 tablet by mouth 2 (two) times daily as needed for severe pain (pain score 7-10). Must last 30 days. 60 tablet 0   [START ON 01/19/2024] HYDROcodone -acetaminophen  (NORCO/VICODIN) 5-325 MG tablet Take 1 tablet by mouth 2 (two) times daily as needed for severe pain (pain score 7-10). Must last 30 days. 60 tablet 0   [START ON 02/18/2024] HYDROcodone -acetaminophen  (NORCO/VICODIN) 5-325 MG tablet Take 1 tablet by mouth 2 (two) times daily as needed for severe pain (pain score 7-10). Must last 30 days. 60 tablet 0   ketoconazole (NIZORAL) 2 % cream Apply 1 Application topically 2 (two) times daily.     loperamide  (IMODIUM ) 2 MG capsule Take 1-2 capsules (2-4 mg total) by mouth 4 (four) times daily as needed for  diarrhea or loose stools. 30 capsule 1   magnesium  30 MG tablet Take 30 mg by mouth 2 (two) times daily.     methocarbamol  (ROBAXIN ) 500 MG tablet Take 1 tablet (500 mg total) by mouth every 6 (six) hours as needed for muscle spasms. 40 tablet 2   midodrine  (PROAMATINE ) 10 MG tablet TAKE 1 TABLET BY MOUTH IN THE  MORNING AND AT BEDTIME 180 tablet 1   Multiple Vitamin (MULTIVITAMIN WITH MINERALS) TABS tablet Take 1 tablet by mouth daily.     omeprazole  (PRILOSEC  OTC) 20 MG tablet Take 20 mg by mouth daily.     PRESCRIPTION MEDICATION CPAP- At bedtime     triamcinolone  cream (KENALOG ) 0.1 % Apply 1 Application topically 2 (two) times daily. 30 g 0   VERZENIO  100 MG tablet TAKE  1 TABLET BY MOUTH TWICE DAILY 56 tablet 0   Vitamin D , Ergocalciferol , (DRISDOL ) 1.25 MG (50000 UNIT) CAPS capsule TAKE 1 CAPSULE BY MOUTH EVERY 7  DAYS ( MONDAY ) 5 capsule 9   No current facility-administered medications for this visit.    REVIEW OF SYSTEMS:    10 Point review of Systems was done is negative except as noted above.   PHYSICAL EXAMINATION: ECOG FS:1 - Symptomatic but completely ambulatory  Vitals:   12/27/23 1326 12/27/23 1334  BP: (!) 146/74 (!) 145/77  Pulse: 91   Resp: 20   Temp: (!) 97.2 F (36.2 C)   SpO2: 100%     Wt Readings from Last 3 Encounters:  12/27/23 205 lb 1.6 oz (93 kg)  12/20/23 200 lb (90.7 kg)  11/27/23 204 lb 9.6 oz (92.8 kg)   Body mass index is 32.12 kg/m.     GENERAL:alert, in no acute distress and comfortable SKIN: no acute rashes, no significant lesions EYES: conjunctiva are pink and non-injected, sclera anicteric OROPHARYNX: MMM, no exudates, no oropharyngeal erythema or ulceration NECK: supple, no JVD LYMPH:  no palpable lymphadenopathy in the cervical, axillary or inguinal regions LUNGS: clear to auscultation b/l with normal respiratory effort HEART: regular rate & rhythm ABDOMEN:  normoactive bowel sounds , non tender, not distended. Extremity: no  pedal edema PSYCH: alert & oriented x 3 with fluent speech NEURO: no focal motor/sensory deficits   LABORATORY DATA:  I have reviewed the data as listed     Latest Ref Rng & Units 12/27/2023   12:07 PM 11/20/2023    1:30 PM 10/23/2023    1:44 PM  CBC  WBC 4.0 - 10.5 K/uL 4.6  4.3  4.5   Hemoglobin 12.0 - 15.0 g/dL 89.0  87.8  88.6   Hematocrit 36.0 - 46.0 % 31.9  35.8  33.9   Platelets 150 - 400 K/uL 185  194  204        Latest Ref Rng & Units 12/27/2023   12:07 PM 11/20/2023    1:30 PM 10/23/2023    1:44 PM  CMP  Glucose 70 - 99 mg/dL 97  87  76   BUN 6 - 20 mg/dL 15  11  23    Creatinine 0.44 - 1.00 mg/dL 8.70  8.79  8.48   Sodium 135 - 145 mmol/L 141  140  141   Potassium 3.5 - 5.1 mmol/L 3.8  4.1  4.1   Chloride 98 - 111 mmol/L 105  104  105   CO2 22 - 32 mmol/L 31  30  29    Calcium  8.9 - 10.3 mg/dL 9.4  9.4  9.6   Total Protein 6.5 - 8.1 g/dL 7.0  7.1  7.6   Total Bilirubin 0.0 - 1.2 mg/dL 0.6  0.8  0.9   Alkaline Phos 38 - 126 U/L 212  239  227   AST 15 - 41 U/L 78  69  69   ALT 0 - 44 U/L 57  92  54          RADIOGRAPHIC STUDIES: I have personally reviewed the radiological images as listed and agreed with the findings in the report. IR IMAGING GUIDED PORT INSERTION Result Date: 12/06/2023 INDICATION: 58 year old with history of breast cancer. Port-A-Cath needed due to poor venous access. EXAM: FLUOROSCOPIC AND ULTRASOUND GUIDED PLACEMENT OF A SUBCUTANEOUS PORT MEDICATIONS: Moderate sedation ANESTHESIA/SEDATION: Moderate (conscious) sedation was employed during this procedure. A total of Versed  4 mg and  fentanyl  200 mcg was administered intravenously at the order of the provider performing the procedure. Total intra-service moderate sedation time: 28 minutes. Patient's level of consciousness and vital signs were monitored continuously by radiology nurse throughout the procedure under the supervision of the provider performing the procedure. FLUOROSCOPY TIME:  Radiation  Exposure Index (as provided by the fluoroscopic device): 5 mGy Kerma COMPLICATIONS: None immediate. PROCEDURE: The procedure, risks, benefits, and alternatives were explained to the patient. Questions regarding the procedure were encouraged and answered. The patient understands and consents to the procedure. Patient was placed supine on the interventional table. Ultrasound confirmed a patent right internal jugular vein. Ultrasound image was saved for documentation. The right chest and neck were cleaned with a skin antiseptic and a sterile drape was placed. Maximal barrier sterile technique was utilized including caps, mask, sterile gowns, sterile gloves, sterile drape, hand hygiene and skin antiseptic. The right neck was anesthetized with 1% lidocaine . Small incision was made in the right neck with a blade. Micropuncture set was placed in the right internal jugular vein with ultrasound guidance. The micropuncture wire was used for measurement purposes. The right chest was anesthetized with 1% lidocaine  with epinephrine . #15 blade was used to make an incision and a subcutaneous port pocket was formed. 8 french Power Port was assembled. Subcutaneous tunnel was formed with a stiff tunneling device. The port catheter was brought through the subcutaneous tunnel. The port was placed in the subcutaneous pocket. The micropuncture set was exchanged for a peel-away sheath. The catheter was placed through the peel-away sheath and the tip was positioned at the superior cavoatrial junction. Catheter placement was confirmed with fluoroscopy. The port was accessed and flushed with heparinized saline. The port pocket was closed using two layers of absorbable sutures and Dermabond. The vein skin site was closed using a single layer of absorbable suture and Dermabond. Sterile dressings were applied. Patient tolerated the procedure well without an immediate complication. Ultrasound and fluoroscopic images were taken and saved for this  procedure. IMPRESSION: Placement of a subcutaneous power-injectable port device. Catheter tip at the superior cavoatrial junction. Electronically Signed   By: Juliene Balder M.D.   On: 12/06/2023 14:40    MM DIAG BREAST TOMO BILATERAL  Result Date: 01/04/2022 CLINICAL DATA:  Patient has a history of metastatic right breast cancer diagnosed in October of 2020. Patient is status post right breast lumpectomy in June of 2022. EXAM: DIGITAL DIAGNOSTIC BILATERAL MAMMOGRAM WITH TOMOSYNTHESIS TECHNIQUE: Bilateral digital diagnostic mammography and breast tomosynthesis was performed. COMPARISON:  Previous exam(s). ACR Breast Density Category b: There are scattered areas of fibroglandular density. FINDINGS: Cc and MLO views of bilateral breasts are submitted. Postsurgical changes are identified in the right breast. The left breast is stable. IMPRESSION: Benign findings. RECOMMENDATION: Bilateral diagnostic mammogram in 1 year. I have discussed the findings and recommendations with the patient. If applicable, a reminder letter will be sent to the patient regarding the next appointment. BI-RADS CATEGORY  2: Benign. Electronically Signed   By: Craig Farr M.D.   On: 01/04/2022 16:27    09/02/2020 Surgical Pathology     ASSESSMENT & PLAN:   EMMER LILLIBRIDGE [name]  is a 58 y.o. female with:  1. Metastatic breast cancer ER+/PRneg/Her2 neg  02/28/2019 neck CT with results revealing negative for mass or adenopathy in the neck.  03/04/2019 head MRI with results revealing Negative for metastatic disease.  No acute abnormality in the brain.  01/04/2022 Mammogram benign  2. Pulmonary metastases  02/18/2019 chest  and abdomen with results revealing 5cm spiculated soft tissue mass in inferior right breast, highly suspicious for primary breast carcinoma. No acute findings or metastatic disease within the abdomen or pelvis. Multiple small pulmonary nodules in both lung bases, consistent with pulmonary  metastases. 4.5 cm uterine fibroid.  02/28/2019 C/A/P CT with results revealing Irregular solid 5.0 cm right breast mass, suspicious for primary right breast malignancy. Innumerable solid pulmonary nodules scattered throughout both lungs, compatible with pulmonary metastases. No evidence of metastatic disease in the abdomen, pelvis or skeleton. Mildly enlarged and probably myomatous uterus. Simple 1.4 cm left adnexal cyst requires no follow-up. This recommendation follows ACR consensus guidelines: White Paper of the ACR Incidental Findings Committee II on Adnexal Findings. J Am Coll Radiol [phone]. Aortic Atherosclerosis (ICD10-I70.0).  NUCLEAR MEDICINE WHOLE BODY BONE SCAN completed on 03/19/2019 with results revealing 1. No scintigraphic evidence skeletal metastasis. 2. 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   04/09/2019 Bone Density ([id number]) which revealed The BMD measured at Femur Neck Right is 1.153 g/cm2 with a T-score of 0.8. This patient is considered normal according to World Health Organization [facility]) criteria. Lumbar spine was not utilized due to advanced degenerative changes. The scan quality is good. Femur Neck Right 04/09/2019 54.7 Normal 0.8 1.153 g/cm2. Left Forearm Radius 33% 04/09/2019 54.7 Normal 0.8 0.949 g/cm2.  08/04/2019 CT Angio Chest ([id number]) revealed 1. No lobar or central pulmonary embolus detected. Exam is limited secondary to respiratory motion. 2. Mild ground-glass attenuation may represent mild pneumonitis or areas of air trapping. 3. Signs of atrial septal closure. 4. Decrease in size and number of bilateral pulmonary nodules, marked response noted on today's exam with the only nodule remaining near a cm in the right upper lobe and the smaller nodules that were present on the previous examination throughout the chest no longer measurable though the lower lobes are limited by respiratory motion. 5. Decreased  size of right breast mass. 6. Probable hepatic steatosis.  S/p rt breast lumpectomy on 6/20 -- no residual carcinoma on pathology.  3. Elevated LFTs 2/2 extensive hepatic steatosis -Under the care of Dr. Shila and last seen on 03/26/2020 -07/03/2019 US  Abd revealed No acute findings. Normal gallbladder. No bile duct dilation. 2. Significant increased liver parenchymal echogenicity consistent with extensive hepatic steatosis.  PLAN:  -Discussed lab results on 12/27/23 in detail with patient. CBC showed WBC of 4.6K, hemoglobin of 10.9, and platelets of 185K. -CMP shows chronic elevation of liver functions mainly from fatty liver, and unlikely to be related to medications -her CA 15-3 tumor markers mildlly increased from high 20s to 48, which is not a significant increase -CA 27.29 61.7 U/mL -she complains of some mild diarrhea which she does not attribute to Verzenio  -Continue Verzenio  at 100 mg p.o. twice daily  -Continue monthly Faslodex .  -discussed that if her iron levels are still low, there may be a need for additional labs -recommend a whole body scan within the next month  -will order whole body scan -will order Emla  -reasonable to continue mammograms once a year -from a fatty liver standpoint, I would recommend lifestyle modifications, weight loss, and consuming a healthy diet. Also discussed that if there is concern for DM or hyperthyroidism, treating these conditions would also be beneficial from a fatty liver standpoint.  -answered all of patient's questions in detail  FOLLOW-UP: CT CAP in 2 weeks RTC with Dr Onesimo with portflush labs in 4 weeks Plz schedule monthly faslodex   x 12 with portflush and portflush labs   The total time spent in the appointment was 30 minutes* .  All of the patient's questions were answered with apparent satisfaction. The patient knows to call the clinic with any problems, questions or concerns.   Brandy Saran MD MS AAHIVMS [facility] Hematology/Oncology Physician [facility]  .*Total Encounter Time as defined by the Centers for Medicare and Medicaid Services includes, in addition to the face-to-face time of a patient visit (documented in the note above) non-face-to-face time: obtaining and reviewing outside history, ordering and reviewing medications, tests or procedures, care coordination (communications with other health care professionals or caregivers) and documentation in the medical record.    I,Mitra Faeizi,acting as a Neurosurgeon for Brandy Saran, MD.,have documented all relevant documentation on the behalf of Brandy Saran, MD,as directed by  Brandy Saran, MD while in the presence of Brandy Saran, MD.  .I have reviewed the above documentation for accuracy and completeness, and I agree with the above. .Jhaniya Briski Kishore Marquett Bertoli MD

## 2023-12-27 ENCOUNTER — Inpatient Hospital Stay: Attending: Hematology

## 2023-12-27 ENCOUNTER — Inpatient Hospital Stay: Admitting: Hematology

## 2023-12-27 ENCOUNTER — Inpatient Hospital Stay

## 2023-12-27 VITALS — BP 145/77 | HR 91 | Temp 97.2°F | Resp 20 | Wt 205.1 lb

## 2023-12-27 DIAGNOSIS — Z1721 Progesterone receptor positive status: Secondary | ICD-10-CM | POA: Insufficient documentation

## 2023-12-27 DIAGNOSIS — Z170 Estrogen receptor positive status [ER+]: Secondary | ICD-10-CM | POA: Diagnosis not present

## 2023-12-27 DIAGNOSIS — C7800 Secondary malignant neoplasm of unspecified lung: Secondary | ICD-10-CM | POA: Diagnosis not present

## 2023-12-27 DIAGNOSIS — Z5111 Encounter for antineoplastic chemotherapy: Secondary | ICD-10-CM | POA: Diagnosis not present

## 2023-12-27 DIAGNOSIS — C50919 Malignant neoplasm of unspecified site of unspecified female breast: Secondary | ICD-10-CM

## 2023-12-27 DIAGNOSIS — C50511 Malignant neoplasm of lower-outer quadrant of right female breast: Secondary | ICD-10-CM | POA: Diagnosis not present

## 2023-12-27 DIAGNOSIS — Z1732 Human epidermal growth factor receptor 2 negative status: Secondary | ICD-10-CM | POA: Insufficient documentation

## 2023-12-27 LAB — CMP (CANCER CENTER ONLY)
ALT: 57 U/L — ABNORMAL HIGH (ref 0–44)
AST: 78 U/L — ABNORMAL HIGH (ref 15–41)
Albumin: 3.6 g/dL (ref 3.5–5.0)
Alkaline Phosphatase: 212 U/L — ABNORMAL HIGH (ref 38–126)
Anion gap: 5 (ref 5–15)
BUN: 15 mg/dL (ref 6–20)
CO2: 31 mmol/L (ref 22–32)
Calcium: 9.4 mg/dL (ref 8.9–10.3)
Chloride: 105 mmol/L (ref 98–111)
Creatinine: 1.29 mg/dL — ABNORMAL HIGH (ref 0.44–1.00)
GFR, Estimated: 48 mL/min — ABNORMAL LOW (ref >60)
Glucose, Bld: 97 mg/dL (ref 70–99)
Potassium: 3.8 mmol/L (ref 3.5–5.1)
Sodium: 141 mmol/L (ref 135–145)
Total Bilirubin: 0.6 mg/dL (ref 0.0–1.2)
Total Protein: 7 g/dL (ref 6.5–8.1)

## 2023-12-27 LAB — CBC WITH DIFFERENTIAL (CANCER CENTER ONLY)
Abs Immature Granulocytes: 0.01 K/uL (ref 0.00–0.07)
Basophils Absolute: 0 K/uL (ref 0.0–0.1)
Basophils Relative: 0 %
Eosinophils Absolute: 0 K/uL (ref 0.0–0.5)
Eosinophils Relative: 1 %
HCT: 31.9 % — ABNORMAL LOW (ref 36.0–46.0)
Hemoglobin: 10.9 g/dL — ABNORMAL LOW (ref 12.0–15.0)
Immature Granulocytes: 0 %
Lymphocytes Relative: 41 %
Lymphs Abs: 1.9 K/uL (ref 0.7–4.0)
MCH: 35.7 pg — ABNORMAL HIGH (ref 26.0–34.0)
MCHC: 34.2 g/dL (ref 30.0–36.0)
MCV: 104.6 fL — ABNORMAL HIGH (ref 80.0–100.0)
Monocytes Absolute: 0.3 K/uL (ref 0.1–1.0)
Monocytes Relative: 6 %
Neutro Abs: 2.4 K/uL (ref 1.7–7.7)
Neutrophils Relative %: 52 %
Platelet Count: 185 K/uL (ref 150–400)
RBC: 3.05 MIL/uL — ABNORMAL LOW (ref 3.87–5.11)
RDW: 15.3 % (ref 11.5–15.5)
Smear Review: NORMAL
WBC Count: 4.6 K/uL (ref 4.0–10.5)
nRBC: 0 % (ref 0.0–0.2)

## 2023-12-27 MED ORDER — FULVESTRANT 250 MG/5ML IM SOSY
500.0000 mg | PREFILLED_SYRINGE | Freq: Once | INTRAMUSCULAR | Status: AC
Start: 2023-12-27 — End: 2023-12-27
  Administered 2023-12-27: 500 mg via INTRAMUSCULAR
  Filled 2023-12-27: qty 10

## 2023-12-27 MED ORDER — HEPARIN SOD (PORK) LOCK FLUSH 100 UNIT/ML IV SOLN
500.0000 [IU] | Freq: Once | INTRAVENOUS | Status: AC
  Administered 2023-12-27: 500 [IU] via INTRAVENOUS

## 2023-12-27 MED ORDER — LIDOCAINE-PRILOCAINE 2.5-2.5 % EX KIT: PACK | Freq: Once | CUTANEOUS | 2 refills | Status: AC

## 2023-12-27 MED ORDER — SODIUM CHLORIDE 0.9% FLUSH
10.0000 mL | INTRAVENOUS | Status: DC | PRN
  Administered 2023-12-27: 10 mL via INTRAVENOUS

## 2023-12-29 ENCOUNTER — Other Ambulatory Visit: Payer: Self-pay | Admitting: Family

## 2023-12-29 DIAGNOSIS — F5101 Primary insomnia: Secondary | ICD-10-CM

## 2024-01-01 ENCOUNTER — Telehealth: Payer: Self-pay

## 2024-01-01 NOTE — Progress Notes (Unsigned)
 Complex Care Management Care Guide Note  01/01/2024 Name: Brandy [name]  MRN: [id number] DOB: September 11, 1965  Brandy [name]  is a 58 y.o. year old female who is a primary care patient of Ngetich, Dinah C, NP and is actively engaged with the care management team. I reached out to Brandy [name]  by phone today to assist with re-scheduling  with the Licensed Clinical Child psychotherapist.  Follow up plan: Unsuccessful telephone outreach attempt made. A HIPAA compliant phone message was left for the patient providing contact information and requesting a return call.  Leotis Rase [facility], [facility] Guide  Direct Dial: [phone]  Fax [phone]

## 2024-01-01 NOTE — Telephone Encounter (Signed)
 Patient has request refill on medication Xanax . Patient medication last refilled 08/11/2023 with 30 tablets and 5 refills. Medication pend and sent to PCP Ngetich, Dinah C, NP for approval.

## 2024-01-02 ENCOUNTER — Ambulatory Visit: Admitting: Podiatry

## 2024-01-02 ENCOUNTER — Encounter: Payer: Self-pay | Admitting: Podiatry

## 2024-01-02 DIAGNOSIS — B351 Tinea unguium: Secondary | ICD-10-CM

## 2024-01-02 DIAGNOSIS — M79674 Pain in right toe(s): Secondary | ICD-10-CM

## 2024-01-02 DIAGNOSIS — M79675 Pain in left toe(s): Secondary | ICD-10-CM | POA: Diagnosis not present

## 2024-01-03 ENCOUNTER — Encounter: Payer: Self-pay | Admitting: Hematology

## 2024-01-03 NOTE — Progress Notes (Signed)
 Complex Care Management Care Guide Note  01/03/2024 Name: [name]  MRN: [id number] DOB: August 22, 1965  Brandy [name]  is a 58 y.o. year old female who is a primary care patient of Ngetich, Dinah C, NP and is actively engaged with the care management team. I reached out to Brandy [name]  by phone today to assist with re-scheduling  with the Licensed Clinical Child psychotherapist.  Follow up plan: Unsuccessful telephone outreach attempt made. A HIPAA compliant phone message was left for the patient providing contact information and requesting a return call.  Leotis Rase [facility], [facility] Guide  Direct Dial: [phone]  Fax [phone]

## 2024-01-04 NOTE — Progress Notes (Signed)
  Subjective:  Patient ID: Brandy [name] , female    DOB: 10-19-1965,  MRN: [id number]  Brandy [name]  presents to clinic today for painful elongated mycotic toenails 1-5 bilaterally which are tender when wearing enclosed shoe gear. Pain is relieved with periodic professional debridement. He states he has been using Bag Balm and Revitaderm  and still has some dry skin. Chief Complaint  Patient presents with   RFC    Rm15/not diabetic/Dr. Ngetich last vist May 2025   New problem(s): None.   PCP is Ngetich, Brandy BROCKS, Brandy [name].  Allergies  Allergen Reactions   Zithromax [Azithromycin] Shortness Of Breath, Itching and Other (See Comments)    TOTAL BODY ITCHING [EVEN SOLES OF FEET] WHEEZING    Tramadol  Itching and Other (See Comments)    Has taken recently without any side effects.   Psyllium Nausea And Vomiting and Other (See Comments)    Metamucil. Sneezing      Review of Systems: Negative except as noted in the HPI.  Objective: No changes noted in today's physical examination. There were no vitals filed for this visit. Brandy [name]  is a pleasant 58 y.o. female in NAD. AAO x 3.  Vascular Examination: Capillary refill time immediate b/l. Palpable pedal pulses. Pedal hair present b/l. Pedal edema absent b/l. No pain with calf compression b/l. Skin temperature gradient WNL b/l. No cyanosis or clubbing b/l. No ischemia or gangrene noted b/l.   Neurological Examination: Sensation grossly intact b/l with 10 gram monofilament. Vibratory sensation intact b/l.   Dermatological Examination: Pedal skin with normal turgor, texture and tone b/l.  No open wounds. No interdigital macerations.   Toenails 1-5 b/l thick, discolored, elongated with subungual debris and pain on dorsal palpation.   No hyperkeratotic nor porokeratotic lesions.  Musculoskeletal Examination: Muscle strength 5/5 to all lower extremity muscle groups bilaterally. No pain, crepitus  or joint limitation noted with ROM bilateral LE. No gross bony deformities bilaterally.  Radiographs: None   Assessment/Plan: 1. Pain due to onychomycosis of toenails of both feet     Consent given for treatment. Patient examined. All patient's and/or POA's questions/concerns addressed on today's visit. Mycotic toenails 1-5 debrided in length and girth without incident. Continue Bag Balm and Revitaderm Cream. Add Aquaphor Lotion to feet once daily. Continue soft, supportive shoe gear daily. Report any pedal injuries to medical professional. Call office if there are any quesitons/concerns. -Patient/POA to call should there be question/concern in the interim.   Return in about 3 months (around 04/03/2024).  Delon LITTIE Merlin, DPM      [place] LOCATION: [address], [place] [address]                   Office [phone]   [facility] LOCATION: [address] [place], [place] [address] Office [phone]

## 2024-01-05 ENCOUNTER — Encounter (HOSPITAL_BASED_OUTPATIENT_CLINIC_OR_DEPARTMENT_OTHER): Payer: Self-pay

## 2024-01-05 ENCOUNTER — Other Ambulatory Visit: Payer: Self-pay | Admitting: Hematology

## 2024-01-05 DIAGNOSIS — C50919 Malignant neoplasm of unspecified site of unspecified female breast: Secondary | ICD-10-CM

## 2024-01-10 ENCOUNTER — Ambulatory Visit (HOSPITAL_COMMUNITY)
Admission: RE | Admit: 2024-01-10 | Discharge: 2024-01-10 | Disposition: A | Discharge status: Home / Self Care | Source: Ambulatory Visit | Attending: Hematology | Admitting: Hematology

## 2024-01-10 DIAGNOSIS — I700 Atherosclerosis of aorta: Secondary | ICD-10-CM | POA: Insufficient documentation

## 2024-01-10 DIAGNOSIS — K8020 Calculus of gallbladder without cholecystitis without obstruction: Secondary | ICD-10-CM | POA: Diagnosis not present

## 2024-01-10 DIAGNOSIS — C50919 Malignant neoplasm of unspecified site of unspecified female breast: Secondary | ICD-10-CM

## 2024-01-10 DIAGNOSIS — C50911 Malignant neoplasm of unspecified site of right female breast: Secondary | ICD-10-CM | POA: Diagnosis not present

## 2024-01-10 DIAGNOSIS — R918 Other nonspecific abnormal finding of lung field: Secondary | ICD-10-CM | POA: Diagnosis not present

## 2024-01-10 MED ORDER — HEPARIN SOD (PORK) LOCK FLUSH 100 UNIT/ML IV SOLN
500.0000 [IU] | Freq: Once | INTRAVENOUS | Status: AC
  Administered 2024-01-10: 500 [IU] via INTRAVENOUS

## 2024-01-10 MED ORDER — HEPARIN SOD (PORK) LOCK FLUSH 100 UNIT/ML IV SOLN
INTRAVENOUS | Status: AC
  Filled 2024-01-10: qty 5

## 2024-01-10 MED ORDER — IOHEXOL 300 MG/ML  SOLN
100.0000 mL | Freq: Once | INTRAMUSCULAR | Status: AC | PRN
  Administered 2024-01-10: 100 mL via INTRAVENOUS

## 2024-01-15 ENCOUNTER — Ambulatory Visit
Admission: RE | Admit: 2024-01-15 | Discharge: 2024-01-15 | Disposition: A | Discharge status: Home / Self Care | Source: Ambulatory Visit | Attending: Student in an Organized Health Care Education/Training Program | Admitting: Student in an Organized Health Care Education/Training Program

## 2024-01-15 ENCOUNTER — Other Ambulatory Visit: Payer: Self-pay | Admitting: Family

## 2024-01-15 ENCOUNTER — Encounter: Payer: Self-pay | Admitting: Student in an Organized Health Care Education/Training Program

## 2024-01-15 ENCOUNTER — Inpatient Hospital Stay: Payer: Self-pay

## 2024-01-15 ENCOUNTER — Ambulatory Visit (HOSPITAL_BASED_OUTPATIENT_CLINIC_OR_DEPARTMENT_OTHER): Admitting: Student in an Organized Health Care Education/Training Program

## 2024-01-15 ENCOUNTER — Inpatient Hospital Stay

## 2024-01-15 DIAGNOSIS — M47816 Spondylosis without myelopathy or radiculopathy, lumbar region: Secondary | ICD-10-CM | POA: Diagnosis not present

## 2024-01-15 MED ORDER — ROPIVACAINE HCL 2 MG/ML IJ SOLN
18.0000 mL | Freq: Once | INTRAMUSCULAR | Status: AC
  Administered 2024-01-15 (×2): 18 mL via PERINEURAL
  Filled 2024-01-15: qty 20

## 2024-01-15 MED ORDER — DEXAMETHASONE SODIUM PHOSPHATE 10 MG/ML IJ SOLN
20.0000 mg | Freq: Once | INTRAMUSCULAR | Status: AC
  Administered 2024-01-15 (×2): 20 mg
  Filled 2024-01-15: qty 2

## 2024-01-15 MED ORDER — LACTATED RINGERS IV SOLN: Freq: Once | INTRAVENOUS | Status: AC

## 2024-01-15 MED ORDER — MIDAZOLAM HCL 2 MG/2ML IJ SOLN
0.5000 mg | Freq: Once | INTRAMUSCULAR | Status: AC
  Administered 2024-01-15 (×2): 2 mg via INTRAVENOUS
  Filled 2024-01-15: qty 2

## 2024-01-15 MED ORDER — LIDOCAINE HCL 2 % IJ SOLN
20.0000 mL | Freq: Once | INTRAMUSCULAR | Status: AC
  Administered 2024-01-15 (×2): 400 mg
  Filled 2024-01-15: qty 40

## 2024-01-15 NOTE — Progress Notes (Signed)
 PROVIDER NOTE: Interpretation of information contained herein should be left to medically-trained personnel. Specific patient instructions are provided elsewhere under Patient Instructions section of medical record. This document was created in part using STT-dictation technology, any transcriptional errors that may result from this process are unintentional.  Patient: Brandy [name]  Type: Established DOB: 1965/10/25 MRN: [id number] PCP: Leonarda Roxan BROCKS, NP  Service: Procedure DOS: 01/15/2024 Setting: Ambulatory Location: Ambulatory outpatient facility Delivery: Face-to-face Provider: Wallie Sherry, MD Specialty: Interventional Pain Management Specialty designation: 09 Location: Outpatient facility Ref. Prov.: Tobie Emmy POUR, NP    Procedure:           Type: Lumbar Facet, Medial Branch Radiofrequency Ablation (RFA) #3  Laterality: Bilateral  Level: L3, L4, and L5 Medial Branch Level(s).  Imaging: Fluoroscopy-guided         Anesthesia: Local anesthesia (1-2% Lidocaine ) Anxiolysis: IV Versed          DOS: 01/15/2024  Performed by: Wallie Sherry, MD  Purpose: Therapeutic/Palliative Indications: Low back pain severe enough to impact quality of life or function. Indications: 1. Lumbar facet syndrome (Multilevel) (Bilateral) (R>L)   2. Lumbar facet hypertrophy (Multilevel) (Bilateral)    Brandy [name]  has been dealing with the above chronic pain for longer than three months and has either failed to respond, was unable to tolerate, or simply did not get enough benefit from other more conservative therapies including, but not limited to: 1. Over-the-counter medications 2. Anti-inflammatory medications 3. Muscle relaxants 4. Membrane stabilizers 5. Opioids 6. Physical therapy and/or chiropractic manipulation 7. Modalities (Heat, ice, etc.) 8. Invasive techniques such as nerve blocks. Brandy [name]  has attained more than 50% relief of the pain from a  series of diagnostic injections conducted in separate occasions.  Pain Score: Pre-procedure: 8 /10 Post-procedure: 8 /10     Position / Prep / Materials:  Position: Prone  Prep solution: DuraPrep (Iodine Povacrylex [0.7% available iodine] and Isopropyl Alcohol , 74% w/w) Prep Area: Entire Lumbosacral Region (Lower back from mid-thoracic region to end of tailbone and from flank to flank.) Materials:  Tray: RFA (Radiofrequency) tray Needle(s):  Type: RFA (Teflon-coated radiofrequency ablation needles)  Pre-op H&P Assessment:  Brandy [name]  is a 58 y.o. (year old), female patient, seen today for interventional treatment. She  has a past surgical history that includes Breast surgery (Bilateral, 2011); Cataract extraction; Bunionectomy; Cleft palate repair; Cardiac surgery; Ablation; transthoracic echocardiogram; Cardiac catheterization; Lumbar laminectomy/decompression microdiscectomy (Left, 05/23/2016); Lumbar laminectomy/decompression microdiscectomy (Left, 05/23/2016); Shoulder injection (Left, 05/23/2016); Reduction mammaplasty (2011); Eye surgery (Right, 2019); Tubal ligation; Breast lumpectomy with radioactive seed and sentinel lymph node biopsy (Right, 11/23/2020); and IR IMAGING GUIDED PORT INSERTION (12/06/2023). Brandy [name]  has a current medication list which includes the following prescription(s): acetaminophen , albuterol , alprazolam , amitriptyline , belbuca , polyethyl glycol-propyl glycol, cyanocobalamin , repatha  sureclick, fluticasone -salmeterol, hydrocodone -acetaminophen , [START ON 01/19/2024] hydrocodone -acetaminophen , [START ON 02/18/2024] hydrocodone -acetaminophen , ketoconazole, lidocaine -prilocaine , loperamide , magnesium , methocarbamol , midodrine , multivitamin with minerals, omeprazole , PRESCRIPTION MEDICATION, triamcinolone  cream, verzenio , and vitamin d  (ergocalciferol ), and the following Facility-Administered Medications: lactated ringers . Her primarily concern today  is the No chief complaint on file.  Initial Vital Signs:  Pulse/HCG Rate: 97ECG Heart Rate: (!) 105 Temp:   Resp: 18 BP: (!) 149/76 SpO2: 100 %  BMI: Estimated body mass index is 31.32 kg/m as calculated from the following:   Height as of this encounter: 5' 7 (1.702 m).   Weight as of this encounter: 200 lb (90.7 kg).  Risk Assessment: Allergies: Reviewed. She is allergic to zithromax [azithromycin], tramadol , and  psyllium.  Allergy Precautions: None required Coagulopathies: Reviewed. None identified.  Blood-thinner therapy: None at this time Active Infection(s): Reviewed. None identified. Brandy [name]  is afebrile  Site Confirmation: Brandy [name]  was asked to confirm the procedure and laterality before marking the site Procedure checklist: Completed Consent: Before the procedure and under the influence of no sedative(s), amnesic(s), or anxiolytics, the patient was informed of the treatment options, risks and possible complications. To fulfill our ethical and legal obligations, as recommended by the American Medical Association's Code of Ethics, I have informed the patient of my clinical impression; the nature and purpose of the treatment or procedure; the risks, benefits, and possible complications of the intervention; the alternatives, including doing nothing; the risk(s) and benefit(s) of the alternative treatment(s) or procedure(s); and the risk(s) and benefit(s) of doing nothing. The patient was provided information about the general risks and possible complications associated with the procedure. These may include, but are not limited to: failure to achieve desired goals, infection, bleeding, organ or nerve damage, allergic reactions, paralysis, and death. In addition, the patient was informed of those risks and complications associated to Spine-related procedures, such as failure to decrease pain; infection (i.e.: Meningitis, epidural or intraspinal abscess);  bleeding (i.e.: epidural hematoma, subarachnoid hemorrhage, or any other type of intraspinal or peri-dural bleeding); organ or nerve damage (i.e.: Any type of peripheral nerve, nerve root, or spinal cord injury) with subsequent damage to sensory, motor, and/or autonomic systems, resulting in permanent pain, numbness, and/or weakness of one or several areas of the body; allergic reactions; (i.e.: anaphylactic reaction); and/or death. Furthermore, the patient was informed of those risks and complications associated with the medications. These include, but are not limited to: allergic reactions (i.e.: anaphylactic or anaphylactoid reaction(s)); adrenal axis suppression; blood sugar elevation that in diabetics may result in ketoacidosis or comma; water retention that in patients with history of congestive heart failure may result in shortness of breath, pulmonary edema, and decompensation with resultant heart failure; weight gain; swelling or edema; medication-induced neural toxicity; particulate matter embolism and blood vessel occlusion with resultant organ, and/or nervous system infarction; and/or aseptic necrosis of one or more joints. Finally, the patient was informed that Medicine is not an exact science; therefore, there is also the possibility of unforeseen or unpredictable risks and/or possible complications that may result in a catastrophic outcome. The patient indicated having understood very clearly. We have given the patient no guarantees and we have made no promises. Enough time was given to the patient to ask questions, all of which were answered to the patient's satisfaction. Brandy [name]  has indicated that she wanted to continue with the procedure. Attestation: I, the ordering provider, attest that I have discussed with the patient the benefits, risks, side-effects, alternatives, likelihood of achieving goals, and potential problems during recovery for the procedure that I have provided  informed consent. Date  Time: 01/15/2024 10:33 AM  Pre-Procedure Preparation:  Monitoring: As per clinic protocol. Respiration, ETCO2, SpO2, BP, heart rate and rhythm monitor placed and checked for adequate function Safety Precautions: Patient was assessed for positional comfort and pressure points before starting the procedure. Time-out: I initiated and conducted the Time-out before starting the procedure, as per protocol. The patient was asked to participate by confirming the accuracy of the Time Out information. Verification of the correct person, site, and procedure were performed and confirmed by me, the nursing staff, and the patient. Time-out conducted as per Joint Commission's Universal Protocol (UP.01.01.01). Time: 1110  Description of  Procedure:          Laterality: Bilateral Levels:  L3, L4, and L5 Medial Branch Level(s). Safety Precautions: Aspiration looking for blood return was conducted prior to all injections. At no point did we inject any substances, as a needle was being advanced. Before injecting, the patient was told to immediately notify me if she was experiencing any new onset of ringing in the ears, or metallic taste in the mouth. No attempts were made at seeking any paresthesias. Safe injection practices and needle disposal techniques used. Medications properly checked for expiration dates. SDV (single dose vial) medications used. After the completion of the procedure, all disposable equipment used was discarded in the proper designated medical waste containers. Local Anesthesia: Protocol guidelines were followed. The patient was positioned over the fluoroscopy table. The area was prepped in the usual manner. The time-out was completed. The target area was identified using fluoroscopy. A 12-in long, straight, sterile hemostat was used with fluoroscopic guidance to locate the targets for each level blocked. Once located, the skin was marked with an approved surgical skin  marker. Once all sites were marked, the skin (epidermis, dermis, and hypodermis), as well as deeper tissues (fat, connective tissue and muscle) were infiltrated with a small amount of a short-acting local anesthetic, loaded on a 10cc syringe with a 25G, 1.5-in  Needle. An appropriate amount of time was allowed for local anesthetics to take effect before proceeding to the next step. Technical description of process:  Radiofrequency Ablation (RFA) L3 Medial Branch Nerve RFA: The target area for the L3 medial branch is at the junction of the postero-lateral aspect of the superior articular process and the superior, posterior, and medial edge of the transverse process of L4. Under fluoroscopic guidance, a Radiofrequency needle was inserted until contact was made with os over the superior postero-lateral aspect of the pedicular shadow (target area). Sensory and motor testing was conducted to properly adjust the position of the needle. Once satisfactory placement of the needle was achieved, the numbing solution was slowly injected after negative aspiration for blood. 2.0 mL of the nerve block solution was injected without difficulty or complication. After waiting for at least 3 minutes, the ablation was performed. Once completed, the needle was removed intact. L4 Medial Branch Nerve RFA: The target area for the L4 medial branch is at the junction of the postero-lateral aspect of the superior articular process and the superior, posterior, and medial edge of the transverse process of L5. Under fluoroscopic guidance, a Radiofrequency needle was inserted until contact was made with os over the superior postero-lateral aspect of the pedicular shadow (target area). Sensory and motor testing was conducted to properly adjust the position of the needle. Once satisfactory placement of the needle was achieved, the numbing solution was slowly injected after negative aspiration for blood. 2.0 mL of the nerve block solution was  injected without difficulty or complication. After waiting for at least 3 minutes, the ablation was performed. Once completed, the needle was removed intact. L5 Medial Branch Nerve RFA: The target area for the L5 medial branch is at the junction of the postero-lateral aspect of the superior articular process of S1 and the superior, posterior, and medial edge of the sacral ala. Under fluoroscopic guidance, a Radiofrequency needle was inserted until contact was made with os over the superior postero-lateral aspect of the pedicular shadow (target area). Sensory and motor testing was conducted to properly adjust the position of the needle. Once satisfactory placement of the needle was  achieved, the numbing solution was slowly injected after negative aspiration for blood. 2.0 mL of the nerve block solution was injected without difficulty or complication. After waiting for at least 3 minutes, the ablation was performed. Once completed, the needle was removed intact.  Radiofrequency lesioning (ablation):  Radiofrequency Generator: Medtronic AccurianTM AG 1000 RF Generator Sensory Stimulation Parameters: 50 Hz was used to locate & identify the nerve, making sure that the needle was positioned such that there was no sensory stimulation below 0.3 V or above 0.7 V. Motor Stimulation Parameters: 2 Hz was used to evaluate the motor component. Care was taken not to lesion any nerves that demonstrated motor stimulation of the lower extremities at an output of less than 2.5 times that of the sensory threshold, or a maximum of 2.0 V. Lesioning Technique Parameters: Standard Radiofrequency settings. (Not bipolar or pulsed.) Temperature Settings: 80 degrees C Lesioning time: 60 seconds Intra-operative Compliance: Compliant  6cc solution made of 5cc of 0.2% ropivacaine , 1 cc of Decadron  10 mg/cc.  2 cc injected at each level above on the LEFT  6cc solution made of 5cc of 0.2% ropivacaine , 1 cc of Decadron  10 mg/cc.  2 cc  injected at each level above on the RIGHT  Once the entire procedure was completed, the treated area was cleaned, making sure to leave some of the prepping solution back to take advantage of its long term bactericidal properties.    Illustration of the posterior view of the lumbar spine and the posterior neural structures. Laminae of L2 through S1 are labeled. DPRL5, dorsal primary ramus of L5; DPRS1, dorsal primary ramus of S1; DPR3, dorsal primary ramus of L3; FJ, facet (zygapophyseal) joint L3-L4; I, inferior articular process of L4; LB1, lateral branch of dorsal primary ramus of L1; IAB, inferior articular branches from L3 medial branch (supplies L4-L5 facet joint); IBP, intermediate branch plexus; MB3, medial branch of dorsal primary ramus of L3; NR3, third lumbar nerve root; S, superior articular process of L5; SAB, superior articular branches from L4 (supplies L4-5 facet joint also); TP3, transverse process of L3.  Vitals:   01/15/24 1125 01/15/24 1130 01/15/24 1134 01/15/24 1143  BP: (!) 156/74 (!) 149/67 (!) 160/64 139/73  Pulse:      Resp: 18 17 18 16   SpO2: 98% 98% 96% 96%  Weight:      Height:        Start Time: 1110 hrs. End Time: 1133 hrs.  Imaging Guidance (Spinal):          Type of Imaging Technique: Fluoroscopy Guidance (Spinal) Indication(s): Assistance in needle guidance and placement for procedures requiring needle placement in or near specific anatomical locations not easily accessible without such assistance. Exposure Time: Please see nurses notes. Contrast: None used. Fluoroscopic Guidance: I was personally present during the use of fluoroscopy. Tunnel Vision Technique used to obtain the best possible view of the target area. Parallax error corrected before commencing the procedure. Direction-depth-direction technique used to introduce the needle under continuous pulsed fluoroscopy. Once target was reached, antero-posterior, oblique, and lateral fluoroscopic  projection used confirm needle placement in all planes. Images permanently stored in EMR. Interpretation: No contrast injected. I personally interpreted the imaging intraoperatively. Adequate needle placement confirmed in multiple planes. Permanent images saved into the patient's record.  Antibiotic Prophylaxis:   Anti-infectives (From admission, onward)    None      Indication(s): None identified  Post-operative Assessment:  Post-procedure Vital Signs:  Pulse/HCG Rate: 9797 Temp:   Resp: 16 BP:  139/73 SpO2: 96 %  EBL: None  Complications: No immediate post-treatment complications observed by team, or reported by patient.  Note: The patient tolerated the entire procedure well. A repeat set of vitals were taken after the procedure and the patient was kept under observation following institutional policy, for this type of procedure. Post-procedural neurological assessment was performed, showing return to baseline, prior to discharge. The patient was provided with post-procedure discharge instructions, including a section on how to identify potential problems. Should any problems arise concerning this procedure, the patient was given instructions to immediately contact us , at any time, without hesitation. In any case, we plan to contact the patient by telephone for a follow-up status report regarding this interventional procedure.  Comments:  No additional relevant information.  Plan of Care  Orders:  Orders Placed This Encounter  Procedures   DG PAIN CLIN[facility] C-ARM 1-60 MIN NO REPORT    Intraoperative interpretation by procedural physician at [facility].    Standing Status:   Standing    Number of Occurrences:   1    Reason for exam::   Assistance in needle guidance and placement for procedures requiring needle placement in or near specific anatomical locations not easily accessible without such assistance.    Medications ordered for procedure: Meds ordered this  encounter  Medications   lidocaine  (XYLOCAINE ) 2 % (with pres) injection 400 mg   lactated ringers  infusion   midazolam  (VERSED ) injection 0.5-2 mg    Make sure Flumazenil is available in the pyxis when using this medication. If oversedation occurs, administer 0.2 mg IV over 15 sec. If after 45 sec no response, administer 0.2 mg again over 1 min; may repeat at 1 min intervals; not to exceed 4 doses (1 mg)   ropivacaine  (PF) 2 mg/mL (0.2%) (NAROPIN ) injection 18 mL   dexamethasone  (DECADRON ) injection 20 mg   Medications administered: We administered lidocaine , lactated ringers , midazolam , ropivacaine  (PF) 2 mg/mL (0.2%), and dexamethasone .  See the medical record for exact dosing, route, and time of administration.  Follow-up plan:   Return for Keep sch. appt.       Interventional management options:    Lumbar RFA; right L3, L4, L5 06/01/2022, left 06/27/22. B/L L3,4,5 RFA #2 07/05/23 SCS trial; appropriate windows Sprint PNS medial branch Diagnostic left sacroiliac joint block  Diagnostic right IA hip joint injection       Recent Visits Date Type Provider Dept  12/20/23 Office Visit Patel, Seema K, NP [facility]  Showing recent visits within past 90 days and meeting all other requirements Today's Visits Date Type Provider Dept  01/15/24 Procedure visit Marcelino Nurse, MD [facility]  Showing today's visits and meeting all other requirements Future Appointments Date Type Provider Dept  03/13/24 Appointment Patel, Seema K, NP [facility]  Showing future appointments within next 90 days and meeting all other requirements  Disposition: Discharge home  Discharge (Date  Time): 01/15/2024; 1150 hrs.   Primary Care Physician: Leonarda Roxan BROCKS, NP Location: [facility] Note by: Nurse Marcelino, MD Date: 01/15/2024; Time: 12:09 PM  Disclaimer:  Medicine is not an exact science. The only guarantee in medicine is that nothing  is guaranteed. It is important to note that the decision to proceed with this intervention was based on the information collected from the patient. The Data and conclusions were drawn from the patient's questionnaire, the interview, and the physical examination. Because the information was provided in large part by  the patient, it cannot be guaranteed that it has not been purposely or unconsciously manipulated. Every effort has been made to obtain as much relevant data as possible for this evaluation. It is important to note that the conclusions that lead to this procedure are derived in large part from the available data. Always take into account that the treatment will also be dependent on availability of resources and existing treatment guidelines, considered by other Pain Management Practitioners as being common knowledge and practice, at the time of the intervention. For Medico-Legal purposes, it is also important to point out that variation in procedural techniques and pharmacological choices are the acceptable norm. The indications, contraindications, technique, and results of the above procedure should only be interpreted and judged by a Board-Certified Interventional Pain Specialist with extensive familiarity and expertise in the same exact procedure and technique.

## 2024-01-15 NOTE — Progress Notes (Signed)
 Safety precautions to be maintained throughout the outpatient stay will include: orient to surroundings, keep bed in low position, maintain call bell within reach at all times, provide assistance with transfer out of bed and ambulation.

## 2024-01-15 NOTE — Patient Instructions (Signed)

## 2024-01-16 ENCOUNTER — Telehealth: Payer: Self-pay | Admitting: *Deleted

## 2024-01-16 NOTE — Addendum Note (Signed)
 Encounter addended by: Janice Lynwood BROCKS on: 01/16/2024 9:40 AM  Actions taken: Imaging Exam ended

## 2024-01-16 NOTE — Telephone Encounter (Signed)
 Post procedure call; voicemail left

## 2024-01-18 ENCOUNTER — Inpatient Hospital Stay: Payer: Medicare Other

## 2024-01-23 ENCOUNTER — Other Ambulatory Visit: Payer: Self-pay

## 2024-01-23 DIAGNOSIS — C50919 Malignant neoplasm of unspecified site of unspecified female breast: Secondary | ICD-10-CM

## 2024-01-24 ENCOUNTER — Inpatient Hospital Stay: Admitting: Hematology

## 2024-01-24 ENCOUNTER — Inpatient Hospital Stay: Attending: Hematology

## 2024-01-24 ENCOUNTER — Inpatient Hospital Stay

## 2024-01-24 VITALS — BP 169/83 | HR 97 | Temp 98.4°F | Resp 16

## 2024-01-24 VITALS — BP 143/80 | HR 88 | Temp 97.2°F | Resp 20 | Wt 203.8 lb

## 2024-01-24 DIAGNOSIS — Z5111 Encounter for antineoplastic chemotherapy: Secondary | ICD-10-CM | POA: Diagnosis not present

## 2024-01-24 DIAGNOSIS — Z801 Family history of malignant neoplasm of trachea, bronchus and lung: Secondary | ICD-10-CM | POA: Insufficient documentation

## 2024-01-24 DIAGNOSIS — C50919 Malignant neoplasm of unspecified site of unspecified female breast: Secondary | ICD-10-CM

## 2024-01-24 DIAGNOSIS — Z87891 Personal history of nicotine dependence: Secondary | ICD-10-CM | POA: Diagnosis not present

## 2024-01-24 DIAGNOSIS — Z1721 Progesterone receptor positive status: Secondary | ICD-10-CM | POA: Insufficient documentation

## 2024-01-24 DIAGNOSIS — Z170 Estrogen receptor positive status [ER+]: Secondary | ICD-10-CM | POA: Insufficient documentation

## 2024-01-24 DIAGNOSIS — C7800 Secondary malignant neoplasm of unspecified lung: Secondary | ICD-10-CM | POA: Insufficient documentation

## 2024-01-24 DIAGNOSIS — C50511 Malignant neoplasm of lower-outer quadrant of right female breast: Secondary | ICD-10-CM | POA: Insufficient documentation

## 2024-01-24 DIAGNOSIS — Z1732 Human epidermal growth factor receptor 2 negative status: Secondary | ICD-10-CM | POA: Insufficient documentation

## 2024-01-24 LAB — CMP (CANCER CENTER ONLY)
ALT: 68 U/L — ABNORMAL HIGH (ref 0–44)
AST: 61 U/L — ABNORMAL HIGH (ref 15–41)
Albumin: 3.8 g/dL (ref 3.5–5.0)
Alkaline Phosphatase: 250 U/L — ABNORMAL HIGH (ref 38–126)
Anion gap: 4 — ABNORMAL LOW (ref 5–15)
BUN: 13 mg/dL (ref 6–20)
CO2: 32 mmol/L (ref 22–32)
Calcium: 9.2 mg/dL (ref 8.9–10.3)
Chloride: 105 mmol/L (ref 98–111)
Creatinine: 1.11 mg/dL — ABNORMAL HIGH (ref 0.44–1.00)
GFR, Estimated: 57 mL/min — ABNORMAL LOW (ref >60)
Glucose, Bld: 94 mg/dL (ref 70–99)
Potassium: 4.3 mmol/L (ref 3.5–5.1)
Sodium: 141 mmol/L (ref 135–145)
Total Bilirubin: 0.8 mg/dL (ref 0.0–1.2)
Total Protein: 7.1 g/dL (ref 6.5–8.1)

## 2024-01-24 LAB — CBC WITH DIFFERENTIAL (CANCER CENTER ONLY)
Abs Immature Granulocytes: 0.01 K/uL (ref 0.00–0.07)
Basophils Absolute: 0 K/uL (ref 0.0–0.1)
Basophils Relative: 1 %
Eosinophils Absolute: 0.1 K/uL (ref 0.0–0.5)
Eosinophils Relative: 2 %
HCT: 35.8 % — ABNORMAL LOW (ref 36.0–46.0)
Hemoglobin: 12.2 g/dL (ref 12.0–15.0)
Immature Granulocytes: 0 %
Lymphocytes Relative: 54 %
Lymphs Abs: 2.5 K/uL (ref 0.7–4.0)
MCH: 35.9 pg — ABNORMAL HIGH (ref 26.0–34.0)
MCHC: 34.1 g/dL (ref 30.0–36.0)
MCV: 105.3 fL — ABNORMAL HIGH (ref 80.0–100.0)
Monocytes Absolute: 0.3 K/uL (ref 0.1–1.0)
Monocytes Relative: 7 %
Neutro Abs: 1.7 K/uL (ref 1.7–7.7)
Neutrophils Relative %: 36 %
Platelet Count: 166 K/uL (ref 150–400)
RBC: 3.4 MIL/uL — ABNORMAL LOW (ref 3.87–5.11)
RDW: 14.2 % (ref 11.5–15.5)
WBC Count: 4.6 K/uL (ref 4.0–10.5)
nRBC: 0 % (ref 0.0–0.2)

## 2024-01-24 MED ORDER — LIDOCAINE-PRILOCAINE 2.5-2.5 % EX CREA: TOPICAL_CREAM | Freq: Once | CUTANEOUS | 3 refills | Status: AC

## 2024-01-24 MED ORDER — SODIUM CHLORIDE 0.9% FLUSH
10.0000 mL | INTRAVENOUS | Status: DC | PRN
  Administered 2024-01-24: 10 mL via INTRAVENOUS

## 2024-01-24 MED ORDER — FULVESTRANT 250 MG/5ML IM SOSY
500.0000 mg | PREFILLED_SYRINGE | Freq: Once | INTRAMUSCULAR | Status: AC
  Administered 2024-01-24: 500 mg via INTRAMUSCULAR

## 2024-01-26 ENCOUNTER — Other Ambulatory Visit: Payer: Self-pay | Admitting: Hematology

## 2024-01-26 DIAGNOSIS — C50919 Malignant neoplasm of unspecified site of unspecified female breast: Secondary | ICD-10-CM

## 2024-01-26 DIAGNOSIS — N1831 Chronic kidney disease, stage 3a: Secondary | ICD-10-CM | POA: Diagnosis not present

## 2024-01-26 DIAGNOSIS — I951 Orthostatic hypotension: Secondary | ICD-10-CM | POA: Diagnosis not present

## 2024-01-31 ENCOUNTER — Encounter: Payer: Self-pay | Admitting: Hematology

## 2024-01-31 NOTE — Progress Notes (Signed)
 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 female is here for continued valuation and management of her ER/PR positive HER2 negative breast cancer. She notes no acute new symptoms since her last clinic visit. No significant gluteal discomfort from her last dose of Faslodex . Her Breast cancer tumor markers slightly elevated therefore she had a CT chest abdomen pelvis done on 01/10/2024 which was discussed in detail.  No other acute new focal symptoms.  No new breast pain discomfort or new palpable lumps. No shortness of breath or chest pain. No significant uncontrolled diarrhea at this time. She is maintaining compliance with her Verzenio .  MEDICAL HISTORY:  Past Medical History:  Diagnosis Date   Acute pansinusitis 08/02/2017   Anxiety    Arthritis    ASD (atrial septal defect)    s/p closure with Amplatzer device 10/05/04 (Dr. DOROTHA Franky Minerva, [facility]) 10/05/04   Cancer [facility])    Cataract    Chronic pain    CKD (chronic kidney disease)    Dyspnea    Fatty liver 09/04/2019   GERD (gastroesophageal reflux disease)    Heart murmur    no longer heard   History of hiatal hernia    Hyperlipidemia    Legally blind in right eye, as defined in [place]     Lumbar herniated disc    Migraines    On home oxygen  therapy 06/15/2022   Pt was given O2 on D/C from [facility] 04/2022   OSA on CPAP 06/15/2022   PONV (postoperative nausea and vomiting)    Sciatica   Uterine Fibroids  SURGICAL HISTORY: Past Surgical History:  Procedure Laterality Date   ABLATION     BREAST  LUMPECTOMY WITH RADIOACTIVE SEED AND SENTINEL LYMPH NODE BIOPSY Right 11/23/2020   Procedure: RIGHT BREAST LUMPECTOMY WITH RADIOACTIVE SEED AND RIGHT AXILLARY SENTINEL LYMPH NODE BIOPSY;  Surgeon: Ebbie Cough, MD;  Location: [facility] OR;  Service: General;  Laterality: Right;   BREAST SURGERY Bilateral 2011   Breast Reduction Surgery   BUNIONECTOMY     CARDIAC CATHETERIZATION     10/05/04 [facility]): LM < 25%, otherwise normal coronaries. No pulmonary HTN, Mildly enlarged RV. Secundum ASD s/p closure.   CARDIAC SURGERY     CATARACT EXTRACTION     CLEFT PALATE REPAIR     s/p cleft lip and palate repair   EYE SURGERY Right 2019   right eye removed   IR IMAGING GUIDED PORT INSERTION  12/06/2023   LUMBAR LAMINECTOMY/DECOMPRESSION MICRODISCECTOMY Left 05/23/2016   Procedure: LEFT L4-L5 LATERAL RECESS DECOMPRESSION WITH CENTRAL AND RIGHT DECOMPRESSION VIA LEFT SIDE;  Surgeon: Lynwood FORBES Better, MD;  Location: [facility] OR;  Service: Orthopedics;  Laterality: Left;   LUMBAR LAMINECTOMY/DECOMPRESSION MICRODISCECTOMY Left 05/23/2016   Procedure: LUMBAR LAMINECTOMY/DECOMPRESSION MICRODISCECTOMY Lumbar five - Sacral One 1 LEVEL;  Surgeon: Lynwood FORBES Better, MD;  Location: [facility] OR;  Service: Orthopedics;  Laterality: Left;   REDUCTION MAMMAPLASTY  2011   SHOULDER INJECTION Left 05/23/2016   Procedure: SHOULDER INJECTION;  Surgeon: Lynwood FORBES Better, MD;  Location: [facility] OR;  Service: Orthopedics;  Laterality: Left;  band-aid per pa-c   TRANSTHORACIC ECHOCARDIOGRAM  12/15/05 [facility]): Mild LVH, EF > 55%, grade 1 diastolic dysfunction, Trivial MR/PR/TR.   TUBAL LIGATION    Endometrial ablation 2003 Breast Reduction, bilateral 2011  SOCIAL HISTORY: Social History   Socioeconomic History   Marital status: Married    Spouse name: Charles [name]    Number of children: 3   Years of education: 14   Highest education level: Associate degree: occupational, Scientist, product/process development, or vocational program  Occupational History   Not on file   Tobacco Use   Smoking status: Former    Current packs/day: 0.00    Average packs/day: 0.3 packs/day for 25.0 years (6.3 ttl pk-yrs)    Types: Cigarettes    Start date: 02/04/1993    Quit date: 02/04/2018    Years since quitting: 5.9   Smokeless tobacco: Never  Vaping Use   Vaping status: Never Used  Substance and Sexual Activity   Alcohol  use: No   Drug use: No   Sexual activity: Not Currently  Other Topics Concern   Not on file  Social History Narrative   Tobacco use, amount per day now: None.   Past tobacco use, amount per day: 1/4   How many years did you use tobacco: Intermittent x 20 years   Alcohol  use (drinks per week): N/A   Diet: Plant Base   Do you drink/eat things with caffeine: Coffee, Tea, Soda.   Marital status:    Married                              What year were you married? 1999   Do you live in a house, apartment, assisted living, condo, trailer, etc.? House    Is it one or more stories? 2   How many persons live in your home? 6 adults, 5 children.   Do you have pets in your home?( please list) 1 Terrier   Highest Level of education completed? AAS   Current or past profession: LPN   Do you exercise?   A little                               Type and how often? Walk, stretches.    Do you have a living will? No   Do you have a DNR form?   No                                If not, do you want to discuss one?   Do you have signed POA/HPOA forms? No                       If so, please bring to you appointment      Do you have any difficulty bathing or dressing yourself? No   Do you have any difficulty preparing food or eating? No   Do you have any difficulty managing your medications? No   Do you have any difficulty managing your finances? No   Do you have any difficulty affording your medications? No.   Social Drivers of Health   Financial Resource Strain: Low Risk  (10/18/2023)   Overall Financial Resource Strain (CARDIA)    Difficulty of Paying Living  Expenses: Not hard at all  Food Insecurity: No Food Insecurity (10/18/2023)   Hunger Vital Sign    Worried About Running  Out of Food in the Last Year: Never true    Ran Out of Food in the Last Year: Never true  Recent Concern: Food Insecurity - Food Insecurity Present (09/13/2023)   Hunger Vital Sign    Worried About Running Out of Food in the Last Year: Often true    Ran Out of Food in the Last Year: Sometimes true  Transportation Needs: No Transportation Needs (10/18/2023)   PRAPARE - Administrator, Civil Service (Medical): No    Lack of Transportation (Non-Medical): No  Physical Activity: Insufficiently Active (10/18/2023)   Exercise Vital Sign    Days of Exercise per Week: 7 days    Minutes of Exercise per Session: 20 min  Stress: No Stress Concern Present (10/18/2023)   [employer] of Occupational Health - Occupational Stress Questionnaire    Feeling of Stress : Not at all  Social Connections: Moderately Integrated (09/13/2023)   Social Connection and Isolation Panel    Frequency of Communication with Friends and Family: More than three times a week    Frequency of Social Gatherings with Friends and Family: Once a week    Attends Religious Services: More than 4 times per year    Active Member of [employer] or Organizations: Yes    Attends Banker Meetings: More than 4 times per year    Marital Status: Widowed  Intimate Partner Violence: Not At Risk (10/18/2023)   Humiliation, Afraid, Rape, and Kick questionnaire    Fear of Current or Ex-Partner: No    Emotionally Abused: No    Physically Abused: No    Sexually Abused: No    FAMILY HISTORY: Family History  Problem Relation Age of Onset   Arthritis Mother    Hypertension Mother    Diabetes Mother    High blood pressure Mother    Cancer Father        Lung   Colon cancer Neg Hx    Colon polyps Neg Hx    Esophageal cancer Neg Hx    Rectal cancer Neg Hx    Stomach cancer Neg Hx     ALLERGIES:  is  allergic to zithromax [azithromycin], tramadol , and psyllium.  MEDICATIONS:  Current Outpatient Medications  Medication Sig Dispense Refill   acetaminophen  (TYLENOL ) 500 MG tablet Take 500 mg by mouth daily as needed for moderate pain.     albuterol  (VENTOLIN  HFA) 108 (90 Base) MCG/ACT inhaler Inhale 2 puffs into the lungs every 6 (six) hours as needed for wheezing or shortness of breath. 8 g 6   ALPRAZolam  (XANAX ) 1 MG tablet TAKE 1 TABLET BY MOUTH AT  BEDTIME AS NEEDED FOR ANXIETY 30 tablet 3   amitriptyline  (ELAVIL ) 75 MG tablet Take 1 tablet (75 mg total) by mouth at bedtime. 90 tablet 3   Buprenorphine  HCl (BELBUCA ) 300 MCG FILM Place 1 Film inside cheek 2 (two) times daily as needed. Must last 30 days. 60 Film 2   Carboxymethylcellul-Glycerin (LUBRICATING EYE DROPS OP) Place 1 drop into the right eye in the morning, at noon, and at bedtime.     cyanocobalamin  (VITAMIN B12) 500 MCG tablet Take 1 tablet (500 mcg total) by mouth daily. 30 tablet 0   Evolocumab  (REPATHA  SURECLICK) 140 MG/ML SOAJ Inject 140 mg into the skin every 14 (fourteen) days. 6 mL 3   fluticasone -salmeterol (ADVAIR) 250-50 MCG/ACT AEPB Inhale 1 puff into the lungs in the morning and at bedtime. 180 each 3   HYDROcodone -acetaminophen  (NORCO/VICODIN) 5-325 MG tablet  Take 1 tablet by mouth 2 (two) times daily as needed for severe pain (pain score 7-10). Must last 30 days. 60 tablet 0   [START ON 02/18/2024] HYDROcodone -acetaminophen  (NORCO/VICODIN) 5-325 MG tablet Take 1 tablet by mouth 2 (two) times daily as needed for severe pain (pain score 7-10). Must last 30 days. 60 tablet 0   ketoconazole (NIZORAL) 2 % cream Apply 1 Application topically 2 (two) times daily.     loperamide  (IMODIUM ) 2 MG capsule Take 1-2 capsules (2-4 mg total) by mouth 4 (four) times daily as needed for diarrhea or loose stools. 30 capsule 1   magnesium  30 MG tablet Take 30 mg by mouth 2 (two) times daily.     methocarbamol  (ROBAXIN ) 500 MG tablet Take  1 tablet (500 mg total) by mouth every 6 (six) hours as needed for muscle spasms. 40 tablet 2   midodrine  (PROAMATINE ) 10 MG tablet TAKE 1 TABLET BY MOUTH IN THE  MORNING AND AT BEDTIME 180 tablet 1   Multiple Vitamin (MULTIVITAMIN WITH MINERALS) TABS tablet Take 1 tablet by mouth daily.     omeprazole  (PRILOSEC  OTC) 20 MG tablet Take 20 mg by mouth daily.     PRESCRIPTION MEDICATION CPAP- At bedtime     triamcinolone  cream (KENALOG ) 0.1 % Apply 1 Application topically 2 (two) times daily. 30 g 0   VERZENIO  100 MG tablet TAKE 1 TABLET BY MOUTH TWICE DAILY 56 tablet 0   Vitamin D , Ergocalciferol , (DRISDOL ) 1.25 MG (50000 UNIT) CAPS capsule TAKE 1 CAPSULE BY MOUTH EVERY 7  DAYS ( MONDAY ) 5 capsule 9   No current facility-administered medications for this visit.    REVIEW OF SYSTEMS:    .10 Point review of Systems was done is negative except as noted above.   PHYSICAL EXAMINATION: ECOG FS:1 - Symptomatic but completely ambulatory  Vitals:   01/24/24 1448 01/24/24 1500  BP: (!) 158/81 (!) 143/80  Pulse: 88   Resp: 20   Temp: (!) 97.2 F (36.2 C)   SpO2: 98%     Wt Readings from Last 3 Encounters:  01/24/24 203 lb 12.8 oz (92.4 kg)  01/15/24 200 lb (90.7 kg)  12/27/23 205 lb 1.6 oz (93 kg)   Body mass index is 31.92 kg/m.   SABRA GENERAL:alert, in no acute distress and comfortable SKIN: no acute rashes, no significant lesions EYES: conjunctiva are pink and non-injected, sclera anicteric OROPHARYNX: MMM, no exudates, no oropharyngeal erythema or ulceration NECK: supple, no JVD LYMPH:  no palpable lymphadenopathy in the cervical, axillary or inguinal regions LUNGS: clear to auscultation b/l with normal respiratory effort HEART: regular rate & rhythm ABDOMEN:  normoactive bowel sounds , non tender, not distended. Extremity: no pedal edema PSYCH: alert & oriented x 3 with fluent speech NEURO: no focal motor/sensory deficits  LABORATORY DATA:  I have reviewed the data as  listed     Latest Ref Rng & Units 01/24/2024    2:06 PM 12/27/2023   12:07 PM 11/20/2023    1:30 PM  CBC  WBC 4.0 - 10.5 K/uL 4.6  4.6  4.3   Hemoglobin 12.0 - 15.0 g/dL 87.7  89.0  87.8   Hematocrit 36.0 - 46.0 % 35.8  31.9  35.8   Platelets 150 - 400 K/uL 166  185  194        Latest Ref Rng & Units 01/24/2024    2:06 PM 12/27/2023   12:07 PM 11/20/2023    1:30 PM  CMP  Glucose 70 - 99 mg/dL 94  97  87   BUN 6 - 20 mg/dL 13  15  11    Creatinine 0.44 - 1.00 mg/dL 8.88  8.70  8.79   Sodium 135 - 145 mmol/L 141  141  140   Potassium 3.5 - 5.1 mmol/L 4.3  3.8  4.1   Chloride 98 - 111 mmol/L 105  105  104   CO2 22 - 32 mmol/L 32  31  30   Calcium  8.9 - 10.3 mg/dL 9.2  9.4  9.4   Total Protein 6.5 - 8.1 g/dL 7.1  7.0  7.1   Total Bilirubin 0.0 - 1.2 mg/dL 0.8  0.6  0.8   Alkaline Phos 38 - 126 U/L 250  212  239   AST 15 - 41 U/L 61  78  69   ALT 0 - 44 U/L 68  57  92          RADIOGRAPHIC STUDIES: I have personally reviewed the radiological images as listed and agreed with the findings in the report. CT CHEST ABDOMEN PELVIS W CONTRAST Result Date: 01/23/2024 CLINICAL DATA:  Metastatic breast cancer assess treatment response * Tracking Code: BO * EXAM: CT CHEST, ABDOMEN, AND PELVIS WITH CONTRAST TECHNIQUE: Multidetector CT imaging of the chest, abdomen and pelvis was performed following the standard protocol during bolus administration of intravenous contrast. RADIATION DOSE REDUCTION: This exam was performed according to the departmental dose-optimization program which includes automated exposure control, adjustment of the mA and/or kV according to patient size and/or use of iterative reconstruction technique. CONTRAST:  OMNIPAQUE  IOHEXOL  300 MG/ML  SOLN COMPARISON:  05/19/2023 FINDINGS: CT CHEST FINDINGS Cardiovascular: Right chest port catheter. Normal heart size. No pericardial effusion. Mediastinum/Nodes: No enlarged mediastinal, hilar, or axillary lymph nodes. Thyroid  gland,  trachea, and esophagus demonstrate no significant findings. Lungs/Pleura: Unchanged 0.7 cm nodule of the peripheral right apex (series 6, image 31). Unchanged 0.4 cm nodule in the posterior left apex (series 6, image 29). Background of fine tiny nodules, concentrated in the lung apices. No pleural effusion or pneumothorax. Musculoskeletal: Status post right lumpectomy and axillary lymph node dissection. No acute osseous findings. CT ABDOMEN PELVIS FINDINGS Hepatobiliary: No solid liver abnormality is seen. Tiny gallstones. No gallbladder wall thickening, or biliary dilatation. Pancreas: Unremarkable. No pancreatic ductal dilatation or surrounding inflammatory changes. Spleen: Normal in size without significant abnormality. Adrenals/Urinary Tract: Adrenal glands are unremarkable. Kidneys are normal, without renal calculi, solid lesion, or hydronephrosis. Bladder is unremarkable. Stomach/Bowel: Stomach is within normal limits. Appendix appears normal. No evidence of bowel wall thickening, distention, or inflammatory changes. Moderate burden of stool throughout the colon. Vascular/Lymphatic: Aortic atherosclerosis. No enlarged abdominal or pelvic lymph nodes. Reproductive: No mass or other abnormality. Other: No abdominal wall hernia or abnormality. No ascites. Musculoskeletal: No acute osseous findings. IMPRESSION: 1. Unchanged 0.7 cm nodule of the peripheral right apex. Unchanged 0.4 cm nodule in the posterior left apex. Background of fine tiny nodules, concentrated in the lung apices. 2. Status post right lumpectomy and axillary lymph node dissection. 3. No evidence of lymphadenopathy or metastatic disease in the abdomen or pelvis. 4. Cholelithiasis. Aortic Atherosclerosis (ICD10-I70.0). Electronically Signed   By: Marolyn JONETTA Jaksch M.D.   On: 01/23/2024 21:43   DG PAIN CLINIC C-ARM 1-60 MIN NO REPORT Result Date: 01/15/2024 Fluoro was used, but no Radiologist interpretation will be provided. Please refer to NOTES  tab for provider progress note.   MM DIAG BREAST TOMO BILATERAL  Result  Date: 01/04/2022 CLINICAL DATA:  Patient has a history of metastatic right breast cancer diagnosed in October of 2020. Patient is status post right breast lumpectomy in June of 2022. EXAM: DIGITAL DIAGNOSTIC BILATERAL MAMMOGRAM WITH TOMOSYNTHESIS TECHNIQUE: Bilateral digital diagnostic mammography and breast tomosynthesis was performed. COMPARISON:  Previous exam(s). ACR Breast Density Category b: There are scattered areas of fibroglandular density. FINDINGS: Cc and MLO views of bilateral breasts are submitted. Postsurgical changes are identified in the right breast. The left breast is stable. IMPRESSION: Benign findings. RECOMMENDATION: Bilateral diagnostic mammogram in 1 year. I have discussed the findings and recommendations with the patient. If applicable, a reminder letter will be sent to the patient regarding the next appointment. BI-RADS CATEGORY  2: Benign. Electronically Signed   By: Craig Farr M.D.   On: 01/04/2022 16:27    09/02/2020 Surgical Pathology     ASSESSMENT & PLAN:   Brandy [name]  is a 58 y.o. female with:  1. Metastatic breast cancer ER+/PRneg/Her2 neg  02/28/2019 neck CT with results revealing negative for mass or adenopathy in the neck.  03/04/2019 head MRI with results revealing Negative for metastatic disease.  No acute abnormality in the brain.  01/04/2022 Mammogram benign  2. Pulmonary metastases  02/18/2019 chest and abdomen with results revealing 5cm spiculated soft tissue mass in inferior right breast, highly suspicious for primary breast carcinoma. No acute findings or metastatic disease within the abdomen or pelvis. Multiple small pulmonary nodules in both lung bases, consistent with pulmonary metastases. 4.5 cm uterine fibroid.  02/28/2019 C/A/P CT with results revealing Irregular solid 5.0 cm right breast mass, suspicious for primary right breast malignancy.  Innumerable solid pulmonary nodules scattered throughout both lungs, compatible with pulmonary metastases. No evidence of metastatic disease in the abdomen, pelvis or skeleton. Mildly enlarged and probably myomatous uterus. Simple 1.4 cm left adnexal cyst requires no follow-up. This recommendation follows ACR consensus guidelines: White Paper of the ACR Incidental Findings Committee II on Adnexal Findings. J Am Coll Radiol [id number]. Aortic Atherosclerosis (ICD10-I70.0).  NUCLEAR MEDICINE WHOLE BODY BONE SCAN completed on 03/19/2019 with results revealing 1. No scintigraphic evidence skeletal metastasis. 2. 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   04/09/2019 Bone Density ([id number]) which revealed The BMD measured at Femur Neck Right is 1.153 g/cm2 with a T-score of 0.8. This patient is considered normal according to World Health Organization [facility]) criteria. Lumbar spine was not utilized due to advanced degenerative changes. The scan quality is good. Femur Neck Right 04/09/2019 54.7 Normal 0.8 1.153 g/cm2. Left Forearm Radius 33% 04/09/2019 54.7 Normal 0.8 0.949 g/cm2.  08/04/2019 CT Angio Chest ([id number]) revealed 1. No lobar or central pulmonary embolus detected. Exam is limited secondary to respiratory motion. 2. Mild ground-glass attenuation may represent mild pneumonitis or areas of air trapping. 3. Signs of atrial septal closure. 4. Decrease in size and number of bilateral pulmonary nodules, marked response noted on today's exam with the only nodule remaining near a cm in the right upper lobe and the smaller nodules that were present on the previous examination throughout the chest no longer measurable though the lower lobes are limited by respiratory motion. 5. Decreased size of right breast mass. 6. Probable hepatic steatosis.  S/p rt breast lumpectomy on 6/20 -- no residual carcinoma on pathology.  3. Elevated LFTs 2/2 extensive  hepatic steatosis -Under the care of Dr. Shila and last seen on 03/26/2020 -07/03/2019 US  Abd revealed No acute findings. Normal gallbladder.  No bile duct dilation. 2. Significant increased liver parenchymal echogenicity consistent with extensive hepatic steatosis.  PLAN:  -Discussed lab results done today and 01/24/2024 CBC stable CMP shows chronic elevation of her transaminases due to fatty liver. CT chest abdomen pelvis done on 01/10/2024 shows stable 0.7 cm nodule in the right lung apex and unchanged 0.4 cm nodule in the posterior left apex.  No other evidence of metastatic disease.  No evidence of cancer progression. Continue Verzenio  at 100 mg p.o. twice daily Continue monthly Faslodex  at this time -reasonable to continue mammograms once a year Again reiterated that she should follow-up with her primary care physician to address all metabolic factors involved with her hepatic steatohepatitis.  FOLLOW-UP:  Plz schedule monthly faslodex  x 12 with portflush and portflush labs  Return to clinic with Dr. Onesimo with port flush and labs in 3 months  The total time spent in the appointment was 32 minutes*.  All of the patient's questions were answered with apparent satisfaction. The patient knows to call the clinic with any problems, questions or concerns.   Emaline Onesimo MD MS AAHIVMS [facility] Hematology/Oncology Physician [facility]  .*Total Encounter Time as defined by the Centers for Medicare and Medicaid Services includes, in addition to the face-to-face time of a patient visit (documented in the note above) non-face-to-face time: obtaining and reviewing outside history, ordering and reviewing medications, tests or procedures, care coordination (communications with other health care professionals or caregivers) and documentation in the medical record.

## 2024-02-07 ENCOUNTER — Other Ambulatory Visit: Payer: Self-pay | Admitting: Family

## 2024-02-07 DIAGNOSIS — F5101 Primary insomnia: Secondary | ICD-10-CM

## 2024-02-07 MED ORDER — ALPRAZOLAM 1 MG PO TABS: 1.0000 mg | ORAL_TABLET | Freq: Every day | ORAL | 0 refills | Status: DC

## 2024-02-07 NOTE — Telephone Encounter (Signed)
 Patient to sign treatment agreement on upcoming visit.

## 2024-02-07 NOTE — Telephone Encounter (Signed)
 Requesting refill. I don't see a treatment agreement on file. It was last filled on 01/01/24 in the amount of 30 tablets with 3 refills.

## 2024-02-07 NOTE — Telephone Encounter (Unsigned)
 Copied from CRM #[id number]. Topic: Clinical - Medication Refill >> Feb 07, 2024  8:27 AM Laurier C wrote: Medication: ALPRAZolam  (XANAX ) 1 MG tablet  Has the patient contacted their pharmacy? Yes  This is the patient's preferred pharmacy: [facility] [address], [place] - [address] [place] [address] Phone: [phone] Fax: [phone]  Is this the correct pharmacy for this prescription? No If no, delete pharmacy and type the correct one.   Has the prescription been filled recently? No  Is the patient out of the medication? Yes  Has the patient been seen for an appointment in the last year OR does the patient have an upcoming appointment? Yes  Can we respond through MyChart? Yes  Agent: Please be advised that Rx refills may take up to 3 business days. We ask that you follow-up with your pharmacy.

## 2024-02-12 ENCOUNTER — Inpatient Hospital Stay

## 2024-02-12 ENCOUNTER — Inpatient Hospital Stay: Payer: Self-pay

## 2024-02-13 ENCOUNTER — Other Ambulatory Visit: Payer: Self-pay | Admitting: Family

## 2024-02-13 DIAGNOSIS — F322 Major depressive disorder, single episode, severe without psychotic features: Secondary | ICD-10-CM

## 2024-02-16 DIAGNOSIS — M1990 Unspecified osteoarthritis, unspecified site: Secondary | ICD-10-CM | POA: Diagnosis not present

## 2024-02-16 DIAGNOSIS — D631 Anemia in chronic kidney disease: Secondary | ICD-10-CM | POA: Diagnosis not present

## 2024-02-16 DIAGNOSIS — G629 Polyneuropathy, unspecified: Secondary | ICD-10-CM | POA: Diagnosis not present

## 2024-02-16 DIAGNOSIS — N183 Chronic kidney disease, stage 3 unspecified: Secondary | ICD-10-CM | POA: Diagnosis not present

## 2024-02-16 DIAGNOSIS — Z87891 Personal history of nicotine dependence: Secondary | ICD-10-CM | POA: Diagnosis not present

## 2024-02-16 DIAGNOSIS — H16212 Exposure keratoconjunctivitis, left eye: Secondary | ICD-10-CM | POA: Diagnosis not present

## 2024-02-16 DIAGNOSIS — Z91018 Allergy to other foods: Secondary | ICD-10-CM | POA: Diagnosis not present

## 2024-02-16 DIAGNOSIS — Z7951 Long term (current) use of inhaled steroids: Secondary | ICD-10-CM | POA: Diagnosis not present

## 2024-02-16 DIAGNOSIS — E785 Hyperlipidemia, unspecified: Secondary | ICD-10-CM | POA: Diagnosis not present

## 2024-02-16 DIAGNOSIS — K219 Gastro-esophageal reflux disease without esophagitis: Secondary | ICD-10-CM | POA: Diagnosis not present

## 2024-02-16 DIAGNOSIS — I2510 Atherosclerotic heart disease of native coronary artery without angina pectoris: Secondary | ICD-10-CM | POA: Diagnosis not present

## 2024-02-16 DIAGNOSIS — G4730 Sleep apnea, unspecified: Secondary | ICD-10-CM | POA: Diagnosis not present

## 2024-02-16 DIAGNOSIS — Z9989 Dependence on other enabling machines and devices: Secondary | ICD-10-CM | POA: Diagnosis not present

## 2024-02-16 DIAGNOSIS — J45909 Unspecified asthma, uncomplicated: Secondary | ICD-10-CM | POA: Diagnosis not present

## 2024-02-16 DIAGNOSIS — Z885 Allergy status to narcotic agent status: Secondary | ICD-10-CM | POA: Diagnosis not present

## 2024-02-16 DIAGNOSIS — H189 Unspecified disorder of cornea: Secondary | ICD-10-CM | POA: Diagnosis not present

## 2024-02-16 DIAGNOSIS — Z881 Allergy status to other antibiotic agents status: Secondary | ICD-10-CM | POA: Diagnosis not present

## 2024-02-16 DIAGNOSIS — I129 Hypertensive chronic kidney disease with stage 1 through stage 4 chronic kidney disease, or unspecified chronic kidney disease: Secondary | ICD-10-CM | POA: Diagnosis not present

## 2024-02-16 DIAGNOSIS — G43909 Migraine, unspecified, not intractable, without status migrainosus: Secondary | ICD-10-CM | POA: Diagnosis not present

## 2024-02-16 DIAGNOSIS — K760 Fatty (change of) liver, not elsewhere classified: Secondary | ICD-10-CM | POA: Diagnosis not present

## 2024-02-16 DIAGNOSIS — H02535 Eyelid retraction left lower eyelid: Secondary | ICD-10-CM | POA: Diagnosis not present

## 2024-02-16 DIAGNOSIS — G894 Chronic pain syndrome: Secondary | ICD-10-CM | POA: Diagnosis not present

## 2024-02-16 DIAGNOSIS — H02536 Eyelid retraction left eye, unspecified eyelid: Secondary | ICD-10-CM | POA: Diagnosis not present

## 2024-02-16 DIAGNOSIS — J984 Other disorders of lung: Secondary | ICD-10-CM | POA: Diagnosis not present

## 2024-02-16 DIAGNOSIS — G4700 Insomnia, unspecified: Secondary | ICD-10-CM | POA: Diagnosis not present

## 2024-02-19 ENCOUNTER — Other Ambulatory Visit: Payer: Self-pay

## 2024-02-19 ENCOUNTER — Inpatient Hospital Stay: Payer: Medicare Other

## 2024-02-19 DIAGNOSIS — C50919 Malignant neoplasm of unspecified site of unspecified female breast: Secondary | ICD-10-CM

## 2024-02-21 ENCOUNTER — Inpatient Hospital Stay: Attending: Hematology

## 2024-02-21 VITALS — BP 138/63 | HR 86 | Temp 97.7°F | Resp 18

## 2024-02-21 DIAGNOSIS — Z5111 Encounter for antineoplastic chemotherapy: Secondary | ICD-10-CM | POA: Diagnosis not present

## 2024-02-21 DIAGNOSIS — C50919 Malignant neoplasm of unspecified site of unspecified female breast: Secondary | ICD-10-CM

## 2024-02-21 DIAGNOSIS — Z1721 Progesterone receptor positive status: Secondary | ICD-10-CM | POA: Diagnosis not present

## 2024-02-21 DIAGNOSIS — C7800 Secondary malignant neoplasm of unspecified lung: Secondary | ICD-10-CM | POA: Diagnosis not present

## 2024-02-21 DIAGNOSIS — Z170 Estrogen receptor positive status [ER+]: Secondary | ICD-10-CM | POA: Diagnosis not present

## 2024-02-21 DIAGNOSIS — Z1732 Human epidermal growth factor receptor 2 negative status: Secondary | ICD-10-CM | POA: Insufficient documentation

## 2024-02-21 DIAGNOSIS — C50511 Malignant neoplasm of lower-outer quadrant of right female breast: Secondary | ICD-10-CM | POA: Diagnosis not present

## 2024-02-21 LAB — CMP (CANCER CENTER ONLY)
ALT: 62 U/L — ABNORMAL HIGH (ref 0–44)
AST: 69 U/L — ABNORMAL HIGH (ref 15–41)
Albumin: 3.6 g/dL (ref 3.5–5.0)
Alkaline Phosphatase: 266 U/L — ABNORMAL HIGH (ref 38–126)
Anion gap: 4 — ABNORMAL LOW (ref 5–15)
BUN: 12 mg/dL (ref 6–20)
CO2: 33 mmol/L — ABNORMAL HIGH (ref 22–32)
Calcium: 9 mg/dL (ref 8.9–10.3)
Chloride: 103 mmol/L (ref 98–111)
Creatinine: 1.27 mg/dL — ABNORMAL HIGH (ref 0.44–1.00)
GFR, Estimated: 49 mL/min — ABNORMAL LOW (ref >60)
Glucose, Bld: 122 mg/dL — ABNORMAL HIGH (ref 70–99)
Potassium: 4 mmol/L (ref 3.5–5.1)
Sodium: 140 mmol/L (ref 135–145)
Total Bilirubin: 0.6 mg/dL (ref 0.0–1.2)
Total Protein: 7 g/dL (ref 6.5–8.1)

## 2024-02-21 LAB — CBC WITH DIFFERENTIAL (CANCER CENTER ONLY)
Abs Immature Granulocytes: 0.01 K/uL (ref 0.00–0.07)
Basophils Absolute: 0 K/uL (ref 0.0–0.1)
Basophils Relative: 1 %
Eosinophils Absolute: 0.1 K/uL (ref 0.0–0.5)
Eosinophils Relative: 2 %
HCT: 33.7 % — ABNORMAL LOW (ref 36.0–46.0)
Hemoglobin: 11.3 g/dL — ABNORMAL LOW (ref 12.0–15.0)
Immature Granulocytes: 0 %
Lymphocytes Relative: 53 %
Lymphs Abs: 1.8 K/uL (ref 0.7–4.0)
MCH: 35.6 pg — ABNORMAL HIGH (ref 26.0–34.0)
MCHC: 33.5 g/dL (ref 30.0–36.0)
MCV: 106.3 fL — ABNORMAL HIGH (ref 80.0–100.0)
Monocytes Absolute: 0.3 K/uL (ref 0.1–1.0)
Monocytes Relative: 9 %
Neutro Abs: 1.2 K/uL — ABNORMAL LOW (ref 1.7–7.7)
Neutrophils Relative %: 35 %
Platelet Count: 174 K/uL (ref 150–400)
RBC: 3.17 MIL/uL — ABNORMAL LOW (ref 3.87–5.11)
RDW: 13.9 % (ref 11.5–15.5)
WBC Count: 3.4 K/uL — ABNORMAL LOW (ref 4.0–10.5)
nRBC: 0 % (ref 0.0–0.2)

## 2024-02-21 MED ORDER — FULVESTRANT 250 MG/5ML IM SOSY
500.0000 mg | PREFILLED_SYRINGE | Freq: Once | INTRAMUSCULAR | Status: AC
  Administered 2024-02-21: 500 mg via INTRAMUSCULAR
  Filled 2024-02-21: qty 10

## 2024-02-22 ENCOUNTER — Encounter (HOSPITAL_BASED_OUTPATIENT_CLINIC_OR_DEPARTMENT_OTHER): Payer: Self-pay | Admitting: Pulmonary Disease

## 2024-02-22 ENCOUNTER — Ambulatory Visit (HOSPITAL_BASED_OUTPATIENT_CLINIC_OR_DEPARTMENT_OTHER): Admitting: Pulmonary Disease

## 2024-02-22 VITALS — BP 117/63 | HR 92 | Ht 67.0 in | Wt 205.0 lb

## 2024-02-22 DIAGNOSIS — G4733 Obstructive sleep apnea (adult) (pediatric): Secondary | ICD-10-CM | POA: Diagnosis not present

## 2024-02-22 DIAGNOSIS — R0602 Shortness of breath: Secondary | ICD-10-CM | POA: Diagnosis not present

## 2024-02-22 LAB — CANCER ANTIGEN 15-3: CA 15-3: 53.7 U/mL — ABNORMAL HIGH (ref 0.0–25.0)

## 2024-02-22 LAB — CANCER ANTIGEN 27.29: CA 27.29: 69 U/mL — ABNORMAL HIGH (ref 0.0–38.6)

## 2024-02-22 NOTE — Patient Instructions (Signed)
  Shortness of breath - prior covid infection, improved but not active Deconditioning --CONTINUE Advair Diskus 250-50 mcg ONE puff TWICE a day. REFILL to [facility] --CONTINUE Albuterol  AS NEEDED for shortness of breath --Use nebulizers AS NEEDED for exacerbations  OSA Patient switching to [facility] Sleep study 07/14/21 AHI 6.2 Nadi SpO2 81% --Reviewed CPAP compliance report. Good with exception with machine non working --Will fax order, sleep test and recent note to new DME for auto CPAP 5-20 cm H20

## 2024-02-22 NOTE — Progress Notes (Signed)
 Subjective:   PATIENT ID: Brandy [name]  GENDER: female DOB: 10/01/1965, MRN: [id number]   HPI  Chief Complaint  Patient presents with   Follow-up    OSA    Reason for Visit: Follow-up  [name]  is a 58 year old female former smoker with metastatic breast cancer, obesity, chronic diastolic heart failure, chronic pain, anxiety, ASD s/p post-repair in 2006 and insomnia who presents for follow-up  Synopsis: Initially referred for evaluation of shortness of breath. PFTs with mild restrictive defect and reduced DLCO. On verzenio  and faslodex  for metastatic breast cancer 2021 - Methacholine  challenge negative. Prior pulmonary nodules decreased in size since 02/2019, likely representing mets post-treatment. CT with no ILD. 2022 - S/p right lumpectomy in June with no breast cancer progression. Worsening dyspnea with unclear cause.Some coughing, nonproductive. Tried Breo which was ineffective. 2023 - Dx with OSA and on CPAP. Hospitalized in Nov for electrolyte issues and discharged on home O2. Ambulatory O2 in Dec with no desaturations.  05/18/22 Since our last visit she was started on CPAP and followed by Cardiology. She reports she is compliant with therapy. Last month she was hospitalized for electrolyte issues and discharged on home O2. Her last potassium was normal which Oncology monitors monthly. Reports worsening fatigue. Has productive cough. She is compliant with Advair but does not feels this help. She reports that walking short distances will cause her be winded. No wheezing. Has increased her O2 but unknown what her saturations are.  08/15/22 Since our last visit she was hospitalized from 07/22/22-07/29/22 for covid 19 pneumonia and treated with co-comitant bacterial pneumonia. Discharged on 2L O2. Will wear at home when she feels short of breath. She did not need O2 prior to this hospitalization and did not need any during pulmonary rehab. She is planning to  return to pulmonary rehab. She is stressed and planning to leave her husband and moving out soon.  12/14/22 Overall respiratory smptoms is good with current inhalers. Completed pulm rehab in March. Walking her dog daily and exercising twice a day with walks. The humidity will affect her breathing but no problems reported. She is compliant with her CPAP nightly and reports >4 hours. Does have some leakage because her straps are loose. Has not received her CPAP supplies due to wrong DME reported.  07/03/23 Since our last visit she is doing well on Advair Diskus. Does not use albuterol  often. She has not been active lately but planning for re-enroll into [employer]. Has had someone else walking her dog. Wearing CPAP nightly for >8 hours with improved qualify of sleep.  02/22/24 Since our last visit she is overall doing well on Advair. Rarely uses rescue inhaler. Her main concern today is that her CPAP which is 59 years old no longer works. She has tried to call to service it but her DME has changed to [facility] and she needs us  to assist in changing DME companies and ordering a new device. Denies shortness of breath, cough or wheezing       No data to display         Social History: Quit smoking in 2020. 1/4 ppd x 25 years.  Stressed about her husband, poor housing situation  Past Medical History:  Diagnosis Date   Acute pansinusitis 08/02/2017   Anxiety    Arthritis    ASD (atrial septal defect)    s/p closure with Amplatzer device 10/05/04 (Dr. DOROTHA Franky Minerva, [facility]) 10/05/04   Cancer (HCC)  Cataract    Chronic pain    CKD (chronic kidney disease)    Dyspnea    Fatty liver 09/04/2019   GERD (gastroesophageal reflux disease)    Heart murmur    no longer heard   History of hiatal hernia    Hyperlipidemia    Legally blind in right eye, as defined in [place]     Lumbar herniated disc    Migraines    On home oxygen  therapy 06/15/2022   Pt was given O2 on D/C from [facility] 04/2022   OSA  on CPAP 06/15/2022   PONV (postoperative nausea and vomiting)    Sciatica      Allergies  Allergen Reactions   Zithromax [Azithromycin] Shortness Of Breath, Itching and Other (See Comments)    TOTAL BODY ITCHING [EVEN SOLES OF FEET] WHEEZING    Tramadol  Itching and Other (See Comments)    Has taken recently without any side effects.   Psyllium Nausea And Vomiting and Other (See Comments)    Metamucil. Sneezing       Outpatient Medications Prior to Visit  Medication Sig Dispense Refill   acetaminophen  (TYLENOL ) 500 MG tablet Take 500 mg by mouth daily as needed for moderate pain.     albuterol  (VENTOLIN  HFA) 108 (90 Base) MCG/ACT inhaler Inhale 2 puffs into the lungs every 6 (six) hours as needed for wheezing or shortness of breath. 8 g 6   ALPRAZolam  (XANAX ) 1 MG tablet Take 1 tablet (1 mg total) by mouth at bedtime. 30 tablet 0   amitriptyline  (ELAVIL ) 75 MG tablet TAKE 1 TABLET BY MOUTH AT  BEDTIME 30 tablet 11   Buprenorphine  HCl (BELBUCA ) 300 MCG FILM Place 1 Film inside cheek 2 (two) times daily as needed. Must last 30 days. 60 Film 2   Carboxymethylcellul-Glycerin (LUBRICATING EYE DROPS OP) Place 1 drop into the right eye in the morning, at noon, and at bedtime.     cyanocobalamin  (VITAMIN B12) 500 MCG tablet Take 1 tablet (500 mcg total) by mouth daily. 30 tablet 0   Evolocumab  (REPATHA  SURECLICK) 140 MG/ML SOAJ Inject 140 mg into the skin every 14 (fourteen) days. 6 mL 3   fluticasone -salmeterol (ADVAIR) 250-50 MCG/ACT AEPB Inhale 1 puff into the lungs in the morning and at bedtime. 180 each 3   HYDROcodone -acetaminophen  (NORCO/VICODIN) 5-325 MG tablet Take 1 tablet by mouth 2 (two) times daily as needed for severe pain (pain score 7-10). Must last 30 days. 60 tablet 0   ketoconazole (NIZORAL) 2 % cream Apply 1 Application topically 2 (two) times daily.     loperamide  (IMODIUM ) 2 MG capsule Take 1-2 capsules (2-4 mg total) by mouth 4 (four) times daily as needed for diarrhea or  loose stools. 30 capsule 1   magnesium  30 MG tablet Take 30 mg by mouth 2 (two) times daily.     methocarbamol  (ROBAXIN ) 500 MG tablet Take 1 tablet (500 mg total) by mouth every 6 (six) hours as needed for muscle spasms. 40 tablet 2   midodrine  (PROAMATINE ) 10 MG tablet TAKE 1 TABLET BY MOUTH IN THE  MORNING AND AT BEDTIME 180 tablet 1   Multiple Vitamin (MULTIVITAMIN WITH MINERALS) TABS tablet Take 1 tablet by mouth daily.     omeprazole  (PRILOSEC  OTC) 20 MG tablet Take 20 mg by mouth daily.     PRESCRIPTION MEDICATION CPAP- At bedtime     triamcinolone  cream (KENALOG ) 0.1 % Apply 1 Application topically 2 (two) times daily. 30 g 0   VERZENIO   100 MG tablet TAKE 1 TABLET BY MOUTH TWICE DAILY 56 tablet 0   Vitamin D , Ergocalciferol , (DRISDOL ) 1.25 MG (50000 UNIT) CAPS capsule TAKE 1 CAPSULE BY MOUTH EVERY 7  DAYS ( MONDAY ) 5 capsule 9   No facility-administered medications prior to visit.    Review of Systems  Constitutional:  Negative for chills, diaphoresis, fever, malaise/fatigue and weight loss.  HENT:  Negative for congestion.   Respiratory:  Negative for cough, hemoptysis, sputum production, shortness of breath and wheezing.   Cardiovascular:  Negative for chest pain, palpitations and leg swelling.    Objective:   Vitals:   02/22/24 1423  BP: 117/63  Pulse: 92  SpO2: 97%  Weight: 205 lb (93 kg)  Height: 5' 7 (1.702 m)  SpO2: 97 %   Physical Exam: General: Well-appearing, no acute distress HENT: [place], AT, OP clear, MMM, s/p cleft palate repair, right eyepatch in place Eyes: EOMI, no scleral icterus Respiratory: Clear to auscultation bilaterally.  No crackles, wheezing or rales Cardiovascular: RRR, -M/R/G, no JVD GI: BS+, soft, nontender Extremities:-Edema,-tenderness Neuro: AAO x4, CNII-XII grossly intact  Data Reviewed:  Imaging: CTA 10/30/19 - No pulmonary embolism. RUL 10x4mm, 5mm, unchanged. RLL nodule 4mm new CT Chest 05/21/20 - No evidence of ILD. 1.4 x 1.0 x  0.8 cm aggressive appearing nodule near the apex of the right upper lobe PET 08/07/20 - 8 mm irregular nodule in right apex improved compared to CT 2020 PET 05/13/22 - 05/13/22 - Stable 8 mm nodule in the RUL, stable LUL 3mm. CT Chest 07/26/22 - Bilateral pulmonary infiltates R>L CT CAP 05/19/23 - Stable 4 mm LUL nodule and 7 mm RUL nodule, mild LLL atelectasis CT CAP 01/10/24 - Stable 4mm LUL nodule and 7 mm RUL nodule. No metastatic disease  PFT: 12/18/19 FVC 3.13 (83%) FEV1 2.49 (92%) Ratio 88  TLC 78% DLCO 58% Interpretation: Mild restrictive defect with moderate reduction in gas exchange  02/17/20 Negative methacholine  challenge  04/30/21 FVC 2.64 (85%) FEV1 2.32 (94%) Ratio 89  TLC 80% DLCO 59% Interpretation: Isolated moderate reduction in gas exchange  Sleep: Sleep 07/14/21 - Mild OSA AHI 6.2. Nadir SpO2 81%  Echocardiogram: 09/2019 Normal EF, grade I DD, mild MR.  06/03/21 EF 60-65%. Normal diastolics. No valvular or WMA  Coronary CT 06/17/21 - Calcium  score of 0  CBC    Component Value Date/Time   WBC 3.4 (L) 02/21/2024 1402   WBC 3.8 10/09/2023 1108   RBC 3.17 (L) 02/21/2024 1402   HGB 11.3 (L) 02/21/2024 1402   HGB 12.5 05/06/2022 0958   HCT 33.7 (L) 02/21/2024 1402   HCT 37.2 05/06/2022 0958   PLT 174 02/21/2024 1402   PLT 288 05/06/2022 0958   MCV 106.3 (H) 02/21/2024 1402   MCV 105 (H) 05/06/2022 0958   MCH 35.6 (H) 02/21/2024 1402   MCHC 33.5 02/21/2024 1402   RDW 13.9 02/21/2024 1402   RDW 13.8 05/06/2022 0958   LYMPHSABS 1.8 02/21/2024 1402   LYMPHSABS 3.2 (H) 07/28/2021 0916   MONOABS 0.3 02/21/2024 1402   EOSABS 0.1 02/21/2024 1402   EOSABS 0.1 07/28/2021 0916   BASOSABS 0.0 02/21/2024 1402   BASOSABS 0.0 07/28/2021 0916   Absolute eos  01/22/21 - 300 05/03/21 - 0 06/01/21 - 0 07/28/21 -100 04/22/22 -0  CPAP compliance per phone app 06/03/23-07/02/23 >4 hours 25/30 days (83.3%) AHI 0.067  12/16/23-01/14/24 Usage days 21/30 days (70%) >4 hours  19/30 days (63%) Avg usage 6 hours 8 min Auto  CPAP 6-16 cm H20 AHI 4.1    Assessment & Plan:   Discussion: 58 year old female former smoker (6 pack-years) with metastatic breast cancer s/p right lumpectomy with pulmonary mets on Verzenio  and Faslodex , HTN, chronic respiratory failure, CKD III who presents for follow-up. Well controlled DOE on bronchodilators. Compliant with CPAP until it broke down.  Breo - d/c'd due to ineffectiveness  Shortness of breath - prior covid infection, improved but not active Deconditioning --Prior methacholine  challenge which was negative for asthma --CONTINUE Advair Diskus 250-50 mcg ONE puff TWICE a day. REFILL to [facility] --CONTINUE Albuterol  AS NEEDED for shortness of breath --Use nebulizers AS NEEDED for exacerbations --Graduated Pulmonary Rehab in 06/2022-08/2022 --Encourage regular regular aerobic exercise daily up to 20-30 minutes  OSA Patient switching to [facility] Sleep study 07/14/21 AHI 6.2 Nadi SpO2 81% --Reviewed CPAP compliance report. Good with exception with machine non working --Will fax order, sleep test and recent note to new DME for auto CPAP 5-20 cm H20  Nocturnal hypoxemia 2/2 OSA Isolated moderate DLCO --Echocardiogram neg --Low suspicion for PH. If above treatment neg, could consider RHC but the diagnosis is less likely in absence of hypoxemia  Health Maintenance Immunization History  Administered Date(s) Administered   Hepatitis A 03/02/2023   Hepatitis B 03/02/2023   Influenza Inj Mdck Quad Pf 04/25/2019   Influenza,inj,Quad PF,6+ Mos 02/24/2020, 04/15/2022, 02/21/2023   Influenza,inj,quad, With Preservative 04/25/2019   Influenza-Unspecified 05/26/2021   Moderna Sars-Covid-2 Vaccination 06/29/2019, 08/03/2019, 06/24/2020   PFIZER(Purple Top)SARS-COV-2 Vaccination 05/26/2021   Pneumococcal Conjugate-13 05/29/2019   Pneumococcal Polysaccharide-23 07/29/2019   Tdap 02/21/2023   Unspecified SARS-COV-2 Vaccination 02/21/2023    Zoster Recombinant(Shingrix) 02/17/2020, 04/20/2020   CT Lung Screen - not qualified  No orders of the defined types were placed in this encounter.  No orders of the defined types were placed in this encounter.  Return in about 3 months (around 05/23/2024).   I have spent a total time of 30-minutes on the day of the appointment including chart review, data review, collecting history, coordinating care and discussing medical diagnosis and plan with the patient/family. Past medical history, allergies, medications were reviewed. Pertinent imaging, labs and tests included in this note have been reviewed and interpreted independently by me.  Loyd Salvador Slater Staff, MD [facility] 02/22/2024 3:02 PM

## 2024-02-22 NOTE — Addendum Note (Signed)
 Addended by: Allyssia Skluzacek O on: 02/22/2024 03:10 PM   Modules accepted: Orders

## 2024-03-01 ENCOUNTER — Other Ambulatory Visit: Payer: Self-pay | Admitting: Hematology

## 2024-03-01 DIAGNOSIS — C50919 Malignant neoplasm of unspecified site of unspecified female breast: Secondary | ICD-10-CM

## 2024-03-06 ENCOUNTER — Telehealth: Payer: Self-pay | Admitting: Pharmacy Technician

## 2024-03-06 NOTE — Telephone Encounter (Signed)
 Pharmacy Patient Advocate Encounter  Received notification from [employer] that Prior Authorization for repatha  has been APPROVED from 03/06/24 to 06/05/25   PA #/Case ID/Reference #: [id number]

## 2024-03-06 NOTE — Telephone Encounter (Signed)
   Pharmacy Patient Advocate Encounter   Received notification from Pt Calls Messages that prior authorization for repatha  is required/requested.   Insurance verification completed.   The patient is insured through [facility].   Per test claim: PA required; PA submitted to above mentioned insurance via Latent Key/confirmation #/EOC [id number] Status is pending

## 2024-03-13 ENCOUNTER — Ambulatory Visit: Attending: Nurse Practitioner | Admitting: Nurse Practitioner

## 2024-03-13 ENCOUNTER — Encounter: Payer: Self-pay | Admitting: Nurse Practitioner

## 2024-03-13 VITALS — BP 141/79 | HR 97 | Temp 97.2°F | Resp 18 | Ht 67.0 in | Wt 201.0 lb

## 2024-03-13 DIAGNOSIS — G894 Chronic pain syndrome: Secondary | ICD-10-CM | POA: Diagnosis not present

## 2024-03-13 DIAGNOSIS — M4310 Spondylolisthesis, site unspecified: Secondary | ICD-10-CM | POA: Insufficient documentation

## 2024-03-13 DIAGNOSIS — M47816 Spondylosis without myelopathy or radiculopathy, lumbar region: Secondary | ICD-10-CM | POA: Diagnosis not present

## 2024-03-13 DIAGNOSIS — Z79899 Other long term (current) drug therapy: Secondary | ICD-10-CM | POA: Insufficient documentation

## 2024-03-13 DIAGNOSIS — M961 Postlaminectomy syndrome, not elsewhere classified: Secondary | ICD-10-CM | POA: Diagnosis not present

## 2024-03-13 MED ORDER — HYDROCODONE-ACETAMINOPHEN 5-325 MG PO TABS: 1.0000 | ORAL_TABLET | Freq: Two times a day (BID) | ORAL | 0 refills | Status: AC | PRN

## 2024-03-13 MED ORDER — BELBUCA 300 MCG BU FILM: 1.0000 | ORAL_FILM | Freq: Two times a day (BID) | BUCCAL | 2 refills | Status: AC | PRN

## 2024-03-13 NOTE — Progress Notes (Signed)
 PROVIDER NOTE: Interpretation of information contained herein should be left to medically-trained personnel. Specific patient instructions are provided elsewhere under Patient Instructions section of medical record. This document was created in part using AI and STT-dictation technology, any transcriptional errors that may result from this process are unintentional.  Patient: Brandy [name]   Service: E/M   PCP: Leonarda Roxan BROCKS, NP  DOB: 07-Oct-1965  DOS: 03/13/2024  Provider: Emmy MARLA Blanch, NP  MRN: [id number]  Delivery: Face-to-face  Specialty: Interventional Pain Management  Type: Established Patient  Setting: Ambulatory outpatient facility  Specialty designation: 09  Referring Prov.: Ngetich, Roxan BROCKS, NP  Location: Outpatient office facility       History of present illness (HPI) Brandy [name] , a 58 y.o. year old female, is here today because of her low back pain. Brandy [name] 's primary complain today is Back Pain (Lower Back)  Pertinent problems: Brandy [name]   has Spinal stenosis, Lumbar region with neurogenic claudication; Post laminectomy syndrome; CKD (chronic kidney disease), stage III (HCC); Depression; DDD (degenerative disc disease), lumbosacral; Grade 1 retrolithiasis (L4-5, L5-S1); Lumbar facet syndrome (Multilevel) (Bilateral) (R>L); Neurogenic pain; spondylosis without myelopathy or radiculopathy, lumbosacral region, and Chronic pain syndrome on their pertinent problem list.  Pain Assessment: Severity of Chronic pain is reported as a 3 /10. Location: Back Lower/Radiates to Bilateral hips. Onset: More than a month ago. Quality: Aching, Tender. Timing: Constant. Modifying factor(s): Medication and Rest. Vitals:  height is 5' 7 (1.702 m) and weight is 201 lb (91.2 kg). Her temporal temperature is 97.2 F (36.2 C) (abnormal). Her blood pressure is 141/79 (abnormal) and her pulse is 97. Her respiration is 18 and oxygen  saturation is 97%.   BMI: Estimated body mass index is 31.48 kg/m as calculated from the following:   Height as of this encounter: 5' 7 (1.702 m).   Weight as of this encounter: 201 lb (91.2 kg).  Last encounter: 12/20/2023. Last procedure: Visit date not found.  Reason for encounter: both, medication management and post-procedure evaluation and assessment. No change in medical history since last visit.  Patient's pain is at baseline.  Patient continues multimodal pain regimen as prescribed.  States that it provides pain relief and improvement in functional status.   Brandy [name]  underwent a therapeutic/palliative lumbar facet radiofrequency ablation (RFA) on January 15, 2024.  She reports initially 25% pain relief, followed sustained ongoing 95% pain relief and functional improvement since the procedure.  The patient reports that lumbar radiofrequency ablation (RFA) begin providing significant pain relief after 2 weeks, with ongoing improvement in function.   Procedure Type: Lumbar Facet, Medial Branch Radiofrequency Ablation (RFA) #3  Laterality: Bilateral  Level: L3, L4, and L5 Medial Branch Level(s).  Imaging: Fluoroscopy-guided         Anesthesia: Local anesthesia (1-2% Lidocaine ) Anxiolysis: IV Versed          DOS: 01/15/2024  Performed by: Wallie Sherry, MD   Purpose: Therapeutic/Palliative Indications: Low back pain severe enough to impact quality of life or function. Indications: 1. Lumbar facet syndrome (Multilevel) (Bilateral) (R>L)   2. Lumbar facet hypertrophy (Multilevel) (Bilateral)     Brandy [name]  has been dealing with the above chronic pain for longer than three months and has either failed to respond, was unable to tolerate, or simply did not get enough benefit from other more conservative therapies including, but not limited to: 1. Over-the-counter medications 2. Anti-inflammatory medications 3. Muscle relaxants 4. Membrane stabilizers 5. Opioids 6. Physical  therapy and/or chiropractic manipulation 7. Modalities (Heat, ice, etc.) 8. Invasive techniques such as nerve blocks. Brandy [name]  has attained more than 50% relief of the pain from a series of diagnostic injections conducted in separate occasions.   Pain Score: Pre-procedure: 8 /10 Post-procedure: 8 /10  Post-Procedure Evaluation    Effectiveness:  Initial hour after procedure: 0 % . Subsequent 4-6 hours post-procedure: 25 % . Analgesia past initial 6 hours: 95 % . Ongoing improvement:  Analgesic:  Brandy [name]  underwent a therapeutic/palliative lumbar facet radiofrequency ablation (RFA) on January 15, 2024.  She reports initially 25% pain relief, followed sustained ongoing 95% pain relief and functional improvement since the procedure.  The patient reports that lumbar radiofrequency ablation (RFA) begin providing significant pain relief after 2 weeks, with ongoing improvement in function.  Function: Brandy [name]  reports improvement in function ROM: Brandy [name]  reports improvement in ROM   Pharmacotherapy Assessment   Hydrocodone  - acetaminophen  5-325 mg TID for 30 days for break through pain. MME=10 Buprenorphine  HCl (BELBUCA ) 300 mcg Film BID.  Monitoring: [place] PMP: PDMP reviewed during this encounter.       Pharmacotherapy: No side-effects or adverse reactions reported. Compliance: No problems identified. Effectiveness: Clinically acceptable.  Erlene Doyal SAUNDERS, [place]  03/13/2024  8:48 AM  Sign when Signing Visit Nursing Pain Medication Assessment:  Safety precautions to be maintained throughout the outpatient stay will include: orient to surroundings, keep bed in low position, maintain call bell within reach at all times, provide assistance with transfer out of bed and ambulation.  Medication Inspection Compliance: Pill count conducted under aseptic conditions, in front of the patient. Neither the pills nor the bottle was removed from the  patient's sight at any time. Once count was completed pills were immediately returned to the patient in their original bottle.  Medication: Hydrocodone /APAP Pill/Patch Count: 39 of 60 pills/patches remain Pill/Patch Appearance: Markings consistent with prescribed medication Bottle Appearance: Standard pharmacy container. Clearly labeled. Filled Date: 07 / 21 / 2025 Last Medication intake:  Today Nursing Pain Medication Assessment:  Safety precautions to be maintained throughout the outpatient stay will include: orient to surroundings, keep bed in low position, maintain call bell within reach at all times, provide assistance with transfer out of bed and ambulation.  Medication Inspection Compliance: Pill count conducted under aseptic conditions, in front of the patient. Neither the pills nor the bottle was removed from the patient's sight at any time. Once count was completed pills were immediately returned to the patient in their original bottle.  Medication: Belbuca  Pill/Patch Count: 12 of 60 pills/patches remain Pill/Patch Appearance: Markings consistent with prescribed medication Bottle Appearance: Standard pharmacy container. Clearly labeled. Filled Date: 09 / 04 / 2025 Last Medication intake:  Today    UDS:  Summary  Date Value Ref Range Status  09/12/2023 FINAL  Final    Comment:    ==================================================================== [employer] Select 13 (MW) ==================================================================== Specimen Alert Not Detected result may be consistent with the time of last use noted for this medication. AS NEEDED (Hydrocodone ) ==================================================================== Test                             Result       Flag       Units  Drug Present and Declared for Prescription Verification   Alpha-hydroxyalprazolam        162          EXPECTED   ng/mg  creat    Alpha-hydroxyalprazolam is an expected metabolite  of alprazolam .    Source of alprazolam  is a scheduled prescription medication.    Buprenorphine                   27           EXPECTED   ng/mg creat   Norbuprenorphine               47           EXPECTED   ng/mg creat    Source of buprenorphine  is a scheduled prescription medication.    Norbuprenorphine is an expected metabolite of buprenorphine .  Drug Absent but Declared for Prescription Verification   Hydrocodone                     Not Detected UNEXPECTED ng/mg creat ==================================================================== Test                      Result    Flag   Units      Ref Range   Creatinine              78               mg/dL      >=79 ==================================================================== Declared Medications:  The flagging and interpretation on this report are based on the  following declared medications.  Unexpected results may arise from  inaccuracies in the declared medications.   **Note: The testing scope of this panel includes these medications:   Alprazolam  (Xanax )  Hydrocodone  (Norco)   **Note: The testing scope of this panel does not include small to  moderate amounts of these reported medications:   Buprenorphine  (Belbuca )   **Note: The testing scope of this panel does not include the  following reported medications:   Abemaciclib  (Verzenio )  Acetaminophen  (Tylenol )  Acetaminophen  (Norco)  Albuterol  (Ventolin  HFA)  Amitriptyline  (Elavil )  Evolocumab  (Repatha )  Eye Drops  Fluticasone  (Advair)  Ketoconazole (Nizoral)  Loperamide  (Imodium )  Methocarbamol  (Robaxin )  Midodrine  (Proamatine )  Multivitamin  Omeprazole  (Prilosec )  Salmeterol (Advair)  Triamcinolone  (Kenalog )  Vitamin B12  Vitamin D2 (Drisdol ) ==================================================================== For clinical consultation, please call [phone]. ====================================================================     No results found for:  CBDTHCR No results found for: D8THCCBX No results found for: D9THCCBX  ROS  Constitutional: Denies any fever or chills Gastrointestinal: No reported hemesis, hematochezia, vomiting, or acute GI distress Musculoskeletal: low back pain  Neurological: No reported episodes of acute onset apraxia, aphasia, dysarthria, agnosia, amnesia, paralysis, loss of coordination, or loss of consciousness  Medication Review  ALPRAZolam , Buprenorphine  HCl, Evolocumab , HYDROcodone -acetaminophen , PRESCRIPTION MEDICATION, Polyethyl Glycol-Propyl Glycol, Vitamin D  (Ergocalciferol ), abemaciclib , acetaminophen , albuterol , amitriptyline , cyanocobalamin , fluticasone -salmeterol, ketoconazole, lidocaine -prilocaine , loperamide , magnesium , methocarbamol , midodrine , multivitamin with minerals, neomycin-polymyxin b -dexamethasone , omeprazole , and triamcinolone  cream  History Review  Allergy: Brandy [name]  is allergic to zithromax [azithromycin], tramadol , and psyllium. Drug: Brandy [name]   reports no history of drug use. Alcohol :  reports no history of alcohol  use. Tobacco:  reports that she quit smoking about 6 years ago. Her smoking use included cigarettes. She started smoking about 31 years ago. She has a 6.3 pack-year smoking history. She has never used smokeless tobacco. Social: Brandy [name]   reports that she quit smoking about 6 years ago. Her smoking use included cigarettes. She started smoking about 31 years ago. She has a 6.3 pack-year smoking history. She has never used smokeless tobacco. She reports that she does not  drink alcohol  and does not use drugs. Medical:  has a past medical history of Acute pansinusitis (08/02/2017), Anxiety, Arthritis, ASD (atrial septal defect), Cancer (HCC), Cataract, Chronic pain, CKD (chronic kidney disease), Dyspnea, Fatty liver (09/04/2019), GERD (gastroesophageal reflux disease), Heart murmur, History of hiatal hernia, Hyperlipidemia, Legally blind  in right eye, as defined in [place] , Lumbar herniated disc, Migraines, On home oxygen  therapy (06/15/2022), OSA on CPAP (06/15/2022), PONV (postoperative nausea and vomiting), and Sciatica. Surgical: Brandy [name]   has a past surgical history that includes Breast surgery (Bilateral, 2011); Cataract extraction; Bunionectomy; Cleft palate repair; Cardiac surgery; Ablation; transthoracic echocardiogram; Cardiac catheterization; Lumbar laminectomy/decompression microdiscectomy (Left, 05/23/2016); Lumbar laminectomy/decompression microdiscectomy (Left, 05/23/2016); Shoulder injection (Left, 05/23/2016); Reduction mammaplasty (2011); Eye surgery (Right, 2019); Tubal ligation; Breast lumpectomy with radioactive seed and sentinel lymph node biopsy (Right, 11/23/2020); and IR IMAGING GUIDED PORT INSERTION (12/06/2023). Family: family history includes Arthritis in her mother; Cancer in her father; Diabetes in her mother; High blood pressure in her mother; Hypertension in her mother.  Laboratory Chemistry Profile   Renal Lab Results  Component Value Date   BUN 12 02/21/2024   CREATININE 1.27 (H) 02/21/2024   LABCREA 123.4 10/27/2021   BCR 9 10/09/2023   GFRAA >60 02/24/2020   GFRNONAA 49 (L) 02/21/2024    Hepatic Lab Results  Component Value Date   AST 69 (H) 02/21/2024   ALT 62 (H) 02/21/2024   ALBUMIN  3.6 02/21/2024   ALKPHOS 266 (H) 02/21/2024   LIPASE 35 07/12/2021    Electrolytes Lab Results  Component Value Date   NA 140 02/21/2024   K 4.0 02/21/2024   CL 103 02/21/2024   CALCIUM  9.0 02/21/2024   MG 1.1 (L) 09/13/2023   PHOS 2.6 04/26/2022    Bone Lab Results  Component Value Date   VD25OH 30.51 06/26/2019   VD125OH2TOT 62 05/28/2021   [id number] <8 05/28/2021   VD2125OH2 62 05/28/2021   25OHVITD1 50 11/27/2019   25OHVITD2 46 11/27/2019   25OHVITD3 4.1 11/27/2019    Inflammation (CRP: Acute Phase) (ESR: Chronic Phase) Lab Results  Component Value Date   CRP 5.6 (H)  07/28/2022   ESRSEDRATE 60 (H) 03/23/2023   LATICACIDVEN 1.5 07/23/2022         Note: Above Lab results reviewed.  Recent Imaging Review  MR Lumbar Spine w/o contrast   Narrative CLINICAL DATA:  Spinal stenosis, lumbar. Low back pain radiating to bilateral sides/legs with numbness and weakness in bilateral legs for years. History of lumbar spine laminectomy in 2017.   EXAM: MRI LUMBAR SPINE WITHOUT CONTRAST   TECHNIQUE: Multiplanar, multisequence MR imaging of the lumbar spine was performed. No intravenous contrast was administered.   COMPARISON:  MR lumbar 01/13/2017; X-ray lumbar 08/26/2021.   FINDINGS: Segmentation:  5 lumbar vertebra   Alignment: 4 mm anterolisthesis L2-3 is unchanged. 2 mm anterolisthesis L3-4 unchanged. 4 mm retrolisthesis L4-5 and L5-S1 also unchanged.   Vertebrae: Negative for fracture or mass. Normal appearing bone marrow.   Conus medullaris and cauda equina: Conus extends to the L2-3 level. Conus and cauda equina appear normal.   Paraspinal and other soft tissues: Negative for paraspinous mass, adenopathy, fluid collection   Disc levels:   L1-2: Minimal disc degeneration. Moderate to advanced facet degeneration. Interval development of posterior epidural synovial cyst measuring 7 x 8 mm. This flattens the posterior thecal sac but is not causing significant spinal stenosis. Generous size spinal canal.   L2-3: Anterolisthesis. Moderate to advanced facet degeneration bilaterally. No significant  stenosis   L3-4: Mild anterolisthesis. Shallow right foraminal disc protrusion unchanged from the prior study. Mild disc degeneration and moderate facet degeneration. Negative for stenosis.   L4-5: Left laminotomy. Diffuse bulging of the disc and mild facet degeneration. Mild left subarticular stenosis, unchanged. Spinal canal adequate in size   L5-S1: Left laminotomy. Retrolisthesis with disc degeneration and spurring. Bilateral facet  degeneration. Mild subarticular stenosis bilaterally. No interval change.   IMPRESSION: Postop laminectomy left L4-5 and L5-S1. No recurrent disc protrusion. Subarticular stenosis left L4-5 unchanged. Subarticular stenosis bilaterally L5-S1 unchanged   Multilevel facet degeneration. Interval development of 7 x 8 mm synovial cyst at L1-2 projecting into the posterior epidural space in the midline. This is not causing significant spinal stenosis.     Electronically Signed By: Carlin Gaskins M.D. On: 09/29/2021 10:49   MR Lumbar Spine W Wo Contrast   Narrative CLINICAL DATA:  Intermittent back pain for 5 years, bilateral lower extremity burning and numbness. Follow-up radiculopathy.   EXAM: MRI LUMBAR SPINE WITHOUT AND WITH CONTRAST   TECHNIQUE: Multiplanar and multiecho pulse sequences of the lumbar spine were obtained without and with intravenous contrast.   CONTRAST:  17mL MULTIHANCE  GADOBENATE DIMEGLUMINE  529 MG/ML IV SOLN   COMPARISON:  MRI of the lumbar spine March 06, 2016 and lumbar spine radiographs August 25, 2016   FINDINGS: SEGMENTATION: For the purposes of this report, the last well-formed intervertebral disc will be reported as L5-S1.   ALIGNMENT: Straightened lumbar lordosis. Minimal grade 1 L2-3 anterolisthesis. Minimal grade 1 L4-5 retrolisthesis. Grade 1 L5-S1 retrolisthesis.   VERTEBRAE:Vertebral bodies are intact. Moderate to severe L5-S1 disc height loss, progressed from prior MRI. Mild desiccation lower lumbar discs. Mild chronic discogenic endplate changes L5-S1. Similar low T1, bright STIR signal single segment upper coccyx, without definite enhancement though, not tailored for evaluation. No abnormal or acute lumbar spine bone marrow signal or enhancement. No abnormal disc enhancement.   CONUS MEDULLARIS: Conus medullaris terminates at L1-2 and demonstrates normal morphology and signal characteristics. Cauda equina is normal. No abnormal  cord, leptomeningeal or epidural enhancement.   PARASPINAL AND SOFT TISSUES: Included prevertebral and paraspinal soft tissues are nonacute. Moderate LEFT sacral paraspinal denervation/atrophy.   DISC LEVELS:   T12-L1, L1-2: No disc bulge, canal stenosis nor neural foraminal narrowing. Mild to moderate facet arthropathy.   L2-3: Anterolisthesis. Unroofing of the disc without disc bulge. 4 mm RIGHT facet synovial cyst decreased from 9 mm, decreased fluid component. Moderate to severe RIGHT, moderate LEFT facet arthropathy and ligamentum flavum redundancy without canal stenosis or neural foraminal narrowing.   L3-4: Small broad-based RIGHT extraforaminal disc protrusion unchanged. Moderate to severe facet arthropathy without canal stenosis or neural foraminal narrowing.   L4-5: Retrolisthesis. LEFT hemilaminectomy. Homogeneous enhancing granulation tissue within the surgical bed, no fluid collection. Small LEFT subarticular disc protrusion and annular fissure persists. No canal stenosis. Mild LEFT neural foraminal narrowing.   L5-S1: Bilateral L5-S1 laminectomies. Homogeneous enhancing granulation tissue within the surgical bed, no focal fluid collection. Mild facet arthropathy. No canal stenosis. Mild LEFT neural foraminal narrowing.   IMPRESSION: 1. Status post LEFT L4-5 and bilateral L5-S1 laminectomies. 2. Grade 1 L2-3 anterolisthesis, grade 1 L4-5 retrolisthesis, grade 1 L5-S1 retrolisthesis. 3. No canal stenosis. Mild LEFT L4-5 and L5-S1 neural foraminal narrowing. 4. Similar abnormal upper coccyx bone marrow signal, nonspecific and incompletely characterized.     Electronically Signed By: Minerva Salle M.D. On: 01/14/2017 00:23   DG Lumbar Spine 1 View   Narrative  CLINICAL DATA:  Left L4-5 decompression.   EXAM: LUMBAR SPINE - 1 VIEW   COMPARISON:  05/23/2016 intraoperative lumbar spine radiographs .   FINDINGS: There is a posterior approach surgical  marking device with the tip overlying the spinal canal at the level of the L5-S1 disc. Additional surgical instruments overlie the posterior lower back soft tissues at the L4-S1 levels.   IMPRESSION: Posterior approach surgical marking device position as described.     Electronically Signed By: Selinda DELENA Blue M.D. On: 05/23/2016 10:57  Physical Exam  Vitals: BP (!) 141/79 (BP Location: Left Arm, Patient Position: Sitting)   Pulse 97   Temp (!) 97.2 F (36.2 C) (Temporal)   Resp 18   Ht 5' 7 (1.702 m)   Wt 201 lb (91.2 kg)   SpO2 97%   BMI 31.48 kg/m  BMI: Estimated body mass index is 31.48 kg/m as calculated from the following:   Height as of this encounter: 5' 7 (1.702 m).   Weight as of this encounter: 201 lb (91.2 kg). Ideal: Ideal body weight: 61.6 kg (135 lb 12.9 oz) Adjusted ideal body weight: 73.4 kg (161 lb 14.1 oz) General appearance: Well nourished, well developed, and well hydrated. In no apparent acute distress Mental status: Alert, oriented x 3 (person, place, & time)       Respiratory: No evidence of acute respiratory distress Eyes: PERLA  Musculoskeletal:  +Low back pain Assessment   Diagnosis Status  1. Lumbar facet syndrome (Multilevel) (Bilateral) (R>L)   2. Lumbar facet hypertrophy (Multilevel) (Bilateral)   3. Chronic pain syndrome   4. Failed back surgical syndrome   5. Grade 1 Retrolisthesis (L4-5, L5-S1)   6. Medication management    Improving Improving Stable   Updated Problems: No problems updated.  Plan of Care  Problem-specific:  Assessment and Plan Lumbar facet syndrome: Lumbar facet syndrome is a condition that affects the facet joints in the lower back (lumbar spine) and remain a facet joint is injured, pain signals from the facet joints traveling sensory nerves, the medial branches to the spinal cord and then to the brain.  The patient has undergone multiple conservative treatments; however lumbar radiofrequency ablation has  provided better pain relief and improved functional outcomes.   Chronic pain syndrome:  Patient's pain is well-controlled with hydrocodone  and Belbuca  film, we will continue on current medication regimen.  Prescribing drug monitoring (PDMP) reviewed, findings consistent with the use of prescribed medication and no evidence of narcotic misuse or abuse.  Urine drug screening (UDS) up to date and consistent with the use of prescribed medication.  Advised patient to continue with water therapy for better functional abilities.  Schedule follow-up in 60 days for medication management.  Brandy [name]  has a current medication list which includes the following long-term medication(s): albuterol , amitriptyline , fluticasone -salmeterol, and omeprazole .  Pharmacotherapy (Medications Ordered): Meds ordered this encounter  Medications   HYDROcodone -acetaminophen  (NORCO/VICODIN) 5-325 MG tablet    Sig: Take 1 tablet by mouth 2 (two) times daily as needed for severe pain (pain score 7-10). Must last 30 days.    Dispense:  60 tablet    Refill:  0    Must last 30 days   Buprenorphine  HCl (BELBUCA ) 300 MCG FILM    Sig: Place 1 Film inside cheek 2 (two) times daily as needed. Must last 30 days.    Dispense:  60 Film    Refill:  2    Chronic Pain: STOP Act (Not applicable) Fill  1 day early if closed on refill date. Avoid benzodiazepines within 8 hours of opioids   Orders:  No orders of the defined types were placed in this encounter.       Return in about 2 months (around 05/13/2024) for (F2F), (MM), Emmy Blanch NP.    Recent Visits Date Type Provider Dept  01/15/24 Procedure visit Marcelino Nurse, MD [facility]  12/20/23 Office Visit Dulcemaria Bula K, NP [facility]  Showing recent visits within past 90 days and meeting all other requirements Today's Visits Date Type Provider Dept  03/13/24 Office Visit Loye Reininger K, NP [facility]  Showing today's  visits and meeting all other requirements Future Appointments Date Type Provider Dept  05/09/24 Appointment Tyjai Charbonnet K, NP [facility]  Showing future appointments within next 90 days and meeting all other requirements  I discussed the assessment and treatment plan with the patient. The patient was provided an opportunity to ask questions and all were answered. The patient agreed with the plan and demonstrated an understanding of the instructions.  Patient advised to call back or seek an in-person evaluation if the symptoms or condition worsens.  I personally spent a total of 30 minutes in the care of the patient today including preparing to see the patient, getting/reviewing separately obtained history, performing a medically appropriate exam/evaluation, counseling and educating, placing orders, referring and communicating with other health care professionals, documenting clinical information in the EHR, independently interpreting results, communicating results, and coordinating care.   Note by: Emmy MARLA Blanch, NP Date: 03/13/2024; Time: 9:01 AM

## 2024-03-13 NOTE — Progress Notes (Signed)
 Nursing Pain Medication Assessment:  Safety precautions to be maintained throughout the outpatient stay will include: orient to surroundings, keep bed in low position, maintain call bell within reach at all times, provide assistance with transfer out of bed and ambulation.  Medication Inspection Compliance: Pill count conducted under aseptic conditions, in front of the patient. Neither the pills nor the bottle was removed from the patient's sight at any time. Once count was completed pills were immediately returned to the patient in their original bottle.  Medication: Hydrocodone /APAP Pill/Patch Count: 39 of 60 pills/patches remain Pill/Patch Appearance: Markings consistent with prescribed medication Bottle Appearance: Standard pharmacy container. Clearly labeled. Filled Date: 07 / 21 / 2025 Last Medication intake:  Today Nursing Pain Medication Assessment:  Safety precautions to be maintained throughout the outpatient stay will include: orient to surroundings, keep bed in low position, maintain call bell within reach at all times, provide assistance with transfer out of bed and ambulation.  Medication Inspection Compliance: Pill count conducted under aseptic conditions, in front of the patient. Neither the pills nor the bottle was removed from the patient's sight at any time. Once count was completed pills were immediately returned to the patient in their original bottle.  Medication: Belbuca  Pill/Patch Count: 12 of 60 pills/patches remain Pill/Patch Appearance: Markings consistent with prescribed medication Bottle Appearance: Standard pharmacy container. Clearly labeled. Filled Date: 09 / 04 / 2025 Last Medication intake:  Today

## 2024-03-14 ENCOUNTER — Encounter: Payer: Self-pay | Admitting: Family

## 2024-03-14 ENCOUNTER — Ambulatory Visit: Admitting: Family

## 2024-03-14 VITALS — BP 138/80 | HR 86 | Temp 97.8°F | Resp 19 | Ht 67.0 in | Wt 209.4 lb

## 2024-03-14 DIAGNOSIS — L309 Dermatitis, unspecified: Secondary | ICD-10-CM | POA: Diagnosis not present

## 2024-03-14 DIAGNOSIS — F322 Major depressive disorder, single episode, severe without psychotic features: Secondary | ICD-10-CM

## 2024-03-14 DIAGNOSIS — F411 Generalized anxiety disorder: Secondary | ICD-10-CM

## 2024-03-14 DIAGNOSIS — J449 Chronic obstructive pulmonary disease, unspecified: Secondary | ICD-10-CM

## 2024-03-14 DIAGNOSIS — E782 Mixed hyperlipidemia: Secondary | ICD-10-CM

## 2024-03-14 DIAGNOSIS — I10 Essential (primary) hypertension: Secondary | ICD-10-CM

## 2024-03-14 DIAGNOSIS — E0849 Diabetes mellitus due to underlying condition with other diabetic neurological complication: Secondary | ICD-10-CM | POA: Diagnosis not present

## 2024-03-14 DIAGNOSIS — N1832 Chronic kidney disease, stage 3b: Secondary | ICD-10-CM

## 2024-03-14 DIAGNOSIS — Z23 Encounter for immunization: Secondary | ICD-10-CM

## 2024-03-14 MED ORDER — TRIAMCINOLONE ACETONIDE 0.1 % EX CREA: TOPICAL_CREAM | Freq: Two times a day (BID) | CUTANEOUS | 0 refills | Status: DC

## 2024-03-14 MED ORDER — KETOCONAZOLE 2 % EX CREA: 1.0000 | TOPICAL_CREAM | Freq: Two times a day (BID) | CUTANEOUS | 3 refills | Status: DC

## 2024-03-14 NOTE — Patient Instructions (Signed)
 1.Go to local pharmacy to receive Covid,Flu,and Hepatitis B vaccines.

## 2024-03-14 NOTE — Progress Notes (Signed)
 Provider: Roxan Plough FNP-C   [name], Roxan BROCKS, NP  Patient Care Team: Sally Menard, Roxan BROCKS, NP as PCP - General (Family Medicine) Tobb, Kardie, DO as PCP - Cardiology (Cardiology) Cristopher Suzen HERO, NP as Nurse Practitioner  Extended Emergency Contact Information Primary Emergency Contact: [name] ,[name] Address: [address]          [place], [place] [address] [place] Home Phone: [phone] Relation: Daughter Secondary Emergency Contact: Tucker,[name] Mobile Phone: [phone] Relation: Other Preferred language: English  Code Status:  Full code  Goals of care: Advanced Directive information    03/14/2024    8:41 AM  Advanced Directives  Does Patient Have a Medical Advance Directive? No  Would patient like information on creating a medical advance directive? No - Patient declined     Chief Complaint  Patient presents with   Medical Management of Chronic Issues    6 Month follow up.      Discussed the use of AI scribe software for clinical note transcription with the patient, who gave verbal consent to proceed.  History of Present Illness   Brandy [name]  is a 58 year old female with chronic pain and arthritis who presents for a six-month follow-up.  She experiences persistent issues with her lower back and hip joints. An ablation procedure initially improved her symptoms, but she continues to experience tightness in her lower back muscles and a sensation of her hip joints 'locking up'.  She injured her left ankle during an aqua fitness class. The ankle, which has a history of old fractures that have developed into arthritis, is swollen and painful with intermittent episodes of increased pain. She alternates between a soft and hard brace to manage the swelling and pain. A cortisone injection in the left ankle was ineffective, described as 'just like water'. She reports swelling and increased pain in the left ankle, but does not  report redness or weakness.  She experiences issues with her nails, particularly the thumb, which keeps chipping and lifting. She attributes this to her cancer treatment, noting that her nails grow and then come off. She also reports dry skin and eczema on her hands, for which she uses Aquaphor and Jurgens lotion.  She recently removed a large amount of dry wax from her ear, which improved her hearing. She continues to experience dry skin in her ears, which she manages by letting water run into her ears during showers.  She is currently on multiple medications including omeprazole  for acid reflux, lubricating eye drops for both eyes, Robaxin  500 mg as needed, Tylenol  as needed, and several others for various conditions including COPD, low blood pressure, anxiety, and pain management. She also uses a prosthetic eye and reports a recent incident where she almost scratched it while applying cream.  She has a history of chronic kidney disease stage 3, but reports no recent changes or concerns from her kidney specialist. She also mentions a past issue with diabetes, but her recent A1c was 5.3, indicating she is not currently diabetic.  Her bowel movements are regular, but she experiences bad breath despite good oral hygiene, which she suspects may be related to her gut health. She is not currently taking a probiotic.   Past Medical History:  Diagnosis Date   Acute pansinusitis 08/02/2017   Anxiety    Arthritis    ASD (atrial septal defect)    s/p closure with Amplatzer device 10/05/04 (Dr. DOROTHA Franky Minerva, [facility]) 10/05/04   Cancer (HCC)  Cataract    Chronic pain    CKD (chronic kidney disease)    Dyspnea    Fatty liver 09/04/2019   GERD (gastroesophageal reflux disease)    Heart murmur    no longer heard   History of hiatal hernia    Hyperlipidemia    Legally blind in right eye, as defined in [place]     Lumbar herniated disc    Migraines    On home oxygen  therapy 06/15/2022   Pt was given  O2 on D/C from [facility] 04/2022   OSA on CPAP 06/15/2022   PONV (postoperative nausea and vomiting)    Sciatica    Past Surgical History:  Procedure Laterality Date   ABLATION     BREAST LUMPECTOMY WITH RADIOACTIVE SEED AND SENTINEL LYMPH NODE BIOPSY Right 11/23/2020   Procedure: RIGHT BREAST LUMPECTOMY WITH RADIOACTIVE SEED AND RIGHT AXILLARY SENTINEL LYMPH NODE BIOPSY;  Surgeon: Ebbie Cough, MD;  Location: [facility] OR;  Service: General;  Laterality: Right;   BREAST SURGERY Bilateral 2011   Breast Reduction Surgery   BUNIONECTOMY     CARDIAC CATHETERIZATION     10/05/04 [facility]): LM < 25%, otherwise normal coronaries. No pulmonary HTN, Mildly enlarged RV. Secundum ASD s/p closure.   CARDIAC SURGERY     CATARACT EXTRACTION     CLEFT PALATE REPAIR     s/p cleft lip and palate repair   EYE SURGERY Right 2019   right eye removed   IR IMAGING GUIDED PORT INSERTION  12/06/2023   LUMBAR LAMINECTOMY/DECOMPRESSION MICRODISCECTOMY Left 05/23/2016   Procedure: LEFT L4-L5 LATERAL RECESS DECOMPRESSION WITH CENTRAL AND RIGHT DECOMPRESSION VIA LEFT SIDE;  Surgeon: Lynwood FORBES Better, MD;  Location: [facility] OR;  Service: Orthopedics;  Laterality: Left;   LUMBAR LAMINECTOMY/DECOMPRESSION MICRODISCECTOMY Left 05/23/2016   Procedure: LUMBAR LAMINECTOMY/DECOMPRESSION MICRODISCECTOMY Lumbar five - Sacral One 1 LEVEL;  Surgeon: Lynwood FORBES Better, MD;  Location: [facility] OR;  Service: Orthopedics;  Laterality: Left;   REDUCTION MAMMAPLASTY  2011   SHOULDER INJECTION Left 05/23/2016   Procedure: SHOULDER INJECTION;  Surgeon: Lynwood FORBES Better, MD;  Location: [facility] OR;  Service: Orthopedics;  Laterality: Left;  band-aid per pa-c   TRANSTHORACIC ECHOCARDIOGRAM     12/15/05 [facility]): Mild LVH, EF > 55%, grade 1 diastolic dysfunction, Trivial MR/PR/TR.   TUBAL LIGATION      Allergies  Allergen Reactions   Zithromax [Azithromycin] Shortness Of Breath, Itching and Other (See Comments)    TOTAL BODY ITCHING [EVEN SOLES OF FEET] WHEEZING     Tramadol  Itching and Other (See Comments)    Has taken recently without any side effects.   Psyllium Nausea And Vomiting and Other (See Comments)    Metamucil. Sneezing      Allergies as of 03/14/2024       Reactions   Zithromax [azithromycin] Shortness Of Breath, Itching, Other (See Comments)   TOTAL BODY ITCHING [EVEN SOLES OF FEET] WHEEZING   Tramadol  Itching, Other (See Comments)   Has taken recently without any side effects.   Psyllium Nausea And Vomiting, Other (See Comments)   Metamucil. Sneezing         Medication List        Accurate as of March 14, 2024  7:51 PM. If you have any questions, ask your nurse or doctor.          acetaminophen  500 MG tablet Commonly known as: TYLENOL  Take 500 mg by mouth daily as needed for moderate pain.   albuterol  108 (90 Base) MCG/ACT inhaler  Commonly known as: VENTOLIN  HFA Inhale 2 puffs into the lungs every 6 (six) hours as needed for wheezing or shortness of breath.   ALPRAZolam  1 MG tablet Commonly known as: XANAX  Take 1 tablet (1 mg total) by mouth at bedtime.   amitriptyline  75 MG tablet Commonly known as: ELAVIL  TAKE 1 TABLET BY MOUTH AT  BEDTIME   Belbuca  300 MCG Film Generic drug: Buprenorphine  HCl Place 1 Film inside cheek 2 (two) times daily as needed. Must last 30 days.   cyanocobalamin  500 MCG tablet Commonly known as: VITAMIN B12 Take 1 tablet (500 mcg total) by mouth daily.   fluticasone -salmeterol 250-50 MCG/ACT Aepb Commonly known as: ADVAIR Inhale 1 puff into the lungs in the morning and at bedtime.   HYDROcodone -acetaminophen  5-325 MG tablet Commonly known as: NORCO/VICODIN Take 1 tablet by mouth 2 (two) times daily as needed for severe pain (pain score 7-10). Must last 30 days. Start taking on: April 03, 2024   ketoconazole 2 % cream Commonly known as: NIZORAL Apply 1 Application topically 2 (two) times daily.   lidocaine -prilocaine  cream Commonly known as: EMLA  Apply topically  daily.   loperamide  2 MG capsule Commonly known as: IMODIUM  Take 1-2 capsules (2-4 mg total) by mouth 4 (four) times daily as needed for diarrhea or loose stools.   LUBRICATING EYE DROPS OP Place 1 drop into the right eye in the morning, at noon, and at bedtime.   magnesium  30 MG tablet Take 30 mg by mouth 2 (two) times daily.   methocarbamol  500 MG tablet Commonly known as: ROBAXIN  Take 1 tablet (500 mg total) by mouth every 6 (six) hours as needed for muscle spasms.   midodrine  10 MG tablet Commonly known as: PROAMATINE  TAKE 1 TABLET BY MOUTH IN THE  MORNING AND AT BEDTIME   multivitamin with minerals Tabs tablet Take 1 tablet by mouth daily.   neomycin-polymyxin b -dexamethasone  3.5-10000-0.1 Oint Commonly known as: MAXITROL   omeprazole  20 MG tablet Commonly known as: PRILOSEC  OTC Take 20 mg by mouth daily.   PRESCRIPTION MEDICATION CPAP- At bedtime   Repatha  SureClick 140 MG/ML Soaj Generic drug: Evolocumab  Inject 140 mg into the skin every 14 (fourteen) days.   triamcinolone  cream 0.1 % Commonly known as: KENALOG  Apply topically 2 (two) times daily. What changed: how much to take Changed by: Ahmadou Bolz C Markis Langland   Verzenio  100 MG tablet Generic drug: abemaciclib  TAKE 1 TABLET BY MOUTH TWICE DAILY   Vitamin D  (Ergocalciferol ) 1.25 MG (50000 UNIT) Caps capsule Commonly known as: DRISDOL  TAKE 1 CAPSULE BY MOUTH EVERY 7  DAYS ( MONDAY )        Review of Systems  Constitutional:  Negative for appetite change, chills, fatigue, fever and unexpected weight change.  HENT:  Negative for congestion, ear discharge, ear pain, hearing loss, nosebleeds, postnasal drip, rhinorrhea, sinus pressure, sinus pain, sneezing, sore throat, tinnitus and trouble swallowing.   Eyes:  Negative for pain, discharge, redness, itching and visual disturbance.  Respiratory:  Negative for cough, chest tightness, shortness of breath and wheezing.   Cardiovascular:  Negative for chest pain,  palpitations and leg swelling.  Gastrointestinal:  Negative for abdominal distention, abdominal pain, blood in stool, constipation, diarrhea, nausea and vomiting.  Endocrine: Negative for cold intolerance, heat intolerance, polydipsia, polyphagia and polyuria.  Genitourinary:  Negative for difficulty urinating, dysuria, flank pain, frequency and urgency.  Musculoskeletal:  Positive for arthralgias and back pain. Negative for gait problem, joint swelling, myalgias, neck pain and neck stiffness.  Skin:  Negative for color change, pallor and rash.       Palm dry skin   Neurological:  Negative for dizziness, syncope, speech difficulty, weakness, light-headedness, numbness and headaches.  Hematological:  Does not bruise/bleed easily.  Psychiatric/Behavioral:  Negative for agitation, behavioral problems, confusion, hallucinations, self-injury, sleep disturbance and suicidal ideas. The patient is not nervous/anxious.     Immunization History  Administered Date(s) Administered   Hepatitis A 03/02/2023, 12/28/2023   Hepatitis B 03/02/2023, 12/28/2023   Influenza Inj Mdck Quad Pf 04/25/2019   Influenza, Seasonal, Injecte, Preservative Fre 03/14/2024   Influenza,inj,Quad PF,6+ Mos 02/24/2020, 04/15/2022, 02/21/2023   Influenza,inj,quad, With Preservative 04/25/2019   Influenza-Unspecified 05/26/2021   Moderna Sars-Covid-2 Vaccination 06/29/2019, 08/03/2019, 06/24/2020   PFIZER(Purple Top)SARS-COV-2 Vaccination 05/26/2021   Pneumococcal Conjugate-13 05/29/2019   Pneumococcal Polysaccharide-23 07/29/2019   Tdap 02/21/2023   Unspecified SARS-COV-2 Vaccination 02/21/2023   Zoster Recombinant(Shingrix) 02/17/2020, 04/20/2020   Pertinent  Health Maintenance Due  Topic Date Due   FOOT EXAM  Never done   HEMOGLOBIN A1C  01/24/2023   OPHTHALMOLOGY EXAM  08/24/2024   Mammogram  11/28/2024   Colonoscopy  08/16/2027   Influenza Vaccine  Completed      10/20/2023   11:07 AM 12/20/2023    8:12 AM  01/15/2024   10:43 AM 03/13/2024    8:24 AM 03/14/2024    8:41 AM  Fall Risk  Falls in the past year? 1 1 1  0 0  Was there an injury with Fall? 1 0 0 0 0  Fall Risk Category Calculator 3 1 1  0 0  Patient at Risk for Falls Due to Impaired balance/gait;Impaired vision    No Fall Risks  Fall risk Follow up Falls prevention discussed    Falls evaluation completed   Functional Status Survey:    Vitals:   03/14/24 0844  BP: 138/80  Pulse: 86  Resp: 19  Temp: 97.8 F (36.6 C)  SpO2: 98%  Weight: 209 lb 6.4 oz (95 kg)  Height: 5' 7 (1.702 m)   Body mass index is 32.8 kg/m. Physical Exam GENERAL: Alert, cooperative, well developed, no acute distress. HEENT: Normocephalic, Left eye Prosthesis.normal oropharynx, moist mucous membranes, eardrums normal, nose normal. CHEST: Clear to auscultation bilaterally, no wheezes, rhonchi, or crackles. CARDIOVASCULAR: Normal heart rate and rhythm, S1 and S2 normal without murmurs. ABDOMEN: Soft, non-tender, non-distended, without organomegaly, normal bowel sounds. EXTREMITIES: No cyanosis or edema, left ankle tender and swollen. NEUROLOGICAL: Cranial nerves grossly intact, moves all extremities without gross motor or sensory deficit.  SKIN: No rash,no lesion or erythema  Left thumb small dry chipped nail trimmed with sterile scissor no bleeding noted. PSYCHIATRY/BEHAVIORAL: Mood stable   Labs reviewed: Recent Labs    09/13/23 1012 09/25/23 1333 12/27/23 1207 01/24/24 1406 02/21/24 1402  NA 143   < > 141 141 140  K 3.9   < > 3.8 4.3 4.0  CL 102   < > 105 105 103  CO2 29   < > 31 32 33*  GLUCOSE 92   < > 97 94 122*  BUN 11   < > 15 13 12   CREATININE 1.05*   < > 1.29* 1.11* 1.27*  CALCIUM  9.0   < > 9.4 9.2 9.0  MG 1.1*  --   --   --   --    < > = values in this interval not displayed.   Recent Labs    12/27/23 1207 01/24/24 1406 02/21/24 1402  AST 78*  61* 69*  ALT 57* 68* 62*  ALKPHOS 212* 250* 266*  BILITOT 0.6 0.8 0.6  PROT 7.0  7.1 7.0  ALBUMIN  3.6 3.8 3.6   Recent Labs    12/27/23 1207 01/24/24 1406 02/21/24 1402  WBC 4.6 4.6 3.4*  NEUTROABS 2.4 1.7 1.2*  HGB 10.9* 12.2 11.3*  HCT 31.9* 35.8* 33.7*  MCV 104.6* 105.3* 106.3*  PLT 185 166 174   Lab Results  Component Value Date   TSH 1.26 09/13/2023   Lab Results  Component Value Date   HGBA1C 5.3 07/26/2022   Lab Results  Component Value Date   CHOL 304 (H) 09/13/2023   HDL 83 09/13/2023   LDLCALC 169 (H) 09/13/2023   TRIG 290 (H) 09/13/2023   CHOLHDL 3.7 09/13/2023    Significant Diagnostic Results in last 30 days:  No results found.  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 Cortisone injection ineffective. Swelling more pronounced on the left side with intermittent episodes of increased pain and swelling. - Advise alternating between soft and hard ankle braces for support - Recommend elevation of the ankle when sitting - Apply ice compress to reduce swelling  Chronic obstructive pulmonary disease (COPD) - Breathing stable  COPD managed with Advair and albuterol  inhaler.  Chronic kidney disease, stage 3 - Chronic kidney disease stage 3, stable with no new symptoms or concerns. - continue to avoid Nephrotoxins and dose all other medication for renal clearance   Mixed hyperlipidemia Mixed hyperlipidemia. Cholesterol levels need to be checked as she has not been done recently. - Order cholesterol panel  Essential hypertension Essential hypertension, well-controlled with medication. Blood pressure recorded at 138/80 mmHg.  Generalized anxiety disorder Generalized anxiety disorder managed with alprazolam . Reports withdrawal symptoms if dose is missed. - continue to monitor   Depression Depression stable with no current symptoms. Managed with amitriptyline .  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 Current treatment includes topical creams and moisturizers. - Prescribe triamcinolone  cream - Prescribe ketoconazole cream - Send prescriptions to [facility]  Dry skin of external ear canals Dry skin in external ear canals with no wax buildup. Previous dry wax removal improved hearing. Persistent dryness noted. - Advise letting water run into ears during showering to help with dryness  Nail dystrophy of right thumb Nail dystrophy of the right thumb with recurrent peeling and brittleness.Nail trim with sterile scissor.no bleeding. Likely related to medication side effects. - Advise using an emery board to file the nail when wet - Avoid lifting the nail to prevent further damage   Family/ staff Communication: Reviewed plan of care with patient verbalized understanding   Labs/tests ordered:  - Urine microalbumin creatinine ratio  - TSH - Hgb A1C - Lipid panel  Next Appointment : Return in about 6 months (around 09/12/2024) for medical mangement of chronic issues.Brandy [name]   Spent 30 minutes of Face to face and non-face to face with patient  >50% time spent counseling; reviewing medical record; tests; labs; documentation and developing future plan of care.   Roxan JAYSON Plough, NP

## 2024-03-15 LAB — LIPID PANEL
Cholesterol: 311 mg/dL — ABNORMAL HIGH (ref <200)
HDL: 93 mg/dL (ref >=50)
LDL Cholesterol (Calc): 171 mg/dL — ABNORMAL HIGH
Non-HDL Cholesterol (Calc): 218 mg/dL — ABNORMAL HIGH (ref <130)
Total CHOL/HDL Ratio: 3.3 (calc) (ref <5.0)
Triglycerides: 305 mg/dL — ABNORMAL HIGH (ref <150)

## 2024-03-15 LAB — MICROALBUMIN / CREATININE URINE RATIO
Creatinine, Urine: 151 mg/dL (ref 20–275)
Microalb, Ur: <0.2 mg/dL

## 2024-03-15 LAB — TSH: TSH: 3.5 m[IU]/L (ref 0.40–4.50)

## 2024-03-15 LAB — HEMOGLOBIN A1C
Hgb A1c MFr Bld: 5.9 % — ABNORMAL HIGH (ref <5.7)
Mean Plasma Glucose: 123 mg/dL
eAG (mmol/L): 6.8 mmol/L

## 2024-03-19 ENCOUNTER — Other Ambulatory Visit: Payer: Self-pay

## 2024-03-19 DIAGNOSIS — C50919 Malignant neoplasm of unspecified site of unspecified female breast: Secondary | ICD-10-CM

## 2024-03-20 ENCOUNTER — Inpatient Hospital Stay: Payer: Medicare Other

## 2024-03-20 ENCOUNTER — Inpatient Hospital Stay: Attending: Hematology

## 2024-03-20 VITALS — BP 132/90 | HR 98 | Temp 98.9°F | Resp 17

## 2024-03-20 DIAGNOSIS — Z170 Estrogen receptor positive status [ER+]: Secondary | ICD-10-CM | POA: Diagnosis not present

## 2024-03-20 DIAGNOSIS — Z1721 Progesterone receptor positive status: Secondary | ICD-10-CM | POA: Insufficient documentation

## 2024-03-20 DIAGNOSIS — Z1732 Human epidermal growth factor receptor 2 negative status: Secondary | ICD-10-CM | POA: Diagnosis not present

## 2024-03-20 DIAGNOSIS — C50511 Malignant neoplasm of lower-outer quadrant of right female breast: Secondary | ICD-10-CM | POA: Insufficient documentation

## 2024-03-20 DIAGNOSIS — Z5111 Encounter for antineoplastic chemotherapy: Secondary | ICD-10-CM | POA: Diagnosis present

## 2024-03-20 DIAGNOSIS — C7800 Secondary malignant neoplasm of unspecified lung: Secondary | ICD-10-CM | POA: Diagnosis present

## 2024-03-20 DIAGNOSIS — C50919 Malignant neoplasm of unspecified site of unspecified female breast: Secondary | ICD-10-CM

## 2024-03-20 LAB — CMP (CANCER CENTER ONLY)
ALT: 27 U/L (ref 0–44)
AST: 37 U/L (ref 15–41)
Albumin: 3.6 g/dL (ref 3.5–5.0)
Alkaline Phosphatase: 200 U/L — ABNORMAL HIGH (ref 38–126)
Anion gap: 6 (ref 5–15)
BUN: 8 mg/dL (ref 6–20)
CO2: 31 mmol/L (ref 22–32)
Calcium: 9.5 mg/dL (ref 8.9–10.3)
Chloride: 104 mmol/L (ref 98–111)
Creatinine: 1.2 mg/dL — ABNORMAL HIGH (ref 0.44–1.00)
GFR, Estimated: 52 mL/min — ABNORMAL LOW (ref >60)
Glucose, Bld: 104 mg/dL — ABNORMAL HIGH (ref 70–99)
Potassium: 3.5 mmol/L (ref 3.5–5.1)
Sodium: 141 mmol/L (ref 135–145)
Total Bilirubin: 0.7 mg/dL (ref 0.0–1.2)
Total Protein: 7 g/dL (ref 6.5–8.1)

## 2024-03-20 LAB — CBC WITH DIFFERENTIAL (CANCER CENTER ONLY)
Abs Immature Granulocytes: 0.01 K/uL (ref 0.00–0.07)
Basophils Absolute: 0 K/uL (ref 0.0–0.1)
Basophils Relative: 1 %
Eosinophils Absolute: 0.1 K/uL (ref 0.0–0.5)
Eosinophils Relative: 1 %
HCT: 35.1 % — ABNORMAL LOW (ref 36.0–46.0)
Hemoglobin: 11.9 g/dL — ABNORMAL LOW (ref 12.0–15.0)
Immature Granulocytes: 0 %
Lymphocytes Relative: 51 %
Lymphs Abs: 2.4 K/uL (ref 0.7–4.0)
MCH: 35.1 pg — ABNORMAL HIGH (ref 26.0–34.0)
MCHC: 33.9 g/dL (ref 30.0–36.0)
MCV: 103.5 fL — ABNORMAL HIGH (ref 80.0–100.0)
Monocytes Absolute: 0.3 K/uL (ref 0.1–1.0)
Monocytes Relative: 6 %
Neutro Abs: 2 K/uL (ref 1.7–7.7)
Neutrophils Relative %: 41 %
Platelet Count: 192 K/uL (ref 150–400)
RBC: 3.39 MIL/uL — ABNORMAL LOW (ref 3.87–5.11)
RDW: 14.1 % (ref 11.5–15.5)
WBC Count: 4.8 K/uL (ref 4.0–10.5)
nRBC: 0 % (ref 0.0–0.2)

## 2024-03-20 MED ORDER — DIPHENHYDRAMINE HCL 25 MG PO CAPS: 25.0000 mg | ORAL_CAPSULE | Freq: Once | ORAL | Status: DC

## 2024-03-20 MED ORDER — FULVESTRANT 250 MG/5ML IM SOSY
500.0000 mg | PREFILLED_SYRINGE | Freq: Once | INTRAMUSCULAR | Status: AC
  Administered 2024-03-20: 500 mg via INTRAMUSCULAR
  Filled 2024-03-20: qty 10

## 2024-03-20 MED ORDER — FAMOTIDINE 20 MG PO TABS: 20.0000 mg | ORAL_TABLET | Freq: Once | ORAL | Status: DC

## 2024-03-20 MED ORDER — IBUPROFEN 200 MG PO TABS: 400.0000 mg | ORAL_TABLET | Freq: Once | ORAL | Status: DC

## 2024-03-21 LAB — CANCER ANTIGEN 15-3: CA 15-3: 58 U/mL — ABNORMAL HIGH (ref 0.0–25.0)

## 2024-03-21 LAB — CANCER ANTIGEN 27.29: CA 27.29: 82.5 U/mL — ABNORMAL HIGH (ref 0.0–38.6)

## 2024-03-22 ENCOUNTER — Ambulatory Visit: Payer: Self-pay | Admitting: Family

## 2024-03-22 DIAGNOSIS — G4733 Obstructive sleep apnea (adult) (pediatric): Secondary | ICD-10-CM | POA: Diagnosis not present

## 2024-03-24 ENCOUNTER — Other Ambulatory Visit: Payer: Self-pay | Admitting: Family

## 2024-03-24 DIAGNOSIS — G4733 Obstructive sleep apnea (adult) (pediatric): Secondary | ICD-10-CM | POA: Diagnosis not present

## 2024-03-25 NOTE — Telephone Encounter (Signed)
 Pharmacy requested refill.  ?Pended Rx and sent to [facility] for approval.  ?

## 2024-03-26 ENCOUNTER — Other Ambulatory Visit: Payer: Self-pay | Admitting: Hematology

## 2024-03-26 DIAGNOSIS — C50919 Malignant neoplasm of unspecified site of unspecified female breast: Secondary | ICD-10-CM

## 2024-04-08 ENCOUNTER — Encounter: Payer: Self-pay | Admitting: Radiology

## 2024-04-11 ENCOUNTER — Telehealth: Payer: Self-pay

## 2024-04-11 ENCOUNTER — Other Ambulatory Visit (HOSPITAL_COMMUNITY): Payer: Self-pay

## 2024-04-11 NOTE — Telephone Encounter (Signed)
 Oral Oncology Patient Advocate Encounter   Received notification that prior authorization for Verzenio  100mg  is required.   PA submitted on 04/11/24 Key [id number] Status is pending     Lucie Lamer, CPhT [place]  [facility] Oncology Pharmacy Patient Advocate Specialist [facility] [place] Phone: [phone]  Fax: [phone] [email or web address]

## 2024-04-11 NOTE — Telephone Encounter (Signed)
 Oral Oncology Patient Advocate Encounter  Prior Authorization for Verzenio  has been approved.    PA# [id number] Effective dates: 04/11/24 through 06/05/25   Lucie Lamer, CPhT [place]  [facility] Oncology Pharmacy Patient Advocate Specialist [facility] [place] Phone: [phone]  Fax: [phone] [email or web address]

## 2024-04-12 ENCOUNTER — Telehealth: Payer: Self-pay

## 2024-04-12 NOTE — Telephone Encounter (Signed)
 Oral Oncology Patient Advocate Encounter   Received notification that patient is due for re-enrollment for assistance for Verzenio  through [facility].   Re-enrollment process has been initiated and will be submitted upon completion of necessary documents.  [employer] number [phone].   I will continue to follow until final determination.  Lucie Lamer, CPhT [place]  [facility] Special[facility] Pharm[facility] Services Oncology Pharmacy Patient Advocate Specialist [facility] [place] Phone: [phone]  Fax: [phone] [email or web address]

## 2024-04-15 ENCOUNTER — Other Ambulatory Visit: Payer: Self-pay

## 2024-04-15 DIAGNOSIS — C50919 Malignant neoplasm of unspecified site of unspecified female breast: Secondary | ICD-10-CM

## 2024-04-15 DIAGNOSIS — E876 Hypokalemia: Secondary | ICD-10-CM

## 2024-04-16 ENCOUNTER — Ambulatory Visit: Admitting: Podiatry

## 2024-04-16 DIAGNOSIS — L853 Xerosis cutis: Secondary | ICD-10-CM

## 2024-04-16 DIAGNOSIS — Z0189 Encounter for other specified special examinations: Secondary | ICD-10-CM

## 2024-04-16 DIAGNOSIS — L851 Acquired keratosis [keratoderma] palmaris et plantaris: Secondary | ICD-10-CM

## 2024-04-16 DIAGNOSIS — B351 Tinea unguium: Secondary | ICD-10-CM

## 2024-04-16 DIAGNOSIS — E1142 Type 2 diabetes mellitus with diabetic polyneuropathy: Secondary | ICD-10-CM | POA: Diagnosis not present

## 2024-04-16 DIAGNOSIS — E119 Type 2 diabetes mellitus without complications: Secondary | ICD-10-CM

## 2024-04-16 DIAGNOSIS — M79675 Pain in left toe(s): Secondary | ICD-10-CM | POA: Diagnosis not present

## 2024-04-16 DIAGNOSIS — M79674 Pain in right toe(s): Secondary | ICD-10-CM

## 2024-04-17 ENCOUNTER — Telehealth: Payer: Self-pay | Admitting: Hematology

## 2024-04-17 ENCOUNTER — Inpatient Hospital Stay

## 2024-04-17 ENCOUNTER — Inpatient Hospital Stay: Admitting: Hematology

## 2024-04-17 ENCOUNTER — Telehealth: Payer: Self-pay

## 2024-04-17 NOTE — Telephone Encounter (Signed)
 Copied from CRM [id number]. Topic: Clinical - Medication Prior Auth >> Apr 17, 2024  1:20 PM DeAngela L wrote: Reason for CRM: the patient is calling to ask if the office could send a Prior Auth to [facility] patient states she misplaced her prosthetic eye   Pt num [phone] (M)  Patient has appointment 04/23/24 at 10am   [facility] Address: [address] [place], [place], [place] [address] Phone: [phone] fax number is [phone]

## 2024-04-17 NOTE — Progress Notes (Signed)
 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 female is here for continued valuation and management of her ER/PR positive HER2 negative breast cancer. She is ambulating with a four-wheeled walker.  She was last seen by me on 01/24/2024 and was doing well.   Today, she says that she is doing well without any new concerns. She is reportedly eating well without any weight loss. Denies new lumps/bumps, change in breathing, bone pains, black/bloody stools, or leg swelling. States she is tolerating her Faslodex  and Verzenio  100 mg twice daily.    Reports that her diarrhea has significantly improved.  She queries about the possibility of stopping her iron replacement. Notes that it seems like her immune system is weakened more than usual, recalling a recent illness she contracted with brain fog, sluggishness, and the feeling of heaviness. At the time she was also experiencing symptoms of withdrawal from her Xanax , which she notes has been a problem with her pharmacy delaying refills. She describes forgetting where she placed her prosthetic eye during this.   Does note some back pain, which she attributes to the way she sleeps.  She says that she plans on staying home for the holidays, noting it will be the first year without her husband.  States that she is planning on updating her influenza vaccine tomorrow.   MEDICAL HISTORY:  Past Medical History:  Diagnosis Date   Acute pansinusitis 08/02/2017   Anxiety    Arthritis    ASD (atrial septal defect)    s/p closure with  Amplatzer device 10/05/04 (Dr. DOROTHA Franky Minerva, [facility]) 10/05/04   [facility])    Cataract    Chronic pain    CKD (chronic kidney disease)    Dyspnea    Fatty liver 09/04/2019   GERD (gastroesophageal reflux disease)    Heart murmur    no longer heard   History of hiatal hernia    Hyperlipidemia    Legally blind in right eye, as defined in [place]     Lumbar herniated disc    Migraines    On home oxygen  therapy 06/15/2022   Pt was given O2 on D/C from [facility] 04/2022   OSA on CPAP 06/15/2022   PONV (postoperative nausea and vomiting)    Sciatica   Uterine Fibroids  SURGICAL HISTORY: Past Surgical History:  Procedure Laterality Date   ABLATION     BREAST LUMPECTOMY WITH RADIOACTIVE SEED AND SENTINEL LYMPH NODE BIOPSY Right 11/23/2020   Procedure: RIGHT BREAST LUMPECTOMY WITH RADIOACTIVE SEED AND RIGHT AXILLARY SENTINEL LYMPH NODE BIOPSY;  Surgeon: Ebbie Cough, MD;  Location: [facility] OR;  Service: General;  Laterality: Right;   BREAST SURGERY Bilateral 2011   Breast Reduction Surgery   BUNIONECTOMY     CARDIAC CATHETERIZATION     10/05/04 [facility]): LM < 25%, otherwise normal coronaries. No pulmonary HTN, Mildly enlarged RV. Secundum ASD s/p closure.   CARDIAC SURGERY     CATARACT EXTRACTION     CLEFT PALATE REPAIR     s/p cleft lip and palate repair   EYE SURGERY Right 2019   right eye removed   IR IMAGING  GUIDED PORT INSERTION  12/06/2023   LUMBAR LAMINECTOMY/DECOMPRESSION MICRODISCECTOMY Left 05/23/2016   Procedure: LEFT L4-L5 LATERAL RECESS DECOMPRESSION WITH CENTRAL AND RIGHT DECOMPRESSION VIA LEFT SIDE;  Surgeon: Lynwood FORBES Better, MD;  Location: [facility] OR;  Service: Orthopedics;  Laterality: Left;   LUMBAR LAMINECTOMY/DECOMPRESSION MICRODISCECTOMY Left 05/23/2016   Procedure: LUMBAR LAMINECTOMY/DECOMPRESSION MICRODISCECTOMY Lumbar five - Sacral One 1 LEVEL;  Surgeon: Lynwood FORBES Better, MD;  Location: [facility] OR;  Service: Orthopedics;  Laterality: Left;   REDUCTION MAMMAPLASTY  2011   SHOULDER  INJECTION Left 05/23/2016   Procedure: SHOULDER INJECTION;  Surgeon: Lynwood FORBES Better, MD;  Location: [facility] OR;  Service: Orthopedics;  Laterality: Left;  band-aid per pa-c   TRANSTHORACIC ECHOCARDIOGRAM     12/15/05 [facility]): Mild LVH, EF > 55%, grade 1 diastolic dysfunction, Trivial MR/PR/TR.   TUBAL LIGATION    Endometrial ablation 2003 Breast Reduction, bilateral 2011  SOCIAL HISTORY: Social History   Socioeconomic History   Marital status: Married    Spouse name: Charles [name]    Number of children: 3   Years of education: 14   Highest education level: Associate degree: occupational, scientist, product/process development, or vocational program  Occupational History   Not on file  Tobacco Use   Smoking status: Former    Current packs/day: 0.00    Average packs/day: 0.3 packs/day for 25.0 years (6.3 ttl pk-yrs)    Types: Cigarettes    Start date: 02/04/1993    Quit date: 02/04/2018    Years since quitting: 6.2   Smokeless tobacco: Never  Vaping Use   Vaping status: Never Used  Substance and Sexual Activity   Alcohol  use: No   Drug use: No   Sexual activity: Not Currently  Other Topics Concern   Not on file  Social History Narrative   Tobacco use, amount per day now: None.   Past tobacco use, amount per day: 1/4   How many years did you use tobacco: Intermittent x 20 years   Alcohol  use (drinks per week): N/A   Diet: Plant Base   Do you drink/eat things with caffeine: Coffee, Tea, Soda.   Marital status:    Married                              What year were you married? 1999   Do you live in a house, apartment, assisted living, condo, trailer, etc.? House    Is it one or more stories? 2   How many persons live in your home? 6 adults, 5 children.   Do you have pets in your home?( please list) 1 Terrier   Highest Level of education completed? AAS   Current or past profession: LPN   Do you exercise?   A little                               Type and how often? Walk, stretches.    Do you have a living  will? No   Do you have a DNR form?   No                                If not, do you want to discuss one?   Do you have signed POA/HPOA forms? No  If so, please bring to you appointment      Do you have any difficulty bathing or dressing yourself? No   Do you have any difficulty preparing food or eating? No   Do you have any difficulty managing your medications? No   Do you have any difficulty managing your finances? No   Do you have any difficulty affording your medications? No.   Social Drivers of Health   Financial Resource Strain: Medium Risk (03/12/2024)   Overall Financial Resource Strain (CARDIA)    Difficulty of Paying Living Expenses: Somewhat hard  Food Insecurity: Food Insecurity Present (03/12/2024)   Hunger Vital Sign    Worried About Running Out of Food in the Last Year: Sometimes true    Ran Out of Food in the Last Year: Not on file  Transportation Needs: Unmet Transportation Needs (03/12/2024)   PRAPARE - Administrator, Civil Service (Medical): Yes    Lack of Transportation (Non-Medical): No  Physical Activity: Insufficiently Active (03/12/2024)   Exercise Vital Sign    Days of Exercise per Week: 2 days    Minutes of Exercise per Session: 20 min  Stress: No Stress Concern Present (03/12/2024)   [employer] of Occupational Health - Occupational Stress Questionnaire    Feeling of Stress: Not at all  Social Connections: Moderately Isolated (03/12/2024)   Social Connection and Isolation Panel    Frequency of Communication with Friends and Family: Once a week    Frequency of Social Gatherings with Friends and Family: Once a week    Attends Religious Services: 1 to 4 times per year    Active Member of [employer] or Organizations: Yes    Attends Banker Meetings: 1 to 4 times per year    Marital Status: Widowed  Intimate Partner Violence: Not At Risk (10/18/2023)   Humiliation, Afraid, Rape, and Kick questionnaire    Fear of  Current or Ex-Partner: No    Emotionally Abused: No    Physically Abused: No    Sexually Abused: No    FAMILY HISTORY: Family History  Problem Relation Age of Onset   Arthritis Mother    Hypertension Mother    Diabetes Mother    High blood pressure Mother    Cancer Father        Lung   Colon cancer Neg Hx    Colon polyps Neg Hx    Esophageal cancer Neg Hx    Rectal cancer Neg Hx    Stomach cancer Neg Hx     ALLERGIES:  is allergic to zithromax [azithromycin], tramadol , and psyllium.  MEDICATIONS:  Current Outpatient Medications  Medication Sig Dispense Refill   acetaminophen  (TYLENOL ) 500 MG tablet Take 500 mg by mouth daily as needed for moderate pain.     albuterol  (VENTOLIN  HFA) 108 (90 Base) MCG/ACT inhaler Inhale 2 puffs into the lungs every 6 (six) hours as needed for wheezing or shortness of breath. 8 g 6   ALPRAZolam  (XANAX ) 1 MG tablet Take 1 tablet (1 mg total) by mouth at bedtime. 30 tablet 0   amitriptyline  (ELAVIL ) 75 MG tablet TAKE 1 TABLET BY MOUTH AT  BEDTIME 30 tablet 11   Buprenorphine  HCl (BELBUCA ) 300 MCG FILM Place 1 Film inside cheek 2 (two) times daily as needed. Must last 30 days. 60 Film 2   Carboxymethylcellul-Glycerin (LUBRICATING EYE DROPS OP) Place 1 drop into the right eye in the morning, at noon, and at bedtime.     cyanocobalamin  (VITAMIN  B12) 500 MCG tablet Take 1 tablet (500 mcg total) by mouth daily. 30 tablet 0   Evolocumab  (REPATHA  SURECLICK) 140 MG/ML SOAJ Inject 140 mg into the skin every 14 (fourteen) days. 6 mL 3   fluticasone -salmeterol (ADVAIR) 250-50 MCG/ACT AEPB Inhale 1 puff into the lungs in the morning and at bedtime. 180 each 3   HYDROcodone -acetaminophen  (NORCO/VICODIN) 5-325 MG tablet Take 1 tablet by mouth 2 (two) times daily as needed for severe pain (pain score 7-10). Must last 30 days. 60 tablet 0   ketoconazole (NIZORAL) 2 % cream Apply 1 Application topically 2 (two) times daily. 15 g 3   lidocaine -prilocaine  (EMLA ) cream  Apply topically daily.     loperamide  (IMODIUM ) 2 MG capsule Take 1-2 capsules (2-4 mg total) by mouth 4 (four) times daily as needed for diarrhea or loose stools. 30 capsule 1   magnesium  30 MG tablet Take 30 mg by mouth 2 (two) times daily.     methocarbamol  (ROBAXIN ) 500 MG tablet Take 1 tablet (500 mg total) by mouth every 6 (six) hours as needed for muscle spasms. 40 tablet 2   midodrine  (PROAMATINE ) 10 MG tablet TAKE 1 TABLET BY MOUTH IN THE  MORNING AND AT BEDTIME 180 tablet 1   Multiple Vitamin (MULTIVITAMIN WITH MINERALS) TABS tablet Take 1 tablet by mouth daily.     neomycin-polymyxin b -dexamethasone  (MAXITROL) 3.5-10000-0.1 OINT      omeprazole  (PRILOSEC  OTC) 20 MG tablet Take 20 mg by mouth daily.     triamcinolone  cream (KENALOG ) 0.1 % Apply topically 2 (two) times daily. 30 g 0   VERZENIO  100 MG tablet TAKE 1 TABLET BY MOUTH TWICE DAILY 56 tablet 0   Vitamin D , Ergocalciferol , (DRISDOL ) 1.25 MG (50000 UNIT) CAPS capsule TAKE 1 CAPSULE BY MOUTH EVERY 7  DAYS ( MONDAY ) 15 capsule 2   No current facility-administered medications for this visit.    REVIEW OF SYSTEMS:    .10 Point review of Systems was done is negative except as noted above.   PHYSICAL EXAMINATION: ECOG FS:1 - Symptomatic but completely ambulatory  Vitals:   04/18/24 0916  BP: 123/67  Pulse: (!) 107  Resp: 17  Temp: 97.7 F (36.5 C)  SpO2: 98%   Wt Readings from Last 3 Encounters:  03/14/24 209 lb 6.4 oz (95 kg)  03/13/24 201 lb (91.2 kg)  02/22/24 205 lb (93 kg)   Body mass index is 32.77 kg/m.   SABRA GENERAL:alert, in no acute distress and comfortable SKIN: no acute rashes, no significant lesions EYES: conjunctiva are pink and non-injected, sclera anicteric OROPHARYNX: MMM, no exudates, no oropharyngeal erythema or ulceration NECK: supple, no JVD LYMPH:  no palpable lymphadenopathy in the cervical, axillary or inguinal regions LUNGS: clear to auscultation b/l with normal respiratory  effort HEART: regular rate & rhythm ABDOMEN:  normoactive bowel sounds , non tender, not distended. Extremity: no pedal edema PSYCH: alert & oriented x 3 with fluent speech NEURO: no focal motor/sensory deficits  LABORATORY DATA:  I have reviewed the data as listed     Latest Ref Rng & Units 04/18/2024    8:41 AM 03/20/2024    1:52 PM 02/21/2024    2:02 PM  CBC EXTENDED  WBC 4.0 - 10.5 K/uL 4.7  4.8  3.4   RBC 3.87 - 5.11 MIL/uL 2.97  3.39  3.17   Hemoglobin 12.0 - 15.0 g/dL 89.4  88.0  88.6   HCT 36.0 - 46.0 % 30.9  35.1  33.7  Platelets 150 - 400 K/uL 167  192  174   NEUT# 1.7 - 7.7 K/uL 1.3  2.0  1.2   Lymph# 0.7 - 4.0 K/uL 2.9  2.4  1.8        Latest Ref Rng & Units 04/18/2024    8:41 AM 03/20/2024    1:52 PM 02/21/2024    2:02 PM  CMP  Glucose 70 - 99 mg/dL 838  895  877   BUN 6 - 20 mg/dL 20  8  12    Creatinine 0.44 - 1.00 mg/dL 8.54  8.79  8.72   Sodium 135 - 145 mmol/L 140  141  140   Potassium 3.5 - 5.1 mmol/L 3.5  3.5  4.0   Chloride 98 - 111 mmol/L 103  104  103   CO2 22 - 32 mmol/L 29  31  33   Calcium  8.9 - 10.3 mg/dL 8.8  9.5  9.0   Total Protein 6.5 - 8.1 g/dL 6.5  7.0  7.0   Total Bilirubin 0.0 - 1.2 mg/dL 0.8  0.7  0.6   Alkaline Phos 38 - 126 U/L 220  200  266   AST 15 - 41 U/L 64  37  69   ALT 0 - 44 U/L 60  27  62      03/20/2024  CA 15-3     0.0 - 25.0 U/mL 58.0 (H)   CA 27.29     0.0 - 38.6 U/mL 82.5 (H)        RADIOGRAPHIC STUDIES: I have personally reviewed the radiological images as listed and agreed with the findings in the report. No results found.  CT CHEST, ABDOMEN, AND PELVIS WITH CONTRAST 01/10/2024  CLINICAL DATA:  Metastatic breast cancer assess treatment response * Tracking Code: BO *   TECHNIQUE: Multidetector CT imaging of the chest, abdomen and pelvis was performed following the standard protocol during bolus administration of intravenous contrast.   RADIATION DOSE REDUCTION: This exam was performed according to  the departmental dose-optimization program which includes automated exposure control, adjustment of the mA and/or kV according to patient size and/or use of iterative reconstruction technique.   CONTRAST:  OMNIPAQUE  IOHEXOL  300 MG/ML  SOLN   COMPARISON:  05/19/2023   FINDINGS: CT CHEST FINDINGS   Cardiovascular: Right chest port catheter. Normal heart size. No pericardial effusion.   Mediastinum/Nodes: No enlarged mediastinal, hilar, or axillary lymph nodes. Thyroid  gland, trachea, and esophagus demonstrate no significant findings.   Lungs/Pleura: Unchanged 0.7 cm nodule of the peripheral right apex (series 6, image 31). Unchanged 0.4 cm nodule in the posterior left apex (series 6, image 29). Background of fine tiny nodules, concentrated in the lung apices. No pleural effusion or pneumothorax.   Musculoskeletal: Status post right lumpectomy and axillary lymph node dissection. No acute osseous findings.   CT ABDOMEN PELVIS FINDINGS   Hepatobiliary: No solid liver abnormality is seen. Tiny gallstones. No gallbladder wall thickening, or biliary dilatation.   Pancreas: Unremarkable. No pancreatic ductal dilatation or surrounding inflammatory changes.   Spleen: Normal in size without significant abnormality.   Adrenals/Urinary Tract: Adrenal glands are unremarkable. Kidneys are normal, without renal calculi, solid lesion, or hydronephrosis. Bladder is unremarkable.   Stomach/Bowel: Stomach is within normal limits. Appendix appears normal. No evidence of bowel wall thickening, distention, or inflammatory changes. Moderate burden of stool throughout the colon.   Vascular/Lymphatic: Aortic atherosclerosis. No enlarged abdominal or pelvic lymph nodes.   Reproductive: No mass or other abnormality.  Other: No abdominal wall hernia or abnormality. No ascites.   Musculoskeletal: No acute osseous findings.   IMPRESSION: 1. Unchanged 0.7 cm nodule of the peripheral  right apex. Unchanged 0.4 cm nodule in the posterior left apex. Background of fine tiny nodules, concentrated in the lung apices. 2. Status post right lumpectomy and axillary lymph node dissection. 3. No evidence of lymphadenopathy or metastatic disease in the abdomen or pelvis. 4. Cholelithiasis.   Aortic Atherosclerosis (ICD10-I70.0).     DIGITAL DIAGNOSTIC BILATERAL MAMMOGRAM WITH TOMOSYNTHESIS AND CAD 11/29/2023  CLINICAL DATA:  58 year old female presents with diffuse intermittent RIGHT breast pain. History of RIGHT breast cancer and lumpectomy in 2022.   TECHNIQUE: Bilateral digital diagnostic mammography and breast tomosynthesis was performed. The images were evaluated with computer-aided detection.   COMPARISON:  Previous exam(s).   ACR Breast Density Category b: There are scattered areas of fibroglandular density.   FINDINGS: Full field views of both breasts and a magnification view of the lumpectomy site demonstrate no suspicious mass, nonsurgical distortion or worrisome calcifications.   RIGHT lumpectomy changes with surgical and biopsy clips in this area again noted. No new changes are present.   IMPRESSION: No new or suspicious findings within either breast. RIGHT lumpectomy changes again noted.   RECOMMENDATION: Per protocol, as the patient is now 2 or more years status post lumpectomy, she may return to annual screening mammography in 1 year. However, given the history of breast cancer, the patient remains eligible for annual diagnostic mammography if preferred. (Code:SM-B-01Y)   Recommend clinical follow-up as indicated. Any further workup should be based on clinical grounds.   I have discussed the findings and recommendations with the patient. If applicable, a reminder letter will be sent to the patient regarding the next appointment.   BI-RADS CATEGORY  2: Benign.       MM DIAG BREAST TOMO BILATERAL  Result Date: 01/04/2022 CLINICAL DATA:   Patient has a history of metastatic right breast cancer diagnosed in October of 2020. Patient is status post right breast lumpectomy in June of 2022. EXAM: DIGITAL DIAGNOSTIC BILATERAL MAMMOGRAM WITH TOMOSYNTHESIS TECHNIQUE: Bilateral digital diagnostic mammography and breast tomosynthesis was performed. COMPARISON:  Previous exam(s). ACR Breast Density Category b: There are scattered areas of fibroglandular density. FINDINGS: Cc and MLO views of bilateral breasts are submitted. Postsurgical changes are identified in the right breast. The left breast is stable. IMPRESSION: Benign findings. RECOMMENDATION: Bilateral diagnostic mammogram in 1 year. I have discussed the findings and recommendations with the patient. If applicable, a reminder letter will be sent to the patient regarding the next appointment. BI-RADS CATEGORY  2: Benign. Electronically Signed   By: Craig Farr M.D.   On: 01/04/2022 16:27     09/02/2020 Surgical Pathology     ASSESSMENT & PLAN:   Brandy [name] [name]  is a 58 y.o. female with:  1. Metastatic breast cancer ER+/PRneg/Her2 neg  02/28/2019 neck CT with results revealing negative for mass or adenopathy in the neck.  03/04/2019 head MRI with results revealing Negative for metastatic disease.  No acute abnormality in the brain.  01/04/2022 Mammogram benign  2. Pulmonary metastases  02/18/2019 chest and abdomen with results revealing 5cm spiculated soft tissue mass in inferior right breast, highly suspicious for primary breast carcinoma. No acute findings or metastatic disease within the abdomen or pelvis. Multiple small pulmonary nodules in both lung bases, consistent with pulmonary metastases. 4.5 cm uterine fibroid.  02/28/2019 C/A/P CT with results revealing Irregular solid 5.0 cm right  breast mass, suspicious for primary right breast malignancy. Innumerable solid pulmonary nodules scattered throughout both lungs, compatible with pulmonary metastases. No  evidence of metastatic disease in the abdomen, pelvis or skeleton. Mildly enlarged and probably myomatous uterus. Simple 1.4 cm left adnexal cyst requires no follow-up. This recommendation follows ACR consensus guidelines: White Paper of the ACR Incidental Findings Committee II on Adnexal Findings. J Am Coll Radiol [phone]. Aortic Atherosclerosis (ICD10-I70.0).  NUCLEAR MEDICINE WHOLE BODY BONE SCAN completed on 03/19/2019 with results revealing 1. No scintigraphic evidence skeletal metastasis. 2. 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   04/09/2019 Bone Density ([id number]) which revealed The BMD measured at Femur Neck Right is 1.153 g/cm2 with a T-score of 0.8. This patient is considered normal according to World Health Organization [facility]) criteria. Lumbar spine was not utilized due to advanced degenerative changes. The scan quality is good. Femur Neck Right 04/09/2019 54.7 Normal 0.8 1.153 g/cm2. Left Forearm Radius 33% 04/09/2019 54.7 Normal 0.8 0.949 g/cm2.  08/04/2019 CT Angio Chest ([id number]) revealed 1. No lobar or central pulmonary embolus detected. Exam is limited secondary to respiratory motion. 2. Mild ground-glass attenuation may represent mild pneumonitis or areas of air trapping. 3. Signs of atrial septal closure. 4. Decrease in size and number of bilateral pulmonary nodules, marked response noted on today's exam with the only nodule remaining near a cm in the right upper lobe and the smaller nodules that were present on the previous examination throughout the chest no longer measurable though the lower lobes are limited by respiratory motion. 5. Decreased size of right breast mass. 6. Probable hepatic steatosis.  S/p rt breast lumpectomy on 6/20 -- no residual carcinoma on pathology.  3. Elevated LFTs 2/2 extensive hepatic steatosis -Under the care of Dr. Shila and last seen on 03/26/2020 -07/03/2019 US  Abd revealed No  acute findings. Normal gallbladder. No bile duct dilation. 2. Significant increased liver parenchymal echogenicity consistent with extensive hepatic steatosis.  PLAN: - Discussed lab results on 04/18/2024 in detail with patient:  CBC showed mild anemia with WBC of 4.7K, Hemoglobin of 10.5 decreased from 11.9, and PLTs of 167K. CMP pending.  - 03/20/2024 CA 15-3 58 and CA 27.29 82.5   These tend to fluctuate. She is reportedly compliant with her Venezio, never missing a dose.   - Reviewed CT CAP 01/23/2024:   1. Unchanged 0.7 cm nodule of the peripheral right apex. Unchanged 0.4 cm nodule in the posterior left apex. Background of fine tiny nodules, concentrated in the lung apices. 2. Status post right lumpectomy and axillary lymph node dissection. 3. No evidence of lymphadenopathy or metastatic disease in the abdomen or pelvis. 4. Cholelithiasis. Aortic Atherosclerosis.  - Reviewed 11/29/2023 Mammogram:  No new or suspicious findings within either breast. RIGHT lumpectomy changes again noted.  BI-RADS CATEGORY  2: Benign.  She can start annual mammograms instead of bi-annual.  - Suggested discussing with PCP about alternatives to Xanax  since she does seem to require this at least daily instead of as needed.  FOLLOW-UP: Plz schedule monthly faslodex  x 12 with portflush and portflush labs  Return to clinic with Dr. Onesimo with port flush and labs in 2 months  The total time spent in the appointment was 30 minutes*.  All of the patient's questions were answered with apparent satisfaction. The patient knows to call the clinic with any problems, questions or concerns.   Emaline Onesimo MD MS AAHIVMS [facility] Hematology/Oncology Physician [facility]  .*Total Encounter Time as defined by the Centers for Medicare and Medicaid Services includes, in addition to the face-to-face time of a patient visit (documented in the note above) non-face-to-face time: obtaining and reviewing outside  history, ordering and reviewing medications, tests or procedures, care coordination (communications with other health care professionals or caregivers) and documentation in the medical record.  I,  Damien Lagle,acting as a scribe for Emaline Saran, MD.,have documented all relevant documentation on the behalf of Emaline Saran, MD,as directed by  Emaline Saran, MD while in the presence of Emaline Saran, MD.  I have reviewed the above documentation for accuracy and completeness, and I agree with the above. Emaline Candida Saran MD.

## 2024-04-17 NOTE — Telephone Encounter (Signed)
 Spoke with patient to  clarify if she is under the care of an eye doctor, and she states she has not seen an eye doctor in a long time.  Patient called [facility] and they said it is ok for the primary care doctor to do a prior authorization.   Patient has misplaced the right eye prosthetic.  Dinah please advise if you are ok with us  completing prior authorization as it will require you to answer questions regarding the prosthetic

## 2024-04-17 NOTE — Progress Notes (Incomplete)
 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 female is here for continued valuation and management of her ER/PR positive HER2 negative breast cancer.  She was last seen by me on 01/24/2024 and was doing well.    Today, she  MEDICAL HISTORY:  Past Medical History:  Diagnosis Date   Acute pansinusitis 08/02/2017   Anxiety    Arthritis    ASD (atrial septal defect)    s/p closure with Amplatzer device 10/05/04 (Dr. DOROTHA Franky Minerva, [facility]) 10/05/04   Cancer [facility])    Cataract    Chronic pain    CKD (chronic kidney disease)    Dyspnea    Fatty liver 09/04/2019   GERD (gastroesophageal reflux disease)    Heart murmur    no longer heard   History of hiatal hernia    Hyperlipidemia    Legally blind in right eye, as defined in [place]     Lumbar herniated disc    Migraines    On home oxygen  therapy 06/15/2022   Pt was given O2 on D/C from [facility] 04/2022   OSA on CPAP 06/15/2022   PONV (postoperative nausea and vomiting)    Sciatica   Uterine Fibroids  SURGICAL HISTORY: Past Surgical History:  Procedure Laterality Date   ABLATION     BREAST LUMPECTOMY WITH RADIOACTIVE SEED AND SENTINEL LYMPH NODE BIOPSY Right 11/23/2020   Procedure: RIGHT BREAST LUMPECTOMY WITH RADIOACTIVE SEED AND RIGHT AXILLARY SENTINEL LYMPH NODE BIOPSY;  Surgeon: Ebbie Cough, MD;  Location: [facility] OR;  Service: General;  Laterality: Right;   BREAST SURGERY Bilateral 2011   Breast Reduction Surgery   BUNIONECTOMY     CARDIAC CATHETERIZATION     10/05/04 [facility]): LM < 25%, otherwise normal  coronaries. No pulmonary HTN, Mildly enlarged RV. Secundum ASD s/p closure.   CARDIAC SURGERY     CATARACT EXTRACTION     CLEFT PALATE REPAIR     s/p cleft lip and palate repair   EYE SURGERY Right 2019   right eye removed   IR IMAGING GUIDED PORT INSERTION  12/06/2023   LUMBAR LAMINECTOMY/DECOMPRESSION MICRODISCECTOMY Left 05/23/2016   Procedure: LEFT L4-L5 LATERAL RECESS DECOMPRESSION WITH CENTRAL AND RIGHT DECOMPRESSION VIA LEFT SIDE;  Surgeon: Lynwood FORBES Better, MD;  Location: [facility] OR;  Service: Orthopedics;  Laterality: Left;   LUMBAR LAMINECTOMY/DECOMPRESSION MICRODISCECTOMY Left 05/23/2016   Procedure: LUMBAR LAMINECTOMY/DECOMPRESSION MICRODISCECTOMY Lumbar five - Sacral One 1 LEVEL;  Surgeon: Lynwood FORBES Better, MD;  Location: [facility] OR;  Service: Orthopedics;  Laterality: Left;   REDUCTION MAMMAPLASTY  2011   SHOULDER INJECTION Left 05/23/2016   Procedure: SHOULDER INJECTION;  Surgeon: Lynwood FORBES Better, MD;  Location[facility];  Service: Orthopedics;  Laterality: Left;  band-aid per pa-c   TRANSTHORACIC ECHOCARDIOGRAM     12/15/05 [facility]): Mild LVH, EF > 55%, grade 1 diastolic dysfunction, Trivial MR/PR/TR.   TUBAL LIGATION    Endometrial ablation 2003 Breast Reduction, bilateral 2011  SOCIAL HISTORY: Social History   Socioeconomic History   Marital status: Married    Spouse name: Carlin [name]    Number of children: 3   Years of education: 13  Highest education level: Associate degree: occupational, scientist, product/process development, or vocational program  Occupational History   Not on file  Tobacco Use   Smoking status: Former    Current packs/day: 0.00    Average packs/day: 0.3 packs/day for 25.0 years (6.3 ttl pk-yrs)    Types: Cigarettes    Start date: 02/04/1993    Quit date: 02/04/2018    Years since quitting: 6.2   Smokeless tobacco: Never  Vaping Use   Vaping status: Never Used  Substance and Sexual Activity   Alcohol  use: No   Drug use: No   Sexual activity: Not Currently  Other Topics Concern    Not on file  Social History Narrative   Tobacco use, amount per day now: None.   Past tobacco use, amount per day: 1/4   How many years did you use tobacco: Intermittent x 20 years   Alcohol  use (drinks per week): N/A   Diet: Plant Base   Do you drink/eat things with caffeine: Coffee, Tea, Soda.   Marital status:    Married                              What year were you married? 1999   Do you live in a house, apartment, assisted living, condo, trailer, etc.? House    Is it one or more stories? 2   How many persons live in your home? 6 adults, 5 children.   Do you have pets in your home?( please list) 1 Terrier   Highest Level of education completed? AAS   Current or past profession: LPN   Do you exercise?   A little                               Type and how often? Walk, stretches.    Do you have a living will? No   Do you have a DNR form?   No                                If not, do you want to discuss one?   Do you have signed POA/HPOA forms? No                       If so, please bring to you appointment      Do you have any difficulty bathing or dressing yourself? No   Do you have any difficulty preparing food or eating? No   Do you have any difficulty managing your medications? No   Do you have any difficulty managing your finances? No   Do you have any difficulty affording your medications? No.   Social Drivers of Health   Financial Resource Strain: Medium Risk (03/12/2024)   Overall Financial Resource Strain (CARDIA)    Difficulty of Paying Living Expenses: Somewhat hard  Food Insecurity: Food Insecurity Present (03/12/2024)   Hunger Vital Sign    Worried About Running Out of Food in the Last Year: Sometimes true    Ran Out of Food in the Last Year: Not on file  Transportation Needs: Unmet Transportation Needs (03/12/2024)   PRAPARE - Administrator, Civil Service (Medical): Yes    Lack of Transportation (Non-Medical): No  Physical Activity: Insufficiently  Active (03/12/2024)   Exercise Vital Sign  Days of Exercise per Week: 2 days    Minutes of Exercise per Session: 20 min  Stress: No Stress Concern Present (03/12/2024)   [employer] of Occupational Health - Occupational Stress Questionnaire    Feeling of Stress: Not at all  Social Connections: Moderately Isolated (03/12/2024)   Social Connection and Isolation Panel    Frequency of Communication with Friends and Family: Once a week    Frequency of Social Gatherings with Friends and Family: Once a week    Attends Religious Services: 1 to 4 times per year    Active Member of [employer] or Organizations: Yes    Attends Banker Meetings: 1 to 4 times per year    Marital Status: Widowed  Intimate Partner Violence: Not At Risk (10/18/2023)   Humiliation, Afraid, Rape, and Kick questionnaire    Fear of Current or Ex-Partner: No    Emotionally Abused: No    Physically Abused: No    Sexually Abused: No    FAMILY HISTORY: Family History  Problem Relation Age of Onset   Arthritis Mother    Hypertension Mother    Diabetes Mother    High blood pressure Mother    Cancer Father        Lung   Colon cancer Neg Hx    Colon polyps Neg Hx    Esophageal cancer Neg Hx    Rectal cancer Neg Hx    Stomach cancer Neg Hx     ALLERGIES:  is allergic to zithromax [azithromycin], tramadol , and psyllium.  MEDICATIONS:  Current Outpatient Medications  Medication Sig Dispense Refill   acetaminophen  (TYLENOL ) 500 MG tablet Take 500 mg by mouth daily as needed for moderate pain.     albuterol  (VENTOLIN  HFA) 108 (90 Base) MCG/ACT inhaler Inhale 2 puffs into the lungs every 6 (six) hours as needed for wheezing or shortness of breath. 8 g 6   ALPRAZolam  (XANAX ) 1 MG tablet Take 1 tablet (1 mg total) by mouth at bedtime. 30 tablet 0   amitriptyline  (ELAVIL ) 75 MG tablet TAKE 1 TABLET BY MOUTH AT  BEDTIME 30 tablet 11   Buprenorphine  HCl (BELBUCA ) 300 MCG FILM Place 1 Film inside cheek 2 (two)  times daily as needed. Must last 30 days. 60 Film 2   Carboxymethylcellul-Glycerin (LUBRICATING EYE DROPS OP) Place 1 drop into the right eye in the morning, at noon, and at bedtime.     cyanocobalamin  (VITAMIN B12) 500 MCG tablet Take 1 tablet (500 mcg total) by mouth daily. 30 tablet 0   Evolocumab  (REPATHA  SURECLICK) 140 MG/ML SOAJ Inject 140 mg into the skin every 14 (fourteen) days. 6 mL 3   fluticasone -salmeterol (ADVAIR) 250-50 MCG/ACT AEPB Inhale 1 puff into the lungs in the morning and at bedtime. 180 each 3   HYDROcodone -acetaminophen  (NORCO/VICODIN) 5-325 MG tablet Take 1 tablet by mouth 2 (two) times daily as needed for severe pain (pain score 7-10). Must last 30 days. 60 tablet 0   ketoconazole (NIZORAL) 2 % cream Apply 1 Application topically 2 (two) times daily. 15 g 3   lidocaine -prilocaine  (EMLA ) cream Apply topically daily.     loperamide  (IMODIUM ) 2 MG capsule Take 1-2 capsules (2-4 mg total) by mouth 4 (four) times daily as needed for diarrhea or loose stools. 30 capsule 1   magnesium  30 MG tablet Take 30 mg by mouth 2 (two) times daily.     methocarbamol  (ROBAXIN ) 500 MG tablet Take 1 tablet (500 mg total) by mouth every 6 (  six) hours as needed for muscle spasms. 40 tablet 2   midodrine  (PROAMATINE ) 10 MG tablet TAKE 1 TABLET BY MOUTH IN THE  MORNING AND AT BEDTIME 180 tablet 1   Multiple Vitamin (MULTIVITAMIN WITH MINERALS) TABS tablet Take 1 tablet by mouth daily.     neomycin-polymyxin b -dexamethasone  (MAXITROL) 3.5-10000-0.1 OINT      omeprazole  (PRILOSEC  OTC) 20 MG tablet Take 20 mg by mouth daily.     triamcinolone  cream (KENALOG ) 0.1 % Apply topically 2 (two) times daily. 30 g 0   VERZENIO  100 MG tablet TAKE 1 TABLET BY MOUTH TWICE DAILY 56 tablet 0   Vitamin D , Ergocalciferol , (DRISDOL ) 1.25 MG (50000 UNIT) CAPS capsule TAKE 1 CAPSULE BY MOUTH EVERY 7  DAYS ( MONDAY ) 15 capsule 2   No current facility-administered medications for this visit.    REVIEW OF SYSTEMS:     .10 Point review of Systems was done is negative except as noted above.   PHYSICAL EXAMINATION: ECOG FS:1 - Symptomatic but completely ambulatory  There were no vitals filed for this visit.   Wt Readings from Last 3 Encounters:  03/14/24 209 lb 6.4 oz (95 kg)  03/13/24 201 lb (91.2 kg)  02/22/24 205 lb (93 kg)   There is no height or weight on file to calculate BMI.   SABRA GENERAL:alert, in no acute distress and comfortable SKIN: no acute rashes, no significant lesions EYES: conjunctiva are pink and non-injected, sclera anicteric OROPHARYNX: MMM, no exudates, no oropharyngeal erythema or ulceration NECK: supple, no JVD LYMPH:  no palpable lymphadenopathy in the cervical, axillary or inguinal regions LUNGS: clear to auscultation b/l with normal respiratory effort HEART: regular rate & rhythm ABDOMEN:  normoactive bowel sounds , non tender, not distended. Extremity: no pedal edema PSYCH: alert & oriented x 3 with fluent speech NEURO: no focal motor/sensory deficits  LABORATORY DATA:  I have reviewed the data as listed     Latest Ref Rng & Units 03/20/2024    1:52 PM 02/21/2024    2:02 PM 01/24/2024    2:06 PM  CBC  WBC 4.0 - 10.5 K/uL 4.8  3.4  4.6   Hemoglobin 12.0 - 15.0 g/dL 88.0  88.6  87.7   Hematocrit 36.0 - 46.0 % 35.1  33.7  35.8   Platelets 150 - 400 K/uL 192  174  166        Latest Ref Rng & Units 03/20/2024    1:52 PM 02/21/2024    2:02 PM 01/24/2024    2:06 PM  CMP  Glucose 70 - 99 mg/dL 895  877  94   BUN 6 - 20 mg/dL 8  12  13    Creatinine 0.44 - 1.00 mg/dL 8.79  8.72  8.88   Sodium 135 - 145 mmol/L 141  140  141   Potassium 3.5 - 5.1 mmol/L 3.5  4.0  4.3   Chloride 98 - 111 mmol/L 104  103  105   CO2 22 - 32 mmol/L 31  33  32   Calcium  8.9 - 10.3 mg/dL 9.5  9.0  9.2   Total Protein 6.5 - 8.1 g/dL 7.0  7.0  7.1   Total Bilirubin 0.0 - 1.2 mg/dL 0.7  0.6  0.8   Alkaline Phos 38 - 126 U/L 200  266  250   AST 15 - 41 U/L 37  69  61   ALT 0 - 44 U/L  27  62  68  RADIOGRAPHIC STUDIES: I have personally reviewed the radiological images as listed and agreed with the findings in the report. No results found.   MM DIAG BREAST TOMO BILATERAL  Result Date: 01/04/2022 CLINICAL DATA:  Patient has a history of metastatic right breast cancer diagnosed in October of 2020. Patient is status post right breast lumpectomy in June of 2022. EXAM: DIGITAL DIAGNOSTIC BILATERAL MAMMOGRAM WITH TOMOSYNTHESIS TECHNIQUE: Bilateral digital diagnostic mammography and breast tomosynthesis was performed. COMPARISON:  Previous exam(s). ACR Breast Density Category b: There are scattered areas of fibroglandular density. FINDINGS: Cc and MLO views of bilateral breasts are submitted. Postsurgical changes are identified in the right breast. The left breast is stable. IMPRESSION: Benign findings. RECOMMENDATION: Bilateral diagnostic mammogram in 1 year. I have discussed the findings and recommendations with the patient. If applicable, a reminder letter will be sent to the patient regarding the next appointment. BI-RADS CATEGORY  2: Benign. Electronically Signed   By: Craig Farr M.D.   On: 01/04/2022 16:27    09/02/2020 Surgical Pathology     ASSESSMENT & PLAN:   Brandy [name]  is a 58 y.o. female with:  1. Metastatic breast cancer ER+/PRneg/Her2 neg  02/28/2019 neck CT with results revealing negative for mass or adenopathy in the neck.  03/04/2019 head MRI with results revealing Negative for metastatic disease.  No acute abnormality in the brain.  01/04/2022 Mammogram benign  2. Pulmonary metastases  02/18/2019 chest and abdomen with results revealing 5cm spiculated soft tissue mass in inferior right breast, highly suspicious for primary breast carcinoma. No acute findings or metastatic disease within the abdomen or pelvis. Multiple small pulmonary nodules in both lung bases, consistent with pulmonary metastases. 4.5 cm uterine  fibroid.  02/28/2019 C/A/P CT with results revealing Irregular solid 5.0 cm right breast mass, suspicious for primary right breast malignancy. Innumerable solid pulmonary nodules scattered throughout both lungs, compatible with pulmonary metastases. No evidence of metastatic disease in the abdomen, pelvis or skeleton. Mildly enlarged and probably myomatous uterus. Simple 1.4 cm left adnexal cyst requires no follow-up. This recommendation follows ACR consensus guidelines: White Paper of the ACR Incidental Findings Committee II on Adnexal Findings. J Am Coll Radiol [phone]. Aortic Atherosclerosis (ICD10-I70.0).  NUCLEAR MEDICINE WHOLE BODY BONE SCAN completed on 03/19/2019 with results revealing 1. No scintigraphic evidence skeletal metastasis. 2. 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   04/09/2019 Bone Density ([id number]) which revealed The BMD measured at Femur Neck Right is 1.153 g/cm2 with a T-score of 0.8. This patient is considered normal according to World Health Organization [facility]) criteria. Lumbar spine was not utilized due to advanced degenerative changes. The scan quality is good. Femur Neck Right 04/09/2019 54.7 Normal 0.8 1.153 g/cm2. Left Forearm Radius 33% 04/09/2019 54.7 Normal 0.8 0.949 g/cm2.  08/04/2019 CT Angio Chest ([id number]) revealed 1. No lobar or central pulmonary embolus detected. Exam is limited secondary to respiratory motion. 2. Mild ground-glass attenuation may represent mild pneumonitis or areas of air trapping. 3. Signs of atrial septal closure. 4. Decrease in size and number of bilateral pulmonary nodules, marked response noted on today's exam with the only nodule remaining near a cm in the right upper lobe and the smaller nodules that were present on the previous examination throughout the chest no longer measurable though the lower lobes are limited by respiratory motion. 5. Decreased size of right breast mass.  6. Probable hepatic steatosis.  S/p rt breast lumpectomy on 6/20 -- no residual  carcinoma on pathology.  3. Elevated LFTs 2/2 extensive hepatic steatosis -Under the care of Dr. Shila and last seen on 03/26/2020 -07/03/2019 US  Abd revealed No acute findings. Normal gallbladder. No bile duct dilation. 2. Significant increased liver parenchymal echogenicity consistent with extensive hepatic steatosis.  PLAN:  -Discussed lab results done today and 01/24/2024 CBC stable CMP shows chronic elevation of her transaminases due to fatty liver. CT chest abdomen pelvis done on 01/10/2024 shows stable 0.7 cm nodule in the right lung apex and unchanged 0.4 cm nodule in the posterior left apex.  No other evidence of metastatic disease.  No evidence of cancer progression. Continue Verzenio  at 100 mg p.o. twice daily Continue monthly Faslodex  at this time -reasonable to continue mammograms once a year Again reiterated that she should follow-up with her primary care physician to address all metabolic factors involved with her hepatic steatohepatitis.  FOLLOW-UP: Plz schedule monthly faslodex  x 12 with portflush and portflush labs  Return to clinic with Dr. Onesimo with port flush and labs in 3 months  The total time spent in the appointment was *** minutes*.  All of the patient's questions were answered with apparent satisfaction. The patient knows to call the clinic with any problems, questions or concerns.   Emaline Onesimo MD MS AAHIVMS [facility] Hematology/Oncology Physician [facility]  .*Total Encounter Time as defined by the Centers for Medicare and Medicaid Services includes, in addition to the face-to-face time of a patient visit (documented in the note above) non-face-to-face time: obtaining and reviewing outside history, ordering and reviewing medications, tests or procedures, care coordination (communications with other health care professionals or caregivers) and documentation in  the medical record.  I,  Damien Lagle,acting as a scribe for Emaline Onesimo, MD.,have documented all relevant documentation on the behalf of Emaline Onesimo, MD,as directed by  Emaline Onesimo, MD while in the presence of Emaline Onesimo, MD.  I have reviewed the above documentation for accuracy and completeness, and I agree with the above. Emaline Candida Onesimo MD.

## 2024-04-17 NOTE — Telephone Encounter (Signed)
 Please schedule appointment with Ophthalmology last seen per chart on 12/04/2023 at [facility].

## 2024-04-18 ENCOUNTER — Inpatient Hospital Stay: Attending: Hematology | Admitting: Hematology

## 2024-04-18 ENCOUNTER — Inpatient Hospital Stay

## 2024-04-18 VITALS — BP 123/67 | HR 107 | Temp 97.7°F | Resp 17 | Wt 209.2 lb

## 2024-04-18 DIAGNOSIS — Z170 Estrogen receptor positive status [ER+]: Secondary | ICD-10-CM | POA: Diagnosis not present

## 2024-04-18 DIAGNOSIS — Z1721 Progesterone receptor positive status: Secondary | ICD-10-CM | POA: Insufficient documentation

## 2024-04-18 DIAGNOSIS — C50511 Malignant neoplasm of lower-outer quadrant of right female breast: Secondary | ICD-10-CM | POA: Insufficient documentation

## 2024-04-18 DIAGNOSIS — C50919 Malignant neoplasm of unspecified site of unspecified female breast: Secondary | ICD-10-CM

## 2024-04-18 DIAGNOSIS — C7800 Secondary malignant neoplasm of unspecified lung: Secondary | ICD-10-CM | POA: Insufficient documentation

## 2024-04-18 DIAGNOSIS — E876 Hypokalemia: Secondary | ICD-10-CM

## 2024-04-18 DIAGNOSIS — Z1732 Human epidermal growth factor receptor 2 negative status: Secondary | ICD-10-CM | POA: Insufficient documentation

## 2024-04-18 DIAGNOSIS — Z5111 Encounter for antineoplastic chemotherapy: Secondary | ICD-10-CM | POA: Diagnosis present

## 2024-04-18 LAB — CBC WITH DIFFERENTIAL (CANCER CENTER ONLY)
Abs Immature Granulocytes: 0.01 K/uL (ref 0.00–0.07)
Basophils Absolute: 0 K/uL (ref 0.0–0.1)
Basophils Relative: 1 %
Eosinophils Absolute: 0.1 K/uL (ref 0.0–0.5)
Eosinophils Relative: 2 %
HCT: 30.9 % — ABNORMAL LOW (ref 36.0–46.0)
Hemoglobin: 10.5 g/dL — ABNORMAL LOW (ref 12.0–15.0)
Immature Granulocytes: 0 %
Lymphocytes Relative: 61 %
Lymphs Abs: 2.9 K/uL (ref 0.7–4.0)
MCH: 35.4 pg — ABNORMAL HIGH (ref 26.0–34.0)
MCHC: 34 g/dL (ref 30.0–36.0)
MCV: 104 fL — ABNORMAL HIGH (ref 80.0–100.0)
Monocytes Absolute: 0.3 K/uL (ref 0.1–1.0)
Monocytes Relative: 7 %
Neutro Abs: 1.3 K/uL — ABNORMAL LOW (ref 1.7–7.7)
Neutrophils Relative %: 29 %
Platelet Count: 167 K/uL (ref 150–400)
RBC: 2.97 MIL/uL — ABNORMAL LOW (ref 3.87–5.11)
RDW: 14.3 % (ref 11.5–15.5)
WBC Count: 4.7 K/uL (ref 4.0–10.5)
nRBC: 0 % (ref 0.0–0.2)

## 2024-04-18 LAB — CMP (CANCER CENTER ONLY)
ALT: 60 U/L — ABNORMAL HIGH (ref 0–44)
AST: 64 U/L — ABNORMAL HIGH (ref 15–41)
Albumin: 3.5 g/dL (ref 3.5–5.0)
Alkaline Phosphatase: 220 U/L — ABNORMAL HIGH (ref 38–126)
Anion gap: 8 (ref 5–15)
BUN: 20 mg/dL (ref 6–20)
CO2: 29 mmol/L (ref 22–32)
Calcium: 8.8 mg/dL — ABNORMAL LOW (ref 8.9–10.3)
Chloride: 103 mmol/L (ref 98–111)
Creatinine: 1.45 mg/dL — ABNORMAL HIGH (ref 0.44–1.00)
GFR, Estimated: 42 mL/min — ABNORMAL LOW (ref >60)
Glucose, Bld: 161 mg/dL — ABNORMAL HIGH (ref 70–99)
Potassium: 3.5 mmol/L (ref 3.5–5.1)
Sodium: 140 mmol/L (ref 135–145)
Total Bilirubin: 0.8 mg/dL (ref 0.0–1.2)
Total Protein: 6.5 g/dL (ref 6.5–8.1)

## 2024-04-18 MED ORDER — FULVESTRANT 250 MG/5ML IM SOSY
500.0000 mg | PREFILLED_SYRINGE | Freq: Once | INTRAMUSCULAR | Status: AC
  Administered 2024-04-18: 500 mg via INTRAMUSCULAR
  Filled 2024-04-18: qty 10

## 2024-04-18 NOTE — Telephone Encounter (Signed)
 Detailed voicemail left for patient with providers response.

## 2024-04-19 ENCOUNTER — Inpatient Hospital Stay: Payer: Medicare Other

## 2024-04-19 LAB — CANCER ANTIGEN 15-3: CA 15-3: 48.3 U/mL — ABNORMAL HIGH (ref 0.0–25.0)

## 2024-04-19 LAB — CANCER ANTIGEN 27.29: CA 27.29: 66.2 U/mL — ABNORMAL HIGH (ref 0.0–38.6)

## 2024-04-23 ENCOUNTER — Other Ambulatory Visit: Payer: Self-pay | Admitting: Hematology

## 2024-04-23 DIAGNOSIS — C50919 Malignant neoplasm of unspecified site of unspecified female breast: Secondary | ICD-10-CM

## 2024-04-24 ENCOUNTER — Encounter: Payer: Self-pay | Admitting: Hematology

## 2024-04-25 ENCOUNTER — Encounter: Payer: Self-pay | Admitting: Podiatry

## 2024-04-25 NOTE — Progress Notes (Signed)
  Subjective:  Patient ID: Brandy [name] , female    DOB: 1965-09-20,  MRN: [id number]  Brandy [name]  presents to clinic today for for annual diabetic foot examination, at risk foot care with history of diabetic neuropathy, and painful mycotic toenails of both feet that are difficult to trim. Pain interferes with daily activities and wearing enclosed shoe gear comfortably.  Chief Complaint  Patient presents with   Nail Problem    Thick painful toenails, 3 month follow up    New problem(s): None.   PCP is Ngetich, Dinah C, NP. LOV 03/14/2024.  Allergies  Allergen Reactions   Zithromax [Azithromycin] Shortness Of Breath, Itching and Other (See Comments)    TOTAL BODY ITCHING [EVEN SOLES OF FEET] WHEEZING    Tramadol  Itching and Other (See Comments)    Has taken recently without any side effects.   Psyllium Nausea And Vomiting and Other (See Comments)    Metamucil. Sneezing      Review of Systems: Negative except as noted in the HPI.  Objective: No changes noted in today's physical examination. There were no vitals filed for this visit. Brandy [name]  is a pleasant 58 y.o. female in NAD. AAO x 3.   Diabetic foot exam was performed with the following findings:   Normal sensation of 10g monofilament Vascular Examination: CFT <3 seconds b/l. DP pulses faintly palpable b/l. PT pulses  faintly palpable b/l. Digital hair absent. Skin temperature gradient warm to warm b/l. No pain with calf compression. No ischemia or gangrene. No cyanosis or clubbing noted b/l.    Neurological Examination: Sensation grossly intact b/l with 10 gram monofilament. Vibratory sensation intact b/l. Pt has subjective symptoms of neuropathy.  Dermatological Examination: Pedal skin warm and supple b/l. No open wounds b/l. No interdigital macerations. Toenails 1-5 b/l thick, discolored, elongated with subungual debris and pain on dorsal palpation.  Pedal skin noted to  be thick and dry b/l feet.  Musculoskeletal Examination: Muscle strength 5/5 to all lower extremity muscle groups bilaterally. Pes planus deformity noted bilateral LE.  Radiographs: None    Assessment/Plan: 1. Pain due to onychomycosis of toenails of both feet   2. Acquired keratoderma   3. Xerosis cutis   4. Diabetic peripheral neuropathy associated with type 2 diabetes mellitus (HCC)   5. Encounter for diabetic foot exam [facility])     -Patient was evaluated today. All questions/concerns addressed on today's visit. -Patient to continue soft, supportive shoe gear daily. -Toenails 1-5 b/l were debrided in length and girth with sterile nail nippers and dremel without iatrogenic bleeding.  -Continue Revitaderm Cream and Bag Balm Moisturizer on alternate days.. -Patient/POA to call should there be question/concern in the interim.   Return in about 3 months (around 07/17/2024).  Brandy [name], DPM      [place] LOCATION: [address] [place] [address]                   Office [phone]   [facility] LOCATION: [address] [place], [place] [address] Office [phone]

## 2024-04-30 ENCOUNTER — Other Ambulatory Visit: Payer: Self-pay | Admitting: Family

## 2024-04-30 NOTE — Telephone Encounter (Signed)
 Oral Oncology Patient Advocate Encounter  Faxed forms over 04/30/24  Lucie Lamer, CPhT [place]  [facility] Oncology Pharmacy Patient Advocate Specialist II [place] Phone: [phone]  Fax: [phone] [email or web address]

## 2024-05-05 ENCOUNTER — Other Ambulatory Visit: Payer: Self-pay | Admitting: Cardiology

## 2024-05-08 NOTE — Progress Notes (Unsigned)
 PROVIDER NOTE: Interpretation of information contained herein should be left to medically-trained personnel. Specific patient instructions are provided elsewhere under Patient Instructions section of medical record. This document was created in part using AI and STT-dictation technology, any transcriptional errors that may result from this process are unintentional.  Patient: Brandy [name]   Service: E/M   PCP: Leonarda Roxan BROCKS, NP  DOB: 12/15/1965  DOS: 05/09/2024  Provider: Emmy MARLA Blanch, NP  MRN: [id number]  Delivery: Face-to-face  Specialty: Interventional Pain Management  Type: Established Patient  Setting: Ambulatory outpatient facility  Specialty designation: 09  Referring Prov.: Ngetich, Roxan BROCKS, NP  Location: Outpatient office facility       History of present illness (HPI) Brandy [name] , a 58 y.o. year old female, is here today because of her Lumbar facet joint syndrome [M47.816]. Brandy Clarks primary complain today is No chief complaint on file.  Pertinent problems: Brandy [name]  has Spinal stenosis, lumbar region with neurogenic claudication; Left shoulder tendonitis; Post laminectomy syndrome; Low back pain radiating to both legs; Neuropathy; GERD (gastroesophageal reflux disease); CKD (chronic kidney disease), stage III (HCC); Obesity (BMI 30-39.9); Chronic pain syndrome; Encounter for chronic pain management; Disorder of skeletal system; Chronic low back pain (1ry area of Pain) (Bilateral) w/ sciatica (Bilateral); DDD (degenerative disc disease), lumbosacral; Lumbar facet hypertrophy (Multilevel) (Bilateral); Lumbar facet syndrome (Multilevel) (Bilateral) (R>L); Chronic lower extremity pain (2ry area of Pain) (Bilateral) (R>L); Chronic musculoskeletal pain; Chronic hip pain (3ry area of Pain) (Bilateral) (R>L); Spondylosis without myelopathy or radiculopathy, lumbosacral region; Chronic low back pain (Bilateral) w/o sciatica; and  Chronic obstructive pulmonary disease, unspecified COPD type (HCC) on their pertinent problem list.  Pain Assessment: Severity of   is reported as a  /10. Location:    / . Onset:  . Quality:  . Timing:  . Modifying factor(s):  SABRA Vitals:  vitals were not taken for this visit.  BMI: Estimated body mass index is 32.77 kg/m as calculated from the following:   Height as of 03/14/24: 5' 7 (1.702 m).   Weight as of 04/18/24: 209 lb 3.2 oz (94.9 kg).  Last encounter: 03/13/2024. Last procedure: Visit date not found.  Reason for encounter:  *** .   Discussed the use of AI scribe software for clinical note transcription with the patient, who gave verbal consent to proceed.  History of Present Illness           Pharmacotherapy Assessment   Hydrocodone  - acetaminophen  5-325 mg TID for 30 days for break through pain. MME=10 Buprenorphine  HCl (BELBUCA ) 300 mcg Film BID.  Monitoring: [place] PMP: PDMP reviewed during this encounter.       Pharmacotherapy: No side-effects or adverse reactions reported. Compliance: No problems identified. Effectiveness: Clinically acceptable.  No notes on file  UDS:  Summary  Date Value Ref Range Status  09/12/2023 FINAL  Final    Comment:    ==================================================================== [employer] [facility] 13 (MW) ==================================================================== Specimen Alert Not Detected result may be consistent with the time of last use noted for this medication. AS NEEDED (Hydrocodone ) ==================================================================== Test                             Result       Flag       Units  Drug Present and Declared for Prescription Verification   Alpha-hydroxyalprazolam        162  EXPECTED   ng/mg creat    Alpha-hydroxyalprazolam is an expected metabolite of alprazolam .    Source of alprazolam  is a scheduled prescription medication.    Buprenorphine                   27            EXPECTED   ng/mg creat   Norbuprenorphine               47           EXPECTED   ng/mg creat    Source of buprenorphine  is a scheduled prescription medication.    Norbuprenorphine is an expected metabolite of buprenorphine .  Drug Absent but Declared for Prescription Verification   Hydrocodone                     Not Detected UNEXPECTED ng/mg creat ==================================================================== Test                      Result    Flag   Units      Ref Range   Creatinine              78               mg/dL      >=79 ==================================================================== Declared Medications:  The flagging and interpretation on this report are based on the  following declared medications.  Unexpected results may arise from  inaccuracies in the declared medications.   **Note: The testing scope of this panel includes these medications:   Alprazolam  (Xanax )  Hydrocodone  (Norco)   **Note: The testing scope of this panel does not include small to  moderate amounts of these reported medications:   Buprenorphine  (Belbuca )   **Note: The testing scope of this panel does not include the  following reported medications:   Abemaciclib  (Verzenio )  Acetaminophen  (Tylenol )  Acetaminophen  (Norco)  Albuterol  (Ventolin  HFA)  Amitriptyline  (Elavil )  Evolocumab  (Repatha )  Eye Drops  Fluticasone  (Advair)  Ketoconazole  (Nizoral )  Loperamide  (Imodium )  Methocarbamol  (Robaxin )  Midodrine  (Proamatine )  Multivitamin  Omeprazole  (Prilosec )  Salmeterol (Advair)  Triamcinolone  (Kenalog )  Vitamin B12  Vitamin D2 (Drisdol ) ==================================================================== For clinical consultation, please call [phone]. ====================================================================     No results found for: CBDTHCR No results found for: D8THCCBX No results found for: D9THCCBX  ROS  Constitutional: Denies any fever or  chills Gastrointestinal: No reported hemesis, hematochezia, vomiting, or acute GI distress Musculoskeletal: Denies any acute onset joint swelling, redness, loss of ROM, or weakness Neurological: No reported episodes of acute onset apraxia, aphasia, dysarthria, agnosia, amnesia, paralysis, loss of coordination, or loss of consciousness  Medication Review  ALPRAZolam , Buprenorphine  HCl, Evolocumab , Polyethyl Glycol-Propyl Glycol, Vitamin D  (Ergocalciferol ), abemaciclib , acetaminophen , albuterol , amitriptyline , cyanocobalamin , fluticasone -salmeterol, ketoconazole , lidocaine -prilocaine , loperamide , magnesium , methocarbamol , midodrine , multivitamin with minerals, neomycin-polymyxin b -dexamethasone , omeprazole , and triamcinolone  cream  History Review  Allergy: Brandy [name]  is allergic to zithromax [azithromycin], tramadol , and psyllium. Drug: Brandy [name]   reports no history of drug use. Alcohol :  reports no history of alcohol  use. Tobacco:  reports that she quit smoking about 6 years ago. Her smoking use included cigarettes. She started smoking about 31 years ago. She has a 6.3 pack-year smoking history. She has never used smokeless tobacco. Social: Brandy [name]   reports that she quit smoking about 6 years ago. Her smoking use included cigarettes. She started smoking about 31 years ago. She has a 6.3 pack-year smoking history. She has never  used smokeless tobacco. She reports that she does not drink alcohol  and does not use drugs. Medical:  has a past medical history of Acute pansinusitis (08/02/2017), Anxiety, Arthritis, ASD (atrial septal defect), Cancer (HCC), Cataract, Chronic pain, CKD (chronic kidney disease), Dyspnea, Fatty liver (09/04/2019), GERD (gastroesophageal reflux disease), Heart murmur, History of hiatal hernia, Hyperlipidemia, Legally blind in right eye, as defined in [place] , Lumbar herniated disc, Migraines, On home oxygen  therapy (06/15/2022), OSA on CPAP  (06/15/2022), PONV (postoperative nausea and vomiting), and Sciatica. Surgical: Brandy [name]   has a past surgical history that includes Breast surgery (Bilateral, 2011); Cataract extraction; Bunionectomy; Cleft palate repair; Cardiac surgery; Ablation; transthoracic echocardiogram; Cardiac catheterization; Lumbar laminectomy/decompression microdiscectomy (Left, 05/23/2016); Lumbar laminectomy/decompression microdiscectomy (Left, 05/23/2016); Shoulder injection (Left, 05/23/2016); Reduction mammaplasty (2011); Eye surgery (Right, 2019); Tubal ligation; Breast lumpectomy with radioactive seed and sentinel lymph node biopsy (Right, 11/23/2020); and IR IMAGING GUIDED PORT INSERTION (12/06/2023). Family: family history includes Arthritis in her mother; Cancer in her father; Diabetes in her mother; High blood pressure in her mother; Hypertension in her mother.  Laboratory Chemistry Profile   Renal Lab Results  Component Value Date   BUN 20 04/18/2024   CREATININE 1.45 (H) 04/18/2024   LABCREA 151 03/14/2024   BCR 9 10/09/2023   GFRAA >60 02/24/2020   GFRNONAA 42 (L) 04/18/2024    Hepatic Lab Results  Component Value Date   AST 64 (H) 04/18/2024   ALT 60 (H) 04/18/2024   ALBUMIN  3.5 04/18/2024   ALKPHOS 220 (H) 04/18/2024   LIPASE 35 07/12/2021    Electrolytes Lab Results  Component Value Date   NA 140 04/18/2024   K 3.5 04/18/2024   CL 103 04/18/2024   CALCIUM  8.8 (L) 04/18/2024   MG 1.1 (L) 09/13/2023   PHOS 2.6 04/26/2022    Bone Lab Results  Component Value Date   VD25OH 30.51 06/26/2019   VD125OH2TOT 62 05/28/2021   [id number] <8 05/28/2021   VD2125OH2 62 05/28/2021   25OHVITD1 50 11/27/2019   25OHVITD2 46 11/27/2019   25OHVITD3 4.1 11/27/2019    Inflammation (CRP: Acute Phase) (ESR: Chronic Phase) Lab Results  Component Value Date   CRP 5.6 (H) 07/28/2022   ESRSEDRATE 60 (H) 03/23/2023   LATICACIDVEN 1.5 07/23/2022         Note: Above Lab results  reviewed.  Recent Imaging Review  CT CHEST ABDOMEN PELVIS W CONTRAST CLINICAL DATA:  Metastatic breast cancer assess treatment response * Tracking Code: BO *  EXAM: CT CHEST, ABDOMEN, AND PELVIS WITH CONTRAST  TECHNIQUE: Multidetector CT imaging of the chest, abdomen and pelvis was performed following the standard protocol during bolus administration of intravenous contrast.  RADIATION DOSE REDUCTION: This exam was performed according to the departmental dose-optimization program which includes automated exposure control, adjustment of the mA and/or kV according to patient size and/or use of iterative reconstruction technique.  CONTRAST:  OMNIPAQUE  IOHEXOL  300 MG/ML  SOLN  COMPARISON:  05/19/2023  FINDINGS: CT CHEST FINDINGS  Cardiovascular: Right chest port catheter. Normal heart size. No pericardial effusion.  Mediastinum/Nodes: No enlarged mediastinal, hilar, or axillary lymph nodes. Thyroid  gland, trachea, and esophagus demonstrate no significant findings.  Lungs/Pleura: Unchanged 0.7 cm nodule of the peripheral right apex (series 6, image 31). Unchanged 0.4 cm nodule in the posterior left apex (series 6, image 29). Background of fine tiny nodules, concentrated in the lung apices. No pleural effusion or pneumothorax.  Musculoskeletal: Status post right lumpectomy and axillary lymph node dissection. No acute osseous  findings.  CT ABDOMEN PELVIS FINDINGS  Hepatobiliary: No solid liver abnormality is seen. Tiny gallstones. No gallbladder wall thickening, or biliary dilatation.  Pancreas: Unremarkable. No pancreatic ductal dilatation or surrounding inflammatory changes.  Spleen: Normal in size without significant abnormality.  Adrenals/Urinary Tract: Adrenal glands are unremarkable. Kidneys are normal, without renal calculi, solid lesion, or hydronephrosis. Bladder is unremarkable.  Stomach/Bowel: Stomach is within normal limits. Appendix  appears normal. No evidence of bowel wall thickening, distention, or inflammatory changes. Moderate burden of stool throughout the colon.  Vascular/Lymphatic: Aortic atherosclerosis. No enlarged abdominal or pelvic lymph nodes.  Reproductive: No mass or other abnormality.  Other: No abdominal wall hernia or abnormality. No ascites.  Musculoskeletal: No acute osseous findings.  IMPRESSION: 1. Unchanged 0.7 cm nodule of the peripheral right apex. Unchanged 0.4 cm nodule in the posterior left apex. Background of fine tiny nodules, concentrated in the lung apices. 2. Status post right lumpectomy and axillary lymph node dissection. 3. No evidence of lymphadenopathy or metastatic disease in the abdomen or pelvis. 4. Cholelithiasis.  Aortic Atherosclerosis (ICD10-I70.0).  Electronically Signed   By: Marolyn JONETTA Jaksch M.D.   On: 01/23/2024 21:43 Note: Reviewed        Physical Exam  Vitals: There were no vitals taken for this visit. BMI: Estimated body mass index is 32.77 kg/m as calculated from the following:   Height as of 03/14/24: 5' 7 (1.702 m).   Weight as of 04/18/24: 209 lb 3.2 oz (94.9 kg). Ideal: Patient weight not recorded General appearance: Well nourished, well developed, and well hydrated. In no apparent acute distress Mental status: Alert, oriented x 3 (person, place, & time)       Respiratory: No evidence of acute respiratory distress Eyes: PERLA   Assessment   Diagnosis Status  1. Lumbar facet syndrome (Multilevel) (Bilateral) (R>L)   2. Lumbar facet hypertrophy (Multilevel) (Bilateral)   3. Chronic pain syndrome   4. Failed back surgical syndrome   5. Grade 1 Retrolisthesis (L4-5, L5-S1)   6. Medication management    Controlled Controlled Controlled   Updated Problems: No problems updated.  Plan of Care  Problem-specific:  Assessment and Plan            Ms. MIKINZIE MACIEJEWSKI [name]  has a current medication list which includes the following  long-term medication(s): albuterol , amitriptyline , fluticasone -salmeterol, and omeprazole .  Pharmacotherapy (Medications Ordered): No orders of the defined types were placed in this encounter.  Orders:  No orders of the defined types were placed in this encounter.    {There is no content from the last Plan section.}   No follow-ups on file.    Recent Visits Date Type Provider Dept  03/13/24 Office Visit Eliza Green K, NP [facility]  Showing recent visits within past 90 days and meeting all other requirements Future Appointments Date Type Provider Dept  05/09/24 Appointment Adamari Frede K, NP [facility]  Showing future appointments within next 90 days and meeting all other requirements  I discussed the assessment and treatment plan with the patient. The patient was provided an opportunity to ask questions and all were answered. The patient agreed with the plan and demonstrated an understanding of the instructions.  Patient advised to call back or seek an in-person evaluation if the symptoms or condition worsens.  Duration of encounter: *** minutes.  Total time on encounter, as per AMA guidelines included both the face-to-face and non-face-to-face time personally spent by the physician and/or other qualified health care professional(s)  on the day of the encounter (includes time in activities that require the physician or other qualified health care professional and does not include time in activities normally performed by clinical staff). PhysicianClarks time may include the following activities when performed: Preparing to see the patient (e.g., pre-charting review of records, searching for previously ordered imaging, lab work, and nerve conduction tests) Review of prior analgesic pharmacotherapies. Reviewing PMP Interpreting ordered tests (e.g., lab work, imaging, nerve conduction tests) Performing post-procedure evaluations, including interpretation of diagnostic  procedures Obtaining and/or reviewing separately obtained history Performing a medically appropriate examination and/or evaluation Counseling and educating the patient/family/caregiver Ordering medications, tests, or procedures Referring and communicating with other health care professionals (when not separately reported) Documenting clinical information in the electronic or other health record Independently interpreting results (not separately reported) and communicating results to the patient/ family/caregiver Care coordination (not separately reported)  Note by: Kalanie Fewell K Ameirah Khatoon, NP (TTS and AI technology used. I apologize for any typographical errors that were not detected and corrected.) Date: 05/09/2024; Time: 3:33 PM

## 2024-05-09 ENCOUNTER — Ambulatory Visit: Admitting: Nurse Practitioner

## 2024-05-09 DIAGNOSIS — Z79899 Other long term (current) drug therapy: Secondary | ICD-10-CM

## 2024-05-09 DIAGNOSIS — M47816 Spondylosis without myelopathy or radiculopathy, lumbar region: Secondary | ICD-10-CM

## 2024-05-09 DIAGNOSIS — M4310 Spondylolisthesis, site unspecified: Secondary | ICD-10-CM

## 2024-05-09 DIAGNOSIS — Z91199 Patient's noncompliance with other medical treatment and regimen due to unspecified reason: Secondary | ICD-10-CM

## 2024-05-09 DIAGNOSIS — M961 Postlaminectomy syndrome, not elsewhere classified: Secondary | ICD-10-CM

## 2024-05-09 DIAGNOSIS — G894 Chronic pain syndrome: Secondary | ICD-10-CM

## 2024-05-09 NOTE — Telephone Encounter (Signed)
 Oral Oncology Patient Advocate Encounter  Refaxed over dr signature this morning  Lucie Lamer, CPhT [place]  [facility] Oncology Pharmacy Patient Advocate Specialist [facility] [place] Phone: [phone]  Fax: [phone] [email or web address]

## 2024-05-14 ENCOUNTER — Other Ambulatory Visit: Payer: Self-pay

## 2024-05-14 DIAGNOSIS — C50919 Malignant neoplasm of unspecified site of unspecified female breast: Secondary | ICD-10-CM

## 2024-05-14 DIAGNOSIS — E876 Hypokalemia: Secondary | ICD-10-CM

## 2024-05-15 ENCOUNTER — Inpatient Hospital Stay: Attending: Hematology

## 2024-05-15 DIAGNOSIS — C50919 Malignant neoplasm of unspecified site of unspecified female breast: Secondary | ICD-10-CM

## 2024-05-15 DIAGNOSIS — C50511 Malignant neoplasm of lower-outer quadrant of right female breast: Secondary | ICD-10-CM | POA: Insufficient documentation

## 2024-05-15 DIAGNOSIS — Z170 Estrogen receptor positive status [ER+]: Secondary | ICD-10-CM | POA: Diagnosis not present

## 2024-05-15 DIAGNOSIS — Z1732 Human epidermal growth factor receptor 2 negative status: Secondary | ICD-10-CM | POA: Diagnosis not present

## 2024-05-15 DIAGNOSIS — C7800 Secondary malignant neoplasm of unspecified lung: Secondary | ICD-10-CM | POA: Insufficient documentation

## 2024-05-15 DIAGNOSIS — Z1721 Progesterone receptor positive status: Secondary | ICD-10-CM | POA: Insufficient documentation

## 2024-05-15 DIAGNOSIS — Z5111 Encounter for antineoplastic chemotherapy: Secondary | ICD-10-CM | POA: Insufficient documentation

## 2024-05-15 DIAGNOSIS — E876 Hypokalemia: Secondary | ICD-10-CM

## 2024-05-15 LAB — CMP (CANCER CENTER ONLY)
ALT: 52 U/L — ABNORMAL HIGH (ref 0–44)
AST: 67 U/L — ABNORMAL HIGH (ref 15–41)
Albumin: 3.9 g/dL (ref 3.5–5.0)
Alkaline Phosphatase: 225 U/L — ABNORMAL HIGH (ref 38–126)
Anion gap: 9 (ref 5–15)
BUN: 14 mg/dL (ref 6–20)
CO2: 29 mmol/L (ref 22–32)
Calcium: 9.6 mg/dL (ref 8.9–10.3)
Chloride: 106 mmol/L (ref 98–111)
Creatinine: 1.25 mg/dL — ABNORMAL HIGH (ref 0.44–1.00)
GFR, Estimated: 49 mL/min — ABNORMAL LOW (ref >60)
Glucose, Bld: 86 mg/dL (ref 70–99)
Potassium: 4 mmol/L (ref 3.5–5.1)
Sodium: 143 mmol/L (ref 135–145)
Total Bilirubin: 0.7 mg/dL (ref 0.0–1.2)
Total Protein: 7.3 g/dL (ref 6.5–8.1)

## 2024-05-15 LAB — CBC WITH DIFFERENTIAL (CANCER CENTER ONLY)
Abs Immature Granulocytes: 0.01 K/uL (ref 0.00–0.07)
Basophils Absolute: 0 K/uL (ref 0.0–0.1)
Basophils Relative: 1 %
Eosinophils Absolute: 0.1 K/uL (ref 0.0–0.5)
Eosinophils Relative: 1 %
HCT: 33.1 % — ABNORMAL LOW (ref 36.0–46.0)
Hemoglobin: 11 g/dL — ABNORMAL LOW (ref 12.0–15.0)
Immature Granulocytes: 0 %
Lymphocytes Relative: 53 %
Lymphs Abs: 2.1 K/uL (ref 0.7–4.0)
MCH: 34.8 pg — ABNORMAL HIGH (ref 26.0–34.0)
MCHC: 33.2 g/dL (ref 30.0–36.0)
MCV: 104.7 fL — ABNORMAL HIGH (ref 80.0–100.0)
Monocytes Absolute: 0.4 K/uL (ref 0.1–1.0)
Monocytes Relative: 10 %
Neutro Abs: 1.4 K/uL — ABNORMAL LOW (ref 1.7–7.7)
Neutrophils Relative %: 35 %
Platelet Count: 175 K/uL (ref 150–400)
RBC: 3.16 MIL/uL — ABNORMAL LOW (ref 3.87–5.11)
RDW: 14.1 % (ref 11.5–15.5)
WBC Count: 4 K/uL (ref 4.0–10.5)
nRBC: 0 % (ref 0.0–0.2)

## 2024-05-15 MED ORDER — FULVESTRANT 250 MG/5ML IM SOSY
500.0000 mg | PREFILLED_SYRINGE | Freq: Once | INTRAMUSCULAR | Status: AC
  Administered 2024-05-15: 500 mg via INTRAMUSCULAR
  Filled 2024-05-15: qty 10

## 2024-05-16 LAB — CANCER ANTIGEN 15-3: CA 15-3: 51.5 U/mL — ABNORMAL HIGH (ref 0.0–25.0)

## 2024-05-16 LAB — CANCER ANTIGEN 27.29: CA 27.29: 78.3 U/mL — ABNORMAL HIGH (ref 0.0–38.6)

## 2024-05-20 ENCOUNTER — Ambulatory Visit (HOSPITAL_BASED_OUTPATIENT_CLINIC_OR_DEPARTMENT_OTHER): Admitting: Pulmonary Disease

## 2024-05-20 ENCOUNTER — Encounter (HOSPITAL_BASED_OUTPATIENT_CLINIC_OR_DEPARTMENT_OTHER): Payer: Self-pay | Admitting: Pulmonary Disease

## 2024-05-20 NOTE — Telephone Encounter (Signed)
 Oral Oncology Patient Advocate Encounter   Received notification re-enrollment for assistance for Verzenio  through [facility] has been approved. Patient may continue to receive their medication at $0 from this program.    [facility] phone number [phone].    Effective dates: 06/06/24 through 06/05/25  I have spoken to the patient.  Lucie Lamer, CPhT [place]  [facility] Oncology Pharmacy Patient Advocate Specialist [facility] [place] Phone: [phone]  Fax: [phone] [email or web address]

## 2024-05-23 ENCOUNTER — Other Ambulatory Visit (HOSPITAL_BASED_OUTPATIENT_CLINIC_OR_DEPARTMENT_OTHER): Payer: Self-pay | Admitting: Pulmonary Disease

## 2024-05-26 ENCOUNTER — Other Ambulatory Visit: Payer: Self-pay | Admitting: Hematology

## 2024-05-26 DIAGNOSIS — C50919 Malignant neoplasm of unspecified site of unspecified female breast: Secondary | ICD-10-CM

## 2024-05-27 ENCOUNTER — Other Ambulatory Visit: Payer: Self-pay | Admitting: Family

## 2024-05-27 ENCOUNTER — Encounter: Payer: Self-pay | Admitting: Hematology

## 2024-05-27 DIAGNOSIS — L309 Dermatitis, unspecified: Secondary | ICD-10-CM

## 2024-05-27 DIAGNOSIS — F5101 Primary insomnia: Secondary | ICD-10-CM

## 2024-05-28 NOTE — Telephone Encounter (Signed)
 Pharmacy requested refill.  Epic LR: 02/07/2024 Contract Date: 03/15/2023 Note added to upcoming appointment to update.   Pended Rx and sent to [facility] for approval

## 2024-06-10 DIAGNOSIS — C50919 Malignant neoplasm of unspecified site of unspecified female breast: Secondary | ICD-10-CM

## 2024-06-12 ENCOUNTER — Inpatient Hospital Stay: Attending: Hematology

## 2024-06-12 ENCOUNTER — Inpatient Hospital Stay

## 2024-06-12 ENCOUNTER — Inpatient Hospital Stay: Admitting: Hematology

## 2024-06-12 VITALS — BP 126/63 | HR 97 | Temp 97.7°F | Resp 20 | Wt 205.7 lb

## 2024-06-12 DIAGNOSIS — Z5111 Encounter for antineoplastic chemotherapy: Secondary | ICD-10-CM | POA: Diagnosis present

## 2024-06-12 DIAGNOSIS — Z1732 Human epidermal growth factor receptor 2 negative status: Secondary | ICD-10-CM | POA: Insufficient documentation

## 2024-06-12 DIAGNOSIS — Z1721 Progesterone receptor positive status: Secondary | ICD-10-CM | POA: Diagnosis not present

## 2024-06-12 DIAGNOSIS — C50511 Malignant neoplasm of lower-outer quadrant of right female breast: Secondary | ICD-10-CM | POA: Diagnosis present

## 2024-06-12 DIAGNOSIS — C7800 Secondary malignant neoplasm of unspecified lung: Secondary | ICD-10-CM | POA: Diagnosis present

## 2024-06-12 DIAGNOSIS — C50919 Malignant neoplasm of unspecified site of unspecified female breast: Secondary | ICD-10-CM | POA: Diagnosis not present

## 2024-06-12 DIAGNOSIS — Z170 Estrogen receptor positive status [ER+]: Secondary | ICD-10-CM | POA: Diagnosis not present

## 2024-06-12 LAB — CMP (CANCER CENTER ONLY)
ALT: 71 U/L — ABNORMAL HIGH (ref 0–44)
AST: 78 U/L — ABNORMAL HIGH (ref 15–41)
Albumin: 3.9 g/dL (ref 3.5–5.0)
Alkaline Phosphatase: 269 U/L — ABNORMAL HIGH (ref 38–126)
Anion gap: 8 (ref 5–15)
BUN: 16 mg/dL (ref 6–20)
CO2: 30 mmol/L (ref 22–32)
Calcium: 9.6 mg/dL (ref 8.9–10.3)
Chloride: 101 mmol/L (ref 98–111)
Creatinine: 1.29 mg/dL — ABNORMAL HIGH (ref 0.44–1.00)
GFR, Estimated: 48 mL/min — ABNORMAL LOW
Glucose, Bld: 91 mg/dL (ref 70–99)
Potassium: 3.9 mmol/L (ref 3.5–5.1)
Sodium: 140 mmol/L (ref 135–145)
Total Bilirubin: 0.6 mg/dL (ref 0.0–1.2)
Total Protein: 7.6 g/dL (ref 6.5–8.1)

## 2024-06-12 LAB — CBC WITH DIFFERENTIAL (CANCER CENTER ONLY)
Abs Immature Granulocytes: 0 K/uL (ref 0.00–0.07)
Basophils Absolute: 0 K/uL (ref 0.0–0.1)
Basophils Relative: 1 %
Eosinophils Absolute: 0.1 K/uL (ref 0.0–0.5)
Eosinophils Relative: 1 %
HCT: 35.4 % — ABNORMAL LOW (ref 36.0–46.0)
Hemoglobin: 11.9 g/dL — ABNORMAL LOW (ref 12.0–15.0)
Immature Granulocytes: 0 %
Lymphocytes Relative: 41 %
Lymphs Abs: 1.7 K/uL (ref 0.7–4.0)
MCH: 34.6 pg — ABNORMAL HIGH (ref 26.0–34.0)
MCHC: 33.6 g/dL (ref 30.0–36.0)
MCV: 102.9 fL — ABNORMAL HIGH (ref 80.0–100.0)
Monocytes Absolute: 0.3 K/uL (ref 0.1–1.0)
Monocytes Relative: 6 %
Neutro Abs: 2.2 K/uL (ref 1.7–7.7)
Neutrophils Relative %: 51 %
Platelet Count: 187 K/uL (ref 150–400)
RBC: 3.44 MIL/uL — ABNORMAL LOW (ref 3.87–5.11)
RDW: 13.7 % (ref 11.5–15.5)
WBC Count: 4.2 K/uL (ref 4.0–10.5)
nRBC: 0 % (ref 0.0–0.2)

## 2024-06-12 MED ORDER — FULVESTRANT 250 MG/5ML IM SOSY
500.0000 mg | PREFILLED_SYRINGE | Freq: Once | INTRAMUSCULAR | Status: AC
  Administered 2024-06-12: 500 mg via INTRAMUSCULAR
  Filled 2024-06-12: qty 10

## 2024-06-12 NOTE — Progress Notes (Signed)
 " HEMATOLOGY ONCOLOGY PROGRESS NOTE  Date of service: 06/12/2024  Patient Care Team: Ngetich, Roxan BROCKS, NP as PCP - General (Family Medicine) Tobb, Kardie, DO as PCP - Cardiology (Cardiology) Cristopher Suzen HERO, NP as Nurse Practitioner  CHIEF COMPLAINT/PURPOSE OF CONSULTATION: Follow-up for continued evaluation and management of metastatic ER/PR positive HER2 negative breast cancer  HISTORY OF PRESENTING ILLNESS: Plz see previous note for details of initial presentation.    SUMMARY OF ONCOLOGIC HISTORY: Oncology History   No problem history exists.    INTERVAL HISTORY: Brandy [name]  is a 59 y.o. female who is here today for continued evaluation and management of metastatic ER/PR positive HER2 negative breast cancer.   she was last seen by me on 04/18/2024; at the time she did not have any concerns and was doing well.   Today, she states that she is experiencing cramps in her hands, legs, and feet. She also believes that she is not drinking enough water. She is eating ice pops as a method of water consumption.  She is tolerating her medications well.  She has pain in her ankles that she is seeing an orthopedic provider for.   She notes that she is sleeping much more than usual.  She is using a new C-PAP machine which is working well.   Denies abnormal back pain or leg swelling.  REVIEW OF SYSTEMS:   10 Point review of systems of done and is negative except as noted above.  MEDICAL HISTORY Past Medical History:  Diagnosis Date   Acute pansinusitis 08/02/2017   Anxiety    Arthritis    ASD (atrial septal defect)    s/p closure with Amplatzer device 10/05/04 (Dr. DOROTHA Franky Minerva, [facility]) 10/05/04   Cancer [facility])    Cataract    Chronic pain    CKD (chronic kidney disease)    Dyspnea    Fatty liver 09/04/2019   GERD (gastroesophageal reflux disease)    Heart murmur    no longer heard   History of hiatal hernia    Hyperlipidemia    Legally blind in right  eye, as defined in [place]     Lumbar herniated disc    Migraines    On home oxygen  therapy 06/15/2022   Pt was given O2 on D/C from [facility] 04/2022   OSA on CPAP 06/15/2022   PONV (postoperative nausea and vomiting)    Sciatica     SURGICAL HISTORY Past Surgical History:  Procedure Laterality Date   ABLATION     BREAST LUMPECTOMY WITH RADIOACTIVE SEED AND SENTINEL LYMPH NODE BIOPSY Right 11/23/2020   Procedure: RIGHT BREAST LUMPECTOMY WITH RADIOACTIVE SEED AND RIGHT AXILLARY SENTINEL LYMPH NODE BIOPSY;  Surgeon: Ebbie Cough, MD;  Location: [facility] OR;  Service: General;  Laterality: Right;   BREAST SURGERY Bilateral 2011   Breast Reduction Surgery   BUNIONECTOMY     CARDIAC CATHETERIZATION     10/05/04 [facility]): LM < 25%, otherwise normal coronaries. No pulmonary HTN, Mildly enlarged RV. Secundum ASD s/p closure.   CARDIAC SURGERY     CATARACT EXTRACTION     CLEFT PALATE REPAIR     s/p cleft lip and palate repair   EYE SURGERY Right 2019   right eye removed   IR IMAGING GUIDED PORT INSERTION  12/06/2023   LUMBAR LAMINECTOMY/DECOMPRESSION MICRODISCECTOMY Left 05/23/2016   Procedure: LEFT L4-L5 LATERAL RECESS DECOMPRESSION WITH CENTRAL AND RIGHT DECOMPRESSION VIA LEFT SIDE;  Surgeon: Lynwood FORBES Better, MD;  Location: [facility] OR;  Service: Orthopedics;  Laterality: Left;   LUMBAR LAMINECTOMY/DECOMPRESSION MICRODISCECTOMY Left 05/23/2016   Procedure: LUMBAR LAMINECTOMY/DECOMPRESSION MICRODISCECTOMY Lumbar five - Sacral One 1 LEVEL;  Surgeon: Lynwood FORBES Better, MD;  Location: [facility] OR;  Service: Orthopedics;  Laterality: Left;   REDUCTION MAMMAPLASTY  2011   SHOULDER INJECTION Left 05/23/2016   Procedure: SHOULDER INJECTION;  Surgeon: Lynwood FORBES Better, MD;  Location: [facility] OR;  Service: Orthopedics;  Laterality: Left;  band-aid per pa-c   TRANSTHORACIC ECHOCARDIOGRAM     12/15/05 [facility]): Mild LVH, EF > 55%, grade 1 diastolic dysfunction, Trivial MR/PR/TR.   TUBAL LIGATION      SOCIAL HISTORY Social  History[1]  Social History   Social History Narrative   Tobacco use, amount per day now: None.   Past tobacco use, amount per day: 1/4   How many years did you use tobacco: Intermittent x 20 years   Alcohol  use (drinks per week): N/A   Diet: Plant Base   Do you drink/eat things with caffeine: Coffee, Tea, Soda.   Marital status:    Married                              What year were you married? 1999   Do you live in a house, apartment, assisted living, condo, trailer, etc.? House    Is it one or more stories? 2   How many persons live in your home? 6 adults, 5 children.   Do you have pets in your home?( please list) 1 Terrier   Highest Level of education completed? AAS   Current or past profession: LPN   Do you exercise?   A little                               Type and how often? Walk, stretches.    Do you have a living will? No   Do you have a DNR form?   No                                If not, do you want to discuss one?   Do you have signed POA/HPOA forms? No                       If so, please bring to you appointment      Do you have any difficulty bathing or dressing yourself? No   Do you have any difficulty preparing food or eating? No   Do you have any difficulty managing your medications? No   Do you have any difficulty managing your finances? No   Do you have any difficulty affording your medications? No.    SOCIAL DRIVERS OF HEALTH SDOH Screenings   Food Insecurity: Food Insecurity Present (03/12/2024)  Housing: Low Risk (03/12/2024)  Transportation Needs: Unmet Transportation Needs (03/12/2024)  Utilities: Not At Risk (10/18/2023)  Alcohol  Screen: Low Risk (10/18/2023)  Depression (PHQ2-9): Low Risk (03/14/2024)  Financial Resource Strain: Medium Risk (03/12/2024)  Physical Activity: Insufficiently Active (03/12/2024)  Social Connections: Moderately Isolated (03/12/2024)  Stress: No Stress Concern Present (03/12/2024)  Tobacco Use: Medium Risk (04/25/2024)      FAMILY HISTORY Family History  Problem Relation Age of Onset   Arthritis Mother    Hypertension Mother    Diabetes Mother    High blood pressure Mother  Cancer Father        Lung   Colon cancer Neg Hx    Colon polyps Neg Hx    Esophageal cancer Neg Hx    Rectal cancer Neg Hx    Stomach cancer Neg Hx      ALLERGIES: is allergic to zithromax [azithromycin], tramadol , and psyllium.  MEDICATIONS  Current Outpatient Medications  Medication Sig Dispense Refill   acetaminophen  (TYLENOL ) 500 MG tablet Take 500 mg by mouth daily as needed for moderate pain.     albuterol  (VENTOLIN  HFA) 108 (90 Base) MCG/ACT inhaler Inhale 2 puffs into the lungs every 6 (six) hours as needed for wheezing or shortness of breath. 8 g 6   ALPRAZolam  (XANAX ) 1 MG tablet TAKE 1 TABLET BY MOUTH AT  BEDTIME AS NEEDED FOR ANXIETY 30 tablet 3   amitriptyline  (ELAVIL ) 75 MG tablet TAKE 1 TABLET BY MOUTH AT  BEDTIME 30 tablet 11   Buprenorphine  HCl (BELBUCA ) 300 MCG FILM Place 1 Film inside cheek 2 (two) times daily as needed. Must last 30 days. 60 Film 2   Carboxymethylcellul-Glycerin (LUBRICATING EYE DROPS OP) Place 1 drop into the right eye in the morning, at noon, and at bedtime.     cyanocobalamin  (VITAMIN B12) 500 MCG tablet Take 1 tablet (500 mcg total) by mouth daily. 30 tablet 0   Evolocumab  (REPATHA  SURECLICK) 140 MG/ML SOAJ Inject 1 pen subcutaneously every 14 days.  Must schedule MD visit for further refills 2 mL 0   fluticasone -salmeterol (ADVAIR) 250-50 MCG/ACT AEPB INHALE 1 INHALATION BY MOUTH  INTO THE LUNGS IN THE MORNING  AND AT BEDTIME 180 each 1   ketoconazole  (NIZORAL ) 2 % cream APPLY TOPICALLY TO AFFECTED  AREA(S) TWICE DAILY 60 g 0   lidocaine -prilocaine  (EMLA ) cream Apply topically daily.     loperamide  (IMODIUM ) 2 MG capsule Take 1-2 capsules (2-4 mg total) by mouth 4 (four) times daily as needed for diarrhea or loose stools. 30 capsule 1   magnesium  30 MG tablet Take 30 mg by mouth 2  (two) times daily.     methocarbamol  (ROBAXIN ) 500 MG tablet Take 1 tablet (500 mg total) by mouth every 6 (six) hours as needed for muscle spasms. 40 tablet 2   midodrine  (PROAMATINE ) 10 MG tablet TAKE 1 TABLET BY MOUTH IN THE  MORNING AND AT BEDTIME 200 tablet 0   Multiple Vitamin (MULTIVITAMIN WITH MINERALS) TABS tablet Take 1 tablet by mouth daily.     neomycin-polymyxin b -dexamethasone  (MAXITROL) 3.5-10000-0.1 OINT      omeprazole  (PRILOSEC  OTC) 20 MG tablet Take 20 mg by mouth daily.     triamcinolone  cream (KENALOG ) 0.1 % APPLY TOPICALLY TO AFFECTED  AREA(S) TWICE DAILY 30 g 0   VERZENIO  100 MG tablet TAKE 1 TABLET BY MOUTH TWICE DAILY 56 tablet 0   Vitamin D , Ergocalciferol , (DRISDOL ) 1.25 MG (50000 UNIT) CAPS capsule TAKE 1 CAPSULE BY MOUTH EVERY 7  DAYS ( MONDAY ) 15 capsule 2   No current facility-administered medications for this visit.    PHYSICAL EXAMINATION: ECOG PERFORMANCE STATUS: 1 - Symptomatic but completely ambulatory VITALS: Vitals:   06/12/24 1430  BP: 126/63  Pulse: 97  Resp: 20  Temp: 97.7 F (36.5 C)  SpO2: 95%   Filed Weights   06/12/24 1430  Weight: 205 lb 11.2 oz (93.3 kg)   Body mass index is 32.22 kg/m.  GENERAL: alert, in no acute distress and comfortable SKIN: no acute rashes, no significant lesions EYES: conjunctiva are  pink and non-injected, sclera anicteric OROPHARYNX: MMM, no exudates, no oropharyngeal erythema or ulceration NECK: supple, no JVD LYMPH:  no palpable lymphadenopathy in the cervical, axillary or inguinal regions LUNGS: clear to auscultation b/l with normal respiratory effort HEART: regular rate & rhythm ABDOMEN:  normoactive bowel sounds , non tender, not distended, no hepatosplenomegaly Extremity: no pedal edema PSYCH: alert & oriented x 3 with fluent speech NEURO: no focal motor/sensory deficits  LABORATORY DATA:   I have reviewed the data as listed     Latest Ref Rng & Units 06/12/2024    2:04 PM 05/15/2024     1:12 PM 04/18/2024    8:41 AM  CBC EXTENDED  WBC 4.0 - 10.5 K/uL 4.2  4.0  4.7   RBC 3.87 - 5.11 MIL/uL 3.44  3.16  2.97   Hemoglobin 12.0 - 15.0 g/dL 88.0  88.9  89.4   HCT 36.0 - 46.0 % 35.4  33.1  30.9   Platelets 150 - 400 K/uL 187  175  167   NEUT# 1.7 - 7.7 K/uL 2.2  1.4  1.3   Lymph# 0.7 - 4.0 K/uL 1.7  2.1  2.9         Latest Ref Rng & Units 06/12/2024    2:04 PM 05/15/2024    1:12 PM 04/18/2024    8:41 AM  CMP  Glucose 70 - 99 mg/dL 91  86  838   BUN 6 - 20 mg/dL 16  14  20    Creatinine 0.44 - 1.00 mg/dL 8.70  8.74  8.54   Sodium 135 - 145 mmol/L 140  143  140   Potassium 3.5 - 5.1 mmol/L 3.9  4.0  3.5   Chloride 98 - 111 mmol/L 101  106  103   CO2 22 - 32 mmol/L 30  29  29    Calcium  8.9 - 10.3 mg/dL 9.6  9.6  8.8   Total Protein 6.5 - 8.1 g/dL 7.6  7.3  6.5   Total Bilirubin 0.0 - 1.2 mg/dL 0.6  0.7  0.8   Alkaline Phos 38 - 126 U/L 269  225  220   AST 15 - 41 U/L 78  67  64   ALT 0 - 44 U/L 71  52  60          RADIOGRAPHIC STUDIES: I have personally reviewed the radiological images as listed and agreed with the findings in the report.  CT CHEST, ABDOMEN, AND PELVIS WITH CONTRAST 01/10/2024  CLINICAL DATA:  Metastatic breast cancer assess treatment response * Tracking Code: BO *   TECHNIQUE: Multidetector CT imaging of the chest, abdomen and pelvis was performed following the standard protocol during bolus administration of intravenous contrast.   RADIATION DOSE REDUCTION: This exam was performed according to the departmental dose-optimization program which includes automated exposure control, adjustment of the mA and/or kV according to patient size and/or use of iterative reconstruction technique.   CONTRAST:  OMNIPAQUE  IOHEXOL  300 MG/ML  SOLN   COMPARISON:  05/19/2023   FINDINGS: CT CHEST FINDINGS   Cardiovascular: Right chest port catheter. Normal heart size. No pericardial effusion.   Mediastinum/Nodes: No enlarged mediastinal, hilar,  or axillary lymph nodes. Thyroid  gland, trachea, and esophagus demonstrate no significant findings.   Lungs/Pleura: Unchanged 0.7 cm nodule of the peripheral right apex (series 6, image 31). Unchanged 0.4 cm nodule in the posterior left apex (series 6, image 29). Background of fine tiny nodules, concentrated in the lung apices. No pleural  effusion or pneumothorax.   Musculoskeletal: Status post right lumpectomy and axillary lymph node dissection. No acute osseous findings.   CT ABDOMEN PELVIS FINDINGS   Hepatobiliary: No solid liver abnormality is seen. Tiny gallstones. No gallbladder wall thickening, or biliary dilatation.   Pancreas: Unremarkable. No pancreatic ductal dilatation or surrounding inflammatory changes.   Spleen: Normal in size without significant abnormality.   Adrenals/Urinary Tract: Adrenal glands are unremarkable. Kidneys are normal, without renal calculi, solid lesion, or hydronephrosis. Bladder is unremarkable.   Stomach/Bowel: Stomach is within normal limits. Appendix appears normal. No evidence of bowel wall thickening, distention, or inflammatory changes. Moderate burden of stool throughout the colon.   Vascular/Lymphatic: Aortic atherosclerosis. No enlarged abdominal or pelvic lymph nodes.   Reproductive: No mass or other abnormality.   Other: No abdominal wall hernia or abnormality. No ascites.   Musculoskeletal: No acute osseous findings.   IMPRESSION: 1. Unchanged 0.7 cm nodule of the peripheral right apex. Unchanged 0.4 cm nodule in the posterior left apex. Background of fine tiny nodules, concentrated in the lung apices. 2. Status post right lumpectomy and axillary lymph node dissection. 3. No evidence of lymphadenopathy or metastatic disease in the abdomen or pelvis. 4. Cholelithiasis.   Aortic Atherosclerosis (ICD10-I70.0).      DIGITAL DIAGNOSTIC BILATERAL MAMMOGRAM WITH TOMOSYNTHESIS AND CAD 11/29/2023  CLINICAL DATA:  59 year old  female presents with diffuse intermittent RIGHT breast pain. History of RIGHT breast cancer and lumpectomy in 2022.   TECHNIQUE: Bilateral digital diagnostic mammography and breast tomosynthesis was performed. The images were evaluated with computer-aided detection.   COMPARISON:  Previous exam(s).   ACR Breast Density Category b: There are scattered areas of fibroglandular density.   FINDINGS: Full field views of both breasts and a magnification view of the lumpectomy site demonstrate no suspicious mass, nonsurgical distortion or worrisome calcifications.   RIGHT lumpectomy changes with surgical and biopsy clips in this area again noted. No new changes are present.   IMPRESSION: No new or suspicious findings within either breast. RIGHT lumpectomy changes again noted.   RECOMMENDATION: Per protocol, as the patient is now 2 or more years status post lumpectomy, she may return to annual screening mammography in 1 year. However, given the history of breast cancer, the patient remains eligible for annual diagnostic mammography if preferred. (Code:SM-B-01Y)   Recommend clinical follow-up as indicated. Any further workup should be based on clinical grounds.   I have discussed the findings and recommendations with the patient. If applicable, a reminder letter will be sent to the patient regarding the next appointment.   BI-RADS CATEGORY  2: Benign.          MM DIAG BREAST TOMO BILATERAL  Result Date: 01/04/2022 CLINICAL DATA:  Patient has a history of metastatic right breast cancer diagnosed in October of 2020. Patient is status post right breast lumpectomy in June of 2022. EXAM: DIGITAL DIAGNOSTIC BILATERAL MAMMOGRAM WITH TOMOSYNTHESIS TECHNIQUE: Bilateral digital diagnostic mammography and breast tomosynthesis was performed. COMPARISON:  Previous exam(s). ACR Breast Density Category b: There are scattered areas of fibroglandular density. FINDINGS: Cc and MLO views of bilateral  breasts are submitted. Postsurgical changes are identified in the right breast. The left breast is stable. IMPRESSION: Benign findings. RECOMMENDATION: Bilateral diagnostic mammogram in 1 year. I have discussed the findings and recommendations with the patient. If applicable, a reminder letter will be sent to the patient regarding the next appointment. BI-RADS CATEGORY  2: Benign. Electronically Signed   By: Craig Melia HERO.D.  On: 01/04/2022 16:27      09/02/2020 Surgical Pathology        ASSESSMENT & PLAN:  59 y.o. female with  1. Metastatic breast cancer ER+/PRneg/Her2 neg   02/28/2019 neck CT with results revealing negative for mass or adenopathy in the neck.   03/04/2019 head MRI with results revealing Negative for metastatic disease.  No acute abnormality in the brain.   01/04/2022 Mammogram benign   2. Pulmonary metastases   02/18/2019 chest and abdomen with results revealing 5cm spiculated soft tissue mass in inferior right breast, highly suspicious for primary breast carcinoma. No acute findings or metastatic disease within the abdomen or pelvis. Multiple small pulmonary nodules in both lung bases, consistent with pulmonary metastases. 4.5 cm uterine fibroid.   02/28/2019 C/A/P CT with results revealing Irregular solid 5.0 cm right breast mass, suspicious for primary right breast malignancy. Innumerable solid pulmonary nodules scattered throughout both lungs, compatible with pulmonary metastases. No evidence of metastatic disease in the abdomen, pelvis or skeleton. Mildly enlarged and probably myomatous uterus. Simple 1.4 cm left adnexal cyst requires no follow-up. This recommendation follows ACR consensus guidelines: White Paper of the ACR Incidental Findings Committee II on Adnexal Findings. J Am Coll Radiol [phone]. Aortic Atherosclerosis (ICD10-I70.0).   NUCLEAR MEDICINE WHOLE BODY BONE SCAN completed on 03/19/2019 with results revealing 1. No scintigraphic  evidence skeletal metastasis. 2. 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    04/09/2019 Bone Density ([id number]) which revealed The BMD measured at Femur Neck Right is 1.153 g/cm2 with a T-score of 0.8. This patient is considered normal according to World Health Organization [facility]) criteria. Lumbar spine was not utilized due to advanced degenerative changes. The scan quality is good. Femur Neck Right 04/09/2019 54.7 Normal 0.8 1.153 g/cm2. Left Forearm Radius 33% 04/09/2019 54.7 Normal 0.8 0.949 g/cm2.   08/04/2019 CT Angio Chest ([id number]) revealed 1. No lobar or central pulmonary embolus detected. Exam is limited secondary to respiratory motion. 2. Mild ground-glass attenuation may represent mild pneumonitis or areas of air trapping. 3. Signs of atrial septal closure. 4. Decrease in size and number of bilateral pulmonary nodules, marked response noted on today's exam with the only nodule remaining near a cm in the right upper lobe and the smaller nodules that were present on the previous examination throughout the chest no longer measurable though the lower lobes are limited by respiratory motion. 5. Decreased size of right breast mass. 6. Probable hepatic steatosis.   S/p rt breast lumpectomy on 6/20 -- no residual carcinoma on pathology.   3. Elevated LFTs 2/2 extensive hepatic steatosis -Under the care of Dr. Shila and last seen on 03/26/2020 -07/03/2019 US  Abd revealed No acute findings. Normal gallbladder. No bile duct dilation. 2. Significant increased liver parenchymal echogenicity consistent with extensive hepatic steatosis.  PLAN: - Discussed lab results on 06/12/2024 in detail with patient: - CMP   - Creatinine:  1.29 - Calcium : 9.6 - CBC  - Hemoglobin: 11.9  - WBC: 4.2  - Platelets: 187  No new symptoms suggestive of disease progression. No notable new toxicities from current treatment -continue Verzenio  + Faslodex  - Plz  schedule monthly faslodex  x 12 with portflush and portflush labs  - Return to clinic with Dr. Onesimo with port flush and labs in 3 months  FOLLOW-UP in 3 months for labs and follow-up with Dr. Onesimo.  The total time spent in the appointment was 30 minutes* .  All of the patient's  questions were answered and the patient knows to call the clinic with any problems, questions, or concerns.  Emaline Saran MD MS AAHIVMS [facility] Hematology/Oncology Physician [facility]  *Total Encounter Time as defined by the Centers for Medicare and Medicaid Services includes, in addition to the face-to-face time of a patient visit (documented in the note above) non-face-to-face time: obtaining and reviewing outside history, ordering and reviewing medications, tests or procedures, care coordination (communications with other health care professionals or caregivers) and documentation in the medical record.  I, Marijo Sharps, acting as a neurosurgeon for Emaline Saran, MD.,have documented all relevant documentation on the behalf of Emaline Saran, MD,as directed by  Emaline Saran, MD while in the presence of Emaline Saran, MD.  I have reviewed the above documentation for accuracy and completeness, and I agree with the above.  Emaline Saran, MD      [1]  Social History Tobacco Use   Smoking status: Former    Current packs/day: 0.00    Average packs/day: 0.3 packs/day for 25.0 years (6.3 ttl pk-yrs)    Types: Cigarettes    Start date: 02/04/1993    Quit date: 02/04/2018    Years since quitting: 6.3   Smokeless tobacco: Never  Vaping Use   Vaping status: Never Used  Substance Use Topics   Alcohol  use: No   Drug use: No   "

## 2024-06-13 LAB — CANCER ANTIGEN 27.29: CA 27.29: 83.4 U/mL — ABNORMAL HIGH (ref 0.0–38.6)

## 2024-06-13 LAB — CANCER ANTIGEN 15-3: CA 15-3: 50.8 U/mL — ABNORMAL HIGH (ref 0.0–25.0)

## 2024-06-14 ENCOUNTER — Telehealth: Payer: Self-pay

## 2024-06-14 DIAGNOSIS — Z970 Presence of artificial eye: Secondary | ICD-10-CM

## 2024-06-14 NOTE — Telephone Encounter (Signed)
 Copied from CRM #[id number]. Topic: Referral - Request for Referral >> Jun 13, 2024  1:45 PM Brandy [name] wrote: Brandy [name] is requesting a referral ( per [facility])  for her to clean pt Brandy [name]'s prosthetic eye. (which is done twice a year) . Also, Brandy [name] stated this is a new process, because she has never had to request a referral in the past.  Please advise

## 2024-06-14 NOTE — Telephone Encounter (Signed)
 Referral ordered to [facility] with [facility] for eye prosthetic cleaning as requested.Fax # [phone]

## 2024-06-14 NOTE — Addendum Note (Signed)
 Addended byBETHA LEONARDA BURDOCK C on: 06/14/2024 03:31 PM   Modules accepted: Orders

## 2024-06-15 ENCOUNTER — Other Ambulatory Visit: Payer: Self-pay | Admitting: Cardiology

## 2024-06-17 NOTE — Telephone Encounter (Signed)
 Pt overdue for a F/U

## 2024-06-18 ENCOUNTER — Other Ambulatory Visit: Payer: Self-pay | Admitting: Hematology

## 2024-06-18 DIAGNOSIS — C50919 Malignant neoplasm of unspecified site of unspecified female breast: Secondary | ICD-10-CM

## 2024-06-19 ENCOUNTER — Encounter: Payer: Self-pay | Admitting: Hematology

## 2024-07-09 ENCOUNTER — Other Ambulatory Visit: Payer: Self-pay

## 2024-07-09 DIAGNOSIS — E876 Hypokalemia: Secondary | ICD-10-CM

## 2024-07-09 DIAGNOSIS — C50919 Malignant neoplasm of unspecified site of unspecified female breast: Secondary | ICD-10-CM

## 2024-07-10 ENCOUNTER — Inpatient Hospital Stay: Attending: Hematology

## 2024-07-18 ENCOUNTER — Inpatient Hospital Stay

## 2024-07-30 ENCOUNTER — Ambulatory Visit: Admitting: Podiatry

## 2024-08-07 ENCOUNTER — Inpatient Hospital Stay: Attending: Hematology | Admitting: Hematology

## 2024-08-07 ENCOUNTER — Inpatient Hospital Stay

## 2024-09-04 ENCOUNTER — Inpatient Hospital Stay: Attending: Hematology

## 2024-09-12 ENCOUNTER — Ambulatory Visit: Payer: Self-pay | Admitting: Family

## 2024-10-18 ENCOUNTER — Ambulatory Visit: Payer: Self-pay | Admitting: Family
